# Patient Record
Sex: Female | Born: 1945 | Race: Black or African American | Hispanic: No | Marital: Single | Smoking: Former smoker
Health system: Southern US, Community
[De-identification: ages and names within clinical notes are randomized; demographics above are authoritative.]

## PROBLEM LIST (undated history)

## (undated) DIAGNOSIS — C3491 Malignant neoplasm of unspecified part of right bronchus or lung: Secondary | ICD-10-CM

## (undated) DIAGNOSIS — E7800 Pure hypercholesterolemia, unspecified: Secondary | ICD-10-CM

## (undated) DIAGNOSIS — Z5189 Encounter for other specified aftercare: Secondary | ICD-10-CM

## (undated) DIAGNOSIS — K219 Gastro-esophageal reflux disease without esophagitis: Secondary | ICD-10-CM

## (undated) DIAGNOSIS — I10 Essential (primary) hypertension: Secondary | ICD-10-CM

## (undated) DIAGNOSIS — Z803 Family history of malignant neoplasm of breast: Secondary | ICD-10-CM

## (undated) DIAGNOSIS — K635 Polyp of colon: Secondary | ICD-10-CM

## (undated) DIAGNOSIS — I5041 Acute combined systolic (congestive) and diastolic (congestive) heart failure: Secondary | ICD-10-CM

## (undated) DIAGNOSIS — C801 Malignant (primary) neoplasm, unspecified: Secondary | ICD-10-CM

## (undated) DIAGNOSIS — I671 Cerebral aneurysm, nonruptured: Secondary | ICD-10-CM

## (undated) DIAGNOSIS — N61 Mastitis without abscess: Secondary | ICD-10-CM

## (undated) DIAGNOSIS — Z950 Presence of cardiac pacemaker: Secondary | ICD-10-CM

## (undated) DIAGNOSIS — D126 Benign neoplasm of colon, unspecified: Secondary | ICD-10-CM

## (undated) DIAGNOSIS — F09 Unspecified mental disorder due to known physiological condition: Secondary | ICD-10-CM

## (undated) DIAGNOSIS — M1990 Unspecified osteoarthritis, unspecified site: Secondary | ICD-10-CM

## (undated) DIAGNOSIS — J449 Chronic obstructive pulmonary disease, unspecified: Secondary | ICD-10-CM

## (undated) DIAGNOSIS — F0789 Other personality and behavioral disorders due to known physiological condition: Secondary | ICD-10-CM

## (undated) DIAGNOSIS — I214 Non-ST elevation (NSTEMI) myocardial infarction: Secondary | ICD-10-CM

## (undated) DIAGNOSIS — F17200 Nicotine dependence, unspecified, uncomplicated: Secondary | ICD-10-CM

## (undated) DIAGNOSIS — N189 Chronic kidney disease, unspecified: Secondary | ICD-10-CM

## (undated) DIAGNOSIS — F0390 Unspecified dementia without behavioral disturbance: Secondary | ICD-10-CM

## (undated) DIAGNOSIS — M5431 Sciatica, right side: Secondary | ICD-10-CM

## (undated) DIAGNOSIS — N184 Chronic kidney disease, stage 4 (severe): Secondary | ICD-10-CM

## (undated) DIAGNOSIS — J302 Other seasonal allergic rhinitis: Secondary | ICD-10-CM

## (undated) DIAGNOSIS — E559 Vitamin D deficiency, unspecified: Secondary | ICD-10-CM

## (undated) HISTORY — DX: Unspecified mental disorder due to known physiological condition: F09

## (undated) HISTORY — DX: Essential (primary) hypertension: I10

## (undated) HISTORY — DX: Nicotine dependence, unspecified, uncomplicated: F17.200

## (undated) HISTORY — DX: Pure hypercholesterolemia, unspecified: E78.00

## (undated) HISTORY — DX: Non-ST elevation (NSTEMI) myocardial infarction: I21.4

## (undated) HISTORY — DX: Family history of malignant neoplasm of breast: Z80.3

## (undated) HISTORY — DX: Chronic kidney disease, stage 4 (severe): N18.4

## (undated) HISTORY — DX: Sciatica, right side: M54.31

## (undated) HISTORY — DX: Malignant neoplasm of unspecified part of right bronchus or lung: C34.91

## (undated) HISTORY — DX: Cerebral aneurysm, nonruptured: I67.1

## (undated) HISTORY — DX: Other personality and behavioral disorders due to known physiological condition: F07.89

## (undated) HISTORY — DX: Other seasonal allergic rhinitis: J30.2

## (undated) HISTORY — DX: Mastitis without abscess: N61.0

## (undated) HISTORY — DX: Benign neoplasm of colon, unspecified: D12.6

## (undated) HISTORY — DX: Acute combined systolic (congestive) and diastolic (congestive) heart failure: I50.41

## (undated) HISTORY — DX: Unspecified dementia without behavioral disturbance: F03.90

## (undated) HISTORY — PX: BREAST SURGERY: SHX581

## (undated) HISTORY — DX: Polyp of colon: K63.5

## (undated) HISTORY — DX: Vitamin D deficiency, unspecified: E55.9

## (undated) HISTORY — DX: Gastro-esophageal reflux disease without esophagitis: K21.9

---

## 1999-09-15 ENCOUNTER — Other Ambulatory Visit: Admission: RE | Admit: 1999-09-15 | Discharge: 1999-09-15 | Payer: Self-pay | Admitting: Internal Medicine

## 1999-12-11 ENCOUNTER — Encounter: Payer: Self-pay | Admitting: Internal Medicine

## 1999-12-11 ENCOUNTER — Encounter: Admission: RE | Admit: 1999-12-11 | Discharge: 1999-12-11 | Payer: Self-pay | Admitting: Internal Medicine

## 2000-09-16 ENCOUNTER — Other Ambulatory Visit: Admission: RE | Admit: 2000-09-16 | Discharge: 2000-09-16 | Payer: Self-pay | Admitting: Internal Medicine

## 2000-12-11 ENCOUNTER — Encounter: Payer: Self-pay | Admitting: Internal Medicine

## 2000-12-11 ENCOUNTER — Encounter: Admission: RE | Admit: 2000-12-11 | Discharge: 2000-12-11 | Payer: Self-pay | Admitting: Internal Medicine

## 2001-12-12 ENCOUNTER — Encounter: Payer: Self-pay | Admitting: Internal Medicine

## 2001-12-12 ENCOUNTER — Encounter: Admission: RE | Admit: 2001-12-12 | Discharge: 2001-12-12 | Payer: Self-pay | Admitting: Internal Medicine

## 2002-12-14 ENCOUNTER — Encounter: Payer: Self-pay | Admitting: Internal Medicine

## 2002-12-14 ENCOUNTER — Encounter: Admission: RE | Admit: 2002-12-14 | Discharge: 2002-12-14 | Payer: Self-pay | Admitting: Internal Medicine

## 2003-10-06 DIAGNOSIS — N61 Mastitis without abscess: Secondary | ICD-10-CM

## 2003-10-06 HISTORY — DX: Mastitis without abscess: N61.0

## 2003-10-18 ENCOUNTER — Encounter: Admission: RE | Admit: 2003-10-18 | Discharge: 2003-10-18 | Payer: Self-pay | Admitting: Internal Medicine

## 2003-11-01 ENCOUNTER — Encounter: Admission: RE | Admit: 2003-11-01 | Discharge: 2003-11-01 | Payer: Self-pay | Admitting: Internal Medicine

## 2003-11-11 ENCOUNTER — Encounter: Admission: RE | Admit: 2003-11-11 | Discharge: 2003-11-11 | Payer: Self-pay | Admitting: Internal Medicine

## 2004-12-15 ENCOUNTER — Encounter: Admission: RE | Admit: 2004-12-15 | Discharge: 2004-12-15 | Payer: Self-pay | Admitting: Internal Medicine

## 2004-12-28 ENCOUNTER — Encounter: Admission: RE | Admit: 2004-12-28 | Discharge: 2004-12-28 | Payer: Self-pay | Admitting: Internal Medicine

## 2005-12-14 ENCOUNTER — Other Ambulatory Visit: Admission: RE | Admit: 2005-12-14 | Discharge: 2005-12-14 | Payer: Self-pay | Admitting: Internal Medicine

## 2006-01-14 ENCOUNTER — Encounter: Admission: RE | Admit: 2006-01-14 | Discharge: 2006-01-14 | Payer: Self-pay | Admitting: Internal Medicine

## 2007-02-24 ENCOUNTER — Other Ambulatory Visit: Admission: RE | Admit: 2007-02-24 | Discharge: 2007-02-24 | Payer: Self-pay | Admitting: Internal Medicine

## 2007-09-03 ENCOUNTER — Encounter: Admission: RE | Admit: 2007-09-03 | Payer: Self-pay | Admitting: Internal Medicine

## 2008-09-06 ENCOUNTER — Encounter: Admission: RE | Admit: 2008-09-06 | Discharge: 2008-09-06 | Payer: Self-pay | Admitting: Internal Medicine

## 2009-03-02 ENCOUNTER — Other Ambulatory Visit: Admission: RE | Admit: 2009-03-02 | Discharge: 2009-03-02 | Payer: Self-pay | Admitting: Internal Medicine

## 2009-09-07 ENCOUNTER — Encounter: Admission: RE | Admit: 2009-09-07 | Discharge: 2009-09-07 | Payer: Self-pay | Admitting: Internal Medicine

## 2009-09-13 ENCOUNTER — Encounter: Admission: RE | Admit: 2009-09-13 | Discharge: 2009-09-13 | Payer: Self-pay | Admitting: Internal Medicine

## 2010-06-12 ENCOUNTER — Encounter: Admission: RE | Admit: 2010-06-12 | Discharge: 2010-06-12 | Payer: Self-pay | Admitting: Gastroenterology

## 2012-02-26 ENCOUNTER — Other Ambulatory Visit: Payer: Self-pay | Admitting: Internal Medicine

## 2012-02-26 ENCOUNTER — Other Ambulatory Visit (HOSPITAL_COMMUNITY)
Admission: RE | Admit: 2012-02-26 | Discharge: 2012-02-26 | Disposition: A | Discharge status: Home / Self Care | Payer: Federal, State, Local not specified - PPO | Source: Ambulatory Visit | Attending: Internal Medicine | Admitting: Internal Medicine

## 2012-02-26 DIAGNOSIS — F17200 Nicotine dependence, unspecified, uncomplicated: Secondary | ICD-10-CM | POA: Diagnosis not present

## 2012-02-26 DIAGNOSIS — Z0000 Encounter for general adult medical examination without abnormal findings: Secondary | ICD-10-CM | POA: Diagnosis not present

## 2012-02-26 DIAGNOSIS — Z86010 Personal history of colonic polyps: Secondary | ICD-10-CM | POA: Diagnosis not present

## 2012-02-26 DIAGNOSIS — E7800 Pure hypercholesterolemia, unspecified: Secondary | ICD-10-CM | POA: Diagnosis not present

## 2012-02-26 DIAGNOSIS — J309 Allergic rhinitis, unspecified: Secondary | ICD-10-CM | POA: Diagnosis not present

## 2012-02-26 DIAGNOSIS — E559 Vitamin D deficiency, unspecified: Secondary | ICD-10-CM | POA: Diagnosis not present

## 2012-02-26 DIAGNOSIS — Z01419 Encounter for gynecological examination (general) (routine) without abnormal findings: Secondary | ICD-10-CM | POA: Insufficient documentation

## 2012-02-26 DIAGNOSIS — Z79899 Other long term (current) drug therapy: Secondary | ICD-10-CM | POA: Diagnosis not present

## 2012-02-26 DIAGNOSIS — Z1159 Encounter for screening for other viral diseases: Secondary | ICD-10-CM | POA: Insufficient documentation

## 2012-02-26 DIAGNOSIS — I10 Essential (primary) hypertension: Secondary | ICD-10-CM | POA: Diagnosis not present

## 2013-03-03 DIAGNOSIS — J309 Allergic rhinitis, unspecified: Secondary | ICD-10-CM | POA: Diagnosis not present

## 2013-03-03 DIAGNOSIS — E559 Vitamin D deficiency, unspecified: Secondary | ICD-10-CM | POA: Diagnosis not present

## 2013-03-03 DIAGNOSIS — E7800 Pure hypercholesterolemia, unspecified: Secondary | ICD-10-CM | POA: Diagnosis not present

## 2013-03-03 DIAGNOSIS — F17200 Nicotine dependence, unspecified, uncomplicated: Secondary | ICD-10-CM | POA: Diagnosis not present

## 2013-03-03 DIAGNOSIS — Z0000 Encounter for general adult medical examination without abnormal findings: Secondary | ICD-10-CM | POA: Diagnosis not present

## 2013-03-03 DIAGNOSIS — Z1331 Encounter for screening for depression: Secondary | ICD-10-CM | POA: Diagnosis not present

## 2013-03-03 DIAGNOSIS — Z86010 Personal history of colonic polyps: Secondary | ICD-10-CM | POA: Diagnosis not present

## 2013-03-03 DIAGNOSIS — Z79899 Other long term (current) drug therapy: Secondary | ICD-10-CM | POA: Diagnosis not present

## 2013-03-03 DIAGNOSIS — I10 Essential (primary) hypertension: Secondary | ICD-10-CM | POA: Diagnosis not present

## 2013-05-06 DIAGNOSIS — M25559 Pain in unspecified hip: Secondary | ICD-10-CM | POA: Diagnosis not present

## 2014-03-09 ENCOUNTER — Other Ambulatory Visit: Payer: Self-pay | Admitting: Internal Medicine

## 2014-03-09 ENCOUNTER — Ambulatory Visit
Admission: RE | Admit: 2014-03-09 | Discharge: 2014-03-09 | Disposition: A | Discharge status: Home / Self Care | Payer: Medicare Other | Source: Ambulatory Visit | Attending: Internal Medicine | Admitting: Internal Medicine

## 2014-03-09 DIAGNOSIS — J988 Other specified respiratory disorders: Secondary | ICD-10-CM | POA: Diagnosis not present

## 2014-03-09 DIAGNOSIS — Z79899 Other long term (current) drug therapy: Secondary | ICD-10-CM | POA: Diagnosis not present

## 2014-03-09 DIAGNOSIS — F17200 Nicotine dependence, unspecified, uncomplicated: Secondary | ICD-10-CM

## 2014-03-09 DIAGNOSIS — J309 Allergic rhinitis, unspecified: Secondary | ICD-10-CM | POA: Diagnosis not present

## 2014-03-09 DIAGNOSIS — Z86010 Personal history of colonic polyps: Secondary | ICD-10-CM | POA: Diagnosis not present

## 2014-03-09 DIAGNOSIS — Z1331 Encounter for screening for depression: Secondary | ICD-10-CM | POA: Diagnosis not present

## 2014-03-09 DIAGNOSIS — I10 Essential (primary) hypertension: Secondary | ICD-10-CM | POA: Diagnosis not present

## 2014-03-09 DIAGNOSIS — E559 Vitamin D deficiency, unspecified: Secondary | ICD-10-CM | POA: Diagnosis not present

## 2014-03-09 DIAGNOSIS — J398 Other specified diseases of upper respiratory tract: Secondary | ICD-10-CM | POA: Diagnosis not present

## 2014-03-09 DIAGNOSIS — Z0000 Encounter for general adult medical examination without abnormal findings: Secondary | ICD-10-CM | POA: Diagnosis not present

## 2014-03-09 DIAGNOSIS — E7800 Pure hypercholesterolemia, unspecified: Secondary | ICD-10-CM | POA: Diagnosis not present

## 2014-06-16 DIAGNOSIS — F17200 Nicotine dependence, unspecified, uncomplicated: Secondary | ICD-10-CM | POA: Diagnosis not present

## 2014-06-16 DIAGNOSIS — I10 Essential (primary) hypertension: Secondary | ICD-10-CM | POA: Diagnosis not present

## 2014-06-16 DIAGNOSIS — N183 Chronic kidney disease, stage 3 unspecified: Secondary | ICD-10-CM | POA: Diagnosis not present

## 2014-06-16 DIAGNOSIS — E7800 Pure hypercholesterolemia, unspecified: Secondary | ICD-10-CM | POA: Diagnosis not present

## 2014-06-18 ENCOUNTER — Other Ambulatory Visit: Payer: Self-pay | Admitting: Internal Medicine

## 2014-06-18 DIAGNOSIS — N183 Chronic kidney disease, stage 3 unspecified: Secondary | ICD-10-CM

## 2014-06-23 ENCOUNTER — Ambulatory Visit
Admission: RE | Admit: 2014-06-23 | Discharge: 2014-06-23 | Disposition: A | Discharge status: Home / Self Care | Payer: Medicare Other | Source: Ambulatory Visit | Attending: Internal Medicine | Admitting: Internal Medicine

## 2014-06-23 ENCOUNTER — Encounter (INDEPENDENT_AMBULATORY_CARE_PROVIDER_SITE_OTHER): Payer: Self-pay

## 2014-06-23 DIAGNOSIS — N183 Chronic kidney disease, stage 3 unspecified: Secondary | ICD-10-CM

## 2014-06-23 DIAGNOSIS — N281 Cyst of kidney, acquired: Secondary | ICD-10-CM | POA: Diagnosis not present

## 2014-07-20 DIAGNOSIS — E119 Type 2 diabetes mellitus without complications: Secondary | ICD-10-CM | POA: Diagnosis not present

## 2014-07-20 DIAGNOSIS — R809 Proteinuria, unspecified: Secondary | ICD-10-CM | POA: Diagnosis not present

## 2014-07-20 DIAGNOSIS — N184 Chronic kidney disease, stage 4 (severe): Secondary | ICD-10-CM | POA: Diagnosis not present

## 2014-07-20 DIAGNOSIS — M109 Gout, unspecified: Secondary | ICD-10-CM | POA: Diagnosis not present

## 2014-07-20 DIAGNOSIS — D649 Anemia, unspecified: Secondary | ICD-10-CM | POA: Diagnosis not present

## 2014-07-20 DIAGNOSIS — I10 Essential (primary) hypertension: Secondary | ICD-10-CM | POA: Diagnosis not present

## 2014-07-20 DIAGNOSIS — R300 Dysuria: Secondary | ICD-10-CM | POA: Diagnosis not present

## 2014-07-20 DIAGNOSIS — N189 Chronic kidney disease, unspecified: Secondary | ICD-10-CM | POA: Diagnosis not present

## 2014-07-29 DIAGNOSIS — I10 Essential (primary) hypertension: Secondary | ICD-10-CM | POA: Diagnosis not present

## 2014-09-01 DIAGNOSIS — N184 Chronic kidney disease, stage 4 (severe): Secondary | ICD-10-CM | POA: Diagnosis not present

## 2014-09-01 DIAGNOSIS — Z23 Encounter for immunization: Secondary | ICD-10-CM | POA: Diagnosis not present

## 2014-09-01 DIAGNOSIS — N189 Chronic kidney disease, unspecified: Secondary | ICD-10-CM | POA: Diagnosis not present

## 2014-09-01 DIAGNOSIS — M109 Gout, unspecified: Secondary | ICD-10-CM | POA: Diagnosis not present

## 2014-09-01 DIAGNOSIS — D649 Anemia, unspecified: Secondary | ICD-10-CM | POA: Diagnosis not present

## 2014-09-01 DIAGNOSIS — I10 Essential (primary) hypertension: Secondary | ICD-10-CM | POA: Diagnosis not present

## 2014-09-01 DIAGNOSIS — E780 Pure hypercholesterolemia: Secondary | ICD-10-CM | POA: Diagnosis not present

## 2014-09-16 DIAGNOSIS — F17211 Nicotine dependence, cigarettes, in remission: Secondary | ICD-10-CM | POA: Diagnosis not present

## 2014-09-16 DIAGNOSIS — N183 Chronic kidney disease, stage 3 (moderate): Secondary | ICD-10-CM | POA: Diagnosis not present

## 2014-09-16 DIAGNOSIS — I10 Essential (primary) hypertension: Secondary | ICD-10-CM | POA: Diagnosis not present

## 2014-09-16 DIAGNOSIS — E780 Pure hypercholesterolemia: Secondary | ICD-10-CM | POA: Diagnosis not present

## 2014-09-17 DIAGNOSIS — E780 Pure hypercholesterolemia: Secondary | ICD-10-CM | POA: Diagnosis not present

## 2014-11-10 DIAGNOSIS — Z5181 Encounter for therapeutic drug level monitoring: Secondary | ICD-10-CM | POA: Diagnosis not present

## 2015-03-23 ENCOUNTER — Telehealth: Payer: Self-pay | Admitting: Acute Care

## 2015-03-23 ENCOUNTER — Encounter: Payer: Self-pay | Admitting: Acute Care

## 2015-03-23 DIAGNOSIS — Z23 Encounter for immunization: Secondary | ICD-10-CM | POA: Diagnosis not present

## 2015-03-23 DIAGNOSIS — F17211 Nicotine dependence, cigarettes, in remission: Secondary | ICD-10-CM | POA: Diagnosis not present

## 2015-03-23 DIAGNOSIS — I10 Essential (primary) hypertension: Secondary | ICD-10-CM | POA: Diagnosis not present

## 2015-03-23 DIAGNOSIS — E782 Mixed hyperlipidemia: Secondary | ICD-10-CM | POA: Diagnosis not present

## 2015-03-23 DIAGNOSIS — N183 Chronic kidney disease, stage 3 (moderate): Secondary | ICD-10-CM | POA: Diagnosis not present

## 2015-03-23 DIAGNOSIS — E559 Vitamin D deficiency, unspecified: Secondary | ICD-10-CM | POA: Diagnosis not present

## 2015-03-23 DIAGNOSIS — Z79899 Other long term (current) drug therapy: Secondary | ICD-10-CM | POA: Diagnosis not present

## 2015-03-23 DIAGNOSIS — Z0001 Encounter for general adult medical examination with abnormal findings: Secondary | ICD-10-CM | POA: Diagnosis not present

## 2015-03-23 DIAGNOSIS — K635 Polyp of colon: Secondary | ICD-10-CM | POA: Diagnosis not present

## 2015-03-23 DIAGNOSIS — Z1389 Encounter for screening for other disorder: Secondary | ICD-10-CM | POA: Diagnosis not present

## 2015-03-23 NOTE — Telephone Encounter (Signed)
Called and left a message for Betty [name] to call me so that we can schedule her LDCT scan. I left my contact information on her answering machine.Referral per [name].

## 2015-03-24 ENCOUNTER — Telehealth: Payer: Self-pay | Admitting: Acute Care

## 2015-03-24 NOTE — Telephone Encounter (Signed)
I have called and left messages on the answering machine for Betty [name] to call me back so that we can get her scheduled for her lung cancer screening both yesterday 03/23/15, and today, 03/24/15. This patient was referred by Dwaine Deter.I will await her return call.

## 2015-03-25 DIAGNOSIS — Z803 Family history of malignant neoplasm of breast: Secondary | ICD-10-CM | POA: Diagnosis not present

## 2015-03-25 DIAGNOSIS — Z1231 Encounter for screening mammogram for malignant neoplasm of breast: Secondary | ICD-10-CM | POA: Diagnosis not present

## 2015-03-28 ENCOUNTER — Other Ambulatory Visit: Payer: Self-pay | Admitting: Acute Care

## 2015-03-28 ENCOUNTER — Encounter: Payer: Self-pay | Admitting: Acute Care

## 2015-03-28 ENCOUNTER — Telehealth: Payer: Self-pay | Admitting: Acute Care

## 2015-03-28 DIAGNOSIS — Z87891 Personal history of nicotine dependence: Secondary | ICD-10-CM

## 2015-03-28 NOTE — Telephone Encounter (Signed)
I have spoken with Betty [name], and we have scheduled her for a shared decision making visit on June 7th, 2016 at 1000 am. She will then go for a CT scan at 11:00 at the [facility].She verbalized understanding of the time and location of the appointments. I have given her my contact information in the event she needs to get in touch with me.

## 2015-03-29 ENCOUNTER — Other Ambulatory Visit: Payer: Self-pay | Admitting: Internal Medicine

## 2015-03-29 DIAGNOSIS — Z86010 Personal history of colonic polyps: Secondary | ICD-10-CM

## 2015-04-01 DIAGNOSIS — R921 Mammographic calcification found on diagnostic imaging of breast: Secondary | ICD-10-CM | POA: Diagnosis not present

## 2015-04-05 ENCOUNTER — Other Ambulatory Visit: Payer: Self-pay | Admitting: Radiology

## 2015-04-05 DIAGNOSIS — D242 Benign neoplasm of left breast: Secondary | ICD-10-CM | POA: Diagnosis not present

## 2015-04-05 DIAGNOSIS — Z0000 Encounter for general adult medical examination without abnormal findings: Secondary | ICD-10-CM | POA: Diagnosis not present

## 2015-04-05 DIAGNOSIS — R920 Mammographic microcalcification found on diagnostic imaging of breast: Secondary | ICD-10-CM | POA: Diagnosis not present

## 2015-04-12 ENCOUNTER — Other Ambulatory Visit: Payer: Self-pay | Admitting: Acute Care

## 2015-04-12 ENCOUNTER — Telehealth: Payer: Self-pay | Admitting: Acute Care

## 2015-04-12 ENCOUNTER — Ambulatory Visit (INDEPENDENT_AMBULATORY_CARE_PROVIDER_SITE_OTHER): Payer: Medicare Other | Admitting: Acute Care

## 2015-04-12 ENCOUNTER — Ambulatory Visit (INDEPENDENT_AMBULATORY_CARE_PROVIDER_SITE_OTHER)
Admission: RE | Admit: 2015-04-12 | Discharge: 2015-04-12 | Disposition: A | Discharge status: Home / Self Care | Payer: Medicare Other | Source: Ambulatory Visit | Attending: Acute Care | Admitting: Acute Care

## 2015-04-12 ENCOUNTER — Encounter: Payer: Self-pay | Admitting: Acute Care

## 2015-04-12 DIAGNOSIS — Z87891 Personal history of nicotine dependence: Secondary | ICD-10-CM | POA: Diagnosis not present

## 2015-04-12 DIAGNOSIS — R918 Other nonspecific abnormal finding of lung field: Secondary | ICD-10-CM

## 2015-04-12 NOTE — Progress Notes (Signed)
Shared Decision Making Visit Lung Cancer Screening Program [phone])   Eligibility:  Age 69 y.o.  Pack Years Smoking History Calculation  (# packs/per year x # years smoked)  Recent History of coughing up blood  no  Unexplained weight loss? no ( >Than 15 pounds within the last 6 months )  Prior History Lung / other cancer no (Diagnosis within the last 5 years already requiring surveillance chest CT Scans).  Smoking Status Current Smoker  Former Smokers: Years since quit: N/A  Quit Date: N/A  Visit Components:  Discussion included one or more decision making aids. yes  Discussion included risk/benefits of screening. yes  Discussion included potential follow up diagnostic testing for abnormal scans. yes  Discussion included meaning and risk of over diagnosis. yes  Discussion included meaning and risk of False Positives. yes  Discussion included meaning of total radiation exposure. yes  Counseling Included:  Importance of adherence to annual lung cancer LDCT screening. yes  Impact of comorbidities on ability to participate in the program. yes  Ability and willingness to under diagnostic treatment. yes  Smoking Cessation Counseling:  Current Smokers:   Discussed importance of smoking cessation. yes  Information about tobacco cessation classes and interventions provided to patient. yes  Patient provided with "ticket" for LDCT Scan. no  Symptomatic Patient. no  Counseling N/A  Diagnosis Code: Tobacco Use Z72.0  Asymptomatic Patient yes  Counseling (Intermediate counseling: > three minutes counseling) [id number]  Former Smokers:   Discussed the importance of maintaining cigarette abstinence.N/A  Diagnosis Code: Personal History of Nicotine Dependence. Q22.979  Information about tobacco cessation classes and interventions provided to patient. N/A  Patient provided with "ticket" for LDCT Scan. N/A  Written Order for Lung Cancer Screening with LDCT placed in  Epic. Yes (CT Chest Lung Cancer Screening Low Dose W/O CM) [id number] Z12.2-Screening of respiratory organs Z87.891-Personal history of nicotine dependence  I spent 20 minutes of face to face time with this patient discussing the risks and benefits of lung cancer screening. The patient is a current smoker and we discussed that the single most powerful action she can take to decrease her risk of lung cancer is to quit smoking.I gave her the " be stronger than your excuses  "card, and told her I would do anything I could to help her achieve this goal when she is ready. She is not ready now, but says that when cigarettes are $50.00 a case she has told herself she will quit then.She has my contact information for when she is ready.We viewed a power point about lung cancer screening and the risks and benefits. We stopped at intervals to discuss points and answer questions as needed. Ms. Gilcrest verbalized that she had no further questions.She does have my card and contact info if she has a question in the future. She is on her way to [place] at [facility] for her scan.She verbalized understanding of both the time and location of the appointment.I told her I will call her with the results of her scan.She verbalized understanding.  Magdalen Spatz, NP

## 2015-04-12 NOTE — Telephone Encounter (Signed)
I have called and spoken with Betty [name]. I told her that there is a suspicious area on her Low Dose CT Scan. She verbalized understanding. We had discussed during the shared decision making visit that often time the next step in diagnosis is a PET scan. I told her I have ordered this scan, and she verbalized understanding. She is scheduled for this Thursday morning 04/14/15 at 07:00. She knows to be there at 6:30 and to have nothing to eat or drink after midnight on 04/13/15. I have told her to go to the radiology department at [facility] and gave her directions. She verbalized understanding.I have discussed this case with Dr. Lamonte Sakai. We will also need PFT's in the near future to evaluate her for surgery if the PET comes back as positive.I called Dr. [name]' office to see if he wanted to be the one to tell the patient there is a suspicious area on her scan, but was told he is out of town until the 14th of the month. Due to the fact this is a 4B nodule, we will proceed in order to provide the patient with the best possible care. Betty [name] has my contact information if she has any further questions or needs.

## 2015-04-14 ENCOUNTER — Telehealth: Payer: Self-pay | Admitting: Acute Care

## 2015-04-14 ENCOUNTER — Other Ambulatory Visit: Payer: Self-pay | Admitting: Acute Care

## 2015-04-14 ENCOUNTER — Ambulatory Visit (HOSPITAL_COMMUNITY)
Admission: RE | Admit: 2015-04-14 | Discharge: 2015-04-14 | Disposition: A | Discharge status: Home / Self Care | Payer: Medicare Other | Source: Ambulatory Visit | Attending: Acute Care | Admitting: Acute Care

## 2015-04-14 DIAGNOSIS — I700 Atherosclerosis of aorta: Secondary | ICD-10-CM | POA: Diagnosis not present

## 2015-04-14 DIAGNOSIS — J439 Emphysema, unspecified: Secondary | ICD-10-CM | POA: Diagnosis not present

## 2015-04-14 DIAGNOSIS — R918 Other nonspecific abnormal finding of lung field: Secondary | ICD-10-CM | POA: Insufficient documentation

## 2015-04-14 DIAGNOSIS — N281 Cyst of kidney, acquired: Secondary | ICD-10-CM | POA: Insufficient documentation

## 2015-04-14 DIAGNOSIS — K8020 Calculus of gallbladder without cholecystitis without obstruction: Secondary | ICD-10-CM | POA: Insufficient documentation

## 2015-04-14 DIAGNOSIS — K449 Diaphragmatic hernia without obstruction or gangrene: Secondary | ICD-10-CM | POA: Insufficient documentation

## 2015-04-14 DIAGNOSIS — R911 Solitary pulmonary nodule: Secondary | ICD-10-CM

## 2015-04-14 DIAGNOSIS — Z87891 Personal history of nicotine dependence: Secondary | ICD-10-CM

## 2015-04-14 LAB — GLUCOSE, CAPILLARY: Glucose-Capillary: 113 mg/dL — ABNORMAL HIGH (ref 65–99)

## 2015-04-14 MED ORDER — FLUDEOXYGLUCOSE F - 18 (FDG) INJECTION
5.8000 | Freq: Once | INTRAVENOUS | Status: AC | PRN
  Administered 2015-04-14: 5.8 via INTRAVENOUS

## 2015-04-14 NOTE — Addendum Note (Signed)
Addended by: Maurice March on: 04/14/2015 04:44 PM   Modules accepted: Orders

## 2015-04-14 NOTE — Telephone Encounter (Signed)
PET scan results are back. I attempted to call Ms. Betty [name] to discuss them with her. I have left a message with my number, asking that she return my call as soon as she gets my message.I did not leave any other information in the message. I will await her return call.

## 2015-04-14 NOTE — Telephone Encounter (Signed)
I spoke with Ms. Betty [name]. I told her that the nodule she has in the Right Lower Lobe of her lung is suspicious for cancer based on the PET scan results.. She verbalized understanding. 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 I explained that the next step in the process is to have pulmonary function tests done to clear her for surgery if needed. I have scheduled her for Tuesday June 14th at 10 am at [facility]. She verbalized understanding of time and location of the appointment. We reviewed that she must not smoke or have caffeine or use any breathing medicines for 4 hours prior to the procedure.( None after 6  Am).She verbalized understanding. I am going to refer her to [facility] for an appointment with then as early as possible to evaluate her for surgical removal of the suspicious node. She understands that I am doing this, and understands that we want to move forward at a quick pace which we believe is in her best interest. I conferred with Dr. Lamonte Sakai on both the CT and PET scan results. I have faxed a copy of the CT report to Betty Finders, MD ( PCP), and will do the same with the PET scan results. [name] has my contact information if she has any questions, she can call me at any time.

## 2015-04-19 ENCOUNTER — Ambulatory Visit (HOSPITAL_COMMUNITY)
Admission: RE | Admit: 2015-04-19 | Discharge: 2015-04-19 | Disposition: A | Discharge status: Home / Self Care | Payer: Medicare Other | Source: Ambulatory Visit | Attending: Acute Care | Admitting: Acute Care

## 2015-04-19 DIAGNOSIS — R911 Solitary pulmonary nodule: Secondary | ICD-10-CM | POA: Diagnosis not present

## 2015-04-19 DIAGNOSIS — Z87891 Personal history of nicotine dependence: Secondary | ICD-10-CM | POA: Insufficient documentation

## 2015-04-19 LAB — PULMONARY FUNCTION TEST
DL/VA % pred: 42 %
DL/VA: 2.02 ml/min/mmHg/L
DLCO unc % pred: 39 %
DLCO unc: 9.21 ml/min/mmHg
FEF 25-75 POST: 1.29 L/s
FEF 25-75 Pre: 1.34 L/sec
FEF2575-%CHANGE-POST: -4 %
FEF2575-%Pred-Post: 78 %
FEF2575-%Pred-Pre: 82 %
FEV1-%Change-Post: 0 %
FEV1-%Pred-Post: 116 %
FEV1-%Pred-Pre: 115 %
FEV1-Post: 2.08 L
FEV1-Pre: 2.06 L
FEV1FVC-%Change-Post: -2 %
FEV1FVC-%Pred-Pre: 91 %
FEV6-%Change-Post: 2 %
FEV6-%Pred-Post: 135 %
FEV6-%Pred-Pre: 131 %
FEV6-Post: 2.98 L
FEV6-Pre: 2.89 L
FEV6FVC-%Change-Post: -1 %
FEV6FVC-%Pred-Post: 101 %
FEV6FVC-%Pred-Pre: 103 %
FVC-%Change-Post: 3 %
FVC-%Pred-Post: 132 %
FVC-%Pred-Pre: 127 %
FVC-PRE: 2.93 L
FVC-Post: 3.04 L
POST FEV1/FVC RATIO: 68 %
PRE FEV1/FVC RATIO: 70 %
Post FEV6/FVC ratio: 98 %
Pre FEV6/FVC Ratio: 99 %
RV % pred: 87 %
RV: 1.9 L
TLC % pred: 99 %
TLC: 4.94 L

## 2015-04-19 MED ORDER — ALBUTEROL SULFATE (2.5 MG/3ML) 0.083% IN NEBU
2.5000 mg | INHALATION_SOLUTION | Freq: Once | RESPIRATORY_TRACT | Status: AC
  Administered 2015-04-19: 2.5 mg via RESPIRATORY_TRACT

## 2015-04-22 ENCOUNTER — Other Ambulatory Visit: Payer: Self-pay

## 2015-04-22 ENCOUNTER — Institutional Professional Consult (permissible substitution) (INDEPENDENT_AMBULATORY_CARE_PROVIDER_SITE_OTHER): Payer: Medicare Other | Admitting: Thoracic Surgery (Cardiothoracic Vascular Surgery)

## 2015-04-22 VITALS — BP 134/75 | HR 92 | Resp 16 | Ht 63.5 in | Wt 124.0 lb

## 2015-04-22 DIAGNOSIS — R911 Solitary pulmonary nodule: Secondary | ICD-10-CM | POA: Diagnosis not present

## 2015-04-22 DIAGNOSIS — D381 Neoplasm of uncertain behavior of trachea, bronchus and lung: Secondary | ICD-10-CM | POA: Diagnosis not present

## 2015-04-22 NOTE — Progress Notes (Signed)
PCP is Henrine Screws, MD Referring Provider is Magdalen Spatz, NP  Chief Complaint  Patient presents with  . Lung Lesion    CT CHEST 04/12/15, ET 04/14/15    HPI: 69 year old woman with a long history of tobacco abuse who presents for evaluation of a lung nodule found on low-dose screening CT scan.  Mrs. Kittler is a 68 year old woman with a history of hypertension, hyperlipidemia, reflux, and tobacco abuse. She started smoking at age 61 and has smoked about 3/4 of a pack of cigarettes per day since then. She recently saw Dr. Inda Merlin who recommended a low-dose screening CT. That was done on 04/14/2015 and showed a 2.7 x 1.6 cm spiculated nodule in the right lower lobe adjacent to the spine. A PET CT was done and the lesion was hypermetabolic with an SUV of 8.7. There was an irregular area in the posterior aspect of the right upper lobe that was groundglass in nature and not a distinct nodule. There was some mild hypermetabolism in that area with an SUV of 1.5. There was no evidence of regional or distant metastatic disease.  Mrs. Whitehair says that she feels well. She has an occasional dry cough, but nothing out of the ordinary. She gets short of breath with heavy exertion, but can walk up a flight of stairs without difficulty. She denies any change in appetite or weight loss. She lives alone and is fully independent. She has been retired for 14 years. She's not had any unusual headaches or visual changes. She denies any chest pain, pressure, or tightness with exertion.  Zubrod Score: At the time of surgery this patient's most appropriate activity status/level should be described as: '[x]'$     0    Normal activity, no symptoms '[]'$     1    Restricted in physical strenuous activity but ambulatory, able to do out light work '[]'$     2    Ambulatory and capable of self care, unable to do work activities, up and about >50 % of waking hours                              '[]'$     3    Only limited self care, in bed  greater than 50% of waking hours '[]'$     4    Completely disabled, no self care, confined to bed or chair '[]'$     5    Moribund      Past Medical History  Diagnosis Date  . Hypertension   . Hypercholesterolemia   . Tobacco dependence   . GERD (gastroesophageal reflux disease)   . Sciatica of right side   . Seasonal allergic rhinitis   . Acute mastitis of right breast     10/2003  . Family history of breast cancer   . Colon polyps   . Tubular adenoma of colon   . Vitamin D deficiency     No past surgical history on file.  Family History  Problem Relation Age of Onset  . Cancer Father   . Cancer Sister     Social History History  Substance Use Topics  . Smoking status: Current Every Day Smoker -- 0.75 packs/day for 47 years    Types: Cigarettes  . Smokeless tobacco: Not on file     Comment: States she will quit when cigarettes cost $50.00 per carton  . Alcohol Use: Not on file    Current Outpatient Prescriptions  Medication Sig Dispense Refill  . loratadine (CLARITIN) 10 MG tablet Take 10 mg by mouth daily as needed.     . Multiple Vitamins-Minerals (MULTIVITAMIN PO) Take 1 tablet by mouth daily.    Marland Kitchen aspirin 81 MG tablet Take 81 mg by mouth daily.    . cholecalciferol (VITAMIN D) 1000 UNITS tablet Take 1,000 Units by mouth daily.    Marland Kitchen lisinopril-hydrochlorothiazide (PRINZIDE,ZESTORETIC) 20-12.5 MG per tablet Take 1 tablet by mouth daily.     . metoprolol succinate (TOPROL-XL) 50 MG 24 hr tablet Take 50 mg by mouth daily.     . simvastatin (ZOCOR) 20 MG tablet Take 20 mg by mouth daily.     No current facility-administered medications for this visit.    Allergies  Allergen Reactions  . Sulfa Antibiotics Itching    Review of Systems  Constitutional: Negative for fever, chills, activity change, appetite change and unexpected weight change.  Eyes: Negative for visual disturbance.  Respiratory: Positive for cough (nonproductive) and shortness of breath (with heavy  exertion). Negative for chest tightness and wheezing.   Cardiovascular: Negative for chest pain, palpitations and leg swelling.  Gastrointestinal: Negative.  Negative for abdominal pain and blood in stool.  Endocrine: Negative for polydipsia and polyphagia.  Genitourinary: Negative for hematuria.       "kidney disease" due to BP meds  Neurological: Negative for dizziness, seizures and syncope.  Hematological: Negative for adenopathy. Does not bruise/bleed easily.  All other systems reviewed and are negative.   BP 134/75 mmHg  Pulse 92  Resp 16  Ht 5' 3.5" (1.613 m)  Wt 124 lb (56.246 kg)  BMI 21.62 kg/m2  SpO2 98% Physical Exam  Constitutional: She is oriented to person, place, and time. She appears well-developed and well-nourished. No distress.  HENT:  Head: Normocephalic and atraumatic.  Eyes: EOM are normal. Pupils are equal, round, and reactive to light.  Neck: Neck supple. No thyromegaly present.  Left carotid bruit  Cardiovascular: Normal rate, regular rhythm, normal heart sounds and intact distal pulses.  Exam reveals no gallop and no friction rub.   No murmur heard. Pulmonary/Chest: Effort normal and breath sounds normal. She has no wheezes. She has no rales.  Abdominal: Soft. There is no tenderness.  Musculoskeletal: Normal range of motion. She exhibits no edema.  Lymphadenopathy:    She has no cervical adenopathy.  Neurological: She is alert and oriented to person, place, and time. No cranial nerve deficit.  Motor intact  Skin: Skin is warm and dry.  Psychiatric: She has a normal mood and affect.  Vitals reviewed.    Diagnostic Tests: CT CHEST WITHOUT CONTRAST  TECHNIQUE: Multidetector CT imaging of the chest was performed following the standard protocol without IV contrast.  COMPARISON: No priors.  FINDINGS: Mediastinum/Lymph Nodes: Heart size is normal. There is no significant pericardial fluid, thickening or pericardial calcification. There is  atherosclerosis of the thoracic aorta, the great vessels of the mediastinum and the coronary arteries, including calcified atherosclerotic plaque in the left anterior descending, left circumflex and the right coronary arteries. No pathologically enlarged mediastinal or hilar lymph nodes. Please note that accurate exclusion of hilar adenopathy is limited on noncontrast CT scans. Small hiatal hernia. No axillary lymphadenopathy.  Lungs/Pleura: In the medial aspect of the right lower lobe (image 266 of series 5) there is a macrolobulated and spiculated nodule that has a volume derived mean diameter of 20.8 mm, concerning for potential neoplasm. Several other smaller pulmonary nodules are seen scattered throughout the  lungs bilaterally, some of which are calcified and compatible with granulomas, while others of which are nonspecific. No acute consolidative airspace disease. No pleural effusions. Mild diffuse bronchial wall thickening with mild centrilobular and paraseptal emphysema mild bilateral apical pleural parenchymal thickening, most compatible with chronic post infectious or inflammatory scarring.  Upper Abdomen: Multiple low-attenuation lesions in the visualized kidneys, incompletely characterized on today's noncontrast CT examination, largest of which is exophytic measuring 2.8 cm in the upper pole of the left kidney; favored to represent cysts. In addition, there is a 6 mm hyperdense lesion upper pole of the right kidney, which is also incompletely characterized, but favored to represent a proteinaceous or hemorrhagic cyst. Small amount of high attenuation material layering dependently in the gallbladder may reflect tiny partially calcified gallstones or biliary sludge balls. Atherosclerosis.  Musculoskeletal/Soft Tissues: There are no aggressive appearing lytic or blastic lesions noted in the visualized portions of the skeleton.  IMPRESSION: 1. Lung-RADS Category 4BS,  suspicious. Additional imaging evaluation or consultation with pulmonary medicine or thoracic surgery recommended. 2. The "S" modifier above refers to potentially clinically significant non lung cancer related findings. Specifically, there is extensive atherosclerosis, including 3 vessel coronary artery disease. Please note that although the presence of coronary artery calcium documents the presence of coronary artery disease, the severity of this disease and any potential stenosis cannot be assessed on this non-gated CT examination. Assessment for potential risk factor modification, dietary therapy or pharmacologic therapy may be warranted, if clinically indicated. 3. Mild diffuse bronchial thickening with mild centrilobular and paraseptal emphysema; imaging findings suggestive of underlying COPD. 4. Additional incidental findings, as above These results were called by telephone at the time of interpretation on 04/12/2015 at 11:24 am to Dr. Eric Form , who verbally acknowledged these results.   Electronically Signed  By: Vinnie Langton M.D.  On: 04/12/2015 11:27  NUCLEAR MEDICINE PET SKULL BASE TO THIGH  TECHNIQUE: 5.8 mCi F-18 FDG was injected intravenously. Full-ring PET imaging was performed from the skull base to thigh after the radiotracer. CT data was obtained and used for attenuation correction and anatomic localization.  FASTING BLOOD GLUCOSE: Value: 133 mg/dl  COMPARISON: Chest CT 04/12/2015  FINDINGS: NECK  No hypermetabolic cervical lymph nodes are identified.There are no lesions of the pharyngeal mucosal space. There is prominent physiologic activity associate with the muscles of phonation. There is also some physiologic activity within the muscles in the suboccipital region.  CHEST  There are no hypermetabolic mediastinal, hilar or axillary lymph nodes. The spiculated right lower lobe nodule is significantly hypermetabolic with an SUV  max of 8.7. This nodule measures 2.7 x 1.6 cm on image number 50 and abuts the spine. No definite pleural involvement identified. There is a small focus of mildly increased metabolic activity posteriorly in the right upper lobe corresponding with an ill-defined parenchymal density on image number 19 which measures approximately 8 mm and is primarily ground-glass. This has an SUV max of 1.5. There is no other suspicious pulmonary metabolic activity. Emphysema and scattered pulmonary nodularity are again noted, similar to recent CT. There is atherosclerosis of the aorta, great vessels and coronary arteries.  ABDOMEN/PELVIS  There is no hypermetabolic activity within the liver, adrenal glands, spleen or pancreas. There is no hypermetabolic nodal activity. Incidental findings include the presence of gallstones and small bilateral renal cysts. There is a small hiatal hernia and atherosclerosis of the aorta and iliac arteries. Within the pelvis, there are multiple partially and densely calcified uterine fibroids without abnormal  metabolic activity.  SKELETON  There is no hypermetabolic activity to suggest osseous metastatic disease.  IMPRESSION: 1. The right lower lobe pulmonary nodule is significantly hypermetabolic consistent with bronchogenic carcinoma. Tissue sampling recommended. Assuming non-small cell histology, this is consistent with stage IA disease (T1bN0M0). 2. Indeterminate lesion involving the posterior right upper lobe demonstrates mildly increased metabolic activity for size and could reflect a focus of inflammation, atypical adenomatous hyperplasia or other small tumor. 3. Incidental findings as above, including atherosclerosis, renal cysts and cholelithiasis.   Electronically Signed  By: Richardean Sale M.D.  On: 04/14/2015 09:06  PULMONARY FUNCTION TESTING  FVC = 2.93 (127%) FEV1 = 2.06 (115%), postbronchodilator 2.08 FEV1/FVC 90% DLCO 39%   I  personally reviewed the low-dose CT and PET CT and concur with the radiologist's findings as noted above.  Impression: Mrs. Dileo is a 69 year old woman with a history of tobacco abuse who has a 2.7 x 1.6 cm spiculated nodule in the right lower lobe noted on low-dose screening CT. This is markedly hypermetabolic on PET CT. It is highly suspicious for a primary bronchogenic carcinoma. It has to be considered lung cancer unless it can be proven otherwise. PFTs do not show any significant flow disruption although there is impaired diffusion capacity at 39%.  I had a long discussion with Mrs. Kleiman and her son. We reviewed the CT and PET. We discussed diagnostic and treatment algorithms. Reviewed treatment options including surgery and radiation.  My recommendation given the highly suspicious nature of the lesion and the fact that she is a good operative candidate would be to proceed with right VATS, wedge resection, possible right lower lobectomy. I described the general nature of the procedure, the need for general anesthesia, the incisions to be used, intraoperative decision making, expected hospital stay, and overall recovery to her. I reviewed the indications, risks, benefits, and alternatives. She understands the risks include, but are not limited to death, MI, DVT, PE, bleeding, possible need for transfusion, infection, prolonged air leak, cardiac arrhythmias, as well as the possibility of unforeseeable complications. We also discussed the use of cryo-analgesia to assist with postoperative pain management.  She wishes to proceed with surgery for definitive diagnosis and treatment.  Plan: Right VATS, wedge resection, possible right lower lobectomy, cryo-analgesia of intercostal nerves on Friday, 04/29/2015  [name], MD [facility] [phone]

## 2015-04-25 ENCOUNTER — Other Ambulatory Visit: Payer: Self-pay

## 2015-04-25 ENCOUNTER — Telehealth: Payer: Self-pay | Admitting: Acute Care

## 2015-04-25 DIAGNOSIS — R911 Solitary pulmonary nodule: Secondary | ICD-10-CM

## 2015-04-25 NOTE — Telephone Encounter (Signed)
I called Betty [name] to let her know I had read the notes re: her appointment with Dr. Roxan Hockey on 04/22/15. I wanted her to know I saw that she was having surgery on 04/29/15, and that if she has any needs or questions before then to please call me. I reinforced to her that we are here to help her through this process. She has all of my contact information, and will call me if she has any needs or questions. I will follow her progress.

## 2015-04-26 NOTE — Pre-Procedure Instructions (Signed)
Betty [name]  04/26/2015      [facility] - [place]), [place] [address] [place] [address] Phone: [phone] Fax: [phone]    Your procedure is scheduled on   Friday  04/29/15  Report to [facility] Admitting at  1000 A.M.  Call this number if you have problems the morning of surgery:  [phone]   Remember:  Do not eat food or drink liquids after midnight.  Take these medicines the morning of surgery with A SIP OF WATER  METOPROLOL (TOPROL)   Do not wear jewelry, make-up or nail polish.  Do not wear lotions, powders, or perfumes.  You may wear deodorant.  Do not shave 48 hours prior to surgery.  Men may shave face and neck.  Do not bring valuables to the hospital.  [facility] is not responsible for any belongings or valuables.  Contacts, dentures or bridgework may not be worn into surgery.  Leave your suitcase in the car.  After surgery it may be brought to your room.  For patients admitted to the hospital, discharge time will be determined by your treatment team.  Patients discharged the day of surgery will not be allowed to drive home.   Name and phone number of your driver:    Special instructions: [place] - Preparing for Surgery  Before surgery, you can play an important role.  Because skin is not sterile, your skin needs to be as free of germs as possible.  You can reduce the number of germs on you skin by washing with CHG (chlorahexidine gluconate) soap before surgery.  CHG is an antiseptic cleaner which kills germs and bonds with the skin to continue killing germs even after washing.  Please DO NOT use if you have an allergy to CHG or antibacterial soaps.  If your skin becomes reddened/irritated stop using the CHG and inform your nurse when you arrive at Short Stay.  Do not shave (including legs and underarms) for at least 48 hours prior to the first CHG shower.  You may shave your  face.  Please follow these instructions carefully:   1.  Shower with CHG Soap the night before surgery and the                                morning of Surgery.  2.  If you choose to wash your hair, wash your hair first as usual with your       normal shampoo.  3.  After you shampoo, rinse your hair and body thoroughly to remove the                      Shampoo.  4.  Use CHG as you would any other liquid soap.  You can apply chg directly       to the skin and wash gently with scrungie or a clean washcloth.  5.  Apply the CHG Soap to your body ONLY FROM THE NECK DOWN.        Do not use on open wounds or open sores.  Avoid contact with your eyes,       ears, mouth and genitals (private parts).  Wash genitals (private parts)       with your normal soap.  6.  Wash thoroughly, paying special attention to the area where your surgery  will be performed.  7.  Thoroughly rinse your body with warm water from the neck down.  8.  DO NOT shower/wash with your normal soap after using and rinsing off       the CHG Soap.  9.  Pat yourself dry with a clean towel.            10.  Wear clean pajamas.            11.  Place clean sheets on your bed the night of your first shower and do not        sleep with pets.  Day of Surgery  Do not apply any lotions/deoderants the morning of surgery.  Please wear clean clothes to the hospital/surgery center.    Please read over the following fact sheets that you were given. Pain Booklet, Coughing and Deep Breathing, Blood Transfusion Information, MRSA Information and Surgical Site Infection Prevention

## 2015-04-27 ENCOUNTER — Encounter (HOSPITAL_COMMUNITY)
Admission: RE | Admit: 2015-04-27 | Discharge: 2015-04-27 | Disposition: A | Discharge status: Home / Self Care | Payer: Medicare Other | Source: Ambulatory Visit | Attending: Thoracic Surgery (Cardiothoracic Vascular Surgery) | Admitting: Thoracic Surgery (Cardiothoracic Vascular Surgery)

## 2015-04-27 ENCOUNTER — Encounter (HOSPITAL_COMMUNITY): Payer: Self-pay

## 2015-04-27 ENCOUNTER — Other Ambulatory Visit: Payer: Self-pay

## 2015-04-27 VITALS — BP 121/54 | HR 78 | Temp 98.6°F | Resp 18 | Ht 63.5 in | Wt 124.4 lb

## 2015-04-27 DIAGNOSIS — Z79899 Other long term (current) drug therapy: Secondary | ICD-10-CM

## 2015-04-27 DIAGNOSIS — Z01818 Encounter for other preprocedural examination: Secondary | ICD-10-CM

## 2015-04-27 DIAGNOSIS — I129 Hypertensive chronic kidney disease with stage 1 through stage 4 chronic kidney disease, or unspecified chronic kidney disease: Secondary | ICD-10-CM | POA: Insufficient documentation

## 2015-04-27 DIAGNOSIS — Z0183 Encounter for blood typing: Secondary | ICD-10-CM

## 2015-04-27 DIAGNOSIS — C3431 Malignant neoplasm of lower lobe, right bronchus or lung: Secondary | ICD-10-CM | POA: Diagnosis not present

## 2015-04-27 DIAGNOSIS — Z7982 Long term (current) use of aspirin: Secondary | ICD-10-CM | POA: Insufficient documentation

## 2015-04-27 DIAGNOSIS — N183 Chronic kidney disease, stage 3 (moderate): Secondary | ICD-10-CM

## 2015-04-27 DIAGNOSIS — R911 Solitary pulmonary nodule: Secondary | ICD-10-CM

## 2015-04-27 DIAGNOSIS — R918 Other nonspecific abnormal finding of lung field: Secondary | ICD-10-CM | POA: Insufficient documentation

## 2015-04-27 DIAGNOSIS — Z01812 Encounter for preprocedural laboratory examination: Secondary | ICD-10-CM

## 2015-04-27 DIAGNOSIS — J449 Chronic obstructive pulmonary disease, unspecified: Secondary | ICD-10-CM

## 2015-04-27 DIAGNOSIS — K219 Gastro-esophageal reflux disease without esophagitis: Secondary | ICD-10-CM | POA: Insufficient documentation

## 2015-04-27 DIAGNOSIS — E780 Pure hypercholesterolemia: Secondary | ICD-10-CM | POA: Insufficient documentation

## 2015-04-27 DIAGNOSIS — F17210 Nicotine dependence, cigarettes, uncomplicated: Secondary | ICD-10-CM | POA: Diagnosis not present

## 2015-04-27 HISTORY — DX: Unspecified osteoarthritis, unspecified site: M19.90

## 2015-04-27 HISTORY — DX: Chronic kidney disease, unspecified: N18.9

## 2015-04-27 HISTORY — DX: Chronic obstructive pulmonary disease, unspecified: J44.9

## 2015-04-27 LAB — URINE MICROSCOPIC-ADD ON

## 2015-04-27 LAB — COMPREHENSIVE METABOLIC PANEL
ALBUMIN: 3.8 g/dL (ref 3.5–5.0)
ALT: 14 U/L (ref 14–54)
AST: 22 U/L (ref 15–41)
Alkaline Phosphatase: 82 U/L (ref 38–126)
Anion gap: 12 (ref 5–15)
BUN: 23 mg/dL — ABNORMAL HIGH (ref 6–20)
CO2: 20 mmol/L — ABNORMAL LOW (ref 22–32)
CREATININE: 2.16 mg/dL — AB (ref 0.44–1.00)
Calcium: 9.5 mg/dL (ref 8.9–10.3)
Chloride: 109 mmol/L (ref 101–111)
GFR calc Af Amer: 25 mL/min — ABNORMAL LOW (ref >60)
GFR, EST NON AFRICAN AMERICAN: 22 mL/min — AB (ref >60)
Glucose, Bld: 108 mg/dL — ABNORMAL HIGH (ref 65–99)
Potassium: 4 mmol/L (ref 3.5–5.1)
Sodium: 141 mmol/L (ref 135–145)
Total Bilirubin: 0.4 mg/dL (ref 0.3–1.2)
Total Protein: 6.8 g/dL (ref 6.5–8.1)

## 2015-04-27 LAB — BLOOD GAS, ARTERIAL
ACID-BASE DEFICIT: 2 mmol/L (ref 0.0–2.0)
BICARBONATE: 22.1 meq/L (ref 20.0–24.0)
Drawn by: 421801
FIO2: 0.21 %
O2 SAT: 98.9 %
PATIENT TEMPERATURE: 98.6
PCO2 ART: 36.3 mmHg (ref 35.0–45.0)
PH ART: 7.401 (ref 7.350–7.450)
TCO2: 23.2 mmol/L (ref 0–100)
pO2, Arterial: 124 mmHg — ABNORMAL HIGH (ref 80.0–100.0)

## 2015-04-27 LAB — URINALYSIS, ROUTINE W REFLEX MICROSCOPIC
BILIRUBIN URINE: NEGATIVE
Glucose, UA: NEGATIVE mg/dL
Hgb urine dipstick: NEGATIVE
Ketones, ur: NEGATIVE mg/dL
Nitrite: NEGATIVE
Protein, ur: NEGATIVE mg/dL
Specific Gravity, Urine: 1.011 (ref 1.005–1.030)
UROBILINOGEN UA: 0.2 mg/dL (ref 0.0–1.0)
pH: 5.5 (ref 5.0–8.0)

## 2015-04-27 LAB — CBC
HEMATOCRIT: 32.9 % — AB (ref 36.0–46.0)
HEMOGLOBIN: 11.3 g/dL — AB (ref 12.0–15.0)
MCH: 31.2 pg (ref 26.0–34.0)
MCHC: 34.3 g/dL (ref 30.0–36.0)
MCV: 90.9 fL (ref 78.0–100.0)
PLATELETS: 348 10*3/uL (ref 150–400)
RBC: 3.62 MIL/uL — ABNORMAL LOW (ref 3.87–5.11)
RDW: 12.7 % (ref 11.5–15.5)
WBC: 10.1 10*3/uL (ref 4.0–10.5)

## 2015-04-27 LAB — TYPE AND SCREEN
ABO/RH(D): A POS
Antibody Screen: NEGATIVE

## 2015-04-27 LAB — PROTIME-INR
INR: 0.95 (ref 0.00–1.49)
Prothrombin Time: 12.9 seconds (ref 11.6–15.2)

## 2015-04-27 LAB — ABO/RH: ABO/RH(D): A POS

## 2015-04-27 LAB — SURGICAL PCR SCREEN
MRSA, PCR: NEGATIVE
Staphylococcus aureus: NEGATIVE

## 2015-04-27 LAB — APTT: aPTT: 27 seconds (ref 24–37)

## 2015-04-27 NOTE — Progress Notes (Addendum)
Saw Dr. Linard Millers more than 5 yrs ago d/t passing out.  She did have a tread mill which she states came out 'normal'. She did tell me that at the time, she wasn't eating or drinking as she should and very infrequently.  No complaints nor repeats at this time.  Called Dr. Leonarda Salon office and left message concerning Betty [name] renal function results.  Patient did not mention any kidney issues while she was in PAT today.

## 2015-04-28 ENCOUNTER — Encounter (HOSPITAL_COMMUNITY): Payer: Self-pay

## 2015-04-28 NOTE — Progress Notes (Signed)
[id number] Chart Review:  Pt is 69 year old female scheduled for R VATS, wedge resection, possible R lower lobectomy, cryo-analgesia of intercostal nerves on 04/29/2015 with Dr. Roxan Hockey.   PCP Dr. Inda Merlin at [place].   PMH includes: HTN, hypercholesterolemia, COPD, GERD. Current smoker, CKD. BMI 22.   Medications include: ASA, lisinopril-hctz, metoprolol, zocor.   Preoperative labs reviewed.  BUN 23, Cr 2.16. Records from PCP's office note pt has stage 3 CKD and sees a nephrologist in [facility]. Most recent renal lab results from 03/23/15 show cr 2.09, BUN 31.  Chest x-ray 04/27/2015 reviewed. No acute abnormality is noted. The known right lower lobe mass lesion is partially visualized on the lateral projection only.  EKG 04/27/2015: NSR. Early transition.  If no changes, I anticipate pt can proceed with surgery as scheduled.   Willeen Cass, FNP-BC [facility] Short Stay Surgical Center/Anesthesiology Phone: [phone] 04/28/2015 11:56 AM

## 2015-04-29 ENCOUNTER — Inpatient Hospital Stay (HOSPITAL_COMMUNITY): Payer: Medicare Other

## 2015-04-29 ENCOUNTER — Inpatient Hospital Stay (HOSPITAL_COMMUNITY): Payer: Medicare Other | Admitting: Anesthesiology

## 2015-04-29 ENCOUNTER — Encounter (HOSPITAL_COMMUNITY)
Admission: RE | Disposition: A | Discharge status: Home / Self Care | Payer: Self-pay | Source: Ambulatory Visit | Attending: Thoracic Surgery (Cardiothoracic Vascular Surgery)

## 2015-04-29 ENCOUNTER — Inpatient Hospital Stay (HOSPITAL_COMMUNITY)
Admission: RE | Admit: 2015-04-29 | Discharge: 2015-05-03 | DRG: 165 | Disposition: A | Discharge status: Home / Self Care | Payer: Medicare Other | Source: Ambulatory Visit | Attending: Thoracic Surgery (Cardiothoracic Vascular Surgery) | Admitting: Thoracic Surgery (Cardiothoracic Vascular Surgery)

## 2015-04-29 ENCOUNTER — Inpatient Hospital Stay (HOSPITAL_COMMUNITY): Payer: Medicare Other | Admitting: Emergency Medicine

## 2015-04-29 ENCOUNTER — Encounter (HOSPITAL_COMMUNITY): Payer: Self-pay | Admitting: *Deleted

## 2015-04-29 DIAGNOSIS — Z79899 Other long term (current) drug therapy: Secondary | ICD-10-CM

## 2015-04-29 DIAGNOSIS — Z882 Allergy status to sulfonamides status: Secondary | ICD-10-CM | POA: Diagnosis not present

## 2015-04-29 DIAGNOSIS — Z7982 Long term (current) use of aspirin: Secondary | ICD-10-CM

## 2015-04-29 DIAGNOSIS — J982 Interstitial emphysema: Secondary | ICD-10-CM | POA: Diagnosis not present

## 2015-04-29 DIAGNOSIS — R911 Solitary pulmonary nodule: Secondary | ICD-10-CM | POA: Diagnosis not present

## 2015-04-29 DIAGNOSIS — R0602 Shortness of breath: Secondary | ICD-10-CM

## 2015-04-29 DIAGNOSIS — N183 Chronic kidney disease, stage 3 (moderate): Secondary | ICD-10-CM | POA: Diagnosis not present

## 2015-04-29 DIAGNOSIS — J984 Other disorders of lung: Secondary | ICD-10-CM | POA: Diagnosis not present

## 2015-04-29 DIAGNOSIS — J449 Chronic obstructive pulmonary disease, unspecified: Secondary | ICD-10-CM | POA: Diagnosis not present

## 2015-04-29 DIAGNOSIS — C3431 Malignant neoplasm of lower lobe, right bronchus or lung: Secondary | ICD-10-CM | POA: Diagnosis not present

## 2015-04-29 DIAGNOSIS — Z452 Encounter for adjustment and management of vascular access device: Secondary | ICD-10-CM | POA: Diagnosis not present

## 2015-04-29 DIAGNOSIS — J9811 Atelectasis: Secondary | ICD-10-CM | POA: Diagnosis not present

## 2015-04-29 DIAGNOSIS — J939 Pneumothorax, unspecified: Secondary | ICD-10-CM

## 2015-04-29 DIAGNOSIS — I129 Hypertensive chronic kidney disease with stage 1 through stage 4 chronic kidney disease, or unspecified chronic kidney disease: Secondary | ICD-10-CM | POA: Diagnosis present

## 2015-04-29 DIAGNOSIS — E785 Hyperlipidemia, unspecified: Secondary | ICD-10-CM | POA: Diagnosis present

## 2015-04-29 DIAGNOSIS — F17210 Nicotine dependence, cigarettes, uncomplicated: Secondary | ICD-10-CM | POA: Diagnosis present

## 2015-04-29 DIAGNOSIS — R0489 Hemorrhage from other sites in respiratory passages: Secondary | ICD-10-CM | POA: Diagnosis not present

## 2015-04-29 DIAGNOSIS — K219 Gastro-esophageal reflux disease without esophagitis: Secondary | ICD-10-CM | POA: Diagnosis not present

## 2015-04-29 DIAGNOSIS — Z902 Acquired absence of lung [part of]: Secondary | ICD-10-CM

## 2015-04-29 DIAGNOSIS — J439 Emphysema, unspecified: Secondary | ICD-10-CM | POA: Diagnosis not present

## 2015-04-29 DIAGNOSIS — Z4682 Encounter for fitting and adjustment of non-vascular catheter: Secondary | ICD-10-CM | POA: Diagnosis not present

## 2015-04-29 HISTORY — PX: LOBECTOMY: SHX5089

## 2015-04-29 HISTORY — PX: CRYO INTERCOSTAL NERVE BLOCK: SHX6522

## 2015-04-29 HISTORY — PX: VIDEO ASSISTED THORACOSCOPY (VATS)/ LOBECTOMY: SHX6169

## 2015-04-29 HISTORY — PX: LYMPH NODE DISSECTION: SHX5087

## 2015-04-29 SURGERY — VIDEO ASSISTED THORACOSCOPY (VATS)/ LOBECTOMY
Anesthesia: General | Site: Chest | Laterality: Right

## 2015-04-29 MED ORDER — HEMOSTATIC AGENTS (NO CHARGE) OPTIME
TOPICAL | Status: DC | PRN
  Administered 2015-04-29: 1 via TOPICAL

## 2015-04-29 MED ORDER — MIDAZOLAM HCL 2 MG/2ML IJ SOLN
INTRAMUSCULAR | Status: AC
  Filled 2015-04-29: qty 2

## 2015-04-29 MED ORDER — HYDROMORPHONE HCL 1 MG/ML IJ SOLN
INTRAMUSCULAR | Status: AC
  Filled 2015-04-29: qty 1

## 2015-04-29 MED ORDER — FENTANYL CITRATE (PF) 100 MCG/2ML IJ SOLN
INTRAMUSCULAR | Status: DC | PRN
  Administered 2015-04-29 (×2): 50 ug via INTRAVENOUS
  Administered 2015-04-29: 25 ug via INTRAVENOUS
  Administered 2015-04-29 (×2): 100 ug via INTRAVENOUS

## 2015-04-29 MED ORDER — PROPOFOL 10 MG/ML IV BOLUS
INTRAVENOUS | Status: AC
  Filled 2015-04-29: qty 20

## 2015-04-29 MED ORDER — DEXTROSE-NACL 5-0.9 % IV SOLN
INTRAVENOUS | Status: DC
  Administered 2015-04-29: 20:00:00 via INTRAVENOUS
  Administered 2015-04-30: 1000 mL via INTRAVENOUS
  Administered 2015-04-30: 06:00:00 via INTRAVENOUS

## 2015-04-29 MED ORDER — NALOXONE HCL 0.4 MG/ML IJ SOLN: 0.4000 mg | INTRAMUSCULAR | Status: DC | PRN

## 2015-04-29 MED ORDER — NEOSTIGMINE METHYLSULFATE 10 MG/10ML IV SOLN
INTRAVENOUS | Status: DC | PRN
  Administered 2015-04-29: 3 mg via INTRAVENOUS

## 2015-04-29 MED ORDER — DIPHENHYDRAMINE HCL 50 MG/ML IJ SOLN: 12.5000 mg | Freq: Four times a day (QID) | INTRAMUSCULAR | Status: DC | PRN

## 2015-04-29 MED ORDER — MIDAZOLAM HCL 2 MG/2ML IJ SOLN
INTRAMUSCULAR | Status: AC
  Administered 2015-04-29: 1 mg via INTRAVENOUS
  Filled 2015-04-29: qty 2

## 2015-04-29 MED ORDER — NEOSTIGMINE METHYLSULFATE 10 MG/10ML IV SOLN
INTRAVENOUS | Status: AC
  Filled 2015-04-29: qty 1

## 2015-04-29 MED ORDER — ASPIRIN 81 MG PO TABS
81.0000 mg | ORAL_TABLET | Freq: Every day | ORAL | Status: DC
  Filled 2015-04-29: qty 1

## 2015-04-29 MED ORDER — LIDOCAINE HCL (CARDIAC) 20 MG/ML IV SOLN
INTRAVENOUS | Status: AC
  Filled 2015-04-29: qty 5

## 2015-04-29 MED ORDER — 0.9 % SODIUM CHLORIDE (POUR BTL) OPTIME
TOPICAL | Status: DC | PRN
  Administered 2015-04-29: 2000 mL

## 2015-04-29 MED ORDER — SODIUM CHLORIDE 0.9 % IV SOLN
INTRAVENOUS | Status: DC | PRN
  Administered 2015-04-29 (×2): via INTRAVENOUS

## 2015-04-29 MED ORDER — PANTOPRAZOLE SODIUM 40 MG PO TBEC
40.0000 mg | DELAYED_RELEASE_TABLET | Freq: Every day | ORAL | Status: DC
  Administered 2015-04-30 – 2015-05-03 (×4): 40 mg via ORAL
  Filled 2015-04-29 (×4): qty 1

## 2015-04-29 MED ORDER — ROCURONIUM BROMIDE 50 MG/5ML IV SOLN
INTRAVENOUS | Status: AC
  Filled 2015-04-29: qty 1

## 2015-04-29 MED ORDER — FENTANYL 10 MCG/ML IV SOLN
INTRAVENOUS | Status: AC
  Administered 2015-04-29: 110 ug via INTRAVENOUS
  Administered 2015-04-30: 30 ug via INTRAVENOUS
  Administered 2015-04-30 (×2): 60 ug via INTRAVENOUS
  Administered 2015-04-30: 20 ug via INTRAVENOUS
  Administered 2015-04-30: 60 ug via INTRAVENOUS
  Administered 2015-04-30: 40 ug via INTRAVENOUS
  Administered 2015-05-01: 20 ug/h via INTRAVENOUS
  Administered 2015-05-01: 20 ug via INTRAVENOUS
  Administered 2015-05-01: 30 ug via INTRAVENOUS
  Administered 2015-05-01: 0 ug via INTRAVENOUS
  Administered 2015-05-01: 45 ug via INTRAVENOUS
  Administered 2015-05-01: 50 ug via INTRAVENOUS
  Administered 2015-05-02: 20 ug via INTRAVENOUS
  Administered 2015-05-02 (×2): 10 ug via INTRAVENOUS
  Filled 2015-04-29 (×2): qty 50

## 2015-04-29 MED ORDER — ACETAMINOPHEN 160 MG/5ML PO SOLN
1000.0000 mg | Freq: Four times a day (QID) | ORAL | Status: DC
  Filled 2015-04-29: qty 40

## 2015-04-29 MED ORDER — ALBUTEROL SULFATE (2.5 MG/3ML) 0.083% IN NEBU
2.5000 mg | INHALATION_SOLUTION | RESPIRATORY_TRACT | Status: DC
  Administered 2015-04-29 – 2015-04-30 (×2): 2.5 mg via RESPIRATORY_TRACT
  Filled 2015-04-29 (×2): qty 3

## 2015-04-29 MED ORDER — DEXTROSE 5 % IV SOLN
INTRAVENOUS | Status: AC
  Filled 2015-04-29: qty 1.5

## 2015-04-29 MED ORDER — HYDROMORPHONE HCL 1 MG/ML IJ SOLN
0.2500 mg | INTRAMUSCULAR | Status: DC | PRN
  Administered 2015-04-29: 0.25 mg via INTRAVENOUS

## 2015-04-29 MED ORDER — FENTANYL CITRATE (PF) 250 MCG/5ML IJ SOLN
INTRAMUSCULAR | Status: AC
  Filled 2015-04-29: qty 5

## 2015-04-29 MED ORDER — GLYCOPYRROLATE 0.2 MG/ML IJ SOLN
INTRAMUSCULAR | Status: DC | PRN
  Administered 2015-04-29: 0.4 mg via INTRAVENOUS

## 2015-04-29 MED ORDER — EPHEDRINE SULFATE 50 MG/ML IJ SOLN
INTRAMUSCULAR | Status: AC
  Filled 2015-04-29: qty 1

## 2015-04-29 MED ORDER — BISACODYL 5 MG PO TBEC
10.0000 mg | DELAYED_RELEASE_TABLET | Freq: Every day | ORAL | Status: DC
  Administered 2015-04-30 – 2015-05-03 (×4): 10 mg via ORAL
  Filled 2015-04-29 (×4): qty 2

## 2015-04-29 MED ORDER — PHENYLEPHRINE HCL 10 MG/ML IJ SOLN
10.0000 mg | INTRAVENOUS | Status: DC | PRN
  Administered 2015-04-29: 10 ug/min via INTRAVENOUS

## 2015-04-29 MED ORDER — METOPROLOL SUCCINATE ER 50 MG PO TB24
50.0000 mg | ORAL_TABLET | Freq: Every day | ORAL | Status: DC
  Administered 2015-05-01 – 2015-05-03 (×3): 50 mg via ORAL
  Filled 2015-04-29 (×4): qty 1

## 2015-04-29 MED ORDER — LIDOCAINE HCL (CARDIAC) 20 MG/ML IV SOLN
INTRAVENOUS | Status: DC | PRN
  Administered 2015-04-29: 40 mg via INTRAVENOUS

## 2015-04-29 MED ORDER — FENTANYL CITRATE (PF) 100 MCG/2ML IJ SOLN
INTRAMUSCULAR | Status: AC
  Administered 2015-04-29: 50 ug
  Filled 2015-04-29: qty 2

## 2015-04-29 MED ORDER — SODIUM CHLORIDE 0.9 % IJ SOLN: 9.0000 mL | INTRAMUSCULAR | Status: DC | PRN

## 2015-04-29 MED ORDER — FENTANYL 10 MCG/ML IV SOLN
INTRAVENOUS | Status: DC
  Administered 2015-04-29: 10 ug via INTRAVENOUS
  Filled 2015-04-29 (×2): qty 50

## 2015-04-29 MED ORDER — DEXTROSE 5 % IV SOLN
1.5000 g | INTRAVENOUS | Status: AC
  Administered 2015-04-29: 1.5 g via INTRAVENOUS

## 2015-04-29 MED ORDER — SENNOSIDES-DOCUSATE SODIUM 8.6-50 MG PO TABS
1.0000 | ORAL_TABLET | Freq: Every day | ORAL | Status: DC
  Administered 2015-04-29 – 2015-05-02 (×4): 1 via ORAL
  Filled 2015-04-29 (×5): qty 1

## 2015-04-29 MED ORDER — PROMETHAZINE HCL 25 MG/ML IJ SOLN: 6.2500 mg | INTRAMUSCULAR | Status: DC | PRN

## 2015-04-29 MED ORDER — DEXTROSE 5 % IV SOLN
1.5000 g | Freq: Two times a day (BID) | INTRAVENOUS | Status: AC
  Administered 2015-04-29 – 2015-04-30 (×2): 1.5 g via INTRAVENOUS
  Filled 2015-04-29 (×2): qty 1.5

## 2015-04-29 MED ORDER — PHENYLEPHRINE 40 MCG/ML (10ML) SYRINGE FOR IV PUSH (FOR BLOOD PRESSURE SUPPORT)
PREFILLED_SYRINGE | INTRAVENOUS | Status: AC
  Filled 2015-04-29: qty 10

## 2015-04-29 MED ORDER — OXYCODONE HCL 5 MG PO TABS: 5.0000 mg | ORAL_TABLET | ORAL | Status: DC | PRN

## 2015-04-29 MED ORDER — DIPHENHYDRAMINE HCL 12.5 MG/5ML PO ELIX
12.5000 mg | ORAL_SOLUTION | Freq: Four times a day (QID) | ORAL | Status: DC | PRN
  Filled 2015-04-29: qty 5

## 2015-04-29 MED ORDER — ONDANSETRON HCL 4 MG/2ML IJ SOLN
INTRAMUSCULAR | Status: AC
  Filled 2015-04-29: qty 2

## 2015-04-29 MED ORDER — PROPOFOL 10 MG/ML IV BOLUS
INTRAVENOUS | Status: DC | PRN
  Administered 2015-04-29: 130 mg via INTRAVENOUS

## 2015-04-29 MED ORDER — DIPHENHYDRAMINE HCL 12.5 MG/5ML PO ELIX: 12.5000 mg | ORAL_SOLUTION | Freq: Four times a day (QID) | ORAL | Status: DC | PRN

## 2015-04-29 MED ORDER — ACETAMINOPHEN 500 MG PO TABS
1000.0000 mg | ORAL_TABLET | Freq: Four times a day (QID) | ORAL | Status: DC
  Administered 2015-04-29 – 2015-05-03 (×15): 1000 mg via ORAL
  Filled 2015-04-29 (×21): qty 2

## 2015-04-29 MED ORDER — TRAMADOL HCL 50 MG PO TABS: 50.0000 mg | ORAL_TABLET | Freq: Four times a day (QID) | ORAL | Status: DC | PRN

## 2015-04-29 MED ORDER — SIMVASTATIN 20 MG PO TABS
20.0000 mg | ORAL_TABLET | ORAL | Status: DC
  Administered 2015-05-02: 20 mg via ORAL
  Filled 2015-04-29 (×3): qty 1

## 2015-04-29 MED ORDER — GLYCOPYRROLATE 0.2 MG/ML IJ SOLN
INTRAMUSCULAR | Status: AC
  Filled 2015-04-29: qty 3

## 2015-04-29 MED ORDER — SODIUM CHLORIDE 0.9 % IV SOLN
INTRAVENOUS | Status: DC
Start: 2015-04-29 — End: 2015-04-29
  Administered 2015-04-29 (×2): via INTRAVENOUS

## 2015-04-29 MED ORDER — ROCURONIUM BROMIDE 100 MG/10ML IV SOLN
INTRAVENOUS | Status: DC | PRN
  Administered 2015-04-29: 10 mg via INTRAVENOUS
  Administered 2015-04-29: 5 mg via INTRAVENOUS
  Administered 2015-04-29: 50 mg via INTRAVENOUS

## 2015-04-29 MED ORDER — MEPERIDINE HCL 25 MG/ML IJ SOLN: 6.2500 mg | INTRAMUSCULAR | Status: DC | PRN

## 2015-04-29 MED ORDER — SODIUM CHLORIDE 0.9 % IJ SOLN
INTRAMUSCULAR | Status: AC
  Filled 2015-04-29: qty 10

## 2015-04-29 MED ORDER — POTASSIUM CHLORIDE 10 MEQ/50ML IV SOLN: 10.0000 meq | Freq: Every day | INTRAVENOUS | Status: DC | PRN

## 2015-04-29 MED ORDER — ONDANSETRON HCL 4 MG/2ML IJ SOLN: 4.0000 mg | Freq: Four times a day (QID) | INTRAMUSCULAR | Status: DC | PRN

## 2015-04-29 MED ORDER — MIDAZOLAM HCL 2 MG/2ML IJ SOLN
INTRAMUSCULAR | Status: AC
  Administered 2015-04-29: 2 mg
  Filled 2015-04-29: qty 2

## 2015-04-29 MED ORDER — GLYCOPYRROLATE 0.2 MG/ML IJ SOLN
INTRAMUSCULAR | Status: AC
  Filled 2015-04-29: qty 2

## 2015-04-29 MED ORDER — ONDANSETRON HCL 4 MG/2ML IJ SOLN
INTRAMUSCULAR | Status: DC | PRN
  Administered 2015-04-29: 4 mg via INTRAVENOUS

## 2015-04-29 SURGICAL SUPPLY — 83 items
APPLIER CLIP ROT 10 11.4 M/L (STAPLE)
CANISTER SUCTION 2500CC (MISCELLANEOUS) ×3 IMPLANT
CATH KIT ON Q 5IN SLV (PAIN MANAGEMENT) IMPLANT
CATH THORACIC 28FR (CATHETERS) ×3 IMPLANT
CATH THORACIC 28FR RT ANG (CATHETERS) IMPLANT
CATH THORACIC 36FR (CATHETERS) IMPLANT
CATH THORACIC 36FR RT ANG (CATHETERS) IMPLANT
CLIP APPLIE ROT 10 11.4 M/L (STAPLE) IMPLANT
CLIP TI MEDIUM 24 (CLIP) ×3 IMPLANT
CLIP TI MEDIUM 6 (CLIP) ×6 IMPLANT
CONN ST 1/4X3/8  BEN (MISCELLANEOUS) ×2
CONN ST 1/4X3/8 BEN (MISCELLANEOUS) ×1 IMPLANT
CONN Y 3/8X3/8X3/8  BEN (MISCELLANEOUS) ×2
CONN Y 3/8X3/8X3/8 BEN (MISCELLANEOUS) ×1 IMPLANT
CONT SPEC 4OZ CLIKSEAL STRL BL (MISCELLANEOUS) ×30 IMPLANT
COVER SURGICAL LIGHT HANDLE (MISCELLANEOUS) IMPLANT
DERMABOND ADHESIVE PROPEN (GAUZE/BANDAGES/DRESSINGS) ×2
DERMABOND ADVANCED .7 DNX6 (GAUZE/BANDAGES/DRESSINGS) ×1 IMPLANT
DRAIN CHANNEL 28F RND 3/8 FF (WOUND CARE) ×3 IMPLANT
DRAPE LAPAROSCOPIC ABDOMINAL (DRAPES) ×3 IMPLANT
DRAPE WARM FLUID 44X44 (DRAPE) ×3 IMPLANT
ELECT BLADE 6.5 EXT (BLADE) ×3 IMPLANT
ELECT REM PT RETURN 9FT ADLT (ELECTROSURGICAL) ×3
ELECTRODE REM PT RTRN 9FT ADLT (ELECTROSURGICAL) ×1 IMPLANT
GAUZE SPONGE 4X4 12PLY STRL (GAUZE/BANDAGES/DRESSINGS) ×3 IMPLANT
GLOVE SURG SIGNA 7.5 PF LTX (GLOVE) ×6 IMPLANT
GOWN STRL REUS W/ TWL LRG LVL3 (GOWN DISPOSABLE) ×2 IMPLANT
GOWN STRL REUS W/ TWL XL LVL3 (GOWN DISPOSABLE) ×1 IMPLANT
GOWN STRL REUS W/TWL LRG LVL3 (GOWN DISPOSABLE) ×4
GOWN STRL REUS W/TWL XL LVL3 (GOWN DISPOSABLE) ×2
HEMOSTAT SURGICEL 2X14 (HEMOSTASIS) ×3 IMPLANT
KIT BASIN OR (CUSTOM PROCEDURE TRAY) ×3 IMPLANT
KIT ROOM TURNOVER OR (KITS) ×3 IMPLANT
KIT SUCTION CATH 14FR (SUCTIONS) IMPLANT
NS IRRIG 1000ML POUR BTL (IV SOLUTION) ×6 IMPLANT
PACK CHEST (CUSTOM PROCEDURE TRAY) ×3 IMPLANT
PAD ARMBOARD 7.5X6 YLW CONV (MISCELLANEOUS) ×6 IMPLANT
PENCIL BUTTON HOLSTER BLD 10FT (ELECTRODE) ×3 IMPLANT
POUCH ENDO CATCH II 15MM (MISCELLANEOUS) ×3 IMPLANT
POUCH SPECIMEN RETRIEVAL 10MM (ENDOMECHANICALS) ×3 IMPLANT
RELOAD GOLD (STAPLE) ×6 IMPLANT
RELOAD GOLD ECHELON 45 (STAPLE) ×3 IMPLANT
RELOAD GREEN (STAPLE) ×9 IMPLANT
SCISSORS ENDO CVD 5DCS (MISCELLANEOUS) IMPLANT
SEALANT PROGEL (MISCELLANEOUS) IMPLANT
SEALANT SURG COSEAL 4ML (VASCULAR PRODUCTS) IMPLANT
SEALANT SURG COSEAL 8ML (VASCULAR PRODUCTS) IMPLANT
SOLUTION ANTI FOG 6CC (MISCELLANEOUS) ×3 IMPLANT
SPECIMEN JAR MEDIUM (MISCELLANEOUS) IMPLANT
SPONGE GAUZE 4X4 12PLY STER LF (GAUZE/BANDAGES/DRESSINGS) ×3 IMPLANT
SPONGE INTESTINAL PEANUT (DISPOSABLE) ×3 IMPLANT
STAPLE ECHEON FLEX 60 POW ENDO (STAPLE) ×3 IMPLANT
STAPLE RELOAD 2.5MM WHITE (STAPLE) ×6 IMPLANT
STAPLER VASCULAR ECHELON 35 (CUTTER) ×3 IMPLANT
SUT PROLENE 4 0 RB 1 (SUTURE)
SUT PROLENE 4-0 RB1 .5 CRCL 36 (SUTURE) IMPLANT
SUT SILK  1 MH (SUTURE) ×4
SUT SILK 1 MH (SUTURE) ×2 IMPLANT
SUT SILK 1 TIES 10X30 (SUTURE) ×3 IMPLANT
SUT SILK 2 0 SH (SUTURE) IMPLANT
SUT SILK 2 0SH CR/8 30 (SUTURE) IMPLANT
SUT SILK 3 0 SH 30 (SUTURE) IMPLANT
SUT SILK 3 0SH CR/8 30 (SUTURE) IMPLANT
SUT VIC AB 1 CTX 36 (SUTURE) ×2
SUT VIC AB 1 CTX36XBRD ANBCTR (SUTURE) ×1 IMPLANT
SUT VIC AB 2-0 CTX 36 (SUTURE) ×3 IMPLANT
SUT VIC AB 2-0 UR6 27 (SUTURE) IMPLANT
SUT VIC AB 3-0 MH 27 (SUTURE) IMPLANT
SUT VIC AB 3-0 X1 27 (SUTURE) ×3 IMPLANT
SUT VICRYL 2 TP 1 (SUTURE) IMPLANT
SWAB COLLECTION DEVICE MRSA (MISCELLANEOUS) IMPLANT
SYSTEM SAHARA CHEST DRAIN ATS (WOUND CARE) ×3 IMPLANT
TAPE CLOTH 4X10 WHT NS (GAUZE/BANDAGES/DRESSINGS) ×3 IMPLANT
TAPE CLOTH SURG 4X10 WHT LF (GAUZE/BANDAGES/DRESSINGS) ×3 IMPLANT
TIP APPLICATOR SPRAY EXTEND 16 (VASCULAR PRODUCTS) IMPLANT
TOWEL OR 17X24 6PK STRL BLUE (TOWEL DISPOSABLE) ×3 IMPLANT
TOWEL OR 17X26 10 PK STRL BLUE (TOWEL DISPOSABLE) ×3 IMPLANT
TRAP SPECIMEN MUCOUS 40CC (MISCELLANEOUS) IMPLANT
TRAY FOLEY CATH 16FRSI W/METER (SET/KITS/TRAYS/PACK) ×3 IMPLANT
TROCAR XCEL BLADELESS 5X75MML (TROCAR) ×3 IMPLANT
TUBE ANAEROBIC SPECIMEN COL (MISCELLANEOUS) IMPLANT
TUNNELER SHEATH ON-Q 11GX8 DSP (PAIN MANAGEMENT) IMPLANT
WATER STERILE IRR 1000ML POUR (IV SOLUTION) ×6 IMPLANT

## 2015-04-29 NOTE — Anesthesia Procedure Notes (Signed)
Procedure Name: Intubation Date/Time: 04/29/2015 1:14 PM Performed by: Izora Gala Pre-anesthesia Checklist: Patient identified, Emergency Drugs available, Suction available and Patient being monitored Patient Re-evaluated:Patient Re-evaluated prior to inductionOxygen Delivery Method: Circle system utilized Preoxygenation: Pre-oxygenation with 100% oxygen Intubation Type: IV induction Ventilation: Mask ventilation without difficulty Laryngoscope Size: Miller and 3 Grade View: Grade I Endobronchial tube: Double lumen EBT and 35 Fr Number of attempts: 1 Airway Equipment and Method: Stylet Placement Confirmation: ETT inserted through vocal cords under direct vision,  positive ETCO2 and breath sounds checked- equal and bilateral Secured at: 29 cm Tube secured with: Tape Dental Injury: Teeth and Oropharynx as per pre-operative assessment

## 2015-04-29 NOTE — H&P (View-Only) (Signed)
PCP is Henrine Screws, MD Referring Provider is Magdalen Spatz, NP  Chief Complaint  Patient presents with  . Lung Lesion    CT CHEST 04/12/15, ET 04/14/15    HPI: 69 year old woman with a long history of tobacco abuse who presents for evaluation of a lung nodule found on low-dose screening CT scan.  Mrs. Overbay is a 69 year old woman with a history of hypertension, hyperlipidemia, reflux, and tobacco abuse. She started smoking at age 64 and has smoked about 3/4 of a pack of cigarettes per day since then. She recently saw Dr. Inda Merlin who recommended a low-dose screening CT. That was done on 04/14/2015 and showed a 2.7 x 1.6 cm spiculated nodule in the right lower lobe adjacent to the spine. A PET CT was done and the lesion was hypermetabolic with an SUV of 8.7. There was an irregular area in the posterior aspect of the right upper lobe that was groundglass in nature and not a distinct nodule. There was some mild hypermetabolism in that area with an SUV of 1.5. There was no evidence of regional or distant metastatic disease.  Mrs. Niese says that she feels well. She has an occasional dry cough, but nothing out of the ordinary. She gets short of breath with heavy exertion, but can walk up a flight of stairs without difficulty. She denies any change in appetite or weight loss. She lives alone and is fully independent. She has been retired for 14 years. She's not had any unusual headaches or visual changes. She denies any chest pain, pressure, or tightness with exertion.  Zubrod Score: At the time of surgery this patient's most appropriate activity status/level should be described as: '[x]'$     0    Normal activity, no symptoms '[]'$     1    Restricted in physical strenuous activity but ambulatory, able to do out light work '[]'$     2    Ambulatory and capable of self care, unable to do work activities, up and about >50 % of waking hours                              '[]'$     3    Only limited self care, in bed  greater than 50% of waking hours '[]'$     4    Completely disabled, no self care, confined to bed or chair '[]'$     5    Moribund      Past Medical History  Diagnosis Date  . Hypertension   . Hypercholesterolemia   . Tobacco dependence   . GERD (gastroesophageal reflux disease)   . Sciatica of right side   . Seasonal allergic rhinitis   . Acute mastitis of right breast     10/2003  . Family history of breast cancer   . Colon polyps   . Tubular adenoma of colon   . Vitamin D deficiency     No past surgical history on file.  Family History  Problem Relation Age of Onset  . Cancer Father   . Cancer Sister     Social History History  Substance Use Topics  . Smoking status: Current Every Day Smoker -- 0.75 packs/day for 47 years    Types: Cigarettes  . Smokeless tobacco: Not on file     Comment: States she will quit when cigarettes cost $50.00 per carton  . Alcohol Use: Not on file    Current Outpatient Prescriptions  Medication Sig Dispense Refill  . loratadine (CLARITIN) 10 MG tablet Take 10 mg by mouth daily as needed.     . Multiple Vitamins-Minerals (MULTIVITAMIN PO) Take 1 tablet by mouth daily.    Marland Kitchen aspirin 81 MG tablet Take 81 mg by mouth daily.    . cholecalciferol (VITAMIN D) 1000 UNITS tablet Take 1,000 Units by mouth daily.    Marland Kitchen lisinopril-hydrochlorothiazide (PRINZIDE,ZESTORETIC) 20-12.5 MG per tablet Take 1 tablet by mouth daily.     . metoprolol succinate (TOPROL-XL) 50 MG 24 hr tablet Take 50 mg by mouth daily.     . simvastatin (ZOCOR) 20 MG tablet Take 20 mg by mouth daily.     No current facility-administered medications for this visit.    Allergies  Allergen Reactions  . Sulfa Antibiotics Itching    Review of Systems  Constitutional: Negative for fever, chills, activity change, appetite change and unexpected weight change.  Eyes: Negative for visual disturbance.  Respiratory: Positive for cough (nonproductive) and shortness of breath (with heavy  exertion). Negative for chest tightness and wheezing.   Cardiovascular: Negative for chest pain, palpitations and leg swelling.  Gastrointestinal: Negative.  Negative for abdominal pain and blood in stool.  Endocrine: Negative for polydipsia and polyphagia.  Genitourinary: Negative for hematuria.       "kidney disease" due to BP meds  Neurological: Negative for dizziness, seizures and syncope.  Hematological: Negative for adenopathy. Does not bruise/bleed easily.  All other systems reviewed and are negative.   BP 134/75 mmHg  Pulse 92  Resp 16  Ht 5' 3.5" (1.613 m)  Wt 124 lb (56.246 kg)  BMI 21.62 kg/m2  SpO2 98% Physical Exam  Constitutional: She is oriented to person, place, and time. She appears well-developed and well-nourished. No distress.  HENT:  Head: Normocephalic and atraumatic.  Eyes: EOM are normal. Pupils are equal, round, and reactive to light.  Neck: Neck supple. No thyromegaly present.  Left carotid bruit  Cardiovascular: Normal rate, regular rhythm, normal heart sounds and intact distal pulses.  Exam reveals no gallop and no friction rub.   No murmur heard. Pulmonary/Chest: Effort normal and breath sounds normal. She has no wheezes. She has no rales.  Abdominal: Soft. There is no tenderness.  Musculoskeletal: Normal range of motion. She exhibits no edema.  Lymphadenopathy:    She has no cervical adenopathy.  Neurological: She is alert and oriented to person, place, and time. No cranial nerve deficit.  Motor intact  Skin: Skin is warm and dry.  Psychiatric: She has a normal mood and affect.  Vitals reviewed.    Diagnostic Tests: CT CHEST WITHOUT CONTRAST  TECHNIQUE: Multidetector CT imaging of the chest was performed following the standard protocol without IV contrast.  COMPARISON: No priors.  FINDINGS: Mediastinum/Lymph Nodes: Heart size is normal. There is no significant pericardial fluid, thickening or pericardial calcification. There is  atherosclerosis of the thoracic aorta, the great vessels of the mediastinum and the coronary arteries, including calcified atherosclerotic plaque in the left anterior descending, left circumflex and the right coronary arteries. No pathologically enlarged mediastinal or hilar lymph nodes. Please note that accurate exclusion of hilar adenopathy is limited on noncontrast CT scans. Small hiatal hernia. No axillary lymphadenopathy.  Lungs/Pleura: In the medial aspect of the right lower lobe (image 266 of series 5) there is a macrolobulated and spiculated nodule that has a volume derived mean diameter of 20.8 mm, concerning for potential neoplasm. Several other smaller pulmonary nodules are seen scattered throughout the  lungs bilaterally, some of which are calcified and compatible with granulomas, while others of which are nonspecific. No acute consolidative airspace disease. No pleural effusions. Mild diffuse bronchial wall thickening with mild centrilobular and paraseptal emphysema mild bilateral apical pleural parenchymal thickening, most compatible with chronic post infectious or inflammatory scarring.  Upper Abdomen: Multiple low-attenuation lesions in the visualized kidneys, incompletely characterized on today's noncontrast CT examination, largest of which is exophytic measuring 2.8 cm in the upper pole of the left kidney; favored to represent cysts. In addition, there is a 6 mm hyperdense lesion upper pole of the right kidney, which is also incompletely characterized, but favored to represent a proteinaceous or hemorrhagic cyst. Small amount of high attenuation material layering dependently in the gallbladder may reflect tiny partially calcified gallstones or biliary sludge balls. Atherosclerosis.  Musculoskeletal/Soft Tissues: There are no aggressive appearing lytic or blastic lesions noted in the visualized portions of the skeleton.  IMPRESSION: 1. Lung-RADS Category 4BS,  suspicious. Additional imaging evaluation or consultation with pulmonary medicine or thoracic surgery recommended. 2. The "S" modifier above refers to potentially clinically significant non lung cancer related findings. Specifically, there is extensive atherosclerosis, including 3 vessel coronary artery disease. Please note that although the presence of coronary artery calcium documents the presence of coronary artery disease, the severity of this disease and any potential stenosis cannot be assessed on this non-gated CT examination. Assessment for potential risk factor modification, dietary therapy or pharmacologic therapy may be warranted, if clinically indicated. 3. Mild diffuse bronchial thickening with mild centrilobular and paraseptal emphysema; imaging findings suggestive of underlying COPD. 4. Additional incidental findings, as above These results were called by telephone at the time of interpretation on 04/12/2015 at 11:24 am to Dr. Eric Form , who verbally acknowledged these results.   Electronically Signed  By: Vinnie Langton M.D.  On: 04/12/2015 11:27  NUCLEAR MEDICINE PET SKULL BASE TO THIGH  TECHNIQUE: 5.8 mCi F-18 FDG was injected intravenously. Full-ring PET imaging was performed from the skull base to thigh after the radiotracer. CT data was obtained and used for attenuation correction and anatomic localization.  FASTING BLOOD GLUCOSE: Value: 133 mg/dl  COMPARISON: Chest CT 04/12/2015  FINDINGS: NECK  No hypermetabolic cervical lymph nodes are identified.There are no lesions of the pharyngeal mucosal space. There is prominent physiologic activity associate with the muscles of phonation. There is also some physiologic activity within the muscles in the suboccipital region.  CHEST  There are no hypermetabolic mediastinal, hilar or axillary lymph nodes. The spiculated right lower lobe nodule is significantly hypermetabolic with an SUV  max of 8.7. This nodule measures 2.7 x 1.6 cm on image number 50 and abuts the spine. No definite pleural involvement identified. There is a small focus of mildly increased metabolic activity posteriorly in the right upper lobe corresponding with an ill-defined parenchymal density on image number 19 which measures approximately 8 mm and is primarily ground-glass. This has an SUV max of 1.5. There is no other suspicious pulmonary metabolic activity. Emphysema and scattered pulmonary nodularity are again noted, similar to recent CT. There is atherosclerosis of the aorta, great vessels and coronary arteries.  ABDOMEN/PELVIS  There is no hypermetabolic activity within the liver, adrenal glands, spleen or pancreas. There is no hypermetabolic nodal activity. Incidental findings include the presence of gallstones and small bilateral renal cysts. There is a small hiatal hernia and atherosclerosis of the aorta and iliac arteries. Within the pelvis, there are multiple partially and densely calcified uterine fibroids without abnormal  metabolic activity.  SKELETON  There is no hypermetabolic activity to suggest osseous metastatic disease.  IMPRESSION: 1. The right lower lobe pulmonary nodule is significantly hypermetabolic consistent with bronchogenic carcinoma. Tissue sampling recommended. Assuming non-small cell histology, this is consistent with stage IA disease (T1bN0M0). 2. Indeterminate lesion involving the posterior right upper lobe demonstrates mildly increased metabolic activity for size and could reflect a focus of inflammation, atypical adenomatous hyperplasia or other small tumor. 3. Incidental findings as above, including atherosclerosis, renal cysts and cholelithiasis.   Electronically Signed  By: Richardean Sale M.D.  On: 04/14/2015 09:06  PULMONARY FUNCTION TESTING  FVC = 2.93 (127%) FEV1 = 2.06 (115%), postbronchodilator 2.08 FEV1/FVC 90% DLCO 39%   I  personally reviewed the low-dose CT and PET CT and concur with the radiologist's findings as noted above.  Impression: Mrs. Chandonnet is a 69 year old woman with a history of tobacco abuse who has a 2.7 x 1.6 cm spiculated nodule in the right lower lobe noted on low-dose screening CT. This is markedly hypermetabolic on PET CT. It is highly suspicious for a primary bronchogenic carcinoma. It has to be considered lung cancer unless it can be proven otherwise. PFTs do not show any significant flow disruption although there is impaired diffusion capacity at 39%.  I had a long discussion with Mrs. Wailes and her son. We reviewed the CT and PET. We discussed diagnostic and treatment algorithms. Reviewed treatment options including surgery and radiation.  My recommendation given the highly suspicious nature of the lesion and the fact that she is a good operative candidate would be to proceed with right VATS, wedge resection, possible right lower lobectomy. I described the general nature of the procedure, the need for general anesthesia, the incisions to be used, intraoperative decision making, expected hospital stay, and overall recovery to her. I reviewed the indications, risks, benefits, and alternatives. She understands the risks include, but are not limited to death, MI, DVT, PE, bleeding, possible need for transfusion, infection, prolonged air leak, cardiac arrhythmias, as well as the possibility of unforeseeable complications. We also discussed the use of cryo-analgesia to assist with postoperative pain management.  She wishes to proceed with surgery for definitive diagnosis and treatment.  Plan: Right VATS, wedge resection, possible right lower lobectomy, cryo-analgesia of intercostal nerves on Friday, 04/29/2015  [name], MD [facility] [phone]

## 2015-04-29 NOTE — Brief Op Note (Addendum)
04/29/2015      [address]       [place] [address]             [phone]   04/29/2015  3:58 PM  PATIENT:  Betty [name]  68 y.o. 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  PRE-OPERATIVE WEIGHT: 56kg  FROZEN: NSCCA- FAVORS ADENO  EBL: 129 ML  COMPLICATIONS: NO KNOWN  FINDINGS: 2 cm mass RLL with invagination of visceral pleura. Frozen- Non-small cell carcinoma.  Bronchial margin negative.

## 2015-04-29 NOTE — Anesthesia Preprocedure Evaluation (Signed)
Anesthesia Evaluation  Patient identified by MRN, date of birth, ID band Patient awake    Reviewed: Allergy & Precautions, NPO status , Patient's Chart, lab work & pertinent test results  Airway Mallampati: II  TM Distance: >3 FB Neck ROM: Full    Dental no notable dental hx.    Pulmonary pneumonia -, COPDCurrent Smoker,  breath sounds clear to auscultation  Pulmonary exam normal       Cardiovascular hypertension, negative cardio ROS Normal cardiovascular examRhythm:Regular Rate:Normal     Neuro/Psych negative neurological ROS  negative psych ROS   GI/Hepatic Neg liver ROS, GERD-  ,  Endo/Other  negative endocrine ROS  Renal/GU Renal disease     Musculoskeletal negative musculoskeletal ROS (+) Arthritis -,   Abdominal   Peds  Hematology negative hematology ROS (+)   Anesthesia Other Findings   Reproductive/Obstetrics negative OB ROS                             Anesthesia Physical Anesthesia Plan  ASA: III  Anesthesia Plan: General   Post-op Pain Management:    Induction: Intravenous  Airway Management Planned: Oral ETT  Additional Equipment: Arterial line and CVP  Intra-op Plan:   Post-operative Plan: Extubation in OR  Informed Consent: I have reviewed the patients History and Physical, chart, labs and discussed the procedure including the risks, benefits and alternatives for the proposed anesthesia with the patient or authorized representative who has indicated his/her understanding and acceptance.   Dental advisory given  Plan Discussed with: CRNA  Anesthesia Plan Comments:         Anesthesia Quick Evaluation

## 2015-04-29 NOTE — Progress Notes (Signed)
      [address]       [place] [address]             [phone]      Patient in PACU  Alert mild pain  BP 94/45 mmHg  Pulse 71  Temp(Src) 98.5 F (36.9 C) (Oral)  Resp 16  Ht 5' 3.5" (1.613 m)  Wt 124 lb 7 oz (56.444 kg)  BMI 21.69 kg/m2  SpO2 100%   Intake/Output Summary (Last 24 hours) at 04/29/15 1728 Last data filed at 04/29/15 1545  Gross per 24 hour  Intake   1200 ml  Output    400 ml  Net    800 ml   Minimal drainage from CT, she does have an air leak  CXR OK  Doing well early postop  Remo Lipps C. Roxan Hockey, MD [facility] [phone]

## 2015-04-29 NOTE — Op Note (Signed)
[name], [name]                  ACCOUNT NO.:  [id number]  MEDICAL RECORD NO.:  [id number]  LOCATION:  2S05C                        FACILITY:  [facility]  PHYSICIAN:  Revonda Standard. Roxan Hockey, M.D.DATE OF BIRTH:  12/04/1945  DATE OF PROCEDURE:  04/29/2015 DATE OF DISCHARGE:                              OPERATIVE REPORT   PREOPERATIVE DIAGNOSIS:  Right lower lobe nodule.  POSTOPERATIVE DIAGNOSIS:  Non-small cell carcinoma, right lower lobe, clinical stage Ib.  PROCEDURE:   Right video-assisted thoracoscopy Wedge resection of right lower lobe nodule Thorascopic right lower lobectomy Mediastinal lymph node dissection Cryoanalgesia of intercostal nerves 3 through 7.  SURGEON:  Revonda Standard. Roxan Hockey, M.D.  ASSISTANT:  John Giovanni, PA-C.  ANESTHESIA:  General.  FINDINGS:  Approximately 2 cm mass in the posterior aspect of the basilar portion of the right lower lobe. Invagination of the visceral pleura.  Frozen section revealed non-small cell carcinoma, likely adenocarcinoma.  Bronchial margins free of tumor.  CLINICAL NOTE:  Betty [name] is a 69 year old woman with a history of tobacco abuse who recently saw Dr. Micheal Likens.  Based on her smoking history, he recommended a low-dose screening CT scan.  It showed a 2.7 x 1.6 cm spiculated nodule in the right lower lobe.  A PET-CT showed the lesion was hypermetabolic.  She was advised to undergo a wedge resection for definitive diagnosis with a lobectomy if the lesion turned out to be cancer.  The indications, risks, benefits, alternatives, and intraoperative decision making were discussed with the patient.  She understood the plan, accepted it and agreed to proceed.  OPERATIVE NOTE:  [name] was brought to the operating room on April 29, 2015.  She had induction of general anesthesia and was intubated with a double-lumen endotracheal tube.  A Foley catheter was placed. Intravenous antibiotics were administered.  Sequential  compressive devices were placed on the calves for DVT prophylaxis.  She was placed in a left lateral decubitus position, and the right chest was prepped and draped in usual sterile fashion.  A time-out was performed to confirm correct patient, correct site, and correct procedure.  An incision was made in the seventh intercostal space in the midaxillary line.  The chest was entered with a 5 mm port.  The thoracoscope was advanced into the chest.  There was good isolation of the right lung, although it was relatively slow to deflate.  An incision was made in the fifth intercostal space in the anterior axillary line.  No rib spreading was performed during the procedure.  The incision was approximately 5 cm in length.  There was no pleural effusion and no abnormality of the parietal pleura.  Inspection of the basilar portion of the right lower lobe showed invagination of the visceral pleura overlying the tumor. The inferior pulmonary ligament was divided with electrocautery.  Level 8 and 9 lymph nodes were removed.  All nodes that were encountered during the dissection were removed and sent as separate specimens for permanent pathology.  The mass was easily palpable.  A wedge resection was performed with sequential firings of an endoscopic GIA stapler. Three firings were required.  The specimen was placed into an endoscopic retrieval  bag and removed from the chest.  It was sent for frozen section.  While awaiting the results of the frozen section, cryoanalgesia of intercostal nerves 3 through 7 was performed.  A 2 cm segment of the probe was placed over the intercostal nerve at each level, and was taken to -70 degrees Celsius for 2 minutes.  The frozen section returned showing non-small cell carcinoma, probably adenocarcinoma and the decision made to proceed with lobectomy as discussed with the patient preoperatively.  The fissure was relatively complete. The pulmonary artery was visible.   Small portions of the major fissure were incomplete anteriorly and posteriorly.  The pleura overlying the pulmonary artery was incised, there were nodes in this area.  These nodes were removed and the pulmonary arterial branches were dissected out.  The portions of the fissure there were incomplete anteriorly and posteriorly were completed, each with a single firing of an endoscopic stapler with a yellow cartridge. The pleural reflection was divided at the hilum anteriorly and posteriorly.  The inferior pulmonary vein was encircled and divided with an endoscopic vascular stapler.  Next, the dissection of the pulmonary artery branches was completed and the basilar and superior segmental branches were divided with a single firing of the endoscopic stapler.  Additional nodes were taken from around the bronchus.  By this point, the bronchus was free from all surrounding tissue.  The stapler was placed across the bronchus and closed.  A test inflation showed good aeration of the upper lobe, but the middle lobe was slow to reinflate.  The stapler was moved a fraction distally on the right lower lobe bronchus and closed again and this time there was good aeration of both the upper and middle lobes.  The stapler was fired transecting the bronchus.  The lower lobe was placed into an endoscopic retrieval bag and removed through the incision. It was sent for frozen section of the bronchial margin which subsequently returned showing no evidence of tumor.  The subcarinal space was opened and normal-appearing lymph nodes were removed from that area.  Next,  the azygos vein was retracted upwards exposing the right paratracheal nodes and the 4R lymph nodes were removed, these also appeared normal grossly.  The chest was copiously irrigated with warm saline.  A test inflation to 30 cm of water revealed no air leakage. A 28-French Blake drain was placed through the original port incision and directed  posteriorly.  A 28-French chest tube was placed through a separate stab incision just anterior to the original port incision and directed anteriorly.  The upper and middle lobes were reinflated.  The incision was closed in 3 layers.  The chest tubes were placed to suction.  The patient was extubated in the operating room and taken to the postanesthetic care unit in good condition.  All sponge, needle, and instrument counts were correct at the end of the procedure.     Revonda Standard Roxan Hockey, M.D.     SCH/MEDQ  D:  04/29/2015  T:  04/29/2015  Job:  [id number]

## 2015-04-29 NOTE — Interval H&P Note (Signed)
History and Physical Interval Note:  04/29/2015 12:17 PM  Betty [name]  has presented today for surgery, with the diagnosis of RIGHT LOWER LOBE NODULE  The various methods of treatment have been discussed with the patient and family. After consideration of risks, benefits and other options for treatment, the patient has consented to  Procedure(s) with comments: RIGHT VIDEO ASSISTED THORACOSCOPY (VATS) WEDGE RESECTION/ POSSIBLE RIGHT LOWER LOBECTOMY, CRYO-ANALGESIA OF INTERCOSTAL NERVES (Right) CRYO INTERCOSTAL NERVE BLOCK (Right) POSSIBLE LOBECTOMY  (Right) - POSSIBLE as a surgical intervention .  The patient's history has been reviewed, patient examined, no change in status, stable for surgery.  I have reviewed the patient's chart and labs.  Questions were answered to the patient's satisfaction.     [name]

## 2015-04-29 NOTE — Transfer of Care (Signed)
Immediate Anesthesia Transfer of Care Note  Patient: Betty [name]  Procedure(s) Performed: Procedure(s): RIGHT VIDEO ASSISTED THORACOSCOPY (VATS) WEDGE RESECTION/ RIGHT LOWER LOBECTOMY, CRYO-ANALGESIA OF INTERCOSTAL NERVES (Right) CRYO INTERCOSTAL NERVE BLOCK (Right) RIGHT LOWER LUNG LOBECTOMY  (Right) RIGHT LUNG LYMPH NODE DISSECTION (Right)  Patient Location: PACU  Anesthesia Type:General  Level of Consciousness: awake, sedated and confused  Airway & Oxygen Therapy: Patient Spontanous Breathing, Patient connected to nasal cannula oxygen and Patient connected to face mask oxygen  Post-op Assessment: Report given to RN, Post -op Vital signs reviewed and stable, Patient moving all extremities and Patient moving all extremities X 4  Post vital signs: Reviewed and stable  Last Vitals:  Filed Vitals:   04/29/15 1110  BP: 130/62  Pulse: 80  Temp: 36.9 C  Resp: 18    Complications: No apparent anesthesia complications

## 2015-04-30 ENCOUNTER — Inpatient Hospital Stay (HOSPITAL_COMMUNITY): Payer: Medicare Other

## 2015-04-30 LAB — BASIC METABOLIC PANEL
Anion gap: 8 (ref 5–15)
BUN: 18 mg/dL (ref 6–20)
CO2: 22 mmol/L (ref 22–32)
CREATININE: 1.86 mg/dL — AB (ref 0.44–1.00)
Calcium: 7.7 mg/dL — ABNORMAL LOW (ref 8.9–10.3)
Chloride: 110 mmol/L (ref 101–111)
GFR calc non Af Amer: 26 mL/min — ABNORMAL LOW (ref >60)
GFR, EST AFRICAN AMERICAN: 31 mL/min — AB (ref >60)
Glucose, Bld: 112 mg/dL — ABNORMAL HIGH (ref 65–99)
Potassium: 4.1 mmol/L (ref 3.5–5.1)
SODIUM: 140 mmol/L (ref 135–145)

## 2015-04-30 LAB — BLOOD GAS, ARTERIAL
ACID-BASE DEFICIT: 5.9 mmol/L — AB (ref 0.0–2.0)
Bicarbonate: 18.5 mEq/L — ABNORMAL LOW (ref 20.0–24.0)
Drawn by: 36496
FIO2: 0.28 %
O2 SAT: 98.1 %
PATIENT TEMPERATURE: 98.6
TCO2: 19.5 mmol/L (ref 0–100)
pCO2 arterial: 32.8 mmHg — ABNORMAL LOW (ref 35.0–45.0)
pH, Arterial: 7.369 (ref 7.350–7.450)
pO2, Arterial: 113 mmHg — ABNORMAL HIGH (ref 80.0–100.0)

## 2015-04-30 LAB — CBC
HEMATOCRIT: 25.6 % — AB (ref 36.0–46.0)
HEMOGLOBIN: 8.8 g/dL — AB (ref 12.0–15.0)
MCH: 31.5 pg (ref 26.0–34.0)
MCHC: 34.4 g/dL (ref 30.0–36.0)
MCV: 91.8 fL (ref 78.0–100.0)
Platelets: 236 10*3/uL (ref 150–400)
RBC: 2.79 MIL/uL — ABNORMAL LOW (ref 3.87–5.11)
RDW: 12.8 % (ref 11.5–15.5)
WBC: 13.5 10*3/uL — AB (ref 4.0–10.5)

## 2015-04-30 MED ORDER — ALBUTEROL SULFATE (2.5 MG/3ML) 0.083% IN NEBU: 2.5000 mg | INHALATION_SOLUTION | Freq: Four times a day (QID) | RESPIRATORY_TRACT | Status: DC | PRN

## 2015-04-30 MED ORDER — LORATADINE 10 MG PO TABS
10.0000 mg | ORAL_TABLET | Freq: Every day | ORAL | Status: DC | PRN
  Filled 2015-04-30: qty 1

## 2015-04-30 MED ORDER — ENOXAPARIN SODIUM 40 MG/0.4ML ~~LOC~~ SOLN
40.0000 mg | SUBCUTANEOUS | Status: DC
  Administered 2015-04-30: 40 mg via SUBCUTANEOUS
  Filled 2015-04-30: qty 0.4

## 2015-04-30 MED ORDER — ENOXAPARIN SODIUM 30 MG/0.3ML ~~LOC~~ SOLN
30.0000 mg | SUBCUTANEOUS | Status: DC
  Administered 2015-05-01 – 2015-05-02 (×2): 30 mg via SUBCUTANEOUS
  Filled 2015-04-30 (×3): qty 0.3

## 2015-04-30 MED ORDER — ASPIRIN 81 MG PO CHEW
81.0000 mg | CHEWABLE_TABLET | Freq: Every day | ORAL | Status: DC
  Administered 2015-04-30 – 2015-05-03 (×4): 81 mg via ORAL
  Filled 2015-04-30 (×3): qty 1

## 2015-04-30 NOTE — Progress Notes (Signed)
1 Day Post-Op Procedure(s) (LRB): RIGHT VIDEO ASSISTED THORACOSCOPY (VATS) WEDGE RESECTION/ RIGHT LOWER LOBECTOMY, CRYO-ANALGESIA OF INTERCOSTAL NERVES (Right) CRYO INTERCOSTAL NERVE BLOCK (Right) RIGHT LOWER LUNG LOBECTOMY  (Right) RIGHT LUNG LYMPH NODE DISSECTION (Right) Subjective: No complaints this AM "I feel better than I thought I would" Denies nausea  Objective: Vital signs in last 24 hours: Temp:  [96.5 F (35.8 C)-98.5 F (36.9 C)] 98.1 F (36.7 C) (06/25 0745) Pulse Rate:  [39-91] 63 (06/25 0800) Cardiac Rhythm:  [-] Normal sinus rhythm (06/25 0800) Resp:  [10-35] 17 (06/25 0800) BP: (84-130)/(40-62) 98/46 mmHg (06/25 0800) SpO2:  [87 %-100 %] 98 % (06/25 0819) Arterial Line BP: (86-117)/(35-53) 93/35 mmHg (06/25 0800) Weight:  [124 lb 7 oz (56.444 kg)-127 lb 13.9 oz (58 kg)] 127 lb 13.9 oz (58 kg) (06/25 0500)  Hemodynamic parameters for last 24 hours:    Intake/Output from previous day: 06/24 0701 - 06/25 0700 In: 2887.5 [I.V.:2787.5; IV Piggyback:100] Out: 6812 [Urine:1260; Chest Tube:350] Intake/Output this shift: Total I/O In: 125 [I.V.:125] Out: 200 [Urine:150; Chest Tube:50]  General appearance: alert and no distress Neurologic: intact Heart: regular rate and rhythm Lungs: clear to auscultation bilaterally Abdomen: normal findings: soft, non-tender + air leak  Lab Results:  Recent Labs  04/27/15 0850 04/30/15 0350  WBC 10.1 13.5*  HGB 11.3* 8.8*  HCT 32.9* 25.6*  PLT 348 236   BMET:  Recent Labs  04/27/15 0850 04/30/15 0350  NA 141 140  K 4.0 4.1  CL 109 110  CO2 20* 22  GLUCOSE 108* 112*  BUN 23* 18  CREATININE 2.16* 1.86*  CALCIUM 9.5 7.7*    PT/INR:  Recent Labs  04/27/15 0850  LABPROT 12.9  INR 0.95   ABG    Component Value Date/Time   PHART 7.369 04/30/2015 0345   HCO3 18.5* 04/30/2015 0345   TCO2 19.5 04/30/2015 0345   ACIDBASEDEF 5.9* 04/30/2015 0345   O2SAT 98.1 04/30/2015 0345   CBG (last 3)  No results  for input(s): GLUCAP in the last 72 hours.  Assessment/Plan: S/P Procedure(s) (LRB): RIGHT VIDEO ASSISTED THORACOSCOPY (VATS) WEDGE RESECTION/ RIGHT LOWER LOBECTOMY, CRYO-ANALGESIA OF INTERCOSTAL NERVES (Right) CRYO INTERCOSTAL NERVE BLOCK (Right) RIGHT LOWER LUNG LOBECTOMY  (Right) RIGHT LUNG LYMPH NODE DISSECTION (Right) -  POD # 1 Doing well  CV- stable  RESP_ good air movement, no wheezing  CT to water seal  RENAL- stage III CKD, creatinine below baseline  Decrease IVF, keep Foley today  GI- start clears, advance diet as tolerated  SCD + enoxaparin for DVT prophylaxis  Transfer to 3S when bed available   LOS: 1 day    [name] 04/30/2015

## 2015-04-30 NOTE — Anesthesia Postprocedure Evaluation (Signed)
Anesthesia Post Note  Patient: Betty [name]  Procedure(s) Performed: Procedure(s) (LRB): RIGHT VIDEO ASSISTED THORACOSCOPY (VATS) WEDGE RESECTION/ RIGHT LOWER LOBECTOMY, CRYO-ANALGESIA OF INTERCOSTAL NERVES (Right) CRYO INTERCOSTAL NERVE BLOCK (Right) RIGHT LOWER LUNG LOBECTOMY  (Right) RIGHT LUNG LYMPH NODE DISSECTION (Right)  Anesthesia type: General  Patient location: PACU  Post pain: Pain level controlled  Post assessment: Post-op Vital signs reviewed  Last Vitals: BP 101/85 mmHg  Pulse 99  Temp(Src) 37.2 C (Oral)  Resp 18  Ht 5' 3.5" (1.613 m)  Wt 127 lb 13.9 oz (58 kg)  BMI 22.29 kg/m2  SpO2 92%  Post vital signs: Reviewed  Level of consciousness: sedated  Complications: No apparent anesthesia complications

## 2015-04-30 NOTE — Progress Notes (Signed)
Utilization review completed.  

## 2015-04-30 NOTE — Progress Notes (Signed)
Report called to [name] RN. Pt to transfer to 3S-11 via ambulation. Meds in chart, belongings at bedside, family notified of room change. VS stable at time of transfer. Bedside handoff to [name], Therapist, sports. No current questions or complaints.  Betty [name]

## 2015-04-30 NOTE — Progress Notes (Signed)
Patient ambulated with Rn and wheel chair from [place] at [phone]. Patient alert and oriented x4 with x2 Right pleural chest tubes to waterseal. Patient draining serous drainage from site that 2s rn endorsed is not a new issue. Patient stated that pain is managed with PCA and tylenol and rated discomfort 2/10. See e charting for additional data

## 2015-05-01 ENCOUNTER — Inpatient Hospital Stay (HOSPITAL_COMMUNITY): Payer: Medicare Other

## 2015-05-01 LAB — COMPREHENSIVE METABOLIC PANEL
ALT: 14 U/L (ref 14–54)
AST: 31 U/L (ref 15–41)
Albumin: 2.6 g/dL — ABNORMAL LOW (ref 3.5–5.0)
Alkaline Phosphatase: 56 U/L (ref 38–126)
Anion gap: 7 (ref 5–15)
BUN: 16 mg/dL (ref 6–20)
CALCIUM: 7.9 mg/dL — AB (ref 8.9–10.3)
CO2: 21 mmol/L — AB (ref 22–32)
Chloride: 109 mmol/L (ref 101–111)
Creatinine, Ser: 1.94 mg/dL — ABNORMAL HIGH (ref 0.44–1.00)
GFR calc Af Amer: 29 mL/min — ABNORMAL LOW (ref >60)
GFR calc non Af Amer: 25 mL/min — ABNORMAL LOW (ref >60)
Glucose, Bld: 90 mg/dL (ref 65–99)
Potassium: 4.1 mmol/L (ref 3.5–5.1)
Sodium: 137 mmol/L (ref 135–145)
Total Bilirubin: 0.4 mg/dL (ref 0.3–1.2)
Total Protein: 5.6 g/dL — ABNORMAL LOW (ref 6.5–8.1)

## 2015-05-01 LAB — CBC
HCT: 28.2 % — ABNORMAL LOW (ref 36.0–46.0)
Hemoglobin: 9.6 g/dL — ABNORMAL LOW (ref 12.0–15.0)
MCH: 31.3 pg (ref 26.0–34.0)
MCHC: 34 g/dL (ref 30.0–36.0)
MCV: 91.9 fL (ref 78.0–100.0)
PLATELETS: 282 10*3/uL (ref 150–400)
RBC: 3.07 MIL/uL — ABNORMAL LOW (ref 3.87–5.11)
RDW: 12.8 % (ref 11.5–15.5)
WBC: 11.3 10*3/uL — ABNORMAL HIGH (ref 4.0–10.5)

## 2015-05-01 NOTE — Progress Notes (Signed)
Fentanyl PCA syringe changed and wasted 5.6 cc in sink. Witnessed by Glenford Peers, RN

## 2015-05-01 NOTE — Progress Notes (Addendum)
      [address]       [place] [address]             [phone]      2 Days Post-Op Procedure(s) (LRB): RIGHT VIDEO ASSISTED THORACOSCOPY (VATS) WEDGE RESECTION/ RIGHT LOWER LOBECTOMY, CRYO-ANALGESIA OF INTERCOSTAL NERVES (Right) CRYO INTERCOSTAL NERVE BLOCK (Right) RIGHT LOWER LUNG LOBECTOMY  (Right) RIGHT LUNG LYMPH NODE DISSECTION (Right)   Subjective:  Betty [name] states she is doing okay.  She does have some incisional pain around her chest tubes.  She is hoping one can be removed today.  Objective: Vital signs in last 24 hours: Temp:  [97.5 F (36.4 C)-98.9 F (37.2 C)] 97.8 F (36.6 C) (06/26 0737) Pulse Rate:  [68-99] 86 (06/26 0421) Cardiac Rhythm:  [-] Normal sinus rhythm (06/25 1630) Resp:  [13-26] 13 (06/26 0421) BP: (94-120)/(46-85) 98/61 mmHg (06/26 0421) SpO2:  [92 %-100 %] 98 % (06/26 0421) Arterial Line BP: (93-99)/(40-41) 93/40 mmHg (06/25 1000)  Intake/Output from previous day: 06/25 0701 - 06/26 0700 In: 2956 [P.O.:360; I.V.:1307] Out: 2570 [Urine:2350; Chest Tube:220] Intake/Output this shift: Total I/O In: -  Out: 550 [Urine:550]  General appearance: alert, cooperative and no distress Heart: regular rate and rhythm Lungs: diminished breath sounds bibasilar Abdomen: soft, non-tender; bowel sounds normal; no masses,  no organomegaly Wound: clean and dry, minimal sub q emphysema present around chest tubes  Lab Results:  Recent Labs  04/30/15 0350 05/01/15 0558  WBC 13.5* 11.3*  HGB 8.8* 9.6*  HCT 25.6* 28.2*  PLT 236 282   BMET:  Recent Labs  04/30/15 0350 05/01/15 0558  NA 140 137  K 4.1 4.1  CL 110 109  CO2 22 21*  GLUCOSE 112* 90  BUN 18 16  CREATININE 1.86* 1.94*  CALCIUM 7.7* 7.9*    PT/INR: No results for input(s): LABPROT, INR in the last 72 hours. ABG    Component Value Date/Time   PHART 7.369 04/30/2015 0345   HCO3 18.5* 04/30/2015 0345   TCO2 19.5 04/30/2015 0345   ACIDBASEDEF 5.9* 04/30/2015  0345   O2SAT 98.1 04/30/2015 0345   CBG (last 3)  No results for input(s): GLUCAP in the last 72 hours.  Assessment/Plan: S/P Procedure(s) (LRB): RIGHT VIDEO ASSISTED THORACOSCOPY (VATS) WEDGE RESECTION/ RIGHT LOWER LOBECTOMY, CRYO-ANALGESIA OF INTERCOSTAL NERVES (Right) CRYO INTERCOSTAL NERVE BLOCK (Right) RIGHT LOWER LUNG LOBECTOMY  (Right) RIGHT LUNG LYMPH NODE DISSECTION (Right)  1. Chest tubes- 220 cc output yesterday, no definitive air leak seen, CXR with questionable apical pneumothorax and worsening of sub-Q air- can likely remove one chest tube today 2. Pulm- wean oxygen as tolerated, good use of IS 3. CV- hemodynamically stable 4. Renal- creatinine elevated at 1.96 will repeat BMET in AM 5. Decrease IV Fluids to [facility], patient tolerating a diet without difficulty 6. D/C Foley catheter 7. Dispo- patient stable, CXR free from air leak, possibly remove one chest tube today  LOS: 2 days    Betty [name], Betty [name] 05/01/2015  Patient seen and examined. She has an air leak now, tube may have been kinked earlier Drainage down- will dc posterior CT Her baseline creatinine preop was 2.16.  Revonda Standard Roxan Hockey, MD [facility] [phone]

## 2015-05-01 NOTE — Progress Notes (Signed)
Discontinued Right posterior chest tube without complication according to policy/ protocol at ~6282. Patient was educated on precautions after removal, used incentive spirometer and remained in semi- fowlers position. Patient denied additional discomfort after procedure and denied respiratory issues. Right anterior chest tube remains patent and intact

## 2015-05-02 ENCOUNTER — Inpatient Hospital Stay (HOSPITAL_COMMUNITY): Payer: Medicare Other

## 2015-05-02 ENCOUNTER — Encounter (HOSPITAL_COMMUNITY): Payer: Self-pay | Admitting: Thoracic Surgery (Cardiothoracic Vascular Surgery)

## 2015-05-02 LAB — BASIC METABOLIC PANEL
Anion gap: 4 — ABNORMAL LOW (ref 5–15)
BUN: 16 mg/dL (ref 6–20)
CHLORIDE: 112 mmol/L — AB (ref 101–111)
CO2: 22 mmol/L (ref 22–32)
CREATININE: 1.76 mg/dL — AB (ref 0.44–1.00)
Calcium: 8.7 mg/dL — ABNORMAL LOW (ref 8.9–10.3)
GFR calc non Af Amer: 28 mL/min — ABNORMAL LOW (ref >60)
GFR, EST AFRICAN AMERICAN: 33 mL/min — AB (ref >60)
Glucose, Bld: 91 mg/dL (ref 65–99)
Potassium: 4 mmol/L (ref 3.5–5.1)
Sodium: 138 mmol/L (ref 135–145)

## 2015-05-02 NOTE — Progress Notes (Signed)
3 Days Post-Op Procedure(s) (LRB): RIGHT VIDEO ASSISTED THORACOSCOPY (VATS) WEDGE RESECTION/ RIGHT LOWER LOBECTOMY, CRYO-ANALGESIA OF INTERCOSTAL NERVES (Right) CRYO INTERCOSTAL NERVE BLOCK (Right) RIGHT LOWER LUNG LOBECTOMY  (Right) RIGHT LUNG LYMPH NODE DISSECTION (Right) Subjective: Feels well Pain well controlled  Objective: Vital signs in last 24 hours: Temp:  [97.9 F (36.6 C)-98.6 F (37 C)] 98.6 F (37 C) (06/27 0800) Pulse Rate:  [71-85] 74 (06/27 0435) Cardiac Rhythm:  [-] Normal sinus rhythm (06/27 0800) Resp:  [13-24] 16 (06/27 0800) BP: (108-126)/(52-67) 118/55 mmHg (06/27 0435) SpO2:  [92 %-100 %] 96 % (06/27 0800)  Hemodynamic parameters for last 24 hours:    Intake/Output from previous day: 06/26 0701 - 06/27 0700 In: 633.7 [P.O.:240; I.V.:393.7] Out: 2310 [Urine:1950; Chest Tube:360] Intake/Output this shift:    General appearance: alert and no distress Neurologic: intact Heart: regular rate and rhythm Lungs: clear to auscultation bilaterally Wound: clean and dry  Lab Results:  Recent Labs  04/30/15 0350 05/01/15 0558  WBC 13.5* 11.3*  HGB 8.8* 9.6*  HCT 25.6* 28.2*  PLT 236 282   BMET:  Recent Labs  05/01/15 0558 05/02/15 0600  NA 137 138  K 4.1 4.0  CL 109 112*  CO2 21* 22  GLUCOSE 90 91  BUN 16 16  CREATININE 1.94* 1.76*  CALCIUM 7.9* 8.7*    PT/INR: No results for input(s): LABPROT, INR in the last 72 hours. ABG    Component Value Date/Time   PHART 7.369 04/30/2015 0345   HCO3 18.5* 04/30/2015 0345   TCO2 19.5 04/30/2015 0345   ACIDBASEDEF 5.9* 04/30/2015 0345   O2SAT 98.1 04/30/2015 0345   CBG (last 3)  No results for input(s): GLUCAP in the last 72 hours.  Assessment/Plan: S/P Procedure(s) (LRB): RIGHT VIDEO ASSISTED THORACOSCOPY (VATS) WEDGE RESECTION/ RIGHT LOWER LOBECTOMY, CRYO-ANALGESIA OF INTERCOSTAL NERVES (Right) CRYO INTERCOSTAL NERVE BLOCK (Right) RIGHT LOWER LUNG LOBECTOMY  (Right) RIGHT LUNG LYMPH NODE  DISSECTION (Right) -  Doing well No air leak- dc chest tube Dc PCA Path still pending Likely home tomorrow if she continues to progress   LOS: 3 days    Betty [name] 05/02/2015

## 2015-05-02 NOTE — Progress Notes (Addendum)
Wasted 55ms of Fentanyl, per MD order. Witnessed by LMeribeth Mattes RN  Co-signed by LMeribeth MattesRN

## 2015-05-02 NOTE — Care Management (Signed)
Important Message  Patient Details  Name: Betty [name] MRN: [id number] Date of Birth: 1946/10/07   Medicare Important Message Given:  Yes-second notification given    Nathen May 05/02/2015, 4:20 PM

## 2015-05-02 NOTE — Discharge Summary (Signed)
Physician Discharge Summary  Patient ID: Betty [name] MRN: [id number] DOB/AGE: 02/04/1946 69 y.o.  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 S/P lobectomy of lung 04/29/2015  . Nodule of right lung 04/22/2015     HPI: At time of consultation   69 year old woman with a long history of tobacco abuse who presents for evaluation of a lung nodule found on low-dose screening CT scan.  Betty [name] is a 69 year old woman with a history of hypertension, hyperlipidemia, reflux, and tobacco abuse. She started smoking at age 59 and has smoked about 3/4 of a pack of cigarettes per day since then. She recently saw Dr. Inda Merlin who recommended a low-dose screening CT. That was done on 04/14/2015 and showed a 2.7 x 1.6 cm spiculated nodule in the right lower lobe adjacent to the spine. A PET CT was done and the lesion was hypermetabolic with an SUV of 8.7. There was an irregular area in the posterior aspect of the right upper lobe that was groundglass in nature and not a distinct nodule. There was some mild hypermetabolism in that area with an SUV of 1.5. There was no evidence of regional or distant metastatic disease.  Betty [name] says that she feels well. She has an occasional dry cough, but nothing out of the ordinary. She gets short of breath with heavy exertion, but can walk up a flight of stairs without difficulty. She denies any change in appetite or weight loss. She lives alone and is fully independent. She has been retired for 14 years. She's not had any unusual headaches or visual changes. She denies any chest pain, pressure, or tightness with exertion.  She was admitted this hospitalization for elective resection.  Final pathology: Currently pending  Discharged Condition:  good  Hospital Course: The patient was admitted electively and taken to the operating room on 04/29/2015 where she underwent right video-assisted thoracoscopy, wedge resection of the right lower lobe nodule, thoracoscopic right lower lobectomy, mediastinal lymph node dissection, cryo-analgesia of intercostal nerves III through VII. Frozen section was consistent with non-small cell carcinoma and findings were clinically Ib. Final pathology remains pending. The patient has remained hemodynamically stable. Chest tubes were discontinued in the usual manner following serial chest x-ray and drainage devices observation. Pain was controlled quite well initially with PCA and transition to oral narcotics. Creatinine has actually trended below preoperative level of 2.16 and is currently 1.76. She has a mild acute blood loss anemia which is stable. Incisions are noted to be healing well without evidence of infection. She is tolerating gradually increasing activities using standard protocols. She is maintaining good room air oxygen saturations. She is maintaining normal sinus rhythm. Currently her status is felt to be tentatively stable for discharge in the next 24-48 hours pending ongoing reevaluation of her recovery.  Consults: None  Significant Diagnostic Studies: Routine postoperative serial chest x-ray  Treatments: surgery:  DATE OF PROCEDURE: 04/29/2015 DATE OF DISCHARGE:   OPERATIVE REPORT   PREOPERATIVE DIAGNOSIS: Right lower lobe nodule.  POSTOPERATIVE DIAGNOSIS: Non-small cell carcinoma, right lower lobe, clinical stage Ib.  PROCEDURE: Right video-assisted thoracoscopy, wedge resection of right lower lobe nodule, thorascopic right lower lobectomy, mediastinal lymph node dissection, cryoanalgesia of intercostal nerves 3 through 7.  SURGEON: Revonda Standard. Roxan Hockey, M.D.  ASSISTANT: John Giovanni,  PA-C.  ANESTHESIA: General.  FINDINGS:  Approximately 2 cm mass in the posterior aspect of the basilar portion of the right lower lobe, invagination of the visceral pleura. Frozen section revealed non-small cell carcinoma, likely adenocarcinoma. Bronchial margins free of tumor.   Discharge Exam: Blood pressure 118/55, pulse 74, temperature 98.6 F (37 C), temperature source Oral, resp. rate 16, height 5' 3.5" (1.613 m), weight 127 lb 13.9 oz (58 kg), SpO2 96 %.   General appearance: alert, cooperative and no distress Heart: regular rate and rhythm Lungs: min dim in right right base Abdomen: benign Extremities: extremities normal, atraumatic, no cyanosis or edema and no edema or calf tenderness Wound: incis healing well Disposition: discharged home   medications at time of discharge:   Medication List    TAKE these medications        aspirin 81 MG tablet  Take 81 mg by mouth daily.     cholecalciferol 1000 UNITS tablet  Commonly known as:  VITAMIN D  Take 1,000 Units by mouth daily.     lisinopril-hydrochlorothiazide 20-12.5 MG per tablet  Commonly known as:  PRINZIDE,ZESTORETIC  Take 1 tablet by mouth daily.     loratadine 10 MG tablet  Commonly known as:  CLARITIN  Take 10 mg by mouth daily as needed.     metoprolol succinate 50 MG 24 hr tablet  Commonly known as:  TOPROL-XL  Take 50 mg by mouth daily.     MULTIVITAMIN PO  Take 1 tablet by mouth daily.     oxyCODONE 5 MG immediate release tablet  Commonly known as:  Oxy IR/ROXICODONE  Take 1-2 tablets (5-10 mg total) by mouth every 4 (four) hours as needed for severe pain.     simvastatin 20 MG tablet  Commonly known as:  ZOCOR  Take 20 mg by mouth 3 (three) times a week. Mon Wed Fri              Follow-up Information    Follow up with [name], MD.   Specialty:  Cardiothoracic Surgery   Why:  05/17/2015 at 1245 to see the surgeon. Please obtain a chest x-ray at [facility] at 12:15. [facility] is located in the  same office complex as the Psychologist, sport and exercise.   Contact information:   [place] [place]  [place] [address] [phone]       Follow up with [facility].   Specialty:  Cardiothoracic Surgery   Why:  05/10/2015 at 9:30 AM to see the nurse for suture removal.   Contact information:   [address] [place], [place] [phone]      Signed: John Giovanni 05/02/2015, 10:42 AM

## 2015-05-02 NOTE — Discharge Instructions (Signed)
Lung Cancer °Lung cancer is an abnormal growth of cells in one or both of your lungs. These extra cells may form a mass of tissue called a growth or tumor. Tumors can be either cancerous (malignant) or not cancerous (benign).  °Lung cancer is the most common cause of cancer death in men and women. There are several different types of lung cancers. Usually, lung cancer is described as either small cell lung cancer or nonsmall cell lung cancer. Other types of cancer occur in the lungs, including carcinoid and cancers spread from other organs. The types of cancer have different behavior and treatment. °RISK FACTORS °Smoking is the most common risk factor for developing lung cancer. Other risk factors include: °· Radon gas exposure. °· Asbestos and other industrial substance exposure. °· Second hand tobacco smoke. °· Air pollution. °· Family or personal history of lung cancer. °· Age older than 65 years. °CAUSES  °Lung cancer usually starts when the lungs are exposed to harmful chemicals. Smoking is the most common risk factor for lung cancer. When you quit smoking, your risk of lung cancer falls each year (but is never the same as a person who has never smoked).  °SYMPTOMS  °Lung cancer may not have any symptoms in its early stages. The symptoms can depend on the type of cancer, its location, and other factors. Symptoms can include: °· Cough (either new, different, or more severe). °· Shortness of breath. °· Coughing up blood (hemoptysis). °· Chest pain. °· Hoarseness. °· Swelling of the face. °· Drooping eyelid. °· Changes in blood tests, such as low sodium (hyponatremia), high calcium (hypercalcemia), or low blood count (anemia). °· Weight loss. °DIAGNOSIS  °Your health care provider may suspect lung cancer based on your symptoms or based on tests obtained for other reasons. Tests or procedures used to find or confirm the presence of lung cancer may include: °· Chest X-ray. °· CT scan of the lungs and chest. °· Blood  tests. °· Taking a tissue sample (biopsy) from your lung to look for cancer cells. °Your cancer will be staged to determine its severity and extent. Staging is a careful attempt to find out the size of the tumor, whether the cancer has spread, and if so, to what parts of the body. You may need to have more tests to determine the stage of your cancer. The test results will help determine what treatment plan is best for you.  °· Stage 0--This is the earliest stage of lung cancer. In this stage the tumor is present in only a few layers of cells and has not grown beyond the inner lining of the lungs. Stage 0 (carcinoma in situ) is considered noninvasive, meaning at this stage it is not yet capable of spreading to other regions. °· Stage I-- The cancer is located only in the lungs and not spread to any lymph nodes. °· Stage II--The cancer is in the lungs and the nearby lymph nodes. °· Stage III--The cancer is in the lungs and the lymph nodes in the middle of the chest. This is also called locally advanced disease. This stage has two subtypes: °¨ Stage IIIa - The cancer has spread only to lymph nodes on the same side of the chest where the cancer started. °¨ Stage IIIb - The cancer has spread to lymph nodes on the opposite side of the chest or above the collar bone. °· Stage IV-- This is the most advanced stage of lung cancer and is also called advanced disease. This   stage describes when the cancer has spread to both lungs, the fluid in the area around the lungs, or to another body part. °Your health care provider may tell you the detailed stage of your cancer, which includes both a number and a letter.  °TREATMENT  °Depending on the type and stage, lung cancer may be treated with surgery, radiation therapy, chemotherapy, or targeted therapy. Some people have a combination of these therapies. Your treatment plan will be developed by your health care team.  °HOME CARE INSTRUCTIONS  °· Do not smoke. °· Only take  over-the-counter or prescription medicines for pain, discomfort, or fever as directed by your health care provider. °· Maintain a healthy diet. °· Consider joining a support group. This may help you learn to cope with the stress of having lung cancer. °· Seek advice to help you manage treatment side effects. °· Keep all follow-up appointments as directed by your health care provider. °· Inform your cancer specialist if you are admitted to the hospital. °SEEK MEDICAL CARE IF:  °· You are losing weight without trying. °· You have a persistent cough. °· You feel short of breath. °· You tire easily. °SEEK IMMEDIATE MEDICAL CARE IF:  °· You cough up clotted blood or bright red blood. °· Your pain is not manageable or controlled by medicine. °· You develop new difficulty breathing or chest pain. °· You develop swelling in one or both ankles or legs, or swelling in your face or neck. °· You develop headache or confusion. °Document Released: 01/28/2001 Document Revised: 08/12/2013 Document Reviewed: 02/25/2014 °ExitCare® Patient Information ©2015 [employer]. This information is not intended to replace advice given to you by your health care provider. Make sure you discuss any questions you have with your health care provider. °Thoracoscopy °Care After °Refer to this sheet in the next few weeks. These discharge instructions provide you with general information on caring for yourself after you leave the hospital. Your caregiver may also give you specific instructions. Your treatment has been planned according to the most current medical practices available, but unavoidable complications sometimes occur. If you have any problems or questions after discharge, call your caregiver. °HOME CARE INSTRUCTIONS  °· Remove the bandage (dressing) over your chest tube site as directed by your caregiver. °· It is normal to be sore for a couple weeks following surgery. See your caregiver if this seems to be getting worse rather than  better. °· Only take over-the-counter or prescription medicines for pain, discomfort, or fever as directed by your caregiver. It is very important to take pain medicine when you need it so that you will cough and breathe deeply enough to clear mucus (phlegm) and expand your lungs. °· If it hurts to cough, hold a pillow against your chest when you cough. This may help with the discomfort. In spite of the discomfort, cough frequently, as this helps protect against getting an infection in your lung (pneumonia). °· Taking deep breaths keeps lungs inflated and protects against pneumonia. Most patients will go home with an incentive spirometer that encourages deep breathing. °· You may resume a normal diet and activities as directed. °· Use showers for bathing until you see your caregiver, or as instructed. °· Change dressings if necessary or as directed. °· Avoid lifting or driving until you are instructed otherwise. °· Make an appointment to see your caregiver for stitch (suture) or staple removal when instructed. °· Do not travel by airplane for 2 weeks after the chest tube is removed. °  SEEK MEDICAL CARE IF:  °· You are bleeding from your wounds. °· You have redness, swelling, or increasing pain in the wounds. °· Your heartbeat feels irregular or very fast. °· There is pus coming from your wounds. °· There is a bad smell coming from the wound or dressing. °SEEK IMMEDIATE MEDICAL CARE IF:  °· You have a fever. °· You develop a rash. °· You have difficulty breathing. °· You develop any reaction or side effects to medicines given. °· You develop lightheadedness or feel faint. °· You develop shortness of breath or chest pain. °MAKE SURE YOU:  °· Understand these instructions. °· Will watch your condition. °· Will get help right away if you are not doing well or get worse. °Document Released: 05/11/2005 Document Revised: 01/14/2012 Document Reviewed: 04/11/2011 °ExitCare® Patient Information ©2015 [employer]. This  information is not intended to replace advice given to you by your health care provider. Make sure you discuss any questions you have with your health care provider. ° °

## 2015-05-03 ENCOUNTER — Inpatient Hospital Stay (HOSPITAL_COMMUNITY): Payer: Medicare Other

## 2015-05-03 MED ORDER — OXYCODONE HCL 5 MG PO TABS: 5.0000 mg | ORAL_TABLET | ORAL | Status: DC | PRN

## 2015-05-03 NOTE — Progress Notes (Signed)
Pt given discharge packet. Medication regimen and care reviewed with patient. Patient states, "no further questions at this time". Awaiting ride home for discharge. Will continue to monitor.

## 2015-05-03 NOTE — Progress Notes (Signed)
[facility] DAILY ICU PROGRESS NOTE                   [address]            [place] [address]          [phone]   4 Days Post-Op Procedure(s) (LRB): RIGHT VIDEO ASSISTED THORACOSCOPY (VATS) WEDGE RESECTION/ RIGHT LOWER LOBECTOMY, CRYO-ANALGESIA OF INTERCOSTAL NERVES (Right) CRYO INTERCOSTAL NERVE BLOCK (Right) RIGHT LOWER LUNG LOBECTOMY  (Right) RIGHT LUNG LYMPH NODE DISSECTION (Right)  Total Length of Stay:  LOS: 4 days   Subjective: Feels pretty well, anxious to go home, path is still pending  Objective: Vital signs in last 24 hours: Temp:  [97.7 F (36.5 C)-98.8 F (37.1 C)] 98 F (36.7 C) (06/28 0326) Pulse Rate:  [68-90] 90 (06/28 0326) Cardiac Rhythm:  [-] Normal sinus rhythm (06/27 2000) Resp:  [14-20] 20 (06/28 0326) BP: (102-123)/(58-94) 102/58 mmHg (06/28 0326) SpO2:  [96 %-100 %] 96 % (06/28 0326)  Filed Weights   04/29/15 1110 04/30/15 0500  Weight: 124 lb 7 oz (56.444 kg) 127 lb 13.9 oz (58 kg)    Weight change:    Hemodynamic parameters for last 24 hours:    Intake/Output from previous day:    Intake/Output this shift:    Current Meds: Scheduled Meds: . acetaminophen  1,000 mg Oral 4 times per day   Or  . acetaminophen (TYLENOL) oral liquid 160 mg/5 mL  1,000 mg Oral 4 times per day  . aspirin  81 mg Oral Daily  . bisacodyl  10 mg Oral Daily  . enoxaparin (LOVENOX) injection  30 mg Subcutaneous Q24H  . metoprolol succinate  50 mg Oral Daily  . pantoprazole  40 mg Oral Daily  . senna-docusate  1 tablet Oral QHS  . simvastatin  20 mg Oral Once per day on Mon Wed Fri   Continuous Infusions: . dextrose 5 % and 0.9% NaCl 10 mL/hr at 05/01/15 1900   PRN Meds:.albuterol, diphenhydrAMINE **OR** diphenhydrAMINE, loratadine, naloxone **AND** sodium chloride, ondansetron (ZOFRAN) IV, oxyCODONE, potassium chloride, traMADol  General appearance: alert, cooperative and no distress Heart: regular rate and rhythm Lungs: min dim in right  right base Abdomen: benign Extremities: extremities normal, atraumatic, no cyanosis or edema and no edema or calf tenderness Wound: incis healing well  Lab Results: CBC: Recent Labs  05/01/15 0558  WBC 11.3*  HGB 9.6*  HCT 28.2*  PLT 282   BMET:  Recent Labs  05/01/15 0558 05/02/15 0600  NA 137 138  K 4.1 4.0  CL 109 112*  CO2 21* 22  GLUCOSE 90 91  BUN 16 16  CREATININE 1.94* 1.76*  CALCIUM 7.9* 8.7*    PT/INR: No results for input(s): LABPROT, INR in the last 72 hours. Radiology: Dg Chest 2 View  05/03/2015   CLINICAL DATA:  Lobectomy.  EXAM: CHEST  2 VIEW  COMPARISON:  05/02/2015  FINDINGS: The right jugular central line extends into the right atrium. There is no pneumothorax. There is mild streaky opacity in the right base. The left lung is clear. Pulmonary vasculature is normal. Subcutaneous emphysema persists about the right hemi thorax.  IMPRESSION: Mild linear basilar opacities.  No pneumothorax.   Electronically Signed   By: Andreas Newport M.D.   On: 05/03/2015 07:13   Dg Chest Port 1 View  05/02/2015   CLINICAL DATA:  Status post lobectomy, with removal of chest tube, check central line position  EXAM: PORTABLE CHEST -  1 VIEW  COMPARISON:  05/02/2015  FINDINGS: Volume loss in the right hemithorax with interval removal of chest tube. Possible tiny right apical pneumothorax.  Left lung is clear.  The heart is normal in size.  Right IJ venous catheter terminates in the lower right atrium, 5.5 cm below the cavoatrial junction.  Moderate subcutaneous emphysema along the right lateral chest wall.  IMPRESSION: Interval removal of right chest tube. Possible tiny right apical pneumothorax. Moderate subcutaneous emphysema.  Right IJ venous catheter terminates in the low right atrium, 5.5 cm below the cavoatrial junction.   Electronically Signed   By: Julian Hy M.D.   On: 05/02/2015 13:02     Assessment/Plan: S/P Procedure(s) (LRB): RIGHT VIDEO ASSISTED THORACOSCOPY  (VATS) WEDGE RESECTION/ RIGHT LOWER LOBECTOMY, CRYO-ANALGESIA OF INTERCOSTAL NERVES (Right) CRYO INTERCOSTAL NERVE BLOCK (Right) RIGHT LOWER LUNG LOBECTOMY  (Right) RIGHT LUNG LYMPH NODE DISSECTION (Right) Plan for discharge: see discharge orders     Zabdi Mis E 05/03/2015 7:40 AM

## 2015-05-03 NOTE — Care Management Note (Addendum)
Case Management Note  Patient Details  Name: Betty [name] MRN: [id number] Date of Birth: 1946-09-08  Subjective/Objective:                 Pt from home s/p VAT's / RL Lobectomy   Action/Plan: Return to home when medically stable. CM to f/u with d/c needs.  Expected Discharge Date:                  Expected Discharge Plan:  Home/Self Care  In-House Referral:     Discharge planning Services  CM Consult  Post Acute Care Choice:    Choice offered to:     DME Arranged:    DME Agency:     HH Arranged:    [place] Agency:     Status of Service:  Completed, signed off  Medicare Important Message Given:  Yes-second notification given Date Medicare IM Given:    Medicare IM give by:    Date Additional Medicare IM Given:    Additional Medicare Important Message give by:     If discussed at [place] of Stay Meetings, dates discussed:    Additional Comments:  Sharin Mons, RN 05/03/2015, 11:36 AM

## 2015-05-05 DIAGNOSIS — F17201 Nicotine dependence, unspecified, in remission: Secondary | ICD-10-CM | POA: Diagnosis not present

## 2015-05-05 DIAGNOSIS — C3430 Malignant neoplasm of lower lobe, unspecified bronchus or lung: Secondary | ICD-10-CM | POA: Diagnosis not present

## 2015-05-10 ENCOUNTER — Encounter: Payer: Self-pay | Admitting: *Deleted

## 2015-05-10 NOTE — Progress Notes (Signed)
Oncology Nurse Navigator Documentation  Oncology Nurse Navigator Flowsheets 05/10/2015  Navigator Encounter Type Other/Per pathology request, I completed [facility] form and faxed to Dr. Roxan Hockey to fill out.    Treatment Phase Other/post surgery  Time Spent with Patient 30

## 2015-05-16 ENCOUNTER — Other Ambulatory Visit: Payer: Self-pay | Admitting: Thoracic Surgery (Cardiothoracic Vascular Surgery)

## 2015-05-16 DIAGNOSIS — Z902 Acquired absence of lung [part of]: Secondary | ICD-10-CM

## 2015-05-17 ENCOUNTER — Encounter: Payer: Self-pay | Admitting: Thoracic Surgery (Cardiothoracic Vascular Surgery)

## 2015-05-17 ENCOUNTER — Ambulatory Visit
Admission: RE | Admit: 2015-05-17 | Discharge: 2015-05-17 | Disposition: A | Discharge status: Home / Self Care | Payer: Medicare Other | Source: Ambulatory Visit | Attending: Thoracic Surgery (Cardiothoracic Vascular Surgery) | Admitting: Thoracic Surgery (Cardiothoracic Vascular Surgery)

## 2015-05-17 ENCOUNTER — Ambulatory Visit (INDEPENDENT_AMBULATORY_CARE_PROVIDER_SITE_OTHER): Payer: Self-pay | Admitting: Thoracic Surgery (Cardiothoracic Vascular Surgery)

## 2015-05-17 ENCOUNTER — Telehealth: Payer: Self-pay | Admitting: *Deleted

## 2015-05-17 VITALS — BP 125/80 | HR 73 | Resp 20 | Ht 63.5 in | Wt 127.0 lb

## 2015-05-17 DIAGNOSIS — C3491 Malignant neoplasm of unspecified part of right bronchus or lung: Secondary | ICD-10-CM | POA: Insufficient documentation

## 2015-05-17 DIAGNOSIS — Z902 Acquired absence of lung [part of]: Secondary | ICD-10-CM

## 2015-05-17 DIAGNOSIS — Z9889 Other specified postprocedural states: Secondary | ICD-10-CM

## 2015-05-17 DIAGNOSIS — I700 Atherosclerosis of aorta: Secondary | ICD-10-CM | POA: Diagnosis not present

## 2015-05-17 HISTORY — DX: Malignant neoplasm of unspecified part of right bronchus or lung: C34.91

## 2015-05-17 MED ORDER — TRAMADOL HCL 50 MG PO TABS: 50.0000 mg | ORAL_TABLET | Freq: Three times a day (TID) | ORAL | Status: DC | PRN

## 2015-05-17 NOTE — Progress Notes (Signed)
    [address]       [place] [address]             [phone]       HPI:  Mrs. Tufano returns today for a scheduled postoperative follow-up visit  She is a 69-year-old woman who had a thoracoscopic right upper lobectomy on 04/29/2015. She had a nodule in her right upper lobe that was discovered on a low-dose screening CT scan. It turned out to be a stage IB (T2, N0) non-small cell carcinoma. She did well postoperatively and was discharged home on day 4.  She has some numbness and some pain in her right chest. She just taking Tylenol for the pain. She never filled her pain medication prescription. She says she is very sore when she wakes up in the morning and wants something stronger to take at night. She is not having any problems with shortness of breath. Overall she feels well.  Past Medical History  Diagnosis Date  . Hypertension   . Hypercholesterolemia   . Tobacco dependence   . GERD (gastroesophageal reflux disease)   . Sciatica of right side   . Seasonal allergic rhinitis   . Acute mastitis of right breast     10/2003  . Family history of breast cancer   . Colon polyps   . Tubular adenoma of colon   . Vitamin D deficiency   . COPD (chronic obstructive pulmonary disease)     emphysema  . Arthritis     bursitis in right shoulder  . Chronic kidney disease     CKD stage 3 04/28/15      Current Outpatient Prescriptions  Medication Sig Dispense Refill  . acetaminophen (TYLENOL) 325 MG tablet Take 650 mg by mouth every 6 (six) hours as needed.    . aspirin 81 MG tablet Take 81 mg by mouth daily.    . cholecalciferol (VITAMIN D) 1000 UNITS tablet Take 1,000 Units by mouth daily.    . lisinopril-hydrochlorothiazide (PRINZIDE,ZESTORETIC) 20-12.5 MG per tablet Take 1 tablet by mouth daily.     . loratadine (CLARITIN) 10 MG tablet Take 10 mg by mouth daily as needed.     . metoprolol succinate (TOPROL-XL) 50 MG 24 hr tablet Take 50 mg by mouth daily.       . Multiple Vitamins-Minerals (MULTIVITAMIN PO) Take 1 tablet by mouth daily.    . simvastatin (ZOCOR) 20 MG tablet Take 20 mg by mouth 3 (three) times a week. Mon Wed Fri    . traMADol (ULTRAM) 50 MG tablet Take 1-2 tablets (50-100 mg total) by mouth 3 (three) times daily as needed. 30 tablet 0   No current facility-administered medications for this visit.    Physical Exam BP 125/80 mmHg  Pulse 73  Resp 20  Ht 5' 3.5" (1.613 m)  Wt 127 lb (57.607 kg)  BMI 22.14 kg/m2  SpO2 98% 69-year-old woman in no acute distress Alert and oriented 3 with no neurologic deficits Cardiac regular rate and rhythm normal S1 and S2 Lungs slightly diminished right base otherwise clear Incisions healing well  Diagnostic Tests:  Her chest x-ray was reviewed. It shows postoperative changes.  Impression: 69-year-old woman with stage IB non-small cell carcinoma status post thoracoscopic right upper lobectomy on 04/29/2015. She is doing extremely well. She is having some discomfort and wants something stronger to take for pain at night. I gave her prescription for tramadol 50 mg tablets one to 2 by mouth 3   times daily as needed for pain, 30 tablets, no refills. I suspect she'll be able to get by with just 1 or 2 of those at night.  At this point she may begin driving, appropriate precautions were discussed. Her activities are unrestricted, but she was cautioned to build into new activities gradually.  I am going to refer her to our multidisciplinary thoracic oncology clinic to see Dr. Mohamed our oncologist for evaluation. I did inform her that her tumor was sent to [facility] for testing to assess the recurrence.  Plan: I will plan to see her back in 2 months with a chest x-ray to check on her progress.   C , MD [facility] [phone]     

## 2015-05-17 NOTE — Telephone Encounter (Signed)
Oncology Nurse Navigator Documentation  Oncology Nurse Navigator Flowsheets 05/17/2015  Referral date to RadOnc/MedOnc 05/17/2015  Navigator Encounter Type Introductory phone call/I received a referral from Dr. Leonarda Salon office today.  I called and left a vm message regarding appt to see Dr. Julien Nordmann on July 28th arrive at the cancer center at 12:30.  I also left my name and phone number to call if needed.   Treatment Phase Treatment  Coordination of Care MD Appointments  Time Spent with Patient (Retired) 15

## 2015-05-24 ENCOUNTER — Encounter (HOSPITAL_COMMUNITY): Payer: Self-pay

## 2015-05-27 ENCOUNTER — Other Ambulatory Visit: Payer: Medicare Other

## 2015-05-27 ENCOUNTER — Telehealth: Payer: Self-pay | Admitting: *Deleted

## 2015-05-27 NOTE — Telephone Encounter (Signed)
Oncology Nurse Navigator Documentation  Oncology Nurse Navigator Flowsheets 05/27/2015  Navigator Encounter Type Telephone/Called patient to clarify appt on 06/02/15.  I left vm message for patient to arrive at the cancer center at [facility] at 3:30.  I also left my name and phone number to call if needed.   Interventions Coordination of Care  Coordination of Care MD Appointments  Time Spent with Patient 15

## 2015-06-02 ENCOUNTER — Telehealth: Payer: Self-pay | Admitting: Medical Oncology

## 2015-06-02 ENCOUNTER — Encounter: Payer: Self-pay | Admitting: Internal Medicine

## 2015-06-02 ENCOUNTER — Ambulatory Visit: Payer: Medicare Other | Admitting: Internal Medicine

## 2015-06-02 NOTE — Telephone Encounter (Signed)
PT FTKA - Pt states she did not know about appt.She is r/s for next Thursday aug 4th at 1415 . I asked her to be here at 2pm to check in. She confirmed  appt.and directions.  Dr Roxan Hockey spoke to her on phone regarding her molecular result and Dr Julien Nordmann will talk to her more about this next Thursday.

## 2015-06-06 ENCOUNTER — Telehealth: Payer: Self-pay | Admitting: *Deleted

## 2015-06-06 NOTE — Telephone Encounter (Signed)
Oncology Nurse Navigator Documentation  Oncology Nurse Navigator Flowsheets 06/06/2015  Navigator Encounter Type Telephone/Called patient to clarify appt on 06/09/15.  I left a vm message on time and place of appt.  06/09/15 arrive at 1:30.  I also left my name and phone number to call if needed for any questions   Treatment Phase -  Interventions Other  Coordination of Care MD Appointments  Time Spent with Patient 15

## 2015-06-08 ENCOUNTER — Ambulatory Visit
Admission: RE | Admit: 2015-06-08 | Discharge: 2015-06-08 | Disposition: A | Discharge status: Home / Self Care | Payer: Medicare Other | Source: Ambulatory Visit | Attending: Internal Medicine | Admitting: Internal Medicine

## 2015-06-08 ENCOUNTER — Telehealth: Payer: Self-pay | Admitting: *Deleted

## 2015-06-08 DIAGNOSIS — Z86010 Personal history of colonic polyps: Secondary | ICD-10-CM

## 2015-06-08 DIAGNOSIS — K562 Volvulus: Secondary | ICD-10-CM | POA: Diagnosis not present

## 2015-06-08 DIAGNOSIS — K5730 Diverticulosis of large intestine without perforation or abscess without bleeding: Secondary | ICD-10-CM | POA: Diagnosis not present

## 2015-06-08 NOTE — Telephone Encounter (Signed)
Called pt w/ friendly reminder for her clinic appt tomorrow 8/4 w/ arrival time of 1:30 and she states that she has a hair appt and will come as soon as she get finished.

## 2015-06-09 ENCOUNTER — Ambulatory Visit: Payer: Medicare Other

## 2015-06-09 ENCOUNTER — Encounter: Payer: Self-pay | Admitting: Internal Medicine

## 2015-06-09 ENCOUNTER — Telehealth: Payer: Self-pay | Admitting: Physician Assistant

## 2015-06-09 ENCOUNTER — Ambulatory Visit: Payer: Medicare Other | Attending: Internal Medicine | Admitting: Physical Therapy

## 2015-06-09 ENCOUNTER — Ambulatory Visit (HOSPITAL_BASED_OUTPATIENT_CLINIC_OR_DEPARTMENT_OTHER): Payer: Medicare Other | Admitting: Internal Medicine

## 2015-06-09 ENCOUNTER — Telehealth: Payer: Self-pay | Admitting: *Deleted

## 2015-06-09 ENCOUNTER — Encounter: Payer: Self-pay | Admitting: *Deleted

## 2015-06-09 VITALS — BP 139/88 | HR 87 | Temp 98.4°F | Resp 18 | Ht 64.0 in | Wt 122.0 lb

## 2015-06-09 DIAGNOSIS — Z87891 Personal history of nicotine dependence: Secondary | ICD-10-CM | POA: Diagnosis not present

## 2015-06-09 DIAGNOSIS — C3431 Malignant neoplasm of lower lobe, right bronchus or lung: Secondary | ICD-10-CM | POA: Diagnosis not present

## 2015-06-09 DIAGNOSIS — Z803 Family history of malignant neoplasm of breast: Secondary | ICD-10-CM

## 2015-06-09 DIAGNOSIS — R0789 Other chest pain: Secondary | ICD-10-CM

## 2015-06-09 DIAGNOSIS — E119 Type 2 diabetes mellitus without complications: Secondary | ICD-10-CM | POA: Diagnosis not present

## 2015-06-09 DIAGNOSIS — R0602 Shortness of breath: Secondary | ICD-10-CM | POA: Insufficient documentation

## 2015-06-09 DIAGNOSIS — C3491 Malignant neoplasm of unspecified part of right bronchus or lung: Secondary | ICD-10-CM

## 2015-06-09 DIAGNOSIS — E559 Vitamin D deficiency, unspecified: Secondary | ICD-10-CM

## 2015-06-09 DIAGNOSIS — C782 Secondary malignant neoplasm of pleura: Secondary | ICD-10-CM

## 2015-06-09 DIAGNOSIS — Z801 Family history of malignant neoplasm of trachea, bronchus and lung: Secondary | ICD-10-CM | POA: Diagnosis not present

## 2015-06-09 MED ORDER — FOLIC ACID 1 MG PO TABS: 1.0000 mg | ORAL_TABLET | Freq: Every day | ORAL | Status: DC

## 2015-06-09 MED ORDER — PROCHLORPERAZINE MALEATE 10 MG PO TABS: 10.0000 mg | ORAL_TABLET | Freq: Four times a day (QID) | ORAL | Status: DC | PRN

## 2015-06-09 MED ORDER — DEXAMETHASONE 4 MG PO TABS: ORAL_TABLET | ORAL | Status: DC

## 2015-06-09 MED ORDER — CYANOCOBALAMIN 1000 MCG/ML IJ SOLN
1000.0000 ug | Freq: Once | INTRAMUSCULAR | Status: AC
  Administered 2015-06-09: 1000 ug via INTRAMUSCULAR

## 2015-06-09 NOTE — Therapy (Signed)
[place], [place], [address] Phone: [phone]   Fax:  [phone]  Physical Therapy Evaluation  Patient Details  Name: Betty [name] MRN: [id number] Date of Birth: 1946/02/01 Referring Provider:  Curt Bears, MD  Encounter Date: 06/09/2015      PT End of Session - 06/09/15 1515    Visit Number 1   Number of Visits 1   PT Start Time 4967   PT Stop Time 1445   PT Time Calculation (min) 20 min   Activity Tolerance Patient tolerated treatment well   Behavior During Therapy [facility] for tasks assessed/performed      Past Medical History  Diagnosis Date  . Hypertension   . Hypercholesterolemia   . Tobacco dependence   . GERD (gastroesophageal reflux disease)   . Sciatica of right side   . Seasonal allergic rhinitis   . Acute mastitis of right breast     10/2003  . Family history of breast cancer   . Colon polyps   . Tubular adenoma of colon   . Vitamin D deficiency   . COPD (chronic obstructive pulmonary disease)     emphysema  . Arthritis     bursitis in right shoulder  . Chronic kidney disease     CKD stage 3 04/28/15    Past Surgical History  Procedure Laterality Date  . Vaginal delivery      51 yrs ago  . Breast surgery      small mass removed from left breast--benign  . Video assisted thoracoscopy (vats)/ lobectomy Right 04/29/2015    Procedure: RIGHT VIDEO ASSISTED THORACOSCOPY (VATS) WEDGE RESECTION/ RIGHT LOWER LOBECTOMY, CRYO-ANALGESIA OF INTERCOSTAL NERVES;  Surgeon: [name], MD;  Location: [place];  Service: Thoracic;  Laterality: Right;  . Cryo intercostal nerve block Right 04/29/2015    Procedure: CRYO INTERCOSTAL NERVE BLOCK;  Surgeon: [name], MD;  Location: [place];  Service: Thoracic;  Laterality: Right;  . Lobectomy Right 04/29/2015    Procedure: RIGHT LOWER LUNG LOBECTOMY ;  Surgeon: [name], MD;  Location: [place];  Service: Thoracic;   Laterality: Right;  . Lymph node dissection Right 04/29/2015    Procedure: RIGHT LUNG LYMPH NODE DISSECTION;  Surgeon: [name], MD;  Location: [place];  Service: Thoracic;  Laterality: Right;    There were no vitals filed for this visit.  Visit Diagnosis:  Chest pain, musculoskeletal - Plan: PT plan of care cert/re-cert  Shortness of breath on exertion - Plan: PT plan of care cert/re-cert      Subjective Assessment - 06/09/15 1503    Subjective c/o numbness and discomfort inferior to right breast; also some shortness of breath recently   Patient is accompained by: Family member  son   Pertinent History Patient had low dose CT screening which indicated and abnormality; PET 04/14/15 showed right lower lobe nodule with SUV of 8.7.  Diagnosed with right lower lobe adenocarcinoma, stage IA, with visceral pleura involved but no positive nodes.  Had VATS lobectomy 04/29/15 and will have chemotherapy recommended due to high risk for recurrence.  Ex-smoker quit 04/2015.  HTN, high cholesterol, GERD, right sciatics, COPD/empysema, right shoulder bursitis, chronic kidney disease.  Patient Stated Goals none   Currently in Pain? Yes   Pain Score --  up to 8/10   Pain Location Chest   Pain Orientation Right  inferior to breast   Pain Descriptors / Indicators Numbness   Pain Type Surgical pain   Pain Onset More than a month ago   Pain Frequency Intermittent   Aggravating Factors  wearing a bra   Pain Relieving Factors not wearing a bra            [facility] PT Assessment - 06/09/15 0001    Assessment   Medical Diagnosis right lower lobe adenocarcinoma, stage IA   Onset Date/Surgical Date 04/29/15   Precautions   Precautions Other (comment)  cancer precautions   Restrictions   Weight Bearing Restrictions No   Balance Screen   Has the patient fallen in the past 6 months No   Has the patient had a decrease in activity level because  of a fear of falling?  No   Is the patient reluctant to leave their home because of a fear of falling?  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   Leisure no regular exercise   Cognition   Overall Cognitive Status Within Functional Limits for tasks assessed   Observation/Other Assessments   Observations thin, energetic woman who looks well and looks younger than her age   Functional Tests   Functional tests Sit to Stand   Sit to Stand   Comments 12 times in 30 seconds   Posture/Postural Control   Posture/Postural Control No significant limitations   ROM / Strength   AROM / PROM / Strength AROM   AROM   Overall AROM Comments Trunk AROM WFL for all motions   Balance   Balance Assessed Yes   Dynamic Standing Balance   Dynamic Standing - Comments reaches forward 13 inches in standing                           PT Education - 06/09/15 1514    Education provided Yes   Education Details posture, breathing, walking, C[employer] article on staying active, physical therapy info   Person(s) Educated Patient;Child(ren)   Methods Explanation;Handout   Comprehension Verbalized understanding               Lung Clinic Goals - 06/09/15 1520    Patient will be able to verbalize understanding of the benefit of exercise to decrease fatigue.   Status Achieved   Patient will be able to verbalize the importance of posture.   Status Achieved   Patient will be able to demonstrate diaphragmatic breathing for improved lung function.   Status Achieved   Patient will be able to verbalize understanding of the role of physical therapy to prevent functional decline and who to contact if physical therapy is needed.   Status Achieved             Plan - 06/09/15 1515    Clinical Impression Statement 69 year-old  woman looking younger than her age who has undergone right lower lobectomy for adenocarcinoma (04/29/15), now with some right chest pain from incision; expected to undergo chemotherapy due to high risk of recurrence of her cancer.  She does not currently do exercise and would benefit from this  Pt will benefit from skilled therapeutic intervention in order to improve on the following deficits Cardiopulmonary status limiting activity   Rehab Potential Good   PT Frequency One time visit   PT Treatment/Interventions Patient/family education   PT Next Visit Plan None at this time; patient may benefit from therapy going forward if she is unable to initiate an exercise program on her own or if she has difficulty with fatigue or balance following chemotherapy.   PT Home Exercise Plan see education section   Consulted and Agree with Plan of Care Patient          G-Codes - 06-18-15 1520    Functional Assessment Tool Used clinical judgement   Functional Limitation --   Mobility: Walking and Moving Around Current Status [phone]) At least 1 percent but less than 20 percent impaired, limited or restricted   Mobility: Walking and Moving Around Goal Status [phone]) At least 1 percent but less than 20 percent impaired, limited or restricted   Mobility: Walking and Moving Around Discharge Status [phone]) At least 1 percent but less than 20 percent impaired, limited or restricted       Problem List Patient Active Problem List   Diagnosis Date Noted  . Non-small cell carcinoma of right lung, stage 1 05/17/2015  . S/P lobectomy of lung 04/29/2015    Tensley Wery 06/18/15, 3:28 PM  [place], [place], [address] Phone: [phone]   Fax:  [place], PT 06-18-2015 3:28 PM

## 2015-06-09 NOTE — Progress Notes (Signed)
I emailed FA to check with patient to evaluate needs.

## 2015-06-09 NOTE — Telephone Encounter (Signed)
Per staff message and POF I have scheduled appts. Advised scheduler of appts. JMW  

## 2015-06-09 NOTE — Telephone Encounter (Signed)
per pof to sch pt appt-gave pt avs-sent MW email to sch pt trmt-pt to get updated sch 8/9 chemo edu class

## 2015-06-09 NOTE — Progress Notes (Signed)
Oncology Nurse Navigator Documentation  Oncology Nurse Navigator Flowsheets 06/09/2015  Navigator Encounter Type Clinic/MDC  Patient Visit Type Initial  Treatment Phase Treatment  Interventions Education Method  Education Method Verbal;Written  Time Spent with Patient 15         Thoracic Treatment Summary Name:Betty [name] Date:06/09/2015 DOB:[birth year] Your Medical Team Medical Oncologist:Dr. Julien Nordmann Surgeon:Dr. Hendrickson Type and Stage of Lung Cancer Non-Small Cell Carcinoma: Adenocarcinoma   Clinical Stage:  Non-small cell carcinoma of right lung, stage 1   Staging form: Lung, AJCC 7th Edition     Clinical: Stage IB (T2a, N0, M0) - Unsigned    Clinical stage is based on radiology exams.  Pathological stage will be determined after surgery.  Staging is based on the size of the tumor, involvement of lymph nodes or not, and whether or not the cancer center has spread. Recommendations Recommendations: chemotherapy  These recommendations are based on information available as of today's consult.  This is subject to change depending further testing or exams. Next Steps Next Step: Medical Oncology will set up follow up appointments Barriers to Care What do you perceive as a potential barrier that may prevent you from receiving your treatment plan? Education information on lung cancer and resources here at [facility] given and explained  Financial FA will follow up with patient to check on needs.    Resources Given: NCI Booklet on [employer] at [employer].Radonna Ricker [phone]     Questions Norton Blizzard, RN BSN Thoracic Oncology Nurse Navigator at [place] is a nurse navigator that is available to assist you through your cancer journey.  She can answer your questions and/or provide resources regarding your treatment plan, emotional support, or financial concerns.

## 2015-06-09 NOTE — Progress Notes (Signed)
[place] Clinical Social Work  Clinical Social Work met with patient/family at [employer] appointment to offer support and assess for psychosocial needs.  Ms. Custer was accompanied by her son.  She indicated her support system includes her son, two brothers, and two sister in laws.  She has no concerns after meeting with medical oncologist and shared it was comforting to know "I don't have to do chemo, but am doing it to prevent cancer from coming back".  Clinical Social Work briefly discussed Clinical Social Work role and [employer] support programs/services.  Clinical Social Work encouraged patient to call with any additional questions or concerns.   Polo Riley, MSW, LCSW, OSW-C Clinical Social Worker [facility] [phone]

## 2015-06-09 NOTE — Progress Notes (Signed)
[facility] [phone]   [phone] Multidisciplinary thoracic oncology clinic  CONSULT NOTE  REFERRING PHYSICIAN: Dr. Charlett Lango  REASON FOR CONSULTATION:  69 years old African-American female recently diagnosed with lung cancer.  HPI Betty [name] is a 69 y.o. female with past medical history significant for hypertension, dyslipidemia, GERD, colon polyps as well as vitamin D deficiency and long history of smoking. The patient was seen by her primary care physician Dr. Kevan Ny in early June 2016 and because of her smoking history. He recommended for her to have low-dose CT scan screening for lung cancer. This CT scan was performed on 04/12/2015 and it showed a macrolobulated and a spiculated nodule measuring 20.8 mm concerning for potential neoplasm in the medial aspect of the right lower lobe. This was followed by a PET scan performed in 04/14/2015 and it showed the right lower lobe my nodule is significantly hypermetabolic consistent with bronchogenic carcinoma. There was also indeterminate lesion involving the posterior right upper lobe with mild increased metabolic activity 4 size and could reflect a focus of inflammation, atypical adenomatous hyperplasia or other small tumor. The patient was referred to Dr. Dorris Fetch and on 04/29/2015 she underwent right VATS, right lower lobectomy with lymph node dissection. The final pathology (Accession: [id number]) showed invasive moderately differentiated adenocarcinoma spanning 3.2 cm with involvement of the visceral pleura as well as focal lymphovascular invasion. The tumor specimen was sent to [facility] for prognostic evaluation and the final results showed high risk with 35% risk of death 5 years. Dr. Dorris Fetch kindly referred the patient to me today for evaluation and discussion of adjuvant treatment options. When seen today the patient is feeling fine except for the numbness on the right side of  the chest. She lost around 4 pounds during her hospitalization. She denied having any significant shortness of breath, cough or hemoptysis. The patient denied having any significant visual changes or headache. She has no nausea or vomiting, no fever or chills. Family history significant for a mother who died during childbirth and father had lung cancer. She also had 2 sisters with breast cancer. The patient is single and has one son; Betty [name]. She is currently retired and used to work for the [employer]. The patient had a history of smoking less than one pack per day for around 46 years and quit on 04/28/2015. She drinks alcohol occasionally and no history of drug abuse.  HPI  Past Medical History  Diagnosis Date  . Hypertension   . Hypercholesterolemia   . Tobacco dependence   . GERD (gastroesophageal reflux disease)   . Sciatica of right side   . Seasonal allergic rhinitis   . Acute mastitis of right breast     10/2003  . Family history of breast cancer   . Colon polyps   . Tubular adenoma of colon   . Vitamin D deficiency   . COPD (chronic obstructive pulmonary disease)     emphysema  . Arthritis     bursitis in right shoulder  . Chronic kidney disease     CKD stage 3 04/28/15    Past Surgical History  Procedure Laterality Date  . Vaginal delivery      51 yrs ago  . Breast surgery      small mass removed from left breast--benign  . Video assisted thoracoscopy (vats)/ lobectomy Right 04/29/2015    Procedure: RIGHT VIDEO ASSISTED THORACOSCOPY (VATS) WEDGE RESECTION/ RIGHT LOWER LOBECTOMY, CRYO-ANALGESIA OF INTERCOSTAL NERVES;  Surgeon:  [name], MD;  Location: [facility];  Service: Thoracic;  Laterality: Right;  . Cryo intercostal nerve block Right 04/29/2015    Procedure: CRYO INTERCOSTAL NERVE BLOCK;  Surgeon: [name], MD;  Location: [facility];  Service: Thoracic;  Laterality: Right;  . Lobectomy Right 04/29/2015    Procedure: RIGHT LOWER LUNG LOBECTOMY ;   Surgeon: [name], MD;  Location: [facility];  Service: Thoracic;  Laterality: Right;  . Lymph node dissection Right 04/29/2015    Procedure: RIGHT LUNG LYMPH NODE DISSECTION;  Surgeon: [name], MD;  Location: [facility];  Service: Thoracic;  Laterality: Right;    Family History  Problem Relation Age of Onset  . Cancer Father   . Cancer Sister     Social History History  Substance Use Topics  . Smoking status: Former Smoker -- 0.75 packs/day for 47 years    Types: Cigarettes    Quit date: 04/29/2015  . Smokeless tobacco: Not on file     Comment: no cigarettes since surgery  . Alcohol Use: 0.0 oz/week    0 Standard drinks or equivalent per week     Comment: rare for special occasions    Allergies  Allergen Reactions  . Sulfa Antibiotics Itching    Current Outpatient Prescriptions  Medication Sig Dispense Refill  . acetaminophen (TYLENOL) 325 MG tablet Take 650 mg by mouth every 6 (six) hours as needed.    Marland Kitchen aspirin 81 MG tablet Take 81 mg by mouth daily.    . cholecalciferol (VITAMIN D) 1000 UNITS tablet Take 1,000 Units by mouth daily.    Marland Kitchen dexamethasone (DECADRON) 4 MG tablet 4 mg po bid the day before, day of and day after chemo 40 tablet 1  . folic acid (FOLVITE) 1 MG tablet Take 1 tablet (1 mg total) by mouth daily. 30 tablet 4  . lisinopril-hydrochlorothiazide (PRINZIDE,ZESTORETIC) 20-12.5 MG per tablet Take 1 tablet by mouth daily.     Marland Kitchen loratadine (CLARITIN) 10 MG tablet Take 10 mg by mouth daily as needed.     . metoprolol succinate (TOPROL-XL) 50 MG 24 hr tablet Take 50 mg by mouth daily.     . Multiple Vitamins-Minerals (MULTIVITAMIN PO) Take 1 tablet by mouth daily.    . prochlorperazine (COMPAZINE) 10 MG tablet Take 1 tablet (10 mg total) by mouth every 6 (six) hours as needed for nausea or vomiting. 30 tablet 0  . simvastatin (ZOCOR) 20 MG tablet Take 20 mg by mouth 3 (three) times a week. Mon Wed Fri    . traMADol (ULTRAM) 50 MG tablet Take 1-2  tablets (50-100 mg total) by mouth 3 (three) times daily as needed. 30 tablet 0   No current facility-administered medications for this visit.    Review of Systems  Constitutional: negative Eyes: negative Ears, nose, mouth, throat, and face: negative Respiratory: positive for pleurisy/chest pain Cardiovascular: negative Gastrointestinal: negative Genitourinary:negative Integument/breast: negative Hematologic/lymphatic: negative Musculoskeletal:negative Neurological: negative Behavioral/Psych: negative Endocrine: negative Allergic/Immunologic: negative  Physical Exam  LZJ:QBHAL, healthy, no distress, well nourished, well developed and anxious SKIN: skin color, texture, turgor are normal, no rashes or significant lesions HEAD: Normocephalic, No masses, lesions, tenderness or abnormalities EYES: normal, PERRLA, Conjunctiva are pink and non-injected EARS: External ears normal, Canals clear OROPHARYNX:no exudate, no erythema and lips, buccal mucosa, and tongue normal  NECK: supple, no adenopathy, no JVD LYMPH:  no palpable lymphadenopathy, no hepatosplenomegaly BREAST:not examined LUNGS: clear to auscultation , and palpation HEART: regular rate & rhythm,  no murmurs and no gallops ABDOMEN:abdomen soft, non-tender, normal bowel sounds and no masses or organomegaly BACK: Back symmetric, no curvature., No CVA tenderness EXTREMITIES:no joint deformities, effusion, or inflammation, no edema, no skin discoloration, no clubbing  NEURO: alert & oriented x 3 with fluent speech, no focal motor/sensory deficits  PERFORMANCE STATUS: ECOG 0  LABORATORY DATA: Lab Results  Component Value Date   WBC 11.3* 05/01/2015   HGB 9.6* 05/01/2015   HCT 28.2* 05/01/2015   MCV 91.9 05/01/2015   PLT 282 05/01/2015      Chemistry      Component Value Date/Time   NA 138 05/02/2015 0600   K 4.0 05/02/2015 0600   CL 112* 05/02/2015 0600   CO2 22 05/02/2015 0600   BUN 16 05/02/2015 0600    CREATININE 1.76* 05/02/2015 0600      Component Value Date/Time   CALCIUM 8.7* 05/02/2015 0600   ALKPHOS 56 05/01/2015 0558   AST 31 05/01/2015 0558   ALT 14 05/01/2015 0558   BILITOT 0.4 05/01/2015 0558       RADIOGRAPHIC STUDIES: Dg Chest 2 View  05/17/2015   CLINICAL DATA:  Status post partial lobectomy of right lung.  EXAM: CHEST  2 VIEW  COMPARISON:  05/03/2015  FINDINGS: Heart size is normal. Aortic atherosclerosis noted. Volume loss and asymmetric elevation of right hemidiaphragm compatible right lower lobectomy. No pleural effusion or pneumothorax. Lungs are clear. There has been resolution of right chest wall emphysema.  IMPRESSION: 1. No complications status post right lower lobectomy. 2. Aortic atherosclerosis.   Electronically Signed   By: Kerby Moors M.D.   On: 05/17/2015 12:51   Dg Colon Alonza Bogus Hi Den Cm  06/08/2015   CLINICAL DATA:  History of colonic polyps, tortuous colon, incomplete colonoscopy previously  EXAM: AIR CONTRAST BARIUM ENEMA  TECHNIQUE: Initial scout AP supine abdominal image obtained to insure adequate colon cleansing. Barium was introduced into the colon in a retrograde fashion and refluxed from the rectum to the distal transverse colon. As much of the barium as possible was then removed through the indwelling tube via gravity drain. Air was then insufflated into the colon. Spot images of the colon followed by overhead radiographs were obtained.  FLUOROSCOPY TIME:  Radiation Exposure Index (as provided by the fluoroscopic device): 87 Gy per sq cm  If the device does not provide the exposure index:  Fluoroscopy Time:  4 minutes 6 seconds  Number of Acquired Images:  COMPARISON:  PET-CT of 04/14/2015 and CT chest of 04/12/2015  FINDINGS: A preliminary film of the abdomen shows no bowel obstruction. Mottled calcification in the bony pelvis is consistent with a calcified uterine fibroid. No acute bony abnormality seen with degenerative change in the lower lumbar spine  present.  A double contrast barium enema was performed. The colon is noted to be extremely elongated and tortuous. The transverse colon distends into the pelvis. There are diverticula primarily within the rectosigmoid colon. A large diverticulum also is noted in the mid descending colon. The only questionable abnormality is on the right lateral decubitus image where there is minimal irregularity to the distal descending colon. However this area is also visualized on one additional view with no abnormality persisting. Other views of this region were either filled with barium or not distended adequately. No definite persistent polypoid lesion or constricting lesion is seen, and only a small amount of retained feces is present primarily within the cecum. The terminal ileum and appendix are unremarkable.  In view  of the elongated tortuous colon in this patient, virtual colonoscopy would be helpful for further assessment in the future.  IMPRESSION: 1. Very elongated and tortuous colon. 2. Diverticula in the rectosigmoid and mid descending colon. 3. No definite polypoid lesion or constricting lesion is seen. In view of the elongation and tortuosity of the colon in this patient, virtual colonoscopy is recommended in the future.   Electronically Signed   By: Ivar Drape M.D.   On: 06/08/2015 09:39    ASSESSMENT: This is a very pleasant 69 years old African-American female who was recently diagnosed with a stage IB (T2a, N0, M0) non-small cell lung cancer, adenocarcinoma with tumor size of 3.2 cm and visceral pleural invasion as well as lymphovascular invasion diagnosed in June 2016. The myriad prognostic test showed high risk for death over 35% in 5 years.   PLAN: I had a lengthy discussion with the patient and her son today about her current disease stage, prognosis and treatment options. I explained to the patient that the standard of care for treatment of her condition based on the size criteria would be  observation and close monitoring because her tumor size is less than 4.0 cm but the patient has a lot of risk factor including visceral pleural invasion in addition to focal lymphovascular invasion and high prognostic risk value of 35% death in 5 years. I gave the patient the option of continuous observation and close monitoring versus consideration of 4 cycles of adjuvant systemic chemotherapy with cisplatin 75 MG/M2 and Alimta 500 MG/M2 every 3 weeks. She is interested in proceeding with systemic chemotherapy. I discussed with the patient adverse effects of this treatment including but not limited to alopecia, myelosuppression, nausea and vomiting, peripheral neuropathy, liver or renal dysfunction. We will arrange for the patient to receive vitamin B 12 injection today. The patient would also receive prescription for Compazine 10 mg by mouth every 6 hours as needed for nausea, Decadron 4 mg by mouth twice a day, the day before, day of and day after the chemotherapy in addition to folic acid 1 mg by mouth daily. I will arrange for the patient to have a chemotherapy education class before starting the first dose of the chemotherapy. The patient is expected to start the first dose of her systemic chemotherapy on 06/22/2015. She will come back for follow-up visit in 3 weeks for reevaluation and management of any adverse effect of her treatment. The patient was advised to call immediately if she has any concerning symptoms in the interval. The patient voices understanding of current disease status and treatment options and is in agreement with the current care plan.  All questions were answered. The patient knows to call the clinic with any problems, questions or concerns. We can certainly see the patient much sooner if necessary.  Thank you so much for allowing me to participate in the care of Betty [name]. I will continue to follow up the patient with you and assist in her care.  I spent 55 minutes  counseling the patient face to face. The total time spent in the appointment was 80 minutes.  Disclaimer: This note was dictated with voice recognition software. Similar sounding words can inadvertently be transcribed and may not be corrected upon review.   Pharrell Ledford K. June 09, 2015, 3:26 PM

## 2015-06-09 NOTE — Progress Notes (Signed)
Checked in new patient with no issues prior to seeing the dr. She has not traveled. 

## 2015-06-14 ENCOUNTER — Other Ambulatory Visit: Payer: Medicare Other

## 2015-06-14 ENCOUNTER — Encounter: Payer: Self-pay | Admitting: *Deleted

## 2015-06-16 ENCOUNTER — Other Ambulatory Visit: Payer: Self-pay | Admitting: *Deleted

## 2015-06-17 ENCOUNTER — Encounter: Payer: Self-pay | Admitting: Internal Medicine

## 2015-06-17 NOTE — Progress Notes (Signed)
Rcvd referral from [facility] regarding possible financial concerns.  Spoke w/ pt and she doesn't need assistance with anything at the moment.  She has 2 insurances so she should be covered at 100%.  I informed her of the [facility] and what it can assist with.  She said between her son and brother she has what she needs so she's not interested in applying for that.  I assured her if she changes her mind to contact me.  She verbalized understanding.

## 2015-06-21 ENCOUNTER — Other Ambulatory Visit: Payer: Self-pay | Admitting: Medical Oncology

## 2015-06-21 DIAGNOSIS — C3491 Malignant neoplasm of unspecified part of right bronchus or lung: Secondary | ICD-10-CM

## 2015-06-22 ENCOUNTER — Ambulatory Visit: Payer: Medicare Other

## 2015-06-22 ENCOUNTER — Other Ambulatory Visit (HOSPITAL_BASED_OUTPATIENT_CLINIC_OR_DEPARTMENT_OTHER): Payer: Medicare Other

## 2015-06-22 DIAGNOSIS — C3431 Malignant neoplasm of lower lobe, right bronchus or lung: Secondary | ICD-10-CM

## 2015-06-22 DIAGNOSIS — C3491 Malignant neoplasm of unspecified part of right bronchus or lung: Secondary | ICD-10-CM

## 2015-06-22 DIAGNOSIS — C782 Secondary malignant neoplasm of pleura: Secondary | ICD-10-CM

## 2015-06-22 LAB — COMPREHENSIVE METABOLIC PANEL (CC13)
ALT: 18 U/L (ref 0–55)
ANION GAP: 11 meq/L (ref 3–11)
AST: 18 U/L (ref 5–34)
Albumin: 4 g/dL (ref 3.5–5.0)
Alkaline Phosphatase: 87 U/L (ref 40–150)
BUN: 37.1 mg/dL — ABNORMAL HIGH (ref 7.0–26.0)
CO2: 21 meq/L — AB (ref 22–29)
Calcium: 10.4 mg/dL (ref 8.4–10.4)
Chloride: 108 mEq/L (ref 98–109)
Creatinine: 2.1 mg/dL — ABNORMAL HIGH (ref 0.6–1.1)
EGFR: 27 mL/min/{1.73_m2} — AB (ref >90)
Glucose: 117 mg/dl (ref 70–140)
POTASSIUM: 4.2 meq/L (ref 3.5–5.1)
Sodium: 141 mEq/L (ref 136–145)
Total Bilirubin: 0.23 mg/dL (ref 0.20–1.20)
Total Protein: 7.4 g/dL (ref 6.4–8.3)

## 2015-06-22 LAB — CBC WITH DIFFERENTIAL/PLATELET
BASO%: 0.7 % (ref 0.0–2.0)
Basophils Absolute: 0.1 10*3/uL (ref 0.0–0.1)
EOS ABS: 0 10*3/uL (ref 0.0–0.5)
EOS%: 0 % (ref 0.0–7.0)
HEMATOCRIT: 33.3 % — AB (ref 34.8–46.6)
HEMOGLOBIN: 11.2 g/dL — AB (ref 11.6–15.9)
LYMPH#: 2.2 10*3/uL (ref 0.9–3.3)
LYMPH%: 17.7 % (ref 14.0–49.7)
MCH: 31.6 pg (ref 25.1–34.0)
MCHC: 33.7 g/dL (ref 31.5–36.0)
MCV: 93.8 fL (ref 79.5–101.0)
MONO#: 0.8 10*3/uL (ref 0.1–0.9)
MONO%: 6.2 % (ref 0.0–14.0)
NEUT#: 9.4 10*3/uL — ABNORMAL HIGH (ref 1.5–6.5)
NEUT%: 75.4 % (ref 38.4–76.8)
Platelets: 386 10*3/uL (ref 145–400)
RBC: 3.55 10*6/uL — ABNORMAL LOW (ref 3.70–5.45)
RDW: 12.3 % (ref 11.2–14.5)
WBC: 12.5 10*3/uL — ABNORMAL HIGH (ref 3.9–10.3)

## 2015-06-22 NOTE — Patient Instructions (Addendum)
No chemo today d/t labs.

## 2015-06-22 NOTE — Progress Notes (Signed)
Reviewed labs. BUN 37.1 and Creatinine 2.1. Notified Dr. Julien Nordmann

## 2015-06-22 NOTE — Progress Notes (Signed)
Reviewed labs. BUN 37.1 and Creatinine 2.1 Dr. Julien Nordmann notified. Received orders to hold chemo today and have pt come back for schedule labs on 06/29/15. Explained situation to pt and she and her son voiced understanding. Advised patient to increase fluid/water intake. Pt voiced understanding.

## 2015-06-29 ENCOUNTER — Ambulatory Visit (HOSPITAL_BASED_OUTPATIENT_CLINIC_OR_DEPARTMENT_OTHER): Payer: Medicare Other | Admitting: Physician Assistant

## 2015-06-29 ENCOUNTER — Other Ambulatory Visit (HOSPITAL_BASED_OUTPATIENT_CLINIC_OR_DEPARTMENT_OTHER): Payer: Medicare Other

## 2015-06-29 ENCOUNTER — Other Ambulatory Visit: Payer: Medicare Other

## 2015-06-29 VITALS — BP 142/71 | HR 90 | Temp 98.7°F | Resp 16 | Ht 64.0 in | Wt 124.5 lb

## 2015-06-29 DIAGNOSIS — Z902 Acquired absence of lung [part of]: Secondary | ICD-10-CM

## 2015-06-29 DIAGNOSIS — C3431 Malignant neoplasm of lower lobe, right bronchus or lung: Secondary | ICD-10-CM | POA: Diagnosis not present

## 2015-06-29 DIAGNOSIS — C782 Secondary malignant neoplasm of pleura: Secondary | ICD-10-CM | POA: Diagnosis not present

## 2015-06-29 DIAGNOSIS — C3491 Malignant neoplasm of unspecified part of right bronchus or lung: Secondary | ICD-10-CM | POA: Diagnosis present

## 2015-06-29 LAB — CBC WITH DIFFERENTIAL/PLATELET
BASO%: 0.3 % (ref 0.0–2.0)
Basophils Absolute: 0 10*3/uL (ref 0.0–0.1)
EOS%: 3.1 % (ref 0.0–7.0)
Eosinophils Absolute: 0.3 10*3/uL (ref 0.0–0.5)
HCT: 32.8 % — ABNORMAL LOW (ref 34.8–46.6)
HGB: 11.2 g/dL — ABNORMAL LOW (ref 11.6–15.9)
LYMPH%: 40.2 % (ref 14.0–49.7)
MCH: 31.3 pg (ref 25.1–34.0)
MCHC: 34.1 g/dL (ref 31.5–36.0)
MCV: 91.6 fL (ref 79.5–101.0)
MONO#: 0.7 10*3/uL (ref 0.1–0.9)
MONO%: 7.7 % (ref 0.0–14.0)
NEUT%: 48.7 % (ref 38.4–76.8)
NEUTROS ABS: 4.4 10*3/uL (ref 1.5–6.5)
PLATELETS: 343 10*3/uL (ref 145–400)
RBC: 3.58 10*6/uL — ABNORMAL LOW (ref 3.70–5.45)
RDW: 12 % (ref 11.2–14.5)
WBC: 9.1 10*3/uL (ref 3.9–10.3)
lymph#: 3.7 10*3/uL — ABNORMAL HIGH (ref 0.9–3.3)

## 2015-06-29 LAB — COMPREHENSIVE METABOLIC PANEL (CC13)
ALBUMIN: 3.8 g/dL (ref 3.5–5.0)
ALK PHOS: 93 U/L (ref 40–150)
ALT: 20 U/L (ref 0–55)
AST: 18 U/L (ref 5–34)
Anion Gap: 10 mEq/L (ref 3–11)
BILIRUBIN TOTAL: 0.27 mg/dL (ref 0.20–1.20)
BUN: 41.5 mg/dL — AB (ref 7.0–26.0)
CALCIUM: 9.7 mg/dL (ref 8.4–10.4)
CO2: 23 mEq/L (ref 22–29)
Chloride: 107 mEq/L (ref 98–109)
Creatinine: 2.2 mg/dL — ABNORMAL HIGH (ref 0.6–1.1)
EGFR: 25 mL/min/{1.73_m2} — AB (ref >90)
Glucose: 94 mg/dl (ref 70–140)
POTASSIUM: 4.7 meq/L (ref 3.5–5.1)
Sodium: 140 mEq/L (ref 136–145)
TOTAL PROTEIN: 6.9 g/dL (ref 6.4–8.3)

## 2015-06-29 LAB — TECHNOLOGIST REVIEW

## 2015-06-30 ENCOUNTER — Telehealth: Payer: Self-pay | Admitting: Internal Medicine

## 2015-06-30 ENCOUNTER — Telehealth: Payer: Self-pay | Admitting: *Deleted

## 2015-06-30 NOTE — Telephone Encounter (Signed)
Left message to confirm appointment for 08/31 & 09/07

## 2015-06-30 NOTE — Telephone Encounter (Signed)
Oncology Nurse Navigator Documentation  Oncology Nurse Navigator Flowsheets 06/30/2015  Navigator Encounter Type Telephone/I called patient today to follow up with her.  I called if needed and left a vm message to call with my name and phone number   Patient Visit Type Follow-up  Treatment Phase Active Treatment   Time Spent with Patient 15

## 2015-07-05 ENCOUNTER — Telehealth: Payer: Self-pay | Admitting: Medical Oncology

## 2015-07-05 NOTE — Telephone Encounter (Signed)
appts confirmed.  

## 2015-07-06 ENCOUNTER — Ambulatory Visit (HOSPITAL_BASED_OUTPATIENT_CLINIC_OR_DEPARTMENT_OTHER): Payer: Medicare Other

## 2015-07-06 ENCOUNTER — Other Ambulatory Visit (HOSPITAL_BASED_OUTPATIENT_CLINIC_OR_DEPARTMENT_OTHER): Payer: Medicare Other

## 2015-07-06 VITALS — BP 131/79 | HR 100 | Temp 98.2°F | Resp 18

## 2015-07-06 DIAGNOSIS — C3431 Malignant neoplasm of lower lobe, right bronchus or lung: Secondary | ICD-10-CM

## 2015-07-06 DIAGNOSIS — C3491 Malignant neoplasm of unspecified part of right bronchus or lung: Secondary | ICD-10-CM

## 2015-07-06 DIAGNOSIS — C782 Secondary malignant neoplasm of pleura: Secondary | ICD-10-CM

## 2015-07-06 DIAGNOSIS — Z5111 Encounter for antineoplastic chemotherapy: Secondary | ICD-10-CM

## 2015-07-06 LAB — COMPREHENSIVE METABOLIC PANEL (CC13)
ALT: 17 U/L (ref 0–55)
AST: 18 U/L (ref 5–34)
Albumin: 4 g/dL (ref 3.5–5.0)
Alkaline Phosphatase: 94 U/L (ref 40–150)
Anion Gap: 10 mEq/L (ref 3–11)
BUN: 43.3 mg/dL — AB (ref 7.0–26.0)
CALCIUM: 9.8 mg/dL (ref 8.4–10.4)
CHLORIDE: 107 meq/L (ref 98–109)
CO2: 23 mEq/L (ref 22–29)
Creatinine: 2.2 mg/dL — ABNORMAL HIGH (ref 0.6–1.1)
EGFR: 26 mL/min/{1.73_m2} — ABNORMAL LOW (ref >90)
Glucose: 116 mg/dl (ref 70–140)
Potassium: 4.7 mEq/L (ref 3.5–5.1)
Sodium: 140 mEq/L (ref 136–145)
Total Bilirubin: 0.28 mg/dL (ref 0.20–1.20)
Total Protein: 7.3 g/dL (ref 6.4–8.3)

## 2015-07-06 LAB — CBC WITH DIFFERENTIAL/PLATELET
BASO%: 0.8 % (ref 0.0–2.0)
Basophils Absolute: 0.1 10*3/uL (ref 0.0–0.1)
EOS ABS: 0 10*3/uL (ref 0.0–0.5)
EOS%: 0.1 % (ref 0.0–7.0)
HCT: 33.8 % — ABNORMAL LOW (ref 34.8–46.6)
HEMOGLOBIN: 11.3 g/dL — AB (ref 11.6–15.9)
LYMPH%: 16 % (ref 14.0–49.7)
MCH: 31.3 pg (ref 25.1–34.0)
MCHC: 33.5 g/dL (ref 31.5–36.0)
MCV: 93.5 fL (ref 79.5–101.0)
MONO#: 0.1 10*3/uL (ref 0.1–0.9)
MONO%: 1.1 % (ref 0.0–14.0)
NEUT%: 82 % — ABNORMAL HIGH (ref 38.4–76.8)
NEUTROS ABS: 6.9 10*3/uL — AB (ref 1.5–6.5)
PLATELETS: 383 10*3/uL (ref 145–400)
RBC: 3.61 10*6/uL — ABNORMAL LOW (ref 3.70–5.45)
RDW: 12.1 % (ref 11.2–14.5)
WBC: 8.3 10*3/uL (ref 3.9–10.3)
lymph#: 1.3 10*3/uL (ref 0.9–3.3)

## 2015-07-06 MED ORDER — SODIUM CHLORIDE 0.9 % IV SOLN
231.0000 mg | Freq: Once | INTRAVENOUS | Status: AC
  Administered 2015-07-06: 230 mg via INTRAVENOUS
  Filled 2015-07-06: qty 23

## 2015-07-06 MED ORDER — SODIUM CHLORIDE 0.9 % IV SOLN
Freq: Once | INTRAVENOUS | Status: AC
  Administered 2015-07-06: 15:00:00 via INTRAVENOUS

## 2015-07-06 MED ORDER — SODIUM CHLORIDE 0.9 % IV SOLN
1600.0000 mg | Freq: Once | INTRAVENOUS | Status: AC
  Administered 2015-07-06: 1600 mg via INTRAVENOUS
  Filled 2015-07-06: qty 42.08

## 2015-07-06 MED ORDER — SODIUM CHLORIDE 0.9 % IV SOLN
Freq: Once | INTRAVENOUS | Status: AC
  Administered 2015-07-06: 15:00:00 via INTRAVENOUS
  Filled 2015-07-06: qty 8

## 2015-07-06 NOTE — Progress Notes (Signed)
OK to treat with BUN-43.3 and Creat-2.2 per Dr. Julien Nordmann

## 2015-07-06 NOTE — Patient Instructions (Signed)
[facility] Discharge Instructions for Patients Receiving Chemotherapy  Today you received the following chemotherapy agents: Gemzar and Carboplatin   To help prevent nausea and vomiting after your treatment, we encourage you to take your nausea medication as directed.  --Compazine: Take 1 tablet (10 mg total) by mouth every 6  hours as needed for nausea or vomiting.   If you develop nausea and vomiting that is not controlled by your nausea medication, call the clinic.   BELOW ARE SYMPTOMS THAT SHOULD BE REPORTED IMMEDIATELY:  *FEVER GREATER THAN 100.5 F  *CHILLS WITH OR WITHOUT FEVER  NAUSEA AND VOMITING THAT IS NOT CONTROLLED WITH YOUR NAUSEA MEDICATION  *UNUSUAL SHORTNESS OF BREATH  *UNUSUAL BRUISING OR BLEEDING  TENDERNESS IN MOUTH AND THROAT WITH OR WITHOUT PRESENCE OF ULCERS  *URINARY PROBLEMS  *BOWEL PROBLEMS  UNUSUAL RASH Items with * indicate a potential emergency and should be followed up as soon as possible.  Feel free to call the clinic you have any questions or concerns. The clinic phone number is [phone].  Please show the [facility] at check-in to the Emergency Department and triage nurse.  Gemcitabine injection What is this medicine? GEMCITABINE (jem SIT a been) is a chemotherapy drug. This medicine is used to treat many types of cancer like breast cancer, lung cancer, pancreatic cancer, and ovarian cancer. This medicine may be used for other purposes; ask your health care provider or pharmacist if you have questions. COMMON BRAND NAME(S): Gemzar What should I tell my health care provider before I take this medicine? They need to know if you have any of these conditions: -blood disorders -infection -kidney disease -liver disease -recent or ongoing radiation therapy -an unusual or allergic reaction to gemcitabine, other chemotherapy, other medicines, foods, dyes, or preservatives -pregnant or trying to get  pregnant -breast-feeding How should I use this medicine? This drug is given as an infusion into a vein. It is administered in a hospital or clinic by a specially trained health care professional. Talk to your pediatrician regarding the use of this medicine in children. Special care may be needed. Overdosage: If you think you have taken too much of this medicine contact a poison control center or emergency room at once. NOTE: This medicine is only for you. Do not share this medicine with others. What if I miss a dose? It is important not to miss your dose. Call your doctor or health care professional if you are unable to keep an appointment. What may interact with this medicine? -medicines to increase blood counts like filgrastim, pegfilgrastim, sargramostim -some other chemotherapy drugs like cisplatin -vaccines Talk to your doctor or health care professional before taking any of these medicines: -acetaminophen -aspirin -ibuprofen -ketoprofen -naproxen This list may not describe all possible interactions. Give your health care provider a list of all the medicines, herbs, non-prescription drugs, or dietary supplements you use. Also tell them if you smoke, drink alcohol, or use illegal drugs. Some items may interact with your medicine. What should I watch for while using this medicine? Visit your doctor for checks on your progress. This drug may make you feel generally unwell. This is not uncommon, as chemotherapy can affect healthy cells as well as cancer cells. Report any side effects. Continue your course of treatment even though you feel ill unless your doctor tells you to stop. In some cases, you may be given additional medicines to help with side effects. Follow all directions for their use. Call your doctor  or health care professional for advice if you get a fever, chills or sore throat, or other symptoms of a cold or flu. Do not treat yourself. This drug decreases your body's ability to  fight infections. Try to avoid being around people who are sick. This medicine may increase your risk to bruise or bleed. Call your doctor or health care professional if you notice any unusual bleeding. Be careful brushing and flossing your teeth or using a toothpick because you may get an infection or bleed more easily. If you have any dental work done, tell your dentist you are receiving this medicine. Avoid taking products that contain aspirin, acetaminophen, ibuprofen, naproxen, or ketoprofen unless instructed by your doctor. These medicines may hide a fever. Women should inform their doctor if they wish to become pregnant or think they might be pregnant. There is a potential for serious side effects to an unborn child. Talk to your health care professional or pharmacist for more information. Do not breast-feed an infant while taking this medicine. What side effects may I notice from receiving this medicine? Side effects that you should report to your doctor or health care professional as soon as possible: -allergic reactions like skin rash, itching or hives, swelling of the face, lips, or tongue -low blood counts - this medicine may decrease the number of white blood cells, red blood cells and platelets. You may be at increased risk for infections and bleeding. -signs of infection - fever or chills, cough, sore throat, pain or difficulty passing urine -signs of decreased platelets or bleeding - bruising, pinpoint red spots on the skin, black, tarry stools, blood in the urine -signs of decreased red blood cells - unusually weak or tired, fainting spells, lightheadedness -breathing problems -chest pain -mouth sores -nausea and vomiting -pain, swelling, redness at site where injected -pain, tingling, numbness in the hands or feet -stomach pain -swelling of ankles, feet, hands -unusual bleeding Side effects that usually do not require medical attention (report to your doctor or health care  professional if they continue or are bothersome): -constipation -diarrhea -hair loss -loss of appetite -stomach upset This list may not describe all possible side effects. Call your doctor for medical advice about side effects. You may report side effects to FDA at 1-800-FDA-1088. Where should I keep my medicine? This drug is given in a hospital or clinic and will not be stored at home. NOTE: This sheet is a summary. It may not cover all possible information. If you have questions about this medicine, talk to your doctor, pharmacist, or health care provider.  2015, Elsevier/Gold Standard. (2008-03-02 18:45:54)    Carboplatin injection What is this medicine? CARBOPLATIN (KAR boe pla tin) is a chemotherapy drug. It targets fast dividing cells, like cancer cells, and causes these cells to die. This medicine is used to treat ovarian cancer and many other cancers. This medicine may be used for other purposes; ask your health care provider or pharmacist if you have questions. COMMON BRAND NAME(S): Paraplatin What should I tell my health care provider before I take this medicine? They need to know if you have any of these conditions: -blood disorders -hearing problems -kidney disease -recent or ongoing radiation therapy -an unusual or allergic reaction to carboplatin, cisplatin, other chemotherapy, other medicines, foods, dyes, or preservatives -pregnant or trying to get pregnant -breast-feeding How should I use this medicine? This drug is usually given as an infusion into a vein. It is administered in a hospital or clinic by a specially  trained health care professional. Talk to your pediatrician regarding the use of this medicine in children. Special care may be needed. Overdosage: If you think you have taken too much of this medicine contact a poison control center or emergency room at once. NOTE: This medicine is only for you. Do not share this medicine with others. What if I miss a  dose? It is important not to miss a dose. Call your doctor or health care professional if you are unable to keep an appointment. What may interact with this medicine? -medicines for seizures -medicines to increase blood counts like filgrastim, pegfilgrastim, sargramostim -some antibiotics like amikacin, gentamicin, neomycin, streptomycin, tobramycin -vaccines Talk to your doctor or health care professional before taking any of these medicines: -acetaminophen -aspirin -ibuprofen -ketoprofen -naproxen This list may not describe all possible interactions. Give your health care provider a list of all the medicines, herbs, non-prescription drugs, or dietary supplements you use. Also tell them if you smoke, drink alcohol, or use illegal drugs. Some items may interact with your medicine. What should I watch for while using this medicine? Your condition will be monitored carefully while you are receiving this medicine. You will need important blood work done while you are taking this medicine. This drug may make you feel generally unwell. This is not uncommon, as chemotherapy can affect healthy cells as well as cancer cells. Report any side effects. Continue your course of treatment even though you feel ill unless your doctor tells you to stop. In some cases, you may be given additional medicines to help with side effects. Follow all directions for their use. Call your doctor or health care professional for advice if you get a fever, chills or sore throat, or other symptoms of a cold or flu. Do not treat yourself. This drug decreases your body's ability to fight infections. Try to avoid being around people who are sick. This medicine may increase your risk to bruise or bleed. Call your doctor or health care professional if you notice any unusual bleeding. Be careful brushing and flossing your teeth or using a toothpick because you may get an infection or bleed more easily. If you have any dental work  done, tell your dentist you are receiving this medicine. Avoid taking products that contain aspirin, acetaminophen, ibuprofen, naproxen, or ketoprofen unless instructed by your doctor. These medicines may hide a fever. Do not become pregnant while taking this medicine. Women should inform their doctor if they wish to become pregnant or think they might be pregnant. There is a potential for serious side effects to an unborn child. Talk to your health care professional or pharmacist for more information. Do not breast-feed an infant while taking this medicine. What side effects may I notice from receiving this medicine? Side effects that you should report to your doctor or health care professional as soon as possible: -allergic reactions like skin rash, itching or hives, swelling of the face, lips, or tongue -signs of infection - fever or chills, cough, sore throat, pain or difficulty passing urine -signs of decreased platelets or bleeding - bruising, pinpoint red spots on the skin, black, tarry stools, nosebleeds -signs of decreased red blood cells - unusually weak or tired, fainting spells, lightheadedness -breathing problems -changes in hearing -changes in vision -chest pain -high blood pressure -low blood counts - This drug may decrease the number of white blood cells, red blood cells and platelets. You may be at increased risk for infections and bleeding. -nausea and vomiting -pain, swelling,  redness or irritation at the injection site -pain, tingling, numbness in the hands or feet -problems with balance, talking, walking -trouble passing urine or change in the amount of urine Side effects that usually do not require medical attention (report to your doctor or health care professional if they continue or are bothersome): -hair loss -loss of appetite -metallic taste in the mouth or changes in taste This list may not describe all possible side effects. Call your doctor for medical advice  about side effects. You may report side effects to FDA at 1-800-FDA-1088. Where should I keep my medicine? This drug is given in a hospital or clinic and will not be stored at home. NOTE: This sheet is a summary. It may not cover all possible information. If you have questions about this medicine, talk to your doctor, pharmacist, or health care provider.  2015, Elsevier/Gold Standard. (2008-01-27 14:38:05)

## 2015-07-06 NOTE — Patient Instructions (Signed)
Return in one week to start your adjuvant chemotherapy Follow up in 2 weeks for a symptom management visit

## 2015-07-06 NOTE — Progress Notes (Signed)
No images are attached to the encounter. No scans are attached to the encounter. No scans are attached to the encounter. [facility] NOTE  GATES,ROBERT NEVILL, MD [address] [place]  [address]  DIAGNOSIS: Non-small cell carcinoma of right lung, stage 1   Staging form: Lung, AJCC 7th Edition     Clinical: Stage IB (T2a, N0, M0) - Unsigned  PRIOR THERAPY: Status post right upper thorascoscopic lobectomy on 04/29/2015 under the care of Dr. Roxan Hockey  CURRENT THERAPY: Adjuvant chemotherapy with carboplatin AUC 5 given on day 1 and gemcitabine 1000 mg/m2 given on days 1 and 8 every 3 weeks  INTERVAL HISTORY: Betty [name] 69 y.o. female returns for a scheduled regular follow up visit for followup of her recently diagnosed stage IB non small cell lung cancer. She is status post right upper lobectomy and presents to discuss her adjuvant treatment options.  She voices no specific complaints today. She is adamant that her chemotherapy not make her lose her hair.   MEDICAL HISTORY: Past Medical History  Diagnosis Date  . Hypertension   . Hypercholesterolemia   . Tobacco dependence   . GERD (gastroesophageal reflux disease)   . Sciatica of right side   . Seasonal allergic rhinitis   . Acute mastitis of right breast     10/2003  . Family history of breast cancer   . Colon polyps   . Tubular adenoma of colon   . Vitamin D deficiency   . COPD (chronic obstructive pulmonary disease)     emphysema  . Arthritis     bursitis in right shoulder  . Chronic kidney disease     CKD stage 3 04/28/15    ALLERGIES:  is allergic to sulfa antibiotics.  MEDICATIONS:  Current Outpatient Prescriptions  Medication Sig Dispense Refill  . acetaminophen (TYLENOL) 325 MG tablet Take 650 mg by mouth every 6 (six) hours as needed.    Marland Kitchen aspirin 81 MG tablet Take 81 mg by mouth daily.    . cholecalciferol (VITAMIN D) 1000 UNITS tablet Take 1,000 Units by  mouth daily.    Marland Kitchen lisinopril-hydrochlorothiazide (PRINZIDE,ZESTORETIC) 20-12.5 MG per tablet Take 1 tablet by mouth daily.     Marland Kitchen loratadine (CLARITIN) 10 MG tablet Take 10 mg by mouth daily as needed.     . metoprolol succinate (TOPROL-XL) 50 MG 24 hr tablet Take 50 mg by mouth daily.     . Multiple Vitamins-Minerals (MULTIVITAMIN PO) Take 1 tablet by mouth daily.    . prochlorperazine (COMPAZINE) 10 MG tablet Take 1 tablet (10 mg total) by mouth every 6 (six) hours as needed for nausea or vomiting. 30 tablet 0  . simvastatin (ZOCOR) 20 MG tablet Take 20 mg by mouth 3 (three) times a week. Mon Wed Fri    . traMADol (ULTRAM) 50 MG tablet Take 1-2 tablets (50-100 mg total) by mouth 3 (three) times daily as needed. 30 tablet 0   No current facility-administered medications for this visit.    SURGICAL HISTORY:  Past Surgical History  Procedure Laterality Date  . Vaginal delivery      51 yrs ago  . Breast surgery      small mass removed from left breast--benign  . Video assisted thoracoscopy (vats)/ lobectomy Right 04/29/2015    Procedure: RIGHT VIDEO ASSISTED THORACOSCOPY (VATS) WEDGE RESECTION/ RIGHT LOWER LOBECTOMY, CRYO-ANALGESIA OF INTERCOSTAL NERVES;  Surgeon: [name], MD;  Location: [facility];  Service: Thoracic;  Laterality: Right;  . Cryo intercostal nerve block Right 04/29/2015    Procedure: CRYO INTERCOSTAL NERVE BLOCK;  Surgeon: [name], MD;  Location: [facility];  Service: Thoracic;  Laterality: Right;  . Lobectomy Right 04/29/2015    Procedure: RIGHT LOWER LUNG LOBECTOMY ;  Surgeon: [name], MD;  Location: [facility];  Service: Thoracic;  Laterality: Right;  . Lymph node dissection Right 04/29/2015    Procedure: RIGHT LUNG LYMPH NODE DISSECTION;  Surgeon: [name], MD;  Location: [facility];  Service: Thoracic;  Laterality: Right;    REVIEW OF SYSTEMS:  Review of Systems  Constitutional: Negative for fever, chills, weight loss, malaise/fatigue and  diaphoresis.  HENT: Negative for congestion, ear discharge, ear pain, hearing loss, nosebleeds, sore throat and tinnitus.   Eyes: Negative for blurred vision, double vision, photophobia, pain, discharge and redness.  Respiratory: Negative for cough, hemoptysis, sputum production, shortness of breath, wheezing and stridor.   Cardiovascular: Negative for chest pain, palpitations, orthopnea, claudication, leg swelling and PND.  Gastrointestinal: Negative for heartburn, nausea, vomiting, abdominal pain, diarrhea, constipation, blood in stool and melena.  Genitourinary: Negative.   Musculoskeletal: Negative.   Skin: Negative.   Neurological: Negative for dizziness, tingling, focal weakness, seizures, weakness and headaches.  Endo/Heme/Allergies: Does not bruise/bleed easily.  Psychiatric/Behavioral: Negative for depression. The patient is not nervous/anxious and does not have insomnia.      PHYSICAL EXAMINATION: Physical Exam  Constitutional: She is oriented to person, place, and time and well-developed, well-nourished, and in no distress.  HENT:  Head: Normocephalic and atraumatic.  Mouth/Throat: Oropharynx is clear and moist.  Eyes: Pupils are equal, round, and reactive to light.  Neck: Normal range of motion. Neck supple. No JVD present. No tracheal deviation present. No thyromegaly present.  Cardiovascular: Normal rate, regular rhythm, normal heart sounds and intact distal pulses.  Exam reveals no gallop and no friction rub.   No murmur heard. Pulmonary/Chest: Effort normal and breath sounds normal. No respiratory distress. She has no wheezes. She has no rales.  Abdominal: Soft. Bowel sounds are normal. She exhibits no distension and no mass. There is no tenderness.  Musculoskeletal: Normal range of motion. She exhibits no edema or tenderness.  Lymphadenopathy:    She has no cervical adenopathy.  Neurological: She is alert and oriented to person, place, and time. She has normal reflexes.  Gait normal.  Skin: Skin is warm and dry. No rash noted.    ECOG PERFORMANCE STATUS: 0 - Asymptomatic  Blood pressure 142/71, pulse 90, temperature 98.7 F (37.1 C), temperature source Oral, resp. rate 16, height '5\' 4"'$  (1.626 m), weight 124 lb 8 oz (56.473 kg), SpO2 96 %.  LABORATORY DATA: Lab Results  Component Value Date   WBC 8.3 07/06/2015   HGB 11.3* 07/06/2015   HCT 33.8* 07/06/2015   MCV 93.5 07/06/2015   PLT 383 07/06/2015      Chemistry      Component Value Date/Time   NA 140 07/06/2015 1344   NA 138 05/02/2015 0600   K 4.7 07/06/2015 1344   K 4.0 05/02/2015 0600   CL 112* 05/02/2015 0600   CO2 23 07/06/2015 1344   CO2 22 05/02/2015 0600   BUN 43.3* 07/06/2015 1344   BUN 16 05/02/2015 0600   CREATININE 2.2* 07/06/2015 1344   CREATININE 1.76* 05/02/2015 0600      Component Value Date/Time   CALCIUM 9.8 07/06/2015 1344   CALCIUM 8.7* 05/02/2015 0600   ALKPHOS 94 07/06/2015 1344  ALKPHOS 56 05/01/2015 0558   AST 18 07/06/2015 1344   AST 31 05/01/2015 0558   ALT 17 07/06/2015 1344   ALT 14 05/01/2015 0558   BILITOT 0.28 07/06/2015 1344   BILITOT 0.4 05/01/2015 0558       RADIOGRAPHIC STUDIES:  Dg Colon Alonza Bogus Hi Den Cm  06/08/2015   CLINICAL DATA:  History of colonic polyps, tortuous colon, incomplete colonoscopy previously  EXAM: AIR CONTRAST BARIUM ENEMA  TECHNIQUE: Initial scout AP supine abdominal image obtained to insure adequate colon cleansing. Barium was introduced into the colon in a retrograde fashion and refluxed from the rectum to the distal transverse colon. As much of the barium as possible was then removed through the indwelling tube via gravity drain. Air was then insufflated into the colon. Spot images of the colon followed by overhead radiographs were obtained.  FLUOROSCOPY TIME:  Radiation Exposure Index (as provided by the fluoroscopic device): 87 Gy per sq cm  If the device does not provide the exposure index:  Fluoroscopy Time:  4 minutes  6 seconds  Number of Acquired Images:  COMPARISON:  PET-CT of 04/14/2015 and CT chest of 04/12/2015  FINDINGS: A preliminary film of the abdomen shows no bowel obstruction. Mottled calcification in the bony pelvis is consistent with a calcified uterine fibroid. No acute bony abnormality seen with degenerative change in the lower lumbar spine present.  A double contrast barium enema was performed. The colon is noted to be extremely elongated and tortuous. The transverse colon distends into the pelvis. There are diverticula primarily within the rectosigmoid colon. A large diverticulum also is noted in the mid descending colon. The only questionable abnormality is on the right lateral decubitus image where there is minimal irregularity to the distal descending colon. However this area is also visualized on one additional view with no abnormality persisting. Other views of this region were either filled with barium or not distended adequately. No definite persistent polypoid lesion or constricting lesion is seen, and only a small amount of retained feces is present primarily within the cecum. The terminal ileum and appendix are unremarkable.  In view of the elongated tortuous colon in this patient, virtual colonoscopy would be helpful for further assessment in the future.  IMPRESSION: 1. Very elongated and tortuous colon. 2. Diverticula in the rectosigmoid and mid descending colon. 3. No definite polypoid lesion or constricting lesion is seen. In view of the elongation and tortuosity of the colon in this patient, virtual colonoscopy is recommended in the future.   Electronically Signed   By: Ivar Drape M.D.   On: 06/08/2015 09:39     ASSESSMENT/PLAN:  No problem-specific assessment & plan notes found for this encounter. The patient is a pleasant 69 year old African American female recently diagnosed with stage IB non small cell lung cancer, status post right upper lobectomy. She presented to discuss her adjuvant  chemotherapy options. Dr. Julien Nordmann initially had planned to use cisplatin and ALimta but the patient has an elevated baseline creatinine level. She will be treated with carboplatin for AUC 5 on day 1 and gemcitabine at 1000 mg/m2 given on days 1 and 8 every 3 weeks. First cycle expected next week. She will be seen in 2 weeks for a symptom management visit to assess any toxicities of her chemotherapy.  Awilda Metro E, PA-C 07/06/2015     All questions were answered. The patient knows to call the clinic with any problems, questions or concerns. We can certainly see the patient  much sooner if necessary.  ADDENDUM: Hematology/Oncology Attending: I had a face to face encounter with the patient. I recommended her care plan. This is a very pleasant 69 years old African-American female recently diagnosed with a stage IB non-small cell lung cancer, adenocarcinoma status post right upper lobectomy. The patient was supposed to start the first cycle of adjuvant chemotherapy with cisplatin and Alimta last week but she was found to have significant elevation of her serum creatinine and she was not eligible to receive this treatment. I had a lengthy discussion with the patient today about her current treatment options and I gave her the option of treatment with carboplatin and paclitaxel versus carboplatin and gemcitabine. The patient is not interested in losing her hair and she preferred the regimen consisting of carboplatin and gemcitabine. She is expected to start the first cycle of this treatment next week. I discussed with the patient adverse effect of this treatment including mild alopecia, myelosuppression, nausea and vomiting, peripheral neuropathy, liver or renal dysfunction. She will come back for follow-up visit in 2 weeks for reevaluation and management of any adverse effect of her treatment. The patient was advised to call immediately if she has any concerning symptoms in the interval.  Disclaimer:  This note was dictated with voice recognition software. Similar sounding words can inadvertently be transcribed and may be missed upon review.  Eilleen Kempf., MD 07/06/2015

## 2015-07-07 ENCOUNTER — Telehealth: Payer: Self-pay | Admitting: Medical Oncology

## 2015-07-07 NOTE — Telephone Encounter (Signed)
I left a message for pt to return call and let triage or me know who she did after chemo.

## 2015-07-12 ENCOUNTER — Other Ambulatory Visit: Payer: Medicare Other

## 2015-07-12 ENCOUNTER — Ambulatory Visit: Payer: Medicare Other | Admitting: Physician Assistant

## 2015-07-13 ENCOUNTER — Ambulatory Visit (HOSPITAL_BASED_OUTPATIENT_CLINIC_OR_DEPARTMENT_OTHER): Payer: Medicare Other

## 2015-07-13 ENCOUNTER — Other Ambulatory Visit (HOSPITAL_BASED_OUTPATIENT_CLINIC_OR_DEPARTMENT_OTHER): Payer: Medicare Other

## 2015-07-13 ENCOUNTER — Ambulatory Visit (HOSPITAL_BASED_OUTPATIENT_CLINIC_OR_DEPARTMENT_OTHER): Payer: Medicare Other | Admitting: Physician Assistant

## 2015-07-13 ENCOUNTER — Ambulatory Visit: Payer: Medicare Other

## 2015-07-13 ENCOUNTER — Encounter: Payer: Self-pay | Admitting: Physician Assistant

## 2015-07-13 VITALS — BP 119/53 | HR 96 | Temp 98.1°F | Resp 18 | Ht 64.0 in | Wt 122.3 lb

## 2015-07-13 DIAGNOSIS — R110 Nausea: Secondary | ICD-10-CM | POA: Diagnosis not present

## 2015-07-13 DIAGNOSIS — C782 Secondary malignant neoplasm of pleura: Secondary | ICD-10-CM

## 2015-07-13 DIAGNOSIS — C3431 Malignant neoplasm of lower lobe, right bronchus or lung: Secondary | ICD-10-CM

## 2015-07-13 DIAGNOSIS — Z5111 Encounter for antineoplastic chemotherapy: Secondary | ICD-10-CM | POA: Diagnosis present

## 2015-07-13 DIAGNOSIS — N183 Chronic kidney disease, stage 3 (moderate): Secondary | ICD-10-CM | POA: Diagnosis not present

## 2015-07-13 DIAGNOSIS — C3491 Malignant neoplasm of unspecified part of right bronchus or lung: Secondary | ICD-10-CM

## 2015-07-13 DIAGNOSIS — Z902 Acquired absence of lung [part of]: Secondary | ICD-10-CM

## 2015-07-13 LAB — COMPREHENSIVE METABOLIC PANEL (CC13)
ALBUMIN: 3.8 g/dL (ref 3.5–5.0)
ALK PHOS: 89 U/L (ref 40–150)
ALT: 16 U/L (ref 0–55)
AST: 18 U/L (ref 5–34)
Anion Gap: 11 mEq/L (ref 3–11)
BILIRUBIN TOTAL: 0.37 mg/dL (ref 0.20–1.20)
BUN: 53.3 mg/dL — AB (ref 7.0–26.0)
CALCIUM: 9.2 mg/dL (ref 8.4–10.4)
CO2: 22 mEq/L (ref 22–29)
Chloride: 109 mEq/L (ref 98–109)
Creatinine: 2.6 mg/dL — ABNORMAL HIGH (ref 0.6–1.1)
EGFR: 20 mL/min/{1.73_m2} — ABNORMAL LOW (ref >90)
GLUCOSE: 88 mg/dL (ref 70–140)
Potassium: 4.3 mEq/L (ref 3.5–5.1)
SODIUM: 142 meq/L (ref 136–145)
TOTAL PROTEIN: 7.2 g/dL (ref 6.4–8.3)

## 2015-07-13 LAB — CBC WITH DIFFERENTIAL/PLATELET
BASO%: 0.4 % (ref 0.0–2.0)
Basophils Absolute: 0 10*3/uL (ref 0.0–0.1)
EOS%: 2.3 % (ref 0.0–7.0)
Eosinophils Absolute: 0.1 10*3/uL (ref 0.0–0.5)
HCT: 30.5 % — ABNORMAL LOW (ref 34.8–46.6)
HGB: 10.4 g/dL — ABNORMAL LOW (ref 11.6–15.9)
LYMPH%: 57.2 % — AB (ref 14.0–49.7)
MCH: 31 pg (ref 25.1–34.0)
MCHC: 34.1 g/dL (ref 31.5–36.0)
MCV: 91 fL (ref 79.5–101.0)
MONO#: 0.1 10*3/uL (ref 0.1–0.9)
MONO%: 2.5 % (ref 0.0–14.0)
NEUT%: 37.6 % — AB (ref 38.4–76.8)
NEUTROS ABS: 2.1 10*3/uL (ref 1.5–6.5)
Platelets: 238 10*3/uL (ref 145–400)
RBC: 3.35 10*6/uL — ABNORMAL LOW (ref 3.70–5.45)
RDW: 11.5 % (ref 11.2–14.5)
WBC: 5.6 10*3/uL (ref 3.9–10.3)
lymph#: 3.2 10*3/uL (ref 0.9–3.3)

## 2015-07-13 MED ORDER — SODIUM CHLORIDE 0.9 % IV SOLN
1600.0000 mg | Freq: Once | INTRAVENOUS | Status: AC
  Administered 2015-07-13: 1600 mg via INTRAVENOUS
  Filled 2015-07-13: qty 42.08

## 2015-07-13 MED ORDER — PROCHLORPERAZINE MALEATE 10 MG PO TABS
ORAL_TABLET | ORAL | Status: AC
  Filled 2015-07-13: qty 1

## 2015-07-13 MED ORDER — SODIUM CHLORIDE 0.9 % IV SOLN
Freq: Once | INTRAVENOUS | Status: AC
  Administered 2015-07-13: 16:00:00 via INTRAVENOUS

## 2015-07-13 MED ORDER — PROCHLORPERAZINE MALEATE 10 MG PO TABS
10.0000 mg | ORAL_TABLET | Freq: Once | ORAL | Status: AC
  Administered 2015-07-13: 10 mg via ORAL

## 2015-07-13 NOTE — Progress Notes (Addendum)
No images are attached to the encounter. No scans are attached to the encounter. No scans are attached to the encounter. [facility] NOTE  GATES,ROBERT NEVILL, MD [address] [place] [address]  DIAGNOSIS: Non-small cell carcinoma of right lung, stage 1   Staging form: Lung, AJCC 7th Edition     Clinical: Stage IB (T2a, N0, M0) - Unsigned  PRIOR THERAPY: Status post right upper thorascoscopic lobectomy on 04/29/2015 under the care of Dr. Roxan Hockey  CURRENT THERAPY: Adjuvant chemotherapy with carboplatin AUC 5 given on day 1 and gemcitabine 1000 mg/m2 given on days 1 and 8 every 3 weeks  INTERVAL HISTORY: Betty [name] 69 y.o. female returns for a scheduled regular follow up visit for followup of her recently diagnosed stage IB non small cell lung cancer. She is status post right upper lobectomy and presents to discuss her adjuvant treatment options.  She is currently beceiving adjuvant chemotherapy with carboplatin and gemcitabine. She is status post day 1 of cycle #1. She tolerate her chemotherapy relatively well except for some mild nausea that was well controlled with her current anti-emetic. She admits to not drinking adequate amounts of water daily. She voices no other specific complaints today.  MEDICAL HISTORY: Past Medical History  Diagnosis Date  . Hypertension   . Hypercholesterolemia   . Tobacco dependence   . GERD (gastroesophageal reflux disease)   . Sciatica of right side   . Seasonal allergic rhinitis   . Acute mastitis of right breast     10/2003  . Family history of breast cancer   . Colon polyps   . Tubular adenoma of colon   . Vitamin D deficiency   . COPD (chronic obstructive pulmonary disease)     emphysema  . Arthritis     bursitis in right shoulder  . Chronic kidney disease     CKD stage 3 04/28/15    ALLERGIES:  is allergic to sulfa antibiotics.  MEDICATIONS:  Current Outpatient Prescriptions   Medication Sig Dispense Refill  . acetaminophen (TYLENOL) 325 MG tablet Take 650 mg by mouth every 6 (six) hours as needed.    Marland Kitchen aspirin 81 MG tablet Take 81 mg by mouth daily.    . cholecalciferol (VITAMIN D) 1000 UNITS tablet Take 1,000 Units by mouth daily.    Marland Kitchen lisinopril-hydrochlorothiazide (PRINZIDE,ZESTORETIC) 20-12.5 MG per tablet Take 1 tablet by mouth daily.     Marland Kitchen loratadine (CLARITIN) 10 MG tablet Take 10 mg by mouth daily as needed.     . metoprolol succinate (TOPROL-XL) 50 MG 24 hr tablet Take 50 mg by mouth daily.     . Multiple Vitamins-Minerals (MULTIVITAMIN PO) Take 1 tablet by mouth daily.    . prochlorperazine (COMPAZINE) 10 MG tablet Take 1 tablet (10 mg total) by mouth every 6 (six) hours as needed for nausea or vomiting. 30 tablet 0  . simvastatin (ZOCOR) 20 MG tablet Take 20 mg by mouth 3 (three) times a week. Mon Wed Fri    . traMADol (ULTRAM) 50 MG tablet Take 1-2 tablets (50-100 mg total) by mouth 3 (three) times daily as needed. 30 tablet 0   No current facility-administered medications for this visit.    SURGICAL HISTORY:  Past Surgical History  Procedure Laterality Date  . Vaginal delivery      51 yrs ago  . Breast surgery      small mass removed from left breast--benign  . Video assisted thoracoscopy (  vats)/ lobectomy Right 04/29/2015    Procedure: RIGHT VIDEO ASSISTED THORACOSCOPY (VATS) WEDGE RESECTION/ RIGHT LOWER LOBECTOMY, CRYO-ANALGESIA OF INTERCOSTAL NERVES;  Surgeon: [name], MD;  Location: [facility];  Service: Thoracic;  Laterality: Right;  . Cryo intercostal nerve block Right 04/29/2015    Procedure: CRYO INTERCOSTAL NERVE BLOCK;  Surgeon: [name], MD;  Location: [facility];  Service: Thoracic;  Laterality: Right;  . Lobectomy Right 04/29/2015    Procedure: RIGHT LOWER LUNG LOBECTOMY ;  Surgeon: [name], MD;  Location: [facility];  Service: Thoracic;  Laterality: Right;  . Lymph node dissection Right 04/29/2015    Procedure:  RIGHT LUNG LYMPH NODE DISSECTION;  Surgeon: [name], MD;  Location: [facility];  Service: Thoracic;  Laterality: Right;    REVIEW OF SYSTEMS:  Review of Systems  Constitutional: Negative for fever, chills, weight loss, malaise/fatigue and diaphoresis.  HENT: Negative for congestion, ear discharge, ear pain, hearing loss, nosebleeds, sore throat and tinnitus.   Eyes: Negative for blurred vision, double vision, photophobia, pain, discharge and redness.  Respiratory: Negative for cough, hemoptysis, sputum production, shortness of breath, wheezing and stridor.   Cardiovascular: Negative for chest pain, palpitations, orthopnea, claudication, leg swelling and PND.  Gastrointestinal: Positive for nausea. Negative for heartburn, vomiting, abdominal pain, diarrhea, constipation, blood in stool and melena.  Genitourinary: Negative.   Musculoskeletal: Negative.   Skin: Negative.   Neurological: Negative for dizziness, tingling, focal weakness, seizures, weakness and headaches.  Endo/Heme/Allergies: Does not bruise/bleed easily.  Psychiatric/Behavioral: Negative for depression. The patient is not nervous/anxious and does not have insomnia.      PHYSICAL EXAMINATION: Physical Exam  Constitutional: She is oriented to person, place, and time and well-developed, well-nourished, and in no distress.  HENT:  Head: Normocephalic and atraumatic.  Mouth/Throat: Oropharynx is clear and moist.  Eyes: Pupils are equal, round, and reactive to light.  Neck: Normal range of motion. Neck supple. No JVD present. No tracheal deviation present. No thyromegaly present.  Cardiovascular: Normal rate, regular rhythm, normal heart sounds and intact distal pulses.  Exam reveals no gallop and no friction rub.   No murmur heard. Pulmonary/Chest: Effort normal and breath sounds normal. No respiratory distress. She has no wheezes. She has no rales.  Abdominal: Soft. Bowel sounds are normal. She exhibits no distension and  no mass. There is no tenderness.  Musculoskeletal: Normal range of motion. She exhibits no edema or tenderness.  Lymphadenopathy:    She has no cervical adenopathy.  Neurological: She is alert and oriented to person, place, and time. She has normal reflexes. Gait normal.  Skin: Skin is warm and dry. No rash noted.    ECOG PERFORMANCE STATUS: 0 - Asymptomatic  Blood pressure 119/53, pulse 96, temperature 98.1 F (36.7 C), temperature source Oral, resp. rate 18, height '5\' 4"'$  (1.626 m), weight 122 lb 4.8 oz (55.475 kg), SpO2 100 %.  LABORATORY DATA: Lab Results  Component Value Date   WBC 5.6 07/13/2015   HGB 10.4* 07/13/2015   HCT 30.5* 07/13/2015   MCV 91.0 07/13/2015   PLT 238 07/13/2015      Chemistry      Component Value Date/Time   NA 142 07/13/2015 1341   NA 138 05/02/2015 0600   K 4.3 07/13/2015 1341   K 4.0 05/02/2015 0600   CL 112* 05/02/2015 0600   CO2 22 07/13/2015 1341   CO2 22 05/02/2015 0600   BUN 53.3* 07/13/2015 1341   BUN 16 05/02/2015 0600  CREATININE 2.6* 07/13/2015 1341   CREATININE 1.76* 05/02/2015 0600      Component Value Date/Time   CALCIUM 9.2 07/13/2015 1341   CALCIUM 8.7* 05/02/2015 0600   ALKPHOS 89 07/13/2015 1341   ALKPHOS 56 05/01/2015 0558   AST 18 07/13/2015 1341   AST 31 05/01/2015 0558   ALT 16 07/13/2015 1341   ALT 14 05/01/2015 0558   BILITOT 0.37 07/13/2015 1341   BILITOT 0.4 05/01/2015 0558       RADIOGRAPHIC STUDIES:  No results found.   ASSESSMENT/PLAN:  No problem-specific assessment & plan notes found for this encounter. The patient is a pleasant 69 year old African American female recently diagnosed with stage IB non small cell lung cancer, status post right upper lobectomy. She presented to discuss her adjuvant chemotherapy options. Dr. Julien Nordmann initially had planned to use cisplatin and ALimta but the patient has an elevated baseline creatinine level. She is being treated with carboplatin for AUC 5 on day 1 and  gemcitabine at 1000 mg/m2 given on days 1 and 8 every 3 weeks.She tolerated day 1 of cycle #1 except for  Some mild nausea. The patient was discussed with and also seen by Dr. Julien Nordmann. Her creatinine is a bit more elevated at 2.6. She is advised to increase her daily fluid intake. She will proceed with day 8 of cycle #1. She will follow up in 2 weeks prior to the start of cycle #2.  Awilda Metro E, PA-C 07/13/2015  All questions were answered. The patient knows to call the clinic with any problems, questions or concerns. We can certainly see the patient much sooner if necessary.  ADDENDUM: Hematology/Oncology Attending: I had a face to face encounter with the patient. I recommended her care plan. This is a very pleasant 69 years old African-American female with stage Ib non-small cell lung cancer who is currently undergoing adjuvant systemic chemotherapy with carboplatin and gemcitabine status post day 1 of the first cycle. The patient tolerated the first week of her treatment fairly well with no significant adverse effects. She denied having any significant nausea or vomiting, no fever or chills. I recommended for her to proceed with day 8 of the first cycle as a scheduled today. The patient would come back for follow-up visit in 2 weeks for reevaluation before starting cycle #2. She was advised to call immediately if she has any concerning symptoms in the interval.  Disclaimer: This note was dictated with voice recognition software. Similar sounding words can inadvertently be transcribed and may not be corrected upon review. Eilleen Kempf., MD 07/17/2015

## 2015-07-13 NOTE — Patient Instructions (Signed)
[facility] Discharge Instructions for Patients Receiving Chemotherapy  Today you received the following chemotherapy agents: Gemzar.  To help prevent nausea and vomiting after your treatment, we encourage you to take your nausea medication: Compazine 10 mg every 6 hours as needed.   If you develop nausea and vomiting that is not controlled by your nausea medication, call the clinic.   BELOW ARE SYMPTOMS THAT SHOULD BE REPORTED IMMEDIATELY:  *FEVER GREATER THAN 100.5 F  *CHILLS WITH OR WITHOUT FEVER  NAUSEA AND VOMITING THAT IS NOT CONTROLLED WITH YOUR NAUSEA MEDICATION  *UNUSUAL SHORTNESS OF BREATH  *UNUSUAL BRUISING OR BLEEDING  TENDERNESS IN MOUTH AND THROAT WITH OR WITHOUT PRESENCE OF ULCERS  *URINARY PROBLEMS  *BOWEL PROBLEMS  UNUSUAL RASH Items with * indicate a potential emergency and should be followed up as soon as possible.  Feel free to call the clinic you have any questions or concerns. The clinic phone number is [phone].  Please show the [facility] at check-in to the Emergency Department and triage nurse.

## 2015-07-13 NOTE — Progress Notes (Signed)
Reviewed labs. Creratinine 2.6. Ok to treat per Dr. Julien Nordmann.

## 2015-07-15 NOTE — Patient Instructions (Signed)
Increase your daily fluid intake Follow up in 2 weeks, prior to your next cycle of chemotherapy

## 2015-07-18 ENCOUNTER — Other Ambulatory Visit: Payer: Self-pay | Admitting: Thoracic Surgery (Cardiothoracic Vascular Surgery)

## 2015-07-18 DIAGNOSIS — C3490 Malignant neoplasm of unspecified part of unspecified bronchus or lung: Secondary | ICD-10-CM

## 2015-07-19 ENCOUNTER — Encounter: Payer: Self-pay | Admitting: Thoracic Surgery (Cardiothoracic Vascular Surgery)

## 2015-07-19 ENCOUNTER — Ambulatory Visit (INDEPENDENT_AMBULATORY_CARE_PROVIDER_SITE_OTHER): Payer: Self-pay | Admitting: Thoracic Surgery (Cardiothoracic Vascular Surgery)

## 2015-07-19 ENCOUNTER — Ambulatory Visit
Admission: RE | Admit: 2015-07-19 | Discharge: 2015-07-19 | Disposition: A | Discharge status: Home / Self Care | Payer: Medicare Other | Source: Ambulatory Visit | Attending: Thoracic Surgery (Cardiothoracic Vascular Surgery) | Admitting: Thoracic Surgery (Cardiothoracic Vascular Surgery)

## 2015-07-19 VITALS — BP 104/83 | HR 83 | Resp 16 | Ht 64.0 in | Wt 122.0 lb

## 2015-07-19 DIAGNOSIS — Z9889 Other specified postprocedural states: Secondary | ICD-10-CM

## 2015-07-19 DIAGNOSIS — C3490 Malignant neoplasm of unspecified part of unspecified bronchus or lung: Secondary | ICD-10-CM

## 2015-07-19 DIAGNOSIS — C3411 Malignant neoplasm of upper lobe, right bronchus or lung: Secondary | ICD-10-CM | POA: Diagnosis not present

## 2015-07-19 DIAGNOSIS — C3491 Malignant neoplasm of unspecified part of right bronchus or lung: Secondary | ICD-10-CM

## 2015-07-19 DIAGNOSIS — Z902 Acquired absence of lung [part of]: Secondary | ICD-10-CM

## 2015-07-19 NOTE — Progress Notes (Signed)
[address]       [place] [address]             [phone]       HPI: [name] returns today for a scheduled 3 month postoperative follow-up visit  She is a 69 year old woman who had a nodule in her right upper lobe that was discovered on a low-dose screening CT scan. She underwent thoracoscopic right upper lobectomy on 04/29/2015. It turned out to be a stage IB (T2a, N0) non-small cell carcinoma. She did well postoperatively and was discharged home on day 4.  She was last seen in the office on July 12,2016. At that time she was recovering well. She was referred to Dr. Julien Nordmann for consultation.  She has started on chemotherapy with carboplatin and gemcitabine. She has had 2 cycles of that now. She says there are 8 cycles planned. She has tolerated that well with only some mild nausea. She is concerned about the possibility of hair loss.  She says she's not really having significant incisional pain, but that it is "aggravating." She is having some sinus congestion that she attributes to allergies. She has not had any shortness of breath, cough, or hemoptysis.  Past Medical History  Diagnosis Date  . Hypertension   . Hypercholesterolemia   . Tobacco dependence   . GERD (gastroesophageal reflux disease)   . Sciatica of right side   . Seasonal allergic rhinitis   . Acute mastitis of right breast     10/2003  . Family history of breast cancer   . Colon polyps   . Tubular adenoma of colon   . Vitamin D deficiency   . COPD (chronic obstructive pulmonary disease)     emphysema  . Arthritis     bursitis in right shoulder  . Chronic kidney disease     CKD stage 3 04/28/15      Current Outpatient Prescriptions  Medication Sig Dispense Refill  . acetaminophen (TYLENOL) 325 MG tablet Take 650 mg by mouth every 6 (six) hours as needed.    Marland Kitchen aspirin 81 MG tablet Take 81 mg by mouth daily.    . cholecalciferol (VITAMIN D) 1000 UNITS tablet Take 1,000 Units by  mouth daily.    Marland Kitchen lisinopril-hydrochlorothiazide (PRINZIDE,ZESTORETIC) 20-12.5 MG per tablet Take 1 tablet by mouth daily.     . metoprolol succinate (TOPROL-XL) 50 MG 24 hr tablet Take 50 mg by mouth daily.     . Multiple Vitamins-Minerals (MULTIVITAMIN PO) Take 1 tablet by mouth daily.    . prochlorperazine (COMPAZINE) 10 MG tablet Take 1 tablet (10 mg total) by mouth every 6 (six) hours as needed for nausea or vomiting. 30 tablet 0  . simvastatin (ZOCOR) 20 MG tablet Take 20 mg by mouth 3 (three) times a week. Mon Wed Fri    . traMADol (ULTRAM) 50 MG tablet Take 1-2 tablets (50-100 mg total) by mouth 3 (three) times daily as needed. 30 tablet 0  . loratadine (CLARITIN) 10 MG tablet Take 10 mg by mouth daily as needed.      No current facility-administered medications for this visit.    Physical Exam BP 104/83 mmHg  Pulse 83  Resp 16  Ht '5\' 4"'$  (1.626 m)  Wt 122 lb (55.339 kg)  BMI 20.40 kg/m50 69 year old woman in no acute distress Appears well No cervical or subclavicular adenopathy Incisions well healed Lungs diminished in right base, otherwise clear  Diagnostic Tests: I personally reviewed  her chest x-ray. Shows postoperative changes. No evidence of recurrence.  Impression: 69 year old woman who had a thoracoscopic right upper lobectomy for a stage IB non-small cell carcinoma in June of this year. She continues to do well from a surgical standpoint. She has started adjuvant chemotherapy and is tolerating that well after 2 cycles.  Dr. Julien Nordmann will be following her on a regular basis and ordering follow-up scans. I will defer to him on scheduling of CTs and other tests as needed. I would like to see her back in 9 months for one year follow-up visit.  Plan:  Return in 9 months with PA and lateral chest x-ray.  Of course I will be happy to see her anytime in the interim as needed.  [name], MD [facility] [phone]

## 2015-07-20 ENCOUNTER — Other Ambulatory Visit (HOSPITAL_BASED_OUTPATIENT_CLINIC_OR_DEPARTMENT_OTHER): Payer: Medicare Other

## 2015-07-20 DIAGNOSIS — C3491 Malignant neoplasm of unspecified part of right bronchus or lung: Secondary | ICD-10-CM | POA: Diagnosis present

## 2015-07-20 LAB — CBC WITH DIFFERENTIAL/PLATELET
BASO%: 0.3 % (ref 0.0–2.0)
BASOS ABS: 0 10*3/uL (ref 0.0–0.1)
EOS ABS: 0 10*3/uL (ref 0.0–0.5)
EOS%: 0.3 % (ref 0.0–7.0)
HEMATOCRIT: 27.6 % — AB (ref 34.8–46.6)
HEMOGLOBIN: 9.4 g/dL — AB (ref 11.6–15.9)
LYMPH#: 2.4 10*3/uL (ref 0.9–3.3)
LYMPH%: 59.9 % — ABNORMAL HIGH (ref 14.0–49.7)
MCH: 30.6 pg (ref 25.1–34.0)
MCHC: 34.1 g/dL (ref 31.5–36.0)
MCV: 89.9 fL (ref 79.5–101.0)
MONO#: 0.1 10*3/uL (ref 0.1–0.9)
MONO%: 3.3 % (ref 0.0–14.0)
NEUT#: 1.4 10*3/uL — ABNORMAL LOW (ref 1.5–6.5)
NEUT%: 36.2 % — AB (ref 38.4–76.8)
PLATELETS: 93 10*3/uL — AB (ref 145–400)
RBC: 3.07 10*6/uL — ABNORMAL LOW (ref 3.70–5.45)
RDW: 11.6 % (ref 11.2–14.5)
WBC: 3.9 10*3/uL (ref 3.9–10.3)
nRBC: 0 % (ref 0–0)

## 2015-07-20 LAB — COMPREHENSIVE METABOLIC PANEL (CC13)
ALT: 11 U/L (ref 0–55)
AST: 18 U/L (ref 5–34)
Albumin: 3.8 g/dL (ref 3.5–5.0)
Alkaline Phosphatase: 97 U/L (ref 40–150)
Anion Gap: 9 mEq/L (ref 3–11)
BUN: 58.4 mg/dL — ABNORMAL HIGH (ref 7.0–26.0)
CO2: 21 meq/L — AB (ref 22–29)
Calcium: 9.5 mg/dL (ref 8.4–10.4)
Chloride: 110 mEq/L — ABNORMAL HIGH (ref 98–109)
Creatinine: 2.6 mg/dL — ABNORMAL HIGH (ref 0.6–1.1)
EGFR: 20 mL/min/{1.73_m2} — AB (ref >90)
GLUCOSE: 100 mg/dL (ref 70–140)
POTASSIUM: 4.6 meq/L (ref 3.5–5.1)
SODIUM: 140 meq/L (ref 136–145)
TOTAL PROTEIN: 7 g/dL (ref 6.4–8.3)
Total Bilirubin: 0.21 mg/dL (ref 0.20–1.20)

## 2015-07-27 ENCOUNTER — Ambulatory Visit (HOSPITAL_BASED_OUTPATIENT_CLINIC_OR_DEPARTMENT_OTHER): Payer: Medicare Other

## 2015-07-27 ENCOUNTER — Other Ambulatory Visit (HOSPITAL_BASED_OUTPATIENT_CLINIC_OR_DEPARTMENT_OTHER): Payer: Medicare Other

## 2015-07-27 ENCOUNTER — Ambulatory Visit (HOSPITAL_BASED_OUTPATIENT_CLINIC_OR_DEPARTMENT_OTHER): Payer: Medicare Other | Admitting: Internal Medicine

## 2015-07-27 ENCOUNTER — Encounter: Payer: Self-pay | Admitting: Internal Medicine

## 2015-07-27 VITALS — BP 118/63 | HR 86 | Temp 97.7°F | Resp 18 | Ht 64.0 in | Wt 124.6 lb

## 2015-07-27 DIAGNOSIS — C3491 Malignant neoplasm of unspecified part of right bronchus or lung: Secondary | ICD-10-CM

## 2015-07-27 DIAGNOSIS — C782 Secondary malignant neoplasm of pleura: Secondary | ICD-10-CM | POA: Diagnosis not present

## 2015-07-27 DIAGNOSIS — Z5111 Encounter for antineoplastic chemotherapy: Secondary | ICD-10-CM

## 2015-07-27 DIAGNOSIS — C3431 Malignant neoplasm of lower lobe, right bronchus or lung: Secondary | ICD-10-CM

## 2015-07-27 LAB — CBC WITH DIFFERENTIAL/PLATELET
BASO%: 0.9 % (ref 0.0–2.0)
Basophils Absolute: 0.1 10*3/uL (ref 0.0–0.1)
EOS%: 1.2 % (ref 0.0–7.0)
Eosinophils Absolute: 0.1 10*3/uL (ref 0.0–0.5)
HEMATOCRIT: 29.3 % — AB (ref 34.8–46.6)
HEMOGLOBIN: 9.7 g/dL — AB (ref 11.6–15.9)
LYMPH#: 3 10*3/uL (ref 0.9–3.3)
LYMPH%: 43.7 % (ref 14.0–49.7)
MCH: 31.2 pg (ref 25.1–34.0)
MCHC: 33.1 g/dL (ref 31.5–36.0)
MCV: 94.2 fL (ref 79.5–101.0)
MONO#: 0.7 10*3/uL (ref 0.1–0.9)
MONO%: 10.3 % (ref 0.0–14.0)
NEUT#: 3.1 10*3/uL (ref 1.5–6.5)
NEUT%: 43.9 % (ref 38.4–76.8)
Platelets: 413 10*3/uL — ABNORMAL HIGH (ref 145–400)
RBC: 3.11 10*6/uL — ABNORMAL LOW (ref 3.70–5.45)
RDW: 12 % (ref 11.2–14.5)
WBC: 7 10*3/uL (ref 3.9–10.3)

## 2015-07-27 LAB — COMPREHENSIVE METABOLIC PANEL (CC13)
ALBUMIN: 3.8 g/dL (ref 3.5–5.0)
ALK PHOS: 97 U/L (ref 40–150)
ALT: 14 U/L (ref 0–55)
ANION GAP: 7 meq/L (ref 3–11)
AST: 20 U/L (ref 5–34)
BUN: 33.6 mg/dL — ABNORMAL HIGH (ref 7.0–26.0)
CALCIUM: 9.4 mg/dL (ref 8.4–10.4)
CO2: 24 mEq/L (ref 22–29)
Chloride: 108 mEq/L (ref 98–109)
Creatinine: 2.3 mg/dL — ABNORMAL HIGH (ref 0.6–1.1)
EGFR: 25 mL/min/{1.73_m2} — AB (ref >90)
Glucose: 93 mg/dl (ref 70–140)
Potassium: 4.8 mEq/L (ref 3.5–5.1)
Sodium: 139 mEq/L (ref 136–145)
TOTAL PROTEIN: 7 g/dL (ref 6.4–8.3)

## 2015-07-27 MED ORDER — SODIUM CHLORIDE 0.9 % IV SOLN
Freq: Once | INTRAVENOUS | Status: AC
  Administered 2015-07-27: 16:00:00 via INTRAVENOUS
  Filled 2015-07-27: qty 8

## 2015-07-27 MED ORDER — SODIUM CHLORIDE 0.9 % IV SOLN
1600.0000 mg | Freq: Once | INTRAVENOUS | Status: AC
  Administered 2015-07-27: 1600 mg via INTRAVENOUS
  Filled 2015-07-27: qty 42.08

## 2015-07-27 MED ORDER — SODIUM CHLORIDE 0.9 % IV SOLN
Freq: Once | INTRAVENOUS | Status: AC
  Administered 2015-07-27: 15:00:00 via INTRAVENOUS

## 2015-07-27 MED ORDER — SODIUM CHLORIDE 0.9 % IV SOLN
215.0000 mg | Freq: Once | INTRAVENOUS | Status: AC
  Administered 2015-07-27: 220 mg via INTRAVENOUS
  Filled 2015-07-27: qty 22

## 2015-07-27 NOTE — Progress Notes (Signed)
Creatinine 2.3. Dr. Julien Nordmann called, per MD okay to treat.

## 2015-07-27 NOTE — Progress Notes (Signed)
[facility] [phone]   [phone]  OFFICE PROGRESS NOTE  GATES,ROBERT NEVILL, MD [address] [place] [address]  DIAGNOSIS: Non-small cell carcinoma of right lung, stage 1  Staging form: Lung, AJCC 7th Edition  Clinical: Stage IB (T2a, N0, M0) - Unsigned  PRIOR THERAPY: Status post right upper thorascoscopic lobectomy on 04/29/2015 under the care of Dr. Roxan Hockey  CURRENT THERAPY: Adjuvant chemotherapy with carboplatin AUC 5 given on day 1 and gemcitabine 1000 mg/m2 given on days 1 and 8 every 3 weeks  INTERVAL HISTORY: Betty [name] 69 y.o. female returns to the clinic today for follow-up visit accompanied by 2 family members. The patient is feeling fine today with no specific complaints. She tolerated the last cycle of her treatment fairly well except for fatigue and few days of nausea. She denied having any significant weight loss or night sweats. She denied having any alopecia. No significant fever or chills. The patient denied having any significant chest pain, shortness of breath, cough or hemoptysis. She is here today to start cycle #2 of her treatment.  MEDICAL HISTORY: Past Medical History  Diagnosis Date  . Hypertension   . Hypercholesterolemia   . Tobacco dependence   . GERD (gastroesophageal reflux disease)   . Sciatica of right side   . Seasonal allergic rhinitis   . Acute mastitis of right breast     10/2003  . Family history of breast cancer   . Colon polyps   . Tubular adenoma of colon   . Vitamin D deficiency   . COPD (chronic obstructive pulmonary disease)     emphysema  . Arthritis     bursitis in right shoulder  . Chronic kidney disease     CKD stage 3 04/28/15    ALLERGIES:  is allergic to sulfa antibiotics.  MEDICATIONS:  Current Outpatient Prescriptions  Medication Sig Dispense Refill  . acetaminophen (TYLENOL) 325 MG tablet Take 650 mg by mouth every 6 (six) hours as needed.      Marland Kitchen aspirin 81 MG tablet Take 81 mg by mouth daily.    . cholecalciferol (VITAMIN D) 1000 UNITS tablet Take 1,000 Units by mouth daily.    Marland Kitchen lisinopril-hydrochlorothiazide (PRINZIDE,ZESTORETIC) 20-12.5 MG per tablet Take 1 tablet by mouth daily.     Marland Kitchen loratadine (CLARITIN) 10 MG tablet Take 10 mg by mouth daily as needed.     . metoprolol succinate (TOPROL-XL) 50 MG 24 hr tablet Take 50 mg by mouth daily.     . Multiple Vitamins-Minerals (MULTIVITAMIN PO) Take 1 tablet by mouth daily.    . prochlorperazine (COMPAZINE) 10 MG tablet Take 1 tablet (10 mg total) by mouth every 6 (six) hours as needed for nausea or vomiting. 30 tablet 0  . simvastatin (ZOCOR) 20 MG tablet Take 20 mg by mouth 3 (three) times a week. Mon Wed Fri    . traMADol (ULTRAM) 50 MG tablet Take 1-2 tablets (50-100 mg total) by mouth 3 (three) times daily as needed. 30 tablet 0   No current facility-administered medications for this visit.    SURGICAL HISTORY:  Past Surgical History  Procedure Laterality Date  . Vaginal delivery      51 yrs ago  . Breast surgery      small mass removed from left breast--benign  . Video assisted thoracoscopy (vats)/ lobectomy Right 04/29/2015    Procedure: RIGHT VIDEO ASSISTED THORACOSCOPY (VATS) WEDGE RESECTION/ RIGHT LOWER LOBECTOMY, CRYO-ANALGESIA OF  INTERCOSTAL NERVES;  Surgeon: [name], MD;  Location: [facility];  Service: Thoracic;  Laterality: Right;  . Cryo intercostal nerve block Right 04/29/2015    Procedure: CRYO INTERCOSTAL NERVE BLOCK;  Surgeon: [name], MD;  Location: [facility];  Service: Thoracic;  Laterality: Right;  . Lobectomy Right 04/29/2015    Procedure: RIGHT LOWER LUNG LOBECTOMY ;  Surgeon: [name], MD;  Location: [facility];  Service: Thoracic;  Laterality: Right;  . Lymph node dissection Right 04/29/2015    Procedure: RIGHT LUNG LYMPH NODE DISSECTION;  Surgeon: [name], MD;  Location: [facility];  Service: Thoracic;  Laterality: Right;     REVIEW OF SYSTEMS:  A comprehensive review of systems was negative except for: Constitutional: positive for fatigue Gastrointestinal: positive for nausea   PHYSICAL EXAMINATION: General appearance: alert, cooperative and no distress Head: Normocephalic, without obvious abnormality, atraumatic Neck: no adenopathy, no JVD, supple, symmetrical, trachea midline and thyroid not enlarged, symmetric, no tenderness/mass/nodules Lymph nodes: Cervical, supraclavicular, and axillary nodes normal. Resp: clear to auscultation bilaterally Back: symmetric, no curvature. ROM normal. No CVA tenderness. Cardio: regular rate and rhythm, S1, S2 normal, no murmur, click, rub or gallop GI: soft, non-tender; bowel sounds normal; no masses,  no organomegaly Extremities: extremities normal, atraumatic, no cyanosis or edema  ECOG PERFORMANCE STATUS: 1 - Symptomatic but completely ambulatory  There were no vitals taken for this visit.  LABORATORY DATA: Lab Results  Component Value Date   WBC 7.0 07/27/2015   HGB 9.7* 07/27/2015   HCT 29.3* 07/27/2015   MCV 94.2 07/27/2015   PLT 413* 07/27/2015      Chemistry      Component Value Date/Time   NA 140 07/20/2015 1327   NA 138 05/02/2015 0600   K 4.6 07/20/2015 1327   K 4.0 05/02/2015 0600   CL 112* 05/02/2015 0600   CO2 21* 07/20/2015 1327   CO2 22 05/02/2015 0600   BUN 58.4* 07/20/2015 1327   BUN 16 05/02/2015 0600   CREATININE 2.6* 07/20/2015 1327   CREATININE 1.76* 05/02/2015 0600      Component Value Date/Time   CALCIUM 9.5 07/20/2015 1327   CALCIUM 8.7* 05/02/2015 0600   ALKPHOS 97 07/20/2015 1327   ALKPHOS 56 05/01/2015 0558   AST 18 07/20/2015 1327   AST 31 05/01/2015 0558   ALT 11 07/20/2015 1327   ALT 14 05/01/2015 0558   BILITOT 0.21 07/20/2015 1327   BILITOT 0.4 05/01/2015 0558       RADIOGRAPHIC STUDIES: Dg Chest 2 View  07/19/2015   CLINICAL DATA:  Malignant neoplasm of right lung, right lower lobectomy, VA ET es.   EXAM: CHEST  2 VIEW  COMPARISON:  05/17/2015  FINDINGS: Improving aeration at the right lung base. Postoperative changes on the right. No confluent opacities or effusions. Heart is normal size. No acute bony abnormality.  IMPRESSION: Postoperative changes on the right.  No acute findings.   Electronically Signed   By: Rolm Baptise M.D.   On: 07/19/2015 11:16    ASSESSMENT AND PLAN: This is a very pleasant 69 years old African-American female with a stage IB non-small cell lung cancer currently undergoing adjuvant systemic chemotherapy with carboplatin and gemcitabine status post 1 cycle. She tolerated the first cycle of her treatment well with no significant adverse effect except for mild fatigue and occasional nausea. Her nausea improved with Compazine. I recommended for the patient to proceed with cycle #2 today as a scheduled. She will  come back for follow-up visit in 3 weeks for reevaluation before starting cycle #3. The patient was advised to call immediately if she has any concerning symptoms in the interval. The patient voices understanding of current disease status and treatment options and is in agreement with the current care plan.  All questions were answered. The patient knows to call the clinic with any problems, questions or concerns. We can certainly see the patient much sooner if necessary.  Disclaimer: This note was dictated with voice recognition software. Similar sounding words can inadvertently be transcribed and may not be corrected upon review.

## 2015-07-27 NOTE — Patient Instructions (Signed)
[facility] Discharge Instructions for Patients Receiving Chemotherapy  Today you received the following chemotherapy agents gemzar, carboplatin  To help prevent nausea and vomiting after your treatment, we encourage you to take your nausea medication   If you develop nausea and vomiting that is not controlled by your nausea medication, call the clinic.   BELOW ARE SYMPTOMS THAT SHOULD BE REPORTED IMMEDIATELY:  *FEVER GREATER THAN 100.5 F  *CHILLS WITH OR WITHOUT FEVER  NAUSEA AND VOMITING THAT IS NOT CONTROLLED WITH YOUR NAUSEA MEDICATION  *UNUSUAL SHORTNESS OF BREATH  *UNUSUAL BRUISING OR BLEEDING  TENDERNESS IN MOUTH AND THROAT WITH OR WITHOUT PRESENCE OF ULCERS  *URINARY PROBLEMS  *BOWEL PROBLEMS  UNUSUAL RASH Items with * indicate a potential emergency and should be followed up as soon as possible.  Feel free to call the clinic you have any questions or concerns. The clinic phone number is [phone].  Please show the [facility] at check-in to the Emergency Department and triage nurse.

## 2015-07-27 NOTE — Progress Notes (Signed)
Per Betty [name] it is okay to treat pt today with chemotherapy and today's lab results

## 2015-08-16 ENCOUNTER — Telehealth: Payer: Self-pay | Admitting: *Deleted

## 2015-08-17 ENCOUNTER — Telehealth: Payer: Self-pay | Admitting: Internal Medicine

## 2015-08-17 ENCOUNTER — Other Ambulatory Visit (HOSPITAL_BASED_OUTPATIENT_CLINIC_OR_DEPARTMENT_OTHER): Payer: Medicare Other

## 2015-08-17 ENCOUNTER — Ambulatory Visit (HOSPITAL_BASED_OUTPATIENT_CLINIC_OR_DEPARTMENT_OTHER): Payer: Medicare Other | Admitting: Nurse Practitioner

## 2015-08-17 ENCOUNTER — Ambulatory Visit (HOSPITAL_BASED_OUTPATIENT_CLINIC_OR_DEPARTMENT_OTHER): Payer: Medicare Other

## 2015-08-17 VITALS — BP 149/60 | HR 93 | Temp 98.0°F | Resp 19 | Ht 64.0 in | Wt 124.1 lb

## 2015-08-17 DIAGNOSIS — C3491 Malignant neoplasm of unspecified part of right bronchus or lung: Secondary | ICD-10-CM

## 2015-08-17 DIAGNOSIS — C3431 Malignant neoplasm of lower lobe, right bronchus or lung: Secondary | ICD-10-CM

## 2015-08-17 DIAGNOSIS — Z5111 Encounter for antineoplastic chemotherapy: Secondary | ICD-10-CM

## 2015-08-17 DIAGNOSIS — C782 Secondary malignant neoplasm of pleura: Secondary | ICD-10-CM | POA: Diagnosis not present

## 2015-08-17 DIAGNOSIS — R110 Nausea: Secondary | ICD-10-CM

## 2015-08-17 LAB — CBC WITH DIFFERENTIAL/PLATELET
BASO%: 0.3 % (ref 0.0–2.0)
Basophils Absolute: 0 10*3/uL (ref 0.0–0.1)
EOS%: 1.8 % (ref 0.0–7.0)
Eosinophils Absolute: 0.1 10*3/uL (ref 0.0–0.5)
HCT: 28 % — ABNORMAL LOW (ref 34.8–46.6)
HEMOGLOBIN: 9.5 g/dL — AB (ref 11.6–15.9)
LYMPH%: 40.6 % (ref 14.0–49.7)
MCH: 31.9 pg (ref 25.1–34.0)
MCHC: 33.9 g/dL (ref 31.5–36.0)
MCV: 94 fL (ref 79.5–101.0)
MONO#: 0.6 10*3/uL (ref 0.1–0.9)
MONO%: 8.7 % (ref 0.0–14.0)
NEUT%: 48.6 % (ref 38.4–76.8)
NEUTROS ABS: 3.5 10*3/uL (ref 1.5–6.5)
Platelets: 265 10*3/uL (ref 145–400)
RBC: 2.98 10*6/uL — AB (ref 3.70–5.45)
RDW: 14.9 % — AB (ref 11.2–14.5)
WBC: 7.3 10*3/uL (ref 3.9–10.3)
lymph#: 3 10*3/uL (ref 0.9–3.3)

## 2015-08-17 LAB — COMPREHENSIVE METABOLIC PANEL (CC13)
ALBUMIN: 3.8 g/dL (ref 3.5–5.0)
ALK PHOS: 93 U/L (ref 40–150)
ALT: 10 U/L (ref 0–55)
ANION GAP: 12 meq/L — AB (ref 3–11)
AST: 18 U/L (ref 5–34)
BUN: 34.2 mg/dL — ABNORMAL HIGH (ref 7.0–26.0)
CALCIUM: 10 mg/dL (ref 8.4–10.4)
CHLORIDE: 110 meq/L — AB (ref 98–109)
CO2: 23 mEq/L (ref 22–29)
CREATININE: 2.3 mg/dL — AB (ref 0.6–1.1)
EGFR: 24 mL/min/{1.73_m2} — ABNORMAL LOW (ref >90)
Glucose: 83 mg/dl (ref 70–140)
Potassium: 4.2 mEq/L (ref 3.5–5.1)
Sodium: 145 mEq/L (ref 136–145)
TOTAL PROTEIN: 7.1 g/dL (ref 6.4–8.3)

## 2015-08-17 MED ORDER — SODIUM CHLORIDE 0.9 % IV SOLN
Freq: Once | INTRAVENOUS | Status: AC
  Administered 2015-08-17: 16:00:00 via INTRAVENOUS

## 2015-08-17 MED ORDER — SODIUM CHLORIDE 0.9 % IV SOLN
Freq: Once | INTRAVENOUS | Status: AC
  Administered 2015-08-17: 16:00:00 via INTRAVENOUS
  Filled 2015-08-17: qty 8

## 2015-08-17 MED ORDER — SODIUM CHLORIDE 0.9 % IV SOLN
1600.0000 mg | Freq: Once | INTRAVENOUS | Status: AC
  Administered 2015-08-17: 1600 mg via INTRAVENOUS
  Filled 2015-08-17: qty 42.08

## 2015-08-17 MED ORDER — SODIUM CHLORIDE 0.9 % IV SOLN
226.5000 mg | Freq: Once | INTRAVENOUS | Status: AC
  Administered 2015-08-17: 230 mg via INTRAVENOUS
  Filled 2015-08-17: qty 23

## 2015-08-17 NOTE — Progress Notes (Signed)
Per Ned Card NP.  Patient will be treated today C3 D1 - ok to treat with creatinine of 2.3 (VO/Lisa Marcello Moores NP)

## 2015-08-17 NOTE — Telephone Encounter (Signed)
Added chemo

## 2015-08-17 NOTE — Telephone Encounter (Signed)
Per 11/12 pof cxd 11/23 tx but keep lab/fu that day. Gave patient avs report and appointments for October and November. [facility] will call patient re ct - patient aware.

## 2015-08-17 NOTE — Telephone Encounter (Signed)
Return call to pt unable to reach.

## 2015-08-17 NOTE — Patient Instructions (Signed)
[facility] Discharge Instructions for Patients Receiving Chemotherapy  Today you received the following chemotherapy agents: Gemzar and Carboplatin   To help prevent nausea and vomiting after your treatment, we encourage you to take your nausea medication as directed.  --Compazine: Take 1 tablet (10 mg total) by mouth every 6  hours as needed for nausea or vomiting.   If you develop nausea and vomiting that is not controlled by your nausea medication, call the clinic.   BELOW ARE SYMPTOMS THAT SHOULD BE REPORTED IMMEDIATELY:  *FEVER GREATER THAN 100.5 F  *CHILLS WITH OR WITHOUT FEVER  NAUSEA AND VOMITING THAT IS NOT CONTROLLED WITH YOUR NAUSEA MEDICATION  *UNUSUAL SHORTNESS OF BREATH  *UNUSUAL BRUISING OR BLEEDING  TENDERNESS IN MOUTH AND THROAT WITH OR WITHOUT PRESENCE OF ULCERS  *URINARY PROBLEMS  *BOWEL PROBLEMS  UNUSUAL RASH Items with * indicate a potential emergency and should be followed up as soon as possible.  Feel free to call the clinic you have any questions or concerns. The clinic phone number is [phone].  Please show the [place] at check-in to the Emergency Department and triage nurse.  Gemcitabine injection What is this medicine? GEMCITABINE (jem SIT a been) is a chemotherapy drug. This medicine is used to treat many types of cancer like breast cancer, lung cancer, pancreatic cancer, and ovarian cancer. This medicine may be used for other purposes; ask your health care provider or pharmacist if you have questions. COMMON BRAND NAME(S): Gemzar What should I tell my health care provider before I take this medicine? They need to know if you have any of these conditions: -blood disorders -infection -kidney disease -liver disease -recent or ongoing radiation therapy -an unusual or allergic reaction to gemcitabine, other chemotherapy, other medicines, foods, dyes, or preservatives -pregnant or trying to get  pregnant -breast-feeding How should I use this medicine? This drug is given as an infusion into a vein. It is administered in a hospital or clinic by a specially trained health care professional. Talk to your pediatrician regarding the use of this medicine in children. Special care may be needed. Overdosage: If you think you have taken too much of this medicine contact a poison control center or emergency room at once. NOTE: This medicine is only for you. Do not share this medicine with others. What if I miss a dose? It is important not to miss your dose. Call your doctor or health care professional if you are unable to keep an appointment. What may interact with this medicine? -medicines to increase blood counts like filgrastim, pegfilgrastim, sargramostim -some other chemotherapy drugs like cisplatin -vaccines Talk to your doctor or health care professional before taking any of these medicines: -acetaminophen -aspirin -ibuprofen -ketoprofen -naproxen This list may not describe all possible interactions. Give your health care provider a list of all the medicines, herbs, non-prescription drugs, or dietary supplements you use. Also tell them if you smoke, drink alcohol, or use illegal drugs. Some items may interact with your medicine. What should I watch for while using this medicine? Visit your doctor for checks on your progress. This drug may make you feel generally unwell. This is not uncommon, as chemotherapy can affect healthy cells as well as cancer cells. Report any side effects. Continue your course of treatment even though you feel ill unless your doctor tells you to stop. In some cases, you may be given additional medicines to help with side effects. Follow all directions for their use. Call your doctor  or health care professional for advice if you get a fever, chills or sore throat, or other symptoms of a cold or flu. Do not treat yourself. This drug decreases your body's ability to  fight infections. Try to avoid being around people who are sick. This medicine may increase your risk to bruise or bleed. Call your doctor or health care professional if you notice any unusual bleeding. Be careful brushing and flossing your teeth or using a toothpick because you may get an infection or bleed more easily. If you have any dental work done, tell your dentist you are receiving this medicine. Avoid taking products that contain aspirin, acetaminophen, ibuprofen, naproxen, or ketoprofen unless instructed by your doctor. These medicines may hide a fever. Women should inform their doctor if they wish to become pregnant or think they might be pregnant. There is a potential for serious side effects to an unborn child. Talk to your health care professional or pharmacist for more information. Do not breast-feed an infant while taking this medicine. What side effects may I notice from receiving this medicine? Side effects that you should report to your doctor or health care professional as soon as possible: -allergic reactions like skin rash, itching or hives, swelling of the face, lips, or tongue -low blood counts - this medicine may decrease the number of white blood cells, red blood cells and platelets. You may be at increased risk for infections and bleeding. -signs of infection - fever or chills, cough, sore throat, pain or difficulty passing urine -signs of decreased platelets or bleeding - bruising, pinpoint red spots on the skin, black, tarry stools, blood in the urine -signs of decreased red blood cells - unusually weak or tired, fainting spells, lightheadedness -breathing problems -chest pain -mouth sores -nausea and vomiting -pain, swelling, redness at site where injected -pain, tingling, numbness in the hands or feet -stomach pain -swelling of ankles, feet, hands -unusual bleeding Side effects that usually do not require medical attention (report to your doctor or health care  professional if they continue or are bothersome): -constipation -diarrhea -hair loss -loss of appetite -stomach upset This list may not describe all possible side effects. Call your doctor for medical advice about side effects. You may report side effects to FDA at 1-800-FDA-1088. Where should I keep my medicine? This drug is given in a hospital or clinic and will not be stored at home. NOTE: This sheet is a summary. It may not cover all possible information. If you have questions about this medicine, talk to your doctor, pharmacist, or health care provider.  2015, Elsevier/Gold Standard. (2008-03-02 18:45:54)    Carboplatin injection What is this medicine? CARBOPLATIN (KAR boe pla tin) is a chemotherapy drug. It targets fast dividing cells, like cancer cells, and causes these cells to die. This medicine is used to treat ovarian cancer and many other cancers. This medicine may be used for other purposes; ask your health care provider or pharmacist if you have questions. COMMON BRAND NAME(S): Paraplatin What should I tell my health care provider before I take this medicine? They need to know if you have any of these conditions: -blood disorders -hearing problems -kidney disease -recent or ongoing radiation therapy -an unusual or allergic reaction to carboplatin, cisplatin, other chemotherapy, other medicines, foods, dyes, or preservatives -pregnant or trying to get pregnant -breast-feeding How should I use this medicine? This drug is usually given as an infusion into a vein. It is administered in a hospital or clinic by a specially  trained health care professional. Talk to your pediatrician regarding the use of this medicine in children. Special care may be needed. Overdosage: If you think you have taken too much of this medicine contact a poison control center or emergency room at once. NOTE: This medicine is only for you. Do not share this medicine with others. What if I miss a  dose? It is important not to miss a dose. Call your doctor or health care professional if you are unable to keep an appointment. What may interact with this medicine? -medicines for seizures -medicines to increase blood counts like filgrastim, pegfilgrastim, sargramostim -some antibiotics like amikacin, gentamicin, neomycin, streptomycin, tobramycin -vaccines Talk to your doctor or health care professional before taking any of these medicines: -acetaminophen -aspirin -ibuprofen -ketoprofen -naproxen This list may not describe all possible interactions. Give your health care provider a list of all the medicines, herbs, non-prescription drugs, or dietary supplements you use. Also tell them if you smoke, drink alcohol, or use illegal drugs. Some items may interact with your medicine. What should I watch for while using this medicine? Your condition will be monitored carefully while you are receiving this medicine. You will need important blood work done while you are taking this medicine. This drug may make you feel generally unwell. This is not uncommon, as chemotherapy can affect healthy cells as well as cancer cells. Report any side effects. Continue your course of treatment even though you feel ill unless your doctor tells you to stop. In some cases, you may be given additional medicines to help with side effects. Follow all directions for their use. Call your doctor or health care professional for advice if you get a fever, chills or sore throat, or other symptoms of a cold or flu. Do not treat yourself. This drug decreases your body's ability to fight infections. Try to avoid being around people who are sick. This medicine may increase your risk to bruise or bleed. Call your doctor or health care professional if you notice any unusual bleeding. Be careful brushing and flossing your teeth or using a toothpick because you may get an infection or bleed more easily. If you have any dental work  done, tell your dentist you are receiving this medicine. Avoid taking products that contain aspirin, acetaminophen, ibuprofen, naproxen, or ketoprofen unless instructed by your doctor. These medicines may hide a fever. Do not become pregnant while taking this medicine. Women should inform their doctor if they wish to become pregnant or think they might be pregnant. There is a potential for serious side effects to an unborn child. Talk to your health care professional or pharmacist for more information. Do not breast-feed an infant while taking this medicine. What side effects may I notice from receiving this medicine? Side effects that you should report to your doctor or health care professional as soon as possible: -allergic reactions like skin rash, itching or hives, swelling of the face, lips, or tongue -signs of infection - fever or chills, cough, sore throat, pain or difficulty passing urine -signs of decreased platelets or bleeding - bruising, pinpoint red spots on the skin, black, tarry stools, nosebleeds -signs of decreased red blood cells - unusually weak or tired, fainting spells, lightheadedness -breathing problems -changes in hearing -changes in vision -chest pain -high blood pressure -low blood counts - This drug may decrease the number of white blood cells, red blood cells and platelets. You may be at increased risk for infections and bleeding. -nausea and vomiting -pain, swelling,  redness or irritation at the injection site -pain, tingling, numbness in the hands or feet -problems with balance, talking, walking -trouble passing urine or change in the amount of urine Side effects that usually do not require medical attention (report to your doctor or health care professional if they continue or are bothersome): -hair loss -loss of appetite -metallic taste in the mouth or changes in taste This list may not describe all possible side effects. Call your doctor for medical advice  about side effects. You may report side effects to FDA at 1-800-FDA-1088. Where should I keep my medicine? This drug is given in a hospital or clinic and will not be stored at home. NOTE: This sheet is a summary. It may not cover all possible information. If you have questions about this medicine, talk to your doctor, pharmacist, or health care provider.  2015, Elsevier/Gold Standard. (2008-01-27 14:38:05)

## 2015-08-17 NOTE — Progress Notes (Signed)
  [facility] OFFICE PROGRESS NOTE   DIAGNOSIS: Non-small cell carcinoma of right lung, stage 1  Staging form: Lung, AJCC 7th Edition  Clinical: Stage IB (T2a, N0, M0) - Unsigned  PRIOR THERAPY: Status post right upper thorascoscopic lobectomy on 04/29/2015 under the care of Dr. Roxan Hockey  CURRENT THERAPY: Adjuvant chemotherapy with carboplatin AUC 5 given on day 1 and gemcitabine 1000 mg/m2 given on days 1 and 8 every 3 weeks. Cycle 1 initiated 07/06/2015.  INTERVAL HISTORY:   Betty [name] returns as scheduled. She began cycle 2 carboplatin/gemcitabine on 07/27/2015. She did not receive the day 8 treatment. She tends to have mild nausea for a few days after each treatment. The nausea is relieved with Compazine. No vomiting. No mouth sores. No diarrhea. No rash. She denies shortness of breath. She has an occasional cough which she relates to allergies. She has intermittent discomfort at the surgical scar.  Objective:  Vital signs in last 24 hours:  Blood pressure 149/60, pulse 93, temperature 98 F (36.7 C), temperature source Oral, resp. rate 19, height '5\' 4"'$  (1.626 m), weight 124 lb 1.6 oz (56.291 kg), SpO2 100 %.    HEENT:  No thrush or ulcers. Resp:  Lungs clear bilaterally. Cardio:  Regular rate and rhythm. GI:  Abdomen soft and nontender. No hepatomegaly. Vascular:  No leg edema. Calves soft and nontender. Neuro:  Alert and oriented. Motor strength 5 over 5. Knee DTRs 2+, symmetric.  Skin:  No rash.    Lab Results:  Lab Results  Component Value Date   WBC 7.3 08/17/2015   HGB 9.5* 08/17/2015   HCT 28.0* 08/17/2015   MCV 94.0 08/17/2015   PLT 265 08/17/2015   NEUTROABS 3.5 08/17/2015    Imaging:  No results found.  Medications: I have reviewed the patient's current medications.  Assessment/Plan: 1.  Stage IB non-small cell lung cancer diagnosed June 2016. Adjuvant chemotherapy initiated with carboplatin/gemcitabine on a day one/day 8 schedule  beginning 07/06/2015.   Disposition: Betty [name] appears well. She has completed 2 cycles of carboplatin/gemcitabine. For unclear reasons, she did not receive cycle 2 day 8 gemcitabine. Plan to proceed with cycle 3 today as scheduled. She will return for the day 8 gemcitabine on 08/24/2015. She will return for a follow-up visit and the fourth and final cycle of adjuvant carboplatin/gemcitabine on 09/07/2015. She will contact the office in the interim with any problems.  Plan reviewed with Dr. Julien Nordmann.    Ned Card ANP/GNP-BC   08/17/2015  2:48 PM

## 2015-08-24 ENCOUNTER — Other Ambulatory Visit (HOSPITAL_BASED_OUTPATIENT_CLINIC_OR_DEPARTMENT_OTHER): Payer: Medicare Other

## 2015-08-24 ENCOUNTER — Ambulatory Visit (HOSPITAL_BASED_OUTPATIENT_CLINIC_OR_DEPARTMENT_OTHER): Payer: Medicare Other

## 2015-08-24 VITALS — BP 108/55 | HR 78 | Temp 97.0°F | Resp 18

## 2015-08-24 DIAGNOSIS — C3491 Malignant neoplasm of unspecified part of right bronchus or lung: Secondary | ICD-10-CM

## 2015-08-24 DIAGNOSIS — C3431 Malignant neoplasm of lower lobe, right bronchus or lung: Secondary | ICD-10-CM

## 2015-08-24 DIAGNOSIS — Z5111 Encounter for antineoplastic chemotherapy: Secondary | ICD-10-CM

## 2015-08-24 DIAGNOSIS — C782 Secondary malignant neoplasm of pleura: Secondary | ICD-10-CM | POA: Diagnosis not present

## 2015-08-24 LAB — CBC WITH DIFFERENTIAL/PLATELET
BASO%: 0.4 % (ref 0.0–2.0)
BASOS ABS: 0 10*3/uL (ref 0.0–0.1)
EOS ABS: 0 10*3/uL (ref 0.0–0.5)
EOS%: 0.8 % (ref 0.0–7.0)
HCT: 26.6 % — ABNORMAL LOW (ref 34.8–46.6)
HEMOGLOBIN: 9 g/dL — AB (ref 11.6–15.9)
LYMPH%: 60.5 % — AB (ref 14.0–49.7)
MCH: 31.6 pg (ref 25.1–34.0)
MCHC: 33.8 g/dL (ref 31.5–36.0)
MCV: 93.3 fL (ref 79.5–101.0)
MONO#: 0.1 10*3/uL (ref 0.1–0.9)
MONO%: 2.3 % (ref 0.0–14.0)
NEUT#: 1.7 10*3/uL (ref 1.5–6.5)
NEUT%: 36 % — AB (ref 38.4–76.8)
Platelets: 169 10*3/uL (ref 145–400)
RBC: 2.85 10*6/uL — ABNORMAL LOW (ref 3.70–5.45)
RDW: 14.2 % (ref 11.2–14.5)
WBC: 4.8 10*3/uL (ref 3.9–10.3)
lymph#: 2.9 10*3/uL (ref 0.9–3.3)

## 2015-08-24 LAB — COMPREHENSIVE METABOLIC PANEL (CC13)
ALT: 12 U/L (ref 0–55)
AST: 23 U/L (ref 5–34)
Albumin: 3.9 g/dL (ref 3.5–5.0)
Alkaline Phosphatase: 87 U/L (ref 40–150)
Anion Gap: 10 mEq/L (ref 3–11)
BUN: 46.4 mg/dL — AB (ref 7.0–26.0)
CHLORIDE: 107 meq/L (ref 98–109)
CO2: 22 meq/L (ref 22–29)
CREATININE: 2.4 mg/dL — AB (ref 0.6–1.1)
Calcium: 9.5 mg/dL (ref 8.4–10.4)
EGFR: 23 mL/min/{1.73_m2} — ABNORMAL LOW (ref >90)
GLUCOSE: 86 mg/dL (ref 70–140)
Potassium: 4.1 mEq/L (ref 3.5–5.1)
Sodium: 139 mEq/L (ref 136–145)
Total Bilirubin: 0.41 mg/dL (ref 0.20–1.20)
Total Protein: 7.1 g/dL (ref 6.4–8.3)

## 2015-08-24 MED ORDER — PROCHLORPERAZINE MALEATE 10 MG PO TABS
10.0000 mg | ORAL_TABLET | Freq: Once | ORAL | Status: AC
  Administered 2015-08-24: 10 mg via ORAL

## 2015-08-24 MED ORDER — SODIUM CHLORIDE 0.9 % IV SOLN
INTRAVENOUS | Status: DC
  Administered 2015-08-24: 15:00:00 via INTRAVENOUS

## 2015-08-24 MED ORDER — SODIUM CHLORIDE 0.9 % IV SOLN
1600.0000 mg | Freq: Once | INTRAVENOUS | Status: AC
  Administered 2015-08-24: 1600 mg via INTRAVENOUS
  Filled 2015-08-24: qty 42.08

## 2015-08-24 MED ORDER — SODIUM CHLORIDE 0.9 % IV SOLN
Freq: Once | INTRAVENOUS | Status: AC
  Administered 2015-08-24: 15:00:00 via INTRAVENOUS

## 2015-08-24 MED ORDER — PROCHLORPERAZINE MALEATE 10 MG PO TABS
ORAL_TABLET | ORAL | Status: AC
  Filled 2015-08-24: qty 1

## 2015-08-24 NOTE — Progress Notes (Signed)
OK to treat with Creat-2.4 per Dr. Julien Nordmann.  Pt to receive additional 1 liter of NS along with Gemzar today.

## 2015-08-24 NOTE — Patient Instructions (Signed)
[facility] Discharge Instructions for Patients Receiving Chemotherapy  Today you received the following chemotherapy agents:  Gemzar  To help prevent nausea and vomiting after your treatment, we encourage you to take your nausea medication as ordered per MD.   If you develop nausea and vomiting that is not controlled by your nausea medication, call the clinic.   BELOW ARE SYMPTOMS THAT SHOULD BE REPORTED IMMEDIATELY:  *FEVER GREATER THAN 100.5 F  *CHILLS WITH OR WITHOUT FEVER  NAUSEA AND VOMITING THAT IS NOT CONTROLLED WITH YOUR NAUSEA MEDICATION  *UNUSUAL SHORTNESS OF BREATH  *UNUSUAL BRUISING OR BLEEDING  TENDERNESS IN MOUTH AND THROAT WITH OR WITHOUT PRESENCE OF ULCERS  *URINARY PROBLEMS  *BOWEL PROBLEMS  UNUSUAL RASH Items with * indicate a potential emergency and should be followed up as soon as possible.  Feel free to call the clinic you have any questions or concerns. The clinic phone number is [phone].  Please show the CHEMO ALERT CARD at check-in to the Emergency Department and triage nurse.   

## 2015-09-06 ENCOUNTER — Other Ambulatory Visit: Payer: Self-pay | Admitting: Internal Medicine

## 2015-09-07 ENCOUNTER — Ambulatory Visit (HOSPITAL_BASED_OUTPATIENT_CLINIC_OR_DEPARTMENT_OTHER): Payer: Medicare Other | Admitting: Nurse Practitioner

## 2015-09-07 ENCOUNTER — Other Ambulatory Visit (HOSPITAL_BASED_OUTPATIENT_CLINIC_OR_DEPARTMENT_OTHER): Payer: Medicare Other

## 2015-09-07 ENCOUNTER — Ambulatory Visit (HOSPITAL_BASED_OUTPATIENT_CLINIC_OR_DEPARTMENT_OTHER): Payer: Medicare Other

## 2015-09-07 VITALS — BP 111/41 | HR 88 | Temp 98.5°F | Resp 18 | Ht 64.0 in | Wt 127.1 lb

## 2015-09-07 DIAGNOSIS — C3491 Malignant neoplasm of unspecified part of right bronchus or lung: Secondary | ICD-10-CM

## 2015-09-07 DIAGNOSIS — Z5111 Encounter for antineoplastic chemotherapy: Secondary | ICD-10-CM | POA: Diagnosis present

## 2015-09-07 DIAGNOSIS — D649 Anemia, unspecified: Secondary | ICD-10-CM | POA: Diagnosis not present

## 2015-09-07 LAB — COMPREHENSIVE METABOLIC PANEL (CC13)
ALT: 11 U/L (ref 0–55)
AST: 20 U/L (ref 5–34)
Albumin: 3.6 g/dL (ref 3.5–5.0)
Alkaline Phosphatase: 91 U/L (ref 40–150)
Anion Gap: 9 mEq/L (ref 3–11)
BUN: 27.5 mg/dL — AB (ref 7.0–26.0)
CALCIUM: 9.6 mg/dL (ref 8.4–10.4)
CHLORIDE: 108 meq/L (ref 98–109)
CO2: 24 meq/L (ref 22–29)
CREATININE: 2.4 mg/dL — AB (ref 0.6–1.1)
EGFR: 23 mL/min/{1.73_m2} — ABNORMAL LOW (ref >90)
GLUCOSE: 88 mg/dL (ref 70–140)
POTASSIUM: 4.3 meq/L (ref 3.5–5.1)
SODIUM: 142 meq/L (ref 136–145)
Total Bilirubin: <0.3 mg/dL (ref 0.20–1.20)
Total Protein: 6.9 g/dL (ref 6.4–8.3)

## 2015-09-07 LAB — CBC WITH DIFFERENTIAL/PLATELET
BASO%: 0.2 % (ref 0.0–2.0)
BASOS ABS: 0 10*3/uL (ref 0.0–0.1)
EOS%: 1.4 % (ref 0.0–7.0)
Eosinophils Absolute: 0.1 10*3/uL (ref 0.0–0.5)
HEMATOCRIT: 22.5 % — AB (ref 34.8–46.6)
HEMOGLOBIN: 7.5 g/dL — AB (ref 11.6–15.9)
LYMPH#: 2.2 10*3/uL (ref 0.9–3.3)
LYMPH%: 38 % (ref 14.0–49.7)
MCH: 32.1 pg (ref 25.1–34.0)
MCHC: 33.3 g/dL (ref 31.5–36.0)
MCV: 96.2 fL (ref 79.5–101.0)
MONO#: 0.7 10*3/uL (ref 0.1–0.9)
MONO%: 12.3 % (ref 0.0–14.0)
NEUT#: 2.8 10*3/uL (ref 1.5–6.5)
NEUT%: 48.1 % (ref 38.4–76.8)
Platelets: 624 10*3/uL — ABNORMAL HIGH (ref 145–400)
RBC: 2.34 10*6/uL — ABNORMAL LOW (ref 3.70–5.45)
RDW: 16.1 % — AB (ref 11.2–14.5)
WBC: 5.8 10*3/uL (ref 3.9–10.3)

## 2015-09-07 MED ORDER — SODIUM CHLORIDE 0.9 % IV SOLN
1600.0000 mg | Freq: Once | INTRAVENOUS | Status: AC
  Administered 2015-09-07: 1600 mg via INTRAVENOUS
  Filled 2015-09-07: qty 42.08

## 2015-09-07 MED ORDER — SODIUM CHLORIDE 0.9 % IV SOLN
222.5000 mg | Freq: Once | INTRAVENOUS | Status: AC
  Administered 2015-09-07: 220 mg via INTRAVENOUS
  Filled 2015-09-07: qty 22

## 2015-09-07 MED ORDER — SODIUM CHLORIDE 0.9 % IV SOLN
Freq: Once | INTRAVENOUS | Status: AC
  Administered 2015-09-07: 15:00:00 via INTRAVENOUS

## 2015-09-07 MED ORDER — SODIUM CHLORIDE 0.9 % IV SOLN
Freq: Once | INTRAVENOUS | Status: AC
  Administered 2015-09-07: 15:00:00 via INTRAVENOUS
  Filled 2015-09-07: qty 8

## 2015-09-07 NOTE — Progress Notes (Signed)
Okay to treat with today's labs per Ned Card, NP.

## 2015-09-07 NOTE — Progress Notes (Addendum)
  [facility] OFFICE PROGRESS NOTE  DIAGNOSIS: Non-small cell carcinoma of right lung, stage 1  Staging form: Lung, AJCC 7th Edition  Clinical: Stage IB (T2a, N0, M0) - Unsigned  PRIOR THERAPY: Status post right upper thorascoscopic lobectomy on 04/29/2015 under the care of Dr. Roxan Hockey  CURRENT THERAPY: Adjuvant chemotherapy with carboplatin AUC 5 given on day 1 and gemcitabine 1000 mg/m2 given on days 1 and 8 every 3 weeks. Cycle 1 initiated 07/06/2015.   INTERVAL HISTORY:   Ms. Enterline returns as scheduled. She completed cycle 3 carboplatin/gemcitabine beginning 08/17/2015. No significant nausea/vomiting. No mouth sores. No diarrhea. No fever following treatment. No rash. No shortness of breath. She has an occasional cough.  Objective:  Vital signs in last 24 hours:  Blood pressure 111/41, pulse 88, temperature 98.5 F (36.9 C), temperature source Oral, resp. rate 18, height '5\' 4"'$  (1.626 m), weight 127 lb 1.6 oz (57.652 kg), SpO2 100 %.    HEENT: No thrush or ulcers. Resp: Lungs clear bilaterally. Cardio: Regular rate and rhythm. GI: Abdomen soft and nontender. No hepatomegaly. Vascular: No leg edema. Calves soft and nontender. Neuro: Alert and oriented. Gait normal.  Skin: No rash.    Lab Results:  Lab Results  Component Value Date   WBC 5.8 09/07/2015   HGB 7.5* 09/07/2015   HCT 22.5* 09/07/2015   MCV 96.2 09/07/2015   PLT 624* 09/07/2015   NEUTROABS 2.8 09/07/2015    Imaging:  No results found.  Medications: I have reviewed the patient's current medications.  Assessment/Plan: 1. Stage IB non-small cell lung cancer diagnosed June 2016. Adjuvant chemotherapy initiated with carboplatin/gemcitabine on a day one/day 8 schedule beginning 07/06/2015. 2. Anemia, progressive. Most likely due chemotherapy, question if renal dysfunction contributing.   Disposition: Ms. Hartzog appears stable. She has completed 3 cycles of adjuvant  carboplatin/gemcitabine. Plan to proceed with the fourth and final cycle today as scheduled.   We discussed the progressive anemia. She is asymptomatic. We will repeat a CBC when she returns in one week. She understands to contact the office with signs/symptoms suggestive of progressive anemia.  She will return for a follow-up visit with Dr. Julien Nordmann 09/28/2015, chest CT a few days prior.    Ned Card ANP/GNP-BC   09/07/2015  2:50 PM

## 2015-09-07 NOTE — Patient Instructions (Signed)
[facility] Discharge Instructions for Patients Receiving Chemotherapy  Today you received the following chemotherapy agents Gemzar/Carboplatin.  To help prevent nausea and vomiting after your treatment, we encourage you to take your nausea medication as directed.   If you develop nausea and vomiting that is not controlled by your nausea medication, call the clinic.   BELOW ARE SYMPTOMS THAT SHOULD BE REPORTED IMMEDIATELY:  *FEVER GREATER THAN 100.5 F  *CHILLS WITH OR WITHOUT FEVER  NAUSEA AND VOMITING THAT IS NOT CONTROLLED WITH YOUR NAUSEA MEDICATION  *UNUSUAL SHORTNESS OF BREATH  *UNUSUAL BRUISING OR BLEEDING  TENDERNESS IN MOUTH AND THROAT WITH OR WITHOUT PRESENCE OF ULCERS  *URINARY PROBLEMS  *BOWEL PROBLEMS  UNUSUAL RASH Items with * indicate a potential emergency and should be followed up as soon as possible.  Feel free to call the clinic you have any questions or concerns. The clinic phone number is [phone].  Please show the [place] at check-in to the Emergency Department and triage nurse.

## 2015-09-08 NOTE — Progress Notes (Signed)
Quick Note:  Call patient with the result and arrange for 2 units of PRBCs transfusion. ______ 

## 2015-09-09 ENCOUNTER — Other Ambulatory Visit: Payer: Self-pay | Admitting: *Deleted

## 2015-09-09 ENCOUNTER — Telehealth: Payer: Self-pay | Admitting: *Deleted

## 2015-09-09 ENCOUNTER — Ambulatory Visit (HOSPITAL_COMMUNITY): Payer: Medicare Other

## 2015-09-09 ENCOUNTER — Other Ambulatory Visit: Payer: Medicare Other

## 2015-09-09 ENCOUNTER — Ambulatory Visit (HOSPITAL_COMMUNITY)
Admission: RE | Admit: 2015-09-09 | Discharge: 2015-09-09 | Disposition: A | Discharge status: Home / Self Care | Payer: Medicare Other | Source: Ambulatory Visit | Attending: Internal Medicine | Admitting: Internal Medicine

## 2015-09-09 DIAGNOSIS — D6489 Other specified anemias: Secondary | ICD-10-CM

## 2015-09-09 LAB — PREPARE RBC (CROSSMATCH)

## 2015-09-09 NOTE — Telephone Encounter (Signed)
Called pt regarding transfusion- pt statesshe is unable to come in today until later this afternoon. Call to Sickle cell, pt appt scheduled for 9am Monday 2Units RBC's, labs to be drawn this afternoon.  Per pt ; she can arrive at 2pm for labs and 9am Monday for 2 units transfusion at sickle cell unit. POF to scheduling HAR called in Blood orders completed

## 2015-09-10 LAB — ABO/RH: ABO/RH(D): A POS

## 2015-09-12 ENCOUNTER — Ambulatory Visit (HOSPITAL_COMMUNITY)
Admission: RE | Admit: 2015-09-12 | Discharge: 2015-09-12 | Disposition: A | Discharge status: Home / Self Care | Payer: Medicare Other | Source: Ambulatory Visit | Attending: Internal Medicine | Admitting: Internal Medicine

## 2015-09-12 VITALS — BP 102/44 | HR 79 | Temp 98.4°F | Resp 18

## 2015-09-12 DIAGNOSIS — D6489 Other specified anemias: Secondary | ICD-10-CM | POA: Diagnosis not present

## 2015-09-12 MED ORDER — SODIUM CHLORIDE 0.9 % IV SOLN
250.0000 mL | Freq: Once | INTRAVENOUS | Status: AC
  Administered 2015-09-12: 250 mL via INTRAVENOUS

## 2015-09-12 MED ORDER — SODIUM CHLORIDE 0.9 % IJ SOLN: 3.0000 mL | INTRAMUSCULAR | Status: DC | PRN

## 2015-09-12 MED ORDER — ACETAMINOPHEN 325 MG PO TABS
650.0000 mg | ORAL_TABLET | Freq: Once | ORAL | Status: AC
  Administered 2015-09-12: 650 mg via ORAL
  Filled 2015-09-12: qty 2

## 2015-09-12 MED ORDER — HEPARIN SOD (PORK) LOCK FLUSH 100 UNIT/ML IV SOLN: 500.0000 [IU] | Freq: Every day | INTRAVENOUS | Status: DC | PRN

## 2015-09-12 MED ORDER — SODIUM CHLORIDE 0.9 % IJ SOLN: 10.0000 mL | INTRAMUSCULAR | Status: DC | PRN

## 2015-09-12 MED ORDER — DIPHENHYDRAMINE HCL 25 MG PO CAPS
25.0000 mg | ORAL_CAPSULE | Freq: Once | ORAL | Status: AC
  Administered 2015-09-12: 25 mg via ORAL
  Filled 2015-09-12: qty 1

## 2015-09-12 MED ORDER — HEPARIN SOD (PORK) LOCK FLUSH 100 UNIT/ML IV SOLN: 250.0000 [IU] | INTRAVENOUS | Status: DC | PRN

## 2015-09-12 NOTE — Progress Notes (Signed)
Diagnosis Association: Anemia due to other cause (285.8)           MD: Dr. Julien Nordmann  Procedure: Pt received 2 units of PRBCs  Condition during procedure: Pt tolerated well  Condition post procedure: Pt alert, ambulatory and oriented.

## 2015-09-13 LAB — TYPE AND SCREEN
ABO/RH(D): A POS
Antibody Screen: NEGATIVE
UNIT DIVISION: 0
Unit division: 0

## 2015-09-14 ENCOUNTER — Ambulatory Visit (HOSPITAL_BASED_OUTPATIENT_CLINIC_OR_DEPARTMENT_OTHER): Payer: Medicare Other

## 2015-09-14 ENCOUNTER — Other Ambulatory Visit (HOSPITAL_BASED_OUTPATIENT_CLINIC_OR_DEPARTMENT_OTHER): Payer: Medicare Other

## 2015-09-14 VITALS — BP 129/68 | HR 88 | Temp 97.9°F | Resp 16

## 2015-09-14 DIAGNOSIS — C3491 Malignant neoplasm of unspecified part of right bronchus or lung: Secondary | ICD-10-CM | POA: Diagnosis present

## 2015-09-14 DIAGNOSIS — Z5111 Encounter for antineoplastic chemotherapy: Secondary | ICD-10-CM | POA: Diagnosis present

## 2015-09-14 LAB — CBC WITH DIFFERENTIAL/PLATELET
BASO%: 0.6 % (ref 0.0–2.0)
Basophils Absolute: 0 10*3/uL (ref 0.0–0.1)
EOS ABS: 0 10*3/uL (ref 0.0–0.5)
EOS%: 0.7 % (ref 0.0–7.0)
HCT: 34.6 % — ABNORMAL LOW (ref 34.8–46.6)
HEMOGLOBIN: 11.6 g/dL (ref 11.6–15.9)
LYMPH%: 45.7 % (ref 14.0–49.7)
MCH: 30.8 pg (ref 25.1–34.0)
MCHC: 33.6 g/dL (ref 31.5–36.0)
MCV: 91.8 fL (ref 79.5–101.0)
MONO#: 0.3 10*3/uL (ref 0.1–0.9)
MONO%: 5.1 % (ref 0.0–14.0)
NEUT%: 47.9 % (ref 38.4–76.8)
NEUTROS ABS: 2.5 10*3/uL (ref 1.5–6.5)
Platelets: 589 10*3/uL — ABNORMAL HIGH (ref 145–400)
RBC: 3.77 10*6/uL (ref 3.70–5.45)
RDW: 17.1 % — AB (ref 11.2–14.5)
WBC: 5.3 10*3/uL (ref 3.9–10.3)
lymph#: 2.4 10*3/uL (ref 0.9–3.3)

## 2015-09-14 LAB — COMPREHENSIVE METABOLIC PANEL (CC13)
ALBUMIN: 3.7 g/dL (ref 3.5–5.0)
ALK PHOS: 88 U/L (ref 40–150)
ALT: 16 U/L (ref 0–55)
AST: 30 U/L (ref 5–34)
Anion Gap: 12 mEq/L — ABNORMAL HIGH (ref 3–11)
BUN: 31.1 mg/dL — AB (ref 7.0–26.0)
CO2: 22 meq/L (ref 22–29)
Calcium: 9.8 mg/dL (ref 8.4–10.4)
Chloride: 105 mEq/L (ref 98–109)
Creatinine: 2.2 mg/dL — ABNORMAL HIGH (ref 0.6–1.1)
EGFR: 25 mL/min/{1.73_m2} — AB (ref >90)
GLUCOSE: 70 mg/dL (ref 70–140)
Potassium: 4.4 mEq/L (ref 3.5–5.1)
SODIUM: 140 meq/L (ref 136–145)
Total Protein: 7.2 g/dL (ref 6.4–8.3)

## 2015-09-14 LAB — HOLD TUBE, BLOOD BANK

## 2015-09-14 MED ORDER — PROCHLORPERAZINE MALEATE 10 MG PO TABS
ORAL_TABLET | ORAL | Status: AC
  Filled 2015-09-14: qty 1

## 2015-09-14 MED ORDER — PROCHLORPERAZINE MALEATE 10 MG PO TABS
10.0000 mg | ORAL_TABLET | Freq: Once | ORAL | Status: AC
  Administered 2015-09-14: 10 mg via ORAL

## 2015-09-14 MED ORDER — SODIUM CHLORIDE 0.9 % IV SOLN
1600.0000 mg | Freq: Once | INTRAVENOUS | Status: AC
  Administered 2015-09-14: 1600 mg via INTRAVENOUS
  Filled 2015-09-14: qty 42.08

## 2015-09-14 MED ORDER — SODIUM CHLORIDE 0.9 % IV SOLN
Freq: Once | INTRAVENOUS | Status: AC
  Administered 2015-09-14: 15:00:00 via INTRAVENOUS

## 2015-09-14 NOTE — Patient Instructions (Signed)
[facility] Discharge Instructions for Patients Receiving Chemotherapy  Today you received the following chemotherapy agents Gemzar  To help prevent nausea and vomiting after your treatment, we encourage you to take your nausea medication as directed.    If you develop nausea and vomiting that is not controlled by your nausea medication, call the clinic.   BELOW ARE SYMPTOMS THAT SHOULD BE REPORTED IMMEDIATELY:  *FEVER GREATER THAN 100.5 F  *CHILLS WITH OR WITHOUT FEVER  NAUSEA AND VOMITING THAT IS NOT CONTROLLED WITH YOUR NAUSEA MEDICATION  *UNUSUAL SHORTNESS OF BREATH  *UNUSUAL BRUISING OR BLEEDING  TENDERNESS IN MOUTH AND THROAT WITH OR WITHOUT PRESENCE OF ULCERS  *URINARY PROBLEMS  *BOWEL PROBLEMS  UNUSUAL RASH Items with * indicate a potential emergency and should be followed up as soon as possible.  Feel free to call the clinic you have any questions or concerns. The clinic phone number is [phone].  Please show the CHEMO ALERT CARD at check-in to the Emergency Department and triage nurse.   

## 2015-09-14 NOTE — Progress Notes (Signed)
Verbal consent given per Dr. Julien Nordmann to treat with Creat level of 2.2

## 2015-09-26 ENCOUNTER — Encounter (HOSPITAL_COMMUNITY): Payer: Self-pay

## 2015-09-26 ENCOUNTER — Ambulatory Visit (HOSPITAL_COMMUNITY)
Admission: RE | Admit: 2015-09-26 | Discharge: 2015-09-26 | Disposition: A | Discharge status: Home / Self Care | Payer: Medicare Other | Source: Ambulatory Visit | Attending: Nurse Practitioner | Admitting: Nurse Practitioner

## 2015-09-26 DIAGNOSIS — Z9221 Personal history of antineoplastic chemotherapy: Secondary | ICD-10-CM | POA: Diagnosis not present

## 2015-09-26 DIAGNOSIS — Z902 Acquired absence of lung [part of]: Secondary | ICD-10-CM | POA: Diagnosis not present

## 2015-09-26 DIAGNOSIS — C3491 Malignant neoplasm of unspecified part of right bronchus or lung: Secondary | ICD-10-CM | POA: Diagnosis not present

## 2015-09-26 DIAGNOSIS — C3490 Malignant neoplasm of unspecified part of unspecified bronchus or lung: Secondary | ICD-10-CM | POA: Diagnosis not present

## 2015-09-26 HISTORY — DX: Malignant (primary) neoplasm, unspecified: C80.1

## 2015-09-27 DIAGNOSIS — E559 Vitamin D deficiency, unspecified: Secondary | ICD-10-CM | POA: Diagnosis not present

## 2015-09-27 DIAGNOSIS — Z23 Encounter for immunization: Secondary | ICD-10-CM | POA: Diagnosis not present

## 2015-09-27 DIAGNOSIS — F17201 Nicotine dependence, unspecified, in remission: Secondary | ICD-10-CM | POA: Diagnosis not present

## 2015-09-27 DIAGNOSIS — E782 Mixed hyperlipidemia: Secondary | ICD-10-CM | POA: Diagnosis not present

## 2015-09-27 DIAGNOSIS — N183 Chronic kidney disease, stage 3 (moderate): Secondary | ICD-10-CM | POA: Diagnosis not present

## 2015-09-27 DIAGNOSIS — C3430 Malignant neoplasm of lower lobe, unspecified bronchus or lung: Secondary | ICD-10-CM | POA: Diagnosis not present

## 2015-09-28 ENCOUNTER — Telehealth: Payer: Self-pay | Admitting: Internal Medicine

## 2015-09-28 ENCOUNTER — Ambulatory Visit: Payer: Medicare Other

## 2015-09-28 ENCOUNTER — Encounter: Payer: Self-pay | Admitting: Internal Medicine

## 2015-09-28 ENCOUNTER — Other Ambulatory Visit (HOSPITAL_BASED_OUTPATIENT_CLINIC_OR_DEPARTMENT_OTHER): Payer: Medicare Other

## 2015-09-28 ENCOUNTER — Ambulatory Visit (HOSPITAL_BASED_OUTPATIENT_CLINIC_OR_DEPARTMENT_OTHER): Payer: Medicare Other | Admitting: Internal Medicine

## 2015-09-28 VITALS — BP 112/67 | HR 89 | Temp 98.0°F | Resp 18 | Ht 64.0 in | Wt 127.2 lb

## 2015-09-28 DIAGNOSIS — C3491 Malignant neoplasm of unspecified part of right bronchus or lung: Secondary | ICD-10-CM

## 2015-09-28 DIAGNOSIS — C3431 Malignant neoplasm of lower lobe, right bronchus or lung: Secondary | ICD-10-CM | POA: Diagnosis not present

## 2015-09-28 LAB — CBC WITH DIFFERENTIAL/PLATELET
BASO%: 0.4 % (ref 0.0–2.0)
BASOS ABS: 0 10*3/uL (ref 0.0–0.1)
EOS ABS: 0.1 10*3/uL (ref 0.0–0.5)
EOS%: 1.7 % (ref 0.0–7.0)
HEMATOCRIT: 30.5 % — AB (ref 34.8–46.6)
HEMOGLOBIN: 9.9 g/dL — AB (ref 11.6–15.9)
LYMPH#: 1.7 10*3/uL (ref 0.9–3.3)
LYMPH%: 22.6 % (ref 14.0–49.7)
MCH: 30.7 pg (ref 25.1–34.0)
MCHC: 32.5 g/dL (ref 31.5–36.0)
MCV: 94.5 fL (ref 79.5–101.0)
MONO#: 1.1 10*3/uL — ABNORMAL HIGH (ref 0.1–0.9)
MONO%: 14.2 % — ABNORMAL HIGH (ref 0.0–14.0)
NEUT%: 61.1 % (ref 38.4–76.8)
NEUTROS ABS: 4.7 10*3/uL (ref 1.5–6.5)
Platelets: 209 10*3/uL (ref 145–400)
RBC: 3.23 10*6/uL — ABNORMAL LOW (ref 3.70–5.45)
RDW: 17.9 % — AB (ref 11.2–14.5)
WBC: 7.6 10*3/uL (ref 3.9–10.3)

## 2015-09-28 LAB — COMPREHENSIVE METABOLIC PANEL (CC13)
ALBUMIN: 3.7 g/dL (ref 3.5–5.0)
ALK PHOS: 88 U/L (ref 40–150)
ALT: 15 U/L (ref 0–55)
AST: 27 U/L (ref 5–34)
Anion Gap: 11 mEq/L (ref 3–11)
BUN: 49.1 mg/dL — AB (ref 7.0–26.0)
CALCIUM: 9.5 mg/dL (ref 8.4–10.4)
CO2: 21 mEq/L — ABNORMAL LOW (ref 22–29)
Chloride: 109 mEq/L (ref 98–109)
Creatinine: 3.1 mg/dL (ref 0.6–1.1)
EGFR: 17 mL/min/{1.73_m2} — ABNORMAL LOW (ref >90)
Glucose: 78 mg/dl (ref 70–140)
POTASSIUM: 4.5 meq/L (ref 3.5–5.1)
Sodium: 141 mEq/L (ref 136–145)
TOTAL PROTEIN: 6.9 g/dL (ref 6.4–8.3)

## 2015-09-28 NOTE — Progress Notes (Signed)
[facility] [phone]   Fax:([phone]  OFFICE PROGRESS NOTE  GATES,ROBERT NEVILL, MD [address] [place] [address]  DIAGNOSIS: Non-small cell carcinoma of right lung, stage 1  Staging form: Lung, AJCC 7th Edition  Clinical: Stage IB (T2a, N0, M0) - Unsigned  PRIOR THERAPY:  1) Status post right upper thorascoscopic lobectomy on 04/29/2015 under the care of Dr. Roxan Hockey. 2) Adjuvant chemotherapy with carboplatin AUC 5 given on day 1 and gemcitabine 1000 mg/m2 given on days 1 and 8 every 3 weeks. Status post 4 cycles, last dose was given 09/07/2015.  CURRENT THERAPY: Observation.  INTERVAL HISTORY: Betty [name] 69 y.o. female returns to the clinic today for follow-up visit accompanied by son. The patient is feeling fine today with no specific complaints. She tolerated the course of adjuvant chemotherapy fairly well except for fatigue. She denied having any significant weight loss or night sweats. She denied having any alopecia. No significant fever or chills. The patient denied having any significant chest pain, shortness of breath, cough or hemoptysis. She had repeat CT scan of the chest performed recently and she is here for evaluation and discussion of her scan results.  MEDICAL HISTORY: Past Medical History  Diagnosis Date  . Hypertension   . Hypercholesterolemia   . Tobacco dependence   . GERD (gastroesophageal reflux disease)   . Sciatica of right side   . Seasonal allergic rhinitis   . Acute mastitis of right breast     10/2003  . Family history of breast cancer   . Colon polyps   . Tubular adenoma of colon   . Vitamin D deficiency   . COPD (chronic obstructive pulmonary disease) (HCC)     emphysema  . Arthritis     bursitis in right shoulder  . Chronic kidney disease     CKD stage 3 04/28/15  . NSCL ca dx'd 03/2015    chemo; rllobectomy    ALLERGIES:  is allergic to sulfa antibiotics.  MEDICATIONS:   Current Outpatient Prescriptions  Medication Sig Dispense Refill  . acetaminophen (TYLENOL) 325 MG tablet Take 650 mg by mouth every 6 (six) hours as needed.    Marland Kitchen aspirin 81 MG tablet Take 81 mg by mouth daily.    . cholecalciferol (VITAMIN D) 1000 UNITS tablet Take 1,000 Units by mouth daily.    Marland Kitchen lisinopril-hydrochlorothiazide (PRINZIDE,ZESTORETIC) 20-12.5 MG per tablet Take 1 tablet by mouth daily.     Marland Kitchen loratadine (CLARITIN) 10 MG tablet Take 10 mg by mouth daily as needed.     . metoprolol succinate (TOPROL-XL) 50 MG 24 hr tablet Take 50 mg by mouth daily.     . Multiple Vitamins-Minerals (MULTIVITAMIN PO) Take 1 tablet by mouth daily.    . prochlorperazine (COMPAZINE) 10 MG tablet Take 1 tablet (10 mg total) by mouth every 6 (six) hours as needed for nausea or vomiting. 30 tablet 0  . simvastatin (ZOCOR) 20 MG tablet Take 20 mg by mouth 3 (three) times a week. Mon Wed Fri    . traMADol (ULTRAM) 50 MG tablet Take 1-2 tablets (50-100 mg total) by mouth 3 (three) times daily as needed. 30 tablet 0   No current facility-administered medications for this visit.    SURGICAL HISTORY:  Past Surgical History  Procedure Laterality Date  . Vaginal delivery      51 yrs ago  . Breast surgery      small mass removed from  left breast--benign  . Video assisted thoracoscopy (vats)/ lobectomy Right 04/29/2015    Procedure: RIGHT VIDEO ASSISTED THORACOSCOPY (VATS) WEDGE RESECTION/ RIGHT LOWER LOBECTOMY, CRYO-ANALGESIA OF INTERCOSTAL NERVES;  Surgeon: [name], MD;  Location: [facility];  Service: Thoracic;  Laterality: Right;  . Cryo intercostal nerve block Right 04/29/2015    Procedure: CRYO INTERCOSTAL NERVE BLOCK;  Surgeon: [name], MD;  Location: [facility];  Service: Thoracic;  Laterality: Right;  . Lobectomy Right 04/29/2015    Procedure: RIGHT LOWER LUNG LOBECTOMY ;  Surgeon: [name], MD;  Location: [facility];  Service: Thoracic;  Laterality: Right;  . Lymph node  dissection Right 04/29/2015    Procedure: RIGHT LUNG LYMPH NODE DISSECTION;  Surgeon: [name], MD;  Location: [facility];  Service: Thoracic;  Laterality: Right;    REVIEW OF SYSTEMS:  Constitutional: negative Eyes: negative Ears, nose, mouth, throat, and face: negative Respiratory: negative Cardiovascular: negative Gastrointestinal: negative Genitourinary:negative Integument/breast: negative Hematologic/lymphatic: negative Musculoskeletal:negative Neurological: negative Behavioral/Psych: negative Endocrine: negative Allergic/Immunologic: negative   PHYSICAL EXAMINATION: General appearance: alert, cooperative and no distress Head: Normocephalic, without obvious abnormality, atraumatic Neck: no adenopathy, no JVD, supple, symmetrical, trachea midline and thyroid not enlarged, symmetric, no tenderness/mass/nodules Lymph nodes: Cervical, supraclavicular, and axillary nodes normal. Resp: clear to auscultation bilaterally Back: symmetric, no curvature. ROM normal. No CVA tenderness. Cardio: regular rate and rhythm, S1, S2 normal, no murmur, click, rub or gallop GI: soft, non-tender; bowel sounds normal; no masses,  no organomegaly Extremities: extremities normal, atraumatic, no cyanosis or edema Neurologic: Alert and oriented X 3, normal strength and tone. Normal symmetric reflexes. Normal coordination and gait  ECOG PERFORMANCE STATUS: 1 - Symptomatic but completely ambulatory  Blood pressure 112/67, pulse 89, temperature 98 F (36.7 C), temperature source Oral, resp. rate 18, height '5\' 4"'$  (1.626 m), weight 127 lb 3.2 oz (57.698 kg), SpO2 100 %.  LABORATORY DATA: Lab Results  Component Value Date   WBC 7.6 09/28/2015   HGB 9.9* 09/28/2015   HCT 30.5* 09/28/2015   MCV 94.5 09/28/2015   PLT 209 09/28/2015      Chemistry      Component Value Date/Time   NA 140 09/14/2015 1333   NA 138 05/02/2015 0600   K 4.4 09/14/2015 1333   K 4.0 05/02/2015 0600   CL 112*  05/02/2015 0600   CO2 22 09/14/2015 1333   CO2 22 05/02/2015 0600   BUN 31.1* 09/14/2015 1333   BUN 16 05/02/2015 0600   CREATININE 2.2* 09/14/2015 1333   CREATININE 1.76* 05/02/2015 0600      Component Value Date/Time   CALCIUM 9.8 09/14/2015 1333   CALCIUM 8.7* 05/02/2015 0600   ALKPHOS 88 09/14/2015 1333   ALKPHOS 56 05/01/2015 0558   AST 30 09/14/2015 1333   AST 31 05/01/2015 0558   ALT 16 09/14/2015 1333   ALT 14 05/01/2015 0558   BILITOT <0.30 09/14/2015 1333   BILITOT 0.4 05/01/2015 0558       RADIOGRAPHIC STUDIES: Ct Chest Wo Contrast  09/26/2015  CLINICAL DATA:  Non-small-cell lung cancer, status post chemotherapy 09/14/2015, status post right lower lobectomy, cough EXAM: CT CHEST WITHOUT CONTRAST TECHNIQUE: Multidetector CT imaging of the chest was performed following the standard protocol without IV contrast. COMPARISON:  PET-CT dated 04/14/2015 FINDINGS: Mediastinum/Nodes: The heart is normal in size. No pericardial effusion. Coronary atherosclerosis. Atherosclerotic calcifications of the aortic arch. No suspicious mediastinal or axillary lymphadenopathy. Visualized left thyroid is mildly nodular/ heterogeneous. Lungs/Pleura: Status post  right lower lobectomy. Faint patchy/ground-glass nodularity in the posterior right upper lobe (series 5/image 16) and the posterior left upper lobe along the fissure (series 5/ image 16). Although persistent, this is less conspicuous than on prior PET-CT, and may be infectious/inflammatory. Stable 3 mm nodule along the left fissure (series 5/ image 30). Underlying moderate centrilobular and paraseptal emphysematous changes, upper lobe predominant. Biapical pleural-parenchymal scarring. No pleural effusion or pneumothorax. Upper abdomen: Visualized upper abdomen is notable for layering gallstones, bilateral renal cysts measuring up to 3.0 cm on the left (series 2/ image 53), and vascular calcifications. Musculoskeletal: Degenerative changes of  the visualized thoracolumbar spine. IMPRESSION: Status post right lower lobectomy. No evidence of recurrent or metastatic disease in the chest. Faint patchy/ground-glass nodularity in the posterior upper lobes, less conspicuous than on prior PET-CT, possibly infectious/inflammatory. Electronically Signed   By: Julian Hy M.D.   On: 09/26/2015 08:56    ASSESSMENT AND PLAN: This is a very pleasant 69 years old African-American female with a stage IB non-small cell lung cancer status post right lower lobectomy followed by 4 cycles of adjuvant chemotherapy with carboplatin and gemcitabine and tolerated her treatment fairly well. The recent CT scan of the chest showed no evidence for disease recurrence. I discussed the scan results with the patient and her son. I recommended for her to continue on observation with repeat CT scan of the chest in 6 months. For the chemotherapy-induced anemia, we will continue to monitor the patient closely. She does not require any transfusion at this point. The patient was advised to call immediately if she has any concerning symptoms in the interval. The patient voices understanding of current disease status and treatment options and is in agreement with the current care plan.  All questions were answered. The patient knows to call the clinic with any problems, questions or concerns. We can certainly see the patient much sooner if necessary.  Disclaimer: This note was dictated with voice recognition software. Similar sounding words can inadvertently be transcribed and may not be corrected upon review.

## 2015-09-28 NOTE — Telephone Encounter (Signed)
Gave and printed appt sched and avs fo rpt for May 2017 °

## 2016-03-20 ENCOUNTER — Encounter (HOSPITAL_COMMUNITY): Payer: Self-pay

## 2016-03-20 ENCOUNTER — Other Ambulatory Visit (HOSPITAL_BASED_OUTPATIENT_CLINIC_OR_DEPARTMENT_OTHER): Payer: Medicare Other

## 2016-03-20 ENCOUNTER — Encounter: Payer: Self-pay | Admitting: *Deleted

## 2016-03-20 ENCOUNTER — Ambulatory Visit (HOSPITAL_COMMUNITY)
Admission: RE | Admit: 2016-03-20 | Discharge: 2016-03-20 | Disposition: A | Discharge status: Home / Self Care | Payer: Medicare Other | Source: Ambulatory Visit | Attending: Internal Medicine | Admitting: Internal Medicine

## 2016-03-20 DIAGNOSIS — C3491 Malignant neoplasm of unspecified part of right bronchus or lung: Secondary | ICD-10-CM | POA: Diagnosis not present

## 2016-03-20 DIAGNOSIS — J439 Emphysema, unspecified: Secondary | ICD-10-CM | POA: Insufficient documentation

## 2016-03-20 DIAGNOSIS — R918 Other nonspecific abnormal finding of lung field: Secondary | ICD-10-CM | POA: Diagnosis not present

## 2016-03-20 DIAGNOSIS — K8020 Calculus of gallbladder without cholecystitis without obstruction: Secondary | ICD-10-CM | POA: Diagnosis not present

## 2016-03-20 DIAGNOSIS — K449 Diaphragmatic hernia without obstruction or gangrene: Secondary | ICD-10-CM | POA: Diagnosis not present

## 2016-03-20 LAB — COMPREHENSIVE METABOLIC PANEL
ALBUMIN: 3.9 g/dL (ref 3.5–5.0)
ALK PHOS: 101 U/L (ref 40–150)
ALT: 16 U/L (ref 0–55)
ANION GAP: 8 meq/L (ref 3–11)
AST: 21 U/L (ref 5–34)
BUN: 42.1 mg/dL — AB (ref 7.0–26.0)
CALCIUM: 9.9 mg/dL (ref 8.4–10.4)
CO2: 25 mEq/L (ref 22–29)
CREATININE: 2.5 mg/dL — AB (ref 0.6–1.1)
Chloride: 108 mEq/L (ref 98–109)
EGFR: 21 mL/min/{1.73_m2} — ABNORMAL LOW (ref >90)
Glucose: 90 mg/dl (ref 70–140)
Potassium: 5.1 mEq/L (ref 3.5–5.1)
Sodium: 142 mEq/L (ref 136–145)
TOTAL PROTEIN: 7.5 g/dL (ref 6.4–8.3)

## 2016-03-20 LAB — CBC WITH DIFFERENTIAL/PLATELET
BASO%: 0.3 % (ref 0.0–2.0)
BASOS ABS: 0 10*3/uL (ref 0.0–0.1)
EOS ABS: 0.1 10*3/uL (ref 0.0–0.5)
EOS%: 1.5 % (ref 0.0–7.0)
HEMATOCRIT: 32.2 % — AB (ref 34.8–46.6)
HEMOGLOBIN: 10.7 g/dL — AB (ref 11.6–15.9)
LYMPH%: 39.3 % (ref 14.0–49.7)
MCH: 30.2 pg (ref 25.1–34.0)
MCHC: 33.2 g/dL (ref 31.5–36.0)
MCV: 91 fL (ref 79.5–101.0)
MONO#: 0.4 10*3/uL (ref 0.1–0.9)
MONO%: 4.5 % (ref 0.0–14.0)
NEUT#: 4.7 10*3/uL (ref 1.5–6.5)
NEUT%: 54.4 % (ref 38.4–76.8)
Platelets: 287 10*3/uL (ref 145–400)
RBC: 3.54 10*6/uL — ABNORMAL LOW (ref 3.70–5.45)
RDW: 12.4 % (ref 11.2–14.5)
WBC: 8.7 10*3/uL (ref 3.9–10.3)
lymph#: 3.4 10*3/uL — ABNORMAL HIGH (ref 0.9–3.3)

## 2016-03-20 NOTE — Progress Notes (Signed)
Oncology Nurse Navigator Documentation  Oncology Nurse Navigator Flowsheets 03/20/2016  Navigator Encounter Type Other  Patient Visit Type Other  Treatment Phase Other  Barriers/Navigation Needs Coordination of Care  Interventions Coordination of Care  Acuity Level 1  Time Spent with Patient 15   Per Triage, patient's creatine is elevated and needs order for CT Chest without contrast.  I notified Dr. Julien Nordmann orders received.

## 2016-03-27 ENCOUNTER — Encounter: Payer: Self-pay | Admitting: *Deleted

## 2016-03-27 ENCOUNTER — Ambulatory Visit (HOSPITAL_BASED_OUTPATIENT_CLINIC_OR_DEPARTMENT_OTHER): Payer: Medicare Other | Admitting: Internal Medicine

## 2016-03-27 ENCOUNTER — Encounter: Payer: Self-pay | Admitting: Internal Medicine

## 2016-03-27 VITALS — BP 142/57 | HR 91 | Temp 97.9°F | Resp 18 | Ht 64.0 in | Wt 134.3 lb

## 2016-03-27 DIAGNOSIS — D638 Anemia in other chronic diseases classified elsewhere: Secondary | ICD-10-CM | POA: Diagnosis not present

## 2016-03-27 DIAGNOSIS — C3491 Malignant neoplasm of unspecified part of right bronchus or lung: Secondary | ICD-10-CM

## 2016-03-27 DIAGNOSIS — C3431 Malignant neoplasm of lower lobe, right bronchus or lung: Secondary | ICD-10-CM | POA: Diagnosis not present

## 2016-03-27 NOTE — Progress Notes (Signed)
Oncology Nurse Navigator Documentation  Oncology Nurse Navigator Flowsheets 03/27/2016  Navigator Encounter Type Clinic/MDC  Patient Visit Type MedOnc  Treatment Phase Other  Barriers/Navigation Needs No barriers at this time  Interventions None required  Acuity Level 1  Acuity Level 1 Minimal follow up required  Time Spent with Patient 51   Spoke with patient today at [facility].  She is currently on observation and will continue.  No barriers identified at this time.

## 2016-03-27 NOTE — Progress Notes (Signed)
[place] Telephone:(336) [phone]   Fax:(336) [phone]  OFFICE PROGRESS NOTE  GATES,ROBERT NEVILL, MD [address] [place] [address]  DIAGNOSIS: Non-small cell carcinoma of right lung, stage 1  Staging form: Lung, AJCC 7th Edition  Clinical: Stage IB (T2a, N0, M0) - Unsigned  PRIOR THERAPY:  1) Status post right upper thorascoscopic lobectomy on 04/29/2015 under the care of Dr. Roxan Hockey. 2) Adjuvant chemotherapy with carboplatin AUC 5 given on day 1 and gemcitabine 1000 mg/m2 given on days 1 and 8 every 3 weeks. Status post 4 cycles, last dose was given 09/07/2015.  CURRENT THERAPY: Observation.  INTERVAL HISTORY: Betty [name] 70 y.o. female returns to the clinic today for follow-up visit accompanied by son. The patient is feeling fine today with no specific complaints. She has been on observation for the last 6 months and the patient is feeling fine with no complaints today. She gained few pounds since her last visit. She denied having any fever or chills. The patient denied having any significant chest pain, shortness of breath, cough or hemoptysis. She had repeat CT scan of the chest performed recently and she is here for evaluation and discussion of her scan results.  MEDICAL HISTORY: Past Medical History  Diagnosis Date  . Hypertension   . Hypercholesterolemia   . Tobacco dependence   . GERD (gastroesophageal reflux disease)   . Sciatica of right side   . Seasonal allergic rhinitis   . Acute mastitis of right breast     10/2003  . Family history of breast cancer   . Colon polyps   . Tubular adenoma of colon   . Vitamin D deficiency   . COPD (chronic obstructive pulmonary disease) (HCC)     emphysema  . Arthritis     bursitis in right shoulder  . Chronic kidney disease     CKD stage 3 04/28/15  . NSCL ca dx'd 03/2015    chemo; rllobectomy    ALLERGIES:  is allergic to sulfa antibiotics.  MEDICATIONS:  Current  Outpatient Prescriptions  Medication Sig Dispense Refill  . aspirin 81 MG tablet Take 81 mg by mouth daily.    . cholecalciferol (VITAMIN D) 1000 UNITS tablet Take 1,000 Units by mouth daily.    Marland Kitchen lisinopril-hydrochlorothiazide (PRINZIDE,ZESTORETIC) 20-12.5 MG per tablet Take 1 tablet by mouth daily.     Marland Kitchen loratadine (CLARITIN) 10 MG tablet Take 10 mg by mouth daily as needed.     . metoprolol succinate (TOPROL-XL) 50 MG 24 hr tablet Take 50 mg by mouth daily.     . Multiple Vitamins-Minerals (MULTIVITAMIN PO) Take 1 tablet by mouth daily.    . simvastatin (ZOCOR) 20 MG tablet Take 20 mg by mouth 3 (three) times a week. Mon Wed Fri    . acetaminophen (TYLENOL) 325 MG tablet Take 650 mg by mouth every 6 (six) hours as needed. Reported on 03/27/2016    . traMADol (ULTRAM) 50 MG tablet Take 1-2 tablets (50-100 mg total) by mouth 3 (three) times daily as needed. (Patient not taking: Reported on 03/27/2016) 30 tablet 0   No current facility-administered medications for this visit.    SURGICAL HISTORY:  Past Surgical History  Procedure Laterality Date  . Vaginal delivery      51 yrs ago  . Breast surgery      small mass removed from left breast--benign  . Video assisted thoracoscopy (vats)/ lobectomy Right 04/29/2015    Procedure: RIGHT  VIDEO ASSISTED THORACOSCOPY (VATS) WEDGE RESECTION/ RIGHT LOWER LOBECTOMY, CRYO-ANALGESIA OF INTERCOSTAL NERVES;  Surgeon: [name], MD;  Location: [facility];  Service: Thoracic;  Laterality: Right;  . Cryo intercostal nerve block Right 04/29/2015    Procedure: CRYO INTERCOSTAL NERVE BLOCK;  Surgeon: [name], MD;  Location: [place];  Service: Thoracic;  Laterality: Right;  . Lobectomy Right 04/29/2015    Procedure: RIGHT LOWER LUNG LOBECTOMY ;  Surgeon: [name], MD;  Location: [place];  Service: Thoracic;  Laterality: Right;  . Lymph node dissection Right 04/29/2015    Procedure: RIGHT LUNG LYMPH NODE DISSECTION;  Surgeon: [name], MD;  Location: [facility];  Service: Thoracic;  Laterality: Right;    REVIEW OF SYSTEMS:  A comprehensive review of systems was negative.   PHYSICAL EXAMINATION: General appearance: alert, cooperative and no distress Head: Normocephalic, without obvious abnormality, atraumatic Neck: no adenopathy, no JVD, supple, symmetrical, trachea midline and thyroid not enlarged, symmetric, no tenderness/mass/nodules Lymph nodes: Cervical, supraclavicular, and axillary nodes normal. Resp: clear to auscultation bilaterally Back: symmetric, no curvature. ROM normal. No CVA tenderness. Cardio: regular rate and rhythm, S1, S2 normal, no murmur, click, rub or gallop GI: soft, non-tender; bowel sounds normal; no masses,  no organomegaly Extremities: extremities normal, atraumatic, no cyanosis or edema Neurologic: Alert and oriented X 3, normal strength and tone. Normal symmetric reflexes. Normal coordination and gait  ECOG PERFORMANCE STATUS: 1 - Symptomatic but completely ambulatory  Blood pressure 142/57, pulse 91, temperature 97.9 F (36.6 C), temperature source Oral, resp. rate 18, height '5\' 4"'$  (1.626 m), weight 134 lb 4.8 oz (60.918 kg), SpO2 100 %.  LABORATORY DATA: Lab Results  Component Value Date   WBC 8.7 03/20/2016   HGB 10.7* 03/20/2016   HCT 32.2* 03/20/2016   MCV 91.0 03/20/2016   PLT 287 03/20/2016      Chemistry      Component Value Date/Time   NA 142 03/20/2016 1058   NA 138 05/02/2015 0600   K 5.1 03/20/2016 1058   K 4.0 05/02/2015 0600   CL 112* 05/02/2015 0600   CO2 25 03/20/2016 1058   CO2 22 05/02/2015 0600   BUN 42.1* 03/20/2016 1058   BUN 16 05/02/2015 0600   CREATININE 2.5* 03/20/2016 1058   CREATININE 1.76* 05/02/2015 0600      Component Value Date/Time   CALCIUM 9.9 03/20/2016 1058   CALCIUM 8.7* 05/02/2015 0600   ALKPHOS 101 03/20/2016 1058   ALKPHOS 56 05/01/2015 0558   AST 21 03/20/2016 1058   AST 31 05/01/2015 0558   ALT 16 03/20/2016 1058    ALT 14 05/01/2015 0558   BILITOT <0.30 03/20/2016 1058   BILITOT 0.4 05/01/2015 0558       RADIOGRAPHIC STUDIES: Ct Chest Wo Contrast  03/20/2016  CLINICAL DATA:  Followup stage I non-small cell lung carcinoma of right lung. Previous right lower lobectomy and chemotherapy. EXAM: CT CHEST WITHOUT CONTRAST TECHNIQUE: Multidetector CT imaging of the chest was performed following the standard protocol without IV contrast. COMPARISON:  09/26/2015 FINDINGS: Mediastinum/Lymph Nodes: No masses or pathologically enlarged lymph nodes identified on this un-enhanced exam. Moderate size hiatal hernia again seen. Three-vessel coronary artery calcification again noted as well as calcified plaque and evolving the thoracic aorta. Lungs/Pleura: Postop changes again seen from previous right lower lobectomy. Mild to moderate centrilobular emphysema again demonstrated. Scattered perifissural sub-cm nodules in both lungs remain stable and are most consistent with benign intrapulmonary lymph nodes. Focal ill-defined  ground-glass opacities in both posterior upper lobes on image 33 of series 5 are also stable and likely benign. No evidence of pleural effusion. Upper abdomen: Both adrenal glands remain normal in appearance. Tiny calcified gallstones again noted as well as a tiny nonobstructive calculus in the upper pole of the right kidney as well as renal cysts. Musculoskeletal: No chest wall mass or suspicious bone lesions identified. IMPRESSION: Stable exam. No definite evidence of recurrent or metastatic carcinoma within the thorax. Stable bilateral upper lobe ground-glass opacities and probable benign intrapulmonary lymph nodes. Recommend continued attention on follow-up imaging. Stable emphysema, moderate size hiatal hernia and small calcified gallstones. Electronically Signed   By: Earle Gell M.D.   On: 03/20/2016 17:25    ASSESSMENT AND PLAN: This is a very pleasant 70 years old African-American female with a stage IB  non-small cell lung cancer status post right lower lobectomy followed by 4 cycles of adjuvant chemotherapy with carboplatin and gemcitabine and tolerated her treatment fairly well. The recent CT scan of the chest showed no evidence for disease recurrence. I discussed the scan results with the patient and her son. I recommended for her to continue on observation with repeat CT scan of the chest in 6 months. For the anemia of chronic disease, we will continue to monitor the patient closely.  For the insufficiency, she is followed by nephrologist and [place]. The patient was advised to call immediately if she has any concerning symptoms in the interval. The patient voices understanding of current disease status and treatment options and is in agreement with the current care plan.  All questions were answered. The patient knows to call the clinic with any problems, questions or concerns. We can certainly see the patient much sooner if necessary.  Disclaimer: This note was dictated with voice recognition software. Similar sounding words can inadvertently be transcribed and may not be corrected upon review.

## 2016-04-06 DIAGNOSIS — Z79899 Other long term (current) drug therapy: Secondary | ICD-10-CM | POA: Diagnosis not present

## 2016-04-06 DIAGNOSIS — Z7189 Other specified counseling: Secondary | ICD-10-CM | POA: Diagnosis not present

## 2016-04-06 DIAGNOSIS — E782 Mixed hyperlipidemia: Secondary | ICD-10-CM | POA: Diagnosis not present

## 2016-04-06 DIAGNOSIS — N183 Chronic kidney disease, stage 3 (moderate): Secondary | ICD-10-CM | POA: Diagnosis not present

## 2016-04-06 DIAGNOSIS — E559 Vitamin D deficiency, unspecified: Secondary | ICD-10-CM | POA: Diagnosis not present

## 2016-04-06 DIAGNOSIS — Z0000 Encounter for general adult medical examination without abnormal findings: Secondary | ICD-10-CM | POA: Diagnosis not present

## 2016-04-06 DIAGNOSIS — C3430 Malignant neoplasm of lower lobe, unspecified bronchus or lung: Secondary | ICD-10-CM | POA: Diagnosis not present

## 2016-04-06 DIAGNOSIS — J309 Allergic rhinitis, unspecified: Secondary | ICD-10-CM | POA: Diagnosis not present

## 2016-04-06 DIAGNOSIS — M1611 Unilateral primary osteoarthritis, right hip: Secondary | ICD-10-CM | POA: Diagnosis not present

## 2016-04-06 DIAGNOSIS — F17201 Nicotine dependence, unspecified, in remission: Secondary | ICD-10-CM | POA: Diagnosis not present

## 2016-04-06 DIAGNOSIS — Z1389 Encounter for screening for other disorder: Secondary | ICD-10-CM | POA: Diagnosis not present

## 2016-04-06 DIAGNOSIS — I10 Essential (primary) hypertension: Secondary | ICD-10-CM | POA: Diagnosis not present

## 2016-04-11 ENCOUNTER — Other Ambulatory Visit: Payer: Self-pay | Admitting: Internal Medicine

## 2016-04-11 DIAGNOSIS — N183 Chronic kidney disease, stage 3 unspecified: Secondary | ICD-10-CM

## 2016-04-17 ENCOUNTER — Encounter: Payer: Medicare Other | Admitting: Thoracic Surgery (Cardiothoracic Vascular Surgery)

## 2016-04-19 ENCOUNTER — Other Ambulatory Visit: Payer: Self-pay | Admitting: Thoracic Surgery (Cardiothoracic Vascular Surgery)

## 2016-04-19 DIAGNOSIS — C3490 Malignant neoplasm of unspecified part of unspecified bronchus or lung: Secondary | ICD-10-CM

## 2016-04-24 ENCOUNTER — Encounter: Payer: Self-pay | Admitting: Thoracic Surgery (Cardiothoracic Vascular Surgery)

## 2016-04-24 ENCOUNTER — Ambulatory Visit (INDEPENDENT_AMBULATORY_CARE_PROVIDER_SITE_OTHER): Payer: Medicare Other | Admitting: Thoracic Surgery (Cardiothoracic Vascular Surgery)

## 2016-04-24 ENCOUNTER — Ambulatory Visit
Admission: RE | Admit: 2016-04-24 | Discharge: 2016-04-24 | Disposition: A | Discharge status: Home / Self Care | Payer: Medicare Other | Source: Ambulatory Visit | Attending: Thoracic Surgery (Cardiothoracic Vascular Surgery) | Admitting: Thoracic Surgery (Cardiothoracic Vascular Surgery)

## 2016-04-24 VITALS — BP 143/79 | HR 84 | Resp 16 | Ht 64.0 in | Wt 134.0 lb

## 2016-04-24 DIAGNOSIS — C3491 Malignant neoplasm of unspecified part of right bronchus or lung: Secondary | ICD-10-CM

## 2016-04-24 DIAGNOSIS — Z902 Acquired absence of lung [part of]: Secondary | ICD-10-CM | POA: Diagnosis not present

## 2016-04-24 DIAGNOSIS — C3490 Malignant neoplasm of unspecified part of unspecified bronchus or lung: Secondary | ICD-10-CM

## 2016-04-24 DIAGNOSIS — C50911 Malignant neoplasm of unspecified site of right female breast: Secondary | ICD-10-CM | POA: Diagnosis not present

## 2016-04-24 NOTE — Progress Notes (Signed)
[address]       [place] [address]             [phone]       HPI: Mrs. Mincy returns for a 1 year follow up visit  She is a 70 year old woman who had a nodule in her right upper lobe that was discovered on a low-dose screening CT scan. She underwent a thoracoscopic right upper lobectomy on 04/29/2015 for a stage IB (T2a, N0) non-small cell carcinoma. She did well postoperatively and was discharged home on day 4.  She received 4 cycles of adjuvant chemotherapy with carboplatin and gemcitabine.  She saw Dr. Julien Nordmann in May.  She feels well overall. She does still have some "soreness" in the area of her incision. She has not had any problems with her breathing. Her appetite is good. Her weight is stable. She's not had any unusual headaches or visual changes. She has not smoked since her surgery and says she's only really craved it one time, but that she will start smoking again if she learns that cancer has come back.  Past Medical History  Diagnosis Date  . Hypertension   . Hypercholesterolemia   . Tobacco dependence   . GERD (gastroesophageal reflux disease)   . Sciatica of right side   . Seasonal allergic rhinitis   . Acute mastitis of right breast     10/2003  . Family history of breast cancer   . Colon polyps   . Tubular adenoma of colon   . Vitamin D deficiency   . COPD (chronic obstructive pulmonary disease) (HCC)     emphysema  . Arthritis     bursitis in right shoulder  . Chronic kidney disease     CKD stage 3 04/28/15  . NSCL ca dx'd 03/2015    chemo; rllobectomy    Current Outpatient Prescriptions  Medication Sig Dispense Refill  . acetaminophen (TYLENOL) 325 MG tablet Take 650 mg by mouth every 6 (six) hours as needed. Reported on 03/27/2016    . aspirin 81 MG tablet Take 81 mg by mouth daily.    . cholecalciferol (VITAMIN D) 1000 UNITS tablet Take 1,000 Units by mouth daily.    Marland Kitchen lisinopril-hydrochlorothiazide (PRINZIDE,ZESTORETIC)  20-12.5 MG per tablet Take 1 tablet by mouth daily.     Marland Kitchen loratadine (CLARITIN) 10 MG tablet Take 10 mg by mouth daily as needed.     . metoprolol succinate (TOPROL-XL) 50 MG 24 hr tablet Take 50 mg by mouth daily.     . Multiple Vitamins-Minerals (MULTIVITAMIN PO) Take 1 tablet by mouth daily.    . simvastatin (ZOCOR) 20 MG tablet Take 20 mg by mouth 3 (three) times a week. Mon Wed Fri     No current facility-administered medications for this visit.    Physical Exam BP 143/79 mmHg  Pulse 84  Resp 16  Ht '5\' 4"'$  (1.626 m)  Wt 134 lb (60.782 kg)  BMI 22.99 kg/m2  SpO61 50% 70 year old woman in no acute distress Alert and oriented 3 with no focal deficits No cervical or supra clavicular adenopathy Cardiac regular rate and rhythm normal S1 and S2 Lungs clear with equal breath sounds bilaterally Incisions well healed  Diagnostic Tests: CHEST 2 VIEW  COMPARISON: CT 03/20/2016  FINDINGS: Heart and mediastinal contours are within normal limits. No focal opacities or effusions. No acute bony abnormality.  IMPRESSION: No active cardiopulmonary disease.   Electronically Signed  By: Rolm Baptise M.D.  On: 04/24/2016 09:42  CT CHEST WITHOUT CONTRAST  TECHNIQUE: Multidetector CT imaging of the chest was performed following the standard protocol without IV contrast.  COMPARISON: 09/26/2015  FINDINGS: Mediastinum/Lymph Nodes: No masses or pathologically enlarged lymph nodes identified on this un-enhanced exam. Moderate size hiatal hernia again seen. Three-vessel coronary artery calcification again noted as well as calcified plaque and evolving the thoracic aorta.  Lungs/Pleura: Postop changes again seen from previous right lower lobectomy. Mild to moderate centrilobular emphysema again demonstrated. Scattered perifissural sub-cm nodules in both lungs remain stable and are most consistent with benign intrapulmonary lymph nodes. Focal ill-defined ground-glass  opacities in both posterior upper lobes on image 33 of series 5 are also stable and likely benign. No evidence of pleural effusion.  Upper abdomen: Both adrenal glands remain normal in appearance. Tiny calcified gallstones again noted as well as a tiny nonobstructive calculus in the upper pole of the right kidney as well as renal cysts.  Musculoskeletal: No chest wall mass or suspicious bone lesions identified.  IMPRESSION: Stable exam. No definite evidence of recurrent or metastatic carcinoma within the thorax.  Stable bilateral upper lobe ground-glass opacities and probable benign intrapulmonary lymph nodes. Recommend continued attention on follow-up imaging.  Stable emphysema, moderate size hiatal hernia and small calcified gallstones.   Electronically Signed  By: Earle Gell M.D.  On: 03/20/2016 17:25  I personally reviewed thr CXR and the CT from May and concur with the findings noted above Impression:  70 yo woman with stage IB non-small cell carcinoma who had a thoracoscopic right upper lobectomy followed by adjuvant chemotherapy. She is now a year out from surgery with no evidence of recurrent disease.  Smoking cessation instruction/counseling given:  commended patient for quitting and reviewed strategies for preventing relapses. I did encourage her not to restart smoking even if she doesn't up with recurrent cancer.  Hypertension- blood pressure is mildly elevated today. She is on medication. She will follow-up with her primary.  She is an appointment to see Dr. Julien Nordmann back with another CT scan in 6 months.  Plan: Follow up a scheduled with Dr. Julien Nordmann  Return in one year  [name], MD [facility] [phone]

## 2016-05-03 ENCOUNTER — Ambulatory Visit
Admission: RE | Admit: 2016-05-03 | Discharge: 2016-05-03 | Disposition: A | Discharge status: Home / Self Care | Payer: Medicare Other | Source: Ambulatory Visit | Attending: Internal Medicine | Admitting: Internal Medicine

## 2016-05-03 DIAGNOSIS — N183 Chronic kidney disease, stage 3 unspecified: Secondary | ICD-10-CM

## 2016-05-03 DIAGNOSIS — Z09 Encounter for follow-up examination after completed treatment for conditions other than malignant neoplasm: Secondary | ICD-10-CM | POA: Diagnosis not present

## 2016-05-03 DIAGNOSIS — N6002 Solitary cyst of left breast: Secondary | ICD-10-CM | POA: Diagnosis not present

## 2016-05-03 DIAGNOSIS — N63 Unspecified lump in breast: Secondary | ICD-10-CM | POA: Diagnosis not present

## 2016-05-03 DIAGNOSIS — Z803 Family history of malignant neoplasm of breast: Secondary | ICD-10-CM | POA: Diagnosis not present

## 2016-09-18 ENCOUNTER — Other Ambulatory Visit (HOSPITAL_BASED_OUTPATIENT_CLINIC_OR_DEPARTMENT_OTHER): Payer: Medicare Other

## 2016-09-18 ENCOUNTER — Ambulatory Visit (HOSPITAL_COMMUNITY)
Admission: RE | Admit: 2016-09-18 | Discharge: 2016-09-18 | Disposition: A | Discharge status: Home / Self Care | Payer: Medicare Other | Source: Ambulatory Visit | Attending: Internal Medicine | Admitting: Internal Medicine

## 2016-09-18 ENCOUNTER — Encounter (HOSPITAL_COMMUNITY): Payer: Self-pay

## 2016-09-18 DIAGNOSIS — R918 Other nonspecific abnormal finding of lung field: Secondary | ICD-10-CM | POA: Insufficient documentation

## 2016-09-18 DIAGNOSIS — K8020 Calculus of gallbladder without cholecystitis without obstruction: Secondary | ICD-10-CM | POA: Diagnosis not present

## 2016-09-18 DIAGNOSIS — C3431 Malignant neoplasm of lower lobe, right bronchus or lung: Secondary | ICD-10-CM

## 2016-09-18 DIAGNOSIS — I700 Atherosclerosis of aorta: Secondary | ICD-10-CM | POA: Diagnosis not present

## 2016-09-18 DIAGNOSIS — I2510 Atherosclerotic heart disease of native coronary artery without angina pectoris: Secondary | ICD-10-CM | POA: Insufficient documentation

## 2016-09-18 DIAGNOSIS — Z85118 Personal history of other malignant neoplasm of bronchus and lung: Secondary | ICD-10-CM | POA: Diagnosis not present

## 2016-09-18 DIAGNOSIS — C3491 Malignant neoplasm of unspecified part of right bronchus or lung: Secondary | ICD-10-CM | POA: Insufficient documentation

## 2016-09-18 DIAGNOSIS — N200 Calculus of kidney: Secondary | ICD-10-CM | POA: Diagnosis not present

## 2016-09-18 DIAGNOSIS — K449 Diaphragmatic hernia without obstruction or gangrene: Secondary | ICD-10-CM | POA: Diagnosis not present

## 2016-09-18 DIAGNOSIS — Z902 Acquired absence of lung [part of]: Secondary | ICD-10-CM | POA: Diagnosis not present

## 2016-09-18 LAB — CBC WITH DIFFERENTIAL/PLATELET
BASO%: 0.3 % (ref 0.0–2.0)
Basophils Absolute: 0 10*3/uL (ref 0.0–0.1)
EOS ABS: 0.2 10*3/uL (ref 0.0–0.5)
EOS%: 1.9 % (ref 0.0–7.0)
HEMATOCRIT: 32.2 % — AB (ref 34.8–46.6)
HGB: 10.6 g/dL — ABNORMAL LOW (ref 11.6–15.9)
LYMPH%: 37.4 % (ref 14.0–49.7)
MCH: 30.1 pg (ref 25.1–34.0)
MCHC: 32.9 g/dL (ref 31.5–36.0)
MCV: 91.5 fL (ref 79.5–101.0)
MONO#: 0.6 10*3/uL (ref 0.1–0.9)
MONO%: 6.7 % (ref 0.0–14.0)
NEUT%: 53.7 % (ref 38.4–76.8)
NEUTROS ABS: 5.1 10*3/uL (ref 1.5–6.5)
Platelets: 303 10*3/uL (ref 145–400)
RBC: 3.52 10*6/uL — AB (ref 3.70–5.45)
RDW: 12.8 % (ref 11.2–14.5)
WBC: 9.5 10*3/uL (ref 3.9–10.3)
lymph#: 3.5 10*3/uL — ABNORMAL HIGH (ref 0.9–3.3)

## 2016-09-18 LAB — COMPREHENSIVE METABOLIC PANEL
ALT: 12 U/L (ref 0–55)
AST: 19 U/L (ref 5–34)
Albumin: 3.6 g/dL (ref 3.5–5.0)
Alkaline Phosphatase: 123 U/L (ref 40–150)
Anion Gap: 12 mEq/L — ABNORMAL HIGH (ref 3–11)
BUN: 39.6 mg/dL — AB (ref 7.0–26.0)
CALCIUM: 9.8 mg/dL (ref 8.4–10.4)
CHLORIDE: 111 meq/L — AB (ref 98–109)
CO2: 22 meq/L (ref 22–29)
CREATININE: 2.8 mg/dL — AB (ref 0.6–1.1)
EGFR: 19 mL/min/{1.73_m2} — ABNORMAL LOW (ref >90)
Glucose: 93 mg/dl (ref 70–140)
POTASSIUM: 4.9 meq/L (ref 3.5–5.1)
SODIUM: 145 meq/L (ref 136–145)
Total Bilirubin: 0.35 mg/dL (ref 0.20–1.20)
Total Protein: 7.6 g/dL (ref 6.4–8.3)

## 2016-09-25 ENCOUNTER — Ambulatory Visit: Payer: Medicare Other | Admitting: Internal Medicine

## 2016-10-18 DIAGNOSIS — N6320 Unspecified lump in the left breast, unspecified quadrant: Secondary | ICD-10-CM | POA: Diagnosis not present

## 2016-10-18 DIAGNOSIS — N6321 Unspecified lump in the left breast, upper outer quadrant: Secondary | ICD-10-CM | POA: Diagnosis not present

## 2016-10-18 DIAGNOSIS — Z803 Family history of malignant neoplasm of breast: Secondary | ICD-10-CM | POA: Diagnosis not present

## 2017-04-11 DIAGNOSIS — I10 Essential (primary) hypertension: Secondary | ICD-10-CM | POA: Diagnosis not present

## 2017-04-11 DIAGNOSIS — C3430 Malignant neoplasm of lower lobe, unspecified bronchus or lung: Secondary | ICD-10-CM | POA: Diagnosis not present

## 2017-04-11 DIAGNOSIS — Z79899 Other long term (current) drug therapy: Secondary | ICD-10-CM | POA: Diagnosis not present

## 2017-04-11 DIAGNOSIS — N183 Chronic kidney disease, stage 3 (moderate): Secondary | ICD-10-CM | POA: Diagnosis not present

## 2017-04-11 DIAGNOSIS — Z1389 Encounter for screening for other disorder: Secondary | ICD-10-CM | POA: Diagnosis not present

## 2017-04-11 DIAGNOSIS — M1611 Unilateral primary osteoarthritis, right hip: Secondary | ICD-10-CM | POA: Diagnosis not present

## 2017-04-11 DIAGNOSIS — J309 Allergic rhinitis, unspecified: Secondary | ICD-10-CM | POA: Diagnosis not present

## 2017-04-11 DIAGNOSIS — F17201 Nicotine dependence, unspecified, in remission: Secondary | ICD-10-CM | POA: Diagnosis not present

## 2017-04-11 DIAGNOSIS — Z0000 Encounter for general adult medical examination without abnormal findings: Secondary | ICD-10-CM | POA: Diagnosis not present

## 2017-04-11 DIAGNOSIS — E559 Vitamin D deficiency, unspecified: Secondary | ICD-10-CM | POA: Diagnosis not present

## 2017-04-11 DIAGNOSIS — E782 Mixed hyperlipidemia: Secondary | ICD-10-CM | POA: Diagnosis not present

## 2017-04-23 DIAGNOSIS — N6311 Unspecified lump in the right breast, upper outer quadrant: Secondary | ICD-10-CM | POA: Diagnosis not present

## 2017-04-23 DIAGNOSIS — I10 Essential (primary) hypertension: Secondary | ICD-10-CM | POA: Diagnosis not present

## 2017-04-23 DIAGNOSIS — N184 Chronic kidney disease, stage 4 (severe): Secondary | ICD-10-CM | POA: Diagnosis not present

## 2017-04-23 DIAGNOSIS — R928 Other abnormal and inconclusive findings on diagnostic imaging of breast: Secondary | ICD-10-CM | POA: Diagnosis not present

## 2017-04-23 DIAGNOSIS — N6321 Unspecified lump in the left breast, upper outer quadrant: Secondary | ICD-10-CM | POA: Diagnosis not present

## 2017-05-07 ENCOUNTER — Ambulatory Visit: Payer: Medicare Other | Admitting: Thoracic Surgery (Cardiothoracic Vascular Surgery)

## 2017-05-17 DIAGNOSIS — N184 Chronic kidney disease, stage 4 (severe): Secondary | ICD-10-CM | POA: Diagnosis not present

## 2017-05-17 DIAGNOSIS — I129 Hypertensive chronic kidney disease with stage 1 through stage 4 chronic kidney disease, or unspecified chronic kidney disease: Secondary | ICD-10-CM | POA: Diagnosis not present

## 2017-05-17 DIAGNOSIS — N2581 Secondary hyperparathyroidism of renal origin: Secondary | ICD-10-CM | POA: Diagnosis not present

## 2017-06-04 ENCOUNTER — Ambulatory Visit: Payer: Medicare Other | Admitting: Thoracic Surgery (Cardiothoracic Vascular Surgery)

## 2017-06-10 ENCOUNTER — Other Ambulatory Visit: Payer: Self-pay | Admitting: Thoracic Surgery (Cardiothoracic Vascular Surgery)

## 2017-06-10 DIAGNOSIS — C3490 Malignant neoplasm of unspecified part of unspecified bronchus or lung: Secondary | ICD-10-CM

## 2017-06-11 ENCOUNTER — Other Ambulatory Visit: Payer: Self-pay | Admitting: *Deleted

## 2017-06-11 ENCOUNTER — Ambulatory Visit
Admission: RE | Admit: 2017-06-11 | Discharge: 2017-06-11 | Disposition: A | Discharge status: Home / Self Care | Payer: Medicare Other | Source: Ambulatory Visit | Attending: Thoracic Surgery (Cardiothoracic Vascular Surgery) | Admitting: Thoracic Surgery (Cardiothoracic Vascular Surgery)

## 2017-06-11 ENCOUNTER — Ambulatory Visit (INDEPENDENT_AMBULATORY_CARE_PROVIDER_SITE_OTHER): Payer: Medicare Other | Admitting: Thoracic Surgery (Cardiothoracic Vascular Surgery)

## 2017-06-11 ENCOUNTER — Encounter: Payer: Self-pay | Admitting: Thoracic Surgery (Cardiothoracic Vascular Surgery)

## 2017-06-11 ENCOUNTER — Telehealth: Payer: Self-pay | Admitting: *Deleted

## 2017-06-11 VITALS — BP 165/80 | HR 87 | Resp 16 | Ht 64.0 in | Wt 144.0 lb

## 2017-06-11 DIAGNOSIS — I700 Atherosclerosis of aorta: Secondary | ICD-10-CM | POA: Diagnosis not present

## 2017-06-11 DIAGNOSIS — C3491 Malignant neoplasm of unspecified part of right bronchus or lung: Secondary | ICD-10-CM | POA: Diagnosis not present

## 2017-06-11 DIAGNOSIS — Z85118 Personal history of other malignant neoplasm of bronchus and lung: Secondary | ICD-10-CM

## 2017-06-11 DIAGNOSIS — Z902 Acquired absence of lung [part of]: Secondary | ICD-10-CM | POA: Diagnosis not present

## 2017-06-11 DIAGNOSIS — C3490 Malignant neoplasm of unspecified part of unspecified bronchus or lung: Secondary | ICD-10-CM

## 2017-06-11 NOTE — Telephone Encounter (Signed)
Oncology Nurse Navigator Documentation  Oncology Nurse Navigator Flowsheets 06/11/2017  Navigator Location [facility]  Navigator Encounter Type Telephone/I received a message from Dr. Roxan Hockey.  Patient needs to be seen again with Dr. Julien Nordmann.  I updated Dr. Julien Nordmann. He would like CT scan of chest then a follow up with him.  I called Ms. Lauver to understanding why she was a no show Nov 2017.  I was unable to reach. I called [facility] office regarding Ms. Spark.  I was told CT chest was ordered and they would schedule it.  I asked them when they call her with that appt, to please call me.   They will give Ms. Machia the message.    Telephone Outgoing Call  Treatment Phase Other  Barriers/Navigation Needs Coordination of Care  Interventions Coordination of Care  Coordination of Care Other  Acuity Level 1  Time Spent with Patient 15

## 2017-06-11 NOTE — Progress Notes (Signed)
[address]       [place] [address]             [phone]     HPI: Betty [name] returns for a scheduled follow-up.  She is a 71 year old former smoker who was found to have a right upper lobe nodule on a low-dose screening CT in 2016. She had a thoracoscopic right upper lobectomy in June 2016. The nodule turned out to be a T2a, N0, stage IB non-small cell carcinoma. She received 4 cycles of adjuvant therapy with carboplatin and gemcitabine.  She had a CT scan in November 2017 but it does not appear that she saw Dr. Julien Nordmann at that time. The CT showed no evidence of recurrent disease.  She says that she still has some pain, which she describes as a "little ache." At the surgical site. She otherwise feels well. Her appetite is good and she has gained weight since she quit smoking. She has not smoked since her surgery. She's not had any unusual headaches or visual changes. She denies unusual bone or joint pain. Past Medical History:  Diagnosis Date  . Acute mastitis of right breast    10/2003  . Arthritis    bursitis in right shoulder  . Chronic kidney disease    CKD stage 3 04/28/15  . Colon polyps   . 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 Family history of breast cancer   . GERD (gastroesophageal reflux disease)   . Hypercholesterolemia   . Hypertension   . NSCL ca dx'd 03/2015   chemo; rllobectomy  . Sciatica of right side   . Seasonal allergic rhinitis   . Tobacco dependence   . Tubular adenoma of colon   . Vitamin D deficiency      Current Outpatient Prescriptions  Medication Sig Dispense Refill  . acetaminophen (TYLENOL) 325 MG tablet Take 650 mg by mouth every 6 (six) hours as needed. Reported on 03/27/2016    . aspirin 81 MG tablet Take 81 mg by mouth daily.    Marland Kitchen lisinopril-hydrochlorothiazide (PRINZIDE,ZESTORETIC) 20-12.5 MG per tablet Take 2 tablets by mouth daily.     Marland Kitchen loratadine (CLARITIN) 10 MG tablet Take 10 mg  by mouth daily as needed.     . metoprolol succinate (TOPROL-XL) 50 MG 24 hr tablet Take 50 mg by mouth daily.     . Multiple Vitamins-Minerals (MULTIVITAMIN PO) Take 1 tablet by mouth daily.    . simvastatin (ZOCOR) 20 MG tablet Take 20 mg by mouth 3 (three) times a week. Mon Wed Fri     No current facility-administered medications for this visit.     Physical Exam BP (!) 165/80 (BP Location: Right Arm, Patient Position: Sitting, Cuff Size: Large)   Pulse 87   Resp 16   Ht 5\' 4"  (1.626 m)   Wt 144 lb (65.3 kg)   SpO2 98% Comment: ON RA  BMI 24.50 kg/m  71 year old woman in no acute distress Alert and oriented 3 with no focal deficits No cervical or supraclavicular adenopathy Cardiac regular rate and rhythm normal S1 and S2 Lungs clear with equal breath sounds bilaterally Incisions well healed  Diagnostic Tests: CHEST  2 VIEW  COMPARISON:  Chest x-ray 04/24/2016.  FINDINGS: Lung volumes are normal. No consolidative airspace disease. No pleural effusions. No pneumothorax. No pulmonary nodule or mass noted. Pulmonary vasculature and the cardiomediastinal silhouette are within normal limits. Mild bilateral apical nodular pleuroparenchymal  thickening (left greater than right), similar to the prior study, presumably from chronic post infectious or inflammatory scarring. Surgical clips in the right hilar region. Atherosclerosis in the thoracic aorta.  IMPRESSION: 1.  No radiographic evidence of acute cardiopulmonary disease. 2. Aortic atherosclerosis.   Electronically Signed   By: Vinnie Langton M.D.   On: 06/11/2017 13:35 I personally reviewed the chest x-ray and concur with the findings noted above  Impression: Betty [name] is a 71 year old woman who had a thoracoscopic right upper lobectomy followed by adjuvant chemotherapy with carboplatin and gemcitabine for a stage IB non-small cell carcinoma back in 2016. She is now 2 years out with no evidence recurrent  disease.  She has been lost to follow-up for about a year now. She did have a CT back in November 2017 which showed no evidence recurrent disease. She now is due for another CT, so I will go ahead and schedule that.  I'll contact Dr. Worthy Flank office and see if we get her back in with him for follow-up.  Smoking cessation instruction/counseling given:  commended patient for quitting and reviewed strategies for preventing relapses   Plan: She should continue to follow-up with Dr. Julien Nordmann.  I will be happy to see her back any time if I can be of any further assistance with her care.  Betty Nakayama, MD [facility] [phone]

## 2017-06-13 ENCOUNTER — Ambulatory Visit
Admission: RE | Admit: 2017-06-13 | Discharge: 2017-06-13 | Disposition: A | Discharge status: Home / Self Care | Payer: Medicare Other | Source: Ambulatory Visit | Attending: Thoracic Surgery (Cardiothoracic Vascular Surgery) | Admitting: Thoracic Surgery (Cardiothoracic Vascular Surgery)

## 2017-06-13 ENCOUNTER — Telehealth: Payer: Self-pay | Admitting: *Deleted

## 2017-06-13 DIAGNOSIS — Z85118 Personal history of other malignant neoplasm of bronchus and lung: Secondary | ICD-10-CM

## 2017-06-13 DIAGNOSIS — R918 Other nonspecific abnormal finding of lung field: Secondary | ICD-10-CM | POA: Diagnosis not present

## 2017-06-13 NOTE — Telephone Encounter (Signed)
Oncology Nurse Navigator Documentation  Oncology Nurse Navigator Flowsheets 06/13/2017  Navigator Location [facility]  Navigator Encounter Type Telephone/I received a message from [facility] that patient needs follow up with Dr. Julien Nordmann. I was given the sons phone number to call.  I called him and he asked that I call Betty [name]. I called twice and there was no answer.    Telephone Outgoing Call  Treatment Phase Other  Barriers/Navigation Needs Coordination of Care  Interventions Coordination of Care  Coordination of Care Other  Acuity Level 2  Time Spent with Patient 30

## 2017-06-13 NOTE — Telephone Encounter (Deleted)
  Oncology Nurse Navigator Documentation  Navigator Location: [facility] (06/13/17 1000)   )Navigator Encounter Type: Telephone (06/13/17 1000) Telephone: Lahoma Crocker Call (06/13/17 1000)                     Treatment Phase: Other (06/13/17 1000) Barriers/Navigation Needs: Coordination of Care (06/13/17 1000)   Interventions: Coordination of Care (06/13/17 1000)   Coordination of Care: Other (06/13/17 1000)        Acuity: Level 2 (06/13/17 1000)         Time Spent with Patient: 30 (06/13/17 1000)

## 2017-06-14 ENCOUNTER — Telehealth: Payer: Self-pay | Admitting: *Deleted

## 2017-06-14 NOTE — Telephone Encounter (Signed)
Oncology Nurse Navigator Documentation  Oncology Nurse Navigator Flowsheets 06/14/2017  Navigator Location [facility]  Navigator Encounter Type Telephone/I called to set up appt for Betty [name] to be seen with Dr. Julien Nordmann. I was unable to reach her.  I was unable to leave vm message.   Telephone Outgoing Call  Treatment Phase Other  Barriers/Navigation Needs Coordination of Care  Interventions Coordination of Care  Coordination of Care Other  Acuity Level 1  Time Spent with Patient 15

## 2017-07-15 ENCOUNTER — Telehealth: Payer: Self-pay | Admitting: *Deleted

## 2017-07-15 NOTE — Telephone Encounter (Signed)
Oncology Nurse Navigator Documentation  Oncology Nurse Navigator Flowsheets 07/15/2017  Navigator Location [facility]  Navigator Encounter Type Telephone/I called to schedule Betty [name] for a follow up. I was unable to reach her or leave vm message.   Telephone Outgoing Call  Treatment Phase Follow-up  Barriers/Navigation Needs Coordination of Care  Interventions Coordination of Care  Acuity Level 1  Time Spent with Patient 15

## 2017-07-26 ENCOUNTER — Telehealth: Payer: Self-pay | Admitting: *Deleted

## 2017-07-26 NOTE — Telephone Encounter (Signed)
Oncology Nurse Navigator Documentation  Oncology Nurse Navigator Flowsheets 07/26/2017  Navigator Location [facility]  Navigator Encounter Type Telephone/I called to set up a follow up appt with Dr. Julien Nordmann. I was unable to reach patient.  Telephone Outgoing Call  Treatment Phase Follow-up  Barriers/Navigation Needs Coordination of Care  Interventions Coordination of Care  Acuity Level 1  Time Spent with Patient 15

## 2017-08-21 DIAGNOSIS — I129 Hypertensive chronic kidney disease with stage 1 through stage 4 chronic kidney disease, or unspecified chronic kidney disease: Secondary | ICD-10-CM | POA: Diagnosis not present

## 2017-08-21 DIAGNOSIS — N2581 Secondary hyperparathyroidism of renal origin: Secondary | ICD-10-CM | POA: Diagnosis not present

## 2017-08-21 DIAGNOSIS — C3490 Malignant neoplasm of unspecified part of unspecified bronchus or lung: Secondary | ICD-10-CM | POA: Diagnosis not present

## 2017-08-21 DIAGNOSIS — N184 Chronic kidney disease, stage 4 (severe): Secondary | ICD-10-CM | POA: Diagnosis not present

## 2017-09-25 ENCOUNTER — Telehealth: Payer: Self-pay | Admitting: *Deleted

## 2017-09-25 NOTE — Telephone Encounter (Signed)
Oncology Nurse Navigator Documentation  Oncology Nurse Navigator Flowsheets 09/25/2017  Navigator Location [facility]  Navigator Encounter Type Telephone/I called Ms. Vandervelden to schedule a follow up with Dr. Julien Nordmann. I was unable to reach her. I called her son but was unable to reach him. I did leave him a vm message to call with my name and phone number.   Telephone Outgoing Call  Treatment Phase Follow-up  Barriers/Navigation Needs Coordination of Care  Interventions Coordination of Care  Acuity Level 2  Acuity Level 2 Other  Time Spent with Patient 30

## 2017-10-11 ENCOUNTER — Telehealth: Payer: Self-pay | Admitting: *Deleted

## 2017-10-11 NOTE — Telephone Encounter (Signed)
Oncology Nurse Navigator Documentation  Oncology Nurse Navigator Flowsheets 10/11/2017  Navigator Location [facility]  Navigator Encounter Type Telephone/Ms. Lucena called me yesterday to re-schedule. I was unable to re-schedule at that time. I called her today to re-schedule but was unable to reach or leave a vm message.   Telephone Outgoing Call;Incoming Call  Treatment Phase Follow-up  Barriers/Navigation Needs Coordination of Care  Interventions Coordination of Care  Acuity Level 1  Time Spent with Patient 30

## 2017-10-21 ENCOUNTER — Telehealth: Payer: Self-pay | Admitting: *Deleted

## 2017-10-21 NOTE — Telephone Encounter (Signed)
Oncology Nurse Navigator Documentation  Oncology Nurse Navigator Flowsheets 10/21/2017  Navigator Location [facility]  Navigator Encounter Type Telephone/I called to schedule Betty [name]. I was unable to reach her. I was unable to leave a vm message   Telephone Outgoing Call  Treatment Phase Follow-up  Barriers/Navigation Needs Coordination of Care  Interventions Coordination of Care  Acuity Level 1  Time Spent with Patient 15

## 2017-12-11 ENCOUNTER — Telehealth: Payer: Self-pay | Admitting: *Deleted

## 2017-12-11 NOTE — Telephone Encounter (Signed)
Oncology Nurse Navigator Documentation  Oncology Nurse Navigator Flowsheets 12/11/2017  Navigator Location [facility]  Navigator Encounter Type Telephone/I called Betty [name] today to schedule her for a follow up. I was unable to reach or leave a vm message.   Telephone Outgoing Call  Treatment Phase Follow-up  Barriers/Navigation Needs Coordination of Care  Interventions Coordination of Care  Coordination of Care Other  Acuity Level 1  Time Spent with Patient 15

## 2018-01-08 ENCOUNTER — Telehealth: Payer: Self-pay | Admitting: Internal Medicine

## 2018-01-08 ENCOUNTER — Encounter: Payer: Self-pay | Admitting: *Deleted

## 2018-01-08 NOTE — Progress Notes (Signed)
Oncology Nurse Navigator Documentation  Oncology Nurse Navigator Flowsheets 01/08/2018  Navigator Location [facility]  Navigator Encounter Type Other/I have tried to reach Betty [name] to schedule her for a follow up appt to see Dr. Julien Nordmann. I have not been able to reach her.  I updated scheduling team to call and re-schedule Betty [name] for a follow up appt.   Treatment Phase Follow-up  Barriers/Navigation Needs Coordination of Care  Interventions Coordination of Care  Coordination of Care Other  Acuity Level 1  Time Spent with Patient 15

## 2018-01-08 NOTE — Telephone Encounter (Signed)
No answer and unable to leave vmail - sent reminder letter in the mail per 3/6 sch message -

## 2018-02-04 ENCOUNTER — Other Ambulatory Visit: Payer: Self-pay | Admitting: Medical Oncology

## 2018-02-04 ENCOUNTER — Telehealth: Payer: Self-pay | Admitting: Internal Medicine

## 2018-02-04 ENCOUNTER — Inpatient Hospital Stay: Payer: Medicare Other | Attending: Internal Medicine | Admitting: Internal Medicine

## 2018-02-04 ENCOUNTER — Inpatient Hospital Stay: Payer: Medicare Other

## 2018-02-04 ENCOUNTER — Encounter: Payer: Self-pay | Admitting: Internal Medicine

## 2018-02-04 DIAGNOSIS — C3490 Malignant neoplasm of unspecified part of unspecified bronchus or lung: Secondary | ICD-10-CM

## 2018-02-04 DIAGNOSIS — Z85118 Personal history of other malignant neoplasm of bronchus and lung: Secondary | ICD-10-CM | POA: Diagnosis not present

## 2018-02-04 DIAGNOSIS — E559 Vitamin D deficiency, unspecified: Secondary | ICD-10-CM | POA: Diagnosis not present

## 2018-02-04 DIAGNOSIS — Z902 Acquired absence of lung [part of]: Secondary | ICD-10-CM | POA: Insufficient documentation

## 2018-02-04 DIAGNOSIS — I129 Hypertensive chronic kidney disease with stage 1 through stage 4 chronic kidney disease, or unspecified chronic kidney disease: Secondary | ICD-10-CM | POA: Insufficient documentation

## 2018-02-04 DIAGNOSIS — Z79899 Other long term (current) drug therapy: Secondary | ICD-10-CM | POA: Diagnosis not present

## 2018-02-04 DIAGNOSIS — Z86010 Personal history of colonic polyps: Secondary | ICD-10-CM | POA: Diagnosis not present

## 2018-02-04 DIAGNOSIS — K219 Gastro-esophageal reflux disease without esophagitis: Secondary | ICD-10-CM | POA: Insufficient documentation

## 2018-02-04 DIAGNOSIS — J449 Chronic obstructive pulmonary disease, unspecified: Secondary | ICD-10-CM | POA: Diagnosis not present

## 2018-02-04 DIAGNOSIS — N183 Chronic kidney disease, stage 3 (moderate): Secondary | ICD-10-CM | POA: Diagnosis not present

## 2018-02-04 DIAGNOSIS — Z803 Family history of malignant neoplasm of breast: Secondary | ICD-10-CM | POA: Insufficient documentation

## 2018-02-04 DIAGNOSIS — C3491 Malignant neoplasm of unspecified part of right bronchus or lung: Secondary | ICD-10-CM

## 2018-02-04 DIAGNOSIS — Z7982 Long term (current) use of aspirin: Secondary | ICD-10-CM | POA: Diagnosis not present

## 2018-02-04 DIAGNOSIS — F17210 Nicotine dependence, cigarettes, uncomplicated: Secondary | ICD-10-CM | POA: Insufficient documentation

## 2018-02-04 DIAGNOSIS — M129 Arthropathy, unspecified: Secondary | ICD-10-CM | POA: Insufficient documentation

## 2018-02-04 DIAGNOSIS — Z9221 Personal history of antineoplastic chemotherapy: Secondary | ICD-10-CM | POA: Diagnosis not present

## 2018-02-04 DIAGNOSIS — E7800 Pure hypercholesterolemia, unspecified: Secondary | ICD-10-CM | POA: Insufficient documentation

## 2018-02-04 LAB — CMP (CANCER CENTER ONLY)
ALBUMIN: 3.8 g/dL (ref 3.5–5.0)
ALK PHOS: 108 U/L (ref 40–150)
ALT: 15 U/L (ref 0–55)
AST: 24 U/L (ref 5–34)
Anion gap: 11 (ref 3–11)
BUN: 29 mg/dL — ABNORMAL HIGH (ref 7–26)
CALCIUM: 9.8 mg/dL (ref 8.4–10.4)
CHLORIDE: 111 mmol/L — AB (ref 98–109)
CO2: 21 mmol/L — AB (ref 22–29)
CREATININE: 2.53 mg/dL — AB (ref 0.60–1.10)
GFR, Est AFR Am: 21 mL/min — ABNORMAL LOW (ref >60)
GFR, Estimated: 18 mL/min — ABNORMAL LOW (ref >60)
GLUCOSE: 94 mg/dL (ref 70–140)
Potassium: 4.3 mmol/L (ref 3.5–5.1)
SODIUM: 143 mmol/L (ref 136–145)
Total Bilirubin: 0.5 mg/dL (ref 0.2–1.2)
Total Protein: 7.3 g/dL (ref 6.4–8.3)

## 2018-02-04 LAB — CBC WITH DIFFERENTIAL (CANCER CENTER ONLY)
BASOS PCT: 1 %
Basophils Absolute: 0.1 10*3/uL (ref 0.0–0.1)
Eosinophils Absolute: 0.2 10*3/uL (ref 0.0–0.5)
Eosinophils Relative: 2 %
HEMATOCRIT: 39 % (ref 34.8–46.6)
Hemoglobin: 12.7 g/dL (ref 11.6–15.9)
LYMPHS ABS: 3.4 10*3/uL — AB (ref 0.9–3.3)
Lymphocytes Relative: 38 %
MCH: 30.2 pg (ref 25.1–34.0)
MCHC: 32.6 g/dL (ref 31.5–36.0)
MCV: 92.8 fL (ref 79.5–101.0)
MONO ABS: 0.6 10*3/uL (ref 0.1–0.9)
MONOS PCT: 7 %
Neutro Abs: 4.6 10*3/uL (ref 1.5–6.5)
Neutrophils Relative %: 52 %
Platelet Count: 350 10*3/uL (ref 145–400)
RBC: 4.2 MIL/uL (ref 3.70–5.45)
RDW: 13.4 % (ref 11.2–14.5)
WBC Count: 8.8 10*3/uL (ref 3.9–10.3)

## 2018-02-04 NOTE — Telephone Encounter (Signed)
Scheduled appt per 4/2 los - Gave patient Avs and calender per los. Central radiology to contact patient with ct scan .

## 2018-02-04 NOTE — Progress Notes (Signed)
[facility] [phone]   Fax:([phone]  OFFICE PROGRESS NOTE  Josetta Huddle, MD [address] [place] [address]  DIAGNOSIS: Non-small cell carcinoma of right lung, stage 1  Staging form: Lung, AJCC 7th Edition  Clinical: Stage IB (T2a, N0, M0) - Unsigned  PRIOR THERAPY:  1) Status post right upper thorascoscopic lobectomy on 04/29/2015 under the care of Dr. Roxan Hockey. 2) Adjuvant chemotherapy with carboplatin AUC 5 given on day 1 and gemcitabine 1000 mg/m2 given on days 1 and 8 every 3 weeks. Status post 4 cycles, last dose was given 09/07/2015.  CURRENT THERAPY: Observation.  INTERVAL HISTORY: Betty [name] 72 y.o. female returns to the clinic today for follow-up visit.  The patient was last seen in May 2017.  She missed several of her appointment.  Her last scan was done by Dr. Roxan Hockey in August 2018 and that showed no evidence for disease progression.  She is feeling fine today with no specific complaints.  She denied having any chest pain, shortness of breath, cough or hemoptysis.  She denied having any fever or chills.  Patient denied having any nausea, vomiting, diarrhea or constipation.  She is here today for reevaluation of her condition.   MEDICAL HISTORY: Past Medical History:  Diagnosis Date  . Acute mastitis of right breast    10/2003  . Arthritis    bursitis in right shoulder  . Chronic kidney disease    CKD stage 3 04/28/15  . Colon polyps   . 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 Family history of breast cancer   . GERD (gastroesophageal reflux disease)   . Hypercholesterolemia   . Hypertension   . NSCL ca dx'd 03/2015   chemo; rllobectomy  . Sciatica of right side   . Seasonal allergic rhinitis   . Tobacco dependence   . Tubular adenoma of colon   . Vitamin D deficiency     ALLERGIES:  is allergic to sulfa antibiotics.  MEDICATIONS:  Current Outpatient  Medications  Medication Sig Dispense Refill  . acetaminophen (TYLENOL) 325 MG tablet Take 650 mg by mouth every 6 (six) hours as needed. Reported on 03/27/2016    . aspirin 81 MG tablet Take 81 mg by mouth daily.    Marland Kitchen lisinopril-hydrochlorothiazide (PRINZIDE,ZESTORETIC) 20-12.5 MG per tablet Take 2 tablets by mouth daily.     . metoprolol succinate (TOPROL-XL) 50 MG 24 hr tablet Take 50 mg by mouth daily.     . Multiple Vitamins-Minerals (MULTIVITAMIN PO) Take 1 tablet by mouth daily.    . simvastatin (ZOCOR) 20 MG tablet Take 20 mg by mouth 3 (three) times a week. Mon Wed Fri    . loratadine (CLARITIN) 10 MG tablet Take 10 mg by mouth daily as needed.      No current facility-administered medications for this visit.     SURGICAL HISTORY:  Past Surgical History:  Procedure Laterality Date  . BREAST SURGERY     small mass removed from left breast--benign  . CRYO INTERCOSTAL NERVE BLOCK Right 04/29/2015   Procedure: CRYO INTERCOSTAL NERVE BLOCK;  Surgeon: [name], MD;  Location: [facility];  Service: Thoracic;  Laterality: Right;  . LOBECTOMY Right 04/29/2015   Procedure: RIGHT LOWER LUNG LOBECTOMY ;  Surgeon: [name], MD;  Location: [facility];  Service: Thoracic;  Laterality: Right;  . LYMPH NODE DISSECTION Right 04/29/2015   Procedure: RIGHT LUNG LYMPH  NODE DISSECTION;  Surgeon: [name], MD;  Location: [facility];  Service: Thoracic;  Laterality: Right;  Marland Kitchen VAGINAL DELIVERY     51 yrs ago  . VIDEO ASSISTED THORACOSCOPY (VATS)/ LOBECTOMY Right 04/29/2015   Procedure: RIGHT VIDEO ASSISTED THORACOSCOPY (VATS) WEDGE RESECTION/ RIGHT LOWER LOBECTOMY, CRYO-ANALGESIA OF INTERCOSTAL NERVES;  Surgeon: [name], MD;  Location: [facility];  Service: Thoracic;  Laterality: Right;    REVIEW OF SYSTEMS:  A comprehensive review of systems was negative.   PHYSICAL EXAMINATION: General appearance: alert, cooperative and no distress Head: Normocephalic, without obvious  abnormality, atraumatic Neck: no adenopathy, no JVD, supple, symmetrical, trachea midline and thyroid not enlarged, symmetric, no tenderness/mass/nodules Lymph nodes: Cervical, supraclavicular, and axillary nodes normal. Resp: clear to auscultation bilaterally Back: symmetric, no curvature. ROM normal. No CVA tenderness. Cardio: regular rate and rhythm, S1, S2 normal, no murmur, click, rub or gallop GI: soft, non-tender; bowel sounds normal; no masses,  no organomegaly Extremities: extremities normal, atraumatic, no cyanosis or edema  ECOG PERFORMANCE STATUS: 1 - Symptomatic but completely ambulatory  Blood pressure 126/75, pulse 94, temperature 97.7 F (36.5 C), temperature source Oral, resp. rate 18, height 5\' 4"  (1.626 m), weight 144 lb 1.6 oz (65.4 kg), SpO2 100 %.  LABORATORY DATA: Lab Results  Component Value Date   WBC 8.8 02/04/2018   HGB 10.6 (L) 09/18/2016   HCT 39.0 02/04/2018   MCV 92.8 02/04/2018   PLT 350 02/04/2018      Chemistry      Component Value Date/Time   NA 145 09/18/2016 1057   K 4.9 09/18/2016 1057   CL 112 (H) 05/02/2015 0600   CO2 22 09/18/2016 1057   BUN 39.6 (H) 09/18/2016 1057   CREATININE 2.8 (H) 09/18/2016 1057      Component Value Date/Time   CALCIUM 9.8 09/18/2016 1057   ALKPHOS 123 09/18/2016 1057   AST 19 09/18/2016 1057   ALT 12 09/18/2016 1057   BILITOT 0.35 09/18/2016 1057       RADIOGRAPHIC STUDIES: No results found.  ASSESSMENT AND PLAN: This is a very pleasant 72 years old African-American female with a stage IB non-small cell lung cancer status post right lower lobectomy followed by 4 cycles of adjuvant chemotherapy with carboplatin and gemcitabine and tolerated her treatment fairly well. Her last scan in August 2018 showed no concerning findings for disease progression. I recommended for the patient to continue in observation with repeat CT scan of the chest in 6 months.  She will come back for follow-up visit at that  time. She was advised to call immediately if she has any concerning symptoms in the interval. The patient voices understanding of current disease status and treatment options and is in agreement with the current care plan.  All questions were answered. The patient knows to call the clinic with any problems, questions or concerns. We can certainly see the patient much sooner if necessary.  Disclaimer: This note was dictated with voice recognition software. Similar sounding words can inadvertently be transcribed and may not be corrected upon review.

## 2018-02-11 ENCOUNTER — Other Ambulatory Visit: Payer: Self-pay | Admitting: Internal Medicine

## 2018-02-11 ENCOUNTER — Ambulatory Visit
Admission: RE | Admit: 2018-02-11 | Discharge: 2018-02-11 | Disposition: A | Discharge status: Home / Self Care | Payer: Medicare Other | Source: Ambulatory Visit | Attending: Internal Medicine | Admitting: Internal Medicine

## 2018-02-11 DIAGNOSIS — J90 Pleural effusion, not elsewhere classified: Secondary | ICD-10-CM | POA: Diagnosis not present

## 2018-02-11 DIAGNOSIS — R0789 Other chest pain: Secondary | ICD-10-CM | POA: Diagnosis not present

## 2018-02-11 DIAGNOSIS — N184 Chronic kidney disease, stage 4 (severe): Secondary | ICD-10-CM | POA: Diagnosis not present

## 2018-02-11 DIAGNOSIS — C3430 Malignant neoplasm of lower lobe, unspecified bronchus or lung: Secondary | ICD-10-CM | POA: Diagnosis not present

## 2018-02-19 ENCOUNTER — Other Ambulatory Visit: Payer: Self-pay | Admitting: Internal Medicine

## 2018-02-19 ENCOUNTER — Ambulatory Visit
Admission: RE | Admit: 2018-02-19 | Discharge: 2018-02-19 | Disposition: A | Discharge status: Home / Self Care | Payer: Medicare Other | Source: Ambulatory Visit | Attending: Internal Medicine | Admitting: Internal Medicine

## 2018-02-19 DIAGNOSIS — R0600 Dyspnea, unspecified: Secondary | ICD-10-CM | POA: Diagnosis not present

## 2018-02-19 DIAGNOSIS — C3430 Malignant neoplasm of lower lobe, unspecified bronchus or lung: Secondary | ICD-10-CM | POA: Diagnosis not present

## 2018-02-19 DIAGNOSIS — J90 Pleural effusion, not elsewhere classified: Secondary | ICD-10-CM

## 2018-02-19 DIAGNOSIS — Z85118 Personal history of other malignant neoplasm of bronchus and lung: Secondary | ICD-10-CM | POA: Diagnosis not present

## 2018-02-19 DIAGNOSIS — R0789 Other chest pain: Secondary | ICD-10-CM | POA: Diagnosis not present

## 2018-02-19 DIAGNOSIS — R05 Cough: Secondary | ICD-10-CM | POA: Diagnosis not present

## 2018-02-19 DIAGNOSIS — R110 Nausea: Secondary | ICD-10-CM | POA: Diagnosis not present

## 2018-02-19 DIAGNOSIS — N184 Chronic kidney disease, stage 4 (severe): Secondary | ICD-10-CM | POA: Diagnosis not present

## 2018-02-26 ENCOUNTER — Telehealth (HOSPITAL_COMMUNITY): Payer: Self-pay | Admitting: Internal Medicine

## 2018-02-27 ENCOUNTER — Other Ambulatory Visit (HOSPITAL_COMMUNITY): Payer: Self-pay | Admitting: Internal Medicine

## 2018-02-27 DIAGNOSIS — J90 Pleural effusion, not elsewhere classified: Secondary | ICD-10-CM

## 2018-02-27 NOTE — Telephone Encounter (Signed)
User: Cherie Dark A Date/time: 02/26/18 3:48 PM  Comment: Called pt and was unable to lmsg .Marland Kitchenmsg said to "leave remote access code".   Context:  Outcome: No Answer/Busy  Phone number: [phone] Phone Type: Home Phone  Comm. type: Telephone Call type: Outgoing  Contact: [name] Relation to patient: Self

## 2018-03-03 ENCOUNTER — Ambulatory Visit (HOSPITAL_COMMUNITY): Payer: Medicare Other | Attending: Cardiovascular Disease

## 2018-03-03 DIAGNOSIS — J449 Chronic obstructive pulmonary disease, unspecified: Secondary | ICD-10-CM | POA: Insufficient documentation

## 2018-03-03 DIAGNOSIS — J90 Pleural effusion, not elsewhere classified: Secondary | ICD-10-CM | POA: Diagnosis not present

## 2018-03-03 DIAGNOSIS — I509 Heart failure, unspecified: Secondary | ICD-10-CM | POA: Diagnosis present

## 2018-03-03 DIAGNOSIS — M79671 Pain in right foot: Secondary | ICD-10-CM | POA: Diagnosis present

## 2018-03-03 DIAGNOSIS — I129 Hypertensive chronic kidney disease with stage 1 through stage 4 chronic kidney disease, or unspecified chronic kidney disease: Secondary | ICD-10-CM | POA: Diagnosis not present

## 2018-03-03 DIAGNOSIS — I340 Nonrheumatic mitral (valve) insufficiency: Secondary | ICD-10-CM | POA: Insufficient documentation

## 2018-03-03 DIAGNOSIS — N189 Chronic kidney disease, unspecified: Secondary | ICD-10-CM | POA: Insufficient documentation

## 2018-03-05 ENCOUNTER — Other Ambulatory Visit: Payer: Self-pay | Admitting: Internal Medicine

## 2018-03-05 ENCOUNTER — Ambulatory Visit
Admission: RE | Admit: 2018-03-05 | Discharge: 2018-03-05 | Disposition: A | Discharge status: Home / Self Care | Payer: Medicare Other | Source: Ambulatory Visit | Attending: Internal Medicine | Admitting: Internal Medicine

## 2018-03-05 DIAGNOSIS — I509 Heart failure, unspecified: Secondary | ICD-10-CM

## 2018-03-05 DIAGNOSIS — M79671 Pain in right foot: Secondary | ICD-10-CM | POA: Diagnosis not present

## 2018-03-05 DIAGNOSIS — N184 Chronic kidney disease, stage 4 (severe): Secondary | ICD-10-CM | POA: Diagnosis not present

## 2018-03-05 DIAGNOSIS — S99921A Unspecified injury of right foot, initial encounter: Secondary | ICD-10-CM | POA: Diagnosis not present

## 2018-03-05 DIAGNOSIS — J90 Pleural effusion, not elsewhere classified: Secondary | ICD-10-CM | POA: Diagnosis not present

## 2018-03-05 DIAGNOSIS — M7989 Other specified soft tissue disorders: Secondary | ICD-10-CM | POA: Diagnosis not present

## 2018-03-24 ENCOUNTER — Inpatient Hospital Stay (HOSPITAL_COMMUNITY)
Admission: EM | Admit: 2018-03-24 | Discharge: 2018-04-02 | DRG: 280 | Disposition: A | Discharge status: Home Health | Payer: Medicare Other | Attending: Cardiovascular Disease | Admitting: Cardiovascular Disease

## 2018-03-24 ENCOUNTER — Other Ambulatory Visit: Payer: Self-pay

## 2018-03-24 ENCOUNTER — Emergency Department (HOSPITAL_COMMUNITY): Payer: Medicare Other

## 2018-03-24 ENCOUNTER — Encounter (HOSPITAL_COMMUNITY): Payer: Self-pay | Admitting: Cardiovascular Disease

## 2018-03-24 DIAGNOSIS — Z902 Acquired absence of lung [part of]: Secondary | ICD-10-CM

## 2018-03-24 DIAGNOSIS — I130 Hypertensive heart and chronic kidney disease with heart failure and stage 1 through stage 4 chronic kidney disease, or unspecified chronic kidney disease: Secondary | ICD-10-CM | POA: Diagnosis not present

## 2018-03-24 DIAGNOSIS — R7989 Other specified abnormal findings of blood chemistry: Secondary | ICD-10-CM | POA: Diagnosis not present

## 2018-03-24 DIAGNOSIS — R079 Chest pain, unspecified: Secondary | ICD-10-CM | POA: Diagnosis not present

## 2018-03-24 DIAGNOSIS — Z7982 Long term (current) use of aspirin: Secondary | ICD-10-CM

## 2018-03-24 DIAGNOSIS — F0789 Other personality and behavioral disorders due to known physiological condition: Secondary | ICD-10-CM

## 2018-03-24 DIAGNOSIS — F05 Delirium due to known physiological condition: Secondary | ICD-10-CM | POA: Diagnosis not present

## 2018-03-24 DIAGNOSIS — I214 Non-ST elevation (NSTEMI) myocardial infarction: Principal | ICD-10-CM | POA: Diagnosis present

## 2018-03-24 DIAGNOSIS — E7800 Pure hypercholesterolemia, unspecified: Secondary | ICD-10-CM | POA: Diagnosis not present

## 2018-03-24 DIAGNOSIS — I420 Dilated cardiomyopathy: Secondary | ICD-10-CM | POA: Diagnosis present

## 2018-03-24 DIAGNOSIS — I2510 Atherosclerotic heart disease of native coronary artery without angina pectoris: Secondary | ICD-10-CM | POA: Diagnosis present

## 2018-03-24 DIAGNOSIS — I110 Hypertensive heart disease with heart failure: Secondary | ICD-10-CM | POA: Diagnosis not present

## 2018-03-24 DIAGNOSIS — Z87891 Personal history of nicotine dependence: Secondary | ICD-10-CM

## 2018-03-24 DIAGNOSIS — N179 Acute kidney failure, unspecified: Secondary | ICD-10-CM | POA: Diagnosis not present

## 2018-03-24 DIAGNOSIS — Z85118 Personal history of other malignant neoplasm of bronchus and lung: Secondary | ICD-10-CM

## 2018-03-24 DIAGNOSIS — N184 Chronic kidney disease, stage 4 (severe): Secondary | ICD-10-CM | POA: Diagnosis not present

## 2018-03-24 DIAGNOSIS — R778 Other specified abnormalities of plasma proteins: Secondary | ICD-10-CM

## 2018-03-24 DIAGNOSIS — I5041 Acute combined systolic (congestive) and diastolic (congestive) heart failure: Secondary | ICD-10-CM | POA: Diagnosis present

## 2018-03-24 DIAGNOSIS — K219 Gastro-esophageal reflux disease without esophagitis: Secondary | ICD-10-CM | POA: Diagnosis present

## 2018-03-24 DIAGNOSIS — I5043 Acute on chronic combined systolic (congestive) and diastolic (congestive) heart failure: Secondary | ICD-10-CM | POA: Diagnosis not present

## 2018-03-24 DIAGNOSIS — Z8249 Family history of ischemic heart disease and other diseases of the circulatory system: Secondary | ICD-10-CM

## 2018-03-24 DIAGNOSIS — R451 Restlessness and agitation: Secondary | ICD-10-CM | POA: Diagnosis not present

## 2018-03-24 DIAGNOSIS — I509 Heart failure, unspecified: Secondary | ICD-10-CM

## 2018-03-24 DIAGNOSIS — F09 Unspecified mental disorder due to known physiological condition: Secondary | ICD-10-CM

## 2018-03-24 DIAGNOSIS — R748 Abnormal levels of other serum enzymes: Secondary | ICD-10-CM | POA: Diagnosis not present

## 2018-03-24 DIAGNOSIS — Z79899 Other long term (current) drug therapy: Secondary | ICD-10-CM

## 2018-03-24 DIAGNOSIS — F0390 Unspecified dementia without behavioral disturbance: Secondary | ICD-10-CM | POA: Diagnosis present

## 2018-03-24 DIAGNOSIS — I447 Left bundle-branch block, unspecified: Secondary | ICD-10-CM | POA: Diagnosis present

## 2018-03-24 DIAGNOSIS — R0602 Shortness of breath: Secondary | ICD-10-CM | POA: Diagnosis not present

## 2018-03-24 DIAGNOSIS — J449 Chronic obstructive pulmonary disease, unspecified: Secondary | ICD-10-CM | POA: Diagnosis present

## 2018-03-24 DIAGNOSIS — Z5329 Procedure and treatment not carried out because of patient's decision for other reasons: Secondary | ICD-10-CM | POA: Diagnosis not present

## 2018-03-24 DIAGNOSIS — Z9221 Personal history of antineoplastic chemotherapy: Secondary | ICD-10-CM

## 2018-03-24 HISTORY — DX: Non-ST elevation (NSTEMI) myocardial infarction: I21.4

## 2018-03-24 LAB — CBC
HEMATOCRIT: 34 % — AB (ref 36.0–46.0)
HEMOGLOBIN: 11.3 g/dL — AB (ref 12.0–15.0)
MCH: 29.4 pg (ref 26.0–34.0)
MCHC: 33.2 g/dL (ref 30.0–36.0)
MCV: 88.5 fL (ref 78.0–100.0)
Platelets: 358 10*3/uL (ref 150–400)
RBC: 3.84 MIL/uL — AB (ref 3.87–5.11)
RDW: 12.9 % (ref 11.5–15.5)
WBC: 8.8 10*3/uL (ref 4.0–10.5)

## 2018-03-24 LAB — BASIC METABOLIC PANEL
ANION GAP: 12 (ref 5–15)
BUN: 26 mg/dL — ABNORMAL HIGH (ref 6–20)
CHLORIDE: 112 mmol/L — AB (ref 101–111)
CO2: 18 mmol/L — AB (ref 22–32)
Calcium: 9.4 mg/dL (ref 8.9–10.3)
Creatinine, Ser: 2.33 mg/dL — ABNORMAL HIGH (ref 0.44–1.00)
GFR calc non Af Amer: 20 mL/min — ABNORMAL LOW (ref >60)
GFR, EST AFRICAN AMERICAN: 23 mL/min — AB (ref >60)
Glucose, Bld: 159 mg/dL — ABNORMAL HIGH (ref 65–99)
Potassium: 3.8 mmol/L (ref 3.5–5.1)
Sodium: 142 mmol/L (ref 135–145)

## 2018-03-24 LAB — BRAIN NATRIURETIC PEPTIDE

## 2018-03-24 LAB — I-STAT TROPONIN, ED: Troponin i, poc: 0.16 ng/mL (ref 0.00–0.08)

## 2018-03-24 LAB — TROPONIN I: TROPONIN I: 0.19 ng/mL — AB (ref <0.03)

## 2018-03-24 MED ORDER — ASPIRIN EC 81 MG PO TBEC
81.0000 mg | DELAYED_RELEASE_TABLET | Freq: Every day | ORAL | Status: DC
  Administered 2018-03-25 – 2018-04-01 (×9): 81 mg via ORAL
  Filled 2018-03-24 (×9): qty 1

## 2018-03-24 MED ORDER — HEPARIN BOLUS VIA INFUSION
4000.0000 [IU] | Freq: Once | INTRAVENOUS | Status: AC
  Administered 2018-03-24: 4000 [IU] via INTRAVENOUS
  Filled 2018-03-24: qty 4000

## 2018-03-24 MED ORDER — ASPIRIN 81 MG PO CHEW
324.0000 mg | CHEWABLE_TABLET | Freq: Once | ORAL | Status: AC
  Administered 2018-03-24: 324 mg via ORAL
  Filled 2018-03-24: qty 4

## 2018-03-24 MED ORDER — METOPROLOL SUCCINATE ER 100 MG PO TB24
100.0000 mg | ORAL_TABLET | Freq: Every day | ORAL | Status: DC
  Administered 2018-03-24 – 2018-04-01 (×9): 100 mg via ORAL
  Filled 2018-03-24 (×10): qty 1

## 2018-03-24 MED ORDER — ONDANSETRON HCL 4 MG/2ML IJ SOLN
4.0000 mg | Freq: Four times a day (QID) | INTRAMUSCULAR | Status: DC | PRN
  Administered 2018-03-25: 4 mg via INTRAVENOUS
  Filled 2018-03-24: qty 2

## 2018-03-24 MED ORDER — SODIUM CHLORIDE 0.9% FLUSH: 3.0000 mL | INTRAVENOUS | Status: DC | PRN

## 2018-03-24 MED ORDER — HEPARIN (PORCINE) IN NACL 100-0.45 UNIT/ML-% IJ SOLN
950.0000 [IU]/h | INTRAMUSCULAR | Status: DC
  Administered 2018-03-24: 750 [IU]/h via INTRAVENOUS
  Administered 2018-03-26 – 2018-03-27 (×2): 850 [IU]/h via INTRAVENOUS
  Filled 2018-03-24 (×4): qty 250

## 2018-03-24 MED ORDER — FUROSEMIDE 40 MG PO TABS: 40.0000 mg | ORAL_TABLET | Freq: Every day | ORAL | Status: DC

## 2018-03-24 MED ORDER — ADULT MULTIVITAMIN W/MINERALS CH
0.5000 | ORAL_TABLET | Freq: Every day | ORAL | Status: DC
  Administered 2018-03-24 – 2018-04-01 (×9): 0.5 via ORAL
  Filled 2018-03-24 (×9): qty 1

## 2018-03-24 MED ORDER — SODIUM CHLORIDE 0.9% FLUSH
3.0000 mL | Freq: Two times a day (BID) | INTRAVENOUS | Status: DC
  Administered 2018-03-24 – 2018-04-02 (×10): 3 mL via INTRAVENOUS

## 2018-03-24 MED ORDER — SODIUM CHLORIDE 0.9 % IV SOLN: 250.0000 mL | INTRAVENOUS | Status: DC | PRN

## 2018-03-24 MED ORDER — ACETAMINOPHEN 325 MG PO TABS: 650.0000 mg | ORAL_TABLET | ORAL | Status: DC | PRN

## 2018-03-24 MED ORDER — ATORVASTATIN CALCIUM 40 MG PO TABS
40.0000 mg | ORAL_TABLET | Freq: Every day | ORAL | Status: DC
  Administered 2018-03-25 – 2018-04-01 (×8): 40 mg via ORAL
  Filled 2018-03-24 (×9): qty 1

## 2018-03-24 NOTE — H&P (Signed)
Cardiology Admission History and Physical:   Patient ID: Betty [name]; MRN: [id number]; DOB: August 07, 1946   Admission date: 03/24/2018  Primary Care Provider: Josetta Huddle, MD Primary Cardiologist: Skeet Latch, MD   Chief Complaint:  Chest pain, heart failure  Patient Profile:   Betty [name] is a 72 y.o. female with a history of lung cancer s/p chemo (carboplatin and gemcitabine) and lobectomy, prior tobacco abuse, CKD IV, COPD, hypertension and hyperlipidemia here with acute systolic and diastolic heart failure and NSTEMI.   History of Present Illness:   Betty [name] reports increased shortness of breath for over a month.  She saw her PCP who referred her for an echo 03/03/18 that revealed LVEF 15% with diffuse hypokinesis and inferior akinesis.  Her son is a patient of Dr. Tamala Julian and they requested she be seen by him.  However, they never heard about an initial appointment.  Her son notes that she has been progressively dyspneic over the last month.  She gets short of breath walking short distances around the house. For the last two days she also notes mild chest pressure.  The pressure is constant and doesn't change with exertion.  She hasn't noted any lower extremity edema and denies orthopnea or PND.  She has no prior cardiac history.  She quit smoking 2 years ago.  She reports one fall 2 weeks ago and is unclear what happened.  However there was no preceding chest pain, palpitations, or dizziness.    Past Medical History:  Diagnosis Date  . Acute mastitis of right breast    10/2003  . Arthritis    bursitis in right shoulder  . Chronic kidney disease    CKD stage 3 04/28/15  . Colon polyps   . 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 Family history of breast cancer   . GERD (gastroesophageal reflux disease)   . Hypercholesterolemia   . Hypertension   . NSCL ca dx'd 03/2015   chemo; rllobectomy  . Sciatica of right side   . Seasonal allergic rhinitis   .  Tobacco dependence   . Tubular adenoma of colon   . Vitamin D deficiency     Past Surgical History:  Procedure Laterality Date  . BREAST SURGERY     small mass removed from left breast--benign  . CRYO INTERCOSTAL NERVE BLOCK Right 04/29/2015   Procedure: CRYO INTERCOSTAL NERVE BLOCK;  Surgeon: [name], MD;  Location: [place];  Service: Thoracic;  Laterality: Right;  . LOBECTOMY Right 04/29/2015   Procedure: RIGHT LOWER LUNG LOBECTOMY ;  Surgeon: [name], MD;  Location: [place];  Service: Thoracic;  Laterality: Right;  . LYMPH NODE DISSECTION Right 04/29/2015   Procedure: RIGHT LUNG LYMPH NODE DISSECTION;  Surgeon: [name], MD;  Location: [place];  Service: Thoracic;  Laterality: Right;  Marland Kitchen VAGINAL DELIVERY     51 yrs ago  . VIDEO ASSISTED THORACOSCOPY (VATS)/ LOBECTOMY Right 04/29/2015   Procedure: RIGHT VIDEO ASSISTED THORACOSCOPY (VATS) WEDGE RESECTION/ RIGHT LOWER LOBECTOMY, CRYO-ANALGESIA OF INTERCOSTAL NERVES;  Surgeon: [name], MD;  Location: [place];  Service: Thoracic;  Laterality: Right;     Medications Prior to Admission: Prior to Admission medications   Medication Sig Start Date End Date Taking? Authorizing Provider  acetaminophen (TYLENOL) 325 MG tablet Take 650 mg by mouth every 6 (six) hours as needed for headache (pain). Reported on 03/27/2016   Yes [provider]  aspirin EC 81  MG tablet Take 81 mg by mouth at bedtime.   Yes [provider]  Cholecalciferol (VITAMIN D3 PO) Take 1 capsule by mouth at bedtime.   Yes [provider]  metoprolol succinate (TOPROL-XL) 100 MG 24 hr tablet Take 100 mg by mouth at bedtime. 02/14/18  Yes [provider]  Multiple Vitamin (MULTIVITAMIN WITH MINERALS) TABS tablet Take 0.5 tablets by mouth at bedtime.   Yes [provider]  furosemide (LASIX) 40 MG tablet Take 40 mg by mouth at bedtime. 02/20/18   [provider]     Allergies:    Allergies   Allergen Reactions  . Sulfa Antibiotics Itching    Social History:   Social History   Socioeconomic History  . Marital status: Single    Spouse name: Not on file  . Number of children: Not on file  . Years of education: Not on file  . Highest education level: Not on file  Occupational History  . Not on file  Social Needs  . Financial resource strain: Not on file  . Food insecurity:    Worry: Not on file    Inability: Not on file  . Transportation needs:    Medical: Not on file    Non-medical: Not on file  Tobacco Use  . Smoking status: Former Smoker    Packs/day: 0.75    Years: 47.00    Pack years: 35.25    Types: Cigarettes    Last attempt to quit: 04/29/2015    Years since quitting: 2.9  . Smokeless tobacco: Never Used  . Tobacco comment: no cigarettes since surgery  Substance and Sexual Activity  . Alcohol use: Yes    Alcohol/week: 0.0 oz    Comment: rare for special occasions  . Drug use: No  . Sexual activity: Not on file  Lifestyle  . Physical activity:    Days per week: Not on file    Minutes per session: Not on file  . Stress: Not on file  Relationships  . Social connections:    Talks on phone: Not on file    Gets together: Not on file    Attends religious service: Not on file    Active member of club or organization: Not on file    Attends meetings of clubs or organizations: Not on file    Relationship status: Not on file  . Intimate partner violence:    Fear of current or ex partner: Not on file    Emotionally abused: Not on file    Physically abused: Not on file    Forced sexual activity: Not on file  Other Topics Concern  . Not on file  Social History Narrative  . Not on file    Family History:   The patient's family history includes Cancer in her father and sister; Heart attack in her brother; Other in her son.    ROS:  Please see the history of present illness.  All other ROS reviewed and negative.     Physical Exam/Data:   Vitals:    03/24/18 1630 03/24/18 1700 03/24/18 1730 03/24/18 1815  BP: (!) 141/84 127/87 (!) 114/98 132/80  Pulse: (!) 109 (!) 104 (!) 104 (!) 102  Resp: (!) 31 (!) 22 (!) 33 18  Temp:      TempSrc:      SpO2: 95% 99% 93% 92%   VS:  BP 132/80   Pulse (!) 102   Temp 98.1 F (36.7 C) (Oral)   Resp  18   SpO2 92%  , BMI There is no height or weight on file to calculate BMI. GENERAL:  Well appearing.  No acute distress HEENT: Pupils equal round and reactive, fundi not visualized, oral mucosa unremarkable NECK:  JVD 2cm above clavicle at 45 degrees. Waveform within normal limits, carotid upstroke brisk and symmetric, no bruits LUNGS:  Faint crackles at R base. HEART:  RRR.  PMI not displaced or sustained,S1 and S2 within normal limits, no S3, no S4, no clicks, no rubs, no murmurs ABD:  Flat, positive bowel sounds normal in frequency in pitch, no bruits, no rebound, no guarding, no midline pulsatile mass, no hepatomegaly, no splenomegaly EXT:  2 plus pulses throughout, no edema, no cyanosis no clubbing SKIN:  No rashes no nodules NEURO:  Cranial nerves II through XII grossly intact, motor grossly intact throughout PSYCH:  Cognitively intact, oriented to person place and time   EKG:  The ECG that was done 03/24/18 was personally reviewed and demonstrates sinus  tachycardia.  Rate 109 bpm.  LBBB.  LAD  Relevant CV Studies: Echo 4/ Study Conclusions  - Left ventricle: LVEF is severely depressed at approximately 15%   with diffuse hypokinesis; inferior akiensis. The cavity size was   normal. Wall thickness was increased in a pattern of mild LVH. - Mitral valve: There was mild regurgitation. - Pericardium, extracardiac: A trivial pericardial effusion was   identified.  Laboratory Data:  Chemistry Recent Labs  Lab 03/24/18 1552  NA 142  K 3.8  CL 112*  CO2 18*  GLUCOSE 159*  BUN 26*  CREATININE 2.33*  CALCIUM 9.4  GFRNONAA 20*  GFRAA 23*  ANIONGAP 12    No results for input(s):  PROT, ALBUMIN, AST, ALT, ALKPHOS, BILITOT in the last 168 hours. Hematology Recent Labs  Lab 03/24/18 1552  WBC 8.8  RBC 3.84*  HGB 11.3*  HCT 34.0*  MCV 88.5  MCH 29.4  MCHC 33.2  RDW 12.9  PLT 358   Cardiac EnzymesNo results for input(s): TROPONINI in the last 168 hours.  Recent Labs  Lab 03/24/18 1617  TROPIPOC 0.16*    BNP Recent Labs  Lab 03/24/18 1552  BNP >4,500.0*    DDimer No results for input(s): DDIMER in the last 168 hours.  Radiology/Studies:  Dg Chest 2 View  Result Date: 03/24/2018 CLINICAL DATA:  Chest pain. EXAM: CHEST - 2 VIEW COMPARISON:  03/05/2018 FINDINGS: There is a small right pleural effusion. There is bilateral mild interstitial thickening. There is no left pleural effusion. There is no pneumothorax. There is no focal consolidation. The heart and mediastinal contours are unremarkable. The osseous structures are unremarkable. IMPRESSION: Small right pleural effusion which is increased in size compared with the prior exam. Electronically Signed   By: Kathreen Devoid   On: 03/24/2018 16:40    Assessment and Plan:   # Acute systolic and diastolic heart failure:  # Hypertensive heart disease: # NSTEMI: # LBBB: Newly diagnosed systolic dysfunction with inferior akinesis.  Ms. Hege has a new LBBB and global hypokinesis with focal wall motion abnormality.  Troponin is mildly eelvated but she has chest pain.  We will continue to trend.  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  Given her CKD IV, she runs a higher risk of worsening renal function with cath.  Risks and benefits were discussed and she would [name] to proceed with cath.  She is very mildly volume overloaded but we will hold off on diuresis given that  she will go for cath tomorrow.  Continue aspirin and metoprolol.  We will check fasting lipids and start atorvastatin 40mg .  BNP trending.  Start heparin.   Risks and benefits of cardiac catheterization have been discussed with the patient.  The  patient understands that risks included but are not limited to stroke (1 in 1000), death (1 in 92), kidney failure [usually temporary] (1 in 500), bleeding (1 in 200), allergic reaction [possibly serious] (1 in 200). The patient understands and agrees to proceed.   Chloe Miyoshi C. Oval Linsey, MD, [facility]  03/24/2018 6:34 PM     Severity of Illness: The appropriate patient status for this patient is INPATIENT. Inpatient status is judged to be reasonable and necessary in order to provide the required intensity of service to ensure the patient's safety. The patient's presenting symptoms, physical exam findings, and initial radiographic and laboratory data in the context of their chronic comorbidities is felt to place them at high risk for further clinical deterioration. Furthermore, it is not anticipated that the patient will be medically stable for discharge from the hospital within 2 midnights of admission. The following factors support the patient status of inpatient.   " The patient's presenting symptoms include heart failure, chest pain. " The worrisome physical exam findings include crackles. " The initial radiographic and laboratory data are worrisome because of heart attack. " The chronic co-morbidities include chronic kidney disease.   * I certify that at the point of admission it is my clinical judgment that the patient will require inpatient hospital care spanning beyond 2 midnights from the point of admission due to high intensity of service, high risk for further deterioration and high frequency of surveillance required.*    For questions or updates, please contact [facility] [place] Please consult [email or web address] for contact info under Cardiology/STEMI.    Signed, Skeet Latch, MD  03/24/2018 6:29 PM

## 2018-03-24 NOTE — ED Notes (Signed)
Attempted to call report

## 2018-03-24 NOTE — ED Notes (Signed)
Main lab to add on BNP 

## 2018-03-24 NOTE — ED Triage Notes (Signed)
Pt presents to ED for assessment of 1/10 pressure in her chest with SOB, light-headedness, and dizziness since this morning.  Patient noted to be tachypnic and in a-fib in triage.  EDP states to prioritize

## 2018-03-24 NOTE — ED Provider Notes (Signed)
Betty [name]   CSN: [id number] Arrival date & time: 03/24/18  1527     History   Chief Complaint Chief Complaint  Patient presents with  . Chest Pain    HPI [name] is a 72 y.o. female.  The history is provided by the patient and a relative.  Shortness of Breath  This is a new problem. The average episode lasts 3 weeks. The problem occurs continuously.The current episode started more than 1 week ago. The problem has been gradually worsening. Pertinent negatives include no fever, no sore throat, no ear pain, no cough, no sputum production, no chest pain, no vomiting, no abdominal pain, no rash, no leg pain and no leg swelling. It is unknown what precipitated the problem. She has tried nothing for the symptoms. The treatment provided no relief. Associated medical issues include COPD and heart failure (new diagnosis).    Past Medical History:  Diagnosis Date  . Acute mastitis of right breast    10/2003  . Arthritis    bursitis in right shoulder  . Chronic kidney disease    CKD stage 3 04/28/15  . Colon polyps   . 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 Family history of breast cancer   . GERD (gastroesophageal reflux disease)   . Hypercholesterolemia   . Hypertension   . NSCL ca dx'd 03/2015   chemo; rllobectomy  . Sciatica of right side   . Seasonal allergic rhinitis   . Tobacco dependence   . Tubular adenoma of colon   . Vitamin D deficiency     Patient Active Problem List   Diagnosis Date Noted  . Non-small cell carcinoma of right lung, stage 1 (Betty [name]) 05/17/2015  . S/P lobectomy of lung 04/29/2015    Past Surgical History:  Procedure Laterality Date  . BREAST SURGERY     small mass removed from left breast--benign  . CRYO INTERCOSTAL NERVE BLOCK Right 04/29/2015   Procedure: CRYO INTERCOSTAL NERVE BLOCK;  Surgeon: [name], MD;  Location: [place];  Service: Thoracic;   Laterality: Right;  . LOBECTOMY Right 04/29/2015   Procedure: RIGHT LOWER LUNG LOBECTOMY ;  Surgeon: [name], MD;  Location: [place];  Service: Thoracic;  Laterality: Right;  . LYMPH NODE DISSECTION Right 04/29/2015   Procedure: RIGHT LUNG LYMPH NODE DISSECTION;  Surgeon: [name], MD;  Location: [place];  Service: Thoracic;  Laterality: Right;  Marland Kitchen VAGINAL DELIVERY     51 yrs ago  . VIDEO ASSISTED THORACOSCOPY (VATS)/ LOBECTOMY Right 04/29/2015   Procedure: RIGHT VIDEO ASSISTED THORACOSCOPY (VATS) WEDGE RESECTION/ RIGHT LOWER LOBECTOMY, CRYO-ANALGESIA OF INTERCOSTAL NERVES;  Surgeon: [name], MD;  Location: [place];  Service: Thoracic;  Laterality: Right;     OB History   None      Home Medications    Prior to Admission medications   Medication Sig Start Date End Date Taking? Authorizing Provider  acetaminophen (TYLENOL) 325 MG tablet Take 650 mg by mouth every 6 (six) hours as needed. Reported on 03/27/2016    [provider]  aspirin 81 MG tablet Take 81 mg by mouth daily.    [provider]  lisinopril-hydrochlorothiazide (PRINZIDE,ZESTORETIC) 20-12.5 MG per tablet Take 2 tablets by mouth daily.  03/31/15   [provider]  loratadine (CLARITIN) 10 MG tablet Take 10 mg by mouth daily as needed.     [provider]  metoprolol  succinate (TOPROL-XL) 50 MG 24 hr tablet Take 50 mg by mouth daily.  01/24/15   [provider]  Multiple Vitamins-Minerals (MULTIVITAMIN PO) Take 1 tablet by mouth daily.    [provider]  simvastatin (ZOCOR) 20 MG tablet Take 20 mg by mouth 3 (three) times a week. Mon Wed Fri    [provider]    Family History Family History  Problem Relation Age of Onset  . Cancer Father   . Cancer Sister     Social History Social History   Tobacco Use  . Smoking status: Former Smoker    Packs/day: 0.75    Years: 47.00    Pack years: 35.25    Types: Cigarettes    Last  attempt to quit: 04/29/2015    Years since quitting: 2.9  . Smokeless tobacco: Never Used  . Tobacco comment: no cigarettes since surgery  Substance Use Topics  . Alcohol use: Yes    Alcohol/week: 0.0 oz    Comment: rare for special occasions  . Drug use: No     Allergies   Sulfa antibiotics   Review of Systems Review of Systems  Constitutional: Negative for chills and fever.  HENT: Negative for ear pain and sore throat.   Eyes: Negative for pain and visual disturbance.  Respiratory: Positive for shortness of breath. Negative for cough and sputum production.   Cardiovascular: Positive for palpitations. Negative for chest pain and leg swelling.  Gastrointestinal: Negative for abdominal pain and vomiting.  Genitourinary: Negative for dysuria and hematuria.  Musculoskeletal: Negative for arthralgias and back pain.  Skin: Negative for color change and rash.  Neurological: Negative for seizures and syncope.  All other systems reviewed and are negative.    Physical Exam Updated Vital Signs BP 127/87   Pulse (!) 104   Temp 98.1 F (36.7 C) (Oral)   Resp (!) 22   SpO2 99%   Physical Exam  Constitutional: She appears well-developed and well-nourished. No distress.  HENT:  Head: Normocephalic and atraumatic.  Eyes: Conjunctivae and EOM are normal.  Neck: Neck supple.  Cardiovascular: Regular rhythm, intact distal pulses and normal pulses. Tachycardia present. Exam reveals no S3.  No murmur heard. Pulmonary/Chest: Effort normal and breath sounds normal. No respiratory distress. She has no decreased breath sounds.  Abdominal: Soft. There is no tenderness.  Musculoskeletal: She exhibits no edema.       Right lower leg: She exhibits no tenderness and no edema.       Left lower leg: She exhibits no tenderness and no edema.  Neurological: She is alert.  Skin: Skin is warm and dry.  Psychiatric: She has a normal mood and affect.  Nursing [name] and vitals reviewed.    ED  Treatments / Results  Labs (all labs ordered are listed, but only abnormal results are displayed) Labs Reviewed  BASIC METABOLIC PANEL - Abnormal; Notable for the following components:      Result Value   Chloride 112 (*)    CO2 18 (*)    Glucose, Bld 159 (*)    BUN 26 (*)    Creatinine, Ser 2.33 (*)    GFR calc non Af Amer 20 (*)    GFR calc Af Amer 23 (*)    All other components within normal limits  CBC - Abnormal; Notable for the following components:   RBC 3.84 (*)    Hemoglobin 11.3 (*)    HCT 34.0 (*)    All other components within normal  limits  I-STAT TROPONIN, ED - Abnormal; Notable for the following components:   Troponin i, poc 0.16 (*)    All other components within normal limits  BRAIN NATRIURETIC PEPTIDE    EKG EKG Interpretation  Date/Time:  Monday Mar 24 2018 15:45:37 EDT Ventricular Rate:  109 PR Interval:  132 QRS Duration: 144 QT Interval:  412 QTC Calculation: 554 R Axis:   -48 Text Interpretation:  Sinus tachycardia Possible Left atrial enlargement Left axis deviation Left bundle branch block Abnormal ECG less artifact suspect similar to eazrlier in day, but new LBBB w/ repol changes since june 2016 Confirmed by Merrily Pew [phone]) on 03/24/2018 4:37:44 PM   Radiology Dg Chest 2 View  Result Date: 03/24/2018 CLINICAL DATA:  Chest pain. EXAM: CHEST - 2 VIEW COMPARISON:  03/05/2018 FINDINGS: There is a small right pleural effusion. There is bilateral mild interstitial thickening. There is no left pleural effusion. There is no pneumothorax. There is no focal consolidation. The heart and mediastinal contours are unremarkable. The osseous structures are unremarkable. IMPRESSION: Small right pleural effusion which is increased in size compared with the prior exam. Electronically Signed   By: Kathreen Devoid   On: 03/24/2018 16:40    Procedures Procedures (including critical care time)  Medications Ordered in ED Medications  aspirin chewable tablet 324 mg  (has no administration in time range)     Initial Impression / Assessment and Plan / ED Course  I have reviewed the triage vital signs and the nursing notes.  Pertinent labs & imaging results that were available during my care of the patient were reviewed by me and considered in my medical decision making (see chart for details).    Patient is a 72 year old female with history as above, notable for hypertension and hyperlipidemia who presents with ongoing dyspnea on exertion for the last 3 weeks.  Reports the problem has been getting worse.  Today, she had palpitations in her chest.  She denies any frank chest pain.  Her primary symptoms are fatigue and dyspnea on exertion.  Her PCP ordered an outpatient echo 3 weeks ago.  This showed new onset heart failure.  Her EF is about 15%.  Here, patient is mildly tachycardic.  She is overall well-appearing.  No signs of overt volume overload on exam.  Labs are notable for elevated troponin.  EKG shows new left bundle branch block.  She has been given aspirin.  Cardiology consulted for admission for new onset heart failure and elevated troponin.  She is hemodynamically stable.  Final Clinical Impressions(s) / ED Diagnoses   Final diagnoses:  Elevated troponin  Heart failure, unspecified HF chronicity, unspecified heart failure type [facility])    ED Discharge Orders    None       Clifton James, MD 03/24/18 Radonna Ricker, MD 03/25/18 0007    Merrily Pew, MD 03/25/18 [id number]

## 2018-03-24 NOTE — Progress Notes (Signed)
ANTICOAGULATION CONSULT NOTE - Initial Consult  Pharmacy Consult for heparin Indication: chest pain/ACS  Allergies  Allergen Reactions  . Sulfa Antibiotics Itching    Patient Measurements:   Heparin Dosing Weight: 65 kg  Vital Signs: Temp: 98.1 F (36.7 C) (05/20 1542) Temp Source: Oral (05/20 1542) BP: 140/85 (05/20 1830) Pulse Rate: 110 (05/20 1830)  Labs: Recent Labs    03/24/18 1552  HGB 11.3*  HCT 34.0*  PLT 358  CREATININE 2.33*    CrCl cannot be calculated (Unknown ideal weight.).   Medical History: Past Medical History:  Diagnosis Date  . Acute mastitis of right breast    10/2003  . Arthritis    bursitis in right shoulder  . Chronic kidney disease    CKD stage 3 04/28/15  . Colon polyps   . 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 Family history of breast cancer   . GERD (gastroesophageal reflux disease)   . Hypercholesterolemia   . Hypertension   . NSCL ca dx'd 03/2015   chemo; rllobectomy  . Sciatica of right side   . Seasonal allergic rhinitis   . Tobacco dependence   . Tubular adenoma of colon   . Vitamin D deficiency     Medications:   (Not in a hospital admission)  Assessment: 67 YOF with h/o lung cancer on carboplatin and gemcitabine here with acute CHF and NSTEMI. Pharmacy consulted to start IV heparin. She is not on any anticoagulation prior to admission. H/H low Plt wnl.   Goal of Therapy:  Heparin level 0.3-0.7 units/ml Monitor platelets by anticoagulation protocol: Yes   Plan:  -Heparin 4000 units IV once, then start IV heparin at 750 units/hr  -F/u 8 hr HL -Monitor daily HL, CBC and s/s of bleeding  Albertina Parr, PharmD., BCPS Clinical Pharmacist Clinical phone for 03/24/18 until 11pm: [id number] If after 11pm, please call main pharmacy at: [phone]

## 2018-03-24 NOTE — ED Notes (Signed)
Heparin verified with Lovena Le, RN

## 2018-03-25 ENCOUNTER — Observation Stay (HOSPITAL_COMMUNITY): Payer: Medicare Other

## 2018-03-25 ENCOUNTER — Encounter (HOSPITAL_COMMUNITY)
Admission: EM | Disposition: A | Discharge status: Home Health | Payer: Self-pay | Source: Home / Self Care | Attending: Cardiovascular Disease

## 2018-03-25 DIAGNOSIS — R413 Other amnesia: Secondary | ICD-10-CM | POA: Diagnosis not present

## 2018-03-25 DIAGNOSIS — I5041 Acute combined systolic (congestive) and diastolic (congestive) heart failure: Secondary | ICD-10-CM | POA: Diagnosis not present

## 2018-03-25 DIAGNOSIS — I248 Other forms of acute ischemic heart disease: Secondary | ICD-10-CM | POA: Diagnosis not present

## 2018-03-25 DIAGNOSIS — Z8249 Family history of ischemic heart disease and other diseases of the circulatory system: Secondary | ICD-10-CM | POA: Diagnosis not present

## 2018-03-25 DIAGNOSIS — N184 Chronic kidney disease, stage 4 (severe): Secondary | ICD-10-CM | POA: Diagnosis not present

## 2018-03-25 DIAGNOSIS — Z7982 Long term (current) use of aspirin: Secondary | ICD-10-CM | POA: Diagnosis not present

## 2018-03-25 DIAGNOSIS — F0390 Unspecified dementia without behavioral disturbance: Secondary | ICD-10-CM | POA: Diagnosis present

## 2018-03-25 DIAGNOSIS — Z79899 Other long term (current) drug therapy: Secondary | ICD-10-CM | POA: Diagnosis not present

## 2018-03-25 DIAGNOSIS — I428 Other cardiomyopathies: Secondary | ICD-10-CM | POA: Diagnosis not present

## 2018-03-25 DIAGNOSIS — I5043 Acute on chronic combined systolic (congestive) and diastolic (congestive) heart failure: Secondary | ICD-10-CM | POA: Diagnosis present

## 2018-03-25 DIAGNOSIS — K219 Gastro-esophageal reflux disease without esophagitis: Secondary | ICD-10-CM | POA: Diagnosis present

## 2018-03-25 DIAGNOSIS — Z85118 Personal history of other malignant neoplasm of bronchus and lung: Secondary | ICD-10-CM | POA: Diagnosis not present

## 2018-03-25 DIAGNOSIS — I129 Hypertensive chronic kidney disease with stage 1 through stage 4 chronic kidney disease, or unspecified chronic kidney disease: Secondary | ICD-10-CM | POA: Diagnosis not present

## 2018-03-25 DIAGNOSIS — R748 Abnormal levels of other serum enzymes: Secondary | ICD-10-CM | POA: Diagnosis not present

## 2018-03-25 DIAGNOSIS — Z9221 Personal history of antineoplastic chemotherapy: Secondary | ICD-10-CM | POA: Diagnosis not present

## 2018-03-25 DIAGNOSIS — E7800 Pure hypercholesterolemia, unspecified: Secondary | ICD-10-CM | POA: Diagnosis not present

## 2018-03-25 DIAGNOSIS — I130 Hypertensive heart and chronic kidney disease with heart failure and stage 1 through stage 4 chronic kidney disease, or unspecified chronic kidney disease: Secondary | ICD-10-CM | POA: Diagnosis present

## 2018-03-25 DIAGNOSIS — Z902 Acquired absence of lung [part of]: Secondary | ICD-10-CM | POA: Diagnosis not present

## 2018-03-25 DIAGNOSIS — J449 Chronic obstructive pulmonary disease, unspecified: Secondary | ICD-10-CM | POA: Diagnosis not present

## 2018-03-25 DIAGNOSIS — Z5329 Procedure and treatment not carried out because of patient's decision for other reasons: Secondary | ICD-10-CM | POA: Diagnosis not present

## 2018-03-25 DIAGNOSIS — I214 Non-ST elevation (NSTEMI) myocardial infarction: Secondary | ICD-10-CM | POA: Diagnosis not present

## 2018-03-25 DIAGNOSIS — F05 Delirium due to known physiological condition: Secondary | ICD-10-CM | POA: Diagnosis not present

## 2018-03-25 DIAGNOSIS — R451 Restlessness and agitation: Secondary | ICD-10-CM | POA: Diagnosis not present

## 2018-03-25 DIAGNOSIS — I447 Left bundle-branch block, unspecified: Secondary | ICD-10-CM | POA: Diagnosis present

## 2018-03-25 DIAGNOSIS — F09 Unspecified mental disorder due to known physiological condition: Secondary | ICD-10-CM | POA: Diagnosis not present

## 2018-03-25 DIAGNOSIS — N179 Acute kidney failure, unspecified: Secondary | ICD-10-CM | POA: Diagnosis present

## 2018-03-25 DIAGNOSIS — E785 Hyperlipidemia, unspecified: Secondary | ICD-10-CM | POA: Diagnosis not present

## 2018-03-25 DIAGNOSIS — F0789 Other personality and behavioral disorders due to known physiological condition: Secondary | ICD-10-CM | POA: Diagnosis not present

## 2018-03-25 DIAGNOSIS — I2510 Atherosclerotic heart disease of native coronary artery without angina pectoris: Secondary | ICD-10-CM | POA: Diagnosis present

## 2018-03-25 DIAGNOSIS — I420 Dilated cardiomyopathy: Secondary | ICD-10-CM | POA: Diagnosis present

## 2018-03-25 DIAGNOSIS — Z87891 Personal history of nicotine dependence: Secondary | ICD-10-CM | POA: Diagnosis not present

## 2018-03-25 LAB — PROTIME-INR
INR: 1.17
PROTHROMBIN TIME: 14.8 s (ref 11.4–15.2)

## 2018-03-25 LAB — BASIC METABOLIC PANEL
Anion gap: 13 (ref 5–15)
BUN: 31 mg/dL — AB (ref 6–20)
CHLORIDE: 111 mmol/L (ref 101–111)
CO2: 20 mmol/L — ABNORMAL LOW (ref 22–32)
CREATININE: 2.42 mg/dL — AB (ref 0.44–1.00)
Calcium: 9.4 mg/dL (ref 8.9–10.3)
GFR, EST AFRICAN AMERICAN: 22 mL/min — AB (ref >60)
GFR, EST NON AFRICAN AMERICAN: 19 mL/min — AB (ref >60)
Glucose, Bld: 177 mg/dL — ABNORMAL HIGH (ref 65–99)
POTASSIUM: 4.1 mmol/L (ref 3.5–5.1)
SODIUM: 144 mmol/L (ref 135–145)

## 2018-03-25 LAB — NM MYOCAR MULTI W/SPECT W/WALL MOTION / EF
CHL CUP RESTING HR STRESS: 82 {beats}/min
CSEPED: 0 min
Estimated workload: 1 METS
Exercise duration (sec): 0 s
Peak HR: 90 {beats}/min

## 2018-03-25 LAB — LIPID PANEL
Cholesterol: 175 mg/dL (ref 0–200)
HDL: 39 mg/dL — AB (ref >40)
LDL CALC: 115 mg/dL — AB (ref 0–99)
Total CHOL/HDL Ratio: 4.5 RATIO
Triglycerides: 103 mg/dL (ref <150)
VLDL: 21 mg/dL (ref 0–40)

## 2018-03-25 LAB — CBC
HCT: 32.8 % — ABNORMAL LOW (ref 36.0–46.0)
Hemoglobin: 11 g/dL — ABNORMAL LOW (ref 12.0–15.0)
MCH: 29.8 pg (ref 26.0–34.0)
MCHC: 33.5 g/dL (ref 30.0–36.0)
MCV: 88.9 fL (ref 78.0–100.0)
Platelets: 318 10*3/uL (ref 150–400)
RBC: 3.69 MIL/uL — AB (ref 3.87–5.11)
RDW: 12.9 % (ref 11.5–15.5)
WBC: 8.8 10*3/uL (ref 4.0–10.5)

## 2018-03-25 LAB — TROPONIN I
TROPONIN I: 0.17 ng/mL — AB (ref <0.03)
Troponin I: 0.19 ng/mL (ref <0.03)

## 2018-03-25 LAB — HEPARIN LEVEL (UNFRACTIONATED)
Heparin Unfractionated: <0.1 IU/mL — ABNORMAL LOW (ref 0.30–0.70)
Heparin Unfractionated: 0.8 IU/mL — ABNORMAL HIGH (ref 0.30–0.70)

## 2018-03-25 SURGERY — RIGHT/LEFT HEART CATH AND CORONARY ANGIOGRAPHY
Anesthesia: LOCAL

## 2018-03-25 MED ORDER — SODIUM CHLORIDE 0.9% FLUSH: 3.0000 mL | INTRAVENOUS | Status: DC | PRN

## 2018-03-25 MED ORDER — SODIUM CHLORIDE 0.9 % IV SOLN: 250.0000 mL | INTRAVENOUS | Status: DC | PRN

## 2018-03-25 MED ORDER — ASPIRIN 81 MG PO CHEW
81.0000 mg | CHEWABLE_TABLET | ORAL | Status: AC
  Administered 2018-03-25: 81 mg via ORAL
  Filled 2018-03-25: qty 1

## 2018-03-25 MED ORDER — TECHNETIUM TC 99M TETROFOSMIN IV KIT
10.0000 | PACK | Freq: Once | INTRAVENOUS | Status: AC | PRN
  Administered 2018-03-25: 10 via INTRAVENOUS

## 2018-03-25 MED ORDER — REGADENOSON 0.4 MG/5ML IV SOLN
INTRAVENOUS | Status: AC
  Filled 2018-03-25: qty 5

## 2018-03-25 MED ORDER — TECHNETIUM TC 99M TETROFOSMIN IV KIT
30.0000 | PACK | Freq: Once | INTRAVENOUS | Status: AC | PRN
  Administered 2018-03-25: 30 via INTRAVENOUS

## 2018-03-25 MED ORDER — ASPIRIN 81 MG PO CHEW: 81.0000 mg | CHEWABLE_TABLET | ORAL | Status: AC

## 2018-03-25 MED ORDER — LORAZEPAM 2 MG/ML IJ SOLN
0.5000 mg | INTRAMUSCULAR | Status: DC | PRN
  Administered 2018-03-29: 0.5 mg via INTRAVENOUS
  Filled 2018-03-25: qty 1

## 2018-03-25 MED ORDER — SODIUM CHLORIDE 0.9 % IV SOLN
INTRAVENOUS | Status: DC
  Administered 2018-03-25: 06:00:00 via INTRAVENOUS

## 2018-03-25 MED ORDER — SODIUM CHLORIDE 0.9% FLUSH
3.0000 mL | Freq: Two times a day (BID) | INTRAVENOUS | Status: DC
  Administered 2018-03-25 – 2018-03-27 (×4): 3 mL via INTRAVENOUS

## 2018-03-25 MED ORDER — FUROSEMIDE 10 MG/ML IJ SOLN
80.0000 mg | Freq: Two times a day (BID) | INTRAMUSCULAR | Status: DC
  Administered 2018-03-25 (×2): 80 mg via INTRAVENOUS
  Filled 2018-03-25 (×2): qty 8

## 2018-03-25 MED ORDER — HEPARIN BOLUS VIA INFUSION
2000.0000 [IU] | Freq: Once | INTRAVENOUS | Status: AC
Start: 2018-03-25 — End: 2018-03-25
  Administered 2018-03-25: 2000 [IU] via INTRAVENOUS
  Filled 2018-03-25: qty 2000

## 2018-03-25 MED ORDER — REGADENOSON 0.4 MG/5ML IV SOLN
0.4000 mg | Freq: Once | INTRAVENOUS | Status: AC
  Administered 2018-03-25: 0.4 mg via INTRAVENOUS

## 2018-03-25 NOTE — Progress Notes (Signed)
[place] for heparin Indication: chest pain/ACS  Allergies  Allergen Reactions  . Sulfa Antibiotics Itching    Patient Measurements: Height: 5\' 4"  (162.6 cm) Weight: 136 lb 8 oz (61.9 kg) IBW/kg (Calculated) : 54.7 Heparin Dosing Weight: 65 kg  Vital Signs: Temp: 97.8 F (36.6 C) (05/21 1356) Temp Source: Oral (05/21 1356) BP: 127/110 (05/21 1356) Pulse Rate: 86 (05/21 1356)  Labs: Recent Labs    03/24/18 1552 03/24/18 1927 03/25/18 0216 03/25/18 0726 03/25/18 1406  HGB 11.3*  --  11.0*  --   --   HCT 34.0*  --  32.8*  --   --   PLT 358  --  318  --   --   LABPROT  --   --  14.8  --   --   INR  --   --  1.17  --   --   HEPARINUNFRC  --   --  <0.10*  --  0.80*  CREATININE 2.33*  --  2.42*  --   --   TROPONINI  --  0.19* 0.17* 0.19*  --     Estimated Creatinine Clearance: 17.9 mL/min (A) (by C-G formula based on SCr of 2.42 mg/dL (H)).   Medical History: Past Medical History:  Diagnosis Date  . Acute mastitis of right breast    10/2003  . Arthritis    bursitis in right shoulder  . Chronic kidney disease    CKD stage 3 04/28/15  . Colon polyps   . 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 Family history of breast cancer   . GERD (gastroesophageal reflux disease)   . Hypercholesterolemia   . Hypertension   . NSCL ca dx'd 03/2015   chemo; RL Lobectomy  . Sciatica of right side   . Seasonal allergic rhinitis   . Tobacco dependence   . Tubular adenoma of colon   . Vitamin D deficiency     Medications:  Medications Prior to Admission  Medication Sig Dispense Refill Last Dose  . acetaminophen (TYLENOL) 325 MG tablet Take 650 mg by mouth every 6 (six) hours as needed for headache (pain). Reported on 03/27/2016   several months ago  . aspirin EC 81 MG tablet Take 81 mg by mouth at bedtime.   03/23/2018 at pm  . Cholecalciferol (VITAMIN D3 PO) Take 1 capsule by mouth at bedtime.   03/23/2018 at pm   . metoprolol succinate (TOPROL-XL) 100 MG 24 hr tablet Take 100 mg by mouth at bedtime.  6 03/23/2018 at 2200  . Multiple Vitamin (MULTIVITAMIN WITH MINERALS) TABS tablet Take 0.5 tablets by mouth at bedtime.   03/23/2018 at pm  . furosemide (LASIX) 40 MG tablet Take 40 mg by mouth at bedtime.  1 couple nights ago at pm    Assessment: 75 YOF with h/o lung cancer on carboplatin and gemcitabine here with acute CHF and NSTEMI. She is now s/p nuclear stress test and awaiting a decision for possible cath/intervention.   Heparin level supratherapeutic: 0.80, CBC stable overnight, no overt bleeding reported  Goal of Therapy:  Heparin level 0.3-0.7 units/ml Monitor platelets by anticoagulation protocol: Yes   Plan:  -Decrease heparin level to 850 units/hr  -F/u 8 hour heparin level -Monitor daily heparin level, CBC and s/s of bleeding  Thanks for allowing pharmacy to be a part of this patient's care.  Georga Bora, PharmD Clinical Pharmacist 03/25/2018 3:38 PM

## 2018-03-25 NOTE — Progress Notes (Signed)
Progress Note  Patient Name: Betty [name] Date of Encounter: 03/25/2018  Primary Cardiologist: Skeet Latch, MD  Subjective   Denies CP or SOB, thinks she is on [employer] [facility]. Thinks people brought things into the room. Per RN, pt has been like this overnight.  Inpatient Medications    Scheduled Meds: . aspirin EC  81 mg Oral QHS  . atorvastatin  40 mg Oral q1800  . furosemide  40 mg Oral QHS  . metoprolol succinate  100 mg Oral QHS  . multivitamin with minerals  0.5 tablet Oral QHS  . sodium chloride flush  3 mL Intravenous Q12H  . sodium chloride flush  3 mL Intravenous Q12H   Continuous Infusions: . sodium chloride    . sodium chloride    . sodium chloride 10 mL/hr at 03/25/18 0544  . heparin 1,000 Units/hr (03/25/18 0510)   PRN Meds: sodium chloride, sodium chloride, acetaminophen, ondansetron (ZOFRAN) IV, sodium chloride flush, sodium chloride flush   Vital Signs    Vitals:   03/24/18 2005 03/24/18 2157 03/24/18 2243 03/25/18 0500  BP: 139/90 (!) 149/86 (!) 149/86 136/84  Pulse: (!) 104 (!) 115 (!) 110 96  Resp: (!) 25     Temp:  (!) 97.5 F (36.4 C)    TempSrc:  Axillary    SpO2: 100% 93%  97%  Weight:  137 lb 6.4 oz (62.3 kg)  136 lb 8 oz (61.9 kg)  Height:  5\' 4"  (1.626 m)      Intake/Output Summary (Last 24 hours) at 03/25/2018 2694 Last data filed at 03/25/2018 0300 Gross per 24 hour  Intake 480 ml  Output 200 ml  Net 280 ml   Filed Weights   03/24/18 2157 03/25/18 0500  Weight: 137 lb 6.4 oz (62.3 kg) 136 lb 8 oz (61.9 kg)    Telemetry    SR, ST, no sig ectopy - Personally Reviewed  ECG    05/20, ST, HR 109, LBBB is reported old (last ECG in system is 2016, no LBBB then) - Personally Reviewed  Physical Exam   General: Well developed, well nourished, female appearing in no acute distress. Head: Normocephalic, atraumatic.  Neck: Supple without bruits, JVD 8-9 cm. Lungs:  Resp regular and unlabored, decreased BS bases. Heart: RRR, S1,  S2, no S3, S4, or murmur; no rub. Abdomen: Soft, non-tender, non-distended with normoactive bowel sounds. No hepatomegaly. No rebound/guarding. No obvious abdominal masses. Extremities: No clubbing, cyanosis, no edema. Distal pedal pulses are 2+ bilaterally. Neuro: Alert and oriented X 1. Moves all extremities spontaneously. Psych: Normal affect.  Labs    Hematology Recent Labs  Lab 03/24/18 1552 03/25/18 0216  WBC 8.8 8.8  RBC 3.84* 3.69*  HGB 11.3* 11.0*  HCT 34.0* 32.8*  MCV 88.5 88.9  MCH 29.4 29.8  MCHC 33.2 33.5  RDW 12.9 12.9  PLT 358 318    Chemistry Recent Labs  Lab 03/24/18 1552 03/25/18 0216  NA 142 144  K 3.8 4.1  CL 112* 111  CO2 18* 20*  GLUCOSE 159* 177*  BUN 26* 31*  CREATININE 2.33* 2.42*  CALCIUM 9.4 9.4  GFRNONAA 20* 19*  GFRAA 23* 22*  ANIONGAP 12 13     Cardiac Enzymes Recent Labs  Lab 03/24/18 1927 03/25/18 0216  TROPONINI 0.19* 0.17*    Recent Labs  Lab 03/24/18 1617  TROPIPOC 0.16*     BNP Recent Labs  Lab 03/24/18 1552  BNP >4,500.0*    No results found for:  CHOL, HDL, LDLCALC, LDLDIRECT, TRIG, CHOLHDL   Radiology    Dg Chest 2 View  Result Date: 03/24/2018 CLINICAL DATA:  Chest pain. EXAM: CHEST - 2 VIEW COMPARISON:  03/05/2018 FINDINGS: There is a small right pleural effusion. There is bilateral mild interstitial thickening. There is no left pleural effusion. There is no pneumothorax. There is no focal consolidation. The heart and mediastinal contours are unremarkable. The osseous structures are unremarkable. IMPRESSION: Small right pleural effusion which is increased in size compared with the prior exam. Electronically Signed   By: Kathreen Devoid   On: 03/24/2018 16:40     Cardiac Studies   ECHO:  03/03/2018 - Left ventricle: LVEF is severely depressed at approximately 15%   with diffuse hypokinesis; inferior akiensis. The cavity size was   normal. Wall thickness was increased in a pattern of mild LVH. - Mitral  valve: There was mild regurgitation. - Pericardium, extracardiac: A trivial pericardial effusion was   identified.   Patient Profile     72 y.o. female w/ hx lung cancer s/p chemo (carboplatin and gemcitabine) and lobectomy, prior tobacco abuse, CKD IV, COPD, HTN, HLD, LBBB, here with acute systolic and diastolic heart failure (EF 15%) and NSTEMI.   Assessment & Plan     Principal Problem: 1.  NSTEMI (non-ST elevated myocardial infarction) ([place]) - asymptomatic on ASA, high-dose statin and BB - cath planned today, cors only   Active Problems: 2.  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  Pure hypercholesterolemia - started on statin, profile ordered  4.  CKD (chronic kidney disease), stage IV (HCC) - GFR 19 today, in line with previous values - avoid nephrotoxic agents  Jonetta Speak , PA-C 8:08 AM 03/25/2018 Pager: [phone]

## 2018-03-25 NOTE — Progress Notes (Signed)
Spoke w/ Pt's son by phone.  Updated him on MV results and that cath might be needed.  Advised pt should not eat/drink after mn and Dr Oval Linsey will contact him in am regarding plan.  Discussed that she is getting Lasix overnight to get rid of fluid and pt should be breathing better tomorrow.  Rosaria Ferries, PA-C 03/25/2018 4:47 PM Beeper [phone]

## 2018-03-25 NOTE — Progress Notes (Signed)
[facility] for heparin Indication: chest pain/ACS  Allergies  Allergen Reactions  . Sulfa Antibiotics Itching    Patient Measurements: Height: 5\' 4"  (162.6 cm) Weight: 137 lb 6.4 oz (62.3 kg) IBW/kg (Calculated) : 54.7 Heparin Dosing Weight: 65 kg  Vital Signs: Temp: 97.5 F (36.4 C) (05/20 2157) Temp Source: Axillary (05/20 2157) BP: 149/86 (05/20 2243) Pulse Rate: 110 (05/20 2243)  Labs: Recent Labs    03/24/18 1552 03/24/18 1927 03/25/18 0216  HGB 11.3*  --  11.0*  HCT 34.0*  --  32.8*  PLT 358  --  318  LABPROT  --   --  14.8  INR  --   --  1.17  HEPARINUNFRC  --   --  <0.10*  CREATININE 2.33*  --  2.42*  TROPONINI  --  0.19* 0.17*    Estimated Creatinine Clearance: 17.9 mL/min (A) (by C-G formula based on SCr of 2.42 mg/dL (H)).   Medical History: Past Medical History:  Diagnosis Date  . Acute mastitis of right breast    10/2003  . Arthritis    bursitis in right shoulder  . Chronic kidney disease    CKD stage 3 04/28/15  . Colon polyps   . 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 Family history of breast cancer   . GERD (gastroesophageal reflux disease)   . Hypercholesterolemia   . Hypertension   . NSCL ca dx'd 03/2015   chemo; RL Lobectomy  . Sciatica of right side   . Seasonal allergic rhinitis   . Tobacco dependence   . Tubular adenoma of colon   . Vitamin D deficiency     Medications:  Medications Prior to Admission  Medication Sig Dispense Refill Last Dose  . acetaminophen (TYLENOL) 325 MG tablet Take 650 mg by mouth every 6 (six) hours as needed for headache (pain). Reported on 03/27/2016   several months ago  . aspirin EC 81 MG tablet Take 81 mg by mouth at bedtime.   03/23/2018 at pm  . Cholecalciferol (VITAMIN D3 PO) Take 1 capsule by mouth at bedtime.   03/23/2018 at pm  . metoprolol succinate (TOPROL-XL) 100 MG 24 hr tablet Take 100 mg by mouth at bedtime.  6 03/23/2018 at  2200  . Multiple Vitamin (MULTIVITAMIN WITH MINERALS) TABS tablet Take 0.5 tablets by mouth at bedtime.   03/23/2018 at pm  . furosemide (LASIX) 40 MG tablet Take 40 mg by mouth at bedtime.  1 couple nights ago at pm    Assessment: 36 YOF with h/o lung cancer on carboplatin and gemcitabine here with acute CHF and NSTEMI. Pharmacy consulted to start IV heparin. She is not on any anticoagulation prior to admission. H/H low Plt wnl.  Initial heparin level is <0.1 units/ml  No issues with infusion per RN  Goal of Therapy:  Heparin level 0.3-0.7 units/ml Monitor platelets by anticoagulation protocol: Yes   Plan:  -Heparin 2000 units IV, then increase IV heparin to 1000 units/hr  -F/u 6 hr HL -Monitor daily HL, CBC and s/s of bleeding  Thanks for allowing pharmacy to be a part of this patient's care.  Excell Seltzer, PharmD Clinical Pharmacist

## 2018-03-25 NOTE — Progress Notes (Addendum)
   Betty [name] presented for a nuclear stress test today.  No immediate complications.  Stress imaging is pending at this time.  Preliminary EKG findings may be listed in the chart, but the stress test result will not be finalized until perfusion imaging is complete.  One day study, [facility] to read.  Abigail Butts, PA-C 03/25/2018, 12:05 PM

## 2018-03-25 NOTE — Progress Notes (Signed)
   03/25/18 1000  Clinical Encounter Type  Visited With Patient  Visit Type Initial  Referral From Nurse  Consult/Referral To Chaplain  Spiritual Encounters  Spiritual Needs Brochure  Stress Factors  Patient Stress Factors Exhausted  Pt was laying on her bed awake with medical staff wheeling her out for a procedure. No family member on-site and learned that Pt lives alone. Chaplain will revisit to discuss with pt about POA paperwork which had been given earlier before chaplain;s arrival. Pt was very talkertive on her  Way out.  Leith Hedlund a Medical sales representative, [employer]

## 2018-03-26 ENCOUNTER — Encounter (HOSPITAL_COMMUNITY): Payer: Self-pay | Admitting: *Deleted

## 2018-03-26 ENCOUNTER — Other Ambulatory Visit: Payer: Self-pay

## 2018-03-26 LAB — BASIC METABOLIC PANEL
ANION GAP: 13 (ref 5–15)
BUN: 47 mg/dL — ABNORMAL HIGH (ref 6–20)
CO2: 20 mmol/L — ABNORMAL LOW (ref 22–32)
Calcium: 8.8 mg/dL — ABNORMAL LOW (ref 8.9–10.3)
Chloride: 110 mmol/L (ref 101–111)
Creatinine, Ser: 3.08 mg/dL — ABNORMAL HIGH (ref 0.44–1.00)
GFR calc Af Amer: 16 mL/min — ABNORMAL LOW (ref >60)
GFR, EST NON AFRICAN AMERICAN: 14 mL/min — AB (ref >60)
GLUCOSE: 86 mg/dL (ref 65–99)
POTASSIUM: 3.8 mmol/L (ref 3.5–5.1)
SODIUM: 143 mmol/L (ref 135–145)

## 2018-03-26 LAB — CBC
HCT: 29.9 % — ABNORMAL LOW (ref 36.0–46.0)
HEMOGLOBIN: 9.8 g/dL — AB (ref 12.0–15.0)
MCH: 29.7 pg (ref 26.0–34.0)
MCHC: 32.8 g/dL (ref 30.0–36.0)
MCV: 90.6 fL (ref 78.0–100.0)
Platelets: 250 10*3/uL (ref 150–400)
RBC: 3.3 MIL/uL — AB (ref 3.87–5.11)
RDW: 13.1 % (ref 11.5–15.5)
WBC: 10.3 10*3/uL (ref 4.0–10.5)

## 2018-03-26 LAB — HEPARIN LEVEL (UNFRACTIONATED)
HEPARIN UNFRACTIONATED: 0.67 [IU]/mL (ref 0.30–0.70)
HEPARIN UNFRACTIONATED: 0.68 [IU]/mL (ref 0.30–0.70)

## 2018-03-26 NOTE — Plan of Care (Signed)
  Problem: Clinical Measurements: Goal: Will remain free from infection Outcome: Progressing Note:  No s/s of infection noted. Goal: Respiratory complications will improve Outcome: Progressing Note:  No s/s of respiratory complications noted. Goal: Cardiovascular complication will be avoided Outcome: Progressing Note:  No s/s of cardiovascular complications noted.   Problem: Coping: Goal: Level of anxiety will decrease Outcome: Progressing Note:  No s/s of anxiety noted.   Problem: Pain Managment: Goal: General experience of comfort will improve Outcome: Progressing Note:  No s/s of pain or discomfort.

## 2018-03-26 NOTE — Progress Notes (Signed)
Progress Note  Patient Name: Betty [name] Date of Encounter: 03/26/2018  Primary Cardiologist: Skeet Latch, MD  Subjective   Sleepy, rouses to touch, no complaints  Inpatient Medications    Scheduled Meds: . aspirin EC  81 mg Oral QHS  . atorvastatin  40 mg Oral q1800  . furosemide  80 mg Intravenous BID  . metoprolol succinate  100 mg Oral QHS  . multivitamin with minerals  0.5 tablet Oral QHS  . sodium chloride flush  3 mL Intravenous Q12H  . sodium chloride flush  3 mL Intravenous Q12H   Continuous Infusions: . sodium chloride    . sodium chloride    . sodium chloride 10 mL/hr at 03/25/18 0544  . heparin 850 Units/hr (03/26/18 0116)   PRN Meds: sodium chloride, sodium chloride, acetaminophen, LORazepam, ondansetron (ZOFRAN) IV, sodium chloride flush, sodium chloride flush   Vital Signs    Vitals:   03/25/18 1200 03/25/18 1356 03/25/18 2057 03/26/18 0548  BP: 122/80 (!) 127/110 109/75 (!) 93/50  Pulse: 80 86 85 75  Resp:   20 16  Temp:  97.8 F (36.6 C) 97.8 F (36.6 C) 98.3 F (36.8 C)  TempSrc:  Oral  Oral  SpO2:  90% 92% 92%  Weight:    137 lb 9.6 oz (62.4 kg)  Height:        Intake/Output Summary (Last 24 hours) at 03/26/2018 1324 Last data filed at 03/26/2018 0700 Gross per 24 hour  Intake 797.76 ml  Output 900 ml  Net -102.24 ml   Filed Weights   03/24/18 2157 03/25/18 0500 03/26/18 0548  Weight: 137 lb 6.4 oz (62.3 kg) 136 lb 8 oz (61.9 kg) 137 lb 9.6 oz (62.4 kg)    Telemetry    SR, LBBB - Personally Reviewed  ECG    05/20, ST, HR 109, LBBB is reported old (last ECG in system is 2016, no LBBB then) - Personally Reviewed  Physical Exam   General: Well developed, frail, female appearing in no acute distress. Head: Normocephalic, atraumatic.  Neck: Supple without bruits, JVD not elevated Lungs:  Resp regular and unlabored, clear bilaterally Heart: RRR, S1, S2, no S3, S4, or murmur; no rub. Abdomen: Soft, non-tender, non-distended  with normoactive bowel sounds. No hepatomegaly. No obvious abdominal masses. Extremities: No clubbing, cyanosis, no edema. Distal pedal pulses are 2+ bilaterally. Neuro: Sleepy (early am) and oriented X 1. Moves all extremities spontaneously.  Labs    Hematology Recent Labs  Lab 03/24/18 1552 03/25/18 0216 03/26/18 0501  WBC 8.8 8.8 10.3  RBC 3.84* 3.69* 3.30*  HGB 11.3* 11.0* 9.8*  HCT 34.0* 32.8* 29.9*  MCV 88.5 88.9 90.6  MCH 29.4 29.8 29.7  MCHC 33.2 33.5 32.8  RDW 12.9 12.9 13.1  PLT 358 318 250    Chemistry Recent Labs  Lab 03/24/18 1552 03/25/18 0216 03/26/18 0501  NA 142 144 143  K 3.8 4.1 3.8  CL 112* 111 110  CO2 18* 20* 20*  GLUCOSE 159* 177* 86  BUN 26* 31* 47*  CREATININE 2.33* 2.42* 3.08*  CALCIUM 9.4 9.4 8.8*  GFRNONAA 20* 19* 14*  GFRAA 23* 22* 16*  ANIONGAP 12 13 13      Cardiac Enzymes Recent Labs  Lab 03/24/18 1927 03/25/18 0216 03/25/18 0726  TROPONINI 0.19* 0.17* 0.19*    Recent Labs  Lab 03/24/18 1617  TROPIPOC 0.16*     BNP Recent Labs  Lab 03/24/18 1552  BNP >4,500.0*    Lab Results  Component Value Date   CHOL 175 03/25/2018   HDL 39 (L) 03/25/2018   LDLCALC 115 (H) 03/25/2018   TRIG 103 03/25/2018   CHOLHDL 4.5 03/25/2018     Radiology    Dg Chest 2 View  Result Date: 03/24/2018 CLINICAL DATA:  Chest pain. EXAM: CHEST - 2 VIEW COMPARISON:  03/05/2018 FINDINGS: There is a small right pleural effusion. There is bilateral mild interstitial thickening. There is no left pleural effusion. There is no pneumothorax. There is no focal consolidation. The heart and mediastinal contours are unremarkable. The osseous structures are unremarkable. IMPRESSION: Small right pleural effusion which is increased in size compared with the prior exam. Electronically Signed   By: Kathreen Devoid   On: 03/24/2018 16:40   Nm Myocar Multi W/spect W/wall Motion / Ef  Result Date: 03/25/2018  There was no ST segment deviation noted during  stress.  Findings consistent with ischemia.  This is a high risk study.  The left ventricular ejection fraction is severely decreased (<30%).  Severe LVE with diffuse hypokinesis inferior basal dyskinesis EF 17% Poor quality study with stress images having diffusely less counts than rest Suggestion of septal apical distal anterior wall and inferior wall ischemia     Cardiac Studies   ECHO:  03/03/2018 - Left ventricle: LVEF is severely depressed at approximately 15%   with diffuse hypokinesis; inferior akiensis. The cavity size was   normal. Wall thickness was increased in a pattern of mild LVH. - Mitral valve: There was mild regurgitation. - Pericardium, extracardiac: A trivial pericardial effusion was   identified.   Patient Profile     72 y.o. female w/ hx lung cancer s/p chemo (carboplatin and gemcitabine) and lobectomy, prior tobacco abuse, CKD IV, COPD, HTN, HLD, LBBB, here with acute systolic and diastolic heart failure (EF 15%) and NSTEMI.   Assessment & Plan     Principal Problem: 1.  NSTEMI (non-ST elevated myocardial infarction) ([place]) - asymptomatic on ASA, high-dose statin and BB -  MV was high-risk but Cr is higher today - resume diet and discuss plan w/ MD  Active Problems: 2.  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  Pure hypercholesterolemia - started on statin - profile above, LDL 115, goal <70  4.  CKD (chronic kidney disease), stage IV (HCC) - GFR 14 today - d/c Lasix for now - avoid nephrotoxic agents  Signed, Rosaria Ferries , PA-C 8:22 AM 03/26/2018 Pager: [phone]

## 2018-03-26 NOTE — Progress Notes (Addendum)
ANTICOAGULATION CONSULT NOTE - Follow Up Consult  Pharmacy Consult for heparin Indication: NSTEMI  Labs: Recent Labs    03/24/18 1552 03/24/18 1927 03/25/18 0216 03/25/18 0726 03/25/18 1406 03/25/18 2304  HGB 11.3*  --  11.0*  --   --   --   HCT 34.0*  --  32.8*  --   --   --   PLT 358  --  318  --   --   --   LABPROT  --   --  14.8  --   --   --   INR  --   --  1.17  --   --   --   HEPARINUNFRC  --   --  <0.10*  --  0.80* 0.67  CREATININE 2.33*  --  2.42*  --   --   --   TROPONINI  --  0.19* 0.17* 0.19*  --   --     Assessment: 72yo female now undetectable on heparin after two levels at goal; yesterday rate was changed slightly but would not expect such a dramatic drop in level, RN reports line is running well.  Goal of Therapy:  Heparin level 0.3-0.7 units/ml   Plan:  Will increase heparin gtt back to 850 units/hr and check level in 8 hours.    Wynona Neat, PharmD, BCPS  03/26/2018,12:32 AM

## 2018-03-26 NOTE — Care Management Note (Addendum)
Case Management Note  Patient Details  Name: Betty [name] MRN: [id number] Date of Birth: 1946-02-10  Subjective/Objective:              Spoke w patient at the bedside. She states she lives at home alone. She has family calling her everyday and she states that they are available within a few minutes if she needs them. She states she drives herself to pharmacy, grocery store, MD etc. She states that most of the time her son makes her meals and brings them to her otherwise she cooks for herself, she also cleans for herself. She has DME cane at home, states she fell about 3 weeks ago. She states that she does not need cane any longer and ambulates well w/o assistance. She able to navigate her bathroom, denies offer for additional DME. She has never had home health before. Will place PT consult for eval prior to discharge. Do not anticipate any HH needs at this time. Patient states she has no barriers to obtaining medications.       Action/Plan:   Expected Discharge Date:                  Expected Discharge Plan:  [place]  In-House Referral:     Discharge planning Services  CM Consult  Post Acute Care Choice:    Choice offered to:     DME Arranged:    DME Agency:     HH Arranged:    [place] Agency:     Status of Service:  In process, will continue to follow  If discussed at Long Length of Stay Meetings, dates discussed:    Additional Comments:  Carles Collet, RN 03/26/2018, 2:21 PM

## 2018-03-26 NOTE — Progress Notes (Signed)
[place] for heparin Indication: chest pain/ACS  Allergies  Allergen Reactions  . Sulfa Antibiotics Itching   Patient Measurements: Height: 5\' 4"  (162.6 cm) Weight: 137 lb 9.6 oz (62.4 kg) IBW/kg (Calculated) : 54.7 Heparin Dosing Weight: 65 kg  Vital Signs: Temp: 98.3 F (36.8 C) (05/22 0548) Temp Source: Oral (05/22 0548) BP: 93/50 (05/22 0548) Pulse Rate: 75 (05/22 0548)  Labs: Recent Labs    03/24/18 1552 03/24/18 1927  03/25/18 0216 03/25/18 0726 03/25/18 1406 03/25/18 2304 03/26/18 0501  HGB 11.3*  --   --  11.0*  --   --   --  9.8*  HCT 34.0*  --   --  32.8*  --   --   --  29.9*  PLT 358  --   --  318  --   --   --  250  LABPROT  --   --   --  14.8  --   --   --   --   INR  --   --   --  1.17  --   --   --   --   HEPARINUNFRC  --   --    < > <0.10*  --  0.80* 0.67 0.68  CREATININE 2.33*  --   --  2.42*  --   --   --  3.08*  TROPONINI  --  0.19*  --  0.17* 0.19*  --   --   --    < > = values in this interval not displayed.   Estimated Creatinine Clearance: 14 mL/min (A) (by C-G formula based on SCr of 3.08 mg/dL (H)).  Medical History: Past Medical History:  Diagnosis Date  . Acute mastitis of right breast    10/2003  . Arthritis    bursitis in right shoulder  . Chronic kidney disease    CKD stage 3 04/28/15  . Colon polyps   . 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 Family history of breast cancer   . GERD (gastroesophageal reflux disease)   . Hypercholesterolemia   . Hypertension   . NSCL ca dx'd 03/2015   chemo; RL Lobectomy  . Sciatica of right side   . Seasonal allergic rhinitis   . Tobacco dependence   . Tubular adenoma of colon   . Vitamin D deficiency    Medications:  Medications Prior to Admission  Medication Sig Dispense Refill Last Dose  . acetaminophen (TYLENOL) 325 MG tablet Take 650 mg by mouth every 6 (six) hours as needed for headache (pain). Reported on  03/27/2016   several months ago  . aspirin EC 81 MG tablet Take 81 mg by mouth at bedtime.   03/23/2018 at pm  . Cholecalciferol (VITAMIN D3 PO) Take 1 capsule by mouth at bedtime.   03/23/2018 at pm  . metoprolol succinate (TOPROL-XL) 100 MG 24 hr tablet Take 100 mg by mouth at bedtime.  6 03/23/2018 at 2200  . Multiple Vitamin (MULTIVITAMIN WITH MINERALS) TABS tablet Take 0.5 tablets by mouth at bedtime.   03/23/2018 at pm  . furosemide (LASIX) 40 MG tablet Take 40 mg by mouth at bedtime.  1 couple nights ago at pm   Assessment: 21 YOF with h/o lung cancer on carboplatin and gemcitabine here with acute CHF and NSTEMI. She is now s/p nuclear stress test and awaiting a decision for possible cath/intervention. The procedure will be put on hold today  2/2 to worsening renal function.  Heparin level therapeutic twice: 0.68, HgB down slightly from admit, no overt bleeding reported. Will reduce rate slightly given the level is at the upper end of the therapeutic range.   Goal of Therapy:  Heparin level 0.3-0.7 units/ml Monitor platelets by anticoagulation protocol: Yes   Plan:  -Decrease heparin gtt to 800 units/hr  -Monitor daily heparin level, CBC and s/s of bleeding  Thanks for allowing pharmacy to be a part of this patient's care.  Georga Bora, PharmD Clinical Pharmacist 03/26/2018 10:35 AM

## 2018-03-26 NOTE — Progress Notes (Signed)
Patient is more alert tonight. I was able to communicate with patient and ask her about her situation and why she was here.  I was able to let her know the plans for tomorrow, which hopefully she is abl to do her catherization.  She seemed calm and cooperative and was sleeping before entering room.  I will keep monitoring patient.

## 2018-03-27 ENCOUNTER — Encounter (HOSPITAL_COMMUNITY): Payer: Self-pay

## 2018-03-27 LAB — BASIC METABOLIC PANEL
ANION GAP: 11 (ref 5–15)
BUN: 55 mg/dL — AB (ref 6–20)
CHLORIDE: 109 mmol/L (ref 101–111)
CO2: 22 mmol/L (ref 22–32)
Calcium: 9.2 mg/dL (ref 8.9–10.3)
Creatinine, Ser: 3.01 mg/dL — ABNORMAL HIGH (ref 0.44–1.00)
GFR calc Af Amer: 17 mL/min — ABNORMAL LOW (ref >60)
GFR calc non Af Amer: 14 mL/min — ABNORMAL LOW (ref >60)
GLUCOSE: 99 mg/dL (ref 65–99)
POTASSIUM: 3.8 mmol/L (ref 3.5–5.1)
Sodium: 142 mmol/L (ref 135–145)

## 2018-03-27 LAB — CBC
HEMATOCRIT: 30.1 % — AB (ref 36.0–46.0)
Hemoglobin: 10.1 g/dL — ABNORMAL LOW (ref 12.0–15.0)
MCH: 29.9 pg (ref 26.0–34.0)
MCHC: 33.6 g/dL (ref 30.0–36.0)
MCV: 89.1 fL (ref 78.0–100.0)
PLATELETS: 252 10*3/uL (ref 150–400)
RBC: 3.38 MIL/uL — AB (ref 3.87–5.11)
RDW: 12.9 % (ref 11.5–15.5)
WBC: 8.9 10*3/uL (ref 4.0–10.5)

## 2018-03-27 LAB — HEPARIN LEVEL (UNFRACTIONATED)
Heparin Unfractionated: <0.1 IU/mL — ABNORMAL LOW (ref 0.30–0.70)
Heparin Unfractionated: 0.27 IU/mL — ABNORMAL LOW (ref 0.30–0.70)

## 2018-03-27 MED ORDER — SODIUM CHLORIDE 0.9 % IV SOLN
INTRAVENOUS | Status: DC
  Administered 2018-03-27: 10:00:00 via INTRAVENOUS
  Administered 2018-03-27: 75 mL/h via INTRAVENOUS
  Administered 2018-03-28: 13:00:00 via INTRAVENOUS
  Administered 2018-03-29: 75 mL/h via INTRAVENOUS

## 2018-03-27 NOTE — Evaluation (Signed)
Physical Therapy Evaluation Patient Details Name: Betty [name] MRN: [id number] DOB: 1946/06/21 Today's Date: 03/27/2018   History of Present Illness  Pt adm with NSTEMI and acute systolic and diastolic heart failure. PMH - lung ca, copd, dementia, ckd, htn  Clinical Impression  Pt doing well with mobility and no further PT needed.  Ready for dc from PT standpoint.      Follow Up Recommendations No PT follow up    Equipment Recommendations  None recommended by PT    Recommendations for Other Services       Precautions / Restrictions Precautions Precautions: None Restrictions Weight Bearing Restrictions: No      Mobility  Bed Mobility Overal bed mobility: Independent                Transfers Overall transfer level: Independent Equipment used: None                Ambulation/Gait Ambulation/Gait assistance: Independent Ambulation Distance (Feet): 475 Feet Assistive device: None Gait Pattern/deviations: WFL(Within Functional Limits)   Gait velocity interpretation: >2.62 ft/sec, indicative of community ambulatory General Gait Details: Steady gait. Able to perform turns and changes of speed and direction without difficulty.  Stairs            Wheelchair Mobility    Modified Rankin (Stroke Patients Only)       Balance Overall balance assessment: Independent                                           Pertinent Vitals/Pain Pain Assessment: No/denies pain    Home Living Family/patient expects to be discharged to:: Private residence Living Arrangements: Alone Available Help at Discharge: Family;Available PRN/intermittently Type of Home: House       Home Layout: One level Home Equipment: None      Prior Function Level of Independence: Independent         Comments: Reports 1 fall 2 weeks ago of unknown etiology     Hand Dominance        Extremity/Trunk Assessment   Upper Extremity Assessment Upper Extremity  Assessment: Defer to OT evaluation    Lower Extremity Assessment Lower Extremity Assessment: Overall WFL for tasks assessed       Communication   Communication: No difficulties  Cognition Arousal/Alertness: Awake/alert Behavior During Therapy: WFL for tasks assessed/performed Overall Cognitive Status: Within Functional Limits for tasks assessed                                        General Comments      Exercises     Assessment/Plan    PT Assessment Patent does not need any further PT services  PT Problem List         PT Treatment Interventions      PT Goals (Current goals can be found in the Care Plan section)  Acute Rehab PT Goals PT Goal Formulation: All assessment and education complete, DC therapy    Frequency     Barriers to discharge        Co-evaluation               AM-PAC PT "6 Clicks" Daily Activity  Outcome Measure Difficulty turning over in bed (including adjusting bedclothes, sheets and blankets)?: None Difficulty moving from lying on  back to sitting on the side of the bed? : None Difficulty sitting down on and standing up from a chair with arms (e.g., wheelchair, bedside commode, etc,.)?: None Help needed moving to and from a bed to chair (including a wheelchair)?: None Help needed walking in hospital room?: None Help needed climbing 3-5 steps with a railing? : None 6 Click Score: 24    End of Session   Activity Tolerance: Patient tolerated treatment well Patient left: in chair;with call bell/phone within reach Nurse Communication: Mobility status PT Visit Diagnosis: Other abnormalities of gait and mobility (R26.89)    Time: 1216-2446 PT Time Calculation (min) (ACUTE ONLY): 17 min   Charges:   PT Evaluation $PT Eval Low Complexity: 1 Low     PT G CodesMarland Kitchen        [facility] PT [place] 03/27/2018, 11:13 AM

## 2018-03-27 NOTE — Care Management Note (Addendum)
Case Management Note  Patient Details  Name: Betty [name] MRN: [id number] Date of Birth: April 10, 1946  Subjective/Objective: Pt presented for Abnormal Stress Test- increased Cr @ this time. Per MD notes plan for Left Heart Cath once renal function improves. PTA from home alone, however, has support of son. Pt states that son recently purchased her a cane 2/2 recent fall. PT did evaluate no HH needed at this time.                     Action/Plan: CM did discuss [place] RN with the patient and she is refusing services. Pt states her son checks in on her and she will not need. CM did make pt aware to contact PCP if additional needs arise post transition home. No further needs from CM at this time.   Expected Discharge Date:                  Expected Discharge Plan:  [place]  In-House Referral:  NA  Discharge planning Services  CM Consult  Post Acute Care Choice:  Home Health Choice offered to:  Patient, Adult Children  DME Arranged:  N/A DME Agency:  NA  HH Arranged: RN, Disease Management, PT, Social Work [employer] Agency:   [facility] Status of Service:  Completed, signed off  If discussed at [employer], dates discussed:    Additional Comments: 1218 04-02-18 Jacqlyn Krauss, RN,BSN [phone] CM did speak with patient and son Chrissie Noa in regards to home care services. Pt is declining SNF and family will provide 24 hour supervision. CM did make pt aware of personal care agencies that can be looked at on Medicare.Gov. Agency List provided to Chrissie Noa and pt chose [facility] for [place]. Referral sent to [facility] with [facility] and SOC to begin within 24-48 hours post transition home.   [name], RN 03/27/2018, 3:27 PM

## 2018-03-27 NOTE — Progress Notes (Signed)
[place] for heparin Indication: chest pain/ACS  Allergies  Allergen Reactions  . Sulfa Antibiotics Itching   Patient Measurements: Height: 5\' 4"  (162.6 cm) Weight: 136 lb 11.2 oz (62 kg) IBW/kg (Calculated) : 54.7 Heparin Dosing Weight: 65 kg  Vital Signs: Temp: 98 F (36.7 C) (05/23 1320) Temp Source: Oral (05/23 1320) BP: 118/78 (05/23 1320) Pulse Rate: 82 (05/23 1320)  Labs: Recent Labs    03/24/18 1927 03/25/18 0216 03/25/18 0726  03/26/18 0501 03/27/18 0338 03/27/18 1232  HGB  --  11.0*  --   --  9.8* 10.1*  --   HCT  --  32.8*  --   --  29.9* 30.1*  --   PLT  --  318  --   --  250 252  --   LABPROT  --  14.8  --   --   --   --   --   INR  --  1.17  --   --   --   --   --   HEPARINUNFRC  --  <0.10*  --    < > 0.68 <0.10* 0.27*  CREATININE  --  2.42*  --   --  3.08* 3.01*  --   TROPONINI 0.19* 0.17* 0.19*  --   --   --   --    < > = values in this interval not displayed.   Estimated Creatinine Clearance: 14.4 mL/min (A) (by C-G formula based on SCr of 3.01 mg/dL (H)).  Medical History: Past Medical History:  Diagnosis Date  . Acute mastitis of right breast    10/2003  . Arthritis    bursitis in right shoulder  . Chronic kidney disease    CKD stage 3 04/28/15  . Colon polyps   . 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 Family history of breast cancer   . GERD (gastroesophageal reflux disease)   . Hypercholesterolemia   . Hypertension   . NSCL ca dx'd 03/2015   chemo; RL Lobectomy  . Sciatica of right side   . Seasonal allergic rhinitis   . Tobacco dependence   . Tubular adenoma of colon   . Vitamin D deficiency    Medications:  Medications Prior to Admission  Medication Sig Dispense Refill Last Dose  . acetaminophen (TYLENOL) 325 MG tablet Take 650 mg by mouth every 6 (six) hours as needed for headache (pain). Reported on 03/27/2016   several months ago  . aspirin EC 81 MG tablet  Take 81 mg by mouth at bedtime.   03/23/2018 at pm  . Cholecalciferol (VITAMIN D3 PO) Take 1 capsule by mouth at bedtime.   03/23/2018 at pm  . metoprolol succinate (TOPROL-XL) 100 MG 24 hr tablet Take 100 mg by mouth at bedtime.  6 03/23/2018 at 2200  . Multiple Vitamin (MULTIVITAMIN WITH MINERALS) TABS tablet Take 0.5 tablets by mouth at bedtime.   03/23/2018 at pm  . furosemide (LASIX) 40 MG tablet Take 40 mg by mouth at bedtime.  1 couple nights ago at pm   Assessment: 78 YOF with h/o lung cancer on carboplatin and gemcitabine here with acute CHF and NSTEMI. She is now s/p nuclear stress test and awaiting a cath/intervention. Pharmacy is managing her heparin therapy while her cardiac work up is ongoing.  Heparin level is slightly subtherapeutic at 0.27 on an IV rate of 850 units/hr after having been therapeutic x 2.   CBC  is stable and no noted bleeding complications.  Goal of Therapy:  Heparin level 0.3-0.7 units/ml Monitor platelets by anticoagulation protocol: Yes   Plan:  -Will increase heparin gtt to 950 units/hr  -Monitor daily heparin level, CBC and s/s of bleeding  Thanks for allowing pharmacy to be a part of this patient's care.  Rober Minion, PharmD., MS Clinical Pharmacist Pager:  [phone] Thank you for allowing pharmacy to be part of this patients care team. 03/27/2018 2:09 PM

## 2018-03-27 NOTE — Progress Notes (Signed)
Pharmacist Heart Failure Core Measure Documentation  Assessment: Betty [name] has an EF documented as 15 on 4/19 by ECHO  Rationale: Heart failure patients with left ventricular systolic dysfunction (LVSD) and an EF < 40% should be prescribed an angiotensin converting enzyme inhibitor (ACEI) or angiotensin receptor blocker (ARB) at discharge unless a contraindication is documented in the medical record.  This patient is not currently on an ACEI or ARB for HF.  This note is being placed in the record in order to provide documentation that a contraindication to the use of these agents is present for this encounter.  ACE Inhibitor or Angiotensin Receptor Blocker is contraindicated (specify all that apply)  []   ACEI allergy AND ARB allergy []   Angioedema []   Moderate or severe aortic stenosis []   Hyperkalemia []   Hypotension []   Renal artery stenosis [x]   Worsening renal function, preexisting renal disease or dysfunction   Bonnita Nasuti Pharm.D. CPP, BCPS Clinical Pharmacist [phone] 03/27/2018 3:28 PM

## 2018-03-27 NOTE — Progress Notes (Signed)
Progress Note  Patient Name: Betty [name] Date of Encounter: 03/27/2018  Primary Cardiologist: Skeet Latch, MD   Subjective   Feeling well.  No chest pain or shortness of breath.  Inpatient Medications    Scheduled Meds: . aspirin EC  81 mg Oral QHS  . atorvastatin  40 mg Oral q1800  . metoprolol succinate  100 mg Oral QHS  . multivitamin with minerals  0.5 tablet Oral QHS  . sodium chloride flush  3 mL Intravenous Q12H  . sodium chloride flush  3 mL Intravenous Q12H   Continuous Infusions: . sodium chloride    . sodium chloride    . sodium chloride 10 mL/hr at 03/25/18 0544  . sodium chloride    . heparin 850 Units/hr (03/27/18 0617)   PRN Meds: sodium chloride, sodium chloride, acetaminophen, LORazepam, ondansetron (ZOFRAN) IV, sodium chloride flush, sodium chloride flush   Vital Signs    Vitals:   03/26/18 2132 03/26/18 2244 03/26/18 2252 03/27/18 0538  BP: 116/72   121/76  Pulse: 92 89  86  Resp: 20  18   Temp: 97.8 F (36.6 C)   97.7 F (36.5 C)  TempSrc: Oral   Oral  SpO2: 93%   100%  Weight:    136 lb 11.2 oz (62 kg)  Height:        Intake/Output Summary (Last 24 hours) at 03/27/2018 0917 Last data filed at 03/27/2018 0700 Gross per 24 hour  Intake 483 ml  Output 1500 ml  Net -1017 ml   Filed Weights   03/25/18 0500 03/26/18 0548 03/27/18 0538  Weight: 136 lb 8 oz (61.9 kg) 137 lb 9.6 oz (62.4 kg) 136 lb 11.2 oz (62 kg)    Telemetry    Sinus rhythm.  NSVT 5 and 7 beats - Personally Reviewed  ECG    n/a - Personally Reviewed  Physical Exam   VS:  BP 121/76 (BP Location: Left Arm)   Pulse 86   Temp 97.7 F (36.5 C) (Oral)   Resp 18   Ht 5\' 4"  (1.626 m)   Wt 136 lb 11.2 oz (62 kg)   SpO2 100%   BMI 23.46 kg/m  , BMI Body mass index is 23.46 kg/m. GENERAL:  Well appearing HEENT: Pupils equal round and reactive, fundi not visualized, oral mucosa unremarkable NECK:  No jugular venous distention, waveform within normal limits,  carotid upstroke brisk and symmetric, no bruits LUNGS:  Clear to auscultation bilaterally HEART:  RRR.  PMI not displaced or sustained,S1 and S2 within normal limits, no S3, no S4, no clicks, no rubs, no murmurs ABD:  Flat, positive bowel sounds normal in frequency in pitch, no bruits, no rebound, no guarding, no midline pulsatile mass, no hepatomegaly, no splenomegaly EXT:  2 plus pulses throughout, no edema, no cyanosis no clubbing SKIN:  No rashes no nodules NEURO:  Cranial nerves II through XII grossly intact, motor grossly intact throughout [facility]:  Cognitively intact, oriented to person place and time    Labs    Chemistry Recent Labs  Lab 03/25/18 0216 03/26/18 0501 03/27/18 0338  NA 144 143 142  K 4.1 3.8 3.8  CL 111 110 109  CO2 20* 20* 22  GLUCOSE 177* 86 99  BUN 31* 47* 55*  CREATININE 2.42* 3.08* 3.01*  CALCIUM 9.4 8.8* 9.2  GFRNONAA 19* 14* 14*  GFRAA 22* 16* 17*  ANIONGAP 13 13 11      Hematology Recent Labs  Lab 03/25/18 0216 03/26/18  0501 03/27/18 0338  WBC 8.8 10.3 8.9  RBC 3.69* 3.30* 3.38*  HGB 11.0* 9.8* 10.1*  HCT 32.8* 29.9* 30.1*  MCV 88.9 90.6 89.1  MCH 29.8 29.7 29.9  MCHC 33.5 32.8 33.6  RDW 12.9 13.1 12.9  PLT 318 250 252    Cardiac Enzymes Recent Labs  Lab 03/24/18 1927 03/25/18 0216 03/25/18 0726  TROPONINI 0.19* 0.17* 0.19*    Recent Labs  Lab 03/24/18 1617  TROPIPOC 0.16*     BNP Recent Labs  Lab 03/24/18 1552  BNP >4,500.0*     DDimer No results for input(s): DDIMER in the last 168 hours.   Radiology    Nm Myocar Multi W/spect W/wall Motion / Ef  Result Date: 03/25/2018  There was no ST segment deviation noted during stress.  Findings consistent with ischemia.  This is a high risk study.  The left ventricular ejection fraction is severely decreased (<30%).  Severe LVE with diffuse hypokinesis inferior basal dyskinesis EF 17% Poor quality study with stress images having diffusely less counts than rest  Suggestion of septal apical distal anterior wall and inferior wall ischemia    Cardiac Studies   Echo 03/03/18: Study Conclusions  - Left ventricle: LVEF is severely depressed at approximately 15%   with diffuse hypokinesis; inferior akiensis. The cavity size was   normal. Wall thickness was increased in a pattern of mild LVH. - Mitral valve: There was mild regurgitation. - Pericardium, extracardiac: A trivial pericardial effusion was   identified.  Patient Profile     Betty [name] is a 69F with a history of lung cancer s/p chemo (carboplatin and gemcitabine) and lobectomy, dementia, prior tobacco abuse, CKD IV, COPD, hypertension and hyperlipidemia here with acute systolic and diastolic heart failure and NSTEMI.    Assessment & Plan    # Acute systolic and diastolic heart failure:  Euvolemic.  Renal function worsened with diuresis despite her BNP of 4500 on admission.  Renal function very mildly improved today.  We will give a slow 500 mL fluid bolus.  LVEF newly diagnosed at 15%.  She has wall motion abnormalities and an abnormal stress test.  She will need left heart catheterization this admission once renal function improves.  Reduced renal function.  # Abnormal stress: LHC/RHC once renal function improves as above.  Continue aspirin, atorvastatin, metoprolol, and heparin.  Continue metoprolol succinate.  She is not a candidate for an ACE inhibitor/ARB due to  # Acute on chronic renal failure:  Gentle fluids as above.  Creatinine now 3. Was 2.3 on admission.   # Dementia:  Patient was living alone at baseline.  Care management has been contacted and she will need planning for discharge.  Needs outpatient work up.     For questions or updates, please contact [place] Please consult [email or web address] for contact info under Cardiology/STEMI.      Signed, Skeet Latch, MD  03/27/2018, 9:17 AM

## 2018-03-27 NOTE — Plan of Care (Signed)
Remains on IV heparin, walks to restroom with standby assistance, PT eval complete awaiting OT consult, no confusion or disorientation noted, family at bedside, call light in reach, compliant with calling for assistance.

## 2018-03-27 NOTE — Evaluation (Signed)
Occupational Therapy Evaluation Patient Details Name: Betty [name] MRN: [id number] DOB: 03-Aug-1946 Today's Date: 03/27/2018    History of Present Illness Pt adm with NSTEMI and acute systolic and diastolic heart failure. PMH - lung ca, copd, dementia, ckd, htn   Clinical Impression   This 72 y/o female presents with the above. At baseline pt is independent with ADLs and functional mobility, lives alone. Pt completing functional mobility without AD this session, demonstrating standing grooming and LB ADLs with supervision throughout, no overt LOB noted. Pt reports feeling at her baseline regarding ADLs and mobility completion, reports she has family available and who check in regularly. Feel pt is safe to return home once medically ready from OT standpoint. No further OT needs identified at this time. Will sign off.     Follow Up Recommendations  No OT follow up;Supervision - Intermittent    Equipment Recommendations  None recommended by OT           Precautions / Restrictions Precautions Precautions: None Restrictions Weight Bearing Restrictions: No      Mobility Bed Mobility               General bed mobility comments: OOB in recliner upon arrival   Transfers Overall transfer level: Independent Equipment used: None                  Balance Overall balance assessment: Independent                                         ADL either performed or assessed with clinical judgement   ADL Overall ADL's : Needs assistance/impaired Eating/Feeding: Modified independent;Sitting   Grooming: Wash/dry hands;Supervision/safety;Standing   Upper Body Bathing: Supervision/ safety;Sitting   Lower Body Bathing: Min guard;Sit to/from stand   Upper Body Dressing : Set up;Sitting   Lower Body Dressing: Min guard;Sit to/from stand   Toilet Transfer: Supervision/safety;Ambulation;Regular Glass blower/designer Details (indicate cue type and reason):  simulated in transfer to/from recliner  Toileting- Water quality scientist and Hygiene: Supervision/safety;Sit to/from stand       Functional mobility during ADLs: Supervision/safety       Pertinent Vitals/Pain Pain Assessment: No/denies pain          Extremity/Trunk Assessment Upper Extremity Assessment Upper Extremity Assessment: Overall WFL for tasks assessed   Lower Extremity Assessment Lower Extremity Assessment: Overall WFL for tasks assessed       Communication Communication Communication: No difficulties    Arousal/Alertness: Awake/alert Behavior During Therapy: WFL for tasks assessed/performed Overall Cognitive Status: Within Functional Limits for tasks assessed                                                      Home Living Family/patient expects to be discharged to:: Private residence Living Arrangements: Alone Available Help at Discharge: Family;Available PRN/intermittently Type of Home: House       Home Layout: One level     Bathroom Shower/Tub: Teacher, early years/pre: Standard     Home Equipment: None          Prior Functioning/Environment Level of Independence: Independent        Comments: Reports 1 fall 2 weeks ago of unknown etiology  OT Problem List: Decreased strength;Decreased activity tolerance      OT Treatment/Interventions:      OT Goals(Current goals can be found in the care plan section) Acute Rehab OT Goals Patient Stated Goal: to go home OT Goal Formulation: All assessment and education complete, DC therapy   AM-PAC PT "6 Clicks" Daily Activity     Outcome Measure Help from another person eating meals?: None Help from another person taking care of personal grooming?: None Help from another person toileting, which includes using toliet, bedpan, or urinal?: None Help from another person bathing (including washing, rinsing, drying)?: A Little Help from another person to put on  and taking off regular upper body clothing?: None Help from another person to put on and taking off regular lower body clothing?: None 6 Click Score: 23   End of Session Equipment Utilized During Treatment: Gait belt Nurse Communication: Mobility status  Activity Tolerance: Patient tolerated treatment well Patient left: in chair;with call bell/phone within reach  OT Visit Diagnosis: Muscle weakness (generalized) (M62.81)                Time: 1440-1455 OT Time Calculation (min): 15 min Charges:  OT General Charges $OT Visit: 1 Visit OT Evaluation $OT Eval Moderate Complexity: 1 Mod   G-Codes:    Betty [name], OT Pager [phone] 03/27/2018  Betty [name] 03/27/2018, 4:44 PM

## 2018-03-28 ENCOUNTER — Encounter (HOSPITAL_COMMUNITY)
Admission: EM | Disposition: A | Discharge status: Home Health | Payer: Self-pay | Source: Home / Self Care | Attending: Cardiovascular Disease

## 2018-03-28 LAB — HEPARIN LEVEL (UNFRACTIONATED): HEPARIN UNFRACTIONATED: 0.56 [IU]/mL (ref 0.30–0.70)

## 2018-03-28 LAB — BASIC METABOLIC PANEL
Anion gap: 10 (ref 5–15)
BUN: 43 mg/dL — AB (ref 6–20)
CHLORIDE: 112 mmol/L — AB (ref 101–111)
CO2: 21 mmol/L — AB (ref 22–32)
CREATININE: 2.5 mg/dL — AB (ref 0.44–1.00)
Calcium: 8.9 mg/dL (ref 8.9–10.3)
GFR calc Af Amer: 21 mL/min — ABNORMAL LOW (ref >60)
GFR calc non Af Amer: 18 mL/min — ABNORMAL LOW (ref >60)
Glucose, Bld: 102 mg/dL — ABNORMAL HIGH (ref 65–99)
POTASSIUM: 4.1 mmol/L (ref 3.5–5.1)
SODIUM: 143 mmol/L (ref 135–145)

## 2018-03-28 LAB — CBC
HEMATOCRIT: 29.8 % — AB (ref 36.0–46.0)
HEMOGLOBIN: 10 g/dL — AB (ref 12.0–15.0)
MCH: 30 pg (ref 26.0–34.0)
MCHC: 33.6 g/dL (ref 30.0–36.0)
MCV: 89.5 fL (ref 78.0–100.0)
Platelets: 276 10*3/uL (ref 150–400)
RBC: 3.33 MIL/uL — ABNORMAL LOW (ref 3.87–5.11)
RDW: 13 % (ref 11.5–15.5)
WBC: 8.3 10*3/uL (ref 4.0–10.5)

## 2018-03-28 SURGERY — INVASIVE LAB ABORTED CASE

## 2018-03-28 MED ORDER — SODIUM CHLORIDE 0.9% FLUSH
3.0000 mL | Freq: Two times a day (BID) | INTRAVENOUS | Status: DC
  Administered 2018-03-28: 3 mL via INTRAVENOUS

## 2018-03-28 MED ORDER — SODIUM CHLORIDE 0.9 % IV SOLN: INTRAVENOUS | Status: DC

## 2018-03-28 MED ORDER — SODIUM CHLORIDE 0.9% FLUSH: 3.0000 mL | INTRAVENOUS | Status: DC | PRN

## 2018-03-28 MED ORDER — HEPARIN (PORCINE) IN NACL 2-0.9 UNITS/ML
INTRAMUSCULAR | Status: AC | PRN
  Administered 2018-03-28 (×2): 500 mL

## 2018-03-28 MED ORDER — SODIUM CHLORIDE 0.9 % IV SOLN: 250.0000 mL | INTRAVENOUS | Status: DC | PRN

## 2018-03-28 SURGICAL SUPPLY — 8 items
CATH INFINITI 5FR MULTPACK ANG (CATHETERS) IMPLANT
CATH SWAN GANZ 7F STRAIGHT (CATHETERS) IMPLANT
KIT HEART LEFT (KITS) IMPLANT
PACK CARDIAC CATHETERIZATION (CUSTOM PROCEDURE TRAY) IMPLANT
SHEATH PINNACLE 5F 10CM (SHEATH) IMPLANT
SHEATH PINNACLE 7F 10CM (SHEATH) IMPLANT
TRANSDUCER W/STOPCOCK (MISCELLANEOUS) IMPLANT
WIRE EMERALD 3MM-J .035X150CM (WIRE) IMPLANT

## 2018-03-28 NOTE — Progress Notes (Signed)
    Patient memory and ability to care for herself is coming to question during this hospitalization.  Dr Oval Linsey reviewed the situation with the pt's son, Shonteria Abeln, who agrees.  Psych assessment felt needed, Dr. Mariea Clonts was contacted. Dr. Mariea Clonts stated that if patient had no PT/OT issues, psych would be unable to provide helpful information.  Recontacted PT/OT and asked them to see the patient.  After they saw the patient, psych paged to come and see the patient.  Consult order still active, have not heard back from psych on call. May need to recontact tomorrow.   Rosaria Ferries, PA-C 03/28/2018 5:52 PM Beeper [phone]

## 2018-03-28 NOTE — Care Management Important Message (Signed)
Important Message  Patient Details  Name: Betty [name] MRN: [id number] Date of Birth: July 08, 1946   Medicare Important Message Given:  Yes    Barb Merino Melana Hingle 03/28/2018, 3:43 PM

## 2018-03-28 NOTE — Interval H&P Note (Signed)
Cath Lab Visit (complete for each Cath Lab visit)  Clinical Evaluation Leading to the Procedure:   ACS: No.  Non-ACS:    Anginal Classification: CCS II  Anti-ischemic medical therapy: Minimal Therapy (1 class of medications)  Non-Invasive Test Results: High-risk stress test findings: cardiac mortality >3%/year  Prior CABG: No previous CABG      History and Physical Interval Note:  03/28/2018 2:07 PM  Betty [name]  has presented today for surgery, with the diagnosis of cp, EF 15%  The various methods of treatment have been discussed with the patient and family. After consideration of risks, benefits and other options for treatment, the patient has consented to  Procedure(s): LEFT HEART CATH AND CORONARY ANGIOGRAPHY (N/A) as a surgical intervention .  The patient's history has been reviewed, patient examined, no change in status, stable for surgery.  I have reviewed the patient's chart and labs.  Questions were answered to the patient's satisfaction.     Quay Burow

## 2018-03-28 NOTE — Progress Notes (Signed)
PT Cancellation Note  Patient Details Name: Betty [name] MRN: [id number] DOB: 07-30-1946   Cancelled Treatment:    Reason Eval/Treat Not Completed: Other (comment).  Pt is pleasant but flatly refused to work with PT and is not willing to get OOB.  PT evaluation was done yesterday but reordered today as pt is very confused and unable to assist with movement as she did yesterday.  Re-attempt in the AM.   Ramond Dial 03/28/2018, 4:52 PM   Mee Hives, PT MS Acute Rehab Dept. Number: [place] and [place]

## 2018-03-28 NOTE — Evaluation (Signed)
Occupational Therapy Evaluation Patient Details Name: Betty [name] MRN: [id number] DOB: 1946/07/30 Today's Date: 03/28/2018    History of Present Illness Pt adm with NSTEMI and acute systolic and diastolic heart failure. PMH - lung ca, copd, dementia, ckd, htn   Clinical Impression   Pt seen for OT session, presents sitting EOB with family present initially. Pt completing room level functional mobility without AD and overall supervision; completing standing grooming ADLs with minguard assist. Pt with decreased activity tolerance this session compared to previous OT session and requiring x2 seated rest breaks during completion of standing grooming ADL tasks, transitioning into completing grooming tasks while seated. Pt with baseline dementia, demonstrates ability to complete basic functional tasks without difficulty, though cognitive deficits becoming more apparent as session progressed and when presented with higher level cognitive tasks including recall of multi-step directions. Due to cognitive deficits and pt's current activity tolerance, recommend pt have 24hr supervision/assist after discharge to maximize her overall safety and independence during ADLs, iADLs and mobility tasks at home. Pt may benefit from [facility] with [facility] if eligible. Will continue to follow while pt remains in acute setting to progress pt towards established OT goals.     Follow Up Recommendations  Supervision/Assistance - 24 hour;Home health OT    Equipment Recommendations  Tub/shower seat           Precautions / Restrictions Precautions Precautions: None Restrictions Weight Bearing Restrictions: No      Mobility Bed Mobility               General bed mobility comments: sitting EOB upon arrival   Transfers Overall transfer level: Needs assistance Equipment used: None Transfers: Sit to/from Stand Sit to Stand: Supervision         General transfer comment: supervision for safety; no physical  assist needed     Balance Overall balance assessment: No apparent balance deficits (not formally assessed)                                         ADL either performed or assessed with clinical judgement   ADL Overall ADL's : Needs assistance/impaired     Grooming: Wash/dry hands;Standing;Wash/dry face;Oral care;Sitting;Min guard Grooming Details (indicate cue type and reason): completing in standing then progressed to completing in sitting due to decreased activity tolerance.      Lower Body Bathing: Min guard;Sit to/from stand                       Functional mobility during ADLs: Supervision/safety General ADL Comments: pt completing room level functional mobility, provided pt with x3 standing grooming tasks to complete to assess pt's ability to recall multistep commands and pt requiring min cues to recall list of tasks; pt completing standing trials x2 at sink as pt needing seated rest break during tasks, initates taking rest break. noted to have decreased activity tolerance compared to yesterday as yesterday pt was able to ambulate in hallway with therapist without difficulty of s/s of SOB                          Pertinent Vitals/Pain Pain Assessment: No/denies pain          Extremity/Trunk Assessment Upper Extremity Assessment Upper Extremity Assessment: Overall WFL for tasks assessed   Lower Extremity Assessment Lower Extremity Assessment: Defer to PT evaluation  Communication Communication Communication: No difficulties   Cognition Arousal/Alertness: Awake/alert Behavior During Therapy: WFL for tasks assessed/performed Overall Cognitive Status: History of cognitive impairments - at baseline                                 General Comments: pt with baseline dementia; WFL to complete basic functional tasks though cognitive deficits noted more when higher level cognitive challenges were presented; pt oriented to  person, place, day of week and year, disoriented to month and situation. Pt noted to confabulate at times to compensate for confusion. Pt also does not recall this therapist from previous session. Pt somewhat defensive    General Comments                  Home Living Family/patient expects to be discharged to:: Private residence Living Arrangements: Alone Available Help at Discharge: Family;Available PRN/intermittently Type of Home: House       Home Layout: One level     Bathroom Shower/Tub: Teacher, early years/pre: Standard     Home Equipment: None          Prior Functioning/Environment Level of Independence: Independent        Comments: Reports 1 fall 2 weeks ago of unknown etiology        OT Problem List: Decreased strength;Decreased activity tolerance;Decreased cognition      OT Treatment/Interventions: Self-care/ADL training;DME and/or AE instruction;Therapeutic activities;Balance training;Energy conservation;Patient/family education    OT Goals(Current goals can be found in the care plan section) Acute Rehab OT Goals Patient Stated Goal: to go home OT Goal Formulation: With patient Time For Goal Achievement: 04/11/18 Potential to Achieve Goals: Good  OT Frequency: Min 2X/week                             AM-PAC PT "6 Clicks" Daily Activity     Outcome Measure Help from another person eating meals?: None Help from another person taking care of personal grooming?: None Help from another person toileting, which includes using toliet, bedpan, or urinal?: None Help from another person bathing (including washing, rinsing, drying)?: A Little Help from another person to put on and taking off regular upper body clothing?: None Help from another person to put on and taking off regular lower body clothing?: A Little 6 Click Score: 22   End of Session Equipment Utilized During Treatment: Gait belt Nurse Communication: Mobility  status  Activity Tolerance: Patient tolerated treatment well;Patient limited by fatigue Patient left: with call bell/phone within reach;with family/visitor present;Other (comment)(sitting EOB )  OT Visit Diagnosis: Muscle weakness (generalized) (M62.81)                Time: 6948-5462 OT Time Calculation (min): 19 min Charges:  OT General Charges $OT Visit: 1 Visit OT Evaluation $OT Eval Moderate Complexity: 1 Mod G-Codes:     [name], OT Pager [phone] 03/28/2018  Raymondo Band 03/28/2018, 4:37 PM

## 2018-03-28 NOTE — Progress Notes (Addendum)
Progress Note  Patient Name: Betty [name] Date of Encounter: 03/28/2018  Primary Cardiologist: Skeet Latch, MD   Subjective   Does not remember any discussions regarding cath. Willing to do whatever her son recommends.  Denies chest pain or SOB.  Inpatient Medications    Scheduled Meds: . aspirin EC  81 mg Oral QHS  . atorvastatin  40 mg Oral q1800  . metoprolol succinate  100 mg Oral QHS  . multivitamin with minerals  0.5 tablet Oral QHS  . sodium chloride flush  3 mL Intravenous Q12H  . sodium chloride flush  3 mL Intravenous Q12H   Continuous Infusions: . sodium chloride    . sodium chloride    . sodium chloride 10 mL/hr at 03/25/18 0544  . sodium chloride 75 mL/hr (03/27/18 2116)  . heparin 950 Units/hr (03/28/18 0007)   PRN Meds: sodium chloride, sodium chloride, acetaminophen, LORazepam, ondansetron (ZOFRAN) IV, sodium chloride flush, sodium chloride flush   Vital Signs    Vitals:   03/27/18 0538 03/27/18 1320 03/27/18 2044 03/28/18 0548  BP: 121/76 118/78 117/68 121/75  Pulse: 86 82 95 80  Resp:  18 18 18   Temp: 97.7 F (36.5 C) 98 F (36.7 C) (!) 97.5 F (36.4 C) 97.7 F (36.5 C)  TempSrc: Oral Oral Oral Oral  SpO2: 100%  98% 97%  Weight: 136 lb 11.2 oz (62 kg)   138 lb 1.6 oz (62.6 kg)  Height:        Intake/Output Summary (Last 24 hours) at 03/28/2018 0758 Last data filed at 03/28/2018 0648 Gross per 24 hour  Intake 2644.5 ml  Output 500 ml  Net 2144.5 ml   Filed Weights   03/26/18 0548 03/27/18 0538 03/28/18 0548  Weight: 137 lb 9.6 oz (62.4 kg) 136 lb 11.2 oz (62 kg) 138 lb 1.6 oz (62.6 kg)    Telemetry     SR, no VT sine 05/22 - Personally Reviewed  ECG    n/a - Personally Reviewed  Physical Exam   VS:  BP 121/75 (BP Location: Left Arm)   Pulse 80   Temp 97.7 F (36.5 C) (Oral)   Resp 18   Ht 5\' 4"  (1.626 m)   Wt 138 lb 1.6 oz (62.6 kg)   SpO2 97%   BMI 23.70 kg/m  , BMI Body mass index is 23.7 kg/m. GENERAL:  Well  appearing HEENT: Pupils equal round and reactive, fundi not visualized, oral mucosa unremarkable NECK:  Minimal jugular venous distention, waveform within normal limits, carotid upstroke brisk and symmetric  LUNGS:  Clear to auscultation bilaterally HEART:  RRR.  PMI not displaced or sustained,S1 and S2 within normal limits, no S3, no S4, no clicks, no rubs, no murmurs ABD:  Flat, positive bowel sounds normal in frequency in pitch, no bruits, no rebound, no guarding, no midline pulsatile mass, no hepatomegaly, no splenomegaly EXT:  2 plus pulses throughout, no edema, no cyanosis no clubbing SKIN:  No rashes no nodules NEURO:  Cranial nerves II through XII grossly intact, motor grossly intact throughout [facility]:  Cognitively intact, oriented to person place and time    Labs    Chemistry Recent Labs  Lab 03/26/18 0501 03/27/18 0338 03/28/18 0523  NA 143 142 143  K 3.8 3.8 4.1  CL 110 109 112*  CO2 20* 22 21*  GLUCOSE 86 99 102*  BUN 47* 55* 43*  CREATININE 3.08* 3.01* 2.50*  CALCIUM 8.8* 9.2 8.9  GFRNONAA 14* 14* 18*  GFRAA 16* 17* 21*  ANIONGAP 13 11 10      Hematology Recent Labs  Lab 03/26/18 0501 03/27/18 0338 03/28/18 0523  WBC 10.3 8.9 8.3  RBC 3.30* 3.38* 3.33*  HGB 9.8* 10.1* 10.0*  HCT 29.9* 30.1* 29.8*  MCV 90.6 89.1 89.5  MCH 29.7 29.9 30.0  MCHC 32.8 33.6 33.6  RDW 13.1 12.9 13.0  PLT 250 252 276    Cardiac Enzymes Recent Labs  Lab 03/24/18 1927 03/25/18 0216 03/25/18 0726  TROPONINI 0.19* 0.17* 0.19*    Recent Labs  Lab 03/24/18 1617  TROPIPOC 0.16*     BNP Recent Labs  Lab 03/24/18 1552  BNP >4,500.0*     Radiology    No results found.  Cardiac Studies   Echo 03/03/18: Study Conclusions  - Left ventricle: LVEF is severely depressed at approximately 15%   with diffuse hypokinesis; inferior akiensis. The cavity size was   normal. Wall thickness was increased in a pattern of mild LVH. - Mitral valve: There was mild  regurgitation. - Pericardium, extracardiac: A trivial pericardial effusion was   identified.  Patient Profile     Ms. Nevin is a 7F with a history of lung cancer s/p chemo (carboplatin and gemcitabine) and lobectomy, dementia, prior tobacco abuse, CKD IV, COPD, hypertension and hyperlipidemia here with acute systolic and diastolic heart failure and NSTEMI.    Assessment & Plan    # Acute systolic and diastolic heart failure:  Euvolemic. -  Renal function worsened with diuresis despite her BNP of 4500 on admission.   - Renal function improved today, NPO for cath.   -  LVEF newly diagnosed at 15%.  She has wall motion abnormalities and an abnormal stress test.  - She will need left heart catheterization this admission, do today as renal function has improved - cors only w/ staged PCI if needed - Dr Oval Linsey has reviewed the risks and benefits of the procedure with pt son, he agrees to proceed. Pt does not remember discussion of cath risks and benefits but agrees to proceed because her son said that was the best plan.   # Abnormal stress: LHC/RHC planned for today now that renal function has improved -  Continue med rx w/ aspirin, atorvastatin, metoprolol, and heparin.    - She is not a candidate for an ACE inhibitor/ARB due to poor renal function  # Acute on chronic renal failure:  - Still on IVF at 75 cc/hr.  Creatinine peak 3.08, Was 2.3 on admission. - Cr now 2.5, not sure we can get it any lower without worsening volume status   # Dementia:  Patient was living alone at baseline.  Care management has been contacted and she will need planning for discharge.  Needs outpatient work up.  - son is local and involved in her care - she is refusing [facility] - PT/OT have seen and no acute needs.  For questions or updates, please contact [place] Please consult [email or web address] for contact info under Cardiology/STEMI.      Signed, Rosaria Ferries, PA-C  03/28/2018, 7:58 AM

## 2018-03-28 NOTE — Progress Notes (Signed)
[place] for heparin Indication: chest pain/ACS  Allergies  Allergen Reactions  . Sulfa Antibiotics Itching   Patient Measurements: Height: 5\' 4"  (162.6 cm) Weight: 138 lb 1.6 oz (62.6 kg) IBW/kg (Calculated) : 54.7 Heparin Dosing Weight: 65 kg  Vital Signs: Temp: 97.7 F (36.5 C) (05/24 0548) Temp Source: Oral (05/24 0548) BP: 121/75 (05/24 0548) Pulse Rate: 80 (05/24 0548)  Labs: Recent Labs    03/26/18 0501 03/27/18 0338 03/27/18 1232 03/28/18 0523  HGB 9.8* 10.1*  --  10.0*  HCT 29.9* 30.1*  --  29.8*  PLT 250 252  --  276  HEPARINUNFRC 0.68 <0.10* 0.27* 0.56  CREATININE 3.08* 3.01*  --  2.50*   Estimated Creatinine Clearance: 17.3 mL/min (A) (by C-G formula based on SCr of 2.5 mg/dL (H)).  Medical History: Past Medical History:  Diagnosis Date  . Acute mastitis of right breast    10/2003  . Arthritis    bursitis in right shoulder  . Chronic kidney disease    CKD stage 3 04/28/15  . Colon polyps   . 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 Family history of breast cancer   . GERD (gastroesophageal reflux disease)   . Hypercholesterolemia   . Hypertension   . NSCL ca dx'd 03/2015   chemo; RL Lobectomy  . Sciatica of right side   . Seasonal allergic rhinitis   . Tobacco dependence   . Tubular adenoma of colon   . Vitamin D deficiency    Medications:  Medications Prior to Admission  Medication Sig Dispense Refill Last Dose  . acetaminophen (TYLENOL) 325 MG tablet Take 650 mg by mouth every 6 (six) hours as needed for headache (pain). Reported on 03/27/2016   several months ago  . aspirin EC 81 MG tablet Take 81 mg by mouth at bedtime.   03/23/2018 at pm  . Cholecalciferol (VITAMIN D3 PO) Take 1 capsule by mouth at bedtime.   03/23/2018 at pm  . metoprolol succinate (TOPROL-XL) 100 MG 24 hr tablet Take 100 mg by mouth at bedtime.  6 03/23/2018 at 2200  . Multiple Vitamin (MULTIVITAMIN WITH  MINERALS) TABS tablet Take 0.5 tablets by mouth at bedtime.   03/23/2018 at pm  . furosemide (LASIX) 40 MG tablet Take 40 mg by mouth at bedtime.  1 couple nights ago at pm   Assessment: 14 YOF with h/o lung cancer on carboplatin and gemcitabine here with acute CHF and NSTEMI. She is now s/p nuclear stress test and awaiting a cath/intervention. Pharmacy is managing her heparin therapy while her cardiac work up is ongoing.  Heparin level therapeutic: 0.56; CBC stable, no overt bleeding or infusion issues reported.  Goal of Therapy:  Heparin level 0.3-0.7 units/ml Monitor platelets by anticoagulation protocol: Yes   Plan:  -Continue heparin gtt to 950 units/hr  -Monitor daily heparin level, CBC and s/s of bleeding  Thanks for allowing pharmacy to be a part of this patient's care.  Georga Bora, PharmD Clinical Pharmacist 03/28/2018 9:47 AM

## 2018-03-28 NOTE — H&P (View-Only) (Signed)
Progress Note  Patient Name: Betty [name] Date of Encounter: 03/28/2018  Primary Cardiologist: Skeet Latch, MD   Subjective   Does not remember any discussions regarding cath. Willing to do whatever her son recommends.  Denies chest pain or SOB.  Inpatient Medications    Scheduled Meds: . aspirin EC  81 mg Oral QHS  . atorvastatin  40 mg Oral q1800  . metoprolol succinate  100 mg Oral QHS  . multivitamin with minerals  0.5 tablet Oral QHS  . sodium chloride flush  3 mL Intravenous Q12H  . sodium chloride flush  3 mL Intravenous Q12H   Continuous Infusions: . sodium chloride    . sodium chloride    . sodium chloride 10 mL/hr at 03/25/18 0544  . sodium chloride 75 mL/hr (03/27/18 2116)  . heparin 950 Units/hr (03/28/18 0007)   PRN Meds: sodium chloride, sodium chloride, acetaminophen, LORazepam, ondansetron (ZOFRAN) IV, sodium chloride flush, sodium chloride flush   Vital Signs    Vitals:   03/27/18 0538 03/27/18 1320 03/27/18 2044 03/28/18 0548  BP: 121/76 118/78 117/68 121/75  Pulse: 86 82 95 80  Resp:  18 18 18   Temp: 97.7 F (36.5 C) 98 F (36.7 C) (!) 97.5 F (36.4 C) 97.7 F (36.5 C)  TempSrc: Oral Oral Oral Oral  SpO2: 100%  98% 97%  Weight: 136 lb 11.2 oz (62 kg)   138 lb 1.6 oz (62.6 kg)  Height:        Intake/Output Summary (Last 24 hours) at 03/28/2018 0758 Last data filed at 03/28/2018 0648 Gross per 24 hour  Intake 2644.5 ml  Output 500 ml  Net 2144.5 ml   Filed Weights   03/26/18 0548 03/27/18 0538 03/28/18 0548  Weight: 137 lb 9.6 oz (62.4 kg) 136 lb 11.2 oz (62 kg) 138 lb 1.6 oz (62.6 kg)    Telemetry     SR, no VT sine 05/22 - Personally Reviewed  ECG    n/a - Personally Reviewed  Physical Exam   VS:  BP 121/75 (BP Location: Left Arm)   Pulse 80   Temp 97.7 F (36.5 C) (Oral)   Resp 18   Ht 5\' 4"  (1.626 m)   Wt 138 lb 1.6 oz (62.6 kg)   SpO2 97%   BMI 23.70 kg/m  , BMI Body mass index is 23.7 kg/m. GENERAL:  Well  appearing HEENT: Pupils equal round and reactive, fundi not visualized, oral mucosa unremarkable NECK:  Minimal jugular venous distention, waveform within normal limits, carotid upstroke brisk and symmetric  LUNGS:  Clear to auscultation bilaterally HEART:  RRR.  PMI not displaced or sustained,S1 and S2 within normal limits, no S3, no S4, no clicks, no rubs, no murmurs ABD:  Flat, positive bowel sounds normal in frequency in pitch, no bruits, no rebound, no guarding, no midline pulsatile mass, no hepatomegaly, no splenomegaly EXT:  2 plus pulses throughout, no edema, no cyanosis no clubbing SKIN:  No rashes no nodules NEURO:  Cranial nerves II through XII grossly intact, motor grossly intact throughout [facility]:  Cognitively intact, oriented to person place and time    Labs    Chemistry Recent Labs  Lab 03/26/18 0501 03/27/18 0338 03/28/18 0523  NA 143 142 143  K 3.8 3.8 4.1  CL 110 109 112*  CO2 20* 22 21*  GLUCOSE 86 99 102*  BUN 47* 55* 43*  CREATININE 3.08* 3.01* 2.50*  CALCIUM 8.8* 9.2 8.9  GFRNONAA 14* 14* 18*  GFRAA 16* 17* 21*  ANIONGAP 13 11 10      Hematology Recent Labs  Lab 03/26/18 0501 03/27/18 0338 03/28/18 0523  WBC 10.3 8.9 8.3  RBC 3.30* 3.38* 3.33*  HGB 9.8* 10.1* 10.0*  HCT 29.9* 30.1* 29.8*  MCV 90.6 89.1 89.5  MCH 29.7 29.9 30.0  MCHC 32.8 33.6 33.6  RDW 13.1 12.9 13.0  PLT 250 252 276    Cardiac Enzymes Recent Labs  Lab 03/24/18 1927 03/25/18 0216 03/25/18 0726  TROPONINI 0.19* 0.17* 0.19*    Recent Labs  Lab 03/24/18 1617  TROPIPOC 0.16*     BNP Recent Labs  Lab 03/24/18 1552  BNP >4,500.0*     Radiology    No results found.  Cardiac Studies   Echo 03/03/18: Study Conclusions  - Left ventricle: LVEF is severely depressed at approximately 15%   with diffuse hypokinesis; inferior akiensis. The cavity size was   normal. Wall thickness was increased in a pattern of mild LVH. - Mitral valve: There was mild  regurgitation. - Pericardium, extracardiac: A trivial pericardial effusion was   identified.  Patient Profile     Betty [name] is a 59F with a history of lung cancer s/p chemo (carboplatin and gemcitabine) and lobectomy, dementia, prior tobacco abuse, CKD IV, COPD, hypertension and hyperlipidemia here with acute systolic and diastolic heart failure and NSTEMI.    Assessment & Plan    # Acute systolic and diastolic heart failure:  Euvolemic. -  Renal function worsened with diuresis despite her BNP of 4500 on admission.   - Renal function improved today, NPO for cath.   -  LVEF newly diagnosed at 15%.  She has wall motion abnormalities and an abnormal stress test.  - She will need left heart catheterization this admission, do today as renal function has improved - cors only w/ staged PCI if needed - Dr Oval Linsey has reviewed the risks and benefits of the procedure with pt son, he agrees to proceed. Pt does not remember discussion of cath risks and benefits but agrees to proceed because her son said that was the best plan.   # Abnormal stress: LHC/RHC planned for today now that renal function has improved -  Continue med rx w/ aspirin, atorvastatin, metoprolol, and heparin.    - She is not a candidate for an ACE inhibitor/ARB due to poor renal function  # Acute on chronic renal failure:  - Still on IVF at 75 cc/hr.  Creatinine peak 3.08, Was 2.3 on admission. - Cr now 2.5, not sure we can get it any lower without worsening volume status   # Dementia:  Patient was living alone at baseline.  Care management has been contacted and she will need planning for discharge.  Needs outpatient work up.  - son is local and involved in her care - she is refusing [facility] - PT/OT have seen and no acute needs.  For questions or updates, please contact [place] Please consult [email or web address] for contact info under Cardiology/STEMI.      Signed, Rosaria Ferries, PA-C  03/28/2018, 7:58 AM

## 2018-03-28 NOTE — Progress Notes (Signed)
We are canceling the cardiac catheterization on Betty [name] today.  She was prepped and draped and I was about to obtain access when she ripped the drape off of her and set up and said I do not want any of this done.  She clearly has an element of dementia/OBS .  I discussed this with her son.  She has an EF of 15% with a high risk Myoview.  At this point, before any invasive tests are performed her mental status will need to be assessed and goals of care addressed.  I have communicated this with her attending physician, Dr. Oval Linsey.   [name], M.D., [place], [facility], [place] [address] [place], [place]  [address]  [phone] 03/28/2018 2:28 PM

## 2018-03-28 NOTE — Consult Note (Signed)
            [facility] CM Primary Care Navigator  03/28/2018  Betty [name] 1946/06/15 [id number]   Went to patient's room and attempted to see patient and family at the bedside to identify possible discharge needs but RN requested not to see patient for now since she is agitated and confused (on and off) and they are trying to calm her down.  Per MD note, cardiac catheterization scheduled today was cancelled since patient had ripped the drape off of her and set up, and stated that she does not want any of this done.   Will attempt to see patient at another time when she is ready and available.    Addendum:   Able to talk withpatient's son Chrissie Noa) outside the room to identify possible discharge needs, while waiting for cardiologist to meet with him. Sonreportsthat patient was having "fast heart beats and shortness of breath" that had led to this admission.(acute systolic and diastolic heart failure and NSTEMI- non ST elevated myocardial infarction).  Patient's sonendorsesDr. Josetta Huddle with [facility].   [facility] [address] [facility].   Son reports that patient has been managing her own medications at home using "pill box" system filled once a week.Her son had recognized patient's possible need in managing her medications at home. Made aware to seek referral to [facility] from primary care provider for medication management if needed in the future.  Son statesthat patient has been driving prior to admission but he or patient's brother Kerry Dory) will be providingtransportation toher doctors'appointments after discharge.  Patientis living at home alone. Son acknowledged the need to have someone assisting patient at home and he verbalized that family members will have to take turns in taking care of her needsonce she is discharged.  Anticipated discharge  plan ishomewith home health services (refused services per Inpatient CM). Per son, plan for discharge is unsure with this confusion and behavior (new) that patient is manifesting.  Son voiced understanding to call primary care provider's office when patientreturns home for a post discharge follow-up visit within1- 2 weeksor sooner if needs arise.Patient letter (with PCP's contact number) was provided asareminder.  Explained topatient's sonregardingTHN CM services available for health management (mainly HF) and resourcesat home. Since discharge disposition is still unsure,  patient'sson was [facility] and had expressedunderstanding to seek referral to [facility] care managementfrom primary care provider ifdeemed necessary andappropriatefor any services in the nearfuture.   [facility] care management information provided for future needs that patient may have.  Primary care provider's office is listed as providing transition of care (TOC) follow-up.   For additional questions please contact:  Edwena Felty A. Yoland Scherr, BSN, RN-BC [facility] PRIMARY CARE Navigator Cell: [phone]

## 2018-03-29 DIAGNOSIS — F0789 Other personality and behavioral disorders due to known physiological condition: Secondary | ICD-10-CM

## 2018-03-29 DIAGNOSIS — E785 Hyperlipidemia, unspecified: Secondary | ICD-10-CM

## 2018-03-29 DIAGNOSIS — F09 Unspecified mental disorder due to known physiological condition: Secondary | ICD-10-CM

## 2018-03-29 DIAGNOSIS — J449 Chronic obstructive pulmonary disease, unspecified: Secondary | ICD-10-CM

## 2018-03-29 DIAGNOSIS — I129 Hypertensive chronic kidney disease with stage 1 through stage 4 chronic kidney disease, or unspecified chronic kidney disease: Secondary | ICD-10-CM

## 2018-03-29 DIAGNOSIS — R413 Other amnesia: Secondary | ICD-10-CM

## 2018-03-29 DIAGNOSIS — Z87891 Personal history of nicotine dependence: Secondary | ICD-10-CM

## 2018-03-29 LAB — BRAIN NATRIURETIC PEPTIDE: B Natriuretic Peptide: >4500 pg/mL — ABNORMAL HIGH (ref 0.0–100.0)

## 2018-03-29 LAB — CBC WITH DIFFERENTIAL/PLATELET
Abs Immature Granulocytes: 0 10*3/uL (ref 0.0–0.1)
Basophils Absolute: 0 10*3/uL (ref 0.0–0.1)
Basophils Relative: 0 %
EOS ABS: 0 10*3/uL (ref 0.0–0.7)
EOS PCT: 0 %
HCT: 32.7 % — ABNORMAL LOW (ref 36.0–46.0)
Hemoglobin: 10.9 g/dL — ABNORMAL LOW (ref 12.0–15.0)
Immature Granulocytes: 0 %
Lymphocytes Relative: 18 %
Lymphs Abs: 1.5 10*3/uL (ref 0.7–4.0)
MCH: 29.9 pg (ref 26.0–34.0)
MCHC: 33.3 g/dL (ref 30.0–36.0)
MCV: 89.6 fL (ref 78.0–100.0)
MONO ABS: 0.4 10*3/uL (ref 0.1–1.0)
MONOS PCT: 5 %
NEUTROS PCT: 77 %
Neutro Abs: 6.5 10*3/uL (ref 1.7–7.7)
Platelets: 300 10*3/uL (ref 150–400)
RBC: 3.65 MIL/uL — ABNORMAL LOW (ref 3.87–5.11)
RDW: 13.6 % (ref 11.5–15.5)
WBC: 8.5 10*3/uL (ref 4.0–10.5)

## 2018-03-29 LAB — CBC
HEMATOCRIT: 32.3 % — AB (ref 36.0–46.0)
HEMOGLOBIN: 10.7 g/dL — AB (ref 12.0–15.0)
MCH: 29.9 pg (ref 26.0–34.0)
MCHC: 33.1 g/dL (ref 30.0–36.0)
MCV: 90.2 fL (ref 78.0–100.0)
Platelets: 311 10*3/uL (ref 150–400)
RBC: 3.58 MIL/uL — ABNORMAL LOW (ref 3.87–5.11)
RDW: 13.5 % (ref 11.5–15.5)
WBC: 9.2 10*3/uL (ref 4.0–10.5)

## 2018-03-29 LAB — BASIC METABOLIC PANEL
Anion gap: 9 (ref 5–15)
BUN: 36 mg/dL — AB (ref 6–20)
CALCIUM: 9.2 mg/dL (ref 8.9–10.3)
CO2: 18 mmol/L — AB (ref 22–32)
CREATININE: 2.13 mg/dL — AB (ref 0.44–1.00)
Chloride: 116 mmol/L — ABNORMAL HIGH (ref 101–111)
GFR calc non Af Amer: 22 mL/min — ABNORMAL LOW (ref >60)
GFR, EST AFRICAN AMERICAN: 25 mL/min — AB (ref >60)
GLUCOSE: 147 mg/dL — AB (ref 65–99)
Potassium: 4.2 mmol/L (ref 3.5–5.1)
Sodium: 143 mmol/L (ref 135–145)

## 2018-03-29 LAB — HEPARIN LEVEL (UNFRACTIONATED): Heparin Unfractionated: <0.1 IU/mL — ABNORMAL LOW (ref 0.30–0.70)

## 2018-03-29 MED ORDER — QUETIAPINE FUMARATE 25 MG PO TABS
25.0000 mg | ORAL_TABLET | Freq: Every day | ORAL | Status: DC
  Administered 2018-03-29 – 2018-04-01 (×4): 25 mg via ORAL
  Filled 2018-03-29 (×4): qty 1

## 2018-03-29 MED ORDER — ISOSORB DINITRATE-HYDRALAZINE 20-37.5 MG PO TABS
1.0000 | ORAL_TABLET | Freq: Two times a day (BID) | ORAL | Status: DC
  Administered 2018-03-29 – 2018-04-02 (×9): 1 via ORAL
  Filled 2018-03-29 (×9): qty 1

## 2018-03-29 NOTE — Evaluation (Signed)
Physical Therapy Evaluation Patient Details Name: Betty [name] MRN: [id number] DOB: 01-09-1946 Today's Date: 03/29/2018   History of Present Illness  Pt adm with NSTEMI and acute systolic and diastolic heart failure. PMH - lung ca, copd, dementia, ckd, htn. Was eval'd by PT on 5/23 and ambulated >400' but then had cognitive change and PT reordered  Clinical Impression  Pt admitted with above diagnosis. Pt currently with functional limitations due to the deficits listed below (see PT Problem List). Pt re-evaluated today after change in mental status over past 2 days. Pt following commands today but not oriented to situation or time and with decreased insight into deficits. Pt stood with min A but asked to sit after walking only a few steps from bed. Pt with 2/4 DOE on RA though O2 sats 95%. Unsteady in standing with need for min A. At this point, changing recommendation to SNF upon d/c.  Pt will benefit from skilled PT to increase their independence and safety with mobility to allow discharge to the venue listed below.       Follow Up Recommendations SNF;Supervision/Assistance - 24 hour    Equipment Recommendations  None recommended by PT    Recommendations for Other Services       Precautions / Restrictions Precautions Precautions: Fall Restrictions Weight Bearing Restrictions: No      Mobility  Bed Mobility Overal bed mobility: Needs Assistance Bed Mobility: Supine to Sit     Supine to sit: Supervision     General bed mobility comments: O2 removed for mobility because pt reports she does not usually wear it at home. SOB with bed mobility, O2 sats 94% on RA but 2/4 DOE  Transfers Overall transfer level: Needs assistance Equipment used: 1 person hand held assist Transfers: Sit to/from Stand Sit to Stand: Min assist         General transfer comment: pt mildly unsteady, min A  Ambulation/Gait             General Gait Details: pt refused ambulation more than 2'.  requested to sit as soon as she got away from bed.   Stairs            Wheelchair Mobility    Modified Rankin (Stroke Patients Only)       Balance Overall balance assessment: Needs assistance Sitting-balance support: No upper extremity supported Sitting balance-Leahy Scale: Good     Standing balance support: Single extremity supported Standing balance-Leahy Scale: Fair                               Pertinent Vitals/Pain Pain Assessment: No/denies pain    Home Living Family/patient expects to be discharged to:: Private residence Living Arrangements: Alone Available Help at Discharge: Family;Available PRN/intermittently Type of Home: House       Home Layout: One level Home Equipment: Cane - single point;Walker - 2 wheels Additional Comments: pt reports she could live with her son but she doesn't want to. Family not present to verify    Prior Function Level of Independence: Independent         Comments: Reports 1 fall 2 weeks ago of unknown etiology     Hand Dominance        Extremity/Trunk Assessment   Upper Extremity Assessment Upper Extremity Assessment: Defer to OT evaluation    Lower Extremity Assessment Lower Extremity Assessment: Generalized weakness    Cervical / Trunk Assessment Cervical / Trunk Assessment: Normal  Communication   Communication: No difficulties  Cognition Arousal/Alertness: Awake/alert Behavior During Therapy: WFL for tasks assessed/performed Overall Cognitive Status: Impaired/Different from baseline Area of Impairment: Memory;Safety/judgement;Problem solving                     Memory: Decreased short-term memory   Safety/Judgement: Decreased awareness of deficits;Decreased awareness of safety   Problem Solving: Slow processing;Requires verbal cues General Comments: pt oriented to self and place but not time or situation. Confabulates at times. Decreased insight into defictis and limitations       General Comments General comments (skin integrity, edema, etc.): pt had been nauseous all morning and reported still not feeling well    Exercises     Assessment/Plan    PT Assessment Patient needs continued PT services  PT Problem List Decreased strength;Decreased activity tolerance;Decreased balance;Decreased mobility;Decreased cognition;Decreased knowledge of use of DME;Decreased knowledge of precautions;Cardiopulmonary status limiting activity       PT Treatment Interventions DME instruction;Gait training;Functional mobility training;Therapeutic activities;Therapeutic exercise;Balance training;Cognitive remediation;Patient/family education    PT Goals (Current goals can be found in the Care Plan section)  Acute Rehab PT Goals Patient Stated Goal: to go home PT Goal Formulation: With patient Time For Goal Achievement: 04/12/18 Potential to Achieve Goals: Good    Frequency Min 2X/week   Barriers to discharge Decreased caregiver support      Co-evaluation               AM-PAC PT "6 Clicks" Daily Activity  Outcome Measure Difficulty turning over in bed (including adjusting bedclothes, sheets and blankets)?: None Difficulty moving from lying on back to sitting on the side of the bed? : None Difficulty sitting down on and standing up from a chair with arms (e.g., wheelchair, bedside commode, etc,.)?: Unable Help needed moving to and from a bed to chair (including a wheelchair)?: A Little Help needed walking in hospital room?: A Little Help needed climbing 3-5 steps with a railing? : A Little 6 Click Score: 18    End of Session Equipment Utilized During Treatment: Oxygen Activity Tolerance: Patient tolerated treatment well Patient left: in chair;with call bell/phone within reach;with chair alarm set Nurse Communication: Mobility status PT Visit Diagnosis: Unsteadiness on feet (R26.81);Other abnormalities of gait and mobility (R26.89)    Time: 2122-4825 PT  Time Calculation (min) (ACUTE ONLY): 12 min   Charges:   PT Evaluation $PT Re-evaluation: 1 Re-eval     PT G Codes:        Leighton Roach, PT  Acute Rehab Services  [place] 03/29/2018, 3:46 PM

## 2018-03-29 NOTE — Progress Notes (Signed)
Patient noted with increased confusion this shift. She pulled out her IV, and was climbing out of bed. PRN Ativan 0.5 mg given was effective. Patient is currently in bed with eyes closed. Will continue to monitor.

## 2018-03-29 NOTE — Progress Notes (Signed)
Not on IV heparin did receive from the 21st to 24th.  This could cover the NSTEMI.  Pt recently pulled IV out,  I will not restart heparin --MD should be rounding and will make final decision

## 2018-03-29 NOTE — Consult Note (Signed)
[facility] Consult   Reason for Consult:  ''Dementia, mood imbalance, decision making capacity'' Referring Physician:  Dr. Jimmye Norman Patient Identification: Betty [name] MRN:  [id number] Principal Diagnosis: NSTEMI (non-ST elevated myocardial infarction) [facility]) Diagnosis:   Patient Active Problem List   Diagnosis Date Noted  . 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 NSTEMI (non-ST elevated myocardial infarction) ([place]) [I21.4] 03/24/2018  . 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 Pure hypercholesterolemia [E78.00]   . CKD (chronic kidney disease), stage IV ([place]) [N18.4]   . Non-small cell carcinoma of right lung, stage 1 ([place]) [C34.91] 05/17/2015  . S/P lobectomy of lung [Z90.2] 04/29/2015    Total Time spent with patient: 1 hour  Subjective:   MARQUISHA NIKOLOV is a 72 y.o. female patient admitted with shortness of breath, chest pain and heart failure.  HPI: Patient with history of lung cancer s/p chemo (carboplatin and gemcitabine) and lobectomy, prior tobacco abuse, CKD IV, COPD, hypertension and hyperlipidemia who was admitted due to heart failure and shortness of breath. Patient gives minimal history but gave permission to be interviewed in the presence of her adult son. Patient's son reports that patient cares for herself at baseline prior to this admission but she has been getting increasingly confused, combative and agitated. Patient refused heart cath yesterday in the middle of the procedure and had no recollection of the event when asked today. Patient reports that she has been struggling with her memory lately. She is alert, oriented to person but disoriented to place and time. Today, she thinks she is somewhere in [place] for her cousins wedding and insisted she is not in the hospital. She score 16/30 on Mini mental status examination. Her son reports that she has not been formally evaluated by a  Neurologist for Dementia.  Past Psychiatric History: None reported  Risk to Self: Is patient at risk for suicide?: No Risk to Others:   Prior Inpatient Therapy:   Prior Outpatient Therapy:    Past Medical History:  Past Medical History:  Diagnosis Date  . Acute mastitis of right breast    10/2003  . Arthritis    bursitis in right shoulder  . Chronic kidney disease    CKD stage 3 04/28/15  . Colon polyps   . 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 Family history of breast cancer   . GERD (gastroesophageal reflux disease)   . Hypercholesterolemia   . Hypertension   . NSCL ca dx'd 03/2015   chemo; RL Lobectomy  . Sciatica of right side   . Seasonal allergic rhinitis   . Tobacco dependence   . Tubular adenoma of colon   . Vitamin D deficiency     Past Surgical History:  Procedure Laterality Date  . BREAST SURGERY     small mass removed from left breast--benign  . CRYO INTERCOSTAL NERVE BLOCK Right 04/29/2015   Procedure: CRYO INTERCOSTAL NERVE BLOCK;  Surgeon: [name], MD;  Location: [facility];  Service: Thoracic;  Laterality: Right;  . LOBECTOMY Right 04/29/2015   Procedure: RIGHT LOWER LUNG LOBECTOMY ;  Surgeon: [name], MD;  Location: [facility];  Service: Thoracic;  Laterality: Right;  . LYMPH NODE DISSECTION Right 04/29/2015   Procedure: RIGHT LUNG LYMPH NODE DISSECTION;  Surgeon: [name], MD;  Location: [facility];  Service: Thoracic;  Laterality: Right;  Marland Kitchen VAGINAL DELIVERY     51 yrs ago  .  VIDEO ASSISTED THORACOSCOPY (VATS)/ LOBECTOMY Right 04/29/2015   Procedure: RIGHT VIDEO ASSISTED THORACOSCOPY (VATS) WEDGE RESECTION/ RIGHT LOWER LOBECTOMY, CRYO-ANALGESIA OF INTERCOSTAL NERVES;  Surgeon: [name], MD;  Location[facility];  Service: Thoracic;  Laterality: Right;   Family History:  Family History  Problem Relation Age of Onset  . Cancer Father   . Cancer Sister   . Heart attack Brother   . Other Son         bicuspid aortic valve   Family Psychiatric  History:  Social History:  Social History   Substance and Sexual Activity  Alcohol Use Yes  . Alcohol/week: 0.0 oz   Comment: rare for special occasions     Social History   Substance and Sexual Activity  Drug Use No    Social History   Socioeconomic History  . Marital status: Single    Spouse name: Not on file  . Number of children: Not on file  . Years of education: Not on file  . Highest education level: Not on file  Occupational History  . Not on file  Social Needs  . Financial resource strain: Not hard at all  . Food insecurity:    Worry: Patient refused    Inability: Patient refused  . Transportation needs:    Medical: Patient refused    Non-medical: Patient refused  Tobacco Use  . Smoking status: Former Smoker    Packs/day: 0.75    Years: 47.00    Pack years: 35.25    Types: Cigarettes    Last attempt to quit: 04/29/2015    Years since quitting: 2.9  . Smokeless tobacco: Never Used  . Tobacco comment: no cigarettes since surgery  Substance and Sexual Activity  . Alcohol use: Yes    Alcohol/week: 0.0 oz    Comment: rare for special occasions  . Drug use: No  . Sexual activity: Not Currently    Birth control/protection: None  Lifestyle  . Physical activity:    Days per week: Patient refused    Minutes per session: Patient refused  . Stress: Only a little  Relationships  . Social connections:    Talks on phone: Patient refused    Gets together: Patient refused    Attends religious service: Patient refused    Active member of club or organization: Patient refused    Attends meetings of clubs or organizations: Patient refused    Relationship status: Patient refused  Other Topics Concern  . Not on file  Social History Narrative  . Not on file   Additional Social History:    Allergies:   Allergies  Allergen Reactions  . Sulfa Antibiotics Itching    Labs:  Results for orders placed or performed  during the hospital encounter of 03/24/18 (from the past 48 hour(s))  Basic metabolic panel     Status: Abnormal   Collection Time: 03/28/18  5:23 AM  Result Value Ref Range   Sodium 143 135 - 145 mmol/L   Potassium 4.1 3.5 - 5.1 mmol/L   Chloride 112 (H) 101 - 111 mmol/L   CO2 21 (L) 22 - 32 mmol/L   Glucose, Bld 102 (H) 65 - 99 mg/dL   BUN 43 (H) 6 - 20 mg/dL   Creatinine, Ser 2.50 (H) 0.44 - 1.00 mg/dL   Calcium 8.9 8.9 - 10.3 mg/dL   GFR calc non Af Amer 18 (L) >60 mL/min   GFR calc Af Amer 21 (L) >60 mL/min    Comment: (NOTE) The  eGFR has been calculated using the CKD EPI equation. This calculation has not been validated in all clinical situations. eGFR's persistently <60 mL/min signify possible Chronic Kidney Disease.    Anion gap 10 5 - 15    Comment: Performed at [facility] [address] [place], [place] [address]  Heparin level (unfractionated)     Status: None   Collection Time: 03/28/18  5:23 AM  Result Value Ref Range   Heparin Unfractionated 0.56 0.30 - 0.70 IU/mL    Comment: (NOTE) If heparin results are below expected values, and patient dosage has  been confirmed, suggest follow up testing of antithrombin III levels. Performed at [facility], [place] [address], [place], [place] [address]   CBC     Status: Abnormal   Collection Time: 03/28/18  5:23 AM  Result Value Ref Range   WBC 8.3 4.0 - 10.5 K/uL   RBC 3.33 (L) 3.87 - 5.11 MIL/uL   Hemoglobin 10.0 (L) 12.0 - 15.0 g/dL   HCT 29.8 (L) 36.0 - 46.0 %   MCV 89.5 78.0 - 100.0 fL   MCH 30.0 26.0 - 34.0 pg   MCHC 33.6 30.0 - 36.0 g/dL   RDW 13.0 11.5 - 15.5 %   Platelets 276 150 - 400 K/uL    Comment: Performed at [facility], [place] [address], [place], [place] [address]  Heparin level (unfractionated)     Status: Abnormal   Collection Time: 03/29/18  4:08 AM  Result Value Ref Range   Heparin Unfractionated <0.10 (L) 0.30 - 0.70 IU/mL    Comment: (NOTE) If heparin results are below  expected values, and patient dosage has  been confirmed, suggest follow up testing of antithrombin III levels. Performed at [facility], [place] [address] [place] [address]   CBC     Status: Abnormal   Collection Time: 03/29/18  4:08 AM  Result Value Ref Range   WBC 9.2 4.0 - 10.5 K/uL   RBC 3.58 (L) 3.87 - 5.11 MIL/uL   Hemoglobin 10.7 (L) 12.0 - 15.0 g/dL   HCT 32.3 (L) 36.0 - 46.0 %   MCV 90.2 78.0 - 100.0 fL   MCH 29.9 26.0 - 34.0 pg   MCHC 33.1 30.0 - 36.0 g/dL   RDW 13.5 11.5 - 15.5 %   Platelets 311 150 - 400 K/uL    Comment: Performed at [facility], [place] [address] [place], [place] [address]  Basic metabolic panel     Status: Abnormal   Collection Time: 03/29/18  4:08 AM  Result Value Ref Range   Sodium 143 135 - 145 mmol/L   Potassium 4.2 3.5 - 5.1 mmol/L   Chloride 116 (H) 101 - 111 mmol/L   CO2 18 (L) 22 - 32 mmol/L   Glucose, Bld 147 (H) 65 - 99 mg/dL   BUN 36 (H) 6 - 20 mg/dL   Creatinine, Ser 2.13 (H) 0.44 - 1.00 mg/dL   Calcium 9.2 8.9 - 10.3 mg/dL   GFR calc non Af Amer 22 (L) >60 mL/min   GFR calc Af Amer 25 (L) >60 mL/min    Comment: (NOTE) The eGFR has been calculated using the CKD EPI equation. This calculation has not been validated in all clinical situations. eGFR's persistently <60 mL/min signify possible Chronic Kidney Disease.    Anion gap 9 5 - 15    Comment: Performed at [facility] [address],  [place], [place] [address]  CBC with Differential/Platelet     Status: Abnormal   Collection Time: 03/29/18 10:45 AM  Result Value Ref Range   WBC 8.5 4.0 - 10.5 K/uL   RBC 3.65 (L) 3.87 - 5.11 MIL/uL   Hemoglobin 10.9 (L) 12.0 - 15.0 g/dL   HCT 32.7 (L) 36.0 - 46.0 %   MCV 89.6 78.0 - 100.0 fL   MCH 29.9 26.0 - 34.0 pg   MCHC 33.3 30.0 - 36.0 g/dL   RDW 13.6 11.5 - 15.5 %   Platelets 300 150 - 400 K/uL   Neutrophils Relative % 77 %   Neutro Abs 6.5 1.7 - 7.7 K/uL   Lymphocytes Relative 18 %   Lymphs Abs 1.5 0.7  - 4.0 K/uL   Monocytes Relative 5 %   Monocytes Absolute 0.4 0.1 - 1.0 K/uL   Eosinophils Relative 0 %   Eosinophils Absolute 0.0 0.0 - 0.7 K/uL   Basophils Relative 0 %   Basophils Absolute 0.0 0.0 - 0.1 K/uL   Immature Granulocytes 0 %   Abs Immature Granulocytes 0.0 0.0 - 0.1 K/uL    Comment: Performed at [facility] [address], [place], [place] [address]  Brain natriuretic peptide     Status: Abnormal   Collection Time: 03/29/18 10:45 AM  Result Value Ref Range   B Natriuretic Peptide >4,500.0 (H) 0.0 - 100.0 pg/mL    Comment: Performed at [facility] [address] [place], [place] [address]    Current Facility-Administered Medications  Medication Dose Route Frequency Provider Last Rate Last Dose  . 0.9 %  sodium chloride infusion  250 mL Intravenous PRN Almyra Deforest, PA      . acetaminophen (TYLENOL) tablet 650 mg  650 mg Oral Q4H PRN Almyra Deforest, PA      . aspirin EC tablet 81 mg  81 mg Oral QHS Almyra Deforest, [place]   81 mg at 03/28/18 2152  . atorvastatin (LIPITOR) tablet 40 mg  40 mg Oral q1800 Skeet Latch, MD   40 mg at 03/28/18 2152  . isosorbide-hydrALAZINE (BIDIL) 20-37.5 MG per tablet 1 tablet  1 tablet Oral BID Jacolyn Reedy, MD   1 tablet at 03/29/18 1358  . LORazepam (ATIVAN) injection 0.5 mg  0.5 mg Intravenous Q4H PRN Barrett, Rhonda G, PA-C   0.5 mg at 03/29/18 0014  . metoprolol succinate (TOPROL-XL) 24 hr tablet 100 mg  100 mg Oral QHS Meng, [place], PA   100 mg at 03/28/18 2152  . multivitamin with minerals tablet 0.5 tablet  0.5 tablet Oral QHS Almyra Deforest, PA   0.5 tablet at 03/28/18 2151  . ondansetron (ZOFRAN) injection 4 mg  4 mg Intravenous Q6H PRN Almyra Deforest, PA   4 mg at 03/25/18 0506  . QUEtiapine (SEROQUEL) tablet 25 mg  25 mg Oral QHS Amauria Younts, MD      . sodium chloride flush (NS) 0.9 % injection 3 mL  3 mL Intravenous Q12H Meng, [place], PA   3 mL at 03/29/18 1040  . sodium chloride flush (NS) 0.9 % injection 3 mL  3 mL Intravenous PRN  Almyra Deforest, PA        Musculoskeletal: Strength & Muscle Tone: within normal limits Gait & Station: unable to stand Patient leans: N/A  Psychiatric Specialty Exam: Physical Exam  Psychiatric: Her speech is normal and behavior is normal. Judgment and thought content normal. Her affect is blunt. Cognition and memory are impaired.  Review of Systems  Constitutional: Positive for malaise/fatigue.  HENT: Negative.   Eyes: Negative.   Respiratory: Positive for shortness of breath.   Cardiovascular: Positive for chest pain.  Gastrointestinal: Negative.   Genitourinary: Negative.   Musculoskeletal: Negative.   Skin: Negative.   Neurological: Negative.   Endo/Heme/Allergies: Negative.   Psychiatric/Behavioral: Positive for memory loss.    Blood pressure 109/60, pulse 92, temperature 98 F (36.7 C), temperature source Oral, resp. rate 18, height _0  (1.626 m), weight 66.9 kg (147 lb 7.8 oz), SpO2 96 %.Body mass index is 25.32 kg/m.  General Appearance: Casual  Eye Contact:  Minimal  Speech:  Clear and Coherent  Volume:  Normal  Mood:  Irritable  Affect:  Constricted  Thought Process:  Disorganized  Orientation:  Other:  only to person  Thought Content:  Illogical  Suicidal Thoughts:  No  Homicidal Thoughts:  No  Memory:  Immediate;   Fair Recent;   Poor Remote;   Poor  Judgement:  Poor  Insight:  Shallow  Psychomotor Activity:  Psychomotor Retardation  Concentration:  Concentration: Fair and Attention Span: Fair  Recall:  Poor  Fund of Knowledge:  Fair  Language:  Good  Akathisia:  No  Handed:  Right  AIMS (if indicated):     Assets:  Financial Resources/Insurance Housing Social Support  ADL's:  Impaired  Cognition:  Impaired,  Moderate  Sleep:        Treatment Plan Summary: 72 year old woman who was admitted due to heart failure and shortness of breath. On admission, she has become more combative, agitated, confused with progressively worsening memory. Patient  has been refusing care and does not thinks she needs medical help. She score 16/30 on mini mental score.  Patient does not have capacity to make medical decision based on my evaluation today.  Plan/Recommendations: -Consider Neurology consult to evaluate patient for Dementia -Consider Brain Imaging to rule out brain metastasis from Lung cancer -Consider referral to social worker, patient may not be able to live alone due to significant deterioration in mental status -Social worker to consider discussing out of home placement with patient's family. -Consider Seroquel 25 mg at bedtime for agitation -Consider Haldol 1 mg po q6 hrs for agitation/aggressive behavior -Avoid Benzodiazepine in a patient with cognitive impairment and confusion. -Patient is cleared by psychiatric service, there is no acute intervention that can be provided at this time.   Disposition: Patient does not meet criteria for psychiatric inpatient admission. Re-consult psych as needed  Corena Pilgrim, MD 03/29/2018 3:47 PM

## 2018-03-29 NOTE — Progress Notes (Signed)
Subjective:  Lying in bed, confused and agitated.  Psych help requested yesterday but has not seen the patient yet.  She refused heart cath yesterday and memory and ability to care for herself is increasingly active question.  Case management has said that the patient can go home with home health and I questioning that also.  Currently receiving IV fluids.  Objective:  Vital Signs in the last 24 hours: BP (!) 146/83 (BP Location: Left Arm)   Pulse 94   Temp 98 F (36.7 C) (Oral)   Resp 18   Ht 5\' 4"  (1.626 m)   Wt 66.9 kg (147 lb 7.8 oz)   SpO2 100%   BMI 25.32 kg/m   Physical Exam: Somewhat agitated confused black female currently in no acute distress not complaining of shortness of breath Lungs:  Clear  Cardiac:  Regular rhythm, normal S1 and S2, no S3 Extremities:  No edema present  Intake/Output from previous day: 05/24 0701 - 05/25 0700 In: 1920 [P.O.:240; I.V.:1680] Out: 500 [Urine:500] Weight Filed Weights   03/27/18 0538 03/28/18 0548 03/29/18 0634  Weight: 62 kg (136 lb 11.2 oz) 62.6 kg (138 lb 1.6 oz) 66.9 kg (147 lb 7.8 oz)    Lab Results: Basic Metabolic Panel: Recent Labs    03/28/18 0523 03/29/18 0408  NA 143 143  K 4.1 4.2  CL 112* 116*  CO2 21* 18*  GLUCOSE 102* 147*  BUN 43* 36*  CREATININE 2.50* 2.13*    CBC: Recent Labs    03/28/18 0523 03/29/18 0408  WBC 8.3 9.2  HGB 10.0* 10.7*  HCT 29.8* 32.3*  MCV 89.5 90.2  PLT 276 311    BNP    Component Value Date/Time   BNP >4,500.0 (H) 03/24/2018 1552   Telemetry: Sinus rhythm with PACs  Assessment/Plan:  1.  Cardiomyopathy 2.  Stage IV chronic kidney disease 3.  Dementia  Recommendations:  Marked deterioration in functional capacity and mental status.  Unclear whether this is due to sedation, sundowning or worsening of underlying dementia while she is in here.  Asked for psych to see the patient again.  She will need a social work consult as well as a case management involvement  to determine best course of care at this point.Not really a good candidate for cardiac intervention at this time.  We'll need aggressive treatment of her cardiomyopathy and after psych assessment figure out what long-term plans are for home and discharge.     Kerry Hough  MD [facility]  03/29/2018, 10:17 AM

## 2018-03-30 LAB — BASIC METABOLIC PANEL
ANION GAP: 10 (ref 5–15)
BUN: 31 mg/dL — ABNORMAL HIGH (ref 6–20)
CO2: 17 mmol/L — AB (ref 22–32)
Calcium: 9.1 mg/dL (ref 8.9–10.3)
Chloride: 116 mmol/L — ABNORMAL HIGH (ref 101–111)
Creatinine, Ser: 2.23 mg/dL — ABNORMAL HIGH (ref 0.44–1.00)
GFR, EST AFRICAN AMERICAN: 24 mL/min — AB (ref >60)
GFR, EST NON AFRICAN AMERICAN: 21 mL/min — AB (ref >60)
Glucose, Bld: 110 mg/dL — ABNORMAL HIGH (ref 65–99)
POTASSIUM: 3.7 mmol/L (ref 3.5–5.1)
Sodium: 143 mmol/L (ref 135–145)

## 2018-03-30 LAB — HEPARIN LEVEL (UNFRACTIONATED)

## 2018-03-30 NOTE — Progress Notes (Addendum)
Subjective:  She is calmer and much more quiet today.  Less agitated.  Sitting up at side of bed. Psychiatry consult is appreciated today.  She thinks that she is in [place] but reoriented her to hospital here.  No complaints of shortness of breath or chest pain.  Objective:  Vital Signs in the last 24 hours: BP 110/66 (BP Location: Left Arm)   Pulse 85   Temp 97.9 F (36.6 C) (Oral)   Resp 17   Ht 5\' 4"  (1.626 m)   Wt 62.5 kg (137 lb 11.2 oz)   SpO2 98%   BMI 23.64 kg/m   Physical Exam: Calm but confused black female currently in no acute distress not complaining of shortness of breath Lungs:  Clear  Cardiac:  Regular rhythm, normal S1 and S2, no S3 Extremities:  No edema present  Intake/Output from previous day: 05/25 0701 - 05/26 0700 In: 240 [P.O.:240] Out: 1400 [Urine:1400] Weight Filed Weights   03/28/18 0548 03/29/18 0634 03/30/18 0401  Weight: 62.6 kg (138 lb 1.6 oz) 66.9 kg (147 lb 7.8 oz) 62.5 kg (137 lb 11.2 oz)    Lab Results: Basic Metabolic Panel: Recent Labs    03/29/18 0408 03/30/18 0354  NA 143 143  K 4.2 3.7  CL 116* 116*  CO2 18* 17*  GLUCOSE 147* 110*  BUN 36* 31*  CREATININE 2.13* 2.23*    CBC: Recent Labs    03/29/18 0408 03/29/18 1045  WBC 9.2 8.5  NEUTROABS  --  6.5  HGB 10.7* 10.9*  HCT 32.3* 32.7*  MCV 90.2 89.6  PLT 311 300    BNP    Component Value Date/Time   BNP >4,500.0 (H) 03/29/2018 1045   Telemetry: Sinus rhythm with PACs  Assessment/Plan:  1.  Cardiomyopathy 2.  Stage IV chronic kidney disease 3.  Dementia  Recommendations:  Titrate medicines for treatment of cardiomyopathy.  IV fluids were cut back yesterday.  Per psychiatry consult will obtain social work consult as well as neurology consult.  She will need to stay in the hospital pending disposition.    Kerry Hough  MD [facility] Card[facility]  03/30/2018, 9:46 AM   Spoke to neurology about consult who stated that this was an outpatient  workup and that they would not come to see the patient.  Mentioned imaging and I discussed in light of renal insufficiency whether to get MRI or CT and they suggested MRI so we will get an MRI.  It looks like disposition in light of her memory issues as this will be the major issue.  Kerry Hough MD [facility]

## 2018-03-31 ENCOUNTER — Encounter (HOSPITAL_COMMUNITY): Payer: Self-pay

## 2018-03-31 DIAGNOSIS — F09 Unspecified mental disorder due to known physiological condition: Secondary | ICD-10-CM

## 2018-03-31 DIAGNOSIS — F0789 Other personality and behavioral disorders due to known physiological condition: Secondary | ICD-10-CM

## 2018-03-31 LAB — BASIC METABOLIC PANEL
Anion gap: 8 (ref 5–15)
BUN: 29 mg/dL — AB (ref 6–20)
CALCIUM: 9.3 mg/dL (ref 8.9–10.3)
CO2: 21 mmol/L — AB (ref 22–32)
CREATININE: 2.37 mg/dL — AB (ref 0.44–1.00)
Chloride: 118 mmol/L — ABNORMAL HIGH (ref 101–111)
GFR calc Af Amer: 22 mL/min — ABNORMAL LOW (ref >60)
GFR, EST NON AFRICAN AMERICAN: 19 mL/min — AB (ref >60)
GLUCOSE: 105 mg/dL — AB (ref 65–99)
Potassium: 3.9 mmol/L (ref 3.5–5.1)
Sodium: 147 mmol/L — ABNORMAL HIGH (ref 135–145)

## 2018-03-31 NOTE — Care Management Important Message (Signed)
Important Message  Patient Details  Name: Betty [name] MRN: [id number] Date of Birth: [birth year]   Medicare Important Message Given:  Yes    [name] 03/31/2018, 12:53 PM

## 2018-03-31 NOTE — Plan of Care (Signed)
VSS. EF 15 %. Unable to tolerate cardiac cath. Medical Mgmt. Denies CP or other c/o. POC discussed with son/family. Verbalize understanding.

## 2018-03-31 NOTE — Progress Notes (Signed)
CSW left voicemail message for son, Chrissie Noa. Awaiting call back to complete assessment. Patient disoriented and determined by psychiatry to not have capacity for decision making.  Estanislado Emms, [place]

## 2018-03-31 NOTE — Progress Notes (Signed)
Occupational Therapy Treatment Patient Details Name: Betty [name] MRN: [id number] DOB: [birth year] Today's Date: 03/31/2018    History of present illness Pt adm with NSTEMI and acute systolic and diastolic heart failure. PMH - lung ca, copd, dementia, ckd, htn. Was eval'd by PT on 5/23 and ambulated >400' but then had cognitive change and PT reordered   OT comments  Progressing steadily, but demonstrates decreased activity tolerance and impaired awareness of deficits. Pt lives alone. Will need 24 hour supervision, updated disposition to SNF.  Follow Up Recommendations  SNF;Supervision/Assistance - 24 hour    Equipment Recommendations       Recommendations for Other Services      Precautions / Restrictions Precautions Precautions: Fall Restrictions Weight Bearing Restrictions: No       Mobility Bed Mobility Overal bed mobility: Independent             General bed mobility comments: HOB flat  Transfers Overall transfer level: Needs assistance Equipment used: 1 person hand held assist;None             General transfer comment: held hand initially, not needed after up a while    Balance Overall balance assessment: Needs assistance   Sitting balance-Leahy Scale: Good     Standing balance support: No upper extremity supported Standing balance-Leahy Scale: Fair                             ADL either performed or assessed with clinical judgement   ADL Overall ADL's : Needs assistance/impaired     Grooming: Oral care;Standing;Supervision/safety           Upper Body Dressing : Set up;Sitting   Lower Body Dressing: Supervision/safety;Sit to/from stand   Toilet Transfer: Supervision/safety;Ambulation;Regular Toilet   Toileting- Water quality scientist and Hygiene: Supervision/safety;Sit to/from stand       Functional mobility during ADLs: Supervision/safety       Vision       Perception     Praxis      Cognition  Arousal/Alertness: Awake/alert Behavior During Therapy: WFL for tasks assessed/performed Overall Cognitive Status: Impaired/Different from baseline Area of Impairment: Memory;Safety/judgement                     Memory: Decreased short-term memory   Safety/Judgement: Decreased awareness of deficits;Decreased awareness of safety     General Comments: pt with decreased insight into deficits and amount of assist she needs for IADL        Exercises     Shoulder Instructions       General Comments      Pertinent Vitals/ Pain       Pain Assessment: No/denies pain  Home Living                                          Prior Functioning/Environment              Frequency  Min 2X/week        Progress Toward Goals  OT Goals(current goals can now be found in the care plan section)  Progress towards OT goals: Progressing toward goals  Acute Rehab OT Goals Patient Stated Goal: to go home OT Goal Formulation: With patient Time For Goal Achievement: 04/11/18 Potential to Achieve Goals: Good  Plan Discharge plan needs to be updated    Co-evaluation  AM-PAC PT "6 Clicks" Daily Activity     Outcome Measure   Help from another person eating meals?: None Help from another person taking care of personal grooming?: A Little Help from another person toileting, which includes using toliet, bedpan, or urinal?: A Little Help from another person bathing (including washing, rinsing, drying)?: A Little Help from another person to put on and taking off regular upper body clothing?: None Help from another person to put on and taking off regular lower body clothing?: A Little 6 Click Score: 20    End of Session Equipment Utilized During Treatment: Gait belt  OT Visit Diagnosis: Other symptoms and signs involving cognitive function;History of falling (Z91.81)   Activity Tolerance Patient tolerated treatment well   Patient Left  in chair;with call bell/phone within reach;with chair alarm set   Nurse Communication          Time: [phone] OT Time Calculation (min): 23 min  Charges: OT General Charges $OT Visit: 1 Visit OT Treatments $Self Care/Home Management : 23-37 mins  03/31/2018 Nestor Lewandowsky, OTR/L Pager: [phone]   Werner Lean Haze Boyden 03/31/2018, 9:37 AM

## 2018-03-31 NOTE — Progress Notes (Signed)
Progress Note  Patient Name: Betty [name] Date of Encounter: 03/31/2018  Primary Cardiologist: Skeet Latch, MD   Subjective   Feeling well.  No chest pain or shortness of breath.  Wonders why she is here.  Wants to know when her sisters are coming.   Inpatient Medications    Scheduled Meds: . aspirin EC  81 mg Oral QHS  . atorvastatin  40 mg Oral q1800  . isosorbide-hydrALAZINE  1 tablet Oral BID  . metoprolol succinate  100 mg Oral QHS  . multivitamin with minerals  0.5 tablet Oral QHS  . QUEtiapine  25 mg Oral QHS  . sodium chloride flush  3 mL Intravenous Q12H   Continuous Infusions: . sodium chloride     PRN Meds: sodium chloride, acetaminophen, LORazepam, ondansetron (ZOFRAN) IV, sodium chloride flush   Vital Signs    Vitals:   03/30/18 1440 03/30/18 1959 03/30/18 2208 03/31/18 0448  BP: (!) 90/40 127/61  112/68  Pulse: 92 93 (!) 101 88  Resp: 20 18  18   Temp: 97.9 F (36.6 C) 98.6 F (37 C)  97.6 F (36.4 C)  TempSrc: Oral Oral  Oral  SpO2: 99% 96%  92%  Weight:    135 lb 5.8 oz (61.4 kg)  Height:        Intake/Output Summary (Last 24 hours) at 03/31/2018 0853 Last data filed at 03/31/2018 0453 Gross per 24 hour  Intake 720 ml  Output 1700 ml  Net -980 ml   Filed Weights   03/29/18 0634 03/30/18 0401 03/31/18 0448  Weight: 147 lb 7.8 oz (66.9 kg) 137 lb 11.2 oz (62.5 kg) 135 lb 5.8 oz (61.4 kg)    Telemetry    Sinus rhythm.  PVCs- Personally Reviewed  ECG    n/a - Personally Reviewed  Physical Exam   VS:  BP 112/68 (BP Location: Left Arm)   Pulse 88   Temp 97.6 F (36.4 C) (Oral)   Resp 18   Ht 5\' 4"  (1.626 m)   Wt 135 lb 5.8 oz (61.4 kg)   SpO2 92%   BMI 23.23 kg/m  , BMI Body mass index is 23.23 kg/m. GENERAL:  Well appearing.  No acute distress.  HEENT: Pupils equal round and reactive, fundi not visualized, oral mucosa unremarkable NECK:  No jugular venous distention, waveform within normal limits, carotid upstroke brisk  and symmetric, no bruits LUNGS:  Clear to auscultation bilaterally HEART:  RRR.  PMI not displaced or sustained,S1 and S2 within normal limits, no S3, no S4, no clicks, no rubs, no murmurs ABD:  Flat, positive bowel sounds normal in frequency in pitch, no bruits, no rebound, no guarding, no midline pulsatile mass, no hepatomegaly, no splenomegaly EXT:  2 plus pulses throughout, no edema, no cyanosis no clubbing SKIN:  No rashes no nodules NEURO:  Cranial nerves II through XII grossly intact, motor grossly intact throughout PSYCH:  Confused.  Oriented to self only.    Labs    Chemistry Recent Labs  Lab 03/29/18 0408 03/30/18 0354 03/31/18 0329  NA 143 143 147*  K 4.2 3.7 3.9  CL 116* 116* 118*  CO2 18* 17* 21*  GLUCOSE 147* 110* 105*  BUN 36* 31* 29*  CREATININE 2.13* 2.23* 2.37*  CALCIUM 9.2 9.1 9.3  GFRNONAA 22* 21* 19*  GFRAA 25* 24* 22*  ANIONGAP 9 10 8      Hematology Recent Labs  Lab 03/28/18 0523 03/29/18 0408 03/29/18 1045  WBC 8.3 9.2 8.5  RBC 3.33* 3.58* 3.65*  HGB 10.0* 10.7* 10.9*  HCT 29.8* 32.3* 32.7*  MCV 89.5 90.2 89.6  MCH 30.0 29.9 29.9  MCHC 33.6 33.1 33.3  RDW 13.0 13.5 13.6  PLT 276 311 300    Cardiac Enzymes Recent Labs  Lab 03/24/18 1927 03/25/18 0216 03/25/18 0726  TROPONINI 0.19* 0.17* 0.19*    Recent Labs  Lab 03/24/18 1617  TROPIPOC 0.16*     BNP Recent Labs  Lab 03/24/18 1552 03/29/18 1045  BNP >4,500.0* >4,500.0*     DDimer No results for input(s): DDIMER in the last 168 hours.   Radiology    No results found.  Cardiac Studies   Echo 03/03/18: Study Conclusions  - Left ventricle: LVEF is severely depressed at approximately 15%   with diffuse hypokinesis; inferior akiensis. The cavity size was   normal. Wall thickness was increased in a pattern of mild LVH. - Mitral valve: There was mild regurgitation. - Pericardium, extracardiac: A trivial pericardial effusion was   identified.  Patient Profile       Betty [name] is a 18F with a history of lung cancer s/p chemo (carboplatin and gemcitabine) and lobectomy, dementia, prior tobacco abuse, CKD IV, COPD, hypertension and hyperlipidemia here with acute systolic and diastolic heart failure and NSTEMI.    Assessment & Plan    # Acute systolic and diastolic heart failure:  LVEF newly diagnosed at 15%.  She has wall motion abnormalities and an abnormal stress test.  Euvolemic.  Renal function stable.  It initially worsened with diuresis despite her BNP of 4500 on admission.  LHC was attempted and she became acutely confused and tried to get off the table.  Cath was aborted and plan is now for medical management.  Continue Bidil and metoprolol.    # Abnormal stress: # Medically managed presumed CAD:  No LHC as above.  Medical management.  Continue aspirin, metoprolol and atorvastatin.   # Acute on chronic renal failure:  Stable.   # Dementia:  Patient was living alone at baseline.  Plan for SNF at discharge.  She was evaluated by Psychiatry and will need outpatient neurology evaluation.  Recommendation for MRI brain to rule out metastatic disease.  We will try, though I'm not sure she can cooperate long enough.  No gadolinium given her renal function.   # Dispo: SNF.  She is medically ready.  SW consult placed.    For questions or updates, please contact [place] Please consult [email or web address] for contact info under Cardiology/STEMI.      Signed, Skeet Latch, MD  03/31/2018, 8:53 AM

## 2018-03-31 NOTE — NC FL2 (Signed)
[place] LEVEL OF CARE SCREENING TOOL     IDENTIFICATION  Patient Name: Betty [name] Birthdate: Oct 31, 1946 Sex: female Admission Date (Current Location): 03/24/2018  [facility] and [place] Number:  Herbalist and Address:  [facility] [place] [address], [place], [place] [address]      Provider Number: [id number]  Attending Physician Name and Address:  Skeet Latch, MD  Relative Name and Phone Number:  Bhumi Godbey, son, [phone]    Current Level of Care: Hospital Recommended Level of Care: [place] Prior Approval Number:    Date Approved/Denied:   PASRR Number: [id number]  Discharge Plan: SNF    Current Diagnoses: Patient Active Problem List   Diagnosis Date Noted  . 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 NSTEMI (non-ST elevated myocardial infarction) ([place]) 03/24/2018  . Acute combined systolic and diastolic (congestive) hrt fail ([place])   . Pure hypercholesterolemia   . CKD (chronic kidney disease), stage IV ([place])   . Non-small cell carcinoma of right lung, stage 1 ([place]) 05/17/2015  . S/P lobectomy of lung 04/29/2015    Orientation RESPIRATION BLADDER Height & Weight     Self, Place  Normal Continent Weight: 135 lb 5.8 oz (61.4 kg) Height:  5\' 4"  (162.6 cm)  BEHAVIORAL SYMPTOMS/MOOD NEUROLOGICAL BOWEL NUTRITION STATUS      Continent Diet  AMBULATORY STATUS COMMUNICATION OF NEEDS Skin   Limited Assist Verbally Normal                       Personal Care Assistance Level of Assistance  Bathing, Feeding, Dressing Bathing Assistance: Limited assistance Feeding assistance: Independent Dressing Assistance: Limited assistance     Functional Limitations Info  Sight, Hearing, Speech Sight Info: Adequate Hearing Info: Adequate Speech Info: Adequate    SPECIAL CARE FACTORS FREQUENCY  PT (By licensed PT), OT (By licensed OT)     PT Frequency: 5x/week OT Frequency:  5x/week            Contractures Contractures Info: Not present    Additional Factors Info  Code Status, Allergies, Psychotropic Code Status Info: Full Allergies Info: Sulfa Antibiotics Psychotropic Info: seroquel         Current Medications (03/31/2018):  This is the current hospital active medication list Current Facility-Administered Medications  Medication Dose Route Frequency Provider Last Rate Last Dose  . 0.9 %  sodium chloride infusion  250 mL Intravenous PRN Almyra Deforest, PA      . acetaminophen (TYLENOL) tablet 650 mg  650 mg Oral Q4H PRN Almyra Deforest, PA      . aspirin EC tablet 81 mg  81 mg Oral QHS Almyra Deforest, [place]   81 mg at 03/30/18 2208  . atorvastatin (LIPITOR) tablet 40 mg  40 mg Oral q1800 Skeet Latch, MD   40 mg at 03/30/18 1718  . isosorbide-hydrALAZINE (BIDIL) 20-37.5 MG per tablet 1 tablet  1 tablet Oral BID Jacolyn Reedy, MD   1 tablet at 03/30/18 2208  . LORazepam (ATIVAN) injection 0.5 mg  0.5 mg Intravenous Q4H PRN Barrett, Rhonda G, PA-C   0.5 mg at 03/29/18 0014  . metoprolol succinate (TOPROL-XL) 24 hr tablet 100 mg  100 mg Oral QHS Almyra Deforest, PA   100 mg at 03/30/18 2208  . multivitamin with minerals tablet 0.5 tablet  0.5 tablet Oral QHS Almyra Deforest, PA   0.5 tablet at 03/30/18 2207  . ondansetron (ZOFRAN) injection 4 mg  4 mg Intravenous Q6H PRN Almyra Deforest, PA   4 mg at 03/25/18 0506  . QUEtiapine (SEROQUEL) tablet 25 mg  25 mg Oral QHS Akintayo, Mojeed, MD   25 mg at 03/30/18 2208  . sodium chloride flush (NS) 0.9 % injection 3 mL  3 mL Intravenous Q12H [place], [place], PA   3 mL at 03/30/18 2209  . sodium chloride flush (NS) 0.9 % injection 3 mL  3 mL Intravenous PRN Almyra Deforest, PA         Discharge Medications: Please see discharge summary for a list of discharge medications.  Relevant Imaging Results:  Relevant Lab Results:   Additional Information SSN: [id number]  Estanislado Emms, LCSW

## 2018-04-01 LAB — BASIC METABOLIC PANEL
ANION GAP: 9 (ref 5–15)
BUN: 26 mg/dL — AB (ref 6–20)
CHLORIDE: 115 mmol/L — AB (ref 101–111)
CO2: 21 mmol/L — ABNORMAL LOW (ref 22–32)
Calcium: 9 mg/dL (ref 8.9–10.3)
Creatinine, Ser: 2.32 mg/dL — ABNORMAL HIGH (ref 0.44–1.00)
GFR calc Af Amer: 23 mL/min — ABNORMAL LOW (ref >60)
GFR calc non Af Amer: 20 mL/min — ABNORMAL LOW (ref >60)
Glucose, Bld: 103 mg/dL — ABNORMAL HIGH (ref 65–99)
POTASSIUM: 3.9 mmol/L (ref 3.5–5.1)
SODIUM: 145 mmol/L (ref 135–145)

## 2018-04-01 NOTE — Progress Notes (Addendum)
Progress Note  Patient Name: Betty [name] Date of Encounter: 04/01/2018  Primary Cardiologist: Skeet Latch, MD   Subjective   No new complaints this morning. Anxious to go home. Does not recall why shes in the hospital. Denies chest pain, SOB, or palpitations  Inpatient Medications    Scheduled Meds: . aspirin EC  81 mg Oral QHS  . atorvastatin  40 mg Oral q1800  . isosorbide-hydrALAZINE  1 tablet Oral BID  . metoprolol succinate  100 mg Oral QHS  . multivitamin with minerals  0.5 tablet Oral QHS  . QUEtiapine  25 mg Oral QHS  . sodium chloride flush  3 mL Intravenous Q12H   Continuous Infusions: . sodium chloride     PRN Meds: sodium chloride, acetaminophen, LORazepam, ondansetron (ZOFRAN) IV, sodium chloride flush   Vital Signs    Vitals:   03/31/18 0448 03/31/18 1638 03/31/18 2044 04/01/18 0635  BP: 112/68 119/71 128/77 (!) 106/58  Pulse: 88 90 90 83  Resp: 18   20  Temp: 97.6 F (36.4 C) 97.7 F (36.5 C) 97.7 F (36.5 C) 97.9 F (36.6 C)  TempSrc: Oral Oral Oral Oral  SpO2: 92% 98% 96% 98%  Weight: 135 lb 5.8 oz (61.4 kg)   135 lb 9.6 oz (61.5 kg)  Height:        Intake/Output Summary (Last 24 hours) at 04/01/2018 0856 Last data filed at 03/31/2018 2000 Gross per 24 hour  Intake 360 ml  Output 400 ml  Net -40 ml   Filed Weights   03/30/18 0401 03/31/18 0448 04/01/18 0635  Weight: 137 lb 11.2 oz (62.5 kg) 135 lb 5.8 oz (61.4 kg) 135 lb 9.6 oz (61.5 kg)    Telemetry    Sinus rhythm - Personally Reviewed  Physical Exam   GEN: Sitting on the edge of her hospital bed in no acute distress.   Neck: No JVD, no carotid bruits Cardiac: RRR, no murmurs, rubs, or gallops.  Respiratory: Clear to auscultation bilaterally, no wheezes/ rales/ rhonchi GI: NABS, Soft, nontender, non-distended  MS: No edema; No deformity. Neuro:  Nonfocal, moving all extremities spontaneously Psych: Normal affect   Labs    Chemistry Recent Labs  Lab 03/30/18 0354  03/31/18 0329 04/01/18 0727  NA 143 147* 145  K 3.7 3.9 3.9  CL 116* 118* 115*  CO2 17* 21* 21*  GLUCOSE 110* 105* 103*  BUN 31* 29* 26*  CREATININE 2.23* 2.37* 2.32*  CALCIUM 9.1 9.3 9.0  GFRNONAA 21* 19* 20*  GFRAA 24* 22* 23*  ANIONGAP 10 8 9      Hematology Recent Labs  Lab 03/28/18 0523 03/29/18 0408 03/29/18 1045  WBC 8.3 9.2 8.5  RBC 3.33* 3.58* 3.65*  HGB 10.0* 10.7* 10.9*  HCT 29.8* 32.3* 32.7*  MCV 89.5 90.2 89.6  MCH 30.0 29.9 29.9  MCHC 33.6 33.1 33.3  RDW 13.0 13.5 13.6  PLT 276 311 300    Cardiac EnzymesNo results for input(s): TROPONINI in the last 168 hours. No results for input(s): TROPIPOC in the last 168 hours.   BNP Recent Labs  Lab 03/29/18 1045  BNP >4,500.0*     DDimer No results for input(s): DDIMER in the last 168 hours.   Radiology    No results found.  Cardiac Studies   Echo 03/03/18: Study Conclusions  - Left ventricle: LVEF is severely depressed at approximately 15%   with diffuse hypokinesis; inferior akiensis. The cavity size was   normal. Wall thickness was increased  in a pattern of mild LVH. - Mitral valve: There was mild regurgitation. - Pericardium, extracardiac: A trivial pericardial effusion was   identified.  NST 03/25/18:  There was no ST segment deviation noted during stress.  Findings consistent with ischemia.  This is a high risk study.  The left ventricular ejection fraction is severely decreased (<30%).   Severe LVE with diffuse hypokinesis inferior basal dyskinesis EF 17% Poor quality study with stress images having diffusely less counts than rest Suggestion of septal apical distal anterior wall and inferior wall ischemia   Patient Profile     72 y.o. female with PMH of HTN, HLD, lung cancer s/p chemo (carboplatin and gemcitabine) and lobectomy, dementia, prior tobacco abuse, CKD stage IV, COPD, found to have new onset acute combined CHF and NSTEMI.   Assessment & Plan    1. Acute combined  CHF: BNP 4500. Cr worsened with gentle diuresis. EF 15% on echo this admission, also with wall motion abnormalities and an abnormal NST. LHC attempted, however aborted after patient became acutely agitated and confused. Plan for medical management. She is euvolemic on exam.  - Continue bidil and metoprolol  2. Presumed CAD: patient with abnormal stress test with severe LVE with diffuse hypokinesis and inferior basal dyskinesis. LHC attempted, however aborted after patient became acutely agitated and confused. Plan for medical management - Continue ASA, metoprolol, and atorvastatin  3. Acute on chronic renal failure: Cr bumped with diuresis attempt. Stable today - Continue to monitor Cr  4. Dementia: patient was living alone prior to admission. Evaluated by Psychiatry and deemed to not have capacity to participate in discharge planning. She was also recommended to have a neurology evaluation and brain MRI.  - Planning for d/c to SNF when bed available. - Could consider inpatient vs outpatient neurology consult. - Not likely to cooperate with MRI brain; would need pre-medication     For questions or updates, please contact [place] Please consult [email or web address] for contact info under Cardiology/STEMI.      Signed, Abigail Butts, PA-C  04/01/2018, 8:56 AM   [phone]  Personally seen and examined. Agree with above.  She has no complaints currently.  Sitting comfortably in chair, no signs of shortness of breath, no chest pain.  Exam: Pleasant, states that she does not know it is going on but she is happy, lungs are clear, no edema, heart regular rate and rhythm  Lab work reviewed.  Stable creatinine.  Assessment and plan:  New onset dilated cardiomyopathy Dementia  - Could not tolerate cardiac catheterization due to agitation confusion anxiety.  Had long discussion with son, first do no harm, I would not put her through anesthesia, ventilator management to obtain this study  with the potential risks involved.  Since she is not having any chest discomfort, no shortness of breath currently, we will continue with aggressive medical management.  Explained to him the different medications and what they do in helping her overall condition.  Of course, her ejection fraction may not improve but clinically she is stable for discharge.  Currently he is quite overwhelmed with the choices and decisions that he has to make.  She used to live alone.  He does state that she was not nearly as confused at home previously.  He understands sundowning.  He talked to the psychiatrist yesterday.  It sounds like they recommended an MRI of the brain.  I am unsure if she is going to be able to comply with this secondary to  agitation but I guess it is worth a try.  I do not want overmedicate her however as this may lead to worsened response.  I spoke with clinical social work.  Candee Furbish, MD

## 2018-04-01 NOTE — Plan of Care (Signed)
  Problem: Education: Goal: Knowledge of General Education information will improve Outcome: Progressing   Problem: Safety: Goal: Ability to remain free from injury will improve Outcome: Progressing   Problem: Activity: Goal: Risk for activity intolerance will decrease Outcome: Completed/Met

## 2018-04-01 NOTE — Plan of Care (Signed)
  Problem: Clinical Measurements: Goal: Will remain free from infection Outcome: Progressing Note:  No s/s of infection noted.   Problem: Clinical Measurements: Goal: Respiratory complications will improve Note:  No s/s of respiratory complications.  Saturating well on room air. Goal: Cardiovascular complication will be avoided Note:  No s/s of cardiovascular complications.

## 2018-04-01 NOTE — Clinical Social Work Note (Signed)
Clinical Social Work Assessment  Patient Details  Name: Betty [name] MRN: [id number] Date of Birth: 03-Jun-1946  Date of referral:  04/01/18               Reason for consult:  Facility Placement, Discharge Planning                Permission sought to share information with:  Facility Sport and exercise psychologist, Family Supports Permission granted to share information::  No(patient disoriented and deemed not to have capacity)  Name::     [name]  Agency::  SNFs  Relationship::  son  Contact Information:  [phone]  Housing/Transportation Living arrangements for the past 2 months:  Single Family Home Source of Information:  Adult Children Patient Interpreter Needed:  None Criminal Activity/Legal Involvement Pertinent to Current Situation/Hospitalization:  No - Comment as needed Significant Relationships:  Adult Children, Siblings Lives with:  Self Do you feel safe going back to the place where you live?  Yes Need for family participation in patient care:  Yes (Comment)  Care giving concerns: Patient from home. PT recommending SNF. Psychiatry deemed patient not to have capacity for decision making.   Social Worker assessment / plan: CSW spoke to patient's son, Chrissie Noa, on the phone and introduced self and role. CSW discussed discharge planning and recommendation for SNF. Chrissie Noa indicated he is agreeable to SNF for his mother, but has concerns about her medical condition stating, "She can't leave the hospital with her heart functioning at 15%." Chrissie Noa is on his way to the hospital to meet with medical staff.   CSW sent out initial referrals. Will provide bed offers to son when he arrives at hospital. CSW to follow and support with discharge planning.  Employment status:  Retired Forensic scientist:  [employer] PT Recommendations:  [place] / Referral to community resources:  [place]  Patient/Family's Response to care: Son with concern  about patient's care and condition.  Patient/Family's Understanding of and Emotional Response to Diagnosis, Current Treatment, and Prognosis: Son with concern about patient's condition, though agreeable to SNF.   Emotional Assessment Appearance:  Appears stated age Attitude/Demeanor/Rapport:  Unable to Assess Affect (typically observed):  Unable to Assess Orientation:  Oriented to Self, Oriented to Place Alcohol / Substance use:  Not Applicable Psych involvement (Current and /or in the community):  No (Comment)  Discharge Needs  Concerns to be addressed:  Discharge Planning Concerns, Care Coordination Readmission within the last 30 days:  No Current discharge risk:  Physical Impairment, Cognitively Impaired Barriers to Discharge:  Continued Medical Work up   Estanislado Emms, LCSW 04/01/2018, 8:58 AM

## 2018-04-01 NOTE — Progress Notes (Signed)
CSW met with patient and son and patient's siblings at bedside again. Son had gotten a chance to discuss medical status with MD and he also reviewed SNF offers. Family to continue to review SNF offers and follow up with CSW for choice.   CSW explained that patient doing well with PT and while Medicare may cover 20 days in rehab, patient would likely be discharged from the facility after a few days. Son voiced understanding, though indicated he is agreeable for her to do a few days of rehab at a facility.   CSW also provided list of ALFs in the area for family's consideration for longer term care planning.   Family had questions about home health care options as well. Questions asked and answered.  CSW to follow for disposition planning.  Estanislado Emms, [place]

## 2018-04-01 NOTE — Progress Notes (Addendum)
Physical Therapy Treatment Patient Details Name: Betty [name] MRN: [id number] DOB: [birth year] Today's Date: 04/01/2018    History of Present Illness Pt adm with NSTEMI and acute systolic and diastolic heart failure. PMH - lung ca, copd, dementia, ckd, htn. Was eval'd by PT on 5/23 and ambulated >400' but then had cognitive change and PT reordered    PT Comments    Pt admitted with above diagnosis. Pt currently with functional limitations due to balance and endurance deficits. Pt was able to ambulate in hallway without device with overall good safety.  Only with slight LOB  with challenges to balance.  HR 85-98bpm, 91-94% on RA with DOE 2/4 with activity. Spoke with pt and son regarding mobility and rehab.  They agree that pt will need initial 24 care and SNF is plan with rehab currently. Son wants to speak with doctor and nurse has called MD.  Pt will benefit from skilled PT to increase their independence and safety with mobility to allow discharge to the venue listed below.     Follow Up Recommendations  SNF;Supervision/Assistance - 24 hour(if pt/son decline SNF, HHPT, HHOT and HHnurse)     Equipment Recommendations  None recommended by PT    Recommendations for Other Services       Precautions / Restrictions Precautions Precautions: Fall Restrictions Weight Bearing Restrictions: No    Mobility  Bed Mobility Overal bed mobility: Independent Bed Mobility: Supine to Sit     Supine to sit: Independent     General bed mobility comments: HOB flat  Transfers Overall transfer level: Needs assistance   Transfers: Sit to/from Stand Sit to Stand: Supervision            Ambulation/Gait Ambulation/Gait assistance: Min guard Ambulation Distance (Feet): 575 Feet Assistive device: None Gait Pattern/deviations: WFL(Within Functional Limits)   Gait velocity interpretation: >2.62 ft/sec, indicative of community ambulatory General Gait Details: Pt only lost balance with  challenges to balance.  Pt with improving safety awareness from last visit.    Stairs Stairs: Yes Stairs assistance: Min guard Stair Management: One rail Right;Step to pattern;Forwards Number of Stairs: 3 General stair comments: No difficulty up and down stairs   Wheelchair Mobility    Modified Rankin (Stroke Patients Only)       Balance Overall balance assessment: Needs assistance Sitting-balance support: No upper extremity supported Sitting balance-Leahy Scale: Good     Standing balance support: No upper extremity supported Standing balance-Leahy Scale: Fair Standing balance comment: Pt static balance without difficulty without UE support.  needs support with dynamic activity.                  Standardized Balance Assessment Standardized Balance Assessment : Dynamic Gait Index   Dynamic Gait Index Level Surface: Normal Change in Gait Speed: Normal Gait with Horizontal Head Turns: Mild Impairment Gait with Vertical Head Turns: Mild Impairment Gait and Pivot Turn: Mild Impairment Step Over Obstacle: Mild Impairment Step Around Obstacles: Normal Steps: Moderate Impairment Total Score: 18      Cognition Arousal/Alertness: Awake/alert Behavior During Therapy: WFL for tasks assessed/performed Overall Cognitive Status: Impaired/Different from baseline Area of Impairment: Memory;Safety/judgement                     Memory: Decreased short-term memory   Safety/Judgement: Decreased awareness of deficits;Decreased awareness of safety     General Comments: Pt son concerned about her cognition as he states she has not had sundowning and cognitive issues prior to admit.  Discussed concerns with safety and why PT recommends SNF or 24 hour care initially and son in agreement .      Exercises General Exercises - Lower Extremity Ankle Circles/Pumps: AROM;Both;10 reps    General Comments General comments (skin integrity, edema, etc.): Scored 18/24 on DGI  suggesting at risk for falls without device.  Discussed with son that if pt goes home she may want to use RW initially.       Pertinent Vitals/Pain Pain Assessment: No/denies pain    Home Living                      Prior Function            PT Goals (current goals can now be found in the care plan section) Acute Rehab PT Goals Patient Stated Goal: to go home Progress towards PT goals: Progressing toward goals    Frequency    Min 2X/week      PT Plan Current plan remains appropriate    Co-evaluation              AM-PAC PT "6 Clicks" Daily Activity  Outcome Measure  Difficulty turning over in bed (including adjusting bedclothes, sheets and blankets)?: None Difficulty moving from lying on back to sitting on the side of the bed? : None Difficulty sitting down on and standing up from a chair with arms (e.g., wheelchair, bedside commode, etc,.)?: None Help needed moving to and from a bed to chair (including a wheelchair)?: A Little Help needed walking in hospital room?: A Little Help needed climbing 3-5 steps with a railing? : A Little 6 Click Score: 21    End of Session Equipment Utilized During Treatment: Gait belt Activity Tolerance: Patient tolerated treatment well Patient left: with call bell/phone within reach;in bed;with family/visitor present(sitting on EOB) Nurse Communication: Mobility status PT Visit Diagnosis: Unsteadiness on feet (R26.81);Other abnormalities of gait and mobility (R26.89)     Time: 1610-9604 PT Time Calculation (min) (ACUTE ONLY): 28 min  Charges:  $Gait Training: 23-37 mins                    G Codes:       Betty [name],PT Acute Rehabilitation [phone] (pager)    Denice Paradise 04/01/2018, 12:11 PM

## 2018-04-02 DIAGNOSIS — I248 Other forms of acute ischemic heart disease: Secondary | ICD-10-CM

## 2018-04-02 LAB — BASIC METABOLIC PANEL
Anion gap: 12 (ref 5–15)
BUN: 34 mg/dL — AB (ref 6–20)
CALCIUM: 8.8 mg/dL — AB (ref 8.9–10.3)
CO2: 20 mmol/L — ABNORMAL LOW (ref 22–32)
CREATININE: 2.84 mg/dL — AB (ref 0.44–1.00)
Chloride: 113 mmol/L — ABNORMAL HIGH (ref 101–111)
GFR calc Af Amer: 18 mL/min — ABNORMAL LOW (ref >60)
GFR, EST NON AFRICAN AMERICAN: 15 mL/min — AB (ref >60)
Glucose, Bld: 103 mg/dL — ABNORMAL HIGH (ref 65–99)
POTASSIUM: 4.1 mmol/L (ref 3.5–5.1)
SODIUM: 145 mmol/L (ref 135–145)

## 2018-04-02 MED ORDER — QUETIAPINE FUMARATE 25 MG PO TABS: 25.0000 mg | ORAL_TABLET | Freq: Every day | ORAL | 3 refills | Status: DC

## 2018-04-02 MED ORDER — ISOSORB DINITRATE-HYDRALAZINE 20-37.5 MG PO TABS: 1.0000 | ORAL_TABLET | Freq: Two times a day (BID) | ORAL | 3 refills | Status: DC

## 2018-04-02 MED ORDER — ATORVASTATIN CALCIUM 40 MG PO TABS: 40.0000 mg | ORAL_TABLET | Freq: Every day | ORAL | 3 refills | Status: DC

## 2018-04-02 MED ORDER — METOPROLOL SUCCINATE ER 100 MG PO TB24: 100.0000 mg | ORAL_TABLET | Freq: Every day | ORAL | 3 refills | Status: DC

## 2018-04-02 NOTE — Progress Notes (Signed)
CSW spoke with son, Chrissie Noa, on the phone. Son has decided to take patient home when discharged and has declined SNF. Son agreeable to home health services. Did speak to cardiology PA as well about discharge plan. RNCM aware and following for home health needs. CSW signing off as no additional needs identified at this time.  Betty [name], [place]

## 2018-04-02 NOTE — Progress Notes (Signed)
Patient and son received discharge information and acknowledged understanding of it. Patient IV was removed.

## 2018-04-02 NOTE — Discharge Summary (Addendum)
Discharge Summary    Patient ID: Betty [name],  MRN: [id number], DOB/AGE: Apr 04, 1946 72 y.o.  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 Sulfa Antibiotics Itching    Diagnostic Studies/Procedures    Echocardiogram 03/03/18: Study Conclusions  - Left ventricle: LVEF is severely depressed at approximately 15%   with diffuse hypokinesis; inferior akiensis. The cavity size was   normal. Wall thickness was increased in a pattern of mild LVH. - Mitral valve: There was mild regurgitation. - Pericardium, extracardiac: A trivial pericardial effusion was   identified.  NST 03/25/18:  There was no ST segment deviation noted during stress.  Findings consistent with ischemia.  This is a high risk study.  The left ventricular ejection fraction is severely decreased (<30%).  Severe LVE with diffuse hypokinesis inferior basal dyskinesis EF 17% Poor quality study with stress images having diffusely less counts than rest Suggestion of septal apical distal anterior wall and inferior wall ischemia   _____________   History of Present Illness     Betty [name] reported increased shortness of breath for over a month.  She saw her PCP who referred her for an echo 03/03/18 that revealed LVEF 15% with diffuse hypokinesis and inferior akinesis.  Her son is a patient of Dr. Tamala Julian and they requested she be seen by him.  However, they never heard about an initial appointment.  Her son notes that she has been progressively dyspneic over the last month.  She gets  short of breath walking short distances around the house. For the last two days she also notes mild chest pressure.  The pressure is constant and doesn't change with exertion.  She hasn't noted any lower extremity edema and denies orthopnea or PND.  She has no prior cardiac history.  She quit smoking 2 years ago.  She reports one fall 2 weeks ago and is unclear what happened.  However there was no preceding chest pain, palpitations, or dizziness.     Hospital Course     Consultants: SW, case management  1. Acute combined CHF: patient presented with SOB. BNP 4500. Cr worsened with gentle diuresis. EF 15% on echo 03/03/18, also with wall motion abnormalities and an abnormal NST. LHC attempted, however aborted after patient became acutely agitated and confused. Plan for medical management. She is euvolemic on exam.  - Continued bidil and metoprolol  2. NSTEMI in patient with presumed CAD: patient presented with SOB and chest pain. Troponin peaked at 0.19. EKG without ischemic changes. She underwent a NST 521/19 which was abnormal with severe LVE with diffuse hypokinesis and inferior basal dyskinesis. LHC attempted, however aborted after patient became acutely agitated and confused. Plan for medical management - Continue ASA, metoprolol, and atorvastatin  3. Acute on chronic renal failure: Cr bumped with diuresis attempt. Stable at 1.8 on the day of discharge; suspect this is near baseline - Continue routine monitoring outpatient  4. Dementia: patient was living alone prior to admission. Evaluated by Psychiatry and deemed to not have capacity to participate in discharge planning. She was also recommended to have a neurology evaluation and brain  MRI. MRI brain deferred as patient was not cooperative. - Discharged on seroquel 25mg  at bedtime   - Patient/family given Neurology clinic information should they choose to evaluate memory problems further   SW/Case management assisted with facilitating  discharge planning. Initially recommended for SNF, however family decided to take patient home with home RN, HHA, and PT services.    _____________  Discharge Vitals Blood pressure (!) 115/51, pulse 80, temperature 98.1 F (36.7 C), temperature source Oral, resp. rate 18, height 5\' 4"  (1.626 m), weight 135 lb 9.6 oz (61.5 kg), SpO2 97 %.  Filed Weights   03/30/18 0401 03/31/18 0448 04/01/18 0635  Weight: 137 lb 11.2 oz (62.5 kg) 135 lb 5.8 oz (61.4 kg) 135 lb 9.6 oz (61.5 kg)    Labs & Radiologic Studies    CBC No results for input(s): WBC, NEUTROABS, HGB, HCT, MCV, PLT in the last 72 hours. Basic Metabolic Panel Recent Labs    04/01/18 0727 04/02/18 0552  NA 145 145  K 3.9 4.1  CL 115* 113*  CO2 21* 20*  GLUCOSE 103* 103*  BUN 26* 34*  CREATININE 2.32* 2.84*  CALCIUM 9.0 8.8*   Liver Function Tests No results for input(s): AST, ALT, ALKPHOS, BILITOT, PROT, ALBUMIN in the last 72 hours. No results for input(s): LIPASE, AMYLASE in the last 72 hours. Cardiac Enzymes No results for input(s): CKTOTAL, CKMB, CKMBINDEX, TROPONINI in the last 72 hours. BNP Invalid input(s): POCBNP D-Dimer No results for input(s): DDIMER in the last 72 hours. Hemoglobin A1C No results for input(s): HGBA1C in the last 72 hours. Fasting Lipid Panel No results for input(s): CHOL, HDL, LDLCALC, TRIG, CHOLHDL, LDLDIRECT in the last 72 hours. Thyroid Function Tests No results for input(s): TSH, T4TOTAL, T3FREE, THYROIDAB in the last 72 hours.  Invalid input(s): FREET3 _____________  Dg Chest 2 View  Result Date: 03/24/2018 CLINICAL DATA:  Chest pain. EXAM: CHEST - 2 VIEW COMPARISON:  03/05/2018 FINDINGS: There is a small right pleural effusion. There is bilateral mild interstitial thickening. There is no left pleural effusion. There is no pneumothorax. There is no focal consolidation. The heart and mediastinal contours are unremarkable. The osseous structures are unremarkable. IMPRESSION: Small  right pleural effusion which is increased in size compared with the prior exam. Electronically Signed   By: Kathreen Devoid   On: 03/24/2018 16:40   Dg Chest 2 View  Result Date: 03/05/2018 CLINICAL DATA:  72 year old female with shortness of breath, right pleural effusion. EXAM: CHEST - 2 VIEW COMPARISON:  02/19/2018 and earlier. FINDINGS: The right pleural effusion has regressed with no definite residual. Improved right lung base ventilation. No pneumothorax, pulmonary edema or confluent opacity. Stable mild to moderate cardiomegaly. Other mediastinal contours are within normal limits. Visualized tracheal air column is within normal limits. No acute osseous abnormality identified. Negative visible bowel gas pattern. Abdominal aortic atherosclerosis. IMPRESSION: 1. The small right pleural effusion appears resolved. 2. No new No acute cardiopulmonary abnormality. 3.  Aortic Atherosclerosis (ICD10-I70.0). Electronically Signed   By: Genevie Ann M.D.   On: 03/05/2018 16:58   Nm Myocar Multi W/spect W/wall Motion / Ef  Result Date: 03/25/2018  There was no ST segment deviation noted during stress.  Findings consistent with ischemia.  This is a high risk study.  The left ventricular ejection fraction is severely decreased (<30%).  Severe LVE with diffuse hypokinesis inferior basal dyskinesis EF 17% Poor quality study with stress images having diffusely less counts than rest Suggestion of septal apical  distal anterior wall and inferior wall ischemia   Dg Foot Complete Right  Result Date: 03/05/2018 CLINICAL DATA:  72 year old female with pain and swelling across the right foot after falling 1 week previously EXAM: RIGHT FOOT COMPLETE - 3+ VIEW COMPARISON:  None. FINDINGS: There is no evidence of fracture or dislocation. There is no evidence of arthropathy or other focal bone abnormality. Soft tissues are unremarkable. IMPRESSION: Negative. Electronically Signed   By: Jacqulynn Cadet M.D.   On: 03/05/2018 16:56     Disposition   Patient was seen and examined by Dr. Marlou Porch who deemed patient as stable for discharge. Follow-up has been arranged. Discharge medications as listed below.   Follow-up Plans & Appointments    Follow-up Information    Erlene Quan, PA-C Follow up on 05/01/2018.   Specialties:  Cardiology, Radiology Why:  Please arrive 15 minutes early for your 9:00am cardiology appointment Contact information: [address] [place] [address] [phone]            Discharge Medications   Allergies as of 04/02/2018      Reactions   Sulfa Antibiotics Itching      Medication List    STOP taking these medications   furosemide 40 MG tablet Commonly known as:  LASIX     TAKE these medications   acetaminophen 325 MG tablet Commonly known as:  TYLENOL Take 650 mg by mouth every 6 (six) hours as needed for headache (pain). Reported on 03/27/2016   aspirin EC 81 MG tablet Take 81 mg by mouth at bedtime.   atorvastatin 40 MG tablet Commonly known as:  LIPITOR Take 1 tablet (40 mg total) by mouth daily at 6 PM.   isosorbide-hydrALAZINE 20-37.5 MG tablet Commonly known as:  BIDIL Take 1 tablet by mouth 2 (two) times daily.   metoprolol succinate 100 MG 24 hr tablet Commonly known as:  TOPROL-XL Take 1 tablet (100 mg total) by mouth at bedtime. Take with or immediately following a meal. What changed:  additional instructions   multivitamin with minerals Tabs tablet Take 0.5 tablets by mouth at bedtime.   QUEtiapine 25 MG tablet Commonly known as:  SEROQUEL Take 1 tablet (25 mg total) by mouth at bedtime.   VITAMIN D3 PO Take 1 capsule by mouth at bedtime.         Outstanding Labs/Studies   None  Duration of Discharge Encounter   Greater than 30 minutes including physician time.  Signed, Abigail Butts PA-C 04/02/2018, 10:52 AM   Personally seen and examined. Agree with above.  Primary Cardiologist: Skeet Latch, MD    Subjective   No shortness of breath, no chest pain.  Had long discussion with son yesterday.  Appreciate Estanislado Emms with social work further discussing options with him.  Inpatient Medications    Scheduled Meds: . aspirin EC  81 mg Oral QHS  . atorvastatin  40 mg Oral q1800  . isosorbide-hydrALAZINE  1 tablet Oral BID  . metoprolol succinate  100 mg Oral QHS  . multivitamin with minerals  0.5 tablet Oral QHS  . QUEtiapine  25 mg Oral QHS  . sodium chloride flush  3 mL Intravenous Q12H   Continuous Infusions: . sodium chloride     PRN Meds: sodium chloride, acetaminophen, LORazepam, ondansetron (ZOFRAN) IV, sodium chloride flush   Vital Signs          Vitals:   04/01/18 0635 04/01/18 1325 04/01/18 1952 04/02/18 0634  BP: (!) 106/58 Marland Kitchen)  89/51 (!) 102/56 (!) 115/51  Pulse: 83 89 90 80  Resp: 20  18 18   Temp: 97.9 F (36.6 C) (!) 97.5 F (36.4 C) 97.7 F (36.5 C) 98.1 F (36.7 C)  TempSrc: Oral Oral Oral Oral  SpO2: 98% 99% 97% 97%  Weight: 135 lb 9.6 oz (61.5 kg)     Height:        Intake/Output Summary (Last 24 hours) at 04/02/2018 0936 Last data filed at 04/02/2018 1610    Gross per 24 hour  Intake 840 ml  Output -  Net 840 ml        Filed Weights   03/30/18 0401 03/31/18 0448 04/01/18 0635  Weight: 137 lb 11.2 oz (62.5 kg) 135 lb 5.8 oz (61.4 kg) 135 lb 9.6 oz (61.5 kg)    Telemetry    No adverse arrhythmias, sinus rhythm- Personally Reviewed  ECG    Sinus rhythm- Personally Reviewed  Physical Exam   GEN:No acute distress.   Neck:No JVD Cardiac:RRR, no murmurs, rubs, or gallops.  Respiratory:Clear to auscultation bilaterally. RU:EAVW, nontender, non-distended  MS:No edema; No deformity. Neuro:Nonfocal  Psych: Normal affect, quiet.  Admits confusion  Labs    Chemistry LastLabs       Recent Labs  Lab 03/31/18 0329 04/01/18 0727 04/02/18 0552  NA 147* 145 145  K 3.9 3.9 4.1  CL 118* 115* 113*   CO2 21* 21* 20*  GLUCOSE 105* 103* 103*  BUN 29* 26* 34*  CREATININE 2.37* 2.32* 2.84*  CALCIUM 9.3 9.0 8.8*  GFRNONAA 19* 20* 15*  GFRAA 22* 23* 18*  ANIONGAP 8 9 12        Hematology LastLabs       Recent Labs  Lab 03/28/18 0523 03/29/18 0408 03/29/18 1045  WBC 8.3 9.2 8.5  RBC 3.33* 3.58* 3.65*  HGB 10.0* 10.7* 10.9*  HCT 29.8* 32.3* 32.7*  MCV 89.5 90.2 89.6  MCH 30.0 29.9 29.9  MCHC 33.6 33.1 33.3  RDW 13.0 13.5 13.6  PLT 276 311 300      Cardiac Enzymes LastLabs  No results for input(s): TROPONINI in the last 168 hours.    LastLabs  No results for input(s): TROPIPOC in the last 168 hours.     BNP LastLabs  Recent Labs  Lab 03/29/18 1045  BNP >4,500.0*       DDimer  LastLabs  No results for input(s): DDIMER in the last 168 hours.     Radiology    ImagingResults(Last48hours)  No results found.    Cardiac Studies   EF 15%  Nuclear stress test EF 17%, suggestion of septal apical distal anterior wall and inferior wall ischemia.  Unable to comply with heart catheterization due to confusion, agitation  Patient Profile     72 y.o. 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  Continue with metoprolol.  BiDil.  Not on ACE because of creatinine, chronic kidney disease.  Creatinine today 2.84.  Not on diuretic.  Recommend repeating basic metabolic profile in 1 week post discharge.  I am comfortable with discharge from a cardiology perspective.  Placement, skilled nursing facility, discussion with son.  Appreciate social work.  Dementia - Psychiatry  consultation noted.  Likely unable to comply with MRI as she was unable to comply with cardiac catheterization.  I would not want to sedate her, risks likely outweigh benefits.  Coronary artery calcification - Noted on prior CT scan.  Presumed underlying coronary artery disease.  Continue with aggressive medical management.  Aspirin, metoprolol, atorvastatin, BiDil.  Disease stage IV - Creatinine has been ranging from 2.3-3.0, currently 2.8.  No ACE inhibitor, avoid NSAIDs.  Demand ischemia -Low level troponin flat likely because of underlying cardiomyopathy.  Okay with discharge from cardiology perspective.  Awaiting skilled nursing facility placement or home placement with 24-hour care.  For questions or updates, please contact [place] Please consult [email or web address] for contact info under Cardiology/STEMI.      Signed, Candee Furbish, MD

## 2018-04-02 NOTE — Progress Notes (Addendum)
Progress Note  Patient Name: Betty [name] Date of Encounter: 04/02/2018  Primary Cardiologist: Skeet Latch, MD   Subjective   No shortness of breath, no chest pain.  Had long discussion with son yesterday.  Appreciate Estanislado Emms with social work further discussing options with him.  Inpatient Medications    Scheduled Meds: . aspirin EC  81 mg Oral QHS  . atorvastatin  40 mg Oral q1800  . isosorbide-hydrALAZINE  1 tablet Oral BID  . metoprolol succinate  100 mg Oral QHS  . multivitamin with minerals  0.5 tablet Oral QHS  . QUEtiapine  25 mg Oral QHS  . sodium chloride flush  3 mL Intravenous Q12H   Continuous Infusions: . sodium chloride     PRN Meds: sodium chloride, acetaminophen, LORazepam, ondansetron (ZOFRAN) IV, sodium chloride flush   Vital Signs    Vitals:   04/01/18 0635 04/01/18 1325 04/01/18 1952 04/02/18 0634  BP: (!) 106/58 (!) 89/51 (!) 102/56 (!) 115/51  Pulse: 83 89 90 80  Resp: 20  18 18   Temp: 97.9 F (36.6 C) (!) 97.5 F (36.4 C) 97.7 F (36.5 C) 98.1 F (36.7 C)  TempSrc: Oral Oral Oral Oral  SpO2: 98% 99% 97% 97%  Weight: 135 lb 9.6 oz (61.5 kg)     Height:        Intake/Output Summary (Last 24 hours) at 04/02/2018 0936 Last data filed at 04/02/2018 8250 Gross per 24 hour  Intake 840 ml  Output -  Net 840 ml   Filed Weights   03/30/18 0401 03/31/18 0448 04/01/18 0635  Weight: 137 lb 11.2 oz (62.5 kg) 135 lb 5.8 oz (61.4 kg) 135 lb 9.6 oz (61.5 kg)    Telemetry    No adverse arrhythmias, sinus rhythm- Personally Reviewed  ECG    Sinus rhythm- Personally Reviewed  Physical Exam   GEN: No acute distress.   Neck: No JVD Cardiac: RRR, no murmurs, rubs, or gallops.  Respiratory: Clear to auscultation bilaterally. GI: Soft, nontender, non-distended  MS: No edema; No deformity. Neuro:  Nonfocal  Psych: Normal affect, quiet.  Admits confusion  Labs    Chemistry Recent Labs  Lab 03/31/18 0329 04/01/18 0727  04/02/18 0552  NA 147* 145 145  K 3.9 3.9 4.1  CL 118* 115* 113*  CO2 21* 21* 20*  GLUCOSE 105* 103* 103*  BUN 29* 26* 34*  CREATININE 2.37* 2.32* 2.84*  CALCIUM 9.3 9.0 8.8*  GFRNONAA 19* 20* 15*  GFRAA 22* 23* 18*  ANIONGAP 8 9 12      Hematology Recent Labs  Lab 03/28/18 0523 03/29/18 0408 03/29/18 1045  WBC 8.3 9.2 8.5  RBC 3.33* 3.58* 3.65*  HGB 10.0* 10.7* 10.9*  HCT 29.8* 32.3* 32.7*  MCV 89.5 90.2 89.6  MCH 30.0 29.9 29.9  MCHC 33.6 33.1 33.3  RDW 13.0 13.5 13.6  PLT 276 311 300    Cardiac EnzymesNo results for input(s): TROPONINI in the last 168 hours. No results for input(s): TROPIPOC in the last 168 hours.   BNP Recent Labs  Lab 03/29/18 1045  BNP >4,500.0*     DDimer No results for input(s): DDIMER in the last 168 hours.   Radiology    No results found.  Cardiac Studies   EF 15%  Nuclear stress test EF 17%, suggestion of septal apical distal anterior wall and inferior wall ischemia.  Unable to comply with heart catheterization due to confusion, agitation  Patient Profile     72  y.o. 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  Continue with metoprolol.  BiDil.  Not on ACE because of creatinine, chronic kidney disease.  Creatinine today 2.84.  Not on diuretic.  Recommend repeating basic metabolic profile in 1 week post discharge.  I am comfortable with discharge from a cardiology perspective.  Placement, skilled nursing facility, discussion with son.  Appreciate social work.  Dementia - Psychiatry consultation noted.  Likely unable to comply with MRI as she was unable to comply with  cardiac catheterization.  I would not want to sedate her, risks likely outweigh benefits.  Coronary artery calcification - Noted on prior CT scan.  Presumed underlying coronary artery disease.  Continue with aggressive medical management.  Aspirin, metoprolol, atorvastatin, BiDil.  Disease stage IV - Creatinine has been ranging from 2.3-3.0, currently 2.8.  No ACE inhibitor, avoid NSAIDs.  Demand ischemia -Low level troponin flat likely because of underlying cardiomyopathy.  Okay with discharge from cardiology perspective.  Awaiting skilled nursing facility placement.  For questions or updates, please contact [place] Please consult [email or web address] for contact info under Cardiology/STEMI.      Signed, Candee Furbish, MD  04/02/2018, 9:36 AM

## 2018-04-04 DIAGNOSIS — I509 Heart failure, unspecified: Secondary | ICD-10-CM | POA: Diagnosis not present

## 2018-04-07 DIAGNOSIS — E7800 Pure hypercholesterolemia, unspecified: Secondary | ICD-10-CM | POA: Diagnosis not present

## 2018-04-07 DIAGNOSIS — J439 Emphysema, unspecified: Secondary | ICD-10-CM | POA: Diagnosis not present

## 2018-04-07 DIAGNOSIS — I447 Left bundle-branch block, unspecified: Secondary | ICD-10-CM | POA: Diagnosis not present

## 2018-04-07 DIAGNOSIS — Z85118 Personal history of other malignant neoplasm of bronchus and lung: Secondary | ICD-10-CM | POA: Diagnosis not present

## 2018-04-07 DIAGNOSIS — I130 Hypertensive heart and chronic kidney disease with heart failure and stage 1 through stage 4 chronic kidney disease, or unspecified chronic kidney disease: Secondary | ICD-10-CM | POA: Diagnosis not present

## 2018-04-07 DIAGNOSIS — Z7982 Long term (current) use of aspirin: Secondary | ICD-10-CM | POA: Diagnosis not present

## 2018-04-07 DIAGNOSIS — I5043 Acute on chronic combined systolic (congestive) and diastolic (congestive) heart failure: Secondary | ICD-10-CM | POA: Diagnosis not present

## 2018-04-07 DIAGNOSIS — K219 Gastro-esophageal reflux disease without esophagitis: Secondary | ICD-10-CM | POA: Diagnosis not present

## 2018-04-07 DIAGNOSIS — Z87891 Personal history of nicotine dependence: Secondary | ICD-10-CM | POA: Diagnosis not present

## 2018-04-07 DIAGNOSIS — F0390 Unspecified dementia without behavioral disturbance: Secondary | ICD-10-CM | POA: Diagnosis not present

## 2018-04-07 DIAGNOSIS — I2510 Atherosclerotic heart disease of native coronary artery without angina pectoris: Secondary | ICD-10-CM | POA: Diagnosis not present

## 2018-04-07 DIAGNOSIS — N183 Chronic kidney disease, stage 3 (moderate): Secondary | ICD-10-CM | POA: Diagnosis not present

## 2018-04-07 DIAGNOSIS — I214 Non-ST elevation (NSTEMI) myocardial infarction: Secondary | ICD-10-CM | POA: Diagnosis not present

## 2018-04-08 NOTE — Progress Notes (Signed)
Cardiology Office Note    Date:  04/09/2018   ID:  Betty [name], [place] [birth year], MRN [id number]  PCP:  Josetta Huddle, MD  Cardiologist: Sinclair Grooms, MD   Chief Complaint  Patient presents with  . Coronary Artery Disease  . Congestive Heart Failure    History of Present Illness:  Betty [name] is a 72 y.o. 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  Three-vessel coronary calcification noted on previous CT. hospital echocardiogram demonstrated EF 15%.  She is accompanied by her son, Betty [name], band directive at [employer].  The patient was recently hospitalized after echo demonstrated reduced systolic function.  The echo that identified this was done as an outpatient and while waiting to be seen by cardiology she became acutely ill was hospitalized.  She has acute on chronic systolic heart failure.  Appropriate medical therapy was started, she was diuresed, and developed confused behavior associated with anger and inability to appropriately follow-up commands.  She has developed cognitive impairment slowly over time.  No prior history of CVA.  CT scan done 2 years ago demonstrated heavy three-vessel coronary disease.  After compensating heart failure, she was to have heart catheterization but it could not be performed because of a combination of things including CKD stage III and behavior.  She is here today for further evaluation and to be set up for left and right heart cath if possible.  Past Medical History:  Diagnosis Date  . Acute combined systolic and diastolic (congestive) hrt fail ([facility])   . Acute mastitis of right breast    10/2003  . Arthritis    bursitis in right shoulder  . Chronic kidney  disease    CKD stage 3 04/28/15  . CKD (chronic kidney disease), stage IV ([place])   . 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 Colon polyps   . 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 Family history of breast cancer   . GERD (gastroesophageal reflux disease)   . Hypercholesterolemia   . Hypertension   . Non-small cell carcinoma of right lung, stage 1 ([facility]) 05/17/2015   Stage IB, right upper lobectomy 04/29/15   . NSCL ca dx'd 03/2015   chemo; RL Lobectomy  . NSTEMI (non-ST elevated myocardial infarction) ([facility]) 03/24/2018  . Pure hypercholesterolemia   . S/P lobectomy of lung 04/29/2015  . Sciatica of right side   . Seasonal allergic rhinitis   . Tobacco dependence   . Tubular adenoma of colon   . Vitamin D deficiency     Past Surgical History:  Procedure Laterality Date  . BREAST SURGERY     small mass removed from left breast--benign  . CRYO INTERCOSTAL NERVE BLOCK Right 04/29/2015   Procedure: CRYO INTERCOSTAL NERVE BLOCK;  Surgeon: [name], MD;  Location: [facility];  Service: Thoracic;  Laterality: Right;  . LOBECTOMY Right 04/29/2015   Procedure: RIGHT LOWER LUNG LOBECTOMY ;  Surgeon: [name], MD;  Location: [facility];  Service: Thoracic;  Laterality: Right;  . LYMPH NODE DISSECTION Right 04/29/2015   Procedure: RIGHT LUNG LYMPH NODE DISSECTION;  Surgeon: [name], MD;  Location: [facility];  Service: Thoracic;  Laterality: Right;  Marland Kitchen VAGINAL DELIVERY  52 yrs ago  . VIDEO ASSISTED THORACOSCOPY (VATS)/ LOBECTOMY Right 04/29/2015   Procedure: RIGHT VIDEO ASSISTED THORACOSCOPY (VATS) WEDGE RESECTION/ RIGHT LOWER LOBECTOMY, CRYO-ANALGESIA OF INTERCOSTAL NERVES;  Surgeon: [name], MD;  Location: [facility];  Service: Thoracic;  Laterality: Right;    Current Medications: Outpatient Medications Prior to Visit  Medication Sig Dispense Refill  . acetaminophen (TYLENOL) 325 MG tablet Take 650 mg by mouth every 6  (six) hours as needed for headache (pain). Reported on 03/27/2016    . aspirin EC 81 MG tablet Take 81 mg by mouth at bedtime.    Marland Kitchen atorvastatin (LIPITOR) 40 MG tablet Take 1 tablet (40 mg total) by mouth daily at 6 PM. 90 tablet 3  . Cholecalciferol (VITAMIN D3 PO) Take 1 capsule by mouth at bedtime.    . isosorbide-hydrALAZINE (BIDIL) 20-37.5 MG tablet Take 1 tablet by mouth 2 (two) times daily. 180 tablet 3  . metoprolol succinate (TOPROL-XL) 100 MG 24 hr tablet Take 1 tablet (100 mg total) by mouth at bedtime. Take with or immediately following a meal. 90 tablet 3  . Multiple Vitamin (MULTIVITAMIN WITH MINERALS) TABS tablet Take 0.5 tablets by mouth at bedtime.    Marland Kitchen QUEtiapine (SEROQUEL) 25 MG tablet Take 1 tablet (25 mg total) by mouth at bedtime. 90 tablet 3   No facility-administered medications prior to visit.      Allergies:   Sulfa antibiotics   Social History   Socioeconomic History  . Marital status: Single    Spouse name: Not on file  . Number of children: Not on file  . Years of education: Not on file  . Highest education level: Not on file  Occupational History  . Not on file  Social Needs  . Financial resource strain: Not hard at all  . Food insecurity:    Worry: Patient refused    Inability: Patient refused  . Transportation needs:    Medical: Patient refused    Non-medical: Patient refused  Tobacco Use  . Smoking status: Former Smoker    Packs/day: 0.75    Years: 47.00    Pack years: 35.25    Types: Cigarettes    Last attempt to quit: 04/29/2015    Years since quitting: 2.9  . Smokeless tobacco: Never Used  . Tobacco comment: no cigarettes since surgery  Substance and Sexual Activity  . Alcohol use: Yes    Alcohol/week: 0.0 oz    Comment: rare for special occasions  . Drug use: No  . Sexual activity: Not Currently    Birth control/protection: None  Lifestyle  . Physical activity:    Days per week: Patient refused    Minutes per session: Patient  refused  . Stress: Only a little  Relationships  . Social connections:    Talks on phone: Patient refused    Gets together: Patient refused    Attends religious service: Patient refused    Active member of club or organization: Patient refused    Attends meetings of clubs or organizations: Patient refused    Relationship status: Patient refused  Other Topics Concern  . Not on file  Social History Narrative  . Not on file     Family History:  The patient's family history includes Cancer in her father and sister; Heart attack in her brother; Other in her son.   ROS:   Please see the history of present illness.    Some orthopnea, dyspnea on exertion, excessive fatigue, anxiety, intermittent confusion, still  living but with her son. All other systems reviewed and are negative.   PHYSICAL EXAM:   VS:  BP 120/68   Pulse 96   Ht 5\' 4"  (1.626 m)   Wt 135 lb 9.6 oz (61.5 kg)   BMI 23.28 kg/m    GEN: Well nourished, well developed, in no acute distress  HEENT: normal  Neck: no JVD, carotid bruits, or masses Cardiac: RRR; no murmurs, rubs, no edema. Respiratory:  clear to auscultation bilaterally, normal work of breathing GI: soft, nontender, nondistended, + BS MS: no deformity or atrophy  Skin: warm and dry, no rash Neuro:  Alert and Oriented x 3, Strength and sensation are intact Psych: euthymic mood, full affect  Wt Readings from Last 3 Encounters:  04/09/18 135 lb 9.6 oz (61.5 kg)  04/01/18 135 lb 9.6 oz (61.5 kg)  02/04/18 144 lb 1.6 oz (65.4 kg)      Studies/Labs Reviewed:   EKG:  EKG reviewed the EKG from 521/19 which revealed left bundle branch block with QRS duration 145 ms.  Recent Labs: 02/04/2018: ALT 15 03/29/2018: B Natriuretic Peptide >4,500.0; Hemoglobin 10.9; Platelets 300 04/02/2018: BUN 34; Creatinine, Ser 2.84; Potassium 4.1; Sodium 145   Lipid Panel    Component Value Date/Time   CHOL 175 03/25/2018 0216   TRIG 103 03/25/2018 0216   HDL 39 (L)  03/25/2018 0216   CHOLHDL 4.5 03/25/2018 0216   VLDL 21 03/25/2018 0216   LDLCALC 115 (H) 03/25/2018 0216    Additional studies/ records that were reviewed today include:  2D Doppler echocardiogram 03/03/2018: Study Conclusions   - Left ventricle: LVEF is severely depressed at approximately 15%   with diffuse hypokinesis; inferior akiensis. The cavity size was   normal. Wall thickness was increased in a pattern of mild LVH. - Mitral valve: There was mild regurgitation. - Pericardium, extracardiac: A trivial pericardial effusion was   identified.  Chest CT with contrast 06/13/2017:    IMPRESSION: 1. No findings suspicious for recurrent or metastatic disease in the chest . 2. Scattered small solid and subsolid pulmonary nodules are all stable. 3. Additional findings include three-vessel coronary atherosclerosis, cholelithiasis and moderate hiatal hernia.   Aortic Atherosclerosis (ICD10-I70.0) and Emphysema (ICD10-J43.9).     ASSESSMENT:    1. NSTEMI (non-ST elevated myocardial infarction) ([facility])   2. Acute combined systolic and diastolic (congestive) hrt fail ([place])   3. CKD (chronic kidney disease), stage IV ([place])   4. Pure hypercholesterolemia   5. Cognitive and neurobehavioral dysfunction   6. Left bundle branch block      PLAN:  In order of problems listed above:  1. STEMI during hospitalization for heart failure, systolic was identified.  Heavy coronary calcification noted in all 3 vessels on CT scan and disorientation and significant chronic kidney disease.  Better coronary angiography during the hospitalization. 2. Recommended left and right heart catheterization with coronary and to define hemodynamics and coronary patency.  Procedure and risks were discussed in detail as noted below.  Accepted by the patient in the presence of her son.  Depending upon findings, in addition to medical therapy CRT may help improve her overall situation. 3. Left and right heart cath  will be performed with his little contrast as possible hopefully from or.  No V gram but hemodynamics will be recorded. 4. LDL target should be less than 70.  Agree with high intensity statin therapy. 5. Needs further evaluation. 6. May need resynchronization therapy.  The patient was counseled to undergo  left and heart catheterization, coronary angiography, and possible percutaneous coronary intervention with stent implantation. The procedural risks and benefits were discussed in detail. The risks discussed included death, stroke, myocardial infarction, life-threatening bleeding, limb ischemia, kidney injury, allergy, and possible emergency cardiac surgery. The risk of these significant complications were estimated to occur less than 1% of the time. After discussion, the patient has agreed to proceed.  Extended office visit with greater than 50% of this time spent in coordination of care, education, and arranging upcoming left and right heart catheterization  Medication Adjustments/Labs and Tests Ordered: Current medicines are reviewed at length with the patient today.  Concerns regarding medicines are outlined above.  Medication changes, Labs and Tests ordered today are listed in the Patient Instructions below. Patient Instructions  Medication Instructions:  Your physician recommends that you continue on your current medications as directed. Please refer to the Current Medication list given to you today.  Labwork: Your physician recommends that you return for lab work no more than 7 days prior to heart catheterization (BMET, CBC).  Cath is scheduled for 6/25.    Testing/Procedures: Your physician has requested that you have a cardiac catheterization. Cardiac catheterization is used to diagnose and/or treat various heart conditions. Doctors may recommend this procedure for a number of different reasons. The most common reason is to evaluate chest pain. Chest pain can be a symptom of coronary  artery disease (CAD), and cardiac catheterization can show whether plaque is narrowing or blocking your heart's arteries. This procedure is also used to evaluate the valves, as well as measure the blood flow and oxygen levels in different parts of your heart. For further information please visit HugeFiesta.[email or web address]. Please follow instruction sheet, as given.   Follow-Up: Your physician recommends that you schedule a follow-up appointment 2-3 weeks after heart cath with PA or NP.    Any Other Special Instructions Will Be Listed Below (If Applicable).    [facility] OFFICE [address] [place] [address] Dept: [phone] Loc: [place]  04/09/2018  You are scheduled for a Cardiac Catheterization on Tuesday, June 25 with Dr. Daneen Schick.  1. Please arrive at the [facility] (Main Entrance A) at [facility]: [address] [place], [place] [address] at 6:30 AM (two hours before your procedure to ensure your preparation). Free valet parking service is available.   Special note: Every effort is made to have your procedure done on time. Please understand that emergencies sometimes delay scheduled procedures.  2. Diet: Do not eat or drink anything after midnight prior to your procedure except sips of water to take medications.  3. Labs: You will need to have blood drawn no more than 7 days prior to your catheterization.  You will be given an appointment for this.   4. Medication instructions in preparation for your procedure:    On the morning of your procedure, take your Aspirin and any morning medicines NOT listed above.  You may use sips of water.  5. Plan for one night stay--bring personal belongings. 6. Bring a current list of your medications and current insurance cards. 7. You MUST have a responsible person to drive you home. 8. Someone MUST be with you the first  24 hours after you arrive home or your discharge will be delayed. 9. Please wear clothes that are easy to get on and off and wear slip-on shoes.  Thank you  for allowing [place] to care for you!   -- [facility]\   If you need a refill on your cardiac medications before your next appointment, please call your pharmacy.      Signed, Sinclair Grooms, MD  04/09/2018 1:20 PM    [facility] [place], [place], [place]  [address] Phone: [phone]; Fax: [phone]

## 2018-04-09 ENCOUNTER — Encounter: Payer: Self-pay | Admitting: Interventional Cardiology

## 2018-04-09 ENCOUNTER — Other Ambulatory Visit: Payer: Self-pay | Admitting: Interventional Cardiology

## 2018-04-09 ENCOUNTER — Ambulatory Visit (INDEPENDENT_AMBULATORY_CARE_PROVIDER_SITE_OTHER): Payer: Medicare Other | Admitting: Interventional Cardiology

## 2018-04-09 VITALS — BP 120/68 | HR 96 | Ht 64.0 in | Wt 135.6 lb

## 2018-04-09 DIAGNOSIS — I214 Non-ST elevation (NSTEMI) myocardial infarction: Secondary | ICD-10-CM

## 2018-04-09 DIAGNOSIS — I2510 Atherosclerotic heart disease of native coronary artery without angina pectoris: Secondary | ICD-10-CM | POA: Diagnosis not present

## 2018-04-09 DIAGNOSIS — F09 Unspecified mental disorder due to known physiological condition: Secondary | ICD-10-CM

## 2018-04-09 DIAGNOSIS — F0789 Other personality and behavioral disorders due to known physiological condition: Secondary | ICD-10-CM | POA: Diagnosis not present

## 2018-04-09 DIAGNOSIS — E7800 Pure hypercholesterolemia, unspecified: Secondary | ICD-10-CM

## 2018-04-09 DIAGNOSIS — N184 Chronic kidney disease, stage 4 (severe): Secondary | ICD-10-CM

## 2018-04-09 DIAGNOSIS — I447 Left bundle-branch block, unspecified: Secondary | ICD-10-CM | POA: Diagnosis not present

## 2018-04-09 DIAGNOSIS — I5041 Acute combined systolic (congestive) and diastolic (congestive) heart failure: Secondary | ICD-10-CM

## 2018-04-09 NOTE — Patient Instructions (Signed)
Medication Instructions:  Your physician recommends that you continue on your current medications as directed. Please refer to the Current Medication list given to you today.  Labwork: Your physician recommends that you return for lab work no more than 7 days prior to heart catheterization (BMET, CBC).  Cath is scheduled for 6/25.    Testing/Procedures: Your physician has requested that you have a cardiac catheterization. Cardiac catheterization is used to diagnose and/or treat various heart conditions. Doctors may recommend this procedure for a number of different reasons. The most common reason is to evaluate chest pain. Chest pain can be a symptom of coronary artery disease (CAD), and cardiac catheterization can show whether plaque is narrowing or blocking your heart's arteries. This procedure is also used to evaluate the valves, as well as measure the blood flow and oxygen levels in different parts of your heart. For further information please visit HugeFiesta.[email or web address]. Please follow instruction sheet, as given.   Follow-Up: Your physician recommends that you schedule a follow-up appointment 2-3 weeks after heart cath with PA or NP.    Any Other Special Instructions Will Be Listed Below (If Applicable).    [facility] OFFICE [address] [place] [address] [place] [address] Dept: [phone] Loc: [place]  04/09/2018  You are scheduled for a Cardiac Catheterization on Tuesday, June 25 with Dr. Daneen Schick.  1. Please arrive at the [facility] (Main Entrance A) at [facility]: [address] [place], [place] [address] at 6:30 AM (two hours before your procedure to ensure your preparation). Free valet parking service is available.   Special note: Every effort is made to have your procedure done on time. Please understand that emergencies sometimes delay scheduled procedures.  2.  Diet: Do not eat or drink anything after midnight prior to your procedure except sips of water to take medications.  3. Labs: You will need to have blood drawn no more than 7 days prior to your catheterization.  You will be given an appointment for this.   4. Medication instructions in preparation for your procedure:    On the morning of your procedure, take your Aspirin and any morning medicines NOT listed above.  You may use sips of water.  5. Plan for one night stay--bring personal belongings. 6. Bring a current list of your medications and current insurance cards. 7. You MUST have a responsible person to drive you home. 8. Someone MUST be with you the first 24 hours after you arrive home or your discharge will be delayed. 9. Please wear clothes that are easy to get on and off and wear slip-on shoes.  Thank you for allowing [place] to care for you!   -- [facility]\   If you need a refill on your cardiac medications before your next appointment, please call your pharmacy.

## 2018-04-11 DIAGNOSIS — I214 Non-ST elevation (NSTEMI) myocardial infarction: Secondary | ICD-10-CM | POA: Diagnosis not present

## 2018-04-11 DIAGNOSIS — I2510 Atherosclerotic heart disease of native coronary artery without angina pectoris: Secondary | ICD-10-CM | POA: Diagnosis not present

## 2018-04-11 DIAGNOSIS — N183 Chronic kidney disease, stage 3 (moderate): Secondary | ICD-10-CM | POA: Diagnosis not present

## 2018-04-11 DIAGNOSIS — J439 Emphysema, unspecified: Secondary | ICD-10-CM | POA: Diagnosis not present

## 2018-04-11 DIAGNOSIS — I5043 Acute on chronic combined systolic (congestive) and diastolic (congestive) heart failure: Secondary | ICD-10-CM | POA: Diagnosis not present

## 2018-04-11 DIAGNOSIS — I130 Hypertensive heart and chronic kidney disease with heart failure and stage 1 through stage 4 chronic kidney disease, or unspecified chronic kidney disease: Secondary | ICD-10-CM | POA: Diagnosis not present

## 2018-04-15 DIAGNOSIS — I130 Hypertensive heart and chronic kidney disease with heart failure and stage 1 through stage 4 chronic kidney disease, or unspecified chronic kidney disease: Secondary | ICD-10-CM | POA: Diagnosis not present

## 2018-04-15 DIAGNOSIS — N183 Chronic kidney disease, stage 3 (moderate): Secondary | ICD-10-CM | POA: Diagnosis not present

## 2018-04-15 DIAGNOSIS — I2510 Atherosclerotic heart disease of native coronary artery without angina pectoris: Secondary | ICD-10-CM | POA: Diagnosis not present

## 2018-04-15 DIAGNOSIS — I5043 Acute on chronic combined systolic (congestive) and diastolic (congestive) heart failure: Secondary | ICD-10-CM | POA: Diagnosis not present

## 2018-04-15 DIAGNOSIS — I214 Non-ST elevation (NSTEMI) myocardial infarction: Secondary | ICD-10-CM | POA: Diagnosis not present

## 2018-04-15 DIAGNOSIS — J439 Emphysema, unspecified: Secondary | ICD-10-CM | POA: Diagnosis not present

## 2018-04-17 DIAGNOSIS — N183 Chronic kidney disease, stage 3 (moderate): Secondary | ICD-10-CM | POA: Diagnosis not present

## 2018-04-17 DIAGNOSIS — I130 Hypertensive heart and chronic kidney disease with heart failure and stage 1 through stage 4 chronic kidney disease, or unspecified chronic kidney disease: Secondary | ICD-10-CM | POA: Diagnosis not present

## 2018-04-17 DIAGNOSIS — J439 Emphysema, unspecified: Secondary | ICD-10-CM | POA: Diagnosis not present

## 2018-04-17 DIAGNOSIS — I2510 Atherosclerotic heart disease of native coronary artery without angina pectoris: Secondary | ICD-10-CM | POA: Diagnosis not present

## 2018-04-17 DIAGNOSIS — I214 Non-ST elevation (NSTEMI) myocardial infarction: Secondary | ICD-10-CM | POA: Diagnosis not present

## 2018-04-17 DIAGNOSIS — I5043 Acute on chronic combined systolic (congestive) and diastolic (congestive) heart failure: Secondary | ICD-10-CM | POA: Diagnosis not present

## 2018-04-22 ENCOUNTER — Inpatient Hospital Stay (HOSPITAL_COMMUNITY)
Admission: EM | Admit: 2018-04-22 | Discharge: 2018-04-26 | DRG: 286 | Disposition: A | Discharge status: Home Health | Payer: Medicare Other | Attending: Cardiovascular Disease | Admitting: Cardiovascular Disease

## 2018-04-22 ENCOUNTER — Emergency Department (HOSPITAL_COMMUNITY): Payer: Medicare Other

## 2018-04-22 ENCOUNTER — Encounter (HOSPITAL_COMMUNITY): Payer: Self-pay | Admitting: Emergency Medicine

## 2018-04-22 ENCOUNTER — Other Ambulatory Visit: Payer: Self-pay

## 2018-04-22 DIAGNOSIS — J439 Emphysema, unspecified: Secondary | ICD-10-CM | POA: Diagnosis present

## 2018-04-22 DIAGNOSIS — I420 Dilated cardiomyopathy: Secondary | ICD-10-CM | POA: Diagnosis present

## 2018-04-22 DIAGNOSIS — F0390 Unspecified dementia without behavioral disturbance: Secondary | ICD-10-CM | POA: Diagnosis present

## 2018-04-22 DIAGNOSIS — I255 Ischemic cardiomyopathy: Secondary | ICD-10-CM

## 2018-04-22 DIAGNOSIS — Z902 Acquired absence of lung [part of]: Secondary | ICD-10-CM | POA: Diagnosis not present

## 2018-04-22 DIAGNOSIS — I25118 Atherosclerotic heart disease of native coronary artery with other forms of angina pectoris: Secondary | ICD-10-CM | POA: Diagnosis not present

## 2018-04-22 DIAGNOSIS — Z79899 Other long term (current) drug therapy: Secondary | ICD-10-CM

## 2018-04-22 DIAGNOSIS — I5043 Acute on chronic combined systolic (congestive) and diastolic (congestive) heart failure: Secondary | ICD-10-CM | POA: Diagnosis present

## 2018-04-22 DIAGNOSIS — R0902 Hypoxemia: Secondary | ICD-10-CM | POA: Diagnosis present

## 2018-04-22 DIAGNOSIS — Z8249 Family history of ischemic heart disease and other diseases of the circulatory system: Secondary | ICD-10-CM

## 2018-04-22 DIAGNOSIS — I472 Ventricular tachycardia: Secondary | ICD-10-CM | POA: Diagnosis not present

## 2018-04-22 DIAGNOSIS — N183 Chronic kidney disease, stage 3 (moderate): Secondary | ICD-10-CM | POA: Diagnosis present

## 2018-04-22 DIAGNOSIS — I5022 Chronic systolic (congestive) heart failure: Secondary | ICD-10-CM | POA: Diagnosis not present

## 2018-04-22 DIAGNOSIS — I428 Other cardiomyopathies: Secondary | ICD-10-CM | POA: Diagnosis not present

## 2018-04-22 DIAGNOSIS — I25110 Atherosclerotic heart disease of native coronary artery with unstable angina pectoris: Secondary | ICD-10-CM

## 2018-04-22 DIAGNOSIS — N179 Acute kidney failure, unspecified: Secondary | ICD-10-CM | POA: Diagnosis present

## 2018-04-22 DIAGNOSIS — Z9221 Personal history of antineoplastic chemotherapy: Secondary | ICD-10-CM | POA: Diagnosis not present

## 2018-04-22 DIAGNOSIS — I4581 Long QT syndrome: Secondary | ICD-10-CM | POA: Diagnosis not present

## 2018-04-22 DIAGNOSIS — I130 Hypertensive heart and chronic kidney disease with heart failure and stage 1 through stage 4 chronic kidney disease, or unspecified chronic kidney disease: Secondary | ICD-10-CM | POA: Diagnosis not present

## 2018-04-22 DIAGNOSIS — Z7982 Long term (current) use of aspirin: Secondary | ICD-10-CM

## 2018-04-22 DIAGNOSIS — Z86010 Personal history of colonic polyps: Secondary | ICD-10-CM | POA: Diagnosis not present

## 2018-04-22 DIAGNOSIS — Z87891 Personal history of nicotine dependence: Secondary | ICD-10-CM | POA: Diagnosis not present

## 2018-04-22 DIAGNOSIS — I252 Old myocardial infarction: Secondary | ICD-10-CM

## 2018-04-22 DIAGNOSIS — Z803 Family history of malignant neoplasm of breast: Secondary | ICD-10-CM

## 2018-04-22 DIAGNOSIS — Z882 Allergy status to sulfonamides status: Secondary | ICD-10-CM

## 2018-04-22 DIAGNOSIS — R079 Chest pain, unspecified: Secondary | ICD-10-CM

## 2018-04-22 DIAGNOSIS — I447 Left bundle-branch block, unspecified: Secondary | ICD-10-CM | POA: Diagnosis present

## 2018-04-22 DIAGNOSIS — Z85118 Personal history of other malignant neoplasm of bronchus and lung: Secondary | ICD-10-CM

## 2018-04-22 DIAGNOSIS — R072 Precordial pain: Secondary | ICD-10-CM | POA: Diagnosis not present

## 2018-04-22 DIAGNOSIS — E785 Hyperlipidemia, unspecified: Secondary | ICD-10-CM | POA: Diagnosis present

## 2018-04-22 DIAGNOSIS — R0602 Shortness of breath: Secondary | ICD-10-CM | POA: Diagnosis not present

## 2018-04-22 LAB — CBC
HCT: 37.3 % (ref 36.0–46.0)
Hemoglobin: 12.2 g/dL (ref 12.0–15.0)
MCH: 30.1 pg (ref 26.0–34.0)
MCHC: 32.7 g/dL (ref 30.0–36.0)
MCV: 92.1 fL (ref 78.0–100.0)
PLATELETS: 288 10*3/uL (ref 150–400)
RBC: 4.05 MIL/uL (ref 3.87–5.11)
RDW: 13.9 % (ref 11.5–15.5)
WBC: 7.9 10*3/uL (ref 4.0–10.5)

## 2018-04-22 LAB — DIFFERENTIAL
ABS IMMATURE GRANULOCYTES: 0 10*3/uL (ref 0.0–0.1)
Basophils Absolute: 0.1 10*3/uL (ref 0.0–0.1)
Basophils Relative: 1 %
Eosinophils Absolute: 0.2 10*3/uL (ref 0.0–0.7)
Eosinophils Relative: 2 %
Immature Granulocytes: 0 %
LYMPHS ABS: 2.1 10*3/uL (ref 0.7–4.0)
LYMPHS PCT: 27 %
MONO ABS: 0.6 10*3/uL (ref 0.1–1.0)
MONOS PCT: 7 %
NEUTROS ABS: 5 10*3/uL (ref 1.7–7.7)
NEUTROS PCT: 63 %

## 2018-04-22 LAB — BASIC METABOLIC PANEL
Anion gap: 11 (ref 5–15)
BUN: 30 mg/dL — AB (ref 6–20)
CHLORIDE: 112 mmol/L — AB (ref 101–111)
CO2: 20 mmol/L — AB (ref 22–32)
CREATININE: 2.69 mg/dL — AB (ref 0.44–1.00)
Calcium: 9.7 mg/dL (ref 8.9–10.3)
GFR calc Af Amer: 19 mL/min — ABNORMAL LOW (ref >60)
GFR calc non Af Amer: 16 mL/min — ABNORMAL LOW (ref >60)
Glucose, Bld: 110 mg/dL — ABNORMAL HIGH (ref 65–99)
Potassium: 4.4 mmol/L (ref 3.5–5.1)
Sodium: 143 mmol/L (ref 135–145)

## 2018-04-22 LAB — I-STAT TROPONIN, ED: Troponin i, poc: 0.08 ng/mL (ref 0.00–0.08)

## 2018-04-22 LAB — BRAIN NATRIURETIC PEPTIDE: B Natriuretic Peptide: 3923.9 pg/mL — ABNORMAL HIGH (ref 0.0–100.0)

## 2018-04-22 LAB — TROPONIN I
Troponin I: 0.05 ng/mL (ref <0.03)
Troponin I: 0.05 ng/mL (ref <0.03)

## 2018-04-22 MED ORDER — ONDANSETRON HCL 4 MG/2ML IJ SOLN
4.0000 mg | Freq: Once | INTRAMUSCULAR | Status: AC
  Administered 2018-04-22: 4 mg via INTRAVENOUS
  Filled 2018-04-22: qty 2

## 2018-04-22 MED ORDER — METOPROLOL SUCCINATE ER 50 MG PO TB24: 50.0000 mg | ORAL_TABLET | Freq: Every day | ORAL | Status: DC

## 2018-04-22 MED ORDER — NITROGLYCERIN 0.4 MG SL SUBL: 0.4000 mg | SUBLINGUAL_TABLET | SUBLINGUAL | Status: DC | PRN

## 2018-04-22 MED ORDER — ASPIRIN EC 81 MG PO TBEC
81.0000 mg | DELAYED_RELEASE_TABLET | Freq: Every day | ORAL | Status: DC
  Administered 2018-04-22 – 2018-04-25 (×4): 81 mg via ORAL
  Filled 2018-04-22 (×4): qty 1

## 2018-04-22 MED ORDER — ONDANSETRON HCL 4 MG/2ML IJ SOLN: 4.0000 mg | Freq: Four times a day (QID) | INTRAMUSCULAR | Status: DC | PRN

## 2018-04-22 MED ORDER — ASPIRIN 81 MG PO CHEW
324.0000 mg | CHEWABLE_TABLET | Freq: Once | ORAL | Status: AC
  Administered 2018-04-22: 324 mg via ORAL
  Filled 2018-04-22: qty 4

## 2018-04-22 MED ORDER — MORPHINE SULFATE (PF) 4 MG/ML IV SOLN
4.0000 mg | Freq: Once | INTRAVENOUS | Status: AC
  Administered 2018-04-22: 4 mg via INTRAVENOUS
  Filled 2018-04-22: qty 1

## 2018-04-22 MED ORDER — HEPARIN SODIUM (PORCINE) 5000 UNIT/ML IJ SOLN
5000.0000 [IU] | Freq: Three times a day (TID) | INTRAMUSCULAR | Status: DC
  Administered 2018-04-22 – 2018-04-25 (×6): 5000 [IU] via SUBCUTANEOUS
  Filled 2018-04-22 (×6): qty 1

## 2018-04-22 MED ORDER — SODIUM CHLORIDE 0.9 % IV SOLN
INTRAVENOUS | Status: AC
  Administered 2018-04-22: 14:00:00 via INTRAVENOUS

## 2018-04-22 MED ORDER — ADULT MULTIVITAMIN W/MINERALS CH
0.5000 | ORAL_TABLET | Freq: Every day | ORAL | Status: DC
  Administered 2018-04-22 – 2018-04-25 (×3): 0.5 via ORAL
  Filled 2018-04-22 (×3): qty 1

## 2018-04-22 MED ORDER — ACETAMINOPHEN 325 MG PO TABS: 650.0000 mg | ORAL_TABLET | ORAL | Status: DC | PRN

## 2018-04-22 MED ORDER — ATORVASTATIN CALCIUM 40 MG PO TABS
40.0000 mg | ORAL_TABLET | Freq: Every day | ORAL | Status: DC
  Administered 2018-04-23: 40 mg via ORAL
  Filled 2018-04-22: qty 1

## 2018-04-22 MED ORDER — QUETIAPINE FUMARATE 25 MG PO TABS
25.0000 mg | ORAL_TABLET | Freq: Every day | ORAL | Status: DC
  Administered 2018-04-22 – 2018-04-25 (×4): 25 mg via ORAL
  Filled 2018-04-22 (×5): qty 1

## 2018-04-22 NOTE — ED Notes (Signed)
Pt O2 sats dropped to mid 80's while sleeping. Pt placed on 3L [place].

## 2018-04-22 NOTE — ED Triage Notes (Signed)
Pt reports central CP with radiation to L chest onset early this morning. Pt also reports weakness and SOB, states the pain takes her breath away. Pt reports "not feeling well" since yesterday. Pt seen a month ago for similar S/S.

## 2018-04-22 NOTE — H&P (Addendum)
History & Physical    Patient ID: Betty [name] MRN: [id number], DOB/AGE: 06/28/1946   Admit date: 04/22/2018 Primary Physician: Josetta Huddle, MD Primary Cardiologist: Dr. Daneen Schick III, MD  Patient Profile    Betty [name] is a 72yo F with a hx of CKD stage III, essential HTN, stage Ib lung cancer (s/p chemotherapy and surgical intervention), HLD, COPD, dementia and recent dx of severe chronic systolic heart failure with EF of 15% per echocardiogram by PCP. She had a recent hospitalization for acute CHF and NSTEMI with aborted cath (04/01/18) secondary to acute confusion and agitation however with presumed CAD.     Past Medical History   Past Medical History:  Diagnosis Date  . Acute combined systolic and diastolic (congestive) hrt fail ([place])   . Acute mastitis of right breast    10/2003  . Arthritis    bursitis in right shoulder  . Chronic kidney disease    CKD stage 3 04/28/15  . CKD (chronic kidney disease), stage IV ([place])   . 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 Colon polyps   . 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 Family history of breast cancer   . GERD (gastroesophageal reflux disease)   . Hypercholesterolemia   . Hypertension   . Non-small cell carcinoma of right lung, stage 1 ([place]) 05/17/2015   Stage IB, right upper lobectomy 04/29/15   . NSCL ca dx'd 03/2015   chemo; RL Lobectomy  . NSTEMI (non-ST elevated myocardial infarction) ([place]) 03/24/2018  . Pure hypercholesterolemia   . S/P lobectomy of lung 04/29/2015  . Sciatica of right side   . Seasonal allergic rhinitis   . Tobacco dependence   . Tubular adenoma of colon   . Vitamin D deficiency     Past Surgical History:  Procedure Laterality Date  . BREAST SURGERY     small mass removed from left breast--benign  . CRYO INTERCOSTAL NERVE BLOCK Right 04/29/2015   Procedure: CRYO INTERCOSTAL NERVE BLOCK;  Surgeon: [name], MD;  Location: [place];  Service:  Thoracic;  Laterality: Right;  . LOBECTOMY Right 04/29/2015   Procedure: RIGHT LOWER LUNG LOBECTOMY ;  Surgeon: [name], MD;  Location: [place];  Service: Thoracic;  Laterality: Right;  . LYMPH NODE DISSECTION Right 04/29/2015   Procedure: RIGHT LUNG LYMPH NODE DISSECTION;  Surgeon: [name], MD;  Location: [place];  Service: Thoracic;  Laterality: Right;  Marland Kitchen VAGINAL DELIVERY     52 yrs ago  . VIDEO ASSISTED THORACOSCOPY (VATS)/ LOBECTOMY Right 04/29/2015   Procedure: RIGHT VIDEO ASSISTED THORACOSCOPY (VATS) WEDGE RESECTION/ RIGHT LOWER LOBECTOMY, CRYO-ANALGESIA OF INTERCOSTAL NERVES;  Surgeon: [name], MD;  Location: [place];  Service: Thoracic;  Laterality: Right;    Allergies  Allergies  Allergen Reactions  . Sulfa Antibiotics Itching   History of Present Illness    Betty [name] presented to [facility] on 04/22/18 after son found her sitting on the side of the bed early this AM with complaints of central and left sided chest pain. Due to her dementia, she is a poor historian and cannot reliably tell her story. Son is at bedside to help with history of events. He states that he was drawn to her bedroom after hearing her cry out in pain around 3AM this morning. She is unable to rate the pain, as she does not remember that she had it. He reports that she had associated weakness and  nausea, but no vomiting. He denies that she was diaphoretic, dizzy or experienced syncope. She has known hx of recent dx of systolic CHF (VE93%) and sustained a NSTEMI earlier this month with plans for further ischemic cardiac evaluation with R/LHC on 04/29/18. Given her hx and recent symptoms patient's son brought her to the ED for further evaluation.   In the ED, an iStat trop was dawn which is elevated at 0.08. CXR revealed no evidence of acute pulmonary disease. She has been mildly hypoxic while in the ED, requiring 3L [place] to keep O2 saturations greater than 90%. She does not currently wear  supplemental home O2. She was given ASA 324mg , morphine and Zofran with pain resolution.   Of note, the patient was last seen in the office by Dr. Tamala Julian on 04/09/18 for hospital follow up. Prior to this, she presented to her PCP office with increased SOB over one month in which an echocardiogram was performed (03/03/18) while awaiting an appointment with Cardiology. The echo revealed an EF of 15% with diffuse hypokinesis, inferior hyopkinesis. Unfortunately she became acutely ill and required hospitalization for acute on chronic systolic heart failure and NSTEMI (discharged 04/02/18). An NST was therefore performed on 03/25/18 in which there were poor quality stress images with evidence suggestive of septal, apical, distal, anterior and inferior wall ischemia. A LHC was attempted on 03/28/18 given the results of the NST, however was aborted given her confusion, aggression and CKD III. Therefore, appropriate medical therapy was started. After discharge, she was then seen by Dr. Tamala Julian with recommendations to proceed with [facility] scheduled for 04/29/18. Depending upon findings, she may require CRT to help improve her overall situation per chart review.   Cardiology was asked to admit given her hx and the need to perform a cardiac cath while here in the hospital.   Home Medications    Prior to Admission medications   Medication Sig Start Date End Date Taking? Authorizing Provider  acetaminophen (TYLENOL) 325 MG tablet Take 650 mg by mouth every 6 (six) hours as needed for headache (pain). Reported on 03/27/2016   Yes [provider]  aspirin EC 81 MG tablet Take 81 mg by mouth at bedtime.   Yes [provider]  atorvastatin (LIPITOR) 40 MG tablet Take 1 tablet (40 mg total) by mouth daily at 6 PM. 04/02/18  Yes Kroeger, Lorelee Cover., PA-C  Cholecalciferol (VITAMIN D3 PO) Take 1 capsule by mouth at bedtime.   Yes [provider]  isosorbide-hydrALAZINE (BIDIL) 20-37.5 MG tablet Take 1  tablet by mouth 2 (two) times daily. 04/02/18  Yes Kroeger, Daleen Snook M., PA-C  metoprolol succinate (TOPROL-XL) 100 MG 24 hr tablet Take 1 tablet (100 mg total) by mouth at bedtime. Take with or immediately following a meal. 04/02/18  Yes Kroeger, Lorelee Cover., PA-C  Multiple Vitamin (MULTIVITAMIN WITH MINERALS) TABS tablet Take 0.5 tablets by mouth at bedtime.   Yes [provider]  QUEtiapine (SEROQUEL) 25 MG tablet Take 1 tablet (25 mg total) by mouth at bedtime. 04/02/18  Yes Kroeger, Lorelee Cover., PA-C   Family History    Family History  Problem Relation Age of Onset  . Cancer Father   . Cancer Sister   . Heart attack Brother   . Other Son        bicuspid aortic valve   Social History    Social History   Socioeconomic History  . Marital status: Single    Spouse name: Not on file  . Number  of children: Not on file  . Years of education: Not on file  . Highest education level: Not on file  Occupational History  . Not on file  Social Needs  . Financial resource strain: Not hard at all  . Food insecurity:    Worry: Patient refused    Inability: Patient refused  . Transportation needs:    Medical: Patient refused    Non-medical: Patient refused  Tobacco Use  . Smoking status: Former Smoker    Packs/day: 0.75    Years: 47.00    Pack years: 35.25    Types: Cigarettes    Last attempt to quit: 04/29/2015    Years since quitting: 2.9  . Smokeless tobacco: Never Used  . Tobacco comment: no cigarettes since surgery  Substance and Sexual Activity  . Alcohol use: Yes    Alcohol/week: 0.0 oz    Comment: rare for special occasions  . Drug use: No  . Sexual activity: Not Currently    Birth control/protection: None  Lifestyle  . Physical activity:    Days per week: Patient refused    Minutes per session: Patient refused  . Stress: Only a little  Relationships  . Social connections:    Talks on phone: Patient refused    Gets together: Patient refused    Attends religious  service: Patient refused    Active member of club or organization: Patient refused    Attends meetings of clubs or organizations: Patient refused    Relationship status: Patient refused  . Intimate partner violence:    Fear of current or ex partner: Patient refused    Emotionally abused: Patient refused    Physically abused: Patient refused    Forced sexual activity: Patient refused  Other Topics Concern  . Not on file  Social History Narrative  . Not on file     Review of Systems   Please see HPI All other systems reviewed and are otherwise negative except as noted above.  Physical Exam    Blood pressure 109/68, pulse 77, temperature 97.7 F (36.5 C), temperature source Oral, resp. rate 20, height 5\' 4"  (1.626 m), weight 135 lb (61.2 kg), SpO2 100 %.   General: Elderly, frail, NAD Skin: Warm, dry, intact  Head: Normocephalic, atraumatic, sclera non-icteric, no xanthomas, clear, moist mucus membranes. Neck: Negative for carotid bruits. No JVD Lungs: Diminished L/R bases. No wheezes, rales, or rhonchi. Breathing is unlabored. 3L [place]  Cardiovascular: RRR with S1 S2. No murmurs, rubs or gallops Abdomen: Soft, non-tender, non-distended with normoactive bowel sounds. No obvious abdominal masses. MSK: Strength and tone appear normal for age. 5/5 in all extremities Extremities: No edema. No clubbing or cyanosis. DP/PT pulses 2+ bilaterally Neuro: Alert and oriented to self only. No focal deficits. No facial asymmetry. MAE spontaneously. Psych: Responds to some questions appropriately with normal affect.    Labs    Troponin [facility] Test) Recent Labs    04/22/18 0653  TROPIPOC 0.08   No results for input(s): CKTOTAL, CKMB, TROPONINI in the last 72 hours. Lab Results  Component Value Date   WBC 7.9 04/22/2018   HGB 12.2 04/22/2018   HCT 37.3 04/22/2018   MCV 92.1 04/22/2018   PLT 288 04/22/2018    Recent Labs  Lab 04/22/18 0647  NA 143  K 4.4  CL 112*  CO2 20*    BUN 30*  CREATININE 2.69*  CALCIUM 9.7  GLUCOSE 110*   Lab Results  Component Value Date  CHOL 175 03/25/2018   HDL 39 (L) 03/25/2018   LDLCALC 115 (H) 03/25/2018   TRIG 103 03/25/2018   No results found for: [facility]   Radiology Studies    Dg Chest 2 View  Result Date: 03/24/2018 CLINICAL DATA:  Chest pain. EXAM: CHEST - 2 VIEW COMPARISON:  03/05/2018 FINDINGS: There is a small right pleural effusion. There is bilateral mild interstitial thickening. There is no left pleural effusion. There is no pneumothorax. There is no focal consolidation. The heart and mediastinal contours are unremarkable. The osseous structures are unremarkable. IMPRESSION: Small right pleural effusion which is increased in size compared with the prior exam. Electronically Signed   By: Kathreen Devoid   On: 03/24/2018 16:40   Nm Myocar Multi W/spect W/wall Motion / Ef  Result Date: 03/25/2018  There was no ST segment deviation noted during stress.  Findings consistent with ischemia.  This is a high risk study.  The left ventricular ejection fraction is severely decreased (<30%).  Severe LVE with diffuse hypokinesis inferior basal dyskinesis EF 17% Poor quality study with stress images having diffusely less counts than rest Suggestion of septal apical distal anterior wall and inferior wall ischemia   Dg Chest Portable 1 View  Result Date: 04/22/2018 CLINICAL DATA:  Chest pain, shortness of breath, CHF. EXAM: PORTABLE CHEST 1 VIEW COMPARISON:  03/24/2018 FINDINGS: Cardiac enlargement. No vascular congestion. Blunting of the right costophrenic angle is unchanged since previous study. This may represent chronic pleural effusion or pleural thickening. Atelectasis in the right lung base. Left lung is clear and expanded. No pneumothorax. Mediastinal contours appear intact. Surgical clips in the right hilum. IMPRESSION: Cardiac enlargement. No evidence of active pulmonary disease. Blunting of the right costophrenic angle  unchanged since prior study, suggesting chronic pleural effusion or pleural thickening. Electronically Signed   By: Lucienne Capers M.D.   On: 04/22/2018 06:45   ECG & Cardiac Imaging    Echocardiogram 03/03/18: Study Conclusions  - Left ventricle: LVEF is severely depressed at approximately 15% with diffuse hypokinesis; inferior akiensis. The cavity size was normal. Wall thickness was increased in a pattern of mild LVH. - Mitral valve: There was mild regurgitation. - Pericardium, extracardiac: A trivial pericardial effusion was identified.  Lexiscan Myoview stress test 03/25/18:  There was no ST segment deviation noted during stress.  Findings consistent with ischemia.  This is a high risk study.  The left ventricular ejection fraction is severely decreased (<30%).   Severe LVE with diffuse hypokinesis inferior basal dyskinesis EF 17% Poor quality study with stress images having diffusely less counts than rest Suggestion of septal apical distal anterior wall and inferior wall ischemia  Assessment & Plan    1. Chest pain with recent NSTEMI with presumed CAD: -Pt recently presented to the hospital on 03/24/18 with c/o SOB, a BNP of 4500 and a recently dx severe decreased LV function of 15% per echocardiogram in 03/03/18 with diffuse wall motion abnormalities. Peak Trop was 0.19. EKG without ischemic changes. She underwent an NST on 03/25/18 which was abnormal with severe LVE and diffuse hypokinesis in all walls. LHC was attempted, however was aborted due to acute confusion and agitation with plans for medical management. She was seen by Dr. Tamala Julian in follow up on 04/09/18 and due to resolution of acute confusion and the need to further evaluate her coronary anatomy, she was scheduled for a R/LHC on 04/29/18.   -Pt presented to [facility] on 04/22/18 with an episode of chest pain earlier this  AM. She was found by her son on the side of the bed, crying out in pain. Due to advanced  dementia, she is unable to fully recall preceding events.  -Trop, 0.08>>continue to trend -EKG with no acute ischemic changes, LBBB (prior) -Continue ASA, metoprolol, atorvastatin -Will plan for [facility] this admission for further ischemic evaluation. Will reassess timing of LHC tomorrow AM.  -Creatinine is currently elevated above what appears to be her baseline>>2.69 today>>BMET in AM    2. Acute on chronic combined CHF: -Recent echocardiogram on 03/03/18 with LVEF of 15% and diffuse hypokinesis with abnormal NST with plans for [facility] on 04/29/18 for further evaluation  -Pt was started on Bidil and Toprol-XL -Does not appear to be fluid volume overloaded at this time -Son reports she experiences orthopnea   3. CKD Stage III: -Creatinine today 2.96. Baseline appears to be in the 2.1-2.3 range. -Pt was discharged on 04/02/18 with a creatinine of 2.84 -If preceding with cath, will need to be cautious of contrast dye. Per chart review, will avoid LV gram   4. Dementia: -Pt alert and oriented to self, however cannot recall events leading up to this admission. She currently lives at home with her son.  -Will need PCP follow up as OP  -Home Seroquel   5. HTN: -Stable, 118/74>109/68>103/70 -Given soft BP will restart Toprol-XL 50mg    6. HLD: -Stable, CHO-175, HDL-39, LDL-115, Trig-103 -Continue atorvastatin 40mg   7. Mild hypoxia: -Maintain O2 saturations greater than 90%>>currently on 3L [place]  -Asymptomatic    Signed, Kathyrn Drown NP-C [facility] Pager: [phone] 04/22/2018,  I have personally seen and examined this patient. I agree with the assessment and plan as outlined above.  Betty [name] is known to have a severe cardiomyopathy with LVEF=15%. She is suspected to have high grade CAD. CT chest 2 years ago with heavy coronary calcification. During most recent hospitalization in may 2019, she was unable to complete the cardiac cath due to agitation and behavior in the cath lab. She has  seen Dr. Tamala Julian in the office 04/09/18 and cath is planned for 04/29/18 as an outpatient. Now admitted with chest pain. First troponin is 0.08. Her EKG shows sinus with chronic LBBB. I have personally reviewed her EKG.  She has dementia and is here with her son. She denies current chest pain or dyspnea. She is on 3 liters supplemental O2 for hypoxia. Chest x-ray without overt pulmonary edema. There is a small right pleural effusion.  My exam: General: Well developed, well nourished, NAD. She is pleasant but confused.  HEENT: OP clear, mucus membranes moist  SKIN: warm, dry. No rashes. Neuro: No focal deficits  Musculoskeletal: Muscle strength 5/5 all ext  Psychiatric: Mood and affect normal  Neck: No JVD, no carotid bruits, no thyromegaly, no lymphadenopathy.  Lungs:Clear in the left lung with slight crackles in the right base.  Cardiovascular: Regular rate and rhythm. No murmurs, gallops or rubs. Abdomen:Soft. Bowel sounds present. Non-tender.  Extremities: No lower extremity edema. Pulses are 2 + in the bilateral DP/PT.  Plan:  1. CAD with unstable angina: She is having chest pain. Given CKD with acute worsening, will have to hydrate prior to cardiac cath. Will cycle troponin. Gentle hydration given LV systolic dysfunction. Will follow creatinine and plan right and left heart cath later this week if renal function allows.  2. Chronic systolic CHF/Presumed ischemic cardiomyopathy: Her volume status is ok today. Continue medical therapy with Bidil and Toprol. No Ace-inh/ARB with CKD.  3. CKD: follow  renal function closely. Gentle hydration.   Lauree Chandler 04/22/2018 1:16 PM

## 2018-04-22 NOTE — ED Provider Notes (Signed)
[facility] EMERGENCY DEPARTMENT Provider Note   CSN: [id number] Arrival date & time: 04/22/18  0606     History   Chief Complaint Chief Complaint  Patient presents with  . Chest Pain    HPI Betty [name] is a 72 y.o. female with history of CHF, COPD, CKD, recent N STEMI on 03/24/2018 who presents with chest pain that began in the night.  It has been waxing and waning since onset.  She describes it as sharp and radiating to her left arm.  She reports associated shortness of breath and nausea, but no vomiting.  She reports her pain is similar to when she was in the hospital last month.  Per chart review, left heart cath was attempted, but was aborted due to patient's agitation.  Cardiology opted for medical management.  Patient has been taking her medications as prescribed.  She denies any history of blood clots.  Her pain is not pleuritic. She denies any new leg pain or swelling, recent long trips, surgeries, current cancer.  Patient does have history of lung cancer and had right upper lobectomy and chemo according to patient's son.  HPI  Past Medical History:  Diagnosis Date  . Acute combined systolic and diastolic (congestive) hrt fail ([place])   . Acute mastitis of right breast    10/2003  . Arthritis    bursitis in right shoulder  . Chronic kidney disease    CKD stage 3 04/28/15  . CKD (chronic kidney disease), stage IV ([place])   . 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 Colon polyps   . 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 Family history of breast cancer   . GERD (gastroesophageal reflux disease)   . Hypercholesterolemia   . Hypertension   . Non-small cell carcinoma of right lung, stage 1 ([place]) 05/17/2015   Stage IB, right upper lobectomy 04/29/15   . NSCL ca dx'd 03/2015   chemo; RL Lobectomy  . NSTEMI (non-ST elevated myocardial infarction) ([place]) 03/24/2018  . Pure hypercholesterolemia   . S/P lobectomy of lung  04/29/2015  . Sciatica of right side   . Seasonal allergic rhinitis   . Tobacco dependence   . Tubular adenoma of colon   . Vitamin D deficiency     Patient Active Problem List   Diagnosis Date Noted  . Chest pain 04/22/2018  . 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 NSTEMI (non-ST elevated myocardial infarction) ([place]) 03/24/2018  . Acute combined systolic and diastolic (congestive) hrt fail ([place])   . Pure hypercholesterolemia   . CKD (chronic kidney disease), stage IV ([place])   . Non-small cell carcinoma of right lung, stage 1 ([place]) 05/17/2015  . S/P lobectomy of lung 04/29/2015    Past Surgical History:  Procedure Laterality Date  . BREAST SURGERY     small mass removed from left breast--benign  . CRYO INTERCOSTAL NERVE BLOCK Right 04/29/2015   Procedure: CRYO INTERCOSTAL NERVE BLOCK;  Surgeon: [name], MD;  Location: [facility];  Service: Thoracic;  Laterality: Right;  . LOBECTOMY Right 04/29/2015   Procedure: RIGHT LOWER LUNG LOBECTOMY ;  Surgeon: [name], MD;  Location: [facility];  Service: Thoracic;  Laterality: Right;  . LYMPH NODE DISSECTION Right 04/29/2015   Procedure: RIGHT LUNG LYMPH NODE DISSECTION;  Surgeon: [name], MD;  Location: [facility];  Service: Thoracic;  Laterality: Right;  Marland Kitchen VAGINAL DELIVERY     51 yrs ago  .  VIDEO ASSISTED THORACOSCOPY (VATS)/ LOBECTOMY Right 04/29/2015   Procedure: RIGHT VIDEO ASSISTED THORACOSCOPY (VATS) WEDGE RESECTION/ RIGHT LOWER LOBECTOMY, CRYO-ANALGESIA OF INTERCOSTAL NERVES;  Surgeon: [name], MD;  Location: [facility];  Service: Thoracic;  Laterality: Right;     OB History   None      Home Medications    Prior to Admission medications   Medication Sig Start Date End Date Taking? Authorizing Provider  acetaminophen (TYLENOL) 325 MG tablet Take 650 mg by mouth every 6 (six) hours as needed for headache (pain). Reported on 03/27/2016   Yes [provider]  aspirin EC 81 MG  tablet Take 81 mg by mouth at bedtime.   Yes [provider]  atorvastatin (LIPITOR) 40 MG tablet Take 1 tablet (40 mg total) by mouth daily at 6 PM. 04/02/18  Yes Kroeger, Lorelee Cover., PA-C  Cholecalciferol (VITAMIN D3 PO) Take 1 capsule by mouth at bedtime.   Yes [provider]  isosorbide-hydrALAZINE (BIDIL) 20-37.5 MG tablet Take 1 tablet by mouth 2 (two) times daily. 04/02/18  Yes Kroeger, Daleen Snook M., PA-C  metoprolol succinate (TOPROL-XL) 100 MG 24 hr tablet Take 1 tablet (100 mg total) by mouth at bedtime. Take with or immediately following a meal. 04/02/18  Yes Kroeger, Lorelee Cover., PA-C  Multiple Vitamin (MULTIVITAMIN WITH MINERALS) TABS tablet Take 0.5 tablets by mouth at bedtime.   Yes [provider]  QUEtiapine (SEROQUEL) 25 MG tablet Take 1 tablet (25 mg total) by mouth at bedtime. 04/02/18  Yes Kroeger, Lorelee Cover., PA-C    Family History Family History  Problem Relation Age of Onset  . Cancer Father   . Cancer Sister   . Heart attack Brother   . Other Son        bicuspid aortic valve    Social History Social History   Tobacco Use  . Smoking status: Former Smoker    Packs/day: 0.75    Years: 47.00    Pack years: 35.25    Types: Cigarettes    Last attempt to quit: 04/29/2015    Years since quitting: 2.9  . Smokeless tobacco: Never Used  . Tobacco comment: no cigarettes since surgery  Substance Use Topics  . Alcohol use: Yes    Alcohol/week: 0.0 oz    Comment: rare for special occasions  . Drug use: No     Allergies   Sulfa antibiotics   Review of Systems Review of Systems  Constitutional: Negative for chills and fever.  HENT: Negative for facial swelling and sore throat.   Respiratory: Positive for shortness of breath.   Cardiovascular: Positive for chest pain.  Gastrointestinal: Positive for nausea. Negative for abdominal pain and vomiting.  Genitourinary: Negative for dysuria.  Musculoskeletal: Negative for back pain.  Skin:  Negative for rash and wound.  Neurological: Negative for headaches.  Psychiatric/Behavioral: The patient is not nervous/anxious.      Physical Exam Updated Vital Signs BP 108/72   Pulse 82   Temp 97.7 F (36.5 C) (Oral)   Resp 19   Ht 5\' 4"  (1.626 m)   Wt 61.2 kg (135 lb)   SpO2 99%   BMI 23.17 kg/m   Physical Exam  Constitutional: She appears well-developed and well-nourished. No distress.  Patient appears uncomfortable  HENT:  Head: Normocephalic and atraumatic.  Mouth/Throat: Oropharynx is clear and moist. No oropharyngeal exudate.  Eyes: Pupils are equal, round, and reactive to light. Conjunctivae are normal. Right eye exhibits no discharge. Left eye exhibits no  discharge. No scleral icterus.  Neck: Normal range of motion. Neck supple. No thyromegaly present.  Cardiovascular: Normal rate, regular rhythm, normal heart sounds and intact distal pulses. Exam reveals no gallop and no friction rub.  No murmur heard. Pulmonary/Chest: Effort normal and breath sounds normal. No stridor. No respiratory distress. She has no wheezes. She has no rales. She exhibits no tenderness.  Abdominal: Soft. Bowel sounds are normal. She exhibits no distension. There is no tenderness. There is no rebound and no guarding.  Musculoskeletal: She exhibits no edema.  Lymphadenopathy:    She has no cervical adenopathy.  Neurological: She is alert. Coordination normal.  Skin: Skin is warm and dry. No rash noted. She is not diaphoretic. No pallor.  Psychiatric: She has a normal mood and affect.  Nursing note and vitals reviewed.    ED Treatments / Results  Labs (all labs ordered are listed, but only abnormal results are displayed) Labs Reviewed  BASIC METABOLIC PANEL - Abnormal; Notable for the following components:      Result Value   Chloride 112 (*)    CO2 20 (*)    Glucose, Bld 110 (*)    BUN 30 (*)    Creatinine, Ser 2.69 (*)    GFR calc non Af Amer 16 (*)    GFR calc Af Amer 19 (*)     All other components within normal limits  CBC  BRAIN NATRIURETIC PEPTIDE  TROPONIN I  TROPONIN I  TROPONIN I  CBC  CBC WITH DIFFERENTIAL/PLATELET  DIFFERENTIAL  I-STAT TROPONIN, ED    EKG EKG Interpretation  Date/Time:  Tuesday April 22 2018 06:17:00 EDT Ventricular Rate:  87 PR Interval:    QRS Duration: 157 QT Interval:  465 QTC Calculation: 560 R Axis:   -43 Text Interpretation:  Sinus rhythm Probable left atrial enlargement Left bundle branch block When compared with ECG of 03/24/2018, No significant change was found Confirmed by Delora Fuel ([id number]) on 04/22/2018 6:51:20 AM   Radiology Dg Chest Portable 1 View  Result Date: 04/22/2018 CLINICAL DATA:  Chest pain, shortness of breath, CHF. EXAM: PORTABLE CHEST 1 VIEW COMPARISON:  03/24/2018 FINDINGS: Cardiac enlargement. No vascular congestion. Blunting of the right costophrenic angle is unchanged since previous study. This may represent chronic pleural effusion or pleural thickening. Atelectasis in the right lung base. Left lung is clear and expanded. No pneumothorax. Mediastinal contours appear intact. Surgical clips in the right hilum. IMPRESSION: Cardiac enlargement. No evidence of active pulmonary disease. Blunting of the right costophrenic angle unchanged since prior study, suggesting chronic pleural effusion or pleural thickening. Electronically Signed   By: Lucienne Capers M.D.   On: 04/22/2018 06:45    Procedures Procedures (including critical care time)  Medications Ordered in ED Medications  aspirin EC tablet 81 mg (has no administration in time range)  atorvastatin (LIPITOR) tablet 40 mg (has no administration in time range)  multivitamin with minerals tablet 0.5 tablet (has no administration in time range)  QUEtiapine (SEROQUEL) tablet 25 mg (has no administration in time range)  nitroGLYCERIN (NITROSTAT) SL tablet 0.4 mg (has no administration in time range)  acetaminophen (TYLENOL) tablet 650 mg (has no  administration in time range)  ondansetron (ZOFRAN) injection 4 mg (has no administration in time range)  heparin injection 5,000 Units (5,000 Units Subcutaneous Given 04/22/18 1403)  metoprolol succinate (TOPROL-XL) 24 hr tablet 50 mg (has no administration in time range)  0.9 %  sodium chloride infusion ( Intravenous New Bag/Given 04/22/18 1402)  aspirin chewable tablet 324 mg (324 mg Oral Given 04/22/18 0650)  morphine 4 MG/ML injection 4 mg (4 mg Intravenous Given 04/22/18 0650)  ondansetron (ZOFRAN) injection 4 mg (4 mg Intravenous Given 04/22/18 0651)     Initial Impression / Assessment and Plan / ED Course  I have reviewed the triage vital signs and the nursing notes.  Pertinent labs & imaging results that were available during my care of the patient were reviewed by me and considered in my medical decision making (see chart for details).  Clinical Course as of Apr 22 1550  Tue Apr 22, 2018  0822 Patient's chest pain resolved with morphine.  I spoke with Angie from cardiology who advises the team will consult and advise.   [AL]    Clinical Course User Index [AL] Frederica Kuster, PA-C    Patient with recurrence of chest pain after admission 1 month ago.  Initial troponin is negative, however cardiology evaluated the patient and would like to admit her for possible cath.  Cath was originally scheduled for 04/29/2018, however with recurrence, they may proceed sooner.  BMP shows BUN 30, creatinine 2.69, which is stable for the patient.  CBC unremarkable.  Chest x-ray shows cardiac enlargement, no evidence of active pulmonary disease.  Blunting of right costal angle which is unchanged from last study.  EKG shows NSR, left bundle branch block, but no change since last tracing.  Patient to be admitted by cardiology service and I appreciate their assistance with the patient.  Final Clinical Impressions(s) / ED Diagnoses   Final diagnoses:  Chest pain, unspecified type    ED Discharge  Orders    None       Frederica Kuster, PA-C 94/07/68 [id number]    Delora Fuel, MD 09/04/58 2249

## 2018-04-22 NOTE — ED Notes (Signed)
Cardiology @ bedside.

## 2018-04-22 NOTE — ED Notes (Signed)
Please call Shantrell Placzek (son) at [phone] when patient gets a bed.

## 2018-04-22 NOTE — ED Notes (Signed)
Heart healthy meal tray ordered at this time

## 2018-04-22 NOTE — ED Notes (Signed)
Cardiologist bedside for evaluation and planning

## 2018-04-23 DIAGNOSIS — R072 Precordial pain: Secondary | ICD-10-CM

## 2018-04-23 LAB — CBC
HEMATOCRIT: 33.1 % — AB (ref 36.0–46.0)
Hemoglobin: 10.6 g/dL — ABNORMAL LOW (ref 12.0–15.0)
MCH: 29.9 pg (ref 26.0–34.0)
MCHC: 32 g/dL (ref 30.0–36.0)
MCV: 93.2 fL (ref 78.0–100.0)
Platelets: 264 10*3/uL (ref 150–400)
RBC: 3.55 MIL/uL — ABNORMAL LOW (ref 3.87–5.11)
RDW: 13.7 % (ref 11.5–15.5)
WBC: 8 10*3/uL (ref 4.0–10.5)

## 2018-04-23 LAB — BASIC METABOLIC PANEL
Anion gap: 10 (ref 5–15)
BUN: 39 mg/dL — AB (ref 6–20)
CO2: 21 mmol/L — ABNORMAL LOW (ref 22–32)
Calcium: 8.9 mg/dL (ref 8.9–10.3)
Chloride: 112 mmol/L — ABNORMAL HIGH (ref 101–111)
Creatinine, Ser: 2.99 mg/dL — ABNORMAL HIGH (ref 0.44–1.00)
GFR calc Af Amer: 17 mL/min — ABNORMAL LOW (ref >60)
GFR, EST NON AFRICAN AMERICAN: 15 mL/min — AB (ref >60)
GLUCOSE: 93 mg/dL (ref 65–99)
POTASSIUM: 4.8 mmol/L (ref 3.5–5.1)
Sodium: 143 mmol/L (ref 135–145)

## 2018-04-23 LAB — TROPONIN I: Troponin I: 0.05 ng/mL (ref <0.03)

## 2018-04-23 MED ORDER — METOPROLOL SUCCINATE ER 100 MG PO TB24
100.0000 mg | ORAL_TABLET | Freq: Every day | ORAL | Status: DC
  Administered 2018-04-24 – 2018-04-26 (×3): 100 mg via ORAL
  Filled 2018-04-23 (×4): qty 1

## 2018-04-23 MED ORDER — FUROSEMIDE 10 MG/ML IJ SOLN: 40.0000 mg | Freq: Once | INTRAMUSCULAR | Status: DC

## 2018-04-23 MED ORDER — SODIUM CHLORIDE 0.9 % IV SOLN
INTRAVENOUS | Status: AC
  Administered 2018-04-23: 11:00:00 via INTRAVENOUS

## 2018-04-23 NOTE — Plan of Care (Signed)
  Problem: Education: Goal: Knowledge of General Education information will improve Outcome: Progressing   Problem: Health Behavior/Discharge Planning: Goal: Ability to manage health-related needs will improve Outcome: Progressing   Problem: Clinical Measurements: Goal: Ability to maintain clinical measurements within normal limits will improve Outcome: Progressing Goal: Will remain free from infection Outcome: Progressing Goal: Diagnostic test results will improve Outcome: Progressing Goal: Respiratory complications will improve Outcome: Progressing Goal: Cardiovascular complication will be avoided Outcome: Progressing   Problem: Activity: Goal: Risk for activity intolerance will decrease Outcome: Progressing   Problem: Nutrition: Goal: Adequate nutrition will be maintained Outcome: Progressing   Problem: Coping: Goal: Level of anxiety will decrease Outcome: Progressing   Problem: Elimination: Goal: Will not experience complications related to bowel motility Outcome: Progressing Goal: Will not experience complications related to urinary retention Outcome: Progressing   Problem: Pain Managment: Goal: General experience of comfort will improve Outcome: Progressing   Problem: Safety: Goal: Ability to remain free from injury will improve Outcome: Progressing   Problem: Skin Integrity: Goal: Risk for impaired skin integrity will decrease Outcome: Progressing   Pt. Able to rest in intervals thru out the night. Even and unlabored breathing. Telemetry monitoring in place. VS stable. Pt. NPO since midnight. Needs reinforcement. Hourly rounding completed. Bed locked and low. Call bell within reach. Bedside shift report will be given to oncoming nurse.

## 2018-04-23 NOTE — Progress Notes (Signed)
Pt. pulled out right forearm IV. Will contact IV team for new IV access for heart Cath later on today.

## 2018-04-23 NOTE — Progress Notes (Addendum)
Progress Note  Patient Name: KEYNA BLIZARD Date of Encounter: 04/23/2018  Primary Cardiologist: Dr. Daneen Schick III, MD  Subjective   Patient was feeling better when seen this morning.  She denies any more chest pain or shortness of breath.  According to patient she was walking around without any difficulty, she was saturating well on room air.  Inpatient Medications    Scheduled Meds: . aspirin EC  81 mg Oral QHS  . atorvastatin  40 mg Oral q1800  . heparin  5,000 Units Subcutaneous Q8H  . metoprolol succinate  50 mg Oral Daily  . multivitamin with minerals  0.5 tablet Oral QHS  . QUEtiapine  25 mg Oral QHS   Continuous Infusions:  PRN Meds: acetaminophen, nitroGLYCERIN, ondansetron (ZOFRAN) IV   Vital Signs    Vitals:   04/22/18 1812 04/22/18 1820 04/22/18 2009 04/23/18 0355  BP: 108/72  107/69 (!) 116/58  Pulse: 91  85 89  Resp:   16 16  Temp: 97.6 F (36.4 C)  97.6 F (36.4 C) 97.8 F (36.6 C)  TempSrc: Oral   Oral  SpO2: 98%  100% 96%  Weight:  133 lb 12.8 oz (60.7 kg)  133 lb 12.8 oz (60.7 kg)  Height:  5\' 4"  (1.626 m)      Intake/Output Summary (Last 24 hours) at 04/23/2018 0828 Last data filed at 04/22/2018 2100 Gross per 24 hour  Intake 240 ml  Output -  Net 240 ml    I/O since admission:   Filed Weights   04/22/18 0615 04/22/18 1820 04/23/18 0355  Weight: 135 lb (61.2 kg) 133 lb 12.8 oz (60.7 kg) 133 lb 12.8 oz (60.7 kg)    Telemetry     - Personally Reviewed.  Mostly remains in sinus rhythm.  ECG    ECG (independently read by me): No EKG today  Physical Exam   BP (!) 116/58 (BP Location: Right Arm)   Pulse 89   Temp 97.8 F (36.6 C) (Oral)   Resp 16   Ht 5\' 4"  (1.626 m)   Wt 133 lb 12.8 oz (60.7 kg)   SpO2 96%   BMI 22.97 kg/m  General: Alert, oriented to self, pleasant lady, no distress.  Skin: normal turgor, no rashes, warm and dry HEENT: Normocephalic, atraumatic.  Lungs: Decreased breath sounds at right base, no  crackles. Chest wall: without tenderness to palpitation Heart: PMI not displaced, RRR, S1-S2 and S3. Abdomen: soft, nontender; no hepatosplenomehaly, BS+;  Pulses 2+ Extremities: no clubbing cyanosis or edema, Homan's sign negative  Neurologic: grossly nonfocal; Cranial nerves grossly wnl Psychologic: Normal mood and affect.   Labs    Chemistry Recent Labs  Lab 04/22/18 0647 04/23/18 0048  NA 143 143  K 4.4 4.8  CL 112* 112*  CO2 20* 21*  GLUCOSE 110* 93  BUN 30* 39*  CREATININE 2.69* 2.99*  CALCIUM 9.7 8.9  GFRNONAA 16* 15*  GFRAA 19* 17*  ANIONGAP 11 10     Hematology Recent Labs  Lab 04/22/18 0647 04/23/18 0048  WBC 7.9 8.0  RBC 4.05 3.55*  HGB 12.2 10.6*  HCT 37.3 33.1*  MCV 92.1 93.2  MCH 30.1 29.9  MCHC 32.7 32.0  RDW 13.9 13.7  PLT 288 264    Cardiac Enzymes Recent Labs  Lab 04/22/18 1524 04/22/18 1945 04/23/18 0048  TROPONINI 0.05* 0.05* 0.05*    Recent Labs  Lab 04/22/18 0653  TROPIPOC 0.08     BNP Recent Labs  Lab  04/22/18 1524  BNP 3,923.9*     DDimer No results for input(s): DDIMER in the last 168 hours.   Lipid Panel     Component Value Date/Time   CHOL 175 03/25/2018 0216   TRIG 103 03/25/2018 0216   HDL 39 (L) 03/25/2018 0216   CHOLHDL 4.5 03/25/2018 0216   VLDL 21 03/25/2018 0216   LDLCALC 115 (H) 03/25/2018 0216    Radiology    Dg Chest Portable 1 View  Result Date: 04/22/2018 CLINICAL DATA:  Chest pain, shortness of breath, CHF. EXAM: PORTABLE CHEST 1 VIEW COMPARISON:  03/24/2018 FINDINGS: Cardiac enlargement. No vascular congestion. Blunting of the right costophrenic angle is unchanged since previous study. This may represent chronic pleural effusion or pleural thickening. Atelectasis in the right lung base. Left lung is clear and expanded. No pneumothorax. Mediastinal contours appear intact. Surgical clips in the right hilum. IMPRESSION: Cardiac enlargement. No evidence of active pulmonary disease. Blunting of the  right costophrenic angle unchanged since prior study, suggesting chronic pleural effusion or pleural thickening. Electronically Signed   By: Lucienne Capers M.D.   On: 04/22/2018 06:45    Cardiac Studies   Echocardiogram 03/03/18: Study Conclusions  - Left ventricle: LVEF is severely depressed at approximately 15% with diffuse hypokinesis; inferior akiensis. The cavity size was normal. Wall thickness was increased in a pattern of mild LVH. - Mitral valve: There was mild regurgitation. - Pericardium, extracardiac: A trivial pericardial effusion was identified.  Lexiscan Myoview stress test 03/25/18:  There was no ST segment deviation noted during stress.  Findings consistent with ischemia.  This is a high risk study.  The left ventricular ejection fraction is severely decreased (<30%).  Severe LVE with diffuse hypokinesis inferior basal dyskinesis EF 17% Poor quality study with stress images having diffusely less counts than rest Suggestion of septal apical distal anterior wall and inferior wall ischemia.  Patient Profile     72 y.o. female with a hx of CKD stage III, essential HTN, stage Ib lung cancer (s/p chemotherapy and surgical intervention), HLD, COPD, dementia and recent dx of severe chronic systolic heart failure with EF of 15% per echocardiogram by PCP. She had a recent hospitalization for acute CHF and NSTEMI with aborted cath (04/01/18) secondary to acute confusion and agitation however with presumed CAD.   Came to ED with one episode of chest pain shortness of breath, little hypoxic on presentation requiring 2 L of oxygen.  Assessment & Plan    Chest pain with recent NSTEMI with presumed CAD:  Troponin remained stable at 0.05.  Denies any more chest pain or shortness of breath.  Will need right and left heart catheterization once kidney function stabilizes with.  Patient was little tachycardic. -Increase the dose of metoprolol to 100 mg daily. -Monitor  blood pressure carefully. -Continue aspirin, and atorvastatin.  Acute on chronic combined CHF: Elevated BNP, but patient does not appear volume overload, saturating well on room air.  Worsening creatinine. -Gentle hydration with normal saline at 50 mL/h for 10-hour total of 500 mL. -Monitor renal function.  AKI with CKD III.  Creatinine trending up. We will gently rehydrate and monitor the function. She needs for right and left heart catheterization-catheterization will be scheduled with once renal function stabilizes.   Rollene Rotunda MD PGY2 04/23/2018, 8:28 AM    I have personally seen and examined this patient. I agree with the assessment and plan as outlined above.  She is admitted after an episode of chest pain but  no recurrence of pain. Troponin flat at 0.05.  She has LVEF 15%. This admission is similar to her admission several weeks ago. She has CKD and her creatinine is up to 2.9 (baseline 2.6-2.8).  Dr. Tamala Julian had planned an outpatient cath next week. We will consider cath this week if renal function improves. I don't want to plan a cath today with the renal function worsening over last 24 hours.  Will hydrate gently. She has no evidence of volume overload (baseline BNP is over 4500).  Re-evaluate in am with BMET.  If renal function remains the same and she has no chest pain, may discharge home tomorrow.   Lauree Chandler 04/23/2018 10:42 AM

## 2018-04-24 ENCOUNTER — Encounter (HOSPITAL_COMMUNITY)
Admission: EM | Disposition: A | Discharge status: Home Health | Payer: Self-pay | Source: Home / Self Care | Attending: Cardiovascular Disease

## 2018-04-24 ENCOUNTER — Other Ambulatory Visit: Payer: Self-pay

## 2018-04-24 DIAGNOSIS — I25118 Atherosclerotic heart disease of native coronary artery with other forms of angina pectoris: Secondary | ICD-10-CM

## 2018-04-24 DIAGNOSIS — I5022 Chronic systolic (congestive) heart failure: Secondary | ICD-10-CM

## 2018-04-24 HISTORY — PX: RIGHT/LEFT HEART CATH AND CORONARY ANGIOGRAPHY: CATH118266

## 2018-04-24 LAB — POCT I-STAT 3, VENOUS BLOOD GAS (G3P V)
Acid-base deficit: 7 mmol/L — ABNORMAL HIGH (ref 0.0–2.0)
BICARBONATE: 16.9 mmol/L — AB (ref 20.0–28.0)
O2 Saturation: 48 %
TCO2: 18 mmol/L — AB (ref 22–32)
pCO2, Ven: 28.3 mmHg — ABNORMAL LOW (ref 44.0–60.0)
pH, Ven: 7.384 (ref 7.250–7.430)
pO2, Ven: 26 mmHg — CL (ref 32.0–45.0)

## 2018-04-24 LAB — POCT I-STAT 3, ART BLOOD GAS (G3+)
Acid-base deficit: 8 mmol/L — ABNORMAL HIGH (ref 0.0–2.0)
BICARBONATE: 15.6 mmol/L — AB (ref 20.0–28.0)
O2 SAT: 92 %
PO2 ART: 62 mmHg — AB (ref 83.0–108.0)
TCO2: 16 mmol/L — ABNORMAL LOW (ref 22–32)
pCO2 arterial: 25.6 mmHg — ABNORMAL LOW (ref 32.0–48.0)
pH, Arterial: 7.392 (ref 7.350–7.450)

## 2018-04-24 LAB — BASIC METABOLIC PANEL
ANION GAP: 13 (ref 5–15)
BUN: 31 mg/dL — ABNORMAL HIGH (ref 6–20)
CALCIUM: 9.1 mg/dL (ref 8.9–10.3)
CO2: 19 mmol/L — ABNORMAL LOW (ref 22–32)
Chloride: 114 mmol/L — ABNORMAL HIGH (ref 101–111)
Creatinine, Ser: 2.37 mg/dL — ABNORMAL HIGH (ref 0.44–1.00)
GFR calc Af Amer: 22 mL/min — ABNORMAL LOW (ref >60)
GFR, EST NON AFRICAN AMERICAN: 19 mL/min — AB (ref >60)
GLUCOSE: 80 mg/dL (ref 65–99)
Potassium: 4.2 mmol/L (ref 3.5–5.1)
SODIUM: 146 mmol/L — AB (ref 135–145)

## 2018-04-24 SURGERY — RIGHT/LEFT HEART CATH AND CORONARY ANGIOGRAPHY
Anesthesia: LOCAL

## 2018-04-24 MED ORDER — SODIUM CHLORIDE 0.9 % IV SOLN: 250.0000 mL | INTRAVENOUS | Status: DC | PRN

## 2018-04-24 MED ORDER — ASPIRIN 81 MG PO CHEW: 81.0000 mg | CHEWABLE_TABLET | ORAL | Status: DC

## 2018-04-24 MED ORDER — SODIUM CHLORIDE 0.9% FLUSH: 3.0000 mL | INTRAVENOUS | Status: DC | PRN

## 2018-04-24 MED ORDER — LIDOCAINE HCL (PF) 1 % IJ SOLN
INTRAMUSCULAR | Status: AC
  Filled 2018-04-24: qty 30

## 2018-04-24 MED ORDER — SODIUM CHLORIDE 0.9% FLUSH
3.0000 mL | Freq: Two times a day (BID) | INTRAVENOUS | Status: DC
  Administered 2018-04-25 – 2018-04-26 (×2): 3 mL via INTRAVENOUS

## 2018-04-24 MED ORDER — LIDOCAINE HCL (PF) 1 % IJ SOLN
INTRAMUSCULAR | Status: DC | PRN
  Administered 2018-04-24: 20 mL

## 2018-04-24 MED ORDER — ACETAMINOPHEN 325 MG PO TABS: 650.0000 mg | ORAL_TABLET | ORAL | Status: DC | PRN

## 2018-04-24 MED ORDER — ASPIRIN 81 MG PO CHEW: 81.0000 mg | CHEWABLE_TABLET | Freq: Every day | ORAL | Status: DC

## 2018-04-24 MED ORDER — ONDANSETRON HCL 4 MG/2ML IJ SOLN: 4.0000 mg | Freq: Four times a day (QID) | INTRAMUSCULAR | Status: DC | PRN

## 2018-04-24 MED ORDER — SODIUM CHLORIDE 0.9% FLUSH: 3.0000 mL | Freq: Two times a day (BID) | INTRAVENOUS | Status: DC

## 2018-04-24 MED ORDER — HEPARIN (PORCINE) IN NACL 2-0.9 UNITS/ML
INTRAMUSCULAR | Status: AC | PRN
  Administered 2018-04-24: 1000 mL

## 2018-04-24 MED ORDER — SODIUM CHLORIDE 0.9 % IV SOLN: INTRAVENOUS | Status: AC

## 2018-04-24 MED ORDER — MORPHINE SULFATE (PF) 2 MG/ML IV SOLN: 2.0000 mg | INTRAVENOUS | Status: DC | PRN

## 2018-04-24 MED ORDER — IOHEXOL 350 MG/ML SOLN
INTRAVENOUS | Status: DC | PRN
  Administered 2018-04-24: 25 mL via INTRAVENOUS

## 2018-04-24 MED ORDER — SODIUM CHLORIDE 0.9 % IV SOLN
INTRAVENOUS | Status: DC
  Administered 2018-04-24: 12:00:00 via INTRAVENOUS

## 2018-04-24 MED ORDER — HEPARIN (PORCINE) IN NACL 1000-0.9 UT/500ML-% IV SOLN
INTRAVENOUS | Status: AC
  Filled 2018-04-24: qty 1000

## 2018-04-24 SURGICAL SUPPLY — 10 items
CATH INFINITI 5FR MULTPACK ANG (CATHETERS) ×2 IMPLANT
CATH SWAN GANZ 7F STRAIGHT (CATHETERS) ×2 IMPLANT
KIT HEART LEFT (KITS) ×2 IMPLANT
PACK CARDIAC CATHETERIZATION (CUSTOM PROCEDURE TRAY) ×2 IMPLANT
SHEATH PINNACLE 5F 10CM (SHEATH) ×2 IMPLANT
SHEATH PINNACLE 7F 10CM (SHEATH) ×2 IMPLANT
TRANSDUCER W/STOPCOCK (MISCELLANEOUS) ×2 IMPLANT
TUBING CIL FLEX 10 FLL-RA (TUBING) ×2 IMPLANT
WIRE EMERALD 3MM-J .035X150CM (WIRE) ×2 IMPLANT
WIRE HI TORQ VERSACORE-J 145CM (WIRE) ×2 IMPLANT

## 2018-04-24 NOTE — Progress Notes (Addendum)
Progress Note  Patient Name: Betty [name] Date of Encounter: 04/24/2018  Primary Cardiologist: Dr. Daneen Schick III, MD    Subjective   Patient has no complaints this morning.  She denies any chest pain or shortness of breath.  Denies any palpitations.  Inpatient Medications    Scheduled Meds: . aspirin EC  81 mg Oral QHS  . atorvastatin  40 mg Oral q1800  . heparin  5,000 Units Subcutaneous Q8H  . metoprolol succinate  100 mg Oral Daily  . multivitamin with minerals  0.5 tablet Oral QHS  . QUEtiapine  25 mg Oral QHS   Continuous Infusions:  PRN Meds: acetaminophen, nitroGLYCERIN, ondansetron (ZOFRAN) IV   Vital Signs    Vitals:   04/23/18 1419 04/23/18 1929 04/24/18 0445 04/24/18 0449  BP: 119/67 107/71  112/71  Pulse: (!) 105 (!) 107  (!) 117  Resp:  14  19  Temp: 97.6 F (36.4 C) 98.3 F (36.8 C)  (!) 97.5 F (36.4 C)  TempSrc: Oral Oral  Oral  SpO2: (!) 89% 93%  95%  Weight:   132 lb 6.4 oz (60.1 kg)   Height:        Intake/Output Summary (Last 24 hours) at 04/24/2018 0838 Last data filed at 04/23/2018 1809 Gross per 24 hour  Intake 634.65 ml  Output -  Net 634.65 ml    I/O since admission:   Filed Weights   04/22/18 1820 04/23/18 0355 04/24/18 0445  Weight: 133 lb 12.8 oz (60.7 kg) 133 lb 12.8 oz (60.7 kg) 132 lb 6.4 oz (60.1 kg)    Telemetry     - Personally Reviewed.  Sinus tachycardia with few PVCs  ECG    ECG (independently read by me): No EKG today  Physical Exam    BP 112/71 (BP Location: Left Arm)   Pulse (!) 117   Temp (!) 97.5 F (36.4 C) (Oral)   Resp 19   Ht 5\' 4"  (1.626 m)   Wt 132 lb 6.4 oz (60.1 kg)   SpO2 95%   BMI 22.73 kg/m  General: Alert, oriented, no distress.  Skin: normal turgor, no rashes, warm and dry HEENT: Normocephalic, atraumatic. Neck: No JVD, no carotid bruits; normal carotid upstroke Lungs: Mildly decreased breath sound at right base, no crackles. Chest wall: without tenderness to  palpitation Heart: PMI not displaced, sinus tachycardia, no murmur, no rubs, gallops, thrills, or heaves Abdomen: soft, nontender; no hepatosplenomehaly, BS+;  Pulses 2+ Extremities: no clubbing cyanosis or edema, Homan's sign negative  Neurologic: grossly nonfocal; Cranial nerves grossly wnl Psychologic: Normal mood and affect   Labs    Chemistry Recent Labs  Lab 04/22/18 0647 04/23/18 0048 04/24/18 0609  NA 143 143 146*  K 4.4 4.8 4.2  CL 112* 112* 114*  CO2 20* 21* 19*  GLUCOSE 110* 93 80  BUN 30* 39* 31*  CREATININE 2.69* 2.99* 2.37*  CALCIUM 9.7 8.9 9.1  GFRNONAA 16* 15* 19*  GFRAA 19* 17* 22*  ANIONGAP 11 10 13      Hematology Recent Labs  Lab 04/22/18 0647 04/23/18 0048  WBC 7.9 8.0  RBC 4.05 3.55*  HGB 12.2 10.6*  HCT 37.3 33.1*  MCV 92.1 93.2  MCH 30.1 29.9  MCHC 32.7 32.0  RDW 13.9 13.7  PLT 288 264    Cardiac Enzymes Recent Labs  Lab 04/22/18 1524 04/22/18 1945 04/23/18 0048  TROPONINI 0.05* 0.05* 0.05*    Recent Labs  Lab 04/22/18 0653  TROPIPOC 0.08  BNP Recent Labs  Lab 04/22/18 1524  BNP 3,923.9*     DDimer No results for input(s): DDIMER in the last 168 hours.   Lipid Panel     Component Value Date/Time   CHOL 175 03/25/2018 0216   TRIG 103 03/25/2018 0216   HDL 39 (L) 03/25/2018 0216   CHOLHDL 4.5 03/25/2018 0216   VLDL 21 03/25/2018 0216   LDLCALC 115 (H) 03/25/2018 0216    Radiology    No results found.  Cardiac Studies   Echocardiogram 03/03/18: Study Conclusions  - Left ventricle: LVEF is severely depressed at approximately 15% with diffuse hypokinesis; inferior akiensis. The cavity size was normal. Wall thickness was increased in a pattern of mild LVH. - Mitral valve: There was mild regurgitation. - Pericardium, extracardiac: A trivial pericardial effusion was identified.  Lexiscan Myoview stress test 03/25/18:  There was no ST segment deviation noted during stress.  Findings consistent  with ischemia.  This is a high risk study.  The left ventricular ejection fraction is severely decreased (<30%).  Severe LVE with diffuse hypokinesis inferior basal dyskinesis EF 17% Poor quality study with stress images having diffusely less counts than rest Suggestion of septal apical distal anterior wall and inferior wall ischemia.  Patient Profile     72 y.o. female with a hx of CKD stage III, essential HTN, stage Ib lung cancer (s/pchemotherapy and surgical intervention), HLD, COPD, dementiaand recent dx of severe chronic systolic heart failure with EF of 15% per echocardiogram by PCP. She had a recent hospitalization for acute CHF and NSTEMI with aborted cath (04/01/18) secondary to acute confusion and agitation however with presumed CAD. Came to ED with one episode of chest pain shortness of breath, little hypoxic on presentation requiring 2 L of oxygen.  Assessment & Plan    Chest pain with recent NSTEMI with presumed CAD:  Troponin remained stable at 0.05.  Denies any more chest pain or shortness of breath.  Renal function improved today-she can have her right and left cardiac catheterization in the afternoon.  Orders were placed, Dr. Angelena Form talked with the son who agreed with the plan. Remained tachycardic, yesterday she did not get 100 mg of metoprolol, first dose of 100 mg was given this morning.  She remained asymptomatic. -Continue metoprolol  100 mg daily. -Monitor blood pressure carefully. -Continue aspirin, and atorvastatin. -Right and left cardiac catheterization today.  Acute on chronic combined CHF: Elevated BNP, but patient does not appear volume overload, saturating well on room air.   -Monitor renal function.  AKI with CKD III.  Creatinine improved with gentle hydration, it was 2.37 today. -Keep monitoring renal function after cardiac catheterization today.  Betty Rotunda MD PGY2 04/24/2018, 8:38 AM    I have personally seen and examined this  patient. I agree with the assessment and plan as outlined above. No chest pain this am. Severe LV dysfunction with chest pain on admission. Dr. Tamala Julian had planned outpatient cath next week but she was admitted this week with recurrent pain. She has dementia and stage 3 CKD but given plans for cath as an outpatient, we will move forward with cath today since her renal function is much better. No PCI today. Just diagnostic right and left heart cath.   Betty [name] 04/24/2018 10:39 AM

## 2018-04-24 NOTE — Interval H&P Note (Signed)
Cath Lab Visit (complete for each Cath Lab visit)  Clinical Evaluation Leading to the Procedure:   ACS: Yes.    Non-ACS:    Anginal Classification: CCS III  Anti-ischemic medical therapy: Minimal Therapy (1 class of medications)  Non-Invasive Test Results: High-risk stress test findings: cardiac mortality >3%/year  Prior CABG: No previous CABG      History and Physical Interval Note:  04/24/2018 5:31 PM  Betty [name]  has presented today for surgery, with the diagnosis of hf - cad  The various methods of treatment have been discussed with the patient and family. After consideration of risks, benefits and other options for treatment, the patient has consented to  Procedure(s): RIGHT/LEFT HEART CATH AND CORONARY ANGIOGRAPHY (N/A) as a surgical intervention .  The patient's history has been reviewed, patient examined, no change in status, stable for surgery.  I have reviewed the patient's chart and labs.  Questions were answered to the patient's satisfaction.     Quay Burow

## 2018-04-24 NOTE — Care Management Note (Signed)
Case Management Note  Patient Details  Name: Betty MICHIELS MRN: [id number] Date of Birth: 06/02/1946  Subjective/Objective: Pt presented for Chest Pain. Pt was previously set up with [facility] for [facility] RN, PT and SW Services. Pt will need resumption orders / F2F once stable to transition home.                     Action/Plan: [facility] is aware that pt is hospitalized. CM will continue to monitor for additional needs.   Expected Discharge Date:                  Expected Discharge Plan:  [place]  In-House Referral:  NA  Discharge planning Services  CM Consult  Post Acute Care Choice:  Home Health, Resumption of Svcs/PTA Provider Choice offered to:  Patient, Adult Children  DME Arranged:  N/A DME Agency:  NA  HH Arranged:  RN, Disease Management, PT, Social Work [employer] Agency:  [place]  Status of Service:  Completed, signed off  If discussed at [employer], dates discussed:    Additional Comments:  [name], RN 04/24/2018, 1:36 PM

## 2018-04-24 NOTE — H&P (View-Only) (Signed)
Progress Note  Patient Name: Betty [name] Date of Encounter: 04/24/2018  Primary Cardiologist: Dr. Daneen Schick III, MD    Subjective   Patient has no complaints this morning.  She denies any chest pain or shortness of breath.  Denies any palpitations.  Inpatient Medications    Scheduled Meds: . aspirin EC  81 mg Oral QHS  . atorvastatin  40 mg Oral q1800  . heparin  5,000 Units Subcutaneous Q8H  . metoprolol succinate  100 mg Oral Daily  . multivitamin with minerals  0.5 tablet Oral QHS  . QUEtiapine  25 mg Oral QHS   Continuous Infusions:  PRN Meds: acetaminophen, nitroGLYCERIN, ondansetron (ZOFRAN) IV   Vital Signs    Vitals:   04/23/18 1419 04/23/18 1929 04/24/18 0445 04/24/18 0449  BP: 119/67 107/71  112/71  Pulse: (!) 105 (!) 107  (!) 117  Resp:  14  19  Temp: 97.6 F (36.4 C) 98.3 F (36.8 C)  (!) 97.5 F (36.4 C)  TempSrc: Oral Oral  Oral  SpO2: (!) 89% 93%  95%  Weight:   132 lb 6.4 oz (60.1 kg)   Height:        Intake/Output Summary (Last 24 hours) at 04/24/2018 0838 Last data filed at 04/23/2018 1809 Gross per 24 hour  Intake 634.65 ml  Output -  Net 634.65 ml    I/O since admission:   Filed Weights   04/22/18 1820 04/23/18 0355 04/24/18 0445  Weight: 133 lb 12.8 oz (60.7 kg) 133 lb 12.8 oz (60.7 kg) 132 lb 6.4 oz (60.1 kg)    Telemetry     - Personally Reviewed.  Sinus tachycardia with few PVCs  ECG    ECG (independently read by me): No EKG today  Physical Exam    BP 112/71 (BP Location: Left Arm)   Pulse (!) 117   Temp (!) 97.5 F (36.4 C) (Oral)   Resp 19   Ht 5\' 4"  (1.626 m)   Wt 132 lb 6.4 oz (60.1 kg)   SpO2 95%   BMI 22.73 kg/m  General: Alert, oriented, no distress.  Skin: normal turgor, no rashes, warm and dry HEENT: Normocephalic, atraumatic. Neck: No JVD, no carotid bruits; normal carotid upstroke Lungs: Mildly decreased breath sound at right base, no crackles. Chest wall: without tenderness to  palpitation Heart: PMI not displaced, sinus tachycardia, no murmur, no rubs, gallops, thrills, or heaves Abdomen: soft, nontender; no hepatosplenomehaly, BS+;  Pulses 2+ Extremities: no clubbing cyanosis or edema, Homan's sign negative  Neurologic: grossly nonfocal; Cranial nerves grossly wnl Psychologic: Normal mood and affect   Labs    Chemistry Recent Labs  Lab 04/22/18 0647 04/23/18 0048 04/24/18 0609  NA 143 143 146*  K 4.4 4.8 4.2  CL 112* 112* 114*  CO2 20* 21* 19*  GLUCOSE 110* 93 80  BUN 30* 39* 31*  CREATININE 2.69* 2.99* 2.37*  CALCIUM 9.7 8.9 9.1  GFRNONAA 16* 15* 19*  GFRAA 19* 17* 22*  ANIONGAP 11 10 13      Hematology Recent Labs  Lab 04/22/18 0647 04/23/18 0048  WBC 7.9 8.0  RBC 4.05 3.55*  HGB 12.2 10.6*  HCT 37.3 33.1*  MCV 92.1 93.2  MCH 30.1 29.9  MCHC 32.7 32.0  RDW 13.9 13.7  PLT 288 264    Cardiac Enzymes Recent Labs  Lab 04/22/18 1524 04/22/18 1945 04/23/18 0048  TROPONINI 0.05* 0.05* 0.05*    Recent Labs  Lab 04/22/18 0653  TROPIPOC 0.08  BNP Recent Labs  Lab 04/22/18 1524  BNP 3,923.9*     DDimer No results for input(s): DDIMER in the last 168 hours.   Lipid Panel     Component Value Date/Time   CHOL 175 03/25/2018 0216   TRIG 103 03/25/2018 0216   HDL 39 (L) 03/25/2018 0216   CHOLHDL 4.5 03/25/2018 0216   VLDL 21 03/25/2018 0216   LDLCALC 115 (H) 03/25/2018 0216    Radiology    No results found.  Cardiac Studies   Echocardiogram 03/03/18: Study Conclusions  - Left ventricle: LVEF is severely depressed at approximately 15% with diffuse hypokinesis; inferior akiensis. The cavity size was normal. Wall thickness was increased in a pattern of mild LVH. - Mitral valve: There was mild regurgitation. - Pericardium, extracardiac: A trivial pericardial effusion was identified.  Lexiscan Myoview stress test 03/25/18:  There was no ST segment deviation noted during stress.  Findings consistent  with ischemia.  This is a high risk study.  The left ventricular ejection fraction is severely decreased (<30%).  Severe LVE with diffuse hypokinesis inferior basal dyskinesis EF 17% Poor quality study with stress images having diffusely less counts than rest Suggestion of septal apical distal anterior wall and inferior wall ischemia.  Patient Profile     72 y.o. female with a hx of CKD stage III, essential HTN, stage Ib lung cancer (s/pchemotherapy and surgical intervention), HLD, COPD, dementiaand recent dx of severe chronic systolic heart failure with EF of 15% per echocardiogram by PCP. She had a recent hospitalization for acute CHF and NSTEMI with aborted cath (04/01/18) secondary to acute confusion and agitation however with presumed CAD. Came to ED with one episode of chest pain shortness of breath, little hypoxic on presentation requiring 2 L of oxygen.  Assessment & Plan    Chest pain with recent NSTEMI with presumed CAD:  Troponin remained stable at 0.05.  Denies any more chest pain or shortness of breath.  Renal function improved today-she can have her right and left cardiac catheterization in the afternoon.  Orders were placed, Dr. Angelena Form talked with the son who agreed with the plan. Remained tachycardic, yesterday she did not get 100 mg of metoprolol, first dose of 100 mg was given this morning.  She remained asymptomatic. -Continue metoprolol  100 mg daily. -Monitor blood pressure carefully. -Continue aspirin, and atorvastatin. -Right and left cardiac catheterization today.  Acute on chronic combined CHF: Elevated BNP, but patient does not appear volume overload, saturating well on room air.   -Monitor renal function.  AKI with CKD III.  Creatinine improved with gentle hydration, it was 2.37 today. -Keep monitoring renal function after cardiac catheterization today.  Rollene Rotunda MD PGY2 04/24/2018, 8:38 AM    I have personally seen and examined this  patient. I agree with the assessment and plan as outlined above. No chest pain this am. Severe LV dysfunction with chest pain on admission. Dr. Tamala Julian had planned outpatient cath next week but she was admitted this week with recurrent pain. She has dementia and stage 3 CKD but given plans for cath as an outpatient, we will move forward with cath today since her renal function is much better. No PCI today. Just diagnostic right and left heart cath.   Lauree Chandler 04/24/2018 10:39 AM

## 2018-04-25 ENCOUNTER — Inpatient Hospital Stay (HOSPITAL_COMMUNITY): Payer: Medicare Other

## 2018-04-25 ENCOUNTER — Encounter (HOSPITAL_COMMUNITY): Payer: Self-pay | Admitting: Cardiovascular Disease

## 2018-04-25 ENCOUNTER — Other Ambulatory Visit: Payer: Self-pay

## 2018-04-25 DIAGNOSIS — I428 Other cardiomyopathies: Secondary | ICD-10-CM

## 2018-04-25 LAB — BASIC METABOLIC PANEL
Anion gap: 9 (ref 5–15)
BUN: 26 mg/dL — ABNORMAL HIGH (ref 6–20)
CALCIUM: 9.1 mg/dL (ref 8.9–10.3)
CO2: 19 mmol/L — AB (ref 22–32)
CREATININE: 2.12 mg/dL — AB (ref 0.44–1.00)
Chloride: 117 mmol/L — ABNORMAL HIGH (ref 101–111)
GFR calc Af Amer: 25 mL/min — ABNORMAL LOW (ref >60)
GFR calc non Af Amer: 22 mL/min — ABNORMAL LOW (ref >60)
Glucose, Bld: 75 mg/dL (ref 65–99)
Potassium: 4.3 mmol/L (ref 3.5–5.1)
SODIUM: 145 mmol/L (ref 135–145)

## 2018-04-25 LAB — CBC
HCT: 35 % — ABNORMAL LOW (ref 36.0–46.0)
Hemoglobin: 11.4 g/dL — ABNORMAL LOW (ref 12.0–15.0)
MCH: 29.3 pg (ref 26.0–34.0)
MCHC: 32.6 g/dL (ref 30.0–36.0)
MCV: 90 fL (ref 78.0–100.0)
PLATELETS: 293 10*3/uL (ref 150–400)
RBC: 3.89 MIL/uL (ref 3.87–5.11)
RDW: 13.7 % (ref 11.5–15.5)
WBC: 7.4 10*3/uL (ref 4.0–10.5)

## 2018-04-25 MED ORDER — FUROSEMIDE 40 MG PO TABS
40.0000 mg | ORAL_TABLET | Freq: Every day | ORAL | Status: DC
  Administered 2018-04-25 – 2018-04-26 (×2): 40 mg via ORAL
  Filled 2018-04-25 (×2): qty 1

## 2018-04-25 MED ORDER — ISOSORB DINITRATE-HYDRALAZINE 20-37.5 MG PO TABS
1.0000 | ORAL_TABLET | Freq: Every day | ORAL | Status: DC
  Administered 2018-04-25 – 2018-04-26 (×2): 1 via ORAL
  Filled 2018-04-25 (×2): qty 1

## 2018-04-25 NOTE — Plan of Care (Signed)

## 2018-04-25 NOTE — Care Management Important Message (Signed)
Important Message  Patient Details  Name: Betty [name] MRN: [id number] Date of Birth: [birth year]   Medicare Important Message Given:  Yes    Barb Merino Quinterrius Errington 04/25/2018, 12:49 PM

## 2018-04-25 NOTE — Progress Notes (Signed)
Pharmacist Heart Failure Core Measure Documentation  Assessment: Betty [name] has an EF documented as 15% on 03/03/18 by Echo.  Rationale: Heart failure patients with left ventricular systolic dysfunction (LVSD) and an EF < 40% should be prescribed an angiotensin converting enzyme inhibitor (ACEI) or angiotensin receptor blocker (ARB) at discharge unless a contraindication is documented in the medical record.  This patient is not currently on an ACEI or ARB for HF.  This note is being placed in the record in order to provide documentation that a contraindication to the use of these agents is present for this encounter.  ACE Inhibitor or Angiotensin Receptor Blocker is contraindicated (specify all that apply)  []   ACEI allergy AND ARB allergy []   Angioedema []   Moderate or severe aortic stenosis []   Hyperkalemia []   Hypotension []   Renal artery stenosis [x]   Worsening renal function, preexisting renal disease or dysfunction   Hildred Laser, PharmD Clinical Pharmacist Clinical phone from 8:30-4:00 is [phone] After 4pm, please call Main [facility] [phone]) for assistance. 04/25/2018 8:21 AM

## 2018-04-25 NOTE — Progress Notes (Addendum)
Progress Note  Patient Name: Betty [name] Date of Encounter: 04/25/2018  Primary Cardiologist: Dr. Daneen Schick III, MD  Subjective   Patient was having mild shortness of breath, denies any chest pain.  Denies any palpitations.  She does not remember having cardiac catheterization yesterday.  Inpatient Medications    Scheduled Meds: . aspirin EC  81 mg Oral QHS  . atorvastatin  40 mg Oral q1800  . heparin  5,000 Units Subcutaneous Q8H  . metoprolol succinate  100 mg Oral Daily  . multivitamin with minerals  0.5 tablet Oral QHS  . QUEtiapine  25 mg Oral QHS  . sodium chloride flush  3 mL Intravenous Q12H   Continuous Infusions: . sodium chloride     PRN Meds: sodium chloride, acetaminophen, acetaminophen, morphine injection, nitroGLYCERIN, ondansetron (ZOFRAN) IV, ondansetron (ZOFRAN) IV, sodium chloride flush   Vital Signs    Vitals:   04/24/18 1858 04/24/18 2049 04/25/18 0513 04/25/18 0827  BP: 109/79 127/75 116/71   Pulse:  (!) 101 99 (!) 101  Resp:  18 18   Temp:  98 F (36.7 C) (!) 97.5 F (36.4 C)   TempSrc:  Oral Oral   SpO2:  98% 95%   Weight:   130 lb (59 kg)   Height:        Intake/Output Summary (Last 24 hours) at 04/25/2018 0855 Last data filed at 04/24/2018 1300 Gross per 24 hour  Intake 0 ml  Output -  Net 0 ml    I/O since admission:   Filed Weights   04/23/18 0355 04/24/18 0445 04/25/18 0513  Weight: 133 lb 12.8 oz (60.7 kg) 132 lb 6.4 oz (60.1 kg) 130 lb (59 kg)    Telemetry     - Personally Reviewed.  Mostly sinus tachycardia with prolonged QTC, one episode of nonsustained V. Tach.  ECG    ECG (independently read by me):NO EKG today.  Physical Exam   BP 116/71 (BP Location: Left Arm)   Pulse (!) 101   Temp (!) 97.5 F (36.4 C) (Oral)   Resp 18   Ht 5\' 4"  (1.626 m)   Wt 130 lb (59 kg)   SpO2 95%   BMI 22.31 kg/m  General: Alert, oriented, no distress.  Skin: normal turgor, no rashes, warm and dry HEENT: Normocephalic,  atraumatic.  Neck: No JVD, no carotid bruits; normal carotid upstroke Lungs: clear to ausculatation and percussion; no wheezing or rales Chest wall: without tenderness to palpitation Heart: Sinus tachycardia, no murmur,  rubs, gallops, Abdomen: soft, nontender; no hepatosplenomehaly, BS+; Extremities: no clubbing cyanosis or edema, Homan's sign negative  Neurologic: grossly nonfocal; Cranial nerves grossly wnl Psychologic: Normal mood and affect   Labs    Chemistry Recent Labs  Lab 04/22/18 0647 04/23/18 0048 04/24/18 0609  NA 143 143 146*  K 4.4 4.8 4.2  CL 112* 112* 114*  CO2 20* 21* 19*  GLUCOSE 110* 93 80  BUN 30* 39* 31*  CREATININE 2.69* 2.99* 2.37*  CALCIUM 9.7 8.9 9.1  GFRNONAA 16* 15* 19*  GFRAA 19* 17* 22*  ANIONGAP 11 10 13      Hematology Recent Labs  Lab 04/22/18 0647 04/23/18 0048 04/25/18 0732  WBC 7.9 8.0 7.4  RBC 4.05 3.55* 3.89  HGB 12.2 10.6* 11.4*  HCT 37.3 33.1* 35.0*  MCV 92.1 93.2 90.0  MCH 30.1 29.9 29.3  MCHC 32.7 32.0 32.6  RDW 13.9 13.7 13.7  PLT 288 264 293    Cardiac Enzymes Recent  Labs  Lab 04/22/18 1524 04/22/18 1945 04/23/18 0048  TROPONINI 0.05* 0.05* 0.05*    Recent Labs  Lab 04/22/18 0653  TROPIPOC 0.08     BNP Recent Labs  Lab 04/22/18 1524  BNP 3,923.9*     DDimer No results for input(s): DDIMER in the last 168 hours.   Lipid Panel     Component Value Date/Time   CHOL 175 03/25/2018 0216   TRIG 103 03/25/2018 0216   HDL 39 (L) 03/25/2018 0216   CHOLHDL 4.5 03/25/2018 0216   VLDL 21 03/25/2018 0216   LDLCALC 115 (H) 03/25/2018 0216    Radiology    No results found.  Cardiac Studies   Echocardiogram 03/03/18: Study Conclusions  - Left ventricle: LVEF is severely depressed at approximately 15% with diffuse hypokinesis; inferior akiensis. The cavity size was normal. Wall thickness was increased in a pattern of mild LVH. - Mitral valve: There was mild regurgitation. - Pericardium,  extracardiac: A trivial pericardial effusion was identified.  Lexiscan Myoview stress test 03/25/18:  There was no ST segment deviation noted during stress.  Findings consistent with ischemia.  This is a high risk study.  The left ventricular ejection fraction is severely decreased (<30%).  Severe LVE with diffuse hypokinesis inferior basal dyskinesis EF 17% Poor quality study with stress images having diffusely less counts than rest Suggestion of septal apical distal anterior wall and inferior wall ischemia.  Right and Left Heart Cath. Conclusion     Prox RCA lesion is 90% stenosed.  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  Continue medical therapy for nonischemic cardia myopathy will be recommended.  The sheaths were removed and pressure held on the groin to achieve hemostasis.  The patient left the lab in stable condition.      Patient Profile     72 y.o. female a hx of CKD stage III, essential HTN, stage Ib lung cancer (s/pchemotherapy and surgical intervention), HLD, COPD, dementiaand recent dx of severe chronic systolic heart failure with EF of 15% per echocardiogram by PCP. She had a recent hospitalization for acute CHF and NSTEMI with aborted cath (04/01/18) secondary to acute confusion and agitation however with presumed CAD. Came to ED with one episode of chest pain shortness of breath,little hypoxic on presentation requiring 2 L of oxygen.  Assessment & Plan    Chest pain with recent NSTEMI with presumed ZOX:WRUEAVW denies any more chest pain, having mild shortness of breath since yesterday. She had her diagnostic right and left heart catheterization yesterday which shows 90% stenosis of proximal RCA, she is having dilated nonischemic cardiomyopathy and medical management was recommended. She continue to have mild tachycardia. -Continue Toprol 100 mg  daily. -Add Bidil and lasix 40 mg daily. -Monitor blood pressure carefully. -Continue aspirin, and atorvastatin.  Acute on chronic combined [id number] BNP,but patient does not appear volume overload,saturating well on room air. -Monitor renal function.  AKI with CKD III.Creatinine improved , it was 2.12 today after right and left cardiac catheterization. -Continue to monitor renal function.  Rollene Rotunda MD PGY2 04/25/2018, 8:55 AM    I have personally seen and examined this patient. I agree with the assessment and plan as outlined above.  Cardiac cath yesterday with disease in the small non-dominant RCA but otherwise no severe disease. Her LAD and Circumflex are widely patent. Her cardiomyopathy is non-ischemic. She has dementia. She has some dyspnea. No chest  pain. Wedge pressure elevated on cath but no evidence of volume overload on exam. Renal function stable post cath Will treat NICM with beta blocker. Will not add Ace-inh or ARB due to CKD. Will add Bidil Will start low dose Lasix 40 mg daily today Follow today given med changes in this elderly female BMET in am Brain MRI given confusion (son asks for this and states she has been very confused over last month but this is a big change for her).   Lauree Chandler 04/25/2018 10:33 AM

## 2018-04-26 LAB — CBC
HEMATOCRIT: 33.2 % — AB (ref 36.0–46.0)
Hemoglobin: 10.9 g/dL — ABNORMAL LOW (ref 12.0–15.0)
MCH: 29.6 pg (ref 26.0–34.0)
MCHC: 32.8 g/dL (ref 30.0–36.0)
MCV: 90.2 fL (ref 78.0–100.0)
PLATELETS: 284 10*3/uL (ref 150–400)
RBC: 3.68 MIL/uL — AB (ref 3.87–5.11)
RDW: 13.8 % (ref 11.5–15.5)
WBC: 7.7 10*3/uL (ref 4.0–10.5)

## 2018-04-26 LAB — BASIC METABOLIC PANEL
Anion gap: 12 (ref 5–15)
BUN: 32 mg/dL — AB (ref 6–20)
CO2: 18 mmol/L — ABNORMAL LOW (ref 22–32)
CREATININE: 2.34 mg/dL — AB (ref 0.44–1.00)
Calcium: 9.2 mg/dL (ref 8.9–10.3)
Chloride: 114 mmol/L — ABNORMAL HIGH (ref 101–111)
GFR, EST AFRICAN AMERICAN: 23 mL/min — AB (ref >60)
GFR, EST NON AFRICAN AMERICAN: 20 mL/min — AB (ref >60)
Glucose, Bld: 88 mg/dL (ref 65–99)
POTASSIUM: 4 mmol/L (ref 3.5–5.1)
SODIUM: 144 mmol/L (ref 135–145)

## 2018-04-26 MED ORDER — NITROGLYCERIN 0.4 MG SL SUBL: 0.4000 mg | SUBLINGUAL_TABLET | SUBLINGUAL | 12 refills | Status: DC | PRN

## 2018-04-26 MED ORDER — FUROSEMIDE 40 MG PO TABS: 40.0000 mg | ORAL_TABLET | Freq: Every day | ORAL | 3 refills | Status: DC

## 2018-04-26 NOTE — Progress Notes (Signed)
Subjective:  She does not know where she is at this morning. No complaints of shortness of breath or chest pain.Cath showed a proximal RCA stenosis in a nondominant vessel and medical therapy was recommended.  Renal function a little worse today but similar to where it is at baseline.  Has significant dementia.  Objective:  Vital Signs in the last 24 hours: BP 110/71   Pulse 95   Temp 98.2 F (36.8 C) (Oral)   Resp 20   Ht 5\' 4"  (1.626 m)   Wt 59 kg (130 lb)   SpO2 98%   BMI 22.31 kg/m   Physical Exam: Elderly black female in no acute distress Lungs:  Clear Cardiac:  Regular rhythm, normal S1 and S2, no S3 Abdomen:  Soft, nontender, no masses Extremities:  No edema present  Intake/Output from previous day: 06/21 0701 - 06/22 0700 In: 480 [P.O.:480] Out: -   Weight Filed Weights   04/23/18 0355 04/24/18 0445 04/25/18 0513  Weight: 60.7 kg (133 lb 12.8 oz) 60.1 kg (132 lb 6.4 oz) 59 kg (130 lb)    Lab Results: Basic Metabolic Panel: Recent Labs    04/25/18 0732 04/26/18 0443  NA 145 144  K 4.3 4.0  CL 117* 114*  CO2 19* 18*  GLUCOSE 75 88  BUN 26* 32*  CREATININE 2.12* 2.34*   CBC: Recent Labs    04/25/18 0732 04/26/18 0443  WBC 7.4 7.7  HGB 11.4* 10.9*  HCT 35.0* 33.2*  MCV 90.0 90.2  PLT 293 284   Telemetry: Sinus rhythm with wide QRS complex  Assessment/Plan:  1.  Nonischemic cardiomyopathy 2.  Acute on chronic combined heart failure 3.  Chronic kidney disease stage 3-4 creatinine somewhat worse today but similar to previous 4.  DementiaMRI Showed volume loss and occluded vertebral  Recommendations:  I discussed with Dr. Rayann Heman of EP about possibly seeing for resynchronization per Dr. Thompson Caul note.  He would recommend that this be considered as an outpatient which I think is agreeable in light of her dementia.  I think she can go home today and follow-up her renal function as an outpatient.  She says that she was living alone but with her  dementia and previous psychiatric consult not sure that this is a good situation.  Kerry Hough  MD [facility] Card[facility]  04/26/2018, 10:34 AM

## 2018-04-26 NOTE — Progress Notes (Signed)
Discharge instruction was given to pt and family.  Her brother and sister in law at bedside to take her home. Informed them that she would need supervision 24/7 in addition to the [place] care.  Also, this nurse called her son, Chrissie Noa to inform of her discharge and needs of 24/7 care by family at home.  Family verbalized understanding.  Idolina Primer, RN

## 2018-04-26 NOTE — Discharge Summary (Addendum)
Discharge Summary    Patient ID: Betty [name],  MRN: [id number], DOB/AGE: Dec 26, 1945 72 y.o.  Admit date: 04/22/2018 Discharge date: 04/26/2018  Primary Care Provider: Josetta Huddle Primary Cardiologist: Skeet Latch, MD  Discharge Diagnoses    Active Problems:   Chest pain   Chronic systolic heart failure (HCC)   Allergies Allergies  Allergen Reactions  . Sulfa Antibiotics Itching    Diagnostic Studies/Procedures    RIGHT/LEFT HEART CATH AND CORONARY ANGIOGRAPHY  04/24/18  Conclusion    Prox RCA lesion is 90% stenosed.   Betty [name] is a 72 y.o. female   IMPRESSION: Betty [name] has a dilated nonischemic cardia myopathy with elevated filling pressures, high wedge pressures as well as low cardiac output.  Continue medical therapy for nonischemic cardia myopathy will be recommended.  The sheaths were removed and pressure held on the groin to achieve hemostasis.  The patient left the lab in stable condition. Quay Burow. MD, [place]       _____________   History of Present Illness    Betty [name] is a 72 y.o.femalewith a hx of CKD stage III, essential HTN, stage Ib lung cancer (s/pchemotherapy and surgical intervention), HLD, COPD, dementiaand recent dx of severe chronic systolic heart failure with EF of 15% per echocardiogram by PCP. She had a recent hospitalization for acute CHF and NSTEMI with aborted cath (04/01/18) secondary to acute confusion and agitation however with presumed CAD.  Betty [name] presented to [facility] on 04/22/18 after son found her sitting on the side of the bed early on that morning with complaints of central and left sided chest pain. Due to her dementia, she is a poor historian and cannot reliably tell her story. Son at bedside to help with history of events. He stated that he was drawn to her bedroom after hearing her cry out in pain around 3AM this morning. She was unable to rate the pain, as she did not remember  that she had it. He reported that she had associated weakness and nausea, but no vomiting. He denied that she was diaphoretic, dizzy or experienced syncope. She has known hx of recent dx of systolic CHF (KW40%) and sustained a NSTEMI earlier this month with plans for further ischemic cardiac evaluation with R/LHC on 04/29/18. Given her hx and recent symptoms patient's son brought her to the ED for further evaluation.   Of note, the patient was last seen in the office by Dr. Tamala Julian on 04/09/18 for hospital follow up. Prior to this, she presented to her PCP office with increased SOB over one month in which an echocardiogram was performed (03/03/18) while awaiting an appointment with Cardiology. The echo revealed an EF of 15% with diffuse hypokinesis, inferior hyopkinesis. Unfortunately she became acutely ill and required hospitalization for acute on chronic systolic heart failure and NSTEMI (discharged 04/02/18). An NST was therefore performed on 03/25/18 in which there were poor quality stress images with evidence suggestive of septal, apical, distal, anterior and inferior wall ischemia. A LHC was attempted on 03/28/18 given the results of the NST, however was aborted given her confusion, aggression and CKD III. Therefore, appropriate medical therapy was started. After discharge, she was then seen by Dr. Tamala Julian with recommendations to proceed with [facility] scheduled for 04/29/18.   In the ED, an iStat trop was drawn which is elevated at 0.08. CXR revealed no evidence of acute pulmonary disease. She has been mildly hypoxic while in the ED, requiring  3L [place] to keep O2 saturations greater than 90%. She does not currently wear supplemental home O2. She was given ASA 324mg , morphine and Zofran with pain resolution.    Hospital Course     Consultants: None  Troponins remained mildly elevated and flat pattern, 0.05.  She was able to undergo right and left heart cath on 03/24/2018 that revealed 90% stenosis of the  small, nondominant proximal RCA.  Her LAD and circumflex are widely patent.  Her  cardiomyopathy is nonischemic.  Her wedge pressure was elevated but no evidence of volume overload on exam.  Renal function has remained stable.  Medical management was recommended.  She has denied any further chest pain.  She was mildly tachycardic.  She continues on medical therapy with Toprol 100 mg daily, aspirin and atorvastatin.  BiDil and Lasix 40 mg daily have been added.  She is not on ACE inhibitor or ARB due to CKD.    There was consideration of resynchronization therapy per Dr. Thompson Caul note.  Dr. Wynonia Lawman discussed this with Dr. Milta Deiters of EP who recommended that this be considered as an outpatient.  She continues to be confused.  MRI of the brain showed volume loss and occluded cerebral artery.  She has been seen by case management and she is to be discharged home with family supervision.  Orders have been placed to resume home PT and nursing.  She will need follow-up of her renal function as an outpatient.  Patient has been seen by Dr. Wynonia Lawman today and deemed ready for discharge home. All follow up appointments have been scheduled. Discharge medications are listed below.  _____________  Discharge Vitals Blood pressure 110/71, pulse 95, temperature 98.2 F (36.8 C), temperature source Oral, resp. rate 20, height 5\' 4"  (1.626 m), weight 130 lb (59 kg), SpO2 98 %.  Filed Weights   04/23/18 0355 04/24/18 0445 04/25/18 0513  Weight: 133 lb 12.8 oz (60.7 kg) 132 lb 6.4 oz (60.1 kg) 130 lb (59 kg)    Labs & Radiologic Studies    CBC Recent Labs    04/25/18 0732 04/26/18 0443  WBC 7.4 7.7  HGB 11.4* 10.9*  HCT 35.0* 33.2*  MCV 90.0 90.2  PLT 293 833   Basic Metabolic Panel Recent Labs    04/25/18 0732 04/26/18 0443  NA 145 144  K 4.3 4.0  CL 117* 114*  CO2 19* 18*  GLUCOSE 75 88  BUN 26* 32*  CREATININE 2.12* 2.34*  CALCIUM 9.1 9.2   Liver Function Tests No results for input(s):  AST, ALT, ALKPHOS, BILITOT, PROT, ALBUMIN in the last 72 hours. No results for input(s): LIPASE, AMYLASE in the last 72 hours. Cardiac Enzymes No results for input(s): CKTOTAL, CKMB, CKMBINDEX, TROPONINI in the last 72 hours. BNP Invalid input(s): POCBNP D-Dimer No results for input(s): DDIMER in the last 72 hours. Hemoglobin A1C No results for input(s): HGBA1C in the last 72 hours. Fasting Lipid Panel No results for input(s): CHOL, HDL, LDLCALC, TRIG, CHOLHDL, LDLDIRECT in the last 72 hours. Thyroid Function Tests No results for input(s): TSH, T4TOTAL, T3FREE, THYROIDAB in the last 72 hours.  Invalid input(s): FREET3 _____________  Mr Herby Abraham Contrast  Result Date: 04/25/2018 CLINICAL DATA:  Dementia, suspected Alzheimer's disease. History of lung cancer, hypertension, hypercholesterolemia. EXAM: MRI HEAD WITHOUT CONTRAST TECHNIQUE: Multiplanar, multiecho pulse sequences of the brain and surrounding structures were obtained without intravenous contrast. COMPARISON:  None. FINDINGS: INTRACRANIAL CONTENTS: No reduced diffusion to suggest acute ischemia. No susceptibility artifact  to suggest hemorrhage. Moderate global parenchymal brain volume loss. No hydrocephalus. Confluent T2 hyperintense signal bilateral basal ganglia and lesser extent thalami and prominent perivascular spaces, seen with chronic small vessel ischemic changes and chronic hypertension. No abnormal extra-axial fluid collections. No extra-axial masses. VASCULAR: Absent LEFT vertebral artery flow void. 8 mm RIGHT MCA bifurcation aneurysm. SKULL AND UPPER CERVICAL SPINE: No abnormal sellar expansion. No suspicious calvarial bone marrow signal. Craniocervical junction maintained. Degenerative cervical spine resulting in at least moderate canal stenosis C4-5. SINUSES/ORBITS: Small LEFT frontal mucosal retention cysts. The mastoid air-cells and included paranasal sinuses are otherwise well-aerated.The included ocular globes and orbital  contents are non-suspicious. OTHER: None. IMPRESSION: 1. No acute intracranial process. 2. 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 For constellation findings, recommend CTA HEAD and neck. 3. Moderate global parenchymal brain volume loss. 4. Moderate chronic small vessel ischemic changes. 5. At least moderate C4-5 canal stenosis . Electronically Signed   By: Elon Alas M.D.   On: 04/25/2018 18:15   Dg Chest Portable 1 View  Result Date: 04/22/2018 CLINICAL DATA:  Chest pain, shortness of breath, CHF. EXAM: PORTABLE CHEST 1 VIEW COMPARISON:  03/24/2018 FINDINGS: Cardiac enlargement. No vascular congestion. Blunting of the right costophrenic angle is unchanged since previous study. This may represent chronic pleural effusion or pleural thickening. Atelectasis in the right lung base. Left lung is clear and expanded. No pneumothorax. Mediastinal contours appear intact. Surgical clips in the right hilum. IMPRESSION: Cardiac enlargement. No evidence of active pulmonary disease. Blunting of the right costophrenic angle unchanged since prior study, suggesting chronic pleural effusion or pleural thickening. Electronically Signed   By: Lucienne Capers M.D.   On: 04/22/2018 06:45   Disposition   Pt is being discharged home today in good condition.  Follow-up Plans & Appointments    Follow-up Information    [facility].   Specialty:  [place] Why:  Registered Nurse, Physical Therapy, Social Worker Contact information: [address] [place] [address] [phone]        Isaiah Serge, NP Follow up.   Specialties:  Cardiology, Radiology Why:  Cardiology hospital follow up on 05/15/18 at 9:30. Please arrive 15 minutes early for check in.  Contact information: [address] [place] [address] [phone]          Discharge Instructions    (HEART FAILURE PATIENTS) Call MD:  Anytime you have any of the  following symptoms: 1) 3 pound weight gain in 24 hours or 5 pounds in 1 week 2) shortness of breath, with or without a dry hacking cough 3) swelling in the hands, feet or stomach 4) if you have to sleep on extra pillows at night in order to breathe.   Complete by:  As directed    Diet - low sodium heart healthy   Complete by:  As directed    Discharge instructions   Complete by:  As directed    PLEASE REMEMBER TO BRING ALL OF YOUR MEDICATIONS TO EACH OF YOUR FOLLOW-UP OFFICE VISITS.  PLEASE ATTEND ALL SCHEDULED FOLLOW-UP APPOINTMENTS.   Activity: Increase activity slowly as tolerated. You may shower, but no soaking baths (or swimming) for 1 week. No driving for 24 hours. No lifting over 5 lbs for 1 week. No sexual activity for 1 week.   You May Return to Work: in 1 week (if applicable)  Wound Care: You may wash cath site gently with soap and  water. Keep cath site clean and dry. If you notice pain, swelling, bleeding or pus at your cath site, please call [phone].   Increase activity slowly   Complete by:  As directed       Discharge Medications   Allergies as of 04/26/2018      Reactions   Sulfa Antibiotics Itching      Medication List    TAKE these medications   acetaminophen 325 MG tablet Commonly known as:  TYLENOL Take 650 mg by mouth every 6 (six) hours as needed for headache (pain). Reported on 03/27/2016   aspirin EC 81 MG tablet Take 81 mg by mouth at bedtime.   atorvastatin 40 MG tablet Commonly known as:  LIPITOR Take 1 tablet (40 mg total) by mouth daily at 6 PM.   furosemide 40 MG tablet Commonly known as:  LASIX Take 1 tablet (40 mg total) by mouth daily. Start taking on:  04/27/2018   isosorbide-hydrALAZINE 20-37.5 MG tablet Commonly known as:  BIDIL Take 1 tablet by mouth 2 (two) times daily.   metoprolol succinate 100 MG 24 hr tablet Commonly known as:  TOPROL-XL Take 1 tablet (100 mg total) by mouth at bedtime. Take with or immediately following a  meal.   multivitamin with minerals Tabs tablet Take 0.5 tablets by mouth at bedtime.   nitroGLYCERIN 0.4 MG SL tablet Commonly known as:  NITROSTAT Place 1 tablet (0.4 mg total) under the tongue every 5 (five) minutes x 3 doses as needed for chest pain.   QUEtiapine 25 MG tablet Commonly known as:  SEROQUEL Take 1 tablet (25 mg total) by mouth at bedtime.   VITAMIN D3 PO Take 1 capsule by mouth at bedtime.        Acute coronary syndrome (MI, NSTEMI, STEMI, etc) this admission?:  No.  The elevated Troponin was due to the acute medical illness or demand ischemia.    Outstanding Labs/Studies   BMet  Duration of Discharge Encounter   Greater than 30 minutes including physician time.  Signed, Luna Fuse, NP 04/26/2018, 11:48 AM  Agree with above  W. Doristine Church MD [facility]

## 2018-04-28 ENCOUNTER — Telehealth: Payer: Self-pay | Admitting: *Deleted

## 2018-04-28 DIAGNOSIS — N184 Chronic kidney disease, stage 4 (severe): Secondary | ICD-10-CM

## 2018-04-28 DIAGNOSIS — I5041 Acute combined systolic (congestive) and diastolic (congestive) heart failure: Secondary | ICD-10-CM

## 2018-04-28 NOTE — Telephone Encounter (Signed)
-----   Message from Belva Crome, MD sent at 04/27/2018  3:40 PM EDT ----- Regarding: FW: Needs renal followup this week Anderson [place], We need to get Mrs Houchin to have a BMET on Monday or Tuesday.  HS ----- Message ----- From: Jacolyn Reedy, MD Sent: 04/27/2018   2:46 PM To: Belva Crome, MD, Daune Perch, NP Subject: Needs renal followup this week                 Please see that she gets a followup BMP early this week to check her creatinine following cath

## 2018-04-28 NOTE — Telephone Encounter (Signed)
Spoke with pt and son.  They are agreeable for pt to come by tomorrow for lab work.

## 2018-04-29 ENCOUNTER — Other Ambulatory Visit: Payer: Medicare Other

## 2018-04-29 ENCOUNTER — Ambulatory Visit (HOSPITAL_COMMUNITY)
Admission: RE | Admit: 2018-04-29 | Payer: Medicare Other | Source: Ambulatory Visit | Admitting: Interventional Cardiology

## 2018-04-29 DIAGNOSIS — I5041 Acute combined systolic (congestive) and diastolic (congestive) heart failure: Secondary | ICD-10-CM

## 2018-04-29 DIAGNOSIS — N184 Chronic kidney disease, stage 4 (severe): Secondary | ICD-10-CM | POA: Diagnosis not present

## 2018-04-29 DIAGNOSIS — I5020 Unspecified systolic (congestive) heart failure: Secondary | ICD-10-CM | POA: Diagnosis not present

## 2018-04-29 DIAGNOSIS — I2510 Atherosclerotic heart disease of native coronary artery without angina pectoris: Secondary | ICD-10-CM | POA: Diagnosis not present

## 2018-04-29 DIAGNOSIS — F0150 Vascular dementia without behavioral disturbance: Secondary | ICD-10-CM | POA: Diagnosis not present

## 2018-04-30 DIAGNOSIS — I214 Non-ST elevation (NSTEMI) myocardial infarction: Secondary | ICD-10-CM | POA: Diagnosis not present

## 2018-04-30 DIAGNOSIS — I130 Hypertensive heart and chronic kidney disease with heart failure and stage 1 through stage 4 chronic kidney disease, or unspecified chronic kidney disease: Secondary | ICD-10-CM | POA: Diagnosis not present

## 2018-04-30 DIAGNOSIS — N183 Chronic kidney disease, stage 3 (moderate): Secondary | ICD-10-CM | POA: Diagnosis not present

## 2018-04-30 DIAGNOSIS — J439 Emphysema, unspecified: Secondary | ICD-10-CM | POA: Diagnosis not present

## 2018-04-30 DIAGNOSIS — I2510 Atherosclerotic heart disease of native coronary artery without angina pectoris: Secondary | ICD-10-CM | POA: Diagnosis not present

## 2018-04-30 DIAGNOSIS — I5043 Acute on chronic combined systolic (congestive) and diastolic (congestive) heart failure: Secondary | ICD-10-CM | POA: Diagnosis not present

## 2018-04-30 LAB — BASIC METABOLIC PANEL
BUN / CREAT RATIO: 12 (ref 12–28)
BUN: 35 mg/dL — ABNORMAL HIGH (ref 8–27)
CHLORIDE: 107 mmol/L — AB (ref 96–106)
CO2: 15 mmol/L — ABNORMAL LOW (ref 20–29)
Calcium: 10.2 mg/dL (ref 8.7–10.3)
Creatinine, Ser: 2.81 mg/dL — ABNORMAL HIGH (ref 0.57–1.00)
GFR, EST AFRICAN AMERICAN: 19 mL/min/{1.73_m2} — AB (ref >59)
GFR, EST NON AFRICAN AMERICAN: 16 mL/min/{1.73_m2} — AB (ref >59)
Glucose: 92 mg/dL (ref 65–99)
POTASSIUM: 4.5 mmol/L (ref 3.5–5.2)
SODIUM: 144 mmol/L (ref 134–144)

## 2018-05-01 ENCOUNTER — Ambulatory Visit: Payer: Medicare Other | Admitting: Cardiology

## 2018-05-05 DIAGNOSIS — N183 Chronic kidney disease, stage 3 (moderate): Secondary | ICD-10-CM | POA: Diagnosis not present

## 2018-05-05 DIAGNOSIS — I2510 Atherosclerotic heart disease of native coronary artery without angina pectoris: Secondary | ICD-10-CM | POA: Diagnosis not present

## 2018-05-05 DIAGNOSIS — I5043 Acute on chronic combined systolic (congestive) and diastolic (congestive) heart failure: Secondary | ICD-10-CM | POA: Diagnosis not present

## 2018-05-05 DIAGNOSIS — I130 Hypertensive heart and chronic kidney disease with heart failure and stage 1 through stage 4 chronic kidney disease, or unspecified chronic kidney disease: Secondary | ICD-10-CM | POA: Diagnosis not present

## 2018-05-05 DIAGNOSIS — I214 Non-ST elevation (NSTEMI) myocardial infarction: Secondary | ICD-10-CM | POA: Diagnosis not present

## 2018-05-05 DIAGNOSIS — J439 Emphysema, unspecified: Secondary | ICD-10-CM | POA: Diagnosis not present

## 2018-05-06 DIAGNOSIS — I5020 Unspecified systolic (congestive) heart failure: Secondary | ICD-10-CM | POA: Diagnosis not present

## 2018-05-06 DIAGNOSIS — Z0001 Encounter for general adult medical examination with abnormal findings: Secondary | ICD-10-CM | POA: Diagnosis not present

## 2018-05-06 DIAGNOSIS — E559 Vitamin D deficiency, unspecified: Secondary | ICD-10-CM | POA: Diagnosis not present

## 2018-05-06 DIAGNOSIS — F0390 Unspecified dementia without behavioral disturbance: Secondary | ICD-10-CM | POA: Diagnosis not present

## 2018-05-06 DIAGNOSIS — N184 Chronic kidney disease, stage 4 (severe): Secondary | ICD-10-CM | POA: Diagnosis not present

## 2018-05-06 DIAGNOSIS — I2510 Atherosclerotic heart disease of native coronary artery without angina pectoris: Secondary | ICD-10-CM | POA: Diagnosis not present

## 2018-05-06 DIAGNOSIS — K219 Gastro-esophageal reflux disease without esophagitis: Secondary | ICD-10-CM | POA: Diagnosis not present

## 2018-05-06 DIAGNOSIS — J309 Allergic rhinitis, unspecified: Secondary | ICD-10-CM | POA: Diagnosis not present

## 2018-05-09 DIAGNOSIS — N183 Chronic kidney disease, stage 3 (moderate): Secondary | ICD-10-CM | POA: Diagnosis not present

## 2018-05-09 DIAGNOSIS — I214 Non-ST elevation (NSTEMI) myocardial infarction: Secondary | ICD-10-CM | POA: Diagnosis not present

## 2018-05-09 DIAGNOSIS — I5043 Acute on chronic combined systolic (congestive) and diastolic (congestive) heart failure: Secondary | ICD-10-CM | POA: Diagnosis not present

## 2018-05-09 DIAGNOSIS — J439 Emphysema, unspecified: Secondary | ICD-10-CM | POA: Diagnosis not present

## 2018-05-09 DIAGNOSIS — I2510 Atherosclerotic heart disease of native coronary artery without angina pectoris: Secondary | ICD-10-CM | POA: Diagnosis not present

## 2018-05-09 DIAGNOSIS — I130 Hypertensive heart and chronic kidney disease with heart failure and stage 1 through stage 4 chronic kidney disease, or unspecified chronic kidney disease: Secondary | ICD-10-CM | POA: Diagnosis not present

## 2018-05-13 ENCOUNTER — Encounter: Payer: Self-pay | Admitting: Cardiology

## 2018-05-13 DIAGNOSIS — N2581 Secondary hyperparathyroidism of renal origin: Secondary | ICD-10-CM | POA: Diagnosis not present

## 2018-05-13 DIAGNOSIS — I129 Hypertensive chronic kidney disease with stage 1 through stage 4 chronic kidney disease, or unspecified chronic kidney disease: Secondary | ICD-10-CM | POA: Diagnosis not present

## 2018-05-13 DIAGNOSIS — N184 Chronic kidney disease, stage 4 (severe): Secondary | ICD-10-CM | POA: Diagnosis not present

## 2018-05-13 DIAGNOSIS — I428 Other cardiomyopathies: Secondary | ICD-10-CM | POA: Diagnosis not present

## 2018-05-14 DIAGNOSIS — N183 Chronic kidney disease, stage 3 (moderate): Secondary | ICD-10-CM | POA: Diagnosis not present

## 2018-05-14 DIAGNOSIS — I130 Hypertensive heart and chronic kidney disease with heart failure and stage 1 through stage 4 chronic kidney disease, or unspecified chronic kidney disease: Secondary | ICD-10-CM | POA: Diagnosis not present

## 2018-05-14 DIAGNOSIS — I5043 Acute on chronic combined systolic (congestive) and diastolic (congestive) heart failure: Secondary | ICD-10-CM | POA: Diagnosis not present

## 2018-05-14 DIAGNOSIS — I2510 Atherosclerotic heart disease of native coronary artery without angina pectoris: Secondary | ICD-10-CM | POA: Diagnosis not present

## 2018-05-14 DIAGNOSIS — I214 Non-ST elevation (NSTEMI) myocardial infarction: Secondary | ICD-10-CM | POA: Diagnosis not present

## 2018-05-14 DIAGNOSIS — J439 Emphysema, unspecified: Secondary | ICD-10-CM | POA: Diagnosis not present

## 2018-05-14 NOTE — Progress Notes (Addendum)
Cardiology Office Note   Date:  05/15/2018   ID:  Betty [name], DOB 15-Sep-1946, MRN [id number]  PCP:  Josetta Huddle, MD  Cardiologist:  Dr. Tamala Julian    Chief Complaint  Patient presents with  . Hospitalization Follow-up    heart failure      History of Present Illness: Betty [name] is a 72 y.o. female who presents for post hospital  She has a hx of CKD stage III, essential HTN, stage Ib lung cancer (s/pchemotherapy and surgical intervention), HLD, COPD, dementiaand recent dx of severe chronic systolic heart failure with EF of 15% per echocardiogram by PCP. She had a recent hospitalization for acute CHF and NSTEMI with aborted cath (04/01/18) secondary to acute confusion and agitation however with presumed CAD. Came to ED with one episode of chest pain shortness of breath,little hypoxic on presentation requiring 2 L of oxygen.  She was seen by Dr. Angelena Form who referred her for right and left heart cath to define her anatomy and physiology.  Should be it should be known that her kidney function was CK D3.  She has a known EF of 15%.  By prior 2D echocardiogram.  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  90% RCA Continue medical therapy for nonischemic cardia myopathy will be recommended.  The sheaths were removed and pressure held on the groin to achieve hemostasis.  The patient left the lab in stable condition  + dementia.  She continued to be confused prior to discharge.  MRI of the brain showed volume loss and occluded cerebral artery.    Today she denies SOB except at times but her son who is staying with her has noted she gets up and goes to room with a heater and she is SOB and has to sit up to sleep.  No chest pain and no edema.  She saw Dr. Florene Glen earlier this week and labs were drawn.  Will call for results.  She does have appt with Dr. Caryl Comes on the 16th for possible resync therapy.     Her wt is stable.  She is weighing daily.  Her son is in charge of meals so very little salt and they walk every afternoon.  He prepares her meds as well.   She talks about how he has her do things, this is due to her dementia.   Past Medical History:  Diagnosis Date  . Acute combined systolic and diastolic (congestive) hrt fail ([place])   . Acute mastitis of right breast    10/2003  . Arthritis    bursitis in right shoulder  . Chronic kidney disease    CKD stage 3 04/28/15  . CKD (chronic kidney disease), stage IV ([place])   . 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 Colon polyps   . 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 Family history of breast cancer   . GERD (gastroesophageal reflux disease)   . Hypercholesterolemia   . Hypertension   . Non-small cell carcinoma of right lung, stage 1 ([place]) 05/17/2015   Stage IB, right upper lobectomy 04/29/15   . NSCL ca dx'd 03/2015   chemo; RL Lobectomy  . NSTEMI (non-ST elevated myocardial infarction) ([place]) 03/24/2018  . Pure hypercholesterolemia   . S/P lobectomy of lung 04/29/2015  . Sciatica of right side   . Seasonal allergic rhinitis   . Tobacco dependence   .  Tubular adenoma of colon   . Vitamin D deficiency     Past Surgical History:  Procedure Laterality Date  . BREAST SURGERY     small mass removed from left breast--benign  . CRYO INTERCOSTAL NERVE BLOCK Right 04/29/2015   Procedure: CRYO INTERCOSTAL NERVE BLOCK;  Surgeon: [name], MD;  Location: [facility];  Service: Thoracic;  Laterality: Right;  . LOBECTOMY Right 04/29/2015   Procedure: RIGHT LOWER LUNG LOBECTOMY ;  Surgeon: [name], MD;  Location: [facility];  Service: Thoracic;  Laterality: Right;  . LYMPH NODE DISSECTION Right 04/29/2015   Procedure: RIGHT LUNG LYMPH NODE DISSECTION;  Surgeon: [name], MD;  Location: [facility];  Service: Thoracic;  Laterality: Right;  . RIGHT/LEFT HEART CATH AND CORONARY  ANGIOGRAPHY N/A 04/24/2018   Procedure: RIGHT/LEFT HEART CATH AND CORONARY ANGIOGRAPHY;  Surgeon: [name], MD;  Location: [facility];  Service: Cardiovascular;  Laterality: N/A;  . VAGINAL DELIVERY     51 yrs ago  . VIDEO ASSISTED THORACOSCOPY (VATS)/ LOBECTOMY Right 04/29/2015   Procedure: RIGHT VIDEO ASSISTED THORACOSCOPY (VATS) WEDGE RESECTION/ RIGHT LOWER LOBECTOMY, CRYO-ANALGESIA OF INTERCOSTAL NERVES;  Surgeon: [name], MD;  Location: [facility];  Service: Thoracic;  Laterality: Right;     Current Outpatient Medications  Medication Sig Dispense Refill  . acetaminophen (TYLENOL) 325 MG tablet Take 650 mg by mouth every 6 (six) hours as needed for headache (pain). Reported on 03/27/2016    . aspirin EC 81 MG tablet Take 81 mg by mouth at bedtime.    Marland Kitchen atorvastatin (LIPITOR) 40 MG tablet Take 1 tablet (40 mg total) by mouth daily at 6 PM. 90 tablet 3  . Cholecalciferol (VITAMIN D3 PO) Take 1 capsule by mouth at bedtime.    . furosemide (LASIX) 40 MG tablet Take 1 tablet (40 mg total) by mouth daily. 90 tablet 3  . isosorbide-hydrALAZINE (BIDIL) 20-37.5 MG tablet Take 1 tablet by mouth 2 (two) times daily. 180 tablet 3  . metoprolol succinate (TOPROL-XL) 100 MG 24 hr tablet Take 1 tablet (100 mg total) by mouth at bedtime. Take with or immediately following a meal. 90 tablet 3  . Multiple Vitamin (MULTIVITAMIN WITH MINERALS) TABS tablet Take 0.5 tablets by mouth at bedtime.    . nitroGLYCERIN (NITROSTAT) 0.4 MG SL tablet Place 1 tablet (0.4 mg total) under the tongue every 5 (five) minutes x 3 doses as needed for chest pain. 25 tablet 12  . QUEtiapine (SEROQUEL) 25 MG tablet Take 1 tablet (25 mg total) by mouth at bedtime. 90 tablet 3   No current facility-administered medications for this visit.     Allergies:   Sulfa antibiotics    Social History:  The patient  reports that she quit smoking about 3 years ago. Her smoking use included cigarettes. She has a 35.25  pack-year smoking history. She has never used smokeless tobacco. She reports that she drinks alcohol. She reports that she does not use drugs.   Family History:  The patient's family history includes Cancer in her father and sister; Heart attack in her brother; Other in her son.    ROS:  General:no colds or fevers, no weight changes Skin:no rashes or ulcers HEENT:no blurred vision, no congestion CV:see HPI PUL:see HPI GI:no diarrhea constipation or melena, no indigestion GU:no hematuria, no dysuria MS:no joint pain, no claudication Neuro:no syncope, no lightheadedness, + dementia  Endo:no diabetes, no thyroid disease  Wt Readings from Last 3 Encounters:  05/15/18 131 lb (59.4 kg)  04/25/18 130 lb (59 kg)  04/09/18 135 lb 9.6 oz (61.5 kg)     PHYSICAL EXAM: VS:  BP 124/72   Pulse 89   Ht 5\' 4"  (1.626 m)   Wt 131 lb (59.4 kg)   SpO2 97%   BMI 22.49 kg/m  , BMI Body mass index is 22.49 kg/m. General:Pleasant affect, NAD Skin:Warm and dry, brisk capillary refill HEENT:normocephalic, sclera clear, mucus membranes moist Neck:supple, no JVD, no bruits  Heart:S1S2 RRR without murmur, gallup, rub or click Lungs:clear without rales, rhonchi, or wheezes XWR:UEAV, non tender, + BS, do not palpate liver spleen or masses Ext:no lower ext edema, 2+ pedal pulses, 2+ radial pulses Neuro:alert and oriented X 3 but does repeat information at times, MAE, follows commands, + facial symmetry    EKG:  EKG is NOT ordered today.   Recent Labs: 02/04/2018: ALT 15 04/22/2018: B Natriuretic Peptide 3,923.9 04/26/2018: Hemoglobin 10.9; Platelets 284 04/29/2018: BUN 35; Creatinine, Ser 2.81; Potassium 4.5; Sodium 144    Lipid Panel    Component Value Date/Time   CHOL 175 03/25/2018 0216   TRIG 103 03/25/2018 0216   HDL 39 (L) 03/25/2018 0216   CHOLHDL 4.5 03/25/2018 0216   VLDL 21 03/25/2018 0216   LDLCALC 115 (H) 03/25/2018 0216       Other studies Reviewed: Additional studies/  records that were reviewed today include: . Echo 03/03/18 Study Conclusions  - Left ventricle: LVEF is severely depressed at approximately 15%   with diffuse hypokinesis; inferior akiensis. The cavity size was   normal. Wall thickness was increased in a pattern of mild LVH. - Mitral valve: There was mild regurgitation. - Pericardium, extracardiac: A trivial pericardial effusion was   identified.  Cardiac cath 04/24/18  IMPRESSION: Ms. Randa has a dilated nonischemic cardia myopathy with elevated filling pressures, high wedge pressures as well as low cardiac output.  Continue medical therapy for nonischemic cardia myopathy will be recommended.  The sheaths were removed and pressure held on the groin to achieve hemostasis.  The patient left the lab in stable condition.  Prox RCA lesion is 90% stenosed.   Right Heart Pressures Right atrial pressure- 15/16 Right ventricular pressure-49/9 Pulmonary artery pressure-63/13, mean 33 Pulmonary wedge pressure- A-wave equals 47, V wave equals 44, mean equals 31 LVEDP was 29 Cardiac index by Fick was 2.1 L/min/m, by thermodilution 1.2 L/min/m.     ASSESSMENT AND PLAN:  1.  NICM, do not believe the 90% RCA is cause of EF of 15% -pt on BiDIl and BB toprol.  BP is controlled.  Her son and I discussed Entresto or ACE/ARB but her Cr is  2.34  For same reason no aldactone.   She is to see Dr. Caryl Comes next week for possible re scync therapy.  She has LBBB -- follow up with Dr. Tamala Julian in 4-6 weeks, Dr. Tamala Julian is also cardiologist for her son.   2.  CAD on statin.   3.  CKD IV has seen Dr. Florene Glen and he is following her renal function.  4.  SOB this is mostly at night, will check pro BNP today most likely will increase lasix to 40 mg alternating with 60 mg every other day.  Will check Cr first.   5.  Dementia pt is in her home and son is staying there- difficult situation.      LABS done at Renal office 05/13/18 these came after visit.   Cr 2.96, BUN 33,  K+ 4.2, C02 17  HGB 11.5, HCT 33.7    Current medicines are reviewed with the patient today.  The patient Has no concerns regarding medicines.  The following changes have been made:  See above Labs/ tests ordered today include:see above  Disposition:   FU:  see above  Signed, [name], NP  05/15/2018 11:32 AM    [address], [address] [place], [place] Phone: [phone]; Fax: [phone]

## 2018-05-15 ENCOUNTER — Encounter: Payer: Self-pay | Admitting: Cardiology

## 2018-05-15 ENCOUNTER — Other Ambulatory Visit: Payer: Self-pay

## 2018-05-15 ENCOUNTER — Encounter (INDEPENDENT_AMBULATORY_CARE_PROVIDER_SITE_OTHER): Payer: Self-pay

## 2018-05-15 ENCOUNTER — Ambulatory Visit (INDEPENDENT_AMBULATORY_CARE_PROVIDER_SITE_OTHER): Payer: Medicare Other | Admitting: Cardiology

## 2018-05-15 VITALS — BP 124/72 | HR 89 | Ht 64.0 in | Wt 131.0 lb

## 2018-05-15 DIAGNOSIS — I5041 Acute combined systolic (congestive) and diastolic (congestive) heart failure: Secondary | ICD-10-CM | POA: Diagnosis not present

## 2018-05-15 DIAGNOSIS — F0789 Other personality and behavioral disorders due to known physiological condition: Secondary | ICD-10-CM | POA: Diagnosis not present

## 2018-05-15 DIAGNOSIS — N184 Chronic kidney disease, stage 4 (severe): Secondary | ICD-10-CM

## 2018-05-15 DIAGNOSIS — R0602 Shortness of breath: Secondary | ICD-10-CM

## 2018-05-15 DIAGNOSIS — I447 Left bundle-branch block, unspecified: Secondary | ICD-10-CM

## 2018-05-15 DIAGNOSIS — I2510 Atherosclerotic heart disease of native coronary artery without angina pectoris: Secondary | ICD-10-CM

## 2018-05-15 DIAGNOSIS — F09 Unspecified mental disorder due to known physiological condition: Secondary | ICD-10-CM

## 2018-05-15 LAB — PRO B NATRIURETIC PEPTIDE: NT-Pro BNP: 26811 pg/mL — ABNORMAL HIGH (ref 0–301)

## 2018-05-15 MED ORDER — FUROSEMIDE 40 MG PO TABS: ORAL_TABLET | ORAL | 1 refills | Status: DC

## 2018-05-15 NOTE — Progress Notes (Signed)
Discussed medication change with the Chrissie Noa her son, per dpr. The son expressed understanding and did not have further questions. Scheduled a BMP 1wk check for the medication change to confirm effectiveness. The appointment is July, 16 after her other [facility] appointment. Informed about the high Pro bnp as well. ------  Notes recorded by Isaiah Serge, NP on 05/15/2018 at 4:02 PM EDT Increase lasix to 60 mg alternating with 40 mg every other day. BMP in 1 week.

## 2018-05-15 NOTE — Patient Instructions (Addendum)
Your physician recommends that you continue on your current medications as directed. Please refer to the Current Medication list given to you today.  Your physician recommends that you return for lab work in:  [facility] recommends that you schedule a follow-up appointment in: [place]

## 2018-05-16 ENCOUNTER — Ambulatory Visit (INDEPENDENT_AMBULATORY_CARE_PROVIDER_SITE_OTHER): Payer: Medicare Other | Admitting: Internal Medicine

## 2018-05-16 ENCOUNTER — Encounter: Payer: Self-pay | Admitting: Internal Medicine

## 2018-05-16 VITALS — BP 100/60 | HR 91 | Ht 63.5 in | Wt 131.8 lb

## 2018-05-16 DIAGNOSIS — I2510 Atherosclerotic heart disease of native coronary artery without angina pectoris: Secondary | ICD-10-CM | POA: Diagnosis not present

## 2018-05-16 DIAGNOSIS — J439 Emphysema, unspecified: Secondary | ICD-10-CM | POA: Diagnosis not present

## 2018-05-16 DIAGNOSIS — I5043 Acute on chronic combined systolic (congestive) and diastolic (congestive) heart failure: Secondary | ICD-10-CM | POA: Diagnosis not present

## 2018-05-16 DIAGNOSIS — N183 Chronic kidney disease, stage 3 (moderate): Secondary | ICD-10-CM | POA: Diagnosis not present

## 2018-05-16 DIAGNOSIS — I428 Other cardiomyopathies: Secondary | ICD-10-CM

## 2018-05-16 DIAGNOSIS — I214 Non-ST elevation (NSTEMI) myocardial infarction: Secondary | ICD-10-CM | POA: Diagnosis not present

## 2018-05-16 DIAGNOSIS — N289 Disorder of kidney and ureter, unspecified: Secondary | ICD-10-CM

## 2018-05-16 DIAGNOSIS — I130 Hypertensive heart and chronic kidney disease with heart failure and stage 1 through stage 4 chronic kidney disease, or unspecified chronic kidney disease: Secondary | ICD-10-CM | POA: Diagnosis not present

## 2018-05-16 NOTE — Progress Notes (Signed)
ELECTROPHYSIOLOGY CONSULT NOTE  Patient ID: Betty [name], MRN: [id number], DOB/AGE: 1946/05/01 72 y.o. Admit date: (Not on file) Date of Consult: 05/16/2018  Primary Physician: Josetta Huddle, MD Primary Cardiologist: [facility]     Betty [name] is a 72 y.o. female who is being seen today for the evaluation of CRT  at the request of [facility].    HPI Betty [name] is a 72 y.o. female referred for consideration of CRT.  She presented with congestive heart failure earlier 2019 with symptoms of dyspnea and edema.  Echocardiogram had demonstrated an ejection fraction of 15%.  She was hospitalized on 3 occasions May and June 2019.  Guideline directed medical therapy was initiated limited by renal insufficiency (beta-blockers and BiDil).  Evaluation is as outlined below With 1 vessel coronary disease and LV dysfunction way out of proportion.  She was found also to have left bundle branch block new from 2016.  She has a history of mild dementia.  Her hospitalization was complicated by confusion and combativeness which prompted evaluation resulting in MRI scanning of her brain.  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  DATE TEST EF   4/19 Echo   20 % Inf AK diffuse HK  5/19 Myoview 17% Without fixed defect  5/19 LHC  RCA 90% >>med Rx   Since discharge she continues to have some shortness of breath.  She is able to recount some dyspnea and some peripheral edema.  She was unable to articulate the previously identified cardiac abnormalities, although subsequently on my relating that she recognized them.  She denies palpitations.  She has had no syncope.  She has had chest pressure; her troponins were borderline elevated at 1 of her hospitalizations.  Her related medical history is complicated.  In addition to the dementia which is mild, she has renal insufficiency grade 4.  She has a status post right lower lobectomy for non-small cell lung cancer  2016.  This apparently is cured.      Past Medical History:  Diagnosis Date  . Acute combined systolic and diastolic (congestive) hrt fail ([place])   . Acute mastitis of right breast    10/2003  . Arthritis    bursitis in right shoulder  . Chronic kidney disease    CKD stage 3 04/28/15  . CKD (chronic kidney disease), stage IV ([place])   . 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 Colon polyps   . 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 Family history of breast cancer   . GERD (gastroesophageal reflux disease)   . Hypercholesterolemia   . Hypertension   . Non-small cell carcinoma of right lung, stage 1 ([place]) 05/17/2015   Stage IB, right upper lobectomy 04/29/15   . NSCL ca dx'd 03/2015   chemo; RL Lobectomy  . NSTEMI (non-ST elevated myocardial infarction) ([place]) 03/24/2018  . Pure hypercholesterolemia   . S/P lobectomy of lung 04/29/2015  . Sciatica of right side   . Seasonal allergic rhinitis   . Tobacco dependence   . Tubular adenoma of colon   . Vitamin D deficiency       Surgical History:  Past Surgical History:  Procedure Laterality Date  . BREAST SURGERY     small mass removed from left breast--benign  . CRYO INTERCOSTAL NERVE BLOCK Right 04/29/2015   Procedure: CRYO INTERCOSTAL NERVE BLOCK;  Surgeon: [name], MD;  Location: [place];  Service: Thoracic;  Laterality: Right;  . LOBECTOMY Right 04/29/2015   Procedure: RIGHT LOWER LUNG LOBECTOMY ;  Surgeon: [name], MD;  Location: [place];  Service: Thoracic;  Laterality: Right;  . LYMPH NODE DISSECTION Right 04/29/2015   Procedure: RIGHT LUNG LYMPH NODE DISSECTION;  Surgeon: [name], MD;  Location: [place];  Service: Thoracic;  Laterality: Right;  . RIGHT/LEFT HEART CATH AND CORONARY ANGIOGRAPHY N/A 04/24/2018   Procedure: RIGHT/LEFT HEART CATH AND CORONARY ANGIOGRAPHY;  Surgeon: [name], MD;  Location: [place] [facility];  Service: Cardiovascular;   Laterality: N/A;  . VAGINAL DELIVERY     51 yrs ago  . VIDEO ASSISTED THORACOSCOPY (VATS)/ LOBECTOMY Right 04/29/2015   Procedure: RIGHT VIDEO ASSISTED THORACOSCOPY (VATS) WEDGE RESECTION/ RIGHT LOWER LOBECTOMY, CRYO-ANALGESIA OF INTERCOSTAL NERVES;  Surgeon: [name], MD;  Location: [place];  Service: Thoracic;  Laterality: Right;     Home Meds: Prior to Admission medications   Medication Sig Start Date End Date Taking? Authorizing Provider  acetaminophen (TYLENOL) 325 MG tablet Take 650 mg by mouth every 6 (six) hours as needed for headache (pain). Reported on 03/27/2016   Yes [provider]  aspirin EC 81 MG tablet Take 81 mg by mouth at bedtime.   Yes [provider]  atorvastatin (LIPITOR) 40 MG tablet Take 1 tablet (40 mg total) by mouth daily at 6 PM. 04/02/18  Yes Kroeger, Lorelee Cover., PA-C  Cholecalciferol (VITAMIN D3 PO) Take 1 capsule by mouth at bedtime.   Yes [provider]  furosemide (LASIX) 40 MG tablet Take 40 mg by mouth daily.   Yes [provider]  isosorbide-hydrALAZINE (BIDIL) 20-37.5 MG tablet Take 1 tablet by mouth 2 (two) times daily. 04/02/18  Yes Kroeger, Daleen Snook M., PA-C  metoprolol succinate (TOPROL-XL) 100 MG 24 hr tablet Take 1 tablet (100 mg total) by mouth at bedtime. Take with or immediately following a meal. 04/02/18  Yes Kroeger, Lorelee Cover., PA-C  Multiple Vitamin (MULTIVITAMIN WITH MINERALS) TABS tablet Take 0.5 tablets by mouth at bedtime.   Yes [provider]  nitroGLYCERIN (NITROSTAT) 0.4 MG SL tablet Place 1 tablet (0.4 mg total) under the tongue every 5 (five) minutes x 3 doses as needed for chest pain. 04/26/18  Yes Daune Perch, NP  QUEtiapine (SEROQUEL) 25 MG tablet Take 1 tablet (25 mg total) by mouth at bedtime. 04/02/18  Yes Kroeger, Lorelee Cover., PA-C    Allergies:  Allergies  Allergen Reactions  . Sulfa Antibiotics Itching    Social History   Socioeconomic History  . Marital status: Single      Spouse name: Not on file  . Number of children: Not on file  . Years of education: Not on file  . Highest education level: Not on file  Occupational History  . Not on file  Social Needs  . Financial resource strain: Not hard at all  . Food insecurity:    Worry: Patient refused    Inability: Patient refused  . Transportation needs:    Medical: Patient refused    Non-medical: Patient refused  Tobacco Use  . Smoking status: Former Smoker    Packs/day: 0.75    Years: 47.00    Pack years: 35.25    Types: Cigarettes    Last attempt to quit: 04/29/2015    Years since quitting: 3.0  . Smokeless tobacco: Never Used  . Tobacco comment: no cigarettes since surgery  Substance and Sexual Activity  . Alcohol  use: Yes    Alcohol/week: 0.0 oz    Comment: rare for special occasions  . Drug use: No  . Sexual activity: Not Currently    Birth control/protection: None  Lifestyle  . Physical activity:    Days per week: Patient refused    Minutes per session: Patient refused  . Stress: Only a little  Relationships  . Social connections:    Talks on phone: Patient refused    Gets together: Patient refused    Attends religious service: Patient refused    Active member of club or organization: Patient refused    Attends meetings of clubs or organizations: Patient refused    Relationship status: Patient refused  . Intimate partner violence:    Fear of current or ex partner: Patient refused    Emotionally abused: Patient refused    Physically abused: Patient refused    Forced sexual activity: Patient refused  Other Topics Concern  . Not on file  Social History Narrative  . Not on file     Family History  Problem Relation Age of Onset  . Cancer Father   . Cancer Sister   . Heart attack Brother   . Other Son        bicuspid aortic valve     ROS:  Please see the history of present illness.     All other systems reviewed and negative.    Physical Exam: Blood pressure 100/60,  pulse 91, height 5' 3.5" (1.613 m), weight 131 lb 12 oz (59.8 kg). General: Well developed, well nourished female in no acute distress. Head: Normocephalic, atraumatic, sclera non-icteric, no xanthomas, nares are without discharge. EENT: normal  Lymph Nodes:  none Neck: Negative for carotid bruits. JVD 8-10 Back:without scoliosis kyphosis Lungs: Clear bilaterally to auscultation without wheezes, rales, or rhonchi. Breathing is unlabored. Heart: RRR with S1 S2.  2/6 systolic murmur . No rubs, or gallops appreciated. Abdomen: Soft, non-tender, non-distended with normoactive bowel sounds. No hepatomegaly. No rebound/guarding. No obvious abdominal masses. Msk:  Strength and tone appear normal for age. Extremities: No clubbing or cyanosis. tr edema.  Distal pedal pulses are 2+ and equal bilaterally. Skin: Warm and Dry Neuro: Alert and oriented X 3. CN III-XII intact Grossly normal sensory and motor function . Psych:  Responds to questions appropriately with a normal affect.      Labs: Cardiac Enzymes No results for input(s): CKTOTAL, CKMB, TROPONINI in the last 72 hours. CBC Lab Results  Component Value Date   WBC 7.7 04/26/2018   HGB 10.9 (L) 04/26/2018   HCT 33.2 (L) 04/26/2018   MCV 90.2 04/26/2018   PLT 284 04/26/2018   PROTIME: No results for input(s): LABPROT, INR in the last 72 hours. Chemistry No results for input(s): NA, K, CL, CO2, BUN, CREATININE, CALCIUM, PROT, BILITOT, ALKPHOS, ALT, AST, GLUCOSE in the last 168 hours.  Invalid input(s): LABALBU Lipids Lab Results  Component Value Date   CHOL 175 03/25/2018   HDL 39 (L) 03/25/2018   LDLCALC 115 (H) 03/25/2018   TRIG 103 03/25/2018   BNP NT-Pro BNP  Date/Time Value Ref Range Status  05/15/2018 10:04 AM 26,811 (H) 0 - 301 pg/mL Final    Comment:    The following cut-points have been suggested for the use of proBNP for the diagnostic evaluation of heart failure (HF) in patients with acute dyspnea: Modality                      Age  Optimal Cut                            (years)            Point ------------------------------------------------------ Diagnosis (rule in HF)        <50            450 pg/mL                           50 - 75            900 pg/mL                               >75           1800 pg/mL Exclusion (rule out HF)  Age independent     300 pg/mL    Thyroid Function Tests: No results for input(s): TSH, T4TOTAL, T3FREE, THYROIDAB in the last 72 hours.  Invalid input(s): FREET3 Miscellaneous No results found for: DDIMER  Radiology/Studies:  Mr Brain Wo Contrast  Result Date: 04/25/2018 CLINICAL DATA:  Dementia, suspected Alzheimer's disease. History of lung cancer, hypertension, hypercholesterolemia. EXAM: MRI HEAD WITHOUT CONTRAST TECHNIQUE: Multiplanar, multiecho pulse sequences of the brain and surrounding structures were obtained without intravenous contrast. COMPARISON:  None. FINDINGS: INTRACRANIAL CONTENTS: No reduced diffusion to suggest acute ischemia. No susceptibility artifact to suggest hemorrhage. Moderate global parenchymal brain volume loss. No hydrocephalus. Confluent T2 hyperintense signal bilateral basal ganglia and lesser extent thalami and prominent perivascular spaces, seen with chronic small vessel ischemic changes and chronic hypertension. No abnormal extra-axial fluid collections. No extra-axial masses. VASCULAR: Absent LEFT vertebral artery flow void. 8 mm RIGHT MCA bifurcation aneurysm. SKULL AND UPPER CERVICAL SPINE: No abnormal sellar expansion. No suspicious calvarial bone marrow signal. Craniocervical junction maintained. Degenerative cervical spine resulting in at least moderate canal stenosis C4-5. SINUSES/ORBITS: Small LEFT frontal mucosal retention cysts. The mastoid air-cells and included paranasal sinuses are otherwise well-aerated.The included ocular globes and orbital contents are non-suspicious. OTHER: None. IMPRESSION: 1. No acute  intracranial process. 2. 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 For constellation findings, recommend CTA HEAD and neck. 3. Moderate global parenchymal brain volume loss. 4. Moderate chronic small vessel ischemic changes. 5. At least moderate C4-5 canal stenosis . Electronically Signed   By: Elon Alas M.D.   On: 04/25/2018 18:15   Dg Chest Portable 1 View  Result Date: 04/22/2018 CLINICAL DATA:  Chest pain, shortness of breath, CHF. EXAM: PORTABLE CHEST 1 VIEW COMPARISON:  03/24/2018 FINDINGS: Cardiac enlargement. No vascular congestion. Blunting of the right costophrenic angle is unchanged since previous study. This may represent chronic pleural effusion or pleural thickening. Atelectasis in the right lung base. Left lung is clear and expanded. No pneumothorax. Mediastinal contours appear intact. Surgical clips in the right hilum. IMPRESSION: Cardiac enlargement. No evidence of active pulmonary disease. Blunting of the right costophrenic angle unchanged since prior study, suggesting chronic pleural effusion or pleural thickening. Electronically Signed   By: Lucienne Capers M.D.   On: 04/22/2018 06:45    EKG: Sinus rhythm at 91 Intervals 13/16/47   Assessment and Plan:  Nonischemic cardiomyopathy  Coronary artery disease-single-vessel (RCA)  Congestive heart failure-chronic-systolic-class IIb-III 80  Left bundle branch block greater than 150 ms  Renal insufficiency grade 4  Dementia-mild  Non-small cell lung cancer status post  lobectomy 2016 without known recurrence   The patient has symptomatic congestive heart failure despite guideline directed medical therapy.  It is reasonable to consider CRT for quality of life.  Its benefits may be attenuated by comorbidities and the implantation risks enhanced because of renal insufficiency.  Still however it would be reasonable to pursue.    I do not know that there is a role for ICD implantation.  Given her  comorbidities are significant and the [place] trial has raised questions as to the utility of the therapy in patients who are older.  Furthermore, much of the benefit is thought to be mitigated by resynchronization.  Hence, I recommended that we not pursue CRT-D but rather CRT-P.  She is here today with her niece.  I have mentioned that there are increased risks to implantation related to her mental issues.  We will check her creatinine today following her catheterization 3 weeks ago.  The benefits and risks were reviewed including but not limited to death,  perforation, infection, lead dislodgement and device malfunction.  The patient understands agrees and is willing to proceed.      Virl Axe

## 2018-05-16 NOTE — H&P (View-Only) (Signed)
ELECTROPHYSIOLOGY CONSULT NOTE  Patient ID: Betty [name], MRN: [id number], DOB/AGE: 1946/10/01 72 y.o. Admit date: (Not on file) Date of Consult: 05/16/2018  Primary Physician: Josetta Huddle, MD Primary Cardiologist: [facility]     Betty [name] is a 72 y.o. female who is being seen today for the evaluation of CRT  at the request of [facility].    HPI Betty [name] is a 72 y.o. female referred for consideration of CRT.  She presented with congestive heart failure earlier 2019 with symptoms of dyspnea and edema.  Echocardiogram had demonstrated an ejection fraction of 15%.  She was hospitalized on 3 occasions May and June 2019.  Guideline directed medical therapy was initiated limited by renal insufficiency (beta-blockers and BiDil).  Evaluation is as outlined below With 1 vessel coronary disease and LV dysfunction way out of proportion.  She was found also to have left bundle branch block new from 2016.  She has a history of mild dementia.  Her hospitalization was complicated by confusion and combativeness which prompted evaluation resulting in MRI scanning of her brain.  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  DATE TEST EF   4/19 Echo   20 % Inf AK diffuse HK  5/19 Myoview 17% Without fixed defect  5/19 LHC  RCA 90% >>med Rx   Since discharge she continues to have some shortness of breath.  She is able to recount some dyspnea and some peripheral edema.  She was unable to articulate the previously identified cardiac abnormalities, although subsequently on my relating that she recognized them.  She denies palpitations.  She has had no syncope.  She has had chest pressure; her troponins were borderline elevated at 1 of her hospitalizations.  Her related medical history is complicated.  In addition to the dementia which is mild, she has renal insufficiency grade 4.  She has a status post right lower lobectomy for non-small cell lung cancer  2016.  This apparently is cured.      Past Medical History:  Diagnosis Date  . Acute combined systolic and diastolic (congestive) hrt fail ([place])   . Acute mastitis of right breast    10/2003  . Arthritis    bursitis in right shoulder  . Chronic kidney disease    CKD stage 3 04/28/15  . CKD (chronic kidney disease), stage IV ([place])   . 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 Colon polyps   . 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 Family history of breast cancer   . GERD (gastroesophageal reflux disease)   . Hypercholesterolemia   . Hypertension   . Non-small cell carcinoma of right lung, stage 1 ([place]) 05/17/2015   Stage IB, right upper lobectomy 04/29/15   . NSCL ca dx'd 03/2015   chemo; RL Lobectomy  . NSTEMI (non-ST elevated myocardial infarction) ([place]) 03/24/2018  . Pure hypercholesterolemia   . S/P lobectomy of lung 04/29/2015  . Sciatica of right side   . Seasonal allergic rhinitis   . Tobacco dependence   . Tubular adenoma of colon   . Vitamin D deficiency       Surgical History:  Past Surgical History:  Procedure Laterality Date  . BREAST SURGERY     small mass removed from left breast--benign  . CRYO INTERCOSTAL NERVE BLOCK Right 04/29/2015   Procedure: CRYO INTERCOSTAL NERVE BLOCK;  Surgeon: [name], MD;  Location: [place];  Service: Thoracic;  Laterality: Right;  . LOBECTOMY Right 04/29/2015   Procedure: RIGHT LOWER LUNG LOBECTOMY ;  Surgeon: [name], MD;  Location: [place];  Service: Thoracic;  Laterality: Right;  . LYMPH NODE DISSECTION Right 04/29/2015   Procedure: RIGHT LUNG LYMPH NODE DISSECTION;  Surgeon: [name], MD;  Location: [place];  Service: Thoracic;  Laterality: Right;  . RIGHT/LEFT HEART CATH AND CORONARY ANGIOGRAPHY N/A 04/24/2018   Procedure: RIGHT/LEFT HEART CATH AND CORONARY ANGIOGRAPHY;  Surgeon: [name], MD;  Location: [place] [facility];  Service: Cardiovascular;   Laterality: N/A;  . VAGINAL DELIVERY     51 yrs ago  . VIDEO ASSISTED THORACOSCOPY (VATS)/ LOBECTOMY Right 04/29/2015   Procedure: RIGHT VIDEO ASSISTED THORACOSCOPY (VATS) WEDGE RESECTION/ RIGHT LOWER LOBECTOMY, CRYO-ANALGESIA OF INTERCOSTAL NERVES;  Surgeon: [name], MD;  Location: [place];  Service: Thoracic;  Laterality: Right;     Home Meds: Prior to Admission medications   Medication Sig Start Date End Date Taking? Authorizing Provider  acetaminophen (TYLENOL) 325 MG tablet Take 650 mg by mouth every 6 (six) hours as needed for headache (pain). Reported on 03/27/2016   Yes [provider]  aspirin EC 81 MG tablet Take 81 mg by mouth at bedtime.   Yes [provider]  atorvastatin (LIPITOR) 40 MG tablet Take 1 tablet (40 mg total) by mouth daily at 6 PM. 04/02/18  Yes Kroeger, Lorelee Cover., PA-C  Cholecalciferol (VITAMIN D3 PO) Take 1 capsule by mouth at bedtime.   Yes [provider]  furosemide (LASIX) 40 MG tablet Take 40 mg by mouth daily.   Yes [provider]  isosorbide-hydrALAZINE (BIDIL) 20-37.5 MG tablet Take 1 tablet by mouth 2 (two) times daily. 04/02/18  Yes Kroeger, Daleen Snook M., PA-C  metoprolol succinate (TOPROL-XL) 100 MG 24 hr tablet Take 1 tablet (100 mg total) by mouth at bedtime. Take with or immediately following a meal. 04/02/18  Yes Kroeger, Lorelee Cover., PA-C  Multiple Vitamin (MULTIVITAMIN WITH MINERALS) TABS tablet Take 0.5 tablets by mouth at bedtime.   Yes [provider]  nitroGLYCERIN (NITROSTAT) 0.4 MG SL tablet Place 1 tablet (0.4 mg total) under the tongue every 5 (five) minutes x 3 doses as needed for chest pain. 04/26/18  Yes Daune Perch, NP  QUEtiapine (SEROQUEL) 25 MG tablet Take 1 tablet (25 mg total) by mouth at bedtime. 04/02/18  Yes Kroeger, Lorelee Cover., PA-C    Allergies:  Allergies  Allergen Reactions  . Sulfa Antibiotics Itching    Social History   Socioeconomic History  . Marital status: Single      Spouse name: Not on file  . Number of children: Not on file  . Years of education: Not on file  . Highest education level: Not on file  Occupational History  . Not on file  Social Needs  . Financial resource strain: Not hard at all  . Food insecurity:    Worry: Patient refused    Inability: Patient refused  . Transportation needs:    Medical: Patient refused    Non-medical: Patient refused  Tobacco Use  . Smoking status: Former Smoker    Packs/day: 0.75    Years: 47.00    Pack years: 35.25    Types: Cigarettes    Last attempt to quit: 04/29/2015    Years since quitting: 3.0  . Smokeless tobacco: Never Used  . Tobacco comment: no cigarettes since surgery  Substance and Sexual Activity  . Alcohol  use: Yes    Alcohol/week: 0.0 oz    Comment: rare for special occasions  . Drug use: No  . Sexual activity: Not Currently    Birth control/protection: None  Lifestyle  . Physical activity:    Days per week: Patient refused    Minutes per session: Patient refused  . Stress: Only a little  Relationships  . Social connections:    Talks on phone: Patient refused    Gets together: Patient refused    Attends religious service: Patient refused    Active member of club or organization: Patient refused    Attends meetings of clubs or organizations: Patient refused    Relationship status: Patient refused  . Intimate partner violence:    Fear of current or ex partner: Patient refused    Emotionally abused: Patient refused    Physically abused: Patient refused    Forced sexual activity: Patient refused  Other Topics Concern  . Not on file  Social History Narrative  . Not on file     Family History  Problem Relation Age of Onset  . Cancer Father   . Cancer Sister   . Heart attack Brother   . Other Son        bicuspid aortic valve     ROS:  Please see the history of present illness.     All other systems reviewed and negative.    Physical Exam: Blood pressure 100/60,  pulse 91, height 5' 3.5" (1.613 m), weight 131 lb 12 oz (59.8 kg). General: Well developed, well nourished female in no acute distress. Head: Normocephalic, atraumatic, sclera non-icteric, no xanthomas, nares are without discharge. EENT: normal  Lymph Nodes:  none Neck: Negative for carotid bruits. JVD 8-10 Back:without scoliosis kyphosis Lungs: Clear bilaterally to auscultation without wheezes, rales, or rhonchi. Breathing is unlabored. Heart: RRR with S1 S2.  2/6 systolic murmur . No rubs, or gallops appreciated. Abdomen: Soft, non-tender, non-distended with normoactive bowel sounds. No hepatomegaly. No rebound/guarding. No obvious abdominal masses. Msk:  Strength and tone appear normal for age. Extremities: No clubbing or cyanosis. tr edema.  Distal pedal pulses are 2+ and equal bilaterally. Skin: Warm and Dry Neuro: Alert and oriented X 3. CN III-XII intact Grossly normal sensory and motor function . Psych:  Responds to questions appropriately with a normal affect.      Labs: Cardiac Enzymes No results for input(s): CKTOTAL, CKMB, TROPONINI in the last 72 hours. CBC Lab Results  Component Value Date   WBC 7.7 04/26/2018   HGB 10.9 (L) 04/26/2018   HCT 33.2 (L) 04/26/2018   MCV 90.2 04/26/2018   PLT 284 04/26/2018   PROTIME: No results for input(s): LABPROT, INR in the last 72 hours. Chemistry No results for input(s): NA, K, CL, CO2, BUN, CREATININE, CALCIUM, PROT, BILITOT, ALKPHOS, ALT, AST, GLUCOSE in the last 168 hours.  Invalid input(s): LABALBU Lipids Lab Results  Component Value Date   CHOL 175 03/25/2018   HDL 39 (L) 03/25/2018   LDLCALC 115 (H) 03/25/2018   TRIG 103 03/25/2018   BNP NT-Pro BNP  Date/Time Value Ref Range Status  05/15/2018 10:04 AM 26,811 (H) 0 - 301 pg/mL Final    Comment:    The following cut-points have been suggested for the use of proBNP for the diagnostic evaluation of heart failure (HF) in patients with acute dyspnea: Modality                      Age  Optimal Cut                            (years)            Point ------------------------------------------------------ Diagnosis (rule in HF)        <50            450 pg/mL                           50 - 75            900 pg/mL                               >75           1800 pg/mL Exclusion (rule out HF)  Age independent     300 pg/mL    Thyroid Function Tests: No results for input(s): TSH, T4TOTAL, T3FREE, THYROIDAB in the last 72 hours.  Invalid input(s): FREET3 Miscellaneous No results found for: DDIMER  Radiology/Studies:  Mr Brain Wo Contrast  Result Date: 04/25/2018 CLINICAL DATA:  Dementia, suspected Alzheimer's disease. History of lung cancer, hypertension, hypercholesterolemia. EXAM: MRI HEAD WITHOUT CONTRAST TECHNIQUE: Multiplanar, multiecho pulse sequences of the brain and surrounding structures were obtained without intravenous contrast. COMPARISON:  None. FINDINGS: INTRACRANIAL CONTENTS: No reduced diffusion to suggest acute ischemia. No susceptibility artifact to suggest hemorrhage. Moderate global parenchymal brain volume loss. No hydrocephalus. Confluent T2 hyperintense signal bilateral basal ganglia and lesser extent thalami and prominent perivascular spaces, seen with chronic small vessel ischemic changes and chronic hypertension. No abnormal extra-axial fluid collections. No extra-axial masses. VASCULAR: Absent LEFT vertebral artery flow void. 8 mm RIGHT MCA bifurcation aneurysm. SKULL AND UPPER CERVICAL SPINE: No abnormal sellar expansion. No suspicious calvarial bone marrow signal. Craniocervical junction maintained. Degenerative cervical spine resulting in at least moderate canal stenosis C4-5. SINUSES/ORBITS: Small LEFT frontal mucosal retention cysts. The mastoid air-cells and included paranasal sinuses are otherwise well-aerated.The included ocular globes and orbital contents are non-suspicious. OTHER: None. IMPRESSION: 1. No acute  intracranial process. 2. 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 For constellation findings, recommend CTA HEAD and neck. 3. Moderate global parenchymal brain volume loss. 4. Moderate chronic small vessel ischemic changes. 5. At least moderate C4-5 canal stenosis . Electronically Signed   By: Elon Alas M.D.   On: 04/25/2018 18:15   Dg Chest Portable 1 View  Result Date: 04/22/2018 CLINICAL DATA:  Chest pain, shortness of breath, CHF. EXAM: PORTABLE CHEST 1 VIEW COMPARISON:  03/24/2018 FINDINGS: Cardiac enlargement. No vascular congestion. Blunting of the right costophrenic angle is unchanged since previous study. This may represent chronic pleural effusion or pleural thickening. Atelectasis in the right lung base. Left lung is clear and expanded. No pneumothorax. Mediastinal contours appear intact. Surgical clips in the right hilum. IMPRESSION: Cardiac enlargement. No evidence of active pulmonary disease. Blunting of the right costophrenic angle unchanged since prior study, suggesting chronic pleural effusion or pleural thickening. Electronically Signed   By: Lucienne Capers M.D.   On: 04/22/2018 06:45    EKG: Sinus rhythm at 91 Intervals 13/16/47   Assessment and Plan:  Nonischemic cardiomyopathy  Coronary artery disease-single-vessel (RCA)  Congestive heart failure-chronic-systolic-class IIb-III 80  Left bundle branch block greater than 150 ms  Renal insufficiency grade 4  Dementia-mild  Non-small cell lung cancer status post  lobectomy 2016 without known recurrence   The patient has symptomatic congestive heart failure despite guideline directed medical therapy.  It is reasonable to consider CRT for quality of life.  Its benefits may be attenuated by comorbidities and the implantation risks enhanced because of renal insufficiency.  Still however it would be reasonable to pursue.    I do not know that there is a role for ICD implantation.  Given her  comorbidities are significant and the [place] trial has raised questions as to the utility of the therapy in patients who are older.  Furthermore, much of the benefit is thought to be mitigated by resynchronization.  Hence, I recommended that we not pursue CRT-D but rather CRT-P.  She is here today with her niece.  I have mentioned that there are increased risks to implantation related to her mental issues.  We will check her creatinine today following her catheterization 3 weeks ago.  The benefits and risks were reviewed including but not limited to death,  perforation, infection, lead dislodgement and device malfunction.  The patient understands agrees and is willing to proceed.      Virl Axe

## 2018-05-16 NOTE — Patient Instructions (Signed)
Medication Instructions: - Your physician recommends that you continue on your current medications as directed. Please refer to the Current Medication list given to you today.  Labwork: - Your physician recommends that you have lab work today: [employer]  Procedures/Testing: - Your physician has recommended that you have a pacemaker inserted. A pacemaker is a small device that is placed under the skin of your chest or abdomen to help control abnormal heart rhythms. This device uses electrical pulses to prompt the heart to beat at a normal rate. Pacemakers are used to treat heart rhythms that are too slow. Wire (leads) are attached to the pacemaker that goes into the chambers of you heart. This is done in the hospital and usually requires and overnight stay.   Please call the office when you decide which date you would like to have this done Nira Conn, [facility] office [phone] Rolanda Lundborg, [place] office [phone]  Available dates as of today: Wed- 7/17 Fri- 7/19 Mon- 7/22 Wed- 7/24 Mon- 8/12 Fri- 8/16 Wed- 8/21 Fri- 8/30  Follow-Up: - pending your implant date  Any Additional Special Instructions Will Be Listed Below (If Applicable).     If you need a refill on your cardiac medications before your next appointment, please call your pharmacy.

## 2018-05-17 LAB — BASIC METABOLIC PANEL
BUN / CREAT RATIO: 10 — AB (ref 12–28)
BUN: 33 mg/dL — AB (ref 8–27)
CALCIUM: 9.5 mg/dL (ref 8.7–10.3)
CHLORIDE: 100 mmol/L (ref 96–106)
CO2: 20 mmol/L (ref 20–29)
CREATININE: 3.18 mg/dL — AB (ref 0.57–1.00)
GFR calc Af Amer: 16 mL/min/{1.73_m2} — ABNORMAL LOW (ref >59)
GFR calc non Af Amer: 14 mL/min/{1.73_m2} — ABNORMAL LOW (ref >59)
GLUCOSE: 95 mg/dL (ref 65–99)
Potassium: 3.8 mmol/L (ref 3.5–5.2)
Sodium: 139 mmol/L (ref 134–144)

## 2018-05-19 ENCOUNTER — Telehealth: Payer: Self-pay | Admitting: Internal Medicine

## 2018-05-19 NOTE — Telephone Encounter (Signed)
Spoke with pt's son and made him aware that message has been sent to Dr. Olin Pia nurse and she will contact him to set pt up for Pacemaker placement.  Son states he was hoping to get this done for this Wednesday the 17th.  Advised we will have Dr. Olin Pia nurse reach out to him to schedule.

## 2018-05-19 NOTE — Telephone Encounter (Signed)
New message    Patient's son  Chrissie Noa is calling to schedule pacemaker insert for his mother as discussed in last office visit.

## 2018-05-19 NOTE — Telephone Encounter (Signed)
Pt's son is calling concerning previous message. Please call

## 2018-05-20 ENCOUNTER — Telehealth: Payer: Self-pay

## 2018-05-20 ENCOUNTER — Institutional Professional Consult (permissible substitution): Payer: Self-pay | Admitting: Internal Medicine

## 2018-05-20 ENCOUNTER — Other Ambulatory Visit: Payer: Medicare Other

## 2018-05-20 DIAGNOSIS — I5041 Acute combined systolic (congestive) and diastolic (congestive) heart failure: Secondary | ICD-10-CM

## 2018-05-20 DIAGNOSIS — R0602 Shortness of breath: Secondary | ICD-10-CM

## 2018-05-20 DIAGNOSIS — Z803 Family history of malignant neoplasm of breast: Secondary | ICD-10-CM | POA: Diagnosis not present

## 2018-05-20 DIAGNOSIS — Z1231 Encounter for screening mammogram for malignant neoplasm of breast: Secondary | ICD-10-CM | POA: Diagnosis not present

## 2018-05-20 DIAGNOSIS — I428 Other cardiomyopathies: Secondary | ICD-10-CM

## 2018-05-20 DIAGNOSIS — N184 Chronic kidney disease, stage 4 (severe): Secondary | ICD-10-CM

## 2018-05-20 LAB — BASIC METABOLIC PANEL
BUN/Creatinine Ratio: 10 — ABNORMAL LOW (ref 12–28)
BUN: 31 mg/dL — ABNORMAL HIGH (ref 8–27)
CO2: 20 mmol/L (ref 20–29)
Calcium: 9.9 mg/dL (ref 8.7–10.3)
Chloride: 101 mmol/L (ref 96–106)
Creatinine, Ser: 3.2 mg/dL — ABNORMAL HIGH (ref 0.57–1.00)
GFR, EST AFRICAN AMERICAN: 16 mL/min/{1.73_m2} — AB (ref >59)
GFR, EST NON AFRICAN AMERICAN: 14 mL/min/{1.73_m2} — AB (ref >59)
Glucose: 114 mg/dL — ABNORMAL HIGH (ref 65–99)
POTASSIUM: 3.6 mmol/L (ref 3.5–5.2)
SODIUM: 142 mmol/L (ref 134–144)

## 2018-05-20 NOTE — Telephone Encounter (Signed)
Called and made patient aware of lab results, recommendations to continue current meds, and the need for repeat labs in 2 weeks. Lab appointment made for 7/29 on the same day as her wound check. Patient verbalized understanding and thanked me for the call. Forwarded to nephro.

## 2018-05-20 NOTE — Progress Notes (Signed)
Pts son called and given pre procedure instructions. Pt's son understands instruction letter and [employer] soap will be left at the front desk for them to pick up today. Pt will also have labs drawn today. Pt's son verbalized instructions and had no additional questions.

## 2018-05-20 NOTE — Telephone Encounter (Signed)
Pt scheduled for her pacemaker on 7/17. See letter encounter for details.

## 2018-05-20 NOTE — Telephone Encounter (Signed)
-----   Message from Isaiah Serge, NP sent at 05/20/2018  4:48 PM EDT ----- Continue current meds and send copy to Dr. Prince Rome. With renal.  Recheck BMP in 2 weeks.

## 2018-05-21 ENCOUNTER — Encounter (HOSPITAL_COMMUNITY): Payer: Self-pay | Admitting: General Practice

## 2018-05-21 ENCOUNTER — Other Ambulatory Visit: Payer: Self-pay

## 2018-05-21 ENCOUNTER — Encounter (HOSPITAL_COMMUNITY)
Admission: RE | Disposition: A | Discharge status: Home / Self Care | Payer: Self-pay | Source: Ambulatory Visit | Attending: Internal Medicine

## 2018-05-21 ENCOUNTER — Ambulatory Visit (HOSPITAL_COMMUNITY)
Admission: RE | Admit: 2018-05-21 | Discharge: 2018-05-22 | Disposition: A | Discharge status: Home / Self Care | Payer: Medicare Other | Source: Ambulatory Visit | Attending: Internal Medicine | Admitting: Internal Medicine

## 2018-05-21 DIAGNOSIS — K219 Gastro-esophageal reflux disease without esophagitis: Secondary | ICD-10-CM | POA: Diagnosis not present

## 2018-05-21 DIAGNOSIS — E7800 Pure hypercholesterolemia, unspecified: Secondary | ICD-10-CM | POA: Diagnosis not present

## 2018-05-21 DIAGNOSIS — I2510 Atherosclerotic heart disease of native coronary artery without angina pectoris: Secondary | ICD-10-CM | POA: Insufficient documentation

## 2018-05-21 DIAGNOSIS — M1990 Unspecified osteoarthritis, unspecified site: Secondary | ICD-10-CM | POA: Insufficient documentation

## 2018-05-21 DIAGNOSIS — Z87891 Personal history of nicotine dependence: Secondary | ICD-10-CM | POA: Diagnosis not present

## 2018-05-21 DIAGNOSIS — N184 Chronic kidney disease, stage 4 (severe): Secondary | ICD-10-CM | POA: Insufficient documentation

## 2018-05-21 DIAGNOSIS — Z882 Allergy status to sulfonamides status: Secondary | ICD-10-CM | POA: Insufficient documentation

## 2018-05-21 DIAGNOSIS — E559 Vitamin D deficiency, unspecified: Secondary | ICD-10-CM | POA: Diagnosis not present

## 2018-05-21 DIAGNOSIS — Z902 Acquired absence of lung [part of]: Secondary | ICD-10-CM | POA: Diagnosis not present

## 2018-05-21 DIAGNOSIS — I447 Left bundle-branch block, unspecified: Secondary | ICD-10-CM | POA: Diagnosis not present

## 2018-05-21 DIAGNOSIS — I5043 Acute on chronic combined systolic (congestive) and diastolic (congestive) heart failure: Secondary | ICD-10-CM | POA: Insufficient documentation

## 2018-05-21 DIAGNOSIS — Z7982 Long term (current) use of aspirin: Secondary | ICD-10-CM | POA: Insufficient documentation

## 2018-05-21 DIAGNOSIS — J449 Chronic obstructive pulmonary disease, unspecified: Secondary | ICD-10-CM | POA: Diagnosis not present

## 2018-05-21 DIAGNOSIS — I130 Hypertensive heart and chronic kidney disease with heart failure and stage 1 through stage 4 chronic kidney disease, or unspecified chronic kidney disease: Secondary | ICD-10-CM | POA: Insufficient documentation

## 2018-05-21 DIAGNOSIS — F0390 Unspecified dementia without behavioral disturbance: Secondary | ICD-10-CM | POA: Insufficient documentation

## 2018-05-21 DIAGNOSIS — I252 Old myocardial infarction: Secondary | ICD-10-CM | POA: Insufficient documentation

## 2018-05-21 DIAGNOSIS — I428 Other cardiomyopathies: Secondary | ICD-10-CM | POA: Diagnosis not present

## 2018-05-21 DIAGNOSIS — Z959 Presence of cardiac and vascular implant and graft, unspecified: Secondary | ICD-10-CM

## 2018-05-21 DIAGNOSIS — Z950 Presence of cardiac pacemaker: Secondary | ICD-10-CM

## 2018-05-21 DIAGNOSIS — I509 Heart failure, unspecified: Secondary | ICD-10-CM | POA: Diagnosis not present

## 2018-05-21 DIAGNOSIS — I5022 Chronic systolic (congestive) heart failure: Secondary | ICD-10-CM | POA: Diagnosis present

## 2018-05-21 HISTORY — DX: Presence of cardiac pacemaker: Z95.0

## 2018-05-21 HISTORY — PX: BIV PACEMAKER INSERTION CRT-P: EP1199

## 2018-05-21 HISTORY — PX: INSERT / REPLACE / REMOVE PACEMAKER: SUR710

## 2018-05-21 LAB — SURGICAL PCR SCREEN
MRSA, PCR: NEGATIVE
STAPHYLOCOCCUS AUREUS: NEGATIVE

## 2018-05-21 SURGERY — BIV PACEMAKER INSERTION CRT-P

## 2018-05-21 MED ORDER — CEFAZOLIN SODIUM-DEXTROSE 2-4 GM/100ML-% IV SOLN
INTRAVENOUS | Status: AC
  Filled 2018-05-21: qty 100

## 2018-05-21 MED ORDER — ATORVASTATIN CALCIUM 40 MG PO TABS
40.0000 mg | ORAL_TABLET | Freq: Every day | ORAL | Status: DC
  Administered 2018-05-21: 40 mg via ORAL
  Filled 2018-05-21: qty 1

## 2018-05-21 MED ORDER — YOU HAVE A PACEMAKER BOOK
Freq: Once | Status: AC
  Administered 2018-05-21: 21:00:00 1
  Filled 2018-05-21: qty 1

## 2018-05-21 MED ORDER — SODIUM CHLORIDE 0.9 % IV SOLN
INTRAVENOUS | Status: DC
  Administered 2018-05-21: 12:00:00 via INTRAVENOUS

## 2018-05-21 MED ORDER — FENTANYL CITRATE (PF) 100 MCG/2ML IJ SOLN
INTRAMUSCULAR | Status: DC | PRN
  Administered 2018-05-21 (×2): 12.5 ug via INTRAVENOUS

## 2018-05-21 MED ORDER — CEFAZOLIN SODIUM-DEXTROSE 2-4 GM/100ML-% IV SOLN
2.0000 g | INTRAVENOUS | Status: AC
  Administered 2018-05-21: 2 g via INTRAVENOUS

## 2018-05-21 MED ORDER — LIDOCAINE HCL (PF) 1 % IJ SOLN
INTRAMUSCULAR | Status: DC | PRN
  Administered 2018-05-21: 45 mL

## 2018-05-21 MED ORDER — SODIUM CHLORIDE 0.9 % IV SOLN
80.0000 mg | INTRAVENOUS | Status: AC
  Administered 2018-05-21: 80 mg

## 2018-05-21 MED ORDER — SODIUM CHLORIDE 0.9 % IV SOLN
INTRAVENOUS | Status: AC
  Filled 2018-05-21: qty 2

## 2018-05-21 MED ORDER — ACETAMINOPHEN 325 MG PO TABS: 650.0000 mg | ORAL_TABLET | Freq: Four times a day (QID) | ORAL | Status: DC | PRN

## 2018-05-21 MED ORDER — ACETAMINOPHEN 325 MG PO TABS: 325.0000 mg | ORAL_TABLET | ORAL | Status: DC | PRN

## 2018-05-21 MED ORDER — MIDAZOLAM HCL 5 MG/5ML IJ SOLN
INTRAMUSCULAR | Status: DC | PRN
  Administered 2018-05-21 (×2): 1 mg via INTRAVENOUS

## 2018-05-21 MED ORDER — IOPAMIDOL (ISOVUE-370) INJECTION 76%
INTRAVENOUS | Status: AC
  Filled 2018-05-21: qty 50

## 2018-05-21 MED ORDER — SODIUM CHLORIDE 0.9 % IV SOLN
INTRAVENOUS | Status: AC
  Administered 2018-05-21: 15:00:00 via INTRAVENOUS

## 2018-05-21 MED ORDER — NITROGLYCERIN 0.4 MG SL SUBL: 0.4000 mg | SUBLINGUAL_TABLET | SUBLINGUAL | Status: DC | PRN

## 2018-05-21 MED ORDER — LIDOCAINE HCL (PF) 1 % IJ SOLN
INTRAMUSCULAR | Status: AC
  Filled 2018-05-21: qty 60

## 2018-05-21 MED ORDER — QUETIAPINE FUMARATE 25 MG PO TABS
25.0000 mg | ORAL_TABLET | Freq: Every day | ORAL | Status: DC
  Administered 2018-05-21: 25 mg via ORAL
  Filled 2018-05-21: qty 1

## 2018-05-21 MED ORDER — SODIUM CHLORIDE 0.9 % IV SOLN
INTRAVENOUS | Status: DC
  Administered 2018-05-21 (×2): via INTRAVENOUS

## 2018-05-21 MED ORDER — HEPARIN (PORCINE) IN NACL 1000-0.9 UT/500ML-% IV SOLN
INTRAVENOUS | Status: AC
  Filled 2018-05-21: qty 500

## 2018-05-21 MED ORDER — CEFAZOLIN SODIUM-DEXTROSE 1-4 GM/50ML-% IV SOLN
1.0000 g | Freq: Once | INTRAVENOUS | Status: AC
  Administered 2018-05-22: 10:00:00 1 g via INTRAVENOUS
  Filled 2018-05-21: qty 50

## 2018-05-21 MED ORDER — ONDANSETRON HCL 4 MG/2ML IJ SOLN: 4.0000 mg | Freq: Four times a day (QID) | INTRAMUSCULAR | Status: DC | PRN

## 2018-05-21 MED ORDER — CEFAZOLIN SODIUM-DEXTROSE 1-4 GM/50ML-% IV SOLN: 1.0000 g | Freq: Once | INTRAVENOUS | Status: DC

## 2018-05-21 MED ORDER — MIDAZOLAM HCL 5 MG/5ML IJ SOLN
INTRAMUSCULAR | Status: AC
  Filled 2018-05-21: qty 5

## 2018-05-21 MED ORDER — HEPARIN (PORCINE) IN NACL 1000-0.9 UT/500ML-% IV SOLN
INTRAVENOUS | Status: DC | PRN
  Administered 2018-05-21: 500 mL

## 2018-05-21 MED ORDER — MUPIROCIN 2 % EX OINT
TOPICAL_OINTMENT | CUTANEOUS | Status: AC
  Administered 2018-05-21: 1
  Filled 2018-05-21: qty 22

## 2018-05-21 MED ORDER — FENTANYL CITRATE (PF) 100 MCG/2ML IJ SOLN
INTRAMUSCULAR | Status: AC
  Filled 2018-05-21: qty 2

## 2018-05-21 MED ORDER — ADULT MULTIVITAMIN W/MINERALS CH
0.5000 | ORAL_TABLET | Freq: Every day | ORAL | Status: DC
  Administered 2018-05-21: 0.5 via ORAL
  Filled 2018-05-21: qty 1

## 2018-05-21 MED ORDER — ISOSORB DINITRATE-HYDRALAZINE 20-37.5 MG PO TABS
1.0000 | ORAL_TABLET | Freq: Two times a day (BID) | ORAL | Status: DC
  Administered 2018-05-21 – 2018-05-22 (×2): 1 via ORAL
  Filled 2018-05-21 (×2): qty 1

## 2018-05-21 MED ORDER — METOPROLOL SUCCINATE ER 100 MG PO TB24
100.0000 mg | ORAL_TABLET | Freq: Every day | ORAL | Status: DC
  Administered 2018-05-21: 100 mg via ORAL
  Filled 2018-05-21: qty 1

## 2018-05-21 MED ORDER — FUROSEMIDE 40 MG PO TABS
40.0000 mg | ORAL_TABLET | Freq: Every day | ORAL | Status: DC
  Administered 2018-05-21 – 2018-05-22 (×2): 40 mg via ORAL
  Filled 2018-05-21 (×2): qty 1

## 2018-05-21 MED ORDER — CHLORHEXIDINE GLUCONATE 4 % EX LIQD: 60.0000 mL | Freq: Once | CUTANEOUS | Status: DC

## 2018-05-21 MED ORDER — ASPIRIN EC 81 MG PO TBEC
81.0000 mg | DELAYED_RELEASE_TABLET | Freq: Every day | ORAL | Status: DC
  Administered 2018-05-21: 21:00:00 81 mg via ORAL
  Filled 2018-05-21: qty 1

## 2018-05-21 SURGICAL SUPPLY — 14 items
ADAPTER SEALING SSA-EW-09 (MISCELLANEOUS) ×2 IMPLANT
CABLE SURGICAL S-101-97-12 (CABLE) ×2 IMPLANT
CATH ATTAIN COM SURV 6250V-MB2 (CATHETERS) ×2 IMPLANT
DEVICE CRTP PERCEPTA QUAD MRI (Pacemaker) ×2 IMPLANT
LEAD CAPSURE NOVUS 5076-52CM (Lead) ×2 IMPLANT
LEAD CAPSURE NOVUS 5076-58CM (Lead) ×2 IMPLANT
LEAD QUARTET 1458Q-86CM (Lead) ×2 IMPLANT
PAD DEFIB LIFELINK (PAD) ×2 IMPLANT
SHEATH CLASSIC 7F (SHEATH) ×4 IMPLANT
SHEATH CLASSIC 9.5F (SHEATH) ×2 IMPLANT
SLITTER 6232ADJ (MISCELLANEOUS) ×2 IMPLANT
TRAY PACEMAKER INSERTION (PACKS) ×2 IMPLANT
WIRE ACUITY WHISPER EDS 4648 (WIRE) ×2 IMPLANT
WIRE HI TORQ VERSACORE-J 145CM (WIRE) ×2 IMPLANT

## 2018-05-21 NOTE — Progress Notes (Signed)
Patient received from cath lab via bed transported by cath lab staff. Alert oriented, denies any pain. Left upper chest assessed at Bi-V ICD site, clean and dermabond dry and intact. No swelling, redness noted at site. Patient stated she wanted to sleep, resting  Quietly with eyes closed.

## 2018-05-21 NOTE — Interval H&P Note (Signed)
History and Physical Interval Note:  05/21/2018 12:25 PM  Betty [name]  has presented today for surgery, with the diagnosis of cm  The various methods of treatment have been discussed with the patient and family. After consideration of risks, benefits and other options for treatment, the patient has consented to  Procedure(s): BIV PACEMAKER INSERTION CRT-P (N/A) as a surgical intervention .  The patient's history has been reviewed, patient examined, no change in status, stable for surgery.  I have reviewed the patient's chart and labs.  Questions were answered to the patient's satisfaction.     Virl Axe

## 2018-05-21 NOTE — Interval H&P Note (Signed)
History and Physical Interval Note:  05/21/2018 12:50 PM  Betty [name]  has presented today for surgery, with the diagnosis of cm  The various methods of treatment have been discussed with the patient and family. After consideration of risks, benefits and other options for treatment, the patient has consented to  Procedure(s): BIV PACEMAKER INSERTION CRT-P (N/A) as a surgical intervention .  The patient's history has been reviewed, patient examined, no change in status, stable for surgery.  I have reviewed the patient's chart and labs.  Questions were answered to the patient's satisfaction.     Virl Axe

## 2018-05-21 NOTE — Interval H&P Note (Signed)
History and Physical Interval Note:  05/21/2018 12:26 PM  Betty [name]  has presented today for surgery, with the diagnosis of cm  The various methods of treatment have been discussed with the patient and family. After consideration of risks, benefits and other options for treatment, the patient has consented to  Procedure(s): BIV PACEMAKER INSERTION CRT-P (N/A) as a surgical intervention .  The patient's history has been reviewed, patient examined, no change in status, stable for surgery.  I have reviewed the patient's chart and labs.  Questions were answered to the patient's satisfaction.     Virl Axe

## 2018-05-21 NOTE — Interval H&P Note (Signed)
History and Physical Interval Note:  05/21/2018 12:50 PM  [name]  has presented today for surgery, with the diagnosis of cm  The various methods of treatment have been discussed with the patient and family. After consideration of risks, benefits and other options for treatment, the patient has consented to  Procedure(s): BIV PACEMAKER INSERTION CRT-P (N/A) as a surgical intervention .  The patient's history has been reviewed, patient examined, no change in status, stable for surgery.  I have reviewed the patient's chart and labs.  Questions were answered to the patient's satisfaction.     Cr >>3.2   I spoke with Drs Florene Glen and Tamala Julian regarding renal function and anticipated contrast load  They are in agreement about proceeding and the latter and I have reviewed with family who are in agreement to proceeding    Betty [name]

## 2018-05-22 ENCOUNTER — Ambulatory Visit (HOSPITAL_COMMUNITY): Payer: Medicare Other

## 2018-05-22 ENCOUNTER — Encounter (HOSPITAL_COMMUNITY): Payer: Self-pay | Admitting: Internal Medicine

## 2018-05-22 ENCOUNTER — Other Ambulatory Visit: Payer: Self-pay | Admitting: Physician Assistant

## 2018-05-22 DIAGNOSIS — I447 Left bundle-branch block, unspecified: Secondary | ICD-10-CM | POA: Diagnosis not present

## 2018-05-22 DIAGNOSIS — E7800 Pure hypercholesterolemia, unspecified: Secondary | ICD-10-CM | POA: Diagnosis not present

## 2018-05-22 DIAGNOSIS — M1990 Unspecified osteoarthritis, unspecified site: Secondary | ICD-10-CM | POA: Diagnosis not present

## 2018-05-22 DIAGNOSIS — Z902 Acquired absence of lung [part of]: Secondary | ICD-10-CM | POA: Diagnosis not present

## 2018-05-22 DIAGNOSIS — N184 Chronic kidney disease, stage 4 (severe): Secondary | ICD-10-CM

## 2018-05-22 DIAGNOSIS — I428 Other cardiomyopathies: Secondary | ICD-10-CM | POA: Diagnosis not present

## 2018-05-22 DIAGNOSIS — I252 Old myocardial infarction: Secondary | ICD-10-CM | POA: Diagnosis not present

## 2018-05-22 DIAGNOSIS — J90 Pleural effusion, not elsewhere classified: Secondary | ICD-10-CM | POA: Diagnosis not present

## 2018-05-22 DIAGNOSIS — J449 Chronic obstructive pulmonary disease, unspecified: Secondary | ICD-10-CM | POA: Diagnosis not present

## 2018-05-22 DIAGNOSIS — I130 Hypertensive heart and chronic kidney disease with heart failure and stage 1 through stage 4 chronic kidney disease, or unspecified chronic kidney disease: Secondary | ICD-10-CM | POA: Diagnosis not present

## 2018-05-22 DIAGNOSIS — I5043 Acute on chronic combined systolic (congestive) and diastolic (congestive) heart failure: Secondary | ICD-10-CM | POA: Diagnosis not present

## 2018-05-22 DIAGNOSIS — I2510 Atherosclerotic heart disease of native coronary artery without angina pectoris: Secondary | ICD-10-CM | POA: Diagnosis not present

## 2018-05-22 DIAGNOSIS — F0390 Unspecified dementia without behavioral disturbance: Secondary | ICD-10-CM | POA: Diagnosis not present

## 2018-05-22 NOTE — Discharge Instructions (Signed)
° ° °  Supplemental Discharge Instructions for  Pacemaker/Defibrillator Patients  Activity No heavy lifting or vigorous activity with your left/right arm for 6 to 8 weeks.  Do not raise your left/right arm above your head for one week.  Gradually raise your affected arm as drawn below.              05/25/18                     05/26/18                     05/27/18                  05/28/18 __  NO DRIVING for  1 week   ; you may begin driving on  3/61/44 .  WOUND CARE - Keep the wound area clean and dry.  Do not get this area wet for one week. No showers for 24 hours; you may shower on  05/23/18   . - The tape/steri-strips on your wound will fall off; do not pull them off.  No bandage is needed on the site.  DO  NOT apply any creams, oils, or ointments to the wound area. - If you notice any drainage or discharge from the wound, any swelling or bruising at the site, or you develop a fever > 101? F after you are discharged home, call the office at once.  Special Instructions - You are still able to use cellular telephones; use the ear opposite the side where you have your pacemaker/defibrillator.  Avoid carrying your cellular phone near your device. - When traveling through airports, show security personnel your identification card to avoid being screened in the metal detectors.  Ask the security personnel to use the hand wand. - Avoid arc welding equipment, MRI testing (magnetic resonance imaging), TENS units (transcutaneous nerve stimulators).  Call the office for questions about other devices. - Avoid electrical appliances that are in poor condition or are not properly grounded. - Microwave ovens are safe to be near or to operate.  Additional information for defibrillator patients should your device go off: - If your device goes off ONCE and you feel fine afterward, notify the device clinic nurses. - If your device goes off ONCE and you do not feel well afterward, call 911. - If your device goes  off TWICE, call 911. - If your device goes off THREE times in one day, call 911.  DO NOT DRIVE YOURSELF OR A FAMILY MEMBER WITH A DEFIBRILLATOR TO THE HOSPITAL--CALL 911.

## 2018-05-22 NOTE — Discharge Summary (Signed)
ELECTROPHYSIOLOGY PROCEDURE DISCHARGE SUMMARY    Patient ID: Betty [name],  MRN: [id number], DOB/AGE: 07/15/1946 72 y.o.  Admit date: 05/21/2018 Discharge date: 05/22/2018  Primary Care Physician: Josetta Huddle, MD  Primary Cardiologist: Dr. Tamala Julian Electrophysiologist: Dr. Caryl Comes  Primary Discharge Diagnosis:  1. ICM 2. Chronic CHF 3. LBBB  Secondary Discharge Diagnosis:  1. CAD 2. HTN 3. CKD (III) 4. COPD 5. Dementia   Allergies  Allergen Reactions  . Sulfa Antibiotics Itching     Procedures This Admission:  1.  Implantation of a MDT CRT- PPM on 7/171/9 by Dr Caryl Comes.  The patient received a Medtronic MRI compatible pulse generator serial number [id number]. Medtronic MRI compatible 5076 (R) ventricular lead serial , St Jude 1458-lead serial number [id number] (LV), Medtronic MRI compatible 5076 atrial lead serial number [id number]  There were no immediate post procedure complications. 2.  CXR on 05/22/18 demonstrated no pneumothorax status post device implantation.   Brief HPI: LYSANDRA [name] is a 72 y.o. female was referred to electrophysiology in the outpatient setting for consideration of CRT-PPM implantation.  Past medical history includes above.  The patient has ICM, recurrent CHF, LBBB.  Risks, benefits, and alternatives to CRT-PPM implantation were reviewed with the patient who wished to proceed.   Hospital Course:  The patient was admitted and underwent implantation of a PPM with details as outlined above. She was monitored on telemetry overnight which demonstrated SR, V pacing.  Left chest was without hematoma or ecchymosis.  The device was interrogated and found to be functioning normally.  CXR was obtained and demonstrated no pneumothorax, + R pleural effusion, status post device implantation.  Pulm exam is clear b/l, she has no SOB, and exam does not suggest fluid OL.  D/w Dr. Caryl Comes, continue home meds. Wound care, arm mobility, and restrictions were reviewed with the  patient/and written in AVS for family.  The patient feels well this AM, no CP or SOB, she was examined by Dr. Caryl Comes and considered stable for discharge to home.    Physical Exam: Vitals:   05/21/18 2000 05/21/18 2003 05/22/18 0613 05/22/18 0758  BP:  119/64 124/80 (!) 100/53  Pulse: 88 88 82 86  Resp: 20 20 20    Temp:  (!) 97.4 F (36.3 C) (!) 97.4 F (36.3 C) (!) 97.5 F (36.4 C)  TempSrc:  Oral Oral Oral  SpO2: 97% 100% 100% 100%  Weight:   130 lb 1.1 oz (59 kg)   Height:        GEN- The patient is well appearing, thin, alert and oriented x 3 today.   HEENT: normocephalic, atraumatic; sclera clear, conjunctiva pink; hearing intact; oropharynx clear; neck supple, no JVP Lungs- CTA b/l, normal work of breathing.  No wheezes, rales, rhonchi Heart- RRR, no murmurs, rubs or gallops, PMI not laterally displaced GI- soft, non-tender, non-distended Extremities- no clubbing, cyanosis, or edema; MS- no significant deformity, age appropriate atrophy Skin- warm and dry, no rash or lesion, left chest without hematoma/ecchymosis Psych- euthymic mood, full affect Neuro- no gross deficits, she is forgetful, easily re-oriented   Labs:   Lab Results  Component Value Date   WBC 7.7 04/26/2018   HGB 10.9 (L) 04/26/2018   HCT 33.2 (L) 04/26/2018   MCV 90.2 04/26/2018   PLT 284 04/26/2018    Recent Labs  Lab 05/20/18 1144  NA 142  K 3.6  CL 101  CO2 20  BUN 31*  CREATININE 3.20*  CALCIUM 9.9  GLUCOSE 114*    Discharge Medications:  Allergies as of 05/22/2018      Reactions   Sulfa Antibiotics Itching      Medication List    TAKE these medications   acetaminophen 325 MG tablet Commonly known as:  TYLENOL Take 650 mg by mouth every 6 (six) hours as needed for headache (pain). Reported on 03/27/2016   aspirin EC 81 MG tablet Take 81 mg by mouth at bedtime.   atorvastatin 40 MG tablet Commonly known as:  LIPITOR Take 1 tablet (40 mg total) by mouth daily at 6 PM.     furosemide 40 MG tablet Commonly known as:  LASIX Take 40 mg by mouth daily.   isosorbide-hydrALAZINE 20-37.5 MG tablet Commonly known as:  BIDIL Take 1 tablet by mouth 2 (two) times daily.   metoprolol succinate 100 MG 24 hr tablet Commonly known as:  TOPROL-XL Take 1 tablet (100 mg total) by mouth at bedtime. Take with or immediately following a meal.   multivitamin with minerals Tabs tablet Take 0.5 tablets by mouth at bedtime.   nitroGLYCERIN 0.4 MG SL tablet Commonly known as:  NITROSTAT Place 1 tablet (0.4 mg total) under the tongue every 5 (five) minutes x 3 doses as needed for chest pain.   QUEtiapine 25 MG tablet Commonly known as:  SEROQUEL Take 1 tablet (25 mg total) by mouth at bedtime.   VITAMIN D3 PO Take 1 capsule by mouth at bedtime.       Disposition:  Home  Discharge Instructions    Diet - low sodium heart healthy   Complete by:  As directed    Increase activity slowly   Complete by:  As directed      Follow-up Information    [facility] Follow up on 06/02/2018.   Specialty:  Cardiology Why:  3:15PM Lab appointment 3:30PM, wound check visit Contact information: [address] [place]       Deboraha Sprang, MD Follow up on 08/26/2018.   Specialty:  Cardiology Why:  2:15PM Contact information: [address] [address] [phone]           Duration of Discharge Encounter: Greater than 30 minutes including physician time.  Venetia Night, PA-C 05/22/2018 10:06 AM

## 2018-05-23 DIAGNOSIS — I214 Non-ST elevation (NSTEMI) myocardial infarction: Secondary | ICD-10-CM | POA: Diagnosis not present

## 2018-05-23 DIAGNOSIS — I130 Hypertensive heart and chronic kidney disease with heart failure and stage 1 through stage 4 chronic kidney disease, or unspecified chronic kidney disease: Secondary | ICD-10-CM | POA: Diagnosis not present

## 2018-05-23 DIAGNOSIS — I5043 Acute on chronic combined systolic (congestive) and diastolic (congestive) heart failure: Secondary | ICD-10-CM | POA: Diagnosis not present

## 2018-05-23 DIAGNOSIS — I2510 Atherosclerotic heart disease of native coronary artery without angina pectoris: Secondary | ICD-10-CM | POA: Diagnosis not present

## 2018-05-23 DIAGNOSIS — N183 Chronic kidney disease, stage 3 (moderate): Secondary | ICD-10-CM | POA: Diagnosis not present

## 2018-05-23 DIAGNOSIS — J439 Emphysema, unspecified: Secondary | ICD-10-CM | POA: Diagnosis not present

## 2018-05-27 DIAGNOSIS — I5043 Acute on chronic combined systolic (congestive) and diastolic (congestive) heart failure: Secondary | ICD-10-CM | POA: Diagnosis not present

## 2018-05-27 DIAGNOSIS — J439 Emphysema, unspecified: Secondary | ICD-10-CM | POA: Diagnosis not present

## 2018-05-27 DIAGNOSIS — I130 Hypertensive heart and chronic kidney disease with heart failure and stage 1 through stage 4 chronic kidney disease, or unspecified chronic kidney disease: Secondary | ICD-10-CM | POA: Diagnosis not present

## 2018-05-27 DIAGNOSIS — N183 Chronic kidney disease, stage 3 (moderate): Secondary | ICD-10-CM | POA: Diagnosis not present

## 2018-05-27 DIAGNOSIS — I2510 Atherosclerotic heart disease of native coronary artery without angina pectoris: Secondary | ICD-10-CM | POA: Diagnosis not present

## 2018-05-27 DIAGNOSIS — I214 Non-ST elevation (NSTEMI) myocardial infarction: Secondary | ICD-10-CM | POA: Diagnosis not present

## 2018-06-02 ENCOUNTER — Other Ambulatory Visit: Payer: Medicare Other | Admitting: *Deleted

## 2018-06-02 ENCOUNTER — Other Ambulatory Visit: Payer: Self-pay

## 2018-06-02 ENCOUNTER — Ambulatory Visit (INDEPENDENT_AMBULATORY_CARE_PROVIDER_SITE_OTHER): Payer: Medicare Other | Admitting: *Deleted

## 2018-06-02 DIAGNOSIS — N184 Chronic kidney disease, stage 4 (severe): Secondary | ICD-10-CM | POA: Diagnosis not present

## 2018-06-02 DIAGNOSIS — I428 Other cardiomyopathies: Secondary | ICD-10-CM

## 2018-06-02 DIAGNOSIS — I5022 Chronic systolic (congestive) heart failure: Secondary | ICD-10-CM

## 2018-06-02 LAB — CUP PACEART INCLINIC DEVICE CHECK
Battery Voltage: 3.14 V
Brady Statistic AP VP Percent: 0.04 %
Brady Statistic AS VP Percent: 98.59 %
Brady Statistic RA Percent Paced: 0.06 %
Date Time Interrogation Session: 20190729161911
Implantable Lead Implant Date: 20190717
Implantable Lead Implant Date: 20190717
Implantable Lead Location: 753858
Implantable Lead Location: 753859
Implantable Lead Location: 753860
Implantable Lead Model: 5076
Implantable Pulse Generator Implant Date: 20190717
Lead Channel Impedance Value: 1026 Ohm
Lead Channel Impedance Value: 1292 Ohm
Lead Channel Impedance Value: 380 Ohm
Lead Channel Impedance Value: 418 Ohm
Lead Channel Impedance Value: 456 Ohm
Lead Channel Impedance Value: 532 Ohm
Lead Channel Impedance Value: 760 Ohm
Lead Channel Impedance Value: 836 Ohm
Lead Channel Pacing Threshold Amplitude: 2.25 V
Lead Channel Pacing Threshold Pulse Width: 0.4 ms
Lead Channel Sensing Intrinsic Amplitude: 11.25 mV
Lead Channel Setting Pacing Amplitude: 3.5 V
Lead Channel Setting Pacing Amplitude: 3.5 V
Lead Channel Setting Pacing Pulse Width: 0.4 ms
Lead Channel Setting Pacing Pulse Width: 0.6 ms
MDC IDC LEAD IMPLANT DT: 20190717
MDC IDC MSMT BATTERY REMAINING LONGEVITY: 79 mo
MDC IDC MSMT LEADCHNL LV IMPEDANCE VALUE: 1197 Ohm
MDC IDC MSMT LEADCHNL LV IMPEDANCE VALUE: 513 Ohm
MDC IDC MSMT LEADCHNL LV IMPEDANCE VALUE: 969 Ohm
MDC IDC MSMT LEADCHNL LV PACING THRESHOLD PULSEWIDTH: 0.6 ms
MDC IDC MSMT LEADCHNL RA PACING THRESHOLD AMPLITUDE: 0.5 V
MDC IDC MSMT LEADCHNL RA SENSING INTR AMPL: 2.25 mV
MDC IDC MSMT LEADCHNL RV PACING THRESHOLD AMPLITUDE: 0.75 V
MDC IDC MSMT LEADCHNL RV PACING THRESHOLD PULSEWIDTH: 0.4 ms
MDC IDC SET LEADCHNL LV PACING AMPLITUDE: 4 V
MDC IDC SET LEADCHNL RV SENSING SENSITIVITY: 1.2 mV
MDC IDC STAT BRADY AP VS PERCENT: 0.02 %
MDC IDC STAT BRADY AS VS PERCENT: 1.35 %
MDC IDC STAT BRADY RV PERCENT PACED: 0.17 %

## 2018-06-02 NOTE — Progress Notes (Signed)
Wound check appointment. Dermabond removed. Wound without redness or edema. Incision edges approximated, wound well healed. Normal device function. Thresholds, sensing, and impedances consistent with implant measurements. Device programmed RA and RV at 3.5V, LV at 4.0V on for extra safety margin until 3 month visit. Histogram distribution appropriate for patient and level of activity. No mode switches. 1 high ventricular rates noted-- EGMs NSVT, 10 beats, Avg V rate 87/181 bpm. Patient educated about wound care, arm mobility, lifting restrictions. ROV in 3 with SK 08/26/18

## 2018-06-03 DIAGNOSIS — J439 Emphysema, unspecified: Secondary | ICD-10-CM | POA: Diagnosis not present

## 2018-06-03 DIAGNOSIS — I2510 Atherosclerotic heart disease of native coronary artery without angina pectoris: Secondary | ICD-10-CM | POA: Diagnosis not present

## 2018-06-03 DIAGNOSIS — N183 Chronic kidney disease, stage 3 (moderate): Secondary | ICD-10-CM | POA: Diagnosis not present

## 2018-06-03 DIAGNOSIS — I5043 Acute on chronic combined systolic (congestive) and diastolic (congestive) heart failure: Secondary | ICD-10-CM | POA: Diagnosis not present

## 2018-06-03 DIAGNOSIS — I214 Non-ST elevation (NSTEMI) myocardial infarction: Secondary | ICD-10-CM | POA: Diagnosis not present

## 2018-06-03 DIAGNOSIS — I130 Hypertensive heart and chronic kidney disease with heart failure and stage 1 through stage 4 chronic kidney disease, or unspecified chronic kidney disease: Secondary | ICD-10-CM | POA: Diagnosis not present

## 2018-06-03 LAB — BASIC METABOLIC PANEL
BUN/Creatinine Ratio: 12 (ref 12–28)
BUN: 28 mg/dL — ABNORMAL HIGH (ref 8–27)
CHLORIDE: 102 mmol/L (ref 96–106)
CO2: 21 mmol/L (ref 20–29)
Calcium: 10.4 mg/dL — ABNORMAL HIGH (ref 8.7–10.3)
Creatinine, Ser: 2.34 mg/dL — ABNORMAL HIGH (ref 0.57–1.00)
GFR calc Af Amer: 23 mL/min/{1.73_m2} — ABNORMAL LOW (ref >59)
GFR calc non Af Amer: 20 mL/min/{1.73_m2} — ABNORMAL LOW (ref >59)
GLUCOSE: 92 mg/dL (ref 65–99)
POTASSIUM: 4 mmol/L (ref 3.5–5.2)
SODIUM: 143 mmol/L (ref 134–144)

## 2018-06-04 ENCOUNTER — Ambulatory Visit: Payer: Medicare Other | Admitting: Interventional Cardiology

## 2018-06-12 ENCOUNTER — Other Ambulatory Visit: Payer: Medicare Other

## 2018-06-12 DIAGNOSIS — N184 Chronic kidney disease, stage 4 (severe): Secondary | ICD-10-CM | POA: Diagnosis not present

## 2018-06-23 DIAGNOSIS — I428 Other cardiomyopathies: Secondary | ICD-10-CM | POA: Diagnosis not present

## 2018-06-23 DIAGNOSIS — N184 Chronic kidney disease, stage 4 (severe): Secondary | ICD-10-CM | POA: Diagnosis not present

## 2018-06-23 DIAGNOSIS — Z950 Presence of cardiac pacemaker: Secondary | ICD-10-CM | POA: Diagnosis not present

## 2018-06-23 DIAGNOSIS — N2581 Secondary hyperparathyroidism of renal origin: Secondary | ICD-10-CM | POA: Diagnosis not present

## 2018-06-23 DIAGNOSIS — I129 Hypertensive chronic kidney disease with stage 1 through stage 4 chronic kidney disease, or unspecified chronic kidney disease: Secondary | ICD-10-CM | POA: Diagnosis not present

## 2018-06-26 NOTE — Progress Notes (Signed)
Cardiology Office Note   Date:  06/27/2018   ID:  Betty [name], Betty [name] May 16, 1946, MRN [id number]  PCP:  Josetta Huddle, MD  Cardiologist:  Dr. Tamala Julian    Chief Complaint  Patient presents with  . Cardiomyopathy      History of Present Illness: Betty [name] is a 72 y.o. female who presents for NICM.     She has a hx of CKD stage III, essential HTN, stage Ib lung cancer (s/pchemotherapy and surgical intervention), HLD, COPD, dementiaand recent dx of severe chronic systolic heart failure with EF of 15% per echocardiogram by PCP. She had a recent hospitalization for acute CHF and NSTEMI with aborted cath (04/01/18) secondary to acute confusion and agitation however with presumed CAD. Came to ED with one episode of chest pain shortness of breath,little hypoxic on presentation requiring 2 L of oxygen. She was seen by Dr. Angelena Form who referred her for right and left heart cath to define her anatomy and physiology. Should be it should be known that her kidney function was CK D3. She has a known EF of 15%. By prior 2D echocardiogram.  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 90% RCA Continue medical therapy for nonischemic cardia myopathy will be recommended. The sheaths were removed and pressure held on the groin to achieve hemostasis. The patient left the lab in stable condition  + dementia.  She continued to be confused prior to discharge.MRI of the brain showed volume loss and occluded cerebral artery.   Implantation of a MDT CRT- PPM on 7/171/9 by Dr Caryl Comes.  The patient received a Medtronic MRI compatible pulse generator serial number [id number]. Medtronic MRI compatible 5076 (R) ventricular lead serial , St Jude 1458-lead serial number [id number] (LV), Medtronic MRI compatible 5076 atrial lead serial number [id number]  There were no immediate post procedure complications.  Today  she feels well and does look stronger. She has seen Dr. Florene Glen and he decreased lasix.  Her son is with her and he believes her memory has improved.  She walks daily and is eating healthy.  She is living alone and doing well.  Her son comes by daily.  No SOB and no edema.  BP is stable but borderline.     Past Medical History:  Diagnosis Date  . Acute combined systolic and diastolic (congestive) hrt fail ([facility])   . Acute mastitis of right breast 10/2003  . Arthritis    bursitis in right shoulder (05/21/2018)  . Chronic kidney disease    CKD stage 3 04/28/15  . CKD (chronic kidney disease), stage IV ([facility])   . 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 Colon polyps   . 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 Family history of breast cancer   . GERD (gastroesophageal reflux disease)   . Hypercholesterolemia   . Hypertension   . Non-small cell carcinoma of right lung, stage 1 ([facility]) 05/17/2015   Stage IB, right upper lobectomy 04/29/15   . NSCL ca dx'd 03/2015   chemo; RL Lobectomy  . NSTEMI (non-ST elevated myocardial infarction) ([facility]) 03/24/2018  . Presence of permanent cardiac pacemaker 05/21/2018  . Pure hypercholesterolemia   . Sciatica of right side   . Seasonal allergic rhinitis   . Tobacco dependence   . Tubular adenoma of colon   . Vitamin D deficiency     Past Surgical History:  Procedure Laterality  Date  . BIV PACEMAKER INSERTION CRT-P N/A 05/21/2018   Procedure: BIV PACEMAKER INSERTION CRT-P;  Surgeon: Deboraha Sprang, MD;  Location: [facility];  Service: Cardiovascular;  Laterality: N/A;  . BREAST SURGERY     small mass removed from left breast--benign  . CRYO INTERCOSTAL NERVE BLOCK Right 04/29/2015   Procedure: CRYO INTERCOSTAL NERVE BLOCK;  Surgeon: [name], MD;  Location: [facility];  Service: Thoracic;  Laterality: Right;  . INSERT / REPLACE / REMOVE PACEMAKER  05/21/2018  . LOBECTOMY Right 04/29/2015   Procedure:  RIGHT LOWER LUNG LOBECTOMY ;  Surgeon: [name], MD;  Location: [facility];  Service: Thoracic;  Laterality: Right;  . LYMPH NODE DISSECTION Right 04/29/2015   Procedure: RIGHT LUNG LYMPH NODE DISSECTION;  Surgeon: [name], MD;  Location: [facility];  Service: Thoracic;  Laterality: Right;  . RIGHT/LEFT HEART CATH AND CORONARY ANGIOGRAPHY N/A 04/24/2018   Procedure: RIGHT/LEFT HEART CATH AND CORONARY ANGIOGRAPHY;  Surgeon: [name], MD;  Location: [facility];  Service: Cardiovascular;  Laterality: N/A;  . VAGINAL DELIVERY     51 yrs ago  . VIDEO ASSISTED THORACOSCOPY (VATS)/ LOBECTOMY Right 04/29/2015   Procedure: RIGHT VIDEO ASSISTED THORACOSCOPY (VATS) WEDGE RESECTION/ RIGHT LOWER LOBECTOMY, CRYO-ANALGESIA OF INTERCOSTAL NERVES;  Surgeon: [name], MD;  Location: [facility];  Service: Thoracic;  Laterality: Right;     Current Outpatient Medications  Medication Sig Dispense Refill  . acetaminophen (TYLENOL) 325 MG tablet Take 650 mg by mouth every 6 (six) hours as needed for headache (pain). Reported on 03/27/2016    . aspirin EC 81 MG tablet Take 81 mg by mouth at bedtime.    Marland Kitchen atorvastatin (LIPITOR) 40 MG tablet Take 1 tablet (40 mg total) by mouth daily at 6 PM. 90 tablet 3  . Cholecalciferol (VITAMIN D3 PO) Take 1 capsule by mouth at bedtime.    . furosemide (LASIX) 40 MG tablet Take 20 mg by mouth daily.     . isosorbide-hydrALAZINE (BIDIL) 20-37.5 MG tablet Take 1 tablet by mouth 2 (two) times daily. 180 tablet 3  . metoprolol succinate (TOPROL-XL) 100 MG 24 hr tablet Take 1 tablet (100 mg total) by mouth at bedtime. Take with or immediately following a meal. 90 tablet 3  . Multiple Vitamin (MULTIVITAMIN WITH MINERALS) TABS tablet Take 0.5 tablets by mouth at bedtime.    . nitroGLYCERIN (NITROSTAT) 0.4 MG SL tablet Place 1 tablet (0.4 mg total) under the tongue every 5 (five) minutes x 3 doses as needed for chest pain. 25 tablet 12  . QUEtiapine  (SEROQUEL) 25 MG tablet Take 1 tablet (25 mg total) by mouth at bedtime. 90 tablet 3   No current facility-administered medications for this visit.     Allergies:   Sulfa antibiotics    Social History:  The patient  reports that she quit smoking about 3 years ago. Her smoking use included cigarettes. She has a 35.25 pack-year smoking history. She has never used smokeless tobacco. She reports that she drinks alcohol. She reports that she does not use drugs.   Family History:  The patient's family history includes Cancer in her father and sister; Heart attack in her brother; Other in her son.    ROS:  General:no colds or fevers, no weight changes Skin:no rashes or ulcers HEENT:no blurred vision, no congestion CV:see HPI PUL:see HPI GI:no diarrhea constipation or melena, no indigestion GU:no hematuria, no dysuria MS:no joint pain, no  claudication Neuro:no syncope, no lightheadedness Endo:no diabetes, no thyroid disease  Wt Readings from Last 3 Encounters:  06/27/18 131 lb 6.4 oz (59.6 kg)  05/22/18 130 lb 1.1 oz (59 kg)  05/16/18 131 lb 12 oz (59.8 kg)     PHYSICAL EXAM: VS:  BP 110/66   Pulse 82   Ht 5' 3.5" (1.613 m)   Wt 131 lb 6.4 oz (59.6 kg)   SpO2 98%   BMI 22.91 kg/m  , BMI Body mass index is 22.91 kg/m. General:Pleasant affect, NAD Skin:Warm and dry, brisk capillary refill HEENT:normocephalic, sclera clear, mucus membranes moist Neck:supple, no JVD, no bruits  Heart:S1S2 RRR without murmur, gallup, rub or click Lungs:clear without rales, rhonchi, or wheezes PIR:JJOA, non tender, + BS, do not palpate liver spleen or masses Ext:no lower ext edema, 2+ pedal pulses, 2+ radial pulses Neuro:alert and oriented X 3, MAE, follows commands, + facial symmetry    EKG:  EKG is NOT ordered today.   Recent Labs: 02/04/2018: ALT 15 04/22/2018: B Natriuretic Peptide 3,923.9 04/26/2018: Hemoglobin 10.9; Platelets 284 05/15/2018: NT-Pro BNP 26,811 06/02/2018: BUN 28;  Creatinine, Ser 2.34; Potassium 4.0; Sodium 143    Lipid Panel    Component Value Date/Time   CHOL 175 03/25/2018 0216   TRIG 103 03/25/2018 0216   HDL 39 (L) 03/25/2018 0216   CHOLHDL 4.5 03/25/2018 0216   VLDL 21 03/25/2018 0216   LDLCALC 115 (H) 03/25/2018 0216       Other studies Reviewed: Additional studies/ records that were reviewed today include: .  Echo 03/03/18 Study Conclusions  - Left ventricle: LVEF is severely depressed at approximately 15% with diffuse hypokinesis; inferior akiensis. The cavity size was normal. Wall thickness was increased in a pattern of mild LVH. - Mitral valve: There was mild regurgitation. - Pericardium, extracardiac: A trivial pericardial effusion was identified.  Cardiac cath 04/24/18  IMPRESSION:Ms. Vahle has a dilated nonischemic cardia myopathy with elevated filling pressures, high wedge pressures as well as low cardiac output. Continue medical therapy for nonischemic cardia myopathy will be recommended. The sheaths were removed and pressure held on the groin to achieve hemostasis. The patient left the lab in stable condition.  Prox RCA lesion is 90% stenosed.  Right Heart Pressures Right atrial pressure- 15/16 Right ventricular pressure-49/9 Pulmonary artery pressure-63/13, mean 33 Pulmonary wedge pressure- A-wave equals 47, V wave equals 44, mean equals 31 LVEDP was 29 Cardiac index by Fick was 2.1 L/min/m, by thermodilution 1.2 L/min/m.     ASSESSMENT AND PLAN:  1.  NICM improved symptoms since BIV pacer.  Site is stable and pt doing well.  Unable to increase meds currently due to borderline BP    Cr is 2.34 and just checked by Dr. Florene Glen.    Recheck echo in 2-3 months to be ordered to eval EF, was 15%.  Backing off on lasix.  2.  CAD with 90% RCA but it is not believed that this is cause of CM  Pt is on statin  3.   BIV pacer per Dr. Caryl Comes  4.   CKD IV followed by Dr. Florene Glen  5.   Memory loss improved is  to see neuro.     Current medicines are reviewed with the patient today.  The patient Has no concerns regarding medicines.  The following changes have been made:  See above Labs/ tests ordered today include:see above  Disposition:   FU:  see above  Signed, [name], NP  06/27/2018 11:14 AM  [place], [place], [place] Phone: [phone]; Fax: [phone]

## 2018-06-27 ENCOUNTER — Ambulatory Visit (INDEPENDENT_AMBULATORY_CARE_PROVIDER_SITE_OTHER): Payer: Medicare Other | Admitting: Cardiology

## 2018-06-27 ENCOUNTER — Encounter: Payer: Self-pay | Admitting: Cardiology

## 2018-06-27 VITALS — BP 110/66 | HR 82 | Ht 63.5 in | Wt 131.4 lb

## 2018-06-27 DIAGNOSIS — N184 Chronic kidney disease, stage 4 (severe): Secondary | ICD-10-CM | POA: Diagnosis not present

## 2018-06-27 DIAGNOSIS — I428 Other cardiomyopathies: Secondary | ICD-10-CM

## 2018-06-27 DIAGNOSIS — I2510 Atherosclerotic heart disease of native coronary artery without angina pectoris: Secondary | ICD-10-CM | POA: Diagnosis not present

## 2018-06-27 DIAGNOSIS — Z950 Presence of cardiac pacemaker: Secondary | ICD-10-CM | POA: Diagnosis not present

## 2018-06-27 DIAGNOSIS — I447 Left bundle-branch block, unspecified: Secondary | ICD-10-CM | POA: Diagnosis not present

## 2018-06-27 NOTE — Patient Instructions (Addendum)
Medication Instructions:  1. Your physician recommends that you continue on your current medications as directed. Please refer to the Current Medication list given to you today.   Labwork: NONE ORDERED TODAY  Testing/Procedures: NONE ORDERED TODAY  Follow-Up: DR. Caryl Comes 08/26/2018 @ 2:15   DR. SMITH 09/29/18 @ 11:20 AM   Any Other Special Instructions Will Be Listed Below (If Applicable).     If you need a refill on your cardiac medications before your next appointment, please call your pharmacy.

## 2018-07-04 DIAGNOSIS — I2510 Atherosclerotic heart disease of native coronary artery without angina pectoris: Secondary | ICD-10-CM | POA: Diagnosis not present

## 2018-07-04 DIAGNOSIS — I5020 Unspecified systolic (congestive) heart failure: Secondary | ICD-10-CM | POA: Diagnosis not present

## 2018-07-04 DIAGNOSIS — Z0001 Encounter for general adult medical examination with abnormal findings: Secondary | ICD-10-CM | POA: Diagnosis not present

## 2018-07-04 DIAGNOSIS — N184 Chronic kidney disease, stage 4 (severe): Secondary | ICD-10-CM | POA: Diagnosis not present

## 2018-07-04 DIAGNOSIS — E559 Vitamin D deficiency, unspecified: Secondary | ICD-10-CM | POA: Diagnosis not present

## 2018-07-04 DIAGNOSIS — Z950 Presence of cardiac pacemaker: Secondary | ICD-10-CM | POA: Diagnosis not present

## 2018-07-04 DIAGNOSIS — K219 Gastro-esophageal reflux disease without esophagitis: Secondary | ICD-10-CM | POA: Diagnosis not present

## 2018-07-04 DIAGNOSIS — J309 Allergic rhinitis, unspecified: Secondary | ICD-10-CM | POA: Diagnosis not present

## 2018-07-04 DIAGNOSIS — F0150 Vascular dementia without behavioral disturbance: Secondary | ICD-10-CM | POA: Diagnosis not present

## 2018-07-11 DIAGNOSIS — N184 Chronic kidney disease, stage 4 (severe): Secondary | ICD-10-CM | POA: Diagnosis not present

## 2018-07-16 ENCOUNTER — Other Ambulatory Visit: Payer: Self-pay

## 2018-07-16 ENCOUNTER — Ambulatory Visit (INDEPENDENT_AMBULATORY_CARE_PROVIDER_SITE_OTHER): Payer: Medicare Other | Admitting: Neurology

## 2018-07-16 ENCOUNTER — Encounter: Payer: Self-pay | Admitting: Neurology

## 2018-07-16 VITALS — BP 99/60 | HR 71 | Ht 63.5 in | Wt 130.5 lb

## 2018-07-16 DIAGNOSIS — F0390 Unspecified dementia without behavioral disturbance: Secondary | ICD-10-CM | POA: Insufficient documentation

## 2018-07-16 DIAGNOSIS — F0280 Dementia in other diseases classified elsewhere without behavioral disturbance: Secondary | ICD-10-CM | POA: Diagnosis not present

## 2018-07-16 DIAGNOSIS — I2510 Atherosclerotic heart disease of native coronary artery without angina pectoris: Secondary | ICD-10-CM | POA: Diagnosis not present

## 2018-07-16 DIAGNOSIS — G301 Alzheimer's disease with late onset: Secondary | ICD-10-CM | POA: Diagnosis not present

## 2018-07-16 DIAGNOSIS — I671 Cerebral aneurysm, nonruptured: Secondary | ICD-10-CM | POA: Diagnosis not present

## 2018-07-16 HISTORY — DX: Cerebral aneurysm, nonruptured: I67.1

## 2018-07-16 HISTORY — DX: Unspecified dementia, unspecified severity, without behavioral disturbance, psychotic disturbance, mood disturbance, and anxiety: F03.90

## 2018-07-16 MED ORDER — DONEPEZIL HCL 5 MG PO TABS: 5.0000 mg | ORAL_TABLET | Freq: Every day | ORAL | 1 refills | Status: DC

## 2018-07-16 NOTE — Patient Instructions (Signed)
We will stop the Seroquel 25 mg tablet.  After 2 weeks, start Aricept 5 mg at night.  Begin Aricept (donepezil) at 5 mg at night for one month. If this medication is well-tolerated, please call our office and we will call in a prescription for the 10 mg tablets. Look out for side effects that may include nausea, diarrhea, weight loss, or stomach cramps. This medication will also cause a runny nose, therefore there is no need for allergy medications for this purpose.

## 2018-07-16 NOTE — Progress Notes (Signed)
Reason for visit: Memory disturbance  Referring physician: Dr. Frederich Cha is a 72 y.o. female  History of present illness:  Betty [name] is a 71 year old right-handed black female with a history of stage IV chronic renal insufficiency and a history of a progressive memory problem.  The patient comes in with her son.  The patient was in the hospital in April 2019, she was noted to have an ejection fraction of 15%.  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  The patient became quite confused in the hospital, she thought that her father and her sister were still alive, the patient was placed on low-dose Seroquel which has been continued.  As an outpatient, the patient was placed on Namzeric but had trouble with confusion and hallucinations, the medication was stopped.  The patient has become more oriented in her home environment, she lives alone but her son comes in to help out.  The patient is not operating motor vehicle at this time.  She needs assistance with keeping up with medications and appointments.  The patient does have some problems with remembering names for people, she mainly has a short-term memory issue.  She denies any problems with word finding.  The patient reports no headaches or dizziness or vision changes.  She has not had any problems with numbness or weakness of the extremities or difficulty with balance or difficulty controlling the bowels or the bladder.  Blood work done recently shows a normal B12 level and thyroid profile.  She is sent to this office for an evaluation.  The patient's brother also has dementia.  Past Medical History:  Diagnosis Date  . 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 Acute mastitis of right breast 10/2003  . Arthritis     bursitis in right shoulder (05/21/2018)  . Chronic kidney disease    CKD stage 3 04/28/15  . CKD (chronic kidney disease), stage IV ([place])   . 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 Colon polyps   . 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 Family history of breast cancer   . GERD (gastroesophageal reflux disease)   . Hypercholesterolemia   . Hypertension   . Non-small cell carcinoma of right lung, stage 1 ([place]) 05/17/2015   Stage IB, right upper lobectomy 04/29/15   . NSCL ca dx'd 03/2015   chemo; RL Lobectomy  . NSTEMI (non-ST elevated myocardial infarction) ([place]) 03/24/2018  . Presence of permanent cardiac pacemaker 05/21/2018  . Pure hypercholesterolemia   . Sciatica of right side   . Seasonal allergic rhinitis   . Tobacco dependence   . Tubular adenoma of colon   . Vitamin D deficiency     Past Surgical History:  Procedure Laterality Date  . BIV PACEMAKER INSERTION CRT-P N/A 05/21/2018   Procedure: BIV PACEMAKER INSERTION CRT-P;  Surgeon: Deboraha Sprang, MD;  Location: [facility];  Service: Cardiovascular;  Laterality: N/A;  . BREAST SURGERY     small mass removed from left breast--benign  . CRYO INTERCOSTAL NERVE BLOCK Right 04/29/2015   Procedure: CRYO INTERCOSTAL NERVE BLOCK;  Surgeon: [name], MD;  Location: [facility];  Service: Thoracic;  Laterality: Right;  . INSERT / REPLACE / REMOVE  PACEMAKER  05/21/2018  . LOBECTOMY Right 04/29/2015   Procedure: RIGHT LOWER LUNG LOBECTOMY ;  Surgeon: [name], MD;  Location: [facility];  Service: Thoracic;  Laterality: Right;  . LYMPH NODE DISSECTION Right 04/29/2015   Procedure: RIGHT LUNG LYMPH NODE DISSECTION;  Surgeon: [name], MD;  Location: [facility];  Service: Thoracic;  Laterality: Right;  . RIGHT/LEFT HEART CATH AND CORONARY ANGIOGRAPHY N/A 04/24/2018   Procedure: RIGHT/LEFT HEART CATH AND CORONARY ANGIOGRAPHY;  Surgeon: [name], MD;  Location:  [facility];  Service: Cardiovascular;  Laterality: N/A;  . VAGINAL DELIVERY     51 yrs ago  . VIDEO ASSISTED THORACOSCOPY (VATS)/ LOBECTOMY Right 04/29/2015   Procedure: RIGHT VIDEO ASSISTED THORACOSCOPY (VATS) WEDGE RESECTION/ RIGHT LOWER LOBECTOMY, CRYO-ANALGESIA OF INTERCOSTAL NERVES;  Surgeon: [name], MD;  Location: [facility];  Service: Thoracic;  Laterality: Right;    Family History  Problem Relation Age of Onset  . Cancer Father   . Cancer Sister   . Heart attack Brother   . Other Son        bicuspid aortic valve    Social history:  reports that she quit smoking about 3 years ago. Her smoking use included cigarettes. She has a 35.25 pack-year smoking history. She has never used smokeless tobacco. She reports that she drinks alcohol. She reports that she does not use drugs.  Medications:  Prior to Admission medications   Medication Sig Start Date End Date Taking? Authorizing Provider  acetaminophen (TYLENOL) 325 MG tablet Take 650 mg by mouth every 6 (six) hours as needed for headache (pain). Reported on 03/27/2016   Yes [provider]  aspirin EC 81 MG tablet Take 81 mg by mouth at bedtime.   Yes [provider]  atorvastatin (LIPITOR) 40 MG tablet Take 1 tablet (40 mg total) by mouth daily at 6 PM. 04/02/18  Yes Kroeger, Lorelee Cover., PA-C  Cholecalciferol (VITAMIN D3 PO) Take 1 capsule by mouth at bedtime.   Yes [provider]  furosemide (LASIX) 40 MG tablet Take 20 mg by mouth daily.    Yes [provider]  isosorbide-hydrALAZINE (BIDIL) 20-37.5 MG tablet Take 1 tablet by mouth 2 (two) times daily. 04/02/18  Yes Kroeger, Daleen Snook M., PA-C  metoprolol succinate (TOPROL-XL) 100 MG 24 hr tablet Take 1 tablet (100 mg total) by mouth at bedtime. Take with or immediately following a meal. 04/02/18  Yes Kroeger, Lorelee Cover., PA-C  Multiple Vitamin (MULTIVITAMIN WITH MINERALS) TABS tablet Take 0.5 tablets by mouth at bedtime.   Yes [provider]  nitroGLYCERIN (NITROSTAT) 0.4 MG SL tablet Place 1 tablet (0.4 mg total) under the tongue every 5 (five) minutes x 3 doses as needed for chest pain. 04/26/18  Yes Daune Perch, NP  QUEtiapine (SEROQUEL) 25 MG tablet Take 1 tablet (25 mg total) by mouth at bedtime. 04/02/18  Yes Kroeger, Lorelee Cover., PA-C      Allergies  Allergen Reactions  . Sulfa Antibiotics Itching    ROS:  Out of a complete 14 system review of symptoms, the patient complains only of the following symptoms, and all other reviewed systems are negative.  Memory loss, confusion, slurred speech  Blood pressure 99/60, pulse 71, height 5' 3.5" (1.613 m), weight 130 lb 8 oz (59.2 kg).  Physical Exam  General: The patient is alert and cooperative at the time of the examination.  Eyes: Pupils are equal, round, and reactive to light. Discs are  flat bilaterally.  Neck: The neck is supple, no carotid bruits are noted.  Respiratory: The respiratory examination is clear.  Cardiovascular: The cardiovascular examination reveals a regular rate and rhythm, no obvious murmurs or rubs are noted.  Skin: Extremities are without significant edema.  Neurologic Exam  Mental status: The patient is alert and oriented x 3 at the time of the examination. The Mini-Mental status examination done today shows a total score 24/30.  Cranial nerves: Facial symmetry is present. There is good sensation of the face to pinprick and soft touch bilaterally. The strength of the facial muscles and the muscles to head turning and shoulder shrug are normal bilaterally. Speech is well enunciated, no aphasia or dysarthria is noted. Extraocular movements are full. Visual fields are full. The tongue is midline, and the patient has symmetric elevation of the soft palate. No obvious hearing deficits are noted.  Motor: The motor testing reveals 5 over 5 strength of all 4 extremities. Good symmetric motor tone is noted throughout.  Sensory:  Sensory testing is intact to pinprick, soft touch, vibration sensation, and position sense on all 4 extremities. No evidence of extinction is noted.  Coordination: Cerebellar testing reveals good finger-nose-finger and heel-to-shin bilaterally.  Gait and station: Gait is normal. Tandem gait is slightly unsteady. Romberg is negative. No drift is seen.  Reflexes: Deep tendon reflexes are symmetric and normal bilaterally. Toes are downgoing bilaterally.   MRI brain 04/25/18:  IMPRESSION: 1. No acute intracranial process. 2. 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 For constellation findings, recommend CTA HEAD and neck. 3. Moderate global parenchymal brain volume loss. 4. Moderate chronic small vessel ischemic changes. 5. At least moderate C4-5 canal stenosis .   * MRI scan images were reviewed online. I agree with the written report.    Assessment/Plan:  1.  Progressive memory disturbance, Alzheimer's disease  2.  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  Chronic renal insufficiency  The patient has recently had a pacemaker placed, she has stage IV chronic renal insufficiency.  Evaluation and treatment of the cerebral aneurysm therefore is complicated.  I will get an opinion from Dr. Estanislado Pandy regarding this issue.  The patient will be placed on low-dose Aricept 5 mg at night.  The Seroquel will be discontinued.  The patient will come off of the Seroquel first for 2 weeks and if she is doing well, Aricept will be started.  The patient will follow-up in 6 months.  Betty Alexanders MD 07/16/2018 9:35 AM  [facility] [address] [place], [place] [address]  Phone [phone] Fax [phone]

## 2018-07-17 ENCOUNTER — Telehealth (HOSPITAL_COMMUNITY): Payer: Self-pay

## 2018-07-17 NOTE — Telephone Encounter (Signed)
Called to schedule consult, no answer, no vm. Will try back later. AW

## 2018-07-21 ENCOUNTER — Other Ambulatory Visit (HOSPITAL_COMMUNITY): Payer: Self-pay | Admitting: Interventional Radiology

## 2018-07-21 DIAGNOSIS — I729 Aneurysm of unspecified site: Secondary | ICD-10-CM

## 2018-07-25 ENCOUNTER — Ambulatory Visit (HOSPITAL_COMMUNITY)
Admission: RE | Admit: 2018-07-25 | Discharge: 2018-07-25 | Disposition: A | Discharge status: Home / Self Care | Payer: Medicare Other | Source: Ambulatory Visit | Attending: Interventional Radiology | Admitting: Interventional Radiology

## 2018-07-25 DIAGNOSIS — I729 Aneurysm of unspecified site: Secondary | ICD-10-CM

## 2018-07-25 DIAGNOSIS — I671 Cerebral aneurysm, nonruptured: Secondary | ICD-10-CM | POA: Diagnosis not present

## 2018-07-25 NOTE — Consult Note (Addendum)
Chief Complaint: Patient was seen in consultation today for right ACOM aneurysm.  Referring Physician(s): Kathrynn Ducking  Supervising Physician: Luanne Bras  Patient Status: [facility] - Out-pt  History of Present Illness: Betty [name] is a 72 y.o. female with a past medical history of hypertension, MI 2563, diastolic and systolic HF s/p pacemaker 2019, hypercholesterolemia, Alzheimer's dementia, COPD, non-small cell lung cancer, GERD, tubular adenoma of colon, CKD stage IV, vitamin D deficiency, and prior tobacco use. She was recently admitted to the hospital in 04/2018 for HF exacerbation. During this hospital stay, she became very confused and imaging studies of her brain were obtained. These findings prompted a neurology consult, and she saw Dr. Jannifer Franklin 07/16/2018 for management of memory issues.  MRI brain 04/25/2018: 1. No acute intracranial process. 2. 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 For constellation findings, recommend CTA HEAD and neck. 3. Moderate global parenchymal brain volume loss. 4. Moderate chronic small vessel ischemic changes. 5. At least moderate C4-5 canal stenosis.  IR requested by Dr. Jannifer Franklin for management of right ACOM aneurysm. Patient awake and alert sitting in chair. Accompanied by son. Complains of intermittent dizziness. States it has not happened in a few months. Denies syncope or vision changes associated with dizziness. Denies headache, weakness, numbness/tingling, vision changes, hearing changes, tinnitus, or speech difficulty.  Patient is currently taking Aspirin 81 mg once daily.   Past Medical History:  Diagnosis Date  . 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 Acute mastitis of right breast 10/2003  . Arthritis    bursitis in right shoulder (05/21/2018)  . 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 Chronic kidney disease    CKD stage 3 04/28/15    . CKD (chronic kidney disease), stage IV ([place])   . 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 Colon polyps   . 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 Dementia 07/16/2018  . Family history of breast cancer   . GERD (gastroesophageal reflux disease)   . Hypercholesterolemia   . Hypertension   . Non-small cell carcinoma of right lung, stage 1 ([place]) 05/17/2015   Stage IB, right upper lobectomy 04/29/15   . NSCL ca dx'd 03/2015   chemo; RL Lobectomy  . NSTEMI (non-ST elevated myocardial infarction) ([place]) 03/24/2018  . Presence of permanent cardiac pacemaker 05/21/2018  . Pure hypercholesterolemia   . Sciatica of right side   . Seasonal allergic rhinitis   . Tobacco dependence   . Tubular adenoma of colon   . Vitamin D deficiency     Past Surgical History:  Procedure Laterality Date  . BIV PACEMAKER INSERTION CRT-P N/A 05/21/2018   Procedure: BIV PACEMAKER INSERTION CRT-P;  Surgeon: Deboraha Sprang, MD;  Location: [facility];  Service: Cardiovascular;  Laterality: N/A;  . BREAST SURGERY     small mass removed from left breast--benign  . CRYO INTERCOSTAL NERVE BLOCK Right 04/29/2015   Procedure: CRYO INTERCOSTAL NERVE BLOCK;  Surgeon: [name], MD;  Location: [facility];  Service: Thoracic;  Laterality: Right;  . INSERT / REPLACE / REMOVE PACEMAKER  05/21/2018  . LOBECTOMY Right 04/29/2015   Procedure: RIGHT LOWER LUNG LOBECTOMY ;  Surgeon: [name], MD;  Location: [facility];  Service: Thoracic;  Laterality: Right;  . LYMPH NODE DISSECTION Right 04/29/2015   Procedure: RIGHT LUNG LYMPH NODE DISSECTION;  Surgeon: [name], MD;  [facility];  Service: Thoracic;  Laterality: Right;  . RIGHT/LEFT HEART CATH AND CORONARY ANGIOGRAPHY N/A 04/24/2018   Procedure: RIGHT/LEFT HEART CATH AND CORONARY ANGIOGRAPHY;  Surgeon: [name], MD;  Location: [facility];  Service: Cardiovascular;  Laterality: N/A;  .  VAGINAL DELIVERY     51 yrs ago  . VIDEO ASSISTED THORACOSCOPY (VATS)/ LOBECTOMY Right 04/29/2015   Procedure: RIGHT VIDEO ASSISTED THORACOSCOPY (VATS) WEDGE RESECTION/ RIGHT LOWER LOBECTOMY, CRYO-ANALGESIA OF INTERCOSTAL NERVES;  Surgeon: [name], MD;  Location: [facility];  Service: Thoracic;  Laterality: Right;    Allergies: Sulfa antibiotics  Medications: Prior to Admission medications   Medication Sig Start Date End Date Taking? Authorizing Provider  acetaminophen (TYLENOL) 325 MG tablet Take 650 mg by mouth every 6 (six) hours as needed for headache (pain). Reported on 03/27/2016    [provider]  aspirin EC 81 MG tablet Take 81 mg by mouth at bedtime.    [provider]  atorvastatin (LIPITOR) 40 MG tablet Take 1 tablet (40 mg total) by mouth daily at 6 PM. 04/02/18   Kroeger, Lorelee Cover., PA-C  Cholecalciferol (VITAMIN D3 PO) Take 1 capsule by mouth at bedtime.    [provider]  donepezil (ARICEPT) 5 MG tablet Take 1 tablet (5 mg total) by mouth at bedtime. 07/16/18   Kathrynn Ducking, MD  furosemide (LASIX) 40 MG tablet Take 20 mg by mouth daily.     [provider]  isosorbide-hydrALAZINE (BIDIL) 20-37.5 MG tablet Take 1 tablet by mouth 2 (two) times daily. 04/02/18   Kroeger, Lorelee Cover., PA-C  metoprolol succinate (TOPROL-XL) 100 MG 24 hr tablet Take 1 tablet (100 mg total) by mouth at bedtime. Take with or immediately following a meal. 04/02/18   Kroeger, Lorelee Cover., PA-C  Multiple Vitamin (MULTIVITAMIN WITH MINERALS) TABS tablet Take 0.5 tablets by mouth at bedtime.    [provider]  nitroGLYCERIN (NITROSTAT) 0.4 MG SL tablet Place 1 tablet (0.4 mg total) under the tongue every 5 (five) minutes x 3 doses as needed for chest pain. 04/26/18   Daune Perch, NP     Family History  Problem Relation Age of Onset  . Cancer Father   . Cancer Sister   . Heart attack Brother   . Dementia Brother   . Other Son        bicuspid aortic  valve  . Heart attack Brother   . Cancer Sister     Social History   Socioeconomic History  . Marital status: Single    Spouse name: Not on file  . Number of children: 1  . Years of education: 87  . Highest education level: Not on file  Occupational History  . Occupation: Retired  Scientific laboratory technician  . Financial resource strain: Not hard at all  . Food insecurity:    Worry: Patient refused    Inability: Patient refused  . Transportation needs:    Medical: Patient refused    Non-medical: Patient refused  Tobacco Use  . Smoking status: Former Smoker    Packs/day: 0.75    Years: 47.00    Pack years: 35.25    Types: Cigarettes    Last attempt to quit: 04/29/2015    Years since quitting: 3.2  . Smokeless tobacco: Never Used  Substance and Sexual Activity  . Alcohol use: Yes    Comment: 05/21/2018 "maybe 6 drinks/year"  . Drug use: Never  . Sexual activity: Not on file  Lifestyle  . Physical activity:    Days per week: Patient refused    Minutes per session: Patient refused  . Stress: Only a little  Relationships  . Social connections:    Talks on phone: Patient refused    Gets together: Patient refused    Attends religious service: Patient refused    Active member of club or organization: Patient refused    Attends meetings of clubs or organizations: Patient refused    Relationship status: Patient refused  Other Topics Concern  . Not on file  Social History Narrative   Lives alone   Caffeine use: 1 cup coffee per day   Right handed      Review of Systems: A 12 point ROS discussed and pertinent positives are indicated in the HPI above.  All other systems are negative.  Review of Systems  Constitutional: Negative for chills and fever.  HENT: Negative for hearing loss and tinnitus.   Eyes: Negative for visual disturbance.  Respiratory: Negative for shortness of breath and wheezing.   Cardiovascular: Negative for chest pain and palpitations.  Neurological: Positive  for dizziness. Negative for syncope, speech difficulty, weakness, numbness and headaches.  Psychiatric/Behavioral: Negative for behavioral problems and confusion.    Vital Signs: There were no vitals taken for this visit.  Physical Exam  Constitutional: She is oriented to person, place, and time. She appears well-developed and well-nourished. No distress.  Pulmonary/Chest: Effort normal. No respiratory distress.  Neurological: She is alert and oriented to person, place, and time.  Skin: Skin is warm and dry.  Psychiatric: She has a normal mood and affect. Her behavior is normal. Judgment and thought content normal.  Nursing note and vitals reviewed.    Imaging: No results found.  Labs:  CBC: Recent Labs    04/22/18 0647 04/23/18 0048 04/25/18 0732 04/26/18 0443  WBC 7.9 8.0 7.4 7.7  HGB 12.2 10.6* 11.4* 10.9*  HCT 37.3 33.1* 35.0* 33.2*  PLT 288 264 293 284    COAGS: Recent Labs    03/25/18 0216  INR 1.17    BMP: Recent Labs    04/29/18 1419 05/16/18 1301 05/20/18 1144 06/02/18 1505  NA 144 139 142 143  K 4.5 3.8 3.6 4.0  CL 107* 100 101 102  CO2 15* 20 20 21   GLUCOSE 92 95 114* 92  BUN 35* 33* 31* 28*  CALCIUM 10.2 9.5 9.9 10.4*  CREATININE 2.81* 3.18* 3.20* 2.34*  GFRNONAA 16* 14* 14* 20*  GFRAA 19* 16* 16* 23*    LIVER FUNCTION TESTS: Recent Labs    02/04/18 0904  BILITOT 0.5  AST 24  ALT 15  ALKPHOS 108  PROT 7.3  ALBUMIN 3.8    TUMOR MARKERS: No results for input(s): AFPTM, CEA, CA199, CHROMGRNA in the last 8760 hours.  Assessment and Plan:  Right ACOM aneurysm. Reviewed imaging with patient and son. Brought to their attention was patient's right ACOM aneurysm. Explained the nature of aneurysms, including risk of rupture.  Explained that there are two management options moving forward- either monitoring with routine image scans or with a cerebral angiogram with intervention. Both options were discussed in detail, including risks and  benefits. Informed patient that if we are to move forward with intervention, she will need to take Plavix 75 mg once daily and Aspirin 81 mg once daily for 7 days prior to procedure. In addition, patient with known CKD stage IV. Informed patient that before procedure can occur, she will require clearance from  her nephrologist, Dr. Florene Glen. Patient asks that she has time to go home and think about her decision. Encouraged patient to take her time with her decision, and to call us if she decides to move forward with intervention. If not, we will plan for monitoring with imaging scans.  Plan for follow-up in 6 months with imaging scans (either MRA brain (without contrast) or CTA head (with contrast)- will need to reassess risks/benefits, as patient has pacemaker and CKD IV). Instructed patient to call us if she decides to move forward with intervention. Informed patient that our schedulers will call her to set up this procedure. Instructed patient to continue taking Aspirin 81 mg once daily.  All questions answered and concerns addressed. Patient and son convey understanding and agree with plan.  Thank you for this interesting consult.  I greatly enjoyed meeting Betty [name] and look forward to participating in their care.  A copy of this report was sent to the requesting provider on this date.  Electronically Signed: Earley Abide, PA-C 07/25/2018, 8:59 AM   I spent a total of 30 Minutes in face to face in clinical consultation, greater than 50% of which was counseling/coordinating care for right ACOM aneurysm.

## 2018-08-04 ENCOUNTER — Ambulatory Visit (HOSPITAL_COMMUNITY): Payer: Medicare Other

## 2018-08-04 ENCOUNTER — Inpatient Hospital Stay: Payer: Medicare Other | Attending: Internal Medicine

## 2018-08-04 DIAGNOSIS — C3490 Malignant neoplasm of unspecified part of unspecified bronchus or lung: Secondary | ICD-10-CM

## 2018-08-04 DIAGNOSIS — Z85118 Personal history of other malignant neoplasm of bronchus and lung: Secondary | ICD-10-CM | POA: Diagnosis not present

## 2018-08-04 LAB — CBC WITH DIFFERENTIAL (CANCER CENTER ONLY)
Basophils Absolute: 0.1 10*3/uL (ref 0.0–0.1)
Basophils Relative: 1 %
Eosinophils Absolute: 0.1 10*3/uL (ref 0.0–0.5)
Eosinophils Relative: 2 %
HCT: 36.1 % (ref 34.8–46.6)
Hemoglobin: 12.1 g/dL (ref 11.6–15.9)
LYMPHS ABS: 1.8 10*3/uL (ref 0.9–3.3)
LYMPHS PCT: 25 %
MCH: 30.1 pg (ref 25.1–34.0)
MCHC: 33.5 g/dL (ref 31.5–36.0)
MCV: 89.9 fL (ref 79.5–101.0)
MONO ABS: 0.5 10*3/uL (ref 0.1–0.9)
MONOS PCT: 7 %
Neutro Abs: 4.5 10*3/uL (ref 1.5–6.5)
Neutrophils Relative %: 65 %
Platelet Count: 282 10*3/uL (ref 145–400)
RBC: 4.02 MIL/uL (ref 3.70–5.45)
RDW: 14.6 % — AB (ref 11.2–14.5)
WBC Count: 7 10*3/uL (ref 3.9–10.3)

## 2018-08-04 LAB — CMP (CANCER CENTER ONLY)
ALBUMIN: 3.9 g/dL (ref 3.5–5.0)
ALK PHOS: 139 U/L — AB (ref 38–126)
ALT: 46 U/L — ABNORMAL HIGH (ref 0–44)
AST: 33 U/L (ref 15–41)
Anion gap: 12 (ref 5–15)
BUN: 29 mg/dL — ABNORMAL HIGH (ref 8–23)
CO2: 21 mmol/L — ABNORMAL LOW (ref 22–32)
Calcium: 9.8 mg/dL (ref 8.9–10.3)
Chloride: 110 mmol/L (ref 98–111)
Creatinine: 2.61 mg/dL — ABNORMAL HIGH (ref 0.44–1.00)
GFR, EST NON AFRICAN AMERICAN: 17 mL/min — AB (ref >60)
GFR, Est AFR Am: 20 mL/min — ABNORMAL LOW (ref >60)
GLUCOSE: 94 mg/dL (ref 70–99)
Potassium: 3.7 mmol/L (ref 3.5–5.1)
Sodium: 143 mmol/L (ref 135–145)
Total Bilirubin: 0.6 mg/dL (ref 0.3–1.2)
Total Protein: 7.5 g/dL (ref 6.5–8.1)

## 2018-08-06 ENCOUNTER — Telehealth: Payer: Self-pay

## 2018-08-06 ENCOUNTER — Inpatient Hospital Stay: Payer: Medicare Other | Attending: Internal Medicine | Admitting: Internal Medicine

## 2018-08-06 ENCOUNTER — Encounter: Payer: Self-pay | Admitting: Internal Medicine

## 2018-08-06 DIAGNOSIS — I252 Old myocardial infarction: Secondary | ICD-10-CM | POA: Diagnosis not present

## 2018-08-06 DIAGNOSIS — E559 Vitamin D deficiency, unspecified: Secondary | ICD-10-CM | POA: Diagnosis not present

## 2018-08-06 DIAGNOSIS — Z85118 Personal history of other malignant neoplasm of bronchus and lung: Secondary | ICD-10-CM | POA: Diagnosis not present

## 2018-08-06 DIAGNOSIS — Z803 Family history of malignant neoplasm of breast: Secondary | ICD-10-CM | POA: Insufficient documentation

## 2018-08-06 DIAGNOSIS — C3490 Malignant neoplasm of unspecified part of unspecified bronchus or lung: Secondary | ICD-10-CM

## 2018-08-06 DIAGNOSIS — Z7982 Long term (current) use of aspirin: Secondary | ICD-10-CM | POA: Diagnosis not present

## 2018-08-06 DIAGNOSIS — F0390 Unspecified dementia without behavioral disturbance: Secondary | ICD-10-CM | POA: Diagnosis not present

## 2018-08-06 DIAGNOSIS — Z86010 Personal history of colonic polyps: Secondary | ICD-10-CM | POA: Diagnosis not present

## 2018-08-06 DIAGNOSIS — J449 Chronic obstructive pulmonary disease, unspecified: Secondary | ICD-10-CM | POA: Diagnosis not present

## 2018-08-06 DIAGNOSIS — Z79899 Other long term (current) drug therapy: Secondary | ICD-10-CM | POA: Diagnosis not present

## 2018-08-06 DIAGNOSIS — I129 Hypertensive chronic kidney disease with stage 1 through stage 4 chronic kidney disease, or unspecified chronic kidney disease: Secondary | ICD-10-CM | POA: Insufficient documentation

## 2018-08-06 DIAGNOSIS — Z9221 Personal history of antineoplastic chemotherapy: Secondary | ICD-10-CM | POA: Diagnosis not present

## 2018-08-06 DIAGNOSIS — N183 Chronic kidney disease, stage 3 (moderate): Secondary | ICD-10-CM | POA: Diagnosis not present

## 2018-08-06 DIAGNOSIS — F17210 Nicotine dependence, cigarettes, uncomplicated: Secondary | ICD-10-CM | POA: Diagnosis not present

## 2018-08-06 DIAGNOSIS — E7800 Pure hypercholesterolemia, unspecified: Secondary | ICD-10-CM | POA: Insufficient documentation

## 2018-08-06 DIAGNOSIS — M129 Arthropathy, unspecified: Secondary | ICD-10-CM | POA: Insufficient documentation

## 2018-08-06 NOTE — Telephone Encounter (Signed)
Printed avs and calender of upcoming appointment. Per 10/2

## 2018-08-06 NOTE — Progress Notes (Signed)
[place] [phone]   [phone]  OFFICE PROGRESS NOTE  Josetta Huddle, MD [address]  [place] [address]  DIAGNOSIS: Non-small cell carcinoma of right lung, stage 1  Staging form: Lung, AJCC 7th Edition  Clinical: Stage IB (T2a, N0, M0) - Unsigned  PRIOR THERAPY:  1) Status post right upper thorascoscopic lobectomy on 04/29/2015 under the care of Dr. Roxan Hockey. 2) Adjuvant chemotherapy with carboplatin AUC 5 given on day 1 and gemcitabine 1000 mg/m2 given on days 1 and 8 every 3 weeks. Status post 4 cycles, last dose was given 09/07/2015.  CURRENT THERAPY: Observation.  INTERVAL HISTORY: Betty [name] 72 y.o. female returns to the clinic today for follow-up visit accompanied by her son.  The patient has no complaints today except for occasional shortness of breath with exertion but no significant chest pain, cough or hemoptysis.  She denied having any fever or chills.  She has no nausea, vomiting, diarrhea or constipation.  She has no recent weight loss or night sweats.  She was supposed to have repeat CT scan of the chest before this visit but unfortunately she missed her appointment.   MEDICAL HISTORY: Past Medical History:  Diagnosis Date  . Acute combined systolic and diastolic (congestive) hrt fail ([place])   . Acute mastitis of right breast 10/2003  . Arthritis    bursitis in right shoulder (05/21/2018)  . 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 Chronic kidney disease    CKD stage 3 04/28/15  . CKD (chronic kidney disease), stage IV ([place])   . 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 Colon polyps   . 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 Dementia 07/16/2018  . Family history of breast cancer   . GERD (gastroesophageal reflux disease)   . Hypercholesterolemia   . Hypertension   . Non-small cell carcinoma of right lung,  stage 1 ([place]) 05/17/2015   Stage IB, right upper lobectomy 04/29/15   . NSCL ca dx'd 03/2015   chemo; RL Lobectomy  . NSTEMI (non-ST elevated myocardial infarction) ([place]) 03/24/2018  . Presence of permanent cardiac pacemaker 05/21/2018  . Pure hypercholesterolemia   . Sciatica of right side   . Seasonal allergic rhinitis   . Tobacco dependence   . Tubular adenoma of colon   . Vitamin D deficiency     ALLERGIES:  is allergic to sulfa antibiotics.  MEDICATIONS:  Current Outpatient Medications  Medication Sig Dispense Refill  . acetaminophen (TYLENOL) 325 MG tablet Take 650 mg by mouth every 6 (six) hours as needed for headache (pain). Reported on 03/27/2016    . aspirin EC 81 MG tablet Take 81 mg by mouth at bedtime.    Marland Kitchen atorvastatin (LIPITOR) 40 MG tablet Take 1 tablet (40 mg total) by mouth daily at 6 PM. 90 tablet 3  . Cholecalciferol (VITAMIN D3 PO) Take 1 capsule by mouth at bedtime.    . donepezil (ARICEPT) 5 MG tablet Take 1 tablet (5 mg total) by mouth at bedtime. 30 tablet 1  . furosemide (LASIX) 40 MG tablet Take 20 mg by mouth daily.     . isosorbide-hydrALAZINE (BIDIL) 20-37.5 MG tablet Take 1 tablet by mouth 2 (two) times daily. 180 tablet 3  . metoprolol succinate (TOPROL-XL) 100 MG 24 hr tablet Take 1 tablet (100 mg total) by mouth at bedtime. Take with or immediately following a meal. 90  tablet 3  . Multiple Vitamin (MULTIVITAMIN WITH MINERALS) TABS tablet Take 0.5 tablets by mouth at bedtime.    . nitroGLYCERIN (NITROSTAT) 0.4 MG SL tablet Place 1 tablet (0.4 mg total) under the tongue every 5 (five) minutes x 3 doses as needed for chest pain. 25 tablet 12   No current facility-administered medications for this visit.     SURGICAL HISTORY:  Past Surgical History:  Procedure Laterality Date  . BIV PACEMAKER INSERTION CRT-P N/A 05/21/2018   Procedure: BIV PACEMAKER INSERTION CRT-P;  Surgeon: Deboraha Sprang, MD;  Location: [facility];  Service: Cardiovascular;   Laterality: N/A;  . BREAST SURGERY     small mass removed from left breast--benign  . CRYO INTERCOSTAL NERVE BLOCK Right 04/29/2015   Procedure: CRYO INTERCOSTAL NERVE BLOCK;  Surgeon: [name], MD;  Location: [facility];  Service: Thoracic;  Laterality: Right;  . INSERT / REPLACE / REMOVE PACEMAKER  05/21/2018  . LOBECTOMY Right 04/29/2015   Procedure: RIGHT LOWER LUNG LOBECTOMY ;  Surgeon: [name], MD;  Location: [facility];  Service: Thoracic;  Laterality: Right;  . LYMPH NODE DISSECTION Right 04/29/2015   Procedure: RIGHT LUNG LYMPH NODE DISSECTION;  Surgeon: [name], MD;  Location: [facility];  Service: Thoracic;  Laterality: Right;  . RIGHT/LEFT HEART CATH AND CORONARY ANGIOGRAPHY N/A 04/24/2018   Procedure: RIGHT/LEFT HEART CATH AND CORONARY ANGIOGRAPHY;  Surgeon: [name], MD;  Location: [facility];  Service: Cardiovascular;  Laterality: N/A;  . VAGINAL DELIVERY     51 yrs ago  . VIDEO ASSISTED THORACOSCOPY (VATS)/ LOBECTOMY Right 04/29/2015   Procedure: RIGHT VIDEO ASSISTED THORACOSCOPY (VATS) WEDGE RESECTION/ RIGHT LOWER LOBECTOMY, CRYO-ANALGESIA OF INTERCOSTAL NERVES;  Surgeon: [name], MD;  Location: [facility];  Service: Thoracic;  Laterality: Right;    REVIEW OF SYSTEMS:  A comprehensive review of systems was negative except for: Respiratory: positive for dyspnea on exertion   PHYSICAL EXAMINATION: General appearance: alert, cooperative and no distress Head: Normocephalic, without obvious abnormality, atraumatic Neck: no adenopathy, no JVD, supple, symmetrical, trachea midline and thyroid not enlarged, symmetric, no tenderness/mass/nodules Lymph nodes: Cervical, supraclavicular, and axillary nodes normal. Resp: clear to auscultation bilaterally Back: symmetric, no curvature. ROM normal. No CVA tenderness. Cardio: regular rate and rhythm, S1, S2 normal, no murmur, click, rub or gallop GI: soft, non-tender; bowel sounds normal; no  masses,  no organomegaly Extremities: extremities normal, atraumatic, no cyanosis or edema  ECOG PERFORMANCE STATUS: 1 - Symptomatic but completely ambulatory  Blood pressure (!) 116/59, pulse 74, temperature 97.7 F (36.5 C), temperature source Oral, resp. rate 18, height 5' 3.5" (1.613 m), weight 128 lb 6.4 oz (58.2 kg), SpO2 97 %.  LABORATORY DATA: Lab Results  Component Value Date   WBC 7.0 08/04/2018   HGB 12.1 08/04/2018   HCT 36.1 08/04/2018   MCV 89.9 08/04/2018   PLT 282 08/04/2018      Chemistry      Component Value Date/Time   NA 143 08/04/2018 0916   NA 143 06/02/2018 1505   NA 145 09/18/2016 1057   K 3.7 08/04/2018 0916   K 4.9 09/18/2016 1057   CL 110 08/04/2018 0916   CO2 21 (L) 08/04/2018 0916   CO2 22 09/18/2016 1057   BUN 29 (H) 08/04/2018 0916   BUN 28 (H) 06/02/2018 1505   BUN 39.6 (H) 09/18/2016 1057   CREATININE 2.61 (H) 08/04/2018 0916   CREATININE 2.8 (H) 09/18/2016 1057  Component Value Date/Time   CALCIUM 9.8 08/04/2018 0916   CALCIUM 9.8 09/18/2016 1057   ALKPHOS 139 (H) 08/04/2018 0916   ALKPHOS 123 09/18/2016 1057   AST 33 08/04/2018 0916   AST 19 09/18/2016 1057   ALT 46 (H) 08/04/2018 0916   ALT 12 09/18/2016 1057   BILITOT 0.6 08/04/2018 0916   BILITOT 0.35 09/18/2016 1057       RADIOGRAPHIC STUDIES: No results found.  ASSESSMENT AND PLAN: This is a very pleasant 72 years old African-American female with a stage IB non-small cell lung cancer status post right lower lobectomy followed by 4 cycles of adjuvant chemotherapy with carboplatin and gemcitabine and tolerated her treatment fairly well. She has been in observation since November 2016. The patient is feeling fine today with no concerning complaints.  She missed the appointment for her scan last week.  I will re-arrange for the patient to have repeat CT scan of the chest without contrast in the next few days and if this scan showed no concerning findings for disease  recurrence, I will see her back for follow-up visit in 1 year with repeat CT scan of the chest. She was advised to call immediately if she has any concerning symptoms in the interval. The patient voices understanding of current disease status and treatment options and is in agreement with the current care plan.  All questions were answered. The patient knows to call the clinic with any problems, questions or concerns. We can certainly see the patient much sooner if necessary.  Disclaimer: This note was dictated with voice recognition software. Similar sounding words can inadvertently be transcribed and may not be corrected upon review.

## 2018-08-11 ENCOUNTER — Ambulatory Visit (HOSPITAL_COMMUNITY)
Admission: RE | Admit: 2018-08-11 | Discharge: 2018-08-11 | Disposition: A | Discharge status: Home / Self Care | Payer: Medicare Other | Source: Ambulatory Visit | Attending: Internal Medicine | Admitting: Internal Medicine

## 2018-08-11 DIAGNOSIS — C3491 Malignant neoplasm of unspecified part of right bronchus or lung: Secondary | ICD-10-CM | POA: Diagnosis not present

## 2018-08-11 DIAGNOSIS — C3490 Malignant neoplasm of unspecified part of unspecified bronchus or lung: Secondary | ICD-10-CM | POA: Diagnosis not present

## 2018-08-11 DIAGNOSIS — J432 Centrilobular emphysema: Secondary | ICD-10-CM | POA: Diagnosis not present

## 2018-08-11 DIAGNOSIS — J90 Pleural effusion, not elsewhere classified: Secondary | ICD-10-CM | POA: Insufficient documentation

## 2018-08-14 ENCOUNTER — Telehealth: Payer: Self-pay

## 2018-08-14 NOTE — Telephone Encounter (Signed)
   [facility] Pre-operative Risk Assessment    Request for surgical clearance:  1. What type of surgery is being performed Embolization of brain aneurysm    2. When is this surgery scheduled? 09/10/18   3. What type of clearance is required (medical clearance vs. Pharmacy clearance to hold med vs. Both)?  both  4. Are there any medications that need to be held prior to surgery and how long?    5. Practice name and name of physician performing surgery?  [facility] radiology/ Dr Estanislado Pandy   6. What is your office phone number  [phone]   7.   What is your office fax number [phone]  8.   Anesthesia type (None, local, MAC, general) ?    Legrand Como  Madhuri Vacca 08/14/2018, 3:01 PM  _________________________________________________________________   (provider comments below)

## 2018-08-15 NOTE — Telephone Encounter (Signed)
   Primary Cardiologist:Henry Nicholes Stairs III, MD  Chart reviewed as part of pre-operative protocol coverage. Because of Betty [name]'s past medical history and time since last visit, he/she will require a follow-up visit in order to better assess preoperative cardiovascular risk.  Pre-op covering staff: - Please let Dr. Estanislado Pandy know pt will see Dr. Caryl Comes on the 22nd of OCT and we can clear after that visit, which is prior to procedure.   - Please contact requesting surgeon's office via preferred method (i.e, phone, fax) to inform them of need for appointment prior to surgery.  [name], NP  08/15/2018, 8:55 AM

## 2018-08-15 NOTE — Telephone Encounter (Signed)
Called pt re: appt for surgical clearance. Pt stated that she needed to have her son call back.

## 2018-08-18 NOTE — Telephone Encounter (Signed)
Left message for Dr. Arlean Hopping office pt has appt to see Dr. Caryl Comes 08/26/18 which at that time the pt can be seen for surgery clearance. If any questions please call [phone]. I will remove from the Pre Op Call Back Pool.

## 2018-08-26 ENCOUNTER — Ambulatory Visit (INDEPENDENT_AMBULATORY_CARE_PROVIDER_SITE_OTHER): Payer: Medicare Other | Admitting: Internal Medicine

## 2018-08-26 ENCOUNTER — Encounter: Payer: Self-pay | Admitting: Internal Medicine

## 2018-08-26 VITALS — BP 120/72 | HR 75 | Ht 63.5 in | Wt 130.8 lb

## 2018-08-26 DIAGNOSIS — N289 Disorder of kidney and ureter, unspecified: Secondary | ICD-10-CM

## 2018-08-26 DIAGNOSIS — I2510 Atherosclerotic heart disease of native coronary artery without angina pectoris: Secondary | ICD-10-CM

## 2018-08-26 DIAGNOSIS — I447 Left bundle-branch block, unspecified: Secondary | ICD-10-CM

## 2018-08-26 DIAGNOSIS — F09 Unspecified mental disorder due to known physiological condition: Secondary | ICD-10-CM | POA: Diagnosis not present

## 2018-08-26 DIAGNOSIS — F0789 Other personality and behavioral disorders due to known physiological condition: Secondary | ICD-10-CM

## 2018-08-26 DIAGNOSIS — I5022 Chronic systolic (congestive) heart failure: Secondary | ICD-10-CM

## 2018-08-26 DIAGNOSIS — I428 Other cardiomyopathies: Secondary | ICD-10-CM | POA: Diagnosis not present

## 2018-08-26 MED ORDER — ISOSORB DINITRATE-HYDRALAZINE 20-37.5 MG PO TABS: 1.0000 | ORAL_TABLET | Freq: Two times a day (BID) | ORAL | 3 refills | Status: DC

## 2018-08-26 NOTE — Progress Notes (Signed)
Patient Care Team: Josetta Huddle, MD as PCP - General (Internal Medicine) Belva Crome, MD as PCP - Cardiology (Cardiology) Magdalen Spatz, NP as Nurse Practitioner (Nurse Practitioner) Cletis Athens, MD (Inactive) (Gastroenterology) [name], MD as Consulting Physician (Cardiothoracic Surgery)   HPI  Betty [name] is a 72 y.o. female Seen in follow-up for CRT-D implanted for nonischemic/ischemic cardiomyopathy congestive heart failure and left bundle branch block 7/19.   I presented with congestive heart failure earlier 2019   Echocardiogram had demonstrated an ejection fraction of 15%.  She was hospitalized on 3 occasions May and June 2019.  Guideline directed medical therapy was initiated limited by renal insufficiency (beta-blockers and BiDil).  Evaluation is as outlined below With 1 vessel coronary disease and LV dysfunction way out of proportion.  She was found also to have left bundle branch block new from 2016.     DATE TEST EF   4/19 Echo   20 % Inf AK diffuse HK  5/19 Myoview 17% Without fixed defect  5/19 LHC  RCA 90% >>med Rx    She is better according to her son.  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  Denies chest pain or shortness of breath.  No edema.  She is scheduled for aneurysm surgery next month.  Records and Results Reviewed  Past Medical History:  Diagnosis Date  . Acute combined systolic and diastolic (congestive) hrt fail ([place])   . Acute mastitis of right breast 10/2003  . Arthritis    bursitis in right shoulder (05/21/2018)  . 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 Chronic kidney disease    CKD stage 3 04/28/15  . CKD (chronic kidney disease), stage IV ([place])   . 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 Colon polyps   . 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 Dementia ([place]) 07/16/2018  . Family history of  breast cancer   . GERD (gastroesophageal reflux disease)   . Hypercholesterolemia   . Hypertension   . Non-small cell carcinoma of right lung, stage 1 ([place]) 05/17/2015   Stage IB, right upper lobectomy 04/29/15   . NSCL ca dx'd 03/2015   chemo; RL Lobectomy  . NSTEMI (non-ST elevated myocardial infarction) ([place]) 03/24/2018  . Presence of permanent cardiac pacemaker 05/21/2018  . Pure hypercholesterolemia   . Sciatica of right side   . Seasonal allergic rhinitis   . Tobacco dependence   . Tubular adenoma of colon   . Vitamin D deficiency     Past Surgical History:  Procedure Laterality Date  . BIV PACEMAKER INSERTION CRT-P N/A 05/21/2018   Procedure: BIV PACEMAKER INSERTION CRT-P;  Surgeon: Deboraha Sprang, MD;  Location: [place] [facility];  Service: Cardiovascular;  Laterality: N/A;  . BREAST SURGERY     small mass removed from left breast--benign  . CRYO INTERCOSTAL NERVE BLOCK Right 04/29/2015   Procedure: CRYO INTERCOSTAL NERVE BLOCK;  Surgeon: [name], MD;  Location: [facility];  Service: Thoracic;  Laterality: Right;  . INSERT / REPLACE / REMOVE PACEMAKER  05/21/2018  . LOBECTOMY Right 04/29/2015   Procedure: RIGHT LOWER LUNG LOBECTOMY ;  Surgeon: [name], MD;  Location: [facility] [place];  Service: Thoracic;  Laterality: Right;  . LYMPH NODE DISSECTION Right 04/29/2015   Procedure: RIGHT LUNG LYMPH NODE DISSECTION;  Surgeon: [name], MD;  Location[facility];  Service: Thoracic;  Laterality: Right;  . RIGHT/LEFT HEART CATH AND CORONARY ANGIOGRAPHY N/A 04/24/2018   Procedure: RIGHT/LEFT HEART CATH AND CORONARY ANGIOGRAPHY;  Surgeon: [name], MD;  Location: [place] [facility];  Service: Cardiovascular;  Laterality: N/A;  . VAGINAL DELIVERY     51 yrs ago  . VIDEO ASSISTED THORACOSCOPY (VATS)/ LOBECTOMY Right 04/29/2015   Procedure: RIGHT VIDEO ASSISTED THORACOSCOPY (VATS) WEDGE RESECTION/ RIGHT LOWER LOBECTOMY, CRYO-ANALGESIA OF INTERCOSTAL NERVES;  Surgeon:  [name], MD;  Location: [facility];  Service: Thoracic;  Laterality: Right;    Current Meds  Medication Sig  . acetaminophen (TYLENOL) 325 MG tablet Take 650 mg by mouth every 6 (six) hours as needed for headache (pain). Reported on 03/27/2016  . aspirin EC 81 MG tablet Take 81 mg by mouth at bedtime.  Marland Kitchen atorvastatin (LIPITOR) 40 MG tablet Take 1 tablet (40 mg total) by mouth daily at 6 PM.  . Cholecalciferol (VITAMIN D3 PO) Take 1 capsule by mouth at bedtime.  . donepezil (ARICEPT) 5 MG tablet Take 1 tablet (5 mg total) by mouth at bedtime.  . furosemide (LASIX) 40 MG tablet Take 20 mg by mouth daily.   . isosorbide-hydrALAZINE (BIDIL) 20-37.5 MG tablet Take 1 tablet by mouth 2 (two) times daily.  . metoprolol succinate (TOPROL-XL) 100 MG 24 hr tablet Take 1 tablet (100 mg total) by mouth at bedtime. Take with or immediately following a meal.  . Multiple Vitamin (MULTIVITAMIN WITH MINERALS) TABS tablet Take 0.5 tablets by mouth at bedtime.  . nitroGLYCERIN (NITROSTAT) 0.4 MG SL tablet Place 1 tablet (0.4 mg total) under the tongue every 5 (five) minutes x 3 doses as needed for chest pain.  . [DISCONTINUED] isosorbide-hydrALAZINE (BIDIL) 20-37.5 MG tablet Take 1 tablet by mouth 2 (two) times daily.    Allergies  Allergen Reactions  . Sulfa Antibiotics Itching      Review of Systems negative except from HPI and PMH  Physical Exam BP 120/72   Pulse 75   Ht 5' 3.5" (1.613 m)   Wt 130 lb 12.8 oz (59.3 kg)   BMI 22.81 kg/m  Well developed and well nourished in no acute distress HENT normal E scleral and icterus clear Neck Supple JVP flat; carotids brisk and full Clear to ausculation Device pocket well healed; without hematoma or erythema.  There is no tethering  Regular rate and rhythm, no murmurs gallops or rub Soft with active bowel sounds No clubbing cyanosis  Edema Alert and oriented, grossly normal motor and sensory function Skin Warm and Dry  ECG P-synchronous/  AV  pacing uprigt Lead 1 and rS lead V1    Assessment and Plan:  Nonischemic cardiomyopathy  Coronary artery disease-single-vessel (RCA)  Congestive heart failure-chronic-systolic-class IIb-III 80  Left bundle branch block greater than 150 ms  Renal insufficiency grade 4  Dementia-mild  Non-small cell lung cancer status post lobectomy 2016 without known recurrence   She is functionally improved.  Continue current medications.  Device reprogrammed to maximize longevity and the pulse change from 2-can>>> 3-can    X-ray was personally reviewed demonstrating posterior location.  She is euvolemic. Virl Axe

## 2018-08-26 NOTE — Patient Instructions (Signed)
Medication Instructions:  Your physician recommends that you continue on your current medications as directed. Please refer to the Current Medication list given to you today.  Labwork: None ordered.  Testing/Procedures: None ordered.  Follow-Up: Your physician recommends that you schedule a follow-up appointment in:   9 months with Dr Caryl Comes  Remote monitoring is used to monitor your Pacemaker of ICD from home. This monitoring reduces the number of office visits required to check your device to one time per year. It allows [place] to keep an eye on the functioning of your device to ensure it is working properly. You are scheduled for a device check from home on 11/26/2017. You may send your transmission at any time that day. If you have a wireless device, the transmission will be sent automatically. After your physician reviews your transmission, you will receive a postcard with your next transmission date.   Any Other Special Instructions Will Be Listed Below (If Applicable).     If you need a refill on your cardiac medications before your next appointment, please call your pharmacy.

## 2018-08-27 LAB — CUP PACEART INCLINIC DEVICE CHECK
Battery Remaining Longevity: 117 mo
Battery Voltage: 3.08 V
Brady Statistic AP VP Percent: 0.6 %
Brady Statistic AS VS Percent: 1.4 %
Brady Statistic RA Percent Paced: 0.64 %
Date Time Interrogation Session: 20191022183053
Implantable Lead Implant Date: 20190717
Implantable Lead Implant Date: 20190717
Implantable Lead Location: 753858
Implantable Lead Location: 753860
Implantable Lead Model: 5076
Implantable Lead Model: 5076
Lead Channel Impedance Value: 1178 Ohm
Lead Channel Impedance Value: 1254 Ohm
Lead Channel Impedance Value: 1273 Ohm
Lead Channel Impedance Value: 1292 Ohm
Lead Channel Impedance Value: 399 Ohm
Lead Channel Impedance Value: 684 Ohm
Lead Channel Pacing Threshold Amplitude: 0.75 V
Lead Channel Pacing Threshold Amplitude: 1.75 V
Lead Channel Pacing Threshold Pulse Width: 0.4 ms
Lead Channel Pacing Threshold Pulse Width: 0.6 ms
Lead Channel Sensing Intrinsic Amplitude: 12.25 mV
Lead Channel Sensing Intrinsic Amplitude: 2.5 mV
Lead Channel Setting Pacing Amplitude: 2 V
Lead Channel Setting Pacing Amplitude: 2.5 V
Lead Channel Setting Pacing Pulse Width: 0.4 ms
MDC IDC LEAD IMPLANT DT: 20190717
MDC IDC LEAD LOCATION: 753859
MDC IDC MSMT LEADCHNL LV IMPEDANCE VALUE: 1216 Ohm
MDC IDC MSMT LEADCHNL LV IMPEDANCE VALUE: 1292 Ohm
MDC IDC MSMT LEADCHNL LV IMPEDANCE VALUE: 741 Ohm
MDC IDC MSMT LEADCHNL RA PACING THRESHOLD AMPLITUDE: 0.75 V
MDC IDC MSMT LEADCHNL RV IMPEDANCE VALUE: 399 Ohm
MDC IDC MSMT LEADCHNL RV IMPEDANCE VALUE: 475 Ohm
MDC IDC MSMT LEADCHNL RV PACING THRESHOLD PULSEWIDTH: 0.4 ms
MDC IDC PG IMPLANT DT: 20190717
MDC IDC SET LEADCHNL LV PACING AMPLITUDE: 3 V
MDC IDC SET LEADCHNL LV PACING PULSEWIDTH: 0.6 ms
MDC IDC SET LEADCHNL RV SENSING SENSITIVITY: 1.2 mV
MDC IDC STAT BRADY AP VS PERCENT: 0.03 %
MDC IDC STAT BRADY AS VP PERCENT: 97.96 %
MDC IDC STAT BRADY RV PERCENT PACED: 0.56 %

## 2018-08-28 ENCOUNTER — Encounter: Payer: Self-pay | Admitting: Interventional Cardiology

## 2018-09-04 ENCOUNTER — Encounter: Payer: Self-pay | Admitting: Interventional Cardiology

## 2018-09-05 ENCOUNTER — Other Ambulatory Visit (HOSPITAL_COMMUNITY): Payer: Self-pay | Admitting: Interventional Radiology

## 2018-09-05 DIAGNOSIS — I671 Cerebral aneurysm, nonruptured: Secondary | ICD-10-CM

## 2018-09-08 ENCOUNTER — Ambulatory Visit (HOSPITAL_COMMUNITY)
Admission: RE | Admit: 2018-09-08 | Discharge: 2018-09-08 | Disposition: A | Discharge status: Home / Self Care | Payer: Medicare Other | Source: Ambulatory Visit | Attending: Interventional Radiology | Admitting: Interventional Radiology

## 2018-09-08 DIAGNOSIS — I671 Cerebral aneurysm, nonruptured: Secondary | ICD-10-CM | POA: Diagnosis not present

## 2018-09-08 NOTE — Telephone Encounter (Signed)
Need to verify if she is cleared for her procedure or not. The procedure is supposed to be the coming Wednesday. Caryl Pina can be reached at [phone].

## 2018-09-08 NOTE — Progress Notes (Signed)
Chief Complaint: The patient is seen in follow up today to discuss clearance for procedure  History of present illness: Betty [name] is a 72 y.o. female with a past medical history of hypertension, MI 1941, diastolic and systolic HF s/p pacemaker 2019, hypercholesterolemia, Alzheimer's dementia, COPD, non-small cell lung cancer, GERD, tubular adenoma of colon, CKD stage IV, vitamin D deficiency, and prior tobacco use. She was recently found to have an occluded left vertebral artery, 49m right anterior communicating artery aneurysm.  She met with Dr. DEstanislado Pandy9/20/19 to discuss management of these findings.  It was also noted at the time of her appointment that she has advanced renal disease as well as a history of cardiac disease with acute cardiac events within the past year.  She presents in follow-up today to discuss her renal and cardiac clearance.   Past Medical History:  Diagnosis Date  . Acute combined systolic and diastolic (congestive) hrt fail ([place]   . Acute mastitis of right breast 10/2003  . Arthritis    bursitis in right shoulder (05/21/2018)  . 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 Chronic kidney disease    CKD stage 3 04/28/15  . CKD (chronic kidney disease), stage IV ([place]   . 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 Colon polyps   . 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 Dementia ([place] 07/16/2018  . Family history of breast cancer   . GERD (gastroesophageal reflux disease)   . Hypercholesterolemia   . Hypertension   . Non-small cell carcinoma of right lung, stage 1 ([place] 05/17/2015   Stage IB, right upper lobectomy 04/29/15   . NSCL ca dx'd 03/2015   chemo; RL Lobectomy  . NSTEMI (non-ST elevated myocardial infarction) ([place] 03/24/2018  . Presence of permanent cardiac pacemaker 05/21/2018  . Pure hypercholesterolemia   . Sciatica of right side   . Seasonal allergic rhinitis   .  Tobacco dependence   . Tubular adenoma of colon   . Vitamin D deficiency     Past Surgical History:  Procedure Laterality Date  . BIV PACEMAKER INSERTION CRT-P N/A 05/21/2018   Procedure: BIV PACEMAKER INSERTION CRT-P;  Surgeon: KDeboraha Sprang MD;  [facility];  Service: Cardiovascular;  Laterality: N/A;  . BREAST SURGERY     small mass removed from left breast--benign  . CRYO INTERCOSTAL NERVE BLOCK Right 04/29/2015   Procedure: CRYO INTERCOSTAL NERVE BLOCK;  Surgeon: SMelrose Nakayama MD;  [place] [facility]  Service: Thoracic;  Laterality: Right;  . INSERT / REPLACE / REMOVE PACEMAKER  05/21/2018  . LOBECTOMY Right 04/29/2015   Procedure: RIGHT LOWER LUNG LOBECTOMY ;  Surgeon: SMelrose Nakayama MD;  [facility]  Service: Thoracic;  Laterality: Right;  . LYMPH NODE DISSECTION Right 04/29/2015   Procedure: RIGHT LUNG LYMPH NODE DISSECTION;  Surgeon: SMelrose Nakayama MD;  [place] [facility]  Service: Thoracic;  Laterality: Right;  . RIGHT/LEFT HEART CATH AND CORONARY ANGIOGRAPHY N/A 04/24/2018   Procedure: RIGHT/LEFT HEART CATH AND CORONARY ANGIOGRAPHY;  Surgeon: BLorretta Harp MD;  [facility];  Service: Cardiovascular;  Laterality: N/A;  . VAGINAL DELIVERY     51 yrs ago  . VIDEO ASSISTED THORACOSCOPY (VATS)/ LOBECTOMY Right 04/29/2015   Procedure: RIGHT VIDEO ASSISTED THORACOSCOPY (VATS) WEDGE RESECTION/ RIGHT LOWER LOBECTOMY, CRYO-ANALGESIA OF INTERCOSTAL NERVES;  Surgeon: SMelrose Nakayama MD;  [place] [facility]  Service:  Thoracic;  Laterality: Right;    Allergies: Sulfa antibiotics  Medications: Prior to Admission medications   Medication Sig Start Date End Date Taking? Authorizing Provider  acetaminophen (TYLENOL) 325 MG tablet Take 650 mg by mouth every 6 (six) hours as needed for headache (pain).     [provider]  aspirin EC 81 MG tablet Take 81 mg by mouth at bedtime.    [provider]  atorvastatin  (LIPITOR) 40 MG tablet Take 1 tablet (40 mg total) by mouth daily at 6 PM. 04/02/18   Kroeger, Lorelee Cover., PA-C  Cholecalciferol (VITAMIN D) 2000 units tablet Take 2,000 Units by mouth at bedtime.    [provider]  donepezil (ARICEPT) 5 MG tablet Take 1 tablet (5 mg total) by mouth at bedtime. Patient not taking: Reported on 09/08/2018 07/16/18   Kathrynn Ducking, MD  furosemide (LASIX) 40 MG tablet Take 20 mg by mouth daily.     [provider]  isosorbide-hydrALAZINE (BIDIL) 20-37.5 MG tablet Take 1 tablet by mouth 2 (two) times daily. 08/26/18   Deboraha Sprang, MD  metoprolol succinate (TOPROL-XL) 100 MG 24 hr tablet Take 1 tablet (100 mg total) by mouth at bedtime. Take with or immediately following a meal. 04/02/18   Kroeger, Lorelee Cover., PA-C  Multiple Vitamin (MULTIVITAMIN WITH MINERALS) TABS tablet Take 0.5 tablets by mouth at bedtime.    [provider]  nitroGLYCERIN (NITROSTAT) 0.4 MG SL tablet Place 1 tablet (0.4 mg total) under the tongue every 5 (five) minutes x 3 doses as needed for chest pain. 04/26/18   Daune Perch, NP     Family History  Problem Relation Age of Onset  . Cancer Father   . Cancer Sister   . Heart attack Brother   . Dementia Brother   . Other Son        bicuspid aortic valve  . Heart attack Brother   . Cancer Sister     Social History   Socioeconomic History  . Marital status: Single    Spouse name: Not on file  . Number of children: 1  . Years of education: 26  . Highest education level: Not on file  Occupational History  . Occupation: Retired  Scientific laboratory technician  . Financial resource strain: Not hard at all  . Food insecurity:    Worry: Patient refused    Inability: Patient refused  . Transportation needs:    Medical: Patient refused    Non-medical: Patient refused  Tobacco Use  . Smoking status: Former Smoker    Packs/day: 0.75    Years: 47.00    Pack years: 35.25    Types: Cigarettes    Last attempt to quit:  04/29/2015    Years since quitting: 3.3  . Smokeless tobacco: Never Used  Substance and Sexual Activity  . Alcohol use: Yes    Comment: 05/21/2018 "maybe 6 drinks/year"  . Drug use: Never  . Sexual activity: Not on file  Lifestyle  . Physical activity:    Days per week: Patient refused    Minutes per session: Patient refused  . Stress: Only a little  Relationships  . Social connections:    Talks on phone: Patient refused    Gets together: Patient refused    Attends religious service: Patient refused    Active member of club or organization: Patient refused    Attends meetings of clubs or organizations: Patient refused    Relationship status: Patient refused  Other Topics Concern  .  Not on file  Social History Narrative   Lives alone   Caffeine use: 1 cup coffee per day   Right handed     Vital Signs: There were no vitals taken for this visit.  Physical Exam  Nursing note and vitals reviewed. NAD, alert Neuro: no focal deficits noted.  Clear speech. No gait difficulties   Imaging: No results found.  Labs:  CBC: Recent Labs    04/23/18 0048 04/25/18 0732 04/26/18 0443 08/04/18 0916  WBC 8.0 7.4 7.7 7.0  HGB 10.6* 11.4* 10.9* 12.1  HCT 33.1* 35.0* 33.2* 36.1  PLT 264 293 284 282    COAGS: Recent Labs    03/25/18 0216  INR 1.17    BMP: Recent Labs    05/16/18 1301 05/20/18 1144 06/02/18 1505 08/04/18 0916  NA 139 142 143 143  K 3.8 3.6 4.0 3.7  CL 100 101 102 110  CO2 _0 21*  GLUCOSE 95 114* 92 94  BUN 33* 31* 28* 29*  CALCIUM 9.5 9.9 10.4* 9.8  CREATININE 3.18* 3.20* 2.34* 2.61*  GFRNONAA 14* 14* 20* 17*  GFRAA 16* 16* 23* 20*    LIVER FUNCTION TESTS: Recent Labs    02/04/18 0904 08/04/18 0916  BILITOT 0.5 0.6  AST 24 33  ALT 15 46*  ALKPHOS 108 139*  PROT 7.3 7.5  ALBUMIN 3.8 3.9    Assessment: ACOM aneurysm Patient presented to [facility] clinic today to discuss cardiac and renal clearance for procedure.  Nephrology has  cleared the patient for the procedure with notation of moderate risk for renal injury and worsening of her kidney function.  Dr. Jimmy Footman recommends the patient receive hydration 8hrs prior to the procedure with limited use of dye as able.  Patient was seen by cardiology, however clearance/risk not yet received. Expect patient will be high risk for procedure based on NSTEMI in May of this year, 90% RCA stenosis, EF of 15% with LBBB s/p BiV pacemaker in July of this year.  This was discussed with the patient who indicates she would like to move forward with treatment of her aneurysm despite risk.  Dr. Estanislado Pandy recommends waiting for official recommendations from cardiology prior to considering moving forward.  Patient's son was present for discussion today, however patient serves as her own decision maker at this time.  Patient and son are agreeable to postponing procedure at this time.  Procedure will be rescheduled once all risk has been considered and adequately discussed with the patient.   Signed: Docia Barrier, PA 09/08/2018, 1:29 PM   I spent a total of 30 Minutes in face to face in clinical consultation, greater than 50% of which was counseling/coordinating care for right ACOM aneurysm.

## 2018-09-08 NOTE — Telephone Encounter (Signed)
Can we get input from either Dr. Tamala Julian or Dr. Caryl Comes (out of office until 11/8) on this patient's surgical risk. For embolization of brain aneurysm.   Primary Cardiologist: Sinclair Grooms, MD. Seen by Dr. Caryl Comes on 08/26/18 but surgical clearance not addressed.   Betty [name] has a recent hx (03/2018) of NSTEMI with 90% RCA stenosis with no intervention (04/2018). She has non-ischemic cardiomyopathy with EF 15% with LBBB and now has a BiV pacemaker (05/2018). She was last seen on 08/26/18 by Dr. Caryl Comes at which time she was doing well with improvement in her heart failure symptoms.   According to the Revised Cardiac Risk Index she is a Class IV risk with an 11% risk of major cardiac event perioperatively. CAD, CHF and renal failure with SCr >2 all increase her risk. Functionally she is able to walk for a mile.   Please route response back to CV DIV PREOP

## 2018-09-08 NOTE — Telephone Encounter (Signed)
   Primary Cardiologist: Sinclair Grooms, MD  Chart reviewed as part of pre-operative protocol coverage. Patient was contacted 09/08/2018 in reference to pre-operative risk assessment for pending surgery as outlined below. Betty [name] has a recent hx (03/2018) of NSTEMI with 90% RCA stenosis with no intervention (04/2018). She has non-ischemic cardiomyopathy with EF 15% with LBBB and now has a BiV pacemaker (05/2018). She was last seen on 08/26/18 by Dr. Caryl Comes at which time she was doing well with improvement in her heart failure symptoms.  According to the Revised Cardiac Risk Index she is a Class IV risk with an 11% risk of major cardiac event perioperatively. CAD, CHF and renal failure with SCr >2 all increase her risk. Functionally she is able to walk for a mile.    I discussed this pt with Dr. Tamala Julian who feels that she is at acceptable risk for this catheter based procedure and can proceed.   Therefore, based on ACC/AHA guidelines, the patient would be at acceptable risk for the planned procedure without further cardiovascular testing.   I will route this recommendation to the requesting party via Epic fax function and remove from pre-op pool.  Please call with questions.  Daune Perch, NP 09/08/2018, 5:26 PM

## 2018-09-10 ENCOUNTER — Encounter (HOSPITAL_COMMUNITY): Payer: Self-pay

## 2018-09-10 ENCOUNTER — Ambulatory Visit (HOSPITAL_COMMUNITY)
Admission: RE | Admit: 2018-09-10 | Discharge: 2018-09-10 | Disposition: A | Discharge status: Home / Self Care | Payer: Medicare Other | Source: Ambulatory Visit | Attending: Interventional Radiology | Admitting: Interventional Radiology

## 2018-09-10 ENCOUNTER — Encounter (HOSPITAL_COMMUNITY): Admission: RE | Payer: Self-pay | Source: Ambulatory Visit

## 2018-09-10 ENCOUNTER — Ambulatory Visit (HOSPITAL_COMMUNITY)
Admission: RE | Admit: 2018-09-10 | Payer: Medicare Other | Source: Ambulatory Visit | Admitting: Interventional Radiology

## 2018-09-10 SURGERY — RADIOLOGY WITH ANESTHESIA
Anesthesia: General

## 2018-09-28 NOTE — Progress Notes (Signed)
Cardiology Office Note:    Date:  09/29/2018   ID:  Betty [name], DOB 05/21/1946, MRN [id number]  PCP:  Josetta Huddle, MD  Cardiologist:  Sinclair Grooms, MD   Referring MD: Josetta Huddle, MD   Chief Complaint  Patient presents with  . Congestive Heart Failure    History of Present Illness:    Betty [name] is a 72 y.o. female with a hx of non-ischemic CM, chronic systolic heart failure, CKD 4, dementia, and CRT for LV preservation.  Betty [name] is accompanied by Chrissie Noa, her son.  Since last being seen, she has undergone CRT by Dr. Caryl Comes.  She denies orthopnea, PND, but does have dyspnea on exertion which could be related to COPD.  She denies angina and has been demonstrated to have nonobstructive CAD.  In the course of evaluation for dementia/decreased memory, she was found to have a cerebral aneurysm and was referred to Dr. Lemar Lofty for consideration of endovascular therapy.  She was seen in consultation by Dr. Elon Alas. Willis after coincidental finding of a cerebral aneurysm.  She seems to be sleeping better.  Her memory is still an issue.  Interaction in the office today is improved.  Her son has not started the low-dose donepezil (Aricept) because he was concerned about potential cardiac complications.  She would not follow a prudent diet related to her congestive heart failure diagnosis.  Past Medical History:  Diagnosis Date  . Acute combined systolic and diastolic (congestive) hrt fail ([place])   . Acute mastitis of right breast 10/2003  . Arthritis    bursitis in right shoulder (05/21/2018)  . 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 Chronic kidney disease    CKD stage 3 04/28/15  . CKD (chronic kidney disease), stage IV ([place])   . 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 Colon polyps   . 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 Dementia ([place]) 07/16/2018  . Family history of breast cancer    . GERD (gastroesophageal reflux disease)   . Hypercholesterolemia   . Hypertension   . Non-small cell carcinoma of right lung, stage 1 ([place]) 05/17/2015   Stage IB, right upper lobectomy 04/29/15   . NSCL ca dx'd 03/2015   chemo; RL Lobectomy  . NSTEMI (non-ST elevated myocardial infarction) ([place]) 03/24/2018  . Presence of permanent cardiac pacemaker 05/21/2018  . Pure hypercholesterolemia   . Sciatica of right side   . Seasonal allergic rhinitis   . Tobacco dependence   . Tubular adenoma of colon   . Vitamin D deficiency     Past Surgical History:  Procedure Laterality Date  . BIV PACEMAKER INSERTION CRT-P N/A 05/21/2018   Procedure: BIV PACEMAKER INSERTION CRT-P;  Surgeon: Deboraha Sprang, MD;  Location: [facility];  Service: Cardiovascular;  Laterality: N/A;  . BREAST SURGERY     small mass removed from left breast--benign  . CRYO INTERCOSTAL NERVE BLOCK Right 04/29/2015   Procedure: CRYO INTERCOSTAL NERVE BLOCK;  Surgeon: [name], MD;  Location: [place];  Service: Thoracic;  Laterality: Right;  . INSERT / REPLACE / REMOVE PACEMAKER  05/21/2018  . LOBECTOMY Right 04/29/2015   Procedure: RIGHT LOWER LUNG LOBECTOMY ;  Surgeon: [name], MD;  Location: [place];  Service: Thoracic;  Laterality: Right;  . LYMPH NODE DISSECTION Right 04/29/2015   Procedure: RIGHT LUNG LYMPH NODE DISSECTION;  Surgeon: [name], MD;  [facility];  Service: Thoracic;  Laterality: Right;  . RIGHT/LEFT HEART CATH AND CORONARY ANGIOGRAPHY N/A 04/24/2018   Procedure: RIGHT/LEFT HEART CATH AND CORONARY ANGIOGRAPHY;  Surgeon: [name], MD;  Location: [facility];  Service: Cardiovascular;  Laterality: N/A;  . VAGINAL DELIVERY     51 yrs ago  . VIDEO ASSISTED THORACOSCOPY (VATS)/ LOBECTOMY Right 04/29/2015   Procedure: RIGHT VIDEO ASSISTED THORACOSCOPY (VATS) WEDGE RESECTION/ RIGHT LOWER LOBECTOMY, CRYO-ANALGESIA OF INTERCOSTAL NERVES;  Surgeon: [name], MD;  [facility];  Service: Thoracic;  Laterality: Right;    Current Medications: Current Meds  Medication Sig  . acetaminophen (TYLENOL) 325 MG tablet Take 650 mg by mouth every 6 (six) hours as needed for headache (pain).   Marland Kitchen aspirin EC 81 MG tablet Take 81 mg by mouth at bedtime.  Marland Kitchen atorvastatin (LIPITOR) 40 MG tablet Take 1 tablet (40 mg total) by mouth daily at 6 PM.  . Cholecalciferol (VITAMIN D) 2000 units tablet Take 2,000 Units by mouth at bedtime.  . donepezil (ARICEPT) 5 MG tablet Take 1 tablet (5 mg total) by mouth at bedtime.  . furosemide (LASIX) 40 MG tablet Take 20 mg by mouth daily.   . isosorbide-hydrALAZINE (BIDIL) 20-37.5 MG tablet Take 1 tablet by mouth 2 (two) times daily.  . metoprolol succinate (TOPROL-XL) 100 MG 24 hr tablet Take 1 tablet (100 mg total) by mouth at bedtime. Take with or immediately following a meal.  . Multiple Vitamin (MULTIVITAMIN WITH MINERALS) TABS tablet Take 0.5 tablets by mouth at bedtime.  . nitroGLYCERIN (NITROSTAT) 0.4 MG SL tablet Place 1 tablet (0.4 mg total) under the tongue every 5 (five) minutes x 3 doses as needed for chest pain.     Allergies:   Sulfa antibiotics   Social History   Socioeconomic History  . Marital status: Single    Spouse name: Not on file  . Number of children: 1  . Years of education: 88  . Highest education level: Not on file  Occupational History  . Occupation: Retired  Scientific laboratory technician  . Financial resource strain: Not hard at all  . Food insecurity:    Worry: Patient refused    Inability: Patient refused  . Transportation needs:    Medical: Patient refused    Non-medical: Patient refused  Tobacco Use  . Smoking status: Former Smoker    Packs/day: 0.75    Years: 47.00    Pack years: 35.25    Types: Cigarettes    Last attempt to quit: 04/29/2015    Years since quitting: 3.4  . Smokeless tobacco: Never Used  Substance and Sexual Activity  . Alcohol use: Yes    Comment: 05/21/2018  "maybe 6 drinks/year"  . Drug use: Never  . Sexual activity: Not on file  Lifestyle  . Physical activity:    Days per week: Patient refused    Minutes per session: Patient refused  . Stress: Only a little  Relationships  . Social connections:    Talks on phone: Patient refused    Gets together: Patient refused    Attends religious service: Patient refused    Active member of club or organization: Patient refused    Attends meetings of clubs or organizations: Patient refused    Relationship status: Patient refused  Other Topics Concern  . Not on file  Social History Narrative   Lives alone   Caffeine use: 1 cup coffee per day   Right handed  Family History: The patient's family history includes Cancer in her father, sister, and sister; Dementia in her brother; Heart attack in her brother and brother; Other in her son.  ROS:   Please see the history of present illness.    Frustration with confusion and decreased memory.  All other systems reviewed and are negative.  EKGs/Labs/Other Studies Reviewed:    The following studies were reviewed today: No new cardiovascular imaging has been performe  EKG:  EKG is not ordered today.  The ekg performed on 08/26/2018 revealed atrial tracking with ventricular pacing.  Recent Labs: 04/22/2018: B Natriuretic Peptide 3,923.9 05/15/2018: NT-Pro BNP 26,811 08/04/2018: ALT 46; BUN 29; Creatinine 2.61; Hemoglobin 12.1; Platelet Count 282; Potassium 3.7; Sodium 143  Recent Lipid Panel    Component Value Date/Time   CHOL 175 03/25/2018 0216   TRIG 103 03/25/2018 0216   HDL 39 (L) 03/25/2018 0216   CHOLHDL 4.5 03/25/2018 0216   VLDL 21 03/25/2018 0216   LDLCALC 115 (H) 03/25/2018 0216    Physical Exam:    VS:  BP 122/72   Pulse 70   Ht 5' 3.5" (1.613 m)   Wt 129 lb (58.5 kg)   SpO2 98%   BMI 22.49 kg/m     Wt Readings from Last 3 Encounters:  09/29/18 129 lb (58.5 kg)  08/26/18 130 lb 12.8 oz (59.3 kg)  08/06/18 128 lb 6.4  oz (58.2 kg)     GEN:  Well nourished, well developed in no acute distress HEENT: Normal NECK: No JVD. LYMPHATICS: No lymphadenopathy CARDIAC: RRR, 1/6 systolic murmur, no gallop, no edema. VASCULAR: 2+ bilateral radial and carotid pulses.  N bruits. RESPIRATORY:  Clear to auscultation without rales, wheezing or rhonchi  ABDOMEN: Soft, non-tender, non-distended, No pulsatile mass, MUSCULOSKELETAL: No deformity  SKIN: Warm and dry NEUROLOGIC:  Alert and oriented x 3 PSYCHIATRIC:  Normal affect   ASSESSMENT:    1. Chronic systolic heart failure ([place])   2. CKD (chronic kidney disease), stage IV ([place])   3. Cognitive and neurobehavioral dysfunction   4. Cerebral aneurysm, nonruptured   5. S/P lobectomy of lung    PLAN:    In order of problems listed above:  1. Current therapy for systolic dysfunction includes long-acting nitrates and hydralazine in the form of BiDil because of contraindications for ARB/ACE therapy due to stage IV CKD.  She is also on beta-blocker therapy with metoprolol succinate.  Furosemide 20 mg/day is keeping her euvolemic.  She is also undergone CRT to help improve LV systolic function.  Plan will be to repeat an echo in 2 to 4 months prior to the office visit with me in late spring. 2. Needs to be followed closely by primary physician, Dr. [name].  Last creatinine was done in September and was 2.6 with a potassium of 3.7.  Again we are not using menorrhalgia corticoid receptor antagonist or ARB therapy. 3. Donepezil will be started. 4. Will inquire from both Dr. Margette Fast and Dr. Patrecia Pour about management of the anterior communicating 8 mm aneurysm and management strategy.  Guideline directed therapy for left ventricular systolic dysfunction: Angiotensin receptor-neprilysin inhibitor (ARNI)-Entresto; beta-blocker therapy - carvedilol or metoprolol succinate; mineralocorticoid receptor antagonist (MRA) therapy -spironolactone or eplerenone.  These  therapies have been shown to improve clinical outcomes including reduction of rehospitalization survival, and acute heart failure.  Greater than 50% of the time during this office visit was spent in education, counseling, and coordination of care related to underlying disease process  and testing as outlined.   Medication Adjustments/Labs and Tests Ordered: Current medicines are reviewed at length with the patient today.  Concerns regarding medicines are outlined above.  Orders Placed This Encounter  Procedures  . ECHOCARDIOGRAM COMPLETE   No orders of the defined types were placed in this encounter.   Patient Instructions  Medication Instructions:  Your physician recommends that you continue on your current medications as directed. Please refer to the Current Medication list given to you today.  If you need a refill on your cardiac medications before your next appointment, please call your pharmacy.   Lab work: None ordered If you have labs (blood work) drawn today and your tests are completely normal, you will receive your results only by: Marland Kitchen MyChart Message (if you have MyChart) OR . A paper copy in the mail If you have any lab test that is abnormal or we need to change your treatment, we will call you to review the results.  Testing/Procedures: Your physician has requested that you have an echocardiogram in 4 months. Echocardiography is a painless test that uses sound waves to create images of your heart. It provides your doctor with information about the size and shape of your heart and how well your heart's chambers and valves are working. This procedure takes approximately one hour. There are no restrictions for this procedure.    Follow-Up: At [facility], you and your health needs are our priority.  As part of our continuing mission to provide you with exceptional heart care, we have created designated Provider Care Teams.  These Care Teams include your primary Cardiologist  (physician) and Advanced Practice Providers (APPs -  Physician Assistants and Nurse Practitioners) who all work together to provide you with the care you need, when you need it. . You will need a follow up appointment in 5 months.  Please call our office 2 months in advance to schedule this appointment.  You may see Sinclair Grooms, MD or one of the following Advanced Practice Providers on your designated Care Team:   . Truitt Merle, NP . [name], NP . Kathyrn Drown, NP   Any Other Special Instructions Will Be Listed Below (If Applicable).     Signed, Sinclair Grooms, MD  09/29/2018 1:03 PM    [facility]

## 2018-09-29 ENCOUNTER — Encounter: Payer: Self-pay | Admitting: Interventional Cardiology

## 2018-09-29 ENCOUNTER — Ambulatory Visit (INDEPENDENT_AMBULATORY_CARE_PROVIDER_SITE_OTHER): Payer: Medicare Other | Admitting: Interventional Cardiology

## 2018-09-29 ENCOUNTER — Ambulatory Visit: Payer: Self-pay | Admitting: Interventional Cardiology

## 2018-09-29 ENCOUNTER — Telehealth: Payer: Self-pay | Admitting: Neurology

## 2018-09-29 VITALS — BP 122/72 | HR 70 | Ht 63.5 in | Wt 129.0 lb

## 2018-09-29 DIAGNOSIS — Z902 Acquired absence of lung [part of]: Secondary | ICD-10-CM

## 2018-09-29 DIAGNOSIS — N184 Chronic kidney disease, stage 4 (severe): Secondary | ICD-10-CM | POA: Diagnosis not present

## 2018-09-29 DIAGNOSIS — I2510 Atherosclerotic heart disease of native coronary artery without angina pectoris: Secondary | ICD-10-CM

## 2018-09-29 DIAGNOSIS — I5022 Chronic systolic (congestive) heart failure: Secondary | ICD-10-CM | POA: Diagnosis not present

## 2018-09-29 DIAGNOSIS — F0789 Other personality and behavioral disorders due to known physiological condition: Secondary | ICD-10-CM | POA: Diagnosis not present

## 2018-09-29 DIAGNOSIS — I671 Cerebral aneurysm, nonruptured: Secondary | ICD-10-CM

## 2018-09-29 DIAGNOSIS — F09 Unspecified mental disorder due to known physiological condition: Secondary | ICD-10-CM

## 2018-09-29 NOTE — Patient Instructions (Signed)
Medication Instructions:  Your physician recommends that you continue on your current medications as directed. Please refer to the Current Medication list given to you today.  If you need a refill on your cardiac medications before your next appointment, please call your pharmacy.   Lab work: None ordered If you have labs (blood work) drawn today and your tests are completely normal, you will receive your results only by: Marland Kitchen MyChart Message (if you have MyChart) OR . A paper copy in the mail If you have any lab test that is abnormal or we need to change your treatment, we will call you to review the results.  Testing/Procedures: Your physician has requested that you have an echocardiogram in 4 months. Echocardiography is a painless test that uses sound waves to create images of your heart. It provides your doctor with information about the size and shape of your heart and how well your heart's chambers and valves are working. This procedure takes approximately one hour. There are no restrictions for this procedure.    Follow-Up: At [facility], you and your health needs are our priority.  As part of our continuing mission to provide you with exceptional heart care, we have created designated Provider Care Teams.  These Care Teams include your primary Cardiologist (physician) and Advanced Practice Providers (APPs -  Physician Assistants and Nurse Practitioners) who all work together to provide you with the care you need, when you need it. . You will need a follow up appointment in 5 months.  Please call our office 2 months in advance to schedule this appointment.  You may see Sinclair Grooms, MD or one of the following Advanced Practice Providers on your designated Care Team:   . Truitt Merle, NP . [name], NP . Kathyrn Drown, NP   Any Other Special Instructions Will Be Listed Below (If Applicable).

## 2018-09-29 NOTE — Telephone Encounter (Signed)
I called and talk with the patient's husband.  The patient has met with Dr. Estanislado Pandy on 2 occasions, they have discussed the issue with the nephrologist, they have indicated that the angiogram could be done after significant hydration with fluids, but there was some concern over the cardiac function.  The patient was seen by Dr. Tamala Julian today.  The family is willing to go along with the procedure as long as it can be done safely.

## 2018-09-29 NOTE — Telephone Encounter (Signed)
-----   Message from Belva Crome, MD sent at 09/29/2018 12:55 PM EST ----- Regarding: 8 mm anterior circulation aneurysm Gent[place],  Where are we in the process of treating the anterior cerebral communicating aneurysm and Betty [name]?  Her son requested that I get a follow-up concerning the proposed management.  Thanks, Pernell Dupre

## 2018-10-08 ENCOUNTER — Other Ambulatory Visit (HOSPITAL_COMMUNITY): Payer: Self-pay | Admitting: Interventional Radiology

## 2018-10-08 DIAGNOSIS — I729 Aneurysm of unspecified site: Secondary | ICD-10-CM

## 2018-10-15 ENCOUNTER — Ambulatory Visit (HOSPITAL_COMMUNITY): Admission: RE | Admit: 2018-10-15 | Payer: Medicare Other | Source: Ambulatory Visit

## 2018-10-20 DIAGNOSIS — I129 Hypertensive chronic kidney disease with stage 1 through stage 4 chronic kidney disease, or unspecified chronic kidney disease: Secondary | ICD-10-CM | POA: Diagnosis not present

## 2018-10-20 DIAGNOSIS — N184 Chronic kidney disease, stage 4 (severe): Secondary | ICD-10-CM | POA: Diagnosis not present

## 2018-10-20 DIAGNOSIS — N2581 Secondary hyperparathyroidism of renal origin: Secondary | ICD-10-CM | POA: Diagnosis not present

## 2018-10-23 ENCOUNTER — Ambulatory Visit (HOSPITAL_COMMUNITY)
Admission: RE | Admit: 2018-10-23 | Discharge: 2018-10-23 | Disposition: A | Discharge status: Home / Self Care | Payer: Medicare Other | Source: Ambulatory Visit | Attending: Interventional Radiology | Admitting: Interventional Radiology

## 2018-10-23 DIAGNOSIS — I729 Aneurysm of unspecified site: Secondary | ICD-10-CM

## 2018-10-23 DIAGNOSIS — I671 Cerebral aneurysm, nonruptured: Secondary | ICD-10-CM | POA: Diagnosis not present

## 2018-10-23 DIAGNOSIS — N189 Chronic kidney disease, unspecified: Secondary | ICD-10-CM | POA: Diagnosis not present

## 2018-10-23 DIAGNOSIS — Z950 Presence of cardiac pacemaker: Secondary | ICD-10-CM | POA: Diagnosis not present

## 2018-10-23 DIAGNOSIS — I6502 Occlusion and stenosis of left vertebral artery: Secondary | ICD-10-CM | POA: Diagnosis not present

## 2018-10-23 NOTE — Progress Notes (Signed)
Supervising Physician: Luanne Bras  Patient Status:  [name] - Outpatient f/u  Chief Complaint:  Brain aneurysm  HPI:  Betty [name] a 72 y.o.femalewith a past medical history of hypertension, hypercholesterolemia, Alzheimer's dementia, COPD, non-small cell lung cancer, GERD, tubular adenoma of colon, CKD stage IV, vitamin D deficiency, andpriortobacco use.   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    She met with Dr. DEstanislado Pandy9/20/19 to discuss management of these findings.   It was also noted at the time of her appointment that she has advanced renal disease with a baseline creatinine of 2.5.  Also she has recent cardiac history = MI 29147and diastolic and systolic HF s/p pacemaker 2019  She is here again today to discuss risks and benefits of going forward with repair of the aneurysm.  Per Nephrology, she is at increased risk however she could be admitted for gentle hydration overnight to plan for contrast enhanced angiography, however, this could be dangerous secondary to her known heart failure.  She is completely asymptomatic regarding the aneurysm.  She remains mostly independent. She is here with her son today and he makes sure she takes all her medications, but that's pretty much it.  She tells uKoreashe is "not worried at all about the aneurysm and when God is ready for her, he will take her".   Allergies: Sulfa antibiotics  Medications: Prior to Admission medications   Medication Sig Start Date End Date Taking? Authorizing Provider  acetaminophen (TYLENOL) 325 MG tablet Take 650 mg by mouth every 6 (six) hours as needed for headache (pain).     [provider]  aspirin EC 81 MG tablet Take 81 mg by mouth at bedtime.    [provider]  atorvastatin (LIPITOR) 40 MG tablet Take 1 tablet (40 mg total) by mouth daily at 6 PM. 04/02/18   Kroeger, KLorelee Cover,  PA-C  Cholecalciferol (VITAMIN D) 2000 units tablet Take 2,000 Units by mouth at bedtime.    [provider]  donepezil (ARICEPT) 5 MG tablet Take 1 tablet (5 mg total) by mouth at bedtime. 07/16/18   WKathrynn Ducking MD  furosemide (LASIX) 40 MG tablet Take 20 mg by mouth daily.     [provider]  isosorbide-hydrALAZINE (BIDIL) 20-37.5 MG tablet Take 1 tablet by mouth 2 (two) times daily. 08/26/18   KDeboraha Sprang MD  metoprolol succinate (TOPROL-XL) 100 MG 24 hr tablet Take 1 tablet (100 mg total) by mouth at bedtime. Take with or immediately following a meal. 04/02/18   Kroeger, KLorelee Cover, PA-C  Multiple Vitamin (MULTIVITAMIN WITH MINERALS) TABS tablet Take 0.5 tablets by mouth at bedtime.    [provider]  nitroGLYCERIN (NITROSTAT) 0.4 MG SL tablet Place 1 tablet (0.4 mg total) under the tongue every 5 (five) minutes x 3 doses as needed for chest pain. 04/26/18   HDaune Perch NP     Vital Signs: There were no vitals taken for this visit.  Physical Exam Constitutional:      Appearance: Normal appearance.  HENT:     Head: Normocephalic and atraumatic.  Eyes:     Extraocular Movements: Extraocular movements intact.  Neck:     Musculoskeletal: Normal range of motion.  Pulmonary:     Effort: Pulmonary effort is normal.  Abdominal:     General: There is no distension.  Musculoskeletal: Normal range of motion.  Skin:  General: Skin is warm and dry.  Neurological:     General: No focal deficit present.     Mental Status: She is alert and oriented to person, place, and time.  Psychiatric:        Mood and Affect: Mood normal.        Behavior: Behavior normal.        Thought Content: Thought content normal.        Judgment: Judgment normal.     Imaging: No results found.  Labs:  CBC: Recent Labs    04/23/18 0048 04/25/18 0732 04/26/18 0443 08/04/18 0916  WBC 8.0 7.4 7.7 7.0  HGB 10.6* 11.4* 10.9* 12.1  HCT 33.1* 35.0* 33.2* 36.1    PLT 264 293 284 282    COAGS: Recent Labs    03/25/18 0216  INR 1.17    BMP: Recent Labs    05/16/18 1301 05/20/18 1144 06/02/18 1505 08/04/18 0916  NA 139 142 143 143  K 3.8 3.6 4.0 3.7  CL 100 101 102 110  CO2 '20 20 21 '$ 21*  GLUCOSE 95 114* 92 94  BUN 33* 31* 28* 29*  CALCIUM 9.5 9.9 10.4* 9.8  CREATININE 3.18* 3.20* 2.34* 2.61*  GFRNONAA 14* 14* 20* 17*  GFRAA 16* 16* 23* 20*    LIVER FUNCTION TESTS: Recent Labs    02/04/18 0904 08/04/18 0916  BILITOT 0.5 0.6  AST 24 33  ALT 15 46*  ALKPHOS 108 139*  PROT 7.3 7.5  ALBUMIN 3.8 3.9    Assessment and Plan:  Occluded left vertebral artery and an 1m right anterior communicating artery aneurysm = asymptomatic.  CKD with baseline creatinine 2.5 = very high risk for contrast nephropathy.  Heart issues = CHF, MI and pacemaker this year. = She is at risk for fluid overload if we over hydrate her to protect her kidneys. She is also at increased cardiac risk for general anesthesia.  Risks and benefits of cerebral angiogram with intervention were discussed with the patient and her son including, but not limited to bleeding, infection, vascular injury, contrast induced renal failure, stroke or even death.  All of the their questions were answered.  After lengthy discussion, we are all in agreement that the benefit of aneurysm repair does NOT outweigh risks involved.  Will hold off on repair for now unless aneurysm grows.  Will follow aneurysm in 6 months with an MRA (non-contrast).  Electronically Signed: WMurrell Redden PA-C 10/23/2018, 1:36 PM    I spent a total of 35 Minutes at the the patient's bedside AND on the patient's hospital floor or unit, greater than 50% of which was counseling/coordinating care for consideration for aneurysm repair.

## 2018-11-17 ENCOUNTER — Other Ambulatory Visit: Payer: Self-pay | Admitting: Neurology

## 2018-11-26 ENCOUNTER — Ambulatory Visit (INDEPENDENT_AMBULATORY_CARE_PROVIDER_SITE_OTHER): Payer: Medicare Other

## 2018-11-26 DIAGNOSIS — I5022 Chronic systolic (congestive) heart failure: Secondary | ICD-10-CM

## 2018-11-27 NOTE — Progress Notes (Signed)
Remote pacemaker transmission.   

## 2018-11-28 ENCOUNTER — Encounter: Payer: Self-pay | Admitting: Cardiology

## 2018-11-30 LAB — CUP PACEART REMOTE DEVICE CHECK
Battery Remaining Longevity: 109 mo
Brady Statistic AP VS Percent: 0.18 %
Brady Statistic RA Percent Paced: 7.2 %
Date Time Interrogation Session: 20200122091643
Implantable Lead Implant Date: 20190717
Implantable Lead Implant Date: 20190717
Implantable Lead Model: 5076
Lead Channel Impedance Value: 1140 Ohm
Lead Channel Impedance Value: 342 Ohm
Lead Channel Impedance Value: 589 Ohm
Lead Channel Impedance Value: 589 Ohm
Lead Channel Pacing Threshold Amplitude: 0.5 V
Lead Channel Pacing Threshold Pulse Width: 0.4 ms
Lead Channel Setting Pacing Amplitude: 1.5 V
Lead Channel Setting Pacing Pulse Width: 0.4 ms
Lead Channel Setting Pacing Pulse Width: 0.6 ms
MDC IDC LEAD IMPLANT DT: 20190717
MDC IDC LEAD LOCATION: 753858
MDC IDC LEAD LOCATION: 753859
MDC IDC LEAD LOCATION: 753860
MDC IDC MSMT BATTERY VOLTAGE: 3.03 V
MDC IDC MSMT LEADCHNL LV IMPEDANCE VALUE: 1045 Ohm
MDC IDC MSMT LEADCHNL LV IMPEDANCE VALUE: 1045 Ohm
MDC IDC MSMT LEADCHNL LV IMPEDANCE VALUE: 1102 Ohm
MDC IDC MSMT LEADCHNL LV IMPEDANCE VALUE: 1121 Ohm
MDC IDC MSMT LEADCHNL LV IMPEDANCE VALUE: 1159 Ohm
MDC IDC MSMT LEADCHNL LV IMPEDANCE VALUE: 627 Ohm
MDC IDC MSMT LEADCHNL LV IMPEDANCE VALUE: 646 Ohm
MDC IDC MSMT LEADCHNL LV PACING THRESHOLD AMPLITUDE: 1.875 V
MDC IDC MSMT LEADCHNL LV PACING THRESHOLD PULSEWIDTH: 0.6 ms
MDC IDC MSMT LEADCHNL RA IMPEDANCE VALUE: 399 Ohm
MDC IDC MSMT LEADCHNL RA IMPEDANCE VALUE: 627 Ohm
MDC IDC MSMT LEADCHNL RA SENSING INTR AMPL: 2.25 mV
MDC IDC MSMT LEADCHNL RA SENSING INTR AMPL: 2.25 mV
MDC IDC MSMT LEADCHNL RV IMPEDANCE VALUE: 456 Ohm
MDC IDC MSMT LEADCHNL RV PACING THRESHOLD AMPLITUDE: 0.75 V
MDC IDC MSMT LEADCHNL RV PACING THRESHOLD PULSEWIDTH: 0.4 ms
MDC IDC MSMT LEADCHNL RV SENSING INTR AMPL: 15.5 mV
MDC IDC MSMT LEADCHNL RV SENSING INTR AMPL: 15.5 mV
MDC IDC PG IMPLANT DT: 20190717
MDC IDC SET LEADCHNL LV PACING AMPLITUDE: 3 V
MDC IDC SET LEADCHNL RV PACING AMPLITUDE: 2 V
MDC IDC SET LEADCHNL RV SENSING SENSITIVITY: 1.2 mV
MDC IDC STAT BRADY AP VP PERCENT: 7.03 %
MDC IDC STAT BRADY AS VP PERCENT: 91.36 %
MDC IDC STAT BRADY AS VS PERCENT: 1.44 %
MDC IDC STAT BRADY RV PERCENT PACED: 0.06 %

## 2018-12-30 DIAGNOSIS — M25462 Effusion, left knee: Secondary | ICD-10-CM | POA: Diagnosis not present

## 2018-12-30 DIAGNOSIS — M25562 Pain in left knee: Secondary | ICD-10-CM | POA: Diagnosis not present

## 2019-01-19 ENCOUNTER — Other Ambulatory Visit: Payer: Self-pay | Admitting: Neurology

## 2019-01-30 ENCOUNTER — Telehealth: Payer: Self-pay | Admitting: Cardiovascular Disease

## 2019-01-30 NOTE — Telephone Encounter (Signed)
   Primary Cardiologist:  Sinclair Grooms, MD   Patient contacted.  History reviewed.    I have called and spoken to her son Betty [name].  [name] has a history of chronic systolic congestive heart there.  She has a CRT pacer.  She is on good medical therapy.  She also has chronic kidney disease.  She seems to be very stable and her condition has not deteriorated.  Her son Betty [name] confirms the fact that she is doing very well.  Given the current coronavirus outbreak, I think it is best that we delay this echocardiogram for 3 months.  Betty [name] is in agreement and he anticipated this phone  call.   No symptoms to suggest any unstable cardiac conditions.  Based on discussion, with current pandemic situation, we will be postponing this appointment for Betty [name] with a plan for f/u in 12 wks or sooner if feasible/necessary.  If symptoms change, she has been instructed to contact our office.   Routing to C19 CANCEL pool for tracking (P CV DIV CV19 CANCEL - reason for visit "other.") and   assigning priority   3 = >12 wks  Mertie Moores, MD  01/30/2019 4:17 PM         .

## 2019-02-02 ENCOUNTER — Ambulatory Visit (HOSPITAL_COMMUNITY): Payer: Medicare Other

## 2019-02-12 ENCOUNTER — Other Ambulatory Visit: Payer: Self-pay

## 2019-02-12 NOTE — Patient Outreach (Signed)
[facility] Care Management  02/12/2019  [facility] AL[facility] 1946-09-30 [id number]  TELEPHONE SCREENING Referral date: 4//8/20 Referral source: primary MD  Referral reason: onset dementia Insurance: medicare\  Attempt #1  Telephone call to patient's son/ designated party release, [name] regarding primary MD referral for patient. Unable to reach patient. HIPAA compliant voice message left with call back phone number.   PLAN: RNCM will attempt 2nd telephone call to patient within 4 business days.  RNCm will send outreach letter to attempt contact.   [name] RN,BSN,CCM [facility] Telephonic  [phone]

## 2019-02-17 ENCOUNTER — Other Ambulatory Visit: Payer: Self-pay

## 2019-02-17 NOTE — Patient Outreach (Signed)
[facility]) Care Management  02/17/2019  [name] [birth year] [id number]  TELEPHONE SCREENING Referral date: 4//8/20 Referral source: primary MD  Referral reason: onset dementia Insurance: medicare  Telephone call to patient regarding primary MD referral. HIPAA confirmed with patients son and designated party release, [name].  Explained reason for call.  Discussed [facility] care management services.  Son states patient is doing very well right now. Son states if  [name] had called him 3-4 months ago it might have been a different story. Son states patient is doing very well and has no need for services at this time.  RNCM offered to send patient [name] care management brochure for future reference. Son verbally agreed. RNCM confirmed patients mailing address with son.    PLAN: RNCM will close patient due to patient being assessed and having no further needs.  RNCm will send closure letter to patients primary MD.  [facility] will send patient [facility] care management brochure  [name] RN,BSN,CCM [facility] Telephonic  [phone]

## 2019-02-25 ENCOUNTER — Encounter: Payer: Medicare Other | Admitting: *Deleted

## 2019-02-25 ENCOUNTER — Other Ambulatory Visit: Payer: Self-pay

## 2019-02-26 ENCOUNTER — Telehealth: Payer: Self-pay

## 2019-02-26 NOTE — Telephone Encounter (Signed)
Spoke with patient to remind of missed remote transmission 

## 2019-02-27 ENCOUNTER — Ambulatory Visit: Payer: Medicare Other | Admitting: Interventional Cardiology

## 2019-03-02 ENCOUNTER — Other Ambulatory Visit: Payer: Self-pay

## 2019-03-02 ENCOUNTER — Ambulatory Visit (INDEPENDENT_AMBULATORY_CARE_PROVIDER_SITE_OTHER): Payer: Medicare Other | Admitting: *Deleted

## 2019-03-02 DIAGNOSIS — I5022 Chronic systolic (congestive) heart failure: Secondary | ICD-10-CM | POA: Diagnosis not present

## 2019-03-02 DIAGNOSIS — I428 Other cardiomyopathies: Secondary | ICD-10-CM

## 2019-03-02 LAB — CUP PACEART REMOTE DEVICE CHECK
Battery Remaining Longevity: 104 mo
Battery Voltage: 3.01 V
Brady Statistic AP VP Percent: 6.79 %
Brady Statistic AP VS Percent: 0.18 %
Brady Statistic AS VP Percent: 91.57 %
Brady Statistic AS VS Percent: 1.46 %
Brady Statistic RA Percent Paced: 6.98 %
Brady Statistic RV Percent Paced: 0.14 %
Date Time Interrogation Session: 20200425195709
Implantable Lead Implant Date: 20190717
Implantable Lead Implant Date: 20190717
Implantable Lead Implant Date: 20190717
Implantable Lead Location: 753858
Implantable Lead Location: 753859
Implantable Lead Location: 753860
Implantable Lead Model: 5076
Implantable Lead Model: 5076
Implantable Pulse Generator Implant Date: 20190717
Lead Channel Impedance Value: 1064 Ohm
Lead Channel Impedance Value: 1083 Ohm
Lead Channel Impedance Value: 1102 Ohm
Lead Channel Impedance Value: 1140 Ohm
Lead Channel Impedance Value: 1159 Ohm
Lead Channel Impedance Value: 1216 Ohm
Lead Channel Impedance Value: 361 Ohm
Lead Channel Impedance Value: 399 Ohm
Lead Channel Impedance Value: 418 Ohm
Lead Channel Impedance Value: 532 Ohm
Lead Channel Impedance Value: 627 Ohm
Lead Channel Impedance Value: 665 Ohm
Lead Channel Impedance Value: 684 Ohm
Lead Channel Impedance Value: 684 Ohm
Lead Channel Pacing Threshold Amplitude: 0.625 V
Lead Channel Pacing Threshold Amplitude: 0.75 V
Lead Channel Pacing Threshold Amplitude: 2.25 V
Lead Channel Pacing Threshold Pulse Width: 0.4 ms
Lead Channel Pacing Threshold Pulse Width: 0.4 ms
Lead Channel Pacing Threshold Pulse Width: 0.6 ms
Lead Channel Sensing Intrinsic Amplitude: 15 mV
Lead Channel Sensing Intrinsic Amplitude: 15 mV
Lead Channel Sensing Intrinsic Amplitude: 2.25 mV
Lead Channel Sensing Intrinsic Amplitude: 2.25 mV
Lead Channel Setting Pacing Amplitude: 1.5 V
Lead Channel Setting Pacing Amplitude: 2 V
Lead Channel Setting Pacing Amplitude: 3.25 V
Lead Channel Setting Pacing Pulse Width: 0.4 ms
Lead Channel Setting Pacing Pulse Width: 0.6 ms
Lead Channel Setting Sensing Sensitivity: 1.2 mV

## 2019-03-06 DIAGNOSIS — N184 Chronic kidney disease, stage 4 (severe): Secondary | ICD-10-CM | POA: Diagnosis not present

## 2019-03-10 ENCOUNTER — Other Ambulatory Visit: Payer: Self-pay

## 2019-03-10 ENCOUNTER — Telehealth: Payer: Self-pay | Admitting: Neurology

## 2019-03-10 ENCOUNTER — Encounter: Payer: Medicare Other | Admitting: Neurology

## 2019-03-10 NOTE — Progress Notes (Signed)
This encounter was created in error - please disregard.

## 2019-03-10 NOTE — Telephone Encounter (Signed)
The patient is seen at this office for her memory disorder.  Apparently appointment was scheduled for May 5 at 115 for a telephone call.  I called the patient she did not answer.  I called her son he reports he needs to be present for visit due to her memory problems.  Please call the patient's son, Arabell Neria at [phone] and get rescheduled for the next week.  Lets try to do a video visit.

## 2019-03-10 NOTE — Telephone Encounter (Signed)
Spoke with Chrissie Noa and I was able to convert his mothers visit into a doxy.me visit with Sarah for 03/23/2019 at 2:15 pm and to file her insurance. E-mail, mobile number and carrier have been confirmed and sent.  E-mail: [email or web address] Text: [phone] ([place])

## 2019-03-11 NOTE — Progress Notes (Signed)
Remote pacemaker transmission.   

## 2019-03-16 DIAGNOSIS — I129 Hypertensive chronic kidney disease with stage 1 through stage 4 chronic kidney disease, or unspecified chronic kidney disease: Secondary | ICD-10-CM | POA: Diagnosis not present

## 2019-03-16 DIAGNOSIS — N2581 Secondary hyperparathyroidism of renal origin: Secondary | ICD-10-CM | POA: Diagnosis not present

## 2019-03-16 DIAGNOSIS — N184 Chronic kidney disease, stage 4 (severe): Secondary | ICD-10-CM | POA: Diagnosis not present

## 2019-03-16 DIAGNOSIS — D631 Anemia in chronic kidney disease: Secondary | ICD-10-CM | POA: Diagnosis not present

## 2019-03-18 ENCOUNTER — Other Ambulatory Visit: Payer: Self-pay | Admitting: Neurology

## 2019-03-23 ENCOUNTER — Other Ambulatory Visit: Payer: Self-pay | Admitting: Medical

## 2019-03-23 ENCOUNTER — Other Ambulatory Visit: Payer: Self-pay

## 2019-03-23 ENCOUNTER — Ambulatory Visit (INDEPENDENT_AMBULATORY_CARE_PROVIDER_SITE_OTHER): Payer: Medicare Other | Admitting: Neurology

## 2019-03-23 ENCOUNTER — Encounter: Payer: Self-pay | Admitting: Neurology

## 2019-03-23 DIAGNOSIS — M109 Gout, unspecified: Secondary | ICD-10-CM | POA: Diagnosis not present

## 2019-03-23 DIAGNOSIS — F0280 Dementia in other diseases classified elsewhere without behavioral disturbance: Secondary | ICD-10-CM | POA: Diagnosis not present

## 2019-03-23 DIAGNOSIS — G301 Alzheimer's disease with late onset: Secondary | ICD-10-CM | POA: Diagnosis not present

## 2019-03-23 MED ORDER — DONEPEZIL HCL 10 MG PO TABS: 10.0000 mg | ORAL_TABLET | Freq: Every day | ORAL | 5 refills | Status: DC

## 2019-03-23 NOTE — Progress Notes (Signed)
The blood work results are unremarkable. Please call the patient.

## 2019-03-23 NOTE — Progress Notes (Addendum)
Virtual Visit via Video Note  I connected with Betty [name] on 03/23/19 at  2:15 PM EDT by a video enabled telemedicine application and verified that I am speaking with the correct person using two identifiers.  Location: Patient: At her home Provider: In the office    I discussed the limitations of evaluation and management by telemedicine and the availability of in person appointments. The patient expressed understanding and agreed to proceed.  History of Present Illness: 03/23/2019 SS: Betty [name] is a 73 year old female with history of renal insufficiency and a progressive memory problem.  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  She currently has a pacemaker. After meeting in December 2019 with Dr. Patrecia Pour, she has decided that the benefit of aneurysm repair does not outweigh the risks involved unless aneurysm grows. She will have MRA (non-contrast) around June 2020.   She reports her memory is much better.  She now has a pacemaker.  She does live alone, her son is nearby.  She reports that she does drive a car short distances only.  She does not drive at night.  She reports that she does not do much cooking.  Her son will bring her food.  She says she has a good appetite, she enjoys eating, denies any weight loss.  Her son manages her medications.  She reports that she sleeps well at night.  She denies any falls, she has been using a cane as of recently due to a gout flare.  She says before the gout she was very active, would frequently walk with her son.  Her son manages her finances, but she reports that she has the money.  She reports continued trouble with her short-term memory, but her long-term memory is excellent.  She says that since her hospitalization last year her memory has greatly improved and she is feeling much better.  07/16/2018 Dr. Jannifer Franklin: Betty [name] is a 73 year old right-handed black female with a history of  stage IV chronic renal insufficiency and a history of a progressive memory problem.  The patient comes in with her son.  The patient was in the hospital in April 2019, she was noted to have an ejection fraction of 15%.  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  The patient became quite confused in the hospital, she thought that her father and her sister were still alive, the patient was placed on low-dose Seroquel which has been continued.  As an outpatient, the patient was placed on Namzeric but had trouble with confusion and hallucinations, the medication was stopped.  The patient has become more oriented in her home environment, she lives alone but her son comes in to help out.  The patient is not operating motor vehicle at this time.  She needs assistance with keeping up with medications and appointments.  The patient does have some problems with remembering names for people, she mainly has a short-term memory issue.  She denies any problems with word finding.  The patient reports no headaches or dizziness or vision changes.  She has not had any problems with numbness or weakness of the extremities or difficulty with balance or difficulty controlling the bowels or the bladder.  Blood work done recently  shows a normal B12 level and thyroid profile.  She is sent to this office for an evaluation.  The patient's brother also has dementia   Observations/Objective: Video visit with Betty [name] and her son, she is alert, answers questions appropriately, oriented to month, place, and city.  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  Memory disorder  Her MMSE in September 2019 was 24/30.  Her Moca-blind was maybe slightly  decreased at 13/22.  She is currently taking Aricept 5 mg at bedtime.  She is tolerating the medication.  She can increase her dose to 10 mg at bedtime, to maximize medication benefit.  She does still continue to drive.  I suggested her son monitor her driving very closely, evaluate for safety.  He says he monitors closely.  She does not drive at night, only drives short distances.  She does seem to be aware of her driving limitations. Her son emphasizes that she is much improved from last year after her hospitalization.   Per review of the record she had an evaluation in December 2019 with Dr. Estanislado Pandy.  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  She will have a repeat MRA (noncontrast) around June 2020. She has chronic kidney disease. I advised she continue to follow-up with Dr. Estanislado Pandy.   Addendum 03/24/2019 SS: I called the patient's son to ensure they maintain close follow-up with Deveshwar's office. According to the record a scan will be repeated 6 months from December 2019. I didn't see a follow-up appointment in the system. I advised to let us know if they haven't heard anything by the end of June 2020, we can assist in getting the MRA (non-contrast ordered). He verbalized understanding and agreement.   Follow Up Instructions: 09/29/2019 2:15 six-month follow-up   I discussed the assessment and treatment plan with the patient. The patient was provided an opportunity to ask questions and all were answered. The patient agreed with the plan and demonstrated an understanding of the instructions.   The patient was advised to call back or seek an in-person evaluation if the symptoms worsen or if the condition fails to improve as anticipated.  I provided 30 minutes of non-face-to-face time during this encounter.   Evangeline Dakin, DNP  [facility] [address] [place]  [place], [place] [address] [phone]

## 2019-03-23 NOTE — Telephone Encounter (Signed)
This is Dr. Mallie Mussel Smith's patient.

## 2019-04-01 ENCOUNTER — Ambulatory Visit: Payer: Medicare Other | Admitting: Adult Health

## 2019-04-01 ENCOUNTER — Ambulatory Visit: Payer: Medicare Other | Admitting: Neurology

## 2019-04-07 ENCOUNTER — Other Ambulatory Visit: Payer: Self-pay | Admitting: Medical

## 2019-04-21 ENCOUNTER — Telehealth (HOSPITAL_COMMUNITY): Payer: Self-pay | Admitting: Cardiology

## 2019-04-21 NOTE — Telephone Encounter (Signed)

## 2019-04-22 ENCOUNTER — Other Ambulatory Visit: Payer: Self-pay

## 2019-04-22 ENCOUNTER — Ambulatory Visit (HOSPITAL_COMMUNITY): Payer: Medicare Other | Attending: Cardiology

## 2019-04-22 DIAGNOSIS — I5022 Chronic systolic (congestive) heart failure: Secondary | ICD-10-CM | POA: Diagnosis not present

## 2019-04-28 ENCOUNTER — Telehealth: Payer: Self-pay | Admitting: Interventional Cardiology

## 2019-04-28 NOTE — Telephone Encounter (Signed)

## 2019-04-29 ENCOUNTER — Ambulatory Visit: Payer: Medicare Other | Admitting: Interventional Cardiology

## 2019-04-29 NOTE — Progress Notes (Deleted)
Cardiology Office Note:    Date:  04/29/2019   ID:  DARRYL BLUMENSTEIN, DOB [birth year], MRN [id number]  PCP:  Josetta Huddle, MD  Cardiologist:  Sinclair Grooms, MD   Referring MD: Josetta Huddle, MD   No chief complaint on file.   History of Present Illness:    Betty [name] is a 73 y.o. female with a hx of non-ischemic CM, chronic systolic heart failure, CKD 4, dementia, and CRT for LV preservation.  ***  Past Medical History:  Diagnosis Date  . Acute combined systolic and diastolic (congestive) hrt fail ([place])   . Acute mastitis of right breast 10/2003  . Arthritis    bursitis in right shoulder (05/21/2018)  . 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 Chronic kidney disease    CKD stage 3 04/28/15  . CKD (chronic kidney disease), stage IV ([place])   . 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 Colon polyps   . 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 Dementia ([place]) 07/16/2018  . Family history of breast cancer   . GERD (gastroesophageal reflux disease)   . Hypercholesterolemia   . Hypertension   . Non-small cell carcinoma of right lung, stage 1 ([place]) 05/17/2015   Stage IB, right upper lobectomy 04/29/15   . NSCL ca dx'd 03/2015   chemo; RL Lobectomy  . NSTEMI (non-ST elevated myocardial infarction) ([place]) 03/24/2018  . Presence of permanent cardiac pacemaker 05/21/2018  . Pure hypercholesterolemia   . Sciatica of right side   . Seasonal allergic rhinitis   . Tobacco dependence   . Tubular adenoma of colon   . Vitamin D deficiency     Past Surgical History:  Procedure Laterality Date  . BIV PACEMAKER INSERTION CRT-P N/A 05/21/2018   Procedure: BIV PACEMAKER INSERTION CRT-P;  Surgeon: Deboraha Sprang, MD;  Location: [facility];  Service: Cardiovascular;  Laterality: N/A;  . BREAST SURGERY     small mass removed from left breast--benign  . CRYO INTERCOSTAL NERVE BLOCK Right 04/29/2015   Procedure: CRYO INTERCOSTAL NERVE BLOCK;  Surgeon: [name], MD;  Location: [facility];  Service: Thoracic;  Laterality: Right;  . INSERT / REPLACE / REMOVE PACEMAKER  05/21/2018  . LOBECTOMY Right 04/29/2015   Procedure: RIGHT LOWER LUNG LOBECTOMY ;  Surgeon: [name], MD;  Location: [facility];  Service: Thoracic;  Laterality: Right;  . LYMPH NODE DISSECTION Right 04/29/2015   Procedure: RIGHT LUNG LYMPH NODE DISSECTION;  Surgeon: [name], MD;  Location: [facility];  Service: Thoracic;  Laterality: Right;  . RIGHT/LEFT HEART CATH AND CORONARY ANGIOGRAPHY N/A 04/24/2018   Procedure: RIGHT/LEFT HEART CATH AND CORONARY ANGIOGRAPHY;  Surgeon: [name], MD;  Location: [facility];  Service: Cardiovascular;  Laterality: N/A;  . VAGINAL DELIVERY     51 yrs ago  . VIDEO ASSISTED THORACOSCOPY (VATS)/ LOBECTOMY Right 04/29/2015   Procedure: RIGHT VIDEO ASSISTED THORACOSCOPY (VATS) WEDGE RESECTION/ RIGHT LOWER LOBECTOMY, CRYO-ANALGESIA OF INTERCOSTAL NERVES;  Surgeon: [name], MD;  Location: [facility];  Service: Thoracic;  Laterality: Right;    Current Medications: No outpatient medications have been marked as taking for the 04/29/19 encounter (Appointment) with Belva Crome, MD.     Allergies:   Sulfa antibiotics   Social History   Socioeconomic History  . Marital status: Single    Spouse name: Not on file  . Number of children:  1  . Years of education: 38  . Highest education level: Not on file  Occupational History  . Occupation: Retired  Scientific laboratory technician  . Financial resource strain: Not hard at all  . Food insecurity    Worry: Patient refused    Inability: Patient refused  . Transportation needs    Medical: Patient refused    Non-medical: Patient refused  Tobacco Use  . Smoking status: Former Smoker    Packs/day: 0.75    Years: 47.00    Pack years: 35.25    Types: Cigarettes    Quit date: 04/29/2015    Years since quitting: 4.0  .  Smokeless tobacco: Never Used  Substance and Sexual Activity  . Alcohol use: Yes    Comment: 05/21/2018 "maybe 6 drinks/year"  . Drug use: Never  . Sexual activity: Not on file  Lifestyle  . Physical activity    Days per week: Patient refused    Minutes per session: Patient refused  . Stress: Only a little  Relationships  . Social Herbalist on phone: Patient refused    Gets together: Patient refused    Attends religious service: Patient refused    Active member of club or organization: Patient refused    Attends meetings of clubs or organizations: Patient refused    Relationship status: Patient refused  Other Topics Concern  . Not on file  Social History Narrative   Lives alone   Caffeine use: 1 cup coffee per day   Right handed      Family History: The patient's family history includes Cancer in her father, sister, and sister; Dementia in her brother; Heart attack in her brother and brother; Other in her son.  ROS:   Please see the history of present illness.    *** All other systems reviewed and are negative.  EKGs/Labs/Other Studies Reviewed:    The following studies were reviewed today: ***  EKG:  EKG ***  Recent Labs: 05/15/2018: NT-Pro BNP 26,811 08/04/2018: ALT 46; BUN 29; Creatinine 2.61; Hemoglobin 12.1; Platelet Count 282; Potassium 3.7; Sodium 143  Recent Lipid Panel    Component Value Date/Time   CHOL 175 03/25/2018 0216   TRIG 103 03/25/2018 0216   HDL 39 (L) 03/25/2018 0216   CHOLHDL 4.5 03/25/2018 0216   VLDL 21 03/25/2018 0216   LDLCALC 115 (H) 03/25/2018 0216    Physical Exam:    VS:  There were no vitals taken for this visit.    Wt Readings from Last 3 Encounters:  09/29/18 129 lb (58.5 kg)  08/26/18 130 lb 12.8 oz (59.3 kg)  08/06/18 128 lb 6.4 oz (58.2 kg)     GEN: ***. No acute distress HEENT: Normal NECK: No JVD. LYMPHATICS: No lymphadenopathy CARDIAC: ***RRR.  *** murmur, ***gallop, ***edema VASCULAR: *** Pulses, ***  bruits RESPIRATORY:  Clear to auscultation without rales, wheezing or rhonchi  ABDOMEN: Soft, non-tender, non-distended, No pulsatile mass, MUSCULOSKELETAL: No deformity  SKIN: Warm and dry NEUROLOGIC:  Alert and oriented x 3 PSYCHIATRIC:  Normal affect   ASSESSMENT:    1. Chronic systolic heart failure ([place])   2. CKD (chronic kidney disease) stage 4, GFR 15-29 ml/min (HCC)   3. S/P biventricular cardiac pacemaker procedure   4. Left bundle branch block   5. Cognitive and neurobehavioral dysfunction   6. Coronary artery disease involving native coronary artery of native heart without angina pectoris   7. Educated About Covid-19 Virus Infection    PLAN:  In order of problems listed above:  1. ***   Medication Adjustments/Labs and Tests Ordered: Current medicines are reviewed at length with the patient today.  Concerns regarding medicines are outlined above.  No orders of the defined types were placed in this encounter.  No orders of the defined types were placed in this encounter.   There are no Patient Instructions on file for this visit.   Signed, Sinclair Grooms, MD  04/29/2019 1:22 PM    [facility]

## 2019-05-05 ENCOUNTER — Telehealth (HOSPITAL_COMMUNITY): Payer: Self-pay

## 2019-05-05 NOTE — Telephone Encounter (Signed)
Called to schedule f/u mri, no answer, left vm. AW 

## 2019-05-13 ENCOUNTER — Other Ambulatory Visit: Payer: Self-pay

## 2019-05-13 ENCOUNTER — Inpatient Hospital Stay (HOSPITAL_COMMUNITY)
Admission: EM | Admit: 2019-05-13 | Discharge: 2019-06-13 | DRG: 870 | Disposition: A | Discharge status: Skilled Nursing Facility | Payer: Medicare Other | Attending: Family Medicine | Admitting: Family Medicine

## 2019-05-13 ENCOUNTER — Encounter (HOSPITAL_COMMUNITY): Payer: Self-pay

## 2019-05-13 DIAGNOSIS — Z9221 Personal history of antineoplastic chemotherapy: Secondary | ICD-10-CM

## 2019-05-13 DIAGNOSIS — A4189 Other specified sepsis: Secondary | ICD-10-CM | POA: Diagnosis not present

## 2019-05-13 DIAGNOSIS — L89152 Pressure ulcer of sacral region, stage 2: Secondary | ICD-10-CM | POA: Diagnosis not present

## 2019-05-13 DIAGNOSIS — K219 Gastro-esophageal reflux disease without esophagitis: Secondary | ICD-10-CM | POA: Diagnosis present

## 2019-05-13 DIAGNOSIS — U071 COVID-19: Secondary | ICD-10-CM | POA: Diagnosis present

## 2019-05-13 DIAGNOSIS — J9621 Acute and chronic respiratory failure with hypoxia: Secondary | ICD-10-CM | POA: Diagnosis not present

## 2019-05-13 DIAGNOSIS — J1289 Other viral pneumonia: Secondary | ICD-10-CM | POA: Diagnosis not present

## 2019-05-13 DIAGNOSIS — Z9889 Other specified postprocedural states: Secondary | ICD-10-CM

## 2019-05-13 DIAGNOSIS — J439 Emphysema, unspecified: Secondary | ICD-10-CM | POA: Diagnosis present

## 2019-05-13 DIAGNOSIS — D631 Anemia in chronic kidney disease: Secondary | ICD-10-CM | POA: Diagnosis present

## 2019-05-13 DIAGNOSIS — I252 Old myocardial infarction: Secondary | ICD-10-CM

## 2019-05-13 DIAGNOSIS — Z950 Presence of cardiac pacemaker: Secondary | ICD-10-CM

## 2019-05-13 DIAGNOSIS — Z9981 Dependence on supplemental oxygen: Secondary | ICD-10-CM | POA: Diagnosis not present

## 2019-05-13 DIAGNOSIS — N179 Acute kidney failure, unspecified: Secondary | ICD-10-CM | POA: Diagnosis present

## 2019-05-13 DIAGNOSIS — Z452 Encounter for adjustment and management of vascular access device: Secondary | ICD-10-CM

## 2019-05-13 DIAGNOSIS — E871 Hypo-osmolality and hyponatremia: Secondary | ICD-10-CM | POA: Diagnosis not present

## 2019-05-13 DIAGNOSIS — I130 Hypertensive heart and chronic kidney disease with heart failure and stage 1 through stage 4 chronic kidney disease, or unspecified chronic kidney disease: Secondary | ICD-10-CM | POA: Diagnosis present

## 2019-05-13 DIAGNOSIS — E7800 Pure hypercholesterolemia, unspecified: Secondary | ICD-10-CM | POA: Diagnosis present

## 2019-05-13 DIAGNOSIS — D6489 Other specified anemias: Secondary | ICD-10-CM | POA: Diagnosis not present

## 2019-05-13 DIAGNOSIS — Z978 Presence of other specified devices: Secondary | ICD-10-CM

## 2019-05-13 DIAGNOSIS — Z4659 Encounter for fitting and adjustment of other gastrointestinal appliance and device: Secondary | ICD-10-CM

## 2019-05-13 DIAGNOSIS — Z01818 Encounter for other preprocedural examination: Secondary | ICD-10-CM

## 2019-05-13 DIAGNOSIS — I480 Paroxysmal atrial fibrillation: Secondary | ICD-10-CM | POA: Diagnosis not present

## 2019-05-13 DIAGNOSIS — N390 Urinary tract infection, site not specified: Secondary | ICD-10-CM | POA: Diagnosis not present

## 2019-05-13 DIAGNOSIS — Z87891 Personal history of nicotine dependence: Secondary | ICD-10-CM

## 2019-05-13 DIAGNOSIS — Z803 Family history of malignant neoplasm of breast: Secondary | ICD-10-CM

## 2019-05-13 DIAGNOSIS — D62 Acute posthemorrhagic anemia: Secondary | ICD-10-CM | POA: Diagnosis not present

## 2019-05-13 DIAGNOSIS — F419 Anxiety disorder, unspecified: Secondary | ICD-10-CM | POA: Diagnosis not present

## 2019-05-13 DIAGNOSIS — Z85118 Personal history of other malignant neoplasm of bronchus and lung: Secondary | ICD-10-CM

## 2019-05-13 DIAGNOSIS — T380X5A Adverse effect of glucocorticoids and synthetic analogues, initial encounter: Secondary | ICD-10-CM | POA: Diagnosis not present

## 2019-05-13 DIAGNOSIS — Z66 Do not resuscitate: Secondary | ICD-10-CM | POA: Diagnosis not present

## 2019-05-13 DIAGNOSIS — E785 Hyperlipidemia, unspecified: Secondary | ICD-10-CM | POA: Diagnosis present

## 2019-05-13 DIAGNOSIS — E870 Hyperosmolality and hypernatremia: Secondary | ICD-10-CM | POA: Diagnosis not present

## 2019-05-13 DIAGNOSIS — E1122 Type 2 diabetes mellitus with diabetic chronic kidney disease: Secondary | ICD-10-CM | POA: Diagnosis present

## 2019-05-13 DIAGNOSIS — Z7982 Long term (current) use of aspirin: Secondary | ICD-10-CM

## 2019-05-13 DIAGNOSIS — I2510 Atherosclerotic heart disease of native coronary artery without angina pectoris: Secondary | ICD-10-CM | POA: Diagnosis present

## 2019-05-13 DIAGNOSIS — M1A9XX0 Chronic gout, unspecified, without tophus (tophi): Secondary | ICD-10-CM | POA: Diagnosis present

## 2019-05-13 DIAGNOSIS — E559 Vitamin D deficiency, unspecified: Secondary | ICD-10-CM | POA: Diagnosis present

## 2019-05-13 DIAGNOSIS — I5042 Chronic combined systolic (congestive) and diastolic (congestive) heart failure: Secondary | ICD-10-CM | POA: Diagnosis present

## 2019-05-13 DIAGNOSIS — E876 Hypokalemia: Secondary | ICD-10-CM | POA: Diagnosis not present

## 2019-05-13 DIAGNOSIS — C3491 Malignant neoplasm of unspecified part of right bronchus or lung: Secondary | ICD-10-CM | POA: Diagnosis present

## 2019-05-13 DIAGNOSIS — L8990 Pressure ulcer of unspecified site, unspecified stage: Secondary | ICD-10-CM | POA: Insufficient documentation

## 2019-05-13 DIAGNOSIS — R4182 Altered mental status, unspecified: Secondary | ICD-10-CM

## 2019-05-13 DIAGNOSIS — N184 Chronic kidney disease, stage 4 (severe): Secondary | ICD-10-CM | POA: Diagnosis present

## 2019-05-13 DIAGNOSIS — K921 Melena: Secondary | ICD-10-CM | POA: Diagnosis not present

## 2019-05-13 DIAGNOSIS — Z801 Family history of malignant neoplasm of trachea, bronchus and lung: Secondary | ICD-10-CM

## 2019-05-13 DIAGNOSIS — F0390 Unspecified dementia without behavioral disturbance: Secondary | ICD-10-CM | POA: Diagnosis present

## 2019-05-13 DIAGNOSIS — I517 Cardiomegaly: Secondary | ICD-10-CM | POA: Diagnosis not present

## 2019-05-13 DIAGNOSIS — Z902 Acquired absence of lung [part of]: Secondary | ICD-10-CM

## 2019-05-13 DIAGNOSIS — G9341 Metabolic encephalopathy: Secondary | ICD-10-CM | POA: Diagnosis present

## 2019-05-13 DIAGNOSIS — I10 Essential (primary) hypertension: Secondary | ICD-10-CM | POA: Diagnosis present

## 2019-05-13 DIAGNOSIS — Z8249 Family history of ischemic heart disease and other diseases of the circulatory system: Secondary | ICD-10-CM

## 2019-05-13 DIAGNOSIS — J9601 Acute respiratory failure with hypoxia: Secondary | ICD-10-CM

## 2019-05-13 DIAGNOSIS — E873 Alkalosis: Secondary | ICD-10-CM | POA: Diagnosis not present

## 2019-05-13 DIAGNOSIS — Z0189 Encounter for other specified special examinations: Secondary | ICD-10-CM

## 2019-05-13 DIAGNOSIS — I5022 Chronic systolic (congestive) heart failure: Secondary | ICD-10-CM | POA: Diagnosis present

## 2019-05-13 DIAGNOSIS — I428 Other cardiomyopathies: Secondary | ICD-10-CM | POA: Diagnosis present

## 2019-05-13 DIAGNOSIS — E1165 Type 2 diabetes mellitus with hyperglycemia: Secondary | ICD-10-CM | POA: Diagnosis not present

## 2019-05-13 DIAGNOSIS — R1310 Dysphagia, unspecified: Secondary | ICD-10-CM | POA: Diagnosis not present

## 2019-05-13 DIAGNOSIS — R0600 Dyspnea, unspecified: Secondary | ICD-10-CM

## 2019-05-13 LAB — CBG MONITORING, ED: Glucose-Capillary: 160 mg/dL — ABNORMAL HIGH (ref 70–99)

## 2019-05-13 MED ORDER — SODIUM CHLORIDE 0.9% FLUSH: 3.0000 mL | Freq: Once | INTRAVENOUS | Status: DC

## 2019-05-13 NOTE — ED Triage Notes (Signed)
Pt arrives with son for AMS. Pt lives alone and son states he calls her everyday to help her take her meds and brings her dinner.  It took him several calls to get her to answer today and she stated she had fell in the floor. Son states she has not eaten any of the past 3 dinner plates he has brought her and she has been more lethargic the past few days. Pt denies any complaints at this time. Pt alert and oriented to self and place, son states she has some confusion at baseline. Nad noted

## 2019-05-14 ENCOUNTER — Encounter (HOSPITAL_COMMUNITY): Payer: Self-pay | Admitting: Internal Medicine

## 2019-05-14 ENCOUNTER — Emergency Department (HOSPITAL_COMMUNITY): Payer: Medicare Other

## 2019-05-14 DIAGNOSIS — D631 Anemia in chronic kidney disease: Secondary | ICD-10-CM | POA: Diagnosis not present

## 2019-05-14 DIAGNOSIS — R4182 Altered mental status, unspecified: Secondary | ICD-10-CM | POA: Diagnosis not present

## 2019-05-14 DIAGNOSIS — J069 Acute upper respiratory infection, unspecified: Secondary | ICD-10-CM | POA: Diagnosis not present

## 2019-05-14 DIAGNOSIS — R2689 Other abnormalities of gait and mobility: Secondary | ICD-10-CM | POA: Diagnosis not present

## 2019-05-14 DIAGNOSIS — E119 Type 2 diabetes mellitus without complications: Secondary | ICD-10-CM | POA: Diagnosis not present

## 2019-05-14 DIAGNOSIS — I10 Essential (primary) hypertension: Secondary | ICD-10-CM | POA: Diagnosis not present

## 2019-05-14 DIAGNOSIS — Z7401 Bed confinement status: Secondary | ICD-10-CM | POA: Diagnosis not present

## 2019-05-14 DIAGNOSIS — R2681 Unsteadiness on feet: Secondary | ICD-10-CM | POA: Diagnosis not present

## 2019-05-14 DIAGNOSIS — Z452 Encounter for adjustment and management of vascular access device: Secondary | ICD-10-CM | POA: Diagnosis not present

## 2019-05-14 DIAGNOSIS — R1312 Dysphagia, oropharyngeal phase: Secondary | ICD-10-CM | POA: Diagnosis not present

## 2019-05-14 DIAGNOSIS — J9601 Acute respiratory failure with hypoxia: Secondary | ICD-10-CM | POA: Diagnosis not present

## 2019-05-14 DIAGNOSIS — K219 Gastro-esophageal reflux disease without esophagitis: Secondary | ICD-10-CM | POA: Diagnosis present

## 2019-05-14 DIAGNOSIS — Z978 Presence of other specified devices: Secondary | ICD-10-CM | POA: Diagnosis not present

## 2019-05-14 DIAGNOSIS — C3491 Malignant neoplasm of unspecified part of right bronchus or lung: Secondary | ICD-10-CM | POA: Diagnosis not present

## 2019-05-14 DIAGNOSIS — J9811 Atelectasis: Secondary | ICD-10-CM | POA: Diagnosis not present

## 2019-05-14 DIAGNOSIS — R0602 Shortness of breath: Secondary | ICD-10-CM | POA: Diagnosis not present

## 2019-05-14 DIAGNOSIS — R410 Disorientation, unspecified: Secondary | ICD-10-CM | POA: Diagnosis not present

## 2019-05-14 DIAGNOSIS — F0150 Vascular dementia without behavioral disturbance: Secondary | ICD-10-CM | POA: Diagnosis not present

## 2019-05-14 DIAGNOSIS — I129 Hypertensive chronic kidney disease with stage 1 through stage 4 chronic kidney disease, or unspecified chronic kidney disease: Secondary | ICD-10-CM | POA: Diagnosis not present

## 2019-05-14 DIAGNOSIS — I252 Old myocardial infarction: Secondary | ICD-10-CM | POA: Diagnosis not present

## 2019-05-14 DIAGNOSIS — R140 Abdominal distension (gaseous): Secondary | ICD-10-CM | POA: Diagnosis not present

## 2019-05-14 DIAGNOSIS — A4189 Other specified sepsis: Secondary | ICD-10-CM | POA: Diagnosis present

## 2019-05-14 DIAGNOSIS — U071 COVID-19: Secondary | ICD-10-CM | POA: Diagnosis present

## 2019-05-14 DIAGNOSIS — R41841 Cognitive communication deficit: Secondary | ICD-10-CM | POA: Diagnosis not present

## 2019-05-14 DIAGNOSIS — Z66 Do not resuscitate: Secondary | ICD-10-CM | POA: Diagnosis not present

## 2019-05-14 DIAGNOSIS — Z4689 Encounter for fitting and adjustment of other specified devices: Secondary | ICD-10-CM | POA: Diagnosis not present

## 2019-05-14 DIAGNOSIS — I517 Cardiomegaly: Secondary | ICD-10-CM | POA: Diagnosis not present

## 2019-05-14 DIAGNOSIS — R0902 Hypoxemia: Secondary | ICD-10-CM | POA: Diagnosis not present

## 2019-05-14 DIAGNOSIS — E1122 Type 2 diabetes mellitus with diabetic chronic kidney disease: Secondary | ICD-10-CM | POA: Diagnosis present

## 2019-05-14 DIAGNOSIS — N179 Acute kidney failure, unspecified: Secondary | ICD-10-CM | POA: Diagnosis present

## 2019-05-14 DIAGNOSIS — J9621 Acute and chronic respiratory failure with hypoxia: Secondary | ICD-10-CM | POA: Diagnosis present

## 2019-05-14 DIAGNOSIS — R278 Other lack of coordination: Secondary | ICD-10-CM | POA: Diagnosis not present

## 2019-05-14 DIAGNOSIS — E785 Hyperlipidemia, unspecified: Secondary | ICD-10-CM | POA: Diagnosis present

## 2019-05-14 DIAGNOSIS — J988 Other specified respiratory disorders: Secondary | ICD-10-CM | POA: Diagnosis not present

## 2019-05-14 DIAGNOSIS — G9341 Metabolic encephalopathy: Secondary | ICD-10-CM | POA: Diagnosis present

## 2019-05-14 DIAGNOSIS — Z950 Presence of cardiac pacemaker: Secondary | ICD-10-CM | POA: Diagnosis not present

## 2019-05-14 DIAGNOSIS — J1289 Other viral pneumonia: Secondary | ICD-10-CM | POA: Diagnosis present

## 2019-05-14 DIAGNOSIS — E871 Hypo-osmolality and hyponatremia: Secondary | ICD-10-CM | POA: Diagnosis not present

## 2019-05-14 DIAGNOSIS — R400 Somnolence: Secondary | ICD-10-CM | POA: Diagnosis not present

## 2019-05-14 DIAGNOSIS — I5022 Chronic systolic (congestive) heart failure: Secondary | ICD-10-CM | POA: Diagnosis not present

## 2019-05-14 DIAGNOSIS — I4891 Unspecified atrial fibrillation: Secondary | ICD-10-CM | POA: Diagnosis not present

## 2019-05-14 DIAGNOSIS — R579 Shock, unspecified: Secondary | ICD-10-CM | POA: Diagnosis not present

## 2019-05-14 DIAGNOSIS — M6281 Muscle weakness (generalized): Secondary | ICD-10-CM | POA: Diagnosis not present

## 2019-05-14 DIAGNOSIS — E870 Hyperosmolality and hypernatremia: Secondary | ICD-10-CM | POA: Diagnosis not present

## 2019-05-14 DIAGNOSIS — N390 Urinary tract infection, site not specified: Secondary | ICD-10-CM | POA: Diagnosis not present

## 2019-05-14 DIAGNOSIS — I959 Hypotension, unspecified: Secondary | ICD-10-CM | POA: Diagnosis not present

## 2019-05-14 DIAGNOSIS — M2550 Pain in unspecified joint: Secondary | ICD-10-CM | POA: Diagnosis not present

## 2019-05-14 DIAGNOSIS — F0151 Vascular dementia with behavioral disturbance: Secondary | ICD-10-CM | POA: Diagnosis not present

## 2019-05-14 DIAGNOSIS — G301 Alzheimer's disease with late onset: Secondary | ICD-10-CM | POA: Diagnosis not present

## 2019-05-14 DIAGNOSIS — N189 Chronic kidney disease, unspecified: Secondary | ICD-10-CM | POA: Diagnosis not present

## 2019-05-14 DIAGNOSIS — A419 Sepsis, unspecified organism: Secondary | ICD-10-CM | POA: Diagnosis not present

## 2019-05-14 DIAGNOSIS — I5042 Chronic combined systolic (congestive) and diastolic (congestive) heart failure: Secondary | ICD-10-CM | POA: Diagnosis present

## 2019-05-14 DIAGNOSIS — F0390 Unspecified dementia without behavioral disturbance: Secondary | ICD-10-CM | POA: Diagnosis present

## 2019-05-14 DIAGNOSIS — N184 Chronic kidney disease, stage 4 (severe): Secondary | ICD-10-CM | POA: Diagnosis present

## 2019-05-14 DIAGNOSIS — Z4682 Encounter for fitting and adjustment of non-vascular catheter: Secondary | ICD-10-CM | POA: Diagnosis not present

## 2019-05-14 DIAGNOSIS — F0280 Dementia in other diseases classified elsewhere without behavioral disturbance: Secondary | ICD-10-CM | POA: Diagnosis not present

## 2019-05-14 DIAGNOSIS — K922 Gastrointestinal hemorrhage, unspecified: Secondary | ICD-10-CM | POA: Diagnosis not present

## 2019-05-14 DIAGNOSIS — I428 Other cardiomyopathies: Secondary | ICD-10-CM | POA: Diagnosis present

## 2019-05-14 DIAGNOSIS — J439 Emphysema, unspecified: Secondary | ICD-10-CM | POA: Diagnosis present

## 2019-05-14 DIAGNOSIS — D62 Acute posthemorrhagic anemia: Secondary | ICD-10-CM | POA: Diagnosis not present

## 2019-05-14 DIAGNOSIS — E559 Vitamin D deficiency, unspecified: Secondary | ICD-10-CM | POA: Diagnosis present

## 2019-05-14 DIAGNOSIS — I130 Hypertensive heart and chronic kidney disease with heart failure and stage 1 through stage 4 chronic kidney disease, or unspecified chronic kidney disease: Secondary | ICD-10-CM | POA: Diagnosis present

## 2019-05-14 DIAGNOSIS — K921 Melena: Secondary | ICD-10-CM | POA: Diagnosis not present

## 2019-05-14 DIAGNOSIS — E873 Alkalosis: Secondary | ICD-10-CM | POA: Diagnosis not present

## 2019-05-14 DIAGNOSIS — R918 Other nonspecific abnormal finding of lung field: Secondary | ICD-10-CM | POA: Diagnosis not present

## 2019-05-14 DIAGNOSIS — J189 Pneumonia, unspecified organism: Secondary | ICD-10-CM | POA: Diagnosis not present

## 2019-05-14 LAB — COMPREHENSIVE METABOLIC PANEL
ALT: 22 U/L (ref 0–44)
AST: 32 U/L (ref 15–41)
Albumin: 3.4 g/dL — ABNORMAL LOW (ref 3.5–5.0)
Alkaline Phosphatase: 76 U/L (ref 38–126)
Anion gap: 17 — ABNORMAL HIGH (ref 5–15)
BUN: 60 mg/dL — ABNORMAL HIGH (ref 8–23)
CO2: 17 mmol/L — ABNORMAL LOW (ref 22–32)
Calcium: 9 mg/dL (ref 8.9–10.3)
Chloride: 103 mmol/L (ref 98–111)
Creatinine, Ser: 3.27 mg/dL — ABNORMAL HIGH (ref 0.44–1.00)
GFR calc Af Amer: 15 mL/min — ABNORMAL LOW (ref >60)
GFR calc non Af Amer: 13 mL/min — ABNORMAL LOW (ref >60)
Glucose, Bld: 157 mg/dL — ABNORMAL HIGH (ref 70–99)
Potassium: 4.4 mmol/L (ref 3.5–5.1)
Sodium: 137 mmol/L (ref 135–145)
Total Bilirubin: 0.7 mg/dL (ref 0.3–1.2)
Total Protein: 7.4 g/dL (ref 6.5–8.1)

## 2019-05-14 LAB — CBC
HCT: 39 % (ref 36.0–46.0)
Hemoglobin: 12.8 g/dL (ref 12.0–15.0)
MCH: 29.7 pg (ref 26.0–34.0)
MCHC: 32.8 g/dL (ref 30.0–36.0)
MCV: 90.5 fL (ref 80.0–100.0)
Platelets: 322 10*3/uL (ref 150–400)
RBC: 4.31 MIL/uL (ref 3.87–5.11)
RDW: 12.4 % (ref 11.5–15.5)
WBC: 10.3 10*3/uL (ref 4.0–10.5)
nRBC: 0 % (ref 0.0–0.2)

## 2019-05-14 LAB — D-DIMER, QUANTITATIVE: D-Dimer, Quant: 3.17 ug/mL-FEU — ABNORMAL HIGH (ref 0.00–0.50)

## 2019-05-14 LAB — FERRITIN: Ferritin: 386 ng/mL — ABNORMAL HIGH (ref 11–307)

## 2019-05-14 LAB — PROCALCITONIN: Procalcitonin: 0.59 ng/mL

## 2019-05-14 LAB — FIBRINOGEN: Fibrinogen: 672 mg/dL — ABNORMAL HIGH (ref 210–475)

## 2019-05-14 LAB — LACTATE DEHYDROGENASE: LDH: 393 U/L — ABNORMAL HIGH (ref 98–192)

## 2019-05-14 LAB — SARS CORONAVIRUS 2 BY RT PCR (HOSPITAL ORDER, PERFORMED IN ~~LOC~~ HOSPITAL LAB): SARS Coronavirus 2: POSITIVE — AB

## 2019-05-14 LAB — BRAIN NATRIURETIC PEPTIDE: B Natriuretic Peptide: 177.4 pg/mL — ABNORMAL HIGH (ref 0.0–100.0)

## 2019-05-14 LAB — CK: Total CK: 189 U/L (ref 38–234)

## 2019-05-14 LAB — C-REACTIVE PROTEIN: CRP: 10.9 mg/dL — ABNORMAL HIGH (ref <1.0)

## 2019-05-14 MED ORDER — DOCUSATE SODIUM 100 MG PO CAPS
100.0000 mg | ORAL_CAPSULE | Freq: Two times a day (BID) | ORAL | Status: DC
  Administered 2019-05-14 – 2019-05-15 (×2): 100 mg via ORAL
  Filled 2019-05-14 (×4): qty 1

## 2019-05-14 MED ORDER — ISOSORB DINITRATE-HYDRALAZINE 20-37.5 MG PO TABS
1.0000 | ORAL_TABLET | Freq: Two times a day (BID) | ORAL | Status: DC
  Administered 2019-05-14 – 2019-05-15 (×4): 1 via ORAL
  Filled 2019-05-14 (×7): qty 1

## 2019-05-14 MED ORDER — BISACODYL 5 MG PO TBEC: 5.0000 mg | DELAYED_RELEASE_TABLET | Freq: Every day | ORAL | Status: DC | PRN

## 2019-05-14 MED ORDER — ENOXAPARIN SODIUM 30 MG/0.3ML ~~LOC~~ SOLN
30.0000 mg | SUBCUTANEOUS | Status: DC
  Administered 2019-05-14 – 2019-05-15 (×2): 30 mg via SUBCUTANEOUS
  Filled 2019-05-14: qty 0.3

## 2019-05-14 MED ORDER — DONEPEZIL HCL 10 MG PO TABS
10.0000 mg | ORAL_TABLET | Freq: Every day | ORAL | Status: DC
  Administered 2019-05-14 – 2019-05-21 (×7): 10 mg via ORAL
  Filled 2019-05-14 (×9): qty 1

## 2019-05-14 MED ORDER — METHYLPREDNISOLONE SODIUM SUCC 125 MG IJ SOLR
60.0000 mg | Freq: Two times a day (BID) | INTRAMUSCULAR | Status: DC
  Administered 2019-05-14 – 2019-05-16 (×6): 60 mg via INTRAVENOUS
  Filled 2019-05-14 (×8): qty 2

## 2019-05-14 MED ORDER — OXYCODONE HCL 5 MG PO TABS
5.0000 mg | ORAL_TABLET | ORAL | Status: DC | PRN
  Administered 2019-05-15: 5 mg via ORAL
  Filled 2019-05-14: qty 1

## 2019-05-14 MED ORDER — ONDANSETRON HCL 4 MG/2ML IJ SOLN: 4.0000 mg | Freq: Four times a day (QID) | INTRAMUSCULAR | Status: DC | PRN

## 2019-05-14 MED ORDER — SODIUM CHLORIDE 0.9% FLUSH
3.0000 mL | Freq: Two times a day (BID) | INTRAVENOUS | Status: DC
  Administered 2019-05-14 – 2019-05-28 (×23): 3 mL via INTRAVENOUS
  Administered 2019-05-29: 10 mL via INTRAVENOUS
  Administered 2019-05-30 – 2019-06-01 (×5): 3 mL via INTRAVENOUS
  Administered 2019-06-01: 10 mL via INTRAVENOUS
  Administered 2019-06-02 – 2019-06-13 (×13): 3 mL via INTRAVENOUS

## 2019-05-14 MED ORDER — SODIUM CHLORIDE 0.9 % IV BOLUS
500.0000 mL | Freq: Once | INTRAVENOUS | Status: AC
  Administered 2019-05-14: 500 mL via INTRAVENOUS

## 2019-05-14 MED ORDER — ASPIRIN EC 81 MG PO TBEC
81.0000 mg | DELAYED_RELEASE_TABLET | Freq: Every day | ORAL | Status: DC
  Administered 2019-05-14 – 2019-05-18 (×5): 81 mg via ORAL
  Filled 2019-05-14 (×5): qty 1

## 2019-05-14 MED ORDER — ONDANSETRON HCL 4 MG PO TABS: 4.0000 mg | ORAL_TABLET | Freq: Four times a day (QID) | ORAL | Status: DC | PRN

## 2019-05-14 MED ORDER — POLYETHYLENE GLYCOL 3350 17 G PO PACK: 17.0000 g | PACK | Freq: Every day | ORAL | Status: DC | PRN

## 2019-05-14 MED ORDER — METOPROLOL SUCCINATE ER 100 MG PO TB24
100.0000 mg | ORAL_TABLET | Freq: Every day | ORAL | Status: DC
  Administered 2019-05-14 – 2019-05-15 (×2): 100 mg via ORAL
  Filled 2019-05-14 (×4): qty 1

## 2019-05-14 MED ORDER — FEBUXOSTAT 40 MG PO TABS
40.0000 mg | ORAL_TABLET | Freq: Every day | ORAL | Status: DC
  Administered 2019-05-15 – 2019-05-18 (×4): 40 mg via ORAL
  Filled 2019-05-14 (×8): qty 1

## 2019-05-14 MED ORDER — SODIUM CHLORIDE 0.9 % IV SOLN
200.0000 mg | Freq: Once | INTRAVENOUS | Status: AC
  Administered 2019-05-14: 200 mg via INTRAVENOUS
  Filled 2019-05-14: qty 40

## 2019-05-14 MED ORDER — SODIUM CHLORIDE 0.9 % IV SOLN
100.0000 mg | INTRAVENOUS | Status: AC
  Administered 2019-05-15 – 2019-05-18 (×4): 100 mg via INTRAVENOUS
  Filled 2019-05-14 (×4): qty 20

## 2019-05-14 MED ORDER — ATORVASTATIN CALCIUM 40 MG PO TABS
40.0000 mg | ORAL_TABLET | Freq: Every day | ORAL | Status: DC
  Administered 2019-05-14: 19:00:00 40 mg via ORAL
  Filled 2019-05-14 (×2): qty 1

## 2019-05-14 MED ORDER — ACETAMINOPHEN 325 MG PO TABS
650.0000 mg | ORAL_TABLET | Freq: Four times a day (QID) | ORAL | Status: DC | PRN
  Administered 2019-05-15 – 2019-05-20 (×2): 650 mg via ORAL
  Filled 2019-05-14 (×2): qty 2

## 2019-05-14 NOTE — Plan of Care (Signed)
Pt is progressing 

## 2019-05-14 NOTE — ED Notes (Signed)
This RN informed pt's son that pt will be transferred to [facility] and gave him the phone number.

## 2019-05-14 NOTE — ED Notes (Signed)
Pt has not eaten for several days she lives alone no pain anywhere

## 2019-05-14 NOTE — ED Notes (Signed)
[facility] at bedside to transport pt

## 2019-05-14 NOTE — H&P (Signed)
History and Physical    Betty [name] JSH:[id number] DOB: 1946/06/08 DOA: 05/13/2019  PCP: Josetta Huddle, MD Consultants:  Tamala Julian - cardiology; [facility] - nephrology; Mohamed - oncology Patient coming from:  Home - lives alone; [place]: Son, [phone]  Chief Complaint: AMS  HPI: Betty [name] is a 73 y.o. female with medical history significant of tobacco dependence; CAD; non-small cell lung CA (2016); HTN; HLD: dementia; COPD; stage IV CKD; and chronic combined CHF presenting with AMS.  She is a suboptimal historian and reports not feeling well but otherwise doesn't elucidate her symptoms well.  She denies SOB but is hypoxic to 81% when the [place] is not directly in her nostrils.  Denies cough.  Denies fever.  She reports that the "little girl across the street" (a 74yo woman) has COVID, but denies contact and is uncertain how she got it.  HPI per PA Sanders:  Son is at bedside and is providing majority of history. He states patient lives alone, generally functions fine on her own. He does call her every evening around 9:30PM to make sure she takes her medicine and has eaten the dinner he brought to her that evening. States today he called as usual and states he had to call back several times before he got an answer. When she did answer she stated that she was laying in the floor and could not get up. He drove over and found her on the floor in the bedroom, house was completely dark with no lights on. She does not remember falling, how she fell, or laying in the floor. He states since he has been with her she has seemed very lethargic and not quite herself. He states he has walked with her to the bathroom and it seems to "take all the energy she has" just to stand up and walk. States generally she is able to walk around and carry on conversation normally. Son is not aware of sick contacts or known COVID exposures, but does report neighbor was recently tested for [place] and unsure if they have been in contact  with one another. He also recently took her to hair salon as well.  ED Course: [place]ry[place], per Dr. Alcario Drought:   73 year old female with history of hypertension early dementia was found to be confused by patient's son and was lying on the floor. Was brought to the ER. In ER patient still appears confused CT head shows stable aneurysm. Chest x-ray unremarkable patient afebrile but COVID-19 test came positive and patient is hypoxic and will probably need admission.  Review of Systems: As per HPI; otherwise review of systems reviewed and negative.  Limited by cognitive status.   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 ([place])    Acute mastitis of right breast 10/2003   Arthritis    bursitis in right shoulder (05/21/2018)   Cerebral aneurysm, nonruptured 07/16/2018   Right anterior communicating artery   CKD (chronic kidney disease), stage IV (HCC)    Colon polyps    COPD (chronic obstructive pulmonary disease) ([place])    emphysema   Dementia ([place]) 07/16/2018   Family history of breast cancer    GERD (gastroesophageal reflux disease)    Hypercholesterolemia    Hypertension    Non-small cell carcinoma of right lung, stage 1 ([place]) 05/17/2015   Stage IB, right upper lobectomy 04/29/15, chemo    NSTEMI (non-ST elevated myocardial infarction) ([place]) 03/24/2018   Presence of permanent cardiac pacemaker  05/21/2018   Pure hypercholesterolemia    Sciatica of right side    Seasonal allergic rhinitis    Tobacco dependence    Tubular adenoma of colon    Vitamin D deficiency     Past Surgical History:  Procedure Laterality Date   BIV PACEMAKER INSERTION CRT-P N/A 05/21/2018   Procedure: BIV PACEMAKER INSERTION CRT-P;  Surgeon: Deboraha Sprang, MD;  Location: [place] [facility];  Service: Cardiovascular;  Laterality: N/A;   BREAST SURGERY     small mass removed from left breast--benign   CRYO INTERCOSTAL NERVE BLOCK  Right 04/29/2015   Procedure: CRYO INTERCOSTAL NERVE BLOCK;  Surgeon: [name], MD;  Location: [place];  Service: Thoracic;  Laterality: Right;   INSERT / REPLACE / REMOVE PACEMAKER  05/21/2018   LOBECTOMY Right 04/29/2015   Procedure: RIGHT LOWER LUNG LOBECTOMY ;  Surgeon: [name], MD;  Location: [place];  Service: Thoracic;  Laterality: Right;   LYMPH NODE DISSECTION Right 04/29/2015   Procedure: RIGHT LUNG LYMPH NODE DISSECTION;  Surgeon: [name], MD;  Location: [place];  Service: Thoracic;  Laterality: Right;   RIGHT/LEFT HEART CATH AND CORONARY ANGIOGRAPHY N/A 04/24/2018   Procedure: RIGHT/LEFT HEART CATH AND CORONARY ANGIOGRAPHY;  Surgeon: [name], MD;  Location: [place] [facility];  Service: Cardiovascular;  Laterality: N/A;   VAGINAL DELIVERY     51 yrs ago   VIDEO ASSISTED THORACOSCOPY (VATS)/ LOBECTOMY Right 04/29/2015   Procedure: RIGHT VIDEO ASSISTED THORACOSCOPY (VATS) WEDGE RESECTION/ RIGHT LOWER LOBECTOMY, CRYO-ANALGESIA OF INTERCOSTAL NERVES;  Surgeon: [name], MD;  Location: [facility];  Service: Thoracic;  Laterality: Right;    Social History   Socioeconomic History   Marital status: Single    Spouse name: Not on file   Number of children: 1   Years of education: 13   Highest education level: Not on file  Occupational History   Occupation: Retired  Scientist, product/process development strain: Not hard at [employer] insecurity    Worry: Patient refused    Inability: Patient refused   Transportation needs    Medical: Patient refused    Non-medical: Patient refused  Tobacco Use   Smoking status: Former Smoker    Packs/day: 0.75    Years: 47.00    Pack years: 35.25    Types: Cigarettes    Quit date: 04/29/2015    Years since quitting: 4.0   Smokeless tobacco: Never Used  Substance and Sexual Activity   Alcohol use: Yes    Comment: 05/21/2018 "maybe 6 drinks/year"   Drug use: Never   Sexual activity:  Not on file  Lifestyle   Physical activity    Days per week: Patient refused    Minutes per session: Patient refused   Stress: Only a little  Relationships   Press photographer on phone: Patient refused    Gets together: Patient refused    Attends religious service: Patient refused    Active member of club or organization: Patient refused    Attends meetings of clubs or organizations: Patient refused    Relationship status: Patient refused   Intimate partner violence    Fear of current or ex partner: Patient refused    Emotionally abused: Patient refused    Physically abused: Patient refused    Forced sexual activity: Patient refused  Other Topics Concern   Not on file  Social History Narrative   Lives alone  Caffeine use: 1 cup coffee per day   Right handed     Allergies  Allergen Reactions   Sulfa Antibiotics Itching    Family History  Problem Relation Age of Onset   Cancer Father    Cancer Sister    Heart attack Brother    Dementia Brother    Other Son        bicuspid aortic valve   Heart attack Brother    Cancer Sister     Prior to Admission medications   Medication Sig Start Date End Date Taking? Authorizing Provider  acetaminophen (TYLENOL) 325 MG tablet Take 650 mg by mouth every 6 (six) hours as needed for headache (pain).    Yes [provider]  aspirin EC 81 MG tablet Take 81 mg by mouth at bedtime.   Yes [provider]  atorvastatin (LIPITOR) 40 MG tablet TAKE 1 TABLET BY MOUTH ONCE DAILY AT 6 PM Patient taking differently: Take 40 mg by mouth daily at 6 PM.  03/27/19  Yes Belva Crome, MD  Cholecalciferol (VITAMIN D) 2000 units tablet Take 2,000 Units by mouth at bedtime.   Yes [provider]  donepezil (ARICEPT) 10 MG tablet Take 1 tablet (10 mg total) by mouth at bedtime. 03/23/19  Yes Suzzanne Cloud, NP  febuxostat (ULORIC) 40 MG tablet Take 40 mg by mouth daily.   Yes [provider]    furosemide (LASIX) 40 MG tablet Take 20 mg by mouth daily.    Yes [provider]  isosorbide-hydrALAZINE (BIDIL) 20-37.5 MG tablet Take 1 tablet by mouth 2 (two) times daily. 08/26/18  Yes Deboraha Sprang, MD  metoprolol succinate (TOPROL-XL) 100 MG 24 hr tablet Take 1 tablet (100 mg total) by mouth daily. Pt must keep upcoming appt in August for further refills. Thanks 04/07/19  Yes Belva Crome, MD  Multiple Vitamin (MULTIVITAMIN WITH MINERALS) TABS tablet Take 0.5 tablets by mouth at bedtime.   Yes [provider]  nitroGLYCERIN (NITROSTAT) 0.4 MG SL tablet Place 1 tablet (0.4 mg total) under the tongue every 5 (five) minutes x 3 doses as needed for chest pain. 04/26/18  Yes Daune Perch, NP  predniSONE (DELTASONE) 10 MG tablet Take 20 mg by mouth daily. 05/07/19  Yes [provider]    Physical Exam: Vitals:   05/14/19 0900 05/14/19 0915 05/14/19 0930 05/14/19 1000  BP: (!) 145/78  (!) 146/77 140/77  Pulse: 76 75  76  Resp:      Temp:      TempSrc:      SpO2: 95% 96%  90%  Weight:      Height:          General:  Appears calm and comfortable and is NAD; when [place] O2 was not in her nares, O2 sats with good pleth were consistently 81%  Eyes:  PERRL, EOMI, normal lids, iris  ENT:  grossly normal hearing, lips & tongue, mmm  Neck:  no LAD, masses or thyromegaly  Cardiovascular:  RRR, no m/r/g. No LE edema.   Respiratory:   CTA bilaterally with no wheezes/rales/rhonchi.  Normal respiratory effort.  Abdomen:  soft, NT, ND, NABS  Skin:  no rash or induration seen on limited exam  Musculoskeletal:  grossly normal tone BUE/BLE, good ROM, no bony abnormality  Lower extremity:  No LE edema.  Limited foot exam with no ulcerations.  2+ distal pulses.  Psychiatric:  grossly normal mood and affect, speech fluent and appropriate, AOx  1-2  Neurologic:  CN 2-12 grossly intact, moves all extremities in coordinated fashion, sensation intact    Radiological  Exams on Admission: Dg Chest 2 View  Result Date: 05/14/2019 CLINICAL DATA:  Altered mental status. EXAM: CHEST - 2 VIEW COMPARISON:  One-view chest x-ray 05/22/2018 FINDINGS: Heart is enlarged. Atherosclerotic calcifications are present at the aortic arch. There is no edema or effusion. Pacing wires are stable. Lungs are hyperinflated. Mild bibasilar densities are present. IMPRESSION: 1. Stable cardiomegaly without failure. 2. Acute cardiopulmonary disease. 3. Stable changes of emphysema. 4. Bibasilar opacities likely reflect atelectasis or scarring. Electronically Signed   By: San Morelle M.D.   On: 05/14/2019 04:38   Ct Head Wo Contrast  Result Date: 05/14/2019 CLINICAL DATA:  Altered mental status.  Known cerebral aneurysm. EXAM: CT HEAD WITHOUT CONTRAST TECHNIQUE: Contiguous axial images were obtained from the base of the skull through the vertex without intravenous contrast. COMPARISON:  Next MRI brain 05/25/2018 FINDINGS: Brain: No acute infarct, hemorrhage, or mass lesion is present. Periventricular and subcortical white matter changes bilaterally are stable. The ventricles are proportionate to the degree of atrophy. The brainstem and cerebellum are within normal limits. No significant extraaxial fluid collection is present. 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 Aneurysm measures 8 mm. There is no evidence for rupture. Vascular calcifications are again seen at the cavernous internal carotid arteries and at the dural margin of the left vertebral artery. There is no hyperdense vessel. Skull: Calvarium is intact. No focal lytic or blastic lesions are present. No significant extracranial soft tissue lesions are present. Sinuses/Orbits: The paranasal sinuses and mastoid air cells are clear. The globes and orbits are within normal limits. IMPRESSION: 1. Stable atrophy and white matter disease. 2. No acute intracranial abnormality. 3. Stable right MCA bifurcation aneurysm without  evidence for rupture. Electronically Signed   By: San Morelle M.D.   On: 05/14/2019 05:11    EKG: Independently reviewed.  Atrial-sensed ventricular rhythm, rate 67; NSCSLT  Labs on Admission: I have personally reviewed the available labs and imaging studies at the time of the admission.  Pertinent labs:   CO2 17 Glucose 157 BUN 60/Creatinine 3.27/GFR 15; 28/2.34/23 on 7/29 Anion gap 17 Albumin 3.4 BNP 177.4 CK 189 Normal CBC COVID POSITIVE   Assessment/Plan Principal Problem:   COVID-19 virus infection Active Problems:   Non-small cell carcinoma of right lung, stage 1 (HCC)   CKD (chronic kidney disease), stage IV (HCC)   Chronic systolic heart failure (HCC)   Dementia (HCC)   Essential hypertension   Acute respiratory failure with hypoxia with COVID-19 Infection -Patient with presenting with  AMS and weakness at home -Hypoxic in the ER - she was at 81% with [place]iz not effectively in place when I entered the room, and she improved to 90-94% on 3-4L [place] O2 -COVID POSITIVE - uncertain how she was exposed (her son is being tested and is self-quarantining; her neighbor has COVID but she denies interaction) -The patient has comorbidities which may increase the risk for ARDS/MODS including: age, HTN, COPD, immunosuppression -Exam is concerning for development of ARDS/MODS due to respiratory distress -Pertinent labs concerning for COVID include normal WBC count; worsening BUN/Creatinine; other labs are pending -CXR with bibasilar opacities which may be c/w COVID vs. Atelectasis or scarring -Will admit to [facility] for further evaluation, close monitoring, and treatment -Monitor on telemetry x at least 24 hours -At this time, will attempt to avoid use of aerosolized medications and use HFAs instead -  Will check daily labs including BMP with Mag, Phos; LFTs; CBC with differential; CRP; ferritin; fibrinogen; D-dimer -Will order steroids (1 mg/kg BID) and Remdesivir (pharmacy consult)  given +COVID test, +CXR, and hypoxia <94% on room air -If the patient shows clinical deterioration, consider transfer to ICU with PCCM consultation -Since the patient is hypoxic or on >3L oxygen from baseline or CRP >7, considerTocilizumab 8 mg/kg x1 IF + COVID test; O2 sats <88% on room air; and patient is at high risk for intubation.  Will defer to [facility] doctors regarding this medication since it is being used off label. -Will attempt to maintain euvolemia to a net negative fluid status -Will ask the patient to maintain an awake prone position for 16+ hours a day, if possible, with a minimum of 2-3 hours at a time -If D-dimer <5, will use standard-dosed Lovenox for DVT prevention -Patient was seen wearing full PPE including: gown, gloves, head cover, N95, and face shield; donning and doffing was in compliance with current standards.  Dementia -Her son reports significant h/o delirium with hospitalizations and so this is clearly a concern -She may need facetime sessions with her son to help reorient her at [facility] -Continue Aricept  Stage IV CKD -Worsened compared to prior baseline -Suspect there is an AKI component associated with COVID -Will trend -Does not appear hypovolemic at this time and so will not give IVF  Chronic systolic CHF -0/17 echo showed improvement in EF from 15% to 30-35%  -CRT pacer is in place -Continue ASA, Lipitor, Bidil, Toprol XL -Hold Lasix for now  HTN -Continue Bidil, Toprol  NSCLC/COPD -s/p RLL lobectomy -Completed 4 cycles of adjuvant chemotherapy  -Lung cancer appears to remain in remission -She takes Prednisone 20 mg daily, presumably due to COPD -Generally, we have been using Solumedrol 1 mg/kg divided BID; however, given her chronic immunosuppression will increase to 1 mg/kg given BID    DVT prophylaxis:  Lovenox  Code Status:  Full - confirmed with patient/family Family Communication: None present; I spoke with the patient's son by  telephone. Disposition Plan:  Home once clinically improved Consults called: None  Admission status: Admit - It is my clinical opinion that admission to INPATIENT is reasonable and necessary because of the expectation that this patient will require hospital care that crosses at least 2 midnights to treat this condition based on the medical complexity of the problems presented.  Given the aforementioned information, the predictability of an adverse outcome is felt to be significant.       Karmen Bongo MD [facility]   How to contact the [facility] Attending or Consulting provider [place] or covering provider during after hours [place], for this patient?  1. Check the care team in [facility] and look for a) attending/consulting [facility] provider listed and b) the [facility] team listed 2. Log into [email or web address] and use [place]'s universal password to access. If you do not have the password, please contact the hospital operator. 3. Locate the [facility] provider you are looking for under [facility] and page to a number that you can be directly reached. 4. If you still have difficulty reaching the provider, please page the [facility] (Director on Call) for the Hospitalists listed on amion for assistance.   05/14/2019, 11:08 AM

## 2019-05-14 NOTE — ED Provider Notes (Signed)
[facility] EMERGENCY DEPARTMENT Provider Note   CSN: [id number] Arrival date & time: 05/13/19  2245     History   Chief Complaint Chief Complaint  Patient presents with   Altered Mental Status    HPI Betty [name] is a 73 y.o. female.     The history is provided by medical records.  Altered Mental Status   LEVEL V CAVEAT:  DEMENTIA 73 year old female with history of CHF with EF of 30 to 35%, chronic kidney disease stage IV, COPD, late onset dementia, GERD, hypertension, hyperlipidemia, vitamin D deficiency, presenting to the ED for altered mental status.  Son is at bedside and is providing majority of history.  He states patient lives alone, generally functions fine on her own.  He does call her every evening around 9:30PM to make sure she takes her medicine and has eaten the dinner he brought to her that evening.  States today he called as usual and states he had to call back several times before he got an answer.  When she did answer she stated that she was laying in the floor and could not get up.  He drove over and found her on the floor in the bedroom, house was completely dark with no lights on.  She does not remember falling, how she fell, or laying in the floor.  He states since he has been with her she has seemed very lethargic and not quite herself.  He states he has walked with her to the bathroom and it seems to "take all the energy she has" just to stand up and walk.  States generally she is able to walk around and carry on conversation normally.  Son is not aware of sick contacts or known COVID exposures, but does report neighbor was recently tested for [place] and unsure if they have been in contact with one another.  He also recently took her to hair salon as well.  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   Colon polyps    COPD (chronic obstructive pulmonary disease) (HCC)    emphysema   Dementia ([place]) 07/16/2018   Family history of breast cancer    GERD (gastroesophageal reflux disease)    Hypercholesterolemia    Hypertension    Non-small cell carcinoma of right lung, stage 1 ([place]) 05/17/2015   Stage IB, right upper lobectomy 04/29/15    NSCL ca dx'd 03/2015   chemo; RL Lobectomy   NSTEMI (non-ST elevated myocardial infarction) ([place]) 03/24/2018   Presence of permanent cardiac pacemaker 05/21/2018   Pure hypercholesterolemia    Sciatica of right side    Seasonal allergic rhinitis    Tobacco dependence    Tubular adenoma of colon    Vitamin D deficiency     Patient Active Problem List   Diagnosis Date Noted   Dementia ([place]) 07/16/2018   Cerebral aneurysm, nonruptured 07/16/2018   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/2018   Congestive heart failure, NYHA class 3, chronic, systolic ([place]) 29/56/2130   Chronic systolic heart failure ([place])    Chest pain 04/22/2018  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   NSTEMI (non-ST elevated myocardial infarction) ([place]) 03/24/2018   Pure hypercholesterolemia    CKD (chronic kidney disease), stage IV (HCC)    Non-small cell carcinoma of right lung, stage 1 ([place]) 05/17/2015   S/P lobectomy of lung 04/29/2015    Past Surgical History:  Procedure Laterality Date   BIV PACEMAKER INSERTION CRT-P N/A 05/21/2018   Procedure: BIV PACEMAKER INSERTION CRT-P;  Surgeon: Deboraha Sprang, MD;  Location: [place] [facility];  Service: Cardiovascular;  Laterality: N/A;   BREAST SURGERY     small mass removed from left breast--benign   CRYO INTERCOSTAL NERVE  BLOCK Right 04/29/2015   Procedure: CRYO INTERCOSTAL NERVE BLOCK;  Surgeon: [name], MD;  Location: [place];  Service: Thoracic;  Laterality: Right;   INSERT / REPLACE / REMOVE PACEMAKER  05/21/2018   LOBECTOMY Right 04/29/2015   Procedure: RIGHT LOWER LUNG LOBECTOMY ;  Surgeon: [name], MD;  Location: [place];  Service: Thoracic;  Laterality: Right;   LYMPH NODE DISSECTION Right 04/29/2015   Procedure: RIGHT LUNG LYMPH NODE DISSECTION;  Surgeon: [name], MD;  Location: [place];  Service: Thoracic;  Laterality: Right;   RIGHT/LEFT HEART CATH AND CORONARY ANGIOGRAPHY N/A 04/24/2018   Procedure: RIGHT/LEFT HEART CATH AND CORONARY ANGIOGRAPHY;  Surgeon: [name], MD;  Location: [place] [facility];  Service: Cardiovascular;  Laterality: N/A;   VAGINAL DELIVERY     51 yrs ago   VIDEO ASSISTED THORACOSCOPY (VATS)/ LOBECTOMY Right 04/29/2015   Procedure: RIGHT VIDEO ASSISTED THORACOSCOPY (VATS) WEDGE RESECTION/ RIGHT LOWER LOBECTOMY, CRYO-ANALGESIA OF INTERCOSTAL NERVES;  Surgeon: [name], MD;  Location: [place];  Service: Thoracic;  Laterality: Right;     OB History   No obstetric history on file.      Home Medications    Prior to Admission medications   Medication Sig Start Date End Date Taking? Authorizing Provider  acetaminophen (TYLENOL) 325 MG tablet Take 650 mg by mouth every 6 (six) hours as needed for headache (pain).     [provider]  aspirin EC 81 MG tablet Take 81 mg by mouth at bedtime.    [provider]  atorvastatin (LIPITOR) 40 MG tablet TAKE 1 TABLET BY MOUTH ONCE DAILY AT 6 PM 03/27/19   Belva Crome, MD  Cholecalciferol (VITAMIN D) 2000 units tablet Take 2,000 Units by mouth at bedtime.    [provider]  donepezil (ARICEPT) 10 MG tablet Take 1 tablet (10 mg total) by mouth at bedtime. 03/23/19   Suzzanne Cloud, NP  furosemide (LASIX) 40 MG tablet Take 20 mg by mouth daily.     [provider]  isosorbide-hydrALAZINE (BIDIL) 20-37.5 MG tablet Take 1 tablet by mouth 2 (two) times daily. 08/26/18   Deboraha Sprang, MD  metoprolol succinate (TOPROL-XL) 100 MG 24 hr tablet Take 1 tablet (100 mg total) by mouth daily. Pt must keep upcoming appt in August for further refills. Thanks 04/07/19   Belva Crome, MD  Multiple Vitamin (MULTIVITAMIN WITH MINERALS) TABS tablet Take 0.5 tablets by mouth at bedtime.    [provider]  nitroGLYCERIN (NITROSTAT) 0.4 MG SL tablet Place 1 tablet (0.4 mg total) under the tongue every 5 (five) minutes x 3 doses as needed for chest pain. Patient not taking: Reported on 03/23/2019 04/26/18   Daune Perch, NP    Family History Family History  Problem Relation Age of Onset  Cancer Father    Cancer Sister    Heart attack Brother    Dementia Brother    Other Son        bicuspid aortic valve   Heart attack Brother    Cancer Sister     Social History Social History   Tobacco Use   Smoking status: Former Smoker    Packs/day: 0.75    Years: 47.00    Pack years: 35.25    Types: Cigarettes    Quit date: 04/29/2015    Years since quitting: 4.0   Smokeless tobacco: Never Used  Substance Use Topics   Alcohol use: Yes    Comment: 05/21/2018 "maybe 6 drinks/year"   Drug use: Never     Allergies   Sulfa antibiotics   Review of Systems Review of Systems  Unable to perform ROS: Dementia     Physical Exam Updated Vital Signs BP (!) 146/75 (BP Location: Right Arm)    Pulse 68    Temp 97.6 F (36.4 C) (Oral)    Resp 18    Ht 5\' 4"  (1.626 m)    Wt 57.2 kg    SpO2 (!) 84%    BMI 21.63 kg/m   Physical Exam Vitals signs and nursing note reviewed.  Constitutional:      Appearance: She is well-developed.     Comments: thin  HENT:     Head: Normocephalic and atraumatic.     Comments: No visible signs of head trauma Eyes:     Conjunctiva/sclera: Conjunctivae normal.     Pupils: Pupils are equal, round, and  reactive to light.  Neck:     Musculoskeletal: Normal range of motion.  Cardiovascular:     Rate and Rhythm: Normal rate and regular rhythm.     Heart sounds: Normal heart sounds.  Pulmonary:     Effort: Pulmonary effort is normal.     Breath sounds: Normal breath sounds. No wheezing or rhonchi.     Comments: Breathing heavy on 2L supplemental O2, intermittent dry cough, lungs overall clear Abdominal:     General: Bowel sounds are normal.     Palpations: Abdomen is soft.  Musculoskeletal: Normal range of motion.  Skin:    General: Skin is warm and dry.  Neurological:     Comments: Sleepy and drowsy appearing, does arouse when spoken to and able to follow commands, confused regarding events of last evening, date, and situation; aware of self and son at bedside      ED Treatments / Results  Labs (all labs ordered are listed, but only abnormal results are displayed) Labs Reviewed  SARS CORONAVIRUS 2 ([facility]) - Abnormal; Notable for the following components:      Result Value   SARS Coronavirus 2 POSITIVE (*)    All other components within normal limits  COMPREHENSIVE METABOLIC PANEL - Abnormal; Notable for the following components:   CO2 17 (*)    Glucose, Bld 157 (*)    BUN 60 (*)    Creatinine, Ser 3.27 (*)    Albumin 3.4 (*)    GFR calc non Af Amer 13 (*)    GFR calc Af Amer 15 (*)    Anion gap 17 (*)    All other components within normal limits  BRAIN NATRIURETIC PEPTIDE - Abnormal; Notable for the following components:   B Natriuretic Peptide 177.4 (*)    All other components within normal limits  CBG MONITORING, ED - Abnormal;  Notable for the following components:   Glucose-Capillary 160 (*)    All other components within normal limits  URINE CULTURE  CBC  CK  URINALYSIS, ROUTINE W REFLEX MICROSCOPIC    EKG EKG Interpretation  Date/Time:  Wednesday May 13 2019 23:11:25 EDT Ventricular Rate:  67 PR  Interval:  126 QRS Duration: 162 QT Interval:  548 QTC Calculation: 579 R Axis:   133 Text Interpretation:  Atrial-sensed ventricular-paced rhythm Abnormal ECG No acute changes No significant change since last tracing Confirmed by Varney Biles [phone]) on 05/14/2019 5:41:45 AM   Radiology Dg Chest 2 View  Result Date: 05/14/2019 CLINICAL DATA:  Altered mental status. EXAM: CHEST - 2 VIEW COMPARISON:  One-view chest x-ray 05/22/2018 FINDINGS: Heart is enlarged. Atherosclerotic calcifications are present at the aortic arch. There is no edema or effusion. Pacing wires are stable. Lungs are hyperinflated. Mild bibasilar densities are present. IMPRESSION: 1. Stable cardiomegaly without failure. 2. Acute cardiopulmonary disease. 3. Stable changes of emphysema. 4. Bibasilar opacities likely reflect atelectasis or scarring. Electronically Signed   By: San Morelle M.D.   On: 05/14/2019 04:38   Ct Head Wo Contrast  Result Date: 05/14/2019 CLINICAL DATA:  Altered mental status.  Known cerebral aneurysm. EXAM: CT HEAD WITHOUT CONTRAST TECHNIQUE: Contiguous axial images were obtained from the base of the skull through the vertex without intravenous contrast. COMPARISON:  Next MRI brain 05/25/2018 FINDINGS: Brain: No acute infarct, hemorrhage, or mass lesion is present. Periventricular and subcortical white matter changes bilaterally are stable. The ventricles are proportionate to the degree of atrophy. The brainstem and cerebellum are within normal limits. No significant extraaxial fluid collection is present. 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 Aneurysm measures 8 mm. There is no evidence for rupture. Vascular calcifications are again seen at the cavernous internal carotid arteries and at the dural margin of the left vertebral artery. There is no hyperdense vessel. Skull: Calvarium is intact. No focal lytic or blastic lesions are present. No significant extracranial soft tissue lesions  are present. Sinuses/Orbits: The paranasal sinuses and mastoid air cells are clear. The globes and orbits are within normal limits. IMPRESSION: 1. Stable atrophy and white matter disease. 2. No acute intracranial abnormality. 3. 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 Electronically Signed   By: San Morelle M.D.   On: 05/14/2019 05:11    Procedures Procedures (including critical care time)  CRITICAL CARE Performed by: Larene Pickett   Total critical care time: 35 minutes  Critical care time was exclusive of separately billable procedures and treating other patients.  Critical care was necessary to treat or prevent imminent or life-threatening deterioration.  Critical care was time spent personally by me on the following activities: development of treatment plan with patient and/or surrogate as well as nursing, discussions with consultants, evaluation of patient's response to treatment, examination of patient, obtaining history from patient or surrogate, ordering and performing treatments and interventions, ordering and review of laboratory studies, ordering and review of radiographic studies, pulse oximetry and re-evaluation of patient's condition.   Medications Ordered in ED Medications  sodium chloride flush (NS) 0.9 % injection 3 mL (has no administration in time range)  sodium chloride 0.9 % bolus 500 mL (has no administration in time range)     Initial Impression / Assessment and Plan / ED Course  I have reviewed the triage vital signs and the nursing notes.  Pertinent labs & imaging results that were available during my care of  the patient were reviewed by me and considered in my medical decision making (see chart for details).  73 year old female here with altered mental status.  She has history of dementia majority of history is provided by her son.  Patient lives alone, son brings her dinner every evening and call to check on her around  9:30 PM to remind her to take her meds.  He got concerned today when he called and she did not answer.  He went to her house and found her lying on the floor in her bedroom in the dark.  She does not remember falling, how she fell, or being in the floor.  He states she did not touch any of the meals he has brought her for the past 3 days.  Here she does appear somewhat drowsy but will arouse, answer questions, and follow commands.  She is noted to be hypoxic down to 84% on room air so started on 3 L supplemental oxygen.  Screening labs are overall reassuring.  Creatinine is 3.27, however has known history of stage IV chronic kidney disease.  She was given gentle IV hydration given obvious poor intake recently.  Given her fall and known history of aneurysm, will also obtain CK, CT of the head, chest x-ray.  Son reports neighbor is currently being tested for COVID, unknown results.  He is not sure if they have interacted with the father.  He also took her to the beauty shop recently to get her hair done.  Will get COVID screen.  Patient's CK is normal.  CT of the head without acute findings, stable aneurysm without evidence of rupture or hemorrhage.  Chest x-ray is clear.  Her COVID screen is positive.  I suspect this is likely etiology.  Given she continues to be hypoxic and requiring supplemental oxygen, she will need admission to [facility].  Discussed with Dr. Marvetta Gibbons team to evaluate and admit.  Final Clinical Impressions(s) / ED Diagnoses   Final diagnoses:  COVID-19  Altered mental status, unspecified altered mental status type    ED Discharge Orders    None       Larene Pickett, PA-C 05/14/19 [id number]    Varney Biles, MD 05/14/19 [phone]

## 2019-05-15 ENCOUNTER — Inpatient Hospital Stay (HOSPITAL_COMMUNITY): Payer: Medicare Other

## 2019-05-15 DIAGNOSIS — I5022 Chronic systolic (congestive) heart failure: Secondary | ICD-10-CM

## 2019-05-15 DIAGNOSIS — I10 Essential (primary) hypertension: Secondary | ICD-10-CM

## 2019-05-15 DIAGNOSIS — N184 Chronic kidney disease, stage 4 (severe): Secondary | ICD-10-CM

## 2019-05-15 DIAGNOSIS — U071 COVID-19: Secondary | ICD-10-CM

## 2019-05-15 LAB — MAGNESIUM
Magnesium: 2.3 mg/dL (ref 1.7–2.4)
Magnesium: 2.3 mg/dL (ref 1.7–2.4)

## 2019-05-15 LAB — POCT I-STAT 7, (LYTES, BLD GAS, ICA,H+H)
Acid-base deficit: 3 mmol/L — ABNORMAL HIGH (ref 0.0–2.0)
Bicarbonate: 18.5 mmol/L — ABNORMAL LOW (ref 20.0–28.0)
Bicarbonate: 23.7 mmol/L (ref 20.0–28.0)
Calcium, Ion: 1.22 mmol/L (ref 1.15–1.40)
Calcium, Ion: 1.23 mmol/L (ref 1.15–1.40)
HCT: 33 % — ABNORMAL LOW (ref 36.0–46.0)
HCT: 34 % — ABNORMAL LOW (ref 36.0–46.0)
Hemoglobin: 11.2 g/dL — ABNORMAL LOW (ref 12.0–15.0)
Hemoglobin: 11.6 g/dL — ABNORMAL LOW (ref 12.0–15.0)
O2 Saturation: 89 %
O2 Saturation: 100 %
Patient temperature: 36.3
Patient temperature: 99
Potassium: 4.3 mmol/L (ref 3.5–5.1)
Potassium: 4.4 mmol/L (ref 3.5–5.1)
Sodium: 139 mmol/L (ref 135–145)
Sodium: 140 mmol/L (ref 135–145)
TCO2: 19 mmol/L — ABNORMAL LOW (ref 22–32)
TCO2: 25 mmol/L (ref 22–32)
pCO2 arterial: 21.6 mmHg — ABNORMAL LOW (ref 32.0–48.0)
pCO2 arterial: 35 mmHg (ref 32.0–48.0)
pH, Arterial: 7.538 — ABNORMAL HIGH (ref 7.350–7.450)
pH, Arterial: 7.44 (ref 7.350–7.450)
pO2, Arterial: 45 mmHg — ABNORMAL LOW (ref 83.0–108.0)
pO2, Arterial: 447 mmHg — ABNORMAL HIGH (ref 83.0–108.0)

## 2019-05-15 LAB — COMPREHENSIVE METABOLIC PANEL
ALT: 20 U/L (ref 0–44)
AST: 35 U/L (ref 15–41)
Albumin: 3.3 g/dL — ABNORMAL LOW (ref 3.5–5.0)
Alkaline Phosphatase: 65 U/L (ref 38–126)
Anion gap: 20 — ABNORMAL HIGH (ref 5–15)
BUN: 67 mg/dL — ABNORMAL HIGH (ref 8–23)
CO2: 17 mmol/L — ABNORMAL LOW (ref 22–32)
Calcium: 9.1 mg/dL (ref 8.9–10.3)
Chloride: 103 mmol/L (ref 98–111)
Creatinine, Ser: 2.93 mg/dL — ABNORMAL HIGH (ref 0.44–1.00)
GFR calc Af Amer: 18 mL/min — ABNORMAL LOW (ref >60)
GFR calc non Af Amer: 15 mL/min — ABNORMAL LOW (ref >60)
Glucose, Bld: 133 mg/dL — ABNORMAL HIGH (ref 70–99)
Potassium: 4.7 mmol/L (ref 3.5–5.1)
Sodium: 140 mmol/L (ref 135–145)
Total Bilirubin: 0.7 mg/dL (ref 0.3–1.2)
Total Protein: 7.9 g/dL (ref 6.5–8.1)

## 2019-05-15 LAB — GLUCOSE, CAPILLARY
Glucose-Capillary: 213 mg/dL — ABNORMAL HIGH (ref 70–99)
Glucose-Capillary: 231 mg/dL — ABNORMAL HIGH (ref 70–99)

## 2019-05-15 LAB — CBC
HCT: 38.2 % (ref 36.0–46.0)
Hemoglobin: 12.8 g/dL (ref 12.0–15.0)
MCH: 29.8 pg (ref 26.0–34.0)
MCHC: 33.5 g/dL (ref 30.0–36.0)
MCV: 89 fL (ref 80.0–100.0)
Platelets: 382 10*3/uL (ref 150–400)
RBC: 4.29 MIL/uL (ref 3.87–5.11)
RDW: 12.7 % (ref 11.5–15.5)
WBC: 6.8 10*3/uL (ref 4.0–10.5)
nRBC: 0 % (ref 0.0–0.2)

## 2019-05-15 LAB — LACTATE DEHYDROGENASE: LDH: 428 U/L — ABNORMAL HIGH (ref 98–192)

## 2019-05-15 LAB — FERRITIN: Ferritin: 416 ng/mL — ABNORMAL HIGH (ref 11–307)

## 2019-05-15 LAB — D-DIMER, QUANTITATIVE: D-Dimer, Quant: 3 ug/mL-FEU — ABNORMAL HIGH (ref 0.00–0.50)

## 2019-05-15 LAB — PHOSPHORUS
Phosphorus: 6.1 mg/dL — ABNORMAL HIGH (ref 2.5–4.6)
Phosphorus: 5.4 mg/dL — ABNORMAL HIGH (ref 2.5–4.6)

## 2019-05-15 LAB — C-REACTIVE PROTEIN: CRP: 13.7 mg/dL — ABNORMAL HIGH (ref <1.0)

## 2019-05-15 MED ORDER — FENTANYL BOLUS VIA INFUSION
25.0000 ug | INTRAVENOUS | Status: DC | PRN
  Administered 2019-05-15 – 2019-05-16 (×6): 25 ug via INTRAVENOUS
  Filled 2019-05-15: qty 25

## 2019-05-15 MED ORDER — PRO-STAT SUGAR FREE PO LIQD
30.0000 mL | Freq: Two times a day (BID) | ORAL | Status: DC
  Administered 2019-05-15 – 2019-05-16 (×2): 30 mL
  Filled 2019-05-15 (×2): qty 30

## 2019-05-15 MED ORDER — MIDAZOLAM HCL 2 MG/2ML IJ SOLN
1.0000 mg | INTRAMUSCULAR | Status: DC | PRN
  Administered 2019-05-15: 1 mg via INTRAVENOUS
  Filled 2019-05-15: qty 2

## 2019-05-15 MED ORDER — INSULIN ASPART 100 UNIT/ML ~~LOC~~ SOLN: 0.0000 [IU] | Freq: Three times a day (TID) | SUBCUTANEOUS | Status: DC

## 2019-05-15 MED ORDER — DOCUSATE SODIUM 50 MG/5ML PO LIQD
100.0000 mg | Freq: Two times a day (BID) | ORAL | Status: DC
  Administered 2019-05-15 – 2019-06-06 (×22): 100 mg via ORAL
  Filled 2019-05-15 (×30): qty 10

## 2019-05-15 MED ORDER — INSULIN ASPART 100 UNIT/ML ~~LOC~~ SOLN
0.0000 [IU] | Freq: Every day | SUBCUTANEOUS | Status: DC
  Administered 2019-05-15: 2 [IU] via SUBCUTANEOUS

## 2019-05-15 MED ORDER — FENTANYL CITRATE (PF) 100 MCG/2ML IJ SOLN: 25.0000 ug | Freq: Once | INTRAMUSCULAR | Status: AC

## 2019-05-15 MED ORDER — FUROSEMIDE 10 MG/ML IJ SOLN
40.0000 mg | Freq: Once | INTRAMUSCULAR | Status: AC
  Administered 2019-05-15: 40 mg via INTRAVENOUS
  Filled 2019-05-15: qty 4

## 2019-05-15 MED ORDER — ORAL CARE MOUTH RINSE
15.0000 mL | OROMUCOSAL | Status: DC
  Administered 2019-05-15 – 2019-05-18 (×25): 15 mL via OROMUCOSAL

## 2019-05-15 MED ORDER — VITAMIN C 500 MG PO TABS
500.0000 mg | ORAL_TABLET | Freq: Every day | ORAL | Status: DC
  Administered 2019-05-15 – 2019-05-16 (×2): 500 mg via ORAL
  Filled 2019-05-15: qty 1

## 2019-05-15 MED ORDER — MIDAZOLAM HCL 2 MG/2ML IJ SOLN
1.0000 mg | INTRAMUSCULAR | Status: AC | PRN
  Administered 2019-05-15 – 2019-05-16 (×2): 1 mg via INTRAVENOUS
  Administered 2019-05-17: 0.5 mg via INTRAVENOUS
  Filled 2019-05-15 (×2): qty 2

## 2019-05-15 MED ORDER — PANTOPRAZOLE SODIUM 40 MG PO PACK
40.0000 mg | PACK | Freq: Every day | ORAL | Status: DC
  Administered 2019-05-16 – 2019-05-17 (×2): 40 mg
  Filled 2019-05-15 (×2): qty 20

## 2019-05-15 MED ORDER — MIDAZOLAM HCL 2 MG/2ML IJ SOLN
INTRAMUSCULAR | Status: AC
  Administered 2019-05-15: 19:00:00 2 mg
  Filled 2019-05-15: qty 2

## 2019-05-15 MED ORDER — TOCILIZUMAB 400 MG/20ML IV SOLN
400.0000 mg | Freq: Once | INTRAVENOUS | Status: AC
  Administered 2019-05-15: 12:00:00 400 mg via INTRAVENOUS
  Filled 2019-05-15: qty 20

## 2019-05-15 MED ORDER — ROCURONIUM BROMIDE 10 MG/ML (PF) SYRINGE
PREFILLED_SYRINGE | INTRAVENOUS | Status: AC
  Administered 2019-05-15: 60 mg
  Filled 2019-05-15: qty 10

## 2019-05-15 MED ORDER — CHLORHEXIDINE GLUCONATE 0.12% ORAL RINSE (MEDLINE KIT)
15.0000 mL | Freq: Two times a day (BID) | OROMUCOSAL | Status: DC
  Administered 2019-05-15 – 2019-05-17 (×4): 15 mL via OROMUCOSAL

## 2019-05-15 MED ORDER — ETOMIDATE 2 MG/ML IV SOLN
INTRAVENOUS | Status: AC
  Administered 2019-05-15: 19:00:00 20 mg
  Filled 2019-05-15: qty 10

## 2019-05-15 MED ORDER — FENTANYL 2500MCG IN NS 250ML (10MCG/ML) PREMIX INFUSION
25.0000 ug/h | INTRAVENOUS | Status: DC
  Administered 2019-05-15: 20:00:00 50 ug/h via INTRAVENOUS
  Administered 2019-05-16: 07:00:00 200 ug/h via INTRAVENOUS
  Filled 2019-05-15 (×2): qty 250

## 2019-05-15 MED ORDER — FENTANYL CITRATE (PF) 100 MCG/2ML IJ SOLN
INTRAMUSCULAR | Status: AC
  Administered 2019-05-15: 100 ug
  Filled 2019-05-15: qty 2

## 2019-05-15 MED ORDER — ZINC SULFATE 220 (50 ZN) MG PO CAPS
220.0000 mg | ORAL_CAPSULE | Freq: Every day | ORAL | Status: DC
  Administered 2019-05-15 – 2019-05-16 (×2): 220 mg via ORAL
  Filled 2019-05-15: qty 1

## 2019-05-15 MED ORDER — MIDAZOLAM HCL 2 MG/2ML IJ SOLN
1.0000 mg | INTRAMUSCULAR | Status: DC | PRN
  Administered 2019-05-15 – 2019-05-16 (×4): 1 mg via INTRAVENOUS
  Administered 2019-05-16 – 2019-05-18 (×3): 2 mg via INTRAVENOUS
  Filled 2019-05-15 (×7): qty 2

## 2019-05-15 MED ORDER — VITAL HIGH PROTEIN PO LIQD
1000.0000 mL | ORAL | Status: DC
  Administered 2019-05-15: 1000 mL
  Filled 2019-05-15: qty 1000

## 2019-05-15 NOTE — Consult Note (Signed)
NAME:  Betty [name], MRN:  [id number], DOB:  [birth year], LOS: 1 ADMISSION DATE:  05/13/2019, CONSULTATION DATE:  7/10 REFERRING MD:  Cruzita Lederer, CHIEF COMPLAINT:  Dyspnea   Brief History   73 y/o female with multiple medical problems admitted on 7/9 with worsening dyspnea in setting of severe acute respiratory failure with hypoxemia due to COVID 19.   History of present illness   74 y/o female with multiple medical problems was admitted on 7/9 with dyspnea due to covid 19 pneumonia.  She received treatment overnight but her dyspnea and hypoxemia worsned on 7/10 and required transfer to the ICU.  She has extreme respiratory distress and is unable to cooperate with history taking due to the severity of dyspnea.  History obtained by chart review.  Past Medical History  COPD NSCLC 3532 CKD Systolic heart failure Hypertension  Significant Hospital Events   7/9 admission 7/10 transfer to ICU  Consults:  PCCM  Procedures:  7/10 ETT >   Significant Diagnostic Tests:  04/2019 LVEF 30-35%, LVH, LV hypokinesis, RV normal function, minimal valve insufficiencies  Micro Data:  7/9 SARS COV2 >  7/9 Urine culture >   Antimicrobials:  7/9 remdesivir >  7/9 solumedrol >    Interim history/subjective:  As above  Objective   Blood pressure (!) 157/74, pulse 76, temperature (!) 97.4 F (36.3 C), temperature source Axillary, resp. rate (!) 30, height 5\' 4"  (1.626 m), weight 57.2 kg, SpO2 97 %.        Intake/Output Summary (Last 24 hours) at 05/15/2019 1859 Last data filed at 05/14/2019 2230 Gross per 24 hour  Intake 3 ml  Output -  Net 3 ml   Filed Weights   05/13/19 2253  Weight: 57.2 kg    Examination:  General: severe respiratory distress HENT: NCAT OP clear PULM: Crackles bilaterally B, normal effort CV: RRR, no mgr GI: BS+, soft, nontender MSK: normal bulk and tone Neuro: awake, alert, no distress, MAEW   Resolved Hospital Problem list     Assessment & Plan:  Severe  acute respiratory failure with hypoxemia due to COVID 19 pneumonia Intubate now ARDS protocol, target Tvol 6-8cc/kg IBW, Driving pressure < 99MEQ68, Plateau < 30cm VAP prevention Check CVP Diurese if able Sedation: RASS target -2, Fentanyl continuous, versed PRN remdesivir Solumedrol Consider actemra  CHF Check CVP Diurese as able Continue metoprolol    Best practice:  Diet: start tube feeding Pain/Anxiety/Delirium protocol (if indicated): yes, fentanyl infusion RASS goal -2, pern versed VAP protocol (if indicated): yes DVT prophylaxis: lovenox, change to BID after reviewing renal function on 7/11 GI prophylaxis: pantoprazole Glucose control: per TRH Mobility: bed rest Code Status: full Family Communication: none bedside Disposition: remain in ICU  Labs   CBC: Recent Labs  Lab 05/13/19 2354 05/15/19 0400 05/15/19 1839  WBC 10.3 6.8  --   HGB 12.8 12.8 11.2*  HCT 39.0 38.2 33.0*  MCV 90.5 89.0  --   PLT 322 382  --     Basic Metabolic Panel: Recent Labs  Lab 05/13/19 2354 05/15/19 0400 05/15/19 1839  NA 137 140 139  K 4.4 4.7 4.3  CL 103 103  --   CO2 17* 17*  --   GLUCOSE 157* 133*  --   BUN 60* 67*  --   CREATININE 3.27* 2.93*  --   CALCIUM 9.0 9.1  --   MG  --  2.3  --   PHOS  --  6.1*  --  GFR: Estimated Creatinine Clearance: 14.5 mL/min (A) (by C-G formula based on SCr of 2.93 mg/dL (H)). Recent Labs  Lab 05/13/19 2354 05/14/19 1310 05/15/19 0400  PROCALCITON  --  0.59  --   WBC 10.3  --  6.8    Liver Function Tests: Recent Labs  Lab 05/13/19 2354 05/15/19 0400  AST 32 35  ALT 22 20  ALKPHOS 76 65  BILITOT 0.7 0.7  PROT 7.4 7.9  ALBUMIN 3.4* 3.3*   No results for input(s): LIPASE, AMYLASE in the last 168 hours. No results for input(s): AMMONIA in the last 168 hours.  ABG    Component Value Date/Time   PHART 7.538 (H) 05/15/2019 1839   PCO2ART 21.6 (L) 05/15/2019 1839   PO2ART 45.0 (L) 05/15/2019 1839   HCO3 18.5 (L)  05/15/2019 1839   TCO2 19 (L) 05/15/2019 1839   ACIDBASEDEF 3.0 (H) 05/15/2019 1839   O2SAT 89.0 05/15/2019 1839     Coagulation Profile: No results for input(s): INR, PROTIME in the last 168 hours.  Cardiac Enzymes: Recent Labs  Lab 05/13/19 2354  CKTOTAL 189    HbA1C: No results found for: HGBA1C  CBG: Recent Labs  Lab 05/13/19 2307  GLUCAP 160*    Review of Systems:   Cannot obtain due to dyspnea  Past Medical History  She,  has a past medical history of Acute combined systolic and diastolic (congestive) hrt fail ([place]), Acute mastitis of right breast (10/2003), Arthritis, Cerebral aneurysm, nonruptured (07/16/2018), CKD (chronic kidney disease), stage IV ([place]), Colon polyps, COPD (chronic obstructive pulmonary disease) ([place]), Dementia ([place]) (07/16/2018), Family history of breast cancer, GERD (gastroesophageal reflux disease), Hypercholesterolemia, Hypertension, Non-small cell carcinoma of right lung, stage 1 ([place]) (05/17/2015), NSTEMI (non-ST elevated myocardial infarction) ([place]) (03/24/2018), Presence of permanent cardiac pacemaker (05/21/2018), Pure hypercholesterolemia, Sciatica of right side, Seasonal allergic rhinitis, Tobacco dependence, Tubular adenoma of colon, and Vitamin D deficiency.   Surgical History    Past Surgical History:  Procedure Laterality Date  . BIV PACEMAKER INSERTION CRT-P N/A 05/21/2018   Procedure: BIV PACEMAKER INSERTION CRT-P;  Surgeon: Deboraha Sprang, MD;  Location: [place] [facility];  Service: Cardiovascular;  Laterality: N/A;  . BREAST SURGERY     small mass removed from left breast--benign  . CRYO INTERCOSTAL NERVE BLOCK Right 04/29/2015   Procedure: CRYO INTERCOSTAL NERVE BLOCK;  Surgeon: [name], MD;  Location: [place];  Service: Thoracic;  Laterality: Right;  . INSERT / REPLACE / REMOVE PACEMAKER  05/21/2018  . LOBECTOMY Right 04/29/2015   Procedure: RIGHT LOWER LUNG LOBECTOMY ;  Surgeon: [name], MD;  Location: [place];  Service: Thoracic;  Laterality: Right;  . LYMPH NODE DISSECTION Right 04/29/2015   Procedure: RIGHT LUNG LYMPH NODE DISSECTION;  Surgeon: [name], MD;  Location: [facility];  Service: Thoracic;  Laterality: Right;  . RIGHT/LEFT HEART CATH AND CORONARY ANGIOGRAPHY N/A 04/24/2018   Procedure: RIGHT/LEFT HEART CATH AND CORONARY ANGIOGRAPHY;  Surgeon: [name], MD;  Location: [facility];  Service: Cardiovascular;  Laterality: N/A;  . VAGINAL DELIVERY     51 yrs ago  . VIDEO ASSISTED THORACOSCOPY (VATS)/ LOBECTOMY Right 04/29/2015   Procedure: RIGHT VIDEO ASSISTED THORACOSCOPY (VATS) WEDGE RESECTION/ RIGHT LOWER LOBECTOMY, CRYO-ANALGESIA OF INTERCOSTAL NERVES;  Surgeon: [name], MD;  Location: [place];  Service: Thoracic;  Laterality: Right;     Social History   reports that she quit smoking about 4 years ago. Her smoking use  included cigarettes. She has a 35.25 pack-year smoking history. She has never used smokeless tobacco. She reports current alcohol use. She reports that she does not use drugs.   Family History   Her family history includes Cancer in her father, sister, and sister; Dementia in her brother; Heart attack in her brother and brother; Other in her son.   Allergies Allergies  Allergen Reactions  . Sulfa Antibiotics Itching     Home Medications  Prior to Admission medications   Medication Sig Start Date End Date Taking? Authorizing Provider  acetaminophen (TYLENOL) 325 MG tablet Take 650 mg by mouth every 6 (six) hours as needed for headache (pain).    Yes [provider]  aspirin EC 81 MG tablet Take 81 mg by mouth at bedtime.   Yes [provider]  atorvastatin (LIPITOR) 40 MG tablet TAKE 1 TABLET BY MOUTH ONCE DAILY AT 6 PM Patient taking differently: Take 40 mg by mouth daily at 6 PM.  03/27/19  Yes Belva Crome, MD  Cholecalciferol (VITAMIN D) 2000 units tablet Take 2,000 Units by mouth at bedtime.   Yes [provider]  donepezil (ARICEPT) 10 MG tablet Take 1 tablet (10 mg total) by mouth at bedtime. 03/23/19  Yes Suzzanne Cloud, NP  febuxostat (ULORIC) 40 MG tablet Take 40 mg by mouth daily.   Yes [provider]  furosemide (LASIX) 40 MG tablet Take 20 mg by mouth daily.    Yes [provider]  isosorbide-hydrALAZINE (BIDIL) 20-37.5 MG tablet Take 1 tablet by mouth 2 (two) times daily. 08/26/18  Yes Deboraha Sprang, MD  metoprolol succinate (TOPROL-XL) 100 MG 24 hr tablet Take 1 tablet (100 mg total) by mouth daily. Pt must keep upcoming appt in August for further refills. Thanks 04/07/19  Yes Belva Crome, MD  Multiple Vitamin (MULTIVITAMIN WITH MINERALS) TABS tablet Take 0.5 tablets by mouth at bedtime.   Yes [provider]  nitroGLYCERIN (NITROSTAT) 0.4 MG SL tablet Place 1 tablet (0.4 mg total) under the tongue every 5 (five) minutes x 3 doses as needed for chest pain. 04/26/18  Yes Daune Perch, NP  predniSONE (DELTASONE) 10 MG tablet Take 20 mg by mouth daily. 05/07/19  Yes [provider]     Critical care time: 45 minutes    Roselie Awkward, MD [place] P[facility]M Pager: [phone] Cell: [phone] If no response, call [phone]

## 2019-05-15 NOTE — Progress Notes (Signed)
Found STAT ABG order on worklist went to do it and patient was on 10 L sat 83%.  PaO2 on ABG 46 charge ICU nurse with me and contacted MD to transfer patient.  She was grunting, RR 40's, unable to speak sentences.  Placed on 100% NRB and transferred where intubated upon arrival.

## 2019-05-15 NOTE — Procedures (Signed)
Central Venous Catheter Insertion Procedure Note Betty [name] [id number] 10/13/1946  Procedure: Insertion of Central Venous Catheter Indications: Assessment of intravascular volume  Procedure Details Consent: Risks of procedure as well as the alternatives and risks of each were explained to the (patient/caregiver).  Consent for procedure obtained. Time Out: Verified patient identification, verified procedure, site/side was marked, verified correct patient position, special equipment/implants available, medications/allergies/relevent history reviewed, required imaging and test results available.  Performed  Maximum sterile technique was used including antiseptics, cap, gloves, gown, hand hygiene, mask and sheet. Skin prep: Chlorhexidine; local anesthetic administered A antimicrobial bonded/coated triple lumen catheter was placed in the right internal jugular vein using the Seldinger technique.  Ultrasound was used to verify the patency of the vein and for real time needle guidance.  Evaluation Blood flow good Complications: No apparent complications Patient did tolerate procedure well. Chest X-ray ordered to verify placement.  CXR: pending.  Simonne Maffucci 05/15/2019, 7:42 PM

## 2019-05-15 NOTE — Progress Notes (Signed)
PROGRESS NOTE  Betty [name] LOV:[id number] DOB: 06/20/1946 DOA: 05/13/2019 PCP: Josetta Huddle, MD   LOS: 1 day   Brief Narrative / Interim history: 73 year old female with history of coronary artery disease, non-small cell lung cancer in 2016, hypertension, hyperlipidemia, dementia, COPD, chronic kidney disease stage IV, chronic combined CHF who was brought to the hospital by the patient's son due to increased confusion and weakness.  She was diagnosed with coronavirus in the ED on 7/9.  She was also found to be hypoxic requiring 2 L nasal cannula.  She was admitted to the hospital and placed on steroids along with Remdesivir.  Subjective: Patient seen this morning, eating breakfast, does complain of slightly worsening shortness of breath compared to yesterday.  She answers orientation questions and know why she is in the hospital however she is easily forgetful and repeats questions.  Denies any chest pain.  No abdominal pain, no nausea or vomiting.  Assessment & Plan: Principal Problem:   COVID-19 virus infection Active Problems:   Non-small cell carcinoma of right lung, stage 1 (HCC)   CKD (chronic kidney disease), stage IV (HCC)   Chronic systolic heart failure (HCC)   Dementia (HCC)   Essential hypertension   Principal Problem Acute Hypoxic Respiratory Failure due to Covid-19 Viral Illness -Patient admitted to the hospital on 7/9 with acute hypoxic respiratory failure, she was diagnosed with coronavirus.  Chest x-ray on admission with cardiomegaly without evidence of CHF, with faint bibasilar opacities, images personally reviewed. -She was started on Remdesivir along with steroids on admission, however overnight respiratory status seems to have gotten worse currently requiring 4 L nasal cannula, she is slightly more tachypneic.  I have discussed risks and benefits regarding with Actemra with the patient's POA his son Chrissie Noa, and will go ahead and administer Actemra today given worsening  of her inflammatory markers along with her clinical picture  The treatment plan and use of medications and known side effects were discussed with patient/family, they were clearly explained that there is no proven definitive treatment for COVID-19 infection, any medications used here are based on published clinical articles/anecdotal data which are not peer-reviewed or randomized control trials.  Complete risks and long-term side effects are unknown, however in the best clinical judgment they seem to be of some clinical benefit rather than medical risks.  Patient agree with the treatment plan and want to receive the given medications.  COVID-19 Labs  Recent Labs    05/14/19 1310 05/15/19 0400  DDIMER 3.17* 3.00*  FERRITIN 386* 416*  LDH 393* 428*  CRP 10.9* 13.7*    Lab Results  Component Value Date   SARSCOV2NAA POSITIVE (A) 05/14/2019    Fever:  Afebrile Oxygen requirements:  4 L nasal cannula Antibiotics: No antibacterials Remdesivir:  7/9 >>, day 2 Steroids:  Solu-Medrol 60 every 12 Diuretics:  None Actemra:  7/10 Convalescent Plasma:  None Vitamin C and Zinc: yes  Active Problems Chronic systolic CHF -Most recent 2D echo was done on 04/22/2019 showed an EF of 30-35%, normal RV -Currently appears euvolemic, avoid IV fluids and maintain even balance -Strict ins and outs  Chronic kidney disease stage IV -Creatinine varied between 2 and 3.2 in 2019, 3.2 on admission last night and 2.9 this morning, overall stable and at baseline  Hypertension -Continue BiDil and metoprolol, blood pressure stable  Dementia -Patient able to live independently with son visiting every day and helping her cook, she is appropriate however does exhibit features of dementia with poor short-term  memory  History of non-small cell lung cancer/COPD -Status post right lower lobe lobectomy, completed 4 cycles of adjuvant chemotherapy.  Her lung cancer appears to remain in remission -She is on  chronic prednisone at home -Continue IV steroids   Scheduled Meds:  aspirin EC  81 mg Oral QHS   atorvastatin  40 mg Oral q1800   docusate sodium  100 mg Oral BID   donepezil  10 mg Oral QHS   enoxaparin (LOVENOX) injection  30 mg Subcutaneous Q24H   febuxostat  40 mg Oral Daily   isosorbide-hydrALAZINE  1 tablet Oral BID   methylPREDNISolone (SOLU-MEDROL) injection  60 mg Intravenous Q12H   metoprolol succinate  100 mg Oral Daily   sodium chloride flush  3 mL Intravenous Q12H   Continuous Infusions:  remdesivir 100 mg in NS 250 mL     tocilizumab (ACTEMRA) IV     PRN Meds:.acetaminophen, bisacodyl, ondansetron **OR** ondansetron (ZOFRAN) IV, oxyCODONE, polyethylene glycol   DVT prophylaxis: Lovenox Code Status: Full code, this was confirmed again with patient's son Chrissie Noa over the phone on 7/10 Family Communication: Discussed with son Chrissie Noa over the phone, [phone] Disposition Plan: To be determined, Home when ready  Consultants:   None  Procedures:   None   Antimicrobials:  None   Objective: Vitals:   05/14/19 2325 05/15/19 0000 05/15/19 0400 05/15/19 0800  BP:  111/61 124/67 139/81  Pulse: 61 61 69 69  Resp: 18 20 19 20   Temp: 97.6 F (36.4 C)  98.1 F (36.7 C) (!) 97.4 F (36.3 C)  TempSrc:      SpO2: 92% 92% 92% 93%  Weight:      Height:        Intake/Output Summary (Last 24 hours) at 05/15/2019 1005 Last data filed at 05/14/2019 2230 Gross per 24 hour  Intake 3 ml  Output --  Net 3 ml   Filed Weights   05/13/19 2253  Weight: 57.2 kg    Examination:  Constitutional: Eating breakfast, sitting at the edge of the bed, overall comfortable but tachypneic at times Eyes: PERRL, lids and conjunctivae normal, no scleral icterus ENMT: Mucous membranes are moist.  Neck: normal, supple Respiratory: Bibasilar rhonchi, no wheezing, no crackles.  Increased respiratory effort, tachypneic Cardiovascular: Regular rate and rhythm, no  murmurs / rubs / gallops. No LE edema.  Abdomen: no tenderness. Bowel sounds positive.  Musculoskeletal: no clubbing / cyanosis.  Skin: no rashes Neurologic: CN 2-12 grossly intact. Strength 5/5 in all 4.   Data Reviewed: I have independently reviewed following labs and imaging studies   CBC: Recent Labs  Lab 05/13/19 2354 05/15/19 0400  WBC 10.3 6.8  HGB 12.8 12.8  HCT 39.0 38.2  MCV 90.5 89.0  PLT 322 784   Basic Metabolic Panel: Recent Labs  Lab 05/13/19 2354 05/15/19 0400  NA 137 140  K 4.4 4.7  CL 103 103  CO2 17* 17*  GLUCOSE 157* 133*  BUN 60* 67*  CREATININE 3.27* 2.93*  CALCIUM 9.0 9.1  MG  --  2.3  PHOS  --  6.1*   GFR: Estimated Creatinine Clearance: 14.5 mL/min (A) (by C-G formula based on SCr of 2.93 mg/dL (H)). Liver Function Tests: Recent Labs  Lab 05/13/19 2354 05/15/19 0400  AST 32 35  ALT 22 20  ALKPHOS 76 65  BILITOT 0.7 0.7  PROT 7.4 7.9  ALBUMIN 3.4* 3.3*   No results for input(s): LIPASE, AMYLASE in the last 168 hours. No results  for input(s): AMMONIA in the last 168 hours. Coagulation Profile: No results for input(s): INR, PROTIME in the last 168 hours. Cardiac Enzymes: Recent Labs  Lab 05/13/19 2354  CKTOTAL 189   BNP (last 3 results) No results for input(s): PROBNP in the last 8760 hours. HbA1C: No results for input(s): HGBA1C in the last 72 hours. CBG: Recent Labs  Lab 05/13/19 2307  GLUCAP 160*   Lipid Profile: No results for input(s): CHOL, HDL, LDLCALC, TRIG, CHOLHDL, LDLDIRECT in the last 72 hours. Thyroid Function Tests: No results for input(s): TSH, T4TOTAL, FREET4, T3FREE, THYROIDAB in the last 72 hours. Anemia Panel: Recent Labs    05/14/19 1310 05/15/19 0400  FERRITIN 386* 416*   Urine analysis:    Component Value Date/Time   COLORURINE YELLOW 04/27/2015 [place] 04/27/2015 0834   LABSPEC 1.011 04/27/2015 0834   PHURINE 5.5 04/27/2015 [place] 04/27/2015 0834    HGBUR NEGATIVE 04/27/2015 0834   BILIRUBINUR NEGATIVE 04/27/2015 0834   KETONESUR NEGATIVE 04/27/2015 0834   PROTEINUR NEGATIVE 04/27/2015 0834   UROBILINOGEN 0.2 04/27/2015 0834   NITRITE NEGATIVE 04/27/2015 0834   LEUKOCYTESUR MODERATE (A) 04/27/2015 0834   Sepsis Labs: Invalid input(s): PROCALCITONIN, LACTICIDVEN  Recent Results (from the past 240 hour(s))  SARS Coronavirus 2 (CEPHEID - Performed in [place] hospital lab), Hosp Order     Status: Abnormal   Collection Time: 05/14/19  5:20 AM   Specimen: Nasopharyngeal Swab  Result Value Ref Range Status   SARS Coronavirus 2 POSITIVE (A) NEGATIVE Final    Comment: RESULT CALLED TO, READ BACK BY AND VERIFIED WITH: RN C CHRISCO @0630  05/14/19 BY S GEZAHEGN (NOTE) If result is NEGATIVE SARS-CoV-2 target nucleic acids are NOT DETECTED. The SARS-CoV-2 RNA is generally detectable in upper and lower  respiratory specimens during the acute phase of infection. The lowest  concentration of SARS-CoV-2 viral copies this assay can detect is 250  copies / mL. A negative result does not preclude SARS-CoV-2 infection  and should not be used as the sole basis for treatment or other  patient management decisions.  A negative result may occur with  improper specimen collection / handling, submission of specimen other  than nasopharyngeal swab, presence of viral mutation(s) within the  areas targeted by this assay, and inadequate number of viral copies  (<250 copies / mL). A negative result must be combined with clinical  observations, patient history, and epidemiological information. If result is POSITIVE SARS-CoV-2 target nucleic acids are DETECTED . The SARS-CoV-2 RNA is generally detectable in upper and lower  respiratory specimens during the acute phase of infection.  Positive  results are indicative of active infection with SARS-CoV-2.  Clinical  correlation with patient history and other diagnostic information is  necessary to determine  patient infection status.  Positive results do  not rule out bacterial infection or co-infection with other viruses. If result is PRESUMPTIVE POSTIVE SARS-CoV-2 nucleic acids MAY BE PRESENT.   A presumptive positive result was obtained on the submitted specimen  and confirmed on repeat testing.  While 2019 novel coronavirus  (SARS-CoV-2) nucleic acids may be present in the submitted sample  additional confirmatory testing may be necessary for epidemiological  and / or clinical management purposes  to differentiate between  SARS-CoV-2 and other Sarbecovirus currently known to infect humans.  If clinically indicated additional testing with an alternate test  methodology [phone] ) is advised. The SARS-CoV-2 RNA is generally  detectable in upper and  lower respiratory specimens during the acute  phase of infection. The expected result is Negative. Fact Sheet for Patients:  StrictlyIdeas.no Fact Sheet for Healthcare Providers: BankingDealers.co.za This test is not yet approved or cleared by the [place] FDA and has been authorized for detection and/or diagnosis of SARS-CoV-2 by FDA under an Emergency Use Authorization (EUA).  This EUA will remain in effect (meaning this test can be used) for the duration of the COVID-19 declaration under Section 564(b)(1) of the Act, 21 U.S.C. section 360bbb-3(b)(1), unless the authorization is terminated or revoked sooner. Performed at [facility], [place] [address] [place], [place] [address]       Radiology Studies: Dg Chest 2 View  Result Date: 05/14/2019 CLINICAL DATA:  Altered mental status. EXAM: CHEST - 2 VIEW COMPARISON:  One-view chest x-ray 05/22/2018 FINDINGS: Heart is enlarged. Atherosclerotic calcifications are present at the aortic arch. There is no edema or effusion. Pacing wires are stable. Lungs are hyperinflated. Mild bibasilar densities are present. IMPRESSION: 1. Stable  cardiomegaly without failure. 2. Acute cardiopulmonary disease. 3. Stable changes of emphysema. 4. Bibasilar opacities likely reflect atelectasis or scarring. Electronically Signed   By: San Morelle M.D.   On: 05/14/2019 04:38   Ct Head Wo Contrast  Result Date: 05/14/2019 CLINICAL DATA:  Altered mental status.  Known cerebral aneurysm. EXAM: CT HEAD WITHOUT CONTRAST TECHNIQUE: Contiguous axial images were obtained from the base of the skull through the vertex without intravenous contrast. COMPARISON:  Next MRI brain 05/25/2018 FINDINGS: Brain: No acute infarct, hemorrhage, or mass lesion is present. Periventricular and subcortical white matter changes bilaterally are stable. The ventricles are proportionate to the degree of atrophy. The brainstem and cerebellum are within normal limits. No significant extraaxial fluid collection is present. 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 Aneurysm measures 8 mm. There is no evidence for rupture. Vascular calcifications are again seen at the cavernous internal carotid arteries and at the dural margin of the left vertebral artery. There is no hyperdense vessel. Skull: Calvarium is intact. No focal lytic or blastic lesions are present. No significant extracranial soft tissue lesions are present. Sinuses/Orbits: The paranasal sinuses and mastoid air cells are clear. The globes and orbits are within normal limits. IMPRESSION: 1. Stable atrophy and white matter disease. 2. No acute intracranial abnormality. 3. 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 Electronically Signed   By: San Morelle M.D.   On: 05/14/2019 05:11    Marzetta Board, MD, PhD [facility]  Contact via  [email or web address]  [place] P: [phone] F: [phone]

## 2019-05-15 NOTE — Procedures (Signed)
Intubation Procedure Note Betty [name] [id number] Sep 07, 1946  Procedure: Intubation Indications: Airway protection and maintenance severe respiratory failure  Procedure Details Consent: Unable to obtain consent because of emergent medical necessity. Time Out: Verified patient identification, verified procedure, site/side was marked, verified correct patient position, special equipment/implants available, medications/allergies/relevent history reviewed, required imaging and test results available.  Performed  Drugs Etomdate 6m IV, Versed 270mIV, Fentanyl 5048mIV, Rocuronium 58m50m DL x 1 with MAC 3 blade Grade 2 view 7.5 ET tube passed through cords under direct visualization Placement confirmed with bilateral breath sounds, positive EtCO2 change and smoke in tube   Evaluation Hemodynamic Status: BP stable throughout; O2 sats: stable throughout Patient's Current Condition: stable Complications: No apparent complications Patient did tolerate procedure well. Chest X-ray ordered to verify placement.  CXR: pending.   DougSimonne Maffucci0/2020

## 2019-05-16 ENCOUNTER — Inpatient Hospital Stay (HOSPITAL_COMMUNITY): Payer: Medicare Other

## 2019-05-16 DIAGNOSIS — G9341 Metabolic encephalopathy: Secondary | ICD-10-CM

## 2019-05-16 DIAGNOSIS — J069 Acute upper respiratory infection, unspecified: Secondary | ICD-10-CM

## 2019-05-16 DIAGNOSIS — N179 Acute kidney failure, unspecified: Secondary | ICD-10-CM

## 2019-05-16 DIAGNOSIS — J988 Other specified respiratory disorders: Secondary | ICD-10-CM

## 2019-05-16 DIAGNOSIS — N189 Chronic kidney disease, unspecified: Secondary | ICD-10-CM

## 2019-05-16 LAB — COMPREHENSIVE METABOLIC PANEL
ALT: 20 U/L (ref 0–44)
AST: 32 U/L (ref 15–41)
Albumin: 3 g/dL — ABNORMAL LOW (ref 3.5–5.0)
Alkaline Phosphatase: 59 U/L (ref 38–126)
Anion gap: 13 (ref 5–15)
BUN: 99 mg/dL — ABNORMAL HIGH (ref 8–23)
CO2: 23 mmol/L (ref 22–32)
Calcium: 8.8 mg/dL — ABNORMAL LOW (ref 8.9–10.3)
Chloride: 107 mmol/L (ref 98–111)
Creatinine, Ser: 3.19 mg/dL — ABNORMAL HIGH (ref 0.44–1.00)
GFR calc Af Amer: 16 mL/min — ABNORMAL LOW (ref >60)
GFR calc non Af Amer: 14 mL/min — ABNORMAL LOW (ref >60)
Glucose, Bld: 190 mg/dL — ABNORMAL HIGH (ref 70–99)
Potassium: 5.2 mmol/L — ABNORMAL HIGH (ref 3.5–5.1)
Sodium: 143 mmol/L (ref 135–145)
Total Bilirubin: 0.3 mg/dL (ref 0.3–1.2)
Total Protein: 6.9 g/dL (ref 6.5–8.1)

## 2019-05-16 LAB — HEMOGLOBIN A1C
Hgb A1c MFr Bld: 6.9 % — ABNORMAL HIGH (ref 4.8–5.6)
Mean Plasma Glucose: 151.33 mg/dL

## 2019-05-16 LAB — URINE CULTURE: Culture: 10000 — AB

## 2019-05-16 LAB — CBC
HCT: 33.7 % — ABNORMAL LOW (ref 36.0–46.0)
Hemoglobin: 11.2 g/dL — ABNORMAL LOW (ref 12.0–15.0)
MCH: 29.7 pg (ref 26.0–34.0)
MCHC: 33.2 g/dL (ref 30.0–36.0)
MCV: 89.4 fL (ref 80.0–100.0)
Platelets: 370 10*3/uL (ref 150–400)
RBC: 3.77 MIL/uL — ABNORMAL LOW (ref 3.87–5.11)
RDW: 12.7 % (ref 11.5–15.5)
WBC: 10.9 10*3/uL — ABNORMAL HIGH (ref 4.0–10.5)
nRBC: 0 % (ref 0.0–0.2)

## 2019-05-16 LAB — GLUCOSE, CAPILLARY
Glucose-Capillary: 185 mg/dL — ABNORMAL HIGH (ref 70–99)
Glucose-Capillary: 195 mg/dL — ABNORMAL HIGH (ref 70–99)
Glucose-Capillary: 244 mg/dL — ABNORMAL HIGH (ref 70–99)
Glucose-Capillary: 263 mg/dL — ABNORMAL HIGH (ref 70–99)
Glucose-Capillary: 173 mg/dL — ABNORMAL HIGH (ref 70–99)

## 2019-05-16 LAB — MAGNESIUM: Magnesium: 2.4 mg/dL (ref 1.7–2.4)

## 2019-05-16 LAB — FERRITIN: Ferritin: 352 ng/mL — ABNORMAL HIGH (ref 11–307)

## 2019-05-16 LAB — D-DIMER, QUANTITATIVE: D-Dimer, Quant: 7.58 ug/mL-FEU — ABNORMAL HIGH (ref 0.00–0.50)

## 2019-05-16 LAB — PHOSPHORUS: Phosphorus: 5.6 mg/dL — ABNORMAL HIGH (ref 2.5–4.6)

## 2019-05-16 LAB — C-REACTIVE PROTEIN: CRP: 8.2 mg/dL — ABNORMAL HIGH (ref <1.0)

## 2019-05-16 LAB — LACTATE DEHYDROGENASE: LDH: 460 U/L — ABNORMAL HIGH (ref 98–192)

## 2019-05-16 MED ORDER — HEPARIN SODIUM (PORCINE) 10000 UNIT/ML IJ SOLN
7500.0000 [IU] | Freq: Three times a day (TID) | INTRAMUSCULAR | Status: DC
  Administered 2019-05-16 – 2019-05-19 (×9): 7500 [IU] via SUBCUTANEOUS
  Filled 2019-05-16 (×9): qty 1

## 2019-05-16 MED ORDER — PRO-STAT SUGAR FREE PO LIQD
30.0000 mL | Freq: Every day | ORAL | Status: DC
  Administered 2019-05-17 – 2019-05-18 (×2): 30 mL
  Filled 2019-05-16 (×2): qty 30

## 2019-05-16 MED ORDER — SODIUM ZIRCONIUM CYCLOSILICATE 10 G PO PACK
10.0000 g | PACK | Freq: Once | ORAL | Status: AC
  Administered 2019-05-16: 10 g via ORAL
  Filled 2019-05-16: qty 1

## 2019-05-16 MED ORDER — DEXMEDETOMIDINE HCL IN NACL 400 MCG/100ML IV SOLN
0.4000 ug/kg/h | INTRAVENOUS | Status: DC
  Administered 2019-05-16: 10:00:00 0.7 ug/kg/h via INTRAVENOUS
  Administered 2019-05-16: 1 ug/kg/h via INTRAVENOUS
  Administered 2019-05-16: 1.2 ug/kg/h via INTRAVENOUS
  Administered 2019-05-17 (×2): 1 ug/kg/h via INTRAVENOUS
  Administered 2019-05-17: 1.1 ug/kg/h via INTRAVENOUS
  Administered 2019-05-18: 0.9 ug/kg/h via INTRAVENOUS
  Filled 2019-05-16 (×7): qty 100

## 2019-05-16 MED ORDER — HEPARIN SODIUM (PORCINE) 5000 UNIT/ML IJ SOLN
5000.0000 [IU] | Freq: Three times a day (TID) | INTRAMUSCULAR | Status: DC
  Administered 2019-05-16: 5000 [IU] via SUBCUTANEOUS
  Filled 2019-05-16: qty 1

## 2019-05-16 MED ORDER — VITAL AF 1.2 CAL PO LIQD
1000.0000 mL | ORAL | Status: DC
  Administered 2019-05-16 – 2019-05-18 (×3): 1000 mL

## 2019-05-16 MED ORDER — INSULIN ASPART 100 UNIT/ML ~~LOC~~ SOLN
0.0000 [IU] | SUBCUTANEOUS | Status: DC
  Administered 2019-05-16: 11 [IU] via SUBCUTANEOUS
  Administered 2019-05-16 – 2019-05-17 (×3): 4 [IU] via SUBCUTANEOUS
  Administered 2019-05-17: 15 [IU] via SUBCUTANEOUS
  Administered 2019-05-17: 13:00:00 11 [IU] via SUBCUTANEOUS
  Administered 2019-05-17: 20 [IU] via SUBCUTANEOUS
  Administered 2019-05-17: 7 [IU] via SUBCUTANEOUS
  Administered 2019-05-18 (×2): 11 [IU] via SUBCUTANEOUS
  Administered 2019-05-18: 4 [IU] via SUBCUTANEOUS
  Administered 2019-05-18: 20:00:00 3 [IU] via SUBCUTANEOUS
  Administered 2019-05-19: 20:00:00 7 [IU] via SUBCUTANEOUS
  Administered 2019-05-19 (×3): 3 [IU] via SUBCUTANEOUS
  Administered 2019-05-20 (×2): 7 [IU] via SUBCUTANEOUS
  Administered 2019-05-20: 4 [IU] via SUBCUTANEOUS
  Administered 2019-05-20 (×2): 7 [IU] via SUBCUTANEOUS
  Administered 2019-05-20 – 2019-05-21 (×2): 4 [IU] via SUBCUTANEOUS
  Administered 2019-05-22: 11:00:00 3 [IU] via SUBCUTANEOUS
  Administered 2019-05-22 (×2): 4 [IU] via SUBCUTANEOUS
  Administered 2019-05-22: 3 [IU] via SUBCUTANEOUS
  Administered 2019-05-22: 23:00:00 4 [IU] via SUBCUTANEOUS
  Administered 2019-05-22 – 2019-05-23 (×4): 3 [IU] via SUBCUTANEOUS
  Administered 2019-05-24: 4 [IU] via SUBCUTANEOUS
  Administered 2019-05-24 – 2019-05-25 (×5): 3 [IU] via SUBCUTANEOUS
  Administered 2019-05-26 (×2): 4 [IU] via SUBCUTANEOUS
  Administered 2019-05-26 – 2019-05-27 (×3): 3 [IU] via SUBCUTANEOUS
  Administered 2019-05-27 (×2): 4 [IU] via SUBCUTANEOUS
  Administered 2019-05-27 – 2019-05-28 (×2): 3 [IU] via SUBCUTANEOUS
  Administered 2019-05-28: 12:00:00 4 [IU] via SUBCUTANEOUS
  Administered 2019-05-28 (×2): 7 [IU] via SUBCUTANEOUS
  Administered 2019-05-28: 4 [IU] via SUBCUTANEOUS
  Administered 2019-05-29 (×4): 3 [IU] via SUBCUTANEOUS
  Administered 2019-05-29 (×2): 4 [IU] via SUBCUTANEOUS
  Administered 2019-05-30 (×2): 3 [IU] via SUBCUTANEOUS
  Administered 2019-05-30: 17:00:00 4 [IU] via SUBCUTANEOUS
  Administered 2019-05-31 – 2019-06-01 (×4): 3 [IU] via SUBCUTANEOUS
  Administered 2019-06-01: 12:00:00 4 [IU] via SUBCUTANEOUS
  Administered 2019-06-01 (×2): 3 [IU] via SUBCUTANEOUS
  Administered 2019-06-02 (×2): 4 [IU] via SUBCUTANEOUS
  Administered 2019-06-03: 06:00:00 3 [IU] via SUBCUTANEOUS
  Administered 2019-06-03 (×2): 4 [IU] via SUBCUTANEOUS
  Administered 2019-06-04 (×2): 3 [IU] via SUBCUTANEOUS

## 2019-05-16 MED ORDER — INSULIN ASPART 100 UNIT/ML ~~LOC~~ SOLN
2.0000 [IU] | SUBCUTANEOUS | Status: DC
  Administered 2019-05-16 – 2019-05-17 (×5): 2 [IU] via SUBCUTANEOUS

## 2019-05-16 MED ORDER — INSULIN DETEMIR 100 UNIT/ML ~~LOC~~ SOLN
0.0750 [IU]/kg | Freq: Two times a day (BID) | SUBCUTANEOUS | Status: DC
  Administered 2019-05-16 – 2019-05-17 (×2): 4 [IU] via SUBCUTANEOUS
  Filled 2019-05-16 (×2): qty 0.04

## 2019-05-16 MED ORDER — INSULIN ASPART 100 UNIT/ML ~~LOC~~ SOLN
1.0000 [IU] | SUBCUTANEOUS | Status: DC
  Administered 2019-05-16: 2 [IU] via SUBCUTANEOUS
  Administered 2019-05-16 (×2): 3 [IU] via SUBCUTANEOUS
  Administered 2019-05-16: 2 [IU] via SUBCUTANEOUS

## 2019-05-16 MED ORDER — IPRATROPIUM-ALBUTEROL 0.5-2.5 (3) MG/3ML IN SOLN
3.0000 mL | Freq: Four times a day (QID) | RESPIRATORY_TRACT | Status: DC
  Administered 2019-05-16 – 2019-05-18 (×8): 3 mL via RESPIRATORY_TRACT
  Filled 2019-05-16 (×8): qty 3

## 2019-05-16 MED ORDER — DEXTROSE 10 % IV SOLN
INTRAVENOUS | Status: DC
  Administered 2019-05-19 – 2019-05-20 (×2): via INTRAVENOUS
  Administered 2019-05-26: 40 mL/h via INTRAVENOUS
  Administered 2019-05-27: 14:00:00 via INTRAVENOUS

## 2019-05-16 MED ORDER — CHLORHEXIDINE GLUCONATE CLOTH 2 % EX PADS
6.0000 | MEDICATED_PAD | Freq: Every day | CUTANEOUS | Status: DC
  Administered 2019-05-17 – 2019-06-12 (×28): 6 via TOPICAL

## 2019-05-16 MED ORDER — VITAL AF 1.2 CAL PO LIQD: 1000.0000 mL | ORAL | Status: DC

## 2019-05-16 NOTE — Progress Notes (Signed)
Event note  On routine vitals check by myself around 5:15 PM I have noted that patient was on a nonrebreather.  In the morning on my bedside rounds evaluation she was on 4 L nasal cannula.  I have reached out to the nurse and evaluated patient at bedside, RN place patient on a nonrebreather around 3 PM as documented on the vital signs sheet, however this MD was not notified.  On my evaluation, patient is satting 100% on a nonrebreather, tachypneic but relatively calm.  Lungs sound diminished but overall clear without significant rhonchi or wheezing.  Blood pressure and heart rate were stable.  Obtained stat chest x-ray and stat ABG.  Chest x-ray personally reviewed, relatively unchanged from prior exam.  ABG shows profound hypoxia, which prompted patient to be transferred to the ICU. Given CHF history will give Lasix x 1. It appears that she needed intubation shortly after arrival to the ICU. CC time 40 minutes, 5:20 - 6 pm.  Betty [name] M. Cruzita Lederer, MD, PhD [facility]  Contact via  [email or web address]  [place] P: [phone] F: [phone]

## 2019-05-16 NOTE — Progress Notes (Signed)
2100:  Paged [facility] BG 213: orders given for single nighttime dose of insulin.  2 units given   2326: Pt BG 231 on TF at 20, not at goal. Sensitive scale ordered q4.per [facility]ad.

## 2019-05-16 NOTE — Progress Notes (Signed)
PROGRESS NOTE  Betty [name] IWL:[id number] DOB: May 11, 1946 DOA: 05/13/2019  PCP: Josetta Huddle, MD  Brief History/Interval Summary: 73 year old female with history of coronary artery disease, non-small cell lung cancer in 2016, hypertension, hyperlipidemia, dementia, COPD, chronic kidney disease stage IV, chronic combined CHF who was brought to the hospital by the patient's son due to increased confusion and weakness.  She was diagnosed with coronavirus in the ED on 7/9.  She was also found to be hypoxic requiring 2 L nasal cannula.  She was admitted to the hospital and placed on steroids along with Remdesivir.  Reason for Visit: Acute respiratory disease due to COVID-19  Consultants: Pulmonology  Procedures:  Intubation 7/10 Central line placement right IJ 7/10  Antibiotics: Anti-infectives (From admission, onward)   Start     Dose/Rate Route Frequency Ordered Stop   05/15/19 1230  remdesivir 100 mg in sodium chloride 0.9 % 250 mL IVPB     100 mg 500 mL/hr over 30 Minutes Intravenous Every 24 hours 05/14/19 1148 05/19/19 1229   05/14/19 1230  remdesivir 200 mg in sodium chloride 0.9 % 250 mL IVPB     200 mg 500 mL/hr over 30 Minutes Intravenous Once 05/14/19 1148 05/14/19 1551       Subjective/Interval History: Patient is intubated and sedated.  Noted to be somewhat agitated.    Assessment/Plan:  Acute Hypoxic Resp. Failure due to Acute Covid 19 Viral Illness  Vent Mode: PRVC FiO2 (%):  [40 %-50 %] 40 % Set Rate:  [24 bmp] 24 bmp Vt Set:  [430 mL] 430 mL PEEP:  [10 cmH20] 10 cmH20 Plateau Pressure:  [18 cmH20-23 cmH20] 23 cmH20     Component Value Date/Time   PHART 7.440 05/15/2019 2049   PCO2ART 35.0 05/15/2019 2049   PO2ART 447.0 (H) 05/15/2019 2049   HCO3 23.7 05/15/2019 2049   TCO2 25 05/15/2019 2049   ACIDBASEDEF 3.0 (H) 05/15/2019 1839   O2SAT 100.0 05/15/2019 2049    COVID-19 Labs  Recent Labs    05/14/19 1310 05/15/19 0400 05/16/19 0500  DDIMER  3.17* 3.00* 7.58*  FERRITIN 386* 416* 352*  LDH 393* 428* 460*  CRP 10.9* 13.7* 8.2*    Lab Results  Component Value Date   SARSCOV2NAA POSITIVE (A) 05/14/2019     Fever: No fever in the last 24 hours Oxygen requirements: Mechanical ventilation.  40% FiO2.  Saturating in the 90s. Antibiotics: No antibacterials Remdesivir: Day 3 today Steroids: Solu-Medrol 60 mg every 12 hours Diuretics: Given Lasix x1 yesterday Actemra: Actemra x1 on 7/10 Convalescent Plasma: Not given yet Vitamin C and Zinc: Continue DVT Prophylaxis: Heparin 5000 units every 8 hours  Patient remains mechanically ventilated.  Pulmonology is following.  CRP improved to 8.2.  Ferritin 352.  D-dimer 7.58.  Patient is on Remdesivir and steroids which will be continued.  Chest x-ray underwhelming.  Hold off on further doses of Actemra considering improvement in inflammatory markers.  Patient was given Lasix yesterday due to history of congestive heart failure.  Hold off on further doses for now.  To be started on Precedex for her agitation.  Chronic systolic CHF Most recent echocardiogram done in June 2020 showed a EF of 30 to 35%.  Does not appear to be in florid CHF currently.  Strict ins and outs and daily weights.  Acute on chronic kidney disease stage IV Baseline creatinine around 2-3.0.  Slightly worsening creatinine and BUN noted today.  Possibly due to diuretics given yesterday.  We will  hold further doses of diuretics.  Stop nephrotoxic agents.  Monitor urine output.  Hyperglycemia due to steroids CBG noted to be elevated.  Most likely due to steroids.  HbA1c 6.9.  Continue SSI.  Essential hypertension Monitor blood pressures closely.  She is noted to be somewhat hypotensive this morning.  Hold her antihypertensives.  History of dementia Appears to be mild dementia.  Patient is able to live independently.  Apparently has poor short-term memory.  Continue to monitor.  History of non-small cell lung cancer  and COPD She is status post right lower lobe lobectomy and has completed 4 cycles of adjuvant chemotherapy.  Lung cancer appears to be in remission.  She is on chronic prednisone at home.  Currently on IV steroids.  Nutrition Continue tube feedings.   DVT Prophylaxis: Subcutaneous heparin PUD Prophylaxis: Protonix Code Status: Full code Family Communication: Will discuss with family today Disposition Plan: Will remain in ICU   Medications:  Scheduled:  aspirin EC  81 mg Oral QHS   atorvastatin  40 mg Oral q1800   chlorhexidine gluconate (MEDLINE KIT)  15 mL Mouth Rinse BID   docusate  100 mg Oral BID   donepezil  10 mg Oral QHS   febuxostat  40 mg Oral Daily   feeding supplement (PRO-STAT SUGAR FREE 64)  30 mL Per Tube BID   feeding supplement (VITAL HIGH PROTEIN)  1,000 mL Per Tube Q24H   heparin injection (subcutaneous)  5,000 Units Subcutaneous Q8H   insulin aspart  0-5 Units Subcutaneous QHS   insulin aspart  0-9 Units Subcutaneous TID WC   insulin aspart  1-3 Units Subcutaneous Q4H   isosorbide-hydrALAZINE  1 tablet Oral BID   mouth rinse  15 mL Mouth Rinse 10 times per day   methylPREDNISolone (SOLU-MEDROL) injection  60 mg Intravenous Q12H   metoprolol succinate  100 mg Oral Daily   pantoprazole sodium  40 mg Per Tube Q1200   sodium chloride flush  3 mL Intravenous Q12H   vitamin C  500 mg Oral Daily   zinc sulfate  220 mg Oral Daily   Continuous:  dexmedetomidine (PRECEDEX) IV infusion 0.4 mcg/kg/hr (05/16/19 1200)   fentaNYL infusion INTRAVENOUS Stopped (05/16/19 1110)   remdesivir 100 mg in NS 250 mL 100 mg (05/15/19 1133)   UTM:[id number], bisacodyl, fentaNYL, midazolam, midazolam, ondansetron **OR** ondansetron (ZOFRAN) IV, oxyCODONE, polyethylene glycol   Objective:  Vital Signs  Vitals:   05/16/19 0700 05/16/19 0741 05/16/19 0800 05/16/19 1000  BP: 111/70 124/73 121/66 (!) 84/54  Pulse: (!) 59 60 61 (!) 59  Resp: (!) 24 (!)  32 (!) 31 (!) 24  Temp:   97.6 F (36.4 C)   TempSrc:   Axillary   SpO2: 98% 99% 96% 97%  Weight:      Height:        Intake/Output Summary (Last 24 hours) at 05/16/2019 1218 Last data filed at 05/16/2019 1045 Gross per 24 hour  Intake 599.37 ml  Output 620 ml  Net -20.63 ml   Filed Weights   05/13/19 2253 05/16/19 0500  Weight: 57.2 kg 57.4 kg    General appearance: Patient is intubated and sedated.  However still noted to be agitated. Resp: Coarse breath sounds bilaterally.  Few crackles at the bases.  No wheezing or rhonchi. Cardio: S1-S2 is normal regular.  No S3-S4.  No rubs murmurs or bruit.  Telemetry shows paced rhythm GI: Abdomen is soft.  Nontender nondistended.  Bowel sounds are present normal.  No masses organomegaly Extremities: No edema.   Neurologic: Noted to be agitated.   Lab Results:  Data Reviewed: I have personally reviewed following labs and imaging studies  CBC: Recent Labs  Lab 05/13/19 2354 05/15/19 0400 05/15/19 1839 05/15/19 2049 05/16/19 0500  WBC 10.3 6.8  --   --  10.9*  HGB 12.8 12.8 11.2* 11.6* 11.2*  HCT 39.0 38.2 33.0* 34.0* 33.7*  MCV 90.5 89.0  --   --  89.4  PLT 322 382  --   --  088    Basic Metabolic Panel: Recent Labs  Lab 05/13/19 2354 05/15/19 0400 05/15/19 1839 05/15/19 2049 05/15/19 2103 05/16/19 0500  NA 137 140 139 140  --  143  K 4.4 4.7 4.3 4.4  --  5.2*  CL 103 103  --   --   --  107  CO2 17* 17*  --   --   --  23  GLUCOSE 157* 133*  --   --   --  190*  BUN 60* 67*  --   --   --  99*  CREATININE 3.27* 2.93*  --   --   --  3.19*  CALCIUM 9.0 9.1  --   --   --  8.8*  MG  --  2.3  --   --  2.3 2.4  PHOS  --  6.1*  --   --  5.4* 5.6*    GFR: Estimated Creatinine Clearance: 13.4 mL/min (A) (by C-G formula based on SCr of 3.19 mg/dL (H)).  Liver Function Tests: Recent Labs  Lab 05/13/19 2354 05/15/19 0400 05/16/19 0500  AST 32 35 32  ALT _0 ALKPHOS 76 65 59  BILITOT 0.7 0.7 0.3  PROT 7.4  7.9 6.9  ALBUMIN 3.4* 3.3* 3.0*    Cardiac Enzymes: Recent Labs  Lab 05/13/19 2354  CKTOTAL 189    HbA1C: Recent Labs    05/16/19 0500  HGBA1C 6.9*    CBG: Recent Labs  Lab 05/15/19 2049 05/15/19 2326 05/16/19 0456 05/16/19 0731 05/16/19 1149  GLUCAP 213* 231* 185* 195* 244*    Anemia Panel: Recent Labs    05/15/19 0400 05/16/19 0500  FERRITIN 416* 352*    Recent Results (from the past 240 hour(s))  SARS Coronavirus 2 (CEPHEID - Performed in [place] hospital lab), Hosp Order     Status: Abnormal   Collection Time: 05/14/19  5:20 AM   Specimen: Nasopharyngeal Swab  Result Value Ref Range Status   SARS Coronavirus 2 POSITIVE (A) NEGATIVE Final    Comment: RESULT CALLED TO, READ BACK BY AND VERIFIED WITH: RN C CHRISCO _1  05/14/19 BY S GEZAHEGN (NOTE) If result is NEGATIVE SARS-CoV-2 target nucleic acids are NOT DETECTED. The SARS-CoV-2 RNA is generally detectable in upper and lower  respiratory specimens during the acute phase of infection. The lowest  concentration of SARS-CoV-2 viral copies this assay can detect is 250  copies / mL. A negative result does not preclude SARS-CoV-2 infection  and should not be used as the sole basis for treatment or other  patient management decisions.  A negative result may occur with  improper specimen collection / handling, submission of specimen other  than nasopharyngeal swab, presence of viral mutation(s) within the  areas targeted by this assay, and inadequate number of viral copies  (<250 copies / mL). A negative result must be combined with clinical  observations, patient history, and epidemiological information. If result is POSITIVE SARS-CoV-2  target nucleic acids are DETECTED . The SARS-CoV-2 RNA is generally detectable in upper and lower  respiratory specimens during the acute phase of infection.  Positive  results are indicative of active infection with SARS-CoV-2.  Clinical  correlation with patient  history and other diagnostic information is  necessary to determine patient infection status.  Positive results do  not rule out bacterial infection or co-infection with other viruses. If result is PRESUMPTIVE POSTIVE SARS-CoV-2 nucleic acids MAY BE PRESENT.   A presumptive positive result was obtained on the submitted specimen  and confirmed on repeat testing.  While 2019 novel coronavirus  (SARS-CoV-2) nucleic acids may be present in the submitted sample  additional confirmatory testing may be necessary for epidemiological  and / or clinical management purposes  to differentiate between  SARS-CoV-2 and other Sarbecovirus currently known to infect humans.  If clinically indicated additional testing with an alternate test  methodology [id number] ) is advised. The SARS-CoV-2 RNA is generally  detectable in upper and lower respiratory specimens during the acute  phase of infection. The expected result is Negative. Fact Sheet for Patients:  StrictlyIdeas.no Fact Sheet for Healthcare Providers: BankingDealers.co.za This test is not yet approved or cleared by the [place] FDA and has been authorized for detection and/or diagnosis of SARS-CoV-2 by FDA under an Emergency Use Authorization (EUA).  This EUA will remain in effect (meaning this test can be used) for the duration of the COVID-19 declaration under Section 564(b)(1) of the Act, 21 U.S.C. section 360bbb-3(b)(1), unless the authorization is terminated or revoked sooner. Performed at [facility], [place] [address], [place], [place] [address]   Urine culture     Status: Abnormal   Collection Time: 05/14/19 11:30 AM   Specimen: Urine, Clean Catch  Result Value Ref Range Status   Specimen Description   Final    URINE, CLEAN CATCH Performed at [facility], [place] [address] [place], [place] [address]    Special Requests   Final    NONE Performed at [facility], [place] [address] [place], [place] [address]    Culture (A)  Final    10,000 COLONIES/mL MULTIPLE SPECIES PRESENT, SUGGEST RECOLLECTION   Report Status 05/16/2019 FINAL  Final      Radiology Studies: Dg Abd 1 View  Result Date: 05/15/2019 CLINICAL DATA:  Evaluate OG tube EXAM: ABDOMEN - 1 VIEW COMPARISON:  None. FINDINGS: The OG tube terminates in the central lower abdomen, likely in either the distal gastric body or proximal antrum. IMPRESSION: The OG tube appears to terminate within the stomach. Electronically Signed   By: Dorise Bullion III M.D   On: 05/15/2019 20:02   Dg Chest Port 1 View  Result Date: 05/15/2019 CLINICAL DATA:  Status post intubation EXAM: PORTABLE CHEST 1 VIEW COMPARISON:  Film from earlier in the same day. FINDINGS: Cardiac shadow is stable. Pacing device is again seen. Endotracheal tube is noted in satisfactory position. Right jugular central line is seen in satisfactory position. No pneumothorax is noted. Persistent scarring in the bases is noted. IMPRESSION: Tubes and lines as described above. Electronically Signed   By: Inez Catalina M.D.   On: 05/15/2019 19:49   Dg Chest Port 1 View  Result Date: 05/15/2019 CLINICAL DATA:  Pneumonia EXAM: PORTABLE CHEST 1 VIEW COMPARISON:  May 14, 2019 FINDINGS: The multi lead left-sided pacemaker is unchanged in positioning. The heart size remains stable but enlarged. There is scarring versus atelectasis at the lung  bases bilaterally. Emphysematous changes are again noted. There is some mild hyperexpansion of the lung fields bilaterally. There is no acute osseous abnormality. IMPRESSION: Stable appearance of the chest. No significant oval change from study dated 05/14/2019 Electronically Signed   By: Constance Holster M.D.   On: 05/15/2019 18:15       LOS: 2 days   [facility] Pager on [email or web address]  05/16/2019, 12:18 PM

## 2019-05-16 NOTE — Progress Notes (Signed)
NAME:  Betty [name], MRN:  [id number], DOB:  1946/08/17, LOS: 2 ADMISSION DATE:  05/13/2019, CONSULTATION DATE: July 10 REFERRING MD: Cruzita Lederer, CHIEF COMPLAINT: Dyspnea  Brief History   73 year old female with multiple medical problems admitted on July 9 with worsening dyspnea in setting of severe acute respiratory failure with hypoxemia due to COVID-19 pneumonia.  Past Medical History  COPD NSCLC 5465 CKD Systolic heart failure Hypertension  Significant Hospital Events   7/9 admission 7/10 transfer to ICU  Consults:  PCCM  Procedures:  7/10 ETT > 7/10 R IJ CVL >   Significant Diagnostic Tests:  04/2019 LVEF 30-35%, LVH, LV hypokinesis, RV normal function, minimal valve insufficiencies  Micro Data:  7/9 SARS COV2 >  7/9 Urine culture >   Antimicrobials:  7/9 remdesivir >  7/9 solumedrol >    Interim history/subjective:  Intubated overnight Quiet night, received some sedation, minimal oxygen needs Nursing reports some anxiety  Objective   Blood pressure 111/70, pulse (!) 59, temperature (!) 97.5 F (36.4 C), temperature source Axillary, resp. rate (!) 24, height 5\' 4"  (1.626 m), weight 57.4 kg, SpO2 98 %. CVP:  [7 mmHg] 7 mmHg  Vent Mode: PRVC FiO2 (%):  [50 %] 50 % Set Rate:  [24 bmp] 24 bmp Vt Set:  [430 mL] 430 mL PEEP:  [10 cmH20] 10 cmH20 Plateau Pressure:  [18 cmH20-23 cmH20] 18 cmH20   Intake/Output Summary (Last 24 hours) at 05/16/2019 0759 Last data filed at 05/16/2019 0600 Gross per 24 hour  Intake 190.93 ml  Output 410 ml  Net -219.07 ml   Filed Weights   05/13/19 2253 05/16/19 0500  Weight: 57.2 kg 57.4 kg    Examination:  General:  In bed on vent HENT: NCAT ETT in place PULM: CTA B, vent supported breathing CV: RRR, no mgr GI: BS+, soft, nontender MSK: normal bulk and tone Neuro: sedated on vent  7/10 chest x-ray images independently reviewed showing central line and endotracheal tube in place, essentially clear lungs, there is an  interstitial infiltrate in the bases of both lungs particularly when compared to images from June 2019, emphysema  Resolved Hospital Problem list     Assessment & Plan:  Acute respiratory failure with hypoxemia: No doubt COVID is at play but she does not appear to have severe pneumonia as other patients have had.  Wean PEEP and FiO2 now for SaO2 > 88% Ventilator associated pneumonia prevention protocol Consider spontaneous breathing trial/pressure support soon Add Precedex for ventilator synchrony as she is quite anxious Continue remdesivir for now Continue solumedrol No need for actemra as she doesn't have   Anxiety:  Add Precedex Wean down fentanyl RA SS target 0 to -1  Mild hypotension Hold bidil Wean fentanyl off  COPD Add duoneb  Congestive heart failure: Home bidil is on hold because of mild hypotension  Acute on chronic renal failure: doubt she is a very good candidate for dialysis given multiple comorbid illnesses Monitor BMET and UOP Replace electrolytes as needed   Best practice:  Diet: tube feeding Pain/Anxiety/Delirium protocol (if indicated): RASS target 0 to -1 VAP protocol (if indicated): yes DVT prophylaxis: sub q heparin GI prophylaxis: Pantoprazole for stress ulcer prophylaxis Glucose control: SSI per TRH Mobility: bed rest, range of motion exercises OK Code Status: full, agree this needs to be  Family Communication: per [place] Disposition:   Labs   CBC: Recent Labs  Lab 05/13/19 2354 05/15/19 0400 05/15/19 1839 05/15/19 2049 05/16/19 0500  WBC 10.3  6.8  --   --  10.9*  HGB 12.8 12.8 11.2* 11.6* 11.2*  HCT 39.0 38.2 33.0* 34.0* 33.7*  MCV 90.5 89.0  --   --  89.4  PLT 322 382  --   --  277    Basic Metabolic Panel: Recent Labs  Lab 05/13/19 2354 05/15/19 0400 05/15/19 1839 05/15/19 2049 05/15/19 2103 05/16/19 0500  NA 137 140 139 140  --  143  K 4.4 4.7 4.3 4.4  --  5.2*  CL 103 103  --   --   --  107  CO2 17* 17*  --   --   --   23  GLUCOSE 157* 133*  --   --   --  190*  BUN 60* 67*  --   --   --  99*  CREATININE 3.27* 2.93*  --   --   --  3.19*  CALCIUM 9.0 9.1  --   --   --  8.8*  MG  --  2.3  --   --  2.3 2.4  PHOS  --  6.1*  --   --  5.4* 5.6*   GFR: Estimated Creatinine Clearance: 13.4 mL/min (A) (by C-G formula based on SCr of 3.19 mg/dL (H)). Recent Labs  Lab 05/13/19 2354 05/14/19 1310 05/15/19 0400 05/16/19 0500  PROCALCITON  --  0.59  --   --   WBC 10.3  --  6.8 10.9*    Liver Function Tests: Recent Labs  Lab 05/13/19 2354 05/15/19 0400 05/16/19 0500  AST 32 35 32  ALT 22 20 20   ALKPHOS 76 65 59  BILITOT 0.7 0.7 0.3  PROT 7.4 7.9 6.9  ALBUMIN 3.4* 3.3* 3.0*   No results for input(s): LIPASE, AMYLASE in the last 168 hours. No results for input(s): AMMONIA in the last 168 hours.  ABG    Component Value Date/Time   PHART 7.440 05/15/2019 2049   PCO2ART 35.0 05/15/2019 2049   PO2ART 447.0 (H) 05/15/2019 2049   HCO3 23.7 05/15/2019 2049   TCO2 25 05/15/2019 2049   ACIDBASEDEF 3.0 (H) 05/15/2019 1839   O2SAT 100.0 05/15/2019 2049     Coagulation Profile: No results for input(s): INR, PROTIME in the last 168 hours.  Cardiac Enzymes: Recent Labs  Lab 05/13/19 2354  CKTOTAL 189    HbA1C: No results found for: HGBA1C  CBG: Recent Labs  Lab 05/13/19 2307 05/15/19 2049 05/15/19 2326 05/16/19 0456 05/16/19 0731  GLUCAP 160* 213* 231* 185* 195*     Critical care time: 40 minutes     Roselie Awkward, MD [facility] Pager: [phone] Cell: [phone] If no response, call [phone]

## 2019-05-16 NOTE — Progress Notes (Signed)
Was assessing a different pt around 2300 & turned around to see pt attempting to self-extubate. Restraints still intact. Pt had slumped herself down in the bed in order to reach her tube. Pt aggressivle pinching and swatting at staff while pulling at tube. RT & Elink called immediately. 1mg  of versed given. ETT now at 19cm, was at 23cm at start of shift. Pt still pulling volumes on vent & sats stable in the 90's. Elink MD said to utilize Prn's to keep pt sedated instead of restarting anymore sedation gtt. RT at bedside to advance airway. MD will order STAT CXR to confirm placement.

## 2019-05-16 NOTE — Progress Notes (Signed)
 Initial Nutrition Assessment   RD working remotely.  DOCUMENTATION CODES:   Not applicable  INTERVENTION:   Tube Feeding:  Vital AF 1.2 at 45 ml/hr Pro-Stat 30 mL daily Provides 96 g of protein, 1396 kcals, 875 mL of free water Meets 100% estimated protein, 103% calorie needs  Monitor electrolytes, renal function; adjust TF formula/rate as appropriate   NUTRITION DIAGNOSIS:   Inadequate oral intake related to acute illness as evidenced by NPO status.   GOAL:   Patient will meet greater than or equal to 90% of their needs   MONITOR:   Vent status, TF tolerance, Labs, Weight trends, Skin  REASON FOR ASSESSMENT:   Consult, Ventilator Enteral/tube feeding initiation and management  ASSESSMENT:   73 yo female admitted with severe acute respiratory failure from COVID-19 pneumonia requiring intubation, AKI/CKD IV.  PMH includes COPD, CKD IV, CHF, dementia, HTN, HLD, non-small cell lung ca in 2016, CAD.  7/09 Admit 7/10 Transfer to ICU and Intubated  Patient is currently intubated on ventilator support, fentanyl and precedex drips for sedation, not requiring pressor support MV: 10.6 L/min Temp (24hrs), Avg:97.6 F (36.4 C), Min:97 F (36.1 C), Max:99 F (37.2 C)  Unable to obtain diet and weight history at this time  Current wt 57.4 kg; weight down slightly since last year. Noted weight around 58-59 kg.    Mild hyperkalemia, lokelma ordered; mild hyperphosphatemia  Labs: potassium 5.2 (H), CBGs 185-244, phosphorus 5.6 (H), Creatinine 3.19, BUN 99 Meds: solumedrol, ss novolog, lokelma   NUTRITION - FOCUSED PHYSICAL EXAM:  Unable to assess, working remotely  Diet Order:   Diet Order            Diet NPO time specified  Diet effective now              EDUCATION NEEDS:   Not appropriate for education at this time  Skin:  Skin Assessment: Reviewed RN Assessment(no pressure injuries)  Last BM:  no documented BM  Height:   Ht Readings from Last 1  Encounters:  05/15/19 5\' 4"  (1.626 m)    Weight:   Wt Readings from Last 1 Encounters:  05/16/19 57.4 kg    Ideal Body Weight:  54.5 kg  BMI:  Body mass index is 21.72 kg/m.  Estimated Nutritional Needs:   Kcal:  1351 kcals  Protein:  85-100 g  Fluid:  >/= 1.5 L    Diyana Starrett MS, RDN, LDN, CNSC [phone] Pager  [phone] Weekend/On-Call Pager

## 2019-05-16 NOTE — Progress Notes (Signed)
Called to room for patient displacing ETT.  Tube out to 19 cm, advanced to previous setting of 23cm at lip.  BS equal and bilateral, O2 sats and  tidal volumes on vent appropriate.

## 2019-05-16 NOTE — Progress Notes (Signed)
150 cc Fentanyl wasted in sink from infusion bag with 2nd RN, [employer], as witness.

## 2019-05-16 NOTE — Progress Notes (Addendum)
[facility] Progress Note Patient Name: Betty [name] DOB: 12-09-1945 MRN: [id number]   Date of Service  05/16/2019  HPI/Events of Note  Notified to urgently see the patient for concern for self-extubation.  On video assessment, O2 sats are 99%, tidal volume on the vent regular and consistent, and no notable cuff leak.    eICU Interventions  Advance ETT and repeat CXR to confirm placement.  Continue predecex.  Give prn pushes of versed and fentanyl as needed.  Notified CRNA of possible reintubation pending CXR.     Intervention Category Major Interventions: Other:  [name] 05/16/2019, 11:14 PM

## 2019-05-16 NOTE — Progress Notes (Signed)
Both patient's son, Chrissie Noa, and daughter-in-law called and given update on Ms. Newcom's condition and plan of care. Their questions were answered, and the family expressed their appreciation of our care.

## 2019-05-16 NOTE — Progress Notes (Signed)
Son Nyra Anspaugh called for update on pt. Informed him that pt was transferred to the ICU and of patient current condition. All questions answered at this time.

## 2019-05-17 LAB — CBC
HCT: 34.3 % — ABNORMAL LOW (ref 36.0–46.0)
Hemoglobin: 11.2 g/dL — ABNORMAL LOW (ref 12.0–15.0)
MCH: 29.3 pg (ref 26.0–34.0)
MCHC: 32.7 g/dL (ref 30.0–36.0)
MCV: 89.8 fL (ref 80.0–100.0)
Platelets: 315 10*3/uL (ref 150–400)
RBC: 3.82 MIL/uL — ABNORMAL LOW (ref 3.87–5.11)
RDW: 12.8 % (ref 11.5–15.5)
WBC: 11.2 10*3/uL — ABNORMAL HIGH (ref 4.0–10.5)
nRBC: 0 % (ref 0.0–0.2)

## 2019-05-17 LAB — COMPREHENSIVE METABOLIC PANEL
ALT: 18 U/L (ref 0–44)
AST: 23 U/L (ref 15–41)
Albumin: 3.1 g/dL — ABNORMAL LOW (ref 3.5–5.0)
Alkaline Phosphatase: 58 U/L (ref 38–126)
Anion gap: 13 (ref 5–15)
BUN: 70 mg/dL — ABNORMAL HIGH (ref 8–23)
CO2: 23 mmol/L (ref 22–32)
Calcium: 8.8 mg/dL — ABNORMAL LOW (ref 8.9–10.3)
Chloride: 110 mmol/L (ref 98–111)
Creatinine, Ser: 2.81 mg/dL — ABNORMAL HIGH (ref 0.44–1.00)
GFR calc Af Amer: 18 mL/min — ABNORMAL LOW (ref >60)
GFR calc non Af Amer: 16 mL/min — ABNORMAL LOW (ref >60)
Glucose, Bld: 322 mg/dL — ABNORMAL HIGH (ref 70–99)
Potassium: 4.2 mmol/L (ref 3.5–5.1)
Sodium: 146 mmol/L — ABNORMAL HIGH (ref 135–145)
Total Bilirubin: 0.1 mg/dL — ABNORMAL LOW (ref 0.3–1.2)
Total Protein: 6.5 g/dL (ref 6.5–8.1)

## 2019-05-17 LAB — GLUCOSE, CAPILLARY
Glucose-Capillary: 106 mg/dL — ABNORMAL HIGH (ref 70–99)
Glucose-Capillary: 188 mg/dL — ABNORMAL HIGH (ref 70–99)
Glucose-Capillary: 230 mg/dL — ABNORMAL HIGH (ref 70–99)
Glucose-Capillary: 250 mg/dL — ABNORMAL HIGH (ref 70–99)
Glucose-Capillary: 274 mg/dL — ABNORMAL HIGH (ref 70–99)
Glucose-Capillary: 302 mg/dL — ABNORMAL HIGH (ref 70–99)
Glucose-Capillary: 367 mg/dL — ABNORMAL HIGH (ref 70–99)

## 2019-05-17 LAB — PHOSPHORUS: Phosphorus: 4.8 mg/dL — ABNORMAL HIGH (ref 2.5–4.6)

## 2019-05-17 MED ORDER — QUETIAPINE FUMARATE 25 MG PO TABS
25.0000 mg | ORAL_TABLET | Freq: Two times a day (BID) | ORAL | Status: DC
  Administered 2019-05-17: 25 mg via ORAL
  Filled 2019-05-17: qty 1

## 2019-05-17 MED ORDER — HYDRALAZINE HCL 20 MG/ML IJ SOLN: 10.0000 mg | Freq: Four times a day (QID) | INTRAMUSCULAR | Status: DC | PRN

## 2019-05-17 MED ORDER — INSULIN DETEMIR 100 UNIT/ML ~~LOC~~ SOLN
15.0000 [IU] | Freq: Two times a day (BID) | SUBCUTANEOUS | Status: DC
  Administered 2019-05-17 – 2019-05-25 (×16): 15 [IU] via SUBCUTANEOUS
  Filled 2019-05-17 (×18): qty 0.15

## 2019-05-17 MED ORDER — METHYLPREDNISOLONE SODIUM SUCC 40 MG IJ SOLR
40.0000 mg | INTRAMUSCULAR | Status: DC
  Administered 2019-05-17: 12:00:00 40 mg via INTRAVENOUS
  Filled 2019-05-17: qty 1

## 2019-05-17 MED ORDER — CLONAZEPAM 0.5 MG PO TBDP
1.0000 mg | ORAL_TABLET | Freq: Two times a day (BID) | ORAL | Status: DC
  Administered 2019-05-17 – 2019-05-22 (×8): 1 mg via ORAL
  Filled 2019-05-17 (×9): qty 2

## 2019-05-17 MED ORDER — CHLORHEXIDINE GLUCONATE 0.12 % MT SOLN
15.0000 mL | Freq: Two times a day (BID) | OROMUCOSAL | Status: DC
  Administered 2019-05-17 – 2019-05-26 (×18): 15 mL via OROMUCOSAL
  Filled 2019-05-17 (×7): qty 15

## 2019-05-17 MED ORDER — QUETIAPINE FUMARATE 50 MG PO TABS
50.0000 mg | ORAL_TABLET | Freq: Two times a day (BID) | ORAL | Status: DC
  Administered 2019-05-17 – 2019-05-25 (×15): 50 mg via ORAL
  Filled 2019-05-17 (×16): qty 1

## 2019-05-17 MED ORDER — CLONAZEPAM 0.1 MG/ML ORAL SUSPENSION: 0.5000 mg | Freq: Two times a day (BID) | ORAL | Status: DC

## 2019-05-17 MED ORDER — INSULIN ASPART 100 UNIT/ML ~~LOC~~ SOLN
6.0000 [IU] | SUBCUTANEOUS | Status: DC
  Administered 2019-05-17 – 2019-05-26 (×32): 6 [IU] via SUBCUTANEOUS

## 2019-05-17 MED ORDER — CLONAZEPAM 0.5 MG PO TBDP
0.5000 mg | ORAL_TABLET | Freq: Two times a day (BID) | ORAL | Status: DC
  Administered 2019-05-17: 0.5 mg via ORAL
  Filled 2019-05-17: qty 1

## 2019-05-17 NOTE — Progress Notes (Signed)
NAME:  Betty [name], MRN:  [id number], DOB:  06/12/1946, LOS: 3 ADMISSION DATE:  05/13/2019, CONSULTATION DATE: July 10 REFERRING MD: Cruzita Lederer, CHIEF COMPLAINT: Dyspnea  Brief History   73 year old female with multiple medical problems admitted on July 9 with worsening dyspnea in setting of severe acute respiratory failure with hypoxemia due to COVID-19 pneumonia.  Past Medical History  COPD NSCLC 0347 CKD Systolic heart failure Hypertension  Significant Hospital Events   7/9 admission 7/10 transfer to ICU July 11 attempted to self extubate  Consults:  PCCM  Procedures:  7/10 ETT > 7/10 R IJ CVL >   Significant Diagnostic Tests:  04/2019 LVEF 30-35%, LVH, LV hypokinesis, RV normal function, minimal valve insufficiencies  Micro Data:  7/9 SARS COV2 >  7/9 Urine culture >   Antimicrobials:  7/9 remdesivir >  7/9 solumedrol >    Interim history/subjective:   Remains anxious Several attempts at self extubation yesterday Will wean on pressure support while on Precedex infusion  Objective   Blood pressure (!) 146/74, pulse 95, temperature 98.4 F (36.9 C), temperature source Axillary, resp. rate (!) 23, height 5\' 4"  (1.626 m), weight 59.7 kg, SpO2 96 %. CVP:  [4 mmHg-23 mmHg] 5 mmHg  Vent Mode: PRVC FiO2 (%):  [30 %-40 %] 30 % Set Rate:  [24 bmp] 24 bmp Vt Set:  [430 mL] 430 mL PEEP:  [5 cmH20] 5 cmH20 Plateau Pressure:  [13 cmH20-14 cmH20] 13 cmH20   Intake/Output Summary (Last 24 hours) at 05/17/2019 0802 Last data filed at 05/17/2019 0640 Gross per 24 hour  Intake 2066.42 ml  Output 1915 ml  Net 151.42 ml   Filed Weights   05/13/19 2253 05/16/19 0500 05/17/19 0433  Weight: 57.2 kg 57.4 kg 59.7 kg    Examination:  General:  In bed on vent HENT: NCAT ETT in place PULM: CTA B, vent supported breathing CV: RRR, no mgr GI: BS+, soft, nontender MSK: normal bulk and tone Neuro: sedated on vent   7/10 chest x-ray images independently reviewed showing  central line and endotracheal tube in place, essentially clear lungs, there is an interstitial infiltrate in the bases of both lungs particularly when compared to images from June 2019, emphysema  Resolved Hospital Problem list     Assessment & Plan:  Acute respiratory failure with hypoxemia: No doubt COVID is at play but she does not appear to have severe pneumonia as other patients have had.  Suspect COPD contributing Ventilator associated pneumonia prevention protocol Continue spontaneous breathing trial as long as tolerated on pressure support Treat anxiety aggressively Continue Combivent scheduled Continue Solu-Medrol Continue remdesivir No role for Actemra  Anxiety:  Continue precedex Add clonazepam Add quetiapine  Mild hypotension, improving, sedation related Hold bidil, consider restart if BP elevated Wean off fentanyl  COPD Continue duoneb  Congestive heart failure: Consider adding back home bidil in next few days  CKD, unclear stage Monitor BMET and UOP Replace electrolytes as needed    Best practice:  Diet: tube feeding Pain/Anxiety/Delirium protocol (if indicated): RASS target 0 to -1 VAP protocol (if indicated): yes DVT prophylaxis: sub q heparin GI prophylaxis: Pantoprazole for stress ulcer prophylaxis Glucose control: SSI per TRH Mobility: bed rest, range of motion exercises OK Code Status: full, agree this needs to be  Family Communication: per TRH Disposition:   Labs   CBC: Recent Labs  Lab 05/13/19 2354 05/15/19 0400 05/15/19 1839 05/15/19 2049 05/16/19 0500 05/17/19 0413  WBC 10.3 6.8  --   --  10.9* 11.2*  HGB 12.8 12.8 11.2* 11.6* 11.2* 11.2*  HCT 39.0 38.2 33.0* 34.0* 33.7* 34.3*  MCV 90.5 89.0  --   --  89.4 89.8  PLT 322 382  --   --  370 710    Basic Metabolic Panel: Recent Labs  Lab 05/13/19 2354 05/15/19 0400 05/15/19 1839 05/15/19 2049 05/15/19 2103 05/16/19 0500 05/17/19 0413  NA 137 140 139 140  --  143 146*  K  4.4 4.7 4.3 4.4  --  5.2* 4.2  CL 103 103  --   --   --  107 110  CO2 17* 17*  --   --   --  23 23  GLUCOSE 157* 133*  --   --   --  190* 322*  BUN 60* 67*  --   --   --  99* PENDING  CREATININE 3.27* 2.93*  --   --   --  3.19* 2.81*  CALCIUM 9.0 9.1  --   --   --  8.8* 8.8*  MG  --  2.3  --   --  2.3 2.4  --   PHOS  --  6.1*  --   --  5.4* 5.6* 4.8*   GFR: Estimated Creatinine Clearance: 15.2 mL/min (A) (by C-G formula based on SCr of 2.81 mg/dL (H)). Recent Labs  Lab 05/13/19 2354 05/14/19 1310 05/15/19 0400 05/16/19 0500 05/17/19 0413  PROCALCITON  --  0.59  --   --   --   WBC 10.3  --  6.8 10.9* 11.2*    Liver Function Tests: Recent Labs  Lab 05/13/19 2354 05/15/19 0400 05/16/19 0500 05/17/19 0413  AST 32 35 32 23  ALT 22 20 20 18   ALKPHOS 76 65 59 58  BILITOT 0.7 0.7 0.3 0.1*  PROT 7.4 7.9 6.9 6.5  ALBUMIN 3.4* 3.3* 3.0* 3.1*   No results for input(s): LIPASE, AMYLASE in the last 168 hours. No results for input(s): AMMONIA in the last 168 hours.  ABG    Component Value Date/Time   PHART 7.440 05/15/2019 2049   PCO2ART 35.0 05/15/2019 2049   PO2ART 447.0 (H) 05/15/2019 2049   HCO3 23.7 05/15/2019 2049   TCO2 25 05/15/2019 2049   ACIDBASEDEF 3.0 (H) 05/15/2019 1839   O2SAT 100.0 05/15/2019 2049     Coagulation Profile: No results for input(s): INR, PROTIME in the last 168 hours.  Cardiac Enzymes: Recent Labs  Lab 05/13/19 2354  CKTOTAL 189    HbA1C: Hgb A1c MFr Bld  Date/Time Value Ref Range Status  05/16/2019 05:00 AM 6.9 (H) 4.8 - 5.6 % Final    Comment:    (NOTE) Pre diabetes:          5.7%-6.4% Diabetes:              >6.4% Glycemic control for   <7.0% adults with diabetes     CBG: Recent Labs  Lab 05/16/19 1601 05/16/19 2007 05/17/19 0040 05/17/19 0349 05/17/19 0746  GLUCAP 263* 173* 274* 302* 367*     Critical care time: 33 minutes     Roselie Awkward, MD [place] [facility] Pager: [phone] Cell: [phone] If no  response, call [phone]

## 2019-05-17 NOTE — Progress Notes (Signed)
[facility] PCCM Evening rounds  Still has anxiety panic but when calm she is able to breathe comfortably on minimal ventilator settings Increase quetiapine and clonazepam ? Extubation in AM?  Roselie Awkward, MD [place] [facility] Pager: [phone] Cell: [phone] If no response, call [phone]

## 2019-05-17 NOTE — Progress Notes (Addendum)
PROGRESS NOTE  MASSA PE LZJ:[id number] DOB: Sep 16, 1946 DOA: 05/13/2019  PCP: Josetta Huddle, MD  Brief History/Interval Summary: 73 year old female with history of coronary artery disease, non-small cell lung cancer in 2016, hypertension, hyperlipidemia, dementia, COPD, chronic kidney disease stage IV, chronic combined CHF who was brought to the hospital by the patient's son due to increased confusion and weakness.  She was diagnosed with coronavirus in the ED on 7/9.  She was also found to be hypoxic requiring 2 L nasal cannula.  She was admitted to the hospital and placed on steroids along with Remdesivir.  Reason for Visit: Acute respiratory disease due to COVID-19  Consultants: Pulmonology  Procedures:  Intubation 7/10 Central line placement right IJ 7/10  Antibiotics: Anti-infectives (From admission, onward)   Start     Dose/Rate Route Frequency Ordered Stop   05/15/19 1230  remdesivir 100 mg in sodium chloride 0.9 % 250 mL IVPB     100 mg 500 mL/hr over 30 Minutes Intravenous Every 24 hours 05/14/19 1148 05/19/19 1229   05/14/19 1230  remdesivir 200 mg in sodium chloride 0.9 % 250 mL IVPB     200 mg 500 mL/hr over 30 Minutes Intravenous Once 05/14/19 1148 05/14/19 1551       Subjective/Interval History: Patient remains intubated sedated.  Noted to be agitated.  Apparently overnight she almost self extubated.      Assessment/Plan:  Acute Hypoxic Resp. Failure due to Acute Covid 19 Viral Illness  Vent Mode: CPAP;PSV FiO2 (%):  [30 %-40 %] 30 % Set Rate:  [24 bmp] 24 bmp Vt Set:  [430 mL] 430 mL PEEP:  [5 cmH20] 5 cmH20 Pressure Support:  [5 cmH20] 5 cmH20 Plateau Pressure:  [13 cmH20-14 cmH20] 13 cmH20     Component Value Date/Time   PHART 7.440 05/15/2019 2049   PCO2ART 35.0 05/15/2019 2049   PO2ART 447.0 (H) 05/15/2019 2049   HCO3 23.7 05/15/2019 2049   TCO2 25 05/15/2019 2049   ACIDBASEDEF 3.0 (H) 05/15/2019 1839   O2SAT 100.0 05/15/2019 2049     COVID-19 Labs  Recent Labs    05/15/19 0400 05/16/19 0500  DDIMER 3.00* 7.58*  FERRITIN 416* 352*  LDH 428* 460*  CRP 13.7* 8.2*    Lab Results  Component Value Date   SARSCOV2NAA POSITIVE (A) 05/14/2019     Fever: Patient has been afebrile Oxygen requirements: Mechanical ventilation.  30% FiO2.  Saturating in the 90s.   Antibiotics: No antibacterials Remdesivir: Day 4 today Steroids: Solu-Medrol will be tapered down as it could be contributing to her encephalopathy Diuretics: Given Lasix x1 on 7/10. Actemra: Actemra x1 on 7/10 Convalescent Plasma: Not given yet Vitamin C and Zinc: Continue DVT Prophylaxis: Heparin 7500 units every 8 hours  Patient remains mechanically ventilated.  Some episodes of agitation noted overnight.  She almost self extubated overnight.  Seems to be encephalopathic.  Her respiratory status however seems to be stable.  Pulmonology to consider extubation later today patient's agitation does not get worse.  Inflammatory markers had improved.  Unfortunately CRP was not checked today.  We will check it tomorrow.  Continue steroids and Remdesivir.  She was given 1 dose of Actemra.  No clear indication to repeat.  Due to possibility of encephalopathy we will cut back on the dose of steroids.  On Precedex for sedation along with fentanyl.  Chronic systolic CHF Most recent echocardiogram done in June 2020 showed a EF of 30 to 35%.  Seems to be stable from  a volume standpoint.  Strict ins and outs and daily weights.  Acute on chronic kidney disease stage IV Baseline creatinine around 2.0-3.0.  Patient's BUN and creatinine was slightly worse yesterday.  Possibly due to diuretics given the day before.  Diuretics were held.  Has had good urine output.  Creatinine is better today.  Continue to monitor.   Hyperglycemia due to steroids CBGs are high due to steroids.  Hopefully as steroid is tapered down this will improve.  We will increase the dose of her long-acting  insulin.  Continue SSI.  HbA1c is 6.9.    Essential hypertension Blood pressures have improved.  Mildly hypertensive at times.  Continue to hold her antihypertensives.  Hydralazine as needed.    History of dementia/acute metabolic encephalopathy Appears to be mild dementia.  Patient is able to live independently.  Apparently has poor short-term memory.  Continue to monitor.  Patient has encephalopathy most likely due to acute illness and medications.  Continue to monitor.  No neurological deficits.  History of non-small cell lung cancer and COPD She is status post right lower lobe lobectomy and has completed 4 cycles of adjuvant chemotherapy.  Lung cancer appears to be in remission.    History of gout Initially it was thought that patient is on chronic prednisone at home but review of previous notes did not mention prednisone.  Discussed on steroids with patient's son.  Apparently over the last 1 to 2 months she has had issues with acute gout for which she was prescribed courses of steroids.  She does not take steroid on a regular basis.  However she was on a course of prednisone at the time of admission.  Nutrition Continue tube feedings.   DVT Prophylaxis: Subcutaneous heparin PUD Prophylaxis: Protonix Code Status: Full code Family Communication: Discussed with son yesterday.  Will do so again today. Disposition Plan: Will remain in ICU   Medications:  Scheduled: . aspirin EC  81 mg Oral QHS  . chlorhexidine  15 mL Mouth/Throat BID  . Chlorhexidine Gluconate Cloth  6 each Topical Daily  . clonazepam  0.5 mg Oral BID  . docusate  100 mg Oral BID  . donepezil  10 mg Oral QHS  . febuxostat  40 mg Oral Daily  . feeding supplement (PRO-STAT SUGAR FREE 64)  30 mL Per Tube Daily  . heparin injection (subcutaneous)  7,500 Units Subcutaneous Q8H  . insulin aspart  0-20 Units Subcutaneous Q4H  . insulin aspart  6 Units Subcutaneous Q4H  . insulin detemir  15 Units Subcutaneous BID  .  ipratropium-albuterol  3 mL Nebulization Q6H  . mouth rinse  15 mL Mouth Rinse 10 times per day  . methylPREDNISolone (SOLU-MEDROL) injection  40 mg Intravenous Q24H  . pantoprazole sodium  40 mg Per Tube Q1200  . QUEtiapine  25 mg Oral BID  . sodium chloride flush  3 mL Intravenous Q12H   Continuous: . dexmedetomidine (PRECEDEX) IV infusion 0.3 mcg/kg/hr (05/17/19 1300)  . dextrose    . feeding supplement (VITAL AF 1.2 CAL) 1,000 mL (05/16/19 1400)  . fentaNYL infusion INTRAVENOUS Stopped (05/16/19 1110)  . remdesivir 100 mg in NS 250 mL 100 mg (05/17/19 1230)   HGD:[id number], bisacodyl, fentaNYL, midazolam, ondansetron **OR** ondansetron (ZOFRAN) IV, oxyCODONE, polyethylene glycol   Objective:  Vital Signs  Vitals:   05/17/19 0800 05/17/19 0900 05/17/19 1000 05/17/19 1148  BP: 122/71 134/80 137/78 (!) 145/74  Pulse: (!) 117 (!) 116 (!) 106 (!) 102  Resp: Marland Kitchen)  37 (!) 22 (!) 23 (!) 23  Temp: 98.4 F (36.9 C)     TempSrc: Axillary     SpO2: 97% 97% 97% 97%  Weight:      Height:        Intake/Output Summary (Last 24 hours) at 05/17/2019 1415 Last data filed at 05/17/2019 1300 Gross per 24 hour  Intake 1531.52 ml  Output 2175 ml  Net -643.48 ml   Filed Weights   05/13/19 2253 05/16/19 0500 05/17/19 0433  Weight: 57.2 kg 57.4 kg 59.7 kg    General appearance: Intubated and sedated.  Occasionally noted to be agitated  Resp: Good air entry bilaterally.  Coarse breath sounds.  No wheezing or rhonchi.   Cardio: S1-S2 is normal regular.  No S3-S4.  No rubs murmurs or bruit GI: Abdomen is soft.  Nontender nondistended.  Bowel sounds are present normal.  No masses organomegaly Extremities: No edema.  Full range of motion of lower extremities. Neurologic: Noted to be moving all her extremities   Lab Results:  Data Reviewed: I have personally reviewed following labs and imaging studies  CBC: Recent Labs  Lab 05/13/19 2354 05/15/19 0400 05/15/19 1839 05/15/19 2049  05/16/19 0500 05/17/19 0413  WBC 10.3 6.8  --   --  10.9* 11.2*  HGB 12.8 12.8 11.2* 11.6* 11.2* 11.2*  HCT 39.0 38.2 33.0* 34.0* 33.7* 34.3*  MCV 90.5 89.0  --   --  89.4 89.8  PLT 322 382  --   --  370 423    Basic Metabolic Panel: Recent Labs  Lab 05/13/19 2354 05/15/19 0400 05/15/19 1839 05/15/19 2049 05/15/19 2103 05/16/19 0500 05/17/19 0413  NA 137 140 139 140  --  143 146*  K 4.4 4.7 4.3 4.4  --  5.2* 4.2  CL 103 103  --   --   --  107 110  CO2 17* 17*  --   --   --  23 23  GLUCOSE 157* 133*  --   --   --  190* 322*  BUN 60* 67*  --   --   --  99* 70*  CREATININE 3.27* 2.93*  --   --   --  3.19* 2.81*  CALCIUM 9.0 9.1  --   --   --  8.8* 8.8*  MG  --  2.3  --   --  2.3 2.4  --   PHOS  --  6.1*  --   --  5.4* 5.6* 4.8*    GFR: Estimated Creatinine Clearance: 15.2 mL/min (A) (by C-G formula based on SCr of 2.81 mg/dL (H)).  Liver Function Tests: Recent Labs  Lab 05/13/19 2354 05/15/19 0400 05/16/19 0500 05/17/19 0413  AST 32 35 32 23  ALT 22 20 20 18   ALKPHOS 76 65 59 58  BILITOT 0.7 0.7 0.3 0.1*  PROT 7.4 7.9 6.9 6.5  ALBUMIN 3.4* 3.3* 3.0* 3.1*    Cardiac Enzymes: Recent Labs  Lab 05/13/19 2354  CKTOTAL 189    HbA1C: Recent Labs    05/16/19 0500  HGBA1C 6.9*    CBG: Recent Labs  Lab 05/16/19 2007 05/17/19 0040 05/17/19 0349 05/17/19 0746 05/17/19 1146  GLUCAP 173* 274* 302* 367* 250*    Anemia Panel: Recent Labs    05/15/19 0400 05/16/19 0500  FERRITIN 416* 352*    Recent Results (from the past 240 hour(s))  SARS Coronavirus 2 (CEPHEID - Performed in [place] hospital lab), [facility]  Status: Abnormal   Collection Time: 05/14/19  5:20 AM   Specimen: Nasopharyngeal Swab  Result Value Ref Range Status   SARS Coronavirus 2 POSITIVE (A) NEGATIVE Final    Comment: RESULT CALLED TO, READ BACK BY AND VERIFIED WITH: RN C CHRISCO @0630  05/14/19 BY S GEZAHEGN (NOTE) If result is NEGATIVE SARS-CoV-2 target nucleic acids are  NOT DETECTED. The SARS-CoV-2 RNA is generally detectable in upper and lower  respiratory specimens during the acute phase of infection. The lowest  concentration of SARS-CoV-2 viral copies this assay can detect is 250  copies / mL. A negative result does not preclude SARS-CoV-2 infection  and should not be used as the sole basis for treatment or other  patient management decisions.  A negative result may occur with  improper specimen collection / handling, submission of specimen other  than nasopharyngeal swab, presence of viral mutation(s) within the  areas targeted by this assay, and inadequate number of viral copies  (<250 copies / mL). A negative result must be combined with clinical  observations, patient history, and epidemiological information. If result is POSITIVE SARS-CoV-2 target nucleic acids are DETECTED . The SARS-CoV-2 RNA is generally detectable in upper and lower  respiratory specimens during the acute phase of infection.  Positive  results are indicative of active infection with SARS-CoV-2.  Clinical  correlation with patient history and other diagnostic information is  necessary to determine patient infection status.  Positive results do  not rule out bacterial infection or co-infection with other viruses. If result is PRESUMPTIVE POSTIVE SARS-CoV-2 nucleic acids MAY BE PRESENT.   A presumptive positive result was obtained on the submitted specimen  and confirmed on repeat testing.  While 2019 novel coronavirus  (SARS-CoV-2) nucleic acids may be present in the submitted sample  additional confirmatory testing may be necessary for epidemiological  and / or clinical management purposes  to differentiate between  SARS-CoV-2 and other Sarbecovirus currently known to infect humans.  If clinically indicated additional testing with an alternate test  methodology [id number] ) is advised. The SARS-CoV-2 RNA is generally  detectable in upper and lower respiratory specimens  during the acute  phase of infection. The expected result is Negative. Fact Sheet for Patients:  StrictlyIdeas.no Fact Sheet for Healthcare Providers: BankingDealers.co.za This test is not yet approved or cleared by the [place] FDA and has been authorized for detection and/or diagnosis of SARS-CoV-2 by FDA under an Emergency Use Authorization (EUA).  This EUA will remain in effect (meaning this test can be used) for the duration of the COVID-19 declaration under Section 564(b)(1) of the Act, 21 U.S.C. section 360bbb-3(b)(1), unless the authorization is terminated or revoked sooner. Performed at [facility], [place] [address], [place], [place] [address]   Urine culture     Status: Abnormal   Collection Time: 05/14/19 11:30 AM   Specimen: Urine, Clean Catch  Result Value Ref Range Status   Specimen Description   Final    URINE, CLEAN CATCH Performed at [facility], [place] [address], [place], [place] [address]    Special Requests   Final    NONE Performed at [facility], [place] [address] [place], [place] [address]    Culture (A)  Final    10,000 COLONIES/mL MULTIPLE SPECIES PRESENT, SUGGEST RECOLLECTION   Report Status 05/16/2019 FINAL  Final      Radiology Studies: Dg Abd 1 View  Result Date: 05/15/2019 CLINICAL DATA:  Evaluate OG tube EXAM: ABDOMEN -  1 VIEW COMPARISON:  None. FINDINGS: The OG tube terminates in the central lower abdomen, likely in either the distal gastric body or proximal antrum. IMPRESSION: The OG tube appears to terminate within the stomach. Electronically Signed   By: Dorise Bullion III M.D   On: 05/15/2019 20:02   Dg Chest Port 1 View  Result Date: 05/16/2019 CLINICAL DATA:  Check endotracheal tube position EXAM: PORTABLE CHEST 1 VIEW COMPARISON:  05/15/2019 FINDINGS: Cardiac shadow is stable. Pacing device is again seen. Right jugular central line is stable.  Endotracheal tube is noted approximately 7 cm above the carina slightly higher than that seen on the prior exam. Stable scarring in the bases is seen. No bony abnormality is noted. Gastric catheter is noted within the stomach. IMPRESSION: Tubes and lines as described. Electronically Signed   By: Inez Catalina M.D.   On: 05/16/2019 23:53   Dg Chest Port 1 View  Result Date: 05/15/2019 CLINICAL DATA:  Status post intubation EXAM: PORTABLE CHEST 1 VIEW COMPARISON:  Film from earlier in the same day. FINDINGS: Cardiac shadow is stable. Pacing device is again seen. Endotracheal tube is noted in satisfactory position. Right jugular central line is seen in satisfactory position. No pneumothorax is noted. Persistent scarring in the bases is noted. IMPRESSION: Tubes and lines as described above. Electronically Signed   By: Inez Catalina M.D.   On: 05/15/2019 19:49   Dg Chest Port 1 View  Result Date: 05/15/2019 CLINICAL DATA:  Pneumonia EXAM: PORTABLE CHEST 1 VIEW COMPARISON:  May 14, 2019 FINDINGS: The multi lead left-sided pacemaker is unchanged in positioning. The heart size remains stable but enlarged. There is scarring versus atelectasis at the lung bases bilaterally. Emphysematous changes are again noted. There is some mild hyperexpansion of the lung fields bilaterally. There is no acute osseous abnormality. IMPRESSION: Stable appearance of the chest. No significant oval change from study dated 05/14/2019 Electronically Signed   By: Constance Holster M.D.   On: 05/15/2019 18:15       LOS: 3 days   [facility] Pager on [email or web address]  05/17/2019, 2:15 PM

## 2019-05-17 NOTE — Progress Notes (Signed)
Ms. Sprong's son, Chrissie Noa, was called and given an update. He had no further questions and expressed his thanks for the call.

## 2019-05-18 ENCOUNTER — Inpatient Hospital Stay (HOSPITAL_COMMUNITY): Payer: Medicare Other

## 2019-05-18 DIAGNOSIS — E870 Hyperosmolality and hypernatremia: Secondary | ICD-10-CM

## 2019-05-18 DIAGNOSIS — G301 Alzheimer's disease with late onset: Secondary | ICD-10-CM

## 2019-05-18 DIAGNOSIS — F0280 Dementia in other diseases classified elsewhere without behavioral disturbance: Secondary | ICD-10-CM

## 2019-05-18 LAB — COMPREHENSIVE METABOLIC PANEL
ALT: 18 U/L (ref 0–44)
AST: 26 U/L (ref 15–41)
Albumin: 2.9 g/dL — ABNORMAL LOW (ref 3.5–5.0)
Alkaline Phosphatase: 52 U/L (ref 38–126)
Anion gap: 13 (ref 5–15)
BUN: 103 mg/dL — ABNORMAL HIGH (ref 8–23)
CO2: 22 mmol/L (ref 22–32)
Calcium: 8.9 mg/dL (ref 8.9–10.3)
Chloride: 118 mmol/L — ABNORMAL HIGH (ref 98–111)
Creatinine, Ser: 2.68 mg/dL — ABNORMAL HIGH (ref 0.44–1.00)
GFR calc Af Amer: 20 mL/min — ABNORMAL LOW (ref >60)
GFR calc non Af Amer: 17 mL/min — ABNORMAL LOW (ref >60)
Glucose, Bld: 301 mg/dL — ABNORMAL HIGH (ref 70–99)
Potassium: 4.2 mmol/L (ref 3.5–5.1)
Sodium: 153 mmol/L — ABNORMAL HIGH (ref 135–145)
Total Bilirubin: 0.4 mg/dL (ref 0.3–1.2)
Total Protein: 6.3 g/dL — ABNORMAL LOW (ref 6.5–8.1)

## 2019-05-18 LAB — BASIC METABOLIC PANEL
Anion gap: 15 (ref 5–15)
BUN: 96 mg/dL — ABNORMAL HIGH (ref 8–23)
CO2: 21 mmol/L — ABNORMAL LOW (ref 22–32)
Calcium: 9 mg/dL (ref 8.9–10.3)
Chloride: 122 mmol/L — ABNORMAL HIGH (ref 98–111)
Creatinine, Ser: 2.75 mg/dL — ABNORMAL HIGH (ref 0.44–1.00)
GFR calc Af Amer: 19 mL/min — ABNORMAL LOW (ref >60)
GFR calc non Af Amer: 16 mL/min — ABNORMAL LOW (ref >60)
Glucose, Bld: 84 mg/dL (ref 70–99)
Potassium: 3.6 mmol/L (ref 3.5–5.1)
Sodium: 158 mmol/L — ABNORMAL HIGH (ref 135–145)

## 2019-05-18 LAB — FERRITIN: Ferritin: 347 ng/mL — ABNORMAL HIGH (ref 11–307)

## 2019-05-18 LAB — POCT I-STAT 7, (LYTES, BLD GAS, ICA,H+H)
Acid-base deficit: 5 mmol/L — ABNORMAL HIGH (ref 0.0–2.0)
Bicarbonate: 21.3 mmol/L (ref 20.0–28.0)
Calcium, Ion: 1.35 mmol/L (ref 1.15–1.40)
HCT: 38 % (ref 36.0–46.0)
Hemoglobin: 12.9 g/dL (ref 12.0–15.0)
O2 Saturation: 89 %
Patient temperature: 99.3
Potassium: 3.7 mmol/L (ref 3.5–5.1)
Sodium: 157 mmol/L — ABNORMAL HIGH (ref 135–145)
TCO2: 23 mmol/L (ref 22–32)
pCO2 arterial: 42.5 mmHg (ref 32.0–48.0)
pH, Arterial: 7.309 — ABNORMAL LOW (ref 7.350–7.450)
pO2, Arterial: 62 mmHg — ABNORMAL LOW (ref 83.0–108.0)

## 2019-05-18 LAB — C-REACTIVE PROTEIN: CRP: 3.5 mg/dL — ABNORMAL HIGH (ref <1.0)

## 2019-05-18 LAB — PHOSPHORUS
Phosphorus: 3.9 mg/dL (ref 2.5–4.6)
Phosphorus: 2.1 mg/dL — ABNORMAL LOW (ref 2.5–4.6)
Phosphorus: 3.2 mg/dL (ref 2.5–4.6)

## 2019-05-18 LAB — GLUCOSE, CAPILLARY
Glucose-Capillary: 295 mg/dL — ABNORMAL HIGH (ref 70–99)
Glucose-Capillary: 280 mg/dL — ABNORMAL HIGH (ref 70–99)
Glucose-Capillary: 176 mg/dL — ABNORMAL HIGH (ref 70–99)
Glucose-Capillary: 66 mg/dL — ABNORMAL LOW (ref 70–99)
Glucose-Capillary: 138 mg/dL — ABNORMAL HIGH (ref 70–99)

## 2019-05-18 LAB — CBC
HCT: 34.9 % — ABNORMAL LOW (ref 36.0–46.0)
Hemoglobin: 11.1 g/dL — ABNORMAL LOW (ref 12.0–15.0)
MCH: 28.8 pg (ref 26.0–34.0)
MCHC: 31.8 g/dL (ref 30.0–36.0)
MCV: 90.4 fL (ref 80.0–100.0)
Platelets: 315 10*3/uL (ref 150–400)
RBC: 3.86 MIL/uL — ABNORMAL LOW (ref 3.87–5.11)
RDW: 13.2 % (ref 11.5–15.5)
WBC: 15 10*3/uL — ABNORMAL HIGH (ref 4.0–10.5)
nRBC: 0 % (ref 0.0–0.2)

## 2019-05-18 LAB — MAGNESIUM
Magnesium: 2.5 mg/dL — ABNORMAL HIGH (ref 1.7–2.4)
Magnesium: 2.4 mg/dL (ref 1.7–2.4)

## 2019-05-18 LAB — MRSA PCR SCREENING: MRSA by PCR: NEGATIVE

## 2019-05-18 LAB — D-DIMER, QUANTITATIVE: D-Dimer, Quant: 11.19 ug/mL-FEU — ABNORMAL HIGH (ref 0.00–0.50)

## 2019-05-18 MED ORDER — METHYLPREDNISOLONE SODIUM SUCC 40 MG IJ SOLR
20.0000 mg | INTRAMUSCULAR | Status: DC
  Administered 2019-05-19 – 2019-05-25 (×7): 20 mg via INTRAVENOUS
  Filled 2019-05-18 (×6): qty 1

## 2019-05-18 MED ORDER — FENTANYL CITRATE (PF) 100 MCG/2ML IJ SOLN
25.0000 ug | INTRAMUSCULAR | Status: DC | PRN
  Administered 2019-05-18 – 2019-05-19 (×2): 50 ug via INTRAVENOUS
  Filled 2019-05-18 (×2): qty 2

## 2019-05-18 MED ORDER — DEXTROSE 5 % IV SOLN
INTRAVENOUS | Status: DC
  Administered 2019-05-18: 13:00:00 via INTRAVENOUS

## 2019-05-18 MED ORDER — IPRATROPIUM-ALBUTEROL 0.5-2.5 (3) MG/3ML IN SOLN
3.0000 mL | Freq: Four times a day (QID) | RESPIRATORY_TRACT | Status: DC
  Administered 2019-05-18 – 2019-05-24 (×22): 3 mL via RESPIRATORY_TRACT
  Filled 2019-05-18 (×22): qty 3

## 2019-05-18 MED ORDER — ROCURONIUM BROMIDE 10 MG/ML (PF) SYRINGE
PREFILLED_SYRINGE | INTRAVENOUS | Status: AC
  Administered 2019-05-18: 70 mg
  Filled 2019-05-18: qty 10

## 2019-05-18 MED ORDER — NOREPINEPHRINE 4 MG/250ML-% IV SOLN
0.0000 ug/min | INTRAVENOUS | Status: DC
  Administered 2019-05-18: 10 ug/min via INTRAVENOUS
  Administered 2019-05-19: 28 ug/min via INTRAVENOUS
  Filled 2019-05-18 (×2): qty 250

## 2019-05-18 MED ORDER — AMIODARONE HCL IN DEXTROSE 360-4.14 MG/200ML-% IV SOLN
30.0000 mg/h | INTRAVENOUS | Status: DC
  Administered 2019-05-19 – 2019-05-21 (×5): 30 mg/h via INTRAVENOUS
  Filled 2019-05-18 (×5): qty 200

## 2019-05-18 MED ORDER — FENTANYL CITRATE (PF) 100 MCG/2ML IJ SOLN: 25.0000 ug | INTRAMUSCULAR | Status: DC | PRN

## 2019-05-18 MED ORDER — VITAL HIGH PROTEIN PO LIQD: 1000.0000 mL | ORAL | Status: DC

## 2019-05-18 MED ORDER — VITAL HIGH PROTEIN PO LIQD
1000.0000 mL | ORAL | Status: DC
  Administered 2019-05-18: 18:00:00 1000 mL

## 2019-05-18 MED ORDER — FREE WATER
200.0000 mL | Status: DC
  Administered 2019-05-18 – 2019-05-20 (×4): 200 mL

## 2019-05-18 MED ORDER — AMIODARONE LOAD VIA INFUSION
150.0000 mg | Freq: Once | INTRAVENOUS | Status: AC
  Administered 2019-05-18: 150 mg via INTRAVENOUS
  Filled 2019-05-18: qty 83.34

## 2019-05-18 MED ORDER — AMIODARONE HCL IN DEXTROSE 360-4.14 MG/200ML-% IV SOLN
60.0000 mg/h | INTRAVENOUS | Status: AC
  Administered 2019-05-18 (×2): 60 mg/h via INTRAVENOUS
  Filled 2019-05-18: qty 400

## 2019-05-18 MED ORDER — PRO-STAT SUGAR FREE PO LIQD
30.0000 mL | Freq: Two times a day (BID) | ORAL | Status: DC
  Administered 2019-05-18 – 2019-05-25 (×13): 30 mL
  Filled 2019-05-18 (×15): qty 30

## 2019-05-18 MED ORDER — FREE WATER: 200.0000 mL | Status: DC

## 2019-05-18 MED ORDER — ETOMIDATE 2 MG/ML IV SOLN
INTRAVENOUS | Status: AC
  Administered 2019-05-18: 20 mg
  Filled 2019-05-18: qty 20

## 2019-05-18 MED ORDER — MIDAZOLAM HCL 2 MG/2ML IJ SOLN
1.0000 mg | INTRAMUSCULAR | Status: DC | PRN
  Administered 2019-05-18: 1 mg via INTRAVENOUS
  Administered 2019-05-19: 2 mg via INTRAVENOUS
  Filled 2019-05-18: qty 2

## 2019-05-18 MED ORDER — DEXMEDETOMIDINE HCL IN NACL 400 MCG/100ML IV SOLN
0.0000 ug/kg/h | INTRAVENOUS | Status: DC
  Administered 2019-05-19: 0.8 ug/kg/h via INTRAVENOUS
  Administered 2019-05-19: 0.9 ug/kg/h via INTRAVENOUS
  Administered 2019-05-20 (×2): 0.8 ug/kg/h via INTRAVENOUS
  Administered 2019-05-20: 0.9 ug/kg/h via INTRAVENOUS
  Administered 2019-05-21: 04:00:00 0.7 ug/kg/h via INTRAVENOUS
  Administered 2019-05-21: 13:00:00 0.8 ug/kg/h via INTRAVENOUS
  Filled 2019-05-18 (×8): qty 100

## 2019-05-18 MED ORDER — PRO-STAT SUGAR FREE PO LIQD: 30.0000 mL | Freq: Two times a day (BID) | ORAL | Status: DC

## 2019-05-18 MED ORDER — IPRATROPIUM-ALBUTEROL 20-100 MCG/ACT IN AERS
1.0000 | INHALATION_SPRAY | Freq: Four times a day (QID) | RESPIRATORY_TRACT | Status: DC
  Filled 2019-05-18: qty 4

## 2019-05-18 MED ORDER — FENTANYL CITRATE (PF) 100 MCG/2ML IJ SOLN
INTRAMUSCULAR | Status: AC
  Administered 2019-05-18: 100 ug
  Filled 2019-05-18: qty 2

## 2019-05-18 MED ORDER — SODIUM CHLORIDE 0.9 % IV BOLUS
500.0000 mL | Freq: Once | INTRAVENOUS | Status: AC
  Administered 2019-05-18: 16:00:00 500 mL via INTRAVENOUS

## 2019-05-18 MED ORDER — MIDAZOLAM HCL 2 MG/2ML IJ SOLN
1.0000 mg | INTRAMUSCULAR | Status: DC | PRN
  Filled 2019-05-18: qty 2

## 2019-05-18 MED ORDER — MIDAZOLAM HCL 2 MG/2ML IJ SOLN
INTRAMUSCULAR | Status: AC
  Administered 2019-05-18: 2 mg
  Filled 2019-05-18: qty 4

## 2019-05-18 NOTE — Procedures (Signed)
Extubation Procedure Note  Patient Details:   Name: DRU LAUREL DOB: 01-13-1946 MRN: [id number]   Airway Documentation:    Vent end date: 05/18/19 Vent end time: 0825   Evaluation  O2 sats: stable throughout Complications: No apparent complications Patient did tolerate procedure well. Bilateral Breath Sounds: Clear, Diminished   No   Positive cuff leak noted. Patient placed on [place] 3 L with humidity. No stridor noted.  Patient not currently following commands to use the incentive spirometer.  RN and RT will attempt as she becomes more alert.  Bayard Beaver 05/18/2019, 8:33 AM

## 2019-05-18 NOTE — Progress Notes (Signed)
OT Cancellation Note  Patient Details Name: Betty [name] MRN: [id number] DOB: [birth year]   Cancelled Treatment:    Reason Eval/Treat Not Completed: Patient not medically ready;Fatigue/lethargy limiting ability to participate;Patient's level of consciousness(Pt on precedex and with decreased arousal. Will return as schedule allows. Thank you.)  [place], OTR/L Acute Rehab Pager: [phone] Office: [phone] 05/18/2019, 1:47 PM

## 2019-05-18 NOTE — Progress Notes (Signed)
[facility]  Encephalopathic Not protecting airway Hypotensive afib with rvr  Intubate now leveophed Amiodarone Check CVP  Roselie Awkward, MD [place] [facility] Pager: [phone] Cell: [phone] If no response, call [phone]

## 2019-05-18 NOTE — Progress Notes (Signed)
PT Cancellation Note  Patient Details Name: Betty [name] MRN: [id number] DOB: 03/24/1946   Cancelled Treatment:    Reason Eval/Treat Not Completed: Patient's level of consciousness;Patient not medically ready  Patient currently remains on precedex and is very lethargic. Upon attempting to arouse patient, her RR increased to 30s with "belly breathing." Evaluation deferred at this time. Will reattempt next day as appropriate.  Jeanie Cooks Elizbeth Posa, PT 05/18/2019, 1:54 PM

## 2019-05-18 NOTE — Procedures (Signed)
Intubation Procedure Note SHAWNY BORKOWSKI [id number] [birth year]  Procedure: Intubation Indications: Airway protection and maintenance  Procedure Details Consent: Unable to obtain consent because of emergent medical necessity. Time Out: Verified patient identification, verified procedure, site/side was marked, verified correct patient position, special equipment/implants available, medications/allergies/relevent history reviewed, required imaging and test results available.  Performed  Drugs Etomidate 20mg  IV, Rocuronium 70mg  IV, Versed 2mg  IV, Fentanyl 24mcg IV DL x 1 with GS 3 blade Grade 1 view 7.5 ET tube passed through cords under direct visualization Placement confirmed with bilateral breath sounds, positive EtCO2 change and smoke in tube   Evaluation Hemodynamic Status: BP stable throughout; O2 sats: stable throughout Patient's Current Condition: stable Complications: No apparent complications Patient did tolerate procedure well. Chest X-ray ordered to verify placement.  CXR: pending.   Simonne Maffucci 05/18/2019

## 2019-05-18 NOTE — Progress Notes (Signed)
Md informed. 500 bolus ordered

## 2019-05-18 NOTE — Progress Notes (Signed)
[facility] Brief Note  Seen on rounds Passing SBT Less anxious Follows basic commands Extubate.  Roselie Awkward, MD [place] [facility] Pager: [phone] Cell: [phone] If no response, call [phone]

## 2019-05-18 NOTE — Progress Notes (Signed)
Spoke with son and updated on his mom's progress and plan of care. He was happy with the update and how she is progressing.

## 2019-05-18 NOTE — Progress Notes (Signed)
Pt BP low, sedation limited d/t BP, pt having irregular breathing patterns and increased RR and HR.  RT changed mode to [facility] to allow pt to pull larger volumes if needed.  RT also decreased trigger so pt would have to pull harder to make a breath.  Pt good at -3, but -5 caused pt to fight harder and peep to decrease.  At this time pt looks somewhat more comfortable.  RN aware of changes at this time and RT will obtain ABg within one hour.  RT will continue to monitor.

## 2019-05-18 NOTE — Progress Notes (Signed)
PROGRESS NOTE  Betty [name] TKZ:[id number] DOB: Feb 20, 1946 DOA: 05/13/2019  PCP: Josetta Huddle, MD  Brief History/Interval Summary: 73 year old female with history of coronary artery disease, non-small cell lung cancer in 2016, hypertension, hyperlipidemia, dementia, COPD, chronic kidney disease stage IV, chronic combined CHF who was brought to the hospital by the patient's son due to increased confusion and weakness.  She was diagnosed with coronavirus in the ED on 7/9.  She was also found to be hypoxic requiring 2 L nasal cannula.  She was admitted to the hospital and placed on steroids along with Remdesivir.  Reason for Visit: Acute respiratory disease due to COVID-19  Consultants: Pulmonology  Procedures:  Intubation 7/10 Central line placement right IJ 7/10  Antibiotics: Anti-infectives (From admission, onward)   Start     Dose/Rate Route Frequency Ordered Stop   05/15/19 1230  remdesivir 100 mg in sodium chloride 0.9 % 250 mL IVPB     100 mg 500 mL/hr over 30 Minutes Intravenous Every 24 hours 05/14/19 1148 05/19/19 1229   05/14/19 1230  remdesivir 200 mg in sodium chloride 0.9 % 250 mL IVPB     200 mg 500 mL/hr over 30 Minutes Intravenous Once 05/14/19 1148 05/14/19 1551       Subjective/Interval History: Patient was noted to be weaning well this morning.  She was subsequently extubated.       Assessment/Plan:  Acute Hypoxic Resp. Failure due to Acute Covid 19 Viral Illness  Vent Mode: PSV;CPAP FiO2 (%):  [30 %] 30 % Set Rate:  [24 bmp] 24 bmp Vt Set:  [430 mL] 430 mL PEEP:  [5 cmH20] 5 cmH20 Pressure Support:  [5 cmH20] 5 cmH20 Plateau Pressure:  [10 cmH20-14 cmH20] 10 cmH20     Component Value Date/Time   PHART 7.440 05/15/2019 2049   PCO2ART 35.0 05/15/2019 2049   PO2ART 447.0 (H) 05/15/2019 2049   HCO3 23.7 05/15/2019 2049   TCO2 25 05/15/2019 2049   ACIDBASEDEF 3.0 (H) 05/15/2019 1839   O2SAT 100.0 05/15/2019 2049    COVID-19 Labs  Recent Labs   05/16/19 0500 05/18/19 0410  DDIMER 7.58* 11.19*  FERRITIN 352* 347*  LDH 460*  --   CRP 8.2* 3.5*    Lab Results  Component Value Date   SARSCOV2NAA POSITIVE (A) 05/14/2019     Fever: Patient has been afebrile Oxygen requirements: Extubated this morning.  Currently on nasal cannula saturating in the early 90s.    Antibiotics: No antibacterials Remdesivir: Day 5 today Steroids: Continue to taper down steroids. Diuretics: Given Lasix x1 on 7/10.  Not continued due to increase in creatinine Actemra: Actemra x1 on 7/10 Convalescent Plasma: Not yet Vitamin C and Zinc: Continue DVT Prophylaxis: Heparin 7500 units every 8 hours  Patient's respiratory status appears to have improved.  She has been extubated this morning.  Continue to monitor closely.  Pulmonology continues to follow.  CRP is improved to 3.5.  D-dimer gone up to 11.19.  Ferritin 347.  Continue Remdesivir.  Steroids to be tapered down due to encephalopathy.  She was given 1 dose of Actemra but not repeated.  Leukocytosis is due to steroids.  Hypernatremia Most likely due to poor oral intake.  Initially free water was prescribed however her OG tube has been removed with extubation.  She will be given D5 infusion for the next 24 hours.  Recheck labs later today.  Chronic systolic CHF Most recent echocardiogram done in June 2020 showed a EF of 30 to 35%.  Seems to be stable from a volume standpoint.  Strict ins and outs and daily weights.  Seems to be auto diuresing well.  Weight is stable.  Acute on chronic kidney disease stage IV Baseline creatinine around 2.0-3.0.  Renal function seems to be close to baseline.  Increase in BUN noted.  Will gently hydrate her with D5 water as discussed above.  Sodium also noted to be elevated as discussed above.  She does have good urine output.  Hyperglycemia due to steroids Elevated CBGs are due to steroids.  She has been on multiple courses of prednisone in the last 1 to 2 months for  acute gout which is the likely reason for her hyperglycemia.  And also likely reason for her elevated HbA1c.  Continue long-acting insulin and SSI for now.  CBG should improve as steroid is tapered down.     Essential hypertension Blood pressures have improved.  Mildly hypertensive at times.  Continue to hold her antihypertensives.  Hydralazine as needed.    History of dementia/acute metabolic encephalopathy Appears to be mild dementia.  Patient is able to live independently.  Apparently has poor short-term memory.  Continue to monitor.  Patient has encephalopathy most likely due to acute illness and medications.  Continue to monitor.  No obvious deficits noted.  History of non-small cell lung cancer and COPD She is status post right lower lobe lobectomy and has completed 4 cycles of adjuvant chemotherapy.  Lung cancer appears to be in remission.    History of gout Initially it was thought that patient is on chronic prednisone at home but review of previous notes did not mention prednisone.  Discussed on steroids with patient's son.  Apparently over the last 1 to 2 months she has had issues with acute gout for which she was prescribed courses of steroids.  She does not take steroid on a regular basis.  However she was on a course of prednisone at the time of admission.  Nutrition Speech therapy to evaluate   DVT Prophylaxis: Subcutaneous heparin PUD Prophylaxis: Protonix Code Status: Full code Family Communication: Discussed with son on daily basis. Disposition Plan: Will remain in ICU   Medications:  Scheduled: . aspirin EC  81 mg Oral QHS  . chlorhexidine  15 mL Mouth/Throat BID  . Chlorhexidine Gluconate Cloth  6 each Topical Daily  . clonazepam  1 mg Oral BID  . docusate  100 mg Oral BID  . donepezil  10 mg Oral QHS  . febuxostat  40 mg Oral Daily  . free water  200 mL Per Tube Q4H  . heparin injection (subcutaneous)  7,500 Units Subcutaneous Q8H  . insulin aspart  0-20 Units  Subcutaneous Q4H  . insulin aspart  6 Units Subcutaneous Q4H  . insulin detemir  15 Units Subcutaneous BID  . ipratropium-albuterol  3 mL Nebulization Q6H  . methylPREDNISolone (SOLU-MEDROL) injection  40 mg Intravenous Q24H  . pantoprazole sodium  40 mg Per Tube Q1200  . QUEtiapine  50 mg Oral BID  . sodium chloride flush  3 mL Intravenous Q12H   Continuous: . dexmedetomidine (PRECEDEX) IV infusion 0.2 mcg/kg/hr (05/18/19 0900)  . dextrose    . feeding supplement (VITAL AF 1.2 CAL) 1,000 mL (05/18/19 0504)  . remdesivir 100 mg in NS 250 mL Stopped (05/17/19 1305)   ZOX:[id number], bisacodyl, hydrALAZINE, ondansetron **OR** ondansetron (ZOFRAN) IV, oxyCODONE, polyethylene glycol   Objective:  Vital Signs  Vitals:   05/18/19 0700 05/18/19 0739 05/18/19 0757 05/18/19 0827  BP: 120/75 120/75  121/76  Pulse: 89 91  (!) 114  Resp: (!) 27 (!) 23  (!) 26  Temp:    98 F (36.7 C)  TempSrc:    Axillary  SpO2: 98% 98% 98% 95%  Weight:      Height:        Intake/Output Summary (Last 24 hours) at 05/18/2019 0920 Last data filed at 05/18/2019 0900 Gross per 24 hour  Intake 1621.15 ml  Output 2400 ml  Net -778.85 ml   Filed Weights   05/16/19 0500 05/17/19 0433 05/18/19 0500  Weight: 57.4 kg 59.7 kg 57.7 kg    General appearance: Extubated this morning.  Lying on the bed with eyes closed.  In no distress Resp: Mildly tachypneic.  Coarse breath sounds bilaterally with crackles at the bases bilaterally.  No wheezing or rhonchi. Cardio: S1-S2 is normal regular.  No S3-S4.  No rubs murmurs or bruit GI: Abdomen is soft.  Nontender nondistended.  Bowel sounds are present normal.  No masses organomegaly Extremities: No edema.  Neurologic:  No focal neurological deficits.     Lab Results:  Data Reviewed: I have personally reviewed following labs and imaging studies  CBC: Recent Labs  Lab 05/13/19 2354 05/15/19 0400 05/15/19 1839 05/15/19 2049 05/16/19 0500 05/17/19 0413  05/18/19 0410  WBC 10.3 6.8  --   --  10.9* 11.2* 15.0*  HGB 12.8 12.8 11.2* 11.6* 11.2* 11.2* 11.1*  HCT 39.0 38.2 33.0* 34.0* 33.7* 34.3* 34.9*  MCV 90.5 89.0  --   --  89.4 89.8 90.4  PLT 322 382  --   --  370 315 253    Basic Metabolic Panel: Recent Labs  Lab 05/13/19 2354 05/15/19 0400 05/15/19 1839 05/15/19 2049 05/15/19 2103 05/16/19 0500 05/17/19 0413 05/18/19 0410  NA 137 140 139 140  --  143 146* 153*  K 4.4 4.7 4.3 4.4  --  5.2* 4.2 4.2  CL 103 103  --   --   --  107 110 118*  CO2 17* 17*  --   --   --  23 23 22   GLUCOSE 157* 133*  --   --   --  190* 322* 301*  BUN 60* 67*  --   --   --  99* 70* 103*  CREATININE 3.27* 2.93*  --   --   --  3.19* 2.81* 2.68*  CALCIUM 9.0 9.1  --   --   --  8.8* 8.8* 8.9  MG  --  2.3  --   --  2.3 2.4  --   --   PHOS  --  6.1*  --   --  5.4* 5.6* 4.8* 3.9    GFR: Estimated Creatinine Clearance: 15.9 mL/min (A) (by C-G formula based on SCr of 2.68 mg/dL (H)).  Liver Function Tests: Recent Labs  Lab 05/13/19 2354 05/15/19 0400 05/16/19 0500 05/17/19 0413 05/18/19 0410  AST 32 35 32 23 26  ALT 22 20 20 18 18   ALKPHOS 76 65 59 58 52  BILITOT 0.7 0.7 0.3 0.1* 0.4  PROT 7.4 7.9 6.9 6.5 6.3*  ALBUMIN 3.4* 3.3* 3.0* 3.1* 2.9*    Cardiac Enzymes: Recent Labs  Lab 05/13/19 2354  CKTOTAL 189    HbA1C: Recent Labs    05/16/19 0500  HGBA1C 6.9*    CBG: Recent Labs  Lab 05/17/19 1535 05/17/19 1951 05/17/19 2326 05/18/19 0324 05/18/19 0736  GLUCAP 106* 188* 230* 295* 280*  Anemia Panel: Recent Labs    05/16/19 0500 05/18/19 0410  FERRITIN 352* 347*    Recent Results (from the past 240 hour(s))  SARS Coronavirus 2 (CEPHEID - Performed in [facility] hospital lab), Hosp Order     Status: Abnormal   Collection Time: 05/14/19  5:20 AM   Specimen: Nasopharyngeal Swab  Result Value Ref Range Status   SARS Coronavirus 2 POSITIVE (A) NEGATIVE Final    Comment: RESULT CALLED TO, READ BACK BY AND VERIFIED  WITH: RN C CHRISCO @0630  05/14/19 BY S GEZAHEGN (NOTE) If result is NEGATIVE SARS-CoV-2 target nucleic acids are NOT DETECTED. The SARS-CoV-2 RNA is generally detectable in upper and lower  respiratory specimens during the acute phase of infection. The lowest  concentration of SARS-CoV-2 viral copies this assay can detect is 250  copies / mL. A negative result does not preclude SARS-CoV-2 infection  and should not be used as the sole basis for treatment or other  patient management decisions.  A negative result may occur with  improper specimen collection / handling, submission of specimen other  than nasopharyngeal swab, presence of viral mutation(s) within the  areas targeted by this assay, and inadequate number of viral copies  (<250 copies / mL). A negative result must be combined with clinical  observations, patient history, and epidemiological information. If result is POSITIVE SARS-CoV-2 target nucleic acids are DETECTED . The SARS-CoV-2 RNA is generally detectable in upper and lower  respiratory specimens during the acute phase of infection.  Positive  results are indicative of active infection with SARS-CoV-2.  Clinical  correlation with patient history and other diagnostic information is  necessary to determine patient infection status.  Positive results do  not rule out bacterial infection or co-infection with other viruses. If result is PRESUMPTIVE POSTIVE SARS-CoV-2 nucleic acids MAY BE PRESENT.   A presumptive positive result was obtained on the submitted specimen  and confirmed on repeat testing.  While 2019 novel coronavirus  (SARS-CoV-2) nucleic acids may be present in the submitted sample  additional confirmatory testing may be necessary for epidemiological  and / or clinical management purposes  to differentiate between  SARS-CoV-2 and other Sarbecovirus currently known to infect humans.  If clinically indicated additional testing with an alternate test  methodology  [phone] ) is advised. The SARS-CoV-2 RNA is generally  detectable in upper and lower respiratory specimens during the acute  phase of infection. The expected result is Negative. Fact Sheet for Patients:  StrictlyIdeas.no Fact Sheet for Healthcare Providers: BankingDealers.co.za This test is not yet approved or cleared by the [place] FDA and has been authorized for detection and/or diagnosis of SARS-CoV-2 by FDA under an Emergency Use Authorization (EUA).  This EUA will remain in effect (meaning this test can be used) for the duration of the COVID-19 declaration under Section 564(b)(1) of the Act, 21 U.S.C. section 360bbb-3(b)(1), unless the authorization is terminated or revoked sooner. Performed at [facility], [place] [address], [place], [place] [address]   Urine culture     Status: Abnormal   Collection Time: 05/14/19 11:30 AM   Specimen: Urine, Clean Catch  Result Value Ref Range Status   Specimen Description   Final    URINE, CLEAN CATCH Performed at [facility], [place] [address] [place], [place] [address]    Special Requests   Final    NONE Performed at [facility], [place] [address], [place],  [address]    Culture (A)  Final    10,000 COLONIES/mL MULTIPLE SPECIES PRESENT, SUGGEST RECOLLECTION   Report Status 05/16/2019 FINAL  Final  MRSA PCR Screening     Status: None   Collection Time: 05/18/19  5:16 AM   Specimen: Nasal Mucosa; Nasopharyngeal  Result Value Ref Range Status   MRSA by PCR NEGATIVE NEGATIVE Final    Comment:        The GeneXpert MRSA Assay (FDA approved for NASAL specimens only), is one component of a comprehensive MRSA colonization surveillance program. It is not intended to diagnose MRSA infection nor to guide or monitor treatment for MRSA infections. Performed at [facility], [place] [address], [place], [place] [address]        Radiology Studies: Dg Chest Port 1 View  Result Date: 05/16/2019 CLINICAL DATA:  Check endotracheal tube position EXAM: PORTABLE CHEST 1 VIEW COMPARISON:  05/15/2019 FINDINGS: Cardiac shadow is stable. Pacing device is again seen. Right jugular central line is stable. Endotracheal tube is noted approximately 7 cm above the carina slightly higher than that seen on the prior exam. Stable scarring in the bases is seen. No bony abnormality is noted. Gastric catheter is noted within the stomach. IMPRESSION: Tubes and lines as described. Electronically Signed   By: Inez Catalina M.D.   On: 05/16/2019 23:53       LOS: 4 days   [facility] Pager on [email or web address]  05/18/2019, 9:20 AM

## 2019-05-18 NOTE — Progress Notes (Signed)
SLP Cancellation Note  Patient Details Name: Betty [name] MRN: [id number] DOB: [birth year]   Cancelled evaluation:    Pt lethargic; remains on precedex.  Will attempt swallow assessment next date.                                                                                               [name] Laurice 05/18/2019, 3:51 PM   Estill Bamberg L. Tivis Ringer, [place] Office number [phone] Pager [phone]

## 2019-05-18 NOTE — Progress Notes (Signed)
NAME:  Betty [name], MRN:  [id number], DOB:  December 29, 1945, LOS: 4 ADMISSION DATE:  05/13/2019, CONSULTATION DATE: July 10 REFERRING MD: Cruzita Lederer, CHIEF COMPLAINT: Dyspnea  Brief History   73 year old female with multiple medical problems admitted on July 9 with worsening dyspnea in setting of severe acute respiratory failure with hypoxemia due to COVID-19 pneumonia.  Past Medical History  COPD NSCLC 2683 CKD Systolic heart failure Hypertension  Significant Hospital Events   7/9 admission 7/10 transfer to ICU July 11 attempted to self extubate  Consults:  PCCM  Procedures:  7/10 ETT > 7/10 R IJ CVL >   Significant Diagnostic Tests:  04/2019 LVEF 30-35%, LVH, LV hypokinesis, RV normal function, minimal valve insufficiencies  Micro Data:  7/9 SARS COV2 >  7/9 Urine culture >   Antimicrobials:  7/9 remdesivir >  7/9 solumedrol >    Interim history/subjective:   Weaning well Less anxious  Objective   Blood pressure 120/75, pulse 89, temperature 97.6 F (36.4 C), temperature source Oral, resp. rate (!) 27, height 5\' 4"  (1.626 m), weight 57.7 kg, SpO2 98 %. CVP:  [0 mmHg-49 mmHg] 4 mmHg  Vent Mode: PRVC FiO2 (%):  [30 %] 30 % Set Rate:  [24 bmp] 24 bmp Vt Set:  [430 mL] 430 mL PEEP:  [5 cmH20] 5 cmH20 Pressure Support:  [5 cmH20] 5 cmH20 Plateau Pressure:  [10 cmH20-14 cmH20] 10 cmH20   Intake/Output Summary (Last 24 hours) at 05/18/2019 0803 Last data filed at 05/18/2019 0700 Gross per 24 hour  Intake 1519.12 ml  Output 2250 ml  Net -730.88 ml   Filed Weights   05/16/19 0500 05/17/19 0433 05/18/19 0500  Weight: 57.4 kg 59.7 kg 57.7 kg    Examination:  General:  In bed on vent HENT: NCAT ETT in place PULM: CTA B, vent supported breathing CV: RRR, no mgr GI: BS+, soft, nontender MSK: normal bulk and tone Neuro: sedated on vent   7/10 chest x-ray images independently reviewed showing central line and endotracheal tube in place, essentially clear lungs,  there is an interstitial infiltrate in the bases of both lungs particularly when compared to images from June 2019, emphysema  Resolved Hospital Problem list     Assessment & Plan:  Acute respiratory failure with hypoxemia: No doubt COVID is at play but she does not appear to have severe pneumonia as other patients have had.  Suspect COPD contributing Wean to extubate now Aspiration precautions afterwards Monitor closely in ICU environment afterwards Maintain precedex briefly after extubation for anxiety Continue ipratropium/albuterol> combivent Continue solumedrol Continue remdesivir  Anxiety:  Wean off precedex Continue clonazepam/quetiapine  Mild hypotension, improving, sedation related Hold BIDIL  COPD combivent q6h  Congestive heart failure: Consider adding back home bidil in a few days  CKD, unclear stage Monitor BMET and UOP Replace electrolytes as needed    Best practice:  Diet: tube feeding Pain/Anxiety/Delirium protocol (if indicated): n/a VAP protocol (if indicated): n/a DVT prophylaxis: sub q heparin GI prophylaxis: Pantoprazole for stress ulcer prophylaxis Glucose control: SSI per TRH Mobility: bed rest, range of motion exercises OK Code Status: full, agree this needs to be  Family Communication: per TRH Disposition:   Labs   CBC: Recent Labs  Lab 05/13/19 2354 05/15/19 0400 05/15/19 1839 05/15/19 2049 05/16/19 0500 05/17/19 0413 05/18/19 0410  WBC 10.3 6.8  --   --  10.9* 11.2* 15.0*  HGB 12.8 12.8 11.2* 11.6* 11.2* 11.2* 11.1*  HCT 39.0 38.2 33.0* 34.0* 33.7* 34.3*  34.9*  MCV 90.5 89.0  --   --  89.4 89.8 90.4  PLT 322 382  --   --  370 315 878    Basic Metabolic Panel: Recent Labs  Lab 05/13/19 2354 05/15/19 0400 05/15/19 1839 05/15/19 2049 05/15/19 2103 05/16/19 0500 05/17/19 0413 05/18/19 0410  NA 137 140 139 140  --  143 146* 153*  K 4.4 4.7 4.3 4.4  --  5.2* 4.2 4.2  CL 103 103  --   --   --  107 110 118*  CO2 17* 17*   --   --   --  23 23 22   GLUCOSE 157* 133*  --   --   --  190* 322* 301*  BUN 60* 67*  --   --   --  99* 70* 103*  CREATININE 3.27* 2.93*  --   --   --  3.19* 2.81* 2.68*  CALCIUM 9.0 9.1  --   --   --  8.8* 8.8* 8.9  MG  --  2.3  --   --  2.3 2.4  --   --   PHOS  --  6.1*  --   --  5.4* 5.6* 4.8* 3.9   GFR: Estimated Creatinine Clearance: 15.9 mL/min (A) (by C-G formula based on SCr of 2.68 mg/dL (H)). Recent Labs  Lab 05/14/19 1310 05/15/19 0400 05/16/19 0500 05/17/19 0413 05/18/19 0410  PROCALCITON 0.59  --   --   --   --   WBC  --  6.8 10.9* 11.2* 15.0*    Liver Function Tests: Recent Labs  Lab 05/13/19 2354 05/15/19 0400 05/16/19 0500 05/17/19 0413 05/18/19 0410  AST 32 35 32 23 26  ALT 22 20 20 18 18   ALKPHOS 76 65 59 58 52  BILITOT 0.7 0.7 0.3 0.1* 0.4  PROT 7.4 7.9 6.9 6.5 6.3*  ALBUMIN 3.4* 3.3* 3.0* 3.1* 2.9*   No results for input(s): LIPASE, AMYLASE in the last 168 hours. No results for input(s): AMMONIA in the last 168 hours.  ABG    Component Value Date/Time   PHART 7.440 05/15/2019 2049   PCO2ART 35.0 05/15/2019 2049   PO2ART 447.0 (H) 05/15/2019 2049   HCO3 23.7 05/15/2019 2049   TCO2 25 05/15/2019 2049   ACIDBASEDEF 3.0 (H) 05/15/2019 1839   O2SAT 100.0 05/15/2019 2049     Coagulation Profile: No results for input(s): INR, PROTIME in the last 168 hours.  Cardiac Enzymes: Recent Labs  Lab 05/13/19 2354  CKTOTAL 189    HbA1C: Hgb A1c MFr Bld  Date/Time Value Ref Range Status  05/16/2019 05:00 AM 6.9 (H) 4.8 - 5.6 % Final    Comment:    (NOTE) Pre diabetes:          5.7%-6.4% Diabetes:              >6.4% Glycemic control for   <7.0% adults with diabetes     CBG: Recent Labs  Lab 05/17/19 1535 05/17/19 1951 05/17/19 2326 05/18/19 0324 05/18/19 0736  GLUCAP 106* 188* 230* 295* 280*     Critical care time: 40 minutes     Roselie Awkward, MD [place] [facility] Pager: [phone] Cell: [phone] If no response, call  [phone]

## 2019-05-19 ENCOUNTER — Inpatient Hospital Stay (HOSPITAL_COMMUNITY): Payer: Medicare Other

## 2019-05-19 DIAGNOSIS — J9601 Acute respiratory failure with hypoxia: Secondary | ICD-10-CM

## 2019-05-19 DIAGNOSIS — R579 Shock, unspecified: Secondary | ICD-10-CM

## 2019-05-19 LAB — POCT I-STAT EG7
Acid-base deficit: 5 mmol/L — ABNORMAL HIGH (ref 0.0–2.0)
Bicarbonate: 19 mmol/L — ABNORMAL LOW (ref 20.0–28.0)
Calcium, Ion: 1.21 mmol/L (ref 1.15–1.40)
HCT: 29 % — ABNORMAL LOW (ref 36.0–46.0)
Hemoglobin: 9.9 g/dL — ABNORMAL LOW (ref 12.0–15.0)
O2 Saturation: 75 %
Patient temperature: 96.5
Potassium: 4.2 mmol/L (ref 3.5–5.1)
Sodium: 150 mmol/L — ABNORMAL HIGH (ref 135–145)
TCO2: 20 mmol/L — ABNORMAL LOW (ref 22–32)
pCO2, Ven: 28.6 mmHg — ABNORMAL LOW (ref 44.0–60.0)
pH, Ven: 7.425 (ref 7.250–7.430)
pO2, Ven: 36 mmHg (ref 32.0–45.0)

## 2019-05-19 LAB — LACTIC ACID, PLASMA
Lactic Acid, Venous: 4 mmol/L (ref 0.5–1.9)
Lactic Acid, Venous: 3.7 mmol/L (ref 0.5–1.9)

## 2019-05-19 LAB — COMPREHENSIVE METABOLIC PANEL
ALT: 21 U/L (ref 0–44)
AST: 34 U/L (ref 15–41)
Albumin: 3.3 g/dL — ABNORMAL LOW (ref 3.5–5.0)
Alkaline Phosphatase: 55 U/L (ref 38–126)
Anion gap: 21 — ABNORMAL HIGH (ref 5–15)
BUN: 99 mg/dL — ABNORMAL HIGH (ref 8–23)
CO2: 18 mmol/L — ABNORMAL LOW (ref 22–32)
Calcium: 8.9 mg/dL (ref 8.9–10.3)
Chloride: 117 mmol/L — ABNORMAL HIGH (ref 98–111)
Creatinine, Ser: 3.25 mg/dL — ABNORMAL HIGH (ref 0.44–1.00)
GFR calc Af Amer: 15 mL/min — ABNORMAL LOW (ref >60)
GFR calc non Af Amer: 13 mL/min — ABNORMAL LOW (ref >60)
Glucose, Bld: 27 mg/dL — CL (ref 70–99)
Potassium: 3.9 mmol/L (ref 3.5–5.1)
Sodium: 156 mmol/L — ABNORMAL HIGH (ref 135–145)
Total Bilirubin: 0.2 mg/dL — ABNORMAL LOW (ref 0.3–1.2)
Total Protein: 5.8 g/dL — ABNORMAL LOW (ref 6.5–8.1)

## 2019-05-19 LAB — PHOSPHORUS
Phosphorus: 2.8 mg/dL (ref 2.5–4.6)
Phosphorus: 5.2 mg/dL — ABNORMAL HIGH (ref 2.5–4.6)

## 2019-05-19 LAB — GLUCOSE, CAPILLARY
Glucose-Capillary: 102 mg/dL — ABNORMAL HIGH (ref 70–99)
Glucose-Capillary: 132 mg/dL — ABNORMAL HIGH (ref 70–99)
Glucose-Capillary: 147 mg/dL — ABNORMAL HIGH (ref 70–99)
Glucose-Capillary: 203 mg/dL — ABNORMAL HIGH (ref 70–99)
Glucose-Capillary: 218 mg/dL — ABNORMAL HIGH (ref 70–99)
Glucose-Capillary: 93 mg/dL (ref 70–99)

## 2019-05-19 LAB — CBC
HCT: 32.8 % — ABNORMAL LOW (ref 36.0–46.0)
Hemoglobin: 10.4 g/dL — ABNORMAL LOW (ref 12.0–15.0)
MCH: 29.8 pg (ref 26.0–34.0)
MCHC: 31.7 g/dL (ref 30.0–36.0)
MCV: 94 fL (ref 80.0–100.0)
Platelets: 336 10*3/uL (ref 150–400)
RBC: 3.49 MIL/uL — ABNORMAL LOW (ref 3.87–5.11)
RDW: 13.7 % (ref 11.5–15.5)
WBC: 52.7 10*3/uL (ref 4.0–10.5)
nRBC: 0.2 % (ref 0.0–0.2)

## 2019-05-19 LAB — MAGNESIUM
Magnesium: 1.9 mg/dL (ref 1.7–2.4)
Magnesium: 2 mg/dL (ref 1.7–2.4)

## 2019-05-19 LAB — C-REACTIVE PROTEIN: CRP: 4.3 mg/dL — ABNORMAL HIGH (ref <1.0)

## 2019-05-19 LAB — FERRITIN: Ferritin: 165 ng/mL (ref 11–307)

## 2019-05-19 LAB — D-DIMER, QUANTITATIVE: D-Dimer, Quant: 4.9 ug/mL-FEU — ABNORMAL HIGH (ref 0.00–0.50)

## 2019-05-19 MED ORDER — FENTANYL BOLUS VIA INFUSION
25.0000 ug | INTRAVENOUS | Status: DC | PRN
  Administered 2019-05-22 – 2019-05-26 (×16): 25 ug via INTRAVENOUS
  Filled 2019-05-19: qty 25

## 2019-05-19 MED ORDER — PANTOPRAZOLE SODIUM 40 MG IV SOLR
40.0000 mg | Freq: Two times a day (BID) | INTRAVENOUS | Status: DC
  Administered 2019-05-19 – 2019-05-25 (×14): 40 mg via INTRAVENOUS
  Filled 2019-05-19 (×13): qty 40

## 2019-05-19 MED ORDER — PIPERACILLIN-TAZOBACTAM IN DEX 2-0.25 GM/50ML IV SOLN
2.2500 g | Freq: Three times a day (TID) | INTRAVENOUS | Status: AC
  Administered 2019-05-19 – 2019-05-25 (×20): 2.25 g via INTRAVENOUS
  Filled 2019-05-19 (×21): qty 50

## 2019-05-19 MED ORDER — MIDAZOLAM HCL 2 MG/2ML IJ SOLN
1.0000 mg | INTRAMUSCULAR | Status: AC | PRN
  Administered 2019-05-19 – 2019-05-23 (×3): 2 mg via INTRAVENOUS
  Filled 2019-05-19 (×3): qty 2

## 2019-05-19 MED ORDER — ALBUMIN HUMAN 25 % IV SOLN
25.0000 g | Freq: Once | INTRAVENOUS | Status: AC
  Administered 2019-05-19: 25 g via INTRAVENOUS
  Filled 2019-05-19: qty 100

## 2019-05-19 MED ORDER — FENTANYL CITRATE (PF) 100 MCG/2ML IJ SOLN: 25.0000 ug | Freq: Once | INTRAMUSCULAR | Status: DC

## 2019-05-19 MED ORDER — VITAL AF 1.2 CAL PO LIQD
1000.0000 mL | ORAL | Status: DC
  Administered 2019-05-20 – 2019-05-25 (×5): 1000 mL
  Filled 2019-05-19 (×2): qty 1000

## 2019-05-19 MED ORDER — HEPARIN SODIUM (PORCINE) 10000 UNIT/ML IJ SOLN: 7500.0000 [IU] | Freq: Three times a day (TID) | INTRAMUSCULAR | Status: DC

## 2019-05-19 MED ORDER — LACTATED RINGERS IV BOLUS
250.0000 mL | Freq: Once | INTRAVENOUS | Status: AC
  Administered 2019-05-19: 250 mL via INTRAVENOUS

## 2019-05-19 MED ORDER — NOREPINEPHRINE 16 MG/250ML-% IV SOLN
0.0000 ug/min | INTRAVENOUS | Status: DC
  Administered 2019-05-19: 34 ug/min via INTRAVENOUS
  Administered 2019-05-19: 22 ug/min via INTRAVENOUS
  Administered 2019-05-20: 03:00:00 19 ug/min via INTRAVENOUS
  Filled 2019-05-19 (×4): qty 250

## 2019-05-19 MED ORDER — FENTANYL 2500MCG IN NS 250ML (10MCG/ML) PREMIX INFUSION
0.0000 ug/h | INTRAVENOUS | Status: DC
  Administered 2019-05-19: 11:00:00 100 ug/h via INTRAVENOUS
  Administered 2019-05-20: 21:00:00 25 ug/h via INTRAVENOUS
  Administered 2019-05-20: 08:00:00 100 ug/h via INTRAVENOUS
  Administered 2019-05-23: 06:00:00 50 ug/h via INTRAVENOUS
  Administered 2019-05-25: 75 ug/h via INTRAVENOUS
  Filled 2019-05-19 (×6): qty 250

## 2019-05-19 NOTE — Progress Notes (Signed)
Pharmacy Antibiotic Note  Betty [name] is a 73 y.o. female admitted on 05/13/2019 with COVID-19 pneumonia.  Pharmacy has been consulted for Zosyn dosing for intra-abdominal infection.  Plan: Zosyn 2.25g IV q8h Continue to follow up renal function, cultures, clinical condition.  Height: 5\' 4"  (162.6 cm) Weight: 126 lb 8.7 oz (57.4 kg) IBW/kg (Calculated) : 54.7  Temp (24hrs), Avg:98 F (36.7 C), Min:96.6 F (35.9 C), Max:99.3 F (37.4 C)  Recent Labs  Lab 05/15/19 0400 05/16/19 0500 05/17/19 0413 05/18/19 0410 05/18/19 1730 05/19/19 0430  WBC 6.8 10.9* 11.2* 15.0*  --  52.7*  CREATININE 2.93* 3.19* 2.81* 2.68* 2.75* 3.25*    Estimated Creatinine Clearance: 13.1 mL/min (A) (by C-G formula based on SCr of 3.25 mg/dL (H)).    Allergies  Allergen Reactions  . Sulfa Antibiotics Itching    Antimicrobials this admission: 7/9 Remdesivir >>7/13 7/10 Actemra x1 7/14 Zosyn >>   Dose adjustments this admission:  Microbiology results: 7/9 SARS Coronavirus 2: positive 7/9 UCx: 10k colonies, multiple species 7/13 MRSA PCR: negative  Thank you for allowing pharmacy to be a part of this patient's care.  Gretta Arab PharmD, BCPS Clinical pharmacist phone 7am- 5pm: [phone] 05/19/2019 10:24 AM

## 2019-05-19 NOTE — Progress Notes (Signed)
eLink Physician-Brief Progress Note Patient Name: Betty [name] DOB: [birth year] MRN: [id number]   Date of Service  05/19/2019  HPI/Events of Note  Notified of hypotension, tachycardic on amiodarone. Also oliguric. Na 158, K 3.6, Cl 122  eICU Interventions  Ordered a trial of fluid bolus LR 250 cc. Will also give albumin.     Intervention Category Major Interventions: Hypotension - evaluation and management;Arrhythmia - evaluation and management Intermediate Interventions: Oliguria - evaluation and management  Judd Lien 05/19/2019, 1:54 AM

## 2019-05-19 NOTE — Progress Notes (Signed)
PT Cancellation Note  Patient Details Name: Betty [name] MRN: [id number] DOB: [birth year]   Cancelled Treatment:    Reason Eval/Treat Not Completed: Patient not medically ready. Per RN, intubated with worsening function. PT to sign off, please reorder when medically appropriate to participate with PT evaluation.  Mabeline Caras, PT, DPT Acute Rehabilitation Services  Pager [phone] Office [place] 05/19/2019, 1:56 PM

## 2019-05-19 NOTE — Progress Notes (Signed)
 Nutrition Follow-up  RD working remotely.  DOCUMENTATION CODES:   Not applicable  INTERVENTION:   Tube Feeding:  Vital AF 1.2 at 50 ml/hr Pro-Stat 30 mL daily Provides 1540 kcals, 105 g of protein and 972 mL of free water Meets 100% protein and calorie needs   NUTRITION DIAGNOSIS:   Inadequate oral intake related to acute illness as evidenced by NPO status.  Being addressed via TF   GOAL:   Patient will meet greater than or equal to 90% of their needs  Met  MONITOR:   Vent status, TF tolerance, Labs, Weight trends, Skin  REASON FOR ASSESSMENT:   Consult, Ventilator Enteral/tube feeding initiation and management  ASSESSMENT:   73 yo female admitted with severe acute respiratory failure from COVID-19 pneumonia requiring intubation, AKI/CKD IV.  PMH includes COPD, CKD IV, CHF, dementia, HTN, HLD, non-small cell lung ca in 2016, CAD.  7/13 Extubated; Encephalopathic, unable to protect airway, Re-Intubated  Patient is currently intubated on ventilator support, on levophed, sedated on precedex and fentanyl MV: 14.1 L/min Temp (24hrs), Avg:97.7 F (36.5 C), Min:97 F (36.1 C), Max:99.3 F (37.4 C)  AKI on CKD with plan for CRRT if worsens  Hypernatremic, free water 200 mL q 4 hours Currently on D10 at 40 ml/hr as TF on hold; orders active but TF has not been restarted since intubation  Admission wt 57.2 kg; current wt 57.2 kg  Labs: sodium 150 (H), Creatinine 3.25, BUN 99, potassium wdl, phosphorus wdl Meds: ss novolog, solumedrol  Diet Order:   Diet Order            Diet NPO time specified  Diet effective now              EDUCATION NEEDS:   Not appropriate for education at this time  Skin:  Skin Assessment: Reviewed RN Assessment(no pressure injuries)  Last BM:  7/14  Height:   Ht Readings from Last 1 Encounters:  05/18/19 '5\' 4"'$  (1.626 m)    Weight:   Wt Readings from Last 1 Encounters:  05/19/19 57.4 kg    Ideal Body Weight:  54.5  kg  BMI:  Body mass index is 21.72 kg/m.  Estimated Nutritional Needs:   Kcal:  1491 kcals  Protein:  85-100 g  Fluid:  >/= 1.5 L    Omega Slager MS, RDN, LDN, CNSC [phone] Pager  [phone] Weekend/On-Call Pager

## 2019-05-19 NOTE — Progress Notes (Signed)
NAME:  Betty [name], MRN:  [id number], DOB:  1945-11-30, LOS: 5 ADMISSION DATE:  05/13/2019, CONSULTATION DATE: July 10 REFERRING MD: Cruzita Lederer, CHIEF COMPLAINT: Dyspnea  Brief History   73 year old female with multiple medical problems admitted on July 9 with worsening dyspnea in setting of severe acute respiratory failure with hypoxemia due to COVID-19 pneumonia.  Past Medical History  COPD NSCLC 3546 CKD Systolic heart failure Hypertension  Significant Hospital Events   7/9 admission 7/10 transfer to ICU July 11 attempted to self extubate July 13 extubated, reintubated later in the day due to encephalopathy, developed hypotension, A. fib with RVR July 14, remains hypotensive on vasopressors, increased NG output  Consults:  PCCM  Procedures:  7/10 ETT > July 14 extubated, re-intubated >  7/10 R IJ CVL >   Significant Diagnostic Tests:  04/2019 LVEF 30-35%, LVH, LV hypokinesis, RV normal function, minimal valve insufficiencies  Micro Data:  7/9 SARS COV2 >  7/9 Urine culture >   Antimicrobials:  7/9 remdesivir >  7/9 solumedrol >  7/13  7/14 zosyn >   Interim history/subjective:   This morning had increased NG output, and shock, requiring Levophed, lactic acid elevated at 4  Objective   Blood pressure 133/62, pulse 95, temperature 97.8 F (36.6 C), temperature source Oral, resp. rate (!) 25, height 5\' 4"  (1.626 m), weight 57.4 kg, SpO2 100 %. CVP:  [3 mmHg-12 mmHg] 8 mmHg  Vent Mode: PCV FiO2 (%):  [30 %-100 %] 100 % Set Rate:  [24 bmp] 24 bmp Vt Set:  [430 mL] 430 mL PEEP:  [5 cmH20] 5 cmH20 Plateau Pressure:  [14 cmH20-18 cmH20] 16 cmH20   Intake/Output Summary (Last 24 hours) at 05/19/2019 0756 Last data filed at 05/19/2019 5681 Gross per 24 hour  Intake 2292.56 ml  Output 1425 ml  Net 867.56 ml   Filed Weights   05/17/19 0433 05/18/19 0500 05/19/19 0400  Weight: 59.7 kg 57.7 kg 57.4 kg    Examination:  General:  In bed on vent,  HENT: NCAT ETT in  place, NG in place PULM: CTA B, vent supported breathing CV: RRR, no mgr GI: minimal bowel sounds, soft, nontender MSK: normal bulk and tone Neuro: sedated on vent  July 14 chest x-ray images independently reviewed showing endotracheal tube in place, right IJ catheter in place, pacemaker in place, likely pulmonary edema in bases but no other clear infiltrate   Resolved Hospital Problem list     Assessment & Plan:  Acute respiratory failure with hypoxemia: At this point mostly due to encephalopathy and inability to protect airway  suspect COPD contributing Continue full mechanical ventilatory support Ventilator associated pneumonia prevention protocol Continue Solu-Medrol for total 10 days Continue remdesivir  Continue ipratropium/albuterol Continue sedation with Precedex and fentanyl for ventilator synchrony  Encephalopathy: Multifactorial: July 14 in part due to sepsis and shock, renal failure Need to continue sedation for ventilator synchrony using fentanyl and Precedex targeting RA SS target of 0 to -1 Treat underlying illnesses such as sepsis and renal failure  Shock: Septic, co-ox not consistent with cardiogenic shock Hold BiDil Monitor CVP Volume status exam on July 14: No role for more fluids, already administered several liters of crystalloid overnight Continue norepinephrine titrated to maintain mean arterial pressure greater than 65 Start broad-spectrum antibiotics  COPD Combivent q6h  Congestive heart failure: Venous O2 saturation and physical exam not consistent with cardiogenic shock on 7/14 Hold bidil Tele   CKD> now with non-oliguric failure Nephrology consult, may  need CVVHD in next few days if family agreeable to this Monitor BMET and UOP Replace electrolytes as needed  Goals of care: DNR if arrests, full support otherwise   Best practice:  Diet: tube feeding Pain/Anxiety/Delirium protocol (if indicated): n/a VAP protocol (if indicated): n/a DVT  prophylaxis: sub q heparin GI prophylaxis: Pantoprazole for stress ulcer prophylaxis Glucose control: SSI per TRH Mobility: bed rest, range of motion exercises OK Code Status: DNR  Family Communication: per TRH Disposition:   Labs   CBC: Recent Labs  Lab 05/15/19 0400  05/16/19 0500 05/17/19 0413 05/18/19 0410 05/18/19 2022 05/19/19 0430  WBC 6.8  --  10.9* 11.2* 15.0*  --  PENDING  HGB 12.8   < > 11.2* 11.2* 11.1* 12.9 10.4*  HCT 38.2   < > 33.7* 34.3* 34.9* 38.0 32.8*  MCV 89.0  --  89.4 89.8 90.4  --  94.0  PLT 382  --  370 315 315  --  336   < > = values in this interval not displayed.    Basic Metabolic Panel: Recent Labs  Lab 05/15/19 0400  05/15/19 2103 05/16/19 0500 05/17/19 0413 05/18/19 0410 05/18/19 1730 05/18/19 1845 05/18/19 2022  NA 140   < >  --  143 146* 153* 158*  --  157*  K 4.7   < >  --  5.2* 4.2 4.2 3.6  --  3.7  CL 103  --   --  107 110 118* 122*  --   --   CO2 17*  --   --  23 23 22  21*  --   --   GLUCOSE 133*  --   --  190* 322* 301* 84  --   --   BUN 67*  --   --  99* 70* 103* 96*  --   --   CREATININE 2.93*  --   --  3.19* 2.81* 2.68* 2.75*  --   --   CALCIUM 9.1  --   --  8.8* 8.8* 8.9 9.0  --   --   MG 2.3  --  2.3 2.4  --   --  2.5* 2.4  --   PHOS 6.1*  --  5.4* 5.6* 4.8* 3.9 2.1* 3.2  --    < > = values in this interval not displayed.   GFR: Estimated Creatinine Clearance: 15.5 mL/min (A) (by C-G formula based on SCr of 2.75 mg/dL (H)). Recent Labs  Lab 05/14/19 1310  05/16/19 0500 05/17/19 0413 05/18/19 0410 05/19/19 0430  PROCALCITON 0.59  --   --   --   --   --   WBC  --    < > 10.9* 11.2* 15.0* PENDING   < > = values in this interval not displayed.    Liver Function Tests: Recent Labs  Lab 05/13/19 2354 05/15/19 0400 05/16/19 0500 05/17/19 0413 05/18/19 0410  AST 32 35 32 23 26  ALT 22 20 20 18 18   ALKPHOS 76 65 59 58 52  BILITOT 0.7 0.7 0.3 0.1* 0.4  PROT 7.4 7.9 6.9 6.5 6.3*  ALBUMIN 3.4* 3.3* 3.0* 3.1*  2.9*   No results for input(s): LIPASE, AMYLASE in the last 168 hours. No results for input(s): AMMONIA in the last 168 hours.  ABG    Component Value Date/Time   PHART 7.309 (L) 05/18/2019 2022   PCO2ART 42.5 05/18/2019 2022   PO2ART 62.0 (L) 05/18/2019 2022   HCO3 21.3 05/18/2019 2022   TCO2  23 05/18/2019 2022   ACIDBASEDEF 5.0 (H) 05/18/2019 2022   O2SAT 89.0 05/18/2019 2022     Coagulation Profile: No results for input(s): INR, PROTIME in the last 168 hours.  Cardiac Enzymes: Recent Labs  Lab 05/13/19 2354  CKTOTAL 189    HbA1C: Hgb A1c MFr Bld  Date/Time Value Ref Range Status  05/16/2019 05:00 AM 6.9 (H) 4.8 - 5.6 % Final    Comment:    (NOTE) Pre diabetes:          5.7%-6.4% Diabetes:              >6.4% Glycemic control for   <7.0% adults with diabetes     CBG: Recent Labs  Lab 05/18/19 1206 05/18/19 1551 05/18/19 1942 05/19/19 0017 05/19/19 0415  GLUCAP 176* 66* 138* 102* 93     Critical care time: 45 minutes     Roselie Awkward, MD [place] Pager: [phone] Cell: [phone] If no response, call [phone]

## 2019-05-19 NOTE — Progress Notes (Signed)
Spoke with pt son today. Update proved regarding his moms very sick conidition. Will adjust sedation and run new tests. Emotional support ongoing

## 2019-05-19 NOTE — Progress Notes (Signed)
SLP Cancellation Note  Patient Details Name: Betty [name] MRN: [id number] DOB: 09-16-1946   Cancelled evaluation:    Pt reintubated; our service will sign off.                                                                                                Gurley Climer L. Tivis Ringer, [place] CCC/SLP Acute Rehabilitation Services Office number [phone] Pager [phone]   [name] Laurice 05/19/2019, 10:51 AM

## 2019-05-19 NOTE — Progress Notes (Signed)
OT Cancellation Note  Patient Details Name: Betty [name] MRN: [id number] DOB: December 24, 1945   Cancelled Treatment:    Reason Eval/Treat Not Completed: Other (comment). OT signing off at this time. Please reorder if pt becomes appropriate. Thanks   [place], OT/L   Acute OT Clinical Specialist Acute Rehabilitation Services Pager [phone] Office [phone]  05/19/2019, 4:11 PM

## 2019-05-19 NOTE — Consult Note (Addendum)
[facility] Renal Consultation Note  Requesting MD: [place] Pink Indication for Consultation: A on CRF  HPI:  Betty [name] is a 73 y.o. female with PMhx significant for HTN, tobacco abuse with history of small cell lung CA in 2016 and COPD, CAD, combined CHF and listed dementia but previously living at home alone.  She also has CKD followed as OP by Carolin Sicks- seems like baseline crt in the 2-3 range.  She presented to hospital with SOB, confustion on 7/9- found to be hypoxic and covid positive.  Presenting crt was 3.2 but then improved somewhat into the high 2's.  She seemed to clinically improve, extubated on 7/13 but then deteriorated - required re intubation overnight with hemodynamic instability wit drop off of UOP - BUN and crt 99 and 3.25.  Overall seems to be improving again - pressor dosing is down.  I talked with the nurse who really doesn't feel like she is doing better  Creatinine  Date/Time Value Ref Range Status  08/04/2018 09:16 AM 2.61 (H) 0.44 - 1.00 mg/dL Final  02/04/2018 09:04 AM 2.53 (H) 0.60 - 1.10 mg/dL Final  09/18/2016 10:57 AM 2.8 (H) 0.6 - 1.1 mg/dL Final  03/20/2016 10:58 AM 2.5 (H) 0.6 - 1.1 mg/dL Final  09/28/2015 02:46 PM 3.1 (HH) 0.6 - 1.1 mg/dL Final  09/14/2015 01:33 PM 2.2 (H) 0.6 - 1.1 mg/dL Final  09/07/2015 01:35 PM 2.4 (H) 0.6 - 1.1 mg/dL Final  08/24/2015 01:52 PM 2.4 (H) 0.6 - 1.1 mg/dL Final  08/17/2015 01:47 PM 2.3 (H) 0.6 - 1.1 mg/dL Final  07/27/2015 01:41 PM 2.3 (H) 0.6 - 1.1 mg/dL Final  07/20/2015 01:27 PM 2.6 (H) 0.6 - 1.1 mg/dL Final  07/13/2015 01:41 PM 2.6 (H) 0.6 - 1.1 mg/dL Final  07/06/2015 01:44 PM 2.2 (H) 0.6 - 1.1 mg/dL Final  06/29/2015 02:14 PM 2.2 (H) 0.6 - 1.1 mg/dL Final  06/22/2015 09:31 AM 2.1 (H) 0.6 - 1.1 mg/dL Final   Creatinine, Ser  Date/Time Value Ref Range Status  05/19/2019 04:30 AM 3.25 (H) 0.44 - 1.00 mg/dL Final  05/18/2019 05:30 PM 2.75 (H) 0.44 - 1.00 mg/dL Final  05/18/2019 04:10 AM 2.68 (H) 0.44 -  1.00 mg/dL Final  05/17/2019 04:13 AM 2.81 (H) 0.44 - 1.00 mg/dL Final  05/16/2019 05:00 AM 3.19 (H) 0.44 - 1.00 mg/dL Final  05/15/2019 04:00 AM 2.93 (H) 0.44 - 1.00 mg/dL Final  05/13/2019 11:54 PM 3.27 (H) 0.44 - 1.00 mg/dL Final  06/02/2018 03:05 PM 2.34 (H) 0.57 - 1.00 mg/dL Final  05/20/2018 11:44 AM 3.20 (H) 0.57 - 1.00 mg/dL Final  05/16/2018 01:01 PM 3.18 (H) 0.57 - 1.00 mg/dL Final  04/29/2018 02:19 PM 2.81 (H) 0.57 - 1.00 mg/dL Final  04/26/2018 04:43 AM 2.34 (H) 0.44 - 1.00 mg/dL Final  04/25/2018 07:32 AM 2.12 (H) 0.44 - 1.00 mg/dL Final  04/24/2018 06:09 AM 2.37 (H) 0.44 - 1.00 mg/dL Final  04/23/2018 12:48 AM 2.99 (H) 0.44 - 1.00 mg/dL Final  04/22/2018 06:47 AM 2.69 (H) 0.44 - 1.00 mg/dL Final  04/02/2018 05:52 AM 2.84 (H) 0.44 - 1.00 mg/dL Final  04/01/2018 07:27 AM 2.32 (H) 0.44 - 1.00 mg/dL Final  03/31/2018 03:29 AM 2.37 (H) 0.44 - 1.00 mg/dL Final  03/30/2018 03:54 AM 2.23 (H) 0.44 - 1.00 mg/dL Final  03/29/2018 04:08 AM 2.13 (H) 0.44 - 1.00 mg/dL Final  03/28/2018 05:23 AM 2.50 (H) 0.44 - 1.00 mg/dL Final  03/27/2018 03:38 AM 3.01 (H) 0.44 - 1.00 mg/dL  Final  03/26/2018 05:01 AM 3.08 (H) 0.44 - 1.00 mg/dL Final  03/25/2018 02:16 AM 2.42 (H) 0.44 - 1.00 mg/dL Final  03/24/2018 03:52 PM 2.33 (H) 0.44 - 1.00 mg/dL Final  05/02/2015 06:00 AM 1.76 (H) 0.44 - 1.00 mg/dL Final  05/01/2015 05:58 AM 1.94 (H) 0.44 - 1.00 mg/dL Final  04/30/2015 03:50 AM 1.86 (H) 0.44 - 1.00 mg/dL Final  04/27/2015 08:50 AM 2.16 (H) 0.44 - 1.00 mg/dL Final     PMHx:   Past Medical History:  Diagnosis Date  . Acute combined systolic and diastolic (congestive) hrt fail ([place])   . Acute mastitis of right breast 10/2003  . Arthritis    bursitis in right shoulder (05/21/2018)  . 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 CKD (chronic kidney disease), stage IV ([place])   . Colon polyps   . COPD (chronic obstructive pulmonary disease) ([facility])     emphysema  . Dementia ([place]) 07/16/2018  . Family history of breast cancer   . GERD (gastroesophageal reflux disease)   . Hypercholesterolemia   . Hypertension   . Non-small cell carcinoma of right lung, stage 1 ([place]) 05/17/2015   Stage IB, right upper lobectomy 04/29/15, chemo   . NSTEMI (non-ST elevated myocardial infarction) ([place]) 03/24/2018  . Presence of permanent cardiac pacemaker 05/21/2018  . Pure hypercholesterolemia   . Sciatica of right side   . Seasonal allergic rhinitis   . Tobacco dependence   . Tubular adenoma of colon   . Vitamin D deficiency     Past Surgical History:  Procedure Laterality Date  . BIV PACEMAKER INSERTION CRT-P N/A 05/21/2018   Procedure: BIV PACEMAKER INSERTION CRT-P;  Surgeon: Deboraha Sprang, MD;  Location: [facility];  Service: Cardiovascular;  Laterality: N/A;  . BREAST SURGERY     small mass removed from left breast--benign  . CRYO INTERCOSTAL NERVE BLOCK Right 04/29/2015   Procedure: CRYO INTERCOSTAL NERVE BLOCK;  Surgeon: [name], MD;  Location: [facility];  Service: Thoracic;  Laterality: Right;  . INSERT / REPLACE / REMOVE PACEMAKER  05/21/2018  . LOBECTOMY Right 04/29/2015   Procedure: RIGHT LOWER LUNG LOBECTOMY ;  Surgeon: [name], MD;  Location: [facility];  Service: Thoracic;  Laterality: Right;  . LYMPH NODE DISSECTION Right 04/29/2015   Procedure: RIGHT LUNG LYMPH NODE DISSECTION;  Surgeon: [name], MD;  Location: [facility];  Service: Thoracic;  Laterality: Right;  . RIGHT/LEFT HEART CATH AND CORONARY ANGIOGRAPHY N/A 04/24/2018   Procedure: RIGHT/LEFT HEART CATH AND CORONARY ANGIOGRAPHY;  Surgeon: [name], MD;  Location: [facility];  Service: Cardiovascular;  Laterality: N/A;  . VAGINAL DELIVERY     51 yrs ago  . VIDEO ASSISTED THORACOSCOPY (VATS)/ LOBECTOMY Right 04/29/2015   Procedure: RIGHT VIDEO ASSISTED THORACOSCOPY (VATS) WEDGE RESECTION/ RIGHT LOWER LOBECTOMY, CRYO-ANALGESIA OF  INTERCOSTAL NERVES;  Surgeon: [name], MD;  Location[facility];  Service: Thoracic;  Laterality: Right;    Family Hx:  Family History  Problem Relation Age of Onset  . Cancer Father   . Cancer Sister   . Heart attack Brother   . Dementia Brother   . Other Son        bicuspid aortic valve  . Heart attack Brother   . Cancer Sister     Social History:  reports that she quit smoking about 4 years ago. Her smoking use included cigarettes. She has a 35.25 pack-year smoking  history. She has never used smokeless tobacco. She reports current alcohol use. She reports that she does not use drugs.  Allergies:  Allergies  Allergen Reactions  . Sulfa Antibiotics Itching    Medications: Prior to Admission medications   Medication Sig Start Date End Date Taking? Authorizing Provider  acetaminophen (TYLENOL) 325 MG tablet Take 650 mg by mouth every 6 (six) hours as needed for headache (pain).    Yes [provider]  aspirin EC 81 MG tablet Take 81 mg by mouth at bedtime.   Yes [provider]  atorvastatin (LIPITOR) 40 MG tablet TAKE 1 TABLET BY MOUTH ONCE DAILY AT 6 PM Patient taking differently: Take 40 mg by mouth daily at 6 PM.  03/27/19  Yes Belva Crome, MD  Cholecalciferol (VITAMIN D) 2000 units tablet Take 2,000 Units by mouth at bedtime.   Yes [provider]  donepezil (ARICEPT) 10 MG tablet Take 1 tablet (10 mg total) by mouth at bedtime. 03/23/19  Yes Suzzanne Cloud, NP  febuxostat (ULORIC) 40 MG tablet Take 40 mg by mouth daily.   Yes [provider]  furosemide (LASIX) 40 MG tablet Take 20 mg by mouth daily.    Yes [provider]  isosorbide-hydrALAZINE (BIDIL) 20-37.5 MG tablet Take 1 tablet by mouth 2 (two) times daily. 08/26/18  Yes Deboraha Sprang, MD  metoprolol succinate (TOPROL-XL) 100 MG 24 hr tablet Take 1 tablet (100 mg total) by mouth daily. Pt must keep upcoming appt in August for further refills. Thanks 04/07/19  Yes  Belva Crome, MD  Multiple Vitamin (MULTIVITAMIN WITH MINERALS) TABS tablet Take 0.5 tablets by mouth at bedtime.   Yes [provider]  nitroGLYCERIN (NITROSTAT) 0.4 MG SL tablet Place 1 tablet (0.4 mg total) under the tongue every 5 (five) minutes x 3 doses as needed for chest pain. 04/26/18  Yes Daune Perch, NP  predniSONE (DELTASONE) 10 MG tablet Take 20 mg by mouth daily. 05/07/19  Yes [provider]    I have reviewed the patient's current medications.  Labs:  Results for orders placed or performed during the hospital encounter of 05/13/19 (from the past 48 hour(s))  Glucose, capillary     Status: Abnormal   Collection Time: 05/17/19  3:35 PM  Result Value Ref Range   Glucose-Capillary 106 (H) 70 - 99 mg/dL  Glucose, capillary     Status: Abnormal   Collection Time: 05/17/19  7:51 PM  Result Value Ref Range   Glucose-Capillary 188 (H) 70 - 99 mg/dL  Glucose, capillary     Status: Abnormal   Collection Time: 05/17/19 11:26 PM  Result Value Ref Range   Glucose-Capillary 230 (H) 70 - 99 mg/dL  Glucose, capillary     Status: Abnormal   Collection Time: 05/18/19  3:24 AM  Result Value Ref Range   Glucose-Capillary 295 (H) 70 - 99 mg/dL  CBC     Status: Abnormal   Collection Time: 05/18/19  4:10 AM  Result Value Ref Range   WBC 15.0 (H) 4.0 - 10.5 K/uL   RBC 3.86 (L) 3.87 - 5.11 MIL/uL   Hemoglobin 11.1 (L) 12.0 - 15.0 g/dL   HCT 34.9 (L) 36.0 - 46.0 %   MCV 90.4 80.0 - 100.0 fL   MCH 28.8 26.0 - 34.0 pg   MCHC 31.8 30.0 - 36.0 g/dL   RDW 13.2 11.5 - 15.5 %   Platelets 315 150 - 400 K/uL   nRBC 0.0 0.0 -  0.2 %    Comment: Performed at [facility], [place] [address] [place], [place] [address]  Comprehensive metabolic panel     Status: Abnormal   Collection Time: 05/18/19  4:10 AM  Result Value Ref Range   Sodium 153 (H) 135 - 145 mmol/L   Potassium 4.2 3.5 - 5.1 mmol/L   Chloride 118 (H) 98 - 111 mmol/L   CO2 22 22 - 32 mmol/L    Glucose, Bld 301 (H) 70 - 99 mg/dL   BUN 103 (H) 8 - 23 mg/dL    Comment: RESULTS CONFIRMED BY MANUAL DILUTION   Creatinine, Ser 2.68 (H) 0.44 - 1.00 mg/dL   Calcium 8.9 8.9 - 10.3 mg/dL   Total Protein 6.3 (L) 6.5 - 8.1 g/dL   Albumin 2.9 (L) 3.5 - 5.0 g/dL   AST 26 15 - 41 U/L   ALT 18 0 - 44 U/L   Alkaline Phosphatase 52 38 - 126 U/L   Total Bilirubin 0.4 0.3 - 1.2 mg/dL   GFR calc non Af Amer 17 (L) >60 mL/min   GFR calc Af Amer 20 (L) >60 mL/min   Anion gap 13 5 - 15    Comment: Performed at [facility], [place] [address] [place], [place] [address]  Phosphorus     Status: None   Collection Time: 05/18/19  4:10 AM  Result Value Ref Range   Phosphorus 3.9 2.5 - 4.6 mg/dL    Comment: Performed at [facility], [place] [address], [place], [place] [address]  C-reactive protein     Status: Abnormal   Collection Time: 05/18/19  4:10 AM  Result Value Ref Range   CRP 3.5 (H) <1.0 mg/dL    Comment: Performed at [facility], [place] [address] [place], [place] [address]  D-dimer, quantitative (not at [facility])     Status: Abnormal   Collection Time: 05/18/19  4:10 AM  Result Value Ref Range   D-Dimer, Quant 11.19 (H) 0.00 - 0.50 ug/mL-FEU    Comment: (NOTE) At the manufacturer cut-off of 0.50 ug/mL FEU, this assay has been documented to exclude PE with a sensitivity and negative predictive value of 97 to 99%.  At this time, this assay has not been approved by the FDA to exclude DVT/VTE. Results should be correlated with clinical presentation. Performed at [facility], [place] [address], [place], [place] [address]   Ferritin     Status: Abnormal   Collection Time: 05/18/19  4:10 AM  Result Value Ref Range   Ferritin 347 (H) 11 - 307 ng/mL    Comment: Performed at [facility], [place] [address] [place], [place] [address]  MRSA PCR Screening     Status: None   Collection Time: 05/18/19  5:16 AM    Specimen: Nasal Mucosa; Nasopharyngeal  Result Value Ref Range   MRSA by PCR NEGATIVE NEGATIVE    Comment:        The GeneXpert MRSA Assay (FDA approved for NASAL specimens only), is one component of a comprehensive MRSA colonization surveillance program. It is not intended to diagnose MRSA infection nor to guide or monitor treatment for MRSA infections. Performed at [facility], [place] [address] [place], [place] [address]   Glucose, capillary     Status: Abnormal   Collection Time: 05/18/19  7:36 AM  Result Value Ref Range   Glucose-Capillary 280 (H) 70 - 99 mg/dL  Glucose, capillary  Status: Abnormal   Collection Time: 05/18/19 12:06 PM  Result Value Ref Range   Glucose-Capillary 176 (H) 70 - 99 mg/dL  Glucose, capillary     Status: Abnormal   Collection Time: 05/18/19  3:51 PM  Result Value Ref Range   Glucose-Capillary 66 (L) 70 - 99 mg/dL  Basic metabolic panel     Status: Abnormal   Collection Time: 05/18/19  5:30 PM  Result Value Ref Range   Sodium 158 (H) 135 - 145 mmol/L   Potassium 3.6 3.5 - 5.1 mmol/L   Chloride 122 (H) 98 - 111 mmol/L   CO2 21 (L) 22 - 32 mmol/L   Glucose, Bld 84 70 - 99 mg/dL   BUN 96 (H) 8 - 23 mg/dL    Comment: RESULTS CONFIRMED BY MANUAL DILUTION   Creatinine, Ser 2.75 (H) 0.44 - 1.00 mg/dL   Calcium 9.0 8.9 - 10.3 mg/dL   GFR calc non Af Amer 16 (L) >60 mL/min   GFR calc Af Amer 19 (L) >60 mL/min   Anion gap 15 5 - 15    Comment: Performed at [facility], [place] [address] [place], [place] [address]  Magnesium     Status: Abnormal   Collection Time: 05/18/19  5:30 PM  Result Value Ref Range   Magnesium 2.5 (H) 1.7 - 2.4 mg/dL    Comment: Performed at [facility], [place] [address], [place], [place] [address]  Phosphorus     Status: Abnormal   Collection Time: 05/18/19  5:30 PM  Result Value Ref Range   Phosphorus 2.1 (L) 2.5 - 4.6 mg/dL    Comment: Performed at [facility], [place] [address] [place], [place] [address]  Magnesium     Status: None   Collection Time: 05/18/19  6:45 PM  Result Value Ref Range   Magnesium 2.4 1.7 - 2.4 mg/dL    Comment: Performed at [facility], [place] [address] [place], [place] [address]  Phosphorus     Status: None   Collection Time: 05/18/19  6:45 PM  Result Value Ref Range   Phosphorus 3.2 2.5 - 4.6 mg/dL    Comment: Performed at [facility], [place] [address] [place], [place] [address]  Glucose, capillary     Status: Abnormal   Collection Time: 05/18/19  7:42 PM  Result Value Ref Range   Glucose-Capillary 138 (H) 70 - 99 mg/dL  I-STAT 7, (LYTES, BLD GAS, ICA, H+H)     Status: Abnormal   Collection Time: 05/18/19  8:22 PM  Result Value Ref Range   pH, Arterial 7.309 (L) 7.350 - 7.450   pCO2 arterial 42.5 32.0 - 48.0 mmHg   pO2, Arterial 62.0 (L) 83.0 - 108.0 mmHg   Bicarbonate 21.3 20.0 - 28.0 mmol/L   TCO2 23 22 - 32 mmol/L   O2 Saturation 89.0 %   Acid-base deficit 5.0 (H) 0.0 - 2.0 mmol/L   Sodium 157 (H) 135 - 145 mmol/L   Potassium 3.7 3.5 - 5.1 mmol/L   Calcium, Ion 1.35 1.15 - 1.40 mmol/L   HCT 38.0 36.0 - 46.0 %   Hemoglobin 12.9 12.0 - 15.0 g/dL   Patient temperature 99.3 F    Collection site RADIAL, ALLEN'S TEST ACCEPTABLE    Drawn by RT    Sample type ARTERIAL   Glucose, capillary     Status: Abnormal   Collection Time: 05/19/19 12:17 AM  Result Value Ref Range  Glucose-Capillary 102 (H) 70 - 99 mg/dL  Glucose, capillary     Status: None   Collection Time: 05/19/19  4:15 AM  Result Value Ref Range   Glucose-Capillary 93 70 - 99 mg/dL  CBC     Status: Abnormal   Collection Time: 05/19/19  4:30 AM  Result Value Ref Range   WBC 52.7 (HH) 4.0 - 10.5 K/uL    Comment: This critical result has verified and been called to Lyla Son by Rozelle [place] on 07 14 2020 at [phone], and has been read back. CRITICAL RESULT VERIFIED. RESULT CHECKED.   RBC  3.49 (L) 3.87 - 5.11 MIL/uL   Hemoglobin 10.4 (L) 12.0 - 15.0 g/dL   HCT 32.8 (L) 36.0 - 46.0 %   MCV 94.0 80.0 - 100.0 fL   MCH 29.8 26.0 - 34.0 pg   MCHC 31.7 30.0 - 36.0 g/dL   RDW 13.7 11.5 - 15.5 %   Platelets 336 150 - 400 K/uL   nRBC 0.2 0.0 - 0.2 %    Comment: Performed at [facility], [place] [address], [place], [place] [address]  Comprehensive metabolic panel     Status: Abnormal   Collection Time: 05/19/19  4:30 AM  Result Value Ref Range   Sodium 156 (H) 135 - 145 mmol/L   Potassium 3.9 3.5 - 5.1 mmol/L   Chloride 117 (H) 98 - 111 mmol/L   CO2 18 (L) 22 - 32 mmol/L   Glucose, Bld 27 (LL) 70 - 99 mg/dL    Comment: CRITICAL RESULT CALLED TO, READ BACK BY AND VERIFIED WITH: HICKLING,K @ 0826 ON [id number] BY POTEAT,S    BUN 99 (H) 8 - 23 mg/dL    Comment: RESULTS CONFIRMED BY MANUAL DILUTION   Creatinine, Ser 3.25 (H) 0.44 - 1.00 mg/dL   Calcium 8.9 8.9 - 10.3 mg/dL   Total Protein 5.8 (L) 6.5 - 8.1 g/dL   Albumin 3.3 (L) 3.5 - 5.0 g/dL   AST 34 15 - 41 U/L   ALT 21 0 - 44 U/L   Alkaline Phosphatase 55 38 - 126 U/L   Total Bilirubin 0.2 (L) 0.3 - 1.2 mg/dL   GFR calc non Af Amer 13 (L) >60 mL/min   GFR calc Af Amer 15 (L) >60 mL/min   Anion gap 21 (H) 5 - 15    Comment: Performed at [facility], [place] [address], [place], [place] [address]  Phosphorus     Status: None   Collection Time: 05/19/19  4:30 AM  Result Value Ref Range   Phosphorus 2.8 2.5 - 4.6 mg/dL    Comment: Performed at [facility], [place] [address], [place], [place] [address]  C-reactive protein     Status: Abnormal   Collection Time: 05/19/19  4:30 AM  Result Value Ref Range   CRP 4.3 (H) <1.0 mg/dL    Comment: Performed at [facility], [place] [address], [place], [place] [address]  D-dimer, quantitative (not at [facility])     Status: Abnormal   Collection Time: 05/19/19  4:30 AM  Result Value Ref Range   D-Dimer, Quant 4.90 (H) 0.00  - 0.50 ug/mL-FEU    Comment: (NOTE) At the manufacturer cut-off of 0.50 ug/mL FEU, this assay has been documented to exclude PE with a sensitivity and negative predictive value of 97 to 99%.  At this time, this assay has not been approved by the FDA to exclude DVT/VTE. Results should be  correlated with clinical presentation. Performed at [facility], [place] [address] [place], [place] [address]   Ferritin     Status: None   Collection Time: 05/19/19  4:30 AM  Result Value Ref Range   Ferritin 165 11 - 307 ng/mL    Comment: Performed at [facility], [place] [address], [place], [place] [address]  Magnesium     Status: None   Collection Time: 05/19/19  4:30 AM  Result Value Ref Range   Magnesium 1.9 1.7 - 2.4 mg/dL    Comment: Performed at [facility], [place] [address], [place], [place] [address]  Glucose, capillary     Status: Abnormal   Collection Time: 05/19/19  7:53 AM  Result Value Ref Range   Glucose-Capillary 132 (H) 70 - 99 mg/dL  Lactic acid, plasma     Status: Abnormal   Collection Time: 05/19/19  9:35 AM  Result Value Ref Range   Lactic Acid, Venous 4.0 (HH) 0.5 - 1.9 mmol/L    Comment: CRITICAL RESULT CALLED TO, READ BACK BY AND VERIFIED WITHDonaciano Eva, RN AT 1055 05/19/19 MULLINS,T Performed at [facility], [place] [address], [place],  [address]   POCT I-Stat EG7     Status: Abnormal   Collection Time: 05/19/19 11:01 AM  Result Value Ref Range   pH, Ven 7.425 7.250 - 7.430   pCO2, Ven 28.6 (L) 44.0 - 60.0 mmHg   pO2, Ven 36.0 32.0 - 45.0 mmHg   Bicarbonate 19.0 (L) 20.0 - 28.0 mmol/L   TCO2 20 (L) 22 - 32 mmol/L   O2 Saturation 75.0 %   Acid-base deficit 5.0 (H) 0.0 - 2.0 mmol/L   Sodium 150 (H) 135 - 145 mmol/L   Potassium 4.2 3.5 - 5.1 mmol/L   Calcium, Ion 1.21 1.15 - 1.40 mmol/L   HCT 29.0 (L) 36.0 - 46.0 %   Hemoglobin 9.9 (L) 12.0 - 15.0 g/dL   Patient temperature 96.5 F    Sample  type VENOUS   Glucose, capillary     Status: Abnormal   Collection Time: 05/19/19 12:10 PM  Result Value Ref Range   Glucose-Capillary 147 (H) 70 - 99 mg/dL     ROS:  Review of systems not obtained due to patient factors.  Physical Exam: Vitals:   05/19/19 1059 05/19/19 1128  BP: 94/67 107/83  Pulse: (!) 109 (!) 105  Resp: (!) 30 (!) 24  Temp:    SpO2: 98% 94%     Physical exam not done as patient is on COVID isolation as to preserve the PPE and to limit exposure to other patients and providers.  Place of service is [employer]     Assessment/Plan: 73 year old BF with multiple medical issues including COPD, history of lung CA and CKD.  Now presenting with COVID and complications including vent dependence and recent hemodynamic instability 1.Renal- A on CRF.  Seemed to be kind of stable to improving before events last night.  With that being said she has improved just a little since said events.  I would like to hold off for the next 12-16 hours and see what she can do.  If numbers or she worsens will then plan for CRRT in AM. Give her time to declare herself.   I have discussed with CCM 2. Hypertension/volume  - acute hypotension that seems to be better.  The high sodium indicates free water deficit.  At last check sodium better-  continue free water  3. Elytes- K, phos and Mag all OK  -  not concerning 4. Anemia  - reasonable for now- no action needed  5.  Acid/base- serum bicard 18- pH over 7.3-  No acute need for action    Karmen Altamirano A [place] 05/19/2019, 2:00 PM

## 2019-05-19 NOTE — Progress Notes (Signed)
PROGRESS NOTE  DEARI SESSLER UKG:[id number] DOB: 11/25/1945 DOA: 05/13/2019  PCP: Josetta Huddle, MD  Brief History/Interval Summary: 73 year old female with history of coronary artery disease, non-small cell lung cancer in 2016, hypertension, hyperlipidemia, dementia, COPD, chronic kidney disease stage IV, chronic combined CHF who was brought to the hospital by the patient's son due to increased confusion and weakness.  She was diagnosed with coronavirus in the ED on 7/9.  She was also found to be hypoxic requiring 2 L nasal cannula.  She was admitted to the hospital and placed on steroids along with Remdesivir.  Reason for Visit: Acute respiratory disease due to COVID-19  Consultants: Pulmonology  Procedures:  Intubation 7/10 Central line placement right IJ 7/10  Antibiotics: Anti-infectives (From admission, onward)   Start     Dose/Rate Route Frequency Ordered Stop   05/19/19 1030  piperacillin-tazobactam (ZOSYN) IVPB 2.25 g     2.25 g 100 mL/hr over 30 Minutes Intravenous Every 8 hours 05/19/19 1023     05/15/19 1230  remdesivir 100 mg in sodium chloride 0.9 % 250 mL IVPB     100 mg 500 mL/hr over 30 Minutes Intravenous Every 24 hours 05/14/19 1148 05/18/19 1340   05/14/19 1230  remdesivir 200 mg in sodium chloride 0.9 % 250 mL IVPB     200 mg 500 mL/hr over 30 Minutes Intravenous Once 05/14/19 1148 05/14/19 1551       Subjective/Interval History: Patient had to be reintubated yesterday evening.  Noted to have bloody drainage from the NG tube this morning.  Noted to be hypotensive.  Her WBC has significantly increased.  Renal function has worsened this morning.      Assessment/Plan:  Acute Hypoxic Resp. Failure due to Acute Covid 19 Viral Illness  Vent Mode: PCV FiO2 (%):  [40 %-100 %] 50 % Set Rate:  [24 bmp] 24 bmp Vt Set:  [430 mL] 430 mL PEEP:  [5 cmH20] 5 cmH20 Plateau Pressure:  [14 cmH20-18 cmH20] 17 cmH20     Component Value Date/Time   PHART 7.309 (L)  05/18/2019 2022   PCO2ART 42.5 05/18/2019 2022   PO2ART 62.0 (L) 05/18/2019 2022   HCO3 19.0 (L) 05/19/2019 1101   TCO2 20 (L) 05/19/2019 1101   ACIDBASEDEF 5.0 (H) 05/19/2019 1101   O2SAT 75.0 05/19/2019 1101    COVID-19 Labs  Recent Labs    05/18/19 0410 05/19/19 0430  DDIMER 11.19* 4.90*  FERRITIN 347* 165  CRP 3.5* 4.3*    Lab Results  Component Value Date   SARSCOV2NAA POSITIVE (A) 05/14/2019     Fever: Patient has been afebrile Oxygen requirements: Back on mechanical ventilation.  50% FiO2.  Saturating in the 90s.      Antibiotics: Started on Zosyn today.   Remdesivir: Completed course of Remdesivir Steroids: Solu-Medrol is being tapered down Diuretics: Given Lasix x1 on 7/10.  Not continued due to increase in creatinine Actemra: Actemra x1 on 7/10 Convalescent Plasma: Not given yet Vitamin C and Zinc: Continue DVT Prophylaxis: Heparin will be stopped due to bloody drainage from the NG tube.  SCDs.  Patient had to be reintubated yesterday evening.  Respiratory status remains stable for the most part.  Pulmonology continues to follow.  Her CRP is 4.3 today.  D-dimer 4.9.  Ferritin 165.  Steroid is being tapered down due to encephalopathy.  She was also given 1 dose of Actemra.  Significant leukocytosis Significant increase in WBC noted today.  She is afebrile.  Abdomen is benign.  Chest x-ray and abdominal films were repeated which did not show any concerning findings in the abdomen.  Chest x-ray did suggest a pleural effusions.  Reason for elevated WBC not clear.  Patient started on Zosyn.  We will check procalcitonin levels tomorrow.  Trend WBC.  Blood cultures will be ordered.  Lactic acid level was noted to be elevated at 4.0.  Shock Likely a combination of septic as well as cardiogenic shock.  She does have cool extremities.  Venous blood gas shows saturation of 75.  This makes the likelihood of cardiogenic shock low.  Continue pressor support.  Atrial  fibrillation with RVR Patient went into rapid atrial fibrillation yesterday when she was in respiratory distress.  She was placed on amiodarone infusion.  Continue to monitor.  Possible upper GI bleed Bloody secretions noted from NG tube.  Stop her subcutaneous heparin.  Give PPI intravenously.  Monitor hemoglobin.  Hypernatremia Free water was prescribed yesterday.  However however after she was extubated this is could not be given as the OG tube was also removed.  Patient was started on D5.  Had to be reintubated yesterday.  Now she has bloody drainage from her NG tube.  We will continue to watch.  She is currently on a D10 infusion.  Hopefully sodium will continue to correct.    Chronic systolic CHF Most recent echocardiogram done in June 2020 showed a EF of 30 to 35%.  Chest x-ray did raise concern for pulmonary edema.  However patient is currently hypotensive.  Acute on chronic kidney disease stage IV Baseline creatinine around 2.0-3.0.  Patient's renal function has worsened.  Her urine output has dropped off.  This was discussed with patient's son.  He is amenable to dialysis on CRRT if needed.  Patient does have poor overall prognosis.  Discussed with pulmonology as well as with nephrology.  Will give trial of CRRT.    Hyperglycemia due to steroids Elevated CBGs are due to steroids.  She has been on multiple courses of prednisone in the last 1 to 2 months for acute gout which is the likely reason for her hyperglycemia.  And also likely reason for her elevated HbA1c.  Continue long-acting insulin and SSI for now.  CBG should improve as steroid is tapered down.     History of dementia/acute metabolic encephalopathy Appears to be mild dementia.  Patient is able to live independently.  Apparently has poor short-term memory.  Continue to monitor.  Patient has encephalopathy most likely due to acute illness and medications.  Continue to monitor.  No obvious deficits noted.  History of non-small  cell lung cancer and COPD She is status post right lower lobe lobectomy and has completed 4 cycles of adjuvant chemotherapy.  Lung cancer appears to be in remission.    History of gout Initially it was thought that patient is on chronic prednisone at home but review of previous notes did not mention prednisone.  Discussed on steroids with patient's son.  Apparently over the last 1 to 2 months she has had issues with acute gout for which she was prescribed courses of steroids.  She does not take steroid on a regular basis.  However she was on a course of prednisone at the time of admission.  Nutrition Speech therapy to evaluate tube feedings on hold currently  Goals of care Patient appears to have taken a turn for the worse.  This was discussed in detail with patient's son.  He is agreeable to DNR currently.  He does want to pursue CRRT and dialysis if that is an option.  He understands that patient is getting worse.  He understands that she may not survive this hospitalization.  DVT Prophylaxis: Heparin changed over to SCDs PUD Prophylaxis: Protonix Code Status: DNR Family Communication: Discussed with son on daily basis. Disposition Plan: Will remain in ICU   Medications:  Scheduled: . aspirin EC  81 mg Oral QHS  . chlorhexidine  15 mL Mouth/Throat BID  . Chlorhexidine Gluconate Cloth  6 each Topical Daily  . clonazepam  1 mg Oral BID  . docusate  100 mg Oral BID  . donepezil  10 mg Oral QHS  . febuxostat  40 mg Oral Daily  . feeding supplement (PRO-STAT SUGAR FREE 64)  30 mL Per Tube BID  . free water  200 mL Per Tube Q4H  . insulin aspart  0-20 Units Subcutaneous Q4H  . insulin aspart  6 Units Subcutaneous Q4H  . insulin detemir  15 Units Subcutaneous BID  . ipratropium-albuterol  3 mL Nebulization Q6H  . methylPREDNISolone (SOLU-MEDROL) injection  20 mg Intravenous Q24H  . pantoprazole (PROTONIX) IV  40 mg Intravenous Q12H  . QUEtiapine  50 mg Oral BID  . sodium chloride  flush  3 mL Intravenous Q12H   Continuous: . amiodarone 30 mg/hr (05/19/19 0900)  . dexmedetomidine (PRECEDEX) IV infusion 0.9 mcg/kg/hr (05/19/19 0900)  . dextrose 40 mL/hr at 05/19/19 0900  . feeding supplement (VITAL AF 1.2 CAL) Stopped (05/19/19 0601)  . fentaNYL infusion INTRAVENOUS 100 mcg/hr (05/19/19 1120)  . norepinephrine (LEVOPHED) Adult infusion 18 mcg/min (05/19/19 0900)  . piperacillin-tazobactam (ZOSYN)  IV 2.25 g (05/19/19 1257)   BMW:[id number], bisacodyl, fentaNYL, hydrALAZINE, midazolam, midazolam, ondansetron **OR** ondansetron (ZOFRAN) IV, oxyCODONE, polyethylene glycol   Objective:  Vital Signs  Vitals:   05/19/19 0909 05/19/19 0940 05/19/19 1059 05/19/19 1128  BP: (!) 87/55 (!) 81/45 94/67 107/83  Pulse: (!) 107 (!) 108 (!) 109 (!) 105  Resp: (!) 39 (!) 29 (!) 30 (!) 24  Temp:      TempSrc:      SpO2: 96% 97% 98% 94%  Weight:      Height:        Intake/Output Summary (Last 24 hours) at 05/19/2019 1319 Last data filed at 05/19/2019 0900 Gross per 24 hour  Intake 2430.58 ml  Output 1245 ml  Net 1185.58 ml   Filed Weights   05/17/19 0433 05/18/19 0500 05/19/19 0400  Weight: 59.7 kg 57.7 kg 57.4 kg    General appearance: Intubated and sedated Resp: Coarse breath sounds bilaterally with crackles at the bases Cardio: S1-S2 is normal regular.  No S3-S4.  No rubs murmurs or bruit GI: Abdomen soft.  Nontender nondistended.  Bowel sounds are present.  No masses organomegaly.  Brown stool noted.   Extremities: No edema.   Neurologic: Sedated    Lab Results:  Data Reviewed: I have personally reviewed following labs and imaging studies  CBC: Recent Labs  Lab 05/15/19 0400  05/16/19 0500 05/17/19 0413 05/18/19 0410 05/18/19 2022 05/19/19 0430 05/19/19 1101  WBC 6.8  --  10.9* 11.2* 15.0*  --  52.7*  --   HGB 12.8   < > 11.2* 11.2* 11.1* 12.9 10.4* 9.9*  HCT 38.2   < > 33.7* 34.3* 34.9* 38.0 32.8* 29.0*  MCV 89.0  --  89.4 89.8 90.4  --   94.0  --   PLT 382  --  370 315 315  --  336  --    < > = values in this interval not displayed.    Basic Metabolic Panel: Recent Labs  Lab 05/15/19 2103 05/16/19 0500 05/17/19 0413 05/18/19 0410 05/18/19 1730 05/18/19 1845 05/18/19 2022 05/19/19 0430 05/19/19 1101  NA  --  143 146* 153* 158*  --  157* 156* 150*  K  --  5.2* 4.2 4.2 3.6  --  3.7 3.9 4.2  CL  --  107 110 118* 122*  --   --  117*  --   CO2  --  23 23 22  21*  --   --  18*  --   GLUCOSE  --  190* 322* 301* 84  --   --  27*  --   BUN  --  99* 70* 103* 96*  --   --  99*  --   CREATININE  --  3.19* 2.81* 2.68* 2.75*  --   --  3.25*  --   CALCIUM  --  8.8* 8.8* 8.9 9.0  --   --  8.9  --   MG 2.3 2.4  --   --  2.5* 2.4  --  1.9  --   PHOS 5.4* 5.6* 4.8* 3.9 2.1* 3.2  --  2.8  --     GFR: Estimated Creatinine Clearance: 13.1 mL/min (A) (by C-G formula based on SCr of 3.25 mg/dL (H)).  Liver Function Tests: Recent Labs  Lab 05/15/19 0400 05/16/19 0500 05/17/19 0413 05/18/19 0410 05/19/19 0430  AST 35 32 23 26 34  ALT 20 20 18 18 21   ALKPHOS 65 59 58 52 55  BILITOT 0.7 0.3 0.1* 0.4 0.2*  PROT 7.9 6.9 6.5 6.3* 5.8*  ALBUMIN 3.3* 3.0* 3.1* 2.9* 3.3*    Cardiac Enzymes: Recent Labs  Lab 05/13/19 2354  CKTOTAL 189    CBG: Recent Labs  Lab 05/18/19 1942 05/19/19 0017 05/19/19 0415 05/19/19 0753 05/19/19 1210  GLUCAP 138* 102* 93 132* 147*    Anemia Panel: Recent Labs    05/18/19 0410 05/19/19 0430  FERRITIN 347* 165    Recent Results (from the past 240 hour(s))  SARS Coronavirus 2 (CEPHEID - Performed in [facility] hospital lab), Hosp Order     Status: Abnormal   Collection Time: 05/14/19  5:20 AM   Specimen: Nasopharyngeal Swab  Result Value Ref Range Status   SARS Coronavirus 2 POSITIVE (A) NEGATIVE Final    Comment: RESULT CALLED TO, READ BACK BY AND VERIFIED WITH: RN C CHRISCO @0630  05/14/19 BY S GEZAHEGN (NOTE) If result is NEGATIVE SARS-CoV-2 target nucleic acids are NOT  DETECTED. The SARS-CoV-2 RNA is generally detectable in upper and lower  respiratory specimens during the acute phase of infection. The lowest  concentration of SARS-CoV-2 viral copies this assay can detect is 250  copies / mL. A negative result does not preclude SARS-CoV-2 infection  and should not be used as the sole basis for treatment or other  patient management decisions.  A negative result may occur with  improper specimen collection / handling, submission of specimen other  than nasopharyngeal swab, presence of viral mutation(s) within the  areas targeted by this assay, and inadequate number of viral copies  (<250 copies / mL). A negative result must be combined with clinical  observations, patient history, and epidemiological information. If result is POSITIVE SARS-CoV-2 target nucleic acids are DETECTED . The SARS-CoV-2 RNA is generally detectable in upper and lower  respiratory specimens during the acute phase  of infection.  Positive  results are indicative of active infection with SARS-CoV-2.  Clinical  correlation with patient history and other diagnostic information is  necessary to determine patient infection status.  Positive results do  not rule out bacterial infection or co-infection with other viruses. If result is PRESUMPTIVE POSTIVE SARS-CoV-2 nucleic acids MAY BE PRESENT.   A presumptive positive result was obtained on the submitted specimen  and confirmed on repeat testing.  While 2019 novel coronavirus  (SARS-CoV-2) nucleic acids may be present in the submitted sample  additional confirmatory testing may be necessary for epidemiological  and / or clinical management purposes  to differentiate between  SARS-CoV-2 and other Sarbecovirus currently known to infect humans.  If clinically indicated additional testing with an alternate test  methodology [phone] ) is advised. The SARS-CoV-2 RNA is generally  detectable in upper and lower respiratory specimens during  the acute  phase of infection. The expected result is Negative. Fact Sheet for Patients:  StrictlyIdeas.no Fact Sheet for Healthcare Providers: BankingDealers.co.za This test is not yet approved or cleared by the [place] FDA and has been authorized for detection and/or diagnosis of SARS-CoV-2 by FDA under an Emergency Use Authorization (EUA).  This EUA will remain in effect (meaning this test can be used) for the duration of the COVID-19 declaration under Section 564(b)(1) of the Act, 21 U.S.C. section 360bbb-3(b)(1), unless the authorization is terminated or revoked sooner. Performed at [facility], [place] [address] [place], [place] [address]   Urine culture     Status: Abnormal   Collection Time: 05/14/19 11:30 AM   Specimen: Urine, Clean Catch  Result Value Ref Range Status   Specimen Description   Final    URINE, CLEAN CATCH Performed at [facility], [place] [address] [place], [place] [address]    Special Requests   Final    NONE Performed at [facility], [place] [address] [place], [place] [address]    Culture (A)  Final    10,000 COLONIES/mL MULTIPLE SPECIES PRESENT, SUGGEST RECOLLECTION   Report Status 05/16/2019 FINAL  Final  MRSA PCR Screening     Status: None   Collection Time: 05/18/19  5:16 AM   Specimen: Nasal Mucosa; Nasopharyngeal  Result Value Ref Range Status   MRSA by PCR NEGATIVE NEGATIVE Final    Comment:        The GeneXpert MRSA Assay (FDA approved for NASAL specimens only), is one component of a comprehensive MRSA colonization surveillance program. It is not intended to diagnose MRSA infection nor to guide or monitor treatment for MRSA infections. Performed at [facility], [place] [address] [place], [place] [address]       Radiology Studies: Dg Abd 1 View  Result Date: 05/18/2019 CLINICAL DATA:  NG tube placement EXAM: ABDOMEN - 1  VIEW COMPARISON:  May 15, 2019 FINDINGS: NG tube tip is in the right upper quadrant, likely in the distal stomach or proximal duodenum. Nonobstructive bowel gas pattern. IMPRESSION: NG tube tip in the Peri pyloric region. Electronically Signed   By: Rolm Baptise M.D.   On: 05/18/2019 19:25   Dg Chest Port 1 View  Result Date: 05/19/2019 CLINICAL DATA:  Hx of intubation and NG placement EXAM: PORTABLE CHEST 1 VIEW COMPARISON:  Chest x-rays dated 05/18/2019 and 05/16/2019. FINDINGS: Endotracheal tube is well positioned with tip just above the level of the carina. RIGHT IJ central line appears adequately positioned within the SVC. Enteric tube  passes below the diaphragm. Patchy predominantly interstitial bibasilar opacities are stable, likely a combination of interstitial edema and small pleural effusions. No pneumothorax seen. IMPRESSION: 1. Endotracheal tube is well positioned with tip just above the level of the carina. 2. Enteric tube passes below the diaphragm. 3. Stable bibasilar opacities, predominantly interstitial, most likely a combination of interstitial edema and small layering pleural effusions, superimposed pneumonia not excluded if febrile. Electronically Signed   By: Franki Cabot M.D.   On: 05/19/2019 10:01   Portable Chest X-ray  Result Date: 05/18/2019 CLINICAL DATA:  73 year old female COVID-19. Intubated. EXAM: PORTABLE CHEST 1 VIEW COMPARISON:  05/16/2019 and earlier. FINDINGS: Portable AP supine view at 1741 hours. Endotracheal tube tip between the level the clavicles and carina. Enteric tube courses to the left upper quadrant, tip not included. Stable right IJ central line. Stable left chest pacemaker. Stable cardiac size and mediastinal contours. Increased veiling opacity at both lung bases since 05/16/2019. Obscuration of the diaphragm and dense retrocardiac opacity. No pneumothorax or pulmonary edema. Paucity of bowel gas. No acute osseous abnormality identified. IMPRESSION: 1. ET  tube in good position. Enteric tube courses to the abdomen, tip not included. 2. Increased veiling opacity at both lung bases since 05/16/2019 may indicate increased bilateral pleural effusions with lower lobe collapse or consolidation. Electronically Signed   By: Genevie Ann M.D.   On: 05/18/2019 19:23   Dg Abd Portable 1v  Result Date: 05/19/2019 CLINICAL DATA:  Acute respiratory failure with hypoxemia. EXAM: PORTABLE ABDOMEN - 1 VIEW COMPARISON:  Plain film of the abdomen dated 05/18/2019. FINDINGS: Enteric tube appears adequately positioned in the stomach. Visualized bowel gas pattern is nonobstructive. No evidence of free intraperitoneal air. IMPRESSION: Enteric tube appears adequately positioned in the stomach. Nonobstructive bowel gas pattern. Electronically Signed   By: Franki Cabot M.D.   On: 05/19/2019 10:02       LOS: 5 days   [facility] Pager on [email or web address]  05/19/2019, 1:19 PM

## 2019-05-19 NOTE — Progress Notes (Signed)
Inpatient Diabetes Program Recommendations  AACE/ADA: New Consensus Statement on Inpatient Glycemic Control (2015)  Target Ranges:  Prepandial:   less than 140 mg/dL      Peak postprandial:   less than 180 mg/dL (1-2 hours)      Critically ill patients:  140 - 180 mg/dL   Lab Results  Component Value Date   GLUCAP 132 (H) 05/19/2019   HGBA1C 6.9 (H) 05/16/2019    Review of Glycemic Control Results for LAWANNA, CECERE (MRN [id number]) as of 05/19/2019 08:53  Ref. Range 05/18/2019 15:51 05/18/2019 19:42 05/19/2019 00:17 05/19/2019 04:15 05/19/2019 07:53  Glucose-Capillary Latest Ref Range: 70 - 99 mg/dL 66 (L) 138 (H) 102 (H) 93 132 (H)   Inpatient Diabetes Program Recommendations:    Noted steroids restarting Solumedrol 20 mg q 24 hrs. Consider  -Decrease in Novolog correction to moderate q 4 hrs.  Thank you, Nani Gasser. Deyona Soza, RN, MSN, CDE  Diabetes Coordinator Inpatient Glycemic Control Team Team Pager [phone] (8am-5pm) 05/19/2019 8:58 AM

## 2019-05-20 LAB — GLUCOSE, CAPILLARY
Glucose-Capillary: 157 mg/dL — ABNORMAL HIGH (ref 70–99)
Glucose-Capillary: 163 mg/dL — ABNORMAL HIGH (ref 70–99)
Glucose-Capillary: 176 mg/dL — ABNORMAL HIGH (ref 70–99)
Glucose-Capillary: 205 mg/dL — ABNORMAL HIGH (ref 70–99)
Glucose-Capillary: 211 mg/dL — ABNORMAL HIGH (ref 70–99)
Glucose-Capillary: 220 mg/dL — ABNORMAL HIGH (ref 70–99)
Glucose-Capillary: 242 mg/dL — ABNORMAL HIGH (ref 70–99)

## 2019-05-20 LAB — POCT I-STAT 7, (LYTES, BLD GAS, ICA,H+H)
Acid-base deficit: 5 mmol/L — ABNORMAL HIGH (ref 0.0–2.0)
Bicarbonate: 19.5 mmol/L — ABNORMAL LOW (ref 20.0–28.0)
Calcium, Ion: 1.18 mmol/L (ref 1.15–1.40)
HCT: 29 % — ABNORMAL LOW (ref 36.0–46.0)
Hemoglobin: 9.9 g/dL — ABNORMAL LOW (ref 12.0–15.0)
O2 Saturation: 93 %
Patient temperature: 97.3
Potassium: 4.1 mmol/L (ref 3.5–5.1)
Sodium: 153 mmol/L — ABNORMAL HIGH (ref 135–145)
TCO2: 20 mmol/L — ABNORMAL LOW (ref 22–32)
pCO2 arterial: 30.6 mmHg — ABNORMAL LOW (ref 32.0–48.0)
pH, Arterial: 7.409 (ref 7.350–7.450)
pO2, Arterial: 63 mmHg — ABNORMAL LOW (ref 83.0–108.0)

## 2019-05-20 LAB — COMPREHENSIVE METABOLIC PANEL
ALT: 19 U/L (ref 0–44)
AST: 33 U/L (ref 15–41)
Albumin: 2.7 g/dL — ABNORMAL LOW (ref 3.5–5.0)
Alkaline Phosphatase: 72 U/L (ref 38–126)
Anion gap: 11 (ref 5–15)
BUN: 93 mg/dL — ABNORMAL HIGH (ref 8–23)
CO2: 23 mmol/L (ref 22–32)
Calcium: 8 mg/dL — ABNORMAL LOW (ref 8.9–10.3)
Chloride: 119 mmol/L — ABNORMAL HIGH (ref 98–111)
Creatinine, Ser: 3.18 mg/dL — ABNORMAL HIGH (ref 0.44–1.00)
GFR calc Af Amer: 16 mL/min — ABNORMAL LOW (ref >60)
GFR calc non Af Amer: 14 mL/min — ABNORMAL LOW (ref >60)
Glucose, Bld: 203 mg/dL — ABNORMAL HIGH (ref 70–99)
Potassium: 4.2 mmol/L (ref 3.5–5.1)
Sodium: 153 mmol/L — ABNORMAL HIGH (ref 135–145)
Total Bilirubin: 0.4 mg/dL (ref 0.3–1.2)
Total Protein: 5 g/dL — ABNORMAL LOW (ref 6.5–8.1)

## 2019-05-20 LAB — CBC
HCT: 31.1 % — ABNORMAL LOW (ref 36.0–46.0)
Hemoglobin: 10.2 g/dL — ABNORMAL LOW (ref 12.0–15.0)
MCH: 30.1 pg (ref 26.0–34.0)
MCHC: 32.8 g/dL (ref 30.0–36.0)
MCV: 91.7 fL (ref 80.0–100.0)
Platelets: 275 10*3/uL (ref 150–400)
RBC: 3.39 MIL/uL — ABNORMAL LOW (ref 3.87–5.11)
RDW: 13.7 % (ref 11.5–15.5)
WBC: 24 10*3/uL — ABNORMAL HIGH (ref 4.0–10.5)
nRBC: 0.2 % (ref 0.0–0.2)

## 2019-05-20 LAB — D-DIMER, QUANTITATIVE: D-Dimer, Quant: 1.52 ug/mL-FEU — ABNORMAL HIGH (ref 0.00–0.50)

## 2019-05-20 LAB — FERRITIN: Ferritin: 215 ng/mL (ref 11–307)

## 2019-05-20 LAB — PHOSPHORUS: Phosphorus: 5.3 mg/dL — ABNORMAL HIGH (ref 2.5–4.6)

## 2019-05-20 LAB — MAGNESIUM: Magnesium: 2.1 mg/dL (ref 1.7–2.4)

## 2019-05-20 LAB — C-REACTIVE PROTEIN: CRP: 14.4 mg/dL — ABNORMAL HIGH (ref <1.0)

## 2019-05-20 MED ORDER — SODIUM CHLORIDE 0.9 % IV SOLN
INTRAVENOUS | Status: DC | PRN
  Administered 2019-05-20 – 2019-06-08 (×5): 250 mL via INTRAVENOUS

## 2019-05-20 MED ORDER — PHENYLEPHRINE HCL-NACL 10-0.9 MG/250ML-% IV SOLN
0.0000 ug/min | INTRAVENOUS | Status: DC
  Administered 2019-05-20: 20 ug/min via INTRAVENOUS
  Administered 2019-05-20: 50 ug/min via INTRAVENOUS
  Administered 2019-05-21 (×2): 65 ug/min via INTRAVENOUS
  Filled 2019-05-20 (×5): qty 250

## 2019-05-20 MED ORDER — ORAL CARE MOUTH RINSE
15.0000 mL | OROMUCOSAL | Status: DC
  Administered 2019-05-20 – 2019-05-26 (×58): 15 mL via OROMUCOSAL

## 2019-05-20 MED ORDER — FREE WATER
400.0000 mL | Status: DC
  Administered 2019-05-20 – 2019-05-21 (×5): 400 mL

## 2019-05-20 NOTE — Progress Notes (Signed)
PROGRESS NOTE  Betty [name] PJK:[id number] DOB: October 15, 1946 DOA: 05/13/2019  PCP: Josetta Huddle, MD  Brief History/Interval Summary:  - 73 year old female with history of coronary artery disease, non-small cell lung cancer in 2016, hypertension, hyperlipidemia, dementia, COPD, chronic kidney disease stage IV, chronic combined CHF who was brought to the hospital by the patient's son due to increased confusion and weakness.  She was diagnosed with coronavirus in the ED on 7/9.  She was also found to be hypoxic requiring 2 L nasal cannula.  She was admitted to the hospital and placed on steroids along with Remdesivir.  Subjective/Interval History:  No significant events overnight as discussed with staff .  Assessment/Plan:  Acute Hypoxic Resp. Failure due to Acute Covid 19 Viral Illness  Vent Mode: PCV FiO2 (%):  [40 %-50 %] 40 % Set Rate:  [24 bmp] 24 bmp PEEP:  [5 cmH20] 5 cmH20 Plateau Pressure:  [11 cmH20-18 cmH20] 12 cmH20     Component Value Date/Time   PHART 7.409 05/20/2019 0546   PCO2ART 30.6 (L) 05/20/2019 0546   PO2ART 63.0 (L) 05/20/2019 0546   HCO3 19.5 (L) 05/20/2019 0546   TCO2 20 (L) 05/20/2019 0546   ACIDBASEDEF 5.0 (H) 05/20/2019 0546   O2SAT 93.0 05/20/2019 0546    COVID-19 Labs  Recent Labs    05/18/19 0410 05/19/19 0430 05/20/19 0115  DDIMER 11.19* 4.90* 1.52*  FERRITIN 347* 165 215  CRP 3.5* 4.3* 14.4*    Lab Results  Component Value Date   SARSCOV2NAA POSITIVE (A) 05/14/2019     Fever: Patient has been afebrile Oxygen requirements: Back on mechanical ventilation.  50% FiO2.  Saturating in the 90s.      Antibiotics: Started on Zosyn 7/14.   Remdesivir: Completed course of Remdesivir Steroids: Solu-Medrol  Diuretics: Given Lasix x1 on 7/10.  Not continued due to increase in creatinine Actemra: Actemra x1 on 7/10 Convalescent Plasma: Not given yet Vitamin C and Zinc: Continue DVT Prophylaxis: Heparin will be stopped due to bloody drainage  from the NG tube.  SCDs.  Patient had to be reintubated 7/13 evening.  Respiratory status remains stable for the most part.  Pulmonology continues to follow. Continue to follow inflammatory markers.  Steroid is being tapered down due to encephalopathy.  She was also given 1 dose of Actemra.  Significant leukocytosis Patient with significant leukocytosis, it is trending down, she remains afebrile, abdomen is benign  Chest x-ray and abdominal films were repeated which did not show any concerning findings in the abdomen.  Chest x-ray did suggest a pleural effusions.  Reason for elevated WBC not clear.  Patient started on Zosyn.  We will check procalcitonin levels tomorrow.  Trend WBC.  Blood cultures will be ordered.  Lactic acid level was noted to be elevated at 4.0.  Shock Likely a combination of septic as well as cardiogenic shock.  She does have cool extremities.  Venous blood gas shows saturation of 75.  This makes the likelihood of cardiogenic shock low.  Continue pressor support.  Atrial fibrillation with RVR Patient went into rapid atrial fibrillation yesterday when she was in respiratory distress.  She was placed on amiodarone infusion.  Continue to monitor.  Possible upper GI bleed Bloody secretions noted from NG tube.  Stop her subcutaneous heparin.  Give PPI intravenously.  Monitor hemoglobin.  Hypernatremia Will start on free water via OGT   Chronic systolic CHF Most recent echocardiogram done in June 2020 showed a EF of 30 to 35%.  Chest x-ray did raise concern for pulmonary edema.  However patient is currently hypotensive.  Acute on chronic kidney disease stage IV Baseline creatinine around 2.0-3.0.  Patient with worsening renal function, currently has stabilized, remains with good urine output, no indication for CRRT per renal .  Hyperglycemia due to steroids Elevated CBGs are due to steroids.  She has been on multiple courses of prednisone in the last 1 to 2 months for acute  gout which is the likely reason for her hyperglycemia.  And also likely reason for her elevated HbA1c.  Continue long-acting insulin and SSI for now.  CBG should improve as steroid is tapered down.     History of dementia/acute metabolic encephalopathy Appears to be mild dementia.  Patient is able to live independently.  Apparently has poor short-term memory.  Continue to monitor.  Patient has encephalopathy most likely due to acute illness and medications.  Continue to monitor.  No obvious deficits noted.  History of non-small cell lung cancer and COPD She is status post right lower lobe lobectomy and has completed 4 cycles of adjuvant chemotherapy.  Lung cancer appears to be in remission.    History of gout Initially it was thought that patient is on chronic prednisone at home but review of previous notes did not mention prednisone.  Discussed on steroids with patient's son.  Apparently over the last 1 to 2 months she has had issues with acute gout for which she was prescribed courses of steroids.  She does not take steroid on a regular basis.  However she was on a course of prednisone at the time of admission.  Nutrition Speech therapy to evaluate tube feedings on hold currently  Goals of care Patient appears to have taken a turn for the worse. Previous MD discussed in detail with patient's son.  He is agreeable to DNR currently.  He does want to pursue CRRT and dialysis if that is an option.  He understands that patient is getting worse.  He understands that she may not survive this hospitalization.  DVT Prophylaxis: Heparin changed over to SCDs PUD Prophylaxis: Protonix Code Status: DNR Family Communication:will discuss with son on daily basis. Disposition Plan: Will remain in ICU  Reason for Visit: Acute respiratory disease due to COVID-19  Consultants: Pulmonology  Procedures:  Intubation 7/10 Central line placement right IJ 7/10  Antibiotics: Anti-infectives (From admission,  onward)   Start     Dose/Rate Route Frequency Ordered Stop   05/19/19 1030  piperacillin-tazobactam (ZOSYN) IVPB 2.25 g     2.25 g 100 mL/hr over 30 Minutes Intravenous Every 8 hours 05/19/19 1023     05/15/19 1230  remdesivir 100 mg in sodium chloride 0.9 % 250 mL IVPB     100 mg 500 mL/hr over 30 Minutes Intravenous Every 24 hours 05/14/19 1148 05/18/19 1340   05/14/19 1230  remdesivir 200 mg in sodium chloride 0.9 % 250 mL IVPB     200 mg 500 mL/hr over 30 Minutes Intravenous Once 05/14/19 1148 05/14/19 1551        Medications:  Scheduled: . chlorhexidine  15 mL Mouth/Throat BID  . Chlorhexidine Gluconate Cloth  6 each Topical Daily  . clonazepam  1 mg Oral BID  . docusate  100 mg Oral BID  . donepezil  10 mg Oral QHS  . feeding supplement (PRO-STAT SUGAR FREE 64)  30 mL Per Tube BID  . free water  400 mL Per Tube Q4H  . insulin aspart  0-20  Units Subcutaneous Q4H  . insulin aspart  6 Units Subcutaneous Q4H  . insulin detemir  15 Units Subcutaneous BID  . ipratropium-albuterol  3 mL Nebulization Q6H  . mouth rinse  15 mL Mouth Rinse 10 times per day  . methylPREDNISolone (SOLU-MEDROL) injection  20 mg Intravenous Q24H  . pantoprazole (PROTONIX) IV  40 mg Intravenous Q12H  . QUEtiapine  50 mg Oral BID  . sodium chloride flush  3 mL Intravenous Q12H   Continuous: . sodium chloride 250 mL (05/20/19 1446)  . amiodarone 30 mg/hr (05/20/19 1400)  . dexmedetomidine (PRECEDEX) IV infusion 1 mcg/kg/hr (05/20/19 1400)  . dextrose 40 mL/hr at 05/20/19 1400  . feeding supplement (VITAL AF 1.2 CAL) 1,000 mL (05/20/19 1311)  . fentaNYL infusion INTRAVENOUS Stopped (05/20/19 1007)  . norepinephrine (LEVOPHED) Adult infusion 18 mcg/min (05/20/19 1400)  . piperacillin-tazobactam (ZOSYN)  IV 2.25 g (05/20/19 1448)   IRC:VELFYB chloride, acetaminophen, bisacodyl, fentaNYL, midazolam, midazolam, ondansetron **OR** ondansetron (ZOFRAN) IV, oxyCODONE, polyethylene glycol   Objective:   Vital Signs  Vitals:   05/20/19 1430 05/20/19 1445 05/20/19 1500 05/20/19 1515  BP: 108/63 107/61 97/63 (!) 101/59  Pulse: (!) 106 (!) 107 (!) 111 (!) 115  Resp: (!) 23 (!) 22 (!) 26 (!) 24  Temp:      TempSrc:      SpO2: 98% 97% 97% 97%  Weight:      Height:        Intake/Output Summary (Last 24 hours) at 05/20/2019 1528 Last data filed at 05/20/2019 1400 Gross per 24 hour  Intake 2465.07 ml  Output 1980 ml  Net 485.07 ml   Filed Weights   05/18/19 0500 05/19/19 0400 05/20/19 0500  Weight: 57.7 kg 57.4 kg 51.6 kg   Sedated , Intubated, in no apparent distress Symmetrical Chest wall movement, Coarse breath sounds bilaterally, crackles at the bases. RRR,No Gallops,Rubs or new Murmurs, No Parasternal Heave +ve B.Sounds, Abd Soft, No tenderness, No rebound - guarding or rigidity. No Cyanosis, Clubbing or edema, No new Rash or bruise      Lab Results:  Data Reviewed: I have personally reviewed following labs and imaging studies  CBC: Recent Labs  Lab 05/17/19 0413 05/18/19 0410  05/19/19 0430 05/19/19 1101 05/19/19 1700 05/20/19 0115 05/20/19 0546  WBC 11.2* 15.0*  --  52.7*  --  QUESTIONABLE RESULTS, RECOMMEND RECOLLECT TO VERIFY 24.0*  --   HGB 11.2* 11.1*   < > 10.4* 9.9* QUESTIONABLE RESULTS, RECOMMEND RECOLLECT TO VERIFY 10.2* 9.9*  HCT 34.3* 34.9*   < > 32.8* 29.0* QUESTIONABLE RESULTS, RECOMMEND RECOLLECT TO VERIFY 31.1* 29.0*  MCV 89.8 90.4  --  94.0  --  QUESTIONABLE RESULTS, RECOMMEND RECOLLECT TO VERIFY 91.7  --   PLT 315 315  --  336  --  QUESTIONABLE RESULTS, RECOMMEND RECOLLECT TO VERIFY 275  --    < > = values in this interval not displayed.    Basic Metabolic Panel: Recent Labs  Lab 05/17/19 0413 05/18/19 0410 05/18/19 1730 05/18/19 1845 05/18/19 2022 05/19/19 0430 05/19/19 1101 05/19/19 1700 05/20/19 0115 05/20/19 0546  NA 146* 153* 158*  --  157* 156* 150*  --  153* 153*  K 4.2 4.2 3.6  --  3.7 3.9 4.2  --  4.2 4.1  CL 110 118* 122*   --   --  117*  --   --  119*  --   CO2 23 22 21*  --   --  18*  --   --  23  --   GLUCOSE 322* 301* 84  --   --  27*  --   --  203*  --   BUN 70* 103* 96*  --   --  99*  --   --  93*  --   CREATININE 2.81* 2.68* 2.75*  --   --  3.25*  --   --  3.18*  --   CALCIUM 8.8* 8.9 9.0  --   --  8.9  --   --  8.0*  --   MG  --   --  2.5* 2.4  --  1.9  --  2.0 2.1  --   PHOS 4.8* 3.9 2.1* 3.2  --  2.8  --  5.2* 5.3*  --     GFR: Estimated Creatinine Clearance: 12.6 mL/min (A) (by C-G formula based on SCr of 3.18 mg/dL (H)).  Liver Function Tests: Recent Labs  Lab 05/16/19 0500 05/17/19 0413 05/18/19 0410 05/19/19 0430 05/20/19 0115  AST 32 23 26 34 33  ALT 20 18 18 21 19   ALKPHOS 59 58 52 55 72  BILITOT 0.3 0.1* 0.4 0.2* 0.4  PROT 6.9 6.5 6.3* 5.8* 5.0*  ALBUMIN 3.0* 3.1* 2.9* 3.3* 2.7*    Cardiac Enzymes: Recent Labs  Lab 05/13/19 2354  CKTOTAL 189    CBG: Recent Labs  Lab 05/19/19 2004 05/20/19 0000 05/20/19 0353 05/20/19 0759 05/20/19 1213  GLUCAP 218* 211* 176* 157* 242*    Anemia Panel: Recent Labs    05/19/19 0430 05/20/19 0115  FERRITIN 165 215    Recent Results (from the past 240 hour(s))  SARS Coronavirus 2 (CEPHEID - Performed in [facility] hospital lab), Hosp Order     Status: Abnormal   Collection Time: 05/14/19  5:20 AM   Specimen: Nasopharyngeal Swab  Result Value Ref Range Status   SARS Coronavirus 2 POSITIVE (A) NEGATIVE Final    Comment: RESULT CALLED TO, READ BACK BY AND VERIFIED WITH: RN C CHRISCO @0630  05/14/19 BY S GEZAHEGN (NOTE) If result is NEGATIVE SARS-CoV-2 target nucleic acids are NOT DETECTED. The SARS-CoV-2 RNA is generally detectable in upper and lower  respiratory specimens during the acute phase of infection. The lowest  concentration of SARS-CoV-2 viral copies this assay can detect is 250  copies / mL. A negative result does not preclude SARS-CoV-2 infection  and should not be used as the sole basis for treatment or other   patient management decisions.  A negative result may occur with  improper specimen collection / handling, submission of specimen other  than nasopharyngeal swab, presence of viral mutation(s) within the  areas targeted by this assay, and inadequate number of viral copies  (<250 copies / mL). A negative result must be combined with clinical  observations, patient history, and epidemiological information. If result is POSITIVE SARS-CoV-2 target nucleic acids are DETECTED . The SARS-CoV-2 RNA is generally detectable in upper and lower  respiratory specimens during the acute phase of infection.  Positive  results are indicative of active infection with SARS-CoV-2.  Clinical  correlation with patient history and other diagnostic information is  necessary to determine patient infection status.  Positive results do  not rule out bacterial infection or co-infection with other viruses. If result is PRESUMPTIVE POSTIVE SARS-CoV-2 nucleic acids MAY BE PRESENT.   A presumptive positive result was obtained on the submitted specimen  and confirmed on repeat testing.  While 2019 novel coronavirus  (SARS-CoV-2) nucleic acids may be present in  the submitted sample  additional confirmatory testing may be necessary for epidemiological  and / or clinical management purposes  to differentiate between  SARS-CoV-2 and other Sarbecovirus currently known to infect humans.  If clinically indicated additional testing with an alternate test  methodology [phone] ) is advised. The SARS-CoV-2 RNA is generally  detectable in upper and lower respiratory specimens during the acute  phase of infection. The expected result is Negative. Fact Sheet for Patients:  StrictlyIdeas.no Fact Sheet for Healthcare Providers: BankingDealers.co.za This test is not yet approved or cleared by the [place] FDA and has been authorized for detection and/or diagnosis of SARS-CoV-2 by  FDA under an Emergency Use Authorization (EUA).  This EUA will remain in effect (meaning this test can be used) for the duration of the COVID-19 declaration under Section 564(b)(1) of the Act, 21 U.S.C. section 360bbb-3(b)(1), unless the authorization is terminated or revoked sooner. Performed at [facility], [place] [address], [place], [place] [address]   Urine culture     Status: Abnormal   Collection Time: 05/14/19 11:30 AM   Specimen: Urine, Clean Catch  Result Value Ref Range Status   Specimen Description   Final    URINE, CLEAN CATCH Performed at [facility], [place] [address] [place], [place] [address]    Special Requests   Final    NONE Performed at [facility], [place] [address], [place], [place] [address]    Culture (A)  Final    10,000 COLONIES/mL MULTIPLE SPECIES PRESENT, SUGGEST RECOLLECTION   Report Status 05/16/2019 FINAL  Final  MRSA PCR Screening     Status: None   Collection Time: 05/18/19  5:16 AM   Specimen: Nasal Mucosa; Nasopharyngeal  Result Value Ref Range Status   MRSA by PCR NEGATIVE NEGATIVE Final    Comment:        The GeneXpert MRSA Assay (FDA approved for NASAL specimens only), is one component of a comprehensive MRSA colonization surveillance program. It is not intended to diagnose MRSA infection nor to guide or monitor treatment for MRSA infections. Performed at [facility], [place] [address] [place], [place] [address]       Radiology Studies: Dg Abd 1 View  Result Date: 05/18/2019 CLINICAL DATA:  NG tube placement EXAM: ABDOMEN - 1 VIEW COMPARISON:  May 15, 2019 FINDINGS: NG tube tip is in the right upper quadrant, likely in the distal stomach or proximal duodenum. Nonobstructive bowel gas pattern. IMPRESSION: NG tube tip in the Peri pyloric region. Electronically Signed   By: Rolm Baptise M.D.   On: 05/18/2019 19:25   Dg Chest Port 1 View  Result Date: 05/19/2019 CLINICAL  DATA:  Hx of intubation and NG placement EXAM: PORTABLE CHEST 1 VIEW COMPARISON:  Chest x-rays dated 05/18/2019 and 05/16/2019. FINDINGS: Endotracheal tube is well positioned with tip just above the level of the carina. RIGHT IJ central line appears adequately positioned within the SVC. Enteric tube passes below the diaphragm. Patchy predominantly interstitial bibasilar opacities are stable, likely a combination of interstitial edema and small pleural effusions. No pneumothorax seen. IMPRESSION: 1. Endotracheal tube is well positioned with tip just above the level of the carina. 2. Enteric tube passes below the diaphragm. 3. Stable bibasilar opacities, predominantly interstitial, most likely a combination of interstitial edema and small layering pleural effusions, superimposed pneumonia not excluded if febrile. Electronically Signed   By: Franki Cabot M.D.   On: 05/19/2019 10:01   Portable Chest  X-ray  Result Date: 05/18/2019 CLINICAL DATA:  73 year old female COVID-19. Intubated. EXAM: PORTABLE CHEST 1 VIEW COMPARISON:  05/16/2019 and earlier. FINDINGS: Portable AP supine view at 1741 hours. Endotracheal tube tip between the level the clavicles and carina. Enteric tube courses to the left upper quadrant, tip not included. Stable right IJ central line. Stable left chest pacemaker. Stable cardiac size and mediastinal contours. Increased veiling opacity at both lung bases since 05/16/2019. Obscuration of the diaphragm and dense retrocardiac opacity. No pneumothorax or pulmonary edema. Paucity of bowel gas. No acute osseous abnormality identified. IMPRESSION: 1. ET tube in good position. Enteric tube courses to the abdomen, tip not included. 2. Increased veiling opacity at both lung bases since 05/16/2019 may indicate increased bilateral pleural effusions with lower lobe collapse or consolidation. Electronically Signed   By: Genevie Ann M.D.   On: 05/18/2019 19:23   Dg Abd Portable 1v  Result Date: 05/19/2019  CLINICAL DATA:  Acute respiratory failure with hypoxemia. EXAM: PORTABLE ABDOMEN - 1 VIEW COMPARISON:  Plain film of the abdomen dated 05/18/2019. FINDINGS: Enteric tube appears adequately positioned in the stomach. Visualized bowel gas pattern is nonobstructive. No evidence of free intraperitoneal air. IMPRESSION: Enteric tube appears adequately positioned in the stomach. Nonobstructive bowel gas pattern. Electronically Signed   By: Franki Cabot M.D.   On: 05/19/2019 10:02       LOS: 6 days   Phillips Climes MD  [facility] Pager on [email or web address]  05/20/2019, 3:28 PM

## 2019-05-20 NOTE — Progress Notes (Addendum)
NAME:  Betty [name], MRN:  [id number], DOB:  [birth year], LOS: 6 ADMISSION DATE:  05/13/2019, CONSULTATION DATE: July 10 REFERRING MD: Cruzita Lederer, CHIEF COMPLAINT: Dyspnea  Brief History   73 year old female with multiple medical problems admitted on July 9 with worsening dyspnea in setting of severe acute respiratory failure with hypoxemia due to COVID-19 pneumonia.  Past Medical History  COPD NSCLC 6578 CKD Systolic heart failure Hypertension  Significant Hospital Events   7/9 admission 7/10 transfer to ICU July 11 attempted to self extubate July 13 extubated, reintubated later in the day due to encephalopathy, developed hypotension, A. fib with RVR July 14, remains hypotensive on vasopressors, increased NG output. This morning had increased NG output, and shock, requiring Levophed, lactic acid elevated at 4  Consults:  PCCM  Procedures:  7/10 ETT > July 14 extubated, re-intubated >  7/10 R IJ CVL >   Significant Diagnostic Tests:  04/2019 LVEF 30-35%, LVH, LV hypokinesis, RV normal function, minimal valve insufficiencies  Micro Data:  7/9 SARS COV2 >  7/9 Urine culture >   Antimicrobials:  7/9 remdesivir >  7/9 solumedrol >  7/13  7/14 zosyn >   Interim history/subjective:   May 20, 2019: Remains on ventilator with FiO2 40% and PEEP of 5.  Pulse ox 91%.  Tube feed is on hold because of vomiting.  Is on amnio drip.  Is also on Precedex.  Has wheezing on exam.  Objective   Blood pressure (!) 86/55, pulse (!) 106, temperature (!) 101.6 F (38.7 C), temperature source Axillary, resp. rate (!) 27, height 5\' 4"  (1.626 m), weight 51.6 kg, SpO2 94 %. CVP:  [4 mmHg-11 mmHg] 5 mmHg  Vent Mode: PCV FiO2 (%):  [40 %-50 %] 40 % Set Rate:  [24 bmp] 24 bmp PEEP:  [5 cmH20] 5 cmH20 Plateau Pressure:  [11 cmH20-17 cmH20] 12 cmH20   Intake/Output Summary (Last 24 hours) at 05/20/2019 1717 Last data filed at 05/20/2019 1600 Gross per 24 hour  Intake 2607.45 ml  Output 2195 ml   Net 412.45 ml   Filed Weights   05/18/19 0500 05/19/19 0400 05/20/19 0500  Weight: 57.7 kg 57.4 kg 51.6 kg    General Appearance:  Looks criticall ill OBESE - no Head:  Normocephalic, without obvious abnormality, atraumatic Eyes:  PERRL - yes, conjunctiva/corneas - muddy     Ears:  Normal external ear canals, both ears Nose:  G tube - no Throat:  ETT TUBE - yes , OG tube - yes Neck:  Supple,  No enlargement/tenderness/nodules Lungs: Clear to auscultation bilaterally, Ventilator   Synchrony - yes. With wheee Heart:  S1 and S2 normal, no murmur, CVP - n.  Pressors - yes - levophed gtt but also amio gtt Abdomen:  Soft, no masses, no organomegaly Genitalia / Rectal:  Not done Extremities:  Extremities- inact Skin:  ntact in exposed areas . Sacral area - not examiend Neurologic:  Sedation - fent gtt, precdexe gtt , -> RASS - -3     LABS    LABS    PULMONARY Recent Labs  Lab 05/15/19 1839 05/15/19 2049 05/18/19 2022 05/19/19 1101 05/20/19 0546  PHART 7.538* 7.440 7.309*  --  7.409  PCO2ART 21.6* 35.0 42.5  --  30.6*  PO2ART 45.0* 447.0* 62.0*  --  63.0*  HCO3 18.5* 23.7 21.3 19.0* 19.5*  TCO2 19* 25 23 20* 20*  O2SAT 89.0 100.0 89.0 75.0 93.0    CBC Recent Labs  Lab 05/19/19 0430  05/19/19 1700 05/20/19 0115 05/20/19 0546  HGB 10.4*   < > QUESTIONABLE RESULTS, RECOMMEND RECOLLECT TO VERIFY 10.2* 9.9*  HCT 32.8*   < > QUESTIONABLE RESULTS, RECOMMEND RECOLLECT TO VERIFY 31.1* 29.0*  WBC 52.7*  --  QUESTIONABLE RESULTS, RECOMMEND RECOLLECT TO VERIFY 24.0*  --   PLT 336  --  QUESTIONABLE RESULTS, RECOMMEND RECOLLECT TO VERIFY 275  --    < > = values in this interval not displayed.    COAGULATION No results for input(s): INR in the last 168 hours.  CARDIAC  No results for input(s): TROPONINI in the last 168 hours. No results for input(s): PROBNP in the last 168 hours.   CHEMISTRY Recent Labs  Lab 05/17/19 0413 05/18/19 0410 05/18/19 1730 05/18/19 1845  05/18/19 2022 05/19/19 0430 05/19/19 1101 05/19/19 1700 05/20/19 0115 05/20/19 0546  NA 146* 153* 158*  --  157* 156* 150*  --  153* 153*  K 4.2 4.2 3.6  --  3.7 3.9 4.2  --  4.2 4.1  CL 110 118* 122*  --   --  117*  --   --  119*  --   CO2 23 22 21*  --   --  18*  --   --  23  --   GLUCOSE 322* 301* 84  --   --  27*  --   --  203*  --   BUN 70* 103* 96*  --   --  99*  --   --  93*  --   CREATININE 2.81* 2.68* 2.75*  --   --  3.25*  --   --  3.18*  --   CALCIUM 8.8* 8.9 9.0  --   --  8.9  --   --  8.0*  --   MG  --   --  2.5* 2.4  --  1.9  --  2.0 2.1  --   PHOS 4.8* 3.9 2.1* 3.2  --  2.8  --  5.2* 5.3*  --    Estimated Creatinine Clearance: 12.6 mL/min (A) (by C-G formula based on SCr of 3.18 mg/dL (H)).   LIVER Recent Labs  Lab 05/16/19 0500 05/17/19 0413 05/18/19 0410 05/19/19 0430 05/20/19 0115  AST 32 23 26 34 33  ALT 20 18 18 21 19   ALKPHOS 59 58 52 55 72  BILITOT 0.3 0.1* 0.4 0.2* 0.4  PROT 6.9 6.5 6.3* 5.8* 5.0*  ALBUMIN 3.0* 3.1* 2.9* 3.3* 2.7*     INFECTIOUS Recent Labs  Lab 05/14/19 1310 05/19/19 0935 05/19/19 1700  LATICACIDVEN  --  4.0* 3.7*  PROCALCITON 0.59  --   --      ENDOCRINE CBG (last 3)  Recent Labs    05/20/19 0759 05/20/19 1213 05/20/19 1607  GLUCAP 157* 242* 220*         IMAGING x48h  - image(s) personally visualized  -   highlighted in bold Dg Abd 1 View  Result Date: 05/18/2019 CLINICAL DATA:  NG tube placement EXAM: ABDOMEN - 1 VIEW COMPARISON:  May 15, 2019 FINDINGS: NG tube tip is in the right upper quadrant, likely in the distal stomach or proximal duodenum. Nonobstructive bowel gas pattern. IMPRESSION: NG tube tip in the Peri pyloric region. Electronically Signed   By: Rolm Baptise M.D.   On: 05/18/2019 19:25   Dg Chest Port 1 View  Result Date: 05/19/2019 CLINICAL DATA:  Hx of intubation and NG placement EXAM: PORTABLE CHEST 1 VIEW COMPARISON:  Chest  x-rays dated 05/18/2019 and 05/16/2019. FINDINGS: Endotracheal  tube is well positioned with tip just above the level of the carina. RIGHT IJ central line appears adequately positioned within the SVC. Enteric tube passes below the diaphragm. Patchy predominantly interstitial bibasilar opacities are stable, likely a combination of interstitial edema and small pleural effusions. No pneumothorax seen. IMPRESSION: 1. Endotracheal tube is well positioned with tip just above the level of the carina. 2. Enteric tube passes below the diaphragm. 3. Stable bibasilar opacities, predominantly interstitial, most likely a combination of interstitial edema and small layering pleural effusions, superimposed pneumonia not excluded if febrile. Electronically Signed   By: Franki Cabot M.D.   On: 05/19/2019 10:01   Portable Chest X-ray  Result Date: 05/18/2019 CLINICAL DATA:  73 year old female COVID-19. Intubated. EXAM: PORTABLE CHEST 1 VIEW COMPARISON:  05/16/2019 and earlier. FINDINGS: Portable AP supine view at 1741 hours. Endotracheal tube tip between the level the clavicles and carina. Enteric tube courses to the left upper quadrant, tip not included. Stable right IJ central line. Stable left chest pacemaker. Stable cardiac size and mediastinal contours. Increased veiling opacity at both lung bases since 05/16/2019. Obscuration of the diaphragm and dense retrocardiac opacity. No pneumothorax or pulmonary edema. Paucity of bowel gas. No acute osseous abnormality identified. IMPRESSION: 1. ET tube in good position. Enteric tube courses to the abdomen, tip not included. 2. Increased veiling opacity at both lung bases since 05/16/2019 may indicate increased bilateral pleural effusions with lower lobe collapse or consolidation. Electronically Signed   By: Genevie Ann M.D.   On: 05/18/2019 19:23   Dg Abd Portable 1v  Result Date: 05/19/2019 CLINICAL DATA:  Acute respiratory failure with hypoxemia. EXAM: PORTABLE ABDOMEN - 1 VIEW COMPARISON:  Plain film of the abdomen dated 05/18/2019.  FINDINGS: Enteric tube appears adequately positioned in the stomach. Visualized bowel gas pattern is nonobstructive. No evidence of free intraperitoneal air. IMPRESSION: Enteric tube appears adequately positioned in the stomach. Nonobstructive bowel gas pattern. Electronically Signed   By: Franki Cabot M.D.   On: 05/19/2019 10:02     Resolved Hospital Problem list     Assessment & Plan:  Acute respiratory failure with hypoxemia: At this point mostly due to encephalopathy and inability to protect airway  suspect COPD contributing   05/20/2019 - > does not  meet criteria for SBT/Extubation in setting of Acute Respiratory Failure due to wheezin and encephalopathy  PLAN  - full vent support Continue full mechanical ventilatory support Ventilator associated pneumonia prevention protocol Continue Solu-Medrol for total 10 days Continue remdesivir  Continue ipratropium/albuterol Continue sedation with Precedex and fentanyl for ventilator synchrony  Encephalopathy: Multifactorial: July 14 in part due to sepsis and shock, renal failure Need to continue sedation for ventilator synchrony using fentanyl and Precedex targeting RA SS target of 0 to -1 Treat underlying illnesses such as sepsis and renal failure  Shock: Septic, co-ox not consistent with cardiogenic shock Hold BiDil Monitor CVP Volume status exam on July 14: No role for more fluids, already administered several liters of crystalloid overnight Change levophed to neo on account of A Fib ; MAP > 65 Broad-spectrum antibiotics   COPD Combivent q6h  Congestive heart failure: Venous O2 saturation and physical exam not consistent with cardiogenic shock on 7/14 Hold bidil    CKD> now with non-oliguric failure  05/20/2019  - improving   Nephrology consult, may need CVVHD in next few days if family agreeable to this Monitor BMET and UOP Replace electrolytes as needed  Goals of care: DNR if arrests, full support otherwise    Best practice:  Diet: tube feeding Pain/Anxiety/Delirium protocol (if indicated): n/a VAP protocol (if indicated): n/a DVT prophylaxis: sub q heparin GI prophylaxis: Pantoprazole for stress ulcer prophylaxis Glucose control: SSI per TRH Mobility: bed rest, range of motion exercises OK Code Status: DNR  Family Communication: per TRH Disposition: icu     ATTESTATION & SIGNATURE   The patient Betty [name] is critically ill with multiple organ systems failure and requires high complexity decision making for assessment and support, frequent evaluation and titration of therapies, application of advanced monitoring technologies and extensive interpretation of multiple databases.   Critical Care Time devoted to patient care services described in this note is  30  Minutes. This time reflects time of care of this signee Dr Brand Males. This critical care time does not reflect procedure time, or teaching time or supervisory time of PA/NP/Med student/Med Resident etc but could involve care discussion time     Dr. Brand Males, M.D., [facility].C.P Pulmonary and Critical Care Medicine Staff Physician [facility] Pager: [phone], If no answer or between  15:00h - 7:00h: call [phone]  05/20/2019 5:27 PM

## 2019-05-20 NOTE — Progress Notes (Signed)
Subjective:  Difficult to tell but pressors seem to be decreasing- UOP back up- was 1900 last 24 hours - BUN, crt and bicarb as well as sodium improved from yesterday FIo2 is down as well   Objective Vital signs in last 24 hours: Vitals:   05/20/19 1200 05/20/19 1215 05/20/19 1230 05/20/19 1245  BP: 106/62 (!) 114/55 94/62 109/61  Pulse: (!) 103 (!) 105 (!) 105 (!) 106  Resp: (!) 26 (!) 25 (!) 28 (!) 29  Temp: 98.8 F (37.1 C)     TempSrc: Axillary     SpO2: 97% 97% 96% 97%  Weight:      Height:       Weight change: -5.8 kg  Intake/Output Summary (Last 24 hours) at 05/20/2019 1327 Last data filed at 05/20/2019 1200 Gross per 24 hour  Intake 2486.37 ml  Output 2040 ml  Net 446.37 ml   Assessment/Plan: 73 year old BF with multiple medical issues including COPD, history of lung CA and CKD.  Now presenting with COVID and complications including vent dependence and recent hemodynamic instability 1.Renal- A on CRF- baseline crt seems to be in the mid 2's.  Seemed to be kind of stable to improving before events 7/13.  With that being said she has improved since said events. AT this time is nonoliguric and I see no needs for RRT.  I will continue to follow closely however.   2. Hypertension/volume  - acute hypotension that seems to be better.  The high sodium indicates free water deficit.  At last check sodium better- continue free water and will increase volume some  3. Elytes- K, phos and Mag all OK  -  not concerning 4. Anemia  - reasonable for now- no action needed  5.  Acid/base- serum bicard 23- pH over 7.4-  No acute need for action     [facility]    Labs: Basic Metabolic Panel: Recent Labs  Lab 05/18/19 1730  05/19/19 0430 05/19/19 1101 05/19/19 1700 05/20/19 0115 05/20/19 0546  NA 158*   < > 156* 150*  --  153* 153*  K 3.6   < > 3.9 4.2  --  4.2 4.1  CL 122*  --  117*  --   --  119*  --   CO2 21*  --  18*  --   --  23  --   GLUCOSE 84  --  27*  --   --   203*  --   BUN 96*  --  99*  --   --  93*  --   CREATININE 2.75*  --  3.25*  --   --  3.18*  --   CALCIUM 9.0  --  8.9  --   --  8.0*  --   PHOS 2.1*   < > 2.8  --  5.2* 5.3*  --    < > = values in this interval not displayed.   Liver Function Tests: Recent Labs  Lab 05/18/19 0410 05/19/19 0430 05/20/19 0115  AST 26 34 33  ALT 18 21 19   ALKPHOS 52 55 72  BILITOT 0.4 0.2* 0.4  PROT 6.3* 5.8* 5.0*  ALBUMIN 2.9* 3.3* 2.7*   No results for input(s): LIPASE, AMYLASE in the last 168 hours. No results for input(s): AMMONIA in the last 168 hours. CBC: Recent Labs  Lab 05/17/19 0413 05/18/19 0410  05/19/19 0430  05/19/19 1700 05/20/19 0115 05/20/19 0546  WBC 11.2* 15.0*  --  52.7*  --  QUESTIONABLE RESULTS, RECOMMEND RECOLLECT TO VERIFY 24.0*  --   HGB 11.2* 11.1*   < > 10.4*   < > QUESTIONABLE RESULTS, RECOMMEND RECOLLECT TO VERIFY 10.2* 9.9*  HCT 34.3* 34.9*   < > 32.8*   < > QUESTIONABLE RESULTS, RECOMMEND RECOLLECT TO VERIFY 31.1* 29.0*  MCV 89.8 90.4  --  94.0  --  QUESTIONABLE RESULTS, RECOMMEND RECOLLECT TO VERIFY 91.7  --   PLT 315 315  --  336  --  QUESTIONABLE RESULTS, RECOMMEND RECOLLECT TO VERIFY 275  --    < > = values in this interval not displayed.   Cardiac Enzymes: Recent Labs  Lab 05/13/19 2354  CKTOTAL 189   CBG: Recent Labs  Lab 05/19/19 2004 05/20/19 0000 05/20/19 0353 05/20/19 0759 05/20/19 1213  GLUCAP 218* 211* 176* 157* 242*    Iron Studies:  Recent Labs    05/20/19 0115  FERRITIN 215   Studies/Results: Dg Abd 1 View  Result Date: 05/18/2019 CLINICAL DATA:  NG tube placement EXAM: ABDOMEN - 1 VIEW COMPARISON:  May 15, 2019 FINDINGS: NG tube tip is in the right upper quadrant, likely in the distal stomach or proximal duodenum. Nonobstructive bowel gas pattern. IMPRESSION: NG tube tip in the Peri pyloric region. Electronically Signed   By: Rolm Baptise M.D.   On: 05/18/2019 19:25   Dg Chest Port 1 View  Result Date:  05/19/2019 CLINICAL DATA:  Hx of intubation and NG placement EXAM: PORTABLE CHEST 1 VIEW COMPARISON:  Chest x-rays dated 05/18/2019 and 05/16/2019. FINDINGS: Endotracheal tube is well positioned with tip just above the level of the carina. RIGHT IJ central line appears adequately positioned within the SVC. Enteric tube passes below the diaphragm. Patchy predominantly interstitial bibasilar opacities are stable, likely a combination of interstitial edema and small pleural effusions. No pneumothorax seen. IMPRESSION: 1. Endotracheal tube is well positioned with tip just above the level of the carina. 2. Enteric tube passes below the diaphragm. 3. Stable bibasilar opacities, predominantly interstitial, most likely a combination of interstitial edema and small layering pleural effusions, superimposed pneumonia not excluded if febrile. Electronically Signed   By: Franki Cabot M.D.   On: 05/19/2019 10:01   Portable Chest X-ray  Result Date: 05/18/2019 CLINICAL DATA:  73 year old female COVID-19. Intubated. EXAM: PORTABLE CHEST 1 VIEW COMPARISON:  05/16/2019 and earlier. FINDINGS: Portable AP supine view at 1741 hours. Endotracheal tube tip between the level the clavicles and carina. Enteric tube courses to the left upper quadrant, tip not included. Stable right IJ central line. Stable left chest pacemaker. Stable cardiac size and mediastinal contours. Increased veiling opacity at both lung bases since 05/16/2019. Obscuration of the diaphragm and dense retrocardiac opacity. No pneumothorax or pulmonary edema. Paucity of bowel gas. No acute osseous abnormality identified. IMPRESSION: 1. ET tube in good position. Enteric tube courses to the abdomen, tip not included. 2. Increased veiling opacity at both lung bases since 05/16/2019 may indicate increased bilateral pleural effusions with lower lobe collapse or consolidation. Electronically Signed   By: Genevie Ann M.D.   On: 05/18/2019 19:23   Dg Abd Portable 1v  Result  Date: 05/19/2019 CLINICAL DATA:  Acute respiratory failure with hypoxemia. EXAM: PORTABLE ABDOMEN - 1 VIEW COMPARISON:  Plain film of the abdomen dated 05/18/2019. FINDINGS: Enteric tube appears adequately positioned in the stomach. Visualized bowel gas pattern is nonobstructive. No evidence of free intraperitoneal air. IMPRESSION: Enteric tube appears adequately positioned in the stomach. Nonobstructive bowel gas  pattern. Electronically Signed   By: Franki Cabot M.D.   On: 05/19/2019 10:02   Medications: Infusions: . amiodarone 30 mg/hr (05/20/19 1200)  . dexmedetomidine (PRECEDEX) IV infusion 0.9 mcg/kg/hr (05/20/19 1200)  . dextrose 40 mL/hr at 05/20/19 1200  . feeding supplement (VITAL AF 1.2 CAL) 1,000 mL (05/20/19 1311)  . fentaNYL infusion INTRAVENOUS Stopped (05/20/19 1007)  . norepinephrine (LEVOPHED) Adult infusion 20 mcg/min (05/20/19 1200)  . piperacillin-tazobactam (ZOSYN)  IV Stopped (05/20/19 0720)    Scheduled Medications: . chlorhexidine  15 mL Mouth/Throat BID  . Chlorhexidine Gluconate Cloth  6 each Topical Daily  . clonazepam  1 mg Oral BID  . docusate  100 mg Oral BID  . donepezil  10 mg Oral QHS  . feeding supplement (PRO-STAT SUGAR FREE 64)  30 mL Per Tube BID  . free water  200 mL Per Tube Q4H  . insulin aspart  0-20 Units Subcutaneous Q4H  . insulin aspart  6 Units Subcutaneous Q4H  . insulin detemir  15 Units Subcutaneous BID  . ipratropium-albuterol  3 mL Nebulization Q6H  . methylPREDNISolone (SOLU-MEDROL) injection  20 mg Intravenous Q24H  . pantoprazole (PROTONIX) IV  40 mg Intravenous Q12H  . QUEtiapine  50 mg Oral BID  . sodium chloride flush  3 mL Intravenous Q12H    have reviewed scheduled and prn medications.  Physical Exam: As patient is on full COVID precautions a full exam was not done in order to preserve PPE and to limit exposure to other patients and providers.  Place of service was the [facility]   05/20/2019,1:27 PM  LOS: 6 days

## 2019-05-20 NOTE — Care Management (Signed)
Case manager will continue to monitor for appropriate disposition as she medically improves. May God Bless her to do so.   Ricki Miller, RN BSN Case Manager [phone]

## 2019-05-20 NOTE — Progress Notes (Signed)
Patient's son Chrissie Noa called and updated on patient's status and questions answered.

## 2019-05-20 NOTE — Care Management Important Message (Signed)
Important Message  Patient Details  Name: Betty [name] MRN: [id number] Date of Birth: 04-27-1946   Medicare Important Message Given:  Yes - Important Message mailed due to current National Emergency   Verbal consent obtained due to current National Emergency  Relationship to patient: Child Contact Name: Hadlea Furuya Call Date: 05/20/19  Time: 1142 Phone: [phone] Outcome: Spoke with contact Important Message mailed to: Patient address on file     [name] 05/20/2019, 11:42 AM

## 2019-05-21 ENCOUNTER — Inpatient Hospital Stay (HOSPITAL_COMMUNITY): Payer: Medicare Other

## 2019-05-21 LAB — GLUCOSE, CAPILLARY
Glucose-Capillary: 79 mg/dL (ref 70–99)
Glucose-Capillary: 100 mg/dL — ABNORMAL HIGH (ref 70–99)
Glucose-Capillary: 100 mg/dL — ABNORMAL HIGH (ref 70–99)
Glucose-Capillary: 109 mg/dL — ABNORMAL HIGH (ref 70–99)
Glucose-Capillary: 95 mg/dL (ref 70–99)

## 2019-05-21 LAB — COMPREHENSIVE METABOLIC PANEL
ALT: 17 U/L (ref 0–44)
AST: 31 U/L (ref 15–41)
Albumin: 2.4 g/dL — ABNORMAL LOW (ref 3.5–5.0)
Alkaline Phosphatase: 70 U/L (ref 38–126)
Anion gap: 10 (ref 5–15)
BUN: 85 mg/dL — ABNORMAL HIGH (ref 8–23)
CO2: 22 mmol/L (ref 22–32)
Calcium: 8.5 mg/dL — ABNORMAL LOW (ref 8.9–10.3)
Chloride: 119 mmol/L — ABNORMAL HIGH (ref 98–111)
Creatinine, Ser: 3.26 mg/dL — ABNORMAL HIGH (ref 0.44–1.00)
GFR calc Af Amer: 15 mL/min — ABNORMAL LOW (ref >60)
GFR calc non Af Amer: 13 mL/min — ABNORMAL LOW (ref >60)
Glucose, Bld: 50 mg/dL — ABNORMAL LOW (ref 70–99)
Potassium: 4.8 mmol/L (ref 3.5–5.1)
Sodium: 151 mmol/L — ABNORMAL HIGH (ref 135–145)
Total Bilirubin: 0.5 mg/dL (ref 0.3–1.2)
Total Protein: 4.9 g/dL — ABNORMAL LOW (ref 6.5–8.1)

## 2019-05-21 LAB — CBC
HCT: 26 % — ABNORMAL LOW (ref 36.0–46.0)
Hemoglobin: 8.2 g/dL — ABNORMAL LOW (ref 12.0–15.0)
MCH: 29.3 pg (ref 26.0–34.0)
MCHC: 31.5 g/dL (ref 30.0–36.0)
MCV: 92.9 fL (ref 80.0–100.0)
Platelets: 212 10*3/uL (ref 150–400)
RBC: 2.8 MIL/uL — ABNORMAL LOW (ref 3.87–5.11)
RDW: 13.9 % (ref 11.5–15.5)
WBC: 17.5 10*3/uL — ABNORMAL HIGH (ref 4.0–10.5)
nRBC: 0.1 % (ref 0.0–0.2)

## 2019-05-21 LAB — C-REACTIVE PROTEIN: CRP: 14.1 mg/dL — ABNORMAL HIGH (ref <1.0)

## 2019-05-21 LAB — MAGNESIUM: Magnesium: 2.3 mg/dL (ref 1.7–2.4)

## 2019-05-21 LAB — PROCALCITONIN: Procalcitonin: 6.44 ng/mL

## 2019-05-21 LAB — D-DIMER, QUANTITATIVE: D-Dimer, Quant: 2.16 ug/mL-FEU — ABNORMAL HIGH (ref 0.00–0.50)

## 2019-05-21 MED ORDER — DEXMEDETOMIDINE HCL IN NACL 400 MCG/100ML IV SOLN
0.4000 ug/kg/h | INTRAVENOUS | Status: DC
  Administered 2019-05-22: 20:00:00 0.49 ug/kg/h via INTRAVENOUS
  Administered 2019-05-22: 06:00:00 0.5 ug/kg/h via INTRAVENOUS
  Administered 2019-05-23: 0.4 ug/kg/h via INTRAVENOUS
  Administered 2019-05-24: 0.9 ug/kg/h via INTRAVENOUS
  Administered 2019-05-24: 0.8 ug/kg/h via INTRAVENOUS
  Administered 2019-05-24: 0.9 ug/kg/h via INTRAVENOUS
  Administered 2019-05-24: 0.8 ug/kg/h via INTRAVENOUS
  Administered 2019-05-25 – 2019-05-26 (×4): 0.9 ug/kg/h via INTRAVENOUS
  Filled 2019-05-21 (×5): qty 100
  Filled 2019-05-21: qty 200
  Filled 2019-05-21 (×5): qty 100

## 2019-05-21 MED ORDER — PHENYLEPHRINE HCL-NACL 40-0.9 MG/250ML-% IV SOLN
0.0000 ug/min | INTRAVENOUS | Status: DC
  Administered 2019-05-21: 45 ug/min via INTRAVENOUS
  Administered 2019-05-21: 55 ug/min via INTRAVENOUS
  Filled 2019-05-21 (×2): qty 250

## 2019-05-21 MED ORDER — FREE WATER
400.0000 mL | Status: DC
  Administered 2019-05-21 – 2019-05-25 (×32): 400 mL

## 2019-05-21 MED ORDER — INSULIN DETEMIR 100 UNIT/ML ~~LOC~~ SOLN
8.0000 [IU] | Freq: Once | SUBCUTANEOUS | Status: AC
  Administered 2019-05-21: 22:00:00 8 [IU] via SUBCUTANEOUS
  Filled 2019-05-21: qty 0.08

## 2019-05-21 NOTE — Progress Notes (Signed)
Updated son via telephone.

## 2019-05-21 NOTE — Progress Notes (Signed)
Subjective:  pressors seem to be decreasing- UOP  1500 last 24 hours - BUN better, crt up slightly  bicarb stable as well as sodium improved   Objective Vital signs in last 24 hours: Vitals:   05/21/19 0900 05/21/19 1000 05/21/19 1015 05/21/19 1100  BP: (!) 108/50 (!) 87/43 (!) 99/48 (!) 112/53  Pulse: 62 66 63 (!) 59  Resp: (!) 21 (!) 29 (!) 24 18  Temp:      TempSrc:      SpO2: 97% 95% 96% 98%  Weight:      Height:       Weight change: 7 kg  Intake/Output Summary (Last 24 hours) at 05/21/2019 1155 Last data filed at 05/21/2019 1100 Gross per 24 hour  Intake 6259.49 ml  Output 2115 ml  Net 4144.49 ml   Assessment/Plan: 73 year old BF with multiple medical issues including COPD, history of lung CA and CKD.  Now presenting with COVID and complications including vent dependence and recent hemodynamic instability 1.Renal- A on CRF- baseline crt seems to be in the mid 2's.  Seemed to be kind of stable to improving before events 7/13.  With that being said she has improved since said events. AT this time is nonoliguric and I see no needs for RRT.  I will continue to follow closely however.   2. Hypertension/volume  - acute hypotension that seems to be better.  The high sodium indicates free water deficit.  At last check sodium better- continue free water- CCM increased free water volume some  3. Elytes- K, phos and Mag all OK  -  not concerning 4. Anemia  - was reasonable- did drop into the 8's today.  Supportive care 5.  Acid/base- serum bicard 22- pH over 7.4-  No acute need for action     [facility]    Labs: Basic Metabolic Panel: Recent Labs  Lab 05/19/19 0430  05/19/19 1700 05/20/19 0115 05/20/19 0546 05/21/19 0500  NA 156*   < >  --  153* 153* 151*  K 3.9   < >  --  4.2 4.1 4.8  CL 117*  --   --  119*  --  119*  CO2 18*  --   --  23  --  22  GLUCOSE 27*  --   --  203*  --  50*  BUN 99*  --   --  93*  --  85*  CREATININE 3.25*  --   --  3.18*  --  3.26*   CALCIUM 8.9  --   --  8.0*  --  8.5*  PHOS 2.8  --  5.2* 5.3*  --   --    < > = values in this interval not displayed.   Liver Function Tests: Recent Labs  Lab 05/19/19 0430 05/20/19 0115 05/21/19 0500  AST 34 33 31  ALT 21 19 17   ALKPHOS 55 72 70  BILITOT 0.2* 0.4 0.5  PROT 5.8* 5.0* 4.9*  ALBUMIN 3.3* 2.7* 2.4*   No results for input(s): LIPASE, AMYLASE in the last 168 hours. No results for input(s): AMMONIA in the last 168 hours. CBC: Recent Labs  Lab 05/18/19 0410  05/19/19 0430  05/19/19 1700 05/20/19 0115 05/20/19 0546 05/21/19 0500  WBC 15.0*  --  52.7*  --  QUESTIONABLE RESULTS, RECOMMEND RECOLLECT TO VERIFY 24.0*  --  17.5*  HGB 11.1*   < > 10.4*   < > QUESTIONABLE RESULTS, RECOMMEND RECOLLECT TO VERIFY  10.2* 9.9* 8.2*  HCT 34.9*   < > 32.8*   < > QUESTIONABLE RESULTS, RECOMMEND RECOLLECT TO VERIFY 31.1* 29.0* 26.0*  MCV 90.4  --  94.0  --  QUESTIONABLE RESULTS, RECOMMEND RECOLLECT TO VERIFY 91.7  --  92.9  PLT 315  --  336  --  QUESTIONABLE RESULTS, RECOMMEND RECOLLECT TO VERIFY 275  --  212   < > = values in this interval not displayed.   Cardiac Enzymes: No results for input(s): CKTOTAL, CKMB, CKMBINDEX, TROPONINI in the last 168 hours. CBG: Recent Labs  Lab 05/20/19 1607 05/20/19 1935 05/20/19 2337 05/21/19 0322 05/21/19 0843  GLUCAP 220* 205* 163* 79 100*    Iron Studies:  Recent Labs    05/20/19 0115  FERRITIN 215   Studies/Results: Dg Chest Port 1 View  Result Date: 05/21/2019 CLINICAL DATA:  Endotracheal tube present. EXAM: PORTABLE CHEST 1 VIEW COMPARISON:  Radiograph May 19, 2019. FINDINGS: Stable cardiomediastinal silhouette. Endotracheal and nasogastric tubes are unchanged in position. Left-sided pacemaker is unchanged in position. Right internal jugular catheter is unchanged in position. Stable bibasilar opacities are noted which are predominantly interstitial, concerning for edema or possibly inflammation. No significant pleural  effusion is noted. Bony thorax is unremarkable. IMPRESSION: Stable support apparatus. Stable bibasilar opacities as described above. Electronically Signed   By: Marijo Conception M.D.   On: 05/21/2019 08:56   Medications: Infusions: . sodium chloride 10 mL/hr at 05/21/19 1100  . dexmedetomidine (PRECEDEX) IV infusion 0.7 mcg/kg/hr (05/21/19 1100)  . dextrose Stopped (05/20/19 1431)  . feeding supplement (VITAL AF 1.2 CAL) Stopped (05/21/19 0900)  . fentaNYL infusion INTRAVENOUS 25 mcg/hr (05/21/19 1100)  . phenylephrine (NEO-SYNEPHRINE) Adult infusion 55 mcg/min (05/21/19 1100)  . piperacillin-tazobactam (ZOSYN)  IV 2.25 g (05/21/19 0507)    Scheduled Medications: . chlorhexidine  15 mL Mouth/Throat BID  . Chlorhexidine Gluconate Cloth  6 each Topical Daily  . clonazepam  1 mg Oral BID  . docusate  100 mg Oral BID  . donepezil  10 mg Oral QHS  . feeding supplement (PRO-STAT SUGAR FREE 64)  30 mL Per Tube BID  . free water  400 mL Per Tube Q3H  . insulin aspart  0-20 Units Subcutaneous Q4H  . insulin aspart  6 Units Subcutaneous Q4H  . insulin detemir  15 Units Subcutaneous BID  . ipratropium-albuterol  3 mL Nebulization Q6H  . mouth rinse  15 mL Mouth Rinse 10 times per day  . methylPREDNISolone (SOLU-MEDROL) injection  20 mg Intravenous Q24H  . pantoprazole (PROTONIX) IV  40 mg Intravenous Q12H  . QUEtiapine  50 mg Oral BID  . sodium chloride flush  3 mL Intravenous Q12H    have reviewed scheduled and prn medications.  Physical Exam: As patient is on full COVID precautions a full exam was not done in order to preserve PPE and to limit exposure to other patients and providers.  Place of service was [facility]   05/21/2019,11:55 AM  LOS: 7 days

## 2019-05-21 NOTE — Progress Notes (Signed)
NAME:  Betty [name], MRN:  [id number], DOB:  August 10, 1946, LOS: 7 ADMISSION DATE:  05/13/2019, CONSULTATION DATE: July 10 REFERRING MD: Cruzita Lederer, CHIEF COMPLAINT: Dyspnea  Brief History   73 year old female with multiple medical problems admitted on July 9 with worsening dyspnea in setting of severe acute respiratory failure with hypoxemia due to COVID-19 pneumonia.  Past Medical History  COPD NSCLC 4097 CKD Systolic heart failure Hypertension  Significant Hospital Events   7/9 admission 7/10 transfer to ICU July 11 attempted to self extubate July 13 extubated, reintubated later in the day due to encephalopathy, developed hypotension, A. fib with RVR July 14, remains hypotensive on vasopressors, increased NG output. This morning had increased NG output, and shock, requiring Levophed, lactic acid elevated at 4   May 20, 2019: Remains on ventilator with FiO2 40% and PEEP of 5.  Pulse ox 91%.  Tube feed is on hold because of vomiting.  Is on amnio drip.  Is also on Precedex.  Has wheezing on exam.  Consults:  PCCM  Procedures:  7/10 ETT > July 14 extubated, re-intubated >  7/10 R IJ CVL >   Significant Diagnostic Tests:  04/2019 LVEF 30-35%, LVH, LV hypokinesis, RV normal function, minimal valve insufficiencies  Micro Data:  7/9 SARS COV2 >  7/9 Urine culture >   Antimicrobials:  7/9 remdesivir >  7/9 solumedrol >  7/13  7/14 zosyn >   Interim history/subjective:   7/16- remains on vent with 40% fio2, peep 5. Did SBT but on on fent gtt,. Also on neo gtt and amio gtt. Being paced. Hypernatremic with rising Na . PCT up at 6. FEbrile over night 101.6 F overnight  Failed fent wean with tachypnea Objective   Blood pressure (!) 99/48, pulse 63, temperature 98.3 F (36.8 C), temperature source Oral, resp. rate (!) 24, height 5\' 4"  (1.626 m), weight 58.6 kg, SpO2 96 %. CVP:  [5 mmHg-6 mmHg] 6 mmHg  Vent Mode: PSV;CPAP FiO2 (%):  [40 %] 40 % Set Rate:  [24 bmp] 24 bmp PEEP:  [5  cmH20] 5 cmH20 Pressure Support:  [8 cmH20] 8 cmH20 Plateau Pressure:  [12 cmH20] 12 cmH20   Intake/Output Summary (Last 24 hours) at 05/21/2019 1059 Last data filed at 05/21/2019 1000 Gross per 24 hour  Intake 6216.33 ml  Output 2115 ml  Net 4101.33 ml   Filed Weights   05/19/19 0400 05/20/19 0500 05/21/19 0500  Weight: 57.4 kg 51.6 kg 58.6 kg   General Appearance:  Looks criticall il Head:  Normocephalic, without obvious abnormality, atraumatic Eyes:  PERRL - yes, conjunctiva/corneas - muddy     Ears:  Normal external ear canals, both ears Nose:  G tube - no Throat:  ETT TUBE - yes , OG tube - yes Neck:  Supple,  No enlargement/tenderness/nodules Lungs: Clear to auscultation bilaterally, Ventilator   Synchrony - yes Heart:  S1 and S2 normal, no murmur, CVP - no.  Pressors - yes Abdomen:  Soft, no masses, no organomegaly Genitalia / Rectal:  Not done Extremities:  Extremities- intact Skin:  ntact in exposed areas . Sacral area - not eamiend Neurologic:  Sedation - precedex gtt and fent gtt -> RASS - -3    LABS    PULMONARY Recent Labs  Lab 05/15/19 1839 05/15/19 2049 05/18/19 2022 05/19/19 1101 05/20/19 0546  PHART 7.538* 7.440 7.309*  --  7.409  PCO2ART 21.6* 35.0 42.5  --  30.6*  PO2ART 45.0* 447.0* 62.0*  --  63.0*  HCO3 18.5* 23.7 21.3 19.0* 19.5*  TCO2 19* 25 23 20* 20*  O2SAT 89.0 100.0 89.0 75.0 93.0    CBC Recent Labs  Lab 05/19/19 1700 05/20/19 0115 05/20/19 0546 05/21/19 0500  HGB QUESTIONABLE RESULTS, RECOMMEND RECOLLECT TO VERIFY 10.2* 9.9* 8.2*  HCT QUESTIONABLE RESULTS, RECOMMEND RECOLLECT TO VERIFY 31.1* 29.0* 26.0*  WBC QUESTIONABLE RESULTS, RECOMMEND RECOLLECT TO VERIFY 24.0*  --  17.5*  PLT QUESTIONABLE RESULTS, RECOMMEND RECOLLECT TO VERIFY 275  --  212    COAGULATION No results for input(s): INR in the last 168 hours.  CARDIAC  No results for input(s): TROPONINI in the last 168 hours. No results for input(s): PROBNP in the last  168 hours.   CHEMISTRY Recent Labs  Lab 05/18/19 0410 05/18/19 1730 05/18/19 1845  05/19/19 0430 05/19/19 1101 05/19/19 1700 05/20/19 0115 05/20/19 0546 05/21/19 0500  NA 153* 158*  --    < > 156* 150*  --  153* 153* 151*  K 4.2 3.6  --    < > 3.9 4.2  --  4.2 4.1 4.8  CL 118* 122*  --   --  117*  --   --  119*  --  119*  CO2 22 21*  --   --  18*  --   --  23  --  22  GLUCOSE 301* 84  --   --  27*  --   --  203*  --  50*  BUN 103* 96*  --   --  99*  --   --  93*  --  85*  CREATININE 2.68* 2.75*  --   --  3.25*  --   --  3.18*  --  3.26*  CALCIUM 8.9 9.0  --   --  8.9  --   --  8.0*  --  8.5*  MG  --  2.5* 2.4  --  1.9  --  2.0 2.1  --  2.3  PHOS 3.9 2.1* 3.2  --  2.8  --  5.2* 5.3*  --   --    < > = values in this interval not displayed.   Estimated Creatinine Clearance: 13.1 mL/min (A) (by C-G formula based on SCr of 3.26 mg/dL (H)).   LIVER Recent Labs  Lab 05/17/19 0413 05/18/19 0410 05/19/19 0430 05/20/19 0115 05/21/19 0500  AST 23 26 34 33 31  ALT 18 18 21 19 17   ALKPHOS 58 52 55 72 70  BILITOT 0.1* 0.4 0.2* 0.4 0.5  PROT 6.5 6.3* 5.8* 5.0* 4.9*  ALBUMIN 3.1* 2.9* 3.3* 2.7* 2.4*     INFECTIOUS Recent Labs  Lab 05/14/19 1310 05/19/19 0935 05/19/19 1700 05/21/19 0500  LATICACIDVEN  --  4.0* 3.7*  --   PROCALCITON 0.59  --   --  6.44     ENDOCRINE CBG (last 3)  Recent Labs    05/20/19 2337 05/21/19 0322 05/21/19 0843  GLUCAP 163* 79 100*         IMAGING x48h  - image(s) personally visualized  -   highlighted in bold Dg Chest Port 1 View  Result Date: 05/21/2019 CLINICAL DATA:  Endotracheal tube present. EXAM: PORTABLE CHEST 1 VIEW COMPARISON:  Radiograph May 19, 2019. FINDINGS: Stable cardiomediastinal silhouette. Endotracheal and nasogastric tubes are unchanged in position. Left-sided pacemaker is unchanged in position. Right internal jugular catheter is unchanged in position. Stable bibasilar opacities are noted which are predominantly  interstitial, concerning for edema or possibly inflammation. No significant  pleural effusion is noted. Bony thorax is unremarkable. IMPRESSION: Stable support apparatus. Stable bibasilar opacities as described above. Electronically Signed   By: Marijo Conception M.D.   On: 05/21/2019 08:56     Resolved Hospital Problem list     Assessment & Plan:  Acute respiratory failure with hypoxemia: At this point mostly due to encephalopathy and inability to protect airway  suspect COPD contributing   05/21/2019 - > does not meet criteria for SBT/Extubation in setting of Acute Respiratory Failure due to failing seation wean and circulatory shic   PLAN  - full vent support Continue full mechanical ventilatory support Ventilator associated pneumonia prevention protocol Continue Solu-Medrol for total 10 days Continue remdesivir  Continue ipratropium/albuterol Continue sedation with Precedex and fentanyl for ventilator synchrony  Encephalopathy: Multifactorial: July 14 in part due to sepsis and shock, renal failure Need to continue sedation for ventilator synchrony using fentanyl and Precedex targeting RA SS target of 0 to -1 Treat underlying illnesses such as sepsis and renal failure  Shock: Septic, co-ox not consistent with cardiogenic shock Neo for MAP > 65  Severe sepsis  - febrile overnight with high PCT  - continue zosyn - sputum and urine culture - do 05/21/2019    COPD Combivent q6h  Congestive heart failure: Venous O2 saturation and physical exam not consistent with cardiogenic shock on 7/14 Hold bidil  A Fib  - on amio for several days but beoing paced 05/21/2019 -dc admio  CKD> now with non-oliguric failure  05/21/2019  -worse with worsening hypernatermia  Plan  - increase free water   Goals of care: DNR if arrests, full support otherwise   Best practice:  Diet: tube feeding Pain/Anxiety/Delirium protocol (if indicated): n/a VAP protocol (if indicated): n/a DVT  prophylaxis: sub q heparin GI prophylaxis: Pantoprazole for stress ulcer prophylaxis Glucose control: SSI per TRH Mobility: bed rest, range of motion exercises OK Code Status: DNR  Family Communication: per TRH Disposition: icu     ATTESTATION & SIGNATURE   The patient Betty [name] is critically ill with multiple organ systems failure and requires high complexity decision making for assessment and support, frequent evaluation and titration of therapies, application of advanced monitoring technologies and extensive interpretation of multiple databases.   Critical Care Time devoted to patient care services described in this note is  30  Minutes. This time reflects time of care of this signee Dr Brand Males. This critical care time does not reflect procedure time, or teaching time or supervisory time of PA/NP/Med student/Med Resident etc but could involve care discussion time     Dr. Brand Males, M.D., [facility].C.P Pulmonary and Critical Care Medicine Staff Physician [facility] Pager: [phone], If no answer or between  15:00h - 7:00h: call [phone]  05/21/2019 10:59 AM

## 2019-05-21 NOTE — Progress Notes (Signed)
PROGRESS NOTE  Betty [name] ZCH:[id number] DOB: July 13, 1946 DOA: 05/13/2019  PCP: Josetta Huddle, MD  Brief History/Interval Summary:  - 73 year old female with history of coronary artery disease, non-small cell lung cancer in 2016, hypertension, hyperlipidemia, dementia, COPD, chronic kidney disease stage IV, chronic combined CHF who was brought to the hospital by the patient's son due to increased confusion and weakness.  She was diagnosed with coronavirus in the ED on 7/9.  She was also found to be hypoxic requiring 2 L nasal cannula.  She was admitted to the hospital and placed on steroids along with Remdesivir.  Subjective/Interval History:  Patient had blood with stools overnight, she remains afebrile, no other significant events as discussed with staff.  Assessment/Plan:  Acute Hypoxic Resp. Failure due to Acute Covid 19 Viral Illness  Vent Mode: PSV;CPAP FiO2 (%):  [40 %] 40 % Set Rate:  [24 bmp] 24 bmp PEEP:  [5 cmH20] 5 cmH20 Pressure Support:  [8 cmH20] 8 cmH20     Component Value Date/Time   PHART 7.409 05/20/2019 0546   PCO2ART 30.6 (L) 05/20/2019 0546   PO2ART 63.0 (L) 05/20/2019 0546   HCO3 19.5 (L) 05/20/2019 0546   TCO2 20 (L) 05/20/2019 0546   ACIDBASEDEF 5.0 (H) 05/20/2019 0546   O2SAT 93.0 05/20/2019 0546    COVID-19 Labs  Recent Labs    05/19/19 0430 05/20/19 0115 05/21/19 0500  DDIMER 4.90* 1.52* 2.16*  FERRITIN 165 215  --   CRP 4.3* 14.4* 14.1*    Lab Results  Component Value Date   SARSCOV2NAA POSITIVE (A) 05/14/2019     Fever: Patient has been afebrile Oxygen requirements: Back on mechanical ventilation.  50% FiO2.  Saturating in the 90s.      Antibiotics: Started on Zosyn 7/14.   Remdesivir: Completed course of Remdesivir Steroids: Solu-Medrol  Diuretics: Given Lasix x1 on 7/10.  Not continued due to increase in creatinine Actemra: Actemra x1 on 7/10 Convalescent Plasma: Not given yet Vitamin C and Zinc: Continue DVT Prophylaxis:  Heparin will be stopped due to bloody drainage from the NG tube.  SCDs.  Patient had to be reintubated 7/13 evening.  She remains on full mechanical ventilatory support,, vent management per PCCM, on sedation with Precedex and fentanyl . -Treated with Remdesivir, steroids, and received Actemra .  Sepsis -No significant leukocytosis, elevated procalcitonin, elevated lactic acid, source of sepsis could not be identified, no acute finding on chest x-ray, will obtain urinalysis, blood cultures, continue with empiric coverage with Zosyn, trend procalcitonin, white blood cells and lactic acid.  Shock Likely a combination of septic as well as cardiogenic shock.  She does have cool extremities.  Venous blood gas shows saturation of 75.  Continue with pressors support .  Atrial fibrillation with RVR She was on amiodarone drip, currently rate controlled, paced rhythm, . -No anticoagulation currently in the setting of lower GI bleed .  Lower GI bleed -Patient with blood in her stools, gastric lavage with no evidence of blood, her hemoglobin dropped by 2 g over last 24 hours, monitor CBC closely, will hold anticoagulation.  Hypernatremia Will start on free water via OGT   Chronic systolic CHF Most recent echocardiogram done in June 2020 showed a EF of 30 to 35%.  Chest x-ray did raise concern for pulmonary edema.  However patient is currently hypotensive.  Acute on chronic kidney disease stage IV Baseline creatinine around 2.0-3.0.  Patient with worsening renal function, currently has stabilized, remains with good urine output, no  indication for CRRT per renal .  Hyperglycemia due to steroids Elevated CBGs are due to steroids.  She has been on multiple courses of prednisone in the last 1 to 2 months for acute gout which is the likely reason for her hyperglycemia.  And also likely reason for her elevated HbA1c.  Continue long-acting insulin and SSI for now.  CBG should improve as steroid is tapered  down.     History of dementia/acute metabolic encephalopathy Appears to be mild dementia.  Patient is able to live independently.  Apparently has poor short-term memory.  Continue to monitor.  Patient has encephalopathy most likely due to acute illness and medications.  Continue to monitor.  No obvious deficits noted.  History of non-small cell lung cancer and COPD She is status post right lower lobe lobectomy and has completed 4 cycles of adjuvant chemotherapy.  Lung cancer appears to be in remission.    History of gout Initially it was thought that patient is on chronic prednisone at home but review of previous notes did not mention prednisone.  Discussed on steroids with patient's son.  Apparently over the last 1 to 2 months she has had issues with acute gout for which she was prescribed courses of steroids.  She does not take steroid on a regular basis.  However she was on a course of prednisone at the time of admission.  Nutrition Speech therapy to evaluate tube feedings on hold currently  Goals of care Patient appears to have taken a turn for the worse. Previous MD discussed in detail with patient's son.  He is agreeable to DNR currently.  He does want to pursue CRRT and dialysis if that is an option.  He understands that patient is getting worse.  He understands that she may not survive this hospitalization.  DVT Prophylaxis: Heparin changed over to SCDs PUD Prophylaxis: Protonix Code Status: DNR Family Communication:will discuss with son on daily basis. Disposition Plan: Will remain in ICU  Reason for Visit: Acute respiratory disease due to COVID-19  Consultants: Pulmonology  Procedures:  Intubation 7/10 Central line placement right IJ 7/10  Antibiotics: Anti-infectives (From admission, onward)   Start     Dose/Rate Route Frequency Ordered Stop   05/19/19 1030  piperacillin-tazobactam (ZOSYN) IVPB 2.25 g     2.25 g 100 mL/hr over 30 Minutes Intravenous Every 8 hours  05/19/19 1023     05/15/19 1230  remdesivir 100 mg in sodium chloride 0.9 % 250 mL IVPB     100 mg 500 mL/hr over 30 Minutes Intravenous Every 24 hours 05/14/19 1148 05/18/19 1340   05/14/19 1230  remdesivir 200 mg in sodium chloride 0.9 % 250 mL IVPB     200 mg 500 mL/hr over 30 Minutes Intravenous Once 05/14/19 1148 05/14/19 1551        Medications:  Scheduled: . chlorhexidine  15 mL Mouth/Throat BID  . Chlorhexidine Gluconate Cloth  6 each Topical Daily  . clonazepam  1 mg Oral BID  . docusate  100 mg Oral BID  . donepezil  10 mg Oral QHS  . feeding supplement (PRO-STAT SUGAR FREE 64)  30 mL Per Tube BID  . free water  400 mL Per Tube Q3H  . insulin aspart  0-20 Units Subcutaneous Q4H  . insulin aspart  6 Units Subcutaneous Q4H  . insulin detemir  15 Units Subcutaneous BID  . ipratropium-albuterol  3 mL Nebulization Q6H  . mouth rinse  15 mL Mouth Rinse 10 times per  day  . methylPREDNISolone (SOLU-MEDROL) injection  20 mg Intravenous Q24H  . pantoprazole (PROTONIX) IV  40 mg Intravenous Q12H  . QUEtiapine  50 mg Oral BID  . sodium chloride flush  3 mL Intravenous Q12H   Continuous: . sodium chloride 250 mL (05/21/19 1517)  . dexmedetomidine (PRECEDEX) IV infusion 0.6 mcg/kg/hr (05/21/19 1400)  . dextrose Stopped (05/20/19 1431)  . feeding supplement (VITAL AF 1.2 CAL) Stopped (05/21/19 0900)  . fentaNYL infusion INTRAVENOUS 25 mcg/hr (05/21/19 1400)  . phenylephrine (NEO-SYNEPHRINE) Adult infusion 52 mcg/min (05/21/19 1400)  . piperacillin-tazobactam (ZOSYN)  IV 100 mL/hr at 05/21/19 1400   EXB:MWUXLK chloride, acetaminophen, bisacodyl, fentaNYL, midazolam, midazolam, ondansetron **OR** ondansetron (ZOFRAN) IV, oxyCODONE, polyethylene glycol   Objective:  Vital Signs  Vitals:   05/21/19 1200 05/21/19 1207 05/21/19 1300 05/21/19 1400  BP: (!) 118/56 (!) 118/56 (!) 117/55 (!) 117/54  Pulse: (!) 59 60 (!) 59 (!) 59  Resp: 19 18 18 20   Temp: 98.2 F (36.8 C)      TempSrc: Oral     SpO2: 99% 99% 99% 99%  Weight:      Height:        Intake/Output Summary (Last 24 hours) at 05/21/2019 1528 Last data filed at 05/21/2019 1400 Gross per 24 hour  Intake 5838.16 ml  Output 2430 ml  Net 3408.16 ml   Filed Weights   05/19/19 0400 05/20/19 0500 05/21/19 0500  Weight: 57.4 kg 51.6 kg 58.6 kg    Sedated, Intubated, in no apparent distress Symmetrical Chest wall movement, Good air movement bilaterally, scattered rales RRR,No Gallops,Rubs or new Murmurs, No Parasternal Heave +ve B.Sounds, Abd Soft, No tenderness, No rebound - guarding or rigidity. No Cyanosis, Clubbing or edema, No new Rash or bruise     Lab Results:  Data Reviewed: I have personally reviewed following labs and imaging studies  CBC: Recent Labs  Lab 05/18/19 0410  05/19/19 0430 05/19/19 1101 05/19/19 1700 05/20/19 0115 05/20/19 0546 05/21/19 0500  WBC 15.0*  --  52.7*  --  QUESTIONABLE RESULTS, RECOMMEND RECOLLECT TO VERIFY 24.0*  --  17.5*  HGB 11.1*   < > 10.4* 9.9* QUESTIONABLE RESULTS, RECOMMEND RECOLLECT TO VERIFY 10.2* 9.9* 8.2*  HCT 34.9*   < > 32.8* 29.0* QUESTIONABLE RESULTS, RECOMMEND RECOLLECT TO VERIFY 31.1* 29.0* 26.0*  MCV 90.4  --  94.0  --  QUESTIONABLE RESULTS, RECOMMEND RECOLLECT TO VERIFY 91.7  --  92.9  PLT 315  --  336  --  QUESTIONABLE RESULTS, RECOMMEND RECOLLECT TO VERIFY 275  --  212   < > = values in this interval not displayed.    Basic Metabolic Panel: Recent Labs  Lab 05/18/19 0410 05/18/19 1730 05/18/19 1845  05/19/19 0430 05/19/19 1101 05/19/19 1700 05/20/19 0115 05/20/19 0546 05/21/19 0500  NA 153* 158*  --    < > 156* 150*  --  153* 153* 151*  K 4.2 3.6  --    < > 3.9 4.2  --  4.2 4.1 4.8  CL 118* 122*  --   --  117*  --   --  119*  --  119*  CO2 22 21*  --   --  18*  --   --  23  --  22  GLUCOSE 301* 84  --   --  27*  --   --  203*  --  50*  BUN 103* 96*  --   --  99*  --   --  93*  --  85*  CREATININE 2.68* 2.75*  --   --   3.25*  --   --  3.18*  --  3.26*  CALCIUM 8.9 9.0  --   --  8.9  --   --  8.0*  --  8.5*  MG  --  2.5* 2.4  --  1.9  --  2.0 2.1  --  2.3  PHOS 3.9 2.1* 3.2  --  2.8  --  5.2* 5.3*  --   --    < > = values in this interval not displayed.    GFR: Estimated Creatinine Clearance: 13.1 mL/min (A) (by C-G formula based on SCr of 3.26 mg/dL (H)).  Liver Function Tests: Recent Labs  Lab 05/17/19 0413 05/18/19 0410 05/19/19 0430 05/20/19 0115 05/21/19 0500  AST 23 26 34 33 31  ALT 18 18 21 19 17   ALKPHOS 58 52 55 72 70  BILITOT 0.1* 0.4 0.2* 0.4 0.5  PROT 6.5 6.3* 5.8* 5.0* 4.9*  ALBUMIN 3.1* 2.9* 3.3* 2.7* 2.4*    Cardiac Enzymes: No results for input(s): CKTOTAL, CKMB, CKMBINDEX, TROPONINI in the last 168 hours.  CBG: Recent Labs  Lab 05/20/19 1935 05/20/19 2337 05/21/19 0322 05/21/19 0843 05/21/19 1217  GLUCAP 205* 163* 79 100* 100*    Anemia Panel: Recent Labs    05/19/19 0430 05/20/19 0115  FERRITIN 165 215    Recent Results (from the past 240 hour(s))  SARS Coronavirus 2 (CEPHEID - Performed in [place] hospital lab), Hosp Order     Status: Abnormal   Collection Time: 05/14/19  5:20 AM   Specimen: Nasopharyngeal Swab  Result Value Ref Range Status   SARS Coronavirus 2 POSITIVE (A) NEGATIVE Final    Comment: RESULT CALLED TO, READ BACK BY AND VERIFIED WITH: RN C CHRISCO @0630  05/14/19 BY S GEZAHEGN (NOTE) If result is NEGATIVE SARS-CoV-2 target nucleic acids are NOT DETECTED. The SARS-CoV-2 RNA is generally detectable in upper and lower  respiratory specimens during the acute phase of infection. The lowest  concentration of SARS-CoV-2 viral copies this assay can detect is 250  copies / mL. A negative result does not preclude SARS-CoV-2 infection  and should not be used as the sole basis for treatment or other  patient management decisions.  A negative result may occur with  improper specimen collection / handling, submission of specimen other  than  nasopharyngeal swab, presence of viral mutation(s) within the  areas targeted by this assay, and inadequate number of viral copies  (<250 copies / mL). A negative result must be combined with clinical  observations, patient history, and epidemiological information. If result is POSITIVE SARS-CoV-2 target nucleic acids are DETECTED . The SARS-CoV-2 RNA is generally detectable in upper and lower  respiratory specimens during the acute phase of infection.  Positive  results are indicative of active infection with SARS-CoV-2.  Clinical  correlation with patient history and other diagnostic information is  necessary to determine patient infection status.  Positive results do  not rule out bacterial infection or co-infection with other viruses. If result is PRESUMPTIVE POSTIVE SARS-CoV-2 nucleic acids MAY BE PRESENT.   A presumptive positive result was obtained on the submitted specimen  and confirmed on repeat testing.  While 2019 novel coronavirus  (SARS-CoV-2) nucleic acids may be present in the submitted sample  additional confirmatory testing may be necessary for epidemiological  and / or clinical management purposes  to differentiate between  SARS-CoV-2 and other Sarbecovirus currently  known to infect humans.  If clinically indicated additional testing with an alternate test  methodology [phone] ) is advised. The SARS-CoV-2 RNA is generally  detectable in upper and lower respiratory specimens during the acute  phase of infection. The expected result is Negative. Fact Sheet for Patients:  StrictlyIdeas.no Fact Sheet for Healthcare Providers: BankingDealers.co.za This test is not yet approved or cleared by the [place] FDA and has been authorized for detection and/or diagnosis of SARS-CoV-2 by FDA under an Emergency Use Authorization (EUA).  This EUA will remain in effect (meaning this test can be used) for the duration of the  COVID-19 declaration under Section 564(b)(1) of the Act, 21 U.S.C. section 360bbb-3(b)(1), unless the authorization is terminated or revoked sooner. Performed at [facility], [place] [address] [place], [place] [address]   Urine culture     Status: Abnormal   Collection Time: 05/14/19 11:30 AM   Specimen: Urine, Clean Catch  Result Value Ref Range Status   Specimen Description   Final    URINE, CLEAN CATCH Performed at [facility], [place] [address], [place], [place] [address]    Special Requests   Final    NONE Performed at [facility], [place] [address], [place], [place] [address]    Culture (A)  Final    10,000 COLONIES/mL MULTIPLE SPECIES PRESENT, SUGGEST RECOLLECTION   Report Status 05/16/2019 FINAL  Final  MRSA PCR Screening     Status: None   Collection Time: 05/18/19  5:16 AM   Specimen: Nasal Mucosa; Nasopharyngeal  Result Value Ref Range Status   MRSA by PCR NEGATIVE NEGATIVE Final    Comment:        The GeneXpert MRSA Assay (FDA approved for NASAL specimens only), is one component of a comprehensive MRSA colonization surveillance program. It is not intended to diagnose MRSA infection nor to guide or monitor treatment for MRSA infections. Performed at [facility], [place] [address] [place], [place] [address]       Radiology Studies: Dg Chest Port 1 View  Result Date: 05/21/2019 CLINICAL DATA:  Endotracheal tube present. EXAM: PORTABLE CHEST 1 VIEW COMPARISON:  Radiograph May 19, 2019. FINDINGS: Stable cardiomediastinal silhouette. Endotracheal and nasogastric tubes are unchanged in position. Left-sided pacemaker is unchanged in position. Right internal jugular catheter is unchanged in position. Stable bibasilar opacities are noted which are predominantly interstitial, concerning for edema or possibly inflammation. No significant pleural effusion is noted. Bony thorax is unremarkable. IMPRESSION: Stable  support apparatus. Stable bibasilar opacities as described above. Electronically Signed   By: Marijo Conception M.D.   On: 05/21/2019 08:56       LOS: 7 days   Phillips Climes MD  [facility] Pager on [email or web address]  05/21/2019, 3:28 PM

## 2019-05-21 NOTE — Progress Notes (Signed)
Called patient's son and provided an update.

## 2019-05-21 NOTE — Consult Note (Signed)
   [facility] Inpatient Consult   05/21/2019  Betty [name] 29-Jan-1946 [id number]  Patient screened for extreme high risk score for unplanned readmission score for hospitalizations to check if potential [facility] Management services. History with [facility] Care Management in the past.  Review of patient's medical record reveals from MD brief progress notes patient remains in ICU on the ventilator.  MD progress notes are as follows:  73 year old female with multiple medical problems admitted on July 9 with worsening dyspnea in setting of severe acute respiratory failure with hypoxemia due to COVID-19 pneumonia.  Primary Care Provider is Josetta Huddle, MD   Plan:  Medicare NG ACO member with history of [facility] Care Management in the past for disposition follow up for needs.  Following for needs and disposition. Currently on vent in ICU.   Please place a [facility] Care Management consult as appropriate and for questions contact:   Natividad Brood, RN BSN [facility] Liaison  [phone] business mobile phone Toll free office [phone]  Fax number: [phone] [email or web address]

## 2019-05-21 NOTE — Progress Notes (Signed)
Paged Dr. Denton Brick regarding patient's levemir order for 15 units tonight and tube feeds were held from 0900-1600 today and last CBG was 95 at 2000.  MD gave order to give 8 units of levemir instead of 15 tonight.

## 2019-05-22 ENCOUNTER — Inpatient Hospital Stay (HOSPITAL_COMMUNITY): Payer: Medicare Other

## 2019-05-22 LAB — CBC
HCT: 23.6 % — ABNORMAL LOW (ref 36.0–46.0)
Hemoglobin: 7.5 g/dL — ABNORMAL LOW (ref 12.0–15.0)
MCH: 29.6 pg (ref 26.0–34.0)
MCHC: 31.8 g/dL (ref 30.0–36.0)
MCV: 93.3 fL (ref 80.0–100.0)
Platelets: 181 10*3/uL (ref 150–400)
RBC: 2.53 MIL/uL — ABNORMAL LOW (ref 3.87–5.11)
RDW: 14.2 % (ref 11.5–15.5)
WBC: 15 10*3/uL — ABNORMAL HIGH (ref 4.0–10.5)
nRBC: 0 % (ref 0.0–0.2)

## 2019-05-22 LAB — URINALYSIS, ROUTINE W REFLEX MICROSCOPIC
Bilirubin Urine: NEGATIVE
Glucose, UA: NEGATIVE mg/dL
Ketones, ur: NEGATIVE mg/dL
Leukocytes,Ua: NEGATIVE
Nitrite: NEGATIVE
Protein, ur: NEGATIVE mg/dL
Specific Gravity, Urine: 1.012 (ref 1.005–1.030)
pH: 5 (ref 5.0–8.0)

## 2019-05-22 LAB — COMPREHENSIVE METABOLIC PANEL
ALT: 20 U/L (ref 0–44)
AST: 42 U/L — ABNORMAL HIGH (ref 15–41)
Albumin: 2.4 g/dL — ABNORMAL LOW (ref 3.5–5.0)
Alkaline Phosphatase: 69 U/L (ref 38–126)
Anion gap: 11 (ref 5–15)
BUN: 91 mg/dL — ABNORMAL HIGH (ref 8–23)
CO2: 18 mmol/L — ABNORMAL LOW (ref 22–32)
Calcium: 8.3 mg/dL — ABNORMAL LOW (ref 8.9–10.3)
Chloride: 118 mmol/L — ABNORMAL HIGH (ref 98–111)
Creatinine, Ser: 3.14 mg/dL — ABNORMAL HIGH (ref 0.44–1.00)
GFR calc Af Amer: 16 mL/min — ABNORMAL LOW (ref >60)
GFR calc non Af Amer: 14 mL/min — ABNORMAL LOW (ref >60)
Glucose, Bld: 143 mg/dL — ABNORMAL HIGH (ref 70–99)
Potassium: 4.6 mmol/L (ref 3.5–5.1)
Sodium: 147 mmol/L — ABNORMAL HIGH (ref 135–145)
Total Bilirubin: 0.3 mg/dL (ref 0.3–1.2)
Total Protein: 5 g/dL — ABNORMAL LOW (ref 6.5–8.1)

## 2019-05-22 LAB — C-REACTIVE PROTEIN: CRP: 7.5 mg/dL — ABNORMAL HIGH (ref <1.0)

## 2019-05-22 LAB — LACTIC ACID, PLASMA: Lactic Acid, Venous: 1.4 mmol/L (ref 0.5–1.9)

## 2019-05-22 LAB — GLUCOSE, CAPILLARY
Glucose-Capillary: 123 mg/dL — ABNORMAL HIGH (ref 70–99)
Glucose-Capillary: 126 mg/dL — ABNORMAL HIGH (ref 70–99)
Glucose-Capillary: 143 mg/dL — ABNORMAL HIGH (ref 70–99)
Glucose-Capillary: 156 mg/dL — ABNORMAL HIGH (ref 70–99)
Glucose-Capillary: 157 mg/dL — ABNORMAL HIGH (ref 70–99)
Glucose-Capillary: 182 mg/dL — ABNORMAL HIGH (ref 70–99)
Glucose-Capillary: 88 mg/dL (ref 70–99)

## 2019-05-22 LAB — ABO/RH: ABO/RH(D): A POS

## 2019-05-22 LAB — PROCALCITONIN: Procalcitonin: 4.99 ng/mL

## 2019-05-22 LAB — MAGNESIUM: Magnesium: 2.2 mg/dL (ref 1.7–2.4)

## 2019-05-22 LAB — PREPARE RBC (CROSSMATCH)

## 2019-05-22 LAB — D-DIMER, QUANTITATIVE: D-Dimer, Quant: 1.68 ug/mL-FEU — ABNORMAL HIGH (ref 0.00–0.50)

## 2019-05-22 MED ORDER — VANCOMYCIN HCL 10 G IV SOLR
1250.0000 mg | INTRAVENOUS | Status: AC
  Administered 2019-05-22: 1250 mg via INTRAVENOUS
  Filled 2019-05-22: qty 1000

## 2019-05-22 MED ORDER — MIDAZOLAM HCL 2 MG/2ML IJ SOLN
1.0000 mg | INTRAMUSCULAR | Status: DC | PRN
  Administered 2019-05-24: 4 mg via INTRAVENOUS
  Administered 2019-05-25 (×2): 2 mg via INTRAVENOUS
  Filled 2019-05-22 (×2): qty 2
  Filled 2019-05-22: qty 4
  Filled 2019-05-22: qty 2

## 2019-05-22 MED ORDER — SODIUM CHLORIDE 0.9% IV SOLUTION
Freq: Once | INTRAVENOUS | Status: AC
  Administered 2019-05-22: 14:00:00 via INTRAVENOUS

## 2019-05-22 MED ORDER — VANCOMYCIN VARIABLE DOSE PER UNSTABLE RENAL FUNCTION (PHARMACIST DOSING): Status: DC

## 2019-05-22 NOTE — Progress Notes (Signed)
Called son, Chrissie Noa, to provide update.

## 2019-05-22 NOTE — Progress Notes (Signed)
PROGRESS NOTE  Betty [name] KNL:[id number] DOB: 1946-06-12 DOA: 05/13/2019  PCP: Josetta Huddle, MD  Brief History/Interval Summary:  - 73 year old female with history of coronary artery disease, non-small cell lung cancer in 2016, hypertension, hyperlipidemia, dementia, COPD, chronic kidney disease stage IV, chronic combined CHF who was brought to the hospital by the patient's son due to increased confusion and weakness.  She was diagnosed with coronavirus in the ED on 7/9.  She was also found to be hypoxic requiring 2 L nasal cannula.  She was admitted to the hospital and placed on steroids along with Remdesivir.  Subjective/Interval History:  Remains with blood in her stools, febrile 101.6 overnight, otherwise no significant events per staff .  Assessment/Plan:  Acute Hypoxic Resp. Failure due to Acute Covid 19 Viral Illness  Vent Mode: PSV;CPAP FiO2 (%):  [40 %] 40 % Set Rate:  [24 bmp] 24 bmp PEEP:  [5 cmH20] 5 cmH20 Pressure Support:  [5 cmH20-8 cmH20] 8 cmH20     Component Value Date/Time   PHART 7.409 05/20/2019 0546   PCO2ART 30.6 (L) 05/20/2019 0546   PO2ART 63.0 (L) 05/20/2019 0546   HCO3 19.5 (L) 05/20/2019 0546   TCO2 20 (L) 05/20/2019 0546   ACIDBASEDEF 5.0 (H) 05/20/2019 0546   O2SAT 93.0 05/20/2019 0546    COVID-19 Labs  Recent Labs    05/20/19 0115 05/21/19 0500 05/22/19 0500  DDIMER 1.52* 2.16* 1.68*  FERRITIN 215  --   --   CRP 14.4* 14.1* 7.5*    Lab Results  Component Value Date   SARSCOV2NAA POSITIVE (A) 05/14/2019     Fever: Febrile 101.6 overnight Oxygen requirements: Remains on PSVT mode, tolerating pressure support, FiO2 of 40%, with PEEP of 5 . Antibiotics: Started on Zosyn 7/14.  Vancomycin started 7/17 Remdesivir: Completed course of Remdesivir Steroids: Solu-Medrol  Diuretics: Given Lasix x1 on 7/10.  Not continued due to increase in creatinine Actemra: Actemra x1 on 7/10 Convalescent Plasma: Not given yet Vitamin C and Zinc:  Continue DVT Prophylaxis: Heparin will be stopped due to bloody drainage from the NG tube.  SCDs.  Patient had to be reintubated 7/13 evening.  She remains on full mechanical ventilatory support,, vent management per PCCM, on sedation with Precedex and fentanyl .  He is tolerating pressure support -Treated with Remdesivir, steroids, and received Actemra .  Sepsis -No significant leukocytosis, but with elevated procalcitonin, so far source could not be identified, she is empirically on Zosyn, will add vancomycin .   Shock Likely a combination of septic as well as cardiogenic shock.  She does have cool extremities.  Venous blood gas shows saturation of 75.  Continue with pressors support .  She remains on Neo-Synephrine  Atrial fibrillation with RVR She was on amiodarone drip, currently rate controlled, paced rhythm, . -No anticoagulation currently in the setting of lower GI bleed .  Lower GI bleed -Patient with blood in her stools, gastric lavage with no evidence of blood, her hemoglobin dropped to 7.5 today, will receive 1 unit PRBC, continue to hold anticoagulation.  Hypernatremia Improving on free water via OGT   Chronic systolic CHF Most recent echocardiogram done in June 2020 showed a EF of 30 to 35%.  Chest x-ray did raise concern for pulmonary edema.  However patient is currently hypotensive.  Acute on chronic kidney disease stage IV Baseline creatinine around 2.0-3.0.  Patient with worsening renal function, currently has stabilized, remains with good urine output, no indication for CRRT per renal .  Hyperglycemia due to steroids Elevated CBGs are due to steroids.  She has been on multiple courses of prednisone in the last 1 to 2 months for acute gout which is the likely reason for her hyperglycemia.  And also likely reason for her elevated HbA1c.  Continue long-acting insulin and SSI for now.  CBG should improve as steroid is tapered down.     History of dementia/acute  metabolic encephalopathy Appears to be mild dementia.  Patient is able to live independently.  Apparently has poor short-term memory.  Continue to monitor.  Patient has encephalopathy most likely due to acute illness and medications.  Continue to monitor.  No obvious deficits noted.  History of non-small cell lung cancer and COPD She is status post right lower lobe lobectomy and has completed 4 cycles of adjuvant chemotherapy.  Lung cancer appears to be in remission.    History of gout Initially it was thought that patient is on chronic prednisone at home but review of previous notes did not mention prednisone.  Discussed on steroids with patient's son.  Apparently over the last 1 to 2 months she has had issues with acute gout for which she was prescribed courses of steroids.  She does not take steroid on a regular basis.  However she was on a course of prednisone at the time of admission.  Nutrition Speech therapy to evaluate tube feedings on hold currently  Goals of care Patient appears to have taken a turn for the worse. Previous MD discussed in detail with patient's son.  He is agreeable to DNR currently.  He does want to pursue CRRT and dialysis if that is an option.  He understands that patient is getting worse.  He understands that she may not survive this hospitalization.  DVT Prophylaxis: Heparin changed over to SCDs PUD Prophylaxis: Protonix Code Status: DNR Family Communication:will discuss with son on daily basis. Disposition Plan: Will remain in ICU  Reason for Visit: Acute respiratory disease due to COVID-19  Consultants: Pulmonology  Procedures:  Intubation 7/10 Central line placement right IJ 7/10  Antibiotics: Anti-infectives (From admission, onward)   Start     Dose/Rate Route Frequency Ordered Stop   05/19/19 1030  piperacillin-tazobactam (ZOSYN) IVPB 2.25 g     2.25 g 100 mL/hr over 30 Minutes Intravenous Every 8 hours 05/19/19 1023     05/15/19 1230  remdesivir  100 mg in sodium chloride 0.9 % 250 mL IVPB     100 mg 500 mL/hr over 30 Minutes Intravenous Every 24 hours 05/14/19 1148 05/18/19 1340   05/14/19 1230  remdesivir 200 mg in sodium chloride 0.9 % 250 mL IVPB     200 mg 500 mL/hr over 30 Minutes Intravenous Once 05/14/19 1148 05/14/19 1551        Medications:  Scheduled: . chlorhexidine  15 mL Mouth/Throat BID  . Chlorhexidine Gluconate Cloth  6 each Topical Daily  . docusate  100 mg Oral BID  . feeding supplement (PRO-STAT SUGAR FREE 64)  30 mL Per Tube BID  . free water  400 mL Per Tube Q3H  . insulin aspart  0-20 Units Subcutaneous Q4H  . insulin aspart  6 Units Subcutaneous Q4H  . insulin detemir  15 Units Subcutaneous BID  . ipratropium-albuterol  3 mL Nebulization Q6H  . mouth rinse  15 mL Mouth Rinse 10 times per day  . methylPREDNISolone (SOLU-MEDROL) injection  20 mg Intravenous Q24H  . pantoprazole (PROTONIX) IV  40 mg Intravenous Q12H  .  QUEtiapine  50 mg Oral BID  . sodium chloride flush  3 mL Intravenous Q12H  . vancomycin variable dose per unstable renal function (pharmacist dosing)   Does not apply See admin instructions   Continuous: . sodium chloride 10 mL/hr at 05/22/19 1500  . dexmedetomidine (PRECEDEX) IV infusion 0.5 mcg/kg/hr (05/22/19 1500)  . dextrose Stopped (05/20/19 1431)  . feeding supplement (VITAL AF 1.2 CAL) 1,000 mL (05/21/19 1607)  . fentaNYL infusion INTRAVENOUS 25 mcg/hr (05/22/19 1500)  . phenylephrine (NEO-SYNEPHRINE) Adult infusion 15 mcg/min (05/22/19 1500)  . piperacillin-tazobactam (ZOSYN)  IV Stopped (05/22/19 1433)   IHK:VQQVZD chloride, bisacodyl, fentaNYL, midazolam, midazolam, ondansetron **OR** ondansetron (ZOFRAN) IV, polyethylene glycol   Objective:  Vital Signs  Vitals:   05/22/19 1300 05/22/19 1400 05/22/19 1430 05/22/19 1500  BP: (!) 119/54 (!) 118/57 (!) 109/49 123/60  Pulse: 60 60 60 60  Resp: 14 14 15 14   Temp:   98 F (36.7 C)   TempSrc:   Oral   SpO2: 100%  100% 99% 99%  Weight:      Height:        Intake/Output Summary (Last 24 hours) at 05/22/2019 1513 Last data filed at 05/22/2019 1500 Gross per 24 hour  Intake 6814.83 ml  Output 3095 ml  Net 3719.83 ml   Filed Weights   05/20/19 0500 05/21/19 0500 05/22/19 0444  Weight: 51.6 kg 58.6 kg 62 kg    Sedated, Intubated, in no apparent distress Symmetrical Chest wall movement, Good air movement bilaterally, CTAB RRR,No Gallops,Rubs or new Murmurs, No Parasternal Heave +ve B.Sounds, Abd Soft, No tenderness, No rebound - guarding or rigidity. No Cyanosis, Clubbing or edema, No new Rash or bruise     Lab Results:  Data Reviewed: I have personally reviewed following labs and imaging studies  CBC: Recent Labs  Lab 05/19/19 0430  05/19/19 1700 05/20/19 0115 05/20/19 0546 05/21/19 0500 05/22/19 0500  WBC 52.7*  --  QUESTIONABLE RESULTS, RECOMMEND RECOLLECT TO VERIFY 24.0*  --  17.5* 15.0*  HGB 10.4*   < > QUESTIONABLE RESULTS, RECOMMEND RECOLLECT TO VERIFY 10.2* 9.9* 8.2* 7.5*  HCT 32.8*   < > QUESTIONABLE RESULTS, RECOMMEND RECOLLECT TO VERIFY 31.1* 29.0* 26.0* 23.6*  MCV 94.0  --  QUESTIONABLE RESULTS, RECOMMEND RECOLLECT TO VERIFY 91.7  --  92.9 93.3  PLT 336  --  QUESTIONABLE RESULTS, RECOMMEND RECOLLECT TO VERIFY 275  --  212 181   < > = values in this interval not displayed.    Basic Metabolic Panel: Recent Labs  Lab 05/18/19 1730 05/18/19 1845  05/19/19 0430 05/19/19 1101 05/19/19 1700 05/20/19 0115 05/20/19 0546 05/21/19 0500 05/22/19 0500  NA 158*  --    < > 156* 150*  --  153* 153* 151* 147*  K 3.6  --    < > 3.9 4.2  --  4.2 4.1 4.8 4.6  CL 122*  --   --  117*  --   --  119*  --  119* 118*  CO2 21*  --   --  18*  --   --  23  --  22 18*  GLUCOSE 84  --   --  27*  --   --  203*  --  50* 143*  BUN 96*  --   --  99*  --   --  93*  --  85* 91*  CREATININE 2.75*  --   --  3.25*  --   --  3.18*  --  3.26* 3.14*  CALCIUM 9.0  --   --  8.9  --   --  8.0*  --  8.5*  8.3*  MG 2.5* 2.4  --  1.9  --  2.0 2.1  --  2.3 2.2  PHOS 2.1* 3.2  --  2.8  --  5.2* 5.3*  --   --   --    < > = values in this interval not displayed.    GFR: Estimated Creatinine Clearance: 13.6 mL/min (A) (by C-G formula based on SCr of 3.14 mg/dL (H)).  Liver Function Tests: Recent Labs  Lab 05/18/19 0410 05/19/19 0430 05/20/19 0115 05/21/19 0500 05/22/19 0500  AST 26 34 33 31 42*  ALT 18 21 19 17 20   ALKPHOS 52 55 72 70 69  BILITOT 0.4 0.2* 0.4 0.5 0.3  PROT 6.3* 5.8* 5.0* 4.9* 5.0*  ALBUMIN 2.9* 3.3* 2.7* 2.4* 2.4*    Cardiac Enzymes: No results for input(s): CKTOTAL, CKMB, CKMBINDEX, TROPONINI in the last 168 hours.  CBG: Recent Labs  Lab 05/21/19 1943 05/22/19 0000 05/22/19 0412 05/22/19 0708 05/22/19 1119  GLUCAP 95 157* 143* 88 123*    Anemia Panel: Recent Labs    05/20/19 0115  FERRITIN 215    Recent Results (from the past 240 hour(s))  SARS Coronavirus 2 (CEPHEID - Performed in [place] hospital lab), Hosp Order     Status: Abnormal   Collection Time: 05/14/19  5:20 AM   Specimen: Nasopharyngeal Swab  Result Value Ref Range Status   SARS Coronavirus 2 POSITIVE (A) NEGATIVE Final    Comment: RESULT CALLED TO, READ BACK BY AND VERIFIED WITH: RN C CHRISCO @0630  05/14/19 BY S GEZAHEGN (NOTE) If result is NEGATIVE SARS-CoV-2 target nucleic acids are NOT DETECTED. The SARS-CoV-2 RNA is generally detectable in upper and lower  respiratory specimens during the acute phase of infection. The lowest  concentration of SARS-CoV-2 viral copies this assay can detect is 250  copies / mL. A negative result does not preclude SARS-CoV-2 infection  and should not be used as the sole basis for treatment or other  patient management decisions.  A negative result may occur with  improper specimen collection / handling, submission of specimen other  than nasopharyngeal swab, presence of viral mutation(s) within the  areas targeted by this assay, and inadequate  number of viral copies  (<250 copies / mL). A negative result must be combined with clinical  observations, patient history, and epidemiological information. If result is POSITIVE SARS-CoV-2 target nucleic acids are DETECTED . The SARS-CoV-2 RNA is generally detectable in upper and lower  respiratory specimens during the acute phase of infection.  Positive  results are indicative of active infection with SARS-CoV-2.  Clinical  correlation with patient history and other diagnostic information is  necessary to determine patient infection status.  Positive results do  not rule out bacterial infection or co-infection with other viruses. If result is PRESUMPTIVE POSTIVE SARS-CoV-2 nucleic acids MAY BE PRESENT.   A presumptive positive result was obtained on the submitted specimen  and confirmed on repeat testing.  While 2019 novel coronavirus  (SARS-CoV-2) nucleic acids may be present in the submitted sample  additional confirmatory testing may be necessary for epidemiological  and / or clinical management purposes  to differentiate between  SARS-CoV-2 and other Sarbecovirus currently known to infect humans.  If clinically indicated additional testing with an alternate test  methodology [id number] ) is advised. The SARS-CoV-2 RNA is generally  detectable in  upper and lower respiratory specimens during the acute  phase of infection. The expected result is Negative. Fact Sheet for Patients:  StrictlyIdeas.no Fact Sheet for Healthcare Providers: BankingDealers.co.za This test is not yet approved or cleared by the [place] FDA and has been authorized for detection and/or diagnosis of SARS-CoV-2 by FDA under an Emergency Use Authorization (EUA).  This EUA will remain in effect (meaning this test can be used) for the duration of the COVID-19 declaration under Section 564(b)(1) of the Act, 21 U.S.C. section 360bbb-3(b)(1), unless the  authorization is terminated or revoked sooner. Performed at [facility], [place] [address], [place], [place] [address]   Urine culture     Status: Abnormal   Collection Time: 05/14/19 11:30 AM   Specimen: Urine, Clean Catch  Result Value Ref Range Status   Specimen Description   Final    URINE, CLEAN CATCH Performed at [facility], [place] [address] [place], [place] [address]    Special Requests   Final    NONE Performed at [facility], [place] [address], [place], [place] [address]    Culture (A)  Final    10,000 COLONIES/mL MULTIPLE SPECIES PRESENT, SUGGEST RECOLLECTION   Report Status 05/16/2019 FINAL  Final  MRSA PCR Screening     Status: None   Collection Time: 05/18/19  5:16 AM   Specimen: Nasal Mucosa; Nasopharyngeal  Result Value Ref Range Status   MRSA by PCR NEGATIVE NEGATIVE Final    Comment:        The GeneXpert MRSA Assay (FDA approved for NASAL specimens only), is one component of a comprehensive MRSA colonization surveillance program. It is not intended to diagnose MRSA infection nor to guide or monitor treatment for MRSA infections. Performed at [facility], [place] [address], [place], [place] [address]   Culture, respiratory (non-expectorated)     Status: None (Preliminary result)   Collection Time: 05/22/19  5:00 AM   Specimen: Tracheal Aspirate; Respiratory  Result Value Ref Range Status   Specimen Description   Final    TRACHEAL ASPIRATE Performed at [place] [address] [place], [place] [address]    Special Requests   Final    NONE Performed at [facility], [place] [address] [place], [place] [address]    Gram Stain   Final    MODERATE WBC PRESENT,BOTH PMN AND MONONUCLEAR ABUNDANT GRAM POSITIVE COCCI IN PAIRS IN CLUSTERS MODERATE GRAM VARIABLE ROD Performed at [facility], [place] [address], [place], [place] [address]    Culture PENDING   Incomplete   Report Status PENDING  Incomplete      Radiology Studies: Dg Chest Port 1 View  Result Date: 05/22/2019 CLINICAL DATA:  Endotracheal tube present. EXAM: PORTABLE CHEST 1 VIEW COMPARISON:  Radiograph May 21, 2019. FINDINGS: Endotracheal and nasogastric tubes are unchanged in position. Right internal jugular catheter is unchanged. Left-sided pacemaker is unchanged. Atherosclerosis of thoracic aorta is noted. No pneumothorax is noted. Stable bibasilar opacities are noted concerning for inflammation or atelectasis with small pleural effusions. Bony thorax is unremarkable. IMPRESSION: Stable support apparatus. Stable bibasilar opacities as described above. Electronically Signed   By: Marijo Conception M.D.   On: 05/22/2019 09:36   Dg Chest Port 1 View  Result Date: 05/21/2019 CLINICAL DATA:  Endotracheal tube present. EXAM: PORTABLE CHEST 1 VIEW COMPARISON:  Radiograph May 19, 2019. FINDINGS: Stable cardiomediastinal silhouette. Endotracheal and nasogastric tubes are unchanged in  position. Left-sided pacemaker is unchanged in position. Right internal jugular catheter is unchanged in position. Stable bibasilar opacities are noted which are predominantly interstitial, concerning for edema or possibly inflammation. No significant pleural effusion is noted. Bony thorax is unremarkable. IMPRESSION: Stable support apparatus. Stable bibasilar opacities as described above. Electronically Signed   By: Marijo Conception M.D.   On: 05/21/2019 08:56       LOS: 8 days   Phillips Climes MD  [facility] Pager on [email or web address]  05/22/2019, 3:13 PM

## 2019-05-22 NOTE — Progress Notes (Signed)
Called patient's son and provided an update.

## 2019-05-22 NOTE — Progress Notes (Signed)
Pharmacy Antibiotic Note  Betty [name] is a 73 y.o. female admitted on 05/13/2019 with COVID-19 pneumonia.  Pharmacy has been consulted for Zosyn dosing for sepsis - unable to identify source. Now to add Vancomycin for expanded coverage. New trach asp and urine cx pending. PCT 0.59>6.44> 4.99. Afeb.  Noted pt with good UOP (2.7L yesterday) even with SCr 3.14.  Plan: Vancomycin 1250mg  IV now. Will f/u random level in 48 hrs for further dosing in pt with renal dysfunction Continue Zosyn 2.25g IV q8h Continue to follow up renal function, cultures, clinical condition.  Height: 5\' 4"  (162.6 cm) Weight: 136 lb 11 oz (62 kg) IBW/kg (Calculated) : 54.7  Temp (24hrs), Avg:98 F (36.7 C), Min:97.2 F (36.2 C), Max:98.3 F (36.8 C)  Recent Labs  Lab 05/18/19 1730 05/19/19 0430 05/19/19 0935 05/19/19 1700 05/20/19 0115 05/21/19 0500 05/22/19 0500  WBC  --  52.7*  --  QUESTIONABLE RESULTS, RECOMMEND RECOLLECT TO VERIFY 24.0* 17.5* 15.0*  CREATININE 2.75* 3.25*  --   --  3.18* 3.26* 3.14*  LATICACIDVEN  --   --  4.0* 3.7*  --   --  1.4    Estimated Creatinine Clearance: 13.6 mL/min (A) (by C-G formula based on SCr of 3.14 mg/dL (H)).    Allergies  Allergen Reactions  . Sulfa Antibiotics Itching    Antimicrobials this admission: 7/9 Remdesivir >>7/13 7/10 Actemra x1 7/14 Zosyn >>  7/17 Vanc>>  Microbiology results: 7/9 SARS Coronavirus 2: positive 7/9 UCx: 10k colonies, multiple species 7/13 MRSA PCR: negative 7/17 UCx: sent 7/17 TA:   Thank you for allowing pharmacy to be a part of this patient's care.  Sherlon Handing, PharmD, BCPS Clinical pharmacist phone 7am- 5pm: [phone] 05/22/2019 11:01 AM

## 2019-05-22 NOTE — Progress Notes (Signed)
Precedex drip titrated up because patient gagging and coughing against ET tube. Following commands.

## 2019-05-22 NOTE — Progress Notes (Signed)
Subjective:  pressors seem to be decreasing- UOP  2600 last 24 hours - renal function numbers steady  bicarb stable as well as sodium improved   Objective Vital signs in last 24 hours: Vitals:   05/22/19 1136 05/22/19 1145 05/22/19 1200 05/22/19 1215  BP: (!) 102/45 (!) 110/47 (!) 105/47 (!) 109/49  Pulse: 62 63 62 61  Resp: 14 14 15 14   Temp:  98 F (36.7 C)  98 F (36.7 C)  TempSrc:  Oral  Oral  SpO2: 99% 99% 99% 99%  Weight:      Height:       Weight change: 3.4 kg  Intake/Output Summary (Last 24 hours) at 05/22/2019 1305 Last data filed at 05/22/2019 1200 Gross per 24 hour  Intake 5750.94 ml  Output 2810 ml  Net 2940.94 ml   Assessment/Plan: 73 year old BF with multiple medical issues including COPD, history of lung CA and CKD.  Now presenting with COVID and complications including vent dependence and recent hemodynamic instability 1.Renal- A on CRF- baseline crt seems to be in the mid 2's. Worsened with clinical events this hospitalization. At this time is nonoliguric and I see no needs for RRT.  I will continue to follow closely however.   2. Hypertension/volume  - acute hypotension that seems to be better.  The high sodium indicates free water deficit.  At last check sodium better- continue free water at current dose  3. Elytes- K, phos and Mag all OK  -  not concerning 4. Anemia  - was reasonable- did drop into the 8's yest, 7's today.  Supportive care- per primary and CCM  5.  Acid/base- serum bicard 18- last pH over 7.4-  No acute need for action     Betty Betty [name]    Labs: Basic Metabolic Panel: Recent Labs  Lab 05/19/19 0430  05/19/19 1700 05/20/19 0115 05/20/19 0546 05/21/19 0500 05/22/19 0500  NA 156*   < >  --  153* 153* 151* 147*  K 3.9   < >  --  4.2 4.1 4.8 4.6  CL 117*  --   --  119*  --  119* 118*  CO2 18*  --   --  23  --  22 18*  GLUCOSE 27*  --   --  203*  --  50* 143*  BUN 99*  --   --  93*  --  85* 91*  CREATININE 3.25*  --   --   3.18*  --  3.26* 3.14*  CALCIUM 8.9  --   --  8.0*  --  8.5* 8.3*  PHOS 2.8  --  5.2* 5.3*  --   --   --    < > = values in this interval not displayed.   Liver Function Tests: Recent Labs  Lab 05/20/19 0115 05/21/19 0500 05/22/19 0500  AST 33 31 42*  ALT 19 17 20   ALKPHOS 72 70 69  BILITOT 0.4 0.5 0.3  PROT 5.0* 4.9* 5.0*  ALBUMIN 2.7* 2.4* 2.4*   No results for input(s): LIPASE, AMYLASE in the last 168 hours. No results for input(s): AMMONIA in the last 168 hours. CBC: Recent Labs  Lab 05/19/19 0430  05/19/19 1700 05/20/19 0115 05/20/19 0546 05/21/19 0500 05/22/19 0500  WBC 52.7*  --  QUESTIONABLE RESULTS, RECOMMEND RECOLLECT TO VERIFY 24.0*  --  17.5* 15.0*  HGB 10.4*   < > QUESTIONABLE RESULTS, RECOMMEND RECOLLECT TO VERIFY 10.2* 9.9* 8.2* 7.5*  HCT 32.8*   < >  QUESTIONABLE RESULTS, RECOMMEND RECOLLECT TO VERIFY 31.1* 29.0* 26.0* 23.6*  MCV 94.0  --  QUESTIONABLE RESULTS, RECOMMEND RECOLLECT TO VERIFY 91.7  --  92.9 93.3  PLT 336  --  QUESTIONABLE RESULTS, RECOMMEND RECOLLECT TO VERIFY 275  --  212 181   < > = values in this interval not displayed.   Cardiac Enzymes: No results for input(s): CKTOTAL, CKMB, CKMBINDEX, TROPONINI in the last 168 hours. CBG: Recent Labs  Lab 05/21/19 1943 05/22/19 0000 05/22/19 0412 05/22/19 0708 05/22/19 1119  GLUCAP 95 157* 143* 88 123*    Iron Studies:  Recent Labs    05/20/19 0115  FERRITIN 215   Studies/Results: Dg Chest Port 1 View  Result Date: 05/22/2019 CLINICAL DATA:  Endotracheal tube present. EXAM: PORTABLE CHEST 1 VIEW COMPARISON:  Radiograph May 21, 2019. FINDINGS: Endotracheal and nasogastric tubes are unchanged in position. Right internal jugular catheter is unchanged. Left-sided pacemaker is unchanged. Atherosclerosis of thoracic aorta is noted. No pneumothorax is noted. Stable bibasilar opacities are noted concerning for inflammation or atelectasis with small pleural effusions. Bony thorax is unremarkable.  IMPRESSION: Stable support apparatus. Stable bibasilar opacities as described above. Electronically Signed   By: Marijo Conception M.D.   On: 05/22/2019 09:36   Dg Chest Port 1 View  Result Date: 05/21/2019 CLINICAL DATA:  Endotracheal tube present. EXAM: PORTABLE CHEST 1 VIEW COMPARISON:  Radiograph May 19, 2019. FINDINGS: Stable cardiomediastinal silhouette. Endotracheal and nasogastric tubes are unchanged in position. Left-sided pacemaker is unchanged in position. Right internal jugular catheter is unchanged in position. Stable bibasilar opacities are noted which are predominantly interstitial, concerning for edema or possibly inflammation. No significant pleural effusion is noted. Bony thorax is unremarkable. IMPRESSION: Stable support apparatus. Stable bibasilar opacities as described above. Electronically Signed   By: Marijo Conception M.D.   On: 05/21/2019 08:56   Medications: Infusions: . sodium chloride Stopped (05/22/19 1132)  . dexmedetomidine (PRECEDEX) IV infusion 0.5 mcg/kg/hr (05/22/19 1200)  . dextrose Stopped (05/20/19 1431)  . feeding supplement (VITAL AF 1.2 CAL) 1,000 mL (05/21/19 1607)  . fentaNYL infusion INTRAVENOUS 50 mcg/hr (05/22/19 1200)  . phenylephrine (NEO-SYNEPHRINE) Adult infusion 25 mcg/min (05/22/19 1200)  . piperacillin-tazobactam (ZOSYN)  IV Stopped (05/22/19 4268)    Scheduled Medications: . sodium chloride   Intravenous Once  . chlorhexidine  15 mL Mouth/Throat BID  . Chlorhexidine Gluconate Cloth  6 each Topical Daily  . docusate  100 mg Oral BID  . feeding supplement (PRO-STAT SUGAR FREE 64)  30 mL Per Tube BID  . free water  400 mL Per Tube Q3H  . insulin aspart  0-20 Units Subcutaneous Q4H  . insulin aspart  6 Units Subcutaneous Q4H  . insulin detemir  15 Units Subcutaneous BID  . ipratropium-albuterol  3 mL Nebulization Q6H  . mouth rinse  15 mL Mouth Rinse 10 times per day  . methylPREDNISolone (SOLU-MEDROL) injection  20 mg Intravenous Q24H  .  pantoprazole (PROTONIX) IV  40 mg Intravenous Q12H  . QUEtiapine  50 mg Oral BID  . sodium chloride flush  3 mL Intravenous Q12H  . vancomycin variable dose per unstable renal function (pharmacist dosing)   Does not apply See admin instructions    have reviewed scheduled and prn medications.  Physical Exam: As patient is on full COVID precautions a full exam was not done in order to preserve PPE and to limit exposure to other patients and providers.  [facility] of service was the [facility]  05/22/2019,1:05 PM  LOS: 8 days

## 2019-05-22 NOTE — Progress Notes (Signed)
RT NOTE:  Sputum specimen collected and sent to lab

## 2019-05-22 NOTE — Progress Notes (Addendum)
Pharmacy Antibiotic Note  Betty [name] is a 73 y.o. female admitted on 05/13/2019 with COVID-19 pneumonia.  Pharmacy has been consulted for Zosyn dosing for sepsis - unable to identify source. Now to add Vancomycin for expanded coverage. New trach asp and urine cx pending. PCT 0.59>6.44> 4.99. Afeb.  Plan: Vancomycin 1250mg  IV now. Will f/u random level in 48-72 hrs for further dosing in pt with renal dysfunction Continue Zosyn 2.25g IV q8h Continue to follow up renal function, cultures, clinical condition.  Height: 5\' 4"  (162.6 cm) Weight: 136 lb 11 oz (62 kg) IBW/kg (Calculated) : 54.7  Temp (24hrs), Avg:98 F (36.7 C), Min:97.2 F (36.2 C), Max:98.3 F (36.8 C)  Recent Labs  Lab 05/18/19 1730 05/19/19 0430 05/19/19 0935 05/19/19 1700 05/20/19 0115 05/21/19 0500 05/22/19 0500  WBC  --  52.7*  --  QUESTIONABLE RESULTS, RECOMMEND RECOLLECT TO VERIFY 24.0* 17.5* 15.0*  CREATININE 2.75* 3.25*  --   --  3.18* 3.26* 3.14*  LATICACIDVEN  --   --  4.0* 3.7*  --   --  1.4    Estimated Creatinine Clearance: 13.6 mL/min (A) (by C-G formula based on SCr of 3.14 mg/dL (H)).    Allergies  Allergen Reactions  . Sulfa Antibiotics Itching    Antimicrobials this admission: 7/9 Remdesivir >>7/13 7/10 Actemra x1 7/14 Zosyn >>  7/17 Vanc>>  Microbiology results: 7/9 SARS Coronavirus 2: positive 7/9 UCx: 10k colonies, multiple species 7/13 MRSA PCR: negative 7/17 UCx: sent 7/17 TA:   Thank you for allowing pharmacy to be a part of this patient's care.  Sherlon Handing, PharmD, BCPS Clinical pharmacist phone 7am- 5pm: [phone] 05/22/2019 9:55 AM

## 2019-05-22 NOTE — Progress Notes (Signed)
NAME:  Betty [name], MRN:  [id number], DOB:  19-Sep-1946, LOS: 8 ADMISSION DATE:  05/13/2019, CONSULTATION DATE: July 10 REFERRING MD: Cruzita Lederer, CHIEF COMPLAINT: Dyspnea  Brief History   73 year old female with multiple medical problems admitted on July 9 with worsening dyspnea in setting of severe acute respiratory failure with hypoxemia due to COVID-19 pneumonia.  Past Medical History  COPD NSCLC 5284 CKD Systolic heart failure Hypertension  Significant Hospital Events   7/9 admission 7/10 transfer to ICU July 11 attempted to self extubate July 13 extubated, reintubated later in the day due to encephalopathy, developed hypotension, A. fib with RVR July 14, remains hypotensive on vasopressors, increased NG output. This morning had increased NG output, and shock, requiring Levophed, lactic acid elevated at 4   May 20, 2019: Remains on ventilator with FiO2 40% and PEEP of 5.  Pulse ox 91%.  Tube feed is on hold because of vomiting.  Is on amnio drip.  Is also on Precedex.  Has wheezing on exam.  7/16- remains on vent with 40% fio2, peep 5. Did SBT but on on fent gtt,. Also on neo gtt and amio gtt. Being paced. Hypernatremic with rising Na . PCT up at 6. FEbrile over night 101.6 F overnight  Failed fent wean with tachypnea  Consults:  PCCM  Procedures:  7/10 ETT > July 14 extubated, re-intubated >  7/10 R IJ CVL >   Significant Diagnostic Tests:  04/2019 LVEF 30-35%, LVH, LV hypokinesis, RV normal function, minimal valve insufficiencies  Micro Data:  7/9 SARS COV2 >  7/9 Urine culture >   Antimicrobials:  7/9 remdesivir > 7/13 7/9 solumedrol >  7/13  7/14 zosyn >       Interim history/subjective:    05/22/2019 - remains on vent. Off amio x  1 day -> HR 61 and paced. Had L GIB yesterday. But coming down on pressor this AM - neo at 30. Hgb 7.5gm% and down by 1gm%. Fails SBT - she is diffusely weak and exhausted - > gets tachypneic. High Na/AKi better with increased free  water. Currently on fent gtt, precedex gtt and neo gtt  Objective   Blood pressure (!) 109/49, pulse 61, temperature 98 F (36.7 C), temperature source Oral, resp. rate 14, height 5\' 4"  (1.626 m), weight 62 kg, SpO2 99 %. CVP:  [4 mmHg-12 mmHg] 6 mmHg  Vent Mode: PSV;CPAP FiO2 (%):  [40 %] 40 % Set Rate:  [24 bmp] 24 bmp PEEP:  [5 cmH20] 5 cmH20 Pressure Support:  [5 cmH20-8 cmH20] 8 cmH20   Intake/Output Summary (Last 24 hours) at 05/22/2019 1246 Last data filed at 05/22/2019 1200 Gross per 24 hour  Intake 5793.2 ml  Output 2810 ml  Net 2983.2 ml   Filed Weights   05/20/19 0500 05/21/19 0500 05/22/19 0444  Weight: 51.6 kg 58.6 kg 62 kg  ,3    LABS    PULMONARY Recent Labs  Lab 05/15/19 1839 05/15/19 2049 05/18/19 2022 05/19/19 1101 05/20/19 0546  PHART 7.538* 7.440 7.309*  --  7.409  PCO2ART 21.6* 35.0 42.5  --  30.6*  PO2ART 45.0* 447.0* 62.0*  --  63.0*  HCO3 18.5* 23.7 21.3 19.0* 19.5*  TCO2 19* 25 23 20* 20*  O2SAT 89.0 100.0 89.0 75.0 93.0    CBC Recent Labs  Lab 05/20/19 0115 05/20/19 0546 05/21/19 0500 05/22/19 0500  HGB 10.2* 9.9* 8.2* 7.5*  HCT 31.1* 29.0* 26.0* 23.6*  WBC 24.0*  --  17.5* 15.0*  PLT 275  --  212 181    COAGULATION No results for input(s): INR in the last 168 hours.  CARDIAC  No results for input(s): TROPONINI in the last 168 hours. No results for input(s): PROBNP in the last 168 hours.   CHEMISTRY Recent Labs  Lab 05/18/19 1730 05/18/19 1845  05/19/19 0430 05/19/19 1101 05/19/19 1700 05/20/19 0115 05/20/19 0546 05/21/19 0500 05/22/19 0500  NA 158*  --    < > 156* 150*  --  153* 153* 151* 147*  K 3.6  --    < > 3.9 4.2  --  4.2 4.1 4.8 4.6  CL 122*  --   --  117*  --   --  119*  --  119* 118*  CO2 21*  --   --  18*  --   --  23  --  22 18*  GLUCOSE 84  --   --  27*  --   --  203*  --  50* 143*  BUN 96*  --   --  99*  --   --  93*  --  85* 91*  CREATININE 2.75*  --   --  3.25*  --   --  3.18*  --  3.26* 3.14*   CALCIUM 9.0  --   --  8.9  --   --  8.0*  --  8.5* 8.3*  MG 2.5* 2.4  --  1.9  --  2.0 2.1  --  2.3 2.2  PHOS 2.1* 3.2  --  2.8  --  5.2* 5.3*  --   --   --    < > = values in this interval not displayed.   Estimated Creatinine Clearance: 13.6 mL/min (A) (by C-G formula based on SCr of 3.14 mg/dL (H)).   LIVER Recent Labs  Lab 05/18/19 0410 05/19/19 0430 05/20/19 0115 05/21/19 0500 05/22/19 0500  AST 26 34 33 31 42*  ALT 18 21 19 17 20   ALKPHOS 52 55 72 70 69  BILITOT 0.4 0.2* 0.4 0.5 0.3  PROT 6.3* 5.8* 5.0* 4.9* 5.0*  ALBUMIN 2.9* 3.3* 2.7* 2.4* 2.4*     INFECTIOUS Recent Labs  Lab 05/19/19 0935 05/19/19 1700 05/21/19 0500 05/22/19 0500  LATICACIDVEN 4.0* 3.7*  --  1.4  PROCALCITON  --   --  6.44 4.99     ENDOCRINE CBG (last 3)  Recent Labs    05/22/19 0412 05/22/19 0708 05/22/19 1119  GLUCAP 143* 88 123*         IMAGING x48h  - image(s) personally visualized  -   highlighted in bold Dg Chest Port 1 View  Result Date: 05/22/2019 CLINICAL DATA:  Endotracheal tube present. EXAM: PORTABLE CHEST 1 VIEW COMPARISON:  Radiograph May 21, 2019. FINDINGS: Endotracheal and nasogastric tubes are unchanged in position. Right internal jugular catheter is unchanged. Left-sided pacemaker is unchanged. Atherosclerosis of thoracic aorta is noted. No pneumothorax is noted. Stable bibasilar opacities are noted concerning for inflammation or atelectasis with small pleural effusions. Bony thorax is unremarkable. IMPRESSION: Stable support apparatus. Stable bibasilar opacities as described above. Electronically Signed   By: Marijo Conception M.D.   On: 05/22/2019 09:36   Dg Chest Port 1 View  Result Date: 05/21/2019 CLINICAL DATA:  Endotracheal tube present. EXAM: PORTABLE CHEST 1 VIEW COMPARISON:  Radiograph May 19, 2019. FINDINGS: Stable cardiomediastinal silhouette. Endotracheal and nasogastric tubes are unchanged in position. Left-sided pacemaker is unchanged in position.  Right internal jugular catheter  is unchanged in position. Stable bibasilar opacities are noted which are predominantly interstitial, concerning for edema or possibly inflammation. No significant pleural effusion is noted. Bony thorax is unremarkable. IMPRESSION: Stable support apparatus. Stable bibasilar opacities as described above. Electronically Signed   By: Marijo Conception M.D.   On: 05/21/2019 08:56     Resolved Hospital Problem list     Assessment & Plan:  Acute respiratory failure with hypoxemia: At this point mostly due to encephalopathy and inability to protect airway  suspect COPD contributing  05/22/2019 - > does not meet criteria for SBT/Extubation in setting of Acute Respiratory Failure due to encpephloapthy and physical deconditiioning and shock  PLAN  - full vent support Ventilator associated pneumonia prevention protocol Combivent scheduled    Encephalopathy: Multifactorial: July 14 in part due to sepsis and shock, renal failure and baseline dementia (on aricept)  7/17- this continues with obtundation  PLAN Continue precedex gtt Continue fent gtt Continue seroquel Dc klonopin Dc oxycidone prn Do versed prn Dc aricpet   Shock: Septic, co-ox not consistent with cardiogenic shock  05/22/2019 - improving  PLAN Neo for MAP > 65  Severe sepsis  - febrile 7/15  with high PCT  Plan  - continue zosyn - await sputum and urine culture from 7/16   COPD Combivent q6h  Congestive heart failure: Venous O2 saturation and physical exam not consistent with cardiogenic shock on 7/14 Hold bidil  A Fib -  - on amio for several days but beoing paced 05/21/2019 and amio stopped  7/17 - being paced  plan -monitor off admio  CKD> now with non-oliguric failure  05/22/2019  -improved after increasing free water Plan  -  free water   Concern for LGI bleed  Plan   - per triad  Anemia critical illness and LGI bleed  - .- PRBC for hgb </= 6.9gm%    -  exceptions are   -  if ACS susepcted/confirmed then transfuse for hgb </= 8.0gm%,  or    -  active bleeding with hemodynamic instability, then transfuse regardless of hemoglobin value   At at all times try to transfuse 1 unit prbc as possible with exception of active hemorrhage    Goals of care: DNR if arrests, full support otherwise   Best practice:  Diet: tube feeding Pain/Anxiety/Delirium protocol (if indicated): n/a VAP protocol (if indicated): n/a DVT prophylaxis: sub q heparin GI prophylaxis: Pantoprazole for stress ulcer prophylaxis Glucose control: SSI per TRH Mobility: bed rest, range of motion exercises OK Code Status: DNR  Family Communication: per TRH Disposition: icu     ATTESTATION & SIGNATURE   The patient CRYSTEL DEMARCO is critically ill with multiple organ systems failure and requires high complexity decision making for assessment and support, frequent evaluation and titration of therapies, application of advanced monitoring technologies and extensive interpretation of multiple databases.   Critical Care Time devoted to patient care services described in this note is  30  Minutes. This time reflects time of care of this signee Dr Brand Males. This critical care time does not reflect procedure time, or teaching time or supervisory time of PA/NP/Med student/Med Resident etc but could involve care discussion time     Dr. Brand Males, M.D., [facility].C.P Pulmonary and Critical Care Medicine Staff Physician [facility] Pager: [phone], If no answer or between  15:00h - 7:00h: call [phone]  05/22/2019 12:46 PM

## 2019-05-23 LAB — MAGNESIUM: Magnesium: 2.1 mg/dL (ref 1.7–2.4)

## 2019-05-23 LAB — GLUCOSE, CAPILLARY
Glucose-Capillary: 94 mg/dL (ref 70–99)
Glucose-Capillary: 98 mg/dL (ref 70–99)
Glucose-Capillary: 144 mg/dL — ABNORMAL HIGH (ref 70–99)
Glucose-Capillary: 144 mg/dL — ABNORMAL HIGH (ref 70–99)
Glucose-Capillary: 144 mg/dL — ABNORMAL HIGH (ref 70–99)
Glucose-Capillary: 90 mg/dL (ref 70–99)

## 2019-05-23 LAB — COMPREHENSIVE METABOLIC PANEL
ALT: 21 U/L (ref 0–44)
AST: 37 U/L (ref 15–41)
Albumin: 2.4 g/dL — ABNORMAL LOW (ref 3.5–5.0)
Alkaline Phosphatase: 57 U/L (ref 38–126)
Anion gap: 9 (ref 5–15)
BUN: 94 mg/dL — ABNORMAL HIGH (ref 8–23)
CO2: 20 mmol/L — ABNORMAL LOW (ref 22–32)
Calcium: 8.6 mg/dL — ABNORMAL LOW (ref 8.9–10.3)
Chloride: 115 mmol/L — ABNORMAL HIGH (ref 98–111)
Creatinine, Ser: 2.74 mg/dL — ABNORMAL HIGH (ref 0.44–1.00)
GFR calc Af Amer: 19 mL/min — ABNORMAL LOW (ref >60)
GFR calc non Af Amer: 16 mL/min — ABNORMAL LOW (ref >60)
Glucose, Bld: 116 mg/dL — ABNORMAL HIGH (ref 70–99)
Potassium: 4.7 mmol/L (ref 3.5–5.1)
Sodium: 144 mmol/L (ref 135–145)
Total Bilirubin: 0.5 mg/dL (ref 0.3–1.2)
Total Protein: 5.1 g/dL — ABNORMAL LOW (ref 6.5–8.1)

## 2019-05-23 LAB — TYPE AND SCREEN
ABO/RH(D): A POS
Antibody Screen: NEGATIVE
Unit division: 0

## 2019-05-23 LAB — BPAM RBC
Blood Product Expiration Date: 202008132359
ISSUE DATE / TIME: 202007171135
Unit Type and Rh: 6200

## 2019-05-23 LAB — PROCALCITONIN: Procalcitonin: 3.04 ng/mL

## 2019-05-23 LAB — CBC
HCT: 28.7 % — ABNORMAL LOW (ref 36.0–46.0)
Hemoglobin: 9.3 g/dL — ABNORMAL LOW (ref 12.0–15.0)
MCH: 29.9 pg (ref 26.0–34.0)
MCHC: 32.4 g/dL (ref 30.0–36.0)
MCV: 92.3 fL (ref 80.0–100.0)
Platelets: 153 10*3/uL (ref 150–400)
RBC: 3.11 MIL/uL — ABNORMAL LOW (ref 3.87–5.11)
RDW: 14.1 % (ref 11.5–15.5)
WBC: 11.3 10*3/uL — ABNORMAL HIGH (ref 4.0–10.5)
nRBC: 0.3 % — ABNORMAL HIGH (ref 0.0–0.2)

## 2019-05-23 LAB — URINE CULTURE: Culture: NO GROWTH

## 2019-05-23 LAB — D-DIMER, QUANTITATIVE: D-Dimer, Quant: 3.16 ug/mL-FEU — ABNORMAL HIGH (ref 0.00–0.50)

## 2019-05-23 LAB — C-REACTIVE PROTEIN: CRP: 4.1 mg/dL — ABNORMAL HIGH (ref <1.0)

## 2019-05-23 MED ORDER — HEPARIN SODIUM (PORCINE) 5000 UNIT/ML IJ SOLN
5000.0000 [IU] | Freq: Three times a day (TID) | INTRAMUSCULAR | Status: DC
  Administered 2019-05-23 – 2019-05-24 (×3): 5000 [IU] via SUBCUTANEOUS
  Filled 2019-05-23 (×3): qty 1

## 2019-05-23 NOTE — Progress Notes (Signed)
PROGRESS NOTE  Betty [name] ZOX:[id number] DOB: July 10, 1946 DOA: 05/13/2019  PCP: Josetta Huddle, MD  Brief History/Interval Summary:   - 73 year old female with history of coronary artery disease, non-small cell lung cancer in 2016, hypertension, hyperlipidemia, dementia, COPD, chronic kidney disease stage IV, chronic combined CHF who was brought to the hospital by the patient's son due to increased confusion and weakness.  She was diagnosed with coronavirus in the ED on 7/9.  She was also found to be hypoxic requiring 2 L nasal cannula.  She was admitted to the hospital and placed on steroids along with Remdesivir.  Subjective/Interval History:   He is currently on spontaneous breathing trials, remains on fentanyl and Precedex drip, off Neo-Synephrine, per staff no significant events overnight.  Assessment/Plan:  Acute Hypoxic Resp. Failure due to Acute Covid 19 Viral Illness  Vent Mode: PCV FiO2 (%):  [40 %] 40 % Set Rate:  [24 bmp] 24 bmp PEEP:  [5 cmH20] 5 cmH20 Pressure Support:  [8 cmH20] 8 cmH20     Component Value Date/Time   PHART 7.409 05/20/2019 0546   PCO2ART 30.6 (L) 05/20/2019 0546   PO2ART 63.0 (L) 05/20/2019 0546   HCO3 19.5 (L) 05/20/2019 0546   TCO2 20 (L) 05/20/2019 0546   ACIDBASEDEF 5.0 (H) 05/20/2019 0546   O2SAT 93.0 05/20/2019 0546    COVID-19 Labs  Recent Labs    05/21/19 0500 05/22/19 0500 05/23/19 0500  DDIMER 2.16* 1.68* 3.16*  CRP 14.1* 7.5* 4.1*    Lab Results  Component Value Date   SARSCOV2NAA POSITIVE (A) 05/14/2019      Antibiotics: Started on Zosyn 7/14.  Vancomycin started 7/17 Remdesivir: Completed course of Remdesivir Steroids: Solu-Medrol  Diuretics: Given Lasix x1 on 7/10.  Not continued due to increase in creatinine Actemra: Actemra x1 on 7/10 Convalescent Plasma: Not given yet Vitamin C and Zinc: Continue DVT Prophylaxis: Resume on subcu heparin.  Patient still requiring mechanical ventilation, seemingly frail, with  paralysis of breathing and is on breathing trials, continue with the pressure support as tolerated, vent management per PCCM . -As discussed with PCCM, appears extremely weak, unlikely to be extubated anytime soon .  Sepsis -No significant leukocytosis, but with elevated procalcitonin, so far source could not be identified, she is empirically on Zosyn and vancomycin, sputum culture growing gram-positive cocci. -Today she is of Neo-Synephrine   Shock Likely a combination of septic as well as cardiogenic shock.  She does have cool extremities.  Venous blood gas shows saturation of 75.    Atrial fibrillation with RVR She was on amiodarone drip, currently rate controlled, paced rhythm, . -No anticoagulation currently in the setting of lower GI bleed .  Lower GI bleed -Patient with blood in her stools, gastric lavage with no evidence of blood, she required 1 unit PRBC transfusion 7/17 with good response, hemoglobin is 9.5 . -Continue to hold full anticoagulation, but I will start her on DVT prophylaxis today .  Hypernatremia Improving on free water via OGT   Chronic systolic CHF Most recent echocardiogram done in June 2020 showed a EF of 30 to 35%.  Chest x-ray did raise concern for pulmonary edema.  However patient is currently hypotensive.  Acute on chronic kidney disease stage IV Baseline creatinine around 2.0-3.0.  Patient with worsening renal function, currently has stabilized, remains with good urine output, no indication for CRRT per renal .  Hyperglycemia due to steroids Elevated CBGs are due to steroids.  She has been on multiple courses  of prednisone in the last 1 to 2 months for acute gout which is the likely reason for her hyperglycemia.  And also likely reason for her elevated HbA1c.  Continue long-acting insulin and SSI for now.  CBG should improve as steroid is tapered down.     History of dementia/acute metabolic encephalopathy Appears to be mild dementia.  Patient is able  to live independently.  Apparently has poor short-term memory.  Continue to monitor.  Patient has encephalopathy most likely due to acute illness and medications.  Continue to monitor.  No obvious deficits noted.  History of non-small cell lung cancer and COPD She is status post right lower lobe lobectomy and has completed 4 cycles of adjuvant chemotherapy.  Lung cancer appears to be in remission.    History of gout Initially it was thought that patient is on chronic prednisone at home but review of previous notes did not mention prednisone.  Discussed on steroids with patient's son.  Apparently over the last 1 to 2 months she has had issues with acute gout for which she was prescribed courses of steroids.  She does not take steroid on a regular basis.  However she was on a course of prednisone at the time of admission.  Nutrition Speech therapy to evaluate tube feedings on hold currently  Goals of care Patient appears to have taken a turn for the worse. Previous MD discussed in detail with patient's son.  He is agreeable to DNR currently.  He does want to pursue CRRT and dialysis if that is an option.  He understands that patient is getting worse.  He understands that she may not survive this hospitalization.  DVT Prophylaxis: Heparin changed over to SCDs PUD Prophylaxis: Protonix Code Status: DNR Family Communication: discuss with son on daily basis. Disposition Plan: Will remain in ICU  Reason for Visit: Acute respiratory disease due to COVID-19  Consultants: Pulmonology  Procedures:  Intubation 7/10 Central line placement right IJ 7/10  Antibiotics: Anti-infectives (From admission, onward)   Start     Dose/Rate Route Frequency Ordered Stop   05/19/19 1030  piperacillin-tazobactam (ZOSYN) IVPB 2.25 g     2.25 g 100 mL/hr over 30 Minutes Intravenous Every 8 hours 05/19/19 1023     05/15/19 1230  remdesivir 100 mg in sodium chloride 0.9 % 250 mL IVPB     100 mg 500 mL/hr over 30  Minutes Intravenous Every 24 hours 05/14/19 1148 05/18/19 1340   05/14/19 1230  remdesivir 200 mg in sodium chloride 0.9 % 250 mL IVPB     200 mg 500 mL/hr over 30 Minutes Intravenous Once 05/14/19 1148 05/14/19 1551        Medications:  Scheduled: . chlorhexidine  15 mL Mouth/Throat BID  . Chlorhexidine Gluconate Cloth  6 each Topical Daily  . docusate  100 mg Oral BID  . feeding supplement (PRO-STAT SUGAR FREE 64)  30 mL Per Tube BID  . free water  400 mL Per Tube Q3H  . insulin aspart  0-20 Units Subcutaneous Q4H  . insulin aspart  6 Units Subcutaneous Q4H  . insulin detemir  15 Units Subcutaneous BID  . ipratropium-albuterol  3 mL Nebulization Q6H  . mouth rinse  15 mL Mouth Rinse 10 times per day  . methylPREDNISolone (SOLU-MEDROL) injection  20 mg Intravenous Q24H  . pantoprazole (PROTONIX) IV  40 mg Intravenous Q12H  . QUEtiapine  50 mg Oral BID  . sodium chloride flush  3 mL Intravenous Q12H  .  vancomycin variable dose per unstable renal function (pharmacist dosing)   Does not apply See admin instructions   Continuous: . sodium chloride Stopped (05/23/19 0622)  . dexmedetomidine (PRECEDEX) IV infusion 0.5 mcg/kg/hr (05/23/19 1350)  . dextrose Stopped (05/20/19 1431)  . feeding supplement (VITAL AF 1.2 CAL) 1,000 mL (05/22/19 1530)  . fentaNYL infusion INTRAVENOUS 25 mcg/hr (05/23/19 1103)  . piperacillin-tazobactam (ZOSYN)  IV 2.25 g (05/23/19 0622)   MWU:XLKGMW chloride, bisacodyl, fentaNYL, midazolam, midazolam, ondansetron **OR** ondansetron (ZOFRAN) IV, polyethylene glycol   Objective:  Vital Signs  Vitals:   05/23/19 0800 05/23/19 0824 05/23/19 1000 05/23/19 1200  BP: 110/62  119/67 (!) 192/87  Pulse: 60  85 (!) 117  Resp: (!) 23  19 (!) 37  Temp: (!) 96.2 F (35.7 C)  97.6 F (36.4 C) 97.6 F (36.4 C)  TempSrc: Oral  Oral Oral  SpO2: 98% 96% 99% 97%  Weight:      Height:        Intake/Output Summary (Last 24 hours) at 05/23/2019 1404 Last data  filed at 05/23/2019 1208 Gross per 24 hour  Intake 3210.77 ml  Output 3610 ml  Net -399.23 ml   Filed Weights   05/21/19 0500 05/22/19 0444 05/23/19 0455  Weight: 58.6 kg 62 kg 63.2 kg    Sedated, Intubated, in no apparent distress Symmetrical Chest wall movement, Good air movement bilaterally, CTAB RRR,No Gallops,Rubs or new Murmurs, No Parasternal Heave +ve B.Sounds, Abd Soft, No tenderness, No rebound - guarding or rigidity. No Cyanosis, Clubbing or edema, No new Rash or bruise      Lab Results:  Data Reviewed: I have personally reviewed following labs and imaging studies  CBC: Recent Labs  Lab 05/19/19 1700 05/20/19 0115 05/20/19 0546 05/21/19 0500 05/22/19 0500 05/23/19 0500  WBC QUESTIONABLE RESULTS, RECOMMEND RECOLLECT TO VERIFY 24.0*  --  17.5* 15.0* 11.3*  HGB QUESTIONABLE RESULTS, RECOMMEND RECOLLECT TO VERIFY 10.2* 9.9* 8.2* 7.5* 9.3*  HCT QUESTIONABLE RESULTS, RECOMMEND RECOLLECT TO VERIFY 31.1* 29.0* 26.0* 23.6* 28.7*  MCV QUESTIONABLE RESULTS, RECOMMEND RECOLLECT TO VERIFY 91.7  --  92.9 93.3 92.3  PLT QUESTIONABLE RESULTS, RECOMMEND RECOLLECT TO VERIFY 275  --  212 181 102    Basic Metabolic Panel: Recent Labs  Lab 05/18/19 1730 05/18/19 1845  05/19/19 0430  05/19/19 1700 05/20/19 0115 05/20/19 0546 05/21/19 0500 05/22/19 0500 05/23/19 0500  NA 158*  --    < > 156*   < >  --  153* 153* 151* 147* 144  K 3.6  --    < > 3.9   < >  --  4.2 4.1 4.8 4.6 4.7  CL 122*  --   --  117*  --   --  119*  --  119* 118* 115*  CO2 21*  --   --  18*  --   --  23  --  22 18* 20*  GLUCOSE 84  --   --  27*  --   --  203*  --  50* 143* 116*  BUN 96*  --   --  99*  --   --  93*  --  85* 91* 94*  CREATININE 2.75*  --   --  3.25*  --   --  3.18*  --  3.26* 3.14* 2.74*  CALCIUM 9.0  --   --  8.9  --   --  8.0*  --  8.5* 8.3* 8.6*  MG 2.5* 2.4  --  1.9  --  2.0 2.1  --  2.3 2.2 2.1  PHOS 2.1* 3.2  --  2.8  --  5.2* 5.3*  --   --   --   --    < > = values in this  interval not displayed.    GFR: Estimated Creatinine Clearance: 15.6 mL/min (A) (by C-G formula based on SCr of 2.74 mg/dL (H)).  Liver Function Tests: Recent Labs  Lab 05/19/19 0430 05/20/19 0115 05/21/19 0500 05/22/19 0500 05/23/19 0500  AST 34 33 31 42* 37  ALT 21 19 17 20 21   ALKPHOS 55 72 70 69 57  BILITOT 0.2* 0.4 0.5 0.3 0.5  PROT 5.8* 5.0* 4.9* 5.0* 5.1*  ALBUMIN 3.3* 2.7* 2.4* 2.4* 2.4*    Cardiac Enzymes: No results for input(s): CKTOTAL, CKMB, CKMBINDEX, TROPONINI in the last 168 hours.  CBG: Recent Labs  Lab 05/22/19 1917 05/22/19 2312 05/23/19 0326 05/23/19 0753 05/23/19 1206  GLUCAP 182* 156* 94 98 144*    Anemia Panel: No results for input(s): VITAMINB12, FOLATE, FERRITIN, TIBC, IRON, RETICCTPCT in the last 72 hours.  Recent Results (from the past 240 hour(s))  SARS Coronavirus 2 (CEPHEID - Performed in [facility] hospital lab), Hosp Order     Status: Abnormal   Collection Time: 05/14/19  5:20 AM   Specimen: Nasopharyngeal Swab  Result Value Ref Range Status   SARS Coronavirus 2 POSITIVE (A) NEGATIVE Final    Comment: RESULT CALLED TO, READ BACK BY AND VERIFIED WITH: RN C CHRISCO @0630  05/14/19 BY S GEZAHEGN (NOTE) If result is NEGATIVE SARS-CoV-2 target nucleic acids are NOT DETECTED. The SARS-CoV-2 RNA is generally detectable in upper and lower  respiratory specimens during the acute phase of infection. The lowest  concentration of SARS-CoV-2 viral copies this assay can detect is 250  copies / mL. A negative result does not preclude SARS-CoV-2 infection  and should not be used as the sole basis for treatment or other  patient management decisions.  A negative result may occur with  improper specimen collection / handling, submission of specimen other  than nasopharyngeal swab, presence of viral mutation(s) within the  areas targeted by this assay, and inadequate number of viral copies  (<250 copies / mL). A negative result must be combined  with clinical  observations, patient history, and epidemiological information. If result is POSITIVE SARS-CoV-2 target nucleic acids are DETECTED . The SARS-CoV-2 RNA is generally detectable in upper and lower  respiratory specimens during the acute phase of infection.  Positive  results are indicative of active infection with SARS-CoV-2.  Clinical  correlation with patient history and other diagnostic information is  necessary to determine patient infection status.  Positive results do  not rule out bacterial infection or co-infection with other viruses. If result is PRESUMPTIVE POSTIVE SARS-CoV-2 nucleic acids MAY BE PRESENT.   A presumptive positive result was obtained on the submitted specimen  and confirmed on repeat testing.  While 2019 novel coronavirus  (SARS-CoV-2) nucleic acids may be present in the submitted sample  additional confirmatory testing may be necessary for epidemiological  and / or clinical management purposes  to differentiate between  SARS-CoV-2 and other Sarbecovirus currently known to infect humans.  If clinically indicated additional testing with an alternate test  methodology [phone] ) is advised. The SARS-CoV-2 RNA is generally  detectable in upper and lower respiratory specimens during the acute  phase of infection. The expected result is Negative. Fact Sheet for Patients:  StrictlyIdeas.no Fact Sheet for Healthcare Providers:  BankingDealers.co.za This test is not yet approved or cleared by the [place] and has been authorized for detection and/or diagnosis of SARS-CoV-2 by FDA under an Emergency Use Authorization (EUA).  This EUA will remain in effect (meaning this test can be used) for the duration of the COVID-19 declaration under Section 564(b)(1) of the Act, 21 U.S.C. section 360bbb-3(b)(1), unless the authorization is terminated or revoked sooner. Performed at [facility], [place] [address], [place], [place] [address]   Urine culture     Status: Abnormal   Collection Time: 05/14/19 11:30 AM   Specimen: Urine, Clean Catch  Result Value Ref Range Status   Specimen Description   Final    URINE, CLEAN CATCH Performed at [facility], [place] [address] [place], [place] [address]    Special Requests   Final    NONE Performed at [facility], [place] [address] [place], [place] [address]    Culture (A)  Final    10,000 COLONIES/mL MULTIPLE SPECIES PRESENT, SUGGEST RECOLLECTION   Report Status 05/16/2019 FINAL  Final  MRSA PCR Screening     Status: None   Collection Time: 05/18/19  5:16 AM   Specimen: Nasal Mucosa; Nasopharyngeal  Result Value Ref Range Status   MRSA by PCR NEGATIVE NEGATIVE Final    Comment:        The GeneXpert MRSA Assay (FDA approved for NASAL specimens only), is one component of a comprehensive MRSA colonization surveillance program. It is not intended to diagnose MRSA infection nor to guide or monitor treatment for MRSA infections. Performed at [facility], [place] [address] [place], [place] [address]   Culture, respiratory (non-expectorated)     Status: None (Preliminary result)   Collection Time: 05/22/19  5:00 AM   Specimen: Tracheal Aspirate; Respiratory  Result Value Ref Range Status   Specimen Description   Final    TRACHEAL ASPIRATE Performed at [place] [address], [place], [place] [address]    Special Requests   Final    NONE Performed at [facility], [place] [address], [place], [place] [address]    Gram Stain   Final    MODERATE WBC PRESENT,BOTH PMN AND MONONUCLEAR ABUNDANT GRAM POSITIVE COCCI IN PAIRS IN CLUSTERS MODERATE GRAM VARIABLE ROD    Culture   Final    FEW Consistent with normal respiratory flora. Performed at [facility], [place] [address] [place], [place] [address]    Report Status PENDING  Incomplete   Culture, Urine     Status: None   Collection Time: 05/22/19  5:00 AM   Specimen: Urine, Clean Catch  Result Value Ref Range Status   Specimen Description   Final    URINE, CLEAN CATCH Performed at [facility], [place] [address], [place], [place] [address]    Special Requests   Final    NONE Performed at [facility], [place] [address] [place], [place] [address]    Culture   Final    NO GROWTH Performed at [facility], [place] [address] [place], [place] [address]    Report Status 05/23/2019 FINAL  Final      Radiology Studies: Dg Chest Port 1 View  Result Date: 05/22/2019 CLINICAL DATA:  Endotracheal tube present. EXAM: PORTABLE CHEST 1 VIEW COMPARISON:  Radiograph May 21, 2019. FINDINGS: Endotracheal and nasogastric tubes are unchanged in position. Right internal jugular catheter is unchanged.  Left-sided pacemaker is unchanged. Atherosclerosis of thoracic aorta is noted. No pneumothorax is noted. Stable bibasilar opacities are noted concerning for inflammation or atelectasis with small pleural effusions. Bony thorax is unremarkable. IMPRESSION: Stable support apparatus. Stable bibasilar opacities as described above. Electronically Signed   By: Marijo Conception M.D.   On: 05/22/2019 09:36       LOS: 9 days   Phillips Climes MD  [facility] Pager on [email or web address]  05/23/2019, 2:04 PM

## 2019-05-23 NOTE — Progress Notes (Signed)
Subjective:  Off pressors UOP over 3 liters last 24 hours - renal function numbers steady to improved-  sodium improved   Objective Vital signs in last 24 hours: Vitals:   05/23/19 0700 05/23/19 0800 05/23/19 0824 05/23/19 1000  BP: 123/67 110/62  119/67  Pulse: 60 60  85  Resp: (!) 25 (!) 23  19  Temp:  (!) 96.2 F (35.7 C)  97.6 F (36.4 C)  TempSrc:  Oral  Oral  SpO2: 99% 98% 96% 99%  Weight:      Height:       Weight change: 1.2 kg  Intake/Output Summary (Last 24 hours) at 05/23/2019 1103 Last data filed at 05/23/2019 1030 Gross per 24 hour  Intake 4054.35 ml  Output 3795 ml  Net 259.35 ml   Assessment/Plan: 73 year old BF with multiple medical issues including COPD, history of lung CA and CKD.  Now presenting with COVID and complications including vent dependence and recent hemodynamic instability 1.Renal- A on CRF- baseline crt seems to be in the mid 2's. Worsened with clinical events this hospitalization. At this time is nonoliguric and I see no needs for RRT.  She has held her own for several days now  2. Hypertension/volume  - acute hypotension that seems  better.  The high sodium indicates free water deficit.  At last check sodium better- titrate free water as needed 3. Elytes- K, phos and Mag all OK  -  not concerning 4. Anemia  - was reasonable- did drop into the 8's , then 7's today.  Supportive care- per primary and CCM - transfused 7/17 5.  Acid/base- serum bicard 20- last pH over 7.4-  No acute need for action   Renal wise has held her own for several days, no role for renal replacement therapy at this time.  Renal will sign off but please call back if we can be of assist    [place]: Basic Metabolic Panel: Recent Labs  Lab 05/19/19 0430  05/19/19 1700 05/20/19 0115  05/21/19 0500 05/22/19 0500 05/23/19 0500  NA 156*   < >  --  153*   < > 151* 147* 144  K 3.9   < >  --  4.2   < > 4.8 4.6 4.7  CL 117*  --   --  119*  --  119* 118*  115*  CO2 18*  --   --  23  --  22 18* 20*  GLUCOSE 27*  --   --  203*  --  50* 143* 116*  BUN 99*  --   --  93*  --  85* 91* 94*  CREATININE 3.25*  --   --  3.18*  --  3.26* 3.14* 2.74*  CALCIUM 8.9  --   --  8.0*  --  8.5* 8.3* 8.6*  PHOS 2.8  --  5.2* 5.3*  --   --   --   --    < > = values in this interval not displayed.   Liver Function Tests: Recent Labs  Lab 05/21/19 0500 05/22/19 0500 05/23/19 0500  AST 31 42* 37  ALT 17 20 21   ALKPHOS 70 69 57  BILITOT 0.5 0.3 0.5  PROT 4.9* 5.0* 5.1*  ALBUMIN 2.4* 2.4* 2.4*   No results for input(s): LIPASE, AMYLASE in the last 168 hours. No results for input(s): AMMONIA in the last 168 hours. CBC: Recent Labs  Lab 05/19/19 1700 05/20/19 0115  05/21/19 0500 05/22/19 0500 05/23/19 0500  WBC QUESTIONABLE RESULTS, RECOMMEND RECOLLECT TO VERIFY 24.0*  --  17.5* 15.0* 11.3*  HGB QUESTIONABLE RESULTS, RECOMMEND RECOLLECT TO VERIFY 10.2*   < > 8.2* 7.5* 9.3*  HCT QUESTIONABLE RESULTS, RECOMMEND RECOLLECT TO VERIFY 31.1*   < > 26.0* 23.6* 28.7*  MCV QUESTIONABLE RESULTS, RECOMMEND RECOLLECT TO VERIFY 91.7  --  92.9 93.3 92.3  PLT QUESTIONABLE RESULTS, RECOMMEND RECOLLECT TO VERIFY 275  --  212 181 153   < > = values in this interval not displayed.   Cardiac Enzymes: No results for input(s): CKTOTAL, CKMB, CKMBINDEX, TROPONINI in the last 168 hours. CBG: Recent Labs  Lab 05/22/19 1528 05/22/19 1917 05/22/19 2312 05/23/19 0326 05/23/19 0753  GLUCAP 126* 182* 156* 94 98    Iron Studies:  No results for input(s): IRON, TIBC, TRANSFERRIN, FERRITIN in the last 72 hours. Studies/Results: Dg Chest Port 1 View  Result Date: 05/22/2019 CLINICAL DATA:  Endotracheal tube present. EXAM: PORTABLE CHEST 1 VIEW COMPARISON:  Radiograph May 21, 2019. FINDINGS: Endotracheal and nasogastric tubes are unchanged in position. Right internal jugular catheter is unchanged. Left-sided pacemaker is unchanged. Atherosclerosis of thoracic aorta is  noted. No pneumothorax is noted. Stable bibasilar opacities are noted concerning for inflammation or atelectasis with small pleural effusions. Bony thorax is unremarkable. IMPRESSION: Stable support apparatus. Stable bibasilar opacities as described above. Electronically Signed   By: Marijo Conception M.D.   On: 05/22/2019 09:36   Medications: Infusions: . sodium chloride Stopped (05/23/19 0622)  . dexmedetomidine (PRECEDEX) IV infusion 0.4 mcg/kg/hr (05/23/19 0625)  . dextrose Stopped (05/20/19 1431)  . feeding supplement (VITAL AF 1.2 CAL) 1,000 mL (05/22/19 1530)  . fentaNYL infusion INTRAVENOUS Stopped (05/23/19 1000)  . phenylephrine (NEO-SYNEPHRINE) Adult infusion Stopped (05/22/19 1539)  . piperacillin-tazobactam (ZOSYN)  IV 2.25 g (05/23/19 0622)    Scheduled Medications: . chlorhexidine  15 mL Mouth/Throat BID  . Chlorhexidine Gluconate Cloth  6 each Topical Daily  . docusate  100 mg Oral BID  . feeding supplement (PRO-STAT SUGAR FREE 64)  30 mL Per Tube BID  . free water  400 mL Per Tube Q3H  . insulin aspart  0-20 Units Subcutaneous Q4H  . insulin aspart  6 Units Subcutaneous Q4H  . insulin detemir  15 Units Subcutaneous BID  . ipratropium-albuterol  3 mL Nebulization Q6H  . mouth rinse  15 mL Mouth Rinse 10 times per day  . methylPREDNISolone (SOLU-MEDROL) injection  20 mg Intravenous Q24H  . pantoprazole (PROTONIX) IV  40 mg Intravenous Q12H  . QUEtiapine  50 mg Oral BID  . sodium chloride flush  3 mL Intravenous Q12H  . vancomycin variable dose per unstable renal function (pharmacist dosing)   Does not apply See admin instructions    have reviewed scheduled and prn medications.  Physical Exam: As patient is on full COVID precautions a full exam was not done in order to preserve PPE and to limit exposure to other patients and providers.  [facility]   05/23/2019,11:03 AM  LOS: 9 days

## 2019-05-23 NOTE — Progress Notes (Signed)
NAME:  Betty [name], MRN:  [id number], DOB:  1946-09-01, LOS: 9 ADMISSION DATE:  05/13/2019, CONSULTATION DATE: July 10 REFERRING MD: Cruzita Lederer, CHIEF COMPLAINT: Dyspnea  Brief History   73 year old female with multiple medical problems admitted on July 9 with worsening dyspnea in setting of severe acute respiratory failure with hypoxemia due to COVID-19 pneumonia. Baselie 2019 creat 2.3 to 2.6  Past Medical History  COPD NSCLC 3846 CKD Systolic heart failure Hypertension  Significant Hospital Events   7/9 admission 7/10 transfer to ICU July 11 attempted to self extubate July 13 extubated, reintubated later in the day due to encephalopathy, developed hypotension, A. fib with RVR July 14, remains hypotensive on vasopressors, increased NG output. This morning had increased NG output, and shock, requiring Levophed, lactic acid elevated at 4   May 20, 2019: Remains on ventilator with FiO2 40% and PEEP of 5.  Pulse ox 91%.  Tube feed is on hold because of vomiting.  Is on amnio drip.  Is also on Precedex.  Has wheezing on exam.  7/16- remains on vent with 40% fio2, peep 5. Did SBT but on on fent gtt,. Also on neo gtt and amio gtt. Being paced. Hypernatremic with rising Na . PCT up at 6. FEbrile over night 101.6 F overnight  Failed fent wean with tachypnea  7/17 - - remains on vent. Off amio x  1 day -> HR 61 and paced. Had L GIB yesterday. But coming down on pressor this AM - neo at 30. Hgb 7.5gm% and down by 1gm%. Fails SBT - she is diffusely weak and exhausted - > gets tachypneic. High Na/AKi better with increased free water. Currently on fent gtt, precedex gtt and neo gtt  Consults:  PCCM  Procedures:  7/10 ETT > July 14 extubated, re-intubated >  7/10 R IJ CVL >   Significant Diagnostic Tests:  04/2019 LVEF 30-35%, LVH, LV hypokinesis, RV normal function, minimal valve insufficiencies  Micro Data:  7/9 SARS COV2 >  7/9 Urine culture >   Antimicrobials:  7/9 remdesivir > 7/13  7/9 solumedrol >  7/13  7/14 zosyn >       Interim history/subjective:    05/23/2019 - does sBT on fent gtt and precedex gtt but off fent gtt -> paradoxical in a few minutes. Difused weak. Not on pressors . Making urine with free water. Creat better. Off neo. Remains paced  Objective   Blood pressure 119/67, pulse 85, temperature 97.6 F (36.4 C), temperature source Oral, resp. rate 19, height 5\' 4"  (1.626 m), weight 63.2 kg, SpO2 99 %. CVP:  [14 mmHg] 14 mmHg  Vent Mode: PCV FiO2 (%):  [40 %] 40 % Set Rate:  [24 bmp] 24 bmp PEEP:  [5 cmH20] 5 cmH20 Pressure Support:  [8 cmH20] 8 cmH20   Intake/Output Summary (Last 24 hours) at 05/23/2019 1214 Last data filed at 05/23/2019 1030 Gross per 24 hour  Intake 3500.19 ml  Output 3620 ml  Net -119.81 ml   Filed Weights   05/21/19 0500 05/22/19 0444 05/23/19 0455  Weight: 58.6 kg 62 kg 63.2 kg  ,3    LABS    PULMONARY Recent Labs  Lab 05/18/19 2022 05/19/19 1101 05/20/19 0546  PHART 7.309*  --  7.409  PCO2ART 42.5  --  30.6*  PO2ART 62.0*  --  63.0*  HCO3 21.3 19.0* 19.5*  TCO2 23 20* 20*  O2SAT 89.0 75.0 93.0    CBC Recent Labs  Lab 05/21/19 0500 05/22/19  0500 05/23/19 0500  HGB 8.2* 7.5* 9.3*  HCT 26.0* 23.6* 28.7*  WBC 17.5* 15.0* 11.3*  PLT 212 181 153    COAGULATION No results for input(s): INR in the last 168 hours.  CARDIAC  No results for input(s): TROPONINI in the last 168 hours. No results for input(s): PROBNP in the last 168 hours.   CHEMISTRY Recent Labs  Lab 05/18/19 1730 05/18/19 1845  05/19/19 0430  05/19/19 1700 05/20/19 0115 05/20/19 0546 05/21/19 0500 05/22/19 0500 05/23/19 0500  NA 158*  --    < > 156*   < >  --  153* 153* 151* 147* 144  K 3.6  --    < > 3.9   < >  --  4.2 4.1 4.8 4.6 4.7  CL 122*  --   --  117*  --   --  119*  --  119* 118* 115*  CO2 21*  --   --  18*  --   --  23  --  22 18* 20*  GLUCOSE 84  --   --  27*  --   --  203*  --  50* 143* 116*  BUN 96*  --    --  99*  --   --  93*  --  85* 91* 94*  CREATININE 2.75*  --   --  3.25*  --   --  3.18*  --  3.26* 3.14* 2.74*  CALCIUM 9.0  --   --  8.9  --   --  8.0*  --  8.5* 8.3* 8.6*  MG 2.5* 2.4  --  1.9  --  2.0 2.1  --  2.3 2.2 2.1  PHOS 2.1* 3.2  --  2.8  --  5.2* 5.3*  --   --   --   --    < > = values in this interval not displayed.   Estimated Creatinine Clearance: 15.6 mL/min (A) (by C-G formula based on SCr of 2.74 mg/dL (H)).   LIVER Recent Labs  Lab 05/19/19 0430 05/20/19 0115 05/21/19 0500 05/22/19 0500 05/23/19 0500  AST 34 33 31 42* 37  ALT 21 19 17 20 21   ALKPHOS 55 72 70 69 57  BILITOT 0.2* 0.4 0.5 0.3 0.5  PROT 5.8* 5.0* 4.9* 5.0* 5.1*  ALBUMIN 3.3* 2.7* 2.4* 2.4* 2.4*     INFECTIOUS Recent Labs  Lab 05/19/19 0935 05/19/19 1700 05/21/19 0500 05/22/19 0500 05/23/19 0500  LATICACIDVEN 4.0* 3.7*  --  1.4  --   PROCALCITON  --   --  6.44 4.99 3.04     ENDOCRINE CBG (last 3)  Recent Labs    05/22/19 2312 05/23/19 0326 05/23/19 0753  GLUCAP 156* 94 98         IMAGING x48h  - image(s) personally visualized  -   highlighted in bold Dg Chest Port 1 View  Result Date: 05/22/2019 CLINICAL DATA:  Endotracheal tube present. EXAM: PORTABLE CHEST 1 VIEW COMPARISON:  Radiograph May 21, 2019. FINDINGS: Endotracheal and nasogastric tubes are unchanged in position. Right internal jugular catheter is unchanged. Left-sided pacemaker is unchanged. Atherosclerosis of thoracic aorta is noted. No pneumothorax is noted. Stable bibasilar opacities are noted concerning for inflammation or atelectasis with small pleural effusions. Bony thorax is unremarkable. IMPRESSION: Stable support apparatus. Stable bibasilar opacities as described above. Electronically Signed   By: Marijo Conception M.D.   On: 05/22/2019 09:36     Resolved Hospital Problem list  Assessment & Plan:  Acute respiratory failure with hypoxemia: At this point mostly due to encephalopathy and inability  to protect airway  suspect COPD contributing  05/23/2019 - > does not meet criteria for SBT/Extubation in setting of Acute Respiratory Failure due to failure to wean due to severe deconditoning   PLAN  - full vent support Ventilator associated pneumonia prevention protocol Combivent scheduled    Encephalopathy: Multifactorial: July 14 in part due to sepsis and shock, renal failure and baseline dementia (on aricept)  05/23/2019 - vent dysnchrony as soon as fent gtt turned off preclues complete evaluation of mental status  PLAN Continue precedex gtt Continue fent gtt Continue seroquel Continue versed prn RASs goal 0 to -2 Mmonitor off klonopin, oxycidone prn, aricopet    Shock: Septic, co-ox not consistent with cardiogenic shock  05/23/2019 -resolved  PLAN Dc neo from [facility]  Severe sepsis  - febrile 7/15  with high PCT. Off pressors 7/18  Plan  - continue zosyn - await sputum and urine culture from 7/16   COPD Combivent q6h  Congestive heart failure: Venous O2 saturation and physical exam not consistent with cardiogenic shock on 7/14 Hold bidil  A Fib -  - on amio for several days but beoing paced 05/21/2019 and amio stopped  7/18 - being paced  plan -monitor off admio  CKD> now with non-oliguric failure. Baeline create 2.3-2.6  05/23/2019  -improved after increasing free water. Creat now at baseline Plan  -  free water to cotninue   Concern for LGI bleed  Plan   - per triad  Anemia critical illness and LGI bleed  - .- PRBC for hgb </= 6.9gm%    - exceptions are   -  if ACS susepcted/confirmed then transfuse for hgb </= 8.0gm%,  or    -  active bleeding with hemodynamic instability, then transfuse regardless of hemoglobin value   At at all times try to transfuse 1 unit prbc as possible with exception of active hemorrhage    Goals of care: DNR if arrests, full support otherwise   Best practice:  Diet: tube feeding Pain/Anxiety/Delirium protocol  (if indicated): n/a VAP protocol (if indicated): n/a DVT prophylaxis: sub q heparin GI prophylaxis: Pantoprazole for stress ulcer prophylaxis Glucose control: SSI per TRH Mobility: bed rest, range of motion exercises OK Code Status: DNR  Family Communication: per TRH Disposition: icu     ATTESTATION & SIGNATURE   The patient CAMREIGH MICHIE is critically ill with multiple organ systems failure and requires high complexity decision making for assessment and support, frequent evaluation and titration of therapies, application of advanced monitoring technologies and extensive interpretation of multiple databases.   Critical Care Time devoted to patient care services described in this note is  30  Minutes. This time reflects time of care of this signee Dr Brand Males. This critical care time does not reflect procedure time, or teaching time or supervisory time of PA/NP/Med student/Med Resident etc but could involve care discussion time     Dr. Brand Males, M.D., [facility].C.P Pulmonary and Critical Care Medicine Staff Physician [place] [facility] Pager: [phone], If no answer or between  15:00h - 7:00h: call [phone]  05/23/2019 12:14 PM

## 2019-05-24 LAB — COMPREHENSIVE METABOLIC PANEL
ALT: 23 U/L (ref 0–44)
AST: 39 U/L (ref 15–41)
Albumin: 2.4 g/dL — ABNORMAL LOW (ref 3.5–5.0)
Alkaline Phosphatase: 60 U/L (ref 38–126)
Anion gap: 9 (ref 5–15)
BUN: 90 mg/dL — ABNORMAL HIGH (ref 8–23)
CO2: 22 mmol/L (ref 22–32)
Calcium: 8.7 mg/dL — ABNORMAL LOW (ref 8.9–10.3)
Chloride: 113 mmol/L — ABNORMAL HIGH (ref 98–111)
Creatinine, Ser: 2.38 mg/dL — ABNORMAL HIGH (ref 0.44–1.00)
GFR calc Af Amer: 23 mL/min — ABNORMAL LOW (ref >60)
GFR calc non Af Amer: 19 mL/min — ABNORMAL LOW (ref >60)
Glucose, Bld: 97 mg/dL (ref 70–99)
Potassium: 4.3 mmol/L (ref 3.5–5.1)
Sodium: 144 mmol/L (ref 135–145)
Total Bilirubin: 0.4 mg/dL (ref 0.3–1.2)
Total Protein: 5.2 g/dL — ABNORMAL LOW (ref 6.5–8.1)

## 2019-05-24 LAB — VANCOMYCIN, RANDOM: Vancomycin Rm: 8

## 2019-05-24 LAB — PROCALCITONIN: Procalcitonin: 1.48 ng/mL

## 2019-05-24 LAB — CBC
HCT: 30 % — ABNORMAL LOW (ref 36.0–46.0)
Hemoglobin: 9.9 g/dL — ABNORMAL LOW (ref 12.0–15.0)
MCH: 30.7 pg (ref 26.0–34.0)
MCHC: 33 g/dL (ref 30.0–36.0)
MCV: 92.9 fL (ref 80.0–100.0)
Platelets: 158 10*3/uL (ref 150–400)
RBC: 3.23 MIL/uL — ABNORMAL LOW (ref 3.87–5.11)
RDW: 14.2 % (ref 11.5–15.5)
WBC: 10.7 10*3/uL — ABNORMAL HIGH (ref 4.0–10.5)
nRBC: 0.2 % (ref 0.0–0.2)

## 2019-05-24 LAB — CULTURE, RESPIRATORY W GRAM STAIN: Culture: NORMAL

## 2019-05-24 LAB — C-REACTIVE PROTEIN: CRP: 2 mg/dL — ABNORMAL HIGH (ref <1.0)

## 2019-05-24 LAB — GLUCOSE, CAPILLARY
Glucose-Capillary: 76 mg/dL (ref 70–99)
Glucose-Capillary: 118 mg/dL — ABNORMAL HIGH (ref 70–99)
Glucose-Capillary: 131 mg/dL — ABNORMAL HIGH (ref 70–99)
Glucose-Capillary: 157 mg/dL — ABNORMAL HIGH (ref 70–99)
Glucose-Capillary: 98 mg/dL (ref 70–99)

## 2019-05-24 LAB — D-DIMER, QUANTITATIVE: D-Dimer, Quant: 3.75 ug/mL-FEU — ABNORMAL HIGH (ref 0.00–0.50)

## 2019-05-24 LAB — MAGNESIUM: Magnesium: 2 mg/dL (ref 1.7–2.4)

## 2019-05-24 MED ORDER — IPRATROPIUM-ALBUTEROL 0.5-2.5 (3) MG/3ML IN SOLN: 3.0000 mL | Freq: Four times a day (QID) | RESPIRATORY_TRACT | Status: DC | PRN

## 2019-05-24 MED ORDER — ENOXAPARIN SODIUM 60 MG/0.6ML ~~LOC~~ SOLN
1.0000 mg/kg | SUBCUTANEOUS | Status: DC
  Administered 2019-05-24 – 2019-05-27 (×4): 60 mg via SUBCUTANEOUS
  Filled 2019-05-24 (×4): qty 0.6

## 2019-05-24 NOTE — Progress Notes (Signed)
Updated son  Betty [name] via phone. He states he will keep rest of family informed. All questions answered. [name]

## 2019-05-24 NOTE — Progress Notes (Addendum)
NAME:  Betty [name], MRN:  [id number], DOB:  [birth year], LOS: 10 ADMISSION DATE:  05/13/2019, CONSULTATION DATE: July 10 REFERRING MD: Cruzita Lederer, CHIEF COMPLAINT: Dyspnea  Brief History   73 year old female with multiple medical problems admitted on July 9 with worsening dyspnea in setting of severe acute respiratory failure with hypoxemia due to COVID-19 pneumonia. Baselie 2019 creat 2.3 to 2.6  Past Medical History  COPD NSCLC 4403 CKD Systolic heart failure Hypertension  Significant Hospital Events   7/9 admission 7/10 transfer to ICU July 11 attempted to self extubate July 13 extubated, reintubated later in the day due to encephalopathy, developed hypotension, A. fib with RVR July 14, remains hypotensive on vasopressors, increased NG output. This morning had increased NG output, and shock, requiring Levophed, lactic acid elevated at 4   May 20, 2019: Remains on ventilator with FiO2 40% and PEEP of 5.  Pulse ox 91%.  Tube feed is on hold because of vomiting.  Is on amnio drip.  Is also on Precedex.  Has wheezing on exam.  7/16- remains on vent with 40% fio2, peep 5. Did SBT but on on fent gtt,. Also on neo gtt and amio gtt. Being paced. Hypernatremic with rising Na . PCT up at 6. FEbrile over night 101.6 F overnight  Failed fent wean with tachypnea  7/17 - - remains on vent. Off amio x  1 day -> HR 61 and paced. Had L GIB yesterday. But coming down on pressor this AM - neo at 30. Hgb 7.5gm% and down by 1gm%. Fails SBT - she is diffusely weak and exhausted - > gets tachypneic. High Na/AKi better with increased free water. Currently on fent gtt, precedex gtt and neo gtt  7/18  does sBT on fent gtt and precedex gtt but off fent gtt -> paradoxical in a few minutes. Difused weak. Not on pressors . Making urine with free water. Creat better. Off neo. Remains paced  Consults:  PCCM  Procedures:  7/10 ETT > July 14 extubated, re-intubated >  7/10 R IJ CVL >   Significant Diagnostic  Tests:  04/2019 LVEF 30-35%, LVH, LV hypokinesis, RV normal function, minimal valve insufficiencies  Micro Data:  7/9 SARS COV2 >  7/9 Urine culture >   Antimicrobials:  7/9 remdesivir > 7/13 7/9 solumedrol >  7/13  7/14 zosyn >       Interim history/subjective:    05/24/2019 - fails sbt easily when fent gtt weaned. Too deconditioned. On [employer] . Creat better with free water  Objective   Blood pressure (!) 128/57, pulse 71, temperature 97.9 F (36.6 C), resp. rate 20, height 5\' 4"  (1.626 m), weight 61.1 kg, SpO2 98 %. CVP:  [11 mmHg-14 mmHg] 11 mmHg  Vent Mode: PCV FiO2 (%):  [40 %] 40 % Set Rate:  [24 bmp] 24 bmp PEEP:  [5 cmH20] 5 cmH20   Intake/Output Summary (Last 24 hours) at 05/24/2019 1023 Last data filed at 05/24/2019 0700 Gross per 24 hour  Intake 4695.94 ml  Output 4125 ml  Net 570.94 ml   Filed Weights   05/23/19 0455 05/23/19 2300 05/24/19 0500  Weight: 63.2 kg 61.1 kg 61.1 kg  ,3    Resolved Hospital Problem list    Shock: Septic, co-ox not consistent with cardiogenic shock  - resolved 05/22/2019  Assessment & Plan:  Acute respiratory failure with hypoxemia:  suspect COPD contributing   05/24/2019 - > does not meet criteria for SBT/Extubation in setting of Acute Respiratory  Failure due to severe deconditioning and ongoing encephalopathy    PLAN  - full vent support Ventilator associated pneumonia prevention protocol Combivent scheduled    Encephalopathy: Multifactorial: July 14 in part due to sepsis and shock, renal failure and baseline dementia (on aricept) - at home on aricpet but was using walker   05/24/2019 - unable to assess properly due to severe vent dysnchrony when fent gtt weaned   PLAN Continue precedex gtt Continue fent gtt Continue seroquel Continue versed prn RASs goal 0 to -2 Mmonitor off klonopin, oxycidone prn, aricopet   Severe sepsis  - febrile 7/15  with high PCT. Off pressors 7/18  Plan  - continue  zosyn - await sputum and urine culture from 7/16   COPD Combivent q6h  Congestive heart failure: Venous O2 saturation and physical exam not consistent with cardiogenic shock on 7/14  Plan Hold bidil  A Fib -  - on amio for several days but beoing paced 05/21/2019 and amio stopped 05/21/2019  7/19 - being paced  plan -monitor off admio  CKD> now with non-oliguric failure. Baeline create 2.3-2.6  05/24/2019  -improved after increasing free water. Creat now at baseline Plan  -  free water to cotninue but aim to reduce over next few aya   Concern for LGI bleed 05/22/2019 - s/p PRBC  7/19 - no evidence of gi bleed  Plan   - per triad  Anemia critical illness and LGI bleed  - .- PRBC for hgb </= 6.9gm%    - exceptions are   -  if ACS susepcted/confirmed then transfuse for hgb </= 8.0gm%,  or    -  active bleeding with hemodynamic instability, then transfuse regardless of hemoglobin value   At at all times try to transfuse 1 unit prbc as possible with exception of active hemorrhage    Goals of care: DNR if arrests, full support otherwise -> CCM recommends another few to several days of vent support and if unable to extubate then extubate to comfort care   Best practice:  Diet: tube feeding Pain/Anxiety/Delirium protocol (if indicated): n/a VAP protocol (if indicated): n/a DVT prophylaxis: sub q heparin GI prophylaxis: Pantoprazole for stress ulcer prophylaxis Glucose control: SSI per TRH Mobility: bed rest, range of motion exercises OK Code Status: DNR  Family Communication: per TRH Disposition: icu     ATTESTATION & SIGNATURE   The patient Betty [name] is critically ill with multiple organ systems failure and requires high complexity decision making for assessment and support, frequent evaluation and titration of therapies, application of advanced monitoring technologies and extensive interpretation of multiple databases.   Critical Care Time devoted to patient  care services described in this note is  30  Minutes. This time reflects time of care of this signee Dr Brand Males. This critical care time does not reflect procedure time, or teaching time or supervisory time of PA/NP/Med student/Med Resident etc but could involve care discussion time     Dr. Brand Males, M.D., [facility].C.P Pulmonary and Critical Care Medicine Staff Physician [place] [facility] Pager: [phone], If no answer or between  15:00h - 7:00h: call [phone]  05/24/2019 10:23 AM     LABS    PULMONARY Recent Labs  Lab 05/18/19 2022 05/19/19 1101 05/20/19 0546  PHART 7.309*  --  7.409  PCO2ART 42.5  --  30.6*  PO2ART 62.0*  --  63.0*  HCO3 21.3 19.0* 19.5*  TCO2 23 20* 20*  O2SAT 89.0 75.0 93.0    CBC Recent Labs  Lab 05/22/19 0500 05/23/19 0500 05/24/19 0500  HGB 7.5* 9.3* 9.9*  HCT 23.6* 28.7* 30.0*  WBC 15.0* 11.3* 10.7*  PLT 181 153 158    COAGULATION No results for input(s): INR in the last 168 hours.  CARDIAC  No results for input(s): TROPONINI in the last 168 hours. No results for input(s): PROBNP in the last 168 hours.   CHEMISTRY Recent Labs  Lab 05/18/19 1730 05/18/19 1845  05/19/19 0430  05/19/19 1700 05/20/19 0115 05/20/19 0546 05/21/19 0500 05/22/19 0500 05/23/19 0500 05/24/19 0500  NA 158*  --    < > 156*   < >  --  153* 153* 151* 147* 144 144  K 3.6  --    < > 3.9   < >  --  4.2 4.1 4.8 4.6 4.7 4.3  CL 122*  --   --  117*  --   --  119*  --  119* 118* 115* 113*  CO2 21*  --   --  18*  --   --  23  --  22 18* 20* 22  GLUCOSE 84  --   --  27*  --   --  203*  --  50* 143* 116* 97  BUN 96*  --   --  99*  --   --  93*  --  85* 91* 94* 90*  CREATININE 2.75*  --   --  3.25*  --   --  3.18*  --  3.26* 3.14* 2.74* 2.38*  CALCIUM 9.0  --   --  8.9  --   --  8.0*  --  8.5* 8.3* 8.6* 8.7*  MG 2.5* 2.4  --  1.9  --  2.0 2.1  --  2.3 2.2 2.1 2.0  PHOS 2.1* 3.2  --  2.8  --  5.2* 5.3*   --   --   --   --   --    < > = values in this interval not displayed.   Estimated Creatinine Clearance: 17.9 mL/min (A) (by C-G formula based on SCr of 2.38 mg/dL (H)).   LIVER Recent Labs  Lab 05/20/19 0115 05/21/19 0500 05/22/19 0500 05/23/19 0500 05/24/19 0500  AST 33 31 42* 37 39  ALT 19 17 20 21 23   ALKPHOS 72 70 69 57 60  BILITOT 0.4 0.5 0.3 0.5 0.4  PROT 5.0* 4.9* 5.0* 5.1* 5.2*  ALBUMIN 2.7* 2.4* 2.4* 2.4* 2.4*     INFECTIOUS Recent Labs  Lab 05/19/19 0935 05/19/19 1700  05/22/19 0500 05/23/19 0500 05/24/19 0500  LATICACIDVEN 4.0* 3.7*  --  1.4  --   --   PROCALCITON  --   --    < > 4.99 3.04 1.48   < > = values in this interval not displayed.     ENDOCRINE CBG (last 3)  Recent Labs    05/23/19 2325 05/24/19 0334 05/24/19 0755  GLUCAP 90 76 118*         IMAGING x48h  - image(s) personally visualized  -   highlighted in bold No results found.

## 2019-05-24 NOTE — Progress Notes (Addendum)
ANTICOAGULATION CONSULT NOTE - Follow Up Consult  Pharmacy Consult for Lovenox Indication: atrial fibrillation  Allergies  Allergen Reactions  . Sulfa Antibiotics Itching    Patient Measurements: Height: 5\' 4"  (162.6 cm) Weight: 134 lb 11.2 oz (61.1 kg) IBW/kg (Calculated) : 54.7  Vital Signs: Temp: 97.9 F (36.6 C) (07/19 1100) Temp Source: Esophageal (07/19 0400) BP: 181/94 (07/19 1100) Pulse Rate: 129 (07/19 1100)  Labs: Recent Labs    05/22/19 0500 05/23/19 0500 05/24/19 0500  HGB 7.5* 9.3* 9.9*  HCT 23.6* 28.7* 30.0*  PLT 181 153 158  CREATININE 3.14* 2.74* 2.38*    Estimated Creatinine Clearance: 17.9 mL/min (A) (by C-G formula based on SCr of 2.38 mg/dL (H)).   Medications:  Scheduled:  . chlorhexidine  15 mL Mouth/Throat BID  . Chlorhexidine Gluconate Cloth  6 each Topical Daily  . docusate  100 mg Oral BID  . feeding supplement (PRO-STAT SUGAR FREE 64)  30 mL Per Tube BID  . free water  400 mL Per Tube Q3H  . heparin injection (subcutaneous)  5,000 Units Subcutaneous Q8H  . insulin aspart  0-20 Units Subcutaneous Q4H  . insulin aspart  6 Units Subcutaneous Q4H  . insulin detemir  15 Units Subcutaneous BID  . mouth rinse  15 mL Mouth Rinse 10 times per day  . methylPREDNISolone (SOLU-MEDROL) injection  20 mg Intravenous Q24H  . pantoprazole (PROTONIX) IV  40 mg Intravenous Q12H  . QUEtiapine  50 mg Oral BID  . sodium chloride flush  3 mL Intravenous Q12H  . vancomycin variable dose per unstable renal function (pharmacist dosing)   Does not apply See admin instructions   Infusions:  . sodium chloride 10 mL/hr at 05/24/19 0700  . dexmedetomidine (PRECEDEX) IV infusion 0.9 mcg/kg/hr (05/24/19 1128)  . dextrose Stopped (05/20/19 1431)  . feeding supplement (VITAL AF 1.2 CAL) 50 mL/hr at 05/24/19 0000  . fentaNYL infusion INTRAVENOUS 50 mcg/hr (05/24/19 1129)  . piperacillin-tazobactam (ZOSYN)  IV Stopped (05/24/19 0542)   PRN: sodium chloride,  bisacodyl, fentaNYL, ipratropium-albuterol, midazolam, ondansetron **OR** ondansetron (ZOFRAN) IV, polyethylene glycol  Assessment: 73 y.o. female admitted on 05/13/2019 with COVID-19 pneumonia, requiring intubation.  Patient developed atrial fibrillation, started on amiodarone which was stopped, was being AV paced. Has been on VTE prophylaxis with SQ heparin, then developed GI bleed on 7/17 with Hgb decreased 7.5 for which she was transfused.  Hgb up 9.9 today and no evidence of further bleeding so pharmacy consulted to begin full dose anticoagulation with Lovenox.   Goal of Therapy:  Therapeutic anticoagulations Monitor platelets by anticoagulation protocol: Yes   Plan:  Lovenox 1mg /kg SQ q24h for CrCl < 30 ml/min Monitor renal function and CBC  Peggyann Juba, PharmD, BCPS Pharmacy: [phone] 05/24/2019,12:05 PM

## 2019-05-24 NOTE — Progress Notes (Signed)
PROGRESS NOTE  ZAHIRAH CHESLOCK IPJ:[id number] DOB: 02-13-1946 DOA: 05/13/2019  PCP: Josetta Huddle, MD  Brief History/Interval Summary:   - 73 year old female with history of coronary artery disease, non-small cell lung cancer in 2016, hypertension, hyperlipidemia, dementia, COPD, chronic kidney disease stage IV, chronic combined CHF who was brought to the hospital by the patient's son due to increased confusion and weakness.  She was diagnosed with coronavirus in the ED on 7/9.  She was also found to be hypoxic requiring 2 L nasal cannula.  She was admitted to the hospital and placed on steroids along with Remdesivir, deferred to ICU on 7/10, where she required intubation, she was extubated on 7/13, where she was reintubated that same day due to encephalopathy.  Subjective/Interval History:   Discussed with staff, he was hypothermic requiring Bear hugger, having good urine output  Assessment/Plan:  Acute Hypoxic Resp. Failure due to Acute Covid 19 Viral Illness  Vent Mode: PCV FiO2 (%):  [40 %] 40 % Set Rate:  [24 bmp] 24 bmp PEEP:  [5 cmH20] 5 cmH20     Component Value Date/Time   PHART 7.409 05/20/2019 0546   PCO2ART 30.6 (L) 05/20/2019 0546   PO2ART 63.0 (L) 05/20/2019 0546   HCO3 19.5 (L) 05/20/2019 0546   TCO2 20 (L) 05/20/2019 0546   ACIDBASEDEF 5.0 (H) 05/20/2019 0546   O2SAT 93.0 05/20/2019 0546    COVID-19 Labs  Recent Labs    05/22/19 0500 05/23/19 0500 05/24/19 0500  DDIMER 1.68* 3.16* 3.75*  CRP 7.5* 4.1* 2.0*    Lab Results  Component Value Date   SARSCOV2NAA POSITIVE (A) 05/14/2019      Antibiotics: Started on Zosyn 7/14.  Vancomycin started 7/17 Remdesivir: Completed course of Remdesivir Steroids: Solu-Medrol  Diuretics: Given Lasix x1 on 7/10.  Not continued due to increase in creatinine Actemra: Actemra x1 on 7/10 Convalescent Plasma: Not given yet Vitamin C and Zinc: Continue DVT Prophylaxis: will resume on heparin gtt.  -Patient remains on  vent support, she is tolerating some pressure support, but she does not meet criteria for extubation, due to severe deconditioning and ongoing encephalopathy . -Patient per PCCM, on Precedex, fentanyl, Seroquel and as needed Versed. -As discussed with PCCM, appears extremely weak, unlikely to be extubated anytime soon .  Sepsis -Fever on 7/15, with elevated procalcitonin, requiring pressors, currently weaned off pressors. -Has some increased secretion, questionable H CAP, on vancomycin and Zosyn, sputum culture growing gram-positive cocci and gram-negative rods.   Shock Likely a combination of septic as well as cardiogenic shock.  She does have cool extremities.  Venous blood gas shows saturation of 75.    Atrial fibrillation with RVR - She was on amiodarone drip, currently rate controlled, paced rhythm, . - Anticoagulation has been held in the setting of GI bleed, no need for the blood in her stools she will be resumed on anticoagulation.  Lower GI bleed -Patient with blood in her stools, gastric lavage with no evidence of blood, she required 1 unit PRBC transfusion 7/17 with good response, globin is 9.9 this morning. -Monitor closely as she is being resume on full anticoagulation.  Hypernatremia Improving on free water via OGT   Chronic systolic CHF Most recent echocardiogram done in June 2020 showed a EF of 30 to 35%.  Chest x-ray did raise concern for pulmonary edema.  However patient is currently hypotensive.  Acute on chronic kidney disease stage IV Baseline creatinine around 2.0-3.0.  Patient with worsening renal function, currently has  stabilized, remains with good urine output, no indication for CRRT per renal .  Hyperglycemia due to steroids Elevated CBGs are due to steroids.  She has been on multiple courses of prednisone in the last 1 to 2 months for acute gout which is the likely reason for her hyperglycemia.  And also likely reason for her elevated HbA1c.  Continue  long-acting insulin and SSI for now.  CBG should improve as steroid is tapered down.     History of dementia/acute metabolic encephalopathy Appears to be mild dementia.  Patient is able to live independently.  Apparently has poor short-term memory.  Patient has encephalopathy most likely due to acute illness and medications.   No obvious deficits noted.  History of non-small cell lung cancer and COPD She is status post right lower lobe lobectomy and has completed 4 cycles of adjuvant chemotherapy.  Lung cancer appears to be in remission.    History of gout Initially it was thought that patient is on chronic prednisone at home but review of previous notes did not mention prednisone.  Discussed on steroids with patient's son.  Apparently over the last 1 to 2 months she has had issues with acute gout for which she was prescribed courses of steroids.  She does not take steroid on a regular basis.  However she was on a course of prednisone at the time of admission.  Nutrition Speech therapy to evaluate tube feedings on hold currently  Goals of care Patient appears to have taken a turn for the worse. Previous MD discussed in detail with patient's son.  He is agreeable to DNR currently.  He does want to pursue CRRT and dialysis if that is an option.  He understands that patient is getting worse.  He understands that she may not survive this hospitalization.  DVT Prophylaxis: Heparin changed over to SCDs PUD Prophylaxis: Protonix Code Status: DNR Family Communication: discuss with son on daily basis. Disposition Plan: Will remain in ICU  Reason for Visit: Acute respiratory disease due to COVID-19  Consultants: Pulmonology  Procedures:  Intubation 7/10 Central line placement right IJ 7/10  Antibiotics: Anti-infectives (From admission, onward)   Start     Dose/Rate Route Frequency Ordered Stop   05/19/19 1030  piperacillin-tazobactam (ZOSYN) IVPB 2.25 g     2.25 g 100 mL/hr over 30 Minutes  Intravenous Every 8 hours 05/19/19 1023     05/15/19 1230  remdesivir 100 mg in sodium chloride 0.9 % 250 mL IVPB     100 mg 500 mL/hr over 30 Minutes Intravenous Every 24 hours 05/14/19 1148 05/18/19 1340   05/14/19 1230  remdesivir 200 mg in sodium chloride 0.9 % 250 mL IVPB     200 mg 500 mL/hr over 30 Minutes Intravenous Once 05/14/19 1148 05/14/19 1551        Medications:  Scheduled: . chlorhexidine  15 mL Mouth/Throat BID  . Chlorhexidine Gluconate Cloth  6 each Topical Daily  . docusate  100 mg Oral BID  . feeding supplement (PRO-STAT SUGAR FREE 64)  30 mL Per Tube BID  . free water  400 mL Per Tube Q3H  . heparin injection (subcutaneous)  5,000 Units Subcutaneous Q8H  . insulin aspart  0-20 Units Subcutaneous Q4H  . insulin aspart  6 Units Subcutaneous Q4H  . insulin detemir  15 Units Subcutaneous BID  . mouth rinse  15 mL Mouth Rinse 10 times per day  . methylPREDNISolone (SOLU-MEDROL) injection  20 mg Intravenous Q24H  . pantoprazole (  PROTONIX) IV  40 mg Intravenous Q12H  . QUEtiapine  50 mg Oral BID  . sodium chloride flush  3 mL Intravenous Q12H  . vancomycin variable dose per unstable renal function (pharmacist dosing)   Does not apply See admin instructions   Continuous: . sodium chloride 10 mL/hr at 05/24/19 0700  . dexmedetomidine (PRECEDEX) IV infusion 0.9 mcg/kg/hr (05/24/19 1128)  . dextrose Stopped (05/20/19 1431)  . feeding supplement (VITAL AF 1.2 CAL) 50 mL/hr at 05/24/19 0000  . fentaNYL infusion INTRAVENOUS 50 mcg/hr (05/24/19 1129)  . piperacillin-tazobactam (ZOSYN)  IV Stopped (05/24/19 0542)   ZDG:LOVFIE chloride, bisacodyl, fentaNYL, ipratropium-albuterol, midazolam, ondansetron **OR** ondansetron (ZOFRAN) IV, polyethylene glycol   Objective:  Vital Signs  Vitals:   05/24/19 0803 05/24/19 0900 05/24/19 1000 05/24/19 1100  BP: (!) 128/57 (!) 149/70 113/61 (!) 181/94  Pulse: 71 (!) 106 92 (!) 129  Resp: 20 (!) 29 (!) 21 (!) 36  Temp: 97.9  F (36.6 C) 98.4 F (36.9 C) 98.4 F (36.9 C) 97.9 F (36.6 C)  TempSrc:      SpO2: 98% 99% 98% 95%  Weight:      Height:        Intake/Output Summary (Last 24 hours) at 05/24/2019 1130 Last data filed at 05/24/2019 1100 Gross per 24 hour  Intake 5451.22 ml  Output 4325 ml  Net 1126.22 ml   Filed Weights   05/23/19 0455 05/23/19 2300 05/24/19 0500  Weight: 63.2 kg 61.1 kg 61.1 kg    Sedated, Intubated, in no apparent distress Symmetrical Chest wall movement, Good air movement bilaterally, CTAB RRR,No Gallops,Rubs or new Murmurs, No Parasternal Heave +ve B.Sounds, Abd Soft, No tenderness, No rebound - guarding or rigidity. No Cyanosis, Clubbing or edema, No new Rash or bruise       Lab Results:  Data Reviewed: I have personally reviewed following labs and imaging studies  CBC: Recent Labs  Lab 05/20/19 0115 05/20/19 0546 05/21/19 0500 05/22/19 0500 05/23/19 0500 05/24/19 0500  WBC 24.0*  --  17.5* 15.0* 11.3* 10.7*  HGB 10.2* 9.9* 8.2* 7.5* 9.3* 9.9*  HCT 31.1* 29.0* 26.0* 23.6* 28.7* 30.0*  MCV 91.7  --  92.9 93.3 92.3 92.9  PLT 275  --  212 181 153 332    Basic Metabolic Panel: Recent Labs  Lab 05/18/19 1730 05/18/19 1845  05/19/19 0430  05/19/19 1700 05/20/19 0115 05/20/19 0546 05/21/19 0500 05/22/19 0500 05/23/19 0500 05/24/19 0500  NA 158*  --    < > 156*   < >  --  153* 153* 151* 147* 144 144  K 3.6  --    < > 3.9   < >  --  4.2 4.1 4.8 4.6 4.7 4.3  CL 122*  --   --  117*  --   --  119*  --  119* 118* 115* 113*  CO2 21*  --   --  18*  --   --  23  --  22 18* 20* 22  GLUCOSE 84  --   --  27*  --   --  203*  --  50* 143* 116* 97  BUN 96*  --   --  99*  --   --  93*  --  85* 91* 94* 90*  CREATININE 2.75*  --   --  3.25*  --   --  3.18*  --  3.26* 3.14* 2.74* 2.38*  CALCIUM 9.0  --   --  8.9  --   --  8.0*  --  8.5* 8.3* 8.6* 8.7*  MG 2.5* 2.4  --  1.9  --  2.0 2.1  --  2.3 2.2 2.1 2.0  PHOS 2.1* 3.2  --  2.8  --  5.2* 5.3*  --   --   --   --    --    < > = values in this interval not displayed.    GFR: Estimated Creatinine Clearance: 17.9 mL/min (A) (by C-G formula based on SCr of 2.38 mg/dL (H)).  Liver Function Tests: Recent Labs  Lab 05/20/19 0115 05/21/19 0500 05/22/19 0500 05/23/19 0500 05/24/19 0500  AST 33 31 42* 37 39  ALT 19 17 20 21 23   ALKPHOS 72 70 69 57 60  BILITOT 0.4 0.5 0.3 0.5 0.4  PROT 5.0* 4.9* 5.0* 5.1* 5.2*  ALBUMIN 2.7* 2.4* 2.4* 2.4* 2.4*    Cardiac Enzymes: No results for input(s): CKTOTAL, CKMB, CKMBINDEX, TROPONINI in the last 168 hours.  CBG: Recent Labs  Lab 05/23/19 1530 05/23/19 2010 05/23/19 2325 05/24/19 0334 05/24/19 0755  GLUCAP 144* 144* 90 76 118*    Anemia Panel: No results for input(s): VITAMINB12, FOLATE, FERRITIN, TIBC, IRON, RETICCTPCT in the last 72 hours.  Recent Results (from the past 240 hour(s))  MRSA PCR Screening     Status: None   Collection Time: 05/18/19  5:16 AM   Specimen: Nasal Mucosa; Nasopharyngeal  Result Value Ref Range Status   MRSA by PCR NEGATIVE NEGATIVE Final    Comment:        The GeneXpert MRSA Assay (FDA approved for NASAL specimens only), is one component of a comprehensive MRSA colonization surveillance program. It is not intended to diagnose MRSA infection nor to guide or monitor treatment for MRSA infections. Performed at [facility], [place] [address], [place], [place] [address]   Culture, respiratory (non-expectorated)     Status: None   Collection Time: 05/22/19  5:00 AM   Specimen: Tracheal Aspirate; Respiratory  Result Value Ref Range Status   Specimen Description   Final    TRACHEAL ASPIRATE Performed at [place] [address] [place], [place] [address]    Special Requests   Final    NONE Performed at [facility], [place] [address], [place], [place] [address]    Gram Stain   Final    MODERATE WBC PRESENT,BOTH PMN AND MONONUCLEAR ABUNDANT GRAM POSITIVE  COCCI IN PAIRS IN CLUSTERS MODERATE GRAM VARIABLE ROD    Culture   Final    FEW Consistent with normal respiratory flora. Performed at [facility], [place] [address] [place], [place] [address]    Report Status 05/24/2019 FINAL  Final  Culture, Urine     Status: None   Collection Time: 05/22/19  5:00 AM   Specimen: Urine, Clean Catch  Result Value Ref Range Status   Specimen Description   Final    URINE, CLEAN CATCH Performed at [facility], [place] [address] [place], [place] [address]    Special Requests   Final    NONE Performed at [facility], [place] [address] [place], [place] [address]    Culture   Final    NO GROWTH Performed at [facility], [place] [address] [place], [place] [address]    Report Status 05/23/2019 FINAL  Final      Radiology Studies: No results found.     LOS: 10 days   [facility]  MD  [facility] Pager on [email or web address]  05/24/2019, 11:30 AM

## 2019-05-25 ENCOUNTER — Inpatient Hospital Stay (HOSPITAL_COMMUNITY): Payer: Medicare Other

## 2019-05-25 LAB — GLUCOSE, CAPILLARY
Glucose-Capillary: 124 mg/dL — ABNORMAL HIGH (ref 70–99)
Glucose-Capillary: 127 mg/dL — ABNORMAL HIGH (ref 70–99)
Glucose-Capillary: 134 mg/dL — ABNORMAL HIGH (ref 70–99)
Glucose-Capillary: 137 mg/dL — ABNORMAL HIGH (ref 70–99)
Glucose-Capillary: 139 mg/dL — ABNORMAL HIGH (ref 70–99)
Glucose-Capillary: 149 mg/dL — ABNORMAL HIGH (ref 70–99)
Glucose-Capillary: 151 mg/dL — ABNORMAL HIGH (ref 70–99)
Glucose-Capillary: 58 mg/dL — ABNORMAL LOW (ref 70–99)
Glucose-Capillary: 82 mg/dL (ref 70–99)

## 2019-05-25 LAB — COMPREHENSIVE METABOLIC PANEL
ALT: 26 U/L (ref 0–44)
AST: 38 U/L (ref 15–41)
Albumin: 2.4 g/dL — ABNORMAL LOW (ref 3.5–5.0)
Alkaline Phosphatase: 55 U/L (ref 38–126)
Anion gap: 9 (ref 5–15)
BUN: 93 mg/dL — ABNORMAL HIGH (ref 8–23)
CO2: 21 mmol/L — ABNORMAL LOW (ref 22–32)
Calcium: 8.7 mg/dL — ABNORMAL LOW (ref 8.9–10.3)
Chloride: 112 mmol/L — ABNORMAL HIGH (ref 98–111)
Creatinine, Ser: 2.31 mg/dL — ABNORMAL HIGH (ref 0.44–1.00)
GFR calc Af Amer: 23 mL/min — ABNORMAL LOW (ref >60)
GFR calc non Af Amer: 20 mL/min — ABNORMAL LOW (ref >60)
Glucose, Bld: 57 mg/dL — ABNORMAL LOW (ref 70–99)
Potassium: 4.5 mmol/L (ref 3.5–5.1)
Sodium: 142 mmol/L (ref 135–145)
Total Bilirubin: 0.3 mg/dL (ref 0.3–1.2)
Total Protein: 5.2 g/dL — ABNORMAL LOW (ref 6.5–8.1)

## 2019-05-25 LAB — CBC
HCT: 30.7 % — ABNORMAL LOW (ref 36.0–46.0)
Hemoglobin: 9.8 g/dL — ABNORMAL LOW (ref 12.0–15.0)
MCH: 29.8 pg (ref 26.0–34.0)
MCHC: 31.9 g/dL (ref 30.0–36.0)
MCV: 93.3 fL (ref 80.0–100.0)
Platelets: 162 10*3/uL (ref 150–400)
RBC: 3.29 MIL/uL — ABNORMAL LOW (ref 3.87–5.11)
RDW: 14.3 % (ref 11.5–15.5)
WBC: 11.3 10*3/uL — ABNORMAL HIGH (ref 4.0–10.5)
nRBC: 0.2 % (ref 0.0–0.2)

## 2019-05-25 LAB — PROCALCITONIN: Procalcitonin: 0.91 ng/mL

## 2019-05-25 LAB — D-DIMER, QUANTITATIVE: D-Dimer, Quant: 3.41 ug/mL-FEU — ABNORMAL HIGH (ref 0.00–0.50)

## 2019-05-25 LAB — C-REACTIVE PROTEIN: CRP: 1.1 mg/dL — ABNORMAL HIGH (ref <1.0)

## 2019-05-25 MED ORDER — DEXTROSE 50 % IV SOLN
INTRAVENOUS | Status: AC
  Administered 2019-05-25: 25 mL
  Filled 2019-05-25: qty 50

## 2019-05-25 MED ORDER — INSULIN DETEMIR 100 UNIT/ML ~~LOC~~ SOLN
10.0000 [IU] | Freq: Two times a day (BID) | SUBCUTANEOUS | Status: DC
  Administered 2019-05-25: 10 [IU] via SUBCUTANEOUS
  Filled 2019-05-25 (×2): qty 0.1

## 2019-05-25 MED ORDER — FREE WATER
400.0000 mL | Freq: Four times a day (QID) | Status: DC
  Administered 2019-05-25 – 2019-05-26 (×4): 400 mL

## 2019-05-25 NOTE — Progress Notes (Signed)
Pharmacy Antibiotic Note  Betty [name] is a 73 y.o. female admitted on 05/13/2019 with COVID-19 pneumonia.  Pharmacy has been consulted for Zosyn dosing for sepsis - unable to identify source. WBC 11.3. PCT down to 0.91. Afeb. CRP down to 1.1. LA down to nl.  Noted pt with good UOP (3L yesterday) and Scr down to 2.31 with estimated CrCl 18 ml/min.  Plan: Continue Zosyn 2.25g IV q8h - ends 7 day course today  Height: 5\' 4"  (162.6 cm) Weight: 136 lb 11 oz (62 kg) IBW/kg (Calculated) : 54.7  Temp (24hrs), Avg:97.3 F (36.3 C), Min:95 F (35 C), Max:98.6 F (37 C)  Recent Labs  Lab 05/19/19 0935 05/19/19 1700  05/21/19 0500 05/22/19 0500 05/23/19 0500 05/24/19 0500 05/24/19 1240 05/25/19 0500  WBC  --  QUESTIONABLE RESULTS, RECOMMEND RECOLLECT TO VERIFY   < > 17.5* 15.0* 11.3* 10.7*  --  11.3*  CREATININE  --   --    < > 3.26* 3.14* 2.74* 2.38*  --  2.31*  LATICACIDVEN 4.0* 3.7*  --   --  1.4  --   --   --   --   VANCORANDOM  --   --   --   --   --   --   --  8  --    < > = values in this interval not displayed.    Estimated Creatinine Clearance: 18.5 mL/min (A) (by C-G formula based on SCr of 2.31 mg/dL (H)).    Allergies  Allergen Reactions  . Sulfa Antibiotics Itching    Antimicrobials this admission: 7/9 Remdesivir >>7/13 7/10 Actemra x1 7/14 Zosyn >> 7/20 7/17 Vanc>>7/19  Microbiology results: 7/9 SARS Coronavirus 2: positive 7/9 UCx: 10k colonies, multiple species 7/13 MRSA PCR: negative 7/17 UCx: neg 7/17 TA: gram stain with abundant GPC pairs & clusters, moderate GVR - normal flora (final)  Thank you for allowing pharmacy to be a part of this patient's care.  Sherlon Handing, PharmD, BCPS Clinical pharmacist phone 7am- 5pm: [phone] 05/25/2019 9:28 AM

## 2019-05-25 NOTE — Progress Notes (Signed)
patient sedation weaned back to 25 fent and patient was responsive to voice and able to move extremities.  However at this time her HR increased to 140's and RR increased to 40's.  I then bolused her fent and this calmed her down and returned vitals to baseline.  The MD will contact son to discuss possible extubation and level of care if Betty [name] status declines.

## 2019-05-25 NOTE — Progress Notes (Signed)
PROGRESS NOTE  [place] [place] Betty [name]:[id number] DOB: 13-Nov-1945 DOA: 05/13/2019  PCP: Josetta Huddle, MD  Brief History/Interval Summary:   - 73 year old female with history of coronary artery disease, non-small cell lung cancer in 2016, hypertension, hyperlipidemia, dementia, COPD, chronic kidney disease stage IV, chronic combined CHF who was brought to the hospital by the patient's son due to increased confusion and weakness.  She was diagnosed with coronavirus in the ED on 7/9.  She was also found to be hypoxic requiring 2 L nasal cannula.  She was admitted to the hospital and placed on steroids along with Remdesivir, deferred to ICU on 7/10, where she required intubation, she was extubated on 7/13, where she was reintubated that same day due to encephalopathy.  Subjective/Interval History:   No significant events overnight as discussed with staff  Assessment/Plan:  Acute Hypoxic Resp. Failure due to Acute Covid 19 Viral Illness  Vent Mode: PCV FiO2 (%):  [40 %] 40 % Set Rate:  [24 bmp] 24 bmp PEEP:  [5 cmH20] 5 cmH20     Component Value Date/Time   PHART 7.409 05/20/2019 0546   PCO2ART 30.6 (L) 05/20/2019 0546   PO2ART 63.0 (L) 05/20/2019 0546   HCO3 19.5 (L) 05/20/2019 0546   TCO2 20 (L) 05/20/2019 0546   ACIDBASEDEF 5.0 (H) 05/20/2019 0546   O2SAT 93.0 05/20/2019 0546    COVID-19 Labs  Recent Labs    05/23/19 0500 05/24/19 0500 05/25/19 0500  DDIMER 3.16* 3.75* 3.41*  CRP 4.1* 2.0* 1.1*    Lab Results  Component Value Date   SARSCOV2NAA POSITIVE (A) 05/14/2019      Antibiotics: Started on Zosyn 7/14.  Vancomycin started 7/17 Remdesivir: Completed course of Remdesivir Steroids: Solu-Medrol  Diuretics: Given Lasix x1 on 7/10.  Not continued due to increase in creatinine Actemra: Actemra x1 on 7/10 Convalescent Plasma: Not given yet Vitamin C and Zinc: Continue DVT Prophylaxis: lovenox  -Patient remains on vent support, she is tolerating some pressure support,  discussed with son today about goals of care, one-way extubation versus trach if she fails extubation . -Patient per PCCM, on Precedex, fentanyl, Seroquel and as needed Versed.  Sepsis -Fever on 7/15, with elevated procalcitonin, requiring pressors, currently weaned off pressors. -Has some increased secretion, questionable H CAP, on vancomycin and Zosyn, will far septic work-up remains negative, will DC Zosyn and 24 hours  Shock Likely a combination of septic as well as cardiogenic shock.  She does have cool extremities.  Venous blood gas shows saturation of 75.    Atrial fibrillation with RVR - She was on amiodarone drip, currently rate controlled, paced rhythm, on  Lovenox treatment dose.  Lower GI bleed -Patient with blood in her stools, gastric lavage with no evidence of blood, she required 1 unit PRBC transfusion 7/17 with good response, globin is 9.9 this morning. -Monitor closely as she is on full anticoagulation.  Hypernatremia Improving on free water via OGT   Chronic systolic CHF Most recent echocardiogram done in June 2020 showed a EF of 30 to 35%.  Chest x-ray did raise concern for pulmonary edema.  However patient is currently hypotensive.  Acute on chronic kidney disease stage IV Baseline creatinine around 2.0-3.0.  Patient with worsening renal function, currently has stabilized, remains with good urine output, no indication for CRRT per renal .  Hyperglycemia due to steroids Elevated CBGs are due to steroids.  She has been on multiple courses of prednisone in the last 1 to 2 months for acute  gout which is the likely reason for her hyperglycemia.  And also likely reason for her elevated HbA1c.  Continue long-acting insulin and SSI for now.  CBG should improve as steroid is tapered down.     History of dementia/acute metabolic encephalopathy Appears to be mild dementia.  Patient is able to live independently.  Apparently has poor short-term memory.  Patient has  encephalopathy most likely due to acute illness and medications.   No obvious deficits noted.  History of non-small cell lung cancer and COPD She is status post right lower lobe lobectomy and has completed 4 cycles of adjuvant chemotherapy.  Lung cancer appears to be in remission.    History of gout Initially it was thought that patient is on chronic prednisone at home but review of previous notes did not mention prednisone.  Discussed on steroids with patient's son.  Apparently over the last 1 to 2 months she has had issues with acute gout for which she was prescribed courses of steroids.  She does not take steroid on a regular basis.  However she was on a course of prednisone at the time of admission.  Nutrition Speech therapy to evaluate tube feedings on hold currently  Goals of care Patient appears to have taken a turn for the worse. Previous MD discussed in detail with patient's son.  He is agreeable to DNR currently.  He does want to pursue CRRT and dialysis if that is an option.  He understands that patient is getting worse.  He understands that she may not survive this hospitalization.  DVT Prophylaxis: Heparin changed over to SCDs PUD Prophylaxis: Protonix Code Status: DNR Family Communication: discuss with son on daily basis. Disposition Plan: Will remain in ICU  Reason for Visit: Acute respiratory disease due to COVID-19  Consultants: Pulmonology  Procedures:  Intubation 7/10 Central line placement right IJ 7/10  Antibiotics: Anti-infectives (From admission, onward)   Start     Dose/Rate Route Frequency Ordered Stop   05/19/19 1030  piperacillin-tazobactam (ZOSYN) IVPB 2.25 g     2.25 g 100 mL/hr over 30 Minutes Intravenous Every 8 hours 05/19/19 1023     05/15/19 1230  remdesivir 100 mg in sodium chloride 0.9 % 250 mL IVPB     100 mg 500 mL/hr over 30 Minutes Intravenous Every 24 hours 05/14/19 1148 05/18/19 1340   05/14/19 1230  remdesivir 200 mg in sodium chloride  0.9 % 250 mL IVPB     200 mg 500 mL/hr over 30 Minutes Intravenous Once 05/14/19 1148 05/14/19 1551        Medications:  Scheduled: . chlorhexidine  15 mL Mouth/Throat BID  . Chlorhexidine Gluconate Cloth  6 each Topical Daily  . docusate  100 mg Oral BID  . enoxaparin (LOVENOX) injection  1 mg/kg Subcutaneous Q24H  . feeding supplement (PRO-STAT SUGAR FREE 64)  30 mL Per Tube BID  . free water  400 mL Per Tube Q6H  . insulin aspart  0-20 Units Subcutaneous Q4H  . insulin aspart  6 Units Subcutaneous Q4H  . insulin detemir  15 Units Subcutaneous BID  . mouth rinse  15 mL Mouth Rinse 10 times per day  . methylPREDNISolone (SOLU-MEDROL) injection  20 mg Intravenous Q24H  . pantoprazole (PROTONIX) IV  40 mg Intravenous Q12H  . QUEtiapine  50 mg Oral BID  . sodium chloride flush  3 mL Intravenous Q12H   Continuous: . sodium chloride Stopped (05/25/19 0547)  . dexmedetomidine (PRECEDEX) IV infusion 0.9 mcg/kg/hr (  05/25/19 1342)  . dextrose Stopped (05/20/19 1431)  . feeding supplement (VITAL AF 1.2 CAL) 50 mL/hr at 05/24/19 0000  . fentaNYL infusion INTRAVENOUS 75 mcg/hr (05/25/19 0600)  . piperacillin-tazobactam (ZOSYN)  IV 100 mL/hr at 05/25/19 0600   BMW:UXLKGM chloride, bisacodyl, fentaNYL, ipratropium-albuterol, midazolam, ondansetron **OR** ondansetron (ZOFRAN) IV, polyethylene glycol   Objective:  Vital Signs  Vitals:   05/25/19 0900 05/25/19 1000 05/25/19 1100 05/25/19 1200  BP: 124/62 (!) 117/54 (!) 152/65 (!) 154/74  Pulse: 65 76 (!) 104 (!) 127  Resp: (!) 26 19 (!) 30 (!) 27  Temp: 99 F (37.2 C) 98.8 F (37.1 C) 98.6 F (37 C) 99.3 F (37.4 C)  TempSrc:      SpO2: 97% 96% 97% 96%  Weight:      Height:        Intake/Output Summary (Last 24 hours) at 05/25/2019 1357 Last data filed at 05/25/2019 1200 Gross per 24 hour  Intake 3684.86 ml  Output 3150 ml  Net 534.86 ml   Filed Weights   05/23/19 2300 05/24/19 0500 05/25/19 0500  Weight: 61.1 kg 61.1  kg 62 kg   Sedated, intubated, in no apparent distress Symmetrical Chest wall movement, Good air movement bilaterally, CTAB RRR,No Gallops,Rubs or new Murmurs, No Parasternal Heave +ve B.Sounds, Abd Soft, No tenderness, No rebound - guarding or rigidity. No Cyanosis, Clubbing or edema, No new Rash or bruise        Lab Results:  Data Reviewed: I have personally reviewed following labs and imaging studies  CBC: Recent Labs  Lab 05/21/19 0500 05/22/19 0500 05/23/19 0500 05/24/19 0500 05/25/19 0500  WBC 17.5* 15.0* 11.3* 10.7* 11.3*  HGB 8.2* 7.5* 9.3* 9.9* 9.8*  HCT 26.0* 23.6* 28.7* 30.0* 30.7*  MCV 92.9 93.3 92.3 92.9 93.3  PLT 212 181 153 158 010    Basic Metabolic Panel: Recent Labs  Lab 05/18/19 1730 05/18/19 1845  05/19/19 0430  05/19/19 1700 05/20/19 0115  05/21/19 0500 05/22/19 0500 05/23/19 0500 05/24/19 0500 05/25/19 0500  NA 158*  --    < > 156*   < >  --  153*   < > 151* 147* 144 144 142  K 3.6  --    < > 3.9   < >  --  4.2   < > 4.8 4.6 4.7 4.3 4.5  CL 122*  --   --  117*  --   --  119*  --  119* 118* 115* 113* 112*  CO2 21*  --   --  18*  --   --  23  --  22 18* 20* 22 21*  GLUCOSE 84  --   --  27*  --   --  203*  --  50* 143* 116* 97 57*  BUN 96*  --   --  99*  --   --  93*  --  85* 91* 94* 90* 93*  CREATININE 2.75*  --   --  3.25*  --   --  3.18*  --  3.26* 3.14* 2.74* 2.38* 2.31*  CALCIUM 9.0  --   --  8.9  --   --  8.0*  --  8.5* 8.3* 8.6* 8.7* 8.7*  MG 2.5* 2.4  --  1.9  --  2.0 2.1  --  2.3 2.2 2.1 2.0  --   PHOS 2.1* 3.2  --  2.8  --  5.2* 5.3*  --   --   --   --   --   --    < > =  values in this interval not displayed.    GFR: Estimated Creatinine Clearance: 18.5 mL/min (A) (by C-G formula based on SCr of 2.31 mg/dL (H)).  Liver Function Tests: Recent Labs  Lab 05/21/19 0500 05/22/19 0500 05/23/19 0500 05/24/19 0500 05/25/19 0500  AST 31 42* 37 39 38  ALT 17 20 21 23 26   ALKPHOS 70 69 57 60 55  BILITOT 0.5 0.3 0.5 0.4 0.3   PROT 4.9* 5.0* 5.1* 5.2* 5.2*  ALBUMIN 2.4* 2.4* 2.4* 2.4* 2.4*    Cardiac Enzymes: No results for input(s): CKTOTAL, CKMB, CKMBINDEX, TROPONINI in the last 168 hours.  CBG: Recent Labs  Lab 05/25/19 0504 05/25/19 0549 05/25/19 0801 05/25/19 1029 05/25/19 1156  GLUCAP 58* 127* 139* 124* 137*    Anemia Panel: No results for input(s): VITAMINB12, FOLATE, FERRITIN, TIBC, IRON, RETICCTPCT in the last 72 hours.  Recent Results (from the past 240 hour(s))  MRSA PCR Screening     Status: None   Collection Time: 05/18/19  5:16 AM   Specimen: Nasal Mucosa; Nasopharyngeal  Result Value Ref Range Status   MRSA by PCR NEGATIVE NEGATIVE Final    Comment:        The GeneXpert MRSA Assay (FDA approved for NASAL specimens only), is one component of a comprehensive MRSA colonization surveillance program. It is not intended to diagnose MRSA infection nor to guide or monitor treatment for MRSA infections. Performed at [facility], [place] [address], [place], [place] [address]   Culture, respiratory (non-expectorated)     Status: None   Collection Time: 05/22/19  5:00 AM   Specimen: Tracheal Aspirate; Respiratory  Result Value Ref Range Status   Specimen Description   Final    TRACHEAL ASPIRATE Performed at [facility] [address] [place], [place] [address]    Special Requests   Final    NONE Performed at [facility], [place] [address], [place], [place] [address]    Gram Stain   Final    MODERATE WBC PRESENT,BOTH PMN AND MONONUCLEAR ABUNDANT GRAM POSITIVE COCCI IN PAIRS IN CLUSTERS MODERATE GRAM VARIABLE ROD    Culture   Final    FEW Consistent with normal respiratory flora. Performed at [facility], [place] [address] [place], [place] [address]    Report Status 05/24/2019 FINAL  Final  Culture, Urine     Status: None   Collection Time: 05/22/19  5:00 AM   Specimen: Urine, Clean Catch  Result Value Ref Range  Status   Specimen Description   Final    URINE, CLEAN CATCH Performed at [facility], [place] [address] [place], [place] [address]    Special Requests   Final    NONE Performed at [facility], [place] [address], [place], [place] [address]    Culture   Final    NO GROWTH Performed at [facility], [place] [address] [place],  [address]    Report Status 05/23/2019 FINAL  Final      Radiology Studies: Dg Chest Port 1 View  Result Date: 05/25/2019 CLINICAL DATA:  Intubation. EXAM: PORTABLE CHEST 1 VIEW COMPARISON:  05/22/2019. FINDINGS: Endotracheal tube, NG tube, right IJ line in stable position. AICD in stable position. Stable cardiomegaly. Unchanged bibasilar infiltrates/edema. No pleural effusion or pneumothorax. IMPRESSION: 1.  Lines and tubes stable position. 2. AICD in stable position. Stable cardiomegaly. Bibasilar infiltrates/edema unchanged. Electronically Signed   By: Marcello Moores  Register   On:  05/25/2019 07:45       LOS: 11 days   Phillips Climes MD  [facility] Pager on [email or web address]  05/25/2019, 1:57 PM

## 2019-05-25 NOTE — Progress Notes (Signed)
NAME:  Betty [name], MRN:  [id number], DOB:  [birth year], LOS: 11 ADMISSION DATE:  05/13/2019, CONSULTATION DATE: July 10 REFERRING MD: Cruzita Lederer, CHIEF COMPLAINT: Dyspnea  Brief History   73 yo female presented with altered mental status and weakness from COVID pneumonia.  Developed worsening hypoxia and ventilator status requiring intubation.  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% from Echo June 2020  [facility] Events   7/09 admission, start steroids and remdesivir 7/10 intubated, start actemera 7/13 Extubated >> reintubated due to altered mental status, hypotension and A fib with RVR; start amiodarone and pressors; complete remdesivir 7/14 Start ABX 7/15 Fever 101.6; stop amiodarone 7/17 transfuse PRBC with concern for lower GI bleed 7/18 Off pressors  Consults:  Nephrology 7/14 AKI with CKD >> s/o 7/18  Procedures:  ETT 7/10 >> 7/13 Rt IJ CVL 7/10 >>  ETT 7/14 >>  Significant Diagnostic Tests:  04/22/19 LVEF 30-35%, LVH, LV hypokinesis, RV normal function, minimal valve insufficiencies CT head 05/14/19 > stable Rt MCA bifurcation aneurysm, atrophy, white matter disease  Micro Data:  SARS Coronavirus 2 7/09 > positive Urine 7/09 > negative Urine 7/17 > negative Sputum 7/17 > negative  Antimicrobials:  Remdesivir 7/09 > 7/13 Zosyn 7/14 > Vancomycin 7/17 >   Interim history/subjective:  Pressure support.  Remains on sedation.  Objective   Blood pressure (!) 152/65, pulse (!) 104, temperature 98.6 F (37 C), resp. rate (!) 30, height 5\' 4"  (1.626 m), weight 62 kg, SpO2 97 %. CVP:  [6 mmHg-9 mmHg] 6 mmHg  Vent Mode: PCV FiO2 (%):  [40 %] 40 % Set Rate:  [24 bmp] 24 bmp PEEP:  [5 cmH20] 5 cmH20   Intake/Output Summary (Last 24 hours) at 05/25/2019 1138 Last data filed at 05/25/2019 1032 Gross per 24 hour  Intake 3566 ml  Output 2900 ml  Net 666 ml   Filed Weights   05/23/19 2300 05/24/19  0500 05/25/19 0500  Weight: 61.1 kg 61.1 kg 62 kg   Physical Exam:  General - sedated Eyes - pupils reactive ENT - ETT in place Cardiac - irregular Chest - decreased BS, no wheeze Abdomen - soft, non tender, + bowel sounds Extremities - no edema Skin - no rashes Neuro - RASS -3  CXR (reviewed by me) - faint basilar ASD   Resolved Hospital Problem list   Septic shock, AKI from ATN  Assessment & Plan:   Acute on chronic hypoxic respiratory failure from COVID Pneumonia. Hx of COPD. - pressure support - will need to clarify with family about goals of care >> one way extubation versus extubation trial and if she fails again, then proceed with trach - f/u CXR intermittently - likely can d/c solumedrol soon - prn BDs  Acute metabolic encephalopathy. Hx of dementia. - RASS goal -1 to -2 - continue seroquel - hold outpt aricept  Fever. - cultures negative - procalcitonin trending down - might be able to d/c ABx soon  New onset A fib with RVR. Chronic combined CHF, HLD. - hold outpt bidil, ASA, lipitor, lasix, toprol  CKD 4 >> baseline creatinine 2.3 to 2.6. - f/u BMET - monitor renal fx, urine outpt  Hyperglycemia. - SSI with levemir  Best practice:  Diet: tube feeding DVT prophylaxis: Lovenox GI prophylaxis: protonix Mobility: bed rest Code Status: DNR  Disposition: ICU  Labs:   CMP Latest Ref Rng & Units 05/25/2019 05/24/2019 05/23/2019  Glucose 70 - 99 mg/dL 57(L) 97 116(H)  BUN 8 - 23 mg/dL 93(H) 90(H) 94(H)  Creatinine 0.44 - 1.00 mg/dL 2.31(H) 2.38(H) 2.74(H)  Sodium 135 - 145 mmol/L 142 144 144  Potassium 3.5 - 5.1 mmol/L 4.5 4.3 4.7  Chloride 98 - 111 mmol/L 112(H) 113(H) 115(H)  CO2 22 - 32 mmol/L 21(L) 22 20(L)  Calcium 8.9 - 10.3 mg/dL 8.7(L) 8.7(L) 8.6(L)  Total Protein 6.5 - 8.1 g/dL 5.2(L) 5.2(L) 5.1(L)  Total Bilirubin 0.3 - 1.2 mg/dL 0.3 0.4 0.5  Alkaline Phos 38 - 126 U/L 55 60 57  AST 15 - 41 U/L 38 39 37  ALT 0 - 44 U/L 26 23 21     CBC Latest Ref Rng & Units 05/25/2019 05/24/2019 05/23/2019  WBC 4.0 - 10.5 K/uL 11.3(H) 10.7(H) 11.3(H)  Hemoglobin 12.0 - 15.0 g/dL 9.8(L) 9.9(L) 9.3(L)  Hematocrit 36.0 - 46.0 % 30.7(L) 30.0(L) 28.7(L)  Platelets 150 - 400 K/uL 162 158 153   ABG    Component Value Date/Time   PHART 7.409 05/20/2019 0546   PCO2ART 30.6 (L) 05/20/2019 0546   PO2ART 63.0 (L) 05/20/2019 0546   HCO3 19.5 (L) 05/20/2019 0546   TCO2 20 (L) 05/20/2019 0546   ACIDBASEDEF 5.0 (H) 05/20/2019 0546   O2SAT 93.0 05/20/2019 0546   CBG (last 3)  Recent Labs    05/25/19 0801 05/25/19 1029 05/25/19 1156  GLUCAP 139* 124* 137*    CC time 32 minutes  D/w Dr. Waldron Labs  Chesley Mires, MD [place] 05/25/2019, 1:20 PM

## 2019-05-26 LAB — CBC
HCT: 31.6 % — ABNORMAL LOW (ref 36.0–46.0)
Hemoglobin: 10.1 g/dL — ABNORMAL LOW (ref 12.0–15.0)
MCH: 30.2 pg (ref 26.0–34.0)
MCHC: 32 g/dL (ref 30.0–36.0)
MCV: 94.6 fL (ref 80.0–100.0)
Platelets: 174 10*3/uL (ref 150–400)
RBC: 3.34 MIL/uL — ABNORMAL LOW (ref 3.87–5.11)
RDW: 15 % (ref 11.5–15.5)
WBC: 12.3 10*3/uL — ABNORMAL HIGH (ref 4.0–10.5)
nRBC: 0 % (ref 0.0–0.2)

## 2019-05-26 LAB — POCT I-STAT 7, (LYTES, BLD GAS, ICA,H+H)
Acid-base deficit: 6 mmol/L — ABNORMAL HIGH (ref 0.0–2.0)
Acid-base deficit: 5 mmol/L — ABNORMAL HIGH (ref 0.0–2.0)
Bicarbonate: 18.2 mmol/L — ABNORMAL LOW (ref 20.0–28.0)
Bicarbonate: 18 mmol/L — ABNORMAL LOW (ref 20.0–28.0)
Calcium, Ion: 1.34 mmol/L (ref 1.15–1.40)
Calcium, Ion: 1.35 mmol/L (ref 1.15–1.40)
HCT: 31 % — ABNORMAL LOW (ref 36.0–46.0)
HCT: 31 % — ABNORMAL LOW (ref 36.0–46.0)
Hemoglobin: 10.5 g/dL — ABNORMAL LOW (ref 12.0–15.0)
Hemoglobin: 10.5 g/dL — ABNORMAL LOW (ref 12.0–15.0)
O2 Saturation: 95 %
O2 Saturation: 92 %
Patient temperature: 98.1
Potassium: 3.9 mmol/L (ref 3.5–5.1)
Potassium: 3.6 mmol/L (ref 3.5–5.1)
Sodium: 144 mmol/L (ref 135–145)
Sodium: 145 mmol/L (ref 135–145)
TCO2: 19 mmol/L — ABNORMAL LOW (ref 22–32)
TCO2: 19 mmol/L — ABNORMAL LOW (ref 22–32)
pCO2 arterial: 30.7 mmHg — ABNORMAL LOW (ref 32.0–48.0)
pCO2 arterial: 26.9 mmHg — ABNORMAL LOW (ref 32.0–48.0)
pH, Arterial: 7.38 (ref 7.350–7.450)
pH, Arterial: 7.433 (ref 7.350–7.450)
pO2, Arterial: 76 mmHg — ABNORMAL LOW (ref 83.0–108.0)
pO2, Arterial: 61 mmHg — ABNORMAL LOW (ref 83.0–108.0)

## 2019-05-26 LAB — BASIC METABOLIC PANEL
Anion gap: 11 (ref 5–15)
BUN: 97 mg/dL — ABNORMAL HIGH (ref 8–23)
CO2: 21 mmol/L — ABNORMAL LOW (ref 22–32)
Calcium: 8.8 mg/dL — ABNORMAL LOW (ref 8.9–10.3)
Chloride: 110 mmol/L (ref 98–111)
Creatinine, Ser: 2.57 mg/dL — ABNORMAL HIGH (ref 0.44–1.00)
GFR calc Af Amer: 21 mL/min — ABNORMAL LOW (ref >60)
GFR calc non Af Amer: 18 mL/min — ABNORMAL LOW (ref >60)
Glucose, Bld: 94 mg/dL (ref 70–99)
Potassium: 3.7 mmol/L (ref 3.5–5.1)
Sodium: 142 mmol/L (ref 135–145)

## 2019-05-26 LAB — D-DIMER, QUANTITATIVE: D-Dimer, Quant: 3.13 ug/mL-FEU — ABNORMAL HIGH (ref 0.00–0.50)

## 2019-05-26 LAB — GLUCOSE, CAPILLARY
Glucose-Capillary: 124 mg/dL — ABNORMAL HIGH (ref 70–99)
Glucose-Capillary: 66 mg/dL — ABNORMAL LOW (ref 70–99)
Glucose-Capillary: 183 mg/dL — ABNORMAL HIGH (ref 70–99)
Glucose-Capillary: 74 mg/dL (ref 70–99)
Glucose-Capillary: 154 mg/dL — ABNORMAL HIGH (ref 70–99)
Glucose-Capillary: 114 mg/dL — ABNORMAL HIGH (ref 70–99)

## 2019-05-26 LAB — TYPE AND SCREEN
ABO/RH(D): A POS
Antibody Screen: NEGATIVE

## 2019-05-26 MED ORDER — CHLORHEXIDINE GLUCONATE 0.12 % MT SOLN
15.0000 mL | Freq: Two times a day (BID) | OROMUCOSAL | Status: DC
  Administered 2019-05-26 – 2019-05-30 (×8): 15 mL via OROMUCOSAL
  Filled 2019-05-26 (×6): qty 15

## 2019-05-26 MED ORDER — LABETALOL HCL 5 MG/ML IV SOLN: 10.0000 mg | Freq: Once | INTRAVENOUS | Status: DC

## 2019-05-26 MED ORDER — QUETIAPINE FUMARATE 50 MG PO TABS
50.0000 mg | ORAL_TABLET | Freq: Every day | ORAL | Status: DC
  Filled 2019-05-26: qty 2

## 2019-05-26 MED ORDER — PANTOPRAZOLE SODIUM 40 MG IV SOLR
40.0000 mg | INTRAVENOUS | Status: DC
  Administered 2019-05-26 – 2019-06-05 (×11): 40 mg via INTRAVENOUS
  Filled 2019-05-26 (×11): qty 40

## 2019-05-26 MED ORDER — ORAL CARE MOUTH RINSE
15.0000 mL | Freq: Two times a day (BID) | OROMUCOSAL | Status: DC
  Administered 2019-05-26 – 2019-06-12 (×33): 15 mL via OROMUCOSAL

## 2019-05-26 MED ORDER — LORAZEPAM 2 MG/ML IJ SOLN
0.5000 mg | INTRAMUSCULAR | Status: DC | PRN
  Administered 2019-06-02 (×2): 0.5 mg via INTRAVENOUS
  Filled 2019-05-26 (×2): qty 1

## 2019-05-26 MED ORDER — FENTANYL CITRATE (PF) 100 MCG/2ML IJ SOLN: 12.5000 ug | INTRAMUSCULAR | Status: DC | PRN

## 2019-05-26 MED ORDER — DEXTROSE 50 % IV SOLN
INTRAVENOUS | Status: AC
  Administered 2019-05-26: 50 mL
  Filled 2019-05-26: qty 50

## 2019-05-26 MED ORDER — INSULIN DETEMIR 100 UNIT/ML ~~LOC~~ SOLN
10.0000 [IU] | Freq: Every day | SUBCUTANEOUS | Status: DC
  Administered 2019-05-27 – 2019-06-04 (×9): 10 [IU] via SUBCUTANEOUS
  Filled 2019-05-26 (×10): qty 0.1

## 2019-05-26 NOTE — Procedures (Signed)
Extubation Procedure Note  Patient Details:   Name: Betty [name] DOB: [birth year] MRN: [id number]   Airway Documentation:    Vent end date: 05/26/19 Vent end time: 0910   Evaluation  O2 sats: stable throughout Complications: No apparent complications Patient did tolerate procedure well. Bilateral Breath Sounds: Diminished   No   Pt extubated to 15 L HFNC per MD order. Pt stable throughout with no complications.VS within normal limits. Pt with slight increased WOB, RT will monitor closely.   Jesse Sans 05/26/2019, 9:23 AM

## 2019-05-26 NOTE — Progress Notes (Signed)
Blood sugar = 66. D50 given per hypoglycemia protocol. Dr. Waldron Labs notified.

## 2019-05-26 NOTE — Progress Notes (Signed)
 Nutrition Follow-up   RD working remotely.  DOCUMENTATION CODES:   Not applicable  INTERVENTION:   If unable to advance diet, recommend insertion of Cortrak tube/NG tube with initiation of TF Cortrak service available every Wednesday; next service available 7/22 (tomorrow)  Tube Feeding:  Osmolite 1.2 at 55 ml/hr Pro-Stat 30 mL Provides 88 g of protein, 1684 kcals and 1069 mL free water Meets 100% calorie and protein needs  NUTRITION DIAGNOSIS:   Inadequate oral intake related to acute illness as evidenced by NPO status.  Continues   GOAL:   Patient will meet greater than or equal to 90% of their needs  Not Met   MONITOR:   Vent status, TF tolerance, Labs, Weight trends, Skin  REASON FOR ASSESSMENT:   Consult, Ventilator Enteral/tube feeding initiation and management  ASSESSMENT:   73 yo female admitted with severe acute respiratory failure from COVID-19 pneumonia requiring intubation, AKI/CKD IV.  PMH includes COPD, CKD IV, CHF, dementia, HTN, HLD, non-small cell lung ca in 2016, CAD.  7/09 Admit 7/10 Transferred to ICU and Intubated 7/13 Extubated; Re-Intubated 7/21 Extubated  NPO, SLP to evaluate. Noted plan for possible Cortrak if unable to advance diet  Admit wt 57 kg; current wt 59.7 kg  Labs: Creatinine 2.57, BUN 97, CBGs 66-183 Meds: ss novolog, levemir  Diet Order:   Diet Order            Diet NPO time specified  Diet effective now              EDUCATION NEEDS:   Not appropriate for education at this time  Skin:  Skin Assessment: Reviewed RN Assessment  Last BM:  7/21 rectal tube  Height:   Ht Readings from Last 1 Encounters:  05/18/19 '5\' 4"'$  (1.626 m)    Weight:   Wt Readings from Last 1 Encounters:  05/26/19 59.7 kg    Ideal Body Weight:  54.5 kg  BMI:  Body mass index is 22.59 kg/m.  Estimated Nutritional Needs:   Kcal:  1650-1850 kcals  Protein:  83-93 g  Fluid:  >/= 1.7 L   Raine Elsass MS, RDN, LDN,  CNSC [phone] Pager  [phone] Weekend/On-Call Pager

## 2019-05-26 NOTE — Progress Notes (Signed)
50 ml of Fentanyl wasted. Discontinued order after extubation. Witnessed by Lyla Son RN.

## 2019-05-26 NOTE — Progress Notes (Signed)
PROGRESS NOTE  Betty [name] UVO:[id number] DOB: 28-May-1946 DOA: 05/13/2019  PCP: Josetta Huddle, MD  Brief History/Interval Summary:   - 73 year old female with history of coronary artery disease, non-small cell lung cancer in 2016, hypertension, hyperlipidemia, dementia, COPD, chronic kidney disease stage IV, chronic combined CHF who was brought to the hospital by the patient's son due to increased confusion and weakness.  She was diagnosed with coronavirus in the ED on 7/9.  She was also found to be hypoxic requiring 2 L nasal cannula.  She was admitted to the hospital and placed on steroids along with Remdesivir, deferred to ICU on 7/10, where she required intubation, she was extubated on 7/13, where she was reintubated that same day due to encephalopathy, was extubated 7/21  Subjective/Interval History:   As discussed with staff, no significant events overnight, some hypothermic readings, but they do appear to be an error  Assessment/Plan:  Acute Hypoxic Resp. Failure due to Acute Covid 19 Viral Illness  Vent Mode: PCV FiO2 (%):  [30 %-40 %] 30 % Set Rate:  [24 bmp] 24 bmp PEEP:  [5 cmH20] 5 cmH20 Plateau Pressure:  [11 cmH20] 11 cmH20     Component Value Date/Time   PHART 7.433 05/26/2019 1054   PCO2ART 26.9 (L) 05/26/2019 1054   PO2ART 61.0 (L) 05/26/2019 1054   HCO3 18.0 (L) 05/26/2019 1054   TCO2 19 (L) 05/26/2019 1054   ACIDBASEDEF 5.0 (H) 05/26/2019 1054   O2SAT 92.0 05/26/2019 1054    COVID-19 Labs  Recent Labs    05/24/19 0500 05/25/19 0500 05/26/19 0400  DDIMER 3.75* 3.41* 3.13*  CRP 2.0* 1.1*  --     Lab Results  Component Value Date   SARSCOV2NAA POSITIVE (A) 05/14/2019      Antibiotics: Started on Zosyn 7/14.  Vancomycin started 7/17 Remdesivir: Completed course of Remdesivir Steroids: Solu-Medrol  Diuretics: Given Lasix x1 on 7/10.  Not continued due to increase in creatinine Actemra: Actemra x1 on 7/10 Convalescent Plasma: Not given yet  Vitamin C and Zinc: Continue DVT Prophylaxis: lovenox  -Required reintubation in the past, she was tolerating pressure support earlier today, she was extubated . -Discussed plan with son, if patient fails extubation this time, plan will be for reintubation, with trach placement this time to allow for better vent liberation with tracheostomy . -Off sedation .  Sepsis -Fever on 7/15, with elevated procalcitonin, requiring pressors, currently weaned off pressors. -Has some increased secretion, questionable HCAP, treated with vancomycin and Zosyn  Shock Likely a combination of septic as well as cardiogenic shock.  She does have cool extremities.  Venous blood gas shows saturation of 75.    Atrial fibrillation with RVR - She was on amiodarone drip, currently rate controlled, paced rhythm, on  Lovenox treatment dose.  Lower GI bleed -Patient with blood in her stools, gastric lavage with no evidence of blood, she required 1 unit PRBC transfusion 7/17 with good response, globin is 9.9 this morning. -Monitor closely as she is on full anticoagulation.  Hypernatremia Improving on free water via OGT   Chronic systolic CHF Most recent echocardiogram done in June 2020 showed a EF of 30 to 35%.  Chest x-ray did raise concern for pulmonary edema.  However patient is currently hypotensive.  Acute on chronic kidney disease stage IV Baseline creatinine around 2.0-3.0.  Patient with worsening renal function, currently has stabilized, remains with good urine output, no indication for CRRT per renal .  Hyperglycemia due to steroids Elevated CBGs are  due to steroids.  She has been on multiple courses of prednisone in the last 1 to 2 months for acute gout which is the likely reason for her hyperglycemia.  And also likely reason for her elevated HbA1c.  Continue long-acting insulin and SSI for now.  CBG should improve as steroid is tapered down.     History of dementia/acute metabolic encephalopathy Per  discussion with her son, appears to be mild dementia.  Patient is able to live independently.  Apparently has poor short-term memory.  Patient has encephalopathy most likely due to acute illness and medications.   No obvious deficits noted.  History of non-small cell lung cancer and COPD She is status post right lower lobe lobectomy and has completed 4 cycles of adjuvant chemotherapy.  Lung cancer appears to be in remission.    History of gout Initially it was thought that patient is on chronic prednisone at home but review of previous notes did not mention prednisone.  Discussed on steroids with patient's son.  Apparently over the last 1 to 2 months she has had issues with acute gout for which she was prescribed courses of steroids.  She does not take steroid on a regular basis.  However she was on a course of prednisone at the time of admission.  Nutrition Speech therapy to evaluate tube feedings on hold currently  Goals of care Patient appears to have taken a turn for the worse. Previous MD discussed in detail with patient's son.  He is agreeable to DNR currently.  He does want to pursue CRRT and dialysis if that is an option.   DVT Prophylaxis: Heparin changed over to SCDs PUD Prophylaxis: Protonix Code Status: Partial, to be intubated if needed Family Communication: discuss with son on daily basis. Disposition Plan: Will remain in ICU  Reason for Visit: Acute respiratory disease due to COVID-19  Consultants: Pulmonology  Procedures:  Intubation 7/10 Central line placement right IJ 7/10  Antibiotics: Anti-infectives (From admission, onward)   Start     Dose/Rate Route Frequency Ordered Stop   05/19/19 1030  piperacillin-tazobactam (ZOSYN) IVPB 2.25 g     2.25 g 100 mL/hr over 30 Minutes Intravenous Every 8 hours 05/19/19 1023     05/15/19 1230  remdesivir 100 mg in sodium chloride 0.9 % 250 mL IVPB     100 mg 500 mL/hr over 30 Minutes Intravenous Every 24 hours 05/14/19 1148  05/18/19 1340   05/14/19 1230  remdesivir 200 mg in sodium chloride 0.9 % 250 mL IVPB     200 mg 500 mL/hr over 30 Minutes Intravenous Once 05/14/19 1148 05/14/19 1551        Medications:  Scheduled: . chlorhexidine  15 mL Mouth Rinse BID  . Chlorhexidine Gluconate Cloth  6 each Topical Daily  . docusate  100 mg Oral BID  . enoxaparin (LOVENOX) injection  1 mg/kg Subcutaneous Q24H  . insulin aspart  0-20 Units Subcutaneous Q4H  . insulin detemir  10 Units Subcutaneous Daily  . mouth rinse  15 mL Mouth Rinse q12n4p  . pantoprazole (PROTONIX) IV  40 mg Intravenous Q24H  . QUEtiapine  50 mg Oral QHS  . sodium chloride flush  3 mL Intravenous Q12H   Continuous: . sodium chloride 10 mL/hr at 05/26/19 0900  . dextrose 40 mL/hr (05/26/19 1313)   JAS:NKNLZJ chloride, bisacodyl, fentaNYL (SUBLIMAZE) injection, ipratropium-albuterol, LORazepam, ondansetron **OR** ondansetron (ZOFRAN) IV, polyethylene glycol   Objective:  Vital Signs  Vitals:   05/26/19 0800 05/26/19  0900 05/26/19 0910 05/26/19 1129  BP: 125/64 131/68  127/64  Pulse: 76 100  85  Resp: 18 (!) 25  17  Temp: (!) 97.4 F (36.3 C)   (!) 88.7 F (31.5 C)  TempSrc: Oral     SpO2: 97% 100% 99% 97%  Weight:      Height:        Intake/Output Summary (Last 24 hours) at 05/26/2019 1332 Last data filed at 05/26/2019 0900 Gross per 24 hour  Intake 2142.57 ml  Output 1500 ml  Net 642.57 ml   Filed Weights   05/24/19 0500 05/25/19 0500 05/26/19 0500  Weight: 61.1 kg 62 kg 59.7 kg    Was seen and examined before extubation today  Sedated, intubated, in no apparent distress Creased breath sounds bilaterally, no wheezing, scattered rhonchi Regular, no rubs murmurs or gallops Abdomen soft, nontender, bowel sounds present Extremities with no edema, clubbing or cyanosis      Lab Results:  Data Reviewed: I have personally reviewed following labs and imaging studies  CBC: Recent Labs  Lab 05/22/19 0500  05/23/19 0500 05/24/19 0500 05/25/19 0500 05/26/19 0400 05/26/19 0452 05/26/19 1054  WBC 15.0* 11.3* 10.7* 11.3* 12.3*  --   --   HGB 7.5* 9.3* 9.9* 9.8* 10.1* 10.5* 10.5*  HCT 23.6* 28.7* 30.0* 30.7* 31.6* 31.0* 31.0*  MCV 93.3 92.3 92.9 93.3 94.6  --   --   PLT 181 153 158 162 174  --   --     Basic Metabolic Panel: Recent Labs  Lab 05/19/19 1700  05/20/19 0115  05/21/19 0500 05/22/19 0500 05/23/19 0500 05/24/19 0500 05/25/19 0500 05/26/19 0400 05/26/19 0452 05/26/19 1054  NA  --   --  153*   < > 151* 147* 144 144 142 142 144 145  K  --   --  4.2   < > 4.8 4.6 4.7 4.3 4.5 3.7 3.9 3.6  CL  --    < > 119*  --  119* 118* 115* 113* 112* 110  --   --   CO2  --    < > 23  --  22 18* 20* 22 21* 21*  --   --   GLUCOSE  --    < > 203*  --  50* 143* 116* 97 57* 94  --   --   BUN  --    < > 93*  --  85* 91* 94* 90* 93* 97*  --   --   CREATININE  --    < > 3.18*  --  3.26* 3.14* 2.74* 2.38* 2.31* 2.57*  --   --   CALCIUM  --    < > 8.0*  --  8.5* 8.3* 8.6* 8.7* 8.7* 8.8*  --   --   MG 2.0  --  2.1  --  2.3 2.2 2.1 2.0  --   --   --   --   PHOS 5.2*  --  5.3*  --   --   --   --   --   --   --   --   --    < > = values in this interval not displayed.    GFR: Estimated Creatinine Clearance: 16.6 mL/min (A) (by C-G formula based on SCr of 2.57 mg/dL (H)).  Liver Function Tests: Recent Labs  Lab 05/21/19 0500 05/22/19 0500 05/23/19 0500 05/24/19 0500 05/25/19 0500  AST 31 42* 37 39 38  ALT 17 20  21 23 26   ALKPHOS 70 69 57 60 55  BILITOT 0.5 0.3 0.5 0.4 0.3  PROT 4.9* 5.0* 5.1* 5.2* 5.2*  ALBUMIN 2.4* 2.4* 2.4* 2.4* 2.4*    Cardiac Enzymes: No results for input(s): CKTOTAL, CKMB, CKMBINDEX, TROPONINI in the last 168 hours.  CBG: Recent Labs  Lab 05/25/19 2322 05/26/19 0419 05/26/19 0808 05/26/19 0833 05/26/19 1128  GLUCAP 151* 124* 66* 183* 74    Anemia Panel: No results for input(s): VITAMINB12, FOLATE, FERRITIN, TIBC, IRON, RETICCTPCT in the last 72 hours.   Recent Results (from the past 240 hour(s))  MRSA PCR Screening     Status: None   Collection Time: 05/18/19  5:16 AM   Specimen: Nasal Mucosa; Nasopharyngeal  Result Value Ref Range Status   MRSA by PCR NEGATIVE NEGATIVE Final    Comment:        The GeneXpert MRSA Assay (FDA approved for NASAL specimens only), is one component of a comprehensive MRSA colonization surveillance program. It is not intended to diagnose MRSA infection nor to guide or monitor treatment for MRSA infections. Performed at [facility], [place] [address], [place], [place] [address]   Culture, respiratory (non-expectorated)     Status: None   Collection Time: 05/22/19  5:00 AM   Specimen: Tracheal Aspirate; Respiratory  Result Value Ref Range Status   Specimen Description   Final    TRACHEAL ASPIRATE Performed at [place] [address] [place], [place] [address]    Special Requests   Final    NONE Performed at [facility] [place] [address], [place], [place] [address]    Gram Stain   Final    MODERATE WBC PRESENT,BOTH PMN AND MONONUCLEAR ABUNDANT GRAM POSITIVE COCCI IN PAIRS IN CLUSTERS MODERATE GRAM VARIABLE ROD    Culture   Final    FEW Consistent with normal respiratory flora. Performed at [facility], [place] [address] [place], [place] [address]    Report Status 05/24/2019 FINAL  Final  Culture, Urine     Status: None   Collection Time: 05/22/19  5:00 AM   Specimen: Urine, Clean Catch  Result Value Ref Range Status   Specimen Description   Final    URINE, CLEAN CATCH Performed at [facility], [place] [address], [place], [place] [address]    Special Requests   Final    NONE Performed at [facility], [place] [address], [place], [place] [address]    Culture   Final    NO GROWTH Performed at [facility], [place] [address], [place], [place] [address]    Report Status 05/23/2019 FINAL   Final      Radiology Studies: Dg Chest Port 1 View  Result Date: 05/25/2019 CLINICAL DATA:  Intubation. EXAM: PORTABLE CHEST 1 VIEW COMPARISON:  05/22/2019. FINDINGS: Endotracheal tube, NG tube, right IJ line in stable position. AICD in stable position. Stable cardiomegaly. Unchanged bibasilar infiltrates/edema. No pleural effusion or pneumothorax. IMPRESSION: 1.  Lines and tubes stable position. 2. AICD in stable position. Stable cardiomegaly. Bibasilar infiltrates/edema unchanged. Electronically Signed   By: Marcello Moores  Register   On: 05/25/2019 07:45       LOS: 79 days   Phillips Climes MD  [facility] Pager on [email or web address]  05/26/2019, 1:32 PM

## 2019-05-26 NOTE — Progress Notes (Signed)
Called son to give update. Attempted twice but no answer. Voicemail left to give update of pt.

## 2019-05-26 NOTE — Progress Notes (Signed)
Hypoglycemic Event  CBG: 66  Treatment: D50 25 mL (12.5 gm)  Symptoms: None  Follow-up CBG: [id number] CBG Result:183  Possible Reasons for Event: Unknown  Comments/MD notified: Dr. Waldron Labs notified. Per MD, hold all insulin for this AM. Start D10 at 30 ml/hr. Hold tube feeds in preparation for extubation today. Per Dr. Halford Chessman, hold all sedation at this time.

## 2019-05-26 NOTE — Progress Notes (Signed)
NAME:  Betty [name], MRN:  [id number], DOB:  02-May-1946, LOS: 12 ADMISSION DATE:  05/13/2019, CONSULTATION DATE: July 10 REFERRING MD: Cruzita Lederer, CHIEF COMPLAINT: Dyspnea  Brief History   73 yo female presented with altered mental status and weakness from COVID pneumonia.  Developed worsening hypoxia and ventilator status requiring intubation.  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% from [place] June 2020  [place] [facility] Events   7/09 admission, start steroids and remdesivir 7/10 intubated, start actemera 7/13 Extubated >> reintubated due to altered mental status, hypotension and A fib with RVR; start amiodarone and pressors; complete remdesivir 7/14 Start ABX 7/15 Fever 101.6; stop amiodarone 7/17 transfuse PRBC with concern for lower GI bleed 7/18 Off pressors 7/20 d/c steroids 7/21 Extubated  Consults:  Nephrology 7/14 AKI with CKD >> s/o 7/18  Procedures:  ETT 7/10 >> 7/13 Rt IJ CVL 7/10 >>  ETT 7/14 >> 7/21  Significant Diagnostic Tests:  04/22/19 LVEF 30-35%, LVH, LV hypokinesis, RV normal function, minimal valve insufficiencies CT head 05/14/19 > stable Rt MCA bifurcation aneurysm, atrophy, white matter disease  Micro Data:  SARS Coronavirus 2 7/09 > positive Urine 7/09 > negative Urine 7/17 > negative Sputum 7/17 > negative  Antimicrobials:  Remdesivir 7/09 > 7/13 Zosyn 7/14 > 7/20 Vancomycin 7/17  Interim history/subjective:  Pressure support this AM.  Objective   Blood pressure (!) 105/58, pulse 60, temperature (!) 93 F (33.9 C), resp. rate (!) 31, height 5\' 4"  (1.626 m), weight 59.7 kg, SpO2 99 %. CVP:  [6 mmHg] 6 mmHg  Vent Mode: PCV FiO2 (%):  [30 %-40 %] 30 % Set Rate:  [24 bmp] 24 bmp PEEP:  [5 cmH20] 5 cmH20 Plateau Pressure:  [11 cmH20] 11 cmH20   Intake/Output Summary (Last 24 hours) at 05/26/2019 0947 Last data filed at 05/26/2019 0800 Gross per 24 hour  Intake 2782.99 ml   Output 2025 ml  Net 757.99 ml   Filed Weights   05/24/19 0500 05/25/19 0500 05/26/19 0500  Weight: 61.1 kg 62 kg 59.7 kg   Physical Exam:  General - sleepy Eyes - pupils reactive ENT - ETT in place Cardiac - irregular, no murmur Chest - decreased BS, no wheeze, scattered rhonchi Abdomen - soft, non tender, + bowel sounds Extremities - no cyanosis, clubbing, or edema Skin - no rashes Neuro - intermittently anxious, follows commands appropriately   Resolved Hospital Problem list   Septic shock, AKI from ATN  Assessment & Plan:   Acute on chronic hypoxic respiratory failure from COVID Pneumonia. Hx of COPD. - extubate - family would be okay with reintubation and the set up trach if needed - goal SpO2 > 88% - incentive spirometry - mobilize as able - f/u CXR intermittently - prn BDs  Acute metabolic encephalopathy. Hx of dementia >> has short term memory difficulty, but otherwise was living independently prior to admission. - change seroquel to 50 mg qhs - hold outpt aricept for now - low dose prn ativan, fentanyl  Fever >> none since 7/20. - monitor off ABx  New onset A fib with RVR. Chronic combined CHF, HLD. - hold outpt bidil, ASA, lipitor, lasix, torpol for now  CKD 4 >> baseline creatinine 2.3 to 2.6. - goal even to slightly negative fluid balance - f/u BMET, monitor urine outpt  Anemia of critical illness. - f/u CBC - transfuse for Hb > 7  Hyperglycemia. - had episode  of hypoglycemia 7/20 - adjust SSI and levemir while NPO  Dysphagia. - RN to do bedside swallow >> if she fails then get speech therapy assessment +/- cortrak  Deconditioning. - will need PT/OT  Best practice:  Diet: NPO DVT prophylaxis: Lovenox GI prophylaxis: protonix Mobility: bed rest Code Status: Family ok with reintubation and trach if needed.  Otherwise no CPR, no defibrillation. Disposition: ICU  Labs:   CMP Latest Ref Rng & Units 05/26/2019 05/26/2019 05/25/2019   Glucose 70 - 99 mg/dL - 94 57(L)  BUN 8 - 23 mg/dL - 97(H) 93(H)  Creatinine 0.44 - 1.00 mg/dL - 2.57(H) 2.31(H)  Sodium 135 - 145 mmol/L 144 142 142  Potassium 3.5 - 5.1 mmol/L 3.9 3.7 4.5  Chloride 98 - 111 mmol/L - 110 112(H)  CO2 22 - 32 mmol/L - 21(L) 21(L)  Calcium 8.9 - 10.3 mg/dL - 8.8(L) 8.7(L)  Total Protein 6.5 - 8.1 g/dL - - 5.2(L)  Total Bilirubin 0.3 - 1.2 mg/dL - - 0.3  Alkaline Phos 38 - 126 U/L - - 55  AST 15 - 41 U/L - - 38  ALT 0 - 44 U/L - - 26   CBC Latest Ref Rng & Units 05/26/2019 05/26/2019 05/25/2019  WBC 4.0 - 10.5 K/uL - 12.3(H) 11.3(H)  Hemoglobin 12.0 - 15.0 g/dL 10.5(L) 10.1(L) 9.8(L)  Hematocrit 36.0 - 46.0 % 31.0(L) 31.6(L) 30.7(L)  Platelets 150 - 400 K/uL - 174 162   ABG    Component Value Date/Time   PHART 7.380 05/26/2019 0452   PCO2ART 30.7 (L) 05/26/2019 0452   PO2ART 76.0 (L) 05/26/2019 0452   HCO3 18.2 (L) 05/26/2019 0452   TCO2 19 (L) 05/26/2019 0452   ACIDBASEDEF 6.0 (H) 05/26/2019 0452   O2SAT 95.0 05/26/2019 0452   CBG (last 3)  Recent Labs    05/26/19 0419 05/26/19 0808 05/26/19 0833  GLUCAP 124* 66* 183*   CC time 31 minutes  D/w Dr. Tollie Eth, MD [place] 05/26/2019, 9:47 AM

## 2019-05-27 LAB — GLUCOSE, CAPILLARY
Glucose-Capillary: 118 mg/dL — ABNORMAL HIGH (ref 70–99)
Glucose-Capillary: 131 mg/dL — ABNORMAL HIGH (ref 70–99)
Glucose-Capillary: 135 mg/dL — ABNORMAL HIGH (ref 70–99)
Glucose-Capillary: 146 mg/dL — ABNORMAL HIGH (ref 70–99)
Glucose-Capillary: 157 mg/dL — ABNORMAL HIGH (ref 70–99)
Glucose-Capillary: 160 mg/dL — ABNORMAL HIGH (ref 70–99)

## 2019-05-27 LAB — CBC
HCT: 36.9 % (ref 36.0–46.0)
Hemoglobin: 11.9 g/dL — ABNORMAL LOW (ref 12.0–15.0)
MCH: 30.1 pg (ref 26.0–34.0)
MCHC: 32.2 g/dL (ref 30.0–36.0)
MCV: 93.2 fL (ref 80.0–100.0)
Platelets: 211 10*3/uL (ref 150–400)
RBC: 3.96 MIL/uL (ref 3.87–5.11)
RDW: 15.6 % — ABNORMAL HIGH (ref 11.5–15.5)
WBC: 15.2 10*3/uL — ABNORMAL HIGH (ref 4.0–10.5)
nRBC: 0 % (ref 0.0–0.2)

## 2019-05-27 LAB — COMPREHENSIVE METABOLIC PANEL
ALT: 31 U/L (ref 0–44)
AST: 42 U/L — ABNORMAL HIGH (ref 15–41)
Albumin: 2.9 g/dL — ABNORMAL LOW (ref 3.5–5.0)
Alkaline Phosphatase: 59 U/L (ref 38–126)
Anion gap: 11 (ref 5–15)
BUN: 69 mg/dL — ABNORMAL HIGH (ref 8–23)
CO2: 20 mmol/L — ABNORMAL LOW (ref 22–32)
Calcium: 9.2 mg/dL (ref 8.9–10.3)
Chloride: 116 mmol/L — ABNORMAL HIGH (ref 98–111)
Creatinine, Ser: 1.98 mg/dL — ABNORMAL HIGH (ref 0.44–1.00)
GFR calc Af Amer: 28 mL/min — ABNORMAL LOW (ref >60)
GFR calc non Af Amer: 24 mL/min — ABNORMAL LOW (ref >60)
Glucose, Bld: 130 mg/dL — ABNORMAL HIGH (ref 70–99)
Potassium: 3.7 mmol/L (ref 3.5–5.1)
Sodium: 147 mmol/L — ABNORMAL HIGH (ref 135–145)
Total Bilirubin: 0.7 mg/dL (ref 0.3–1.2)
Total Protein: 6 g/dL — ABNORMAL LOW (ref 6.5–8.1)

## 2019-05-27 LAB — HEPARIN ANTI-XA: Heparin LMW: 1.38 IU/mL

## 2019-05-27 LAB — MAGNESIUM: Magnesium: 1.9 mg/dL (ref 1.7–2.4)

## 2019-05-27 LAB — PHOSPHORUS: Phosphorus: 3.9 mg/dL (ref 2.5–4.6)

## 2019-05-27 MED ORDER — PRO-STAT SUGAR FREE PO LIQD
30.0000 mL | Freq: Every day | ORAL | Status: DC
  Administered 2019-05-27 – 2019-06-06 (×11): 30 mL
  Filled 2019-05-27 (×11): qty 30

## 2019-05-27 MED ORDER — LABETALOL HCL 5 MG/ML IV SOLN: 10.0000 mg | INTRAVENOUS | Status: DC | PRN

## 2019-05-27 MED ORDER — OSMOLITE 1.2 CAL PO LIQD
1000.0000 mL | ORAL | Status: DC
  Administered 2019-05-27 – 2019-06-03 (×8): 1000 mL
  Filled 2019-05-27 (×12): qty 1000

## 2019-05-27 MED ORDER — ENOXAPARIN SODIUM 60 MG/0.6ML ~~LOC~~ SOLN
50.0000 mg | SUBCUTANEOUS | Status: DC
  Administered 2019-05-29 – 2019-06-12 (×15): 50 mg via SUBCUTANEOUS
  Filled 2019-05-27 (×17): qty 0.6

## 2019-05-27 MED ORDER — METOPROLOL TARTRATE 25 MG/10 ML ORAL SUSPENSION
25.0000 mg | Freq: Two times a day (BID) | ORAL | Status: DC
  Administered 2019-05-27 – 2019-06-06 (×21): 25 mg
  Filled 2019-05-27 (×25): qty 10

## 2019-05-27 NOTE — Evaluation (Signed)
Clinical/Bedside Swallow Evaluation Patient Details  Name: Betty [name] MRN: [id number] Date of Birth: Jul 21, 1946  Today's Date: 05/27/2019 Time: SLP Start Time (ACUTE ONLY): 4270 SLP Stop Time (ACUTE ONLY): 1342 SLP Time Calculation (min) (ACUTE ONLY): 20 min  Past Medical History:  Past Medical History:  Diagnosis Date  . Acute combined systolic and diastolic (congestive) hrt fail ([place])   . Acute mastitis of right breast 10/2003  . Arthritis    bursitis in right shoulder (05/21/2018)  . 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 CKD (chronic kidney disease), stage IV ([place])   . Colon polyps   . 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 Dementia ([place]) 07/16/2018  . Family history of breast cancer   . GERD (gastroesophageal reflux disease)   . Hypercholesterolemia   . Hypertension   . Non-small cell carcinoma of right lung, stage 1 ([place]) 05/17/2015   Stage IB, right upper lobectomy 04/29/15, chemo   . NSTEMI (non-ST elevated myocardial infarction) ([place]) 03/24/2018  . Presence of permanent cardiac pacemaker 05/21/2018  . Pure hypercholesterolemia   . Sciatica of right side   . Seasonal allergic rhinitis   . Tobacco dependence   . Tubular adenoma of colon   . Vitamin D deficiency    Past Surgical History:  Past Surgical History:  Procedure Laterality Date  . BIV PACEMAKER INSERTION CRT-P N/A 05/21/2018   Procedure: BIV PACEMAKER INSERTION CRT-P;  Surgeon: Deboraha Sprang, MD;  Location: [facility];  Service: Cardiovascular;  Laterality: N/A;  . BREAST SURGERY     small mass removed from left breast--benign  . CRYO INTERCOSTAL NERVE BLOCK Right 04/29/2015   Procedure: CRYO INTERCOSTAL NERVE BLOCK;  Surgeon: [name], MD;  Location: [facility];  Service: Thoracic;  Laterality: Right;  . INSERT / REPLACE / REMOVE PACEMAKER  05/21/2018  . LOBECTOMY Right 04/29/2015   Procedure: RIGHT LOWER LUNG LOBECTOMY ;   Surgeon: [name], MD;  Location: [facility];  Service: Thoracic;  Laterality: Right;  . LYMPH NODE DISSECTION Right 04/29/2015   Procedure: RIGHT LUNG LYMPH NODE DISSECTION;  Surgeon: [name], MD;  Location: [facility];  Service: Thoracic;  Laterality: Right;  . RIGHT/LEFT HEART CATH AND CORONARY ANGIOGRAPHY N/A 04/24/2018   Procedure: RIGHT/LEFT HEART CATH AND CORONARY ANGIOGRAPHY;  Surgeon: [name], MD;  Location: [facility];  Service: Cardiovascular;  Laterality: N/A;  . VAGINAL DELIVERY     51 yrs ago  . VIDEO ASSISTED THORACOSCOPY (VATS)/ LOBECTOMY Right 04/29/2015   Procedure: RIGHT VIDEO ASSISTED THORACOSCOPY (VATS) WEDGE RESECTION/ RIGHT LOWER LOBECTOMY, CRYO-ANALGESIA OF INTERCOSTAL NERVES;  Surgeon: [name], MD;  Location: [employer] [facility];  Service: Thoracic;  Laterality: Right;   HPI:  Betty [name] is a 73 y.o. female admitted 7/9 with medical history significant of tobacco dependence; COPD, CAD; non-small cell lung CA (2016); HTN; HLD: dementia; COPD; stage IV CKD; and chronic combined CHF presenting with AMS. Intubated 7/10-7/13 and reintubated 7/13-7/21.   Assessment / Plan / Recommendation Clinical Impression  Presently, pt is not safe to begin food or liquids. Safety is impacted by significant deconditioning and airway compromise given intubation x 2. When asked to cough she elicits a soft throat clear with much effort to produce. Quality of her voice is hoarse, dysphonic with low intensity. She initiated mastication with ice chips needing breaks due to fatigue. She was able to manipulate and transit  applesauce without significant effort. Did not protrude tongue past dentition and no effort to lateralize during OME. Recommend continue NPO (Cortrak placed today), oral care and continued ST.    SLP Visit Diagnosis: Dysphagia, unspecified (R13.10)    Aspiration Risk  Moderate aspiration risk    Diet Recommendation NPO   Medication Administration:  Via alternative means    Other  Recommendations Oral Care Recommendations: Oral care QID   Follow up Recommendations Inpatient Rehab;Other (comment)(in future if can tolerate 3 hours therapy )      Frequency and Duration min 2x/week  2 weeks       Prognosis Prognosis for Safe Diet Advancement: Good      Swallow Study   General HPI: Betty [name] is a 73 y.o. female admitted 7/9 with medical history significant of tobacco dependence; COPD, CAD; non-small cell lung CA (2016); HTN; HLD: dementia; COPD; stage IV CKD; and chronic combined CHF presenting with AMS. Intubated 7/10-7/13 and reintubated 7/13-7/21. Type of Study: Bedside Swallow Evaluation Previous Swallow Assessment: (none) Diet Prior to this Study: NPO;NG Tube Temperature Spikes Noted: No Respiratory Status: Other (comment)(HFNC) History of Recent Intubation: Yes Length of Intubations (days): (x 2, total 10 days) Date extubated: 05/26/19 Behavior/Cognition: Requires cueing;Lethargic/Drowsy;Other (Comment);Cooperative(awake) Oral Cavity Assessment: Other (comment)(limited view- will continue to assess) Oral Care Completed by SLP: Yes(need to check posterior dentition) Oral Cavity - Dentition: Adequate natural dentition(need to check posterior dentition) Vision: Functional for self-feeding Self-Feeding Abilities: Total assist Patient Positioning: Upright in bed Baseline Vocal Quality: Hoarse;Low vocal intensity Volitional Cough: Weak Volitional Swallow: Able to elicit    Oral/Motor/Sensory Function Overall Oral Motor/Sensory Function: Generalized oral weakness   Ice Chips Ice chips: Impaired Presentation: Spoon Oral Phase Impairments: (weak) Oral Phase Functional Implications: (needed rest breaks) Pharyngeal Phase Impairments: Throat Clearing - Delayed   Thin Liquid Thin Liquid: Not tested    Nectar Thick Nectar Thick Liquid: Not tested   Honey Thick Honey Thick Liquid: Not tested   Puree Puree:  Impaired Presentation: Spoon Oral Phase Impairments: (slow, weak) Oral Phase Functional Implications: Other (comment)(functional) Pharyngeal Phase Impairments: (none)   Solid     Solid: Not tested      Houston Siren 05/27/2019,2:09 PM   Orbie Pyo Colvin Caroli.Ed Risk analyst [phone] Office [phone]

## 2019-05-27 NOTE — Progress Notes (Signed)
 Nutrition Follow-up  DOCUMENTATION CODES:   Not applicable  INTERVENTION:   Tube Feeding:  Osmolite 1.2 at 55 ml/hr Pro-Stat 30 mL daily Provides 88 g of protein, 1684 kcals and 1069 mL free water Meets 100% calorie and protein needs  NUTRITION DIAGNOSIS:   Inadequate oral intake related to acute illness as evidenced by NPO status.  Being addressed via nutrition support  GOAL:   Patient will meet greater than or equal to 90% of their needs  Progressing  MONITOR:   Vent status, TF tolerance, Labs, Weight trends, Skin  REASON FOR ASSESSMENT:   Consult, Ventilator Enteral/tube feeding initiation and management  ASSESSMENT:   74 yo female admitted with severe acute respiratory failure from COVID-19 pneumonia requiring intubation, AKI/CKD IV.  PMH includes COPD, CKD IV, CHF, dementia, HTN, HLD, non-small cell lung ca in 2016, CAD.   Pt is very weak, currently on 4L HFNC  SLP evaluated today, pt to remain NPO. Plan for Cortrak with initiation of TF  Labs: sodium 147 (H), Creatinine 1.98, BUN 68 Meds: ss novolog, levemir   Diet Order:   Diet Order            Diet NPO time specified  Diet effective now              EDUCATION NEEDS:   Not appropriate for education at this time  Skin:  Skin Assessment: Reviewed RN Assessment  Last BM:  7/21 rectal tube  Height:   Ht Readings from Last 1 Encounters:  05/18/19 5\' 4"  (1.626 m)    Weight:   Wt Readings from Last 1 Encounters:  05/27/19 57.5 kg    Ideal Body Weight:  54.5 kg  BMI:  Body mass index is 21.76 kg/m.  Estimated Nutritional Needs:   Kcal:  1650-1850 kcals  Protein:  83-93 g  Fluid:  >/= 1.7 L    Kewanda Poland MS, RDN, LDN, CNSC [phone] Pager  [phone] Weekend/On-Call Pager

## 2019-05-27 NOTE — Progress Notes (Signed)
Report given to [facility], [place]. Patient transferred to PCU. Belongings sent with patient.

## 2019-05-27 NOTE — Progress Notes (Signed)
Respiratory status stable after extubation 7/21.  D/w Dr. [place] Pink during rounds.  Defer management to hospitalist team.  PCCM can be available if needed.  Chesley Mires, MD [facility] 05/27/2019, 10:49 AM

## 2019-05-27 NOTE — Progress Notes (Signed)
ANTICOAGULATION CONSULT NOTE - Follow Up Consult  Pharmacy Consult for Lovenox Indication: atrial fibrillation  Allergies  Allergen Reactions  . Sulfa Antibiotics Itching    Patient Measurements: Height: 5\' 4"  (162.6 cm) Weight: 126 lb 12.2 oz (57.5 kg) IBW/kg (Calculated) : 54.7  Vital Signs: Temp: 97.9 F (36.6 C) (07/22 1600) Temp Source: Oral (07/22 1719) BP: 166/88 (07/22 1719) Pulse Rate: 101 (07/22 1821)  Labs: Recent Labs    05/25/19 0500 05/26/19 0400 05/26/19 0452 05/26/19 1054 05/27/19 0445 05/27/19 1815  HGB 9.8* 10.1* 10.5* 10.5* 11.9*  --   HCT 30.7* 31.6* 31.0* 31.0* 36.9  --   PLT 162 174  --   --  211  --   HEPRLOWMOCWT  --   --   --   --   --  1.38  CREATININE 2.31* 2.57*  --   --  1.98*  --     Estimated Creatinine Clearance: 21.5 mL/min (A) (by C-G formula based on SCr of 1.98 mg/dL (H)).   Assessment: 73 y.o. female admitted on 05/13/2019 with COVID-19 pneumonia, requiring intubation.  Patient developed atrial fibrillation, started on amiodarone which was stopped, was being AV paced. Has been on VTE prophylaxis with SQ heparin, then developed GI bleed on 7/17 with Hgb decreased 7.5 for which she was transfused.  No evidencey of further bleeding and Hgb better so full-dose Lovenox started for afib.  Hgb 11.9 (improving), plt up to 211. SCr down to 1.98, est CrCl 20 ml/min  05/27/19 7:24 PM  - anti-Xa= 1.38 - no bleeding reported  Level above goal but difficult to interpret in the setting of resolving acute on CKD IV.   CHA2DS2/VAS= 5    Goal of Therapy:  Anti-Xa level 0.6-1 units/ml (4 hours post dose) Monitor platelets by anticoagulation protocol: Yes   Plan:  - Decrease lovenox to 50 mg daily - Consider apixaban or IV UFH if rapidly reversible agent desired in setting of recent GIB and acute on CKD - Repeat level as renal function improves/stabilizes - CBC at least q 72h - Monitor for bleeding   Ulice Dash, PharmD,  BCPS Clinical Pharmacist  05/27/2019,7:23 PM

## 2019-05-27 NOTE — Progress Notes (Signed)
Patient transferred from ICU, oriented to unit. Call bell explained, bell alarm on, floor mats placed.

## 2019-05-27 NOTE — Progress Notes (Signed)
Called and updated son, Chrissie Noa. Discussed with him patient's current status, plan of care, and transfer to PCU.

## 2019-05-27 NOTE — Procedures (Signed)
Cortrak  Person Inserting Tube:  Betty [name], RD Tube Type:  Cortrak - 43 inches Tube Location:  Left nare Initial Placement:  Stomach Secured by: Bridle Technique Used to Measure Tube Placement:  Documented cm marking at nare/ corner of mouth Cortrak Secured At:  69 cm    Cortrak Tube Team Note:  Consult received to place a Cortrak feeding tube.   No x-ray is required. RN may begin using tube.   If the tube becomes dislodged please keep the tube and contact the Cortrak team at [email or web address] ([email or web address]) for replacement.  If after hours and replacement cannot be delayed, place a NG tube and confirm placement with an abdominal x-ray.    [place], [place], [place] Pager [phone] After Hours Pager

## 2019-05-27 NOTE — Progress Notes (Signed)
PROGRESS NOTE  Betty [name] TFT:[id number] DOB: [birth year] DOA: 05/13/2019  PCP: Josetta Huddle, MD  Brief History/Interval Summary:  73 year old female with history of coronary artery disease, non-small cell lung cancer in 2016, hypertension, hyperlipidemia, dementia, COPD, chronic kidney disease stage IV, chronic combined CHF who was brought to the hospital by the patient's son due to increased confusion and weakness.  She was diagnosed with coronavirus in the ED on 7/9.  She was also found to be hypoxic requiring 2 L nasal cannula.  She was admitted to the hospital and placed on steroids along with Remdesivir, deferred to ICU on 7/10, where she required intubation, she was extubated on 7/13, where she was reintubated that same day due to encephalopathy, was extubated 7/21   Subjective/Interval History: Patient was extubated yesterday.  Noted to be awake alert this morning.  Uneventful night.   Assessment/Plan:  Acute Hypoxic Resp. Failure due to Acute Covid 19 Viral Illness   COVID-19 Labs  Recent Labs    05/25/19 0500 05/26/19 0400  DDIMER 3.41* 3.13*  CRP 1.1*  --     Lab Results  Component Value Date   SARSCOV2NAA POSITIVE (A) 05/14/2019    Antibiotics: Completed course of Zosyn and vancomycin. Remdesivir: Completed course of Remdesivir Steroids: Completed course of steroids Diuretics: Given Lasix x1 on 7/10.  Not continued due to increase in creatinine Actemra: Actemra x1 on 7/10 Convalescent Plasma: Not given DVT Prophylaxis: lovenox 60 mg every 24 hours  Patient has required reintubation previously.  She was extubated yesterday.  She has done well overnight.  Previously seen by pulmonology during rounds.  Okay for transfer to the floor.  Patient still remains distracted.  Continue to monitor closely.  Patient has completed treatments for COVID-19.  Sepsis, not present on admission Patient developed fever on 7/15 with elevated procalcitonin levels.  She required  pressors due to hypotension.  She was placed on broad-spectrum antibiotic coverage.  There was some question for healthcare associated pneumonia.  She is completed a course of antibiotics.  Fever appears to have resolved.  WBC noted to be 15.2 today.  Continue to monitor.  Shock Likely a combination of septic as well as cardiogenic shock.  She had cold extremities.  Venous blood gas showed saturation of 75 suggesting more of a sepsis etiology rather than cardiogenic.    Atrial fibrillation with RVR She was on amiodarone drip briefly, currently rate controlled, paced rhythm, on  Lovenox treatment dose.  Not on any rate limiting drugs currently.  Patient was on metoprolol prior to admission.  We can reinitiate this.  Lower GI bleed Patient was noted to have blood in his stools.  Gastric lavage did not show any blood in the stomach.  She was transfused 1 unit of PRBC with good response.  No evidence for overt bleeding currently.    Acute blood loss anemia Transfused.  Hemoglobin stable.  Hypernatremia Improving on free water via OGT   Chronic systolic CHF Most recent echocardiogram done in June 2020 showed a EF of 30 to 35%.  Patient was on BiDil at home.  Resume soon.  Acute on chronic kidney disease stage IV Baseline creatinine around 2.0-3.0.  Patient with worsening renal function, currently has stabilized, remains with good urine output, no indication for CRRT per renal.  Monitor urine output.  Hyperglycemia due to steroids HbA1c 6.9 on 7/11.  Elevated CBGs are due to steroids.  She has been on multiple courses of prednisone in the last 1 to  2 months for acute gout which is the likely reason for her hyperglycemia.  And also likely reason for her elevated HbA1c.  Continue long-acting insulin and SSI for now.  She is off of steroids currently.  Will be on tube feedings until oral intake is improved.  History of dementia/acute metabolic encephalopathy Per discussion with her son, appears to  be mild dementia.  Patient is able to live independently.  Apparently has poor short-term memory.  Patient has encephalopathy most likely due to acute illness and medications.   No obvious neurological deficits were noted.   History of non-small cell lung cancer and COPD She is status post right lower lobe lobectomy and has completed 4 cycles of adjuvant chemotherapy.  Lung cancer appears to be in remission.    History of gout Initially it was thought that patient is on chronic prednisone at home but review of previous notes did not mention prednisone.  Discussed on steroids with patient's son.  Apparently over the last 1 to 2 months she has had issues with acute gout for which she was prescribed courses of steroids.  She does not take steroid on a regular basis.  However she was on a course of prednisone at the time of admission.  Nutrition Even though she has been extubated and seems to be stable.  It is felt that she is still has profound weakness and may continue to have issues with oral intake.  Speech therapy has been consulted.  For now we will place orders for core track feeding tube and initiate tube feedings and continue till her oral intake is back to baseline.   DVT Prophylaxis: Lovenox PUD Prophylaxis: Protonix Code Status: Partial, to be intubated if needed Family Communication: discuss with son on daily basis. Disposition Plan: Will remain in ICU  Reason for Visit: Acute respiratory disease due to COVID-19  Consultants: Pulmonology  Procedures:  Intubation 7/10 Central line placement right IJ 7/10  Antibiotics: Anti-infectives (From admission, onward)   Start     Dose/Rate Route Frequency Ordered Stop   05/19/19 1030  piperacillin-tazobactam (ZOSYN) IVPB 2.25 g     2.25 g 100 mL/hr over 30 Minutes Intravenous Every 8 hours 05/19/19 1023     05/15/19 1230  remdesivir 100 mg in sodium chloride 0.9 % 250 mL IVPB     100 mg 500 mL/hr over 30 Minutes Intravenous Every 24  hours 05/14/19 1148 05/18/19 1340   05/14/19 1230  remdesivir 200 mg in sodium chloride 0.9 % 250 mL IVPB     200 mg 500 mL/hr over 30 Minutes Intravenous Once 05/14/19 1148 05/14/19 1551        Medications:  Scheduled: . chlorhexidine  15 mL Mouth Rinse BID  . Chlorhexidine Gluconate Cloth  6 each Topical Daily  . docusate  100 mg Oral BID  . enoxaparin (LOVENOX) injection  1 mg/kg Subcutaneous Q24H  . insulin aspart  0-20 Units Subcutaneous Q4H  . insulin detemir  10 Units Subcutaneous Daily  . mouth rinse  15 mL Mouth Rinse q12n4p  . pantoprazole (PROTONIX) IV  40 mg Intravenous Q24H  . QUEtiapine  50 mg Oral QHS  . sodium chloride flush  3 mL Intravenous Q12H   Continuous: . sodium chloride Stopped (05/27/19 0654)  . dextrose 40 mL/hr at 05/27/19 1200   RWE:RXVQMG chloride, bisacodyl, fentaNYL (SUBLIMAZE) injection, ipratropium-albuterol, labetalol, LORazepam, ondansetron **OR** ondansetron (ZOFRAN) IV, polyethylene glycol   Objective:  Vital Signs  Vitals:   05/27/19 0900 05/27/19 1000  05/27/19 1100 05/27/19 1200  BP: (!) 173/84 (!) 153/84 (!) 141/99 (!) 151/84  Pulse: (!) 119 (!) 106 (!) 109 (!) 108  Resp: (!) 23 18 18 18   Temp:    97.9 F (36.6 C)  TempSrc:    Oral  SpO2: 98% 96% 98% 99%  Weight:      Height:        Intake/Output Summary (Last 24 hours) at 05/27/2019 1332 Last data filed at 05/27/2019 1200 Gross per 24 hour  Intake 1087.93 ml  Output 2825 ml  Net -1737.07 ml   Filed Weights   05/25/19 0500 05/26/19 0500 05/27/19 0400  Weight: 62 kg 59.7 kg 57.5 kg    General appearance: Awake alert.  Mildly distracted.  Answers certain questions.  Follows commands. Resp: Diminished air entry at the bases.  No wheezing.  No rhonchi.  Crackles at the bases.  Cardio: S1-S2 is mildly tachycardic regular.  No S3-S4. GI: Abdomen is soft.  Nontender nondistended.  Bowel sounds are present normal.  No masses organomegaly Extremities: No edema.  Neurologic:    No focal neurological deficits.      Lab Results:  Data Reviewed: I have personally reviewed following labs and imaging studies  CBC: Recent Labs  Lab 05/23/19 0500 05/24/19 0500 05/25/19 0500 05/26/19 0400 05/26/19 0452 05/26/19 1054 05/27/19 0445  WBC 11.3* 10.7* 11.3* 12.3*  --   --  15.2*  HGB 9.3* 9.9* 9.8* 10.1* 10.5* 10.5* 11.9*  HCT 28.7* 30.0* 30.7* 31.6* 31.0* 31.0* 36.9  MCV 92.3 92.9 93.3 94.6  --   --  93.2  PLT 153 158 162 174  --   --  536    Basic Metabolic Panel: Recent Labs  Lab 05/21/19 0500 05/22/19 0500 05/23/19 0500 05/24/19 0500 05/25/19 0500 05/26/19 0400 05/26/19 0452 05/26/19 1054 05/27/19 0445  NA 151* 147* 144 144 142 142 144 145 147*  K 4.8 4.6 4.7 4.3 4.5 3.7 3.9 3.6 3.7  CL 119* 118* 115* 113* 112* 110  --   --  116*  CO2 22 18* 20* 22 21* 21*  --   --  20*  GLUCOSE 50* 143* 116* 97 57* 94  --   --  130*  BUN 85* 91* 94* 90* 93* 97*  --   --  69*  CREATININE 3.26* 3.14* 2.74* 2.38* 2.31* 2.57*  --   --  1.98*  CALCIUM 8.5* 8.3* 8.6* 8.7* 8.7* 8.8*  --   --  9.2  MG 2.3 2.2 2.1 2.0  --   --   --   --   --     GFR: Estimated Creatinine Clearance: 21.5 mL/min (A) (by C-G formula based on SCr of 1.98 mg/dL (H)).  Liver Function Tests: Recent Labs  Lab 05/22/19 0500 05/23/19 0500 05/24/19 0500 05/25/19 0500 05/27/19 0445  AST 42* 37 39 38 42*  ALT 20 21 23 26 31   ALKPHOS 69 57 60 55 59  BILITOT 0.3 0.5 0.4 0.3 0.7  PROT 5.0* 5.1* 5.2* 5.2* 6.0*  ALBUMIN 2.4* 2.4* 2.4* 2.4* 2.9*    CBG: Recent Labs  Lab 05/26/19 2012 05/27/19 0000 05/27/19 0359 05/27/19 0743 05/27/19 1153  GLUCAP 114* 118* 131* 160* 157*      Recent Results (from the past 240 hour(s))  MRSA PCR Screening     Status: None   Collection Time: 05/18/19  5:16 AM   Specimen: Nasal Mucosa; Nasopharyngeal  Result Value Ref Range Status   MRSA by PCR  NEGATIVE NEGATIVE Final    Comment:        The GeneXpert MRSA Assay (FDA approved for NASAL  specimens only), is one component of a comprehensive MRSA colonization surveillance program. It is not intended to diagnose MRSA infection nor to guide or monitor treatment for MRSA infections. Performed at [facility], [place] [address], [place], [place] [address]   Culture, respiratory (non-expectorated)     Status: None   Collection Time: 05/22/19  5:00 AM   Specimen: Tracheal Aspirate; Respiratory  Result Value Ref Range Status   Specimen Description   Final    TRACHEAL ASPIRATE Performed at [place] [address] [place], [place] [address]    Special Requests   Final    NONE Performed at [facility], [place] [address] [place], [place] [address]    Gram Stain   Final    MODERATE WBC PRESENT,BOTH PMN AND MONONUCLEAR ABUNDANT GRAM POSITIVE COCCI IN PAIRS IN CLUSTERS MODERATE GRAM VARIABLE ROD    Culture   Final    FEW Consistent with normal respiratory flora. Performed at [facility], [place] [address], [place], [place] [address]    Report Status 05/24/2019 FINAL  Final  Culture, Urine     Status: None   Collection Time: 05/22/19  5:00 AM   Specimen: Urine, Clean Catch  Result Value Ref Range Status   Specimen Description   Final    URINE, CLEAN CATCH Performed at [facility], [place] [address] [place], [place] [address]    Special Requests   Final    NONE Performed at [facility], [place] [address], [place], [place] [address]    Culture   Final    NO GROWTH Performed at [facility], [place] [address], [place],  [address]    Report Status 05/23/2019 FINAL  Final      Radiology Studies: No results found.     LOS: 13 days   Bonnielee Haff MD  [facility] Pager on [email or web address]  05/27/2019, 1:32 PM

## 2019-05-27 NOTE — Progress Notes (Signed)
ANTICOAGULATION CONSULT NOTE - Follow Up Consult  Pharmacy Consult for Lovenox Indication: atrial fibrillation  Allergies  Allergen Reactions  . Sulfa Antibiotics Itching    Patient Measurements: Height: 5\' 4"  (162.6 cm) Weight: 126 lb 12.2 oz (57.5 kg) IBW/kg (Calculated) : 54.7  Vital Signs: Temp: 98 F (36.7 C) (07/22 0800) Temp Source: Oral (07/22 0800) BP: 153/84 (07/22 1000) Pulse Rate: 106 (07/22 1000)  Labs: Recent Labs    05/25/19 0500 05/26/19 0400 05/26/19 0452 05/26/19 1054 05/27/19 0445  HGB 9.8* 10.1* 10.5* 10.5* 11.9*  HCT 30.7* 31.6* 31.0* 31.0* 36.9  PLT 162 174  --   --  211  CREATININE 2.31* 2.57*  --   --  1.98*    Estimated Creatinine Clearance: 21.5 mL/min (A) (by C-G formula based on SCr of 1.98 mg/dL (H)).   Assessment: 73 y.o. female admitted on 05/13/2019 with COVID-19 pneumonia, requiring intubation.  Patient developed atrial fibrillation, started on amiodarone which was stopped, was being AV paced. Has been on VTE prophylaxis with SQ heparin, then developed GI bleed on 7/17 with Hgb decreased 7.5 for which she was transfused.  No evidencey of further bleeding and Hgb better so full-dose Lovenox started for afib.  Hgb 11.9 (improving), plt up to 211. SCr down to 1.98, est CrCl 20 ml/min  Goal of Therapy:  Anti-Xa level 0.6-1 units/ml (4 hours post dose) Monitor platelets by anticoagulation protocol: Yes   Plan:  Lovenox 60 mg SQ daily Monitor renal function and CBC Will check level today since pt at steady state  Sherlon Handing, PharmD, [place] pharmacist phone [phone] 05/27/2019,10:38 AM

## 2019-05-28 ENCOUNTER — Inpatient Hospital Stay (HOSPITAL_COMMUNITY): Payer: Medicare Other

## 2019-05-28 DIAGNOSIS — I5042 Chronic combined systolic (congestive) and diastolic (congestive) heart failure: Secondary | ICD-10-CM

## 2019-05-28 DIAGNOSIS — R400 Somnolence: Secondary | ICD-10-CM

## 2019-05-28 DIAGNOSIS — I4891 Unspecified atrial fibrillation: Secondary | ICD-10-CM

## 2019-05-28 DIAGNOSIS — C3491 Malignant neoplasm of unspecified part of right bronchus or lung: Secondary | ICD-10-CM

## 2019-05-28 DIAGNOSIS — F0150 Vascular dementia without behavioral disturbance: Secondary | ICD-10-CM

## 2019-05-28 DIAGNOSIS — K922 Gastrointestinal hemorrhage, unspecified: Secondary | ICD-10-CM

## 2019-05-28 LAB — COMPREHENSIVE METABOLIC PANEL
ALT: 30 U/L (ref 0–44)
ALT: 27 U/L (ref 0–44)
AST: 38 U/L (ref 15–41)
AST: 34 U/L (ref 15–41)
Albumin: 3.1 g/dL — ABNORMAL LOW (ref 3.5–5.0)
Albumin: 3.2 g/dL — ABNORMAL LOW (ref 3.5–5.0)
Alkaline Phosphatase: 68 U/L (ref 38–126)
Alkaline Phosphatase: 68 U/L (ref 38–126)
Anion gap: 13 (ref 5–15)
Anion gap: 10 (ref 5–15)
BUN: 56 mg/dL — ABNORMAL HIGH (ref 8–23)
BUN: 63 mg/dL — ABNORMAL HIGH (ref 8–23)
CO2: 19 mmol/L — ABNORMAL LOW (ref 22–32)
CO2: 18 mmol/L — ABNORMAL LOW (ref 22–32)
Calcium: 9.7 mg/dL (ref 8.9–10.3)
Calcium: 9.9 mg/dL (ref 8.9–10.3)
Chloride: 117 mmol/L — ABNORMAL HIGH (ref 98–111)
Chloride: 123 mmol/L — ABNORMAL HIGH (ref 98–111)
Creatinine, Ser: 1.86 mg/dL — ABNORMAL HIGH (ref 0.44–1.00)
Creatinine, Ser: 1.98 mg/dL — ABNORMAL HIGH (ref 0.44–1.00)
GFR calc Af Amer: 30 mL/min — ABNORMAL LOW (ref >60)
GFR calc Af Amer: 28 mL/min — ABNORMAL LOW (ref >60)
GFR calc non Af Amer: 26 mL/min — ABNORMAL LOW (ref >60)
GFR calc non Af Amer: 24 mL/min — ABNORMAL LOW (ref >60)
Glucose, Bld: 115 mg/dL — ABNORMAL HIGH (ref 70–99)
Glucose, Bld: 144 mg/dL — ABNORMAL HIGH (ref 70–99)
Potassium: 3.6 mmol/L (ref 3.5–5.1)
Potassium: 5.1 mmol/L (ref 3.5–5.1)
Sodium: 149 mmol/L — ABNORMAL HIGH (ref 135–145)
Sodium: 151 mmol/L — ABNORMAL HIGH (ref 135–145)
Total Bilirubin: 0.7 mg/dL (ref 0.3–1.2)
Total Bilirubin: 0.9 mg/dL (ref 0.3–1.2)
Total Protein: 6.5 g/dL (ref 6.5–8.1)
Total Protein: 6.8 g/dL (ref 6.5–8.1)

## 2019-05-28 LAB — PHOSPHORUS
Phosphorus: 4 mg/dL (ref 2.5–4.6)
Phosphorus: 3.8 mg/dL (ref 2.5–4.6)

## 2019-05-28 LAB — POCT I-STAT 7, (LYTES, BLD GAS, ICA,H+H)
Acid-base deficit: 4 mmol/L — ABNORMAL HIGH (ref 0.0–2.0)
Bicarbonate: 17 mmol/L — ABNORMAL LOW (ref 20.0–28.0)
Calcium, Ion: 1.42 mmol/L — ABNORMAL HIGH (ref 1.15–1.40)
HCT: 36 % (ref 36.0–46.0)
Hemoglobin: 12.2 g/dL (ref 12.0–15.0)
O2 Saturation: 99 %
Potassium: 4.2 mmol/L (ref 3.5–5.1)
Sodium: 148 mmol/L — ABNORMAL HIGH (ref 135–145)
TCO2: 18 mmol/L — ABNORMAL LOW (ref 22–32)
pCO2 arterial: 21.7 mmHg — ABNORMAL LOW (ref 32.0–48.0)
pH, Arterial: 7.503 — ABNORMAL HIGH (ref 7.350–7.450)
pO2, Arterial: 111 mmHg — ABNORMAL HIGH (ref 83.0–108.0)

## 2019-05-28 LAB — CBC WITH DIFFERENTIAL/PLATELET
Abs Immature Granulocytes: 0.07 10*3/uL (ref 0.00–0.07)
Abs Immature Granulocytes: 0.08 10*3/uL — ABNORMAL HIGH (ref 0.00–0.07)
Basophils Absolute: 0 10*3/uL (ref 0.0–0.1)
Basophils Absolute: 0 10*3/uL (ref 0.0–0.1)
Basophils Relative: 0 %
Basophils Relative: 0 %
Eosinophils Absolute: 0.1 10*3/uL (ref 0.0–0.5)
Eosinophils Absolute: 0.1 10*3/uL (ref 0.0–0.5)
Eosinophils Relative: 1 %
Eosinophils Relative: 0 %
HCT: 40.3 % (ref 36.0–46.0)
HCT: 39.9 % (ref 36.0–46.0)
Hemoglobin: 13 g/dL (ref 12.0–15.0)
Hemoglobin: 13.1 g/dL (ref 12.0–15.0)
Immature Granulocytes: 1 %
Immature Granulocytes: 1 %
Lymphocytes Relative: 13 %
Lymphocytes Relative: 7 %
Lymphs Abs: 1.9 10*3/uL (ref 0.7–4.0)
Lymphs Abs: 1.1 10*3/uL (ref 0.7–4.0)
MCH: 30.5 pg (ref 26.0–34.0)
MCH: 31 pg (ref 26.0–34.0)
MCHC: 32.3 g/dL (ref 30.0–36.0)
MCHC: 32.8 g/dL (ref 30.0–36.0)
MCV: 94.6 fL (ref 80.0–100.0)
MCV: 94.3 fL (ref 80.0–100.0)
Monocytes Absolute: 0.3 10*3/uL (ref 0.1–1.0)
Monocytes Absolute: 0.4 10*3/uL (ref 0.1–1.0)
Monocytes Relative: 2 %
Monocytes Relative: 2 %
Neutro Abs: 12.3 10*3/uL — ABNORMAL HIGH (ref 1.7–7.7)
Neutro Abs: 14.9 10*3/uL — ABNORMAL HIGH (ref 1.7–7.7)
Neutrophils Relative %: 83 %
Neutrophils Relative %: 90 %
Platelets: 252 10*3/uL (ref 150–400)
Platelets: 258 10*3/uL (ref 150–400)
RBC: 4.26 MIL/uL (ref 3.87–5.11)
RBC: 4.23 MIL/uL (ref 3.87–5.11)
RDW: 16.2 % — ABNORMAL HIGH (ref 11.5–15.5)
RDW: 16.8 % — ABNORMAL HIGH (ref 11.5–15.5)
WBC: 14.7 10*3/uL — ABNORMAL HIGH (ref 4.0–10.5)
WBC: 16.5 10*3/uL — ABNORMAL HIGH (ref 4.0–10.5)
nRBC: 0 % (ref 0.0–0.2)
nRBC: 0 % (ref 0.0–0.2)

## 2019-05-28 LAB — MAGNESIUM
Magnesium: 2.1 mg/dL (ref 1.7–2.4)
Magnesium: 2.3 mg/dL (ref 1.7–2.4)
Magnesium: 2.4 mg/dL (ref 1.7–2.4)

## 2019-05-28 LAB — GLUCOSE, CAPILLARY
Glucose-Capillary: 128 mg/dL — ABNORMAL HIGH (ref 70–99)
Glucose-Capillary: 150 mg/dL — ABNORMAL HIGH (ref 70–99)
Glucose-Capillary: 163 mg/dL — ABNORMAL HIGH (ref 70–99)
Glucose-Capillary: 192 mg/dL — ABNORMAL HIGH (ref 70–99)
Glucose-Capillary: 201 mg/dL — ABNORMAL HIGH (ref 70–99)
Glucose-Capillary: 207 mg/dL — ABNORMAL HIGH (ref 70–99)

## 2019-05-28 LAB — PROTIME-INR
INR: 1.1 (ref 0.8–1.2)
Prothrombin Time: 14.3 seconds (ref 11.4–15.2)

## 2019-05-28 LAB — LACTIC ACID, PLASMA: Lactic Acid, Venous: 2.1 mmol/L (ref 0.5–1.9)

## 2019-05-28 MED ORDER — FREE WATER
200.0000 mL | Status: DC
  Administered 2019-05-28 – 2019-05-29 (×4): 200 mL

## 2019-05-28 MED ORDER — IPRATROPIUM-ALBUTEROL 0.5-2.5 (3) MG/3ML IN SOLN: 3.0000 mL | Freq: Four times a day (QID) | RESPIRATORY_TRACT | Status: DC

## 2019-05-28 MED ORDER — IPRATROPIUM-ALBUTEROL 20-100 MCG/ACT IN AERS
1.0000 | INHALATION_SPRAY | Freq: Four times a day (QID) | RESPIRATORY_TRACT | Status: DC
  Administered 2019-05-28 (×2): 1 via RESPIRATORY_TRACT
  Filled 2019-05-28: qty 4

## 2019-05-28 MED ORDER — SODIUM CHLORIDE 0.9 % IV BOLUS
500.0000 mL | Freq: Once | INTRAVENOUS | Status: AC
  Administered 2019-05-28: 17:00:00 500 mL via INTRAVENOUS

## 2019-05-28 MED ORDER — FREE WATER
100.0000 mL | Freq: Three times a day (TID) | Status: DC
  Administered 2019-05-28: 14:00:00 100 mL

## 2019-05-28 MED ORDER — POTASSIUM CHLORIDE 10 MEQ/100ML IV SOLN
10.0000 meq | INTRAVENOUS | Status: AC
  Administered 2019-05-28 (×4): 10 meq via INTRAVENOUS
  Filled 2019-05-28: qty 100

## 2019-05-28 NOTE — Progress Notes (Signed)
PT Cancellation Note  Patient Details Name: TANAE PETROSKY MRN: [id number] DOB: 1946/07/16   Cancelled Treatment:    Reason Eval/Treat Not Completed: Medical issues which prohibited therapy, per RN, getting  K+, has a bruise on side, wants  PT to check back later today.   [name] 05/28/2019, 8:51 AM

## 2019-05-28 NOTE — Progress Notes (Signed)
[facility] PCCM Evening rounds  Respiratory status stable Labs reviewed, very hypernatremic and hyperchloremic Increase free water  Roselie Awkward, MD [place] P[facility]M Pager: [phone] Cell: [phone] If no response, call [phone]

## 2019-05-28 NOTE — Progress Notes (Signed)
NAME:  Betty [name], MRN:  [id number], DOB:  02-24-1946, LOS: 57 ADMISSION DATE:  05/13/2019, CONSULTATION DATE: July 10 REFERRING MD: Dr. Cruzita Lederer, CHIEF COMPLAINT:  Dyspnea  Brief History   73 year old female presented to the hospital with confusion, weakness all due to COVID pneumonia.  Developed worsening hypoxemia and respiratory failure so required intubation.  Was ultimately extubated but then because of encephalopathy required to be intubated again.  Developed acute kidney injury while hospitalized in the intensive care unit, never needed hemodialysis.  Was ultimately extubated again and was transferred out of the ICU on July 22.  Return to the ICU on July 23 in the setting of worsening encephalopathy.   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% from Echo June 2020  [place] [facility] Events   7/09 admission, start steroids and remdesivir 7/10 intubated, start actemera 7/13 Extubated >> reintubated due to altered mental status, hypotension and A fib with RVR; start amiodarone and pressors; complete remdesivir 7/14 Start ABX 7/15 Fever 101.6; stop amiodarone 7/17 transfuse PRBC with concern for lower GI bleed 7/18 Off pressors 7/20 d/c steroids 7/21 Extubated 7/22 moved out of ICU 7/23 worsening encephalopathy, resp alkalosis, moved back to ICU  Consults:  PCCM Nephrology 7/14 AKI with CKD >> s/o 7/18  Procedures:  ETT 7/10 >> 7/13 Rt IJ CVL 7/10 >> removed ETT 7/14 >> 7/21  Significant Diagnostic Tests:  04/22/19 LVEF 30-35%, LVH, LV hypokinesis, RV normal function, minimal valve insufficiencies CT head 05/14/19 > stable Rt MCA bifurcation aneurysm, atrophy, white matter disease  Micro Data:  SARS Coronavirus 2 7/09 > positive Urine 7/09 > negative Urine 7/17 > negative Sputum 7/17 > negative  Antimicrobials:  Remdesivir 7/09 >7/13 Zosyn 7/14 > 7/20 Vancomycin 7/17  Interim history/subjective:   Extubated July 21 Transferred out of ICU on July 22 This morning developed worsening encephalopathy, respiratory alkalosis of undetermined etiology.  Objective   Blood pressure (!) 164/90, pulse (!) 109, temperature 97.7 F (36.5 C), resp. rate 18, height '5\' 4"'$  (1.626 m), weight 57 kg, SpO2 93 %.        Intake/Output Summary (Last 24 hours) at 05/28/2019 1306 Last data filed at 05/28/2019 0600 Gross per 24 hour  Intake 512.95 ml  Output 1150 ml  Net -637.05 ml   Filed Weights   05/26/19 0500 05/27/19 0400 05/28/19 0322  Weight: 59.7 kg 57.5 kg 57 kg    Examination:  General:  Resting in bed, no respiratory distress HENT: NCAT OP clear PULM: CTA B, normal effort CV: RRR, no mgr GI: BS+, soft, nontender, bruising noted over left flank MSK: normal bulk and tone Neuro: Will open eyes and say 1-2 word statements when asked but not oriented to situation, clearly confused, moves all 4 extremities   Resolved Hospital Problem list     Assessment & Plan:  Acute respiratory alkalosis: Undetermined etiology, consider underlying metabolic cause, delirium, question acute anemia, question pulmonary embolism. Stat repeat CBC now, stat repeat be met now Consider CT chest to evaluate for pulmonary embolism  Hypoxemic respiratory failure secondary to COVID-19 pneumonia: Oxygenation stable No indication for intubation right now but needs close monitoring in the ICU as she is very high risk for decompensation Transfer to ICU now Close monitoring in ICU Continue to administer oxygen to maintain O2 saturation greater than 88% N.p.o. for now  Bruising on left flank, minor bleeding noted around rectal tube: Stat repeat CBC,  coags, be met Consider CT abdomen  Chronic systolic heart failure: Telemetry monitoring Continue metoprolol  Acute encephalopathy: Appears to be due to underlying metabolic cause? Consider repeat head CT, though at this time do not think that is indicated as this  appears to be an underlying metabolic problem Hold quetiapine Close monitoring in ICU  Best practice:  Diet: N.p.o. Pain/Anxiety/Delirium protocol (if indicated): Not applicable VAP protocol (if indicated): Not applicable DVT prophylaxis: lovenox GI prophylaxis: n/a Glucose control: per TRH Mobility: bed rest for now Code Status: partial code, repeat intubation OK per family Family Communication: per [facility] Disposition: remain in ICU  Labs   CBC: Recent Labs  Lab 05/24/19 0500 05/25/19 0500 05/26/19 0400 05/26/19 0452 05/26/19 1054 05/27/19 0445 05/28/19 0300 05/28/19 1210  WBC 10.7* 11.3* 12.3*  --   --  15.2* 14.7*  --   NEUTROABS  --   --   --   --   --   --  12.3*  --   HGB 9.9* 9.8* 10.1* 10.5* 10.5* 11.9* 13.0 12.2  HCT 30.0* 30.7* 31.6* 31.0* 31.0* 36.9 40.3 36.0  MCV 92.9 93.3 94.6  --   --  93.2 94.6  --   PLT 158 162 174  --   --  211 252  --     Basic Metabolic Panel: Recent Labs  Lab 05/22/19 0500 05/23/19 0500 05/24/19 0500 05/25/19 0500 05/26/19 0400 05/26/19 0452 05/26/19 1054 05/27/19 0445 05/27/19 1511 05/28/19 0300 05/28/19 1210  NA 147* 144 144 142 142 144 145 147*  --  149* 148*  K 4.6 4.7 4.3 4.5 3.7 3.9 3.6 3.7  --  3.6 4.2  CL 118* 115* 113* 112* 110  --   --  116*  --  117*  --   CO2 18* 20* 22 21* 21*  --   --  20*  --  19*  --   GLUCOSE 143* 116* 97 57* 94  --   --  130*  --  115*  --   BUN 91* 94* 90* 93* 97*  --   --  69*  --  56*  --   CREATININE 3.14* 2.74* 2.38* 2.31* 2.57*  --   --  1.98*  --  1.86*  --   CALCIUM 8.3* 8.6* 8.7* 8.7* 8.8*  --   --  9.2  --  9.7  --   MG 2.2 2.1 2.0  --   --   --   --   --  1.9 2.1  --   PHOS  --   --   --   --   --   --   --   --  3.9 4.0  --    GFR: Estimated Creatinine Clearance: 22.9 mL/min (A) (by C-G formula based on SCr of 1.86 mg/dL (H)). Recent Labs  Lab 05/22/19 0500 05/23/19 0500 05/24/19 0500 05/25/19 0500 05/26/19 0400 05/27/19 0445 05/28/19 0300  PROCALCITON 4.99 3.04  1.48 0.91  --   --   --   WBC 15.0* 11.3* 10.7* 11.3* 12.3* 15.2* 14.7*  LATICACIDVEN 1.4  --   --   --   --   --   --     Liver Function Tests: Recent Labs  Lab 05/23/19 0500 05/24/19 0500 05/25/19 0500 05/27/19 0445 05/28/19 0300  AST 37 39 38 42* 38  ALT '21 23 26 31 30  '$ ALKPHOS 57 60 55 59 68  BILITOT 0.5 0.4 0.3 0.7 0.7  PROT  5.1* 5.2* 5.2* 6.0* 6.5  ALBUMIN 2.4* 2.4* 2.4* 2.9* 3.1*   No results for input(s): LIPASE, AMYLASE in the last 168 hours. No results for input(s): AMMONIA in the last 168 hours.  ABG    Component Value Date/Time   PHART 7.503 (H) 05/28/2019 1210   PCO2ART 21.7 (L) 05/28/2019 1210   PO2ART 111.0 (H) 05/28/2019 1210   HCO3 17.0 (L) 05/28/2019 1210   TCO2 18 (L) 05/28/2019 1210   ACIDBASEDEF 4.0 (H) 05/28/2019 1210   O2SAT 99.0 05/28/2019 1210     Coagulation Profile: No results for input(s): INR, PROTIME in the last 168 hours.  Cardiac Enzymes: No results for input(s): CKTOTAL, CKMB, CKMBINDEX, TROPONINI in the last 168 hours.  HbA1C: Hgb A1c MFr Bld  Date/Time Value Ref Range Status  05/16/2019 05:00 AM 6.9 (H) 4.8 - 5.6 % Final    Comment:    (NOTE) Pre diabetes:          5.7%-6.4% Diabetes:              >6.4% Glycemic control for   <7.0% adults with diabetes     CBG: Recent Labs  Lab 05/27/19 2003 05/27/19 2354 05/28/19 0319 05/28/19 0754 05/28/19 1219  GLUCAP 135* 128* 150* 207* 163*     Critical care time: 35 minutes     Roselie Awkward, MD [place] [facility] Pager: [phone] Cell: [phone] If no response, call [phone]

## 2019-05-28 NOTE — Progress Notes (Signed)
Patient saturations continue to drop. Patient has become more lethargic and unable to answer questions and having difficulty following commands. Called RT and asked them to come and see patient. Notified Dr. Sherral Hammers of patient situation.  Dr. Sherral Hammers  And Dr. Lake Bells came to see patient and both agreed patient should be transferred to ICU. Will transport patient to ICU.

## 2019-05-28 NOTE — Progress Notes (Signed)
SLP Cancellation Note  Patient Details Name: Betty [name] MRN: [id number] DOB: July 27, 1946   Cancelled treatment:        Pt transferred back to ICU. Per notes developed worsening encephalopathy, respiratory alkalosis of undetermined etiology. Will continue efforts.                                                                                                Houston Siren 05/28/2019, 3:55 PM  Orbie Pyo Colvin Caroli.Ed Risk analyst [phone] Office [phone]

## 2019-05-28 NOTE — Progress Notes (Signed)
Son, Chrissie Noa, called and updated on his mother's status. She is currently stable on 4L HFNC, however still demonstrating signs of hypoactive delirium. Was able to smile and greet me as I entered the room, then became somnolent again. He appreciated the update and has no further questions at this time.

## 2019-05-28 NOTE — Progress Notes (Addendum)
PROGRESS NOTE    Betty [name]  YTK:[id number] DOB: 03-09-1946 DOA: 05/13/2019 PCP: Josetta Huddle, MD   Brief Narrative:  73 year old BF PMHx dementia, CAD, NSCLC in 2016, HTN, HLD COPD, kidney stage IV, hronic kidney disease stage IV, chronic systolic and diastolic CHF   brought to the hospital by the patient's son due to increased confusion and weakness. She was diagnosed with coronavirus in the ED on 7/9. She was also found to be hypoxic requiring 2 L nasal cannula. She was admitted to the hospital and placed on steroids along with Remdesivir, deferred to ICU on 7/10, where she required intubation, she was extubated on 7/13, where she was reintubated that same day due to encephalopathy, was extubated 7/21   Subjective: Patient opens her eyes when name is called.  Otherwise A/O x1 (does not know when, where, why).  Mumbles unintelligibly.  Does not follow commands.   Assessment & Plan:   Principal Problem:   COVID-19 Active Problems:   Non-small cell carcinoma of right lung, stage 1 (HCC)   CKD (chronic kidney disease), stage IV (HCC)   Chronic systolic heart failure (HCC)   Dementia (HCC)   Essential hypertension   Acute respiratory failure with hypoxemia (HCC)   Acute respiratory failure with hypoxia/COVID 19 pneumonia Recent Labs  Lab 05/22/19 0500 05/23/19 0500 05/24/19 0500 05/25/19 0500  CRP 7.5* 4.1* 2.0* 1.1*   Recent Labs  Lab 05/22/19 0500 05/23/19 0500 05/24/19 0500 05/25/19 0500 05/26/19 0400  DDIMER 1.68* 3.16* 3.75* 3.41* 3.13*  -Complete course of Zosyn and vancomycin - Completed course Remdesivir + steroids -Actemra x1 administered 7/10.   - Intubated x2 while hospitalized. -Titrate O2 to maintain SPO2 89 to 93% -DuoNeb every 6 hours  Septic shock  -Venous blood gas showed saturation of 75 suggesting more of a sepsis etiology rather than cardiogenic  Chronic systolic and diastolic CHF -Strict in and out -Daily weight - June 2020  echocardiogram EF 30 to 35%.    A. fib with RVR - On metoprolol prior to admission. - Briefly on amiodarone drip. - Currently rate controlled paced rhythm - On Lovenox  Lower GI bleed - During his hospitalization patient noted to have bloody stools, gastric lavage did not show any blood in the stomach. - Transfuse 1 unit PRBC - Currently blood noticed around Flexi-Seal and watery fluid within her gastric bag possibly positive for blood. - Occult blood pending  Acute blood loss anemia secondary to GI bleed  -GI bleed may be reoccurring.  See lower GI bleed  Hypernatremia Recent Labs  Lab 05/26/19 0452 05/26/19 1054 05/27/19 0445 05/28/19 0300 05/28/19 1210  NA 144 145 147* 149* 148*  -Trending back up, restart free water 157ml TID  Hypokalemia -Potassium goal> 4 - Potassium IV 40 mEq  Acute on CKD stage IV (baseline Cr 2- 3) Recent Labs  Lab 05/22/19 0500 05/23/19 0500 05/24/19 0500 05/25/19 0500 05/26/19 0400 05/27/19 0445 05/28/19 0300  CREATININE 3.14* 2.74* 2.38* 2.31* 2.57* 1.98* 1.86*  -At baseline  Prediabetes vs hyperglycemia - 7/1 hemoglobin A1c= 6.9 -Prior EMR has had multiple episodes of steroids in the past 1 to 2 months for acute gout as well as COVID.  Patient now off steroids - Levemir 10 units daily - Resistant SSI  Dementia/acute metabolic encephalopathy - Discussed case with PCCM staff patient's baseline is; able to care for herself independently, yesterday post extubation was able to carry on a conversation respond appropriately to commands. - Today's cognitive evaluation showed a  marked difference, patient was lethargic open her eyes when her name was called but did not follow commands.  Withdrew to painful stimuli.  Hx NSCLC/COPD -S/p RLL lobectomy + 4 cycles of adjuvant chemotherapy -Lung cancer appears to be in remission per EMR   Hx gout  -Per EMR last 1 to 65-month treated for acute gout with steroids.  Does not take steroids  on a regular basis  Nutrition -Continue tube feeds until cleared by speech therapy. -Given patient's setback today would not involve speech for full dysphasia evaluation until we see a profound turnaround inpatient status.      DVT prophylaxis: Heparin Code Status: Partial Family Communication: Spoke with [name] (son) at length concerning patient's readmission to the ICU secondary to her decreasing respiratory drive. Disposition Plan: TBD   Consultants:  PCCM    Procedures/Significant Events:  6/17 echocardiogram:Left Ventricle: The left ventricle has moderate-severely reduced systolic function, with an ejection fraction of 30-35%. The cavity size was normal. There is mildly increased left ventricular wall thickness. Left ventricular diastolic Doppler parameters  are consistent with impaired relaxation. Left ventricular diffuse hypokinesis.  Right Ventricle: The right ventricle has normal systolic function. The cavity was normal. There is no increase in right ventricular wall thickness. Pacing wire/catheter visualized in the right ventricle.  Left Atrium: Left atrial size was normal in size.  Right Atrium: Right atrial size was normal in size. Right atrial pressure is estimated at 10 mmHg.  Interatrial Septum: No atrial level shunt detected by color flow Doppler.  Pericardium: There is no evidence of pericardial effusion.  Mitral Valve: The mitral valve is normal in structure. Mitral valve regurgitation is not visualized by color flow Doppler.  Tricuspid Valve: The tricuspid valve is normal in structure. Tricuspid valve regurgitation is mild by color flow Doppler.  Aortic Valve: The aortic valve is normal in structure. Aortic valve regurgitation was not visualized by color flow Doppler.  Pulmonic Valve: The pulmonic valve was normal in structure. Pulmonic valve regurgitation is trivial by color flow Doppler.  Venous: The inferior vena cava measures 0.50 cm, is  normal in size with greater than 50% respiratory variability.   I have personally reviewed and interpreted all radiology studies and my findings are as above.  VENTILATOR SETTINGS:    Cultures   Antimicrobials: Anti-infectives (From admission, onward)   Start     Stop   05/22/19 1130  vancomycin (VANCOCIN) 1,250 mg in sodium chloride 0.9 % 250 mL IVPB     05/22/19 1302   05/22/19 1057  vancomycin variable dose per unstable renal function (pharmacist dosing)  Status:  Discontinued     05/24/19 1406   05/19/19 1030  piperacillin-tazobactam (ZOSYN) IVPB 2.25 g     05/25/19 2359   05/15/19 1230  remdesivir 100 mg in sodium chloride 0.9 % 250 mL IVPB     05/18/19 1340   05/14/19 1230  remdesivir 200 mg in sodium chloride 0.9 % 250 mL IVPB     05/14/19 1551       Devices    LINES / TUBES:      Continuous Infusions: . sodium chloride Stopped (05/27/19 0654)  . dextrose Stopped (05/27/19 1546)  . feeding supplement (OSMOLITE 1.2 CAL) 40 mL/hr at 05/28/19 0600     Objective: Vitals:   05/28/19 0000 05/28/19 0322 05/28/19 0500 05/28/19 0600  BP:  (!) 145/77    Pulse: 98 98 100 (!) 109  Resp: (!) 22 19 18 18   Temp:  98  F (36.7 C)    TempSrc:  Oral    SpO2: 95% 95% 94% 93%  Weight:  57 kg    Height:        Intake/Output Summary (Last 24 hours) at 05/28/2019 1610 Last data filed at 05/28/2019 0600 Gross per 24 hour  Intake 780.78 ml  Output 1550 ml  Net -769.22 ml   Filed Weights   05/26/19 0500 05/27/19 0400 05/28/19 0322  Weight: 59.7 kg 57.5 kg 57 kg    Examination:  General: A/O x1, does not know when, why, where), no acute respiratory distress, cachectic Eyes: negative scleral hemorrhage, negative anisocoria, negative icterus ENT: Negative Runny nose, negative gingival bleeding, Neck:  Negative scars, masses, torticollis, lymphadenopathy, JVD Lungs: Clear to auscultation bilaterally without wheezes or crackles Cardiovascular: Paced rhythm without  murmur gallop or rub normal S1 and S2 Abdomen: negative abdominal pain, nondistended, positive soft, bowel sounds, no rebound, no ascites, no appreciable mass.  Flexi-Seal in place dark watery stool (melena?) Extremities: No significant cyanosis, clubbing, or edema bilateral lower extremities Skin: Negative rashes, lesions, ulcers Psychiatric: Unable to evaluate secondary to altered mental status   Central nervous system: Spontaneously moves all extremities, does not follow commands.  Withdraws to painful stimuli  .     Data Reviewed: Care during the described time interval was provided by me .  I have reviewed this patient's available data, including medical history, events of note, physical examination, and all test results as part of my evaluation.   CBC: Recent Labs  Lab 05/24/19 0500 05/25/19 0500 05/26/19 0400 05/26/19 0452 05/26/19 1054 05/27/19 0445 05/28/19 0300  WBC 10.7* 11.3* 12.3*  --   --  15.2* 14.7*  NEUTROABS  --   --   --   --   --   --  12.3*  HGB 9.9* 9.8* 10.1* 10.5* 10.5* 11.9* 13.0  HCT 30.0* 30.7* 31.6* 31.0* 31.0* 36.9 40.3  MCV 92.9 93.3 94.6  --   --  93.2 94.6  PLT 158 162 174  --   --  211 960   Basic Metabolic Panel: Recent Labs  Lab 05/22/19 0500 05/23/19 0500 05/24/19 0500 05/25/19 0500 05/26/19 0400 05/26/19 0452 05/26/19 1054 05/27/19 0445 05/27/19 1511 05/28/19 0300  NA 147* 144 144 142 142 144 145 147*  --  149*  K 4.6 4.7 4.3 4.5 3.7 3.9 3.6 3.7  --  3.6  CL 118* 115* 113* 112* 110  --   --  116*  --  117*  CO2 18* 20* 22 21* 21*  --   --  20*  --  19*  GLUCOSE 143* 116* 97 57* 94  --   --  130*  --  115*  BUN 91* 94* 90* 93* 97*  --   --  69*  --  56*  CREATININE 3.14* 2.74* 2.38* 2.31* 2.57*  --   --  1.98*  --  1.86*  CALCIUM 8.3* 8.6* 8.7* 8.7* 8.8*  --   --  9.2  --  9.7  MG 2.2 2.1 2.0  --   --   --   --   --  1.9 2.1  PHOS  --   --   --   --   --   --   --   --  3.9 4.0   GFR: Estimated Creatinine Clearance: 22.9 mL/min  (A) (by C-G formula based on SCr of 1.86 mg/dL (H)). Liver Function Tests: Recent Labs  Lab 05/23/19 0500  05/24/19 0500 05/25/19 0500 05/27/19 0445 05/28/19 0300  AST 37 39 38 42* 38  ALT 21 23 26 31 30   ALKPHOS 57 60 55 59 68  BILITOT 0.5 0.4 0.3 0.7 0.7  PROT 5.1* 5.2* 5.2* 6.0* 6.5  ALBUMIN 2.4* 2.4* 2.4* 2.9* 3.1*   No results for input(s): LIPASE, AMYLASE in the last 168 hours. No results for input(s): AMMONIA in the last 168 hours. Coagulation Profile: No results for input(s): INR, PROTIME in the last 168 hours. Cardiac Enzymes: No results for input(s): CKTOTAL, CKMB, CKMBINDEX, TROPONINI in the last 168 hours. BNP (last 3 results) No results for input(s): PROBNP in the last 8760 hours. HbA1C: No results for input(s): HGBA1C in the last 72 hours. CBG: Recent Labs  Lab 05/27/19 1153 05/27/19 1607 05/27/19 2003 05/27/19 2354 05/28/19 0319  GLUCAP 157* 146* 135* 128* 150*   Lipid Profile: No results for input(s): CHOL, HDL, LDLCALC, TRIG, CHOLHDL, LDLDIRECT in the last 72 hours. Thyroid Function Tests: No results for input(s): TSH, T4TOTAL, FREET4, T3FREE, THYROIDAB in the last 72 hours. Anemia Panel: No results for input(s): VITAMINB12, FOLATE, FERRITIN, TIBC, IRON, RETICCTPCT in the last 72 hours. Urine analysis:    Component Value Date/Time   COLORURINE YELLOW 05/21/2019 1541   APPEARANCEUR CLEAR 05/21/2019 1541   LABSPEC 1.012 05/21/2019 1541   PHURINE 5.0 05/21/2019 1541   GLUCOSEU NEGATIVE 05/21/2019 1541   HGBUR MODERATE (A) 05/21/2019 1541   BILIRUBINUR NEGATIVE 05/21/2019 1541   KETONESUR NEGATIVE 05/21/2019 1541   PROTEINUR NEGATIVE 05/21/2019 1541   UROBILINOGEN 0.2 04/27/2015 0834   NITRITE NEGATIVE 05/21/2019 1541   LEUKOCYTESUR NEGATIVE 05/21/2019 1541   Sepsis Labs: @LABRCNTIP (procalcitonin:4,lacticidven:4)  ) Recent Results (from the past 240 hour(s))  Culture, respiratory (non-expectorated)     Status: None   Collection Time:  05/22/19  5:00 AM   Specimen: Tracheal Aspirate; Respiratory  Result Value Ref Range Status   Specimen Description   Final    TRACHEAL ASPIRATE Performed at [facility] [address], [place], [place] [address]    Special Requests   Final    NONE Performed at [facility], [place] [address] [place], [place] [address]    Gram Stain   Final    MODERATE WBC PRESENT,BOTH PMN AND MONONUCLEAR ABUNDANT GRAM POSITIVE COCCI IN PAIRS IN CLUSTERS MODERATE GRAM VARIABLE ROD    Culture   Final    FEW Consistent with normal respiratory flora. Performed at [facility], [place] [address], [place], [place] [address]    Report Status 05/24/2019 FINAL  Final  Culture, Urine     Status: None   Collection Time: 05/22/19  5:00 AM   Specimen: Urine, Clean Catch  Result Value Ref Range Status   Specimen Description   Final    URINE, CLEAN CATCH Performed at [facility], [place] [address] [place], [place] [address]    Special Requests   Final    NONE Performed at [facility], [place] [address] [place], [place] [address]    Culture   Final    NO GROWTH Performed at [facility], [place] [address] [place], [place] [address]    Report Status 05/23/2019 FINAL  Final         Radiology Studies: No results found.      Scheduled Meds: . chlorhexidine  15 mL Mouth Rinse BID  . Chlorhexidine Gluconate Cloth  6 each Topical Daily  . docusate  100 mg Oral BID  . enoxaparin (LOVENOX) injection  50 mg Subcutaneous Q24H  . feeding supplement (PRO-STAT SUGAR FREE 64)  30 mL Per Tube Daily  . insulin aspart  0-20 Units Subcutaneous Q4H  . insulin detemir  10 Units Subcutaneous Daily  . mouth rinse  15 mL Mouth Rinse q12n4p  . metoprolol tartrate  25 mg Per Tube BID  . pantoprazole (PROTONIX) IV  40 mg Intravenous Q24H  . QUEtiapine  50 mg Oral QHS  . sodium chloride flush  3 mL Intravenous Q12H   Continuous  Infusions: . sodium chloride Stopped (05/27/19 0654)  . dextrose Stopped (05/27/19 1546)  . feeding supplement (OSMOLITE 1.2 CAL) 40 mL/hr at 05/28/19 0600     LOS: 14 days   The patient is critically ill with multiple organ systems failure and requires high complexity decision making for assessment and support, frequent evaluation and titration of therapies, application of advanced monitoring technologies and extensive interpretation of multiple databases. Critical Care Time devoted to patient care services described in this note  Time spent: 40 minutes     WOODS, Geraldo Docker, MD [facility] Pager [phone]  If 7PM-7AM, please contact night-coverage [email or web address] Password [facility] 05/28/2019, 7:42 AM

## 2019-05-28 NOTE — Progress Notes (Signed)
RN called RT to come and assess pt due to increased O2 needs. Pt extubated Tuesday and able to have a full conversation with this RT,. However today she is unable to follow commands except for  stick tongue out. CCM Md made aware but is no longer following. Dr Sherral Hammers notified and at bedside along with Dr Lake Bells who was on the floor and came to see her anyways. ABG obtained and due to Decreased mentation, decision was made to bring her to ICU per Dr Lake Bells and Dr Sherral Hammers. ICU CHarge RN notified.

## 2019-05-28 NOTE — Progress Notes (Signed)
PT Cancellation Note  Patient Details Name: Betty [name] MRN: [id number] DOB: 1946-04-08   Cancelled Treatment:    Reason Eval/Treat Not Completed: Medical issues which prohibited therapy,back to ICU.   [name] 05/28/2019, 1:49 PM  [facility]  Office [phone]

## 2019-05-28 NOTE — Progress Notes (Addendum)
CRITICAL VALUE STICKER  CRITICAL VALUE: Lactic 2.1  DATE & TIME NOTIFIED: 7/23 1545  MD NOTIFIED: [place]  RESPONSE: See new orders

## 2019-05-28 NOTE — Progress Notes (Signed)
OT Cancellation Note  Patient Details Name: Betty [name] MRN: [id number] DOB: 1946-01-21   Cancelled Treatment:    Reason Eval/Treat Not Completed: Medical issues which prohibited therapy; RN reports pt initially getting K+ and lethargic requesting to hold, now noted transferred back to ICU. Will follow up for OT eval as able.   [name], OT [facility] Pager [phone] Office [phone]    Raymondo Band 05/28/2019, 12:37 PM

## 2019-05-29 DIAGNOSIS — L8990 Pressure ulcer of unspecified site, unspecified stage: Secondary | ICD-10-CM | POA: Insufficient documentation

## 2019-05-29 DIAGNOSIS — R4182 Altered mental status, unspecified: Secondary | ICD-10-CM

## 2019-05-29 LAB — GLUCOSE, CAPILLARY
Glucose-Capillary: 123 mg/dL — ABNORMAL HIGH (ref 70–99)
Glucose-Capillary: 138 mg/dL — ABNORMAL HIGH (ref 70–99)
Glucose-Capillary: 140 mg/dL — ABNORMAL HIGH (ref 70–99)
Glucose-Capillary: 147 mg/dL — ABNORMAL HIGH (ref 70–99)
Glucose-Capillary: 151 mg/dL — ABNORMAL HIGH (ref 70–99)
Glucose-Capillary: 200 mg/dL — ABNORMAL HIGH (ref 70–99)

## 2019-05-29 LAB — MAGNESIUM: Magnesium: 2.3 mg/dL (ref 1.7–2.4)

## 2019-05-29 LAB — BASIC METABOLIC PANEL
Anion gap: 8 (ref 5–15)
BUN: 53 mg/dL — ABNORMAL HIGH (ref 8–23)
CO2: 19 mmol/L — ABNORMAL LOW (ref 22–32)
Calcium: 9.7 mg/dL (ref 8.9–10.3)
Chloride: 124 mmol/L — ABNORMAL HIGH (ref 98–111)
Creatinine, Ser: 1.8 mg/dL — ABNORMAL HIGH (ref 0.44–1.00)
GFR calc Af Amer: 32 mL/min — ABNORMAL LOW (ref >60)
GFR calc non Af Amer: 27 mL/min — ABNORMAL LOW (ref >60)
Glucose, Bld: 132 mg/dL — ABNORMAL HIGH (ref 70–99)
Potassium: 4.5 mmol/L (ref 3.5–5.1)
Sodium: 151 mmol/L — ABNORMAL HIGH (ref 135–145)

## 2019-05-29 MED ORDER — IPRATROPIUM-ALBUTEROL 20-100 MCG/ACT IN AERS
1.0000 | INHALATION_SPRAY | Freq: Four times a day (QID) | RESPIRATORY_TRACT | Status: DC | PRN
  Filled 2019-05-29: qty 4

## 2019-05-29 MED ORDER — FREE WATER
300.0000 mL | Status: DC
  Administered 2019-05-29 – 2019-05-30 (×4): 300 mL

## 2019-05-29 NOTE — Evaluation (Signed)
Occupational Therapy Evaluation Patient Details Name: Betty [name] MRN: [id number] DOB: 18-Sep-1946 Today's Date: 05/29/2019    History of Present Illness Pt is a 73 y.o. female admitted 05/13/19 with AMS and weakness; diagnosed with COVID-19 oin 7/9. ETT 7/10-7/13, reintubated 7/13- 7/21; transfer out of ICU 7/22. Returned to ICU 7/23 with worsening encephalopathy. Plan to transfer to floor 7/24. PMH includes CAD, right ruptured cerebral aneurysm,non-small cell lung CA, HTN, COPD, CKD IV, CHF, dementia., PPM, R L L lobectomy., NSTEMI, afib   Clinical Impression   This 73 y/o female presents with the above. Pt presenting with significant weakness, impaired cognition, decreased activity tolerance impacting her functional performance. Pt required two person assist for bed mobility and squat pivot OOB to recliner this session. She currently requires totalA for ADL tasks including hand over hand assist for simple grooming tasks such as face washing given her UE weakness. Pt on 4L HFNC throughout session with O2 sats maintaining >92%. BP monitored (see below). She will benefit from continued acute OT services and recommend follow up therapy services in SNF setting after discharge to maximize her safety and independence with ADL and mobility. Will follow.  BP: Supine: 155/79 Sitting: 116/96 Sitting after 2 min: 139/97 Post-transfer to recliner: 149/78     Follow Up Recommendations  SNF;Supervision/Assistance - 24 hour    Equipment Recommendations  Other (comment)(TBD in next venue)           Precautions / Restrictions Precautions Precautions: Fall Precaution Comments: NGT, flexiseal, purewick Restrictions Weight Bearing Restrictions: No      Mobility Bed Mobility Overal bed mobility: Needs Assistance Bed Mobility: Supine to Sit     Supine to sit: Max assist;+2 for physical assistance     General bed mobility comments: Pt with minimal movement initiation; maxA+2 to elevate trunk and  scoot hips to EOB with bed pad  Transfers Overall transfer level: Needs assistance   Transfers: Squat Pivot Transfers     Squat pivot transfers: Total assist;+2 physical assistance     General transfer comment: TotalA to pivot to recliner with use of bed pad; maximove pad left in recliner for transfers with nursing staff    Balance Overall balance assessment: Needs assistance   Sitting balance-Leahy Scale: Poor Sitting balance - Comments: Minimal anterior weight translation noted; maxA for posterior support sitting EOB, pt easily fatigued with increased respiration rate                                   ADL either performed or assessed with clinical judgement   ADL Overall ADL's : Needs assistance/impaired Eating/Feeding: NPO   Grooming: Wash/dry face;Maximal assistance;Total assistance;Sitting Grooming Details (indicate cue type and reason): max-total hand over hand assist for simple grooming task             Lower Body Dressing: Total assistance;Bed level Lower Body Dressing Details (indicate cue type and reason): donning socks Toilet Transfer: +2 for physical assistance;Total assistance;+2 for safety/equipment;Stand-pivot Toilet Transfer Details (indicate cue type and reason): simulated via transfer to recliner         Functional mobility during ADLs: Total assistance;+2 for physical assistance;+2 for safety/equipment(stand pivot transfer) General ADL Comments: overall totalA at this time; pt with impaired cognition, significant weakness     Vision         Perception     Praxis      Pertinent Vitals/Pain Pain Assessment: No/denies pain  Hand Dominance     Extremity/Trunk Assessment Upper Extremity Assessment Upper Extremity Assessment: Generalized weakness;Difficult to assess due to impaired cognition   Lower Extremity Assessment Lower Extremity Assessment: Defer to PT evaluation;Generalized weakness;Difficult to assess due to  impaired cognition       Communication Communication Communication: Expressive difficulties(soft, mumbling speech)   Cognition Arousal/Alertness: Lethargic Behavior During Therapy: Flat affect Overall Cognitive Status: History of cognitive impairments - at baseline                                 General Comments: H/o dementia. Pt able to state first name is [name], thinks she is at home. Max cues to answer questions, but pt providing minimal responses, at times mumbling difficult to understand. Clearly states, "I'm cold" a couple times. when asking orientation questions pt stating she is at home   General Comments  SpO2 93% on 4L O2 HFNC; RR 18-33    Exercises     Shoulder Instructions      Home Living Family/patient expects to be discharged to:: Private residence Living Arrangements: Alone                               Additional Comments: Pt lethargic/confused and poor historian; per chart review lives alone. OT Eval note 2019 shows supportive family nearby      Prior Functioning/Environment          Comments: Pt with SPC sitting by bed and reports she uses this        OT Problem List: Decreased strength;Decreased range of motion;Decreased activity tolerance;Impaired balance (sitting and/or standing);Decreased cognition;Decreased safety awareness;Decreased knowledge of use of DME or AE;Decreased knowledge of precautions;Cardiopulmonary status limiting activity      OT Treatment/Interventions: Self-care/ADL training;Therapeutic exercise;Neuromuscular education;DME and/or AE instruction;Therapeutic activities;Patient/family education;Visual/perceptual remediation/compensation;Balance training;Energy conservation    OT Goals(Current goals can be found in the care plan section) Acute Rehab OT Goals Patient Stated Goal: None stated OT Goal Formulation: Patient unable to participate in goal setting Time For Goal Achievement: 06/12/19 Potential to  Achieve Goals: Fair  OT Frequency: Min 2X/week   Barriers to D/C:            Co-evaluation PT/OT/SLP Co-Evaluation/Treatment: Yes Reason for Co-Treatment: Complexity of the patient's impairments (multi-system involvement);Necessary to address cognition/behavior during functional activity;For patient/therapist safety;To address functional/ADL transfers   OT goals addressed during session: ADL's and self-care      AM-PAC OT "6 Clicks" Daily Activity     Outcome Measure Help from another person eating meals?: Total Help from another person taking care of personal grooming?: Total Help from another person toileting, which includes using toliet, bedpan, or urinal?: Total Help from another person bathing (including washing, rinsing, drying)?: Total Help from another person to put on and taking off regular upper body clothing?: Total Help from another person to put on and taking off regular lower body clothing?: Total 6 Click Score: 6   End of Session Equipment Utilized During Treatment: Oxygen Nurse Communication: Mobility status;Need for lift equipment  Activity Tolerance: Patient tolerated treatment well Patient left: in chair;with call bell/phone within reach;with nursing/sitter in room  OT Visit Diagnosis: Muscle weakness (generalized) (M62.81);Other symptoms and signs involving cognitive function;Unsteadiness on feet (R26.81)                Time: 0962-8366 OT Time Calculation (min): 28 min Charges:  OT General Charges $OT Visit: 1 Visit OT Evaluation $OT Eval Moderate Complexity: 1 Mod  [name], OT [employer] Pager [phone] Office [place] 05/29/2019, 1:57 PM

## 2019-05-29 NOTE — Progress Notes (Signed)
  Speech Language Pathology Treatment: Dysphagia  Patient Details Name: SAIGE CANTON MRN: [id number] DOB: [birth year] Today's Date: 05/29/2019 Time: 1657-9038 SLP Time Calculation (min) (ACUTE ONLY): 10 min  Assessment / Plan / Recommendation Clinical Impression  Pt seen back in ICU, pre RN has a had a fairly stable day. He reports he has head her voice and it is hoarse, but I could not elicit sufficient effort with phonation to evaluate quality. She needed cueing to take bites of ice and a few small sips of water with adequate oral manipulation and no immediate signs of aspiration. Encouraged RN to attempt ice chips intermittently if respiratory function and mentation remain stable to facilitate secretion management. Will consider upgraded trials as pt becomes more participatory and appropriate.    HPI HPI: SHANEN NORRIS is a 73 y.o. female admitted 7/9 with medical history significant of tobacco dependence; COPD, CAD; non-small cell lung CA (2016); HTN; HLD: dementia; COPD; stage IV CKD; and chronic combined CHF presenting with AMS. Intubated 7/10-7/13 and reintubated 7/13-7/21.      SLP Plan  Continue with current plan of care       Recommendations  Diet recommendations: NPO(ice chips) Liquids provided via: Teaspoon Medication Administration: Via alternative means Supervision: Full supervision/cueing for compensatory strategies Compensations: Slow rate;Small sips/bites Postural Changes and/or Swallow Maneuvers: Seated upright 90 degrees                Oral Care Recommendations: Oral care QID Follow up Recommendations: Skilled Nursing facility SLP Visit Diagnosis: Dysphagia, unspecified (R13.10) Plan: Continue with current plan of care       GO               Herbie Baltimore, MA [place] Pager [phone] Office [phone]  Lynann Beaver 05/29/2019, 3:03 PM

## 2019-05-29 NOTE — Progress Notes (Addendum)
PROGRESS NOTE    Betty [name]  JJO:[id number] DOB: 07-Jan-1946 DOA: 05/13/2019 PCP: Josetta Huddle, MD   Brief Narrative:  73 year old BF PMHx dementia, CAD, NSCLC in 2016, HTN, HLD COPD, kidney stage IV, hronic kidney disease stage IV, chronic systolic and diastolic CHF   brought to the hospital by the patient's son due to increased confusion and weakness. She was diagnosed with coronavirus in the ED on 7/9. She was also found to be hypoxic requiring 2 L nasal cannula. She was admitted to the hospital and placed on steroids along with Remdesivir, deferred to ICU on 7/10, where she required intubation, she was extubated on 7/13, where she was reintubated that same day due to encephalopathy, was extubated 7/21   Subjective: Patient noted to be much more responsive this morning.  Apparently she could not get much rest last night due to too much noise.  Patient states that she is feeling better.  She remains distracted.     Assessment & Plan:   Principal Problem:   COVID-19 Active Problems:   Non-small cell carcinoma of right lung, stage 1 (HCC)   CKD (chronic kidney disease), stage IV (HCC)   Chronic systolic heart failure (HCC)   Dementia (HCC)   Essential hypertension   Acute respiratory failure with hypoxemia (HCC)   Pressure injury of skin   Acute respiratory failure with hypoxia/COVID 19 pneumonia Recent Labs  Lab 05/23/19 0500 05/24/19 0500 05/25/19 0500  CRP 4.1* 2.0* 1.1*   Recent Labs  Lab 05/23/19 0500 05/24/19 0500 05/25/19 0500 05/26/19 0400  DDIMER 3.16* 3.75* 3.41* 3.13*  -Completed course of Zosyn and vancomycin - Completed course Remdesivir + steroids -Actemra x1 administered 7/10.   - Intubated x2 while hospitalized. -Titrate O2 to maintain SPO2 89 to 93% -DuoNeb every 6 hours  Respiratory status remained stable.  She is currently saturating in the early 90s on 4 L of oxygen by nasal cannula.  Patient was transferred back to the ICU yesterday mainly  due to altered mental status.  Was thought to be due to hyponatremia.  Patient was started on free water.  Sodium levels are pending from this morning.  Septic shock  -Venous blood gas showed saturation of 75 suggesting more of a sepsis etiology rather than cardiogenic.  Sepsis physiology has improved.  Chronic systolic and diastolic CHF -Strict in and out -Daily weight - June 2020 echocardiogram EF 30 to 35%.   Stable from a cardiac standpoint.  A. fib with RVR - On metoprolol prior to admission.  Continue with same. - Briefly on amiodarone drip. - Currently rate controlled paced rhythm - On Lovenox  Lower GI bleed - During his hospitalization patient noted to have bloody stools, gastric lavage did not show any blood in the stomach. - Transfused 1 unit PRBC - Currently blood noticed around Flexi-Seal and watery fluid within her gastric bag possibly positive for blood. - Occult blood pending  Acute blood loss anemia secondary to GI bleed  -Hemoglobin is normal.  Hypernatremia Recent Labs  Lab 05/26/19 1054 05/27/19 0445 05/28/19 0300 05/28/19 1210 05/28/19 1300  NA 145 147* 149* 148* 151*  -Trending back up, restart free water 165ml TID  Hypokalemia -Potassium goal> 4 - Potassium IV 40 mEq  Acute on CKD stage IV (baseline Cr 2- 3) Recent Labs  Lab 05/23/19 0500 05/24/19 0500 05/25/19 0500 05/26/19 0400 05/27/19 0445 05/28/19 0300 05/28/19 1300  CREATININE 2.74* 2.38* 2.31* 2.57* 1.98* 1.86* 1.98*  -At baseline  Prediabetes vs hyperglycemia -  7/1 hemoglobin A1c= 6.9 -Prior EMR has had multiple episodes of steroids in the past 1 to 2 months for acute gout as well as COVID.  Patient now off steroids - Levemir 10 units daily - Resistant SSI CBGs are reasonably well controlled.  Dementia/acute metabolic encephalopathy -  at baseline is; able to care for herself independently, yesterday post extubation was able to carry on a conversation respond  appropriately to commands. - Patient was noted to be lethargic yesterday and was transferred back to the ICU.  Seems to be more at her baseline today.  Her lethargy could have been due to electrolyte imbalance.  Will wait for metabolic panel to result from today.    Hx NSCLC/COPD -S/p RLL lobectomy + 4 cycles of adjuvant chemotherapy -Lung cancer appears to be in remission per EMR   Hx gout  -Per EMR last 1 to 82-month treated for acute gout with steroids.  Does not take steroids on a regular basis  Nutrition -Continue tube feeds until cleared by speech therapy. -Given patient's setback today would not involve speech for full dysphasia evaluation until we see a profound turnaround inpatient status.    ADDENDUM Patient's son was called and given a status update.  Goals of care discussed with the patient's son.  He was told that patient has required intubation twice.  Chances of meaningful recovery if she has respiratory decompensation and needs intubation and mechanical ventilation again would be low considering her significant comorbidities.  He is concerned about her quality of life if she does get better.  I told him that in my opinion in view of her comorbidities and acute illness, if she were to be intubated again it would be very unlikely that she will be able to return to her previous quality of life.  He understands.  He wants everything to be done for her short of intubation.  If she does get worse from a respiratory standpoint focus will be comfort care.  Swallowing function was also addressed with the patient's son.  We will see how that evolves over the next few days.   DVT prophylaxis: Lovenox Code Status: Partial Family Communication: Spoke with [name] (son) at length concerning patient's readmission to the ICU secondary to her decreasing respiratory drive. Disposition Plan: Possible transfer back to the floor if her blood work looks reasonable   Consultants:  PCCM     Procedures/Significant Events:  6/17 echocardiogram:Left Ventricle: The left ventricle has moderate-severely reduced systolic function, with an ejection fraction of 30-35%. The cavity size was normal. There is mildly increased left ventricular wall thickness. Left ventricular diastolic Doppler parameters  are consistent with impaired relaxation. Left ventricular diffuse hypokinesis.  Right Ventricle: The right ventricle has normal systolic function. The cavity was normal. There is no increase in right ventricular wall thickness. Pacing wire/catheter visualized in the right ventricle.  Left Atrium: Left atrial size was normal in size.  Right Atrium: Right atrial size was normal in size. Right atrial pressure is estimated at 10 mmHg.  Interatrial Septum: No atrial level shunt detected by color flow Doppler.  Pericardium: There is no evidence of pericardial effusion.  Mitral Valve: The mitral valve is normal in structure. Mitral valve regurgitation is not visualized by color flow Doppler.  Tricuspid Valve: The tricuspid valve is normal in structure. Tricuspid valve regurgitation is mild by color flow Doppler.  Aortic Valve: The aortic valve is normal in structure. Aortic valve regurgitation was not visualized by color flow Doppler.  Pulmonic  Valve: The pulmonic valve was normal in structure. Pulmonic valve regurgitation is trivial by color flow Doppler.  Venous: The inferior vena cava measures 0.50 cm, is normal in size with greater than 50% respiratory variability.   I have personally reviewed and interpreted all radiology studies and my findings are as above.  VENTILATOR SETTINGS:    Cultures   Antimicrobials: Anti-infectives (From admission, onward)   Start     Stop   05/22/19 1130  vancomycin (VANCOCIN) 1,250 mg in sodium chloride 0.9 % 250 mL IVPB     05/22/19 1302   05/22/19 1057  vancomycin variable dose per unstable renal function (pharmacist dosing)  Status:   Discontinued     05/24/19 1406   05/19/19 1030  piperacillin-tazobactam (ZOSYN) IVPB 2.25 g     05/25/19 2359   05/15/19 1230  remdesivir 100 mg in sodium chloride 0.9 % 250 mL IVPB     05/18/19 1340   05/14/19 1230  remdesivir 200 mg in sodium chloride 0.9 % 250 mL IVPB     05/14/19 1551       Devices    LINES / TUBES:      Continuous Infusions: . sodium chloride Stopped (05/27/19 0654)  . dextrose Stopped (05/27/19 1546)  . feeding supplement (OSMOLITE 1.2 CAL) 55 mL/hr at 05/29/19 0829     Objective: Vitals:   05/29/19 0500 05/29/19 0600 05/29/19 0700 05/29/19 0800  BP: (!) 147/65 (!) 143/73 (!) 156/77 (!) 149/79  Pulse: 88 91 92 96  Resp: 17 (!) 21 15 (!) 26  Temp:    (!) 97.5 F (36.4 C)  TempSrc:    Axillary  SpO2: 93% 95% 95% 95%  Weight:      Height:        Intake/Output Summary (Last 24 hours) at 05/29/2019 1052 Last data filed at 05/29/2019 0700 Gross per 24 hour  Intake 3807.41 ml  Output 1100 ml  Net 2707.41 ml   Filed Weights   05/26/19 0500 05/27/19 0400 05/28/19 0322  Weight: 59.7 kg 57.5 kg 57 kg    Examination:  General appearance: She is awake alert.  Distracted. Resp: Normal effort at rest.  Coarse breath sounds bilaterally.  No wheezing rhonchi.  Few crackles at the bases. Cardio: S1-S2 is normal regular.  No S3-S4.  No rubs murmurs or bruit GI: Abdomen is soft.  Nontender nondistended.  Bowel sounds are present normal.  No masses organomegaly Extremities: No edema.  Full range of motion of lower extremities. Neurologic: No focal neurological deficits.   .     Data Reviewed:   CBC: Recent Labs  Lab 05/25/19 0500 05/26/19 0400  05/26/19 1054 05/27/19 0445 05/28/19 0300 05/28/19 1210 05/28/19 1300  WBC 11.3* 12.3*  --   --  15.2* 14.7*  --  16.5*  NEUTROABS  --   --   --   --   --  12.3*  --  14.9*  HGB 9.8* 10.1*   < > 10.5* 11.9* 13.0 12.2 13.1  HCT 30.7* 31.6*   < > 31.0* 36.9 40.3 36.0 39.9  MCV 93.3 94.6  --   --   93.2 94.6  --  94.3  PLT 162 174  --   --  211 252  --  258   < > = values in this interval not displayed.   Basic Metabolic Panel: Recent Labs  Lab 05/24/19 0500 05/25/19 0500 05/26/19 0400  05/26/19 1054 05/27/19 0445 05/27/19 1511 05/28/19 0300 05/28/19 1210 05/28/19  1300 05/28/19 1630  NA 144 142 142   < > 145 147*  --  149* 148* 151*  --   K 4.3 4.5 3.7   < > 3.6 3.7  --  3.6 4.2 5.1  --   CL 113* 112* 110  --   --  116*  --  117*  --  123*  --   CO2 22 21* 21*  --   --  20*  --  19*  --  18*  --   GLUCOSE 97 57* 94  --   --  130*  --  115*  --  144*  --   BUN 90* 93* 97*  --   --  69*  --  56*  --  63*  --   CREATININE 2.38* 2.31* 2.57*  --   --  1.98*  --  1.86*  --  1.98*  --   CALCIUM 8.7* 8.7* 8.8*  --   --  9.2  --  9.7  --  9.9  --   MG 2.0  --   --   --   --   --  1.9 2.1  --  2.3 2.4  PHOS  --   --   --   --   --   --  3.9 4.0  --   --  3.8   < > = values in this interval not displayed.   GFR: Estimated Creatinine Clearance: 21.5 mL/min (A) (by C-G formula based on SCr of 1.98 mg/dL (H)). Liver Function Tests: Recent Labs  Lab 05/24/19 0500 05/25/19 0500 05/27/19 0445 05/28/19 0300 05/28/19 1300  AST 39 38 42* 38 34  ALT 23 26 31 30 27   ALKPHOS 60 55 59 68 68  BILITOT 0.4 0.3 0.7 0.7 0.9  PROT 5.2* 5.2* 6.0* 6.5 6.8  ALBUMIN 2.4* 2.4* 2.9* 3.1* 3.2*   Coagulation Profile: Recent Labs  Lab 05/28/19 1300  INR 1.1   CBG: Recent Labs  Lab 05/28/19 1636 05/28/19 1932 05/28/19 2356 05/29/19 0409 05/29/19 0735  GLUCAP 201* 192* 140* 138* 123*   Recent Results (from the past 240 hour(s))  Culture, respiratory (non-expectorated)     Status: None   Collection Time: 05/22/19  5:00 AM   Specimen: Tracheal Aspirate; Respiratory  Result Value Ref Range Status   Specimen Description   Final    TRACHEAL ASPIRATE Performed at [facility] [address], [place], [place] [address]    Special Requests   Final    NONE Performed  at [facility], [place] [address] [place], [place] [address]    Gram Stain   Final    MODERATE WBC PRESENT,BOTH PMN AND MONONUCLEAR ABUNDANT GRAM POSITIVE COCCI IN PAIRS IN CLUSTERS MODERATE GRAM VARIABLE ROD    Culture   Final    FEW Consistent with normal respiratory flora. Performed at [facility], [place] [address] [place], [place] [address]    Report Status 05/24/2019 FINAL  Final  Culture, Urine     Status: None   Collection Time: 05/22/19  5:00 AM   Specimen: Urine, Clean Catch  Result Value Ref Range Status   Specimen Description   Final    URINE, CLEAN CATCH Performed at [facility], [place] [address], [place], [place] [address]    Special Requests   Final    NONE Performed at [facility], [place] [address], [place], [place] [address]  Culture   Final    NO GROWTH Performed at [facility], [place] [address] [place], [place] [address]    Report Status 05/23/2019 FINAL  Final         Radiology Studies: Dg Chest Port 1 View  Result Date: 05/28/2019 CLINICAL DATA:  COVID-19 EXAM: PORTABLE CHEST 1 VIEW COMPARISON:  Portable exam 1312 hours compared to 05/25/2019 FINDINGS: Feeding tube tip projects over pylorus/duodenal bulb region. LEFT subclavian pacemaker leads project over RIGHT atrium, RIGHT ventricle and coronary sinus. Enlargement of cardiac silhouette. Stable mediastinal contours. Atherosclerotic calcifications aorta. Bibasilar interstitial infiltrates and atelectasis, perhaps slightly improved on LEFT. Upper lungs clear. No pleural effusion or pneumothorax. IMPRESSION: Persistent bibasilar infiltrates and atelectasis, perhaps slightly improved on LEFT. Electronically Signed   By: Lavonia Dana M.D.   On: 05/28/2019 13:34        Scheduled Meds: . chlorhexidine  15 mL Mouth Rinse BID  . Chlorhexidine Gluconate Cloth  6 each Topical Daily  . docusate  100 mg Oral BID  . enoxaparin (LOVENOX)  injection  50 mg Subcutaneous Q24H  . feeding supplement (PRO-STAT SUGAR FREE 64)  30 mL Per Tube Daily  . free water  200 mL Per Tube Q4H  . insulin aspart  0-20 Units Subcutaneous Q4H  . insulin detemir  10 Units Subcutaneous Daily  . mouth rinse  15 mL Mouth Rinse q12n4p  . metoprolol tartrate  25 mg Per Tube BID  . pantoprazole (PROTONIX) IV  40 mg Intravenous Q24H  . sodium chloride flush  3 mL Intravenous Q12H   Continuous Infusions: . sodium chloride Stopped (05/27/19 0654)  . dextrose Stopped (05/27/19 1546)  . feeding supplement (OSMOLITE 1.2 CAL) 55 mL/hr at 05/29/19 0829     LOS: 15 days     Bonnielee Haff, MD [facility] Pager on [email or web address]  If 7PM-7AM, please contact night-coverage [email or web address] Password [facility] 05/29/2019, 10:52 AM

## 2019-05-29 NOTE — Evaluation (Addendum)
Physical Therapy Evaluation Patient Details Name: Betty [name] MRN: [id number] DOB: 06-Jan-1946 Today's Date: 05/29/2019   History of Present Illness  Pt is a 73 y.o. female admitted 05/13/19 with AMS and weakness; diagnosed with COVID-19 oin 7/9. ETT 7/10-7/13, reintubated 7/13- 7/21; transfer out of ICU 7/22. Returned to ICU 7/23 with worsening encephalopathy. Plan to transfer to floor 7/24. PMH includes CAD, right ruptured cerebral aneurysm,non-small cell lung CA, HTN, COPD, CKD IV, CHF, dementia., PPM, R L L lobectomy., NSTEMI, afib.    Clinical Impression  Pt presents with an overall decrease in functional mobility secondary to above. PTA, pt ambulatory with cane; pt with lethargy/confusion and poor historian, able to state first name and that she's cold, but providing minimal responses to questions; per chart, h/o dementia. Today, pt required maxA+2 to [place] for bed mobility and transfer to recliner. Recommend use of maximove lift with nursing staff. Pt would benefit from continued acute PT services to maximize functional mobility and independence prior to d/c with SNF-level therapies.  SpO2 93% on 4L O2 HFNC RR up to 33 sitting EOB HR 98-100s  Orthostatic BPs Supine 155/79  Sitting 116/96  Sitting after 2 min 139/97  Post-transfer to recliner 149/78       Follow Up Recommendations SNF;Supervision/Assistance - 24 hour    Equipment Recommendations  (TBD)    Recommendations for Other Services       Precautions / Restrictions Precautions Precautions: Fall Precaution Comments: NGT, flexiseal, purewick Restrictions Weight Bearing Restrictions: No      Mobility  Bed Mobility Overal bed mobility: Needs Assistance Bed Mobility: Supine to Sit     Supine to sit: Max assist;+2 for physical assistance     General bed mobility comments: Pt with minimal movement initiation; maxA+2 to elevate trunk and scoot hips to EOB with bed pad  Transfers Overall transfer level: Needs  assistance   Transfers: Squat Pivot Transfers     Squat pivot transfers: Total assist;+2 physical assistance     General transfer comment: TotalA to pivot to recliner with use of bed pad; maximove pad left in recliner for transfers with nursing staff  Ambulation/Gait                Stairs            Wheelchair Mobility    Modified Rankin (Stroke Patients Only)       Balance Overall balance assessment: Needs assistance   Sitting balance-Leahy Scale: Poor Sitting balance - Comments: Minimal anterior weight translation noted; maxA for posterior support sitting EOB, pt easily fatigued with increased respiration rate                                     Pertinent Vitals/Pain Pain Assessment: No/denies pain    Home Living Family/patient expects to be discharged to:: Private residence Living Arrangements: Alone               Additional Comments: Pt lethargic/confused and poor historian; per chart review lives alone. OT Eval note 2019 shows supportive family nearby    Prior Function           Comments: Pt with SPC sitting by bed and reports she uses this     Hand Dominance        Extremity/Trunk Assessment   Upper Extremity Assessment Upper Extremity Assessment: Generalized weakness;Difficult to assess due to impaired cognition    Lower Extremity  Assessment Lower Extremity Assessment: Generalized weakness;Difficult to assess due to impaired cognition       Communication   Communication: Expressive difficulties(soft, mumbling speech)  Cognition Arousal/Alertness: Lethargic Behavior During Therapy: Flat affect Overall Cognitive Status: History of cognitive impairments - at baseline                                 General Comments: H/o dementia. 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 Max cues to answer questions, but pt providing minimal responses, at times mumbling difficult to understand.  Clearly states, "I'm cold" a couple times.      General Comments General comments (skin integrity, edema, etc.): SpO2 93% on 4L O2 HFNC; RR 18-33    Exercises     Assessment/Plan    PT Assessment Patient needs continued PT services  PT Problem List Decreased strength;Decreased activity tolerance;Decreased range of motion;Decreased balance;Decreased mobility;Decreased cognition;Decreased knowledge of use of DME;Cardiopulmonary status limiting activity       PT Treatment Interventions DME instruction;Gait training;Functional mobility training;Therapeutic activities;Therapeutic exercise;Balance training;Cognitive remediation;Patient/family education    PT Goals (Current goals can be found in the Care Plan section)  Acute Rehab PT Goals Patient Stated Goal: None stated PT Goal Formulation: With patient Time For Goal Achievement: 06/12/19    Frequency Min 2X/week   Barriers to discharge        Co-evaluation PT/OT/SLP Co-Evaluation/Treatment: Yes Reason for Co-Treatment: Complexity of the patient's impairments (multi-system involvement);Necessary to address cognition/behavior during functional activity;For patient/therapist safety;To address functional/ADL transfers PT goals addressed during session: Mobility/safety with mobility         AM-PAC PT "6 Clicks" Mobility  Outcome Measure Help needed turning from your back to your side while in a flat bed without using bedrails?: Total Help needed moving from lying on your back to sitting on the side of a flat bed without using bedrails?: Total Help needed moving to and from a bed to a chair (including a wheelchair)?: Total Help needed standing up from a chair using your arms (e.g., wheelchair or bedside chair)?: Total Help needed to walk in hospital room?: Total Help needed climbing 3-5 steps with a railing? : Total 6 Click Score: 6    End of Session Equipment Utilized During Treatment: Oxygen Activity Tolerance: Patient  tolerated treatment well Patient left: in chair;with call bell/phone within reach;with nursing/sitter in room Nurse Communication: Mobility status;Need for lift equipment PT Visit Diagnosis: Other abnormalities of gait and mobility (R26.89);Muscle weakness (generalized) (M62.81)    Time: 3154-0086 PT Time Calculation (min) (ACUTE ONLY): 28 min   Charges:   PT Evaluation $PT Eval Moderate Complexity: 1 Mod     Mabeline Caras, PT, DPT Acute Rehabilitation Services  Pager [phone] Office [place] 05/29/2019, 10:01 AM

## 2019-05-29 NOTE — Progress Notes (Signed)
NAME:  Betty [name], MRN:  [id number], DOB:  04/12/1946, LOS: 94 ADMISSION DATE:  05/13/2019, CONSULTATION DATE: July 10 REFERRING MD: Dr. Cruzita Lederer, CHIEF COMPLAINT:  Dyspnea  Brief History   73 year old female presented to the hospital with confusion, weakness all due to COVID pneumonia.  Developed worsening hypoxemia and respiratory failure so required intubation.  Was ultimately extubated but then because of encephalopathy required to be intubated again.  Developed acute kidney injury while hospitalized in the intensive care unit, never needed hemodialysis.  Was ultimately extubated again and was transferred out of the ICU on July 22.  Return to the ICU on July 23 in the setting of worsening encephalopathy.   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    Consults:  PCCM Nephrology 7/14 AKI with CKD >> s/o 7/18  Procedures:  ETT 7/10 >> 7/13 Rt IJ CVL 7/10 >> removed ETT 7/14 >> 7/21  Significant Diagnostic Tests:  04/22/19 LVEF 30-35%, LVH, LV hypokinesis, RV normal function, minimal valve insufficiencies CT head 05/14/19 > stable Rt MCA bifurcation aneurysm, atrophy, white matter disease  Micro Data:  SARS Coronavirus 2 7/09 > positive Urine 7/09 > negative Urine 7/17 > negative Sputum 7/17 > negative  Antimicrobials:  Remdesivir 7/09 >7/13  Zosyn 7/14 > 7/20 Vancomycin 7/17  Interim history/subjective:   7/24 encephalopathy improved but not resolved, sodium still elevated.    Objective   Blood pressure (!) 149/79, pulse 96, temperature (!) 97.5 F (36.4 C), temperature source Axillary, resp. rate (!) 26, height 5\' 4"  (1.626 m), weight 57 kg, SpO2 95 %.    FiO2 (%):  [40 %] 40 %   Intake/Output Summary (Last 24 hours) at 05/29/2019 1317 Last data filed at 05/29/2019 0700 Gross per 24 hour  Intake 3399.18 ml  Output 1100 ml  Net 2299.18 ml   Filed Weights   05/26/19 0500 05/27/19 0400 05/28/19 0322  Weight: 59.7 kg 57.5 kg 57 kg    Examination:  General:  Resting comfortably in bed HENT: NCAT OP clear PULM: CTA B, normal effort CV: RRR, no mgr GI: BS+, soft, nontender MSK: normal bulk and tone Neuro: awake, alert, no distress, MAEW    Resolved Hospital Problem list     Assessment & Plan:  Acute respiratory alkalosis: clinically stable Monitor respiratory status  Hypoxemic respiratory failure secondary to COVID-19 pneumonia: Oxygenation stable Continue to administer O2 to maintain O2 saturation > 17%  Chronic systolic heart failure: Tele Metoprolol to continue  Acute encephalopathy: slightly improved Minimize sedation Treat underlying metabolic, electrolyte derangements Hold quetiapine Frequent orientation Lights on during daytime  Hypernatremia Increase free water   Best practice:  Diet: N.p.o. Pain/Anxiety/Delirium protocol (if indicated): Not applicable VAP protocol (if indicated): Not applicable DVT prophylaxis: lovenox GI prophylaxis: n/a Glucose control: per TRH Mobility: bed rest for now Code Status: partial code, repeat intubation OK per family > TRH to discuss more  with family today Family Communication: per [facility] Disposition: remain in ICU  Labs   CBC: Recent Labs  Lab 05/25/19 0500 05/26/19 0400  05/26/19 1054 05/27/19 0445 05/28/19 0300 05/28/19 1210 05/28/19 1300   WBC 11.3* 12.3*  --   --  15.2* 14.7*  --  16.5*  NEUTROABS  --   --   --   --   --  12.3*  --  14.9*  HGB 9.8* 10.1*   < > 10.5* 11.9* 13.0 12.2 13.1  HCT 30.7* 31.6*   < > 31.0* 36.9 40.3 36.0 39.9  MCV 93.3 94.6  --   --  93.2 94.6  --  94.3  PLT 162 174  --   --  211 252  --  258   < > = values in this interval not displayed.    Basic Metabolic Panel: Recent Labs  Lab 05/26/19 0400  05/27/19 0445 05/27/19 1511 05/28/19 0300 05/28/19 1210 05/28/19 1300 05/28/19 1630 05/29/19 0950  NA 142   < > 147*  --  149* 148* 151*  --  151*  K 3.7   < > 3.7  --  3.6 4.2 5.1  --  4.5  CL 110  --  116*  --  117*  --  123*  --  124*  CO2 21*  --  20*  --  19*  --  18*  --  19*  GLUCOSE 94  --  130*  --  115*  --  144*  --  132*  BUN 97*  --  69*  --  56*  --  63*  --  53*  CREATININE 2.57*  --  1.98*  --  1.86*  --  1.98*  --  1.80*  CALCIUM 8.8*  --  9.2  --  9.7  --  9.9  --  9.7  MG  --   --   --  1.9 2.1  --  2.3 2.4 2.3  PHOS  --   --   --  3.9 4.0  --   --  3.8  --    < > = values in this interval not displayed.   GFR: Estimated Creatinine Clearance: 23.7 mL/min (A) (by C-G formula based on SCr of 1.8 mg/dL (H)). Recent Labs  Lab 05/23/19 0500 05/24/19 0500 05/25/19 0500 05/26/19 0400 05/27/19 0445 05/28/19 0300 05/28/19 1300  PROCALCITON 3.04 1.48 0.91  --   --   --   --   WBC 11.3* 10.7* 11.3* 12.3* 15.2* 14.7* 16.5*  LATICACIDVEN  --   --   --   --   --   --  2.1*    Liver Function Tests: Recent Labs  Lab 05/24/19 0500 05/25/19 0500 05/27/19 0445 05/28/19 0300 05/28/19 1300  AST 39 38 42* 38 34  ALT 23 26 31 30 27   ALKPHOS 60 55 59 68 68  BILITOT 0.4 0.3 0.7 0.7 0.9  PROT 5.2* 5.2* 6.0* 6.5 6.8  ALBUMIN 2.4* 2.4* 2.9* 3.1* 3.2*   No results for input(s): LIPASE, AMYLASE in the last 168 hours. No results for input(s): AMMONIA in the last 168 hours.  ABG    Component Value Date/Time   PHART 7.503 (H) 05/28/2019 1210   PCO2ART 21.7 (L) 05/28/2019 1210    PO2ART 111.0 (H) 05/28/2019 1210   HCO3 17.0 (L) 05/28/2019 1210   TCO2 18 (L) 05/28/2019 1210   ACIDBASEDEF 4.0 (H) 05/28/2019 1210   O2SAT 99.0 05/28/2019 1210  Coagulation Profile: Recent Labs  Lab 05/28/19 1300  INR 1.1    Cardiac Enzymes: No results for input(s): CKTOTAL, CKMB, CKMBINDEX, TROPONINI in the last 168 hours.  HbA1C: Hgb A1c MFr Bld  Date/Time Value Ref Range Status  05/16/2019 05:00 AM 6.9 (H) 4.8 - 5.6 % Final    Comment:    (NOTE) Pre diabetes:          5.7%-6.4% Diabetes:              >6.4% Glycemic control for   <7.0% adults with diabetes     CBG: Recent Labs  Lab 05/28/19 1932 05/28/19 2356 05/29/19 0409 05/29/19 0735 05/29/19 1152  GLUCAP 192* 140* 138* 123* 200*     Critical care time: n/a     Roselie Awkward, MD [place] [facility] Pager: [phone] Cell: [phone] If no response, call [phone]

## 2019-05-30 LAB — BASIC METABOLIC PANEL
Anion gap: 8 (ref 5–15)
BUN: 51 mg/dL — ABNORMAL HIGH (ref 8–23)
CO2: 21 mmol/L — ABNORMAL LOW (ref 22–32)
Calcium: 9.3 mg/dL (ref 8.9–10.3)
Chloride: 117 mmol/L — ABNORMAL HIGH (ref 98–111)
Creatinine, Ser: 1.84 mg/dL — ABNORMAL HIGH (ref 0.44–1.00)
GFR calc Af Amer: 31 mL/min — ABNORMAL LOW (ref >60)
GFR calc non Af Amer: 27 mL/min — ABNORMAL LOW (ref >60)
Glucose, Bld: 120 mg/dL — ABNORMAL HIGH (ref 70–99)
Potassium: 4.8 mmol/L (ref 3.5–5.1)
Sodium: 146 mmol/L — ABNORMAL HIGH (ref 135–145)

## 2019-05-30 LAB — CBC
HCT: 37.6 % (ref 36.0–46.0)
Hemoglobin: 12.2 g/dL (ref 12.0–15.0)
MCH: 31.2 pg (ref 26.0–34.0)
MCHC: 32.4 g/dL (ref 30.0–36.0)
MCV: 96.2 fL (ref 80.0–100.0)
Platelets: 199 10*3/uL (ref 150–400)
RBC: 3.91 MIL/uL (ref 3.87–5.11)
RDW: 17.2 % — ABNORMAL HIGH (ref 11.5–15.5)
WBC: 14 10*3/uL — ABNORMAL HIGH (ref 4.0–10.5)
nRBC: 0 % (ref 0.0–0.2)

## 2019-05-30 LAB — GLUCOSE, CAPILLARY
Glucose-Capillary: 105 mg/dL — ABNORMAL HIGH (ref 70–99)
Glucose-Capillary: 140 mg/dL — ABNORMAL HIGH (ref 70–99)
Glucose-Capillary: 130 mg/dL — ABNORMAL HIGH (ref 70–99)
Glucose-Capillary: 119 mg/dL — ABNORMAL HIGH (ref 70–99)
Glucose-Capillary: 157 mg/dL — ABNORMAL HIGH (ref 70–99)
Glucose-Capillary: 88 mg/dL (ref 70–99)

## 2019-05-30 MED ORDER — CHLORHEXIDINE GLUCONATE 0.12 % MT SOLN
15.0000 mL | Freq: Two times a day (BID) | OROMUCOSAL | Status: DC
  Administered 2019-05-30 – 2019-06-13 (×25): 15 mL via OROMUCOSAL
  Filled 2019-05-30 (×19): qty 15

## 2019-05-30 MED ORDER — FREE WATER
300.0000 mL | Freq: Four times a day (QID) | Status: DC
  Administered 2019-05-30 – 2019-06-06 (×28): 300 mL

## 2019-05-30 MED ORDER — ORAL CARE MOUTH RINSE: 15.0000 mL | Freq: Two times a day (BID) | OROMUCOSAL | Status: DC

## 2019-05-30 NOTE — Progress Notes (Signed)
[facility]  Seen briefly on rounds with TRH Sending to floor I adjusted free water order PCCM off  Roselie Awkward, MD [place] [facility] Pager: [phone] Cell: [phone] If no response, call [phone]

## 2019-05-30 NOTE — Progress Notes (Signed)
PROGRESS NOTE    Betty [name]  HYQ:[id number] DOB: Oct 05, 1946 DOA: 05/13/2019 PCP: Josetta Huddle, MD   Brief Narrative:  73 year old BF PMHx dementia, CAD, NSCLC in 2016, HTN, HLD COPD, kidney stage IV, hronic kidney disease stage IV, chronic systolic and diastolic CHF   brought to the hospital by the patient's son due to increased confusion and weakness. She was diagnosed with coronavirus in the ED on 7/9. She was also found to be hypoxic requiring 2 L nasal cannula. She was admitted to the hospital and placed on steroids along with Remdesivir, deferred to ICU on 7/10, where she required intubation, she was extubated on 7/13, where she was reintubated that same day due to encephalopathy, was extubated 7/21   Subjective: Patient apparently had an episode of urinary incontinence this morning and had to be cleaned out.  This apparently was very exhausting for the patient.  She is opening her eyes.  She does look at me.  She nods her head and responses to question but does not verbalize.  Moving her extremities.     Assessment & Plan:   Principal Problem:   COVID-19 Active Problems:   Non-small cell carcinoma of right lung, stage 1 (HCC)   CKD (chronic kidney disease), stage IV (HCC)   Chronic systolic heart failure (HCC)   Dementia (HCC)   Essential hypertension   Acute respiratory failure with hypoxemia (HCC)   Pressure injury of skin   Altered mental status   Acute respiratory failure with hypoxia/COVID 19 pneumonia Recent Labs  Lab 05/24/19 0500 05/25/19 0500  CRP 2.0* 1.1*   Recent Labs  Lab 05/24/19 0500 05/25/19 0500 05/26/19 0400  DDIMER 3.75* 3.41* 3.13*   - Completed course of Zosyn and vancomycin - Completed course Remdesivir + steroids - Actemra x1 administered 7/10.   - Intubated x2 while hospitalized. - Titrate O2 to maintain SPO2 89 to 93% - DuoNeb every 6 hours  Respiratory status remains stable.  She is on 4 L of oxygen by nasal cannula saturating in  the early 90s.  Altered mental status was noted 2 days ago and was transferred back to the ICU for that reason.  Most likely due to hypernatremia.  Sodium levels have improved.  Mentation seems to be close to baseline now.  Dementia/acute metabolic encephalopathy - At baseline is; able to care for herself independently.  - Patient was noted to be lethargic on 7/23 and was transferred back to the ICU.  Her sodium was noted to be elevated.  She was given free water with improvement in sodium levels.  Mentation seems to be close to baseline now.    Dysphagia Most likely due to acute medical issues.  Speech therapy is following.  Currently on tube feedings.   Septic shock  - Venous blood gas showed saturation of 75 suggesting more of a sepsis etiology rather than cardiogenic.  Sepsis physiology has improved.  Chronic systolic and diastolic CHF -Strict in and out -Daily weight - June 2020 echocardiogram EF 30 to 35%.   Remained stable from a cardiac standpoint.  A. fib with RVR - On metoprolol prior to admission.  Continue with same. - Briefly on amiodarone drip. - Currently rate controlled paced rhythm - On Lovenox  Lower GI bleed - During his hospitalization patient noted to have bloody stools, gastric lavage did not show any blood in the stomach. - Transfused 1 unit PRBC Hemoglobin is stable.  No evidence for overt bleeding.  Acute blood loss anemia secondary  to GI bleed  -Hemoglobin is normal.  Hypernatremia Recent Labs  Lab 05/28/19 0300 05/28/19 1210 05/28/19 1300 05/29/19 0950 05/30/19 0538  NA 149* 148* 151* 151* 146*  Sodium level has improved to 146 today.  Continue with free water though we will reduce the frequency of same.  Hypokalemia Potassium is normal.  Acute on CKD stage IV (baseline Cr 2- 3) Recent Labs  Lab 05/24/19 0500 05/25/19 0500 05/26/19 0400 05/27/19 0445 05/28/19 0300 05/28/19 1300 05/29/19 0950 05/30/19 0538  CREATININE 2.38* 2.31*  2.57* 1.98* 1.86* 1.98* 1.80* 1.84*   Renal function is at baseline.  Monitor urine output.  Prediabetes vs hyperglycemia - 7/1 hemoglobin A1c= 6.9 -Prior EMR has had multiple episodes of steroids in the past 1 to 2 months for acute gout as well as COVID.  Patient now off steroids - Levemir 10 units daily - Resistant SSI CBGs are reasonably well controlled.  Hx NSCLC/COPD -S/p RLL lobectomy + 4 cycles of adjuvant chemotherapy -Lung cancer appears to be in remission per EMR   Hx gout  -Per EMR last 1 to 69-month treated for acute gout with steroids.  Does not take steroids on a regular basis  Nutrition -Continue tube feeds until cleared by speech therapy. -Given patient's setback today would not involve speech for full dysphasia evaluation until we see a profound turnaround inpatient status.    Goals of care Long discussion with patient's son yesterday.  He is conflicted.  He wants his mother to get better but he does understand that she does have significant chronic comorbidities which may impair her ability to return to her usual level of activity and quality of life.  He was told that she has been intubated twice.  If she were to get worse again and she were to be intubated again her chances of meaningful recovery will be very low.  He does not want his mother to suffer anymore.  He wants to continue medical treatment without any aggressive interventions.  CODE STATUS was changed to DNR.  Speech therapy continues to follow.  She continues to get nutrition through feeding tube.  If her dysphagia does not improve will need to have another discussion with patient's son to discuss potential transition to hospice and comfort care.   DVT prophylaxis: Lovenox Code Status: DNR Family Communication: Discussed with patient's son yesterday.  Will call him again today.   Disposition Plan: Transfer to progressive care unit   Consultants:  PCCM    Procedures/Significant Events:  6/17  echocardiogram:Left Ventricle: The left ventricle has moderate-severely reduced systolic function, with an ejection fraction of 30-35%. The cavity size was normal. There is mildly increased left ventricular wall thickness. Left ventricular diastolic Doppler parameters  are consistent with impaired relaxation. Left ventricular diffuse hypokinesis.  Right Ventricle: The right ventricle has normal systolic function. The cavity was normal. There is no increase in right ventricular wall thickness. Pacing wire/catheter visualized in the right ventricle.  Left Atrium: Left atrial size was normal in size.  Right Atrium: Right atrial size was normal in size. Right atrial pressure is estimated at 10 mmHg.  Interatrial Septum: No atrial level shunt detected by color flow Doppler.  Pericardium: There is no evidence of pericardial effusion.  Mitral Valve: The mitral valve is normal in structure. Mitral valve regurgitation is not visualized by color flow Doppler.  Tricuspid Valve: The tricuspid valve is normal in structure. Tricuspid valve regurgitation is mild by color flow Doppler.  Aortic Valve: The aortic valve is  normal in structure. Aortic valve regurgitation was not visualized by color flow Doppler.  Pulmonic Valve: The pulmonic valve was normal in structure. Pulmonic valve regurgitation is trivial by color flow Doppler.  Venous: The inferior vena cava measures 0.50 cm, is normal in size with greater than 50% respiratory variability.   I have personally reviewed and interpreted all radiology studies and my findings are as above.  VENTILATOR SETTINGS:    Cultures   Antimicrobials: Anti-infectives (From admission, onward)   Start     Stop   05/22/19 1130  vancomycin (VANCOCIN) 1,250 mg in sodium chloride 0.9 % 250 mL IVPB     05/22/19 1302   05/22/19 1057  vancomycin variable dose per unstable renal function (pharmacist dosing)  Status:  Discontinued     05/24/19 1406    05/19/19 1030  piperacillin-tazobactam (ZOSYN) IVPB 2.25 g     05/25/19 2359   05/15/19 1230  remdesivir 100 mg in sodium chloride 0.9 % 250 mL IVPB     05/18/19 1340   05/14/19 1230  remdesivir 200 mg in sodium chloride 0.9 % 250 mL IVPB     05/14/19 1551       Devices    LINES / TUBES:      Continuous Infusions:  sodium chloride Stopped (05/27/19 0654)   dextrose Stopped (05/27/19 1546)   feeding supplement (OSMOLITE 1.2 CAL) 1,000 mL (05/29/19 2330)     Objective: Vitals:   05/30/19 0100 05/30/19 0200 05/30/19 0400 05/30/19 0453  BP: 136/68 138/61    Pulse: 78 81    Resp:  16    Temp:   98.1 F (36.7 C)   TempSrc:   Axillary   SpO2: 96% 98%    Weight:    47.1 kg  Height:        Intake/Output Summary (Last 24 hours) at 05/30/2019 0944 Last data filed at 05/30/2019 0730 Gross per 24 hour  Intake 2520 ml  Output 1125 ml  Net 1395 ml   Filed Weights   05/27/19 0400 05/28/19 0322 05/30/19 0453  Weight: 57.5 kg 57 kg 47.1 kg    Examination:  General appearance: Awake alert.  Distracted.  Follow certain commands. Resp: Normal effort at rest.  Coarse breath sounds bilaterally.  No wheezing or rhonchi.  Few crackles at the bases.   Cardio: S1-S2 is normal regular.  No S3-S4.  No rubs murmurs or bruit GI: Abdomen is soft.  Nontender nondistended.  Bowel sounds are present normal.  No masses organomegaly Extremities: No edema.  Full range of motion of lower extremities. Neurologic: Not very communicative today.  But no obvious neurological deficits noted.    Data Reviewed:   CBC: Recent Labs  Lab 05/26/19 0400  05/27/19 0445 05/28/19 0300 05/28/19 1210 05/28/19 1300 05/30/19 0538  WBC 12.3*  --  15.2* 14.7*  --  16.5* 14.0*  NEUTROABS  --   --   --  12.3*  --  14.9*  --   HGB 10.1*   < > 11.9* 13.0 12.2 13.1 12.2  HCT 31.6*   < > 36.9 40.3 36.0 39.9 37.6  MCV 94.6  --  93.2 94.6  --  94.3 96.2  PLT 174  --  211 252  --  258 199   < > = values in  this interval not displayed.   Basic Metabolic Panel: Recent Labs  Lab 05/27/19 0445 05/27/19 1511 05/28/19 0300 05/28/19 1210 05/28/19 1300 05/28/19 1630 05/29/19 0950 05/30/19 0538  NA  147*  --  149* 148* 151*  --  151* 146*  K 3.7  --  3.6 4.2 5.1  --  4.5 4.8  CL 116*  --  117*  --  123*  --  124* 117*  CO2 20*  --  19*  --  18*  --  19* 21*  GLUCOSE 130*  --  115*  --  144*  --  132* 120*  BUN 69*  --  56*  --  63*  --  53* 51*  CREATININE 1.98*  --  1.86*  --  1.98*  --  1.80* 1.84*  CALCIUM 9.2  --  9.7  --  9.9  --  9.7 9.3  MG  --  1.9 2.1  --  2.3 2.4 2.3  --   PHOS  --  3.9 4.0  --   --  3.8  --   --    GFR: Estimated Creatinine Clearance: 19.9 mL/min (A) (by C-G formula based on SCr of 1.84 mg/dL (H)).  Liver Function Tests: Recent Labs  Lab 05/24/19 0500 05/25/19 0500 05/27/19 0445 05/28/19 0300 05/28/19 1300  AST 39 38 42* 38 34  ALT 23 26 31 30 27   ALKPHOS 60 55 59 68 68  BILITOT 0.4 0.3 0.7 0.7 0.9  PROT 5.2* 5.2* 6.0* 6.5 6.8  ALBUMIN 2.4* 2.4* 2.9* 3.1* 3.2*   Coagulation Profile: Recent Labs  Lab 05/28/19 1300  INR 1.1   CBG: Recent Labs  Lab 05/29/19 1633 05/29/19 2216 05/30/19 0019 05/30/19 0407 05/30/19 0839  GLUCAP 147* 151* 105* 140* 130*   Recent Results (from the past 240 hour(s))  Culture, respiratory (non-expectorated)     Status: None   Collection Time: 05/22/19  5:00 AM   Specimen: Tracheal Aspirate; Respiratory  Result Value Ref Range Status   Specimen Description   Final    TRACHEAL ASPIRATE Performed at [place] [address] [place], [place] [address]    Special Requests   Final    NONE Performed at [facility], [place] [address], [place], [place] [address]    Gram Stain   Final    MODERATE WBC PRESENT,BOTH PMN AND MONONUCLEAR ABUNDANT GRAM POSITIVE COCCI IN PAIRS IN CLUSTERS MODERATE GRAM VARIABLE ROD    Culture   Final    FEW Consistent with normal respiratory  flora. Performed at [facility], [place] [address] [place], [place] [address]    Report Status 05/24/2019 FINAL  Final  Culture, Urine     Status: None   Collection Time: 05/22/19  5:00 AM   Specimen: Urine, Clean Catch  Result Value Ref Range Status   Specimen Description   Final    URINE, CLEAN CATCH Performed at [facility], [place] [address], [place], [place] [address]    Special Requests   Final    NONE Performed at [facility], [place] [address] [place], [place] [address]    Culture   Final    NO GROWTH Performed at [facility], [place] [address], [place], [place] [address]    Report Status 05/23/2019 FINAL  Final  Culture, blood (routine x 2)     Status: None (Preliminary result)   Collection Time: 05/28/19  4:30 PM   Specimen: BLOOD  Result Value Ref Range Status   Specimen Description   Final    BLOOD LEFT HAND Performed at [facility] [address]  [place], [place] [address]    Special Requests   Final    BOTTLES DRAWN AEROBIC ONLY Blood Culture adequate volume Performed at [place] [address], [place], [place] [address]    Culture   Final    NO GROWTH 2 DAYS Performed at [place] [address], [place], [place] [address]    Report Status PENDING  Incomplete  Culture, blood (routine x 2)     Status: None (Preliminary result)   Collection Time: 05/28/19  4:36 PM   Specimen: BLOOD  Result Value Ref Range Status   Specimen Description   Final    BLOOD RIGHT HAND Performed at [facility] [address], [place], [place] [address]    Special Requests   Final    BOTTLES DRAWN AEROBIC ONLY Blood Culture adequate volume Performed at [place] [address], [place], [place] [address]    Culture   Final    NO GROWTH 2 DAYS Performed at [place] [address], [place],  [address]    Report  Status PENDING  Incomplete         Radiology Studies: Dg Chest Port 1 View  Result Date: 05/28/2019 CLINICAL DATA:  COVID-19 EXAM: PORTABLE CHEST 1 VIEW COMPARISON:  Portable exam 1312 hours compared to 05/25/2019 FINDINGS: Feeding tube tip projects over pylorus/duodenal bulb region. LEFT subclavian pacemaker leads project over RIGHT atrium, RIGHT ventricle and coronary sinus. Enlargement of cardiac silhouette. Stable mediastinal contours. Atherosclerotic calcifications aorta. Bibasilar interstitial infiltrates and atelectasis, perhaps slightly improved on LEFT. Upper lungs clear. No pleural effusion or pneumothorax. IMPRESSION: Persistent bibasilar infiltrates and atelectasis, perhaps slightly improved on LEFT. Electronically Signed   By: Lavonia Dana M.D.   On: 05/28/2019 13:34        Scheduled Meds:  chlorhexidine  15 mL Mouth Rinse BID   Chlorhexidine Gluconate Cloth  6 each Topical Daily   docusate  100 mg Oral BID   enoxaparin (LOVENOX) injection  50 mg Subcutaneous Q24H   feeding supplement (PRO-STAT SUGAR FREE 64)  30 mL Per Tube Daily   free water  300 mL Per Tube Q6H   insulin aspart  0-20 Units Subcutaneous Q4H   insulin detemir  10 Units Subcutaneous Daily   mouth rinse  15 mL Mouth Rinse q12n4p   metoprolol tartrate  25 mg Per Tube BID   pantoprazole (PROTONIX) IV  40 mg Intravenous Q24H   sodium chloride flush  3 mL Intravenous Q12H   Continuous Infusions:  sodium chloride Stopped (05/27/19 0654)   dextrose Stopped (05/27/19 1546)   feeding supplement (OSMOLITE 1.2 CAL) 1,000 mL (05/29/19 2330)     LOS: 16 days     Bonnielee Haff, MD [facility] Pager on [email or web address]  If 7PM-7AM, please contact night-coverage [email or web address] Password [facility] 05/30/2019, 9:44 AM

## 2019-05-30 NOTE — Plan of Care (Signed)
  Problem: Clinical Measurements: Goal: Ability to maintain clinical measurements within normal limits will improve Outcome: Progressing Goal: Will remain free from infection Outcome: Progressing Goal: Diagnostic test results will improve Outcome: Progressing Goal: Respiratory complications will improve Outcome: Progressing Goal: Cardiovascular complication will be avoided Outcome: Progressing   Problem: Nutrition: Goal: Adequate nutrition will be maintained Outcome: Progressing   Problem: Coping: Goal: Level of anxiety will decrease Outcome: Progressing   Problem: Elimination: Goal: Will not experience complications related to bowel motility Outcome: Progressing Goal: Will not experience complications related to urinary retention Outcome: Progressing   Problem: Pain Managment: Goal: General experience of comfort will improve Outcome: Progressing   Problem: Safety: Goal: Ability to remain free from injury will improve Outcome: Progressing   Problem: Education: Goal: Knowledge of General Education information will improve Description: Including pain rating scale, medication(s)/side effects and non-pharmacologic comfort measures Outcome: Not Progressing   Problem: Health Behavior/Discharge Planning: Goal: Ability to manage health-related needs will improve Outcome: Not Progressing   Problem: Activity: Goal: Risk for activity intolerance will decrease Outcome: Not Progressing

## 2019-05-30 NOTE — Progress Notes (Signed)
ANTICOAGULATION CONSULT NOTE - Follow Up Consult  Pharmacy Consult for Lovenox Indication: atrial fibrillation  Allergies  Allergen Reactions  . Sulfa Antibiotics Itching    Patient Measurements: Height: 5\' 4"  (162.6 cm) Weight: 103 lb 13.4 oz (47.1 kg) IBW/kg (Calculated) : 54.7  Vital Signs: Temp: 97.7 F (36.5 C) (07/25 1213) Temp Source: Axillary (07/25 1213) BP: 130/69 (07/25 1230) Pulse Rate: 76 (07/25 1230)  Labs: Recent Labs    05/27/19 1815  05/28/19 0300 05/28/19 1210 05/28/19 1300 05/29/19 0950 05/30/19 0538  HGB  --    < > 13.0 12.2 13.1  --  12.2  HCT  --    < > 40.3 36.0 39.9  --  37.6  PLT  --   --  252  --  258  --  199  LABPROT  --   --   --   --  14.3  --   --   INR  --   --   --   --  1.1  --   --   HEPRLOWMOCWT 1.38  --   --   --   --   --   --   CREATININE  --    < > 1.86*  --  1.98* 1.80* 1.84*   < > = values in this interval not displayed.    Estimated Creatinine Clearance: 19.9 mL/min (A) (by C-G formula based on SCr of 1.84 mg/dL (H)).   Assessment: 73 y.o. female admitted on 05/13/2019 with COVID-19 pneumonia, requiring intubation.  Patient developed atrial fibrillation, started on amiodarone which was stopped, was being AV paced. Has been on VTE prophylaxis with SQ heparin, then developed GI bleed on 7/17 with Hgb decreased 7.5 for which she was transfused.  No evidencey of further bleeding and Hgb better so full-dose Lovenox started for afib.  Hgb 11.9 (improving), plt up to 211. SCr down to 1.98, est CrCl 20 ml/min  05/30/19 12:53 PM  - anti-Xa= 1.38 - no bleeding reported  Level above goal 7/22  CHA2DS2/VAS= 5    Goal of Therapy:  Anti-Xa level 0.6-1 units/ml (4 hours post dose) Monitor platelets by anticoagulation protocol: Yes   Plan:  - dose decreased to lovenox to 50 mg daily 7/22 - Consider apixaban or IV UFH if rapidly reversible agent desired in setting of recent GIB and acute on CKD - Repeat level as renal function  improves/stabilizes - holding off on level as dose held 7/23 2/2 bruising - CBC at least q 72h - Monitor for bleeding   Ulice Dash, PharmD, BCPS Clinical Pharmacist  05/30/2019,12:53 PM

## 2019-05-30 NOTE — Progress Notes (Signed)
Patient transferred from ICU, drowsy but arousable, answering yes/no questions and following commands, oriented to self only. Son, Starlena Beil, called, advised of plan of care for shift and answered questions.  Patient oriented to room and given call bell.

## 2019-05-31 DIAGNOSIS — J1289 Other viral pneumonia: Secondary | ICD-10-CM

## 2019-05-31 LAB — CBC WITH DIFFERENTIAL/PLATELET
Abs Immature Granulocytes: 0.09 10*3/uL — ABNORMAL HIGH (ref 0.00–0.07)
Basophils Absolute: 0 10*3/uL (ref 0.0–0.1)
Basophils Relative: 0 %
Eosinophils Absolute: 0.2 10*3/uL (ref 0.0–0.5)
Eosinophils Relative: 1 %
HCT: 37.2 % (ref 36.0–46.0)
Hemoglobin: 12.2 g/dL (ref 12.0–15.0)
Immature Granulocytes: 1 %
Lymphocytes Relative: 12 %
Lymphs Abs: 1.7 10*3/uL (ref 0.7–4.0)
MCH: 31 pg (ref 26.0–34.0)
MCHC: 32.8 g/dL (ref 30.0–36.0)
MCV: 94.7 fL (ref 80.0–100.0)
Monocytes Absolute: 0.5 10*3/uL (ref 0.1–1.0)
Monocytes Relative: 3 %
Neutro Abs: 11.7 10*3/uL — ABNORMAL HIGH (ref 1.7–7.7)
Neutrophils Relative %: 83 %
Platelets: 218 10*3/uL (ref 150–400)
RBC: 3.93 MIL/uL (ref 3.87–5.11)
RDW: 16.9 % — ABNORMAL HIGH (ref 11.5–15.5)
WBC: 14.2 10*3/uL — ABNORMAL HIGH (ref 4.0–10.5)
nRBC: 0 % (ref 0.0–0.2)

## 2019-05-31 LAB — COMPREHENSIVE METABOLIC PANEL
ALT: 27 U/L (ref 0–44)
AST: 29 U/L (ref 15–41)
Albumin: 2.9 g/dL — ABNORMAL LOW (ref 3.5–5.0)
Alkaline Phosphatase: 64 U/L (ref 38–126)
Anion gap: 13 (ref 5–15)
BUN: 51 mg/dL — ABNORMAL HIGH (ref 8–23)
CO2: 21 mmol/L — ABNORMAL LOW (ref 22–32)
Calcium: 9.3 mg/dL (ref 8.9–10.3)
Chloride: 111 mmol/L (ref 98–111)
Creatinine, Ser: 1.92 mg/dL — ABNORMAL HIGH (ref 0.44–1.00)
GFR calc Af Amer: 29 mL/min — ABNORMAL LOW (ref >60)
GFR calc non Af Amer: 25 mL/min — ABNORMAL LOW (ref >60)
Glucose, Bld: 130 mg/dL — ABNORMAL HIGH (ref 70–99)
Potassium: 4.9 mmol/L (ref 3.5–5.1)
Sodium: 145 mmol/L (ref 135–145)
Total Bilirubin: 0.4 mg/dL (ref 0.3–1.2)
Total Protein: 6 g/dL — ABNORMAL LOW (ref 6.5–8.1)

## 2019-05-31 LAB — BASIC METABOLIC PANEL
Anion gap: 12 (ref 5–15)
BUN: 52 mg/dL — ABNORMAL HIGH (ref 8–23)
CO2: 21 mmol/L — ABNORMAL LOW (ref 22–32)
Calcium: 9.4 mg/dL (ref 8.9–10.3)
Chloride: 112 mmol/L — ABNORMAL HIGH (ref 98–111)
Creatinine, Ser: 1.9 mg/dL — ABNORMAL HIGH (ref 0.44–1.00)
GFR calc Af Amer: 30 mL/min — ABNORMAL LOW (ref >60)
GFR calc non Af Amer: 26 mL/min — ABNORMAL LOW (ref >60)
Glucose, Bld: 106 mg/dL — ABNORMAL HIGH (ref 70–99)
Potassium: 4.4 mmol/L (ref 3.5–5.1)
Sodium: 145 mmol/L (ref 135–145)

## 2019-05-31 LAB — CBC
HCT: 39 % (ref 36.0–46.0)
Hemoglobin: 12.3 g/dL (ref 12.0–15.0)
MCH: 30.3 pg (ref 26.0–34.0)
MCHC: 31.5 g/dL (ref 30.0–36.0)
MCV: 96.1 fL (ref 80.0–100.0)
Platelets: 215 10*3/uL (ref 150–400)
RBC: 4.06 MIL/uL (ref 3.87–5.11)
RDW: 17 % — ABNORMAL HIGH (ref 11.5–15.5)
WBC: 13.5 10*3/uL — ABNORMAL HIGH (ref 4.0–10.5)
nRBC: 0 % (ref 0.0–0.2)

## 2019-05-31 LAB — GLUCOSE, CAPILLARY
Glucose-Capillary: 109 mg/dL — ABNORMAL HIGH (ref 70–99)
Glucose-Capillary: 112 mg/dL — ABNORMAL HIGH (ref 70–99)
Glucose-Capillary: 138 mg/dL — ABNORMAL HIGH (ref 70–99)
Glucose-Capillary: 150 mg/dL — ABNORMAL HIGH (ref 70–99)

## 2019-05-31 LAB — C-REACTIVE PROTEIN: CRP: <0.8 mg/dL (ref <1.0)

## 2019-05-31 LAB — ABO/RH: ABO/RH(D): A POS

## 2019-05-31 LAB — PROCALCITONIN: Procalcitonin: <0.1 ng/mL

## 2019-05-31 LAB — MAGNESIUM: Magnesium: 2 mg/dL (ref 1.7–2.4)

## 2019-05-31 LAB — D-DIMER, QUANTITATIVE: D-Dimer, Quant: 1.48 ug/mL-FEU — ABNORMAL HIGH (ref 0.00–0.50)

## 2019-05-31 MED ORDER — IPRATROPIUM-ALBUTEROL 20-100 MCG/ACT IN AERS
1.0000 | INHALATION_SPRAY | Freq: Four times a day (QID) | RESPIRATORY_TRACT | Status: DC
  Administered 2019-05-31 – 2019-06-13 (×38): 1 via RESPIRATORY_TRACT

## 2019-05-31 NOTE — Progress Notes (Signed)
Telephone call to patient's son, Makyla Bye, updated on plan of care for shift and answered all questions.

## 2019-05-31 NOTE — Progress Notes (Signed)
PROGRESS NOTE    Betty [name]  AJO:[id number] DOB: [birth year] DOA: 05/13/2019 PCP: Josetta Huddle, MD   Brief Narrative:  73 year old BF PMHx dementia, CAD, NSCLC in 2016, HTN, HLD COPD, kidney stage IV, hronic kidney disease stage IV, chronic systolic and diastolic CHF   brought to the hospital by the patient's son due to increased confusion and weakness. She was diagnosed with coronavirus in the ED on 7/9. She was also found to be hypoxic requiring 2 L nasal cannula. She was admitted to the hospital and placed on steroids along with Remdesivir, deferred to ICU on 7/10, where she required intubation, she was extubated on 7/13, where she was reintubated that same day due to encephalopathy, was extubated 7/21   Subjective: 7/26 A/O x1 (does not know where, when, why).  Follows commands.   Assessment & Plan:   Principal Problem:   COVID-19 Active Problems:   Non-small cell carcinoma of right lung, stage 1 (HCC)   CKD (chronic kidney disease), stage IV (HCC)   Chronic systolic heart failure (HCC)   Dementia (HCC)   Essential hypertension   Acute respiratory failure with hypoxemia (HCC)   Pressure injury of skin   Altered mental status   Acute respiratory failure with hypoxia/COVID 19 pneumonia Recent Labs  Lab 05/25/19 0500 05/31/19 0739  CRP 1.1* <0.8   Recent Labs  Lab 05/25/19 0500 05/26/19 0400 05/31/19 0404  DDIMER 3.41* 3.13* 1.48*  -Completed course of antibiotics, Remdesavir+ Steroids --Actemra x1 administered 7/10.   - Intubated x2 while hospitalized. -Titrate O2 to maintain SPO2 89 to 93% -DuoNeb every 6 hours  Septic shock  -Venous blood gas showed saturation of 75 suggesting more of a sepsis etiology rather than cardiogenic. - Resolved  Chronic systolic and diastolic CHF -Strict in and out +11.3 L -Daily weight Filed Weights   05/28/19 0322 05/30/19 0453 05/31/19 0449  Weight: 57 kg 47.1 kg 57.4 kg  - June 2020 echocardiogram EF 30 to 35%.     A. fib with RVR - On metoprolol prior to admission.  Continue metoprolol 25 mg twice daily - Briefly on amiodarone drip. - Currently rate controlled paced rhythm - On Lovenox  Lower GI bleed - During his hospitalization patient noted to have bloody stools, gastric lavage did not show any blood in the stomach. - Transfuse 1 unit PRBC - Currently blood noticed around Flexi-Seal and watery fluid within her gastric bag possibly positive for blood. - Occult blood pending  Acute blood loss anemia secondary to GI bleed  -GI bleed may be reoccurring.  See lower GI bleed Recent Labs  Lab 05/28/19 1210 05/28/19 1300 05/30/19 0538 05/31/19 0404 05/31/19 0815  HGB 12.2 13.1 12.2 12.3 12.2  -Currently stable  Hypernatremia Recent Labs  Lab 05/28/19 1210 05/28/19 1300 05/29/19 0950 05/30/19 0538 05/31/19 0404  NA 148* 151* 151* 146* 145  -Continue free water 300 ml every 6 hours   Hypokalemia -Potassium goal> 4  Acute on CKD stage IV (baseline Cr 2- 3) Recent Labs  Lab 05/25/19 0500 05/26/19 0400 05/27/19 0445 05/28/19 0300 05/28/19 1300 05/29/19 0950 05/30/19 0538 05/31/19 0404  CREATININE 2.31* 2.57* 1.98* 1.86* 1.98* 1.80* 1.84* 1.90*  - Better than baseline  Prediabetes vs hyperglycemia - 7/1 hemoglobin A1c= 6.9 -Prior EMR has had multiple episodes of steroids in the past 1 to 2 months for acute gout as well as COVID.  Patient now off steroids - Levemir 10 units daily - Resistant SSI  Dementia/acute metabolic encephalopathy -  Discussed case with PCCM staff patient's baseline is; able to care for herself independently, yesterday post extubation was able to carry on a conversation respond appropriately to commands. - Today's cognitive evaluation showed a marked difference, patient was lethargic open her eyes when her name was called but did not follow commands.  Withdrew to painful stimuli. -Patient still very confused though awake dementia vs hospital delirium   Hx NSCLC/COPD -S/p RLL lobectomy + 4 cycles of adjuvant chemotherapy -Lung cancer appears to be in remission per EMR   Hx gout  -Per EMR last 1 to 42-month treated for acute gout with steroids.  Does not take steroids on a regular basis -Uric acid pending  Nutrition -Continue tube feeds until cleared by speech therapy. -Given patient's setback today would not involve speech for full dysphasia evaluation until we see a profound turnaround inpatient status.      DVT prophylaxis: Lovenox Code Status: Partial Family Communication: None.   Disposition Plan: TBD   Consultants:  PCCM    Procedures/Significant Events:  6/17 echocardiogram:Left Ventricle: The left ventricle has moderate-severely reduced systolic function. -EF 30-35%.  -Left ventricular diffuse hypokinesis.    I have personally reviewed and interpreted all radiology studies and my findings are as above.  VENTILATOR SETTINGS:    Cultures   Antimicrobials: Anti-infectives (From admission, onward)   Start     Stop   05/22/19 1130  vancomycin (VANCOCIN) 1,250 mg in sodium chloride 0.9 % 250 mL IVPB     05/22/19 1302   05/22/19 1057  vancomycin variable dose per unstable renal function (pharmacist dosing)  Status:  Discontinued     05/24/19 1406   05/19/19 1030  piperacillin-tazobactam (ZOSYN) IVPB 2.25 g     05/25/19 2359   05/15/19 1230  remdesivir 100 mg in sodium chloride 0.9 % 250 mL IVPB     05/18/19 1340   05/14/19 1230  remdesivir 200 mg in sodium chloride 0.9 % 250 mL IVPB     05/14/19 1551      Devices    LINES / TUBES:      Continuous Infusions: . sodium chloride Stopped (05/27/19 0654)  . dextrose Stopped (05/27/19 1546)  . feeding supplement (OSMOLITE 1.2 CAL) 55 mL/hr at 05/31/19 0700     Objective: Vitals:   05/30/19 2357 05/31/19 0424 05/31/19 0449 05/31/19 0727  BP: 121/66 133/71    Pulse:      Resp:      Temp: 98.6 F (37 C)  99.2 F (37.3 C) (!) 97.5 F (36.4 C)   TempSrc: Axillary  Rectal Axillary  SpO2: 90% 91%    Weight:   57.4 kg   Height:        Intake/Output Summary (Last 24 hours) at 05/31/2019 0734 Last data filed at 05/31/2019 0400 Gross per 24 hour  Intake 1310 ml  Output 1150 ml  Net 160 ml   Filed Weights   05/28/19 0322 05/30/19 0453 05/31/19 0449  Weight: 57 kg 47.1 kg 57.4 kg   Physical Exam:  General: A/O x1 (does not know where, when, why) follows commands positive acute respiratory distress (currently on 2 L O2 via [place]: SPO2= 91%) Eyes: negative scleral hemorrhage, negative anisocoria, negative icterus ENT: Negativ ,  Runny nose, negative gingival bleeding, Neck:  Negative scars, masses, torticollis, lymphadenopathy, JVD Lungs: decreased breath sounds right lung field, LEFT lung clear to auscultation, without wheezes or crackles Cardiovascular: Regular rate and rhythm without murmur gallop or rub normal S1 and S2 Abdomen:  negative abdominal pain, nondistended, positive soft, bowel sounds, no rebound, no ascites, no appreciable mass Extremities: No significant cyanosis, clubbing, or edema bilateral lower extremities Skin: Negative rashes, lesions, ulcers Psychiatric: Unable to assess secondary to altered mental status   Central nervous system: Spontaneously moves all extremities, follows some commands.  .     Data Reviewed: Care during the described time interval was provided by me .  I have reviewed this patient's available data, including medical history, events of note, physical examination, and all test results as part of my evaluation.   CBC: Recent Labs  Lab 05/27/19 0445 05/28/19 0300 05/28/19 1210 05/28/19 1300 05/30/19 0538 05/31/19 0404  WBC 15.2* 14.7*  --  16.5* 14.0* 13.5*  NEUTROABS  --  12.3*  --  14.9*  --   --   HGB 11.9* 13.0 12.2 13.1 12.2 12.3  HCT 36.9 40.3 36.0 39.9 37.6 39.0  MCV 93.2 94.6  --  94.3 96.2 96.1  PLT 211 252  --  258 199 353   Basic Metabolic Panel: Recent Labs  Lab  05/27/19 1511 05/28/19 0300 05/28/19 1210 05/28/19 1300 05/28/19 1630 05/29/19 0950 05/30/19 0538 05/31/19 0404  NA  --  149* 148* 151*  --  151* 146* 145  K  --  3.6 4.2 5.1  --  4.5 4.8 4.4  CL  --  117*  --  123*  --  124* 117* 112*  CO2  --  19*  --  18*  --  19* 21* 21*  GLUCOSE  --  115*  --  144*  --  132* 120* 106*  BUN  --  56*  --  63*  --  53* 51* 52*  CREATININE  --  1.86*  --  1.98*  --  1.80* 1.84* 1.90*  CALCIUM  --  9.7  --  9.9  --  9.7 9.3 9.4  MG 1.9 2.1  --  2.3 2.4 2.3  --   --   PHOS 3.9 4.0  --   --  3.8  --   --   --    GFR: Estimated Creatinine Clearance: 22.4 mL/min (A) (by C-G formula based on SCr of 1.9 mg/dL (H)). Liver Function Tests: Recent Labs  Lab 05/25/19 0500 05/27/19 0445 05/28/19 0300 05/28/19 1300  AST 38 42* 38 34  ALT 26 31 30 27   ALKPHOS 55 59 68 68  BILITOT 0.3 0.7 0.7 0.9  PROT 5.2* 6.0* 6.5 6.8  ALBUMIN 2.4* 2.9* 3.1* 3.2*   No results for input(s): LIPASE, AMYLASE in the last 168 hours. No results for input(s): AMMONIA in the last 168 hours. Coagulation Profile: Recent Labs  Lab 05/28/19 1300  INR 1.1   Cardiac Enzymes: No results for input(s): CKTOTAL, CKMB, CKMBINDEX, TROPONINI in the last 168 hours. BNP (last 3 results) No results for input(s): PROBNP in the last 8760 hours. HbA1C: No results for input(s): HGBA1C in the last 72 hours. CBG: Recent Labs  Lab 05/30/19 1208 05/30/19 1612 05/30/19 2059 05/30/19 2356 05/31/19 0413  GLUCAP 119* 157* 88 150* 109*   Lipid Profile: No results for input(s): CHOL, HDL, LDLCALC, TRIG, CHOLHDL, LDLDIRECT in the last 72 hours. Thyroid Function Tests: No results for input(s): TSH, T4TOTAL, FREET4, T3FREE, THYROIDAB in the last 72 hours. Anemia Panel: No results for input(s): VITAMINB12, FOLATE, FERRITIN, TIBC, IRON, RETICCTPCT in the last 72 hours. Urine analysis:    Component Value Date/Time   COLORURINE YELLOW 05/21/2019 1541   APPEARANCEUR  CLEAR 05/21/2019 1541    LABSPEC 1.012 05/21/2019 1541   PHURINE 5.0 05/21/2019 1541   GLUCOSEU NEGATIVE 05/21/2019 1541   HGBUR MODERATE (A) 05/21/2019 1541   BILIRUBINUR NEGATIVE 05/21/2019 1541   KETONESUR NEGATIVE 05/21/2019 1541   PROTEINUR NEGATIVE 05/21/2019 1541   UROBILINOGEN 0.2 04/27/2015 0834   NITRITE NEGATIVE 05/21/2019 1541   LEUKOCYTESUR NEGATIVE 05/21/2019 1541   Sepsis Labs: @LABRCNTIP (procalcitonin:4,lacticidven:4)  ) Recent Results (from the past 240 hour(s))  Culture, respiratory (non-expectorated)     Status: None   Collection Time: 05/22/19  5:00 AM   Specimen: Tracheal Aspirate; Respiratory  Result Value Ref Range Status   Specimen Description   Final    TRACHEAL ASPIRATE Performed at [facility] [address] [place], [place] [address]    Special Requests   Final    NONE Performed at [facility], [place] [address], [place], [place] [address]    Gram Stain   Final    MODERATE WBC PRESENT,BOTH PMN AND MONONUCLEAR ABUNDANT GRAM POSITIVE COCCI IN PAIRS IN CLUSTERS MODERATE GRAM VARIABLE ROD    Culture   Final    FEW Consistent with normal respiratory flora. Performed at [facility], [place] [address] [place], [place] [address]    Report Status 05/24/2019 FINAL  Final  Culture, Urine     Status: None   Collection Time: 05/22/19  5:00 AM   Specimen: Urine, Clean Catch  Result Value Ref Range Status   Specimen Description   Final    URINE, CLEAN CATCH Performed at [facility], [place] [address], [place], [place] [address]    Special Requests   Final    NONE Performed at [facility], [place] [address], [place], [place] [address]    Culture   Final    NO GROWTH Performed at [facility], [place] [address], [place], [place] [address]    Report Status 05/23/2019 FINAL  Final  Culture, blood (routine x 2)     Status: None (Preliminary result)   Collection Time: 05/28/19  4:30 PM    Specimen: BLOOD  Result Value Ref Range Status   Specimen Description   Final    BLOOD LEFT HAND Performed at [facility] [address], [place], [place] [address]    Special Requests   Final    BOTTLES DRAWN AEROBIC ONLY Blood Culture adequate volume Performed at [facility] [address], [place], [place] [address]    Culture   Final    NO GROWTH 2 DAYS Performed at [facility] [address] [place], [place] [address]    Report Status PENDING  Incomplete  Culture, blood (routine x 2)     Status: None (Preliminary result)   Collection Time: 05/28/19  4:36 PM   Specimen: BLOOD  Result Value Ref Range Status   Specimen Description   Final    BLOOD RIGHT HAND Performed at [facility] [address], [place], [place] [address]    Special Requests   Final    BOTTLES DRAWN AEROBIC ONLY Blood Culture adequate volume Performed at [place] [address], [place], [place] [address]    Culture   Final    NO GROWTH 2 DAYS Performed at [facility] [address] [place], [place] [address]    Report Status PENDING  Incomplete         Radiology  Studies: No results found.      Scheduled Meds: . chlorhexidine  15 mL Mouth Rinse BID  . Chlorhexidine Gluconate Cloth  6 each Topical Daily  . docusate  100 mg Oral BID  . enoxaparin (LOVENOX) injection  50 mg Subcutaneous Q24H  . feeding supplement (PRO-STAT SUGAR FREE 64)  30 mL Per Tube Daily  . free water  300 mL Per Tube Q6H  . insulin aspart  0-20 Units Subcutaneous Q4H  . insulin detemir  10 Units Subcutaneous Daily  . mouth rinse  15 mL Mouth Rinse q12n4p  . metoprolol tartrate  25 mg Per Tube BID  . pantoprazole (PROTONIX) IV  40 mg Intravenous Q24H  . sodium chloride flush  3 mL Intravenous Q12H   Continuous Infusions: . sodium chloride Stopped (05/27/19 0654)  . dextrose Stopped (05/27/19 1546)  . feeding  supplement (OSMOLITE 1.2 CAL) 55 mL/hr at 05/31/19 0700     LOS: 17 days   The patient is critically ill with multiple organ systems failure and requires high complexity decision making for assessment and support, frequent evaluation and titration of therapies, application of advanced monitoring technologies and extensive interpretation of multiple databases. Critical Care Time devoted to patient care services described in this note  Time spent: 40 minutes     WOODS, Geraldo Docker, MD [facility] Pager [phone]  If 7PM-7AM, please contact night-coverage [email or web address] Password [facility] 05/31/2019, 7:34 AM

## 2019-05-31 NOTE — Plan of Care (Signed)
  Problem: Clinical Measurements: Goal: Ability to maintain clinical measurements within normal limits will improve Outcome: Progressing Goal: Will remain free from infection Outcome: Progressing Goal: Diagnostic test results will improve Outcome: Progressing Goal: Respiratory complications will improve Outcome: Progressing Goal: Cardiovascular complication will be avoided Outcome: Progressing   Problem: Nutrition: Goal: Adequate nutrition will be maintained Outcome: Progressing   Problem: Coping: Goal: Level of anxiety will decrease Outcome: Progressing   Problem: Elimination: Goal: Will not experience complications related to bowel motility Outcome: Progressing Goal: Will not experience complications related to urinary retention Outcome: Progressing   Problem: Education: Goal: Knowledge of General Education information will improve Description: Including pain rating scale, medication(s)/side effects and non-pharmacologic comfort measures Outcome: Not Progressing   Problem: Health Behavior/Discharge Planning: Goal: Ability to manage health-related needs will improve Outcome: Not Progressing   Problem: Activity: Goal: Risk for activity intolerance will decrease Outcome: Not Progressing

## 2019-06-01 ENCOUNTER — Encounter: Payer: Medicare Other | Admitting: *Deleted

## 2019-06-01 DIAGNOSIS — F0151 Vascular dementia with behavioral disturbance: Secondary | ICD-10-CM

## 2019-06-01 LAB — COMPREHENSIVE METABOLIC PANEL
ALT: 26 U/L (ref 0–44)
AST: 28 U/L (ref 15–41)
Albumin: 3 g/dL — ABNORMAL LOW (ref 3.5–5.0)
Alkaline Phosphatase: 67 U/L (ref 38–126)
Anion gap: 15 (ref 5–15)
BUN: 59 mg/dL — ABNORMAL HIGH (ref 8–23)
CO2: 21 mmol/L — ABNORMAL LOW (ref 22–32)
Calcium: 9.2 mg/dL (ref 8.9–10.3)
Chloride: 109 mmol/L (ref 98–111)
Creatinine, Ser: 2.04 mg/dL — ABNORMAL HIGH (ref 0.44–1.00)
GFR calc Af Amer: 27 mL/min — ABNORMAL LOW (ref >60)
GFR calc non Af Amer: 23 mL/min — ABNORMAL LOW (ref >60)
Glucose, Bld: 96 mg/dL (ref 70–99)
Potassium: 4.7 mmol/L (ref 3.5–5.1)
Sodium: 145 mmol/L (ref 135–145)
Total Bilirubin: 0.4 mg/dL (ref 0.3–1.2)
Total Protein: 6.2 g/dL — ABNORMAL LOW (ref 6.5–8.1)

## 2019-06-01 LAB — D-DIMER, QUANTITATIVE: D-Dimer, Quant: 1.48 ug/mL-FEU — ABNORMAL HIGH (ref 0.00–0.50)

## 2019-06-01 LAB — GLUCOSE, CAPILLARY
Glucose-Capillary: 111 mg/dL — ABNORMAL HIGH (ref 70–99)
Glucose-Capillary: 115 mg/dL — ABNORMAL HIGH (ref 70–99)
Glucose-Capillary: 137 mg/dL — ABNORMAL HIGH (ref 70–99)
Glucose-Capillary: 140 mg/dL — ABNORMAL HIGH (ref 70–99)
Glucose-Capillary: 142 mg/dL — ABNORMAL HIGH (ref 70–99)
Glucose-Capillary: 151 mg/dL — ABNORMAL HIGH (ref 70–99)
Glucose-Capillary: 153 mg/dL — ABNORMAL HIGH (ref 70–99)
Glucose-Capillary: 153 mg/dL — ABNORMAL HIGH (ref 70–99)
Glucose-Capillary: 64 mg/dL — ABNORMAL LOW (ref 70–99)

## 2019-06-01 LAB — CBC WITH DIFFERENTIAL/PLATELET
Abs Immature Granulocytes: 0.05 10*3/uL (ref 0.00–0.07)
Basophils Absolute: 0 10*3/uL (ref 0.0–0.1)
Basophils Relative: 0 %
Eosinophils Absolute: 0.2 10*3/uL (ref 0.0–0.5)
Eosinophils Relative: 1 %
HCT: 37.7 % (ref 36.0–46.0)
Hemoglobin: 12.3 g/dL (ref 12.0–15.0)
Immature Granulocytes: 0 %
Lymphocytes Relative: 12 %
Lymphs Abs: 1.6 10*3/uL (ref 0.7–4.0)
MCH: 31.4 pg (ref 26.0–34.0)
MCHC: 32.6 g/dL (ref 30.0–36.0)
MCV: 96.2 fL (ref 80.0–100.0)
Monocytes Absolute: 0.6 10*3/uL (ref 0.1–1.0)
Monocytes Relative: 5 %
Neutro Abs: 10.4 10*3/uL — ABNORMAL HIGH (ref 1.7–7.7)
Neutrophils Relative %: 82 %
Platelets: 227 10*3/uL (ref 150–400)
RBC: 3.92 MIL/uL (ref 3.87–5.11)
RDW: 17.2 % — ABNORMAL HIGH (ref 11.5–15.5)
WBC: 12.8 10*3/uL — ABNORMAL HIGH (ref 4.0–10.5)
nRBC: 0 % (ref 0.0–0.2)

## 2019-06-01 LAB — C-REACTIVE PROTEIN: CRP: <0.8 mg/dL (ref <1.0)

## 2019-06-01 LAB — HEPARIN ANTI-XA: Heparin LMW: 0.91 IU/mL

## 2019-06-01 LAB — OCCULT BLOOD X 1 CARD TO LAB, STOOL: Fecal Occult Bld: NEGATIVE

## 2019-06-01 LAB — PHOSPHORUS: Phosphorus: 5.2 mg/dL — ABNORMAL HIGH (ref 2.5–4.6)

## 2019-06-01 LAB — URIC ACID: Uric Acid, Serum: 6.1 mg/dL (ref 2.5–7.1)

## 2019-06-01 LAB — MAGNESIUM: Magnesium: 2.2 mg/dL (ref 1.7–2.4)

## 2019-06-01 MED ORDER — RESOURCE THICKENUP CLEAR PO POWD
ORAL | Status: DC | PRN
  Filled 2019-06-01: qty 125

## 2019-06-01 MED ORDER — ACETAMINOPHEN 160 MG/5ML PO SOLN
325.0000 mg | Freq: Four times a day (QID) | ORAL | Status: DC | PRN
  Administered 2019-06-01 – 2019-06-03 (×4): 325 mg via ORAL
  Filled 2019-06-01 (×4): qty 20.3

## 2019-06-01 NOTE — Progress Notes (Signed)
  Speech Language Pathology Treatment: Dysphagia  Patient Details Name: Betty [name] MRN: [id number] DOB: 1946/03/08 Today's Date: 06/01/2019 Time: 7543-6067 SLP Time Calculation (min) (ACUTE ONLY): 12 min  Assessment / Plan / Recommendation Clinical Impression  Pt much more responsive today. At times, with increased effort and breath support pt had clearer vocal quality, but typically spoke with breathy vocal quality. She did express increased interest in PO and self fed sips of water from a straw. Throat clearing with thin liquids concerning for possible penetration/aspiration, pt is still at risk given prolonged intubation as well as presence of NG tube. She tolerated 4 oz of nectar thick water and 2 oz of puree without any signs of decreased airway protection. She can also masticate small amounts of regular solids. Will initiate a dys 1 (finely chopped) diet with nectar thick liquids. Will follow for tolerance. Can consider d/c of NG if pt tolerates intake throughout the day.   HPI HPI: Betty [name] is a 73 y.o. female admitted 7/9 with medical history significant of tobacco dependence; COPD, CAD; non-small cell lung CA (2016); HTN; HLD: dementia; COPD; stage IV CKD; and chronic combined CHF presenting with AMS. Intubated 7/10-7/13 and reintubated 7/13-7/21.      SLP Plan  Continue with current plan of care       Recommendations  Diet recommendations: Dysphagia 2 (fine chop);Nectar-thick liquid Liquids provided via: Straw;Cup Medication Administration: Via alternative means Supervision: Full supervision/cueing for compensatory strategies Compensations: Slow rate;Small sips/bites Postural Changes and/or Swallow Maneuvers: Seated upright 90 degrees                Oral Care Recommendations: Oral care BID Follow up Recommendations: Skilled Nursing facility SLP Visit Diagnosis: Dysphagia, unspecified (R13.10) Plan: Continue with current plan of care       GO                 Betty [name], Katherene Ponto 06/01/2019, 1:44 PM

## 2019-06-01 NOTE — Progress Notes (Signed)
ANTICOAGULATION CONSULT NOTE - Follow Up Consult  Pharmacy Consult for Lovenox Indication: atrial fibrillation  Allergies  Allergen Reactions  . Sulfa Antibiotics Itching    Patient Measurements: Height: 5\' 4"  (162.6 cm) Weight: 128 lb 1.4 oz (58.1 kg) IBW/kg (Calculated) : 54.7  Vital Signs: Temp: 98.3 F (36.8 C) (07/27 1626) Temp Source: Axillary (07/27 1626) BP: 132/70 (07/27 1626) Pulse Rate: 83 (07/27 1626)  Labs: Recent Labs    05/31/19 0404 05/31/19 0815 06/01/19 0208  HGB 12.3 12.2 12.3  HCT 39.0 37.2 37.7  PLT 215 218 227  CREATININE 1.90* 1.92* 2.04*    Estimated Creatinine Clearance: 20.9 mL/min (A) (by C-G formula based on SCr of 2.04 mg/dL (H)).   Assessment: 73 y.o. female admitted on 05/13/2019 with COVID-19 pneumonia, requiring intubation.  Patient developed atrial fibrillation (CHA2DS2/VAS= 5), started on amiodarone which was stopped, was being AV paced. Has been on VTE prophylaxis with SQ heparin, then developed GI bleed on 7/17 with Hgb decreased 7.5 for which she was transfused.  No evidencey of further bleeding and Hgb better so full-dose Lovenox started for afib on 7/19, dose was held on 7/23, but resumed on 7/24.  Today, 06/01/2019: SCr increased to 2, CrCl ~ 20 ml/min CBC: HGb has remained very stable at 12.3, Plt WNL. No bleeding or complications reported. FOB negative on 05/31/19. Anti-Xa level pending.  (previous level above goal at 1.38 and dose was decreased on   Goal of Therapy:  Anti-Xa level 0.6-1 units/ml (4 hours post dose) Monitor platelets by anticoagulation protocol: Yes   Plan:  - Lovenox to 50 mg daily  - Repeat level as renal function improves/stabilizes - CBC at least q 72h.  Monitor for bleeding  - f/u long-term anticoagulation plans.  Gretta Arab PharmD, BCPS Clinical pharmacist phone 7am- 5pm: [phone] 06/01/2019 4:37 PM

## 2019-06-01 NOTE — Progress Notes (Signed)
Physical Therapy Treatment Patient Details Name: Betty [name] MRN: [id number] DOB: 1946/07/18 Today's Date: 06/01/2019    History of Present Illness Pt is a 73 y.o. female admitted 05/13/19 with AMS and weakness; diagnosed with COVID-19 oin 7/9. ETT 7/10-7/13, reintubated 7/13- 7/21; transfer out of ICU 7/22. Returned to ICU 7/23 with worsening encephalopathy. Plan to transfer to floor 7/24. PMH includes CAD, right ruptured cerebral aneurysm,non-small cell lung CA, HTN, COPD, CKD IV, CHF, dementia., PPM, R L L lobectomy., NSTEMI, afib    PT Comments     patient is very weak, moaning  But does not indicate discomfort . Patient did support self on bed edge for short periods. Patient's legs are very weak so used maximove to recliner/ Patient reports feeling very tired.  Continue PT efforts with mobility.  Follow Up Recommendations  SNF;Supervision/Assistance - 24 hour     Equipment Recommendations    none   Recommendations for Other Services       Precautions / Restrictions Precautions Precautions: Fall Precaution Comments: NGT, flexiseal, purewick    Mobility  Bed Mobility Overal bed mobility: Needs Assistance Bed Mobility: Rolling;Sidelying to Sit Rolling: Total assist Sidelying to sit: +2 for physical assistance;Total assist Supine to sit: Max assist;+2 for physical assistance     General bed mobility comments: Pt with minimal movement initiation; maxA+2 to elevate trunk and scoot hips to EOB with bed pad  Transfers Overall transfer level: Needs assistance               General transfer comment: Maximove used from sittign EOB to mobilize to chair  Ambulation/Gait                 Stairs             Wheelchair Mobility    Modified Rankin (Stroke Patients Only)       Balance Overall balance assessment: Needs assistance Sitting-balance support: Bilateral upper extremity supported Sitting balance-Leahy Scale: Poor Sitting balance - Comments: able  to sit unsuported for 1 min; max A at times; will sit unsupported, then fall posteriorly       Standing balance comment: nt                            Cognition Arousal/Alertness: Awake/alert Behavior During Therapy: Restless;Flat affect Overall Cognitive Status: Impaired/Different from baseline Area of Impairment: Orientation;Attention;Memory;Following commands;Safety/judgement;Awareness;Problem solving                 Orientation Level: Disoriented to;Time;Situation(oriented to hospital) Current Attention Level: Alternating Memory: Decreased short-term memory Following Commands: Follows one step commands inconsistently Safety/Judgement: Decreased awareness of safety;Decreased awareness of deficits Awareness: Intellectual Problem Solving: Slow processing;Decreased initiation;Difficulty sequencing;Requires verbal cues;Requires tactile cues General Comments: H/o dementia. Pt able to state first name is Betty [name], thinks she is at home. Max cues to answer questions, but pt providing minimal responses, at times mumbling difficult to understand. Clearly states, "I'm cold" a couple times. when asking orientation questions pt stating she is at home      Exercises      General Comments        Pertinent Vitals/Pain Pain Assessment: Faces Faces Pain Scale: Hurts even more Pain Location: abdomen; nose,kleeps reaching for NG feeding tube Pain Descriptors / Indicators: Discomfort;Grimacing;Guarding;Moaning Pain Intervention(s): Limited activity within patient's tolerance;Monitored during session;Repositioned    Home Living  Prior Function            PT Goals (current goals can now be found in the care plan section) Acute Rehab PT Goals Patient Stated Goal: none stated Progress towards PT goals: Progressing toward goals    Frequency    Min 2X/week      PT Plan Current plan remains appropriate    Co-evaluation PT/OT/SLP  Co-Evaluation/Treatment: Yes Reason for Co-Treatment: For patient/therapist safety PT goals addressed during session: Mobility/safety with mobility OT goals addressed during session: ADL's and self-care      AM-PAC PT "6 Clicks" Mobility   Outcome Measure  Help needed turning from your back to your side while in a flat bed without using bedrails?: Total Help needed moving from lying on your back to sitting on the side of a flat bed without using bedrails?: Total Help needed moving to and from a bed to a chair (including a wheelchair)?: Total Help needed standing up from a chair using your arms (e.g., wheelchair or bedside chair)?: Total Help needed to walk in hospital room?: Total Help needed climbing 3-5 steps with a railing? : Total 6 Click Score: 6    End of Session Equipment Utilized During Treatment: Oxygen Activity Tolerance: Patient limited by fatigue Patient left: in chair;with call bell/phone within reach;with nursing/sitter in room Nurse Communication: Mobility status;Need for lift equipment PT Visit Diagnosis: Other abnormalities of gait and mobility (R26.89);Muscle weakness (generalized) (M62.81)     Time: 1004-1040 PT Time Calculation (min) (ACUTE ONLY): 36 min  Charges:  $Therapeutic Activity: 8-22 mins                     Tresa Endo PT Acute Rehabilitation Services   Office [phone]    [name] 06/01/2019, 1:31 PM

## 2019-06-01 NOTE — Progress Notes (Signed)
ANTICOAGULATION CONSULT NOTE - Follow Up Consult  Pharmacy Consult for Lovenox Indication: atrial fibrillation  Allergies  Allergen Reactions  . Sulfa Antibiotics Itching    Patient Measurements: Height: 5\' 4"  (162.6 cm) Weight: 128 lb 1.4 oz (58.1 kg) IBW/kg (Calculated) : 54.7  Vital Signs: Temp: 98.3 F (36.8 C) (07/27 1626) Temp Source: Axillary (07/27 1626) BP: 132/70 (07/27 1626) Pulse Rate: 83 (07/27 1626)  Labs: Recent Labs    05/31/19 0404 05/31/19 0815 06/01/19 0208 06/01/19 1715  HGB 12.3 12.2 12.3  --   HCT 39.0 37.2 37.7  --   PLT 215 218 227  --   HEPRLOWMOCWT  --   --   --  0.91  CREATININE 1.90* 1.92* 2.04*  --     Estimated Creatinine Clearance: 20.9 mL/min (A) (by C-G formula based on SCr of 2.04 mg/dL (H)).   Assessment: 73 y.o. female admitted on 05/13/2019 with COVID-19 pneumonia, requiring intubation.  Patient developed atrial fibrillation (CHA2DS2/VAS= 5), started on amiodarone which was stopped, was being AV paced. Has been on VTE prophylaxis with SQ heparin, then developed GI bleed on 7/17 with Hgb decreased 7.5 for which she was transfused.  No evidencey of further bleeding and Hgb better so full-dose Lovenox started for afib on 7/19, dose was held on 7/23, but resumed on 7/24.  Today, 06/01/2019: SCr increased to 2, CrCl ~ 20 ml/min CBC: HGb has remained very stable at 12.3, Plt WNL. No bleeding or complications reported. FOB negative on 05/31/19. Anti-Xa level pending.  (previous level above goal at 1.38 and dose was decreased on   6:22 PM: Anti-Xa level = 0.91 drawn 3hr and 21min after today's dose which is ~45 minutes early.  Therefore, true level is likely slightly higher but probably not > 1.0 units/ml.  Goal of Therapy:  Anti-Xa level 0.6-1 units/ml (4 hours post dose) Monitor platelets by anticoagulation protocol: Yes   Plan:  - Continue Lovenox to 50 mg daily  - Repeat level as renal function improves/stabilizes - CBC at least q  72h.  Monitor for bleeding  - f/u long-term anticoagulation plans.  Peggyann Juba, PharmD, BCPS Clinical pharmacist phone 7am- 5pm: [phone] 06/01/2019 6:02 PM

## 2019-06-01 NOTE — Progress Notes (Signed)
Daily Nursing Note  Received report on patient from charge RN. Patient in NAD getting NGT feedings at the time of assessment. Nonverbal signs of pain, tylenol given seemed to be central to abdomen. Bladder scan done >429ml of retention straight cath completed with 533ml urine OP. Abd softer thereafter patient appeared relieved.  Remains to have loose stool, about 546ml present in Flexi-seal though it was unclear what timeframe that accumulated with in. A fresh flexi-bag was placed. Gotten OOB by PT, tolerated for about an hour and a half. Later on was able to sit patient at the edge of the bed. Patient has been incrementally alert today, we discussed her love of foods most especially sweet ones. Notes to have a BS of 64 in evening which corrected after applesauce with sugar was given to patient. All patient needs me throughout the day.

## 2019-06-01 NOTE — Progress Notes (Signed)
PROGRESS NOTE    Betty [name]  XIH:[id number] DOB: May 08, 1946 DOA: 05/13/2019 PCP: Josetta Huddle, MD   Brief Narrative:  73 year old BF PMHx dementia, CAD, NSCLC in 2016, HTN, HLD COPD, kidney stage IV, hronic kidney disease stage IV, chronic systolic and diastolic CHF   brought to the hospital by the patient's son due to increased confusion and weakness. She was diagnosed with coronavirus in the ED on 7/9. She was also found to be hypoxic requiring 2 L nasal cannula. She was admitted to the hospital and placed on steroids along with Remdesivir, deferred to ICU on 7/10, where she required intubation, she was extubated on 7/13, where she was reintubated that same day due to encephalopathy, was extubated 7/21   Subjective: 7/27 A/O x1 (does not know when, where, why).  Follows commands much more alert today.  Would like to get out of bed to chair.    Assessment & Plan:   Principal Problem:   COVID-19 Active Problems:   Non-small cell carcinoma of right lung, stage 1 (HCC)   CKD (chronic kidney disease), stage IV (HCC)   Chronic systolic heart failure (HCC)   Dementia (HCC)   Essential hypertension   Acute respiratory failure with hypoxemia (HCC)   Pressure injury of skin   Altered mental status   Acute respiratory failure with hypoxia/COVID 19 pneumonia Recent Labs  Lab 05/31/19 0739 06/01/19 0208  CRP <0.8 <0.8   Recent Labs  Lab 05/26/19 0400 05/31/19 0404 06/01/19 0208  DDIMER 3.13* 1.48* 1.48*  -Completed course of antibiotics, Remdesavir+ Steroids --Actemra x1 administered 7/10.   - Intubated x2 while hospitalized. -Titrate O2 to maintain SPO2 89 to 93% -DuoNeb every 6 hours  Septic shock  -Venous blood gas showed saturation of 75 suggesting more of a sepsis etiology rather than cardiogenic. - Resolved  Chronic systolic and diastolic CHF -Strict in and out +11.8 L -Daily weight Filed Weights   05/30/19 0453 05/31/19 0449 06/01/19 0600  Weight: 47.1 kg  57.4 kg 58.1 kg  - June 2020 echocardiogram EF 30 to 35%.    A. fib with RVR -Labetalol PRN - On metoprolol prior to admission.  Continue metoprolol 25 mg twice daily - Briefly on amiodarone drip. - Currently rate controlled paced rhythm - On Lovenox  Lower GI bleed - During his hospitalization patient noted to have bloody stools, gastric lavage did not show any blood in the stomach. - Transfuse 1 unit PRBC - Currently blood noticed around Flexi-Seal and watery fluid within her gastric bag possibly positive for blood. - Occult blood pending  Acute blood loss anemia secondary to GI bleed  -GI bleed may be reoccurring.  See lower GI bleed Recent Labs  Lab 05/28/19 1300 05/30/19 0538 05/31/19 0404 05/31/19 0815 06/01/19 0208  HGB 13.1 12.2 12.3 12.2 12.3  -Currently stable  Hypernatremia Recent Labs  Lab 05/29/19 0950 05/30/19 0538 05/31/19 0404 05/31/19 0815 06/01/19 0208  NA 151* 146* 145 145 145  -Continue free water 300 ml every 6 hours   Hypokalemia -Potassium goal> 4  Acute on CKD stage IV (baseline Cr 2- 3) Recent Labs  Lab 05/26/19 0400 05/27/19 0445 05/28/19 0300 05/28/19 1300 05/29/19 0950 05/30/19 0538 05/31/19 0404 05/31/19 0815 06/01/19 0208  CREATININE 2.57* 1.98* 1.86* 1.98* 1.80* 1.84* 1.90* 1.92* 2.04*  - Better than baseline  Prediabetes vs hyperglycemia - 7/1 hemoglobin A1c= 6.9 -Prior EMR has had multiple episodes of steroids in the past 1 to 2 months for acute gout as well  as COVID.  Patient now off steroids - Levemir 10 units daily - Resistant SSI  Dementia/acute metabolic encephalopathy - Discussed case with PCCM staff patient's baseline is; able to care for herself independently, yesterday post extubation was able to carry on a conversation respond appropriately to commands. - Today's cognitive evaluation showed a marked difference, patient was lethargic open her eyes when her name was called but did not follow commands.   Withdrew to painful stimuli. -Patient still very confused though awake dementia vs hospital delirium  Hx NSCLC/COPD -S/p RLL lobectomy + 4 cycles of adjuvant chemotherapy -Lung cancer appears to be in remission per EMR   Hx gout  -Per EMR last 1 to 81-month treated for acute gout with steroids.  Does not take steroids on a regular basis -7/27 uric acid= 6.1  Nutrition -Continue tube feeds until cleared by speech therapy. -7/27 requested speech reevaluate patient's dysphagia.    Goals of care - 7/27 next 48 hours return to SNF.   - 7/27 social work consult placed return patient to SNF    DVT prophylaxis: Lovenox Code Status: DNR Family Communication: None.   Disposition Plan: TBD   Consultants:  PCCM    Procedures/Significant Events:  6/17 echocardiogram:Left Ventricle: The left ventricle has moderate-severely reduced systolic function. -EF 30-35%.  -Left ventricular diffuse hypokinesis.    I have personally reviewed and interpreted all radiology studies and my findings are as above.  VENTILATOR SETTINGS:    Cultures   Antimicrobials: Anti-infectives (From admission, onward)   Start     Stop   05/22/19 1130  vancomycin (VANCOCIN) 1,250 mg in sodium chloride 0.9 % 250 mL IVPB     05/22/19 1302   05/22/19 1057  vancomycin variable dose per unstable renal function (pharmacist dosing)  Status:  Discontinued     05/24/19 1406   05/19/19 1030  piperacillin-tazobactam (ZOSYN) IVPB 2.25 g     05/25/19 2359   05/15/19 1230  remdesivir 100 mg in sodium chloride 0.9 % 250 mL IVPB     05/18/19 1340   05/14/19 1230  remdesivir 200 mg in sodium chloride 0.9 % 250 mL IVPB     05/14/19 1551      Devices    LINES / TUBES:      Continuous Infusions: . sodium chloride Stopped (05/27/19 0654)  . dextrose Stopped (05/27/19 1546)  . feeding supplement (OSMOLITE 1.2 CAL) 1,000 mL (05/31/19 1848)     Objective: Vitals:   06/01/19 0400 06/01/19 0431 06/01/19  0600 06/01/19 0805  BP:  139/69  133/67  Pulse:  86  88  Resp:  16  16  Temp: 97.8 F (36.6 C) 97.8 F (36.6 C)  97.6 F (36.4 C)  TempSrc: Axillary Axillary  Axillary  SpO2:  90%  90%  Weight:   58.1 kg   Height:        Intake/Output Summary (Last 24 hours) at 06/01/2019 0838 Last data filed at 06/01/2019 0441 Gross per 24 hour  Intake 1074.33 ml  Output 800 ml  Net 274.33 ml   Filed Weights   05/30/19 0453 05/31/19 0449 06/01/19 0600  Weight: 47.1 kg 57.4 kg 58.1 kg   Physical Exam:  General: A/O x1 (does not know where, when, why) no acute respiratory distress, does not follow commands Eyes: negative scleral hemorrhage, negative anisocoria, negative icterus ENT: Negative Runny nose, negative gingival bleeding, Neck:  Negative scars, masses, torticollis, lymphadenopathy, JVD Lungs: Clear to auscultation bilaterally without wheezes or crackles Cardiovascular: Regular  rate and rhythm without murmur gallop or rub normal S1 and S2 Abdomen: negative abdominal pain, nondistended, positive soft, bowel sounds, no rebound, no ascites, no appreciable mass Extremities: No significant cyanosis, clubbing, or edema bilateral lower extremities Skin: Negative rashes, lesions, ulcers Psychiatric: Unable to evaluate secondary Patient's altered mental status Central nervous system:  Cranial nerves II through XII intact, tongue/uvula midline, all extremities muscle strength 5/5, sensation intact throughout, negative dysarthria, negative expressive aphasia, negative receptive aphasia.     Data Reviewed: Care during the described time interval was provided by me .  I have reviewed this patient's available data, including medical history, events of note, physical examination, and all test results as part of my evaluation.   CBC: Recent Labs  Lab 05/28/19 0300  05/28/19 1300 05/30/19 0538 05/31/19 0404 05/31/19 0815 06/01/19 0208  WBC 14.7*  --  16.5* 14.0* 13.5* 14.2* 12.8*  NEUTROABS  12.3*  --  14.9*  --   --  11.7* 10.4*  HGB 13.0   < > 13.1 12.2 12.3 12.2 12.3  HCT 40.3   < > 39.9 37.6 39.0 37.2 37.7  MCV 94.6  --  94.3 96.2 96.1 94.7 96.2  PLT 252  --  258 199 215 218 227   < > = values in this interval not displayed.   Basic Metabolic Panel: Recent Labs  Lab 05/27/19 1511 05/28/19 0300  05/28/19 1300 05/28/19 1630 05/29/19 0950 05/30/19 0538 05/31/19 0404 05/31/19 0815 06/01/19 0208  NA  --  149*   < > 151*  --  151* 146* 145 145 145  K  --  3.6   < > 5.1  --  4.5 4.8 4.4 4.9 4.7  CL  --  117*  --  123*  --  124* 117* 112* 111 109  CO2  --  19*  --  18*  --  19* 21* 21* 21* 21*  GLUCOSE  --  115*  --  144*  --  132* 120* 106* 130* 96  BUN  --  56*  --  63*  --  53* 51* 52* 51* 59*  CREATININE  --  1.86*  --  1.98*  --  1.80* 1.84* 1.90* 1.92* 2.04*  CALCIUM  --  9.7  --  9.9  --  9.7 9.3 9.4 9.3 9.2  MG 1.9 2.1  --  2.3 2.4 2.3  --  2.0  --  2.2  PHOS 3.9 4.0  --   --  3.8  --   --   --   --  5.2*   < > = values in this interval not displayed.   GFR: Estimated Creatinine Clearance: 20.9 mL/min (A) (by C-G formula based on SCr of 2.04 mg/dL (H)). Liver Function Tests: Recent Labs  Lab 05/27/19 0445 05/28/19 0300 05/28/19 1300 05/31/19 0815 06/01/19 0208  AST 42* 38 34 29 28  ALT 31 30 27 27 26   ALKPHOS 59 68 68 64 67  BILITOT 0.7 0.7 0.9 0.4 0.4  PROT 6.0* 6.5 6.8 6.0* 6.2*  ALBUMIN 2.9* 3.1* 3.2* 2.9* 3.0*   No results for input(s): LIPASE, AMYLASE in the last 168 hours. No results for input(s): AMMONIA in the last 168 hours. Coagulation Profile: Recent Labs  Lab 05/28/19 1300  INR 1.1   Cardiac Enzymes: No results for input(s): CKTOTAL, CKMB, CKMBINDEX, TROPONINI in the last 168 hours. BNP (last 3 results) No results for input(s): PROBNP in the last 8760 hours. HbA1C: No results for  input(s): HGBA1C in the last 72 hours. CBG: Recent Labs  Lab 05/31/19 0413 05/31/19 0736 05/31/19 1551 05/31/19 2358 06/01/19 0806  GLUCAP  109* 112* 138* 151* 140*   Lipid Profile: No results for input(s): CHOL, HDL, LDLCALC, TRIG, CHOLHDL, LDLDIRECT in the last 72 hours. Thyroid Function Tests: No results for input(s): TSH, T4TOTAL, FREET4, T3FREE, THYROIDAB in the last 72 hours. Anemia Panel: No results for input(s): VITAMINB12, FOLATE, FERRITIN, TIBC, IRON, RETICCTPCT in the last 72 hours. Urine analysis:    Component Value Date/Time   COLORURINE YELLOW 05/21/2019 1541   APPEARANCEUR CLEAR 05/21/2019 1541   LABSPEC 1.012 05/21/2019 1541   PHURINE 5.0 05/21/2019 1541   GLUCOSEU NEGATIVE 05/21/2019 1541   HGBUR MODERATE (A) 05/21/2019 1541   BILIRUBINUR NEGATIVE 05/21/2019 1541   KETONESUR NEGATIVE 05/21/2019 1541   PROTEINUR NEGATIVE 05/21/2019 1541   UROBILINOGEN 0.2 04/27/2015 0834   NITRITE NEGATIVE 05/21/2019 1541   LEUKOCYTESUR NEGATIVE 05/21/2019 1541   Sepsis Labs: @LABRCNTIP (procalcitonin:4,lacticidven:4)  ) Recent Results (from the past 240 hour(s))  Culture, blood (routine x 2)     Status: None (Preliminary result)   Collection Time: 05/28/19  4:30 PM   Specimen: BLOOD  Result Value Ref Range Status   Specimen Description   Final    BLOOD LEFT HAND Performed at [facility], [place] [address], [place], [place] [address]    Special Requests   Final    BOTTLES DRAWN AEROBIC ONLY Blood Culture adequate volume Performed at [facility] [address], [place], [place] [address]    Culture   Final    NO GROWTH 4 DAYS Performed at [facility], [place] [address] [place], [place] [address]    Report Status PENDING  Incomplete  Culture, blood (routine x 2)     Status: None (Preliminary result)   Collection Time: 05/28/19  4:36 PM   Specimen: BLOOD  Result Value Ref Range Status   Specimen Description   Final    BLOOD RIGHT HAND Performed at [facility] [address] [place], [place] [address]    Special Requests   Final    BOTTLES  DRAWN AEROBIC ONLY Blood Culture adequate volume Performed at [facility] [address] [place], [place] [address]    Culture   Final    NO GROWTH 4 DAYS Performed at [facility], [place] [address] [place], [place] [address]    Report Status PENDING  Incomplete  Culture, Urine     Status: None (Preliminary result)   Collection Time: 05/31/19  4:24 AM   Specimen: Urine, Random  Result Value Ref Range Status   Specimen Description   Final    URINE, RANDOM Performed at [facility] [address] [place], [place] [address]    Special Requests   Final    NONE Performed at [facility], [place] [address], [place], [place] [address]    Culture   Final    CULTURE REINCUBATED FOR BETTER GROWTH Performed at [facility], [place] [address], [place], [place] [address]    Report Status PENDING  Incomplete         Radiology Studies: No results found.      Scheduled Meds: . chlorhexidine  15 mL Mouth Rinse BID  . Chlorhexidine Gluconate Cloth  6 each Topical Daily  . docusate  100 mg Oral BID  . enoxaparin (LOVENOX) injection  50 mg Subcutaneous  Q24H  . feeding supplement (PRO-STAT SUGAR FREE 64)  30 mL Per Tube Daily  . free water  300 mL Per Tube Q6H  . insulin aspart  0-20 Units Subcutaneous Q4H  . insulin detemir  10 Units Subcutaneous Daily  . Ipratropium-Albuterol  1 puff Inhalation Q6H  . mouth rinse  15 mL Mouth Rinse q12n4p  . metoprolol tartrate  25 mg Per Tube BID  . pantoprazole (PROTONIX) IV  40 mg Intravenous Q24H  . sodium chloride flush  3 mL Intravenous Q12H   Continuous Infusions: . sodium chloride Stopped (05/27/19 0654)  . dextrose Stopped (05/27/19 1546)  . feeding supplement (OSMOLITE 1.2 CAL) 1,000 mL (05/31/19 1848)     LOS: 18 days   The patient is critically ill with multiple organ systems failure and requires high complexity decision making for assessment and support, frequent  evaluation and titration of therapies, application of advanced monitoring technologies and extensive interpretation of multiple databases. Critical Care Time devoted to patient care services described in this note  Time spent: 40 minutes     Bo Teicher, Geraldo Docker, MD [facility] Pager [phone]  If 7PM-7AM, please contact night-coverage [email or web address] Password [facility] 06/01/2019, 8:38 AM

## 2019-06-01 NOTE — Consult Note (Signed)
   Betty [name] CM Inpatient Consult   06/01/2019  Betty [name] 01-14-1946 [id number]    Follow-up note:   Patient reviewed forpossible needs of [facility] Care Management servicesfor long length of stay with 27% high risk score for unplanned readmission and hospitalization, and as a benefit under her Medicare/ [employer] plan.Patient/ son was previously outreached by [facility] Telephonic care Nurse, children's.   Chart review and MD note dated 06/01/19 reveals that: Patient was diagnosed with coronavirus in the ED on 7/9. She was also found to be hypoxic requiring 2 L nasal cannula. She was admitted to the hospital and placed on steroids along with Remdesivir, deferred to ICU on 7/10, where she required intubation, she was extubated on 7/13, where she was reintubated that same day due to encephalopathy, was extubated 7/21. Transferred to progressive care unit on 05/30/19.  Her primary care provider is Dr. Josetta Huddle with [facility], listed as providing transition of care follow-up.   Brief chart review for disposition and needs, shows that patient is currently being recommendedby PT/ OT for SNF (skilled nursing level- rehab therapies).  Plan: follow for progress with ongoing needs. If there are any changes, please place a [facility] consultfor follow-upas appropriate.  [facility] PAC (post acute coordinator)will be made awareof patient's disposition if discharge to facility covered by [facility]- CMfor appropriate follow-up.   For questionsand additional information, pleasecontact:  Brent Taillon A. Yanely Mast, BSN, RN-BC [facility] Liaison Cell: [phone]

## 2019-06-01 NOTE — Plan of Care (Signed)
  Problem: Clinical Measurements: Goal: Ability to maintain clinical measurements within normal limits will improve Outcome: Progressing Goal: Will remain free from infection Outcome: Progressing Goal: Diagnostic test results will improve Outcome: Progressing Goal: Respiratory complications will improve Outcome: Progressing Goal: Cardiovascular complication will be avoided Outcome: Progressing   Problem: Activity: Goal: Risk for activity intolerance will decrease Outcome: Progressing   Problem: Nutrition: Goal: Adequate nutrition will be maintained Outcome: Progressing   Problem: Coping: Goal: Level of anxiety will decrease Outcome: Progressing   Problem: Education: Goal: Knowledge of General Education information will improve Description: Including pain rating scale, medication(s)/side effects and non-pharmacologic comfort measures Outcome: Not Progressing   Problem: Health Behavior/Discharge Planning: Goal: Ability to manage health-related needs will improve Outcome: Not Progressing

## 2019-06-01 NOTE — Progress Notes (Signed)
Occupational Therapy Treatment Patient Details Name: Betty [name] MRN: [id number] DOB: 09/30/1946 Today's Date: 06/01/2019    History of present illness Pt is a 73 y.o. female admitted 05/13/19 with AMS and weakness; diagnosed with COVID-19 oin 7/9. ETT 7/10-7/13, reintubated 7/13- 7/21; transfer out of ICU 7/22. Returned to ICU 7/23 with worsening encephalopathy. Plan to transfer to floor 7/24. PMH includes CAD, right ruptured cerebral aneurysm,non-small cell lung CA, HTN, COPD, CKD IV, CHF, dementia., PPM, R L L lobectomy., NSTEMI, afib   OT comments  Pt more alert than previous session and able to sustain attention to task for brief periods. Pt appears distracted duirng session by abdominal pain. Able to sit unsupported EOB for @ 1 min before fatiguing and leaning posteriorly. Intermittently following commands. Total A at this time for ADL. SpO2 85-97, therefore placed on 3L O2 [place]. HR 87; RR 27-33; BP 139/73 (EOB); 122/89 (chair). Nsg aware ot pt being restless and complaining of abdominal pain. Secure chatted ST to see if they are following up with pt today.   Follow Up Recommendations  SNF;Supervision/Assistance - 24 hour    Equipment Recommendations  Other (comment)    Recommendations for Other Services      Precautions / Restrictions Precautions Precautions: Fall Precaution Comments: NGT, flexiseal, purewick Restrictions Weight Bearing Restrictions: No       Mobility Bed Mobility Overal bed mobility: Needs Assistance Bed Mobility: Rolling;Sidelying to Sit Rolling: Total assist Sidelying to sit: +2 for physical assistance;Total assist          Transfers                 General transfer comment: Maximove used from sittign EOB to mobilize to chair    Balance Overall balance assessment: Needs assistance   Sitting balance-Leahy Scale: Poor Sitting balance - Comments: able to sit unsuported for 1 min; max A at times; will sit unsupported, then fall posteriorly                                    ADL either performed or assessed with clinical judgement   ADL Overall ADL's : Needs assistance/impaired Eating/Feeding: NPO                                   Functional mobility during ADLs: Total assistance;+2 for physical assistance(able to sit unsupported EOB for short periods) General ADL Comments: total A     Vision       Perception     Praxis      Cognition Arousal/Alertness: Lethargic Behavior During Therapy: Restless;Flat affect Overall Cognitive Status: Impaired/Different from baseline Area of Impairment: Orientation;Attention;Memory;Following commands;Safety/judgement;Awareness;Problem solving                 Orientation Level: Disoriented to;Place;Time;Situation Current Attention Level: Focused Memory: Decreased short-term memory Following Commands: Follows one step commands inconsistently Safety/Judgement: Decreased awareness of safety;Decreased awareness of deficits Awareness: Intellectual Problem Solving: Slow processing;Decreased initiation;Difficulty sequencing;Requires verbal cues;Requires tactile cues          Exercises     Shoulder Instructions       General Comments      Pertinent Vitals/ Pain       Pain Assessment: Faces Faces Pain Scale: Hurts even more Pain Location: abdomen; nose Pain Descriptors / Indicators: Discomfort;Grimacing;Guarding;Moaning Pain Intervention(s): Limited activity within patient's tolerance;Repositioned;Other (comment)(nsg aware)  Home Living                                          Prior Functioning/Environment              Frequency  Min 2X/week        Progress Toward Goals  OT Goals(current goals can now be found in the care plan section)  Progress towards OT goals: Progressing toward goals  Acute Rehab OT Goals Patient Stated Goal: none stated OT Goal Formulation: Patient unable to participate in goal  setting Time For Goal Achievement: 06/12/19 Potential to Achieve Goals: Fair ADL Goals Pt Will Perform Grooming: with min guard assist;sitting Pt Will Perform Upper Body Bathing: with min assist;sitting Pt Will Perform Upper Body Dressing: with min assist;sitting Pt Will Transfer to Toilet: with mod assist;stand pivot transfer;bedside commode Pt Will Perform Toileting - Clothing Manipulation and hygiene: with mod assist;sit to/from stand Pt/caregiver will Perform Home Exercise Program: Increased ROM;Increased strength;Both right and left upper extremity;With minimal assist;With written HEP provided Additional ADL Goal #1: Pt will follow 1 step commands with 75% accuracy during functional tasks. Additional ADL Goal #2: Pt will sustain attention to functional task >5 min without cues.  Plan Discharge plan remains appropriate    Co-evaluation    PT/OT/SLP Co-Evaluation/Treatment: Yes Reason for Co-Treatment: Complexity of the patient's impairments (multi-system involvement);For patient/therapist safety;To address functional/ADL transfers   OT goals addressed during session: ADL's and self-care;Strengthening/ROM      AM-PAC OT "6 Clicks" Daily Activity     Outcome Measure   Help from another person eating meals?: Total Help from another person taking care of personal grooming?: Total Help from another person toileting, which includes using toliet, bedpan, or urinal?: Total Help from another person bathing (including washing, rinsing, drying)?: Total Help from another person to put on and taking off regular upper body clothing?: Total Help from another person to put on and taking off regular lower body clothing?: Total 6 Click Score: 6    End of Session Equipment Utilized During Treatment: Oxygen(3L)  OT Visit Diagnosis: Muscle weakness (generalized) (M62.81);Other symptoms and signs involving cognitive function;Unsteadiness on feet (R26.81)   Activity Tolerance Patient limited by  fatigue   Patient Left in chair;with call bell/phone within reach;with chair alarm set   Nurse Communication Mobility status;Need for lift equipment        Time: 1003-1043 OT Time Calculation (min): 40 min  Charges: OT General Charges $OT Visit: 1 Visit OT Treatments $Self Care/Home Management : 23-37 mins  Maurie Boettcher, OT/L   Acute OT Clinical Specialist [place] Pager [phone] Office [phone]    [facility] 06/01/2019, 11:01 AM

## 2019-06-02 DIAGNOSIS — N390 Urinary tract infection, site not specified: Secondary | ICD-10-CM

## 2019-06-02 LAB — COMPREHENSIVE METABOLIC PANEL
ALT: 26 U/L (ref 0–44)
ALT: 26 U/L (ref 0–44)
AST: 36 U/L (ref 15–41)
AST: 46 U/L — ABNORMAL HIGH (ref 15–41)
Albumin: 3 g/dL — ABNORMAL LOW (ref 3.5–5.0)
Albumin: 3.2 g/dL — ABNORMAL LOW (ref 3.5–5.0)
Alkaline Phosphatase: 65 U/L (ref 38–126)
Alkaline Phosphatase: 68 U/L (ref 38–126)
Anion gap: 12 (ref 5–15)
Anion gap: 10 (ref 5–15)
BUN: 58 mg/dL — ABNORMAL HIGH (ref 8–23)
BUN: 62 mg/dL — ABNORMAL HIGH (ref 8–23)
CO2: 22 mmol/L (ref 22–32)
CO2: 25 mmol/L (ref 22–32)
Calcium: 9.2 mg/dL (ref 8.9–10.3)
Calcium: 9.4 mg/dL (ref 8.9–10.3)
Chloride: 107 mmol/L (ref 98–111)
Chloride: 106 mmol/L (ref 98–111)
Creatinine, Ser: 2.26 mg/dL — ABNORMAL HIGH (ref 0.44–1.00)
Creatinine, Ser: 2.36 mg/dL — ABNORMAL HIGH (ref 0.44–1.00)
GFR calc Af Amer: 24 mL/min — ABNORMAL LOW (ref >60)
GFR calc Af Amer: 23 mL/min — ABNORMAL LOW (ref >60)
GFR calc non Af Amer: 21 mL/min — ABNORMAL LOW (ref >60)
GFR calc non Af Amer: 20 mL/min — ABNORMAL LOW (ref >60)
Glucose, Bld: 120 mg/dL — ABNORMAL HIGH (ref 70–99)
Glucose, Bld: 142 mg/dL — ABNORMAL HIGH (ref 70–99)
Potassium: 5.8 mmol/L — ABNORMAL HIGH (ref 3.5–5.1)
Potassium: 5.8 mmol/L — ABNORMAL HIGH (ref 3.5–5.1)
Sodium: 141 mmol/L (ref 135–145)
Sodium: 141 mmol/L (ref 135–145)
Total Bilirubin: 0.5 mg/dL (ref 0.3–1.2)
Total Bilirubin: 0.9 mg/dL (ref 0.3–1.2)
Total Protein: 6.3 g/dL — ABNORMAL LOW (ref 6.5–8.1)
Total Protein: 6.8 g/dL (ref 6.5–8.1)

## 2019-06-02 LAB — MAGNESIUM
Magnesium: 2.1 mg/dL (ref 1.7–2.4)
Magnesium: 2.2 mg/dL (ref 1.7–2.4)

## 2019-06-02 LAB — CBC WITH DIFFERENTIAL/PLATELET
Abs Immature Granulocytes: 0.04 10*3/uL (ref 0.00–0.07)
Basophils Absolute: 0.1 10*3/uL (ref 0.0–0.1)
Basophils Relative: 1 %
Eosinophils Absolute: 0.2 10*3/uL (ref 0.0–0.5)
Eosinophils Relative: 2 %
HCT: 39.9 % (ref 36.0–46.0)
Hemoglobin: 12.7 g/dL (ref 12.0–15.0)
Immature Granulocytes: 0 %
Lymphocytes Relative: 21 %
Lymphs Abs: 2.1 10*3/uL (ref 0.7–4.0)
MCH: 30.8 pg (ref 26.0–34.0)
MCHC: 31.8 g/dL (ref 30.0–36.0)
MCV: 96.8 fL (ref 80.0–100.0)
Monocytes Absolute: 0.7 10*3/uL (ref 0.1–1.0)
Monocytes Relative: 7 %
Neutro Abs: 6.9 10*3/uL (ref 1.7–7.7)
Neutrophils Relative %: 69 %
Platelets: ADEQUATE 10*3/uL (ref 150–400)
RBC: 4.12 MIL/uL (ref 3.87–5.11)
RDW: 17 % — ABNORMAL HIGH (ref 11.5–15.5)
WBC: 9.9 10*3/uL (ref 4.0–10.5)
nRBC: 0 % (ref 0.0–0.2)

## 2019-06-02 LAB — GLUCOSE, CAPILLARY
Glucose-Capillary: 109 mg/dL — ABNORMAL HIGH (ref 70–99)
Glucose-Capillary: 122 mg/dL — ABNORMAL HIGH (ref 70–99)
Glucose-Capillary: 122 mg/dL — ABNORMAL HIGH (ref 70–99)
Glucose-Capillary: 153 mg/dL — ABNORMAL HIGH (ref 70–99)

## 2019-06-02 LAB — CULTURE, BLOOD (ROUTINE X 2)
Culture: NO GROWTH
Culture: NO GROWTH
Special Requests: ADEQUATE
Special Requests: ADEQUATE

## 2019-06-02 LAB — PHOSPHORUS
Phosphorus: 5.2 mg/dL — ABNORMAL HIGH (ref 2.5–4.6)
Phosphorus: 5.2 mg/dL — ABNORMAL HIGH (ref 2.5–4.6)

## 2019-06-02 LAB — D-DIMER, QUANTITATIVE: D-Dimer, Quant: 1.36 ug/mL-FEU — ABNORMAL HIGH (ref 0.00–0.50)

## 2019-06-02 LAB — C-REACTIVE PROTEIN: CRP: <0.8 mg/dL (ref <1.0)

## 2019-06-02 MED ORDER — SODIUM CHLORIDE 0.9 % IV SOLN
2.0000 g | INTRAVENOUS | Status: AC
  Administered 2019-06-02 – 2019-06-06 (×5): 2 g via INTRAVENOUS
  Filled 2019-06-02 (×5): qty 20

## 2019-06-02 NOTE — Progress Notes (Signed)
  Speech Language Pathology Treatment: Dysphagia  Patient Details Name: Betty [name] MRN: [id number] DOB: July 04, 1946 Today's Date: 06/02/2019 Time: 7276-1848 SLP Time Calculation (min) (ACUTE ONLY): 25 min  Assessment / Plan / Recommendation Clinical Impression  Pt demonstrates little interest in PO, accepted about 3 oz of food with prolonged mastication. Provided a liquid wash to clear. About 2 oz of nectar thick liquids given. Pt willing to accept, but reluctant and verbalized she didn't want it. Pt exhibited consistent light throat clearing prior to POs, during and after. Suspect sensation of NG tube is eliciting throat clear at baseline. Pt is accepting food, no signs of aspiration. Would consider removal of NG at this point to facilitate comfort. May also lead to increase in oral intake.   HPI HPI: Betty [name] is a 73 y.o. female admitted 7/9 with medical history significant of tobacco dependence; COPD, CAD; non-small cell lung CA (2016); HTN; HLD: dementia; COPD; stage IV CKD; and chronic combined CHF presenting with AMS. Intubated 7/10-7/13 and reintubated 7/13-7/21.      SLP Plan  Continue with current plan of care       Recommendations  Diet recommendations: Dysphagia 2 (fine chop);Nectar-thick liquid Liquids provided via: Straw;Cup Medication Administration: Whole meds with puree Supervision: Full supervision/cueing for compensatory strategies Compensations: Slow rate;Small sips/bites Postural Changes and/or Swallow Maneuvers: Seated upright 90 degrees                Oral Care Recommendations: Oral care BID Follow up Recommendations: Skilled Nursing facility SLP Visit Diagnosis: Dysphagia, unspecified (R13.10) Plan: Continue with current plan of care       GO               Herbie [place], MA [place] Pager [phone] Office [phone]  Lynann Beaver 06/02/2019, 9:57 AM

## 2019-06-02 NOTE — Progress Notes (Addendum)
PROGRESS NOTE    Betty [name]  BPZ:[id number] DOB: 06-18-1946 DOA: 05/13/2019 PCP: Josetta Huddle, MD   Brief Narrative:  73 year old BF PMHx dementia, CAD, NSCLC in 2016, HTN, HLD COPD, kidney stage IV, hronic kidney disease stage IV, chronic systolic and diastolic CHF   brought to the hospital by the patient's son due to increased confusion and weakness. She was diagnosed with coronavirus in the ED on 7/9. She was also found to be hypoxic requiring 2 L nasal cannula. She was admitted to the hospital and placed on steroids along with Remdesivir, deferred to ICU on 7/10, where she required intubation, she was extubated on 7/13, where she was reintubated that same day due to encephalopathy, was extubated 7/21   Subjective: 7/28 patient will only answer yes and no questions by nodding her head.  Seems more lethargic today.     Assessment & Plan:   Principal Problem:   COVID-19 Active Problems:   Non-small cell carcinoma of right lung, stage 1 (HCC)   CKD (chronic kidney disease), stage IV (HCC)   Chronic systolic heart failure (HCC)   Dementia (HCC)   Essential hypertension   Acute respiratory failure with hypoxemia (HCC)   Pressure injury of skin   Altered mental status   Acute respiratory failure with hypoxia/COVID 19 pneumonia Recent Labs  Lab 05/31/19 0739 06/01/19 0208 06/02/19 0659  CRP <0.8 <0.8 <0.8   Recent Labs  Lab 05/31/19 0404 06/01/19 0208 06/02/19 0659  DDIMER 1.48* 1.48* 1.36*  -Completed course of antibiotics, Remdesavir+ Steroids --Actemra x1 administered 7/10.   - Intubated x2 while hospitalized. -Titrate O2 to maintain SPO2 89 to 93% -DuoNeb every 6 hours - 7/28 SNF COVID labs pending  Septic shock  -Venous blood gas showed saturation of 75 suggesting more of a sepsis etiology rather than cardiogenic. - Resolved  UTI? - Patient positive gram-negative rod given her immunocompromise state, COVID pneumonia, AMS will treat - 7/28 ceftriaxone  empirically until speciation/susceptibility of bacteria  Chronic systolic and diastolic CHF -Strict in and out +10.1 L -Daily weight Filed Weights   05/31/19 0449 06/01/19 0600 06/02/19 0330  Weight: 57.4 kg 58.1 kg 57.1 kg  - June 2020 echocardiogram EF 30 to 35%.    A. fib with RVR -Labetalol PRN - On metoprolol prior to admission.  Continue metoprolol 25 mg twice daily - Briefly on amiodarone drip. - Currently rate controlled paced rhythm - On Lovenox - 7/28 decision will have to be made on if safe to send patient home on anticoagulant given her propensity for falls.  My thought would be to place her on full-strength aspirin and forego any serious anticoagulation  Lower GI bleed - During his hospitalization patient noted to have bloody stools, gastric lavage did not show any blood in the stomach. - Transfuse 1 unit PRBC - Currently blood noticed around Flexi-Seal and watery fluid within her gastric bag possibly positive for blood. - 7/26 fecal occult blood negative   Acute blood loss anemia secondary to GI bleed  -GI bleed may be reoccurring.  See lower GI bleed Recent Labs  Lab 05/30/19 0538 05/31/19 0404 05/31/19 0815 06/01/19 0208 06/02/19 0659  HGB 12.2 12.3 12.2 12.3 12.7  -Currently stable  Hypernatremia Recent Labs  Lab 05/30/19 0538 05/31/19 0404 05/31/19 0815 06/01/19 0208 06/02/19 0659  NA 146* 145 145 145 141  -Continue free water 300 ml every 6 hours   Hypokalemia -Potassium goal> 4 - Labs showing patient to be hyperkalemic however blood has  hemolyzed twice, therefore will not treat. -Monitor closely  Acute on CKD stage IV (baseline Cr 2- 3) Recent Labs  Lab 05/27/19 0445 05/28/19 0300 05/28/19 1300 05/29/19 0950 05/30/19 0538 05/31/19 0404 05/31/19 0815 06/01/19 0208 06/02/19 0659  CREATININE 1.98* 1.86* 1.98* 1.80* 1.84* 1.90* 1.92* 2.04* 2.26*  - Better than baseline  Prediabetes vs hyperglycemia - 7/1 hemoglobin A1c= 6.9  -Prior EMR has had multiple episodes of steroids in the past 1 to 2 months for acute gout as well as COVID.  Patient now off steroids - Levemir 10 units daily - Resistant SSI  Dementia/acute metabolic encephalopathy - Discussed case with PCCM staff patient's baseline is; able to care for herself independently, yesterday post extubation was able to carry on a conversation respond appropriately to commands. - Today's cognitive evaluation showed a marked difference, patient was lethargic open her eyes when her name was called but did not follow commands.  Withdrew to painful stimuli. -Patient still very confused though awake dementia vs hospital delirium -7/28 patient much more alert although refuses to answer questions verbally, will nod yes and no to questions appropriately.Marland Kitchen  Hx NSCLC/COPD -S/p RLL lobectomy + 4 cycles of adjuvant chemotherapy -Lung cancer appears to be in remission per EMR   Hx gout  -Per EMR last 1 to 90-month treated for acute gout with steroids.  Does not take steroids on a regular basis -7/27 uric acid= 6.1  Nutrition -7/27 requested speech reevaluate patient's dysphagia.  Speech recommends dysphagia 2  nectar thick liquid -7/28 speech is cleared patient for diet however patient's caloric intake extremely poor right now do not believe she is meeting her requirements through p.o. intake. - 7/28 calorie count started  Goals of care - 7/27 next 48 hours return to SNF.   - 7/27 social work consult placed return patient to SNF -7/27 PT/OT consult; evaluate patient for return to SNF: Recommend return patient to SNF -7/28-second social work consult placed return patient to SNF   DVT prophylaxis: Lovenox Code Status: DNR Family Communication: None.   Disposition Plan: TBD   Consultants:  PCCM    Procedures/Significant Events:  6/17 echocardiogram:Left Ventricle: The left ventricle has moderate-severely reduced systolic function. -EF 30-35%.  -Left  ventricular diffuse hypokinesis.    I have personally reviewed and interpreted all radiology studies and my findings are as above.  VENTILATOR SETTINGS:    Cultures 7/13 MRSA by PCR negative 7/17 tracheal aspirate normal respiratory flora 7/23 blood LEFT hand negative 7/23 blood RIGHT hand negative 7/26 urine positive gram-negative rods    Antimicrobials: Anti-infectives (From admission, onward)   Start     Stop   06/02/19 1800  cefTRIAXone (ROCEPHIN) 2 g in sodium chloride 0.9 % 100 mL IVPB         05/22/19 1130  vancomycin (VANCOCIN) 1,250 mg in sodium chloride 0.9 % 250 mL IVPB     05/22/19 1302   05/22/19 1057  vancomycin variable dose per unstable renal function (pharmacist dosing)  Status:  Discontinued     05/24/19 1406   05/19/19 1030  piperacillin-tazobactam (ZOSYN) IVPB 2.25 g     05/25/19 2359   05/15/19 1230  remdesivir 100 mg in sodium chloride 0.9 % 250 mL IVPB     05/18/19 1340   05/14/19 1230  remdesivir 200 mg in sodium chloride 0.9 % 250 mL IVPB     05/14/19 1551      Devices    LINES / TUBES:      Continuous Infusions: .  sodium chloride Stopped (05/27/19 0654)  . dextrose Stopped (05/27/19 1546)  . feeding supplement (OSMOLITE 1.2 CAL) 1,000 mL (06/01/19 1416)     Objective: Vitals:   06/01/19 2013 06/01/19 2301 06/02/19 0330 06/02/19 0843  BP: 121/83 136/74 117/73 107/81  Pulse: 88 95 91 100  Resp: 20 18 20 20   Temp: 97.6 F (36.4 C) 98.3 F (36.8 C) (!) 96.8 F (36 C) 97.6 F (36.4 C)  TempSrc: Oral Axillary Axillary Axillary  SpO2: 93% 93% 93% 94%  Weight:   57.1 kg   Height:        Intake/Output Summary (Last 24 hours) at 06/02/2019 0924 Last data filed at 06/01/2019 1500 Gross per 24 hour  Intake 50 ml  Output 1050 ml  Net -1000 ml   Filed Weights   05/31/19 0449 06/01/19 0600 06/02/19 0330  Weight: 57.4 kg 58.1 kg 57.1 kg   .Physical Exam:  General: More alert refuses to verbally answer questions nods her head  yes and no appropriately to questions, no acute respiratory distress (RA: SPO2 93%) Eyes: negative scleral hemorrhage, negative anisocoria, negative icterus ENT: Negative Runny nose, negative gingival bleeding, Neck:  Negative scars, masses, torticollis, lymphadenopathy, JVD Lungs: Clear to auscultation bilaterally without wheezes or crackles Cardiovascular: Regular rate and rhythm without murmur gallop or rub normal S1 and S2 Abdomen: negative abdominal pain, nondistended, positive soft, bowel sounds, no rebound, no ascites, no appreciable mass Extremities: No significant cyanosis, clubbing, or edema bilateral lower extremities Skin: Negative rashes, lesions, ulcers Psychiatric: Unable to evaluate secondary to patient's refusal to answer questions   Central nervous system: Spontaneously moves all extremities, withdraws to painful stimuli      Data Reviewed: Care during the described time interval was provided by me .  I have reviewed this patient's available data, including medical history, events of note, physical examination, and all test results as part of my evaluation.   CBC: Recent Labs  Lab 05/28/19 0300  05/28/19 1300 05/30/19 0538 05/31/19 0404 05/31/19 0815 06/01/19 0208 06/02/19 0659  WBC 14.7*  --  16.5* 14.0* 13.5* 14.2* 12.8* 9.9  NEUTROABS 12.3*  --  14.9*  --   --  11.7* 10.4* 6.9  HGB 13.0   < > 13.1 12.2 12.3 12.2 12.3 12.7  HCT 40.3   < > 39.9 37.6 39.0 37.2 37.7 39.9  MCV 94.6  --  94.3 96.2 96.1 94.7 96.2 96.8  PLT 252  --  258 199 215 218 227 PLATELET CLUMPS NOTED ON SMEAR, COUNT APPEARS ADEQUATE   < > = values in this interval not displayed.   Basic Metabolic Panel: Recent Labs  Lab 05/27/19 1511 05/28/19 0300  05/28/19 1630 05/29/19 0950 05/30/19 0538 05/31/19 0404 05/31/19 0815 06/01/19 0208 06/02/19 0659  NA  --  149*   < >  --  151* 146* 145 145 145 141  K  --  3.6   < >  --  4.5 4.8 4.4 4.9 4.7 5.8*  CL  --  117*   < >  --  124* 117* 112*  111 109 107  CO2  --  19*   < >  --  19* 21* 21* 21* 21* 22  GLUCOSE  --  115*   < >  --  132* 120* 106* 130* 96 120*  BUN  --  56*   < >  --  53* 51* 52* 51* 59* 58*  CREATININE  --  1.86*   < >  --  1.80* 1.84* 1.90* 1.92* 2.04* 2.26*  CALCIUM  --  9.7   < >  --  9.7 9.3 9.4 9.3 9.2 9.2  MG 1.9 2.1   < > 2.4 2.3  --  2.0  --  2.2 2.1  PHOS 3.9 4.0  --  3.8  --   --   --   --  5.2* 5.2*   < > = values in this interval not displayed.   GFR: Estimated Creatinine Clearance: 18.9 mL/min (A) (by C-G formula based on SCr of 2.26 mg/dL (H)). Liver Function Tests: Recent Labs  Lab 05/28/19 0300 05/28/19 1300 05/31/19 0815 06/01/19 0208 06/02/19 0659  AST 38 34 29 28 36  ALT 30 27 27 26 26   ALKPHOS 68 68 64 67 65  BILITOT 0.7 0.9 0.4 0.4 0.5  PROT 6.5 6.8 6.0* 6.2* 6.3*  ALBUMIN 3.1* 3.2* 2.9* 3.0* 3.0*   No results for input(s): LIPASE, AMYLASE in the last 168 hours. No results for input(s): AMMONIA in the last 168 hours. Coagulation Profile: Recent Labs  Lab 05/28/19 1300  INR 1.1   Cardiac Enzymes: No results for input(s): CKTOTAL, CKMB, CKMBINDEX, TROPONINI in the last 168 hours. BNP (last 3 results) No results for input(s): PROBNP in the last 8760 hours. HbA1C: No results for input(s): HGBA1C in the last 72 hours. CBG: Recent Labs  Lab 06/01/19 1633 06/01/19 1725 06/01/19 2010 06/01/19 2346 06/02/19 0333  GLUCAP 64* 111* 142* 109* 122*   Lipid Profile: No results for input(s): CHOL, HDL, LDLCALC, TRIG, CHOLHDL, LDLDIRECT in the last 72 hours. Thyroid Function Tests: No results for input(s): TSH, T4TOTAL, FREET4, T3FREE, THYROIDAB in the last 72 hours. Anemia Panel: No results for input(s): VITAMINB12, FOLATE, FERRITIN, TIBC, IRON, RETICCTPCT in the last 72 hours. Urine analysis:    Component Value Date/Time   COLORURINE YELLOW 05/21/2019 1541   APPEARANCEUR CLEAR 05/21/2019 1541   LABSPEC 1.012 05/21/2019 1541   PHURINE 5.0 05/21/2019 1541   GLUCOSEU  NEGATIVE 05/21/2019 1541   HGBUR MODERATE (A) 05/21/2019 1541   BILIRUBINUR NEGATIVE 05/21/2019 1541   KETONESUR NEGATIVE 05/21/2019 1541   PROTEINUR NEGATIVE 05/21/2019 1541   UROBILINOGEN 0.2 04/27/2015 0834   NITRITE NEGATIVE 05/21/2019 1541   LEUKOCYTESUR NEGATIVE 05/21/2019 1541   Sepsis Labs: @LABRCNTIP (procalcitonin:4,lacticidven:4)  ) Recent Results (from the past 240 hour(s))  Culture, blood (routine x 2)     Status: None   Collection Time: 05/28/19  4:30 PM   Specimen: BLOOD  Result Value Ref Range Status   Specimen Description   Final    BLOOD LEFT HAND Performed at [facility], [place] [address] [place], [place] [address]    Special Requests   Final    BOTTLES DRAWN AEROBIC ONLY Blood Culture adequate volume Performed at [place] [address] [place], [place] [address]    Culture   Final    NO GROWTH 5 DAYS Performed at [facility], [place] [address], [place], [place] [address]    Report Status 06/02/2019 FINAL  Final  Culture, blood (routine x 2)     Status: None   Collection Time: 05/28/19  4:36 PM   Specimen: BLOOD  Result Value Ref Range Status   Specimen Description   Final    BLOOD RIGHT HAND Performed at [place] [address] [place], [place] [address]    Special Requests   Final    BOTTLES DRAWN AEROBIC ONLY Blood Culture adequate  volume Performed at [facility], [place] [address], [place], [place] [address]    Culture   Final    NO GROWTH 5 DAYS Performed at [facility], [place] [address] [place], [place] [address]    Report Status 06/02/2019 FINAL  Final  Culture, Urine     Status: Abnormal (Preliminary result)   Collection Time: 05/31/19  4:24 AM   Specimen: Urine, Random  Result Value Ref Range Status   Specimen Description   Final    URINE, RANDOM Performed at [place] [address], [place], [place] [address]     Special Requests   Final    NONE Performed at [facility], [place] [address], [place], [place] [address]    Culture 70,000 COLONIES/mL GRAM NEGATIVE RODS (A)  Final   Report Status PENDING  Incomplete         Radiology Studies: No results found.      Scheduled Meds: . chlorhexidine  15 mL Mouth Rinse BID  . Chlorhexidine Gluconate Cloth  6 each Topical Daily  . docusate  100 mg Oral BID  . enoxaparin (LOVENOX) injection  50 mg Subcutaneous Q24H  . feeding supplement (PRO-STAT SUGAR FREE 64)  30 mL Per Tube Daily  . free water  300 mL Per Tube Q6H  . insulin aspart  0-20 Units Subcutaneous Q4H  . insulin detemir  10 Units Subcutaneous Daily  . Ipratropium-Albuterol  1 puff Inhalation Q6H  . mouth rinse  15 mL Mouth Rinse q12n4p  . metoprolol tartrate  25 mg Per Tube BID  . pantoprazole (PROTONIX) IV  40 mg Intravenous Q24H  . sodium chloride flush  3 mL Intravenous Q12H   Continuous Infusions: . sodium chloride Stopped (05/27/19 0654)  . dextrose Stopped (05/27/19 1546)  . feeding supplement (OSMOLITE 1.2 CAL) 1,000 mL (06/01/19 1416)     LOS: 19 days   The patient is critically ill with multiple organ systems failure and requires high complexity decision making for assessment and support, frequent evaluation and titration of therapies, application of advanced monitoring technologies and extensive interpretation of multiple databases. Critical Care Time devoted to patient care services described in this note  Time spent: 40 minutes     Emerson Schreifels, Geraldo Docker, MD [facility] Pager [phone]  If 7PM-7AM, please contact night-coverage [email or web address] Password [employer] 06/02/2019, 9:24 AM

## 2019-06-02 NOTE — Progress Notes (Signed)
Spoke to patient's son Chrissie Noa over the phone.  Updated him on the plan of care.

## 2019-06-03 LAB — COMPREHENSIVE METABOLIC PANEL
ALT: 24 U/L (ref 0–44)
AST: 40 U/L (ref 15–41)
Albumin: 3 g/dL — ABNORMAL LOW (ref 3.5–5.0)
Alkaline Phosphatase: 73 U/L (ref 38–126)
Anion gap: 14 (ref 5–15)
BUN: 70 mg/dL — ABNORMAL HIGH (ref 8–23)
CO2: 22 mmol/L (ref 22–32)
Calcium: 9.1 mg/dL (ref 8.9–10.3)
Chloride: 103 mmol/L (ref 98–111)
Creatinine, Ser: 2.32 mg/dL — ABNORMAL HIGH (ref 0.44–1.00)
GFR calc Af Amer: 23 mL/min — ABNORMAL LOW (ref >60)
GFR calc non Af Amer: 20 mL/min — ABNORMAL LOW (ref >60)
Glucose, Bld: 99 mg/dL (ref 70–99)
Potassium: 5.1 mmol/L (ref 3.5–5.1)
Sodium: 139 mmol/L (ref 135–145)
Total Bilirubin: 0.6 mg/dL (ref 0.3–1.2)
Total Protein: 6.5 g/dL (ref 6.5–8.1)

## 2019-06-03 LAB — CBC WITH DIFFERENTIAL/PLATELET
Abs Immature Granulocytes: 0.05 10*3/uL (ref 0.00–0.07)
Basophils Absolute: 0.1 10*3/uL (ref 0.0–0.1)
Basophils Relative: 1 %
Eosinophils Absolute: 0.2 10*3/uL (ref 0.0–0.5)
Eosinophils Relative: 1 %
HCT: 37.9 % (ref 36.0–46.0)
Hemoglobin: 12.5 g/dL (ref 12.0–15.0)
Immature Granulocytes: 1 %
Lymphocytes Relative: 19 %
Lymphs Abs: 2.1 10*3/uL (ref 0.7–4.0)
MCH: 31.4 pg (ref 26.0–34.0)
MCHC: 33 g/dL (ref 30.0–36.0)
MCV: 95.2 fL (ref 80.0–100.0)
Monocytes Absolute: 0.8 10*3/uL (ref 0.1–1.0)
Monocytes Relative: 7 %
Neutro Abs: 8 10*3/uL — ABNORMAL HIGH (ref 1.7–7.7)
Neutrophils Relative %: 71 %
Platelets: 200 10*3/uL (ref 150–400)
RBC: 3.98 MIL/uL (ref 3.87–5.11)
RDW: 16.8 % — ABNORMAL HIGH (ref 11.5–15.5)
WBC: 11.1 10*3/uL — ABNORMAL HIGH (ref 4.0–10.5)
nRBC: 0 % (ref 0.0–0.2)

## 2019-06-03 LAB — D-DIMER, QUANTITATIVE: D-Dimer, Quant: 1.78 ug/mL-FEU — ABNORMAL HIGH (ref 0.00–0.50)

## 2019-06-03 LAB — PHOSPHORUS: Phosphorus: 5.6 mg/dL — ABNORMAL HIGH (ref 2.5–4.6)

## 2019-06-03 LAB — GLUCOSE, CAPILLARY
Glucose-Capillary: 150 mg/dL — ABNORMAL HIGH (ref 70–99)
Glucose-Capillary: 119 mg/dL — ABNORMAL HIGH (ref 70–99)
Glucose-Capillary: 113 mg/dL — ABNORMAL HIGH (ref 70–99)
Glucose-Capillary: 134 mg/dL — ABNORMAL HIGH (ref 70–99)
Glucose-Capillary: 90 mg/dL (ref 70–99)
Glucose-Capillary: 158 mg/dL — ABNORMAL HIGH (ref 70–99)
Glucose-Capillary: 69 mg/dL — ABNORMAL LOW (ref 70–99)
Glucose-Capillary: 124 mg/dL — ABNORMAL HIGH (ref 70–99)
Glucose-Capillary: 169 mg/dL — ABNORMAL HIGH (ref 70–99)
Glucose-Capillary: 172 mg/dL — ABNORMAL HIGH (ref 70–99)

## 2019-06-03 LAB — C-REACTIVE PROTEIN: CRP: <0.8 mg/dL (ref <1.0)

## 2019-06-03 LAB — MAGNESIUM: Magnesium: 2.1 mg/dL (ref 1.7–2.4)

## 2019-06-03 MED ORDER — OSMOLITE 1.5 CAL PO LIQD
1000.0000 mL | ORAL | Status: DC
  Administered 2019-06-03 – 2019-06-05 (×3): 1000 mL
  Filled 2019-06-03 (×4): qty 1000

## 2019-06-03 MED ORDER — ENSURE ENLIVE PO LIQD
237.0000 mL | Freq: Three times a day (TID) | ORAL | Status: DC
  Administered 2019-06-03 – 2019-06-13 (×24): 237 mL via ORAL

## 2019-06-03 NOTE — Progress Notes (Signed)
PROGRESS NOTE    Betty [name]  EQA:[id number] DOB: April 14, 1946 DOA: 05/13/2019 PCP: Josetta Huddle, MD   Brief Narrative:  73 year old BF PMHx dementia, CAD, NSCLC in 2016, HTN, HLD COPD, kidney stage IV, hronic kidney disease stage IV, chronic systolic and diastolic CHF. Pt brought to the hospital by the patient's son due to increased confusion and weakness. She was diagnosed with coronavirus in the ED on 7/9. She was also found to be hypoxic requiring 2 L nasal cannula. She was admitted to the hospital and placed on steroids along with Remdesivir, deferred to ICU on 7/10, where she required intubation, she was extubated on 7/13, where she was reintubated that same day due to encephalopathy, was extubated 7/21   Subjective: No acute issues or events overnight, more awake and alert this morning able to answer simple questions yes or no review of systems which were essentially unremarkable.  Nursing indicates patient attempted to eat a few bites of her meal last night without incident however patient refused to continue eating despite multiple attempts.  Patient currently declines chest pain, shortness of breath, nausea, vomiting, diarrhea, constipation, headache, fever, chills.   Assessment & Plan:   Principal Problem:   COVID-19 Active Problems:   Non-small cell carcinoma of right lung, stage 1 (HCC)   CKD (chronic kidney disease), stage IV (HCC)   Chronic systolic heart failure (HCC)   Dementia (HCC)   Essential hypertension   Acute respiratory failure with hypoxemia (HCC)   Pressure injury of skin   Altered mental status   Acute respiratory failure with hypoxia/COVID 19 pneumonia -Completed course of Remdesivir, Actemra -Intubated 7/10, extubated 7/13 with subsequent reintubation that same day: successfully extubated 7/21 -DuoNeb every 6 hours Recent Labs    06/01/19 0208 06/02/19 0659 06/03/19 0828  DDIMER 1.48* 1.36* 1.78*  CRP <0.8 <0.8 <9.7    Acute metabolic  encephalopathy, likely in the setting of sepsis with UTI, likely POA septic shock ruled out -Continue ceftriaxone empirically -Cultures remarkable for 70,000 colonies of gram-negative rods, likely E. coli given history -Mental status appears to be improving per nursing at bedside and compared to previous documentation -Patient today alert and oriented to person general situation and year, unable to properly name the month but otherwise quite coherent with question and answers   Chronic systolic and diastolic CHF -EF 30 to 98%, not in acute exacerbation -Strict in and out/Daily weight Filed Weights   06/01/19 0600 06/02/19 0330 06/03/19 0500  Weight: 58.1 kg 57.1 kg 54.5 kg  - June 2020 echocardiogram EF 30 to 35%.    Newly diagnosed/new onset: A. fib with RVR, rate controlled - Continue home metoprolol - Briefly on amiodarone drip but now discontinued. - On Lovenox - Patient is likely high risk for falls, recent GI bleed, advanced age and not a candidate for anticoagulation. Recommend full dose aspirin at this point - can follow with PCP for further evaluation and treatment.  Lower GI bleed, resolved - During his hospitalization patient noted to have bloody stools, gastric lavage did not show any blood in the stomach - not a candidate for endoscopy per previous documentation. - status post 1 u PRBC previously - Currently blood noticed around Flexi-Seal and watery fluid within her gastric bag possibly positive for blood. - 7/26 fecal occult blood negative  - H/H stable  Symptomatic Hypernatremia, resolving Recent Labs  Lab 05/31/19 0815 06/01/19 0208 06/02/19 0659 06/02/19 0913 06/03/19 0828  NA 145 145 141 141 139  -Continue free water  300 ml every 6 hours   Hypokalemia, resolved ?Hyperkalemia vs hemolyzed blood samples -Potassium goal> 4 -K 5.1 today  Acute on CKD stage IV (baseline Cr 2- 3) Recent Labs  Lab 05/28/19 1300 05/29/19 0950 05/30/19 0538 05/31/19 0404  05/31/19 0815 06/01/19 0208 06/02/19 0659 06/02/19 0913 06/03/19 0828  CREATININE 1.98* 1.80* 1.84* 1.90* 1.92* 2.04* 2.26* 2.36* 2.32*  - Near baseline  Prediabetes vs hyperglycemia - 7/1 hemoglobin A1c= 6.9 -Prior EMR has had multiple episodes of steroids in the past 1 to 2 months for acute gout as well as COVID.  Patient now off steroids - Levemir 10 units daily - Resistant SSI  Dementia - Discussed case with PCCM staff patient's baseline is; able to care for herself independently  Hx NSCLC/COPD -S/p RLL lobectomy + 4 cycles of adjuvant chemotherapy -Lung cancer appears to be in remission per EMR   Hx gout  -Per EMR last 1 to 48-month treated for acute gout with steroids.  Does not take steroids on a regular basis -7/27 uric acid= 6.1  Nutrition -7/27 requested speech reevaluate patient's dysphagia.  Speech recommends dysphagia 2  nectar thick liquid -7/28 speech is cleared patient for diet however patient's caloric intake extremely poor right now do not believe she is meeting her requirements through p.o. intake. - 7/29 initiate night tube feedings until patient can tolerate PO adequately enough to maintain caloric intake  DVT prophylaxis: Lovenox Code Status: DNR Family Communication: None.   Disposition Plan: Likely to return to SNF pending clinical improvement  Consultants:  PCCM  Procedures/Significant Events:  6/17 echocardiogram:Left Ventricle: The left ventricle has moderate-severely reduced systolic function. -EF 30-35%.  -Left ventricular diffuse hypokinesis.  I have personally reviewed and interpreted all radiology studies and my findings are as above.  Cultures 7/13 MRSA by PCR negative 7/17 tracheal aspirate normal respiratory flora 7/23 blood LEFT hand negative 7/23 blood RIGHT hand negative 7/26 urine positive gram-negative rods   Antimicrobials: Anti-infectives (From admission, onward)   Start     Stop   06/02/19 1800  cefTRIAXone  (ROCEPHIN) 2 g in sodium chloride 0.9 % 100 mL IVPB         05/22/19 1130  vancomycin (VANCOCIN) 1,250 mg in sodium chloride 0.9 % 250 mL IVPB     05/22/19 1302   05/22/19 1057  vancomycin variable dose per unstable renal function (pharmacist dosing)  Status:  Discontinued     05/24/19 1406   05/19/19 1030  piperacillin-tazobactam (ZOSYN) IVPB 2.25 g     05/25/19 2359   05/15/19 1230  remdesivir 100 mg in sodium chloride 0.9 % 250 mL IVPB     05/18/19 1340   05/14/19 1230  remdesivir 200 mg in sodium chloride 0.9 % 250 mL IVPB     05/14/19 1551     Continuous Infusions: . sodium chloride Stopped (05/27/19 0654)  . cefTRIAXone (ROCEPHIN)  IV 2 g (06/02/19 1818)  . dextrose Stopped (05/27/19 1546)    Objective: Vitals:   06/03/19 0300 06/03/19 0500 06/03/19 0801 06/03/19 1142  BP: 119/65  131/77 126/78  Pulse: 100  83 89  Resp: 19  18 (!) 23  Temp: 98.2 F (36.8 C)  (!) 97.3 F (36.3 C) (!) 97.2 F (36.2 C)  TempSrc: Axillary  Oral Axillary  SpO2: 99%  99% 99%  Weight:  54.5 kg    Height:        Intake/Output Summary (Last 24 hours) at 06/03/2019 1454 Last data filed at 06/03/2019 0900 Gross  per 24 hour  Intake 2131.13 ml  Output 1500 ml  Net 631.13 ml   Filed Weights   06/01/19 0600 06/02/19 0330 06/03/19 0500  Weight: 58.1 kg 57.1 kg 54.5 kg   .Physical Exam:  General: Awake, alert, AO to person, place and general situation - incorrect month but correct year Eyes: negative scleral hemorrhage, negative anisocoria, negative icterus ENT: Negative Runny nose, negative gingival bleeding, Neck:  Negative scars, masses, torticollis, lymphadenopathy, JVD Lungs: Clear to auscultation bilaterally without wheezes or crackles Cardiovascular: Regular rate and rhythm without murmur gallop or rub normal S1 and S2 Abdomen: negative abdominal pain, nondistended, positive soft, bowel sounds, no rebound, no ascites, no appreciable mass Extremities: No significant cyanosis,  clubbing, or edema bilateral lower extremities Skin: Negative rashes, lesions, ulcers Psychiatric: Unable to evaluate secondary to patient's refusal to answer questions   Central nervous system: Spontaneously moves all extremities, withdraws to painful stimuli   Data Reviewed: Care during the described time interval was provided by me .  I have reviewed this patient's available data, including medical history, events of note, physical examination, and all test results as part of my evaluation.   CBC: Recent Labs  Lab 05/28/19 1300  05/31/19 0404 05/31/19 0815 06/01/19 0208 06/02/19 0659 06/03/19 0828  WBC 16.5*   < > 13.5* 14.2* 12.8* 9.9 11.1*  NEUTROABS 14.9*  --   --  11.7* 10.4* 6.9 8.0*  HGB 13.1   < > 12.3 12.2 12.3 12.7 12.5  HCT 39.9   < > 39.0 37.2 37.7 39.9 37.9  MCV 94.3   < > 96.1 94.7 96.2 96.8 95.2  PLT 258   < > 215 218 227 PLATELET CLUMPS NOTED ON SMEAR, COUNT APPEARS ADEQUATE 200   < > = values in this interval not displayed.   Basic Metabolic Panel: Recent Labs  Lab 05/28/19 1630  05/31/19 0404 05/31/19 0815 06/01/19 5277 06/02/19 0659 06/02/19 0913 06/03/19 0828  NA  --    < > 145 145 145 141 141 139  K  --    < > 4.4 4.9 4.7 5.8* 5.8* 5.1  CL  --    < > 112* 111 109 107 106 103  CO2  --    < > 21* 21* 21* 22 25 22   GLUCOSE  --    < > 106* 130* 96 120* 142* 99  BUN  --    < > 52* 51* 59* 58* 62* 70*  CREATININE  --    < > 1.90* 1.92* 2.04* 2.26* 2.36* 2.32*  CALCIUM  --    < > 9.4 9.3 9.2 9.2 9.4 9.1  MG 2.4   < > 2.0  --  2.2 2.1 2.2 2.1  PHOS 3.8  --   --   --  5.2* 5.2* 5.2* 5.6*   < > = values in this interval not displayed.   GFR: Estimated Creatinine Clearance: 18.3 mL/min (A) (by C-G formula based on SCr of 2.32 mg/dL (H)). Liver Function Tests: Recent Labs  Lab 05/31/19 0815 06/01/19 0208 06/02/19 0659 06/02/19 0913 06/03/19 0828  AST 29 28 36 46* 40  ALT 27 26 26 26 24   ALKPHOS 64 67 65 68 73  BILITOT 0.4 0.4 0.5 0.9 0.6  PROT  6.0* 6.2* 6.3* 6.8 6.5  ALBUMIN 2.9* 3.0* 3.0* 3.2* 3.0*   No results for input(s): LIPASE, AMYLASE in the last 168 hours. No results for input(s): AMMONIA in the last 168 hours. Coagulation Profile: Recent  Labs  Lab 05/28/19 1300  INR 1.1   Cardiac Enzymes: No results for input(s): CKTOTAL, CKMB, CKMBINDEX, TROPONINI in the last 168 hours. BNP (last 3 results) No results for input(s): PROBNP in the last 8760 hours. HbA1C: No results for input(s): HGBA1C in the last 72 hours. CBG: Recent Labs  Lab 06/02/19 1957 06/03/19 0100 06/03/19 0403 06/03/19 0800 06/03/19 1141  GLUCAP 119* 113* 134* 90 158*   Lipid Profile: No results for input(s): CHOL, HDL, LDLCALC, TRIG, CHOLHDL, LDLDIRECT in the last 72 hours. Thyroid Function Tests: No results for input(s): TSH, T4TOTAL, FREET4, T3FREE, THYROIDAB in the last 72 hours. Anemia Panel: No results for input(s): VITAMINB12, FOLATE, FERRITIN, TIBC, IRON, RETICCTPCT in the last 72 hours. Urine analysis:    Component Value Date/Time   COLORURINE YELLOW 05/21/2019 1541   APPEARANCEUR CLEAR 05/21/2019 1541   LABSPEC 1.012 05/21/2019 1541   PHURINE 5.0 05/21/2019 1541   GLUCOSEU NEGATIVE 05/21/2019 1541   HGBUR MODERATE (A) 05/21/2019 1541   BILIRUBINUR NEGATIVE 05/21/2019 1541   KETONESUR NEGATIVE 05/21/2019 1541   PROTEINUR NEGATIVE 05/21/2019 1541   UROBILINOGEN 0.2 04/27/2015 0834   NITRITE NEGATIVE 05/21/2019 1541   LEUKOCYTESUR NEGATIVE 05/21/2019 1541   Sepsis Labs: @LABRCNTIP (procalcitonin:4,lacticidven:4)  ) Recent Results (from the past 240 hour(s))  Culture, blood (routine x 2)     Status: None   Collection Time: 05/28/19  4:30 PM   Specimen: BLOOD  Result Value Ref Range Status   Specimen Description   Final    BLOOD LEFT HAND Performed at [facility], [place] [address], [place], [place] [address]    Special Requests   Final    BOTTLES DRAWN AEROBIC ONLY Blood Culture adequate volume  Performed at [facility] [address] [place], [place] [address]    Culture   Final    NO GROWTH 5 DAYS Performed at [facility], [place] [address], [place], [place] [address]    Report Status 06/02/2019 FINAL  Final  Culture, blood (routine x 2)     Status: None   Collection Time: 05/28/19  4:36 PM   Specimen: BLOOD  Result Value Ref Range Status   Specimen Description   Final    BLOOD RIGHT HAND Performed at [facility] [address], [place], [place] [address]    Special Requests   Final    BOTTLES DRAWN AEROBIC ONLY Blood Culture adequate volume Performed at [facility] [address] [place], [place] [address]    Culture   Final    NO GROWTH 5 DAYS Performed at [facility], [place] [address], [place], [place] [address]    Report Status 06/02/2019 FINAL  Final  Culture, Urine     Status: Abnormal (Preliminary result)   Collection Time: 05/31/19  4:24 AM   Specimen: Urine, Random  Result Value Ref Range Status   Specimen Description   Final    URINE, RANDOM Performed at [facility] [address] [place], [place] [address]    Special Requests   Final    NONE Performed at [facility], [place] [address], [place], [place] [address]    Culture 70,000 COLONIES/mL GRAM NEGATIVE RODS (A)  Final   Report Status PENDING  Incomplete     Radiology Studies: No results found.  Scheduled Meds: . chlorhexidine  15 mL Mouth Rinse BID  . Chlorhexidine Gluconate Cloth  6 each Topical Daily  .  docusate  100 mg Oral BID  . enoxaparin (LOVENOX) injection  50 mg Subcutaneous Q24H  . feeding supplement (ENSURE ENLIVE)  237 mL Oral TID BM  . feeding supplement (OSMOLITE 1.5 CAL)  1,000 mL Per Tube Q24H  . feeding supplement (PRO-STAT SUGAR FREE 64)  30 mL Per Tube Daily  . free water  300 mL Per Tube Q6H  . insulin aspart  0-20 Units Subcutaneous Q4H  . insulin detemir   10 Units Subcutaneous Daily  . Ipratropium-Albuterol  1 puff Inhalation Q6H  . mouth rinse  15 mL Mouth Rinse q12n4p  . metoprolol tartrate  25 mg Per Tube BID  . pantoprazole (PROTONIX) IV  40 mg Intravenous Q24H  . sodium chloride flush  3 mL Intravenous Q12H   Continuous Infusions: . sodium chloride Stopped (05/27/19 0654)  . cefTRIAXone (ROCEPHIN)  IV 2 g (06/02/19 1818)  . dextrose Stopped (05/27/19 1546)     LOS: 20 days   Time spent: 40 minutes   Little Ishikawa, MD [facility] Pager [phone]  If 7PM-7AM, please contact night-coverage [email or web address] Password [email or web address] 06/03/2019, 2:54 PM

## 2019-06-03 NOTE — Progress Notes (Signed)
  Speech Language Pathology Treatment: Dysphagia  Patient Details Name: Betty [name] MRN: [id number] DOB: [birth year] Today's Date: 06/03/2019 Time: 1120-1140 SLP Time Calculation (min) (ACUTE ONLY): 20 min  Assessment / Plan / Recommendation Clinical Impression  Pt demonstrates even more improvement in in mentation. She is alert and talkative, pleasantly demented. She has no interest in PO, but will take bites and sips if offered to a point, but then says she doesn't want any more. When she swallows there is a slight grimace at times and still some slight intermittent throat clearing. She is capable of swallowing at this point. Unsure that her intake will improve as long as tube feeding continues. Tube may also be causing discomfort while swallowing. Will consider upgrading liquids in the near future as pts vocal quality continues to improve.    HPI HPI: Betty [name] is a 74 y.o. female admitted 7/9 with medical history significant of tobacco dependence; COPD, CAD; non-small cell lung CA (2016); HTN; HLD: dementia; COPD; stage IV CKD; and chronic combined CHF presenting with AMS. Intubated 7/10-7/13 and reintubated 7/13-7/21.      SLP Plan  Continue with current plan of care       Recommendations  Diet recommendations: Dysphagia 2 (fine chop);Nectar-thick liquid Liquids provided via: Straw;Cup Medication Administration: Whole meds with puree Supervision: Full supervision/cueing for compensatory strategies Compensations: Slow rate;Small sips/bites Postural Changes and/or Swallow Maneuvers: Seated upright 90 degrees                Oral Care Recommendations: Oral care BID Follow up Recommendations: Skilled Nursing facility SLP Visit Diagnosis: Dysphagia, unspecified (R13.10) Plan: Continue with current plan of care       GO               Herbie Baltimore, MA [place] Pager [phone] Office [phone]  Lynann Beaver 06/03/2019,  12:22 PM

## 2019-06-03 NOTE — Progress Notes (Signed)
Spoke with patient son Chrissie Noa and facilitated patient talking with son, questions answered and update given.

## 2019-06-03 NOTE — Progress Notes (Signed)
Received report from [place] during BSR. Pt is resting quietly in bed and denies pain, SOB and nausea. POC reviewed at the bedside, purwick in place, pt repositioned, bed alarm is on. Call light within reach

## 2019-06-03 NOTE — Progress Notes (Signed)
 Nutrition Follow-up  DOCUMENTATION CODES:   Not applicable  INTERVENTION:   Magic cup TID with meals, each supplement provides 290 kcal and 9 grams of protein  Ensure Enlive po TID, each supplement provides 350 kcal and 20 grams of protein  Feeding assistance and encouragement  Tube Feeding:  Change to nocturnal TF Osmolite 1.5 at 65 ml/hr over 14 hours (1800 to 0800 daily) This provides 1365 kcals, 58 g of protein and  692 mL of free water Meets 80% calorie needs  Continue Pro-Stat 30 mL daily per Cortrak tube Additional 100 kcals, 15 g of protein    NUTRITION DIAGNOSIS:   Inadequate oral intake related to acute illness as evidenced by NPO status.  Being addressed via diet advancement, supplements, TF   GOAL:   Patient will meet greater than or equal to 90% of their needs  Progressing  MONITOR:   Vent status, TF tolerance, Labs, Weight trends, Skin  REASON FOR ASSESSMENT:   Consult, Ventilator Enteral/tube feeding initiation and management  ASSESSMENT:   73 yo female admitted with severe acute respiratory failure from COVID-19 pneumonia requiring intubation, AKI/CKD IV.  PMH includes COPD, CKD IV, CHF, dementia, HTN, HLD, non-small cell lung ca in 2016, CAD.  7/09 Admit 7/10 Transferred to ICU and Intubated 7/13 Extubated; Re-Intubated 7/21 Extubated 7/22 Cortrak placed, TF started 7/28 SLP advanced diet to Dysphagia II, Nectar  Cortrak in place, Osmolite 1.2 at 55 ml/hr, Pro-Stat 30 mL daily, free water 300 mL q 6 hours  Diet advanced to Dysphagia II, Nectar thick. Noted calorie count ordered; spoke with RN who indicates that pt is not eating anything therefore nothing to chart with regards to po intake. Mental status remains altered with dementia at baseline; pt remains very weak  Pt with stage II PI to coccyx, newly documented since pt last assessed  Discussed nutrition poc with MD; MD agrees to continue supplement TF via Cortrak tube until po intake  improves. Plan to transition to nocturnal TF to give pt a breakfast during the day  Labs: Creatinine 2.32, BUN 70, phosphorus 5.6 (H) Meds: ss novolog, levemir   Diet Order:   Diet Order            DIET DYS 2 Room service appropriate? Yes; Fluid consistency: Nectar Thick  Diet effective now              EDUCATION NEEDS:   Not appropriate for education at this time  Skin:  Skin Assessment: Skin Integrity Issues: Skin Integrity Issues:: Stage II Stage II: coccyx  Last BM:  7/28, rectal tube removed  Height:   Ht Readings from Last 1 Encounters:  05/18/19 5\' 4"  (1.626 m)    Weight:   Wt Readings from Last 1 Encounters:  06/03/19 54.5 kg    Ideal Body Weight:  54.5 kg  BMI:  Body mass index is 20.62 kg/m.  Estimated Nutritional Needs:   Kcal:  1650-1850 kcals  Protein:  83-93 g  Fluid:  >/= 1.7 L     Shaquayla Klimas MS, RDN, LDN, CNSC [phone] Pager  [phone] Weekend/On-Call Pager

## 2019-06-04 LAB — CBC WITH DIFFERENTIAL/PLATELET
Abs Immature Granulocytes: 0.06 10*3/uL (ref 0.00–0.07)
Basophils Absolute: 0 10*3/uL (ref 0.0–0.1)
Basophils Relative: 0 %
Eosinophils Absolute: 0.2 10*3/uL (ref 0.0–0.5)
Eosinophils Relative: 2 %
HCT: 39.1 % (ref 36.0–46.0)
Hemoglobin: 12.6 g/dL (ref 12.0–15.0)
Immature Granulocytes: 1 %
Lymphocytes Relative: 19 %
Lymphs Abs: 2 10*3/uL (ref 0.7–4.0)
MCH: 31.2 pg (ref 26.0–34.0)
MCHC: 32.2 g/dL (ref 30.0–36.0)
MCV: 96.8 fL (ref 80.0–100.0)
Monocytes Absolute: 0.8 10*3/uL (ref 0.1–1.0)
Monocytes Relative: 8 %
Neutro Abs: 7.3 10*3/uL (ref 1.7–7.7)
Neutrophils Relative %: 70 %
Platelets: 249 10*3/uL (ref 150–400)
RBC: 4.04 MIL/uL (ref 3.87–5.11)
RDW: 17.1 % — ABNORMAL HIGH (ref 11.5–15.5)
WBC: 10.3 10*3/uL (ref 4.0–10.5)
nRBC: 0 % (ref 0.0–0.2)

## 2019-06-04 LAB — COMPREHENSIVE METABOLIC PANEL
ALT: 24 U/L (ref 0–44)
AST: 26 U/L (ref 15–41)
Albumin: 3.1 g/dL — ABNORMAL LOW (ref 3.5–5.0)
Alkaline Phosphatase: 75 U/L (ref 38–126)
Anion gap: 13 (ref 5–15)
BUN: 67 mg/dL — ABNORMAL HIGH (ref 8–23)
CO2: 23 mmol/L (ref 22–32)
Calcium: 9.4 mg/dL (ref 8.9–10.3)
Chloride: 106 mmol/L (ref 98–111)
Creatinine, Ser: 2.25 mg/dL — ABNORMAL HIGH (ref 0.44–1.00)
GFR calc Af Amer: 24 mL/min — ABNORMAL LOW (ref >60)
GFR calc non Af Amer: 21 mL/min — ABNORMAL LOW (ref >60)
Glucose, Bld: 89 mg/dL (ref 70–99)
Potassium: 5.1 mmol/L (ref 3.5–5.1)
Sodium: 142 mmol/L (ref 135–145)
Total Bilirubin: 0.4 mg/dL (ref 0.3–1.2)
Total Protein: 6.5 g/dL (ref 6.5–8.1)

## 2019-06-04 LAB — GLUCOSE, CAPILLARY
Glucose-Capillary: 118 mg/dL — ABNORMAL HIGH (ref 70–99)
Glucose-Capillary: 132 mg/dL — ABNORMAL HIGH (ref 70–99)
Glucose-Capillary: 138 mg/dL — ABNORMAL HIGH (ref 70–99)
Glucose-Capillary: 138 mg/dL — ABNORMAL HIGH (ref 70–99)
Glucose-Capillary: 92 mg/dL (ref 70–99)
Glucose-Capillary: 94 mg/dL (ref 70–99)

## 2019-06-04 LAB — URINE CULTURE: Culture: >=100000 — AB

## 2019-06-04 LAB — PHOSPHORUS: Phosphorus: 5.3 mg/dL — ABNORMAL HIGH (ref 2.5–4.6)

## 2019-06-04 LAB — MAGNESIUM: Magnesium: 2.2 mg/dL (ref 1.7–2.4)

## 2019-06-04 NOTE — Progress Notes (Signed)
PROGRESS NOTE    Betty [name]  GGY:[id number] DOB: 05-15-1946 DOA: 05/13/2019 PCP: Josetta Huddle, MD   Brief Narrative:  73 year old BF PMHx dementia, CAD, NSCLC in 2016, HTN, HLD COPD, kidney stage IV, hronic kidney disease stage IV, chronic systolic and diastolic CHF. Pt brought to the hospital by the patient's son due to increased confusion and weakness. She was diagnosed with coronavirus in the ED on 7/9. She was also found to be hypoxic requiring 2 L nasal cannula. She was admitted to the hospital and placed on steroids along with Remdesivir, deferred to ICU on 7/10, where she required intubation, she was extubated on 7/13, where she was reintubated that same day due to encephalopathy, was extubated 7/21   Subjective: No acute complaint no nausea no vomiting.  Still minimal oral intake.  No diarrhea reported.   Assessment & Plan:   Principal Problem:   COVID-19 Active Problems:   Non-small cell carcinoma of right lung, stage 1 (HCC)   CKD (chronic kidney disease), stage IV (HCC)   Chronic systolic heart failure (HCC)   Dementia (HCC)   Essential hypertension   Acute respiratory failure with hypoxemia (HCC)   Pressure injury of skin   Altered mental status   Acute respiratory failure with hypoxia/COVID 19 pneumonia -Completed course of Remdesivir, Actemra -Intubated 7/10, extubated 7/13 with subsequent reintubation that same day: successfully extubated 7/21 -DuoNeb every 6 hours Recent Labs    06/02/19 0659 06/03/19 0828  DDIMER 1.36* 1.78*  CRP <0.8 <0.3    Acute metabolic encephalopathy, likely in the setting of sepsis with UTI, likely POA septic shock ruled out -Continue ceftriaxone empirically -Cultures remarkable for 70,000 colonies of gram-negative rods, likely E. coli given history -Mental status appears to be improving per nursing at bedside and compared to previous documentation -Patient today alert and oriented to person general situation and year, unable  to properly name the month but otherwise quite coherent with question and answers   Chronic systolic and diastolic CHF -EF 30 to 50%, not in acute exacerbation -Strict in and out/Daily weight Filed Weights   06/02/19 0330 06/03/19 0500 06/04/19 0343  Weight: 57.1 kg 54.5 kg 46.3 kg  - June 2020 echocardiogram EF 30 to 35%.    Newly diagnosed/new onset: Paroxysmal A. fib with RVR, rate controlled - Continue home metoprolol - Briefly on amiodarone drip but now discontinued. - On Lovenox - Patient is likely high risk for falls, recent GI bleed, advanced age and not a candidate for anticoagulation. Recommend full dose aspirin at this point - can follow with PCP for further evaluation and treatment.  Lower GI bleed, resolved - During his hospitalization patient noted to have bloody stools, gastric lavage did not show any blood in the stomach - not a candidate for endoscopy per previous documentation. - status post 1 u PRBC previously - Currently blood noticed around Flexi-Seal and watery fluid within her gastric bag possibly positive for blood. - 7/26 fecal occult blood negative  - H/H stable  Symptomatic Hypernatremia, resolving Recent Labs  Lab 06/01/19 0208 06/02/19 0659 06/02/19 0913 06/03/19 0828 06/04/19 0805  NA 145 141 141 139 142  -Continue free water 300 ml every 6 hours   Hypokalemia, resolved ?Hyperkalemia vs hemolyzed blood samples -Potassium goal> 4 -K 5.1 today  Acute on CKD stage IV (baseline Cr 2- 3) Recent Labs  Lab 05/29/19 0950 05/30/19 0938 05/31/19 0404 05/31/19 0815 06/01/19 1829 06/02/19 9371 06/02/19 0913 06/03/19 0828 06/04/19 0805  CREATININE 1.80* 1.84*  1.90* 1.92* 2.04* 2.26* 2.36* 2.32* 2.25*  - Near baseline  Prediabetes vs hyperglycemia - 7/1 hemoglobin A1c= 6.9 -Prior EMR has had multiple episodes of steroids in the past 1 to 2 months for acute gout as well as COVID.  Patient now off steroids - Levemir 10 units daily -  Resistant SSI  Dementia - Discussed case with PCCM staff patient's baseline is; able to care for herself independently  Hx NSCLC/COPD -S/p RLL lobectomy + 4 cycles of adjuvant chemotherapy -Lung cancer appears to be in remission per EMR   Hx gout  -Per EMR last 1 to 68-month treated for acute gout with steroids.  Does not take steroids on a regular basis -7/27 uric acid= 6.1  Nutrition -7/27 requested speech reevaluate patient's dysphagia.  Speech recommends dysphagia 2  nectar thick liquid -7/28 speech is cleared patient for diet however patient's caloric intake extremely poor right now do not believe she is meeting her requirements through p.o. intake. - 7/29 initiate night tube feedings until patient can tolerate PO adequately enough to maintain caloric intake -If oral intake is adequate during the day can consider discontinuing the core track on 06/05/2019.  DVT prophylaxis: Lovenox Code Status: DNR Family Communication: None.   Disposition Plan: Likely to return to SNF pending clinical improvement  Consultants:  PCCM  Procedures/Significant Events:  6/17 echocardiogram:Left Ventricle: The left ventricle has moderate-severely reduced systolic function. -EF 30-35%.  -Left ventricular diffuse hypokinesis.  I have personally reviewed and interpreted all radiology studies and my findings are as above.  Cultures 7/13 MRSA by PCR negative 7/17 tracheal aspirate normal respiratory flora 7/23 blood LEFT hand negative 7/23 blood RIGHT hand negative 7/26 urine positive gram-negative rods   Antimicrobials: Anti-infectives (From admission, onward)   Start     Stop   06/02/19 1800  cefTRIAXone (ROCEPHIN) 2 g in sodium chloride 0.9 % 100 mL IVPB         05/22/19 1130  vancomycin (VANCOCIN) 1,250 mg in sodium chloride 0.9 % 250 mL IVPB     05/22/19 1302   05/22/19 1057  vancomycin variable dose per unstable renal function (pharmacist dosing)  Status:  Discontinued     05/24/19  1406   05/19/19 1030  piperacillin-tazobactam (ZOSYN) IVPB 2.25 g     05/25/19 2359   05/15/19 1230  remdesivir 100 mg in sodium chloride 0.9 % 250 mL IVPB     05/18/19 1340   05/14/19 1230  remdesivir 200 mg in sodium chloride 0.9 % 250 mL IVPB     05/14/19 1551     Continuous Infusions: . sodium chloride Stopped (05/27/19 0654)  . cefTRIAXone (ROCEPHIN)  IV 2 g (06/04/19 1725)    Objective: Vitals:   06/04/19 0343 06/04/19 0800 06/04/19 1133 06/04/19 1600  BP: 126/81 127/84 129/86 134/80  Pulse: 89 99 96 99  Resp: 20 (!) 22 (!) 22 (!) 21  Temp: 97.6 F (36.4 C) 97.7 F (36.5 C) 97.8 F (36.6 C) (!) 96.9 F (36.1 C)  TempSrc: Oral Axillary Oral Axillary  SpO2:  99% 94% 100%  Weight: 46.3 kg     Height:        Intake/Output Summary (Last 24 hours) at 06/04/2019 1821 Last data filed at 06/04/2019 1740 Gross per 24 hour  Intake 3034.75 ml  Output -  Net 3034.75 ml   Filed Weights   06/02/19 0330 06/03/19 0500 06/04/19 0343  Weight: 57.1 kg 54.5 kg 46.3 kg   .Physical Exam:  General: Awake, alert,  AO to person, place and general situation - incorrect month but correct year Eyes: negative scleral hemorrhage, negative anisocoria, negative icterus ENT: Negative Runny nose, negative gingival bleeding, Neck:  Negative scars, masses, torticollis, lymphadenopathy, JVD Lungs: Clear to auscultation bilaterally without wheezes or crackles Cardiovascular: Regular rate and rhythm without murmur gallop or rub normal S1 and S2 Abdomen: negative abdominal pain, nondistended, positive soft, bowel sounds, no rebound, no ascites, no appreciable mass Extremities: No significant cyanosis, clubbing, or edema bilateral lower extremities Skin: Negative rashes, lesions, ulcers Psychiatric: Unable to evaluate secondary to patient's refusal to answer questions   Central nervous system: Spontaneously moves all extremities, withdraws to painful stimuli   Data Reviewed: Care during the  described time interval was provided by me .  I have reviewed this patient's available data, including medical history, events of note, physical examination, and all test results as part of my evaluation.   CBC: Recent Labs  Lab 05/31/19 0815 06/01/19 0208 06/02/19 0659 06/03/19 0828 06/04/19 0805  WBC 14.2* 12.8* 9.9 11.1* 10.3  NEUTROABS 11.7* 10.4* 6.9 8.0* 7.3  HGB 12.2 12.3 12.7 12.5 12.6  HCT 37.2 37.7 39.9 37.9 39.1  MCV 94.7 96.2 96.8 95.2 96.8  PLT 218 227 PLATELET CLUMPS NOTED ON SMEAR, COUNT APPEARS ADEQUATE 200 448   Basic Metabolic Panel: Recent Labs  Lab 06/01/19 0208 06/02/19 0659 06/02/19 0913 06/03/19 0828 06/04/19 0805  NA 145 141 141 139 142  K 4.7 5.8* 5.8* 5.1 5.1  CL 109 107 106 103 106  CO2 21* 22 25 22 23   GLUCOSE 96 120* 142* 99 89  BUN 59* 58* 62* 70* 67*  CREATININE 2.04* 2.26* 2.36* 2.32* 2.25*  CALCIUM 9.2 9.2 9.4 9.1 9.4  MG 2.2 2.1 2.2 2.1 2.2  PHOS 5.2* 5.2* 5.2* 5.6* 5.3*   GFR: Estimated Creatinine Clearance: 16 mL/min (A) (by C-G formula based on SCr of 2.25 mg/dL (H)). Liver Function Tests: Recent Labs  Lab 06/01/19 0208 06/02/19 0659 06/02/19 0913 06/03/19 0828 06/04/19 0805  AST 28 36 46* 40 26  ALT 26 26 26 24 24   ALKPHOS 67 65 68 73 75  BILITOT 0.4 0.5 0.9 0.6 0.4  PROT 6.2* 6.3* 6.8 6.5 6.5  ALBUMIN 3.0* 3.0* 3.2* 3.0* 3.1*   No results for input(s): LIPASE, AMYLASE in the last 168 hours. No results for input(s): AMMONIA in the last 168 hours. Coagulation Profile: No results for input(s): INR, PROTIME in the last 168 hours. Cardiac Enzymes: No results for input(s): CKTOTAL, CKMB, CKMBINDEX, TROPONINI in the last 168 hours. BNP (last 3 results) No results for input(s): PROBNP in the last 8760 hours. HbA1C: No results for input(s): HGBA1C in the last 72 hours. CBG: Recent Labs  Lab 06/03/19 2359 06/04/19 0339 06/04/19 0830 06/04/19 1131 06/04/19 1649  GLUCAP 118* 132* 92 138* 94   Lipid Profile: No  results for input(s): CHOL, HDL, LDLCALC, TRIG, CHOLHDL, LDLDIRECT in the last 72 hours. Thyroid Function Tests: No results for input(s): TSH, T4TOTAL, FREET4, T3FREE, THYROIDAB in the last 72 hours. Anemia Panel: No results for input(s): VITAMINB12, FOLATE, FERRITIN, TIBC, IRON, RETICCTPCT in the last 72 hours. Urine analysis:    Component Value Date/Time   COLORURINE YELLOW 05/21/2019 1541   APPEARANCEUR CLEAR 05/21/2019 1541   LABSPEC 1.012 05/21/2019 1541   PHURINE 5.0 05/21/2019 1541   GLUCOSEU NEGATIVE 05/21/2019 1541   HGBUR MODERATE (A) 05/21/2019 1541   BILIRUBINUR NEGATIVE 05/21/2019 [place] 05/21/2019 1541   PROTEINUR  NEGATIVE 05/21/2019 1541   UROBILINOGEN 0.2 04/27/2015 0834   NITRITE NEGATIVE 05/21/2019 1541   LEUKOCYTESUR NEGATIVE 05/21/2019 1541   Sepsis Labs: @LABRCNTIP (procalcitonin:4,lacticidven:4)  ) Recent Results (from the past 240 hour(s))  Culture, blood (routine x 2)     Status: None   Collection Time: 05/28/19  4:30 PM   Specimen: BLOOD  Result Value Ref Range Status   Specimen Description   Final    BLOOD LEFT HAND Performed at [facility] [address], [place], [place] [address]    Special Requests   Final    BOTTLES DRAWN AEROBIC ONLY Blood Culture adequate volume Performed at [facility] [address], [place], [place] [address]    Culture   Final    NO GROWTH 5 DAYS Performed at [facility], [place] [address] [place], [place] [address]    Report Status 06/02/2019 FINAL  Final  Culture, blood (routine x 2)     Status: None   Collection Time: 05/28/19  4:36 PM   Specimen: BLOOD  Result Value Ref Range Status   Specimen Description   Final    BLOOD RIGHT HAND Performed at [facility] [address], [place], [place] [address]    Special Requests   Final    BOTTLES DRAWN AEROBIC ONLY Blood Culture adequate volume Performed at [facility] [address] [place], [place] [address]    Culture   Final    NO GROWTH 5 DAYS Performed at [facility], [place] [address], [place], [place] [address]    Report Status 06/02/2019 FINAL  Final  Culture, Urine     Status: Abnormal   Collection Time: 05/31/19  4:24 AM   Specimen: Urine, Random  Result Value Ref Range Status   Specimen Description   Final    URINE, RANDOM Performed at [facility] [address] [place],  [address]    Special Requests   Final    NONE Performed at [facility], [place] [address], [place],  [address]    Culture (A)  Final    >=100,000 COLONIES/mL ESCHERICHIA COLI Two isolates with different morphologies were identified as the same organism.The most resistant organism was reported. Performed at [facility], [place] [address] [place],  [address]    Report Status 06/04/2019 FINAL  Final   Organism ID, Bacteria ESCHERICHIA COLI (A)  Final      Susceptibility   Escherichia coli - MIC*    AMPICILLIN >=32 RESISTANT Resistant     CEFAZOLIN <=4 SENSITIVE Sensitive     CEFTRIAXONE <=1 SENSITIVE Sensitive     CIPROFLOXACIN <=0.25 SENSITIVE Sensitive     GENTAMICIN <=1 SENSITIVE Sensitive     IMIPENEM <=0.25 SENSITIVE Sensitive     NITROFURANTOIN <=16 SENSITIVE Sensitive     TRIMETH/SULFA <=20 SENSITIVE Sensitive     AMPICILLIN/SULBACTAM 16 INTERMEDIATE Intermediate     Extended ESBL NEGATIVE Sensitive     * >=100,000 COLONIES/mL ESCHERICHIA COLI     Radiology Studies: No results found.  Scheduled Meds: . chlorhexidine  15 mL Mouth Rinse BID  . Chlorhexidine Gluconate Cloth  6 each Topical Daily  . docusate  100 mg Oral BID  . enoxaparin (LOVENOX) injection  50 mg Subcutaneous Q24H  . feeding supplement (ENSURE ENLIVE)  237 mL Oral TID BM  . feeding supplement (OSMOLITE 1.5 CAL)  1,000 mL Per Tube Q24H  . feeding  supplement (PRO-STAT SUGAR FREE 64)  30 mL Per Tube Daily   . free water  300 mL Per Tube Q6H  . Ipratropium-Albuterol  1 puff Inhalation Q6H  . mouth rinse  15 mL Mouth Rinse q12n4p  . metoprolol tartrate  25 mg Per Tube BID  . pantoprazole (PROTONIX) IV  40 mg Intravenous Q24H  . sodium chloride flush  3 mL Intravenous Q12H   Continuous Infusions: . sodium chloride Stopped (05/27/19 0654)  . cefTRIAXone (ROCEPHIN)  IV 2 g (06/04/19 1725)     LOS: 21 days   Time spent: 40 minutes   Berle Mull, MD [facility]  If 7PM-7AM, please contact night-coverage [email or web address] Password [email or web address] 06/04/2019, 6:21 PM

## 2019-06-04 NOTE — Progress Notes (Signed)
Spoke with patient son Chrissie Noa. Update given and questions answered.

## 2019-06-04 NOTE — Progress Notes (Signed)
Physical Therapy Treatment Patient Details Name: Betty [name] MRN: [id number] DOB: [birth year] Today's Date: 06/04/2019    History of Present Illness Pt is a 73 y.o. female admitted 05/13/19 with AMS and weakness; diagnosed with COVID-19 oin 7/9. ETT 7/10-7/13, reintubated 7/13- 7/21; transfer out of ICU 7/22. Returned to ICU 7/23 with worsening encephalopathy. Plan to transfer to floor 7/24. PMH includes CAD, right ruptured cerebral aneurysm,non-small cell lung CA, HTN, COPD, CKD IV, CHF, dementia., PPM, R L L lobectomy., NSTEMI, afib    PT Comments    The patient gets anxious when moving and standing. The patient did stand with 2 assisting at [facility]. Patient tries to sit right back down, Much encouragement. Continue Mobility. Patient is more alert ane interacting. Patient tearful through out.   Follow Up Recommendations  SNF;Supervision/Assistance - 24 hour     Equipment Recommendations       Recommendations for Other Services       Precautions / Restrictions Precautions Precautions: Fall Precaution Comments: coretrac, incontinent    Mobility  Bed Mobility Overal bed mobility: Needs Assistance     Sidelying to sit: Max assist;+2 for safety/equipment       General bed mobility comments: Sat with min A at times, but pt trying to lay back down in bed and Max A to sustain midline positioning  Transfers Overall transfer level: Needs assistance   Transfers: Sit to/from Stand Sit to Stand: Max assist;+2 physical assistance         General transfer comment: Max A. Pt holding bar with hands; initiating stand but unable to achieve full upright posture, leaning forward,  Patient had to stand x 2 to get cleaned up , then transfer to recliner with STEDY  Ambulation/Gait                 Stairs             Wheelchair Mobility    Modified Rankin (Stroke Patients Only)       Balance     Sitting balance-Leahy Scale: Poor       Standing balance-Leahy Scale:  Zero                              Cognition Arousal/Alertness: Awake/alert Behavior During Therapy: Restless;Impulsive Overall Cognitive Status: Impaired/Different from baseline Area of Impairment: Orientation;Attention;Memory;Following commands;Safety/judgement;Awareness;Problem solving                 Orientation Level: Disoriented to;Place;Time;Situation Current Attention Level: Sustained Memory: Decreased short-term memory Following Commands: Follows one step commands inconsistently Safety/Judgement: Decreased awareness of safety;Decreased awareness of deficits Awareness: Intellectual Problem Solving: Slow processing;Decreased initiation;Difficulty sequencing;Requires verbal cues;Requires tactile cues General Comments: Appears delirius; thinks therapists are some chidren that have grown up adn she is visibly upset about it, crying "how did this happen/you all grown up"      Exercises      General Comments        Pertinent Vitals/Pain Pain Assessment: Faces Faces Pain Scale: Hurts little more Pain Location: general discomfort Pain Descriptors / Indicators: Grimacing;Guarding Pain Intervention(s): Monitored during session    Home Living                      Prior Function            PT Goals (current goals can now be found in the care plan section) Acute Rehab PT Goals Patient Stated Goal: none stated  Progress towards PT goals: Progressing toward goals    Frequency    Min 2X/week      PT Plan Current plan remains appropriate    Co-evaluation PT/OT/SLP Co-Evaluation/Treatment: Yes Reason for Co-Treatment: For patient/therapist safety PT goals addressed during session: Mobility/safety with mobility OT goals addressed during session: ADL's and self-care      AM-PAC PT "6 Clicks" Mobility   Outcome Measure  Help needed turning from your back to your side while in a flat bed without using bedrails?: Total Help needed moving  from lying on your back to sitting on the side of a flat bed without using bedrails?: Total Help needed moving to and from a bed to a chair (including a wheelchair)?: Total Help needed standing up from a chair using your arms (e.g., wheelchair or bedside chair)?: Total Help needed to walk in hospital room?: Total Help needed climbing 3-5 steps with a railing? : Total 6 Click Score: 6    End of Session Equipment Utilized During Treatment: Gait belt Activity Tolerance: Patient tolerated treatment well Patient left: in chair;with call bell/phone within reach Nurse Communication: Mobility status;Need for lift equipment PT Visit Diagnosis: Other abnormalities of gait and mobility (R26.89);Muscle weakness (generalized) (M62.81)     Time: 5102-5852 PT Time Calculation (min) (ACUTE ONLY): 26 min  Charges:  $Therapeutic Activity: 8-22 mins                     Tresa Endo PT Acute Rehabilitation Services Office [phone]    [name] 06/04/2019, 2:20 PM

## 2019-06-04 NOTE — Progress Notes (Signed)
Occupational Therapy Treatment Patient Details Name: Betty [name] MRN: [id number] DOB: [birth year] Today's Date: 06/04/2019    History of present illness Pt is a 73 y.o. female admitted 05/13/19 with AMS and weakness; diagnosed with COVID-19 oin 7/9. ETT 7/10-7/13, reintubated 7/13- 7/21; transfer out of ICU 7/22. Returned to ICU 7/23 with worsening encephalopathy. Plan to transfer to floor 7/24. PMH includes CAD, right ruptured cerebral aneurysm,non-small cell lung CA, HTN, COPD, CKD IV, CHF, dementia., PPM, R L L lobectomy., NSTEMI, afib   OT comments  Pt appears delirious, thinking the therapists are children she knows who have grown up. Progressed to EOB with Max A then used Stedy with +2 Max A to mobilize to chair. Staff will need to use Maximove. Pt ableto engage in grooming task today. Listening to christian music and singing lyrics at times. VSS. Making progress. Remains appropriate for rehab @ SNF.  Follow Up Recommendations  SNF;Supervision/Assistance - 24 hour    Equipment Recommendations  Other (comment)    Recommendations for Other Services      Precautions / Restrictions Precautions Precautions: Fall Restrictions Weight Bearing Restrictions: No       Mobility Bed Mobility Overal bed mobility: Needs Assistance     Sidelying to sit: Max assist;+2 for safety/equipment       General bed mobility comments: Sat with min A at times, but pt trying to lie back down n bed adn Max A to sustain midline positioning  Transfers Overall transfer level: Needs assistance   Transfers: Sit to/from Stand Sit to Stand: Max assist;+2 physical assistance         General transfer comment: Max A. Pt holding bar with hands; initiating stand but unableto achieve fulol upright posture    Balance     Sitting balance-Leahy Scale: Poor       Standing balance-Leahy Scale: Zero                             ADL either performed or assessed with clinical judgement   ADL  Overall ADL's : Needs assistance/impaired Eating/Feeding: Moderate assistance Eating/Feeding Details (indicate cue type and reason): able to bring thickened tea to mouth and drink; limited intake due to not liking the taste Grooming: Moderate assistance;Sitting;Oral care Grooming Details (indicate cue type and reason): able to sustani attentuion briefly to brush teeth using R hand after helping pt put toothbrush in mouth. showiing increased awareness by saying she needed toothpaste    Incontinent of BM. NT assisted with cleaning while PT/OT stood pt. Pt unaware                         Functional mobility during ADLs: Maximal assistance;+2 for physical assistance;Cueing for sequencing;Cueing for safety       Vision       Perception     Praxis      Cognition Arousal/Alertness: Awake/alert Behavior During Therapy: Restless;Impulsive Overall Cognitive Status: Impaired/Different from baseline Area of Impairment: Orientation;Attention;Memory;Following commands;Safety/judgement;Awareness;Problem solving                 Orientation Level: Disoriented to;Place;Time;Situation Current Attention Level: Sustained Memory: Decreased short-term memory Following Commands: Follows one step commands inconsistently Safety/Judgement: Decreased awareness of safety;Decreased awareness of deficits Awareness: Intellectual Problem Solving: Slow processing;Decreased initiation;Difficulty sequencing;Requires verbal cues;Requires tactile cues General Comments: Appears delirius; thinks therapists are some chidren that have grown up adn she is visibly upset about it, crying "how  did this happen/you all grown up"        Exercises     Shoulder Instructions       General Comments      Pertinent Vitals/ Pain       Pain Assessment: Faces Faces Pain Scale: Hurts little more Pain Location: general discomfort Pain Descriptors / Indicators: Grimacing;Guarding Pain Intervention(s): Limited  activity within patient's tolerance  Home Living                                          Prior Functioning/Environment              Frequency  Min 2X/week        Progress Toward Goals  OT Goals(current goals can now be found in the care plan section)  Progress towards OT goals: Progressing toward goals  Acute Rehab OT Goals Patient Stated Goal: none stated OT Goal Formulation: Patient unable to participate in goal setting Time For Goal Achievement: 06/12/19 Potential to Achieve Goals: Fair ADL Goals Pt Will Perform Grooming: with min guard assist;sitting Pt Will Perform Upper Body Bathing: with min assist;sitting Pt Will Perform Upper Body Dressing: with min assist;sitting Pt Will Transfer to Toilet: with mod assist;stand pivot transfer;bedside commode Pt Will Perform Toileting - Clothing Manipulation and hygiene: with mod assist;sit to/from stand Pt/caregiver will Perform Home Exercise Program: Increased ROM;Increased strength;Both right and left upper extremity;With minimal assist;With written HEP provided Additional ADL Goal #1: Pt will follow 1 step commands with 75% accuracy during functional tasks. Additional ADL Goal #2: Pt will sustain attention to functional task >5 min without cues.  Plan Discharge plan remains appropriate    Co-evaluation    PT/OT/SLP Co-Evaluation/Treatment: Yes Reason for Co-Treatment: Complexity of the patient's impairments (multi-system involvement);Necessary to address cognition/behavior during functional activity;To address functional/ADL transfers;For patient/therapist safety   OT goals addressed during session: ADL's and self-care;Strengthening/ROM      AM-PAC OT "6 Clicks" Daily Activity     Outcome Measure   Help from another person eating meals?: A Lot Help from another person taking care of personal grooming?: A Lot Help from another person toileting, which includes using toliet, bedpan, or urinal?:  Total Help from another person bathing (including washing, rinsing, drying)?: Total Help from another person to put on and taking off regular upper body clothing?: Total Help from another person to put on and taking off regular lower body clothing?: Total 6 Click Score: 8    End of Session Equipment Utilized During Treatment: Oxygen;Gait belt  OT Visit Diagnosis: Muscle weakness (generalized) (M62.81);Other symptoms and signs involving cognitive function;Unsteadiness on feet (R26.81)   Activity Tolerance     Patient Left in chair;with call bell/phone within reach;with chair alarm set   Nurse Communication Mobility status;Need for lift equipment(Maximove)        Time: 8372-9021 OT Time Calculation (min): 41 min  Charges: OT General Charges $OT Visit: 1 Visit OT Treatments $Self Care/Home Management : 23-37 mins  Maurie Boettcher, OT/L   Acute OT Clinical Specialist [place] Pager [phone] Office [phone]    [facility] 06/04/2019, 12:16 PM

## 2019-06-04 NOTE — Progress Notes (Signed)
ANTICOAGULATION CONSULT NOTE - Follow Up Consult  Pharmacy Consult for Lovenox Indication: atrial fibrillation  Allergies  Allergen Reactions  . Sulfa Antibiotics Itching    Patient Measurements: Height: 5\' 4"  (162.6 cm) Weight: 102 lb 1.2 oz (46.3 kg) IBW/kg (Calculated) : 54.7  Vital Signs: Temp: 97.6 F (36.4 C) (07/30 0343) Temp Source: Oral (07/30 0343) BP: 127/84 (07/30 0800) Pulse Rate: 99 (07/30 0800)  Labs: Recent Labs    06/01/19 1715 06/02/19 0659 06/02/19 0913 06/03/19 0828  HGB  --  12.7  --  12.5  HCT  --  39.9  --  37.9  PLT  --  PLATELET CLUMPS NOTED ON SMEAR, COUNT APPEARS ADEQUATE  --  200  HEPRLOWMOCWT 0.91  --   --   --   CREATININE  --  2.26* 2.36* 2.32*    Estimated Creatinine Clearance: 15.5 mL/min (A) (by C-G formula based on SCr of 2.32 mg/dL (H)).   Assessment: 73 y.o. female admitted on 05/13/2019 with COVID-19 pneumonia, requiring intubation.  Patient developed atrial fibrillation (CHA2DS2/VAS= 5), started on amiodarone which was stopped, was being AV paced. Has been on VTE prophylaxis with SQ heparin, then developed GI bleed on 7/17 with Hgb decreased 7.5 for which she was transfused.  No evidencey of further bleeding and Hgb better so full-dose Lovenox started for afib on 7/19, dose was held on 7/23, but resumed on 7/24.   FOB negative on 05/31/19.  Long term plan for anticoagulation is ASA rather than full anticoagulation. We will cont the current dose while she is here since the level was appropriate.   Goal of Therapy:  Anti-Xa level 0.6-1 units/ml (4 hours post dose) Monitor platelets by anticoagulation protocol: Yes   Plan:  - Lovenox to 50 mg daily  - CBC at least q 72h.  Monitor for bleeding   Onnie Boer, PharmD, BCIDP, AAHIVP, CPP Infectious Disease Pharmacist 06/04/2019 8:55 AM

## 2019-06-04 NOTE — Plan of Care (Signed)
POC reviewed with off going staff and patient

## 2019-06-04 NOTE — Progress Notes (Signed)
Report received from [place], dayshift RN during BSR. POC reviewed with patient. Tube feeding running at 64ml/hr, patient denies pain, SOB and nausea. Bed alarm is set, patient repositioned in bed

## 2019-06-04 NOTE — Plan of Care (Signed)
POC reviewed

## 2019-06-05 LAB — COMPREHENSIVE METABOLIC PANEL
ALT: 21 U/L (ref 0–44)
AST: 23 U/L (ref 15–41)
Albumin: 3.1 g/dL — ABNORMAL LOW (ref 3.5–5.0)
Alkaline Phosphatase: 74 U/L (ref 38–126)
Anion gap: 11 (ref 5–15)
BUN: 67 mg/dL — ABNORMAL HIGH (ref 8–23)
CO2: 24 mmol/L (ref 22–32)
Calcium: 9.6 mg/dL (ref 8.9–10.3)
Chloride: 107 mmol/L (ref 98–111)
Creatinine, Ser: 2.42 mg/dL — ABNORMAL HIGH (ref 0.44–1.00)
GFR calc Af Amer: 22 mL/min — ABNORMAL LOW (ref >60)
GFR calc non Af Amer: 19 mL/min — ABNORMAL LOW (ref >60)
Glucose, Bld: 150 mg/dL — ABNORMAL HIGH (ref 70–99)
Potassium: 4.9 mmol/L (ref 3.5–5.1)
Sodium: 142 mmol/L (ref 135–145)
Total Bilirubin: 0.6 mg/dL (ref 0.3–1.2)
Total Protein: 6.6 g/dL (ref 6.5–8.1)

## 2019-06-05 LAB — CBC WITH DIFFERENTIAL/PLATELET
Abs Immature Granulocytes: 0.04 10*3/uL (ref 0.00–0.07)
Basophils Absolute: 0 10*3/uL (ref 0.0–0.1)
Basophils Relative: 0 %
Eosinophils Absolute: 0.1 10*3/uL (ref 0.0–0.5)
Eosinophils Relative: 1 %
HCT: 40 % (ref 36.0–46.0)
Hemoglobin: 13 g/dL (ref 12.0–15.0)
Immature Granulocytes: 0 %
Lymphocytes Relative: 21 %
Lymphs Abs: 2.1 10*3/uL (ref 0.7–4.0)
MCH: 31.3 pg (ref 26.0–34.0)
MCHC: 32.5 g/dL (ref 30.0–36.0)
MCV: 96.2 fL (ref 80.0–100.0)
Monocytes Absolute: 0.8 10*3/uL (ref 0.1–1.0)
Monocytes Relative: 8 %
Neutro Abs: 6.6 10*3/uL (ref 1.7–7.7)
Neutrophils Relative %: 70 %
Platelets: 254 10*3/uL (ref 150–400)
RBC: 4.16 MIL/uL (ref 3.87–5.11)
RDW: 17.1 % — ABNORMAL HIGH (ref 11.5–15.5)
WBC: 9.6 10*3/uL (ref 4.0–10.5)
nRBC: 0 % (ref 0.0–0.2)

## 2019-06-05 LAB — GLUCOSE, CAPILLARY
Glucose-Capillary: 104 mg/dL — ABNORMAL HIGH (ref 70–99)
Glucose-Capillary: 154 mg/dL — ABNORMAL HIGH (ref 70–99)
Glucose-Capillary: 141 mg/dL — ABNORMAL HIGH (ref 70–99)
Glucose-Capillary: 118 mg/dL — ABNORMAL HIGH (ref 70–99)

## 2019-06-05 LAB — MAGNESIUM: Magnesium: 2.1 mg/dL (ref 1.7–2.4)

## 2019-06-05 LAB — PHOSPHORUS: Phosphorus: 5.2 mg/dL — ABNORMAL HIGH (ref 2.5–4.6)

## 2019-06-05 MED ORDER — LACTATED RINGERS IV SOLN
INTRAVENOUS | Status: DC
  Administered 2019-06-05 – 2019-06-06 (×2): via INTRAVENOUS

## 2019-06-05 NOTE — Progress Notes (Signed)
  Speech Language Pathology Treatment: Dysphagia  Patient Details Name: Betty [name] MRN: [id number] DOB: 03-27-1946 Today's Date: 06/05/2019 Time: 0900-0930 SLP Time Calculation (min) (ACUTE ONLY): 30 min  Assessment / Plan / Recommendation Clinical Impression  Pt accepted a few bits of grits and eggs and potatoes. Needed frequent redirections from crying about her adopted mother who she believes passed away yesterday. Somewhat responsive to reorientation. Able to verbalize need for toileting, followed commands with max assist to get onto bed pan. Pt requested sips of ice water, which was given with equal presentation to nectar. Throat clearing still occurs but is secondary to NG tube irritation and not PO as it occurs fairly consistently without POs as well. Will advance diet to mech soft with thin liquids to increase pts satisfaction with PO.   HPI HPI: Betty [name] is a 73 y.o. female admitted 7/9 with medical history significant of tobacco dependence; COPD, CAD; non-small cell lung CA (2016); HTN; HLD: dementia; COPD; stage IV CKD; and chronic combined CHF presenting with AMS. Intubated 7/10-7/13 and reintubated 7/13-7/21.      SLP Plan  Continue with current plan of care       Recommendations  Diet recommendations: Dysphagia 3 (mechanical soft);Thin liquid Liquids provided via: Cup;Straw Medication Administration: Whole meds with puree Supervision: Full supervision/cueing for compensatory strategies Compensations: Slow rate;Small sips/bites Postural Changes and/or Swallow Maneuvers: Seated upright 90 degrees                Oral Care Recommendations: Oral care BID Follow up Recommendations: Skilled Nursing facility SLP Visit Diagnosis: Dysphagia, unspecified (R13.10) Plan: Continue with current plan of care       GO               Herbie [place], MA [employer] Pager [phone] Office [phone]  Lynann Beaver 06/05/2019,  11:15 AM

## 2019-06-05 NOTE — Progress Notes (Signed)
Report received from [place], day shift RN. POC reveiwed. Pt has had another loose BM, she has been washed, linens changed and all electrodes replaced. Tube feeding rate 58ml/hr

## 2019-06-05 NOTE — Progress Notes (Signed)
 Nutrition Follow-up   RD working remotely.   DOCUMENTATION CODES:   Not applicable  INTERVENTION:   Feeding assistance and encouragement at all meals  Continue Ensure Enlive po TID, each supplement provides 350 kcal and 20 grams of protein  Pt receiving Hormel Shake daily with Breakfast which provides 520 kcals and 22 g of protein and Magic cup BID with lunch and dinner, each supplement provides 290 kcal and 9 grams of protein, automatically on meal trays to optimize nutritional intake.   Tube Feeding: Continue Nocturnal TF from 1800 to 0800 daily ( TF to infuse over 14 hours daily) Osmolite 1.5 at 65 ml/hr over 14 hours (1800 to 0800 daily) This provides 1365 kcals, 58 g of protein and  692 mL of free water Meets 80% calorie needs  Continue Pro-Stat 30 mL daily per Cortrak tube Additional 100 kcals, 15 g of protein   NUTRITION DIAGNOSIS:   Inadequate oral intake related to acute illness as evidenced by NPO status.  Being addressed via TF   GOAL:   Patient will meet greater than or equal to 90% of their needs  Progressing  MONITOR:   Vent status, TF tolerance, Labs, Weight trends, Skin  REASON FOR ASSESSMENT:   Consult, Ventilator Enteral/tube feeding initiation and management  ASSESSMENT:   73 yo female admitted with severe acute respiratory failure from COVID-19 pneumonia requiring intubation, AKI/CKD IV.  PMH includes COPD, CKD IV, CHF, dementia, HTN, HLD, non-small cell lung ca in 2016, CAD.   RD working remotely.  Recorded po intake 10-40% of meals yesterday  Per chart review, pt crying and needing frequent re-direction. Pt took a few bites of eggs, grits and potatoes this AM. Diet upgraded to Dysphagia III with Thin Liquids Detailed calorie count not being completed  Per RN, pt not eating well. Unsure if pt taking Ensure or [employer]. It appears pt may be taking some Ensure (per Mar pt receiving) but unsure how much  Pt supposed to be receiving  nocturnal feedings only from 1800 to 0800 daily 14 hours per day. Unclear if this is happening or if pt has been receiving TF 24 hours a day still. Reviewed TF orders and clarified with RN  Labs: phosphorus 5.2, Creatinine 2.42, BUN 67, CBGs 92-138 Meds: reviewed   Diet Order:   Diet Order            DIET DYS 3 Room service appropriate? Yes; Fluid consistency: Thin  Diet effective now              EDUCATION NEEDS:   Not appropriate for education at this time  Skin:  Skin Assessment: Skin Integrity Issues: Skin Integrity Issues:: Stage II Stage II: coccyx  Last BM:  7/31  Height:   Ht Readings from Last 1 Encounters:  05/18/19 5\' 4"  (1.626 m)    Weight:   Wt Readings from Last 1 Encounters:  06/04/19 46.3 kg    Ideal Body Weight:  54.5 kg  BMI:  Body mass index is 17.52 kg/m.  Estimated Nutritional Needs:   Kcal:  1650-1850 kcals  Protein:  83-93 g  Fluid:  >/= 1.7 L    Baraa Tubbs MS, RDN, LDN, CNSC [phone] Pager  [phone] Weekend/On-Call Pager

## 2019-06-05 NOTE — Progress Notes (Signed)
PROGRESS NOTE    Betty [name]  DGL:[id number] DOB: Aug 11, 1946 DOA: 05/13/2019 PCP: Josetta Huddle, MD   Brief Narrative:  73 year old BF PMHx dementia, CAD, NSCLC in 2016, HTN, HLD COPD, kidney stage IV, hronic kidney disease stage IV, chronic systolic and diastolic CHF. Pt brought to the hospital by the patient's son due to increased confusion and weakness. She was diagnosed with coronavirus in the ED on 7/9. She was also found to be hypoxic requiring 2 L nasal cannula. She was admitted to the hospital and placed on steroids along with Remdesivir, deferred to ICU on 7/10, where she required intubation, she was extubated on 7/13, where she was reintubated that same day due to encephalopathy, was extubated 7/21   Subjective:  Patient in bed on room air and comfortable, denies any headache chest or abdominal pain.  Says she is sad because her sister passed away.   Assessment & Plan:    Acute respiratory failure with hypoxia/COVID 19 pneumonia -  Completed course of Steroids, Remdesivir & Actemra -Intubated 7/10, extubated 7/13 with subsequent reintubation that same day: successfully extubated 7/21.  Currently appears stable on room air and recovering, advance activity, initiate placement arrangements.  COVID-19 Labs  Recent Labs    06/03/19 0828  DDIMER 1.78*  CRP <0.8    Lab Results  Component Value Date   SARSCOV2NAA POSITIVE (A) 51/88/4166    Acute metabolic encephalopathy, likely in the setting of sepsis with UTI,  Likely POA septic shock ruled out -Blood cultures positive for pansensitive E. coli on 726, currently on Rocephin continue, will target total of 7 days of treatment as she has defervesced, if discharge is arranged she can be switched to oral Keflex.   Chronic systolic and diastolic CHF -EF 30 to 06%, not in acute exacerbation -Strict in and out/Daily weight Filed Weights   06/02/19 0330 06/03/19 0500 06/04/19 0343  Weight: 57.1 kg 54.5 kg 46.3 kg  - June 2020  echocardiogram EF 30 to 35%.    Not on ACE/ARB due to renal dysfunction, continue beta-blocker.  Newly diagnosed/new onset: Paroxysmal A. fib with RVR, rate controlled - Continue home metoprolol - Briefly on amiodarone drip but now discontinued. - On Lovenox - Patient is likely high risk for falls, recent GI bleed, advanced age and not a candidate for anticoagulation. Recommend full dose aspirin at this point - can follow with PCP for further evaluation and treatment.  Lower GI bleed, resolved - During his hospitalization patient noted to have bloody stools, gastric lavage did not show any blood in the stomach - not a candidate for endoscopy per previous documentation. - status post 1 u PRBC previously - Currently blood noticed around Flexi-Seal and watery fluid within her gastric bag possibly positive for blood. - 7/26 fecal occult blood negative  - H/H stable  Symptomatic Hypernatremia, resolving Recent Labs  Lab 06/02/19 0659 06/02/19 0913 06/03/19 0828 06/04/19 0805 06/05/19 0156  NA 141 141 139 142 142  -Continue free water 300 ml every 6 hours      Acute on CKD stage IV (baseline Cr 2- 3) Recent Labs  Lab 05/30/19 0538 05/31/19 0404 05/31/19 0815 06/01/19 3016 06/02/19 0109 06/02/19 0913 06/03/19 0828 06/04/19 0805 06/05/19 0156  CREATININE 1.84* 1.90* 1.92* 2.04* 2.26* 2.36* 2.32* 2.25* 2.42*  - Clubbing mild AKI gently hydrate on 06/05/2019 and monitor  Prediabetes vs hyperglycemia - 7/1 hemoglobin A1c= 6.9 -Prior EMR has had multiple episodes of steroids in the past 1 to 2 months for  acute gout as well as COVID.  Patient now off steroids - Levemir 10 units daily - Resistant SSI  Dementia - Discussed case with PCCM staff patient's baseline is; able to care for herself independently  Hx NSCLC/COPD -S/p RLL lobectomy + 4 cycles of adjuvant chemotherapy -Lung cancer appears to be in remission per EMR   Hx gout  -Per EMR last 1 to 39-month treated for  acute gout with steroids.  Does not take steroids on a regular basis -7/27 uric acid= 6.1  Nutrition -7/27 requested speech reevaluate patient's dysphagia.  Speech recommends dysphagia 2  nectar thick liquid -7/28 speech is cleared patient for diet however patient's caloric intake extremely poor right now do not believe she is meeting her requirements through p.o. intake. - 7/29 initiate night tube feedings until patient can tolerate PO adequately enough to maintain caloric intake -If oral intake is adequate during the day can consider discontinuing the core track on 06/05/2019.    DVT prophylaxis: Lovenox Code Status: DNR Family Communication: None.   Disposition Plan: Likely to return to SNF pending clinical improvement  Consultants:  PCCM  Procedures/Significant Events:  6/17 echocardiogram:Left Ventricle: The left ventricle has moderate-severely reduced systolic function. -EF 30-35%.  -Left ventricular diffuse hypokinesis.  I have personally reviewed and interpreted all radiology studies and my findings are as above.  Cultures  7/13 MRSA by PCR negative 7/17 tracheal aspirate normal respiratory flora 7/23 blood LEFT hand negative 7/23 blood RIGHT hand negative 7/26 urine positive gram-negative rods  Antibiotics Given (last 72 hours)    Date/Time Action Medication Dose Rate   06/02/19 1818 New Bag/Given   cefTRIAXone (ROCEPHIN) 2 g in sodium chloride 0.9 % 100 mL IVPB 2 g 200 mL/hr   06/03/19 1818 New Bag/Given   cefTRIAXone (ROCEPHIN) 2 g in sodium chloride 0.9 % 100 mL IVPB 2 g 200 mL/hr   06/04/19 1725 New Bag/Given   cefTRIAXone (ROCEPHIN) 2 g in sodium chloride 0.9 % 100 mL IVPB 2 g 200 mL/hr      Objective: Vitals:   06/05/19 0000 06/05/19 0320 06/05/19 0700 06/05/19 0848  BP: 135/76 136/82  133/68  Pulse:  (!) 101 (!) 45 (!) 104  Resp:  (!) 23 16 (!) 25  Temp: (!) 96.9 F (36.1 C) 97.9 F (36.6 C)  (!) 97.5 F (36.4 C)  TempSrc: Axillary Oral  Axillary   SpO2:  100% 98% 100%  Weight:      Height:        Intake/Output Summary (Last 24 hours) at 06/05/2019 1017 Last data filed at 06/04/2019 1740 Gross per 24 hour  Intake 929.75 ml  Output -  Net 929.75 ml   Filed Weights   06/02/19 0330 06/03/19 0500 06/04/19 0343  Weight: 57.1 kg 54.5 kg 46.3 kg   .Physical Exam:  Awake Alert, Oriented X 1, No new F.N deficits, NG in place [place].AT,PERRAL Supple Neck,No JVD, No cervical lymphadenopathy appriciated.  Symmetrical Chest wall movement, Good air movement bilaterally, CTAB RRR,No Gallops, Rubs or new Murmurs, No Parasternal Heave +ve B.Sounds, Abd Soft, No tenderness, No organomegaly appriciated, No rebound - guarding or rigidity. No Cyanosis, Clubbing or edema, No new Rash or bruise    Data Reviewed: Care during the described time interval was provided by me .  I have reviewed this patient's available data, including medical history, events of note, physical examination, and all test results as part of my evaluation.   CBC: Recent Labs  Lab 06/01/19 [phone] 06/02/19 [phone]  06/03/19 0828 06/04/19 0805 06/05/19 0156  WBC 12.8* 9.9 11.1* 10.3 9.6  NEUTROABS 10.4* 6.9 8.0* 7.3 6.6  HGB 12.3 12.7 12.5 12.6 13.0  HCT 37.7 39.9 37.9 39.1 40.0  MCV 96.2 96.8 95.2 96.8 96.2  PLT 227 PLATELET CLUMPS NOTED ON SMEAR, COUNT APPEARS ADEQUATE 200 249 474   Basic Metabolic Panel: Recent Labs  Lab 06/02/19 0659 06/02/19 0913 06/03/19 0828 06/04/19 0805 06/05/19 0156  NA 141 141 139 142 142  K 5.8* 5.8* 5.1 5.1 4.9  CL 107 106 103 106 107  CO2 22 25 22 23 24   GLUCOSE 120* 142* 99 89 150*  BUN 58* 62* 70* 67* 67*  CREATININE 2.26* 2.36* 2.32* 2.25* 2.42*  CALCIUM 9.2 9.4 9.1 9.4 9.6  MG 2.1 2.2 2.1 2.2 2.1  PHOS 5.2* 5.2* 5.6* 5.3* 5.2*   GFR: Estimated Creatinine Clearance: 14.9 mL/min (A) (by C-G formula based on SCr of 2.42 mg/dL (H)). Liver Function Tests: Recent Labs  Lab 06/02/19 0659 06/02/19 0913 06/03/19 0828 06/04/19  0805 06/05/19 0156  AST 36 46* 40 26 23  ALT 26 26 24 24 21   ALKPHOS 65 68 73 75 74  BILITOT 0.5 0.9 0.6 0.4 0.6  PROT 6.3* 6.8 6.5 6.5 6.6  ALBUMIN 3.0* 3.2* 3.0* 3.1* 3.1*   No results for input(s): LIPASE, AMYLASE in the last 168 hours. No results for input(s): AMMONIA in the last 168 hours. Coagulation Profile: No results for input(s): INR, PROTIME in the last 168 hours. Cardiac Enzymes: No results for input(s): CKTOTAL, CKMB, CKMBINDEX, TROPONINI in the last 168 hours. BNP (last 3 results) No results for input(s): PROBNP in the last 8760 hours. HbA1C: No results for input(s): HGBA1C in the last 72 hours. CBG: Recent Labs  Lab 06/04/19 0339 06/04/19 0830 06/04/19 1131 06/04/19 1649 06/04/19 2105  GLUCAP 132* 92 138* 94 138*   Lipid Profile: No results for input(s): CHOL, HDL, LDLCALC, TRIG, CHOLHDL, LDLDIRECT in the last 72 hours. Thyroid Function Tests: No results for input(s): TSH, T4TOTAL, FREET4, T3FREE, THYROIDAB in the last 72 hours. Anemia Panel: No results for input(s): VITAMINB12, FOLATE, FERRITIN, TIBC, IRON, RETICCTPCT in the last 72 hours. Urine analysis:    Component Value Date/Time   COLORURINE YELLOW 05/21/2019 1541   APPEARANCEUR CLEAR 05/21/2019 1541   LABSPEC 1.012 05/21/2019 1541   PHURINE 5.0 05/21/2019 1541   GLUCOSEU NEGATIVE 05/21/2019 1541   HGBUR MODERATE (A) 05/21/2019 1541   BILIRUBINUR NEGATIVE 05/21/2019 1541   KETONESUR NEGATIVE 05/21/2019 1541   PROTEINUR NEGATIVE 05/21/2019 1541   UROBILINOGEN 0.2 04/27/2015 0834   NITRITE NEGATIVE 05/21/2019 1541   LEUKOCYTESUR NEGATIVE 05/21/2019 1541   Sepsis Labs: @LABRCNTIP (procalcitonin:4,lacticidven:4)  ) Recent Results (from the past 240 hour(s))  Culture, blood (routine x 2)     Status: None   Collection Time: 05/28/19  4:30 PM   Specimen: BLOOD  Result Value Ref Range Status   Specimen Description   Final    BLOOD LEFT HAND Performed at [facility], [place]  [address] [place], [place] [address]    Special Requests   Final    BOTTLES DRAWN AEROBIC ONLY Blood Culture adequate volume Performed at [facility] [address], [place], [place] [address]    Culture   Final    NO GROWTH 5 DAYS Performed at [facility], [place] [address] [place], [place] [address]    Report Status 06/02/2019 FINAL  Final  Culture, blood (routine x 2)  Status: None   Collection Time: 05/28/19  4:36 PM   Specimen: BLOOD  Result Value Ref Range Status   Specimen Description   Final    BLOOD RIGHT HAND Performed at [facility] [address] [place], [place] [address]    Special Requests   Final    BOTTLES DRAWN AEROBIC ONLY Blood Culture adequate volume Performed at [facility] [address], [place], [place] [address]    Culture   Final    NO GROWTH 5 DAYS Performed at [facility], [place] [address] [place], [place] [address]    Report Status 06/02/2019 FINAL  Final  Culture, Urine     Status: Abnormal   Collection Time: 05/31/19  4:24 AM   Specimen: Urine, Random  Result Value Ref Range Status   Specimen Description   Final    URINE, RANDOM Performed at [facility] [address], [place], [place] [address]    Special Requests   Final    NONE Performed at [facility], [place] [address] [place], [place] [address]    Culture (A)  Final    >=100,000 COLONIES/mL ESCHERICHIA COLI Two isolates with different morphologies were identified as the same organism.The most resistant organism was reported. Performed at [facility], [place] [address] [place],  [address]    Report Status 06/04/2019 FINAL  Final   Organism ID, Bacteria ESCHERICHIA COLI (A)  Final      Susceptibility   Escherichia coli - MIC*    AMPICILLIN >=32 RESISTANT Resistant     CEFAZOLIN <=4 SENSITIVE Sensitive     CEFTRIAXONE <=1 SENSITIVE Sensitive      CIPROFLOXACIN <=0.25 SENSITIVE Sensitive     GENTAMICIN <=1 SENSITIVE Sensitive     IMIPENEM <=0.25 SENSITIVE Sensitive     NITROFURANTOIN <=16 SENSITIVE Sensitive     TRIMETH/SULFA <=20 SENSITIVE Sensitive     AMPICILLIN/SULBACTAM 16 INTERMEDIATE Intermediate     Extended ESBL NEGATIVE Sensitive     * >=100,000 COLONIES/mL ESCHERICHIA COLI     Radiology Studies: No results found.  Scheduled Meds: . chlorhexidine  15 mL Mouth Rinse BID  . Chlorhexidine Gluconate Cloth  6 each Topical Daily  . docusate  100 mg Oral BID  . enoxaparin (LOVENOX) injection  50 mg Subcutaneous Q24H  . feeding supplement (ENSURE ENLIVE)  237 mL Oral TID BM  . feeding supplement (OSMOLITE 1.5 CAL)  1,000 mL Per Tube Q24H  . feeding supplement (PRO-STAT SUGAR FREE 64)  30 mL Per Tube Daily  . free water  300 mL Per Tube Q6H  . Ipratropium-Albuterol  1 puff Inhalation Q6H  . mouth rinse  15 mL Mouth Rinse q12n4p  . metoprolol tartrate  25 mg Per Tube BID  . pantoprazole (PROTONIX) IV  40 mg Intravenous Q24H  . sodium chloride flush  3 mL Intravenous Q12H   Continuous Infusions: . sodium chloride Stopped (05/27/19 0654)  . cefTRIAXone (ROCEPHIN)  IV 2 g (06/04/19 1725)     LOS: 22 days   Time spent: 40 minutes  Signature  Lala Lund M.D on 06/05/2019 at 10:18 AM   -  To page go to [email or web address]

## 2019-06-05 NOTE — Plan of Care (Signed)
POC reviewed at the bedside

## 2019-06-06 LAB — CBC WITH DIFFERENTIAL/PLATELET
Abs Immature Granulocytes: 0.03 10*3/uL (ref 0.00–0.07)
Basophils Absolute: 0 10*3/uL (ref 0.0–0.1)
Basophils Relative: 0 %
Eosinophils Absolute: 0.1 10*3/uL (ref 0.0–0.5)
Eosinophils Relative: 1 %
HCT: 38.4 % (ref 36.0–46.0)
Hemoglobin: 12.3 g/dL (ref 12.0–15.0)
Immature Granulocytes: 0 %
Lymphocytes Relative: 20 %
Lymphs Abs: 1.9 10*3/uL (ref 0.7–4.0)
MCH: 31.1 pg (ref 26.0–34.0)
MCHC: 32 g/dL (ref 30.0–36.0)
MCV: 97.2 fL (ref 80.0–100.0)
Monocytes Absolute: 0.8 10*3/uL (ref 0.1–1.0)
Monocytes Relative: 9 %
Neutro Abs: 6.6 10*3/uL (ref 1.7–7.7)
Neutrophils Relative %: 70 %
Platelets: 239 10*3/uL (ref 150–400)
RBC: 3.95 MIL/uL (ref 3.87–5.11)
RDW: 17 % — ABNORMAL HIGH (ref 11.5–15.5)
WBC: 9.5 10*3/uL (ref 4.0–10.5)
nRBC: 0 % (ref 0.0–0.2)

## 2019-06-06 LAB — MAGNESIUM: Magnesium: 2 mg/dL (ref 1.7–2.4)

## 2019-06-06 LAB — COMPREHENSIVE METABOLIC PANEL
ALT: 21 U/L (ref 0–44)
AST: 25 U/L (ref 15–41)
Albumin: 2.9 g/dL — ABNORMAL LOW (ref 3.5–5.0)
Alkaline Phosphatase: 67 U/L (ref 38–126)
Anion gap: 13 (ref 5–15)
BUN: 57 mg/dL — ABNORMAL HIGH (ref 8–23)
CO2: 21 mmol/L — ABNORMAL LOW (ref 22–32)
Calcium: 9.1 mg/dL (ref 8.9–10.3)
Chloride: 107 mmol/L (ref 98–111)
Creatinine, Ser: 2.09 mg/dL — ABNORMAL HIGH (ref 0.44–1.00)
GFR calc Af Amer: 26 mL/min — ABNORMAL LOW (ref >60)
GFR calc non Af Amer: 23 mL/min — ABNORMAL LOW (ref >60)
Glucose, Bld: 118 mg/dL — ABNORMAL HIGH (ref 70–99)
Potassium: 5 mmol/L (ref 3.5–5.1)
Sodium: 141 mmol/L (ref 135–145)
Total Bilirubin: 0.5 mg/dL (ref 0.3–1.2)
Total Protein: 6.2 g/dL — ABNORMAL LOW (ref 6.5–8.1)

## 2019-06-06 LAB — BRAIN NATRIURETIC PEPTIDE: B Natriuretic Peptide: 93.1 pg/mL (ref 0.0–100.0)

## 2019-06-06 LAB — GLUCOSE, CAPILLARY
Glucose-Capillary: 148 mg/dL — ABNORMAL HIGH (ref 70–99)
Glucose-Capillary: 102 mg/dL — ABNORMAL HIGH (ref 70–99)
Glucose-Capillary: 157 mg/dL — ABNORMAL HIGH (ref 70–99)
Glucose-Capillary: 86 mg/dL (ref 70–99)

## 2019-06-06 MED ORDER — PANTOPRAZOLE SODIUM 40 MG PO TBEC
40.0000 mg | DELAYED_RELEASE_TABLET | Freq: Every day | ORAL | Status: DC
  Administered 2019-06-06 – 2019-06-13 (×8): 40 mg via ORAL
  Filled 2019-06-06 (×8): qty 1

## 2019-06-06 MED ORDER — PRO-STAT SUGAR FREE PO LIQD
30.0000 mL | Freq: Every day | ORAL | Status: DC
  Administered 2019-06-07 – 2019-06-13 (×7): 30 mL via ORAL
  Filled 2019-06-06 (×7): qty 30

## 2019-06-06 MED ORDER — METOPROLOL TARTRATE 25 MG PO TABS
25.0000 mg | ORAL_TABLET | Freq: Two times a day (BID) | ORAL | Status: DC
  Administered 2019-06-06 – 2019-06-07 (×3): 25 mg via ORAL
  Filled 2019-06-06 (×4): qty 1

## 2019-06-06 NOTE — Plan of Care (Signed)
Patient will remain free from infection and complications

## 2019-06-06 NOTE — Progress Notes (Signed)
Removed corpak at this time. No distress noted. Patient tolerated well. Will continue to monitor.

## 2019-06-06 NOTE — Progress Notes (Signed)
PROGRESS NOTE    Betty [name]  MPN:[id number] DOB: [birth year] DOA: 05/13/2019 PCP: Josetta Huddle, MD   Brief Narrative:  73 year old BF PMHx dementia, CAD, NSCLC in 2016, HTN, HLD COPD, kidney stage IV, hronic kidney disease stage IV, chronic systolic and diastolic CHF. Pt brought to the hospital by the patient's son due to increased confusion and weakness. She was diagnosed with coronavirus in the ED on 7/9. She was also found to be hypoxic requiring 2 L nasal cannula. She was admitted to the hospital and placed on steroids along with Remdesivir, deferred to ICU on 7/10, where she required intubation, she was extubated on 7/13, where she was reintubated that same day due to encephalopathy, was extubated 7/21   Subjective: No acute complaint no nausea no vomiting.  Still minimal oral intake.  No acute events.   Assessment & Plan:   Principal Problem:   COVID-19 Active Problems:   Non-small cell carcinoma of right lung, stage 1 (HCC)   CKD (chronic kidney disease), stage IV (HCC)   Chronic systolic heart failure (HCC)   Dementia (HCC)   Essential hypertension   Acute respiratory failure with hypoxemia (HCC)   Pressure injury of skin   Altered mental status   Acute respiratory failure with hypoxia/COVID 19 pneumonia -Completed course of Remdesivir, Actemra -Intubated 7/10, extubated 7/13 with subsequent reintubation that same day: successfully extubated 7/21 -DuoNeb every 6 hours No results for input(s): DDIMER, FERRITIN, LDH, CRP in the last 72 hours.  Acute metabolic encephalopathy, likely in the setting of sepsis with UTI, likely POA septic shock ruled out -Continue ceftriaxone empirically -Cultures remarkable for 70,000 colonies of gram-negative rods, likely E. coli given history -Mental status appears to be improving per nursing at bedside and compared to previous documentation -Patient remains alert and oriented.  Monitor.   Chronic systolic and diastolic CHF -EF 30 to  00%, not in acute exacerbation -Strict in and out/Daily weight Filed Weights   06/02/19 0330 06/03/19 0500 06/04/19 0343  Weight: 57.1 kg 54.5 kg 46.3 kg  - June 2020 echocardiogram EF 30 to 35%.    Newly diagnosed/new onset: Paroxysmal A. fib with RVR, rate controlled - Continue home metoprolol - Briefly on amiodarone drip but now discontinued. - On Lovenox - Patient is likely high risk for falls, recent GI bleed, advanced age and not a candidate for anticoagulation. Recommend full dose aspirin at this point - can follow with PCP for further evaluation and treatment.  Lower GI bleed, resolved - During his hospitalization patient noted to have bloody stools, gastric lavage did not show any blood in the stomach - not a candidate for endoscopy per previous documentation. - status post 1 u PRBC previously - Currently blood noticed around Flexi-Seal and watery fluid within her gastric bag possibly positive for blood. - 7/26 fecal occult blood negative  - H/H stable  Symptomatic Hypernatremia, resolving Recent Labs  Lab 06/02/19 0913 06/03/19 0828 06/04/19 0805 06/05/19 0156 06/06/19 0145  NA 141 139 142 142 141  -Continue free water 300 ml every 6 hours   Hypokalemia, resolved ?Hyperkalemia vs hemolyzed blood samples -Potassium goal> 4 -K 5.1 today  Acute on CKD stage IV (baseline Cr 2- 3) Recent Labs  Lab 05/31/19 0404 05/31/19 0815 06/01/19 8676 06/02/19 1950 06/02/19 0913 06/03/19 9326 06/04/19 0805 06/05/19 0156 06/06/19 0145  CREATININE 1.90* 1.92* 2.04* 2.26* 2.36* 2.32* 2.25* 2.42* 2.09*  - Near baseline  Prediabetes vs hyperglycemia - 7/1 hemoglobin A1c= 6.9 -Prior EMR has had  multiple episodes of steroids in the past 1 to 2 months for acute gout as well as COVID.  Patient now off steroids - Levemir 10 units daily - Resistant SSI  Dementia - Discussed case with PCCM staff patient's baseline is; able to care for herself independently  Hx NSCLC/COPD  -S/p RLL lobectomy + 4 cycles of adjuvant chemotherapy -Lung cancer appears to be in remission per EMR   Hx gout  -Per EMR last 1 to 39-month treated for acute gout with steroids.  Does not take steroids on a regular basis -7/27 uric acid= 6.1  Nutrition -7/27 requested speech reevaluate patient's dysphagia.  Speech recommends dysphagia 2  nectar thick liquid -7/28 speech is cleared patient for diet however patient's caloric intake extremely poor right now do not believe she is meeting her requirements through p.o. intake. - 7/29 initiate night tube feedings until patient can tolerate PO adequately enough to maintain caloric intake -I 06/06/2023 cor track discontinued.  Speech therapy cleared the patient for dysphagia type III diet.  Will monitor overall intake over the weekend.  DVT prophylaxis: Lovenox Code Status: DNR Family Communication: None.   Disposition Plan: Likely to return to SNF pending clinical improvement  Consultants:  PCCM  Procedures/Significant Events:  6/17 echocardiogram:Left Ventricle: The left ventricle has moderate-severely reduced systolic function. -EF 30-35%.  -Left ventricular diffuse hypokinesis.  I have personally reviewed and interpreted all radiology studies and my findings are as above.  Cultures 7/13 MRSA by PCR negative 7/17 tracheal aspirate normal respiratory flora 7/23 blood LEFT hand negative 7/23 blood RIGHT hand negative 7/26 urine positive gram-negative rods   Antimicrobials: Anti-infectives (From admission, onward)   Start     Stop   06/02/19 1800  cefTRIAXone (ROCEPHIN) 2 g in sodium chloride 0.9 % 100 mL IVPB         05/22/19 1130  vancomycin (VANCOCIN) 1,250 mg in sodium chloride 0.9 % 250 mL IVPB     05/22/19 1302   05/22/19 1057  vancomycin variable dose per unstable renal function (pharmacist dosing)  Status:  Discontinued     05/24/19 1406   05/19/19 1030  piperacillin-tazobactam (ZOSYN) IVPB 2.25 g     05/25/19 2359    05/15/19 1230  remdesivir 100 mg in sodium chloride 0.9 % 250 mL IVPB     05/18/19 1340   05/14/19 1230  remdesivir 200 mg in sodium chloride 0.9 % 250 mL IVPB     05/14/19 1551     Continuous Infusions: . sodium chloride Stopped (05/27/19 0654)    Objective: Vitals:   06/06/19 0740 06/06/19 0946 06/06/19 1128 06/06/19 1606  BP: 124/70 131/72  109/81  Pulse: (!) 114 89 98 100  Resp: 20   (!) 23  Temp: 98.4 F (36.9 C)  98.2 F (36.8 C) 98.9 F (37.2 C)  TempSrc: Axillary  Oral Oral  SpO2:    94%  Weight:      Height:       No intake or output data in the 24 hours ending 06/06/19 1748 Filed Weights   06/02/19 0330 06/03/19 0500 06/04/19 0343  Weight: 57.1 kg 54.5 kg 46.3 kg   .Physical Exam:  General: Awake, alert, AO to person, place and general situation - incorrect month but correct year Eyes: negative scleral hemorrhage, negative anisocoria, negative icterus ENT: Negative Runny nose, negative gingival bleeding, Neck:  Negative scars, masses, torticollis, lymphadenopathy, JVD Lungs: Clear to auscultation bilaterally without wheezes or crackles Cardiovascular: Regular rate and rhythm without murmur  gallop or rub normal S1 and S2 Abdomen: negative abdominal pain, nondistended, positive soft, bowel sounds, no rebound, no ascites, no appreciable mass Extremities: No significant cyanosis, clubbing, or edema bilateral lower extremities Skin: Negative rashes, lesions, ulcers Psychiatric: Unable to evaluate secondary to patient's refusal to answer questions   Central nervous system: Spontaneously moves all extremities, withdraws to painful stimuli   Data Reviewed: Care during the described time interval was provided by me .  I have reviewed this patient's available data, including medical history, events of note, physical examination, and all test results as part of my evaluation.   CBC: Recent Labs  Lab 06/02/19 0659 06/03/19 0828 06/04/19 0805 06/05/19 0156 06/06/19  0145  WBC 9.9 11.1* 10.3 9.6 9.5  NEUTROABS 6.9 8.0* 7.3 6.6 6.6  HGB 12.7 12.5 12.6 13.0 12.3  HCT 39.9 37.9 39.1 40.0 38.4  MCV 96.8 95.2 96.8 96.2 97.2  PLT PLATELET CLUMPS NOTED ON SMEAR, COUNT APPEARS ADEQUATE 200 249 254 158   Basic Metabolic Panel: Recent Labs  Lab 06/02/19 0659 06/02/19 0913 06/03/19 0828 06/04/19 0805 06/05/19 0156 06/06/19 0145  NA 141 141 139 142 142 141  K 5.8* 5.8* 5.1 5.1 4.9 5.0  CL 107 106 103 106 107 107  CO2 22 25 22 23 24  21*  GLUCOSE 120* 142* 99 89 150* 118*  BUN 58* 62* 70* 67* 67* 57*  CREATININE 2.26* 2.36* 2.32* 2.25* 2.42* 2.09*  CALCIUM 9.2 9.4 9.1 9.4 9.6 9.1  MG 2.1 2.2 2.1 2.2 2.1 2.0  PHOS 5.2* 5.2* 5.6* 5.3* 5.2*  --    GFR: Estimated Creatinine Clearance: 17.3 mL/min (A) (by C-G formula based on SCr of 2.09 mg/dL (H)). Liver Function Tests: Recent Labs  Lab 06/02/19 0913 06/03/19 0828 06/04/19 0805 06/05/19 0156 06/06/19 0145  AST 46* 40 26 23 25   ALT 26 24 24 21 21   ALKPHOS 68 73 75 74 67  BILITOT 0.9 0.6 0.4 0.6 0.5  PROT 6.8 6.5 6.5 6.6 6.2*  ALBUMIN 3.2* 3.0* 3.1* 3.1* 2.9*   No results for input(s): LIPASE, AMYLASE in the last 168 hours. No results for input(s): AMMONIA in the last 168 hours. Coagulation Profile: No results for input(s): INR, PROTIME in the last 168 hours. Cardiac Enzymes: No results for input(s): CKTOTAL, CKMB, CKMBINDEX, TROPONINI in the last 168 hours. BNP (last 3 results) No results for input(s): PROBNP in the last 8760 hours. HbA1C: No results for input(s): HGBA1C in the last 72 hours. CBG: Recent Labs  Lab 06/05/19 1626 06/05/19 1640 06/05/19 1915 06/06/19 0747 06/06/19 1604  GLUCAP 154* 141* 118* 148* 102*   Lipid Profile: No results for input(s): CHOL, HDL, LDLCALC, TRIG, CHOLHDL, LDLDIRECT in the last 72 hours. Thyroid Function Tests: No results for input(s): TSH, T4TOTAL, FREET4, T3FREE, THYROIDAB in the last 72 hours. Anemia Panel: No results for input(s):  VITAMINB12, FOLATE, FERRITIN, TIBC, IRON, RETICCTPCT in the last 72 hours. Urine analysis:    Component Value Date/Time   COLORURINE YELLOW 05/21/2019 1541   APPEARANCEUR CLEAR 05/21/2019 1541   LABSPEC 1.012 05/21/2019 1541   PHURINE 5.0 05/21/2019 1541   GLUCOSEU NEGATIVE 05/21/2019 1541   HGBUR MODERATE (A) 05/21/2019 1541   BILIRUBINUR NEGATIVE 05/21/2019 1541   KETONESUR NEGATIVE 05/21/2019 1541   PROTEINUR NEGATIVE 05/21/2019 1541   UROBILINOGEN 0.2 04/27/2015 0834   NITRITE NEGATIVE 05/21/2019 1541   LEUKOCYTESUR NEGATIVE 05/21/2019 1541   Sepsis Labs: @LABRCNTIP (procalcitonin:4,lacticidven:4)  ) Recent Results (from the past 240 hour(s))  Culture, blood (  routine x 2)     Status: None   Collection Time: 05/28/19  4:30 PM   Specimen: BLOOD  Result Value Ref Range Status   Specimen Description   Final    BLOOD LEFT HAND Performed at [facility] [address], [place], [place] [address]    Special Requests   Final    BOTTLES DRAWN AEROBIC ONLY Blood Culture adequate volume Performed at [facility] [address] [place], [place] [address]    Culture   Final    NO GROWTH 5 DAYS Performed at [facility], [place] [address] [place],  [address]    Report Status 06/02/2019 FINAL  Final  Culture, blood (routine x 2)     Status: None   Collection Time: 05/28/19  4:36 PM   Specimen: BLOOD  Result Value Ref Range Status   Specimen Description   Final    BLOOD RIGHT HAND Performed at [facility] [address] [place], [place] [address]    Special Requests   Final    BOTTLES DRAWN AEROBIC ONLY Blood Culture adequate volume Performed at [facility] [address], [place], [place] [address]    Culture   Final    NO GROWTH 5 DAYS Performed at [facility], [place] [address] [place], [place] [address]    Report Status 06/02/2019 FINAL  Final  Culture, Urine     Status:  Abnormal   Collection Time: 05/31/19  4:24 AM   Specimen: Urine, Random  Result Value Ref Range Status   Specimen Description   Final    URINE, RANDOM Performed at [facility] [address], [place], [place] [address]    Special Requests   Final    NONE Performed at [facility], [place] [address], [place], [place] [address]    Culture (A)  Final    >=100,000 COLONIES/mL ESCHERICHIA COLI Two isolates with different morphologies were identified as the same organism.The most resistant organism was reported. Performed at [facility], [place] [address], [place],  [address]    Report Status 06/04/2019 FINAL  Final   Organism ID, Bacteria ESCHERICHIA COLI (A)  Final      Susceptibility   Escherichia coli - MIC*    AMPICILLIN >=32 RESISTANT Resistant     CEFAZOLIN <=4 SENSITIVE Sensitive     CEFTRIAXONE <=1 SENSITIVE Sensitive     CIPROFLOXACIN <=0.25 SENSITIVE Sensitive     GENTAMICIN <=1 SENSITIVE Sensitive     IMIPENEM <=0.25 SENSITIVE Sensitive     NITROFURANTOIN <=16 SENSITIVE Sensitive     TRIMETH/SULFA <=20 SENSITIVE Sensitive     AMPICILLIN/SULBACTAM 16 INTERMEDIATE Intermediate     Extended ESBL NEGATIVE Sensitive     * >=100,000 COLONIES/mL ESCHERICHIA COLI     Radiology Studies: No results found.  Scheduled Meds: . chlorhexidine  15 mL Mouth Rinse BID  . Chlorhexidine Gluconate Cloth  6 each Topical Daily  . docusate  100 mg Oral BID  . enoxaparin (LOVENOX) injection  50 mg Subcutaneous Q24H  . feeding supplement (ENSURE ENLIVE)  237 mL Oral TID BM  . [START ON 06/07/2019] feeding supplement (PRO-STAT SUGAR FREE 64)  30 mL Oral Daily  . Ipratropium-Albuterol  1 puff Inhalation Q6H  . mouth rinse  15 mL Mouth Rinse q12n4p  . metoprolol tartrate  25 mg Oral BID  . pantoprazole  40 mg Oral Daily  . sodium  chloride flush  3 mL Intravenous Q12H   Continuous Infusions: . sodium chloride Stopped (05/27/19 0654)     LOS:  23 days   Time spent: 40 minutes   Berle Mull, MD [facility]  If 7PM-7AM, please contact night-coverage [email or web address] Password [facility] 06/06/2019, 5:48 PM

## 2019-06-06 NOTE — Progress Notes (Signed)
Patient incontinent of urine

## 2019-06-06 NOTE — NC FL2 (Signed)
[place] MEDICAID FL2 LEVEL OF CARE SCREENING TOOL     IDENTIFICATION  Patient Name: Betty [name] Birthdate: November 24, 1945 Sex: female Admission Date (Current Location): 05/13/2019  [facility] and [place] Number:  Herbalist and Address:  [facility] [place], [place] [address], [place], [place] [address]      Provider Number: [id number]  Attending Physician Name and Address:  Lavina Hamman, MD  Relative Name and Phone Number:  Chrissie Noa [phone]    Current Level of Care: Hospital Recommended Level of Care: [place] Prior Approval Number:    Date Approved/Denied:   PASRR Number: [id number]  Discharge Plan: SNF    Current Diagnoses: Patient Active Problem List   Diagnosis Date Noted  . Pressure injury of skin 05/29/2019  . Altered mental status   . Acute respiratory failure with hypoxemia ([place])   . COVID-19 05/14/2019  . Essential hypertension 05/14/2019  . Dementia ([place]) 07/16/2018  . Cerebral aneurysm, nonruptured 07/16/2018  . Nonischemic cardiomyopathy ([place]) 05/21/2018  . Congestive heart failure, NYHA class 3, chronic, systolic ([place]) 02/77/4128  . Chronic systolic heart failure ([place])   . Chest pain 04/22/2018  . 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 NSTEMI (non-ST elevated myocardial infarction) ([place]) 03/24/2018  . Pure hypercholesterolemia   . CKD (chronic kidney disease), stage IV ([place])   . Non-small cell carcinoma of right lung, stage 1 ([place]) 05/17/2015  . S/P lobectomy of lung 04/29/2015    Orientation RESPIRATION BLADDER Height & Weight     Self  Normal Incontinent, External catheter Weight: 102 lb 1.2 oz (46.3 kg) Height:  5\' 4"  (162.6 cm)  BEHAVIORAL SYMPTOMS/MOOD NEUROLOGICAL BOWEL NUTRITION STATUS      Incontinent Feeding tube(nasoenteric cortrak feeding tube)  AMBULATORY STATUS COMMUNICATION OF NEEDS Skin   Extensive Assist Verbally PU Stage and Appropriate Care(pressure injury stage 2  coccyx.)                       Personal Care Assistance Level of Assistance  Bathing, Feeding, Dressing, Total care Bathing Assistance: Maximum assistance Feeding assistance: Maximum assistance(nasoenteric feeding tube cortrak, intermittent tube feeding, vital high protein) Dressing Assistance: Maximum assistance Total Care Assistance: Maximum assistance   Functional Limitations Info  Sight, Hearing, Speech Sight Info: Impaired Hearing Info: Adequate Speech Info: Adequate    SPECIAL CARE FACTORS FREQUENCY  PT (By licensed PT), OT (By licensed OT)     PT Frequency: min 5x weekly OT Frequency: min 5x weekly            Contractures Contractures Info: Not present    Additional Factors Info  Code Status, Allergies, Isolation Precautions Code Status Info: DNR Allergies Info: Sulfa Antibiotics     Isolation Precautions Info: emerging pathogen, air and contact precautions     Current Medications (06/06/2019):  This is the current hospital active medication list Current Facility-Administered Medications  Medication Dose Route Frequency Provider Last Rate Last Dose  . 0.9 %  sodium chloride infusion   Intravenous PRN Elgergawy, Silver Huguenin, MD   Stopped at 05/27/19 0654  . acetaminophen (TYLENOL) solution 325 mg  325 mg Oral Q6H PRN Allie Bossier, MD   325 mg at 06/03/19 2123  . bisacodyl (DULCOLAX) EC tablet 5 mg  5 mg Oral Daily PRN Karmen Bongo, MD      . cefTRIAXone (ROCEPHIN) 2 g in sodium chloride 0.9 % 100 mL IVPB  2 g Intravenous Q24H Little Ishikawa, MD  200 mL/hr at 06/05/19 1800 2 g at 06/05/19 1800  . chlorhexidine (PERIDEX) 0.12 % solution 15 mL  15 mL Mouth Rinse BID Bonnielee Haff, MD   15 mL at 06/06/19 0947  . Chlorhexidine Gluconate Cloth 2 % PADS 6 each  6 each Topical Daily Juanito Doom, MD   6 each at 06/06/19 [phone]  . docusate (COLACE) 50 MG/5ML liquid 100 mg  100 mg Oral BID Caren Griffins, MD   100 mg at 06/06/19 0948  . enoxaparin  (LOVENOX) injection 50 mg  50 mg Subcutaneous Q24H Bonnielee Haff, MD   50 mg at 06/05/19 1334  . feeding supplement (ENSURE ENLIVE) (ENSURE ENLIVE) liquid 237 mL  237 mL Oral TID BM Little Ishikawa, MD   237 mL at 06/06/19 0949  . feeding supplement (OSMOLITE 1.5 CAL) liquid 1,000 mL  1,000 mL Per Tube Q24H Thurnell Lose, MD 65 mL/hr at 06/05/19 1749 1,000 mL at 06/05/19 1749  . feeding supplement (PRO-STAT SUGAR FREE 64) liquid 30 mL  30 mL Per Tube Daily Bonnielee Haff, MD   30 mL at 06/06/19 0947  . free water 300 mL  300 mL Per Tube Q6H Simonne Maffucci B, MD   300 mL at 06/06/19 0636  . Ipratropium-Albuterol (COMBIVENT) respimat 1 puff  1 puff Inhalation Q6H Allie Bossier, MD   1 puff at 06/06/19 [phone]  . labetalol (NORMODYNE) injection 10 mg  10 mg Intravenous Q4H PRN Bonnielee Haff, MD      . MEDLINE mouth rinse  15 mL Mouth Rinse q12n4p Chesley Mires, MD   15 mL at 06/05/19 1334  . metoprolol tartrate (LOPRESSOR) 25 mg/10 mL oral suspension 25 mg  25 mg Per Tube BID Bonnielee Haff, MD   25 mg at 06/06/19 0946  . ondansetron (ZOFRAN) tablet 4 mg  4 mg Oral Q6H PRN Karmen Bongo, MD       Or  . ondansetron [facility]) injection 4 mg  4 mg Intravenous Q6H PRN Karmen Bongo, MD      . pantoprazole (PROTONIX) injection 40 mg  40 mg Intravenous Q24H Chesley Mires, MD   40 mg at 06/05/19 2150  . polyethylene glycol (MIRALAX / GLYCOLAX) packet 17 g  17 g Oral Daily PRN Karmen Bongo, MD      . Resource ThickenUp Clear   Oral PRN Allie Bossier, MD      . sodium chloride flush (NS) 0.9 % injection 3 mL  3 mL Intravenous Q12H Karmen Bongo, MD   3 mL at 06/06/19 0321     Discharge Medications: Please see discharge summary for a list of discharge medications.  Relevant Imaging Results:  Relevant Lab Results:   Additional Information SSN: [id number]  Alberteen Sam, LCSW

## 2019-06-07 LAB — CBC WITH DIFFERENTIAL/PLATELET
Abs Immature Granulocytes: 0.04 10*3/uL (ref 0.00–0.07)
Basophils Absolute: 0 10*3/uL (ref 0.0–0.1)
Basophils Relative: 0 %
Eosinophils Absolute: 0.1 10*3/uL (ref 0.0–0.5)
Eosinophils Relative: 1 %
HCT: 37 % (ref 36.0–46.0)
Hemoglobin: 11.8 g/dL — ABNORMAL LOW (ref 12.0–15.0)
Immature Granulocytes: 0 %
Lymphocytes Relative: 16 %
Lymphs Abs: 1.8 10*3/uL (ref 0.7–4.0)
MCH: 31 pg (ref 26.0–34.0)
MCHC: 31.9 g/dL (ref 30.0–36.0)
MCV: 97.1 fL (ref 80.0–100.0)
Monocytes Absolute: 1 10*3/uL (ref 0.1–1.0)
Monocytes Relative: 9 %
Neutro Abs: 8.2 10*3/uL — ABNORMAL HIGH (ref 1.7–7.7)
Neutrophils Relative %: 74 %
Platelets: 278 10*3/uL (ref 150–400)
RBC: 3.81 MIL/uL — ABNORMAL LOW (ref 3.87–5.11)
RDW: 17.2 % — ABNORMAL HIGH (ref 11.5–15.5)
WBC: 11.1 10*3/uL — ABNORMAL HIGH (ref 4.0–10.5)
nRBC: 0 % (ref 0.0–0.2)

## 2019-06-07 LAB — COMPREHENSIVE METABOLIC PANEL
ALT: 19 U/L (ref 0–44)
AST: 22 U/L (ref 15–41)
Albumin: 3 g/dL — ABNORMAL LOW (ref 3.5–5.0)
Alkaline Phosphatase: 88 U/L (ref 38–126)
Anion gap: 10 (ref 5–15)
BUN: 47 mg/dL — ABNORMAL HIGH (ref 8–23)
CO2: 23 mmol/L (ref 22–32)
Calcium: 9.4 mg/dL (ref 8.9–10.3)
Chloride: 109 mmol/L (ref 98–111)
Creatinine, Ser: 2.16 mg/dL — ABNORMAL HIGH (ref 0.44–1.00)
GFR calc Af Amer: 25 mL/min — ABNORMAL LOW (ref >60)
GFR calc non Af Amer: 22 mL/min — ABNORMAL LOW (ref >60)
Glucose, Bld: 93 mg/dL (ref 70–99)
Potassium: 4.6 mmol/L (ref 3.5–5.1)
Sodium: 142 mmol/L (ref 135–145)
Total Bilirubin: 0.6 mg/dL (ref 0.3–1.2)
Total Protein: 6.5 g/dL (ref 6.5–8.1)

## 2019-06-07 LAB — GLUCOSE, CAPILLARY
Glucose-Capillary: 98 mg/dL (ref 70–99)
Glucose-Capillary: 142 mg/dL — ABNORMAL HIGH (ref 70–99)
Glucose-Capillary: 179 mg/dL — ABNORMAL HIGH (ref 70–99)

## 2019-06-07 LAB — BRAIN NATRIURETIC PEPTIDE: B Natriuretic Peptide: 152.7 pg/mL — ABNORMAL HIGH (ref 0.0–100.0)

## 2019-06-07 LAB — MAGNESIUM: Magnesium: 2 mg/dL (ref 1.7–2.4)

## 2019-06-07 MED ORDER — MIRTAZAPINE 15 MG PO TABS
7.5000 mg | ORAL_TABLET | Freq: Every day | ORAL | Status: DC
  Administered 2019-06-07 – 2019-06-12 (×5): 7.5 mg via ORAL
  Filled 2019-06-07: qty 1
  Filled 2019-06-07: qty 0.5
  Filled 2019-06-07 (×2): qty 1
  Filled 2019-06-07: qty 0.5

## 2019-06-07 NOTE — Plan of Care (Signed)
Will continue to monitor patient and continue with plan of care.

## 2019-06-07 NOTE — Progress Notes (Signed)
ANTICOAGULATION CONSULT NOTE - Follow Up Consult  Pharmacy Consult for Lovenox Indication: atrial fibrillation  Allergies  Allergen Reactions  . Sulfa Antibiotics Itching    Patient Measurements: Height: 5\' 4"  (162.6 cm) Weight: 102 lb 1.2 oz (46.3 kg) IBW/kg (Calculated) : 54.7  Vital Signs: Temp: 98 F (36.7 C) (08/02 1301) Temp Source: Oral (08/02 1301) BP: 114/68 (08/02 1301) Pulse Rate: 113 (08/02 1301)  Labs: Recent Labs    06/05/19 0156 06/06/19 0145 06/07/19 0325  HGB 13.0 12.3 11.8*  HCT 40.0 38.4 37.0  PLT 254 239 278  CREATININE 2.42* 2.09* 2.16*    Estimated Creatinine Clearance: 16.7 mL/min (A) (by C-G formula based on SCr of 2.16 mg/dL (H)).   Assessment: 73 y.o. female admitted on 05/13/2019 with COVID-19 pneumonia, requiring intubation.  Patient developed atrial fibrillation (CHA2DS2/VAS= 5), started on amiodarone which was stopped, was being AV paced. Has been on VTE prophylaxis with SQ heparin, then developed GI bleed on 7/17 with Hgb decreased 7.5 for which she was transfused.  No evidence of further bleeding and Hgb better so full-dose Lovenox started for afib on 7/19, dose was held on 7/23, but resumed on 7/24.  FOB negative on 05/31/19.  Long term plan for anticoagulation is ASA rather than full anticoagulation. We will cont the current dose while she is here since the level was appropriate.   Goal of Therapy:  Anti-Xa level 0.6-1 units/ml (4 hours post dose) Monitor platelets by anticoagulation protocol: Yes   Plan:  - Continue Lovenox 50 mg daily  - CBC at least q 72h.  Monitor for bleeding   Sherlon Handing, PharmD, BCPS CGV Clinical pharmacist phone [phone] 06/07/2019 1:56 PM

## 2019-06-07 NOTE — Progress Notes (Signed)
Occupational Therapy Treatment Patient Details Name: Betty [name] MRN: [id number] DOB: 11/19/1945 Today's Date: 06/07/2019    History of present illness Pt is a 73 y.o. female admitted 05/13/19 with AMS and weakness; diagnosed with COVID-19 oin 7/9. ETT 7/10-7/13, reintubated 7/13- 7/21; transfer out of ICU 7/22. Returned to ICU 7/23 with worsening encephalopathy. Plan to transfer to floor 7/24. PMH includes CAD, right ruptured cerebral aneurysm,non-small cell lung CA, HTN, COPD, CKD IV, CHF, dementia., PPM, R L L lobectomy., NSTEMI, afib   OT comments  Pt supine in bed and awake upon arrival; HR 120s, RR 20s, and SpO2 90s on RA. Pt tangential in speech and not making full sentences at times; perseverating on certain topics and not oriented to place, situation, and time. Pt tolerating sitting at EOB to complete oral care with bed placed by sink. Pt requiring Min Guard A for safety at EOB and then Max cues for initiation of tasks. Pt requiring Mod-Max A for bed mobility. Continue to recommend dc to SNF and will continue to follow acutely as admitted.   Pt reporting she enjoys music and stated she liks "bluegreen." Pt nodding her head yes with correctional question of "bluegrass?" Placed "Gerrit Heck" to play on computer and pt closed her eyes and moved her head back and forth to music.    Follow Up Recommendations  SNF;Supervision/Assistance - 24 hour    Equipment Recommendations  Other (comment)(TBD in next venue)    Recommendations for Other Services      Precautions / Restrictions Precautions Precautions: Fall Precaution Comments:  incontinent Restrictions Weight Bearing Restrictions: No       Mobility Bed Mobility Overal bed mobility: Needs Assistance Bed Mobility: Sit to Supine;Supine to Sit     Supine to sit: Max assist Sit to supine: Mod assist   General bed mobility comments: Max A to bring BLEs towards EOB and then elevate trunk; more for cognition. Pt able to  return to supine with Mod A for elevate BLEs  Transfers                 General transfer comment: defered for safety    Balance Overall balance assessment: Needs assistance Sitting-balance support: Bilateral upper extremity supported Sitting balance-Leahy Scale: Fair Sitting balance - Comments: Pt sitting at EOB with Min Guard A                                   ADL either performed or assessed with clinical judgement   ADL Overall ADL's : Needs assistance/impaired     Grooming: Oral care;Min guard;Sitting Grooming Details (indicate cue type and reason): While sitting at EOB, pt performed oral care with Min Guard A for safety and Max cues to intiate task. Placed bedside table in front of pt to perform oral care and she stated "there has got to be a way to make this easier and go over there (pointing to sink)." Rolled bed towards sink so she could perform oral care at sink. Pt attempting to stand once and stated "oh that is't going to work." Assume pt relased her BLE weakness with attempted stand. Pt tolerated sitting up for ~10 minutes.                                General ADL Comments: Pt agreeable to oral care at EOB demonstrating increased  sitting tolerance, balance, and activity tolerance. Will attempt OOB activity with +2 at next session.      Vision       Perception     Praxis      Cognition Arousal/Alertness: Awake/alert Behavior During Therapy: Restless;Impulsive Overall Cognitive Status: Impaired/Different from baseline Area of Impairment: Orientation;Attention;Memory;Following commands;Safety/judgement;Awareness;Problem solving                 Orientation Level: Disoriented to;Place;Time;Situation Current Attention Level: Sustained Memory: Decreased short-term memory Following Commands: Follows one step commands inconsistently Safety/Judgement: Decreased awareness of safety;Decreased awareness of deficits Awareness:  Intellectual Problem Solving: Slow processing;Decreased initiation;Difficulty sequencing;Requires verbal cues;Requires tactile cues General Comments: Unsure if pt is hallucinating people in her room. When asked who was present in room she stated "me...and you." Pt perseverating on topic of "we have to think about how to make this easier", "I work alot and if we dont watch out, they will make [place] work three more hour," and "we gotta think about her and how to talk to her about this work." Pt particiapte in ADLs, pt requiring Max cues and encouragement while discussing topics she was perseverating on.         Exercises     Shoulder Instructions       General Comments HR at 120s with rest in supine; elevated to 130s while sitting at EOB. SpO2 >90% on RA. RR elevating to 30-40s with activity    Pertinent Vitals/ Pain       Pain Assessment: Faces Faces Pain Scale: No hurt Pain Intervention(s): Monitored during session  Home Living                                          Prior Functioning/Environment              Frequency  Min 2X/week        Progress Toward Goals  OT Goals(current goals can now be found in the care plan section)  Progress towards OT goals: Progressing toward goals  Acute Rehab OT Goals Patient Stated Goal: none stated OT Goal Formulation: Patient unable to participate in goal setting Time For Goal Achievement: 06/12/19 Potential to Achieve Goals: Fair ADL Goals Pt Will Perform Grooming: with min guard assist;sitting Pt Will Perform Upper Body Bathing: with min assist;sitting Pt Will Perform Upper Body Dressing: with min assist;sitting Pt Will Transfer to Toilet: with mod assist;stand pivot transfer;bedside commode Pt Will Perform Toileting - Clothing Manipulation and hygiene: with mod assist;sit to/from stand Pt/caregiver will Perform Home Exercise Program: Increased ROM;Increased strength;Both right and left upper extremity;With  minimal assist;With written HEP provided Additional ADL Goal #1: Pt will follow 1 step commands with 75% accuracy during functional tasks. Additional ADL Goal #2: Pt will sustain attention to functional task >5 min without cues.  Plan Discharge plan remains appropriate    Co-evaluation                 AM-PAC OT "6 Clicks" Daily Activity     Outcome Measure   Help from another person eating meals?: A Lot Help from another person taking care of personal grooming?: A Little Help from another person toileting, which includes using toliet, bedpan, or urinal?: Total Help from another person bathing (including washing, rinsing, drying)?: Total Help from another person to put on and taking off regular upper body clothing?: Total Help from another person  to put on and taking off regular lower body clothing?: Total 6 Click Score: 9    End of Session    OT Visit Diagnosis: Muscle weakness (generalized) (M62.81);Other symptoms and signs involving cognitive function;Unsteadiness on feet (R26.81)   Activity Tolerance Patient tolerated treatment well;Patient limited by fatigue   Patient Left with call bell/phone within reach;in bed;with bed alarm set   Nurse Communication Mobility status(Maximove)        Time: 6384-5364 OT Time Calculation (min): 26 min  Charges: OT General Charges $OT Visit: 1 Visit OT Treatments $Self Care/Home Management : 23-37 mins  [place], OTR/L Acute Rehab Pager: [phone] Office: [place] 06/07/2019, 4:34 PM

## 2019-06-07 NOTE — Progress Notes (Signed)
Spoke with son Chrissie Noa this am regarding patients status. Patient doing better she is awake alert and sitting upright in bed. She eat good this am and seems to be improving.

## 2019-06-07 NOTE — Progress Notes (Signed)
PROGRESS NOTE    Betty [name]  PNT:[id number] DOB: 01/18/1946 DOA: 05/13/2019 PCP: Josetta Huddle, MD   Brief Narrative:  73 year old BF PMHx dementia, CAD, NSCLC in 2016, HTN, HLD COPD, kidney stage IV, hronic kidney disease stage IV, chronic systolic and diastolic CHF. Pt brought to the hospital by the patient's son due to increased confusion and weakness. She was diagnosed with coronavirus in the ED on 7/9. She was also found to be hypoxic requiring 2 L nasal cannula. She was admitted to the hospital and placed on steroids along with Remdesivir, deferred to ICU on 7/10, where she required intubation, she was extubated on 7/13, where she was reintubated that same day due to encephalopathy, was extubated 7/21   Subjective: Minimal oral intake.  Diarrhea reported.  No nausea no vomiting.  No abdominal pain.   Assessment & Plan:   Principal Problem:   COVID-19 Active Problems:   Non-small cell carcinoma of right lung, stage 1 (HCC)   CKD (chronic kidney disease), stage IV (HCC)   Chronic systolic heart failure (HCC)   Dementia (HCC)   Essential hypertension   Acute respiratory failure with hypoxemia (HCC)   Pressure injury of skin   Altered mental status   Acute respiratory failure with hypoxia/COVID 19 pneumonia -Completed course of Remdesivir, Actemra -Intubated 7/10, extubated 7/13 with subsequent reintubation that same day: successfully extubated 7/21 -DuoNeb every 6 hours No results for input(s): DDIMER, FERRITIN, LDH, CRP in the last 72 hours.  Acute metabolic encephalopathy, likely in the setting of sepsis with UTI, likely POA septic shock ruled out -Continue ceftriaxone empirically -Cultures remarkable for 70,000 colonies of gram-negative rods, likely E. coli given history -Mental status appears to be improving per nursing at bedside and compared to previous documentation -Patient remains alert and oriented.  Monitor.   Chronic systolic and diastolic CHF -EF 30 to 08%,  not in acute exacerbation -Strict in and out/Daily weight Filed Weights   06/02/19 0330 06/03/19 0500 06/04/19 0343  Weight: 57.1 kg 54.5 kg 46.3 kg  - June 2020 echocardiogram EF 30 to 35%.    Newly diagnosed/new onset: Paroxysmal A. fib with RVR, rate controlled - Continue home metoprolol - Briefly on amiodarone drip but now discontinued. - On Lovenox - Patient is likely high risk for falls, recent GI bleed, advanced age and not a candidate for anticoagulation. Recommend full dose aspirin at this point - can follow with PCP for further evaluation and treatment.  Lower GI bleed, resolved - During his hospitalization patient noted to have bloody stools, gastric lavage did not show any blood in the stomach - not a candidate for endoscopy per previous documentation. - status post 1 u PRBC previously - Currently blood noticed around Flexi-Seal and watery fluid within her gastric bag possibly positive for blood. - 7/26 fecal occult blood negative  - H/H stable  Symptomatic Hypernatremia, resolving Recent Labs  Lab 06/03/19 0828 06/04/19 0805 06/05/19 0156 06/06/19 0145 06/07/19 0325  NA 139 142 142 141 142  -Continue free water 300 ml every 6 hours   Hypokalemia, resolved ?Hyperkalemia vs hemolyzed blood samples -Potassium goal> 4 -K 5.1 today  Acute on CKD stage IV (baseline Cr 2- 3) Recent Labs  Lab 06/01/19 0208 06/02/19 0659 06/02/19 0913 06/03/19 0828 06/04/19 0805 06/05/19 0156 06/06/19 0145 06/07/19 0325  CREATININE 2.04* 2.26* 2.36* 2.32* 2.25* 2.42* 2.09* 2.16*  - Near baseline  Prediabetes vs hyperglycemia - 7/1 hemoglobin A1c= 6.9 -Prior EMR has had multiple episodes of steroids  in the past 1 to 2 months for acute gout as well as COVID.  Patient now off steroids - Levemir 10 units daily - Resistant SSI  Dementia - Discussed case with PCCM staff patient's baseline is; able to care for herself independently  Hx NSCLC/COPD -S/p RLL lobectomy + 4  cycles of adjuvant chemotherapy -Lung cancer appears to be in remission per EMR   Hx gout  -Per EMR last 1 to 43-month treated for acute gout with steroids.  Does not take steroids on a regular basis -7/27 uric acid= 6.1  Nutrition -7/27 requested speech reevaluate patient's dysphagia.  Speech recommends dysphagia 2  nectar thick liquid -7/28 speech is cleared patient for diet however patient's caloric intake extremely poor right now do not believe she is meeting her requirements through p.o. intake. - 7/29 initiate night tube feedings until patient can tolerate PO adequately enough to maintain caloric intake -I 06/06/2023 cor track discontinued.  Speech therapy cleared the patient for dysphagia type III diet.  Will monitor overall intake over the weekend. Add Remeron for improvement of his appetite.  DVT prophylaxis: Lovenox Code Status: DNR Family Communication: None.   Disposition Plan: Likely to return to SNF pending improvement in oral intake.  If oral intake does not improve may require clarification of goals of care.  Consultants:  PCCM  Procedures/Significant Events:  6/17 echocardiogram:Left Ventricle: The left ventricle has moderate-severely reduced systolic function. -EF 30-35%.  -Left ventricular diffuse hypokinesis.  I have personally reviewed and interpreted all radiology studies and my findings are as above.  Cultures 7/13 MRSA by PCR negative 7/17 tracheal aspirate normal respiratory flora 7/23 blood LEFT hand negative 7/23 blood RIGHT hand negative 7/26 urine positive gram-negative rods   Antimicrobials: Anti-infectives (From admission, onward)   Start     Stop   06/02/19 1800  cefTRIAXone (ROCEPHIN) 2 g in sodium chloride 0.9 % 100 mL IVPB         05/22/19 1130  vancomycin (VANCOCIN) 1,250 mg in sodium chloride 0.9 % 250 mL IVPB     05/22/19 1302   05/22/19 1057  vancomycin variable dose per unstable renal function (pharmacist dosing)  Status:   Discontinued     05/24/19 1406   05/19/19 1030  piperacillin-tazobactam (ZOSYN) IVPB 2.25 g     05/25/19 2359   05/15/19 1230  remdesivir 100 mg in sodium chloride 0.9 % 250 mL IVPB     05/18/19 1340   05/14/19 1230  remdesivir 200 mg in sodium chloride 0.9 % 250 mL IVPB     05/14/19 1551     Continuous Infusions: . sodium chloride Stopped (05/27/19 0654)    Objective: Vitals:   06/07/19 0409 06/07/19 0829 06/07/19 1301 06/07/19 1621  BP: (!) 87/63 129/70 114/68 (!) 107/92  Pulse:  98 (!) 113 (!) 127  Resp:      Temp: 98.2 F (36.8 C) (!) 97.3 F (36.3 C) 98 F (36.7 C) 97.8 F (36.6 C)  TempSrc: Oral Oral Oral Oral  SpO2:  95% 97% 95%  Weight:      Height:        Intake/Output Summary (Last 24 hours) at 06/07/2019 1726 Last data filed at 06/07/2019 1400 Gross per 24 hour  Intake 1248 ml  Output -  Net 1248 ml   Filed Weights   06/02/19 0330 06/03/19 0500 06/04/19 0343  Weight: 57.1 kg 54.5 kg 46.3 kg   .Physical Exam:  General: Awake, alert, AO to person, place and general situation -  incorrect month but correct year Eyes: negative scleral hemorrhage, negative anisocoria, negative icterus ENT: Negative Runny nose, negative gingival bleeding, Neck:  Negative scars, masses, torticollis, lymphadenopathy, JVD Lungs: Clear to auscultation bilaterally without wheezes or crackles Cardiovascular: Regular rate and rhythm without murmur gallop or rub normal S1 and S2 Abdomen: negative abdominal pain, nondistended, positive soft, bowel sounds, no rebound, no ascites, no appreciable mass Extremities: No significant cyanosis, clubbing, or edema bilateral lower extremities Skin: Negative rashes, lesions, ulcers Psychiatric: Unable to evaluate secondary to patient's refusal to answer questions   Central nervous system: Spontaneously moves all extremities, withdraws to painful stimuli   Data Reviewed: Care during the described time interval was provided by me .  I have reviewed  this patient's available data, including medical history, events of note, physical examination, and all test results as part of my evaluation.   CBC: Recent Labs  Lab 06/03/19 0828 06/04/19 0805 06/05/19 0156 06/06/19 0145 06/07/19 0325  WBC 11.1* 10.3 9.6 9.5 11.1*  NEUTROABS 8.0* 7.3 6.6 6.6 8.2*  HGB 12.5 12.6 13.0 12.3 11.8*  HCT 37.9 39.1 40.0 38.4 37.0  MCV 95.2 96.8 96.2 97.2 97.1  PLT 200 249 254 239 846   Basic Metabolic Panel: Recent Labs  Lab 06/02/19 0659 06/02/19 0913 06/03/19 0828 06/04/19 0805 06/05/19 0156 06/06/19 0145 06/07/19 0325  NA 141 141 139 142 142 141 142  K 5.8* 5.8* 5.1 5.1 4.9 5.0 4.6  CL 107 106 103 106 107 107 109  CO2 22 25 22 23 24  21* 23  GLUCOSE 120* 142* 99 89 150* 118* 93  BUN 58* 62* 70* 67* 67* 57* 47*  CREATININE 2.26* 2.36* 2.32* 2.25* 2.42* 2.09* 2.16*  CALCIUM 9.2 9.4 9.1 9.4 9.6 9.1 9.4  MG 2.1 2.2 2.1 2.2 2.1 2.0 2.0  PHOS 5.2* 5.2* 5.6* 5.3* 5.2*  --   --    GFR: Estimated Creatinine Clearance: 16.7 mL/min (A) (by C-G formula based on SCr of 2.16 mg/dL (H)). Liver Function Tests: Recent Labs  Lab 06/03/19 0828 06/04/19 0805 06/05/19 0156 06/06/19 0145 06/07/19 0325  AST 40 26 23 25 22   ALT 24 24 21 21 19   ALKPHOS 73 75 74 67 88  BILITOT 0.6 0.4 0.6 0.5 0.6  PROT 6.5 6.5 6.6 6.2* 6.5  ALBUMIN 3.0* 3.1* 3.1* 2.9* 3.0*   No results for input(s): LIPASE, AMYLASE in the last 168 hours. No results for input(s): AMMONIA in the last 168 hours. Coagulation Profile: No results for input(s): INR, PROTIME in the last 168 hours. Cardiac Enzymes: No results for input(s): CKTOTAL, CKMB, CKMBINDEX, TROPONINI in the last 168 hours. BNP (last 3 results) No results for input(s): PROBNP in the last 8760 hours. HbA1C: No results for input(s): HGBA1C in the last 72 hours. CBG: Recent Labs  Lab 06/06/19 1604 06/06/19 2128 06/07/19 0826 06/07/19 1232 06/07/19 1618  GLUCAP 102* 86 98 142* 179*   Lipid Profile: No results  for input(s): CHOL, HDL, LDLCALC, TRIG, CHOLHDL, LDLDIRECT in the last 72 hours. Thyroid Function Tests: No results for input(s): TSH, T4TOTAL, FREET4, T3FREE, THYROIDAB in the last 72 hours. Anemia Panel: No results for input(s): VITAMINB12, FOLATE, FERRITIN, TIBC, IRON, RETICCTPCT in the last 72 hours. Urine analysis:    Component Value Date/Time   COLORURINE YELLOW 05/21/2019 [place] 05/21/2019 1541   LABSPEC 1.012 05/21/2019 1541   PHURINE 5.0 05/21/2019 1541   GLUCOSEU NEGATIVE 05/21/2019 1541   HGBUR MODERATE (A) 05/21/2019 1541  BILIRUBINUR NEGATIVE 05/21/2019 [place] 05/21/2019 1541   PROTEINUR NEGATIVE 05/21/2019 1541   UROBILINOGEN 0.2 04/27/2015 0834   NITRITE NEGATIVE 05/21/2019 1541   LEUKOCYTESUR NEGATIVE 05/21/2019 1541   Sepsis Labs: @LABRCNTIP (procalcitonin:4,lacticidven:4)  ) Recent Results (from the past 240 hour(s))  Culture, Urine     Status: Abnormal   Collection Time: 05/31/19  4:24 AM   Specimen: Urine, Random  Result Value Ref Range Status   Specimen Description   Final    URINE, RANDOM Performed at [facility], [place] [address] [place], [place] [address]    Special Requests   Final    NONE Performed at [facility], [place] [address] [place], [place] [address]    Culture (A)  Final    >=100,000 COLONIES/mL ESCHERICHIA COLI Two isolates with different morphologies were identified as the same organism.The most resistant organism was reported. Performed at [facility], [place] [address], [place], [place] [address]    Report Status 06/04/2019 FINAL  Final   Organism ID, Bacteria ESCHERICHIA COLI (A)  Final      Susceptibility   Escherichia coli - MIC*    AMPICILLIN >=32 RESISTANT Resistant     CEFAZOLIN <=4 SENSITIVE Sensitive     CEFTRIAXONE <=1 SENSITIVE Sensitive     CIPROFLOXACIN <=0.25 SENSITIVE Sensitive     GENTAMICIN <=1 SENSITIVE Sensitive     IMIPENEM <=0.25  SENSITIVE Sensitive     NITROFURANTOIN <=16 SENSITIVE Sensitive     TRIMETH/SULFA <=20 SENSITIVE Sensitive     AMPICILLIN/SULBACTAM 16 INTERMEDIATE Intermediate     Extended ESBL NEGATIVE Sensitive     * >=100,000 COLONIES/mL ESCHERICHIA COLI     Radiology Studies: No results found.  Scheduled Meds: . chlorhexidine  15 mL Mouth Rinse BID  . Chlorhexidine Gluconate Cloth  6 each Topical Daily  . enoxaparin (LOVENOX) injection  50 mg Subcutaneous Q24H  . feeding supplement (ENSURE ENLIVE)  237 mL Oral TID BM  . feeding supplement (PRO-STAT SUGAR FREE 64)  30 mL Oral Daily  . Ipratropium-Albuterol  1 puff Inhalation Q6H  . mouth rinse  15 mL Mouth Rinse q12n4p  . metoprolol tartrate  25 mg Oral BID  . mirtazapine  7.5 mg Oral QHS  . pantoprazole  40 mg Oral Daily  . sodium chloride flush  3 mL Intravenous Q12H   Continuous Infusions: . sodium chloride Stopped (05/27/19 0654)     LOS: 24 days   Time spent: 40 minutes   Berle Mull, MD [facility]  If 7PM-7AM, please contact night-coverage [email or web address] Password [facility] 06/07/2019, 5:26 PM

## 2019-06-08 LAB — COMPREHENSIVE METABOLIC PANEL
ALT: 20 U/L (ref 0–44)
AST: 20 U/L (ref 15–41)
Albumin: 2.9 g/dL — ABNORMAL LOW (ref 3.5–5.0)
Alkaline Phosphatase: 82 U/L (ref 38–126)
Anion gap: 13 (ref 5–15)
BUN: 66 mg/dL — ABNORMAL HIGH (ref 8–23)
CO2: 22 mmol/L (ref 22–32)
Calcium: 9.6 mg/dL (ref 8.9–10.3)
Chloride: 114 mmol/L — ABNORMAL HIGH (ref 98–111)
Creatinine, Ser: 2.67 mg/dL — ABNORMAL HIGH (ref 0.44–1.00)
GFR calc Af Amer: 20 mL/min — ABNORMAL LOW (ref >60)
GFR calc non Af Amer: 17 mL/min — ABNORMAL LOW (ref >60)
Glucose, Bld: 120 mg/dL — ABNORMAL HIGH (ref 70–99)
Potassium: 4.4 mmol/L (ref 3.5–5.1)
Sodium: 149 mmol/L — ABNORMAL HIGH (ref 135–145)
Total Bilirubin: 0.5 mg/dL (ref 0.3–1.2)
Total Protein: 6.7 g/dL (ref 6.5–8.1)

## 2019-06-08 LAB — CBC WITH DIFFERENTIAL/PLATELET
Abs Immature Granulocytes: 0.09 10*3/uL — ABNORMAL HIGH (ref 0.00–0.07)
Basophils Absolute: 0 10*3/uL (ref 0.0–0.1)
Basophils Relative: 0 %
Eosinophils Absolute: 0.2 10*3/uL (ref 0.0–0.5)
Eosinophils Relative: 1 %
HCT: 37.7 % (ref 36.0–46.0)
Hemoglobin: 12.3 g/dL (ref 12.0–15.0)
Immature Granulocytes: 1 %
Lymphocytes Relative: 21 %
Lymphs Abs: 2.7 10*3/uL (ref 0.7–4.0)
MCH: 31.7 pg (ref 26.0–34.0)
MCHC: 32.6 g/dL (ref 30.0–36.0)
MCV: 97.2 fL (ref 80.0–100.0)
Monocytes Absolute: 1.1 10*3/uL — ABNORMAL HIGH (ref 0.1–1.0)
Monocytes Relative: 9 %
Neutro Abs: 8.7 10*3/uL — ABNORMAL HIGH (ref 1.7–7.7)
Neutrophils Relative %: 68 %
Platelets: 312 10*3/uL (ref 150–400)
RBC: 3.88 MIL/uL (ref 3.87–5.11)
RDW: 17.3 % — ABNORMAL HIGH (ref 11.5–15.5)
WBC: 12.8 10*3/uL — ABNORMAL HIGH (ref 4.0–10.5)
nRBC: 0 % (ref 0.0–0.2)

## 2019-06-08 LAB — GLUCOSE, CAPILLARY
Glucose-Capillary: 157 mg/dL — ABNORMAL HIGH (ref 70–99)
Glucose-Capillary: 114 mg/dL — ABNORMAL HIGH (ref 70–99)
Glucose-Capillary: 124 mg/dL — ABNORMAL HIGH (ref 70–99)
Glucose-Capillary: 181 mg/dL — ABNORMAL HIGH (ref 70–99)

## 2019-06-08 LAB — MAGNESIUM: Magnesium: 2.1 mg/dL (ref 1.7–2.4)

## 2019-06-08 LAB — BRAIN NATRIURETIC PEPTIDE: B Natriuretic Peptide: 136.4 pg/mL — ABNORMAL HIGH (ref 0.0–100.0)

## 2019-06-08 MED ORDER — SODIUM CHLORIDE 0.9% FLUSH
10.0000 mL | Freq: Two times a day (BID) | INTRAVENOUS | Status: DC
  Administered 2019-06-09: 09:00:00 10 mL
  Administered 2019-06-10: 40 mL
  Administered 2019-06-10 – 2019-06-13 (×2): 10 mL

## 2019-06-08 MED ORDER — CARVEDILOL 3.125 MG PO TABS
6.2500 mg | ORAL_TABLET | Freq: Two times a day (BID) | ORAL | Status: DC
  Administered 2019-06-08 (×2): 6.25 mg via ORAL
  Filled 2019-06-08 (×2): qty 2

## 2019-06-08 MED ORDER — DEXTROSE-NACL 5-0.9 % IV SOLN: INTRAVENOUS | Status: DC

## 2019-06-08 MED ORDER — ADULT MULTIVITAMIN W/MINERALS CH
1.0000 | ORAL_TABLET | Freq: Every day | ORAL | Status: DC
  Administered 2019-06-08 – 2019-06-13 (×6): 1 via ORAL
  Filled 2019-06-08 (×6): qty 1

## 2019-06-08 MED ORDER — METOPROLOL TARTRATE 50 MG PO TABS: 50.0000 mg | ORAL_TABLET | Freq: Two times a day (BID) | ORAL | Status: DC

## 2019-06-08 MED ORDER — SODIUM CHLORIDE 0.9% FLUSH: 10.0000 mL | INTRAVENOUS | Status: DC | PRN

## 2019-06-08 NOTE — Progress Notes (Signed)
approx 2100 pt was refusing the IV team nurse to start any IV. I assisted in explaining what was needed and pt did not consent until I spoke with son Betty [name] who was on speakerphone and explained multiple times to his mother he had spoken with the doctor / nurse about her needing IV access and that he agreed and felt it was necessary - pt finally consented and cooperated to the placement of left arm  midline access.

## 2019-06-08 NOTE — Progress Notes (Signed)
  Speech Language Pathology Treatment: Dysphagia  Patient Details Name: Betty [name] MRN: [id number] DOB: [birth year] Today's Date: 06/08/2019 Time: 7408-1448 SLP Time Calculation (min) (ACUTE ONLY): 22 min  Assessment / Plan / Recommendation Clinical Impression  Pt is alert but quite confused and apprehensive, concerned that SLP is trying to invest her money and may lose it all. Given Max cues for redirection, she self-fed approximately half a bottle of Ensure. Throat clearing is noted at baseline and persists throughout trials, suspect similar to previous presentation at which time SLP was concerned about discomfort from NGT. Although this has since been removed, throat clearing is still present, but does not appear to be directly related to swallowing. Her voice is only mildly rough and she appears to have been tolerating current diet. Her mentation seems to be the biggest barrier to adequate nutritional intake at this time, although per RD note, intake has been improving. Will continue to follow briefly.   HPI HPI: Betty [name] is a 73 y.o. female admitted 7/9 with medical history significant of tobacco dependence; COPD, CAD; non-small cell lung CA (2016); HTN; HLD: dementia; COPD; stage IV CKD; and chronic combined CHF presenting with AMS. Intubated 7/10-7/13 and reintubated 7/13-7/21.      SLP Plan  Continue with current plan of care       Recommendations  Diet recommendations: Dysphagia 3 (mechanical soft);Thin liquid Liquids provided via: Cup;Straw Medication Administration: Whole meds with puree Supervision: Full supervision/cueing for compensatory strategies Compensations: Slow rate;Small sips/bites Postural Changes and/or Swallow Maneuvers: Seated upright 90 degrees                Oral Care Recommendations: Oral care BID Follow up Recommendations: Skilled Nursing facility SLP Visit Diagnosis: Dysphagia, unspecified (R13.10) Plan: Continue with current plan of care       GO                Venita Sheffield [facility] 06/08/2019, 3:12 PM  Pollyann Glen, M.A. [place] Acute Environmental education officer [phone] Office [phone]

## 2019-06-08 NOTE — Progress Notes (Signed)
Physical Therapy Treatment Patient Details Name: Betty [name] MRN: [id number] DOB: Mar 16, 1946 Today's Date: 06/08/2019    History of Present Illness Pt is a 73 y.o. female admitted 05/13/19 with AMS and weakness; diagnosed with COVID-19 oin 7/9. ETT 7/10-7/13, reintubated 7/13- 7/21; transfer out of ICU 7/22. Returned to ICU 7/23 with worsening encephalopathy. Plan to transfer to floor 7/24. PMH includes CAD, right ruptured cerebral aneurysm,non-small cell lung CA, HTN, COPD, CKD IV, CHF, dementia., PPM, R L L lobectomy., NSTEMI, afib    PT Comments    The patient stood x 3 today with RW and 2 assisting. Patient able to take small pivot steps to [facility] and then to recliner. The  Pt. Requires encouragement to participate. Continure PT  For mobility, may be able to ambulate a short distance soon.   Follow Up Recommendations  SNF;Supervision/Assistance - 24 hour     Equipment Recommendations  None recommended by PT    Recommendations for Other Services       Precautions / Restrictions Precautions Precautions: Fall Precaution Comments:  incontinent on bowel Restrictions Weight Bearing Restrictions: No    Mobility  Bed Mobility Overal bed mobility: Needs Assistance Bed Mobility: Rolling Rolling: Max assist Sidelying to sit: Max assist;+2 for physical assistance;HOB elevated;+2 for safety/equipment Supine to sit: Max assist Sit to supine: Mod assist   General bed mobility comments: Max A to bring BLEs towards EOB and then elevate trunk; more for cognition. Pt able to return to supine with Mod A for elevate BLEs  Transfers Overall transfer level: Needs assistance Equipment used: Rolling walker (2 wheeled);None Transfers: Sit to/from [facility] Sit to Stand: Mod assist;+2 physical assistance;+2 safety/equipment Stand pivot transfers: Max assist;+2 physical assistance;+2 safety/equipment Squat pivot transfers: Total assist     General transfer comment: Mod A +2 for  power up into standing. Max A +2 for sequencing of pivot and management of RW. First stand pivot to [facility] without RW and then second pivot to recliner with RW  Ambulation/Gait                 Stairs             Wheelchair Mobility    Modified Rankin (Stroke Patients Only)       Balance Overall balance assessment: Independent Sitting-balance support: Bilateral upper extremity supported Sitting balance-Leahy Scale: Fair Sitting balance - Comments: Pt sitting at EOB with Min Guard A     Standing balance-Leahy Scale: Poor Standing balance comment: Reliant on UE support nad physical A                            Cognition Arousal/Alertness: Lethargic(required extra effort to arouse the  pt.) Behavior During Therapy: Restless;Impulsive Overall Cognitive Status: Impaired/Different from baseline Area of Impairment: Orientation;Attention;Memory;Following commands;Safety/judgement;Awareness;Problem solving                 Orientation Level: Disoriented to;Place;Time;Situation Current Attention Level: Sustained Memory: Decreased short-term memory Following Commands: Follows one step commands inconsistently Safety/Judgement: Decreased awareness of safety;Decreased awareness of deficits Awareness: Intellectual Problem Solving: Slow processing;Decreased initiation;Difficulty sequencing;Requires verbal cues;Requires tactile cues General Comments: Pt slightly lethargic and sleepy this session. uponarrival, pt sidelying in bed with bowel incontience. Requiring Max cues for safety and participation in functional transfers [place].      Exercises      General Comments General comments (skin integrity, edema, etc.): HR 130s, RR 30s, and SpO2 >96% on RA during  activity      Pertinent Vitals/Pain Pain Assessment: Faces Faces Pain Scale: No hurt Pain Intervention(s): Monitored during session    Home Living                      Prior Function             PT Goals (current goals can now be found in the care plan section) Acute Rehab PT Goals Patient Stated Goal: none stated    Frequency    Min 2X/week      PT Plan Current plan remains appropriate    Co-evaluation PT/OT/SLP Co-Evaluation/Treatment: Yes Reason for Co-Treatment: For patient/therapist safety PT goals addressed during session: Mobility/safety with mobility OT goals addressed during session: ADL's and self-care      AM-PAC PT "6 Clicks" Mobility   Outcome Measure  Help needed turning from your back to your side while in a flat bed without using bedrails?: Total Help needed moving from lying on your back to sitting on the side of a flat bed without using bedrails?: Total Help needed moving to and from a bed to a chair (including a wheelchair)?: Total Help needed standing up from a chair using your arms (e.g., wheelchair or bedside chair)?: Total Help needed to walk in hospital room?: Total Help needed climbing 3-5 steps with a railing? : Total 6 Click Score: 6    End of Session   Activity Tolerance: Patient tolerated treatment well Patient left: in chair;with call bell/phone within reach;with chair alarm set Nurse Communication: Mobility status PT Visit Diagnosis: Other abnormalities of gait and mobility (R26.89);Muscle weakness (generalized) (M62.81)     Time: 8592-9244 PT Time Calculation (min) (ACUTE ONLY): 23 min  Charges:  $Therapeutic Activity: 8-22 mins                     Tresa Endo PT Acute Rehabilitation Services  Office [phone]    [name] 06/08/2019, 11:12 AM

## 2019-06-08 NOTE — Progress Notes (Signed)
Called to update son Betty [name], no new concerns.

## 2019-06-08 NOTE — Progress Notes (Signed)
Occupational Therapy Treatment Patient Details Name: Betty [name] MRN: [id number] DOB: 1946-08-15 Today's Date: 06/08/2019    History of present illness Pt is a 73 y.o. female admitted 05/13/19 with AMS and weakness; diagnosed with COVID-19 oin 7/9. ETT 7/10-7/13, reintubated 7/13- 7/21; transfer out of ICU 7/22. Returned to ICU 7/23 with worsening encephalopathy. Plan to transfer to floor 7/24. PMH includes CAD, right ruptured cerebral aneurysm,non-small cell lung CA, HTN, COPD, CKD IV, CHF, dementia., PPM, R L L lobectomy., NSTEMI, afib   OT comments  Pt progressing towards established OT goals. Upon arrival, pt was sidelying in bed with bowel incontinence; pt slightly lethargic and sleepy requiring Max cues for encouragement and engagment. Pt requiring Max-Total A for toilet hygiene. Pt performing stand pivot to [facility] and then recliner with Max A +2. HR 130s, RR 30, and SpO2 >96% on RA during activity. Continue to recommend dc to SNF and will continue to follow acutely as admitted.   Follow Up Recommendations  SNF;Supervision/Assistance - 24 hour    Equipment Recommendations  Other (comment)(TBD in next venue)    Recommendations for Other Services      Precautions / Restrictions Precautions Precautions: Fall Precaution Comments:  incontinent on bowel Restrictions Weight Bearing Restrictions: No       Mobility Bed Mobility Overal bed mobility: Needs Assistance Bed Mobility: Sit to Supine;Supine to Sit     Supine to sit: Max assist Sit to supine: Mod assist   General bed mobility comments: Max A to bring BLEs towards EOB and then elevate trunk; more for cognition. Pt able to return to supine with Mod A for elevate BLEs  Transfers Overall transfer level: Needs assistance Equipment used: Rolling walker (2 wheeled);None Transfers: Sit to/from [facility] Sit to Stand: Mod assist;+2 physical assistance;+2 safety/equipment Stand pivot transfers: Max assist;+2  physical assistance;+2 safety/equipment       General transfer comment: Mod A +2 for power up into standing. Max A +2 for sequencing of pivot and management of RW. First stand pivot to [facility] without RW and then second pivot to recliner with RW    Balance Overall balance assessment: Needs assistance Sitting-balance support: Bilateral upper extremity supported Sitting balance-Leahy Scale: Fair Sitting balance - Comments: Pt sitting at EOB with Min Guard A     Standing balance-Leahy Scale: Poor Standing balance comment: Reliant on UE support nad physical A                           ADL either performed or assessed with clinical judgement   ADL Overall ADL's : Needs assistance/impaired Eating/Feeding: Minimal assistance;Sitting;Cueing for sequencing Eating/Feeding Details (indicate cue type and reason): Max cues to inititate eating breakfast             Upper Body Dressing : Maximal assistance;Sitting Upper Body Dressing Details (indicate cue type and reason): Max A for donning new gown Lower Body Dressing: Maximal assistance;+2 for physical assistance;Sit to/from stand Lower Body Dressing Details (indicate cue type and reason): Max A to don socks as pt held out her feet to therapist.  Toilet Transfer: Moderate assistance;+2 for physical assistance;+2 for safety/equipment;RW;Stand-pivot;Maximal assistance(with and without RW) Toilet Transfer Details (indicate cue type and reason): Mod A +2 for power up into standing and then Max A +2 for sequencing pivot and then controling RW Toileting- Clothing Manipulation and Hygiene: Sit to/from stand;+2 for physical assistance;Maximal assistance;Bed level;Total assistance;+2 for safety/equipment Toileting - Clothing Manipulation Details (indicate cue type  and reason): Total A for cleaning at bedlevel. Pt standing with Max A and then second person to perform toilet hygiene at [facility]     Functional mobility during ADLs: Moderate  assistance;Maximal assistance;+2 for physical assistance;+2 for safety/equipment;Rolling walker(One stand pivot with Max A +2 and then second with RW) General ADL Comments: Pt with bowel incontience in bed. Total A to clean at bedlevel. Max A +2 for pivot to [facility] for pt to finish BM.  Pt then requiring Max A +2 and RW to pivot to recliner for breakfast     Vision       Perception     Praxis      Cognition Arousal/Alertness: Awake/alert Behavior During Therapy: Restless;Impulsive Overall Cognitive Status: Impaired/Different from baseline Area of Impairment: Orientation;Attention;Memory;Following commands;Safety/judgement;Awareness;Problem solving                 Orientation Level: Disoriented to;Place;Time;Situation Current Attention Level: Sustained Memory: Decreased short-term memory Following Commands: Follows one step commands inconsistently Safety/Judgement: Decreased awareness of safety;Decreased awareness of deficits Awareness: Intellectual Problem Solving: Slow processing;Decreased initiation;Difficulty sequencing;Requires verbal cues;Requires tactile cues General Comments: Pt slightly lethargic and sleepy this session. uponarrival, pt sidelying in bed with bowel incontience. Requiring Max cues for safety and participation in functional transfers [place].        Exercises     Shoulder Instructions       General Comments HR 130s, RR 30s, and SpO2 >96% on RA during activity    Pertinent Vitals/ Pain       Pain Assessment: Faces Faces Pain Scale: No hurt Pain Intervention(s): Monitored during session  Home Living                                          Prior Functioning/Environment              Frequency  Min 2X/week        Progress Toward Goals  OT Goals(current goals can now be found in the care plan section)  Progress towards OT goals: Progressing toward goals  Acute Rehab OT Goals Patient Stated Goal: none stated OT Goal  Formulation: Patient unable to participate in goal setting Time For Goal Achievement: 06/12/19 Potential to Achieve Goals: Fair ADL Goals Pt Will Perform Grooming: with min guard assist;sitting Pt Will Perform Upper Body Bathing: with min assist;sitting Pt Will Perform Upper Body Dressing: with min assist;sitting Pt Will Transfer to Toilet: with mod assist;stand pivot transfer;bedside commode Pt Will Perform Toileting - Clothing Manipulation and hygiene: with mod assist;sit to/from stand Pt/caregiver will Perform Home Exercise Program: Increased ROM;Increased strength;Both right and left upper extremity;With minimal assist;With written HEP provided Additional ADL Goal #1: Pt will follow 1 step commands with 75% accuracy during functional tasks. Additional ADL Goal #2: Pt will sustain attention to functional task >5 min without cues.  Plan Discharge plan remains appropriate    Co-evaluation    PT/OT/SLP Co-Evaluation/Treatment: Yes Reason for Co-Treatment: Complexity of the patient's impairments (multi-system involvement);For patient/therapist safety;To address functional/ADL transfers   OT goals addressed during session: ADL's and self-care      AM-PAC OT "6 Clicks" Daily Activity     Outcome Measure   Help from another person eating meals?: A Lot Help from another person taking care of personal grooming?: A Little Help from another person toileting, which includes using toliet, bedpan, or urinal?: A Lot Help from another  person bathing (including washing, rinsing, drying)?: Total Help from another person to put on and taking off regular upper body clothing?: A Lot Help from another person to put on and taking off regular lower body clothing?: A Lot 6 Click Score: 12    End of Session Equipment Utilized During Treatment: Rolling walker  OT Visit Diagnosis: Muscle weakness (generalized) (M62.81);Other symptoms and signs involving cognitive function;Unsteadiness on feet (R26.81)    Activity Tolerance Patient tolerated treatment well;Patient limited by fatigue   Patient Left with call bell/phone within reach;in chair;with chair alarm set   Nurse Communication Mobility status([place])        Time: 4103-0131 OT Time Calculation (min): 30 min  Charges: OT General Charges $OT Visit: 1 Visit OT Treatments $Self Care/Home Management : 8-22 mins  [place], OTR/L Acute Rehab Pager: [phone] Office: [place] 06/08/2019, 10:29 AM

## 2019-06-08 NOTE — Progress Notes (Signed)
 Nutrition Follow-up  RD working remotely.  DOCUMENTATION CODES:   Not applicable  INTERVENTION:   Feeding assistance and encouragement at meal times. Encourage fluid intake  Ensure Enlive po TID, each supplement provides 350 kcal and 20 grams of protein  Pt receiving Hormel Shake daily with Breakfast which provides 520 kcals and 22 g of protein and Magic cup BID with lunch and dinner, each supplement provides 290 kcal and 9 grams of protein, automatically on meal trays to optimize nutritional intake.   Add MVI with Minerals  NUTRITION DIAGNOSIS:   Inadequate oral intake related to acute illness as evidenced by NPO status.  Being addressed via supplements  GOAL:   Patient will meet greater than or equal to 90% of their needs  Progressing  MONITOR:   Vent status, TF tolerance, Labs, Weight trends, Skin  REASON FOR ASSESSMENT:   Consult, Ventilator Enteral/tube feeding initiation and management  ASSESSMENT:   73 yo female admitted with severe acute respiratory failure from COVID-19 pneumonia requiring intubation, AKI/CKD IV.  PMH includes COPD, CKD IV, CHF, dementia, HTN, HLD, non-small cell lung ca in 2016, CAD.  7/09 Admit 7/10 Transferred to ICU and Intubated 7/13 Extubated; Re-Intubated 7/21 Extubated 7/22 Cortrak placed, TF initiated 7/28 SLP advanced diet to Dysphagia II, Nectar 7/29 Changed to nocturnal TF to promote po intake 8/01 Cortrak removed, TF discontinued  PO intake appears to be improving; pt ate 45% at breakfast this AM; 75-85% of meals yesterday.   Hypernatremic; D5-NS at 75 ml/hr. Encourage fluid intake by mouth  Pt started on Remeron on 8/2  Labs: sodium 149 (H), Creatinine 2.67, BUN 66 Meds: remeron   Diet Order:   Diet Order            DIET DYS 3 Room service appropriate? Yes; Fluid consistency: Thin  Diet effective now              EDUCATION NEEDS:   Not appropriate for education at this time  Skin:  Skin Assessment:  Skin Integrity Issues: Skin Integrity Issues:: Stage II Stage II: coccyx  Last BM:  8/2  Height:   Ht Readings from Last 1 Encounters:  05/18/19 5\' 4"  (1.626 m)    Weight:   Wt Readings from Last 1 Encounters:  06/04/19 46.3 kg    Ideal Body Weight:  54.5 kg  BMI:  Body mass index is 17.52 kg/m.  Estimated Nutritional Needs:   Kcal:  1650-1850 kcals  Protein:  83-93 g  Fluid:  >/= 1.7 L    Klay Sobotka MS, RDN, LDN, CNSC [phone] Pager  [phone] Weekend/On-Call Pager

## 2019-06-08 NOTE — Progress Notes (Signed)
PROGRESS NOTE    Betty [name]  YQM:[id number] DOB: 1946-04-06 DOA: 05/13/2019 PCP: Josetta Huddle, MD   Brief Narrative:  73 year old BF PMHx dementia, CAD, NSCLC in 2016, HTN, HLD COPD, kidney stage IV, hronic kidney disease stage IV, chronic systolic and diastolic CHF. Pt brought to the hospital by the patient's son due to increased confusion and weakness. She was diagnosed with coronavirus in the ED on 7/9. She was also found to be hypoxic requiring 2 L nasal cannula. She was admitted to the hospital and placed on steroids along with Remdesivir, deferred to ICU on 7/10, where she required intubation, she was extubated on 7/13, where she was reintubated that same day due to encephalopathy, was extubated 7/21   Subjective: Oral intake improving.  No further diarrhea.  Patient is actually thirsty and wants to drink by mouth.  No nausea no vomiting.   Assessment & Plan:   Principal Problem:   COVID-19 Active Problems:   Non-small cell carcinoma of right lung, stage 1 (HCC)   CKD (chronic kidney disease), stage IV (HCC)   Chronic systolic heart failure (HCC)   Dementia (HCC)   Essential hypertension   Acute respiratory failure with hypoxemia (HCC)   Pressure injury of skin   Altered mental status   Acute respiratory failure with hypoxia/COVID 19 pneumonia -Completed course of Remdesivir, Actemra -Intubated 7/10, extubated 7/13 with subsequent reintubation that same day: successfully extubated 7/21 -DuoNeb every 6 hours No results for input(s): DDIMER, FERRITIN, LDH, CRP in the last 72 hours.  Acute metabolic encephalopathy, likely in the setting of sepsis with UTI, likely POA septic shock ruled out -Continue ceftriaxone empirically -Cultures remarkable for 70,000 colonies of gram-negative rods, likely E. coli given history -Mental status appears to be improving per nursing at bedside and compared to previous documentation -Patient remains alert and oriented.  Monitor.   Chronic  systolic and diastolic CHF -EF 30 to 52%, not in acute exacerbation -Strict in and out/Daily weight Filed Weights   06/02/19 0330 06/03/19 0500 06/04/19 0343  Weight: 57.1 kg 54.5 kg 46.3 kg  - June 2020 echocardiogram EF 30 to 35%.    Newly diagnosed/new onset: Paroxysmal A. fib with RVR, rate controlled - Continue home metoprolol - Briefly on amiodarone drip but now discontinued. - On Lovenox - Patient is likely high risk for falls, recent GI bleed, advanced age and not a candidate for anticoagulation. Recommend full dose aspirin at discharge point - can follow with PCP for further evaluation and treatment.  Lower GI bleed, resolved - During his hospitalization patient noted to have bloody stools, gastric lavage did not show any blood in the stomach - not a candidate for endoscopy per previous documentation. - status post 1 u PRBC previously - Currently blood noticed around Flexi-Seal and watery fluid within her gastric bag possibly positive for blood. - 7/26 fecal occult blood negative  - H/H stable  Symptomatic Hypernatremia, resolving Recent Labs  Lab 06/04/19 0805 06/05/19 0156 06/06/19 0145 06/07/19 0325 06/08/19 0320  NA 142 142 141 142 149*   Hypokalemia, resolved ?Hyperkalemia vs hemolyzed blood samples -Potassium goal> 4  Acute on CKD stage IV (baseline Cr 2- 3) Recent Labs  Lab 06/02/19 0659 06/02/19 0913 06/03/19 8413 06/04/19 0805 06/05/19 0156 06/06/19 0145 06/07/19 0325 06/08/19 0320  CREATININE 2.26* 2.36* 2.32* 2.25* 2.42* 2.09* 2.16* 2.67*  - Near baseline  Prediabetes vs hyperglycemia - 7/1 hemoglobin A1c= 6.9 -Prior EMR has had multiple episodes of steroids in the past 1  to 2 months for acute gout as well as COVID.  Patient now off steroids Monitor.  Dementia - Discussed case with PCCM staff patient's baseline is; able to care for herself independently  Hx NSCLC/COPD -S/p RLL lobectomy + 4 cycles of adjuvant chemotherapy -Lung  cancer appears to be in remission per EMR   Hx gout  -Per EMR last 1 to 78-month treated for acute gout with steroids.  Does not take steroids on a regular basis -7/27 uric acid= 6.1  Nutrition -7/27 requested speech reevaluate patient's dysphagia.  Speech recommends dysphagia 2  nectar thick liquid -7/28 speech is cleared patient for diet however patient's caloric intake extremely poor right now do not believe she is meeting her requirements through p.o. intake. - 7/29 initiate night tube feedings until patient can tolerate PO adequately enough to maintain caloric intake -I 06/06/2023 cor track discontinued.  Speech therapy cleared the patient for dysphagia type III diet.  Will monitor overall intake over the weekend. Add Remeron for improvement of his appetite.  DVT prophylaxis: Lovenox Code Status: DNR Family Communication: None.   Disposition Plan: Likely to return to SNF pending improvement in oral intake.  If oral intake does not improve may require clarification of goals of care.  Consultants:  PCCM  Procedures/Significant Events:  6/17 echocardiogram:Left Ventricle: The left ventricle has moderate-severely reduced systolic function. -EF 30-35%.  -Left ventricular diffuse hypokinesis.  I have personally reviewed and interpreted all radiology studies and my findings are as above.  Cultures 7/13 MRSA by PCR negative 7/17 tracheal aspirate normal respiratory flora 7/23 blood LEFT hand negative 7/23 blood RIGHT hand negative 7/26 urine positive gram-negative rods   Antimicrobials: Anti-infectives (From admission, onward)   Start     Stop   06/02/19 1800  cefTRIAXone (ROCEPHIN) 2 g in sodium chloride 0.9 % 100 mL IVPB         05/22/19 1130  vancomycin (VANCOCIN) 1,250 mg in sodium chloride 0.9 % 250 mL IVPB     05/22/19 1302   05/22/19 1057  vancomycin variable dose per unstable renal function (pharmacist dosing)  Status:  Discontinued     05/24/19 1406   05/19/19 1030   piperacillin-tazobactam (ZOSYN) IVPB 2.25 g     05/25/19 2359   05/15/19 1230  remdesivir 100 mg in sodium chloride 0.9 % 250 mL IVPB     05/18/19 1340   05/14/19 1230  remdesivir 200 mg in sodium chloride 0.9 % 250 mL IVPB     05/14/19 1551     Continuous Infusions: . sodium chloride Stopped (05/27/19 0654)    Objective: Vitals:   06/08/19 0432 06/08/19 0747 06/08/19 1215 06/08/19 1640  BP: 130/77 (!) 151/80 108/85 135/86  Pulse:  (!) 105  (!) 122  Resp:    20  Temp: 97.6 F (36.4 C) 97.6 F (36.4 C) (!) 97.3 F (36.3 C) 98.3 F (36.8 C)  TempSrc: Axillary Axillary Oral Oral  SpO2: 95% 99% 98% 98%  Weight:      Height:        Intake/Output Summary (Last 24 hours) at 06/08/2019 1746 Last data filed at 06/08/2019 1600 Gross per 24 hour  Intake 1063 ml  Output -  Net 1063 ml   Filed Weights   06/02/19 0330 06/03/19 0500 06/04/19 0343  Weight: 57.1 kg 54.5 kg 46.3 kg   .Physical Exam:  General: Awake, alert, AO to person, place and general situation - incorrect month but correct year Eyes: negative scleral hemorrhage, negative anisocoria,  negative icterus ENT: Negative Runny nose, negative gingival bleeding, Neck:  Negative scars, masses, torticollis, lymphadenopathy, JVD Lungs: Clear to auscultation bilaterally without wheezes or crackles Cardiovascular: Regular rate and rhythm without murmur gallop or rub normal S1 and S2 Abdomen: negative abdominal pain, nondistended, positive soft, bowel sounds, no rebound, no ascites, no appreciable mass Extremities: No significant cyanosis, clubbing, or edema bilateral lower extremities Skin: Negative rashes, lesions, ulcers Psychiatric: Unable to evaluate secondary to patient's refusal to answer questions   Central nervous system: Spontaneously moves all extremities, withdraws to painful stimuli   Data Reviewed: Care during the described time interval was provided by me .  I have reviewed this patient's available data, including  medical history, events of note, physical examination, and all test results as part of my evaluation.   CBC: Recent Labs  Lab 06/04/19 0805 06/05/19 0156 06/06/19 0145 06/07/19 0325 06/08/19 0320  WBC 10.3 9.6 9.5 11.1* 12.8*  NEUTROABS 7.3 6.6 6.6 8.2* 8.7*  HGB 12.6 13.0 12.3 11.8* 12.3  HCT 39.1 40.0 38.4 37.0 37.7  MCV 96.8 96.2 97.2 97.1 97.2  PLT 249 254 239 278 322   Basic Metabolic Panel: Recent Labs  Lab 06/02/19 0659 06/02/19 0913 06/03/19 0828 06/04/19 0805 06/05/19 0156 06/06/19 0145 06/07/19 0325 06/08/19 0320  NA 141 141 139 142 142 141 142 149*  K 5.8* 5.8* 5.1 5.1 4.9 5.0 4.6 4.4  CL 107 106 103 106 107 107 109 114*  CO2 22 25 22 23 24  21* 23 22  GLUCOSE 120* 142* 99 89 150* 118* 93 120*  BUN 58* 62* 70* 67* 67* 57* 47* 66*  CREATININE 2.26* 2.36* 2.32* 2.25* 2.42* 2.09* 2.16* 2.67*  CALCIUM 9.2 9.4 9.1 9.4 9.6 9.1 9.4 9.6  MG 2.1 2.2 2.1 2.2 2.1 2.0 2.0 2.1  PHOS 5.2* 5.2* 5.6* 5.3* 5.2*  --   --   --    GFR: Estimated Creatinine Clearance: 13.5 mL/min (A) (by C-G formula based on SCr of 2.67 mg/dL (H)). Liver Function Tests: Recent Labs  Lab 06/04/19 0805 06/05/19 0156 06/06/19 0145 06/07/19 0325 06/08/19 0320  AST 26 23 25 22 20   ALT 24 21 21 19 20   ALKPHOS 75 74 67 88 82  BILITOT 0.4 0.6 0.5 0.6 0.5  PROT 6.5 6.6 6.2* 6.5 6.7  ALBUMIN 3.1* 3.1* 2.9* 3.0* 2.9*   No results for input(s): LIPASE, AMYLASE in the last 168 hours. No results for input(s): AMMONIA in the last 168 hours. Coagulation Profile: No results for input(s): INR, PROTIME in the last 168 hours. Cardiac Enzymes: No results for input(s): CKTOTAL, CKMB, CKMBINDEX, TROPONINI in the last 168 hours. BNP (last 3 results) No results for input(s): PROBNP in the last 8760 hours. HbA1C: No results for input(s): HGBA1C in the last 72 hours. CBG: Recent Labs  Lab 06/07/19 0826 06/07/19 1232 06/07/19 1618 06/08/19 0744 06/08/19 1211  GLUCAP 98 142* 179* 124* 181*   Lipid  Profile: No results for input(s): CHOL, HDL, LDLCALC, TRIG, CHOLHDL, LDLDIRECT in the last 72 hours. Thyroid Function Tests: No results for input(s): TSH, T4TOTAL, FREET4, T3FREE, THYROIDAB in the last 72 hours. Anemia Panel: No results for input(s): VITAMINB12, FOLATE, FERRITIN, TIBC, IRON, RETICCTPCT in the last 72 hours. Urine analysis:    Component Value Date/Time   COLORURINE YELLOW 05/21/2019 [place] 05/21/2019 1541   LABSPEC 1.012 05/21/2019 1541   PHURINE 5.0 05/21/2019 1541   GLUCOSEU NEGATIVE 05/21/2019 1541   HGBUR MODERATE (A) 05/21/2019  [place] 05/21/2019 [place] 05/21/2019 1541   PROTEINUR NEGATIVE 05/21/2019 1541   UROBILINOGEN 0.2 04/27/2015 0834   NITRITE NEGATIVE 05/21/2019 1541   LEUKOCYTESUR NEGATIVE 05/21/2019 1541   Sepsis Labs: @LABRCNTIP (procalcitonin:4,lacticidven:4)  ) Recent Results (from the past 240 hour(s))  Culture, Urine     Status: Abnormal   Collection Time: 05/31/19  4:24 AM   Specimen: Urine, Random  Result Value Ref Range Status   Specimen Description   Final    URINE, RANDOM Performed at [facility], [place] [address] [place], [place] [address]    Special Requests   Final    NONE Performed at [facility], [place] [address], [place], [place] [address]    Culture (A)  Final    >=100,000 COLONIES/mL ESCHERICHIA COLI Two isolates with different morphologies were identified as the same organism.The most resistant organism was reported. Performed at [facility], [place] [address], [place], [place] [id number]    Report Status 06/04/2019 FINAL  Final   Organism ID, Bacteria ESCHERICHIA COLI (A)  Final      Susceptibility   Escherichia coli - MIC*    AMPICILLIN >=32 RESISTANT Resistant     CEFAZOLIN <=4 SENSITIVE Sensitive     CEFTRIAXONE <=1 SENSITIVE Sensitive     CIPROFLOXACIN <=0.25 SENSITIVE Sensitive     GENTAMICIN <=1 SENSITIVE Sensitive      IMIPENEM <=0.25 SENSITIVE Sensitive     NITROFURANTOIN <=16 SENSITIVE Sensitive     TRIMETH/SULFA <=20 SENSITIVE Sensitive     AMPICILLIN/SULBACTAM 16 INTERMEDIATE Intermediate     Extended ESBL NEGATIVE Sensitive     * >=100,000 COLONIES/mL ESCHERICHIA COLI     Radiology Studies: No results found.  Scheduled Meds: . carvedilol  6.25 mg Oral BID WC  . chlorhexidine  15 mL Mouth Rinse BID  . Chlorhexidine Gluconate Cloth  6 each Topical Daily  . enoxaparin (LOVENOX) injection  50 mg Subcutaneous Q24H  . feeding supplement (ENSURE ENLIVE)  237 mL Oral TID BM  . feeding supplement (PRO-STAT SUGAR FREE 64)  30 mL Oral Daily  . Ipratropium-Albuterol  1 puff Inhalation Q6H  . mouth rinse  15 mL Mouth Rinse q12n4p  . mirtazapine  7.5 mg Oral QHS  . multivitamin with minerals  1 tablet Oral Daily  . pantoprazole  40 mg Oral Daily  . sodium chloride flush  3 mL Intravenous Q12H   Continuous Infusions: . sodium chloride Stopped (05/27/19 0654)     LOS: 25 days   Time spent: 40 minutes   Berle Mull, MD [facility]  If 7PM-7AM, please contact night-coverage [email or web address] Password [email or web address] 06/08/2019, 5:46 PM

## 2019-06-09 LAB — BRAIN NATRIURETIC PEPTIDE: B Natriuretic Peptide: 80.7 pg/mL (ref 0.0–100.0)

## 2019-06-09 LAB — CBC WITH DIFFERENTIAL/PLATELET
Abs Immature Granulocytes: 0.1 10*3/uL — ABNORMAL HIGH (ref 0.00–0.07)
Basophils Absolute: 0.1 10*3/uL (ref 0.0–0.1)
Basophils Relative: 1 %
Eosinophils Absolute: 0.2 10*3/uL (ref 0.0–0.5)
Eosinophils Relative: 2 %
HCT: 36.1 % (ref 36.0–46.0)
Hemoglobin: 11.6 g/dL — ABNORMAL LOW (ref 12.0–15.0)
Immature Granulocytes: 1 %
Lymphocytes Relative: 24 %
Lymphs Abs: 2.9 10*3/uL (ref 0.7–4.0)
MCH: 31.1 pg (ref 26.0–34.0)
MCHC: 32.1 g/dL (ref 30.0–36.0)
MCV: 96.8 fL (ref 80.0–100.0)
Monocytes Absolute: 1.1 10*3/uL — ABNORMAL HIGH (ref 0.1–1.0)
Monocytes Relative: 9 %
Neutro Abs: 7.5 10*3/uL (ref 1.7–7.7)
Neutrophils Relative %: 63 %
Platelets: 331 10*3/uL (ref 150–400)
RBC: 3.73 MIL/uL — ABNORMAL LOW (ref 3.87–5.11)
RDW: 17.2 % — ABNORMAL HIGH (ref 11.5–15.5)
WBC: 11.9 10*3/uL — ABNORMAL HIGH (ref 4.0–10.5)
nRBC: 0 % (ref 0.0–0.2)

## 2019-06-09 LAB — COMPREHENSIVE METABOLIC PANEL
ALT: 21 U/L (ref 0–44)
AST: 21 U/L (ref 15–41)
Albumin: 2.8 g/dL — ABNORMAL LOW (ref 3.5–5.0)
Alkaline Phosphatase: 73 U/L (ref 38–126)
Anion gap: 13 (ref 5–15)
BUN: 66 mg/dL — ABNORMAL HIGH (ref 8–23)
CO2: 21 mmol/L — ABNORMAL LOW (ref 22–32)
Calcium: 9.4 mg/dL (ref 8.9–10.3)
Chloride: 112 mmol/L — ABNORMAL HIGH (ref 98–111)
Creatinine, Ser: 2.58 mg/dL — ABNORMAL HIGH (ref 0.44–1.00)
GFR calc Af Amer: 20 mL/min — ABNORMAL LOW (ref >60)
GFR calc non Af Amer: 18 mL/min — ABNORMAL LOW (ref >60)
Glucose, Bld: 112 mg/dL — ABNORMAL HIGH (ref 70–99)
Potassium: 4.1 mmol/L (ref 3.5–5.1)
Sodium: 146 mmol/L — ABNORMAL HIGH (ref 135–145)
Total Bilirubin: 0.5 mg/dL (ref 0.3–1.2)
Total Protein: 6.5 g/dL (ref 6.5–8.1)

## 2019-06-09 LAB — MAGNESIUM: Magnesium: 2.1 mg/dL (ref 1.7–2.4)

## 2019-06-09 MED ORDER — OLANZAPINE 2.5 MG PO TABS
2.5000 mg | ORAL_TABLET | Freq: Every day | ORAL | Status: DC
  Administered 2019-06-09 – 2019-06-13 (×5): 2.5 mg via ORAL
  Filled 2019-06-09 (×6): qty 1

## 2019-06-09 MED ORDER — CARVEDILOL 3.125 MG PO TABS
18.7500 mg | ORAL_TABLET | Freq: Two times a day (BID) | ORAL | Status: DC
  Administered 2019-06-09 – 2019-06-10 (×3): 18.75 mg via ORAL
  Filled 2019-06-09 (×2): qty 2

## 2019-06-09 MED ORDER — CARVEDILOL 12.5 MG PO TABS
12.5000 mg | ORAL_TABLET | Freq: Two times a day (BID) | ORAL | Status: DC
  Administered 2019-06-09 (×2): 12.5 mg via ORAL

## 2019-06-09 MED ORDER — METOPROLOL TARTRATE 5 MG/5ML IV SOLN
5.0000 mg | Freq: Four times a day (QID) | INTRAVENOUS | Status: DC | PRN
  Administered 2019-06-09 – 2019-06-10 (×2): 5 mg via INTRAVENOUS
  Filled 2019-06-09 (×3): qty 5

## 2019-06-09 NOTE — Progress Notes (Signed)
PROGRESS NOTE    Betty [name]  CZY:[id number] DOB: 1946/09/05 DOA: 05/13/2019 PCP: Josetta Huddle, MD   Brief Narrative:  73 year old BF PMHx dementia, CAD, NSCLC in 2016, HTN, HLD COPD, kidney stage IV, hronic kidney disease stage IV, chronic systolic and diastolic CHF. Pt brought to the hospital by the patient's son due to increased confusion and weakness. She was diagnosed with coronavirus in the ED on 7/9. She was also found to be hypoxic requiring 2 L nasal cannula. She was admitted to the hospital and placed on steroids along with Remdesivir, deferred to ICU on 7/10, where she required intubation, she was extubated on 7/13, where she was reintubated that same day due to encephalopathy, was extubated 7/21   Subjective: Oral intake improving.  No diarrhea.  Patient is actually thirsty and wants to drink by mouth.  No nausea no vomiting.  Somewhat confused is not aware where she is and why she is in the hospital.   Assessment & Plan:   Principal Problem:   COVID-19 Active Problems:   Non-small cell carcinoma of right lung, stage 1 (HCC)   CKD (chronic kidney disease), stage IV (HCC)   Chronic systolic heart failure (HCC)   Dementia (HCC)   Essential hypertension   Acute respiratory failure with hypoxemia (HCC)   Pressure injury of skin   Altered mental status   Acute respiratory failure with hypoxia/COVID 19 pneumonia -Completed course of Remdesivir, Actemra -Intubated 7/10, extubated 7/13 with subsequent reintubation that same day: successfully extubated 7/21 Now stable and resolved   Acute metabolic encephalopathy, likely in the setting of sepsis with UTI, likely POA septic shock ruled out -Continue ceftriaxone empirically -Cultures remarkable for 70,000 colonies of gram-negative rods, likely E. coli given history -Mental status appears to be improving per nursing at bedside and compared to previous documentation Added remeron and zyprexa   Chronic systolic and diastolic  CHF -EF 30 to 09%, not in acute exacerbation -Strict in and out/Daily weight Filed Weights   06/02/19 0330 06/03/19 0500 06/04/19 0343  Weight: 57.1 kg 54.5 kg 46.3 kg  - June 2020 echocardiogram EF 30 to 35%.   Add coreg  Newly diagnosed/new onset: Paroxysmal A. fib with RVR, rate controlled - Continue coreg - Briefly on amiodarone drip but now discontinued. - On therapeutic Lovenox - Patient is likely high risk for falls, recent GI bleed, advanced age and not a candidate for anticoagulation. Recommend full dose aspirin at discharge point - can follow with PCP for further evaluation and treatment.  Lower GI bleed, resolved - During his hospitalization patient noted to have bloody stools, gastric lavage did not show any blood in the stomach - not a candidate for endoscopy per previous documentation. - status post 1 u PRBC previously - Currently blood noticed around Flexi-Seal and watery fluid within her gastric bag possibly positive for blood. - 7/26 fecal occult blood negative  - H/H stable  Symptomatic Hypernatremia, resolving Recent Labs  Lab 06/05/19 0156 06/06/19 0145 06/07/19 0325 06/08/19 0320 06/09/19 0355  NA 142 141 142 149* 146*   Hypokalemia, resolved ?Hyperkalemia vs hemolyzed blood samples -Potassium goal> 4  Acute on CKD stage IV (baseline Cr 2- 3) Recent Labs  Lab 06/03/19 0828 06/04/19 0805 06/05/19 0156 06/06/19 0145 06/07/19 0325 06/08/19 0320 06/09/19 0355  CREATININE 2.32* 2.25* 2.42* 2.09* 2.16* 2.67* 2.58*  - Near baseline  Prediabetes vs hyperglycemia - 7/1 hemoglobin A1c= 6.9 -Prior EMR has had multiple episodes of steroids in the past 1 to  2 months for acute gout as well as COVID.  Patient now off steroids Monitor.  Dementia - Discussed case with PCCM staff patient's baseline is; able to care for herself independently  Hx NSCLC/COPD -S/p RLL lobectomy + 4 cycles of adjuvant chemotherapy -Lung cancer appears to be in remission  per EMR   Hx gout  -Per EMR last 1 to 30-month treated for acute gout with steroids.  Does not take steroids on a regular basis -7/27 uric acid= 6.1  Nutrition -7/27 requested speech reevaluate patient's dysphagia.  Speech recommends dysphagia 2  nectar thick liquid -7/28 speech is cleared patient for diet however patient's caloric intake extremely poor right now do not believe she is meeting her requirements through p.o. intake. - 7/29 initiate night tube feedings until patient can tolerate PO adequately enough to maintain caloric intake - 06/06/2023 cor track discontinued.  Speech therapy cleared the patient for dysphagia type III diet.  Will monitor overall intake over the weekend. Add Remeron for improvement of his appetite.  DVT prophylaxis: Lovenox Code Status: DNR Family Communication: None.   Disposition Plan: to SNF,   Consultants:  PCCM  Procedures/Significant Events:  6/17 echocardiogram:Left Ventricle: The left ventricle has moderate-severely reduced systolic function. -EF 30-35%.  -Left ventricular diffuse hypokinesis.  I have personally reviewed and interpreted all radiology studies and my findings are as above.  Cultures 7/13 MRSA by PCR negative 7/17 tracheal aspirate normal respiratory flora 7/23 blood LEFT hand negative 7/23 blood RIGHT hand negative 7/26 urine positive gram-negative rods   Antimicrobials: Anti-infectives (From admission, onward)   Start     Stop   06/02/19 1800  cefTRIAXone (ROCEPHIN) 2 g in sodium chloride 0.9 % 100 mL IVPB         05/22/19 1130  vancomycin (VANCOCIN) 1,250 mg in sodium chloride 0.9 % 250 mL IVPB     05/22/19 1302   05/22/19 1057  vancomycin variable dose per unstable renal function (pharmacist dosing)  Status:  Discontinued     05/24/19 1406   05/19/19 1030  piperacillin-tazobactam (ZOSYN) IVPB 2.25 g     05/25/19 2359   05/15/19 1230  remdesivir 100 mg in sodium chloride 0.9 % 250 mL IVPB     05/18/19 1340    05/14/19 1230  remdesivir 200 mg in sodium chloride 0.9 % 250 mL IVPB     05/14/19 1551     Continuous Infusions: . sodium chloride 250 mL (06/08/19 2243)    Objective: Vitals:   06/08/19 1945 06/09/19 0450 06/09/19 0806 06/09/19 1629  BP: 131/79 121/80 134/81 126/84  Pulse: (!) 113 (!) 115 (!) 131 (!) 107  Resp: 20 20 (!) 22   Temp: 98.2 F (36.8 C) 97.8 F (36.6 C) 98 F (36.7 C) (!) 97.4 F (36.3 C)  TempSrc: Oral Oral Oral Oral  SpO2: 96% 90% 93% 94%  Weight:      Height:       No intake or output data in the 24 hours ending 06/09/19 1933 Filed Weights   06/02/19 0330 06/03/19 0500 06/04/19 0343  Weight: 57.1 kg 54.5 kg 46.3 kg   .Physical Exam:  General: Awake, alert, AO to person, place and general situation - incorrect month but correct year Eyes: negative scleral hemorrhage, negative anisocoria, negative icterus ENT: Negative Runny nose, negative gingival bleeding, Neck:  Negative scars, masses, torticollis, lymphadenopathy, JVD Lungs: Clear to auscultation bilaterally without wheezes or crackles Cardiovascular: Regular rate and rhythm without murmur gallop or rub normal S1 and  S2 Abdomen: negative abdominal pain, nondistended, positive soft, bowel sounds, no rebound, no ascites, no appreciable mass Extremities: No significant cyanosis, clubbing, or edema bilateral lower extremities Skin: Negative rashes, lesions, ulcers Psychiatric: Unable to evaluate secondary to patient's refusal to answer questions   Central nervous system: Spontaneously moves all extremities, withdraws to painful stimuli   Data Reviewed: Care during the described time interval was provided by me .  I have reviewed this patient's available data, including medical history, events of note, physical examination, and all test results as part of my evaluation.   CBC: Recent Labs  Lab 06/05/19 0156 06/06/19 0145 06/07/19 0325 06/08/19 0320 06/09/19 0355  WBC 9.6 9.5 11.1* 12.8* 11.9*   NEUTROABS 6.6 6.6 8.2* 8.7* 7.5  HGB 13.0 12.3 11.8* 12.3 11.6*  HCT 40.0 38.4 37.0 37.7 36.1  MCV 96.2 97.2 97.1 97.2 96.8  PLT 254 239 278 312 462   Basic Metabolic Panel: Recent Labs  Lab 06/03/19 0828 06/04/19 0805 06/05/19 0156 06/06/19 0145 06/07/19 0325 06/08/19 0320 06/09/19 0355  NA 139 142 142 141 142 149* 146*  K 5.1 5.1 4.9 5.0 4.6 4.4 4.1  CL 103 106 107 107 109 114* 112*  CO2 22 23 24  21* 23 22 21*  GLUCOSE 99 89 150* 118* 93 120* 112*  BUN 70* 67* 67* 57* 47* 66* 66*  CREATININE 2.32* 2.25* 2.42* 2.09* 2.16* 2.67* 2.58*  CALCIUM 9.1 9.4 9.6 9.1 9.4 9.6 9.4  MG 2.1 2.2 2.1 2.0 2.0 2.1 2.1  PHOS 5.6* 5.3* 5.2*  --   --   --   --    GFR: Estimated Creatinine Clearance: 14 mL/min (A) (by C-G formula based on SCr of 2.58 mg/dL (H)). Liver Function Tests: Recent Labs  Lab 06/05/19 0156 06/06/19 0145 06/07/19 0325 06/08/19 0320 06/09/19 0355  AST 23 25 22 20 21   ALT 21 21 19 20 21   ALKPHOS 74 67 88 82 73  BILITOT 0.6 0.5 0.6 0.5 0.5  PROT 6.6 6.2* 6.5 6.7 6.5  ALBUMIN 3.1* 2.9* 3.0* 2.9* 2.8*   No results for input(s): LIPASE, AMYLASE in the last 168 hours. No results for input(s): AMMONIA in the last 168 hours. Coagulation Profile: No results for input(s): INR, PROTIME in the last 168 hours. Cardiac Enzymes: No results for input(s): CKTOTAL, CKMB, CKMBINDEX, TROPONINI in the last 168 hours. BNP (last 3 results) No results for input(s): PROBNP in the last 8760 hours. HbA1C: No results for input(s): HGBA1C in the last 72 hours. CBG: Recent Labs  Lab 06/07/19 0826 06/07/19 1232 06/07/19 1618 06/08/19 0744 06/08/19 1211  GLUCAP 98 142* 179* 124* 181*   Lipid Profile: No results for input(s): CHOL, HDL, LDLCALC, TRIG, CHOLHDL, LDLDIRECT in the last 72 hours. Thyroid Function Tests: No results for input(s): TSH, T4TOTAL, FREET4, T3FREE, THYROIDAB in the last 72 hours. Anemia Panel: No results for input(s): VITAMINB12, FOLATE, FERRITIN, TIBC,  IRON, RETICCTPCT in the last 72 hours. Urine analysis:    Component Value Date/Time   COLORURINE YELLOW 05/21/2019 1541   APPEARANCEUR CLEAR 05/21/2019 1541   LABSPEC 1.012 05/21/2019 1541   PHURINE 5.0 05/21/2019 1541   GLUCOSEU NEGATIVE 05/21/2019 1541   HGBUR MODERATE (A) 05/21/2019 1541   BILIRUBINUR NEGATIVE 05/21/2019 1541   KETONESUR NEGATIVE 05/21/2019 1541   PROTEINUR NEGATIVE 05/21/2019 1541   UROBILINOGEN 0.2 04/27/2015 0834   NITRITE NEGATIVE 05/21/2019 1541   LEUKOCYTESUR NEGATIVE 05/21/2019 1541   Sepsis Labs: @LABRCNTIP (procalcitonin:4,lacticidven:4)  ) Recent Results (from the past  240 hour(s))  Culture, Urine     Status: Abnormal   Collection Time: 05/31/19  4:24 AM   Specimen: Urine, Random  Result Value Ref Range Status   Specimen Description   Final    URINE, RANDOM Performed at [facility] [address] [place], [place] [address]    Special Requests   Final    NONE Performed at [facility], [place] [address], [place], [place] [address]    Culture (A)  Final    >=100,000 COLONIES/mL ESCHERICHIA COLI Two isolates with different morphologies were identified as the same organism.The most resistant organism was reported. Performed at [facility], [place] [address], [place], [place] [address]    Report Status 06/04/2019 FINAL  Final   Organism ID, Bacteria ESCHERICHIA COLI (A)  Final      Susceptibility   Escherichia coli - MIC*    AMPICILLIN >=32 RESISTANT Resistant     CEFAZOLIN <=4 SENSITIVE Sensitive     CEFTRIAXONE <=1 SENSITIVE Sensitive     CIPROFLOXACIN <=0.25 SENSITIVE Sensitive     GENTAMICIN <=1 SENSITIVE Sensitive     IMIPENEM <=0.25 SENSITIVE Sensitive     NITROFURANTOIN <=16 SENSITIVE Sensitive     TRIMETH/SULFA <=20 SENSITIVE Sensitive     AMPICILLIN/SULBACTAM 16 INTERMEDIATE Intermediate     Extended ESBL NEGATIVE Sensitive     * >=100,000 COLONIES/mL ESCHERICHIA COLI     Radiology  Studies: No results found.  Scheduled Meds: . carvedilol  18.75 mg Oral BID WC  . chlorhexidine  15 mL Mouth Rinse BID  . Chlorhexidine Gluconate Cloth  6 each Topical Daily  . enoxaparin (LOVENOX) injection  50 mg Subcutaneous Q24H  . feeding supplement (ENSURE ENLIVE)  237 mL Oral TID BM  . feeding supplement (PRO-STAT SUGAR FREE 64)  30 mL Oral Daily  . Ipratropium-Albuterol  1 puff Inhalation Q6H  . mouth rinse  15 mL Mouth Rinse q12n4p  . mirtazapine  7.5 mg Oral QHS  . multivitamin with minerals  1 tablet Oral Daily  . OLANZapine  2.5 mg Oral Daily  . pantoprazole  40 mg Oral Daily  . sodium chloride flush  10-40 mL Intracatheter Q12H  . sodium chloride flush  3 mL Intravenous Q12H   Continuous Infusions: . sodium chloride 250 mL (06/08/19 2243)     LOS: 26 days   Time spent: 40 minutes   Berle Mull, MD [facility]  If 7PM-7AM, please contact night-coverage [email or web address] Password [facility] 06/09/2019, 7:33 PM

## 2019-06-09 NOTE — Care Management (Addendum)
Case manager contacted patient's son, Grier Czerwinski  -[phone], to offer choice for SNF . Chrissie Noa said he will go online and check out [facility], [facility], [employer] and [place] and will get back to me.  [phone] : Chrissie Noa returned call and said it would be fine to fax patient to the facilities listed. CM also explained that our options will be limited to his mom having COVID. A repeat test has been ordered.

## 2019-06-10 LAB — COMPREHENSIVE METABOLIC PANEL
ALT: 21 U/L (ref 0–44)
AST: 22 U/L (ref 15–41)
Albumin: 2.9 g/dL — ABNORMAL LOW (ref 3.5–5.0)
Alkaline Phosphatase: 73 U/L (ref 38–126)
Anion gap: 13 (ref 5–15)
BUN: 58 mg/dL — ABNORMAL HIGH (ref 8–23)
CO2: 21 mmol/L — ABNORMAL LOW (ref 22–32)
Calcium: 9.3 mg/dL (ref 8.9–10.3)
Chloride: 108 mmol/L (ref 98–111)
Creatinine, Ser: 2.63 mg/dL — ABNORMAL HIGH (ref 0.44–1.00)
GFR calc Af Amer: 20 mL/min — ABNORMAL LOW (ref >60)
GFR calc non Af Amer: 17 mL/min — ABNORMAL LOW (ref >60)
Glucose, Bld: 124 mg/dL — ABNORMAL HIGH (ref 70–99)
Potassium: 3.8 mmol/L (ref 3.5–5.1)
Sodium: 142 mmol/L (ref 135–145)
Total Bilirubin: 0.6 mg/dL (ref 0.3–1.2)
Total Protein: 6.6 g/dL (ref 6.5–8.1)

## 2019-06-10 LAB — CBC WITH DIFFERENTIAL/PLATELET
Abs Immature Granulocytes: 0.09 10*3/uL — ABNORMAL HIGH (ref 0.00–0.07)
Basophils Absolute: 0.1 10*3/uL (ref 0.0–0.1)
Basophils Relative: 1 %
Eosinophils Absolute: 0.2 10*3/uL (ref 0.0–0.5)
Eosinophils Relative: 2 %
HCT: 37.8 % (ref 36.0–46.0)
Hemoglobin: 12.3 g/dL (ref 12.0–15.0)
Immature Granulocytes: 1 %
Lymphocytes Relative: 26 %
Lymphs Abs: 3.2 10*3/uL (ref 0.7–4.0)
MCH: 31.5 pg (ref 26.0–34.0)
MCHC: 32.5 g/dL (ref 30.0–36.0)
MCV: 96.7 fL (ref 80.0–100.0)
Monocytes Absolute: 1.1 10*3/uL — ABNORMAL HIGH (ref 0.1–1.0)
Monocytes Relative: 9 %
Neutro Abs: 7.5 10*3/uL (ref 1.7–7.7)
Neutrophils Relative %: 61 %
Platelets: 349 10*3/uL (ref 150–400)
RBC: 3.91 MIL/uL (ref 3.87–5.11)
RDW: 17.1 % — ABNORMAL HIGH (ref 11.5–15.5)
WBC: 12.1 10*3/uL — ABNORMAL HIGH (ref 4.0–10.5)
nRBC: 0 % (ref 0.0–0.2)

## 2019-06-10 LAB — GLUCOSE, CAPILLARY
Glucose-Capillary: 117 mg/dL — ABNORMAL HIGH (ref 70–99)
Glucose-Capillary: 178 mg/dL — ABNORMAL HIGH (ref 70–99)

## 2019-06-10 LAB — MAGNESIUM: Magnesium: 2.1 mg/dL (ref 1.7–2.4)

## 2019-06-10 NOTE — Progress Notes (Signed)
ANTICOAGULATION CONSULT NOTE - Follow Up Consult  Pharmacy Consult for Lovenox Indication: atrial fibrillation  Allergies  Allergen Reactions  . Sulfa Antibiotics Itching    Patient Measurements: Height: 5\' 4"  (162.6 cm) Weight: 102 lb 1.2 oz (46.3 kg) IBW/kg (Calculated) : 54.7  Vital Signs: Temp: 98.1 F (36.7 C) (08/05 0813) Temp Source: Oral (08/05 0813) BP: 132/80 (08/05 0813) Pulse Rate: 108 (08/05 0813)  Labs: Recent Labs    06/08/19 0320 06/09/19 0355 06/10/19 0300  HGB 12.3 11.6* 12.3  HCT 37.7 36.1 37.8  PLT 312 331 349  CREATININE 2.67* 2.58* 2.63*    Estimated Creatinine Clearance: 13.7 mL/min (A) (by C-G formula based on SCr of 2.63 mg/dL (H)).   Assessment: 73 y.o. female admitted on 05/13/2019 with COVID-19 pneumonia, requiring intubation.  Patient developed atrial fibrillation (CHA2DS2/VAS= 5), started on amiodarone which was stopped, was being AV paced. Has been on VTE prophylaxis with SQ heparin, then developed GI bleed on 7/17 with Hgb decreased 7.5 for which she was transfused.  No evidence of further bleeding and Hgb better so full-dose Lovenox started for afib on 7/19, dose was held on 7/23, but resumed on 7/24.  FOB negative on 05/31/19.  Long term plan for anticoagulation is ASA rather than full anticoagulation. We will cont the current dose while she is here since the level was appropriate. SCr up to 2.63. Hgb and plts stable.  Goal of Therapy:  Anti-Xa level 0.6-1 units/ml (4 hours post dose) Monitor platelets by anticoagulation protocol: Yes   Plan:  Continue Lovenox 50 mg Jakin daily  CBC at least q 72h.  Monitor for bleeding   Elenor Quinones, PharmD, BCPS, BCIDP Clinical Pharmacist 06/10/2019 9:19 AM

## 2019-06-10 NOTE — Progress Notes (Signed)
  Speech Language Pathology Treatment: Dysphagia  Patient Details Name: Betty [name] MRN: [id number] DOB: [birth year] Today's Date: 06/10/2019 Time: 2549-8264 SLP Time Calculation (min) (ACUTE ONLY): 13 min  Assessment / Plan / Recommendation Clinical Impression  Pt remains very confused but self-feeds with no overt s/s of aspiration or throat clearing today. No cues are needed for safety, but she needs Mod cues for redirection to continue to eat and drink. Given her mentation, Dys 3 solids will likely be a good diet for her to maintain while she remains in the hospital. Supervision would be helpful for tray set-up, proper positioning, and to encourage adequate intake. SLP to sign off acutely. Please reorder if we can assist further.    HPI HPI: Betty [name] is a 73 y.o. female admitted 7/9 with medical history significant of tobacco dependence; COPD, CAD; non-small cell lung CA (2016); HTN; HLD: dementia; COPD; stage IV CKD; and chronic combined CHF presenting with AMS. Intubated 7/10-7/13 and reintubated 7/13-7/21.      SLP Plan  All goals met       Recommendations  Diet recommendations: Dysphagia 3 (mechanical soft);Thin liquid Liquids provided via: Cup;Straw Medication Administration: Whole meds with puree Supervision: Full supervision/cueing for compensatory strategies Compensations: Slow rate;Small sips/bites Postural Changes and/or Swallow Maneuvers: Seated upright 90 degrees                Oral Care Recommendations: Oral care BID Follow up Recommendations: Skilled Nursing facility SLP Visit Diagnosis: Dysphagia, unspecified (R13.10) Plan: All goals met       GO                Betty [name] Betty [name] 06/10/2019, 3:51 PM  Pollyann Glen, M.A. [facility] Acute Environmental education officer [phone] Office [phone]

## 2019-06-10 NOTE — Progress Notes (Signed)
Pt   Physical Therapy Treatment Patient Details Name: Betty [name] MRN: [id number] DOB: [birth year] Today's Date: 06/10/2019    History of Present Illness Pt is a 73 y.o. female admitted 05/13/19 with AMS and weakness; diagnosed with COVID-19 oin 7/9. ETT 7/10-7/13, reintubated 7/13- 7/21; transfer out of ICU 7/22. Returned to ICU 7/23 with worsening encephalopathy. Plan to transfer to floor 7/24. PMH includes CAD, right ruptured cerebral aneurysm,non-small cell lung CA, HTN, COPD, CKD IV, CHF, dementia., PPM, R L L lobectomy., NSTEMI, afib    PT Comments    Patient pleasantly confused. Initially felt pt would get up, required much encouragement to mobilioze to recliner. Pt. Would not stand at the Rw, stating that she could not. Continue PT for mobility.   Follow Up Recommendations  SNF;Supervision/Assistance - 24 hour     Equipment Recommendations  None recommended by PT    Recommendations for Other Services       Precautions / Restrictions Precautions Precautions: Fall Precaution Comments: incontinent on bowel, watch HR after session Restrictions Weight Bearing Restrictions: No    Mobility  Bed Mobility Overal bed mobility: Needs Assistance Bed Mobility: Supine to Sit     Supine to sit: Max assist;+2 for physical assistance     General bed mobility comments: Max A for bringing BLEs towards EOB and then to elevate trunk.   Transfers Overall transfer level: Needs assistance Equipment used: 2 person hand held assist Transfers: Squat Pivot Transfers Sit to Stand: Mod assist;+2 physical assistance;+2 safety/equipment   Squat pivot transfers: Max assist;+2 physical assistance;+2 safety/equipment     General transfer comment: Max A +2 to power up and then pivot to recliner.  Ambulation/Gait                 Stairs             Wheelchair Mobility    Modified Rankin (Stroke Patients Only)       Balance Overall balance assessment:  Independent Sitting-balance support: Bilateral upper extremity supported Sitting balance-Leahy Scale: Fair Sitting balance - Comments: Pt sitting at EOB with Min Guard A     Standing balance-Leahy Scale: Poor Standing balance comment: Reliant on physical A                            Cognition Arousal/Alertness: Awake/alert Behavior During Therapy: WFL for tasks assessed/performed;Impulsive Overall Cognitive Status: Impaired/Different from baseline Area of Impairment: Orientation;Attention;Memory;Following commands;Safety/judgement;Awareness;Problem solving                 Orientation Level: Disoriented to;Place;Time;Situation Current Attention Level: Sustained Memory: Decreased short-term memory Following Commands: Follows one step commands inconsistently Safety/Judgement: Decreased awareness of safety;Decreased awareness of deficits Awareness: Intellectual Problem Solving: Slow processing;Decreased initiation;Difficulty sequencing;Requires verbal cues;Requires tactile cues General Comments: H/o dementia. Pt more talkative and talking about her family. Pt thinking therapist is her niece. Requiring increased time and cues, would not stand initially, started to lie down. required much encouragement to sit up and transfer to recliner.      Exercises      General Comments General comments (skin integrity, edema, etc.): SpO2 >90% on RA.       Pertinent Vitals/Pain Pain Assessment: Faces Faces Pain Scale: No hurt Pain Intervention(s): Monitored during session    Home Living                      Prior Function  PT Goals (current goals can now be found in the care plan section) Acute Rehab PT Goals Patient Stated Goal: none stated Progress towards PT goals: Progressing toward goals    Frequency    Min 2X/week      PT Plan Current plan remains appropriate    Co-evaluation PT/OT/SLP Co-Evaluation/Treatment: Yes Reason for  Co-Treatment: For patient/therapist safety PT goals addressed during session: Mobility/safety with mobility OT goals addressed during session: ADL's and self-care      AM-PAC PT "6 Clicks" Mobility   Outcome Measure  Help needed turning from your back to your side while in a flat bed without using bedrails?: A Lot Help needed moving from lying on your back to sitting on the side of a flat bed without using bedrails?: A Lot Help needed moving to and from a bed to a chair (including a wheelchair)?: A Lot Help needed standing up from a chair using your arms (e.g., wheelchair or bedside chair)?: A Lot Help needed to walk in hospital room?: Total Help needed climbing 3-5 steps with a railing? : Total 6 Click Score: 10    End of Session Equipment Utilized During Treatment: Gait belt Activity Tolerance: Patient tolerated treatment well Patient left: in chair;with call bell/phone within reach;with chair alarm set Nurse Communication: Mobility status PT Visit Diagnosis: Other abnormalities of gait and mobility (R26.89);Muscle weakness (generalized) (M62.81)     Time: 1025-8527 PT Time Calculation (min) (ACUTE ONLY): 24 min  Charges:  $Therapeutic Activity: 8-22 mins                     Tresa Endo PT Acute Rehabilitation Services   Office [phone]    [name] 06/10/2019, 4:47 PM

## 2019-06-10 NOTE — Progress Notes (Signed)
Occupational Therapy Treatment Patient Details Name: Betty [name] MRN: [id number] DOB: Jan 31, 1946 Today's Date: 06/10/2019    History of present illness Pt is a 73 y.o. female admitted 05/13/19 with AMS and weakness; diagnosed with COVID-19 oin 7/9. ETT 7/10-7/13, reintubated 7/13- 7/21; transfer out of ICU 7/22. Returned to ICU 7/23 with worsening encephalopathy. Plan to transfer to floor 7/24. PMH includes CAD, right ruptured cerebral aneurysm,non-small cell lung CA, HTN, COPD, CKD IV, CHF, dementia., PPM, R L L lobectomy., NSTEMI, afib   OT comments  Pt continues to present with decreased cognition and required increased encouragement for OOB activity. Pt donning her socks while long sitting in bed and making pleasant conversation about her family. Pt requiring Max A +2 for squat pivot to recliner. Continue to recommend dc to SNF and will continue to follow acutely as admitted.    Follow Up Recommendations  SNF;Supervision/Assistance - 24 hour    Equipment Recommendations  Other (comment)(TBD in next venue)    Recommendations for Other Services      Precautions / Restrictions Precautions Precautions: Fall Precaution Comments:  incontinent on bowel Restrictions Weight Bearing Restrictions: No       Mobility Bed Mobility Overal bed mobility: Needs Assistance Bed Mobility: Supine to Sit     Supine to sit: Max assist;+2 for physical assistance     General bed mobility comments: Max A for bringing BLEs towards EOB and then to elevate trunk.   Transfers Overall transfer level: Needs assistance Equipment used: 2 person hand held assist Transfers: Squat Pivot Transfers     Squat pivot transfers: Max assist;+2 physical assistance;+2 safety/equipment     General transfer comment: Max A +2 to power up and then pivot to recliner.    Balance Overall balance assessment: Independent Sitting-balance support: Bilateral upper extremity supported Sitting balance-Leahy Scale:  Fair Sitting balance - Comments: Pt sitting at EOB with Min Guard A     Standing balance-Leahy Scale: Poor Standing balance comment: Reliant on physical A                           ADL either performed or assessed with clinical judgement   ADL Overall ADL's : Needs assistance/impaired                     Lower Body Dressing: Min guard;Bed level Lower Body Dressing Details (indicate cue type and reason): Pt donning her socks while long sitting in bed with Min Guard A Toilet Transfer: Maximal assistance;Stand-pivot;+2 for physical assistance;+2 for safety/equipment(simulated to recliner) Toilet Transfer Details (indicate cue type and reason): Max A +2 to assist in pivot to recliner. Pt requiring increased encouragement.          Functional mobility during ADLs: Maximal assistance;+2 for physical assistance;+2 for safety/equipment(stand pivot) General ADL Comments: Pt requiring increased encouragement this session for OOB activity     Vision       Perception     Praxis      Cognition Arousal/Alertness: Awake/alert Behavior During Therapy: WFL for tasks assessed/performed;Impulsive Overall Cognitive Status: Impaired/Different from baseline Area of Impairment: Orientation;Attention;Memory;Following commands;Safety/judgement;Awareness;Problem solving                 Orientation Level: Disoriented to;Place;Time;Situation Current Attention Level: Sustained Memory: Decreased short-term memory Following Commands: Follows one step commands inconsistently Safety/Judgement: Decreased awareness of safety;Decreased awareness of deficits Awareness: Intellectual Problem Solving: Slow processing;Decreased initiation;Difficulty sequencing;Requires verbal cues;Requires tactile cues General Comments: H/o dementia.  Pt more talkative and talking about her family. Pt thinking therapist is her niece. Requiring increased time and cues        Exercises     Shoulder  Instructions       General Comments SpO2 >90% on RA.     Pertinent Vitals/ Pain       Pain Assessment: Faces Faces Pain Scale: No hurt Pain Intervention(s): Monitored during session  Home Living                                          Prior Functioning/Environment              Frequency  Min 2X/week        Progress Toward Goals  OT Goals(current goals can now be found in the care plan section)  Progress towards OT goals: Progressing toward goals  Acute Rehab OT Goals Patient Stated Goal: none stated OT Goal Formulation: Patient unable to participate in goal setting Time For Goal Achievement: 06/12/19 Potential to Achieve Goals: Fair ADL Goals Pt Will Perform Grooming: with min guard assist;sitting Pt Will Perform Upper Body Bathing: with min assist;sitting Pt Will Perform Upper Body Dressing: with min assist;sitting Pt Will Transfer to Toilet: with mod assist;stand pivot transfer;bedside commode Pt Will Perform Toileting - Clothing Manipulation and hygiene: with mod assist;sit to/from stand Pt/caregiver will Perform Home Exercise Program: Increased ROM;Increased strength;Both right and left upper extremity;With minimal assist;With written HEP provided Additional ADL Goal #1: Pt will follow 1 step commands with 75% accuracy during functional tasks. Additional ADL Goal #2: Pt will sustain attention to functional task >5 min without cues.  Plan Discharge plan remains appropriate    Co-evaluation    PT/OT/SLP Co-Evaluation/Treatment: Yes Reason for Co-Treatment: For patient/therapist safety;To address functional/ADL transfers   OT goals addressed during session: ADL's and self-care      AM-PAC OT "6 Clicks" Daily Activity     Outcome Measure   Help from another person eating meals?: A Lot Help from another person taking care of personal grooming?: A Little Help from another person toileting, which includes using toliet, bedpan, or urinal?:  A Lot Help from another person bathing (including washing, rinsing, drying)?: A Lot Help from another person to put on and taking off regular upper body clothing?: A Lot Help from another person to put on and taking off regular lower body clothing?: A Lot 6 Click Score: 13    End of Session    OT Visit Diagnosis: Muscle weakness (generalized) (M62.81);Other symptoms and signs involving cognitive function;Unsteadiness on feet (R26.81)   Activity Tolerance Patient tolerated treatment well;Patient limited by fatigue   Patient Left with call bell/phone within reach;in chair;with chair alarm set   Nurse Communication Mobility status([place])        Time: 9381-8299 OT Time Calculation (min): 23 min  Charges: OT General Charges $OT Visit: 1 Visit OT Treatments $Self Care/Home Management : 8-22 mins  [place], OTR/L Acute Rehab Pager: [phone] Office: [place] 06/10/2019, 4:28 PM

## 2019-06-10 NOTE — Progress Notes (Signed)
PROGRESS NOTE  Betty [name]  ZJI:[id number] DOB: 27-Aug-1946 DOA: 05/13/2019 PCP: Josetta Huddle, MD   Brief Narrative: Betty [name] is a 73 y.o. female with a history of dementia, CAD, chronic combined HFrEF, NSCLC 2016, COPD, stage IV CKD, HTN, and HLD who presented to the ED 7/8 due to increasing confusion. She was found to have covid-19 infection and hypoxic requiring 2L O2. She was admitted to [facility] with steroids and remdesivir, decompensated and required intubation 7/10. Subsequently extubated 7/13 but reintubated due to encephalopathy. Ultimately was extubated 7/21 and has experienced clinical improvement.   Assessment & Plan: Principal Problem:   COVID-19 Active Problems:   Non-small cell carcinoma of right lung, stage 1 (HCC)   CKD (chronic kidney disease), stage IV (HCC)   Chronic systolic heart failure (HCC)   Dementia (HCC)   Essential hypertension   Acute respiratory failure with hypoxemia (HCC)   Pressure injury of skin   Altered mental status  Acute hypoxic respiratory failure due to covid-19 pneumonia: Received remdesivir, tocilizumab, steroids. Extubated 7/21 and now back to room air.  - Continue airborne, contact precautions.  - No further Tx indicated.   Acute metabolic encephalopathy due to sepsis due to UTI, septic shock ruled out:  - Urine culture w/E. coli, completed courses of zosyn and ceftriaxone.  Dementia: - Delirium precautions - Continue remeron and zyprexa  Chronic combined HFrEF: Appears euvolemic. EF 30-35%. BNP negative. - Coreg added.  New onset PAF with RVR: Rate control improving.  - Continue coreg - Therapeutic lovenox. Given [place] discussions and history of GI bleeding, plan is to discharge on ASA and defer to PCP.   AKI on stage IV CKD: Back near baseline.  - Avoid nephrotoxins.  T2DM: HbA1c 6.9% diagnostic for diabetes. Has been on prednisone for acute gout in the past.  - SSI not currently needed as CBGs are at inpatient goal.  Lower GI  bleed: Resolved. FOBT 7/26 negative.   Acute blood loss anemia: s/p 1u PRBCs. Stable.   Stage II pressure injury of the coccyx not POA:  - Offload as able - Optimize nutritional status  NSCLC, COPD: s/p RLL lobectomy and adjuvant chemotherapy  Dysphagia:  - Continue SLP evaluations and treatments.  Gout: Chronic, no flare.  - Monitor  Hypernatremia: Resolved.   DVT prophylaxis: Lovenox Code Status: DNR Family Communication: None at bedside Disposition Plan: SNF recommended, pending repeat testing to inform choices.  Consultants:   PCCM  Procedures:   Intubation  Antimicrobials:  Vancomycin  Zosyn  Ceftriaxone   Subjective: No complaints. No shortness of breath or chest pain. No fevers, no dysuria or abd pain.  Objective: Vitals:   06/10/19 0415 06/10/19 0813 06/10/19 1200 06/10/19 1616  BP: 138/81 132/80 118/75 114/70  Pulse: (!) 113 (!) 108 100 (!) 117  Resp: (!) 21  (!) 24   Temp: 98.2 F (36.8 C) 98.1 F (36.7 C)  98.3 F (36.8 C)  TempSrc: Oral Oral  Oral  SpO2: 100% 96% 99% 96%  Weight:      Height:        Intake/Output Summary (Last 24 hours) at 06/10/2019 1627 Last data filed at 06/10/2019 1446 Gross per 24 hour  Intake 834 ml  Output 300 ml  Net 534 ml   Filed Weights   06/02/19 0330 06/03/19 0500 06/04/19 0343  Weight: 57.1 kg 54.5 kg 46.3 kg    Gen: 73 y.o. female in no distress Pulm: Non-labored breathing on room air. Clear to auscultation  bilaterally.  CV: Regular rate and rhythm. No murmur, rub, or gallop. No JVD, no pedal edema. GI: Abdomen soft, non-tender, non-distended, with normoactive bowel sounds. No organomegaly or masses felt. Ext: Warm, no deformities Skin: No rashes, lesions or ulcers Neuro: Alert and pleasantly disoriented. No new focal neurological deficits. Psych: Judgement and insight appear impaired. Mood & affect appropriate.   Data Reviewed: I have personally reviewed following labs and imaging studies  CBC:  Recent Labs  Lab 06/06/19 0145 06/07/19 0325 06/08/19 0320 06/09/19 0355 06/10/19 0300  WBC 9.5 11.1* 12.8* 11.9* 12.1*  NEUTROABS 6.6 8.2* 8.7* 7.5 7.5  HGB 12.3 11.8* 12.3 11.6* 12.3  HCT 38.4 37.0 37.7 36.1 37.8  MCV 97.2 97.1 97.2 96.8 96.7  PLT 239 278 312 331 628   Basic Metabolic Panel: Recent Labs  Lab 06/04/19 0805 06/05/19 0156 06/06/19 0145 06/07/19 0325 06/08/19 0320 06/09/19 0355 06/10/19 0300  NA 142 142 141 142 149* 146* 142  K 5.1 4.9 5.0 4.6 4.4 4.1 3.8  CL 106 107 107 109 114* 112* 108  CO2 23 24 21* 23 22 21* 21*  GLUCOSE 89 150* 118* 93 120* 112* 124*  BUN 67* 67* 57* 47* 66* 66* 58*  CREATININE 2.25* 2.42* 2.09* 2.16* 2.67* 2.58* 2.63*  CALCIUM 9.4 9.6 9.1 9.4 9.6 9.4 9.3  MG 2.2 2.1 2.0 2.0 2.1 2.1 2.1  PHOS 5.3* 5.2*  --   --   --   --   --    GFR: Estimated Creatinine Clearance: 13.7 mL/min (A) (by C-G formula based on SCr of 2.63 mg/dL (H)). Liver Function Tests: Recent Labs  Lab 06/06/19 0145 06/07/19 0325 06/08/19 0320 06/09/19 0355 06/10/19 0300  AST 25 22 20 21 22   ALT 21 19 20 21 21   ALKPHOS 67 88 82 73 73  BILITOT 0.5 0.6 0.5 0.5 0.6  PROT 6.2* 6.5 6.7 6.5 6.6  ALBUMIN 2.9* 3.0* 2.9* 2.8* 2.9*   No results for input(s): LIPASE, AMYLASE in the last 168 hours. No results for input(s): AMMONIA in the last 168 hours. Coagulation Profile: No results for input(s): INR, PROTIME in the last 168 hours. Cardiac Enzymes: No results for input(s): CKTOTAL, CKMB, CKMBINDEX, TROPONINI in the last 168 hours. BNP (last 3 results) No results for input(s): PROBNP in the last 8760 hours. HbA1C: No results for input(s): HGBA1C in the last 72 hours. CBG: Recent Labs  Lab 06/07/19 1232 06/07/19 1618 06/08/19 0744 06/08/19 1211 06/10/19 0817  GLUCAP 142* 179* 124* 181* 117*   Lipid Profile: No results for input(s): CHOL, HDL, LDLCALC, TRIG, CHOLHDL, LDLDIRECT in the last 72 hours. Thyroid Function Tests: No results for input(s): TSH,  T4TOTAL, FREET4, T3FREE, THYROIDAB in the last 72 hours. Anemia Panel: No results for input(s): VITAMINB12, FOLATE, FERRITIN, TIBC, IRON, RETICCTPCT in the last 72 hours. Urine analysis:    Component Value Date/Time   COLORURINE YELLOW 05/21/2019 1541   APPEARANCEUR CLEAR 05/21/2019 1541   LABSPEC 1.012 05/21/2019 1541   PHURINE 5.0 05/21/2019 1541   GLUCOSEU NEGATIVE 05/21/2019 1541   HGBUR MODERATE (A) 05/21/2019 1541   BILIRUBINUR NEGATIVE 05/21/2019 1541   KETONESUR NEGATIVE 05/21/2019 1541   PROTEINUR NEGATIVE 05/21/2019 1541   UROBILINOGEN 0.2 04/27/2015 0834   NITRITE NEGATIVE 05/21/2019 1541   LEUKOCYTESUR NEGATIVE 05/21/2019 1541   No results found for this or any previous visit (from the past 240 hour(s)).    Radiology Studies: No results found.  Scheduled Meds: . carvedilol  18.75 mg Oral  BID WC  . chlorhexidine  15 mL Mouth Rinse BID  . Chlorhexidine Gluconate Cloth  6 each Topical Daily  . enoxaparin (LOVENOX) injection  50 mg Subcutaneous Q24H  . feeding supplement (ENSURE ENLIVE)  237 mL Oral TID BM  . feeding supplement (PRO-STAT SUGAR FREE 64)  30 mL Oral Daily  . Ipratropium-Albuterol  1 puff Inhalation Q6H  . mouth rinse  15 mL Mouth Rinse q12n4p  . mirtazapine  7.5 mg Oral QHS  . multivitamin with minerals  1 tablet Oral Daily  . OLANZapine  2.5 mg Oral Daily  . pantoprazole  40 mg Oral Daily  . sodium chloride flush  10-40 mL Intracatheter Q12H  . sodium chloride flush  3 mL Intravenous Q12H   Continuous Infusions: . sodium chloride 250 mL (06/08/19 2243)     LOS: 27 days   Time spent: 25 minutes.  Patrecia Pour, MD [facility] [email or web address] Password [facility] 06/10/2019, 4:27 PM-pn

## 2019-06-10 NOTE — Progress Notes (Signed)
Pt is pleasantly confused, adamant about calling the fire department & her sister. Attempts to re orient pt without success, trying to put the blanket on as if its a shirt. VSS, phone call placed to son Chrissie Noa

## 2019-06-11 LAB — NOVEL CORONAVIRUS, NAA (HOSP ORDER, SEND-OUT TO REF LAB; TAT 18-24 HRS): SARS-CoV-2, NAA: NOT DETECTED

## 2019-06-11 LAB — GLUCOSE, CAPILLARY
Glucose-Capillary: 121 mg/dL — ABNORMAL HIGH (ref 70–99)
Glucose-Capillary: 118 mg/dL — ABNORMAL HIGH (ref 70–99)
Glucose-Capillary: 103 mg/dL — ABNORMAL HIGH (ref 70–99)
Glucose-Capillary: 131 mg/dL — ABNORMAL HIGH (ref 70–99)

## 2019-06-11 MED ORDER — METOPROLOL TARTRATE 50 MG PO TABS
100.0000 mg | ORAL_TABLET | Freq: Once | ORAL | Status: AC
  Administered 2019-06-11: 09:00:00 100 mg via ORAL
  Filled 2019-06-11: qty 2

## 2019-06-11 MED ORDER — METOPROLOL SUCCINATE ER 25 MG PO TB24
100.0000 mg | ORAL_TABLET | Freq: Every day | ORAL | Status: DC
  Administered 2019-06-11 – 2019-06-12 (×2): 100 mg via ORAL
  Filled 2019-06-11 (×2): qty 4

## 2019-06-11 NOTE — Discharge Instructions (Signed)
COVID-19 Frequently Asked Questions °COVID-19 (coronavirus disease) is an infection that is caused by a large family of viruses. Some viruses cause illness in people and others cause illness in animals like camels, cats, and bats. In some cases, the viruses that cause illness in animals can spread to humans. °Where did the coronavirus come from? °In December 2019, [place] told the World Health Organization (WHO) of several cases of lung disease (human respiratory illness). These cases were linked to an open seafood and livestock market in the city of [place]. The link to the seafood and livestock market suggests that the virus may have spread from animals to humans. However, since that first outbreak in December, the virus has also been shown to spread from person to person. °What is the name of the disease and the virus? °Disease name °Early on, this disease was called novel coronavirus. This is because scientists determined that the disease was caused by a new (novel) respiratory virus. The World Health Organization (WHO) has now named the disease COVID-19, or coronavirus disease. °Virus name °The virus that causes the disease is called severe acute respiratory syndrome coronavirus 2 (SARS-CoV-2). °More information on disease and virus naming °World Health Organization (WHO): www.who.int/emergencies/diseases/novel-coronavirus-2019/technical-guidance/naming-the-coronavirus-disease-(covid-2019)-and-the-virus-that-causes-it °Who is at risk for complications from coronavirus disease? °Some people may be at higher risk for complications from coronavirus disease. This includes older adults and people who have chronic diseases, such as heart disease, diabetes, and lung disease. °If you are at higher risk for complications, take these extra precautions: °· Avoid close contact with people who are sick or have a fever or cough. Stay at least 3-6 ft (1-2 m) away from them, if possible. °· Wash your hands often with soap and  water for at least 20 seconds. °· Avoid touching your face, mouth, nose, or eyes. °· Keep supplies on hand at home, such as food, medicine, and cleaning supplies. °· Stay home as much as possible. °· Avoid social gatherings and travel. °How does coronavirus disease spread? °The virus that causes coronavirus disease spreads easily from person to person (is contagious). There are also cases of community-spread disease. This means the disease has spread to: °· People who have no known contact with other infected people. °· People who have not traveled to areas where there are known cases. °It appears to spread from one person to another through droplets from coughing or sneezing. °Can I get the virus from touching surfaces or objects? °There is still a lot that we do not know about the virus that causes coronavirus disease. Scientists are basing a lot of information on what they know about similar viruses, such as: °· Viruses cannot generally survive on surfaces for long. They need a human body (host) to survive. °· It is more likely that the virus is spread by close contact with people who are sick (direct contact), such as through: °? Shaking hands or hugging. °? Breathing in respiratory droplets that travel through the air. This can happen when an infected person coughs or sneezes on or near other people. °· It is less likely that the virus is spread when a person touches a surface or object that has the virus on it (indirect contact). The virus may be able to enter the body if the person touches a surface or object and then touches his or her face, eyes, nose, or mouth. °Can a person spread the virus without having symptoms of the disease? °It may be possible for the virus to spread before a person   has symptoms of the disease, but this is most likely not the main way the virus is spreading. It is more likely for the virus to spread by being in close contact with people who are sick and breathing in the respiratory  droplets of a sick person's cough or sneeze. °What are the symptoms of coronavirus disease? °Symptoms vary from person to person and can range from mild to severe. Symptoms may include: °· Fever. °· Cough. °· Tiredness, weakness, or fatigue. °· Fast breathing or feeling short of breath. °These symptoms can appear anywhere from 2 to 14 days after you have been exposed to the virus. If you develop symptoms, call your health care provider. People with severe symptoms may need hospital care. °If I am exposed to the virus, how long does it take before symptoms start? °Symptoms of coronavirus disease may appear anywhere from 2 to 14 days after a person has been exposed to the virus. If you develop symptoms, call your health care provider. °Should I be tested for this virus? °Your health care provider will decide whether to test you based on your symptoms, history of exposure, and your risk factors. °How does a health care provider test for this virus? °Health care providers will collect samples to send for testing. Samples may include: °· Taking a swab of fluid from the nose. °· Taking fluid from the lungs by having you cough up mucus (sputum) into a sterile cup. °· Taking a blood sample. °· Taking a stool or urine sample. °Is there a treatment or vaccine for this virus? °Currently, there is no vaccine to prevent coronavirus disease. Also, there are no medicines like antibiotics or antivirals to treat the virus. A person who becomes sick is given supportive care, which means rest and fluids. A person may also relieve his or her symptoms by using over-the-counter medicines that treat sneezing, coughing, and runny nose. These are the same medicines that a person takes for the common cold. °If you develop symptoms, call your health care provider. People with severe symptoms may need hospital care. °What can I do to protect myself and my family from this virus? ° °  ° °You can protect yourself and your family by taking the  same actions that you would take to prevent the spread of other viruses. Take the following actions: °· Wash your hands often with soap and water for at least 20 seconds. If soap and water are not available, use alcohol-based hand sanitizer. °· Avoid touching your face, mouth, nose, or eyes. °· Cough or sneeze into a tissue, sleeve, or elbow. Do not cough or sneeze into your hand or the air. °? If you cough or sneeze into a tissue, throw it away immediately and wash your hands. °· Disinfect objects and surfaces that you frequently touch every day. °· Avoid close contact with people who are sick or have a fever or cough. Stay at least 3-6 ft (1-2 m) away from them, if possible. °· Stay home if you are sick, except to get medical care. Call your health care provider before you get medical care. °· Make sure your vaccines are up to date. Ask your health care provider what vaccines you need. °What should I do if I need to travel? °Follow travel recommendations from your local health authority, the CDC, and WHO. °Travel information and advice °· Centers for Disease Control and Prevention (CDC): www.cdc.gov/coronavirus/2019-ncov/travelers/index.html °· World Health Organization (WHO): www.who.int/emergencies/diseases/novel-coronavirus-2019/travel-advice °Know the risks and take action to protect your health °·   You are at higher risk of getting coronavirus disease if you are traveling to areas with an outbreak or if you are exposed to travelers from areas with an outbreak. °· Wash your hands often and practice good hygiene to lower the risk of catching or spreading the virus. °What should I do if I am sick? °General instructions to stop the spread of infection °· Wash your hands often with soap and water for at least 20 seconds. If soap and water are not available, use alcohol-based hand sanitizer. °· Cough or sneeze into a tissue, sleeve, or elbow. Do not cough or sneeze into your hand or the air. °· If you cough or  sneeze into a tissue, throw it away immediately and wash your hands. °· Stay home unless you must get medical care. Call your health care provider or local health authority before you get medical care. °· Avoid public areas. Do not take public transportation, if possible. °· If you can, wear a mask if you must go out of the house or if you are in close contact with someone who is not sick. °Keep your home clean °· Disinfect objects and surfaces that are frequently touched every day. This may include: °? Counters and tables. °? Doorknobs and light switches. °? Sinks and faucets. °? Electronics such as phones, remote controls, keyboards, computers, and tablets. °· Wash dishes in hot, soapy water or use a dishwasher. Air-dry your dishes. °· Wash laundry in hot water. °Prevent infecting other household members °· Let healthy household members care for children and pets, if possible. If you have to care for children or pets, wash your hands often and wear a mask. °· Sleep in a different bedroom or bed, if possible. °· Do not share personal items, such as razors, toothbrushes, deodorant, combs, brushes, towels, and washcloths. °Where to find more information °Centers for Disease Control and Prevention (CDC) °· Information and news updates: www.cdc.gov/coronavirus/2019-ncov °World Health Organization (WHO) °· Information and news updates: www.who.int/emergencies/diseases/novel-coronavirus-2019 °· Coronavirus health topic: www.who.int/health-topics/coronavirus °· Questions and answers on COVID-19: www.who.int/news-room/q-a-detail/q-a-coronaviruses °· Global tracker: who.sprinklr.com °American Academy of Pediatrics (AAP) °· Information for families: www.healthychildren.org/English/health-issues/conditions/chest-lungs/Pages/2019-Novel-Coronavirus.aspx °The coronavirus situation is changing rapidly. Check your local health authority website or the CDC and WHO websites for updates and news. °When should I contact a health care  provider? °· Contact your health care provider if you have symptoms of an infection, such as fever or cough, and you: °? Have been near anyone who is known to have coronavirus disease. °? Have come into contact with a person who is suspected to have coronavirus disease. °? Have traveled outside of the country. °When should I get emergency medical care? °· Get help right away by calling your local emergency services (911 in the [place] if you have: °? Trouble breathing. °? Pain or pressure in your chest. °? Confusion. °? Blue-tinged lips and fingernails. °? Difficulty waking from sleep. °? Symptoms that get worse. °Let the emergency medical personnel know if you think you have coronavirus disease. °Summary °· A new respiratory virus is spreading from person to person and causing COVID-19 (coronavirus disease). °· The virus that causes COVID-19 appears to spread easily. It spreads from one person to another through droplets from coughing or sneezing. °· Older adults and those with chronic diseases are at higher risk of disease. If you are at higher risk for complications, take extra precautions. °· There is currently no vaccine to prevent coronavirus disease. There are no medicines, such as antibiotics or   antivirals, to treat the virus. °· You can protect yourself and your family by washing your hands often, avoiding touching your face, and covering your coughs and sneezes. °This information is not intended to replace advice given to you by your health care provider. Make sure you discuss any questions you have with your health care provider. °Document Released: 02/17/2019 Document Revised: 02/17/2019 Document Reviewed: 02/17/2019 °Elsevier Patient Education © 2020 [employer] ° ° °COVID-19 °COVID-19 is a respiratory infection that is caused by a virus called severe acute respiratory syndrome coronavirus 2 (SARS-CoV-2). The disease is also known as coronavirus disease or novel coronavirus. In some people, the virus  may not cause any symptoms. In others, it may cause a serious infection. The infection can get worse quickly and can lead to complications, such as: °· Pneumonia, or infection of the lungs. °· Acute respiratory distress syndrome or ARDS. This is fluid build-up in the lungs. °· Acute respiratory failure. This is a condition in which there is not enough oxygen passing from the lungs to the body. °· Sepsis or septic shock. This is a serious bodily reaction to an infection. °· Blood clotting problems. °· Secondary infections due to bacteria or fungus. °The virus that causes COVID-19 is contagious. This means that it can spread from person to person through droplets from coughs and sneezes (respiratory secretions). °What are the causes? °This illness is caused by a virus. You may catch the virus by: °· Breathing in droplets from an infected person's cough or sneeze. °· Touching something, like a table or a doorknob, that was exposed to the virus (contaminated) and then touching your mouth, nose, or eyes. °What increases the risk? °Risk for infection °You are more likely to be infected with this virus if you: °· Live in or travel to an area with a COVID-19 outbreak. °· Come in contact with a sick person who recently traveled to an area with a COVID-19 outbreak. °· Provide care for or live with a person who is infected with COVID-19. °Risk for serious illness °You are more likely to become seriously ill from the virus if you: °· Are 65 years of age or older. °· Have a long-term disease that lowers your body's ability to fight infection (immunocompromised). °· Live in a nursing home or long-term care facility. °· Have a long-term (chronic) disease such as: °? Chronic lung disease, including chronic obstructive pulmonary disease or asthma °? Heart disease. °? Diabetes. °? Chronic kidney disease. °? Liver disease. °· Are obese. °What are the signs or symptoms? °Symptoms of this condition can range from mild to severe.  Symptoms may appear any time from 2 to 14 days after being exposed to the virus. They include: °· A fever. °· A cough. °· Difficulty breathing. °· Chills. °· Muscle pains. °· A sore throat. °· Loss of taste or smell. °Some people may also have stomach problems, such as nausea, vomiting, or diarrhea. °Other people may not have any symptoms of COVID-19. °How is this diagnosed? °This condition may be diagnosed based on: °· Your signs and symptoms, especially if: °? You live in an area with a COVID-19 outbreak. °? You recently traveled to or from an area where the virus is common. °? You provide care for or live with a person who was diagnosed with COVID-19. °· A physical exam. °· Lab tests, which may include: °? A nasal swab to take a sample of fluid from your nose. °? A throat swab to take a sample of   fluid from your throat. °? A sample of mucus from your lungs (sputum). °? Blood tests. °· Imaging tests, which may include, X-rays, CT scan, or ultrasound. °How is this treated? °At present, there is no medicine to treat COVID-19. Medicines that treat other diseases are being used on a trial basis to see if they are effective against COVID-19. °Your health care provider will talk with you about ways to treat your symptoms. For most people, the infection is mild and can be managed at home with rest, fluids, and over-the-counter medicines. °Treatment for a serious infection usually takes places in a hospital intensive care unit (ICU). It may include one or more of the following treatments. These treatments are given until your symptoms improve. °· Receiving fluids and medicines through an IV. °· Supplemental oxygen. Extra oxygen is given through a tube in the nose, a face mask, or a hood. °· Positioning you to lie on your stomach (prone position). This makes it easier for oxygen to get into the lungs. °· Continuous positive airway pressure (CPAP) or bi-level positive airway pressure (BPAP) machine. This treatment uses mild  air pressure to keep the airways open. A tube that is connected to a motor delivers oxygen to the body. °· Ventilator. This treatment moves air into and out of the lungs by using a tube that is placed in your windpipe. °· Tracheostomy. This is a procedure to create a hole in the neck so that a breathing tube can be inserted. °· Extracorporeal membrane oxygenation (ECMO). This procedure gives the lungs a chance to recover by taking over the functions of the heart and lungs. It supplies oxygen to the body and removes carbon dioxide. °Follow these instructions at home: °Lifestyle °· If you are sick, stay home except to get medical care. Your health care provider will tell you how long to stay home. Call your health care provider before you go for medical care. °· Rest at home as told by your health care provider. °· Do not use any products that contain nicotine or tobacco, such as cigarettes, e-cigarettes, and chewing tobacco. If you need help quitting, ask your health care provider. °· Return to your normal activities as told by your health care provider. Ask your health care provider what activities are safe for you. °General instructions °· Take over-the-counter and prescription medicines only as told by your health care provider. °· Drink enough fluid to keep your urine pale yellow. °· Keep all follow-up visits as told by your health care provider. This is important. °How is this prevented? ° °There is no vaccine to help prevent COVID-19 infection. However, there are steps you can take to protect yourself and others from this virus. °To protect yourself:  °· Do not travel to areas where COVID-19 is a risk. The areas where COVID-19 is reported change often. To identify high-risk areas and travel restrictions, check the CDC travel website: wwwnc.cdc.gov/travel/notices °· If you live in, or must travel to, an area where COVID-19 is a risk, take precautions to avoid infection. °? Stay away from people who are  sick. °? Wash your hands often with soap and water for 20 seconds. If soap and water are not available, use an alcohol-based hand sanitizer. °? Avoid touching your mouth, face, eyes, or nose. °? Avoid going out in public, follow guidance from your state and local health authorities. °? If you must go out in public, wear a cloth face covering or face mask. °? Disinfect objects and surfaces that are   frequently touched every day. This may include: °§ Counters and tables. °§ Doorknobs and light switches. °§ Sinks and faucets. °§ Electronics, such as phones, remote controls, keyboards, computers, and tablets. °To protect others: °If you have symptoms of COVID-19, take steps to prevent the virus from spreading to others. °· If you think you have a COVID-19 infection, contact your health care provider right away. Tell your health care team that you think you may have a COVID-19 infection. °· Stay home. Leave your house only to seek medical care. Do not use public transport. °· Do not travel while you are sick. °· Wash your hands often with soap and water for 20 seconds. If soap and water are not available, use alcohol-based hand sanitizer. °· Stay away from other members of your household. Let healthy household members care for children and pets, if possible. If you have to care for children or pets, wash your hands often and wear a mask. If possible, stay in your own room, separate from others. Use a different bathroom. °· Make sure that all people in your household wash their hands well and often. °· Cough or sneeze into a tissue or your sleeve or elbow. Do not cough or sneeze into your hand or into the air. °· Wear a cloth face covering or face mask. °Where to find more information °· Centers for Disease Control and Prevention: www.cdc.gov/coronavirus/2019-ncov/index.html °· World Health Organization: www.who.int/health-topics/coronavirus °Contact a health care provider if: °· You live in or have traveled to an area  where COVID-19 is a risk and you have symptoms of the infection. °· You have had contact with someone who has COVID-19 and you have symptoms of the infection. °Get help right away if: °· You have trouble breathing. °· You have pain or pressure in your chest. °· You have confusion. °· You have bluish lips and fingernails. °· You have difficulty waking from sleep. °· You have symptoms that get worse. °These symptoms may represent a serious problem that is an emergency. Do not wait to see if the symptoms will go away. Get medical help right away. Call your local emergency services (911 in the [place] Do not drive yourself to the hospital. Let the emergency medical personnel know if you think you have COVID-19. °Summary °· COVID-19 is a respiratory infection that is caused by a virus. It is also known as coronavirus disease or novel coronavirus. It can cause serious infections, such as pneumonia, acute respiratory distress syndrome, acute respiratory failure, or sepsis. °· The virus that causes COVID-19 is contagious. This means that it can spread from person to person through droplets from coughs and sneezes. °· You are more likely to develop a serious illness if you are 65 years of age or older, have a weak immunity, live in a nursing home, or have chronic disease. °· There is no medicine to treat COVID-19. Your health care provider will talk with you about ways to treat your symptoms. °· Take steps to protect yourself and others from infection. Wash your hands often and disinfect objects and surfaces that are frequently touched every day. Stay away from people who are sick and wear a mask if you are sick. °This information is not intended to replace advice given to you by your health care provider. Make sure you discuss any questions you have with your health care provider. °Document Released: 11/27/2018 Document Revised: 03/19/2019 Document Reviewed: 11/27/2018 °Elsevier Patient Education © 2020 [employer] Inc. ° °

## 2019-06-11 NOTE — Consult Note (Signed)
   [facility] CM Inpatient Consult   06/11/2019  KALINDA ROMANIELLO [birth year] [id number]   Follow up:  Disposition and LLOS with extreme high risk score for unplanned readmission of 35%  Patient is being recommended for a skilled facility. Following for disposition needs for [employer] member.  Will follow for post hospital transition to facility if it is a [facility] contracted skilled facility. Reviewed inpatient TOC CM team notes for disposition and barriers.  For questions,  Please contact:  Natividad Brood, RN BSN [facility] Liaison  [phone] business mobile phone Toll free office [phone]  Fax number: [phone] [email or web address]

## 2019-06-11 NOTE — Progress Notes (Signed)
PROGRESS NOTE  Betty [name]  ZOX:[id number] DOB: 10/11/1946 DOA: 05/13/2019 PCP: Josetta Huddle, MD   Brief Narrative: Betty [name] is a 73 y.o. female with a history of dementia, CAD, chronic combined HFrEF, NSCLC 2016, COPD, stage IV CKD, HTN, and HLD who presented to the ED 7/8 due to increasing confusion. She was found to have covid-19 infection and hypoxic requiring 2L O2. She was admitted to [facility] with steroids and remdesivir, decompensated and required intubation 7/10. Subsequently extubated 7/13 but reintubated due to encephalopathy. Ultimately was extubated 7/21 and has experienced clinical improvement.   Assessment & Plan: Principal Problem:   COVID-19 Active Problems:   Non-small cell carcinoma of right lung, stage 1 (HCC)   CKD (chronic kidney disease), stage IV (HCC)   Chronic systolic heart failure (HCC)   Dementia (HCC)   Essential hypertension   Acute respiratory failure with hypoxemia (HCC)   Pressure injury of skin   Altered mental status  Acute hypoxic respiratory failure due to covid-19 pneumonia: Received remdesivir, tocilizumab, steroids. Extubated 7/21 and now back to room air.  - Continue airborne, contact precautions.  - No further Tx indicated.   Acute metabolic encephalopathy due to sepsis due to UTI, septic shock ruled out:  - Urine culture w/E. coli, completed courses of zosyn and ceftriaxone.  Dementia: - Delirium precautions - Continue remeron and zyprexa  Chronic combined HFrEF s/p CRT pacemaker noted on telemetry: Appears euvolemic. EF 30-35%. BNP negative. - Convert coreg back to home metoprolol  New onset PAF with RVR: Rate control improving.  - Continue metoprolol. Will give tartrate formulation this AM and restart home succinate qHS this evening. Continue telemetry monitoring.  - Therapeutic lovenox. Given [place] discussions and history of GI bleeding, plan is to discharge on ASA and defer to PCP.   AKI on stage IV CKD: Back near baseline.  - Avoid  nephrotoxins.  T2DM: HbA1c 6.9% diagnostic for diabetes. Has been on prednisone for acute gout in the past.  - SSI not currently needed as CBGs are at inpatient goal.  Lower GI bleed: Resolved. FOBT 7/26 negative.   Acute blood loss anemia: s/p 1u PRBCs. Stable.   Stage II pressure injury of the coccyx not POA:  - Offload as able - Optimize nutritional status  NSCLC, COPD: s/p RLL lobectomy and adjuvant chemotherapy  Dysphagia:  - Continue SLP evaluations and treatments.  Gout: Chronic, no flare.  - Monitor  Hypernatremia: Resolved.  DVT prophylaxis: Lovenox Code Status: DNR Family Communication: None at bedside Disposition Plan: SNF recommended, pending repeat testing to inform choices. She is medically stable for discharge.  Consultants:   PCCM  Procedures:   Intubation  Antimicrobials:  Vancomycin  Zosyn  Ceftriaxone   Subjective: Patient is pleasantly and calmly confused, concerned about getting her son to school on time. Has no pain or shortness of breath or other complaints.  Objective: Vitals:   06/10/19 1200 06/10/19 1616 06/10/19 2000 06/11/19 0831  BP: 118/75 114/70 117/72 123/82  Pulse: 100 (!) 117 (!) 107 (!) 124  Resp: (!) 24  (!) 27 17  Temp:  98.3 F (36.8 C) 97.8 F (36.6 C) 98.5 F (36.9 C)  TempSrc:  Oral Oral Oral  SpO2: 99% 96% 99% 99%  Weight:      Height:        Intake/Output Summary (Last 24 hours) at 06/11/2019 1028 Last data filed at 06/11/2019 0900 Gross per 24 hour  Intake 1367 ml  Output 950 ml  Net  417 ml   Filed Weights   06/02/19 0330 06/03/19 0500 06/04/19 0343  Weight: 57.1 kg 54.5 kg 46.3 kg   Gen: 73 y.o. female in no distress Pulm: Nonlabored breathing room air. Clear. CV: Regular rate and rhythm. No murmur, rub, or gallop. No JVD, no dependent edema. GI: Abdomen soft, non-tender, non-distended, with normoactive bowel sounds.  Ext: Warm, no deformities Skin: No rashes, lesions or ulcers on visualized skin.  Neuro: Alert and disoriented. No focal neurological deficits. Psych: Judgement and insight appear impaired. Mood euthymic & affect congruent.  Data Reviewed: I have personally reviewed following labs and imaging studies  CBC: Recent Labs  Lab 06/06/19 0145 06/07/19 0325 06/08/19 0320 06/09/19 0355 06/10/19 0300  WBC 9.5 11.1* 12.8* 11.9* 12.1*  NEUTROABS 6.6 8.2* 8.7* 7.5 7.5  HGB 12.3 11.8* 12.3 11.6* 12.3  HCT 38.4 37.0 37.7 36.1 37.8  MCV 97.2 97.1 97.2 96.8 96.7  PLT 239 278 312 331 829   Basic Metabolic Panel: Recent Labs  Lab 06/05/19 0156 06/06/19 0145 06/07/19 0325 06/08/19 0320 06/09/19 0355 06/10/19 0300  NA 142 141 142 149* 146* 142  K 4.9 5.0 4.6 4.4 4.1 3.8  CL 107 107 109 114* 112* 108  CO2 24 21* 23 22 21* 21*  GLUCOSE 150* 118* 93 120* 112* 124*  BUN 67* 57* 47* 66* 66* 58*  CREATININE 2.42* 2.09* 2.16* 2.67* 2.58* 2.63*  CALCIUM 9.6 9.1 9.4 9.6 9.4 9.3  MG 2.1 2.0 2.0 2.1 2.1 2.1  PHOS 5.2*  --   --   --   --   --    GFR: Estimated Creatinine Clearance: 13.7 mL/min (A) (by C-G formula based on SCr of 2.63 mg/dL (H)). Liver Function Tests: Recent Labs  Lab 06/06/19 0145 06/07/19 0325 06/08/19 0320 06/09/19 0355 06/10/19 0300  AST 25 22 20 21 22   ALT 21 19 20 21 21   ALKPHOS 67 88 82 73 73  BILITOT 0.5 0.6 0.5 0.5 0.6  PROT 6.2* 6.5 6.7 6.5 6.6  ALBUMIN 2.9* 3.0* 2.9* 2.8* 2.9*   No results for input(s): LIPASE, AMYLASE in the last 168 hours. No results for input(s): AMMONIA in the last 168 hours. Coagulation Profile: No results for input(s): INR, PROTIME in the last 168 hours. Cardiac Enzymes: No results for input(s): CKTOTAL, CKMB, CKMBINDEX, TROPONINI in the last 168 hours. BNP (last 3 results) No results for input(s): PROBNP in the last 8760 hours. HbA1C: No results for input(s): HGBA1C in the last 72 hours. CBG: Recent Labs  Lab 06/08/19 0744 06/08/19 1211 06/10/19 0817 06/10/19 1618 06/11/19 0831  GLUCAP 124* 181* 117*  178* 118*   Lipid Profile: No results for input(s): CHOL, HDL, LDLCALC, TRIG, CHOLHDL, LDLDIRECT in the last 72 hours. Thyroid Function Tests: No results for input(s): TSH, T4TOTAL, FREET4, T3FREE, THYROIDAB in the last 72 hours. Anemia Panel: No results for input(s): VITAMINB12, FOLATE, FERRITIN, TIBC, IRON, RETICCTPCT in the last 72 hours. Urine analysis:    Component Value Date/Time   COLORURINE YELLOW 05/21/2019 1541   APPEARANCEUR CLEAR 05/21/2019 1541   LABSPEC 1.012 05/21/2019 1541   PHURINE 5.0 05/21/2019 1541   GLUCOSEU NEGATIVE 05/21/2019 1541   HGBUR MODERATE (A) 05/21/2019 1541   BILIRUBINUR NEGATIVE 05/21/2019 1541   KETONESUR NEGATIVE 05/21/2019 1541   PROTEINUR NEGATIVE 05/21/2019 1541   UROBILINOGEN 0.2 04/27/2015 0834   NITRITE NEGATIVE 05/21/2019 1541   LEUKOCYTESUR NEGATIVE 05/21/2019 1541   No results found for this or any previous visit (from the  past 240 hour(s)).    Radiology Studies: No results found.  Scheduled Meds: . chlorhexidine  15 mL Mouth Rinse BID  . Chlorhexidine Gluconate Cloth  6 each Topical Daily  . enoxaparin (LOVENOX) injection  50 mg Subcutaneous Q24H  . feeding supplement (ENSURE ENLIVE)  237 mL Oral TID BM  . feeding supplement (PRO-STAT SUGAR FREE 64)  30 mL Oral Daily  . Ipratropium-Albuterol  1 puff Inhalation Q6H  . mouth rinse  15 mL Mouth Rinse q12n4p  . metoprolol succinate  100 mg Oral QHS  . mirtazapine  7.5 mg Oral QHS  . multivitamin with minerals  1 tablet Oral Daily  . OLANZapine  2.5 mg Oral Daily  . pantoprazole  40 mg Oral Daily  . sodium chloride flush  10-40 mL Intracatheter Q12H  . sodium chloride flush  3 mL Intravenous Q12H   Continuous Infusions: . sodium chloride 250 mL (06/08/19 2243)     LOS: 28 days   Time spent: 25 minutes.  Patrecia Pour, MD [facility] [email or web address] Password [email or web address] 06/11/2019, 10:28 AM-pn

## 2019-06-12 NOTE — Discharge Summary (Signed)
Physician Discharge Summary  Betty [name] KGM:[id number] DOB: 1946-04-16 DOA: 05/13/2019  PCP: Josetta Huddle, MD  Admit date: 05/13/2019 Discharge date: 06/12/2019  Admitted From: Home Disposition: SNF   Recommendations for Outpatient Follow-up:  1. Follow up with PCP in 1-2 weeks  Home Health: N/A Equipment/Devices: Per SNF Discharge Condition: Stable CODE STATUS: DNR Diet recommendation: Heart healthy, renal; dysphagia 3 (mechanical soft, thin liquid)  Brief/Interim Summary: Betty [name] is a 73 y.o. female with a history of dementia, CAD, chronic combined HFrEF, NSCLC 2016, COPD, stage IV CKD, HTN, and HLD who presented to the ED from home on 7/8 due to increasing confusion. She was found to have covid-19 infection and hypoxic requiring 2L O2. She was admitted to [facility] with steroids and remdesivir, decompensated and required intubation 7/10. Subsequently extubated 7/13 but reintubated that same day due to encephalopathy. Ultimately was extubated 7/21 and has experienced sustained clinical improvement. Due to functional impairments, therapy evaluators have recommended skilled nursing facility placement. A repeat test for covid-19 is negative.   Discharge Diagnoses:  Principal Problem:   COVID-19 Active Problems:   Non-small cell carcinoma of right lung, stage 1 (HCC)   CKD (chronic kidney disease), stage IV (HCC)   Chronic systolic heart failure (HCC)   Dementia (HCC)   Essential hypertension   Acute respiratory failure with hypoxemia (HCC)   Pressure injury of skin   Altered mental status  Acute hypoxic respiratory failure due to covid-19 pneumonia: Received remdesivir, tocilizumab, steroids. Extubated 7/21 and now back to room air. Repeat testing sent 8/4 is negative. Inflammatory marker (CRP) normalized 7/26.  - No further Tx indicated.   Acute metabolic encephalopathy due to sepsis due to UTI, septic shock ruled out:  - Urine culture w/E. coli, completed courses of zosyn and  ceftriaxone.  Dementia: - Delirium precautions - Continue aricept. Zyprexa was given with good effect regarding inpatient delirium while hospitalized.  Chronic combined HFrEF s/p CRT pacemaker noted on telemetry: Appears euvolemic. EF 30-35%. BNP negative. - Continue home metoprolol, lasix, bidil  New onset PAF with RVR: Rate control improving now that infection resolved.  - Continue metoprolol.  - Therapeutic lovenox given while inpatient. Given [place] discussions and history of GI bleeding, plan is to discharge on ASA and defer to PCP.   AKI on stage IV CKD: Back near baseline.  - Avoid nephrotoxins.  T2DM: HbA1c 6.9% diagnostic for diabetes. Has been on prednisone for acute gout in the past.  - SSI not currently needed as CBGs are at inpatient goal. Continue monitoring.   Lower GI bleed: Resolved. FOBT 7/26 negative.   Acute blood loss anemia: s/p 1u PRBCs. Stable.   Stage II pressure injury of the coccyx not POA:  - Offload as able - Optimize nutritional status  NSCLC, COPD: s/p RLL lobectomy and adjuvant chemotherapy  Dysphagia: Dysphagia 3 diet recommended by SLP therapy - Continue SLP evaluations and treatments.  Gout: Chronic, no flare.  - Monitor, continue home febuxostat.  Hyperlipidemia:  - Continue statin  Hypernatremia: Resolved.   Discharge Instructions  Allergies as of 06/12/2019      Reactions   Sulfa Antibiotics Itching      Medication List    STOP taking these medications   predniSONE 10 MG tablet Commonly known as: DELTASONE     TAKE these medications   acetaminophen 325 MG tablet Commonly known as: TYLENOL Take 650 mg by mouth every 6 (six) hours as needed for headache (pain).   aspirin EC 81  MG tablet Take 81 mg by mouth at bedtime.   atorvastatin 40 MG tablet Commonly known as: LIPITOR TAKE 1 TABLET BY MOUTH ONCE DAILY AT 6 PM What changed: See the new instructions.   donepezil 10 MG tablet Commonly known as:  ARICEPT Take 1 tablet (10 mg total) by mouth at bedtime.   febuxostat 40 MG tablet Commonly known as: ULORIC Take 40 mg by mouth daily.   furosemide 40 MG tablet Commonly known as: LASIX Take 20 mg by mouth daily.   isosorbide-hydrALAZINE 20-37.5 MG tablet Commonly known as: BIDIL Take 1 tablet by mouth 2 (two) times daily.   metoprolol succinate 100 MG 24 hr tablet Commonly known as: TOPROL-XL Take 1 tablet (100 mg total) by mouth daily. Pt must keep upcoming appt in August for further refills. Thanks   multivitamin with minerals Tabs tablet Take 0.5 tablets by mouth at bedtime.   nitroGLYCERIN 0.4 MG SL tablet Commonly known as: NITROSTAT Place 1 tablet (0.4 mg total) under the tongue every 5 (five) minutes x 3 doses as needed for chest pain.   Vitamin D 50 MCG (2000 UT) tablet Take 2,000 Units by mouth at bedtime.      Follow-up Information    Josetta Huddle, MD Follow up.   Specialty: Internal Medicine Contact information: [address] [place] [address] [phone]        Belva Crome, MD .   Specialty: Cardiology Contact information: [phone] [place] [address] [phone]          Allergies  Allergen Reactions  . Sulfa Antibiotics Itching    Consultations:  PCCM  Procedures/Studies: Dg Chest 2 View  Result Date: 05/14/2019 CLINICAL DATA:  Altered mental status. EXAM: CHEST - 2 VIEW COMPARISON:  One-view chest x-ray 05/22/2018 FINDINGS: Heart is enlarged. Atherosclerotic calcifications are present at the aortic arch. There is no edema or effusion. Pacing wires are stable. Lungs are hyperinflated. Mild bibasilar densities are present. IMPRESSION: 1. Stable cardiomegaly without failure. 2. Acute cardiopulmonary disease. 3. Stable changes of emphysema. 4. Bibasilar opacities likely reflect atelectasis or scarring. Electronically Signed   By: San Morelle M.D.   On: 05/14/2019 04:38   Dg Abd 1  View  Result Date: 05/18/2019 CLINICAL DATA:  NG tube placement EXAM: ABDOMEN - 1 VIEW COMPARISON:  May 15, 2019 FINDINGS: NG tube tip is in the right upper quadrant, likely in the distal stomach or proximal duodenum. Nonobstructive bowel gas pattern. IMPRESSION: NG tube tip in the Peri pyloric region. Electronically Signed   By: Rolm Baptise M.D.   On: 05/18/2019 19:25   Dg Abd 1 View  Result Date: 05/15/2019 CLINICAL DATA:  Evaluate OG tube EXAM: ABDOMEN - 1 VIEW COMPARISON:  None. FINDINGS: The OG tube terminates in the central lower abdomen, likely in either the distal gastric body or proximal antrum. IMPRESSION: The OG tube appears to terminate within the stomach. Electronically Signed   By: Dorise Bullion III M.D   On: 05/15/2019 20:02   Ct Head Wo Contrast  Result Date: 05/14/2019 CLINICAL DATA:  Altered mental status.  Known cerebral aneurysm. EXAM: CT HEAD WITHOUT CONTRAST TECHNIQUE: Contiguous axial images were obtained from the base of the skull through the vertex without intravenous contrast. COMPARISON:  Next MRI brain 05/25/2018 FINDINGS: Brain: No acute infarct, hemorrhage, or mass lesion is present. Periventricular and subcortical white matter changes bilaterally are stable. The ventricles are proportionate to the degree of atrophy. The  brainstem and cerebellum are within normal limits. No significant extraaxial fluid collection is present. 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 Aneurysm measures 8 mm. There is no evidence for rupture. Vascular calcifications are again seen at the cavernous internal carotid arteries and at the dural margin of the left vertebral artery. There is no hyperdense vessel. Skull: Calvarium is intact. No focal lytic or blastic lesions are present. No significant extracranial soft tissue lesions are present. Sinuses/Orbits: The paranasal sinuses and mastoid air cells are clear. The globes and orbits are within normal limits. IMPRESSION: 1. Stable  atrophy and white matter disease. 2. No acute intracranial abnormality. 3. 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 Electronically Signed   By: San Morelle M.D.   On: 05/14/2019 05:11   Dg Chest Port 1 View  Result Date: 05/28/2019 CLINICAL DATA:  COVID-19 EXAM: PORTABLE CHEST 1 VIEW COMPARISON:  Portable exam 1312 hours compared to 05/25/2019 FINDINGS: Feeding tube tip projects over pylorus/duodenal bulb region. LEFT subclavian pacemaker leads project over RIGHT atrium, RIGHT ventricle and coronary sinus. Enlargement of cardiac silhouette. Stable mediastinal contours. Atherosclerotic calcifications aorta. Bibasilar interstitial infiltrates and atelectasis, perhaps slightly improved on LEFT. Upper lungs clear. No pleural effusion or pneumothorax. IMPRESSION: Persistent bibasilar infiltrates and atelectasis, perhaps slightly improved on LEFT. Electronically Signed   By: Lavonia Dana M.D.   On: 05/28/2019 13:34   Dg Chest Port 1 View  Result Date: 05/25/2019 CLINICAL DATA:  Intubation. EXAM: PORTABLE CHEST 1 VIEW COMPARISON:  05/22/2019. FINDINGS: Endotracheal tube, NG tube, right IJ line in stable position. AICD in stable position. Stable cardiomegaly. Unchanged bibasilar infiltrates/edema. No pleural effusion or pneumothorax. IMPRESSION: 1.  Lines and tubes stable position. 2. AICD in stable position. Stable cardiomegaly. Bibasilar infiltrates/edema unchanged. Electronically Signed   By: Marcello Moores  Register   On: 05/25/2019 07:45   Dg Chest Port 1 View  Result Date: 05/22/2019 CLINICAL DATA:  Endotracheal tube present. EXAM: PORTABLE CHEST 1 VIEW COMPARISON:  Radiograph May 21, 2019. FINDINGS: Endotracheal and nasogastric tubes are unchanged in position. Right internal jugular catheter is unchanged. Left-sided pacemaker is unchanged. Atherosclerosis of thoracic aorta is noted. No pneumothorax is noted. Stable bibasilar opacities are noted concerning for inflammation or  atelectasis with small pleural effusions. Bony thorax is unremarkable. IMPRESSION: Stable support apparatus. Stable bibasilar opacities as described above. Electronically Signed   By: Marijo Conception M.D.   On: 05/22/2019 09:36   Dg Chest Port 1 View  Result Date: 05/21/2019 CLINICAL DATA:  Endotracheal tube present. EXAM: PORTABLE CHEST 1 VIEW COMPARISON:  Radiograph May 19, 2019. FINDINGS: Stable cardiomediastinal silhouette. Endotracheal and nasogastric tubes are unchanged in position. Left-sided pacemaker is unchanged in position. Right internal jugular catheter is unchanged in position. Stable bibasilar opacities are noted which are predominantly interstitial, concerning for edema or possibly inflammation. No significant pleural effusion is noted. Bony thorax is unremarkable. IMPRESSION: Stable support apparatus. Stable bibasilar opacities as described above. Electronically Signed   By: Marijo Conception M.D.   On: 05/21/2019 08:56   Dg Chest Port 1 View  Result Date: 05/19/2019 CLINICAL DATA:  Hx of intubation and NG placement EXAM: PORTABLE CHEST 1 VIEW COMPARISON:  Chest x-rays dated 05/18/2019 and 05/16/2019. FINDINGS: Endotracheal tube is well positioned with tip just above the level of the carina. RIGHT IJ central line appears adequately positioned within the SVC. Enteric tube passes below the diaphragm. Patchy predominantly interstitial bibasilar opacities are stable, likely a combination of interstitial edema and  small pleural effusions. No pneumothorax seen. IMPRESSION: 1. Endotracheal tube is well positioned with tip just above the level of the carina. 2. Enteric tube passes below the diaphragm. 3. Stable bibasilar opacities, predominantly interstitial, most likely a combination of interstitial edema and small layering pleural effusions, superimposed pneumonia not excluded if febrile. Electronically Signed   By: Franki Cabot M.D.   On: 05/19/2019 10:01   Portable Chest X-ray  Result Date:  05/18/2019 CLINICAL DATA:  73 year old female COVID-19. Intubated. EXAM: PORTABLE CHEST 1 VIEW COMPARISON:  05/16/2019 and earlier. FINDINGS: Portable AP supine view at 1741 hours. Endotracheal tube tip between the level the clavicles and carina. Enteric tube courses to the left upper quadrant, tip not included. Stable right IJ central line. Stable left chest pacemaker. Stable cardiac size and mediastinal contours. Increased veiling opacity at both lung bases since 05/16/2019. Obscuration of the diaphragm and dense retrocardiac opacity. No pneumothorax or pulmonary edema. Paucity of bowel gas. No acute osseous abnormality identified. IMPRESSION: 1. ET tube in good position. Enteric tube courses to the abdomen, tip not included. 2. Increased veiling opacity at both lung bases since 05/16/2019 may indicate increased bilateral pleural effusions with lower lobe collapse or consolidation. Electronically Signed   By: Genevie Ann M.D.   On: 05/18/2019 19:23   Dg Chest Port 1 View  Result Date: 05/16/2019 CLINICAL DATA:  Check endotracheal tube position EXAM: PORTABLE CHEST 1 VIEW COMPARISON:  05/15/2019 FINDINGS: Cardiac shadow is stable. Pacing device is again seen. Right jugular central line is stable. Endotracheal tube is noted approximately 7 cm above the carina slightly higher than that seen on the prior exam. Stable scarring in the bases is seen. No bony abnormality is noted. Gastric catheter is noted within the stomach. IMPRESSION: Tubes and lines as described. Electronically Signed   By: Inez Catalina M.D.   On: 05/16/2019 23:53   Dg Chest Port 1 View  Result Date: 05/15/2019 CLINICAL DATA:  Status post intubation EXAM: PORTABLE CHEST 1 VIEW COMPARISON:  Film from earlier in the same day. FINDINGS: Cardiac shadow is stable. Pacing device is again seen. Endotracheal tube is noted in satisfactory position. Right jugular central line is seen in satisfactory position. No pneumothorax is noted. Persistent scarring in  the bases is noted. IMPRESSION: Tubes and lines as described above. Electronically Signed   By: Inez Catalina M.D.   On: 05/15/2019 19:49   Dg Chest Port 1 View  Result Date: 05/15/2019 CLINICAL DATA:  Pneumonia EXAM: PORTABLE CHEST 1 VIEW COMPARISON:  May 14, 2019 FINDINGS: The multi lead left-sided pacemaker is unchanged in positioning. The heart size remains stable but enlarged. There is scarring versus atelectasis at the lung bases bilaterally. Emphysematous changes are again noted. There is some mild hyperexpansion of the lung fields bilaterally. There is no acute osseous abnormality. IMPRESSION: Stable appearance of the chest. No significant oval change from study dated 05/14/2019 Electronically Signed   By: Constance Holster M.D.   On: 05/15/2019 18:15   Dg Abd Portable 1v  Result Date: 05/19/2019 CLINICAL DATA:  Acute respiratory failure with hypoxemia. EXAM: PORTABLE ABDOMEN - 1 VIEW COMPARISON:  Plain film of the abdomen dated 05/18/2019. FINDINGS: Enteric tube appears adequately positioned in the stomach. Visualized bowel gas pattern is nonobstructive. No evidence of free intraperitoneal air. IMPRESSION: Enteric tube appears adequately positioned in the stomach. Nonobstructive bowel gas pattern. Electronically Signed   By: Franki Cabot M.D.   On: 05/19/2019 10:02     Subjective: No complaints  or overnight events reported.  Discharge Exam: Vitals:   06/12/19 0355 06/12/19 0810  BP: 122/77 (!) 105/51  Pulse: 89   Resp: 18 14  Temp: 98.1 F (36.7 C) (!) 97.5 F (36.4 C)  SpO2: 100%    General: Sleeping quietly in no distress Cardiovascular: Regular, no JVD or edema Respiratory: Nonlabored, 100% on room air  Labs: BNP (last 3 results) Recent Labs    06/07/19 0325 06/08/19 0320 06/09/19 0355  BNP 152.7* 136.4* 91.7   Basic Metabolic Panel: Recent Labs  Lab 06/06/19 0145 06/07/19 0325 06/08/19 0320 06/09/19 0355 06/10/19 0300  NA 141 142 149* 146* 142  K 5.0 4.6  4.4 4.1 3.8  CL 107 109 114* 112* 108  CO2 21* 23 22 21* 21*  GLUCOSE 118* 93 120* 112* 124*  BUN 57* 47* 66* 66* 58*  CREATININE 2.09* 2.16* 2.67* 2.58* 2.63*  CALCIUM 9.1 9.4 9.6 9.4 9.3  MG 2.0 2.0 2.1 2.1 2.1   Liver Function Tests: Recent Labs  Lab 06/06/19 0145 06/07/19 0325 06/08/19 0320 06/09/19 0355 06/10/19 0300  AST 25 22 20 21 22   ALT 21 19 20 21 21   ALKPHOS 67 88 82 73 73  BILITOT 0.5 0.6 0.5 0.5 0.6  PROT 6.2* 6.5 6.7 6.5 6.6  ALBUMIN 2.9* 3.0* 2.9* 2.8* 2.9*   No results for input(s): LIPASE, AMYLASE in the last 168 hours. No results for input(s): AMMONIA in the last 168 hours. CBC: Recent Labs  Lab 06/06/19 0145 06/07/19 0325 06/08/19 0320 06/09/19 0355 06/10/19 0300  WBC 9.5 11.1* 12.8* 11.9* 12.1*  NEUTROABS 6.6 8.2* 8.7* 7.5 7.5  HGB 12.3 11.8* 12.3 11.6* 12.3  HCT 38.4 37.0 37.7 36.1 37.8  MCV 97.2 97.1 97.2 96.8 96.7  PLT 239 278 312 331 349   Cardiac Enzymes: No results for input(s): CKTOTAL, CKMB, CKMBINDEX, TROPONINI in the last 168 hours. BNP: Invalid input(s): POCBNP CBG: Recent Labs  Lab 06/10/19 1215 06/10/19 1618 06/11/19 0831 06/11/19 1149 06/11/19 1602  GLUCAP 121* 178* 118* 103* 131*   D-Dimer No results for input(s): DDIMER in the last 72 hours. Hgb A1c No results for input(s): HGBA1C in the last 72 hours. Lipid Profile No results for input(s): CHOL, HDL, LDLCALC, TRIG, CHOLHDL, LDLDIRECT in the last 72 hours. Thyroid function studies No results for input(s): TSH, T4TOTAL, T3FREE, THYROIDAB in the last 72 hours.  Invalid input(s): FREET3 Anemia work up No results for input(s): VITAMINB12, FOLATE, FERRITIN, TIBC, IRON, RETICCTPCT in the last 72 hours. Urinalysis    Component Value Date/Time   COLORURINE YELLOW 05/21/2019 1541   APPEARANCEUR CLEAR 05/21/2019 1541   LABSPEC 1.012 05/21/2019 1541   PHURINE 5.0 05/21/2019 1541   GLUCOSEU NEGATIVE 05/21/2019 1541   HGBUR MODERATE (A) 05/21/2019 1541   BILIRUBINUR  NEGATIVE 05/21/2019 1541   KETONESUR NEGATIVE 05/21/2019 1541   PROTEINUR NEGATIVE 05/21/2019 1541   UROBILINOGEN 0.2 04/27/2015 0834   NITRITE NEGATIVE 05/21/2019 [place] 05/21/2019 1541    Microbiology Recent Results (from the past 240 hour(s))  Novel Coronavirus, NAA (hospital order; send-out to ref lab)     Status: None   Collection Time: 06/09/19  3:06 PM   Specimen: Nasopharyngeal Swab; Respiratory  Result Value Ref Range Status   SARS-CoV-2, NAA NOT DETECTED NOT DETECTED Final    Comment: (NOTE) This test was developed and its performance characteristics determined by [employer]. This test has not been FDA cleared or approved. This test has been authorized by  FDA under an Emergency Use Authorization (EUA). This test is only authorized for the duration of time the declaration that circumstances exist justifying the authorization of the emergency use of in vitro diagnostic tests for detection of SARS-CoV-2 virus and/or diagnosis of COVID-19 infection under section 564(b)(1) of the Act, 21 U.S.C. 642XIP-7(N)(5), unless the authorization is terminated or revoked sooner. When diagnostic testing is negative, the possibility of a false negative result should be considered in the context of a patient's recent exposures and the presence of clinical signs and symptoms consistent with COVID-19. An individual without symptoms of COVID-19 and who is not shedding SARS-CoV-2 virus would expect to have a negative (not detected) result in this assay. Performed  At: [facility] [place], [place] [id number] Rush Farmer MD LK:[id number]    [place]  Final    Comment: Performed at [place] [address], [place], [place] [address]    Time coordinating discharge: Approximately 40 minutes  Patrecia Pour, MD  [facility] 06/12/2019, 3:04 PM

## 2019-06-12 NOTE — NC FL2 (Addendum)
  [place] LEVEL OF CARE SCREENING TOOL     IDENTIFICATION  Patient Name: Betty [name] Birthdate: [birth year] Sex: female Admission Date (Current Location): 05/13/2019  [facility] and [place] Number:  Herbalist and Address:  ([facility])      Provider Number: [id number]  Attending Physician Name and Address:  Patrecia Pour, MD  Relative Name and Phone Number:  Chrissie Noa [phone]    Current Level of Care: Hospital Recommended Level of Care: [place] Prior Approval Number:    Date Approved/Denied:   PASRR Number: [id number]  Discharge Plan: SNF    Current Diagnoses: Patient Active Problem List   Diagnosis Date Noted  . Pressure injury of skin 05/29/2019  . Altered mental status   . Acute respiratory failure with hypoxemia ([place])   . COVID-19 05/14/2019  . Essential hypertension 05/14/2019  . Dementia ([place]) 07/16/2018  . Cerebral aneurysm, nonruptured 07/16/2018  . 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 Congestive heart failure, NYHA class 3, chronic, systolic ([place]) 09/40/7680  . Chronic systolic heart failure ([place])   . Chest pain 04/22/2018  . 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 NSTEMI (non-ST elevated myocardial infarction) ([place]) 03/24/2018  . Pure hypercholesterolemia   . CKD (chronic kidney disease), stage IV ([place])   . Non-small cell carcinoma of right lung, stage 1 ([place]) 05/17/2015  . S/P lobectomy of lung 04/29/2015    Orientation RESPIRATION BLADDER Height & Weight     Self  Normal Incontinent Weight: 56.7 kg Height:  5\' 4"  (162.6 cm)  BEHAVIORAL SYMPTOMS/MOOD NEUROLOGICAL BOWEL NUTRITION STATUS      Incontinent Dysphagia 3 diet  AMBULATORY STATUS COMMUNICATION OF NEEDS Skin   Extensive Assist Verbally PU Stage and Appropriate Care(stage 2 coccyx)                       Personal Care Assistance Level of Assistance  Bathing, Feeding, Dressing,  Total care Bathing Assistance: Maximum assistance Feeding assistance: Maximum assistance Dressing Assistance: Maximum assistance Total Care Assistance: Maximum assistance   Functional Limitations Info  Sight, Hearing, Speech Sight Info: Impaired Hearing Info: Adequate Speech Info: Adequate    SPECIAL CARE FACTORS FREQUENCY  PT (By licensed PT), OT (By licensed OT)     PT Frequency: min 5/weekly OT Frequency: min 5/weekly            Contractures Contractures Info: Not present    Additional Factors Info  Code Status Code Status Info: DNR Allergies Info: Sulfa Antibiotics     Isolation Precautions Info: emerging pathogen, air and contact precations.    Negative COVID     Current Medications (06/12/2019):     Discharge Medications: Please see discharge summary for a list of discharge medications.  Relevant Imaging Results:  Relevant Lab Results:   Additional Information : Patient does not have Tube feeds. Dys 3 diet recommended.  Self feeds. [phone]  Ninfa Meeker, RN

## 2019-06-12 NOTE — Plan of Care (Signed)
  Problem: Coping: Goal: Level of anxiety will decrease Outcome: Progressing   Problem: Pain Managment: Goal: General experience of comfort will improve Outcome: Progressing   Problem: Education: Goal: Knowledge of General Education information will improve Description: Including pain rating scale, medication(s)/side effects and non-pharmacologic comfort measures Outcome: Adequate for Discharge   Problem: Nutrition: Goal: Adequate nutrition will be maintained Outcome: Adequate for Discharge

## 2019-06-12 NOTE — TOC Progression Note (Signed)
Transition of Care [facility]) - Progression Note    Patient Details  Name: Betty [name] MRN: [id number] Date of Birth: 10-26-1946  [facility] CM/SW Contact  Loletha Grayer Beverely Pace, RN Phone Number: [phone] (working remotely) 06/12/2019, 3:27 PM  Clinical Narrative:    Case manager spoke with Winona Legato at [facility], she is reviewing patient. FL2 and DC summary have been faxed to her. Noted that patient is COVID negative as of 06/09/19 test.       Expected Discharge Plan and Services           Expected Discharge Date: 06/12/19                                     Social Determinants of Health (SDOH) Interventions    Readmission Risk Interventions No flowsheet data found.

## 2019-06-13 DIAGNOSIS — L89152 Pressure ulcer of sacral region, stage 2: Secondary | ICD-10-CM | POA: Diagnosis not present

## 2019-06-13 DIAGNOSIS — R278 Other lack of coordination: Secondary | ICD-10-CM | POA: Diagnosis not present

## 2019-06-13 DIAGNOSIS — L89151 Pressure ulcer of sacral region, stage 1: Secondary | ICD-10-CM | POA: Diagnosis present

## 2019-06-13 DIAGNOSIS — F0390 Unspecified dementia without behavioral disturbance: Secondary | ICD-10-CM | POA: Diagnosis not present

## 2019-06-13 DIAGNOSIS — J189 Pneumonia, unspecified organism: Secondary | ICD-10-CM | POA: Diagnosis present

## 2019-06-13 DIAGNOSIS — N184 Chronic kidney disease, stage 4 (severe): Secondary | ICD-10-CM | POA: Diagnosis present

## 2019-06-13 DIAGNOSIS — K2211 Ulcer of esophagus with bleeding: Secondary | ICD-10-CM | POA: Diagnosis present

## 2019-06-13 DIAGNOSIS — D649 Anemia, unspecified: Secondary | ICD-10-CM | POA: Diagnosis not present

## 2019-06-13 DIAGNOSIS — I671 Cerebral aneurysm, nonruptured: Secondary | ICD-10-CM | POA: Diagnosis not present

## 2019-06-13 DIAGNOSIS — K449 Diaphragmatic hernia without obstruction or gangrene: Secondary | ICD-10-CM | POA: Diagnosis present

## 2019-06-13 DIAGNOSIS — E872 Acidosis: Secondary | ICD-10-CM | POA: Diagnosis present

## 2019-06-13 DIAGNOSIS — J9601 Acute respiratory failure with hypoxia: Secondary | ICD-10-CM | POA: Diagnosis not present

## 2019-06-13 DIAGNOSIS — Z751 Person awaiting admission to adequate facility elsewhere: Secondary | ICD-10-CM | POA: Diagnosis not present

## 2019-06-13 DIAGNOSIS — N179 Acute kidney failure, unspecified: Secondary | ICD-10-CM | POA: Diagnosis present

## 2019-06-13 DIAGNOSIS — J984 Other disorders of lung: Secondary | ICD-10-CM | POA: Diagnosis not present

## 2019-06-13 DIAGNOSIS — F0280 Dementia in other diseases classified elsewhere without behavioral disturbance: Secondary | ICD-10-CM | POA: Diagnosis not present

## 2019-06-13 DIAGNOSIS — Z902 Acquired absence of lung [part of]: Secondary | ICD-10-CM | POA: Diagnosis not present

## 2019-06-13 DIAGNOSIS — Z7401 Bed confinement status: Secondary | ICD-10-CM | POA: Diagnosis not present

## 2019-06-13 DIAGNOSIS — E119 Type 2 diabetes mellitus without complications: Secondary | ICD-10-CM | POA: Diagnosis not present

## 2019-06-13 DIAGNOSIS — R1312 Dysphagia, oropharyngeal phase: Secondary | ICD-10-CM | POA: Diagnosis not present

## 2019-06-13 DIAGNOSIS — Y95 Nosocomial condition: Secondary | ICD-10-CM | POA: Diagnosis present

## 2019-06-13 DIAGNOSIS — D62 Acute posthemorrhagic anemia: Secondary | ICD-10-CM | POA: Diagnosis present

## 2019-06-13 DIAGNOSIS — M6281 Muscle weakness (generalized): Secondary | ICD-10-CM | POA: Diagnosis not present

## 2019-06-13 DIAGNOSIS — F0150 Vascular dementia without behavioral disturbance: Secondary | ICD-10-CM | POA: Diagnosis not present

## 2019-06-13 DIAGNOSIS — I9589 Other hypotension: Secondary | ICD-10-CM | POA: Diagnosis not present

## 2019-06-13 DIAGNOSIS — R0902 Hypoxemia: Secondary | ICD-10-CM | POA: Diagnosis not present

## 2019-06-13 DIAGNOSIS — E876 Hypokalemia: Secondary | ICD-10-CM | POA: Diagnosis present

## 2019-06-13 DIAGNOSIS — I2510 Atherosclerotic heart disease of native coronary artery without angina pectoris: Secondary | ICD-10-CM | POA: Diagnosis present

## 2019-06-13 DIAGNOSIS — E878 Other disorders of electrolyte and fluid balance, not elsewhere classified: Secondary | ICD-10-CM | POA: Diagnosis not present

## 2019-06-13 DIAGNOSIS — I5042 Chronic combined systolic (congestive) and diastolic (congestive) heart failure: Secondary | ICD-10-CM | POA: Diagnosis present

## 2019-06-13 DIAGNOSIS — L8991 Pressure ulcer of unspecified site, stage 1: Secondary | ICD-10-CM | POA: Diagnosis not present

## 2019-06-13 DIAGNOSIS — K219 Gastro-esophageal reflux disease without esophagitis: Secondary | ICD-10-CM | POA: Diagnosis present

## 2019-06-13 DIAGNOSIS — Z20828 Contact with and (suspected) exposure to other viral communicable diseases: Secondary | ICD-10-CM | POA: Diagnosis present

## 2019-06-13 DIAGNOSIS — C3490 Malignant neoplasm of unspecified part of unspecified bronchus or lung: Secondary | ICD-10-CM | POA: Diagnosis not present

## 2019-06-13 DIAGNOSIS — C3491 Malignant neoplasm of unspecified part of right bronchus or lung: Secondary | ICD-10-CM | POA: Diagnosis present

## 2019-06-13 DIAGNOSIS — N390 Urinary tract infection, site not specified: Secondary | ICD-10-CM | POA: Diagnosis not present

## 2019-06-13 DIAGNOSIS — I5041 Acute combined systolic (congestive) and diastolic (congestive) heart failure: Secondary | ICD-10-CM | POA: Diagnosis not present

## 2019-06-13 DIAGNOSIS — R5383 Other fatigue: Secondary | ICD-10-CM | POA: Diagnosis not present

## 2019-06-13 DIAGNOSIS — R079 Chest pain, unspecified: Secondary | ICD-10-CM | POA: Diagnosis not present

## 2019-06-13 DIAGNOSIS — R404 Transient alteration of awareness: Secondary | ICD-10-CM | POA: Diagnosis not present

## 2019-06-13 DIAGNOSIS — R2689 Other abnormalities of gait and mobility: Secondary | ICD-10-CM | POA: Diagnosis not present

## 2019-06-13 DIAGNOSIS — R072 Precordial pain: Secondary | ICD-10-CM | POA: Diagnosis not present

## 2019-06-13 DIAGNOSIS — G9341 Metabolic encephalopathy: Secondary | ICD-10-CM | POA: Diagnosis not present

## 2019-06-13 DIAGNOSIS — I480 Paroxysmal atrial fibrillation: Secondary | ICD-10-CM | POA: Diagnosis present

## 2019-06-13 DIAGNOSIS — R41841 Cognitive communication deficit: Secondary | ICD-10-CM | POA: Diagnosis not present

## 2019-06-13 DIAGNOSIS — Z66 Do not resuscitate: Secondary | ICD-10-CM | POA: Diagnosis present

## 2019-06-13 DIAGNOSIS — I252 Old myocardial infarction: Secondary | ICD-10-CM | POA: Diagnosis not present

## 2019-06-13 DIAGNOSIS — R410 Disorientation, unspecified: Secondary | ICD-10-CM | POA: Diagnosis not present

## 2019-06-13 DIAGNOSIS — R2681 Unsteadiness on feet: Secondary | ICD-10-CM | POA: Diagnosis not present

## 2019-06-13 DIAGNOSIS — M2550 Pain in unspecified joint: Secondary | ICD-10-CM | POA: Diagnosis not present

## 2019-06-13 DIAGNOSIS — U071 COVID-19: Secondary | ICD-10-CM | POA: Diagnosis not present

## 2019-06-13 DIAGNOSIS — E118 Type 2 diabetes mellitus with unspecified complications: Secondary | ICD-10-CM | POA: Diagnosis not present

## 2019-06-13 DIAGNOSIS — I428 Other cardiomyopathies: Secondary | ICD-10-CM | POA: Diagnosis present

## 2019-06-13 DIAGNOSIS — A4189 Other specified sepsis: Secondary | ICD-10-CM | POA: Diagnosis not present

## 2019-06-13 DIAGNOSIS — I959 Hypotension, unspecified: Secondary | ICD-10-CM | POA: Diagnosis not present

## 2019-06-13 DIAGNOSIS — K920 Hematemesis: Secondary | ICD-10-CM | POA: Diagnosis not present

## 2019-06-13 DIAGNOSIS — A419 Sepsis, unspecified organism: Secondary | ICD-10-CM | POA: Diagnosis not present

## 2019-06-13 DIAGNOSIS — I130 Hypertensive heart and chronic kidney disease with heart failure and stage 1 through stage 4 chronic kidney disease, or unspecified chronic kidney disease: Secondary | ICD-10-CM | POA: Diagnosis present

## 2019-06-13 DIAGNOSIS — E861 Hypovolemia: Secondary | ICD-10-CM | POA: Diagnosis not present

## 2019-06-13 DIAGNOSIS — Z209 Contact with and (suspected) exposure to unspecified communicable disease: Secondary | ICD-10-CM | POA: Diagnosis not present

## 2019-06-13 NOTE — Progress Notes (Signed)
Patient seen and examined again this morning. No changes to plan as detailed in discharge summary from yesterday.   Vance Gather, MD 06/13/2019 10:48 AM

## 2019-06-13 NOTE — Progress Notes (Signed)
Report given to [place] at [place].

## 2019-06-13 NOTE — Progress Notes (Signed)
ANTICOAGULATION CONSULT NOTE - Follow Up Consult  Pharmacy Consult for Lovenox Indication: atrial fibrillation  Allergies  Allergen Reactions  . Sulfa Antibiotics Itching    Patient Measurements: Height: 5\' 4"  (162.6 cm) Weight: 125 lb 10.6 oz (57 kg) IBW/kg (Calculated) : 54.7  Vital Signs: Temp: 97.5 F (36.4 C) (08/08 0825) Temp Source: Oral (08/08 0825) BP: 109/76 (08/08 0825) Pulse Rate: 100 (08/08 0825)  Labs: No results for input(s): HGB, HCT, PLT, APTT, LABPROT, INR, HEPARINUNFRC, HEPRLOWMOCWT, CREATININE, CKTOTAL, CKMB, TROPONINIHS in the last 72 hours.  Estimated Creatinine Clearance: 16.2 mL/min (A) (by C-G formula based on SCr of 2.63 mg/dL (H)).   Assessment: 73 y.o. female admitted on 05/13/2019 with COVID-19 pneumonia, requiring intubation.  Patient developed atrial fibrillation (CHA2DS2/VAS= 5), started on amiodarone which was stopped, was being AV paced. Has been on VTE prophylaxis with SQ heparin, then developed GI bleed on 7/17 with Hgb decreased 7.5 for which she was transfused.  No evidence of further bleeding and Hgb better so full-dose Lovenox started for afib on 7/19, dose was held on 7/23, but resumed on 7/24.  FOB negative on 05/31/19.  Long term plan for anticoagulation is ASA rather than full anticoagulation. We will cont the current dose while she is here since the level was appropriate. SCr up to 2.63. Hgb and plts stable on 8/5.  Goal of Therapy:  Anti-Xa level 0.6-1 units/ml (4 hours post dose) Monitor platelets by anticoagulation protocol: Yes   Plan:  Continue Lovenox 50 mg Athelstan daily. Monitor for bleeding   Albertina Parr, PharmD., BCPS Clinical Pharmacist Clinical phone for 06/13/19 until 5pm: [phone]

## 2019-06-13 NOTE — TOC Transition Note (Signed)
Transition of Care [facility]) - CM/SW Discharge Note   Patient Details  Name: Betty [name] MRN: [id number] Date of Birth: 10/31/1946  [facility] of [facility] CM/SW Contact:  Alberteen Sam, LCSW Phone Number: 06/13/2019, 9:47 AM   Clinical Narrative:     Patient will DC [id number] Anticipated DC date: 06/13/2019 Family notified:[name] Transport DB:ZMCE  Per MD patient ready for DC to [place] . RN, patient, patient's family, and facility notified of DC. Discharge Summary sent to facility. RN given number for report [phone]. DC packet on chart. Ambulance transport requested for patient.  CSW signing off.  [place], [place]   Final next level of care: Skilled Nursing Facility Barriers to Discharge: No Barriers Identified   Patient Goals and CMS Choice   CMS Medicare.gov Compare Post Acute Care list provided to:: Patient Represenative (must comment)(son Chrissie Noa) Choice offered to / list presented to : Adult Children(son Chrissie Noa)  Discharge Placement PASRR number recieved: 06/12/19            Patient chooses bed at: [facility] Patient to be transferred to facility by: [place] Name of family member notified: Chrissie Noa Patient and family notified of of transfer: 06/13/19  Discharge Plan and Services                                     Social Determinants of Health (SDOH) Interventions     Readmission Risk Interventions No flowsheet data found.

## 2019-06-13 NOTE — Progress Notes (Signed)
Attempted to call report to [facility] x 3.  Would get transferred to nurses station and then no answer.  Left my phone number with the main desk, [place], for nurse to call back for report.  If no response, will attempt again later.

## 2019-06-13 NOTE — Plan of Care (Signed)
  Problem: Clinical Measurements: Goal: Respiratory complications will improve Outcome: Progressing   Problem: Nutrition: Goal: Adequate nutrition will be maintained Outcome: Progressing   Problem: Pain Managment: Goal: General experience of comfort will improve Outcome: Progressing

## 2019-06-16 DIAGNOSIS — E118 Type 2 diabetes mellitus with unspecified complications: Secondary | ICD-10-CM | POA: Diagnosis not present

## 2019-06-16 DIAGNOSIS — J9601 Acute respiratory failure with hypoxia: Secondary | ICD-10-CM | POA: Diagnosis not present

## 2019-06-16 DIAGNOSIS — I480 Paroxysmal atrial fibrillation: Secondary | ICD-10-CM | POA: Diagnosis not present

## 2019-06-16 DIAGNOSIS — J984 Other disorders of lung: Secondary | ICD-10-CM | POA: Diagnosis not present

## 2019-06-16 DIAGNOSIS — F0280 Dementia in other diseases classified elsewhere without behavioral disturbance: Secondary | ICD-10-CM | POA: Diagnosis not present

## 2019-06-16 DIAGNOSIS — N390 Urinary tract infection, site not specified: Secondary | ICD-10-CM | POA: Diagnosis not present

## 2019-06-16 DIAGNOSIS — G9341 Metabolic encephalopathy: Secondary | ICD-10-CM | POA: Diagnosis not present

## 2019-06-16 DIAGNOSIS — A4189 Other specified sepsis: Secondary | ICD-10-CM | POA: Diagnosis not present

## 2019-06-16 DIAGNOSIS — I671 Cerebral aneurysm, nonruptured: Secondary | ICD-10-CM | POA: Diagnosis not present

## 2019-06-16 DIAGNOSIS — C3490 Malignant neoplasm of unspecified part of unspecified bronchus or lung: Secondary | ICD-10-CM | POA: Diagnosis not present

## 2019-06-16 DIAGNOSIS — I5041 Acute combined systolic (congestive) and diastolic (congestive) heart failure: Secondary | ICD-10-CM | POA: Diagnosis not present

## 2019-06-16 DIAGNOSIS — U071 COVID-19: Secondary | ICD-10-CM | POA: Diagnosis not present

## 2019-06-17 ENCOUNTER — Telehealth: Payer: Self-pay

## 2019-06-17 NOTE — Telephone Encounter (Signed)
I called Pt's Son Chrissie Noa) to switch her 8/18 OV to a VV. He stated that she was just released from the Hospital with COVID and is currently in Rehab. He will call back to reschedule when she is out.

## 2019-06-19 ENCOUNTER — Ambulatory Visit: Payer: Medicare Other | Admitting: Interventional Cardiology

## 2019-06-19 ENCOUNTER — Encounter: Payer: Self-pay | Admitting: Cardiology

## 2019-06-19 ENCOUNTER — Other Ambulatory Visit: Payer: Self-pay | Admitting: *Deleted

## 2019-06-19 DIAGNOSIS — G9341 Metabolic encephalopathy: Secondary | ICD-10-CM | POA: Diagnosis not present

## 2019-06-19 DIAGNOSIS — I5041 Acute combined systolic (congestive) and diastolic (congestive) heart failure: Secondary | ICD-10-CM | POA: Diagnosis not present

## 2019-06-19 DIAGNOSIS — J9601 Acute respiratory failure with hypoxia: Secondary | ICD-10-CM | POA: Diagnosis not present

## 2019-06-19 DIAGNOSIS — F0280 Dementia in other diseases classified elsewhere without behavioral disturbance: Secondary | ICD-10-CM | POA: Diagnosis not present

## 2019-06-19 DIAGNOSIS — I671 Cerebral aneurysm, nonruptured: Secondary | ICD-10-CM | POA: Diagnosis not present

## 2019-06-19 NOTE — Patient Outreach (Signed)
Late entry for 06/18/2019   Member assessed for potential [facility] Care Management needs as a benefit of  [place] Medicare.  Member is currently receiving rehab therapy at [facility].  Member discussed in weekly telephonic IDT meeting with facility staff, [facility] UM team, and writer on 06/18/19.  Facility reported care plan meeting had been scheduled with family. Member not participating with therapy. Cognitive issues. Unsure of disposition plan at this time.  Will continue to follow for disposition plans, progression, and for potential [facility] Care Management services.    [name], MSN-Ed, RN,BSN [place] Acute Care Coordinator [phone] [facility]) [phone]  (Toll free office)

## 2019-06-23 ENCOUNTER — Ambulatory Visit: Payer: Medicare Other | Admitting: Interventional Cardiology

## 2019-06-23 DIAGNOSIS — I671 Cerebral aneurysm, nonruptured: Secondary | ICD-10-CM | POA: Diagnosis not present

## 2019-06-23 DIAGNOSIS — J984 Other disorders of lung: Secondary | ICD-10-CM | POA: Diagnosis not present

## 2019-06-23 DIAGNOSIS — N390 Urinary tract infection, site not specified: Secondary | ICD-10-CM | POA: Diagnosis not present

## 2019-06-23 DIAGNOSIS — G9341 Metabolic encephalopathy: Secondary | ICD-10-CM | POA: Diagnosis not present

## 2019-06-23 DIAGNOSIS — U071 COVID-19: Secondary | ICD-10-CM | POA: Diagnosis not present

## 2019-06-24 ENCOUNTER — Inpatient Hospital Stay (HOSPITAL_COMMUNITY)
Admission: EM | Admit: 2019-06-24 | Discharge: 2019-07-03 | DRG: 380 | Disposition: A | Discharge status: Skilled Nursing Facility | Payer: Medicare Other | Attending: Internal Medicine | Admitting: Internal Medicine

## 2019-06-24 ENCOUNTER — Encounter (HOSPITAL_COMMUNITY): Payer: Self-pay | Admitting: Emergency Medicine

## 2019-06-24 ENCOUNTER — Emergency Department (HOSPITAL_COMMUNITY): Payer: Medicare Other

## 2019-06-24 ENCOUNTER — Other Ambulatory Visit: Payer: Self-pay

## 2019-06-24 DIAGNOSIS — N184 Chronic kidney disease, stage 4 (severe): Secondary | ICD-10-CM | POA: Diagnosis present

## 2019-06-24 DIAGNOSIS — E872 Acidosis: Secondary | ICD-10-CM | POA: Diagnosis present

## 2019-06-24 DIAGNOSIS — F0150 Vascular dementia without behavioral disturbance: Secondary | ICD-10-CM | POA: Diagnosis not present

## 2019-06-24 DIAGNOSIS — I130 Hypertensive heart and chronic kidney disease with heart failure and stage 1 through stage 4 chronic kidney disease, or unspecified chronic kidney disease: Secondary | ICD-10-CM | POA: Diagnosis present

## 2019-06-24 DIAGNOSIS — I2510 Atherosclerotic heart disease of native coronary artery without angina pectoris: Secondary | ICD-10-CM | POA: Diagnosis present

## 2019-06-24 DIAGNOSIS — E876 Hypokalemia: Secondary | ICD-10-CM | POA: Diagnosis present

## 2019-06-24 DIAGNOSIS — F0390 Unspecified dementia without behavioral disturbance: Secondary | ICD-10-CM | POA: Diagnosis present

## 2019-06-24 DIAGNOSIS — R451 Restlessness and agitation: Secondary | ICD-10-CM | POA: Diagnosis not present

## 2019-06-24 DIAGNOSIS — K920 Hematemesis: Secondary | ICD-10-CM | POA: Diagnosis not present

## 2019-06-24 DIAGNOSIS — E861 Hypovolemia: Secondary | ICD-10-CM

## 2019-06-24 DIAGNOSIS — N179 Acute kidney failure, unspecified: Secondary | ICD-10-CM | POA: Diagnosis present

## 2019-06-24 DIAGNOSIS — L89151 Pressure ulcer of sacral region, stage 1: Secondary | ICD-10-CM | POA: Diagnosis present

## 2019-06-24 DIAGNOSIS — R5383 Other fatigue: Secondary | ICD-10-CM | POA: Diagnosis not present

## 2019-06-24 DIAGNOSIS — Z7401 Bed confinement status: Secondary | ICD-10-CM | POA: Diagnosis not present

## 2019-06-24 DIAGNOSIS — M6281 Muscle weakness (generalized): Secondary | ICD-10-CM | POA: Diagnosis not present

## 2019-06-24 DIAGNOSIS — I499 Cardiac arrhythmia, unspecified: Secondary | ICD-10-CM | POA: Diagnosis not present

## 2019-06-24 DIAGNOSIS — K219 Gastro-esophageal reflux disease without esophagitis: Secondary | ICD-10-CM | POA: Diagnosis present

## 2019-06-24 DIAGNOSIS — L8991 Pressure ulcer of unspecified site, stage 1: Secondary | ICD-10-CM | POA: Diagnosis not present

## 2019-06-24 DIAGNOSIS — C3491 Malignant neoplasm of unspecified part of right bronchus or lung: Secondary | ICD-10-CM | POA: Diagnosis present

## 2019-06-24 DIAGNOSIS — I428 Other cardiomyopathies: Secondary | ICD-10-CM | POA: Diagnosis present

## 2019-06-24 DIAGNOSIS — U071 COVID-19: Secondary | ICD-10-CM | POA: Diagnosis not present

## 2019-06-24 DIAGNOSIS — Z751 Person awaiting admission to adequate facility elsewhere: Secondary | ICD-10-CM

## 2019-06-24 DIAGNOSIS — I480 Paroxysmal atrial fibrillation: Secondary | ICD-10-CM | POA: Diagnosis present

## 2019-06-24 DIAGNOSIS — K922 Gastrointestinal hemorrhage, unspecified: Secondary | ICD-10-CM | POA: Diagnosis present

## 2019-06-24 DIAGNOSIS — Z902 Acquired absence of lung [part of]: Secondary | ICD-10-CM

## 2019-06-24 DIAGNOSIS — D62 Acute posthemorrhagic anemia: Secondary | ICD-10-CM | POA: Diagnosis present

## 2019-06-24 DIAGNOSIS — L8990 Pressure ulcer of unspecified site, unspecified stage: Secondary | ICD-10-CM | POA: Diagnosis present

## 2019-06-24 DIAGNOSIS — I447 Left bundle-branch block, unspecified: Secondary | ICD-10-CM | POA: Diagnosis present

## 2019-06-24 DIAGNOSIS — R278 Other lack of coordination: Secondary | ICD-10-CM | POA: Diagnosis not present

## 2019-06-24 DIAGNOSIS — Z66 Do not resuscitate: Secondary | ICD-10-CM | POA: Diagnosis present

## 2019-06-24 DIAGNOSIS — J439 Emphysema, unspecified: Secondary | ICD-10-CM | POA: Diagnosis present

## 2019-06-24 DIAGNOSIS — I5042 Chronic combined systolic (congestive) and diastolic (congestive) heart failure: Secondary | ICD-10-CM | POA: Diagnosis present

## 2019-06-24 DIAGNOSIS — I959 Hypotension, unspecified: Secondary | ICD-10-CM | POA: Diagnosis present

## 2019-06-24 DIAGNOSIS — K449 Diaphragmatic hernia without obstruction or gangrene: Secondary | ICD-10-CM | POA: Diagnosis present

## 2019-06-24 DIAGNOSIS — Z7982 Long term (current) use of aspirin: Secondary | ICD-10-CM

## 2019-06-24 DIAGNOSIS — I469 Cardiac arrest, cause unspecified: Secondary | ICD-10-CM | POA: Diagnosis not present

## 2019-06-24 DIAGNOSIS — D649 Anemia, unspecified: Secondary | ICD-10-CM

## 2019-06-24 DIAGNOSIS — Z87891 Personal history of nicotine dependence: Secondary | ICD-10-CM

## 2019-06-24 DIAGNOSIS — I9589 Other hypotension: Secondary | ICD-10-CM | POA: Diagnosis not present

## 2019-06-24 DIAGNOSIS — Z20828 Contact with and (suspected) exposure to other viral communicable diseases: Secondary | ICD-10-CM | POA: Diagnosis present

## 2019-06-24 DIAGNOSIS — E878 Other disorders of electrolyte and fluid balance, not elsewhere classified: Secondary | ICD-10-CM | POA: Diagnosis not present

## 2019-06-24 DIAGNOSIS — M2550 Pain in unspecified joint: Secondary | ICD-10-CM | POA: Diagnosis not present

## 2019-06-24 DIAGNOSIS — Z8619 Personal history of other infectious and parasitic diseases: Secondary | ICD-10-CM | POA: Diagnosis not present

## 2019-06-24 DIAGNOSIS — R079 Chest pain, unspecified: Secondary | ICD-10-CM | POA: Diagnosis not present

## 2019-06-24 DIAGNOSIS — L89152 Pressure ulcer of sacral region, stage 2: Secondary | ICD-10-CM | POA: Diagnosis not present

## 2019-06-24 DIAGNOSIS — I252 Old myocardial infarction: Secondary | ICD-10-CM | POA: Diagnosis not present

## 2019-06-24 DIAGNOSIS — R072 Precordial pain: Secondary | ICD-10-CM | POA: Diagnosis not present

## 2019-06-24 DIAGNOSIS — K2211 Ulcer of esophagus with bleeding: Principal | ICD-10-CM | POA: Diagnosis present

## 2019-06-24 DIAGNOSIS — K2900 Acute gastritis without bleeding: Secondary | ICD-10-CM | POA: Diagnosis not present

## 2019-06-24 DIAGNOSIS — R69 Illness, unspecified: Secondary | ICD-10-CM

## 2019-06-24 DIAGNOSIS — Z950 Presence of cardiac pacemaker: Secondary | ICD-10-CM

## 2019-06-24 DIAGNOSIS — J449 Chronic obstructive pulmonary disease, unspecified: Secondary | ICD-10-CM | POA: Diagnosis not present

## 2019-06-24 DIAGNOSIS — E785 Hyperlipidemia, unspecified: Secondary | ICD-10-CM | POA: Diagnosis present

## 2019-06-24 DIAGNOSIS — J189 Pneumonia, unspecified organism: Secondary | ICD-10-CM

## 2019-06-24 DIAGNOSIS — R404 Transient alteration of awareness: Secondary | ICD-10-CM | POA: Diagnosis not present

## 2019-06-24 DIAGNOSIS — R0602 Shortness of breath: Secondary | ICD-10-CM | POA: Diagnosis not present

## 2019-06-24 DIAGNOSIS — K2981 Duodenitis with bleeding: Secondary | ICD-10-CM | POA: Diagnosis not present

## 2019-06-24 DIAGNOSIS — M109 Gout, unspecified: Secondary | ICD-10-CM | POA: Diagnosis not present

## 2019-06-24 DIAGNOSIS — Y95 Nosocomial condition: Secondary | ICD-10-CM | POA: Diagnosis present

## 2019-06-24 DIAGNOSIS — K210 Gastro-esophageal reflux disease with esophagitis: Secondary | ICD-10-CM | POA: Diagnosis not present

## 2019-06-24 DIAGNOSIS — Z209 Contact with and (suspected) exposure to unspecified communicable disease: Secondary | ICD-10-CM | POA: Diagnosis not present

## 2019-06-24 DIAGNOSIS — Z8249 Family history of ischemic heart disease and other diseases of the circulatory system: Secondary | ICD-10-CM

## 2019-06-24 DIAGNOSIS — Z85118 Personal history of other malignant neoplasm of bronchus and lung: Secondary | ICD-10-CM

## 2019-06-24 DIAGNOSIS — F419 Anxiety disorder, unspecified: Secondary | ICD-10-CM | POA: Diagnosis not present

## 2019-06-24 DIAGNOSIS — R112 Nausea with vomiting, unspecified: Secondary | ICD-10-CM | POA: Diagnosis not present

## 2019-06-24 DIAGNOSIS — I10 Essential (primary) hypertension: Secondary | ICD-10-CM | POA: Diagnosis not present

## 2019-06-24 DIAGNOSIS — R52 Pain, unspecified: Secondary | ICD-10-CM | POA: Diagnosis not present

## 2019-06-24 DIAGNOSIS — Z79899 Other long term (current) drug therapy: Secondary | ICD-10-CM

## 2019-06-24 DIAGNOSIS — Z803 Family history of malignant neoplasm of breast: Secondary | ICD-10-CM

## 2019-06-24 DIAGNOSIS — Z8719 Personal history of other diseases of the digestive system: Secondary | ICD-10-CM

## 2019-06-24 LAB — COMPREHENSIVE METABOLIC PANEL
ALT: 9 U/L (ref 0–44)
AST: 14 U/L — ABNORMAL LOW (ref 15–41)
Albumin: 1.4 g/dL — ABNORMAL LOW (ref 3.5–5.0)
Alkaline Phosphatase: 44 U/L (ref 38–126)
Anion gap: 5 (ref 5–15)
BUN: 45 mg/dL — ABNORMAL HIGH (ref 8–23)
CO2: 13 mmol/L — ABNORMAL LOW (ref 22–32)
Calcium: 4.6 mg/dL — CL (ref 8.9–10.3)
Chloride: 125 mmol/L — ABNORMAL HIGH (ref 98–111)
Creatinine, Ser: 2.43 mg/dL — ABNORMAL HIGH (ref 0.44–1.00)
GFR calc Af Amer: 22 mL/min — ABNORMAL LOW (ref >60)
GFR calc non Af Amer: 19 mL/min — ABNORMAL LOW (ref >60)
Glucose, Bld: 63 mg/dL — ABNORMAL LOW (ref 70–99)
Potassium: 2.1 mmol/L — CL (ref 3.5–5.1)
Sodium: 143 mmol/L (ref 135–145)
Total Bilirubin: 0.7 mg/dL (ref 0.3–1.2)
Total Protein: 3.1 g/dL — ABNORMAL LOW (ref 6.5–8.1)

## 2019-06-24 LAB — CBC WITH DIFFERENTIAL/PLATELET
Abs Immature Granulocytes: 0.03 10*3/uL (ref 0.00–0.07)
Basophils Absolute: 0 10*3/uL (ref 0.0–0.1)
Basophils Relative: 0 %
Eosinophils Absolute: 0 10*3/uL (ref 0.0–0.5)
Eosinophils Relative: 0 %
HCT: 21.4 % — ABNORMAL LOW (ref 36.0–46.0)
Hemoglobin: 7 g/dL — ABNORMAL LOW (ref 12.0–15.0)
Immature Granulocytes: 0 %
Lymphocytes Relative: 12 %
Lymphs Abs: 1.1 10*3/uL (ref 0.7–4.0)
MCH: 32 pg (ref 26.0–34.0)
MCHC: 32.7 g/dL (ref 30.0–36.0)
MCV: 97.7 fL (ref 80.0–100.0)
Monocytes Absolute: 0.5 10*3/uL (ref 0.1–1.0)
Monocytes Relative: 5 %
Neutro Abs: 7.5 10*3/uL (ref 1.7–7.7)
Neutrophils Relative %: 83 %
Platelets: 270 10*3/uL (ref 150–400)
RBC: 2.19 MIL/uL — ABNORMAL LOW (ref 3.87–5.11)
RDW: 14.9 % (ref 11.5–15.5)
WBC: 9.2 10*3/uL (ref 4.0–10.5)
nRBC: 0 % (ref 0.0–0.2)

## 2019-06-24 LAB — LIPASE, BLOOD: Lipase: 44 U/L (ref 11–51)

## 2019-06-24 LAB — TROPONIN I (HIGH SENSITIVITY): Troponin I (High Sensitivity): 12 ng/L (ref <18)

## 2019-06-24 LAB — PROTIME-INR
INR: 1.5 — ABNORMAL HIGH (ref 0.8–1.2)
Prothrombin Time: 17.7 seconds — ABNORMAL HIGH (ref 11.4–15.2)

## 2019-06-24 LAB — CBG MONITORING, ED: Glucose-Capillary: 104 mg/dL — ABNORMAL HIGH (ref 70–99)

## 2019-06-24 LAB — POC OCCULT BLOOD, ED: Fecal Occult Bld: NEGATIVE

## 2019-06-24 LAB — SARS CORONAVIRUS 2 BY RT PCR (HOSPITAL ORDER, PERFORMED IN ~~LOC~~ HOSPITAL LAB): SARS Coronavirus 2: NEGATIVE

## 2019-06-24 LAB — PREPARE RBC (CROSSMATCH)

## 2019-06-24 LAB — LACTIC ACID, PLASMA: Lactic Acid, Venous: 1 mmol/L (ref 0.5–1.9)

## 2019-06-24 LAB — APTT: aPTT: 29 seconds (ref 24–36)

## 2019-06-24 MED ORDER — PANTOPRAZOLE SODIUM 40 MG IV SOLR
40.0000 mg | Freq: Once | INTRAVENOUS | Status: AC
  Administered 2019-06-24: 40 mg via INTRAVENOUS
  Filled 2019-06-24: qty 40

## 2019-06-24 MED ORDER — SODIUM CHLORIDE 0.9 % IV SOLN
2.0000 g | INTRAVENOUS | Status: DC
  Filled 2019-06-24: qty 2

## 2019-06-24 MED ORDER — ONDANSETRON HCL 4 MG/2ML IJ SOLN
4.0000 mg | Freq: Four times a day (QID) | INTRAMUSCULAR | Status: DC | PRN
  Administered 2019-07-01: 4 mg via INTRAVENOUS
  Filled 2019-06-24: qty 2

## 2019-06-24 MED ORDER — ONDANSETRON HCL 4 MG PO TABS: 4.0000 mg | ORAL_TABLET | Freq: Four times a day (QID) | ORAL | Status: DC | PRN

## 2019-06-24 MED ORDER — SODIUM CHLORIDE 0.9 % IV SOLN
10.0000 mL/h | Freq: Once | INTRAVENOUS | Status: AC
  Administered 2019-06-24: 10 mL/h via INTRAVENOUS

## 2019-06-24 MED ORDER — POTASSIUM CHLORIDE 20 MEQ PO PACK
40.0000 meq | PACK | Freq: Once | ORAL | Status: DC
  Filled 2019-06-24: qty 2

## 2019-06-24 MED ORDER — VANCOMYCIN HCL IN DEXTROSE 750-5 MG/150ML-% IV SOLN: 750.0000 mg | INTRAVENOUS | Status: DC

## 2019-06-24 MED ORDER — POTASSIUM CHLORIDE 10 MEQ/100ML IV SOLN
10.0000 meq | Freq: Once | INTRAVENOUS | Status: DC
  Filled 2019-06-24: qty 100

## 2019-06-24 MED ORDER — PANTOPRAZOLE SODIUM 40 MG IV SOLR: 40.0000 mg | Freq: Two times a day (BID) | INTRAVENOUS | Status: DC

## 2019-06-24 MED ORDER — ACETAMINOPHEN 325 MG PO TABS: 650.0000 mg | ORAL_TABLET | Freq: Four times a day (QID) | ORAL | Status: DC | PRN

## 2019-06-24 MED ORDER — SODIUM CHLORIDE 0.9 % IV SOLN
8.0000 mg/h | INTRAVENOUS | Status: DC
  Administered 2019-06-24 – 2019-06-25 (×3): 8 mg/h via INTRAVENOUS
  Filled 2019-06-24 (×5): qty 80

## 2019-06-24 MED ORDER — MAGNESIUM SULFATE IN D5W 1-5 GM/100ML-% IV SOLN
1.0000 g | Freq: Once | INTRAVENOUS | Status: DC
  Filled 2019-06-24: qty 100

## 2019-06-24 MED ORDER — VANCOMYCIN HCL IN DEXTROSE 1-5 GM/200ML-% IV SOLN
1000.0000 mg | Freq: Once | INTRAVENOUS | Status: AC
  Administered 2019-06-24: 1000 mg via INTRAVENOUS
  Filled 2019-06-24: qty 200

## 2019-06-24 MED ORDER — SODIUM CHLORIDE 0.9 % IV SOLN
2.0000 g | Freq: Once | INTRAVENOUS | Status: AC
  Administered 2019-06-24: 2 g via INTRAVENOUS
  Filled 2019-06-24: qty 2

## 2019-06-24 MED ORDER — MORPHINE SULFATE (PF) 4 MG/ML IV SOLN
4.0000 mg | Freq: Once | INTRAVENOUS | Status: AC
  Administered 2019-06-24: 4 mg via INTRAVENOUS
  Filled 2019-06-24: qty 1

## 2019-06-24 MED ORDER — SODIUM CHLORIDE 0.9 % IV BOLUS
30.0000 mL/kg | Freq: Once | INTRAVENOUS | Status: AC
  Administered 2019-06-24: 1710 mL via INTRAVENOUS

## 2019-06-24 MED ORDER — HYDROCODONE-ACETAMINOPHEN 5-325 MG PO TABS
1.0000 | ORAL_TABLET | ORAL | Status: DC | PRN
  Administered 2019-06-28: 1 via ORAL
  Filled 2019-06-24: qty 1

## 2019-06-24 MED ORDER — ATORVASTATIN CALCIUM 40 MG PO TABS
40.0000 mg | ORAL_TABLET | Freq: Every day | ORAL | Status: DC
  Administered 2019-06-25 – 2019-07-02 (×8): 40 mg via ORAL
  Filled 2019-06-24 (×8): qty 1

## 2019-06-24 MED ORDER — LACTATED RINGERS IV SOLN
INTRAVENOUS | Status: DC
  Administered 2019-06-24: 22:00:00 via INTRAVENOUS

## 2019-06-24 MED ORDER — POTASSIUM CHLORIDE CRYS ER 20 MEQ PO TBCR: 40.0000 meq | EXTENDED_RELEASE_TABLET | Freq: Once | ORAL | Status: DC

## 2019-06-24 MED ORDER — POTASSIUM CHLORIDE 10 MEQ/100ML IV SOLN
10.0000 meq | Freq: Once | INTRAVENOUS | Status: AC
  Administered 2019-06-24: 10 meq via INTRAVENOUS
  Filled 2019-06-24: qty 100

## 2019-06-24 MED ORDER — MORPHINE SULFATE (PF) 4 MG/ML IV SOLN: 4.0000 mg | Freq: Once | INTRAVENOUS | Status: DC

## 2019-06-24 MED ORDER — ALBUTEROL SULFATE (2.5 MG/3ML) 0.083% IN NEBU: 3.0000 mL | INHALATION_SOLUTION | Freq: Four times a day (QID) | RESPIRATORY_TRACT | Status: DC | PRN

## 2019-06-24 MED ORDER — ONDANSETRON HCL 4 MG/2ML IJ SOLN
4.0000 mg | Freq: Once | INTRAMUSCULAR | Status: AC
  Administered 2019-06-24: 4 mg via INTRAVENOUS
  Filled 2019-06-24: qty 2

## 2019-06-24 MED ORDER — SODIUM CHLORIDE 0.9 % IV SOLN
1.0000 g | Freq: Once | INTRAVENOUS | Status: DC
  Filled 2019-06-24: qty 10

## 2019-06-24 NOTE — Progress Notes (Signed)
Pharmacy Antibiotic Note  Betty [name] is a 73 y.o. female admitted on 06/24/2019 with sepsis.  Pharmacy has been consulted for vancomycin and cefepime dosing. Hx CKD4 - SCr 2.43 LA 1, WBC 9.2, AF, soft BP  Plan: Vancomycin 1000mg  IV x1, then 750mg  every 48 hours (calc AUC 466) Cefepime 2g IV every 24 hours Monitor renal function, Cx and clinical progression to narrow Vancomycin levels at steady state     Temp (24hrs), Avg:97.4 F (36.3 C), Min:97.4 F (36.3 C), Max:97.4 F (36.3 C)  No results for input(s): WBC, CREATININE, LATICACIDVEN, VANCOTROUGH, VANCOPEAK, VANCORANDOM, GENTTROUGH, GENTPEAK, GENTRANDOM, TOBRATROUGH, TOBRAPEAK, TOBRARND, AMIKACINPEAK, AMIKACINTROU, AMIKACIN in the last 168 hours.  Estimated Creatinine Clearance: 16.2 mL/min (A) (by C-G formula based on SCr of 2.63 mg/dL (H)).    Allergies  Allergen Reactions  . Sulfa Antibiotics Itching    Antimicrobials this admission: Vanc 8/19>> Cefepime 8/19>>  Dose adjustments this admission: n/a  Microbiology results: 8/19 BCx: sent 8/19 UCx: sent  Bertis Ruddy, PharmD Clinical Pharmacist Please check AMION for all [place] numbers 06/24/2019 1:41 PM

## 2019-06-24 NOTE — Progress Notes (Signed)
Consult was placed for  IV Team to draw labs; spoke w Thurmond Butts, RN who says the pt has iv access, but phlebotomy has not been able to draw labs;  He requested that IV Team draw her labwork;  Explained this is not a function of the IV Team, to just draw labs.  He will follow up.   Raynelle Fanning RN IV Team

## 2019-06-24 NOTE — ED Provider Notes (Addendum)
[facility] EMERGENCY DEPARTMENT Provider Note   CSN: [id number] Arrival date & time: 06/24/19  1249     History   Chief Complaint Chief Complaint  Patient presents with  . Hypotension    HPI Betty [name] is a 73 y.o. female.     HPI Patient brought in by ambulance from [facility].  Patient had complained of chest pain.  She had been given 3 nitro prior to arrival and blood pressures on EMS arrival were 80/50.  Patient did vomit once prior to EMS arrival.  Report is that emesis was coffee-ground in appearance.  Patient does have baseline dementia.  She had recently had prolonged hospitalization for complicated course of [id number].  Patient is a limited historian.  She mostly is complaining of pain in her chest.  She seems to be indicating the central or slightly left anterior chest. Past Medical History:  Diagnosis Date  . Acute combined systolic and diastolic (congestive) hrt fail ([place])   . Acute mastitis of right breast 10/2003  . Arthritis    bursitis in right shoulder (05/21/2018)  . 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 CKD (chronic kidney disease), stage IV ([place])   . Colon polyps   . 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 Dementia ([place]) 07/16/2018  . Family history of breast cancer   . GERD (gastroesophageal reflux disease)   . Hypercholesterolemia   . Hypertension   . Non-small cell carcinoma of right lung, stage 1 ([place]) 05/17/2015   Stage IB, right upper lobectomy 04/29/15, chemo   . NSTEMI (non-ST elevated myocardial infarction) ([place]) 03/24/2018  . Presence of permanent cardiac pacemaker 05/21/2018  . Pure hypercholesterolemia   . Sciatica of right side   . Seasonal allergic rhinitis   . Tobacco dependence   . Tubular adenoma of colon   . Vitamin D deficiency     Patient Active Problem List   Diagnosis Date Noted  . Pressure injury of skin 05/29/2019  . Altered  mental status   . Acute respiratory failure with hypoxemia ([place])   . COVID-19 05/14/2019  . Essential hypertension 05/14/2019  . Dementia ([place]) 07/16/2018  . Cerebral aneurysm, nonruptured 07/16/2018  . 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 Congestive heart failure, NYHA class 3, chronic, systolic ([place]) 44/01/4741  . Chronic systolic heart failure ([place])   . Chest pain 04/22/2018  . 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 NSTEMI (non-ST elevated myocardial infarction) ([place]) 03/24/2018  . Pure hypercholesterolemia   . CKD (chronic kidney disease), stage IV ([place])   . Non-small cell carcinoma of right lung, stage 1 ([place]) 05/17/2015  . S/P lobectomy of lung 04/29/2015    Past Surgical History:  Procedure Laterality Date  . BIV PACEMAKER INSERTION CRT-P N/A 05/21/2018   Procedure: BIV PACEMAKER INSERTION CRT-P;  Surgeon: Deboraha Sprang, MD;  Location: [facility];  Service: Cardiovascular;  Laterality: N/A;  . BREAST SURGERY     small mass removed from left breast--benign  . CRYO INTERCOSTAL NERVE BLOCK Right 04/29/2015   Procedure: CRYO INTERCOSTAL NERVE BLOCK;  Surgeon: [name], MD;  Location: [facility];  Service: Thoracic;  Laterality: Right;  . INSERT / REPLACE / REMOVE PACEMAKER  05/21/2018  . LOBECTOMY Right 04/29/2015   Procedure: RIGHT LOWER LUNG LOBECTOMY ;  Surgeon: [name], MD;  Location: [facility];  Service: Thoracic;  Laterality: Right;  . LYMPH NODE DISSECTION  Right 04/29/2015   Procedure: RIGHT LUNG LYMPH NODE DISSECTION;  Surgeon: [name], MD;  Location: [facility];  Service: Thoracic;  Laterality: Right;  . RIGHT/LEFT HEART CATH AND CORONARY ANGIOGRAPHY N/A 04/24/2018   Procedure: RIGHT/LEFT HEART CATH AND CORONARY ANGIOGRAPHY;  Surgeon: [name], MD;  Location: [facility] CV LAB;  Service: Cardiovascular;  Laterality: N/A;  . VAGINAL DELIVERY     51 yrs ago  . VIDEO ASSISTED THORACOSCOPY (VATS)/ LOBECTOMY  Right 04/29/2015   Procedure: RIGHT VIDEO ASSISTED THORACOSCOPY (VATS) WEDGE RESECTION/ RIGHT LOWER LOBECTOMY, CRYO-ANALGESIA OF INTERCOSTAL NERVES;  Surgeon: [name], MD;  Location: [facility];  Service: Thoracic;  Laterality: Right;     OB History   No obstetric history on file.      Home Medications    Prior to Admission medications   Medication Sig Start Date End Date Taking? Authorizing Provider  acetaminophen (TYLENOL) 325 MG tablet Take 650 mg by mouth every 6 (six) hours as needed for headache (pain).     [provider]  aspirin EC 81 MG tablet Take 81 mg by mouth at bedtime.    [provider]  atorvastatin (LIPITOR) 40 MG tablet TAKE 1 TABLET BY MOUTH ONCE DAILY AT 6 PM Patient taking differently: Take 40 mg by mouth daily at 6 PM.  03/27/19   Belva Crome, MD  Cholecalciferol (VITAMIN D) 2000 units tablet Take 2,000 Units by mouth at bedtime.    [provider]  donepezil (ARICEPT) 10 MG tablet Take 1 tablet (10 mg total) by mouth at bedtime. 03/23/19   Suzzanne Cloud, NP  febuxostat (ULORIC) 40 MG tablet Take 40 mg by mouth daily.    [provider]  furosemide (LASIX) 40 MG tablet Take 20 mg by mouth daily.     [provider]  isosorbide-hydrALAZINE (BIDIL) 20-37.5 MG tablet Take 1 tablet by mouth 2 (two) times daily. 08/26/18   Deboraha Sprang, MD  metoprolol succinate (TOPROL-XL) 100 MG 24 hr tablet Take 1 tablet (100 mg total) by mouth daily. Pt must keep upcoming appt in August for further refills. Thanks 04/07/19   Belva Crome, MD  Multiple Vitamin (MULTIVITAMIN WITH MINERALS) TABS tablet Take 0.5 tablets by mouth at bedtime.    [provider]  nitroGLYCERIN (NITROSTAT) 0.4 MG SL tablet Place 1 tablet (0.4 mg total) under the tongue every 5 (five) minutes x 3 doses as needed for chest pain. 04/26/18   Daune Perch, NP    Family History Family History  Problem Relation Age of Onset  . Cancer Father   .  Cancer Sister   . Heart attack Brother   . Dementia Brother   . Other Son        bicuspid aortic valve  . Heart attack Brother   . Cancer Sister     Social History Social History   Tobacco Use  . Smoking status: Former Smoker    Packs/day: 0.75    Years: 47.00    Pack years: 35.25    Types: Cigarettes    Quit date: 04/29/2015    Years since quitting: 4.1  . Smokeless tobacco: Never Used  Substance Use Topics  . Alcohol use: Yes    Comment: 05/21/2018 "maybe 6 drinks/year"  . Drug use: Never     Allergies   Sulfa antibiotics   Review of Systems Review of Systems Level 5 caveat cannot obtain review of systems due to dementia and patient condition.  Physical Exam Updated Vital Signs BP 96/75   Pulse 81   Temp (!) 97.4 F (36.3 C) (Oral)   Resp 13   SpO2 96%   Physical Exam Constitutional:      Comments: Patient is alert and uncomfortable in appearance.  She is lying on her side clutching her chest.  She is slightly tearful.  She does not appear to be in respiratory distress.  Patient is very thin and appears under nourished and deconditioned.  HENT:     Head: Normocephalic and atraumatic.     Mouth/Throat:     Mouth: Mucous membranes are dry.     Pharynx: Oropharynx is clear.  Eyes:     Extraocular Movements: Extraocular movements intact.  Cardiovascular:     Pulses: Normal pulses.     Comments: Patient's heart sounds are distant.  She is paced on the monitor at approximately 80 bpm.  Do not appreciate gross murmur or gallop. Pulmonary:     Comments: Patient does not appear to be having difficulty breathing.  She does appear uncomfortable.  Her lungs are clear without gross rhonchi or rale.  She does have good airflow to the bases. Abdominal:     General: There is no distension.     Palpations: Abdomen is soft.     Tenderness: There is no abdominal tenderness. There is no guarding.  Genitourinary:    Comments: Stool is brown and formed.  No melena.  No  visible blood. Musculoskeletal:     Comments: Extremities are very thin and have muscular atrophy.  However no significant peripheral edema on the lower extremities.  Calves are soft.  No apparent wounds.  Skin:    General: Skin is warm and dry.     Coloration: Skin is pale.  Neurological:     Comments: No obvious focal neurologic deficit.  Patient is in distress and continues to vocalize pain.  What she is saying is clear.  She is moving her 4 extremities to roll herself onto her side and pull up her knees towards her chest and place her hands over her chest.  No evident paresis or motor deficit.  Psychiatric:     Comments: Patient is very anxious.      ED Treatments / Results  Labs (all labs ordered are listed, but only abnormal results are displayed) Labs Reviewed  CBG MONITORING, ED - Abnormal; Notable for the following components:      Result Value   Glucose-Capillary 104 (*)    All other components within normal limits  CULTURE, BLOOD (ROUTINE X 2)  CULTURE, BLOOD (ROUTINE X 2)  URINE CULTURE  SARS CORONAVIRUS 2 (HOSPITAL ORDER, [facility])  COMPREHENSIVE METABOLIC PANEL  LIPASE, BLOOD  LACTIC ACID, PLASMA  LACTIC ACID, PLASMA  CBC WITH DIFFERENTIAL/PLATELET  URINALYSIS, ROUTINE W REFLEX MICROSCOPIC  APTT  PROTIME-INR  TROPONIN I (HIGH SENSITIVITY)    EKG EKG Interpretation  Date/Time:  Wednesday June 24 2019 12:51:26 EDT Ventricular Rate:  82 PR Interval:    QRS Duration: 169 QT Interval:  475 QTC Calculation: 555 R Axis:   49 Text Interpretation:  Sinus rhythm LVH with IVCD and secondary repol abnrm similar to previous, no acute ischemic change Confirmed by Charlesetta Shanks [phone]) on 06/24/2019 12:55:50 PM   Radiology Dg Chest Port 1 View  Result Date: 06/24/2019 CLINICAL DATA:  73 year old female with chest pain. EXAM: PORTABLE CHEST 1 VIEW COMPARISON:  Chest radiograph dated 05/28/2019 FINDINGS: There is overall better  aeration the lungs with interval improvement of the left lung base densities since the prior radiograph. Some residual density at the left lung base may represent chronic changes or infiltrate. Minimal right lung base atelectatic changes noted. No lobar consolidation, pleural effusion, or pneumothorax. Stable cardiomegaly. Left pectoral pacemaker device. No acute osseous pathology. IMPRESSION: 1. No acute cardiopulmonary process. 2. Improved aeration of the left lung base since the prior radiograph and improvement in the left lung base density. Electronically Signed   By: Anner Crete M.D.   On: 06/24/2019 13:31    Procedures Procedures (including critical care time) Angiocath insertion Performed by: Charlesetta Shanks  Consent: Verbal consent obtained. Risks and benefits: risks, benefits and alternatives were discussed Time out: Immediately prior to procedure a "time out" was called to verify the correct patient, procedure, equipment, support staff and site/side marked as required.  Preparation: Patient was prepped and draped in the usual sterile fashion.  Vein Location: right basilic  Yes Ultrasound Guided  Gauge: 20G  Normal blood return and flush without difficulty Patient tolerance: Patient tolerated the procedure well with no immediate complications.  CRITICAL CARE Performed by: Charlesetta Shanks   Total critical care time: 45 minutes  Critical care time was exclusive of separately billable procedures and treating other patients.  Critical care was necessary to treat or prevent imminent or life-threatening deterioration.  Critical care was time spent personally by me on the following activities: development of treatment plan with patient and/or surrogate as well as nursing, discussions with consultants, evaluation of patient's response to treatment, examination of patient, obtaining history from patient or surrogate, ordering and performing treatments and interventions, ordering  and review of laboratory studies, ordering and review of radiographic studies, pulse oximetry and re-evaluation of patient's condition. Medications Ordered in ED Medications  vancomycin (VANCOCIN) IVPB 1000 mg/200 mL premix (has no administration in time range)  ceFEPIme (MAXIPIME) 2 g in sodium chloride 0.9 % 100 mL IVPB (has no administration in time range)  morphine 4 MG/ML injection 4 mg (4 mg Intravenous Given 06/24/19 1313)  pantoprazole (PROTONIX) injection 40 mg (40 mg Intravenous Given 06/24/19 1316)  ondansetron (ZOFRAN) injection 4 mg (4 mg Intravenous Given 06/24/19 1316)  sodium chloride 0.9 % bolus 1,710 mL (1,710 mLs Intravenous New Bag/Given 06/24/19 1352)     Initial Impression / Assessment and Plan / ED Course  I have reviewed the triage vital signs and the nursing notes.  Pertinent labs & imaging results that were available during my care of the patient were reviewed by me and considered in my medical decision making (see chart for details).         Consult:[facility] hospitalist for admission.  Patient is in rehab after prolonged complicated course of [id number] with a couple of episodes of intubation.  She apparently had been doing better but then today was sent to the emergency department for complaints of chest pain.  Patient was in distress complaining of severe anterior slightly left chest pain.  She was hypotensive.  There was a report of an episode of coffee-ground appearing emesis but none was seen in the emergency department.  Patient's stool is brown and without melena.  She is however anemic.  She does have residual left-sided infiltrate which per radiology review seems improved but could have some either acute infiltrate or atelectasis.  With what appears to be chest pain with hypotension will opt to start sepsis management with  Antibiotics for healthcare associated pneumonia.  Patient may have ulcerative disease.  She does have anemia however there has not been any  report of recurrent emesis or coffee-ground emesis.  The stool is normal in appearance.  Patient has been started on Protonix drip empirically.  Plan for admission for ongoing treatment of hypotension which is responding appropriately to fluids with anemia with plan for blood transfusion for symptomatic anemia.  Final Clinical Impressions(s) / ED Diagnoses   Final diagnoses:  Precordial pain  Symptomatic anemia  HCAP (healthcare-associated pneumonia)  Severe comorbid illness    ED Discharge Orders    None       Charlesetta Shanks, MD 06/24/19 1531    Charlesetta Shanks, MD 06/24/19 1537

## 2019-06-24 NOTE — ED Notes (Signed)
ED TO INPATIENT HANDOFF REPORT  ED Nurse Name and Phone #: Thurmond Butts [place] Name/Age/Gender Betty [name] 73 y.o. female Room/Bed: 019C/019C  Code Status   Code Status: Partial Code  Home/SNF/Other Home Patient oriented to: situation Is this baseline? Yes   Triage Complete: Triage complete  Chief Complaint HYPOTENSION COVID POS  Triage Note Pt BIB [facility] from [facility]. Per EMS pt complained of chest pain and facility administered 3 nitro with no time in between. Per EMS pt BP went to 80/50 after receiving nitro. Pt also vomited prior to EMS arrival. Emesis was coffee ground. Pt has dementia at baseline. Covid-19+. Pt paced in the 50s.   Allergies Allergies  Allergen Reactions  . Sulfa Antibiotics Itching    Level of Care/Admitting Diagnosis ED Disposition    ED Disposition Condition [facility] Area: [place] [[id number]]  Level of Care: Progressive [102]  Covid Evaluation: Confirmed COVID Negative  Diagnosis: GI bleed [[id number]]  Admitting Physician: Guilford Shi [[id number]]  Attending Physician: Guilford Shi [[id number]]  Estimated length of stay: 3 - 4 days  Certification:: I certify this patient will need inpatient services for at least 2 midnights  PT Class (Do Not Modify): Inpatient [101]  PT Acc Code (Do Not Modify): Private [1]       B Medical/Surgery History Past Medical History:  Diagnosis Date  . 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 Acute mastitis of right breast 10/2003  . Arthritis    bursitis in right shoulder (05/21/2018)  . 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 CKD (chronic kidney disease), stage IV ([place])   . Colon polyps   . 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 Dementia ([place]) 07/16/2018  . Family history of breast cancer   . GERD (gastroesophageal reflux disease)   . Hypercholesterolemia    . Hypertension   . Non-small cell carcinoma of right lung, stage 1 ([place]) 05/17/2015   Stage IB, right upper lobectomy 04/29/15, chemo   . NSTEMI (non-ST elevated myocardial infarction) ([place]) 03/24/2018  . Presence of permanent cardiac pacemaker 05/21/2018  . Pure hypercholesterolemia   . Sciatica of right side   . Seasonal allergic rhinitis   . Tobacco dependence   . Tubular adenoma of colon   . Vitamin D deficiency    Past Surgical History:  Procedure Laterality Date  . BIV PACEMAKER INSERTION CRT-P N/A 05/21/2018   Procedure: BIV PACEMAKER INSERTION CRT-P;  Surgeon: Deboraha Sprang, MD;  Location: [place] [facility];  Service: Cardiovascular;  Laterality: N/A;  . BREAST SURGERY     small mass removed from left breast--benign  . CRYO INTERCOSTAL NERVE BLOCK Right 04/29/2015   Procedure: CRYO INTERCOSTAL NERVE BLOCK;  Surgeon: [name], MD;  Location: [place];  Service: Thoracic;  Laterality: Right;  . INSERT / REPLACE / REMOVE PACEMAKER  05/21/2018  . LOBECTOMY Right 04/29/2015   Procedure: RIGHT LOWER LUNG LOBECTOMY ;  Surgeon: [name], MD;  Location: [place];  Service: Thoracic;  Laterality: Right;  . LYMPH NODE DISSECTION Right 04/29/2015   Procedure: RIGHT LUNG LYMPH NODE DISSECTION;  Surgeon: [name], MD;  Location: [place];  Service: Thoracic;  Laterality: Right;  . RIGHT/LEFT HEART CATH AND CORONARY ANGIOGRAPHY N/A 04/24/2018   Procedure: RIGHT/LEFT HEART CATH AND CORONARY ANGIOGRAPHY;  Surgeon: [name], MD;  Location: [place]  CV LAB;  Service: Cardiovascular;  Laterality: N/A;  . VAGINAL DELIVERY     51 yrs ago  . VIDEO ASSISTED THORACOSCOPY (VATS)/ LOBECTOMY Right 04/29/2015   Procedure: RIGHT VIDEO ASSISTED THORACOSCOPY (VATS) WEDGE RESECTION/ RIGHT LOWER LOBECTOMY, CRYO-ANALGESIA OF INTERCOSTAL NERVES;  Surgeon: [name], MD;  Location: [place];  Service: Thoracic;  Laterality: Right;     A IV  Location/Drains/Wounds Patient Lines/Drains/Airways Status   Active Line/Drains/Airways    Name:   Placement date:   Placement time:   Site:   Days:   Peripheral IV 06/24/19 Right Hand   06/24/19    1303    Hand   less than 1   Peripheral IV 06/24/19 Right Arm   06/24/19    1405    Arm   less than 1   Incision (Closed) 05/21/18 Chest Left;Upper   05/21/18    1510     399   Pressure Injury 05/29/19 Coccyx Mid Stage II -  Partial thickness loss of dermis presenting as a shallow open ulcer with a red, pink wound bed without slough.   05/29/19    0400     26          Intake/Output Last 24 hours  Intake/Output Summary (Last 24 hours) at 06/24/2019 1804 Last data filed at 06/24/2019 1639 Gross per 24 hour  Intake 2010 ml  Output -  Net 2010 ml    Labs/Imaging Results for orders placed or performed during the hospital encounter of 06/24/19 (from the past 48 hour(s))  CBG monitoring, ED     Status: Abnormal   Collection Time: 06/24/19 12:57 PM  Result Value Ref Range   Glucose-Capillary 104 (H) 70 - 99 mg/dL   Comment 1 Notify RN    Comment 2 Document in Chart   SARS Coronavirus 2 [facility] order, Performed in [facility] hospital lab) Nasopharyngeal Nasopharyngeal Swab     Status: None   Collection Time: 06/24/19  1:42 PM   Specimen: Nasopharyngeal Swab  Result Value Ref Range   SARS Coronavirus 2 NEGATIVE NEGATIVE    Comment: (NOTE) If result is NEGATIVE SARS-CoV-2 target nucleic acids are NOT DETECTED. The SARS-CoV-2 RNA is generally detectable in upper and lower  respiratory specimens during the acute phase of infection. The lowest  concentration of SARS-CoV-2 viral copies this assay can detect is 250  copies / mL. A negative result does not preclude SARS-CoV-2 infection  and should not be used as the sole basis for treatment or other  patient management decisions.  A negative result may occur with  improper specimen collection / handling, submission of specimen other  than  nasopharyngeal swab, presence of viral mutation(s) within the  areas targeted by this assay, and inadequate number of viral copies  (<250 copies / mL). A negative result must be combined with clinical  observations, patient history, and epidemiological information. If result is POSITIVE SARS-CoV-2 target nucleic acids are DETECTED. The SARS-CoV-2 RNA is generally detectable in upper and lower  respiratory specimens dur ing the acute phase of infection.  Positive  results are indicative of active infection with SARS-CoV-2.  Clinical  correlation with patient history and other diagnostic information is  necessary to determine patient infection status.  Positive results do  not rule out bacterial infection or co-infection with other viruses. If result is PRESUMPTIVE POSTIVE SARS-CoV-2 nucleic acids MAY BE PRESENT.   A presumptive positive result was obtained on the submitted specimen  and confirmed on repeat testing.  While 2019 novel coronavirus  (SARS-CoV-2) nucleic acids may be present in the submitted sample  additional confirmatory testing may be necessary for epidemiological  and / or clinical management purposes  to differentiate between  SARS-CoV-2 and other Sarbecovirus currently known to infect humans.  If clinically indicated additional testing with an alternate test  methodology [phone]) is advised. The SARS-CoV-2 RNA is generally  detectable in upper and lower respiratory sp ecimens during the acute  phase of infection. The expected result is Negative. Fact Sheet for Patients:  StrictlyIdeas.no Fact Sheet for Healthcare Providers: BankingDealers.co.za This test is not yet approved or cleared by the [place] FDA and has been authorized for detection and/or diagnosis of SARS-CoV-2 by FDA under an Emergency Use Authorization (EUA).  This EUA will remain in effect (meaning this test can be used) for the duration of  the COVID-19 declaration under Section 564(b)(1) of the Act, 21 U.S.C. section 360bbb-3(b)(1), unless the authorization is terminated or revoked sooner. Performed at [facility], [place] [address], [place], [place] [address]   Comprehensive metabolic panel     Status: Abnormal   Collection Time: 06/24/19  1:48 PM  Result Value Ref Range   Sodium 143 135 - 145 mmol/L   Potassium 2.1 (LL) 3.5 - 5.1 mmol/L    Comment: CRITICAL RESULT CALLED TO, READ BACK BY AND VERIFIED WITH: Rakan Soffer,R RN @1446  ON [id number] BY FLEMINGS    Chloride 125 (H) 98 - 111 mmol/L   CO2 13 (L) 22 - 32 mmol/L   Glucose, Bld 63 (L) 70 - 99 mg/dL   BUN 45 (H) 8 - 23 mg/dL   Creatinine, Ser 2.43 (H) 0.44 - 1.00 mg/dL   Calcium 4.6 (LL) 8.9 - 10.3 mg/dL    Comment: CRITICAL RESULT CALLED TO, READ BACK BY AND VERIFIED WITH: Vondra Aldredge,R RN @1446  ON [id number] BY FLEMINGS    Total Protein 3.1 (L) 6.5 - 8.1 g/dL   Albumin 1.4 (L) 3.5 - 5.0 g/dL   AST 14 (L) 15 - 41 U/L   ALT 9 0 - 44 U/L   Alkaline Phosphatase 44 38 - 126 U/L   Total Bilirubin 0.7 0.3 - 1.2 mg/dL   GFR calc non Af Amer 19 (L) >60 mL/min   GFR calc Af Amer 22 (L) >60 mL/min   Anion gap 5 5 - 15    Comment: Performed at [facility], [place] [address], [place], [place] [address]  Lipase, blood     Status: None   Collection Time: 06/24/19  1:48 PM  Result Value Ref Range   Lipase 44 11 - 51 U/L    Comment: Performed at [facility] [address] [place], [place] [address]  Troponin I (High Sensitivity)     Status: None   Collection Time: 06/24/19  1:48 PM  Result Value Ref Range   Troponin I (High Sensitivity) 12 <18 ng/L    Comment: (NOTE) Elevated high sensitivity troponin I (hsTnI) values and significant  changes across serial measurements may suggest ACS but many other  chronic and acute conditions are known to elevate hsTnI results.  Refer to the Links section for chest pain algorithms and additional  guidance. Performed at [facility], [place] [address], [place], [place] [address]   CBC with Differential     Status: Abnormal   Collection Time: 06/24/19  1:48 PM  Result Value Ref Range   WBC 9.2 4.0 - 10.5 K/uL  RBC 2.19 (L) 3.87 - 5.11 MIL/uL   Hemoglobin 7.0 (L) 12.0 - 15.0 g/dL   HCT 21.4 (L) 36.0 - 46.0 %   MCV 97.7 80.0 - 100.0 fL   MCH 32.0 26.0 - 34.0 pg   MCHC 32.7 30.0 - 36.0 g/dL   RDW 14.9 11.5 - 15.5 %   Platelets 270 150 - 400 K/uL   nRBC 0.0 0.0 - 0.2 %   Neutrophils Relative % 83 %   Neutro Abs 7.5 1.7 - 7.7 K/uL   Lymphocytes Relative 12 %   Lymphs Abs 1.1 0.7 - 4.0 K/uL   Monocytes Relative 5 %   Monocytes Absolute 0.5 0.1 - 1.0 K/uL   Eosinophils Relative 0 %   Eosinophils Absolute 0.0 0.0 - 0.5 K/uL   Basophils Relative 0 %   Basophils Absolute 0.0 0.0 - 0.1 K/uL   Immature Granulocytes 0 %   Abs Immature Granulocytes 0.03 0.00 - 0.07 K/uL    Comment: Performed at [facility] [address] [place], [place] [address]  Lactic acid, plasma     Status: None   Collection Time: 06/24/19  1:50 PM  Result Value Ref Range   Lactic Acid, Venous 1.0 0.5 - 1.9 mmol/L    Comment: Performed at [facility] [address] [place], [place] [address]  APTT     Status: None   Collection Time: 06/24/19  1:50 PM  Result Value Ref Range   aPTT 29 24 - 36 seconds    Comment: Performed at [facility] [address], [place], [place] [address]  Protime-INR     Status: Abnormal   Collection Time: 06/24/19  1:50 PM  Result Value Ref Range   Prothrombin Time 17.7 (H) 11.4 - 15.2 seconds   INR 1.5 (H) 0.8 - 1.2    Comment: (NOTE) INR goal varies based on device and disease states. Performed at [facility], [place] [address], [place], [place] [address]   Prepare RBC     Status: None   Collection Time: 06/24/19  2:48 PM  Result Value Ref Range   Order Confirmation      ORDER PROCESSED BY BLOOD BANK Performed at [facility], [place] [address] [place],  [place] [address]   Type and screen [place]     Status: None (Preliminary result)   Collection Time: 06/24/19  2:48 PM  Result Value Ref Range   ABO/RH(D) A POS    Antibody Screen NEG    Sample Expiration 06/27/2019,2359    Unit Number [id number]    Blood Component Type RBC LR PHER2    Unit division 00    Status of Unit ISSUED    Transfusion Status OK TO TRANSFUSE    Crossmatch Result      Compatible Performed at [facility], [place] [address], [place], [place] [address]    Unit Number [id number]    Blood Component Type RED CELLS,LR    Unit division 00    Status of Unit ALLOCATED    Transfusion Status OK TO TRANSFUSE    Crossmatch Result Compatible   POC occult blood, ED     Status: None   Collection Time: 06/24/19  3:29 PM  Result Value Ref Range   Fecal Occult Bld NEGATIVE NEGATIVE   Dg Chest Port 1 View  Result Date: 06/24/2019 CLINICAL DATA:  73 year old female with chest pain. EXAM: PORTABLE CHEST 1 VIEW COMPARISON:  Chest radiograph dated 05/28/2019 FINDINGS: There is overall better aeration the lungs with interval improvement of the left lung base densities since the prior radiograph. Some residual density at the left lung base may represent chronic changes or infiltrate. Minimal right lung base atelectatic changes noted. No lobar consolidation, pleural effusion, or pneumothorax. Stable cardiomegaly. Left pectoral pacemaker device. No acute osseous pathology. IMPRESSION: 1. No acute cardiopulmonary process. 2. Improved aeration of the left lung base since the prior radiograph and improvement in the left lung base density. Electronically Signed   By: Anner Crete M.D.   On: 06/24/2019 13:31    Pending Labs Unresulted Labs (From admission, onward)    Start     Ordered   06/24/19 0272  Basic metabolic panel  Once,   STAT     06/24/19 1654   06/24/19 1801  CBC  Once,   STAT     06/24/19 1801   06/24/19 1800  Parathyroid hormone, intact (no Ca)   Once,   STAT     06/24/19 1800   06/24/19 1800  VITAMIN D 25 Hydroxy (Vit-D Deficiency, Fractures)  Once,   STAT     06/24/19 1800   06/24/19 1649  Magnesium  Once,   STAT    Comments: MAY ADD ON    06/24/19 1654   06/24/19 1649  Phosphorus  Once,   STAT    Comments: MAY ADD ON    06/24/19 1654   06/24/19 1552  Calcium, ionized  ONCE - STAT,   STAT     06/24/19 1551   06/24/19 1336  Urine culture  ONCE - STAT,   STAT     06/24/19 1336   06/24/19 1303  Urinalysis, Routine w reflex microscopic  ONCE - STAT,   STAT     06/24/19 1302   06/24/19 1303  Culture, blood (routine x 2)  BLOOD CULTURE X 2,   STAT     06/24/19 1302   06/24/19 1302  Lactic acid, plasma  Now then every 2 hours,   STAT     06/24/19 1302          Vitals/Pain Today's Vitals   06/24/19 1600 06/24/19 1619 06/24/19 1634 06/24/19 1640  BP: (!) 85/49 (!) 86/66 (!) 92/51 103/63  Pulse: 79 81 81 78  Resp: 15 15 15 20   Temp:  (!) 97.4 F (36.3 C) (!) 97.5 F (36.4 C)   TempSrc:   Oral   SpO2: 93%   95%    Isolation Precautions Airborne and Contact precautions  Medications Medications  pantoprazole (PROTONIX) 80 mg in sodium chloride 0.9 % 250 mL (0.32 mg/mL) infusion (8 mg/hr Intravenous New Bag/Given 06/24/19 1708)  ceFEPIme (MAXIPIME) 2 g in sodium chloride 0.9 % 100 mL IVPB (has no administration in time range)  calcium chloride 1 g in sodium chloride 0.9 % 100 mL IVPB (has no administration in time range)  acetaminophen (TYLENOL) tablet 650 mg (has no administration in time range)  atorvastatin (LIPITOR) tablet 40 mg (has no administration in time range)  albuterol (PROVENTIL) (2.5 MG/3ML) 0.083% nebulizer solution 3 mL (has no administration in time range)  HYDROcodone-acetaminophen (NORCO/VICODIN) 5-325 MG per tablet 1-2 tablet (has no administration in time range)  ondansetron (ZOFRAN) tablet 4 mg (has no administration in time range)    Or  ondansetron (ZOFRAN) injection 4 mg (has no administration  in time range)  lactated ringers infusion (has no administration in time range)  potassium chloride 10 mEq in  100 mL IVPB (has no administration in time range)  potassium chloride (KLOR-CON) packet 40 mEq (has no administration in time range)  magnesium sulfate IVPB 1 g 100 mL (has no administration in time range)  morphine 4 MG/ML injection 4 mg (4 mg Intravenous Given 06/24/19 1313)  pantoprazole (PROTONIX) injection 40 mg (40 mg Intravenous Given 06/24/19 1316)  ondansetron (ZOFRAN) injection 4 mg (4 mg Intravenous Given 06/24/19 1316)  sodium chloride 0.9 % bolus 1,710 mL (0 mL/kg  57 kg Intravenous Stopped 06/24/19 1639)  vancomycin (VANCOCIN) IVPB 1000 mg/200 mL premix (0 mg Intravenous Stopped 06/24/19 1605)  ceFEPIme (MAXIPIME) 2 g in sodium chloride 0.9 % 100 mL IVPB (0 g Intravenous Stopped 06/24/19 1605)  0.9 %  sodium chloride infusion (10 mL/hr Intravenous New Bag/Given 06/24/19 1635)  potassium chloride 10 mEq in 100 mL IVPB (10 mEq Intravenous New Bag/Given 06/24/19 1645)    Mobility walks with person assist High fall risk   Focused Assessments    R Recommendations: See Admitting Provider Note  Report given to:   Additional Notes:

## 2019-06-24 NOTE — ED Notes (Signed)
Stuck pt once unable to get labs

## 2019-06-24 NOTE — ED Triage Notes (Signed)
Pt BIB [facility] from [facility]. Per EMS pt complained of chest pain and facility administered 3 nitro with no time in between. Per EMS pt BP went to 80/50 after receiving nitro. Pt also vomited prior to EMS arrival. Emesis was coffee ground. Pt has dementia at baseline. Covid-19+. Pt paced in the 50s.

## 2019-06-24 NOTE — ED Notes (Signed)
The RN went in to check on pt. Pt had pulled IV out of her upper arm that blood transfusion was running through. Charge nurse consulted and this RN stopped medication going through iv in hand and restarted blood transfusion.

## 2019-06-24 NOTE — H&P (Addendum)
History and Physical    DOA: 06/24/2019  PCP: Josetta Huddle, MD  Patient coming from: [facility]  Chief Complaint: Sent for evaluation of chest pain/hypotension and coffee-ground emesis  HPI: Betty [name] is a 73 y.o. 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 She was found to have COVID-19 infection and hypoxic requiring 2L O2. She was admitted to [facility] with steroids and remdesivir, decompensated and required intubation 7/10. Subsequently extubated 7/13 but reintubated due to metabolic encephalopathy in the setting of Ecoli Urosepsis. Ultimately, she was extubated 7/21 and transferred to [facility] on 8/8. Hospitalization was complicated by new onset paroxysmal A. fib with RVR, lower GI bleed and stage II coccygeal pressure injury.  She was discharged on metoprolol/aspirin for A. fib but discontinued therapeutic Lovenox in the setting of lower GI bleed. It appears that patient was discharged with instructions for dysphagia 3 diet and her CODE STATUS was DNR. Patient presents today to the ED from [facility] rehab with complaints of chest pain and reportedly an episode of coffee-ground emesis. Per ED report, EMS was summoned by the facility after patient complained of chest pain and blood pressure dropped after administration of nitro SL x3.  Upon EMS arrival blood pressure was 80/50 and improved to systolic 638V after she received 200 mL fluid bolus.  Noted to be hypertensive/hypothermic on arrival to the ED here.  Stool formed, brown and guaiac pending per ED physician.  Labs revealed normal WBC, hemoglobin dropped from 12.3--> 7, sodium 143, potassium 2.1, chloride 125, bicarb of 13, BUN 45 (down from 58 on 8/5) and creatinine 2.43 (down from 2.63 on 8/5)  and calcium of 4.6 (down from 9.3), albumin of 1.4 (down from 2.9 on 8/5) and lactate of 1.0.  EKG shows paced rhythm with QTC of 555 ms.  Chest x-ray revealed no acute abnormalities and improvement in prior left lung base opacity.  She has received 1.2 L of IV fluid bolus (additional 500 ml pending), broad-spectrum antibiotics including Vanco/Maxipime for possible sepsis in the setting of hypothermia/hypotension and initiated on Protonix drip for GI bleed. She has received 81meq IV potassium and 4mg  morphine IV for chest pain in the ED.Today she is COVID negative.  Per my discussion with patient's son, she has not been eating well at the rehab facility.  At baseline she did have mild dementia with.'s of disorientation but was able to hold normal conversation prior to hospitalization in July.  He states she did have significant encephalopathy during the hospitalization and improved at the time of discharge but not quite back to her baseline.  He is not aware of patient getting prior EGD or colonoscopy.  Review of Systems: As per HPI otherwise 10 point review of systems negative.    Past Medical History:  Diagnosis Date  . Acute combined systolic and diastolic (congestive) hrt fail ([place])   . Acute mastitis of right breast 10/2003  . Arthritis    bursitis in right shoulder (05/21/2018)  . 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 CKD (chronic kidney disease), stage IV ([place])   . Colon polyps   . 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 Dementia ([place]) 07/16/2018  . Family history of breast cancer   .  GERD (gastroesophageal reflux disease)   . Hypercholesterolemia   . Hypertension   . Non-small cell carcinoma of right lung, stage 1 ([place]) 05/17/2015   Stage IB, right upper lobectomy 04/29/15, chemo   . NSTEMI (non-ST elevated myocardial infarction) ([place]) 03/24/2018  . Presence of permanent cardiac pacemaker 05/21/2018  . Pure  hypercholesterolemia   . Sciatica of right side   . Seasonal allergic rhinitis   . Tobacco dependence   . Tubular adenoma of colon   . Vitamin D deficiency     Past Surgical History:  Procedure Laterality Date  . BIV PACEMAKER INSERTION CRT-P N/A 05/21/2018   Procedure: BIV PACEMAKER INSERTION CRT-P;  Surgeon: Deboraha Sprang, MD;  Location: [place] [facility];  Service: Cardiovascular;  Laterality: N/A;  . BREAST SURGERY     small mass removed from left breast--benign  . CRYO INTERCOSTAL NERVE BLOCK Right 04/29/2015   Procedure: CRYO INTERCOSTAL NERVE BLOCK;  Surgeon: [name], MD;  Location: [place];  Service: Thoracic;  Laterality: Right;  . INSERT / REPLACE / REMOVE PACEMAKER  05/21/2018  . LOBECTOMY Right 04/29/2015   Procedure: RIGHT LOWER LUNG LOBECTOMY ;  Surgeon: [name], MD;  Location: [place];  Service: Thoracic;  Laterality: Right;  . LYMPH NODE DISSECTION Right 04/29/2015   Procedure: RIGHT LUNG LYMPH NODE DISSECTION;  Surgeon: [name], MD;  Location: [place];  Service: Thoracic;  Laterality: Right;  . RIGHT/LEFT HEART CATH AND CORONARY ANGIOGRAPHY N/A 04/24/2018   Procedure: RIGHT/LEFT HEART CATH AND CORONARY ANGIOGRAPHY;  Surgeon: [name], MD;  Location: [place] [facility];  Service: Cardiovascular;  Laterality: N/A;  . VAGINAL DELIVERY     51 yrs ago  . VIDEO ASSISTED THORACOSCOPY (VATS)/ LOBECTOMY Right 04/29/2015   Procedure: RIGHT VIDEO ASSISTED THORACOSCOPY (VATS) WEDGE RESECTION/ RIGHT LOWER LOBECTOMY, CRYO-ANALGESIA OF INTERCOSTAL NERVES;  Surgeon: [name], MD;  Location: [place];  Service: Thoracic;  Laterality: Right;    Social history:  reports that she quit smoking about 4 years ago. Her smoking use included cigarettes. She has a 35.25 pack-year smoking history. She has never used smokeless tobacco. She reports current alcohol use. She reports that she does not use drugs.   Allergies  Allergen Reactions  .  Sulfa Antibiotics Itching    Family History  Problem Relation Age of Onset  . Cancer Father   . Cancer Sister   . Heart attack Brother   . Dementia Brother   . Other Son        bicuspid aortic valve  . Heart attack Brother   . Cancer Sister       Prior to Admission medications   Medication Sig Start Date End Date Taking? Authorizing Provider  acetaminophen (TYLENOL) 325 MG tablet Take 650 mg by mouth every 6 (six) hours as needed for mild pain or moderate pain.   Yes [provider]  acetaminophen (TYLENOL) 500 MG tablet Take 1,000 mg by mouth every 8 (eight) hours.    Yes [provider]  albuterol (VENTOLIN HFA) 108 (90 Base) MCG/ACT inhaler Inhale 2 puffs into the lungs every 6 (six) hours as needed for wheezing or shortness of breath.   Yes [provider]  aspirin EC 81 MG tablet Take 81 mg by mouth at bedtime.   Yes [provider]  atorvastatin (LIPITOR) 40 MG tablet TAKE 1 TABLET BY MOUTH ONCE DAILY AT 6 PM Patient taking differently: Take 40 mg by mouth daily  at 6 PM.  03/27/19  Yes Belva Crome, MD  Cholecalciferol (VITAMIN D) 2000 units tablet Take 2,000 Units by mouth at bedtime.   Yes [provider]  donepezil (ARICEPT) 10 MG tablet Take 1 tablet (10 mg total) by mouth at bedtime. 03/23/19  Yes Suzzanne Cloud, NP  febuxostat (ULORIC) 40 MG tablet Take 40 mg by mouth at bedtime.    Yes [provider]  furosemide (LASIX) 40 MG tablet Take 20 mg by mouth daily.    Yes [provider]  hydrOXYzine (ATARAX/VISTARIL) 10 MG tablet Take 10 mg by mouth 2 (two) times daily. Anxiety   Yes [provider]  isosorbide-hydrALAZINE (BIDIL) 20-37.5 MG tablet Take 1 tablet by mouth 2 (two) times daily. 08/26/18  Yes Deboraha Sprang, MD  metoprolol succinate (TOPROL-XL) 100 MG 24 hr tablet Take 1 tablet (100 mg total) by mouth daily. Pt must keep upcoming appt in August for further refills. Thanks 04/07/19  Yes Belva Crome, MD  Multiple Vitamin (MULTIVITAMIN WITH MINERALS) TABS tablet Take 0.5 tablets by mouth at bedtime.   Yes [provider]  nitroGLYCERIN (NITROSTAT) 0.4 MG SL tablet Place 1 tablet (0.4 mg total) under the tongue every 5 (five) minutes x 3 doses as needed for chest pain. 04/26/18  Yes Daune Perch, NP    Physical Exam: Vitals:   06/24/19 1600 06/24/19 1619 06/24/19 1634 06/24/19 1640  BP: (!) 85/49 (!) 86/66 (!) 92/51 103/63  Pulse: 79 81 81 78  Resp: 15 15 15 20   Temp:  (!) 97.4 F (36.3 C) (!) 97.5 F (36.4 C)   TempSrc:   Oral   SpO2: 93%   95%    Constitutional: NAD, calm, comfortable.  Denies any chest pain currently Eyes: PERRL, lids and conjunctivae normal ENMT: Mucous membranes are moist. Posterior pharynx clear of any exudate or lesions.Normal dentition.  Neck: normal, supple, no masses, no thyromegaly Respiratory: clear to auscultation bilaterally, no wheezing, no crackles. Normal respiratory effort. No accessory muscle use.  Cardiovascular: Regular rate and rhythm, no murmurs / rubs / gallops. No extremity edema. 2+ pedal pulses. No carotid bruits.  Abdomen: no tenderness, no masses palpated. No hepatosplenomegaly. Bowel sounds positive.  Musculoskeletal: no clubbing / cyanosis. No joint deformity upper and lower extremities. Good ROM, no contractures. Normal muscle tone.  Neurologic: CN 2-12 grossly intact. Sensation intact, DTR normal. Strength 5/5 in all 4.  Oriented to person only. Psychiatric:  Alert and oriented x 1.  Judgment impaired.  Normal mood.  SKIN/catheters: Coccygeal pressure injury stage I  Labs on Admission: I have personally reviewed following labs and imaging studies  CBC: Recent Labs  Lab 06/24/19 1348  WBC 9.2  NEUTROABS 7.5  HGB 7.0*  HCT 21.4*  MCV 97.7  PLT 474   Basic Metabolic Panel: Recent Labs  Lab 06/24/19 1348  NA 143  K 2.1*  CL 125*  CO2 13*  GLUCOSE 63*  BUN 45*  CREATININE 2.43*  CALCIUM 4.6*    GFR: Estimated Creatinine Clearance: 17.5 mL/min (A) (by C-G formula based on SCr of 2.43 mg/dL (H)). Liver Function Tests: Recent Labs  Lab 06/24/19 1348  AST 14*  ALT 9  ALKPHOS 44  BILITOT 0.7  PROT 3.1*  ALBUMIN 1.4*   Recent Labs  Lab 06/24/19 1348  LIPASE 44   No results for input(s): AMMONIA in the last 168 hours. Coagulation Profile: Recent Labs  Lab 06/24/19 1350  INR 1.5*   Cardiac  Enzymes: No results for input(s): CKTOTAL, CKMB, CKMBINDEX, TROPONINI in the last 168 hours. BNP (last 3 results) No results for input(s): PROBNP in the last 8760 hours. HbA1C: No results for input(s): HGBA1C in the last 72 hours. CBG: Recent Labs  Lab 06/24/19 1257  GLUCAP 104*   Lipid Profile: No results for input(s): CHOL, HDL, LDLCALC, TRIG, CHOLHDL, LDLDIRECT in the last 72 hours. Thyroid Function Tests: No results for input(s): TSH, T4TOTAL, FREET4, T3FREE, THYROIDAB in the last 72 hours. Anemia Panel: No results for input(s): VITAMINB12, FOLATE, FERRITIN, TIBC, IRON, RETICCTPCT in the last 72 hours. Urine analysis:    Component Value Date/Time   COLORURINE YELLOW 05/21/2019 1541   APPEARANCEUR CLEAR 05/21/2019 1541   LABSPEC 1.012 05/21/2019 1541   PHURINE 5.0 05/21/2019 1541   GLUCOSEU NEGATIVE 05/21/2019 1541   HGBUR MODERATE (A) 05/21/2019 1541   BILIRUBINUR NEGATIVE 05/21/2019 1541   KETONESUR NEGATIVE 05/21/2019 1541   PROTEINUR NEGATIVE 05/21/2019 1541   UROBILINOGEN 0.2 04/27/2015 0834   NITRITE NEGATIVE 05/21/2019 1541   LEUKOCYTESUR NEGATIVE 05/21/2019 1541    Radiological Exams on Admission: Personally reviewed  Dg Chest Port 1 View  Result Date: 06/24/2019 CLINICAL DATA:  73 year old female with chest pain. EXAM: PORTABLE CHEST 1 VIEW COMPARISON:  Chest radiograph dated 05/28/2019 FINDINGS: There is overall better aeration the lungs with interval improvement of the left lung base densities since the prior radiograph. Some residual density at the  left lung base may represent chronic changes or infiltrate. Minimal right lung base atelectatic changes noted. No lobar consolidation, pleural effusion, or pneumothorax. Stable cardiomegaly. Left pectoral pacemaker device. No acute osseous pathology. IMPRESSION: 1. No acute cardiopulmonary process. 2. Improved aeration of the left lung base since the prior radiograph and improvement in the left lung base density. Electronically Signed   By: Anner Crete M.D.   On: 06/24/2019 13:31    EKG: Independently reviewed.  Sinus/paced rhythm with prolonged QTC at 555 ms.     Assessment and Plan:   Principal Problem:   GI bleed Active Problems:   S/P lobectomy of lung   Non-small cell carcinoma of right lung, stage 1 (HCC)   CKD (chronic kidney disease), stage IV (HCC)   Chest pain   Nonischemic cardiomyopathy (HCC)   Dementia (HCC)   Pressure injury of skin   Hypotension   Electrolyte abnormality    1.  Upper GI bleed/acute blood loss anemia: Patient reportedly had coffee-ground emesis at the rehab facility and her hemoglobin checked today shows significant drop from 12->7.  2 units of PRBC transfusion ordered through ED and first unit initiated now.  Will keep n.p.o. (except meds) with IV fluids and continue Protonix drip that has been initiated in the ED.  Consulted GI and discussed with Dr. Fuller Plan who will evaluate patient in a.m. for EGD.  Patient had lower GI bleed in the last hospitalization in the setting of anticoagulation.  She has not been on anticoagulation at the rehab facility.  Will hold aspirin for now.  Avoid NSAIDs.  2.  Hypotension: Secondary to problem #1 in the setting of poor oral intake, low EF and antihypertensives.  Reportedly, her blood pressure dropped after receiving sublingual nitro x3 today.  Hold BiDil, metoprolol, Lasix.  Patient receiving 1.7 L of IV fluids in the ED.   Her systolic blood pressure currently in the 80s.  Will order low rate maintenance fluids as she  is mentating relatively well..She does have low EF, will need to watch for  signs of fluid overload.  Doubt sepsis without white count/fever/lactic acidosis or clear source of infection.  Awaiting UA.  Chest x-ray within normal limits.  Will continue cefepime for now, can discontinue if UA clear.  Received 1 dose of vancomycin in the ED.  Can consider Midodrin/albumin if hypotension persists and if concern for fluid overload  3.  Chest pain: In the setting of problem #1 and significant anemia/electrolyte abnormalities.  EKG shows sinus/paced rhythm.  She does have prolonged QTC and significant hypokalemia/hypocalcemia.  Patient did not receive 40 mEq of p.o. KCl yet per nurse, only received 10 mEq of IV.  Will order stat oral potassium dose.  Check magnesium/phosphorus and replace.  Also ordered ionized calcium, calcium chloride 1 g x 1.  Recheck BMP at 8 PM.  Will cycle cardiac enzymes and check echo for new wall motion abnormalities.  EKG/first set of troponin unremarkable so far.  4.  Recent hospitalization for COVID pneumonia: Currently saturating well on room air.  Chest x-ray no new findings.  Repeat COVID testing negative.  5.  Hypocalcemia: Patient has albumin level of 1.4 and corrected calcium around 6.7.  Will check ionized calcium level, calcium chloride 1 g IV ordered to be given after ionized calcium drawn.  Check PTH/vitamin D level.  6.  CKD stage IV: Creatinine appears to be stable and at baseline.  7.  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  Appears dry.  Watch for fluid overload in the setting of IV hydration.  Holding Lasix/metoprolol in concern for problem #2.  Holding aspirin and concern for problem #1.  Patient apparently had paroxysmal A. fib with RVR in prior hospitalization.  Watch for arrhythmias in the setting of significant electrolyte abnormalities.  8.  Prolonged QTC: Electrolyte repletion, EKG in a.m.   Check magnesium/phosphorus.  Ordered IV calcium chloride and magnesium.  9.  Non-small cell lung cancer: Status post lobectomy, chest x-ray findings could be related to prior surgery.  10.  COPD: No respiratory complaints at this time.  Resume MDI as needed  11. Dementia: Patient appears to have had progressive cognitive decline since prior hospitalization.  She appears to be oriented to person only and tends to confabulate.  Avoid antipsychotics in concern for problem #8.  Can resume Aricept when acute issues resolve.  12.  Sacral pressure injury stage I: Mild redness in the sacral/coccygeal area with Allevyn patch in place.  Continue local care.  DVT prophylaxis: SCDs  COVID screen: Negative  Code Status: DO NOT INTUBATE okay for CPR/other resuscitative measures.son also wants ICU transfer/pressors if need be.  Health care proxy would be son, Chrissie Noa  Patient/Family Communication: Discussed with patient's son and all questions answered to satisfaction.  Consults called: GI, discussed with Dr. Fuller Plan Admission status :I certify that at the point of admission it is my clinical judgment that the patient will require inpatient hospital care spanning beyond 2 midnights from the point of admission due to high intensity of service and high frequency of surveillance required.Inpatient status is judged to be reasonable and necessary in order to provide the required intensity of service to ensure the patient's safety. The patient's presenting symptoms, physical exam findings, and initial radiographic and laboratory data in the context of their chronic comorbidities is felt to place them at high risk for further clinical deterioration. The following factors support the patient status of inpatient : Upper GI bleed with a BLA requiring blood transfusion and  significant hypotension and electrolyte abnormalities requiring IV infusions.  Patient will be admitted to stepdown status given hemodynamic instability.  Expected LOS: 3 to 5 days.    Guilford Shi MD [facility] Pager [phone]  If 7PM-7AM, please contact night-coverage [email or web address] Password [email or web address]  06/24/2019, 5:30 PM

## 2019-06-24 NOTE — ED Notes (Signed)
Attempted report to [facility].

## 2019-06-25 ENCOUNTER — Inpatient Hospital Stay (HOSPITAL_COMMUNITY): Payer: Medicare Other

## 2019-06-25 DIAGNOSIS — R072 Precordial pain: Secondary | ICD-10-CM

## 2019-06-25 LAB — TYPE AND SCREEN
ABO/RH(D): A POS
Antibody Screen: NEGATIVE
Unit division: 0
Unit division: 0

## 2019-06-25 LAB — BASIC METABOLIC PANEL
Anion gap: 14 (ref 5–15)
BUN: 58 mg/dL — ABNORMAL HIGH (ref 8–23)
CO2: 19 mmol/L — ABNORMAL LOW (ref 22–32)
Calcium: 8.2 mg/dL — ABNORMAL LOW (ref 8.9–10.3)
Chloride: 106 mmol/L (ref 98–111)
Creatinine, Ser: 3.63 mg/dL — ABNORMAL HIGH (ref 0.44–1.00)
GFR calc Af Amer: 14 mL/min — ABNORMAL LOW (ref >60)
GFR calc non Af Amer: 12 mL/min — ABNORMAL LOW (ref >60)
Glucose, Bld: 63 mg/dL — ABNORMAL LOW (ref 70–99)
Potassium: 4.6 mmol/L (ref 3.5–5.1)
Sodium: 139 mmol/L (ref 135–145)

## 2019-06-25 LAB — CBC
HCT: 37.4 % (ref 36.0–46.0)
Hemoglobin: 13.3 g/dL (ref 12.0–15.0)
MCH: 31 pg (ref 26.0–34.0)
MCHC: 35.6 g/dL (ref 30.0–36.0)
MCV: 87.2 fL (ref 80.0–100.0)
Platelets: 325 10*3/uL (ref 150–400)
RBC: 4.29 MIL/uL (ref 3.87–5.11)
RDW: 16.2 % — ABNORMAL HIGH (ref 11.5–15.5)
WBC: 15.3 10*3/uL — ABNORMAL HIGH (ref 4.0–10.5)
nRBC: 0 % (ref 0.0–0.2)

## 2019-06-25 LAB — ECHOCARDIOGRAM LIMITED
Height: 64 in
Weight: 1992.96 oz

## 2019-06-25 LAB — BPAM RBC
Blood Product Expiration Date: 202008242359
Blood Product Expiration Date: 202008242359
ISSUE DATE / TIME: 202008191609
ISSUE DATE / TIME: 202008192239
Unit Type and Rh: 6200
Unit Type and Rh: 6200

## 2019-06-25 LAB — TROPONIN I (HIGH SENSITIVITY): Troponin I (High Sensitivity): 31 ng/L — ABNORMAL HIGH (ref <18)

## 2019-06-25 LAB — LACTIC ACID, PLASMA: Lactic Acid, Venous: 2 mmol/L (ref 0.5–1.9)

## 2019-06-25 LAB — MAGNESIUM: Magnesium: 2.2 mg/dL (ref 1.7–2.4)

## 2019-06-25 LAB — PHOSPHORUS: Phosphorus: 4.5 mg/dL (ref 2.5–4.6)

## 2019-06-25 MED ORDER — ORAL CARE MOUTH RINSE
15.0000 mL | Freq: Two times a day (BID) | OROMUCOSAL | Status: DC
  Administered 2019-06-27 – 2019-07-02 (×9): 15 mL via OROMUCOSAL

## 2019-06-25 MED ORDER — DONEPEZIL HCL 5 MG PO TABS
5.0000 mg | ORAL_TABLET | Freq: Every day | ORAL | Status: DC
  Administered 2019-06-26 – 2019-07-02 (×7): 5 mg via ORAL
  Filled 2019-06-25 (×7): qty 1

## 2019-06-25 MED ORDER — SODIUM CHLORIDE 0.9 % IV SOLN
INTRAVENOUS | Status: DC
  Administered 2019-06-25: 20:00:00 via INTRAVENOUS
  Administered 2019-06-26: 1000 mL via INTRAVENOUS

## 2019-06-25 MED ORDER — SODIUM CHLORIDE 0.9 % IV SOLN
INTRAVENOUS | Status: DC
  Administered 2019-06-25: 08:00:00 via INTRAVENOUS

## 2019-06-25 MED ORDER — CHLORHEXIDINE GLUCONATE 0.12 % MT SOLN
15.0000 mL | Freq: Two times a day (BID) | OROMUCOSAL | Status: DC
  Administered 2019-06-26 – 2019-07-03 (×13): 15 mL via OROMUCOSAL
  Filled 2019-06-25 (×14): qty 15

## 2019-06-25 MED ORDER — SODIUM CHLORIDE 0.9 % IV SOLN: INTRAVENOUS | Status: AC

## 2019-06-25 NOTE — Plan of Care (Signed)

## 2019-06-25 NOTE — Consult Note (Signed)
Referring Provider: Dr. Broadus John Primary Care Physician:  Josetta Huddle, MD Primary Gastroenterologist:  Althia Forts  Reason for Consultation:  Coffee grounds emesis  HPI: Betty [name] is a 73 y.o. female with multiple medical problems sent from her rehab center due to coffee grounds emesis. No report of melena or hematochezia. Has not had any coffee grounds emesis since admission. She is demented and not able to give any history. She is not oriented to person, place, or time. She was discharged to a SNF in early August after admission for COVID-19 and has been negative X 2 since that discharge. Hgb 7 (12.3 on 06/10/19). S/P 2 U PRBCs and Hgb today of 13.3.  Past Medical History:  Diagnosis Date  . Acute combined systolic and diastolic (congestive) hrt fail ([place])   . Acute mastitis of right breast 10/2003  . Arthritis    bursitis in right shoulder (05/21/2018)  . 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 CKD (chronic kidney disease), stage IV ([place])   . Colon polyps   . 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 Dementia ([place]) 07/16/2018  . Family history of breast cancer   . GERD (gastroesophageal reflux disease)   . Hypercholesterolemia   . Hypertension   . Non-small cell carcinoma of right lung, stage 1 ([place]) 05/17/2015   Stage IB, right upper lobectomy 04/29/15, chemo   . NSTEMI (non-ST elevated myocardial infarction) ([place]) 03/24/2018  . Presence of permanent cardiac pacemaker 05/21/2018  . Pure hypercholesterolemia   . Sciatica of right side   . Seasonal allergic rhinitis   . Tobacco dependence   . Tubular adenoma of colon   . Vitamin D deficiency     Past Surgical History:  Procedure Laterality Date  . BIV PACEMAKER INSERTION CRT-P N/A 05/21/2018   Procedure: BIV PACEMAKER INSERTION CRT-P;  Surgeon: Deboraha Sprang, MD;  Location: [facility];  Service: Cardiovascular;  Laterality: N/A;  . BREAST SURGERY      small mass removed from left breast--benign  . CRYO INTERCOSTAL NERVE BLOCK Right 04/29/2015   Procedure: CRYO INTERCOSTAL NERVE BLOCK;  Surgeon: [name], MD;  Location: [facility];  Service: Thoracic;  Laterality: Right;  . INSERT / REPLACE / REMOVE PACEMAKER  05/21/2018  . LOBECTOMY Right 04/29/2015   Procedure: RIGHT LOWER LUNG LOBECTOMY ;  Surgeon: [name], MD;  Location: [facility];  Service: Thoracic;  Laterality: Right;  . LYMPH NODE DISSECTION Right 04/29/2015   Procedure: RIGHT LUNG LYMPH NODE DISSECTION;  Surgeon: [name], MD;  Location: [facility];  Service: Thoracic;  Laterality: Right;  . RIGHT/LEFT HEART CATH AND CORONARY ANGIOGRAPHY N/A 04/24/2018   Procedure: RIGHT/LEFT HEART CATH AND CORONARY ANGIOGRAPHY;  Surgeon: [name], MD;  Location: [facility];  Service: Cardiovascular;  Laterality: N/A;  . VAGINAL DELIVERY     51 yrs ago  . VIDEO ASSISTED THORACOSCOPY (VATS)/ LOBECTOMY Right 04/29/2015   Procedure: RIGHT VIDEO ASSISTED THORACOSCOPY (VATS) WEDGE RESECTION/ RIGHT LOWER LOBECTOMY, CRYO-ANALGESIA OF INTERCOSTAL NERVES;  Surgeon: [name], MD;  Location: [facility];  Service: Thoracic;  Laterality: Right;    Prior to Admission medications   Medication Sig Start Date End Date Taking? Authorizing Provider  acetaminophen (TYLENOL) 325 MG tablet Take 650 mg by mouth every 6 (six) hours as needed for mild pain or moderate pain.   Yes [provider]  acetaminophen (TYLENOL) 500 MG tablet Take  1,000 mg by mouth every 8 (eight) hours.    Yes [provider]  albuterol (VENTOLIN HFA) 108 (90 Base) MCG/ACT inhaler Inhale 2 puffs into the lungs every 6 (six) hours as needed for wheezing or shortness of breath.   Yes [provider]  aspirin EC 81 MG tablet Take 81 mg by mouth at bedtime.   Yes [provider]  atorvastatin (LIPITOR) 40 MG tablet TAKE 1 TABLET BY MOUTH ONCE DAILY AT 6 PM Patient taking  differently: Take 40 mg by mouth daily at 6 PM.  03/27/19  Yes Belva Crome, MD  Cholecalciferol (VITAMIN D) 2000 units tablet Take 2,000 Units by mouth at bedtime.   Yes [provider]  donepezil (ARICEPT) 10 MG tablet Take 1 tablet (10 mg total) by mouth at bedtime. 03/23/19  Yes Suzzanne Cloud, NP  febuxostat (ULORIC) 40 MG tablet Take 40 mg by mouth at bedtime.    Yes [provider]  furosemide (LASIX) 40 MG tablet Take 20 mg by mouth daily.    Yes [provider]  hydrOXYzine (ATARAX/VISTARIL) 10 MG tablet Take 10 mg by mouth 2 (two) times daily. Anxiety   Yes [provider]  isosorbide-hydrALAZINE (BIDIL) 20-37.5 MG tablet Take 1 tablet by mouth 2 (two) times daily. 08/26/18  Yes Deboraha Sprang, MD  metoprolol succinate (TOPROL-XL) 100 MG 24 hr tablet Take 1 tablet (100 mg total) by mouth daily. Pt must keep upcoming appt in August for further refills. Thanks 04/07/19  Yes Belva Crome, MD  Multiple Vitamin (MULTIVITAMIN WITH MINERALS) TABS tablet Take 0.5 tablets by mouth at bedtime.   Yes [provider]  nitroGLYCERIN (NITROSTAT) 0.4 MG SL tablet Place 1 tablet (0.4 mg total) under the tongue every 5 (five) minutes x 3 doses as needed for chest pain. 04/26/18  Yes Daune Perch, NP    Scheduled Meds: . atorvastatin  40 mg Oral q1800  . potassium chloride  40 mEq Oral Once   Continuous Infusions: . sodium chloride 75 mL/hr at 06/25/19 0821  . calcium chloride    . magnesium sulfate bolus IVPB    . pantoprozole (PROTONIX) infusion 8 mg/hr (06/25/19 0624)  . potassium chloride     PRN Meds:.acetaminophen, albuterol, HYDROcodone-acetaminophen, ondansetron **OR** ondansetron (ZOFRAN) IV  Allergies as of 06/24/2019 - Review Complete 06/24/2019  Allergen Reaction Noted  . Sulfa antibiotics Itching 04/14/2015    Family History  Problem Relation Age of Onset  . Cancer Father   . Cancer Sister   . Heart attack Brother   . Dementia  Brother   . Other Son        bicuspid aortic valve  . Heart attack Brother   . Cancer Sister     Social History   Socioeconomic History  . Marital status: Single    Spouse name: Not on file  . Number of children: 1  . Years of education: 42  . Highest education level: Not on file  Occupational History  . Occupation: Retired  Scientific laboratory technician  . Financial resource strain: Not hard at all  . Food insecurity    Worry: Patient refused    Inability: Patient refused  . Transportation needs    Medical: Patient refused    Non-medical: Patient refused  Tobacco Use  . Smoking status: Former Smoker    Packs/day: 0.75    Years: 47.00    Pack years: 35.25    Types: Cigarettes    Quit date: 04/29/2015  Years since quitting: 4.1  . Smokeless tobacco: Never Used  Substance and Sexual Activity  . Alcohol use: Yes    Comment: 05/21/2018 "maybe 6 drinks/year"  . Drug use: Never  . Sexual activity: Not on file  Lifestyle  . Physical activity    Days per week: Patient refused    Minutes per session: Patient refused  . Stress: Only a little  Relationships  . Social Herbalist on phone: Patient refused    Gets together: Patient refused    Attends religious service: Patient refused    Active member of club or organization: Patient refused    Attends meetings of clubs or organizations: Patient refused    Relationship status: Patient refused  . Intimate partner violence    Fear of current or ex partner: Patient refused    Emotionally abused: Patient refused    Physically abused: Patient refused    Forced sexual activity: Patient refused  Other Topics Concern  . Not on file  Social History Narrative   Lives alone   Caffeine use: 1 cup coffee per day   Right handed     Review of Systems: All negative except as stated above in HPI.  Physical Exam: Vital signs: Vitals:   06/25/19 0208 06/25/19 0300  BP: (!) 87/59   Pulse: 81   Resp: 17 17  Temp: 97.6 F (36.4 C)    SpO2: 95%      General:  demented, elderly, thin, no acute distress  Head: normocephalic, atraumatic Eyes: anicteric sclera ENT: oropharynx clear Neck: supple, nontender Lungs:  Clear throughout to auscultation.   No wheezes, crackles, or rhonchi. No acute distress. Heart:  Regular rate and rhythm; no murmurs, clicks, rubs,  or gallops. Abdomen: soft, nontender, nondistended, +BS  Rectal:  Deferred Ext: no edema  GI:  Lab Results: Recent Labs    06/24/19 1348 06/25/19 0853  WBC 9.2 15.3*  HGB 7.0* 13.3  HCT 21.4* 37.4  PLT 270 325   BMET Recent Labs    06/24/19 1348 06/25/19 0531  NA 143 139  K 2.1* 4.6  CL 125* 106  CO2 13* 19*  GLUCOSE 63* 63*  BUN 45* 58*  CREATININE 2.43* 3.63*  CALCIUM 4.6* 8.2*   LFT Recent Labs    06/24/19 1348  PROT 3.1*  ALBUMIN 1.4*  AST 14*  ALT 9  ALKPHOS 44  BILITOT 0.7   PT/INR Recent Labs    06/24/19 1350  LABPROT 17.7*  INR 1.5*     Studies/Results: Dg Chest Port 1 View  Result Date: 06/24/2019 CLINICAL DATA:  73 year old female with chest pain. EXAM: PORTABLE CHEST 1 VIEW COMPARISON:  Chest radiograph dated 05/28/2019 FINDINGS: There is overall better aeration the lungs with interval improvement of the left lung base densities since the prior radiograph. Some residual density at the left lung base may represent chronic changes or infiltrate. Minimal right lung base atelectatic changes noted. No lobar consolidation, pleural effusion, or pneumothorax. Stable cardiomegaly. Left pectoral pacemaker device. No acute osseous pathology. IMPRESSION: 1. No acute cardiopulmonary process. 2. Improved aeration of the left lung base since the prior radiograph and improvement in the left lung base density. Electronically Signed   By: Anner Crete M.D.   On: 06/24/2019 13:31    Impression/Plan: Coffee grounds emesis - Hgb 7 with increase to 13.3 following 2 U PRBCs. No overt bleeding since admission. Continue Protonix drip.  Keep NPO. EGD tomorrow to further evaluate for peptic ulcer disease.  Risks/benefits discussed with son and he has given verbal consent by phone. If EGD is unrevealing, would manage conservatively.     LOS: 1 day   Lear Ng  06/25/2019, 10:47 AM  Questions please call [phone]

## 2019-06-25 NOTE — Progress Notes (Signed)
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 fib, lower GI bleed, discharged on baby aspirin and metoprolol, dysphagia 3 diet. -Presented to the ED yesterday evening from [facility] rehab with complaints of chest pain, coffee-ground emesis, of note patient has severe dementia and is unable to provide any reliable history, her blood pressure was low in the 80s, hemoglobin was found to be 7 yesterday down from 12  A few weeks ago, Hemoccult was positive and she was admitted to stepdown unit. -Overnight, given 2 units of PRBC  Assessment & Plan:   Coffee-ground emesis -Per report, history of coffee-ground emesis at rehab yesterday with acute blood loss anemia -Hemoglobin was 7.0 yesterday evening in the ER, status post 2 units of PRBC, it is up to 13 this morning, suspect the initial hemoglobin was an error [facility] gastroenterology consulting, plan for endoscopy tomorrow -Aspirin on hold  Hypotension -Multifactorial, poor oral intake, low-grade GI bleed, low EF, multiple antihypertensives -Continue gentle IV fluids today -Hold BiDil, Lasix, metoprolol -Resume metoprolol when blood pressure tolerates  ?  Chest pain on admission -Suspect this was secondary to coffee-ground emesis, patient denies any chest pain this morning and does not recall reporting this yesterday -High-sensitivity troponin is low  Recent hospitalization for COVID pneumonia -Status post VDRF -Improved from the standpoint,  repeat COVID testing is negative  AKI on CKD 4 -Creatinine 2.6 at baseline, now 3.6 -Suspect this is cardiorenal and worsened by hypotension -Continue gentle IV fluids today, diuretics on hold -Followed by [facility] would be a poor dialysis candidate due to advanced dementia and multiple other comorbidities   CAD, chronic combined CHF with EF 30-35%, status post biventricular pacemaker in 2019 (Medtronic MRI compatible).   -Holding Lasix/metoprolol with AKI, hypotension -Hold aspirin -Watch for rapid A. fib   COPD -Stable, duo nebs as needed  Dementia -Ongoing cognitive decline this year, worsened reportedly since recent hospitalization at [facility] for approximately 1 month with COVID -Resume Aricept  Stage I sacral pressure injury -Supportive care   DVT prophylaxis: SCDs Code Status:  DO NOT INTUBATE okay for CPR/other resuscitative measures.son also wants ICU transfer/pressors if need be.  Health care proxy would be son, Betty [name] Family Communication: Family at bedside will update son Disposition Plan: Will likely need SNF again  Consultants:   [facility]   Procedures:   Antimicrobials:    Subjective: Remains pleasantly confused, no events overnight, blood pressure remains low, no overt bleeding noted by staff  Objective: Vitals:   06/24/19 2235 06/24/19 2301 06/25/19 0208 06/25/19 0300  BP: (!) 110/56 (!) 108/45 (!) 87/59   Pulse: 81 83 81   Resp: 15 18 17 17   Temp: 97.6 F (36.4 C) 97.9 F (36.6 C) 97.6 F (36.4 C)   TempSrc: Axillary Axillary Axillary   SpO2: 97% 98% 95%   Weight:      Height:        Intake/Output Summary (Last 24 hours) at 06/25/2019 1052 Last data filed at 06/25/2019 0624 Gross per 24 hour  Intake 3767.07 ml  Output -  Net 3767.07 ml   Filed Weights   06/24/19 2130  Weight: 56.5 kg    Examination:  General exam: Elderly frail female, pleasantly confused Respiratory system: Poor air movement  bilaterally, otherwise clear Cardiovascular system: S1-S2/regular rate rhythm  gastrointestinal system: Abdomen is nondistended, soft and nontender.Normal bowel sounds heard. Central nervous system: Alert and awake, pleasantly confused, moves all extremities  extremities: No edema Skin: No rashes, lesions or ulcers Psychiatry: Poor insight and judgment     Data Reviewed:   CBC: Recent Labs  Lab 06/24/19 1348 06/25/19 0853  WBC 9.2 15.3*  NEUTROABS 7.5  --   HGB 7.0* 13.3  HCT 21.4* 37.4  MCV 97.7 87.2  PLT 270 253   Basic Metabolic Panel: Recent Labs  Lab 06/24/19 1348 06/25/19 0531  NA 143 139  K 2.1* 4.6  CL 125* 106  CO2 13* 19*  GLUCOSE 63* 63*  BUN 45* 58*  CREATININE 2.43* 3.63*  CALCIUM 4.6* 8.2*  MG  --  2.2  PHOS  --  4.5   GFR: Estimated Creatinine Clearance: 11.7 mL/min (A) (by C-G formula based on SCr of 3.63 mg/dL (H)). Liver Function Tests: Recent Labs  Lab 06/24/19 1348  AST 14*  ALT 9  ALKPHOS 44  BILITOT 0.7  PROT 3.1*  ALBUMIN 1.4*   Recent Labs  Lab 06/24/19 1348  LIPASE 44   No results for input(s): AMMONIA in the last 168 hours. Coagulation Profile: Recent Labs  Lab 06/24/19 1350  INR 1.5*   Cardiac Enzymes: No results for input(s): CKTOTAL, CKMB, CKMBINDEX, TROPONINI in the last 168 hours. BNP (last 3 results) No results for input(s): PROBNP in the last 8760 hours. HbA1C: No results for input(s): HGBA1C in the last 72 hours. CBG: Recent Labs  Lab 06/24/19 1257  GLUCAP 104*   Lipid Profile: No results for input(s): CHOL, HDL, LDLCALC, TRIG, CHOLHDL, LDLDIRECT in the last 72 hours. Thyroid Function Tests: No results for input(s): TSH, T4TOTAL, FREET4, T3FREE, THYROIDAB in the last 72 hours. Anemia Panel: No results for input(s): VITAMINB12, FOLATE, FERRITIN, TIBC, IRON, RETICCTPCT in the last 72 hours. Urine analysis:    Component Value Date/Time   COLORURINE YELLOW 05/21/2019 1541   APPEARANCEUR CLEAR  05/21/2019 1541   LABSPEC 1.012 05/21/2019 1541   PHURINE 5.0 05/21/2019 1541   GLUCOSEU NEGATIVE 05/21/2019 1541   HGBUR MODERATE (A) 05/21/2019 1541   BILIRUBINUR NEGATIVE 05/21/2019 1541   KETONESUR NEGATIVE 05/21/2019 1541   PROTEINUR NEGATIVE 05/21/2019 1541   UROBILINOGEN 0.2 04/27/2015 0834   NITRITE NEGATIVE 05/21/2019 1541   LEUKOCYTESUR NEGATIVE 05/21/2019 1541   Sepsis Labs: @LABRCNTIP (procalcitonin:4,lacticidven:4)  ) Recent Results (from the past 240 hour(s))  Culture, blood (routine x 2)     Status: None (Preliminary result)   Collection Time: 06/24/19  1:39 PM   Specimen: BLOOD RIGHT ARM  Result Value Ref Range Status   Specimen Description BLOOD RIGHT ARM  Final   Special Requests   Final    BOTTLES DRAWN AEROBIC AND ANAEROBIC Blood Culture adequate volume   Culture   Final    NO GROWTH < 24 HOURS Performed at [facility], [place] [address] [place], [place] [address]    Report Status PENDING  Incomplete  SARS Coronavirus 2 [facility] order, Performed in [facility] hospital lab) Nasopharyngeal Nasopharyngeal Swab     Status: None   Collection Time: 06/24/19  1:42 PM   Specimen: Nasopharyngeal Swab  Result Value Ref Range Status  SARS Coronavirus 2 NEGATIVE NEGATIVE Final    Comment: (NOTE) If result is NEGATIVE SARS-CoV-2 target nucleic acids are NOT DETECTED. The SARS-CoV-2 RNA is generally detectable in upper and lower  respiratory specimens during the acute phase of infection. The lowest  concentration of SARS-CoV-2 viral copies this assay can detect is 250  copies / mL. A negative result does not preclude SARS-CoV-2 infection  and should not be used as the sole basis for treatment or other  patient management decisions.  A negative result may occur with  improper specimen collection / handling, submission of specimen other  than nasopharyngeal swab, presence of viral mutation(s) within the  areas targeted by this assay, and inadequate number of  viral copies  (<250 copies / mL). A negative result must be combined with clinical  observations, patient history, and epidemiological information. If result is POSITIVE SARS-CoV-2 target nucleic acids are DETECTED. The SARS-CoV-2 RNA is generally detectable in upper and lower  respiratory specimens dur ing the acute phase of infection.  Positive  results are indicative of active infection with SARS-CoV-2.  Clinical  correlation with patient history and other diagnostic information is  necessary to determine patient infection status.  Positive results do  not rule out bacterial infection or co-infection with other viruses. If result is PRESUMPTIVE POSTIVE SARS-CoV-2 nucleic acids MAY BE PRESENT.   A presumptive positive result was obtained on the submitted specimen  and confirmed on repeat testing.  While 2019 novel coronavirus  (SARS-CoV-2) nucleic acids may be present in the submitted sample  additional confirmatory testing may be necessary for epidemiological  and / or clinical management purposes  to differentiate between  SARS-CoV-2 and other Sarbecovirus currently known to infect humans.  If clinically indicated additional testing with an alternate test  methodology [phone]) is advised. The SARS-CoV-2 RNA is generally  detectable in upper and lower respiratory sp ecimens during the acute  phase of infection. The expected result is Negative. Fact Sheet for Patients:  StrictlyIdeas.no Fact Sheet for Healthcare Providers: BankingDealers.co.za This test is not yet approved or cleared by the [place] FDA and has been authorized for detection and/or diagnosis of SARS-CoV-2 by FDA under an Emergency Use Authorization (EUA).  This EUA will remain in effect (meaning this test can be used) for the duration of the COVID-19 declaration under Section 564(b)(1) of the Act, 21 U.S.C. section 360bbb-3(b)(1), unless the authorization is  terminated or revoked sooner. Performed at [facility], [place] [address], [place], [place] [address]          Radiology Studies: Dg Chest [place] 1 View  Result Date: 06/24/2019 CLINICAL DATA:  73 year old female with chest pain. EXAM: PORTABLE CHEST 1 VIEW COMPARISON:  Chest radiograph dated 05/28/2019 FINDINGS: There is overall better aeration the lungs with interval improvement of the left lung base densities since the prior radiograph. Some residual density at the left lung base may represent chronic changes or infiltrate. Minimal right lung base atelectatic changes noted. No lobar consolidation, pleural effusion, or pneumothorax. Stable cardiomegaly. Left pectoral pacemaker device. No acute osseous pathology. IMPRESSION: 1. No acute cardiopulmonary process. 2. Improved aeration of the left lung base since the prior radiograph and improvement in the left lung base density. Electronically Signed   By: Anner Crete M.D.   On: 06/24/2019 13:31        Scheduled Meds: . atorvastatin  40 mg Oral q1800  . potassium chloride  40 mEq Oral Once   Continuous Infusions: . sodium  chloride 75 mL/hr at 06/25/19 0821  . calcium chloride    . magnesium sulfate bolus IVPB    . pantoprozole (PROTONIX) infusion 8 mg/hr (06/25/19 0624)  . potassium chloride       LOS: 1 day    Time spent: 70min   Domenic Polite, MD [facility] Page via [email or web address], password [email or web address] After 7PM please contact night-coverage  06/25/2019, 10:52 AM

## 2019-06-25 NOTE — Plan of Care (Signed)
  Problem: Education: Goal: Knowledge of General Education information will improve Description: Including pain rating scale, medication(s)/side effects and non-pharmacologic comfort measures Outcome: Not Progressing   Problem: Health Behavior/Discharge Planning: Goal: Ability to manage health-related needs will improve Outcome: Not Progressing   Problem: Clinical Measurements: Goal: Ability to maintain clinical measurements within normal limits will improve Outcome: Not Progressing Goal: Will remain free from infection Outcome: Not Progressing Goal: Diagnostic test results will improve Outcome: Not Progressing Goal: Respiratory complications will improve Outcome: Not Progressing Goal: Cardiovascular complication will be avoided Outcome: Not Progressing   Problem: Activity: Goal: Risk for activity intolerance will decrease Outcome: Not Progressing   Problem: Nutrition: Goal: Adequate nutrition will be maintained Outcome: Not Progressing   Problem: Coping: Goal: Level of anxiety will decrease Outcome: Not Progressing   Problem: Elimination: Goal: Will not experience complications related to bowel motility Outcome: Not Progressing Goal: Will not experience complications related to urinary retention Outcome: Not Progressing   Problem: Pain Managment: Goal: General experience of comfort will improve Outcome: Not Progressing   Problem: Safety: Goal: Ability to remain free from injury will improve Outcome: Not Progressing   Problem: Skin Integrity: Goal: Risk for impaired skin integrity will decrease Outcome: Not Progressing  Pt has dementia, unable to comprehend.

## 2019-06-25 NOTE — H&P (View-Only) (Signed)
Referring Provider: Dr. Broadus John Primary Care Physician:  Josetta Huddle, MD Primary Gastroenterologist:  Althia Forts  Reason for Consultation:  Coffee grounds emesis  HPI: Betty [name] is a 73 y.o. female with multiple medical problems sent from her rehab center due to coffee grounds emesis. No report of melena or hematochezia. Has not had any coffee grounds emesis since admission. She is demented and not able to give any history. She is not oriented to person, place, or time. She was discharged to a SNF in early August after admission for COVID-19 and has been negative X 2 since that discharge. Hgb 7 (12.3 on 06/10/19). S/P 2 U PRBCs and Hgb today of 13.3.  Past Medical History:  Diagnosis Date  . Acute combined systolic and diastolic (congestive) hrt fail ([place])   . Acute mastitis of right breast 10/2003  . Arthritis    bursitis in right shoulder (05/21/2018)  . 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 CKD (chronic kidney disease), stage IV ([place])   . Colon polyps   . 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 Dementia ([place]) 07/16/2018  . Family history of breast cancer   . GERD (gastroesophageal reflux disease)   . Hypercholesterolemia   . Hypertension   . Non-small cell carcinoma of right lung, stage 1 ([place]) 05/17/2015   Stage IB, right upper lobectomy 04/29/15, chemo   . NSTEMI (non-ST elevated myocardial infarction) ([place]) 03/24/2018  . Presence of permanent cardiac pacemaker 05/21/2018  . Pure hypercholesterolemia   . Sciatica of right side   . Seasonal allergic rhinitis   . Tobacco dependence   . Tubular adenoma of colon   . Vitamin D deficiency     Past Surgical History:  Procedure Laterality Date  . BIV PACEMAKER INSERTION CRT-P N/A 05/21/2018   Procedure: BIV PACEMAKER INSERTION CRT-P;  Surgeon: Deboraha Sprang, MD;  Location: [place] [facility];  Service: Cardiovascular;  Laterality: N/A;  . BREAST SURGERY      small mass removed from left breast--benign  . CRYO INTERCOSTAL NERVE BLOCK Right 04/29/2015   Procedure: CRYO INTERCOSTAL NERVE BLOCK;  Surgeon: [name], MD;  Location: [place];  Service: Thoracic;  Laterality: Right;  . INSERT / REPLACE / REMOVE PACEMAKER  05/21/2018  . LOBECTOMY Right 04/29/2015   Procedure: RIGHT LOWER LUNG LOBECTOMY ;  Surgeon: [name], MD;  Location: [place];  Service: Thoracic;  Laterality: Right;  . LYMPH NODE DISSECTION Right 04/29/2015   Procedure: RIGHT LUNG LYMPH NODE DISSECTION;  Surgeon: [name], MD;  Location: [place];  Service: Thoracic;  Laterality: Right;  . RIGHT/LEFT HEART CATH AND CORONARY ANGIOGRAPHY N/A 04/24/2018   Procedure: RIGHT/LEFT HEART CATH AND CORONARY ANGIOGRAPHY;  Surgeon: [name], MD;  Location: [place] [facility];  Service: Cardiovascular;  Laterality: N/A;  . VAGINAL DELIVERY     51 yrs ago  . VIDEO ASSISTED THORACOSCOPY (VATS)/ LOBECTOMY Right 04/29/2015   Procedure: RIGHT VIDEO ASSISTED THORACOSCOPY (VATS) WEDGE RESECTION/ RIGHT LOWER LOBECTOMY, CRYO-ANALGESIA OF INTERCOSTAL NERVES;  Surgeon: [name], MD;  Location: [facility];  Service: Thoracic;  Laterality: Right;    Prior to Admission medications   Medication Sig Start Date End Date Taking? Authorizing Provider  acetaminophen (TYLENOL) 325 MG tablet Take 650 mg by mouth every 6 (six) hours as needed for mild pain or moderate pain.   Yes [provider]  acetaminophen (TYLENOL) 500 MG tablet Take  1,000 mg by mouth every 8 (eight) hours.    Yes [provider]  albuterol (VENTOLIN HFA) 108 (90 Base) MCG/ACT inhaler Inhale 2 puffs into the lungs every 6 (six) hours as needed for wheezing or shortness of breath.   Yes [provider]  aspirin EC 81 MG tablet Take 81 mg by mouth at bedtime.   Yes [provider]  atorvastatin (LIPITOR) 40 MG tablet TAKE 1 TABLET BY MOUTH ONCE DAILY AT 6 PM Patient taking  differently: Take 40 mg by mouth daily at 6 PM.  03/27/19  Yes Belva Crome, MD  Cholecalciferol (VITAMIN D) 2000 units tablet Take 2,000 Units by mouth at bedtime.   Yes [provider]  donepezil (ARICEPT) 10 MG tablet Take 1 tablet (10 mg total) by mouth at bedtime. 03/23/19  Yes Suzzanne Cloud, NP  febuxostat (ULORIC) 40 MG tablet Take 40 mg by mouth at bedtime.    Yes [provider]  furosemide (LASIX) 40 MG tablet Take 20 mg by mouth daily.    Yes [provider]  hydrOXYzine (ATARAX/VISTARIL) 10 MG tablet Take 10 mg by mouth 2 (two) times daily. Anxiety   Yes [provider]  isosorbide-hydrALAZINE (BIDIL) 20-37.5 MG tablet Take 1 tablet by mouth 2 (two) times daily. 08/26/18  Yes Deboraha Sprang, MD  metoprolol succinate (TOPROL-XL) 100 MG 24 hr tablet Take 1 tablet (100 mg total) by mouth daily. Pt must keep upcoming appt in August for further refills. Thanks 04/07/19  Yes Belva Crome, MD  Multiple Vitamin (MULTIVITAMIN WITH MINERALS) TABS tablet Take 0.5 tablets by mouth at bedtime.   Yes [provider]  nitroGLYCERIN (NITROSTAT) 0.4 MG SL tablet Place 1 tablet (0.4 mg total) under the tongue every 5 (five) minutes x 3 doses as needed for chest pain. 04/26/18  Yes Daune Perch, NP    Scheduled Meds: . atorvastatin  40 mg Oral q1800  . potassium chloride  40 mEq Oral Once   Continuous Infusions: . sodium chloride 75 mL/hr at 06/25/19 0821  . calcium chloride    . magnesium sulfate bolus IVPB    . pantoprozole (PROTONIX) infusion 8 mg/hr (06/25/19 0624)  . potassium chloride     PRN Meds:.acetaminophen, albuterol, HYDROcodone-acetaminophen, ondansetron **OR** ondansetron (ZOFRAN) IV  Allergies as of 06/24/2019 - Review Complete 06/24/2019  Allergen Reaction Noted  . Sulfa antibiotics Itching 04/14/2015    Family History  Problem Relation Age of Onset  . Cancer Father   . Cancer Sister   . Heart attack Brother   . Dementia  Brother   . Other Son        bicuspid aortic valve  . Heart attack Brother   . Cancer Sister     Social History   Socioeconomic History  . Marital status: Single    Spouse name: Not on file  . Number of children: 1  . Years of education: 73  . Highest education level: Not on file  Occupational History  . Occupation: Retired  Scientific laboratory technician  . Financial resource strain: Not hard at all  . Food insecurity    Worry: Patient refused    Inability: Patient refused  . Transportation needs    Medical: Patient refused    Non-medical: Patient refused  Tobacco Use  . Smoking status: Former Smoker    Packs/day: 0.75    Years: 47.00    Pack years: 35.25    Types: Cigarettes    Quit date: 04/29/2015  Years since quitting: 4.1  . Smokeless tobacco: Never Used  Substance and Sexual Activity  . Alcohol use: Yes    Comment: 05/21/2018 "maybe 6 drinks/year"  . Drug use: Never  . Sexual activity: Not on file  Lifestyle  . Physical activity    Days per week: Patient refused    Minutes per session: Patient refused  . Stress: Only a little  Relationships  . Social Herbalist on phone: Patient refused    Gets together: Patient refused    Attends religious service: Patient refused    Active member of club or organization: Patient refused    Attends meetings of clubs or organizations: Patient refused    Relationship status: Patient refused  . Intimate partner violence    Fear of current or ex partner: Patient refused    Emotionally abused: Patient refused    Physically abused: Patient refused    Forced sexual activity: Patient refused  Other Topics Concern  . Not on file  Social History Narrative   Lives alone   Caffeine use: 1 cup coffee per day   Right handed     Review of Systems: All negative except as stated above in HPI.  Physical Exam: Vital signs: Vitals:   06/25/19 0208 06/25/19 0300  BP: (!) 87/59   Pulse: 81   Resp: 17 17  Temp: 97.6 F (36.4 C)    SpO2: 95%      General:  demented, elderly, thin, no acute distress  Head: normocephalic, atraumatic Eyes: anicteric sclera ENT: oropharynx clear Neck: supple, nontender Lungs:  Clear throughout to auscultation.   No wheezes, crackles, or rhonchi. No acute distress. Heart:  Regular rate and rhythm; no murmurs, clicks, rubs,  or gallops. Abdomen: soft, nontender, nondistended, +BS  Rectal:  Deferred Ext: no edema  GI:  Lab Results: Recent Labs    06/24/19 1348 06/25/19 0853  WBC 9.2 15.3*  HGB 7.0* 13.3  HCT 21.4* 37.4  PLT 270 325   BMET Recent Labs    06/24/19 1348 06/25/19 0531  NA 143 139  K 2.1* 4.6  CL 125* 106  CO2 13* 19*  GLUCOSE 63* 63*  BUN 45* 58*  CREATININE 2.43* 3.63*  CALCIUM 4.6* 8.2*   LFT Recent Labs    06/24/19 1348  PROT 3.1*  ALBUMIN 1.4*  AST 14*  ALT 9  ALKPHOS 44  BILITOT 0.7   PT/INR Recent Labs    06/24/19 1350  LABPROT 17.7*  INR 1.5*     Studies/Results: Dg Chest Port 1 View  Result Date: 06/24/2019 CLINICAL DATA:  73 year old female with chest pain. EXAM: PORTABLE CHEST 1 VIEW COMPARISON:  Chest radiograph dated 05/28/2019 FINDINGS: There is overall better aeration the lungs with interval improvement of the left lung base densities since the prior radiograph. Some residual density at the left lung base may represent chronic changes or infiltrate. Minimal right lung base atelectatic changes noted. No lobar consolidation, pleural effusion, or pneumothorax. Stable cardiomegaly. Left pectoral pacemaker device. No acute osseous pathology. IMPRESSION: 1. No acute cardiopulmonary process. 2. Improved aeration of the left lung base since the prior radiograph and improvement in the left lung base density. Electronically Signed   By: Anner Crete M.D.   On: 06/24/2019 13:31    Impression/Plan: Coffee grounds emesis - Hgb 7 with increase to 13.3 following 2 U PRBCs. No overt bleeding since admission. Continue Protonix drip.  Keep NPO. EGD tomorrow to further evaluate for peptic ulcer disease.  Risks/benefits discussed with son and he has given verbal consent by phone. If EGD is unrevealing, would manage conservatively.     LOS: 1 day   Lear Ng  06/25/2019, 10:47 AM  Questions please call [phone]

## 2019-06-25 NOTE — Consult Note (Signed)
[place] Nurse wound consult note Patient receiving care in [facility] 2C04.  I have discussed the consult and the area with primary RN, Archie Patten. Reason for Consult: "sacral pressure injury" Wound type: There is an area 5 cm x 5 cm along the upper medial gluteal fold that has scattered areas of hypopigmentation, but no wound.  There may have been an area of impairment to the site at some point, but there is not an actual wound now. The area does not need any treatment of dressing at this point in time, only continued monitoring. Thank you for the consult.  Discussed plan of care with the patient and bedside nurse.  [place] nurse will not follow at this time.  Please re-consult the [place] team if needed.  Val Riles, RN, MSN, CWOCN, CNS-BC, pager [phone]

## 2019-06-25 NOTE — Progress Notes (Signed)
CRITICAL VALUE ALERT  Critical Value: lactic acid 2.0  Date & Time Notied:  06/25/19  Provider Notified: Jacinta Shoe  Orders Received/Actions taken: Awaiting

## 2019-06-26 ENCOUNTER — Inpatient Hospital Stay (HOSPITAL_COMMUNITY): Payer: Medicare Other | Admitting: Anesthesiology

## 2019-06-26 ENCOUNTER — Encounter (HOSPITAL_COMMUNITY): Payer: Self-pay | Admitting: *Deleted

## 2019-06-26 ENCOUNTER — Encounter (HOSPITAL_COMMUNITY)
Admission: EM | Disposition: A | Discharge status: Skilled Nursing Facility | Payer: Self-pay | Source: Home / Self Care | Attending: Family Medicine

## 2019-06-26 HISTORY — PX: BIOPSY: SHX5522

## 2019-06-26 HISTORY — PX: ESOPHAGOGASTRODUODENOSCOPY (EGD) WITH PROPOFOL: SHX5813

## 2019-06-26 LAB — BASIC METABOLIC PANEL
Anion gap: 16 — ABNORMAL HIGH (ref 5–15)
BUN: 42 mg/dL — ABNORMAL HIGH (ref 8–23)
CO2: 14 mmol/L — ABNORMAL LOW (ref 22–32)
Calcium: 8.8 mg/dL — ABNORMAL LOW (ref 8.9–10.3)
Chloride: 116 mmol/L — ABNORMAL HIGH (ref 98–111)
Creatinine, Ser: 2.79 mg/dL — ABNORMAL HIGH (ref 0.44–1.00)
GFR calc Af Amer: 19 mL/min — ABNORMAL LOW (ref >60)
GFR calc non Af Amer: 16 mL/min — ABNORMAL LOW (ref >60)
Glucose, Bld: 43 mg/dL — CL (ref 70–99)
Potassium: 4.6 mmol/L (ref 3.5–5.1)
Sodium: 146 mmol/L — ABNORMAL HIGH (ref 135–145)

## 2019-06-26 LAB — CBC
HCT: 41.7 % (ref 36.0–46.0)
Hemoglobin: 14 g/dL (ref 12.0–15.0)
MCH: 30.5 pg (ref 26.0–34.0)
MCHC: 33.6 g/dL (ref 30.0–36.0)
MCV: 90.8 fL (ref 80.0–100.0)
Platelets: 294 10*3/uL (ref 150–400)
RBC: 4.59 MIL/uL (ref 3.87–5.11)
RDW: 16.2 % — ABNORMAL HIGH (ref 11.5–15.5)
WBC: 14.5 10*3/uL — ABNORMAL HIGH (ref 4.0–10.5)
nRBC: 0 % (ref 0.0–0.2)

## 2019-06-26 LAB — CALCIUM, IONIZED: Calcium, Ionized, Serum: 4.5 mg/dL (ref 4.5–5.6)

## 2019-06-26 LAB — GLUCOSE, CAPILLARY
Glucose-Capillary: 39 mg/dL — CL (ref 70–99)
Glucose-Capillary: 126 mg/dL — ABNORMAL HIGH (ref 70–99)
Glucose-Capillary: 98 mg/dL (ref 70–99)

## 2019-06-26 LAB — PARATHYROID HORMONE, INTACT (NO CA): PTH: 33 pg/mL (ref 15–65)

## 2019-06-26 LAB — VITAMIN D 25 HYDROXY (VIT D DEFICIENCY, FRACTURES): Vit D, 25-Hydroxy: 51.6 ng/mL (ref 30.0–100.0)

## 2019-06-26 SURGERY — ESOPHAGOGASTRODUODENOSCOPY (EGD) WITH PROPOFOL
Anesthesia: Monitor Anesthesia Care

## 2019-06-26 MED ORDER — SODIUM BICARBONATE 650 MG PO TABS
650.0000 mg | ORAL_TABLET | Freq: Two times a day (BID) | ORAL | Status: DC
  Administered 2019-06-26 – 2019-07-03 (×15): 650 mg via ORAL
  Filled 2019-06-26 (×15): qty 1

## 2019-06-26 MED ORDER — SODIUM CHLORIDE 0.9 % IV SOLN
INTRAVENOUS | Status: DC | PRN
  Administered 2019-06-26: 25 ug/min via INTRAVENOUS

## 2019-06-26 MED ORDER — DEXTROSE 5 % IV SOLN
INTRAVENOUS | Status: AC
  Administered 2019-06-26: 13:00:00 via INTRAVENOUS

## 2019-06-26 MED ORDER — LORAZEPAM 2 MG/ML IJ SOLN
1.0000 mg | Freq: Once | INTRAMUSCULAR | Status: AC
  Administered 2019-06-26: 1 mg via INTRAVENOUS
  Filled 2019-06-26: qty 1

## 2019-06-26 MED ORDER — PROPOFOL 500 MG/50ML IV EMUL
INTRAVENOUS | Status: DC | PRN
  Administered 2019-06-26: 75 ug/kg/min via INTRAVENOUS

## 2019-06-26 MED ORDER — METOPROLOL SUCCINATE ER 25 MG PO TB24
25.0000 mg | ORAL_TABLET | Freq: Every day | ORAL | Status: DC
  Administered 2019-06-26 – 2019-07-03 (×8): 25 mg via ORAL
  Filled 2019-06-26 (×8): qty 1

## 2019-06-26 MED ORDER — PROPOFOL 10 MG/ML IV BOLUS
INTRAVENOUS | Status: DC | PRN
  Administered 2019-06-26 (×2): 20 mg via INTRAVENOUS

## 2019-06-26 MED ORDER — LIDOCAINE 2% (20 MG/ML) 5 ML SYRINGE
INTRAMUSCULAR | Status: DC | PRN
  Administered 2019-06-26: 40 mg via INTRAVENOUS

## 2019-06-26 MED ORDER — DEXTROSE 50 % IV SOLN: 1.0000 | Freq: Once | INTRAVENOUS | Status: DC

## 2019-06-26 MED ORDER — DEXTROSE 50 % IV SOLN
INTRAVENOUS | Status: AC
  Administered 2019-06-26: 25 mL via INTRAVENOUS
  Filled 2019-06-26: qty 50

## 2019-06-26 MED ORDER — PANTOPRAZOLE SODIUM 40 MG IV SOLR
40.0000 mg | Freq: Two times a day (BID) | INTRAVENOUS | Status: DC
  Administered 2019-06-26 – 2019-06-27 (×4): 40 mg via INTRAVENOUS
  Filled 2019-06-26 (×5): qty 40

## 2019-06-26 SURGICAL SUPPLY — 15 items

## 2019-06-26 NOTE — Progress Notes (Signed)
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 fib, lower GI bleed, discharged on baby aspirin and metoprolol, dysphagia 3 diet. -Presented to the ED yesterday evening from [facility] rehab with complaints of chest pain, coffee-ground emesis, of note patient has severe dementia and is unable to provide any reliable history, her blood pressure was low in the 80s, hemoglobin was found to be 7 yesterday down from 12  A few weeks ago, Hemoccult was positive and she was admitted to stepdown unit. -Overnight, given 2 units of PRBC -Endoscopy 8/21 with severe grade D esophagitis  Assessment & Plan:   Coffee-ground emesis/severe grade D esophagitis -This is felt to be the cause of coffee-ground emesis on admission -She was given 2 units of PRBC for erroneous lab value of hemoglobin of 7 on admission, now at 58 -Gastroenterology following, endoscopy today with severe grade D esophagitis which was felt to be the cause of coffee-ground emesis, continue IV PPI today, aspirin on hold -Restart clears -PT OT, transfer out of stepdown  Hypotension -Multifactorial, poor oral intake, low-grade GI bleed, low EF, multiple antihypertensives -Blood pressure improved in the low 100s now -Hold BiDil, Lasix -Resume metoprolol at lower dose today  ?  Chest pain on admission -Suspect this was secondary to coffee-ground emesis, patient denies any chest pain this morning and does not recall reporting this yesterday  -High-sensitivity troponin is low  Recent hospitalization for COVID pneumonia -Status post VDRF -Improved from the standpoint, repeat COVID testing is negative  AKI on CKD 4 -Creatinine 2.6 at baseline, now 3.6 -Suspect this is cardiorenal and worsened by hypotension -Improved with holding diuretics and blood transfusion  -Back to baseline range, restart diuretics tomorrow, clinically appears close to euvolemic today -Followed by [facility] would be a poor dialysis candidate due to advanced dementia and multiple other comorbidities   CAD, chronic combined CHF with EF 30-35%, status post biventricular pacemaker in 2019 (Medtronic MRI compatible).   -Held Lasix/metoprolol with AKI, hypotension on admission -Restart metoprolol today, and Lasix tomorrow -Aspirin on hold, clinically appears close to euvolemic status today  COPD -Stable, duo nebs as needed  Dementia -Ongoing cognitive decline this year, worsened reportedly since recent hospitalization at [facility] for approximately 1 month with COVID -Pleasantly confused, oriented to self only -Continue Aricept  Stage I sacral pressure injury -Present on admission -Supportive care  DVT prophylaxis: SCDs Code Status:  Partial code, DNI but wants all other resuscitative options Family Communication: no Family at bedside will update son Disposition Plan: Will likely need SNF again  Consultants:   [facility]   Procedures:   Antimicrobials:    Subjective: -Pleasantly confused, tells me she is 73 years old and still working in the [employer] -No events overnight, just back from endoscopy, no reports of further vomiting or emesis/melena per staff -Patient denies any chest pain or dyspnea  Objective: Vitals:   06/26/19 0755 06/26/19 0913 06/26/19 0925 06/26/19 1142  BP: (!) 124/44 (!) 114/56 117/61 105/64  Pulse: 80 77    Resp: 15 16 14  Temp: (!) 97.4 F (36.3 C) 98 F (36.7 C)    TempSrc:  Temporal Temporal    SpO2: 100% 95%    Weight:      Height:        Intake/Output Summary (Last 24 hours) at 06/26/2019 1342 Last data filed at 06/26/2019 1100 Gross per 24 hour  Intake -  Output 1800 ml  Net -1800 ml   Filed Weights   06/24/19 2130  Weight: 56.5 kg    Examination: Gen: elderly frail chronically ill female, pleasantly confused, oriented to self only HEENT: PERRLA, Neck supple, no JVD Lungs: Decreased breath sounds at both bases otherwise clear CVS: RRR,No Gallops,Rubs or new Murmurs Abd: soft, Non tender, non distended, BS present Extremities: No edema Skin: no new rashes Psychiatry: Poor insight and judgment     Data Reviewed:   CBC: Recent Labs  Lab 06/24/19 1348 06/25/19 0853 06/26/19 1013  WBC 9.2 15.3* 14.5*  NEUTROABS 7.5  --   --   HGB 7.0* 13.3 14.0  HCT 21.4* 37.4 41.7  MCV 97.7 87.2 90.8  PLT 270 325 335   Basic Metabolic Panel: Recent Labs  Lab 06/24/19 1348 06/25/19 0531 06/26/19 1013  NA 143 139 146*  K 2.1* 4.6 4.6  CL 125* 106 116*  CO2 13* 19* 14*  GLUCOSE 63* 63* 43*  BUN 45* 58* 42*  CREATININE 2.43* 3.63* 2.79*  CALCIUM 4.6* 8.2* 8.8*  MG  --  2.2  --   PHOS  --  4.5  --    GFR: Estimated Creatinine Clearance: 15.3 mL/min (A) (by C-G formula based on SCr of 2.79 mg/dL (H)). Liver Function Tests: Recent Labs  Lab 06/24/19 1348  AST 14*  ALT 9  ALKPHOS 44  BILITOT 0.7  PROT 3.1*  ALBUMIN 1.4*   Recent Labs  Lab 06/24/19 1348  LIPASE 44   No results for input(s): AMMONIA in the last 168 hours. Coagulation Profile: Recent Labs  Lab 06/24/19 1350  INR 1.5*   Cardiac Enzymes: No results for input(s): CKTOTAL, CKMB, CKMBINDEX, TROPONINI in the last 168 hours. BNP (last 3 results) No results for input(s): PROBNP in the last 8760 hours. HbA1C: No results for input(s): HGBA1C in the last 72 hours. CBG: Recent Labs  Lab 06/24/19 1257 06/26/19 1111 06/26/19 1141  GLUCAP 104* 39* 126*   Lipid  Profile: No results for input(s): CHOL, HDL, LDLCALC, TRIG, CHOLHDL, LDLDIRECT in the last 72 hours. Thyroid Function Tests: No results for input(s): TSH, T4TOTAL, FREET4, T3FREE, THYROIDAB in the last 72 hours. Anemia Panel: No results for input(s): VITAMINB12, FOLATE, FERRITIN, TIBC, IRON, RETICCTPCT in the last 72 hours. Urine analysis:    Component Value Date/Time   COLORURINE YELLOW 05/21/2019 1541   APPEARANCEUR CLEAR 05/21/2019 1541   LABSPEC 1.012 05/21/2019 1541   PHURINE 5.0 05/21/2019 1541   GLUCOSEU NEGATIVE 05/21/2019 1541   HGBUR MODERATE (A) 05/21/2019 1541   BILIRUBINUR NEGATIVE 05/21/2019 1541   KETONESUR NEGATIVE 05/21/2019 1541   PROTEINUR NEGATIVE 05/21/2019 1541   UROBILINOGEN 0.2 04/27/2015 0834   NITRITE NEGATIVE 05/21/2019 1541   LEUKOCYTESUR NEGATIVE 05/21/2019 1541   Sepsis Labs: @LABRCNTIP (procalcitonin:4,lacticidven:4)  ) Recent Results (from the past 240 hour(s))  Culture, blood (routine x 2)     Status: None (Preliminary result)   Collection Time: 06/24/19  1:39 PM   Specimen: BLOOD RIGHT ARM  Result Value Ref Range Status   Specimen Description BLOOD RIGHT ARM  Final   Special Requests  Final    BOTTLES DRAWN AEROBIC AND ANAEROBIC Blood Culture adequate volume   Culture   Final    NO GROWTH 2 DAYS Performed at [facility], [place] [address], [place], [place] [address]    Report Status PENDING  Incomplete  SARS Coronavirus 2 [facility] order, Performed in [facility] hospital lab) Nasopharyngeal Nasopharyngeal Swab     Status: None   Collection Time: 06/24/19  1:42 PM   Specimen: Nasopharyngeal Swab  Result Value Ref Range Status   SARS Coronavirus 2 NEGATIVE NEGATIVE Final    Comment: (NOTE) If result is NEGATIVE SARS-CoV-2 target nucleic acids are NOT DETECTED. The SARS-CoV-2 RNA is generally detectable in upper and lower  respiratory specimens during the acute phase of infection. The lowest  concentration of SARS-CoV-2 viral copies  this assay can detect is 250  copies / mL. A negative result does not preclude SARS-CoV-2 infection  and should not be used as the sole basis for treatment or other  patient management decisions.  A negative result may occur with  improper specimen collection / handling, submission of specimen other  than nasopharyngeal swab, presence of viral mutation(s) within the  areas targeted by this assay, and inadequate number of viral copies  (<250 copies / mL). A negative result must be combined with clinical  observations, patient history, and epidemiological information. If result is POSITIVE SARS-CoV-2 target nucleic acids are DETECTED. The SARS-CoV-2 RNA is generally detectable in upper and lower  respiratory specimens dur ing the acute phase of infection.  Positive  results are indicative of active infection with SARS-CoV-2.  Clinical  correlation with patient history and other diagnostic information is  necessary to determine patient infection status.  Positive results do  not rule out bacterial infection or co-infection with other viruses. If result is PRESUMPTIVE POSTIVE SARS-CoV-2 nucleic acids MAY BE PRESENT.   A presumptive positive result was obtained on the submitted specimen  and confirmed on repeat testing.  While 2019 novel coronavirus  (SARS-CoV-2) nucleic acids may be present in the submitted sample  additional confirmatory testing may be necessary for epidemiological  and / or clinical management purposes  to differentiate between  SARS-CoV-2 and other Sarbecovirus currently known to infect humans.  If clinically indicated additional testing with an alternate test  methodology [phone]) is advised. The SARS-CoV-2 RNA is generally  detectable in upper and lower respiratory sp ecimens during the acute  phase of infection. The expected result is Negative. Fact Sheet for Patients:  StrictlyIdeas.no Fact Sheet for Healthcare Providers:  BankingDealers.co.za This test is not yet approved or cleared by the [place] FDA and has been authorized for detection and/or diagnosis of SARS-CoV-2 by FDA under an Emergency Use Authorization (EUA).  This EUA will remain in effect (meaning this test can be used) for the duration of the COVID-19 declaration under Section 564(b)(1) of the Act, 21 U.S.C. section 360bbb-3(b)(1), unless the authorization is terminated or revoked sooner. Performed at [facility], [place] [address], [place], [place] [address]   Culture, blood (routine x 2)     Status: None (Preliminary result)   Collection Time: 06/25/19  5:31 AM   Specimen: BLOOD LEFT HAND  Result Value Ref Range Status   Specimen Description BLOOD LEFT HAND  Final   Special Requests   Final    BOTTLES DRAWN AEROBIC ONLY Blood Culture adequate volume   Culture   Final    NO GROWTH 1 DAY Performed at [facility]  Lab, [address], [place], [place] [address]    Report Status PENDING  Incomplete         Radiology Studies: No results found.      Scheduled Meds: . atorvastatin  40 mg Oral q1800  . chlorhexidine  15 mL Mouth Rinse BID  . dextrose  1 ampule Intravenous Once  . donepezil  5 mg Oral QHS  . mouth rinse  15 mL Mouth Rinse q12n4p  . pantoprazole (PROTONIX) IV  40 mg Intravenous Q12H  . potassium chloride  40 mEq Oral Once  . sodium bicarbonate  650 mg Oral BID   Continuous Infusions: . calcium chloride    . dextrose 50 mL/hr at 06/26/19 1306  . magnesium sulfate bolus IVPB    . potassium chloride       LOS: 2 days    Time spent: 64min   Domenic Polite, MD [facility] Page via [email or web address], password [email or web address] After 7PM please contact night-coverage  06/26/2019, 1:42 PM

## 2019-06-26 NOTE — Evaluation (Signed)
Physical Therapy Evaluation Patient Details Name: Betty [name] MRN: [id number] DOB: 1946-06-09 Today's Date: 06/26/2019   History of Present Illness  This is a 73 year old female with history of dementia, chronic combined systolic and diastolic CHF, EF of 81%, by V pacemaker, history of non-small cell lung cancer, COPD, stage IV chronic kidney disease was recently hospitalized to [place] hospital with COVID in July, required mechanical intubation, clinically improved and discharged to [facility] rehabilitation on 8/8, this hospitalization was reportedly complicated by new onset paroxysmal A. fib, lower GI bleed, discharged on baby aspirin and metoprolol, dysphagia 3 diet.  Admitted with coffee ground emesis and low hemoglobin now s/p endoscopy positive for esophagitis.  Clinical Impression  Patient presents with decreased independence with mobility due to generalized weakness, imbalance and decreased safety awareness.  Feel she can benefit from skilled PT in the acute setting to allow return to SNF level rehab at d/c.     Follow Up Recommendations SNF;Supervision/Assistance - 24 hour    Equipment Recommendations  None recommended by PT    Recommendations for Other Services       Precautions / Restrictions Precautions Precautions: Fall      Mobility  Bed Mobility Overal bed mobility: Needs Assistance Bed Mobility: Supine to Sit;Sit to Supine     Supine to sit: Min assist Sit to supine: Min assist   General bed mobility comments: assist for balance, using rails, to supine assist for positioning  Transfers Overall transfer level: Needs assistance Equipment used: None;Rolling walker (2 wheeled) Transfers: Sit to/from [facility] Sit to Stand: Min assist Stand pivot transfers: Min assist       General transfer comment: up to [facility] initially min A no device, then with RW stood for assist with hygiene with minguard A for balance then stand step to bed with RW and min A for  safety/lines  Ambulation/Gait                Stairs            Wheelchair Mobility    Modified Rankin (Stroke Patients Only)       Balance Overall balance assessment: Needs assistance   Sitting balance-Leahy Scale: Good     Standing balance support: Bilateral upper extremity supported Standing balance-Leahy Scale: Poor Standing balance comment: reliant on UE support                             Pertinent Vitals/Pain Pain Assessment: No/denies pain    Home Living Family/patient expects to be discharged to:: Skilled nursing facility Living Arrangements: Alone                    Prior Function Level of Independence: Needs assistance   Gait / Transfers Assistance Needed: was in [facility] prior to admission           Hand Dominance        Extremity/Trunk Assessment   Upper Extremity Assessment Upper Extremity Assessment: Generalized weakness    Lower Extremity Assessment Lower Extremity Assessment: Generalized weakness    Cervical / Trunk Assessment Cervical / Trunk Assessment: Kyphotic  Communication   Communication: No difficulties  Cognition Arousal/Alertness: Awake/alert Behavior During Therapy: WFL for tasks assessed/performed Overall Cognitive Status: Impaired/Different from baseline Area of Impairment: Orientation;Attention;Memory;Following commands;Safety/judgement;Awareness;Problem solving                 Orientation Level: Disoriented to;Place;Time;Situation Current Attention Level: Sustained Memory: Decreased  short-term memory Following Commands: Follows one step commands consistently;Follows one step commands with increased time Safety/Judgement: Decreased awareness of deficits   Problem Solving: Requires verbal cues;Requires tactile cues General Comments: Perseverating on the fact that she wants to be buried in the plot she has already paid for and that she wants to make sure she votes.       General Comments General comments (skin integrity, edema, etc.): VSS during session, pt with pure wick and bed soiled with urine, changed linen while pt on BSC, assisted with hygiene and placed clean pure wick    Exercises     Assessment/Plan    PT Assessment Patient needs continued PT services  PT Problem List Decreased strength;Decreased mobility;Decreased safety awareness;Decreased knowledge of use of DME;Decreased balance       PT Treatment Interventions DME instruction;Gait training;Functional mobility training;Therapeutic activities;Therapeutic exercise;Balance training;Cognitive remediation;Patient/family education    PT Goals (Current goals can be found in the Care Plan section)  Acute Rehab PT Goals Patient Stated Goal: none stated PT Goal Formulation: Patient unable to participate in goal setting Time For Goal Achievement: 07/10/19 Potential to Achieve Goals: Good    Frequency Min 2X/week   Barriers to discharge        Co-evaluation               AM-PAC PT "6 Clicks" Mobility  Outcome Measure Help needed turning from your back to your side while in a flat bed without using bedrails?: A Little Help needed moving from lying on your back to sitting on the side of a flat bed without using bedrails?: A Little Help needed moving to and from a bed to a chair (including a wheelchair)?: A Little Help needed standing up from a chair using your arms (e.g., wheelchair or bedside chair)?: A Little Help needed to walk in hospital room?: A Lot Help needed climbing 3-5 steps with a railing? : Total 6 Click Score: 15    End of Session   Activity Tolerance: Patient tolerated treatment well Patient left: in bed;with call bell/phone within reach;with bed alarm set   PT Visit Diagnosis: Muscle weakness (generalized) (M62.81);Other abnormalities of gait and mobility (R26.89)    Time: 8329-1916 PT Time Calculation (min) (ACUTE ONLY): 25 min   Charges:   PT  Evaluation $PT Eval Moderate Complexity: 1 Mod PT Treatments $Therapeutic Activity: 8-22 mins        Magda Kiel, [place] [facility] [phone] 06/26/2019   Betty [name] 06/26/2019, 4:30 PM

## 2019-06-26 NOTE — Anesthesia Preprocedure Evaluation (Addendum)
Anesthesia Evaluation  Patient identified by MRN, date of birth, ID band Patient awake    Reviewed: Allergy & Precautions, NPO status , Patient's Chart, lab work & pertinent test results  Airway Mallampati: II  TM Distance: >3 FB Neck ROM: Full    Dental no notable dental hx. (+) Teeth Intact, Dental Advisory Given   Pulmonary COPD, former smoker,  NSCLC 2016 (s/p RLL lobectomy and adjuvant chemotherapy)   Pulmonary exam normal breath sounds clear to auscultation       Cardiovascular hypertension, + CAD, + Past MI (03/2018) and +CHF  Normal cardiovascular exam+ dysrhythmias Atrial Fibrillation + pacemaker  Rhythm:Regular Rate:Normal  TTE 06/2019 EF 30-35%, valves not assessed  RHC/LHC 2019 Prox RCA lesion 90% stenosed Intervention No interventions have been documented. Right Heart Right atrial pressure- 15/16 Right ventricular pressure-49/9 Pulmonary artery pressure-63/13, mean 33 Pulmonary wedge pressure- A-wave equals 47, V wave equals 44, mean equals 31 LVEDP was 29 Cardiac index by Fick was 2.1 L/min/m, by thermodilution 1.2 L/min/m.     Neuro/Psych PSYCHIATRIC DISORDERS Dementia negative neurological ROS     GI/Hepatic negative GI ROS, Neg liver ROS,   Endo/Other  negative endocrine ROS  Renal/GU Renal InsufficiencyRenal disease  negative genitourinary   Musculoskeletal negative musculoskeletal ROS (+)   Abdominal   Peds  Hematology negative hematology ROS (+)   Anesthesia Other Findings Presented to ED 05/13/19 COVID positive. Required intubation. Discharged to [facility] rehab 06/13/19. Now presents to ED with coffee ground emesis and decreased hemoglobin 12.3-->7, now 13.3. COVID now negative  Reproductive/Obstetrics                            Anesthesia Physical Anesthesia Plan  ASA: IV  Anesthesia Plan: MAC   Post-op Pain Management:    Induction: Intravenous  PONV Risk  Score and Plan: 2 and Propofol infusion and Treatment may vary due to age or medical condition  Airway Management Planned: Natural Airway and Nasal Cannula  Additional Equipment:   Intra-op Plan:   Post-operative Plan:   Informed Consent: I have reviewed the patients History and Physical, chart, labs and discussed the procedure including the risks, benefits and alternatives for the proposed anesthesia with the patient or authorized representative who has indicated his/her understanding and acceptance.   Patient has DNR.  Discussed DNR with patient, Suspend DNR and Discussed DNR with power of attorney.   Dental advisory given  Plan Discussed with: CRNA  Anesthesia Plan Comments: (Discussed DNR with son Eliana Lueth who is also POA. Agree to temporarily suspend DNR. )      Anesthesia Quick Evaluation

## 2019-06-26 NOTE — Consult Note (Signed)
   [facility] Inpatient Consult   06/26/2019  Betty [name] 1946/03/21 [id number]   Patient screened for extreme high risk for unplanned readmission score [37%] with a readmission in the past 30 days in the Medicare ACO.   Patient was from [facility] for rehab and disposition follow up with [facility] RN PAC.  MD notes from 06/25/2019 are as follows from brief narrative:  This is a 73 year old female with history of dementia, chronic combined systolic and diastolic CHF, EF of 69%, by V pacemaker, history of non-small cell lung cancer, COPD, stage IV chronic kidney disease was recently hospitalized to [facility] hospital with COVID in July, required mechanical intubation, clinically improved and discharged to [facility] rehabilitation on 8/8, this hospitalization was reportedly complicated by new onset paroxysmal A. fib, lower GI bleed, discharged on baby aspirin and metoprolol, dysphagia 3 diet. -Presented to the ED yesterday evening from [facility] rehab with complaints of chest pain, coffee-ground emesis, of note patient has severe dementia and is unable to provide any reliable history, her blood pressure was low in the 80s, hemoglobin was found to be 7 yesterday down from 12  A few weeks ago, Hemoccult was positive and she was admitted to stepdown unit. -Overnight, given 2 units of PRBC.   Primary Care Provider is  Josetta Huddle, MD with [facility].  Plan:  Follow up for progress and disposition needs.   Please place a [facility] Care Management consult as appropriate and for questions contact:   Natividad Brood, RN BSN [facility] Liaison  [phone] business mobile phone Toll free office [phone]  Fax number: [phone] [email or web address]

## 2019-06-26 NOTE — Interval H&P Note (Signed)
History and Physical Interval Note:  06/26/2019 8:40 AM  Betty [name]  has presented today for surgery, with the diagnosis of coffee ground emesis.  The various methods of treatment have been discussed with the patient and family. After consideration of risks, benefits and other options for treatment, the patient has consented to  Procedure(s): ESOPHAGOGASTRODUODENOSCOPY (EGD) WITH PROPOFOL (N/A) as a surgical intervention.  The patient's history has been reviewed, patient examined, no change in status, stable for surgery.  I have reviewed the patient's chart and labs.  Questions were answered to the patient's satisfaction.     Lear Ng

## 2019-06-26 NOTE — Brief Op Note (Signed)
Severe ulcerative esophagitis noted on EGD. See endopro note for complete details/recs.

## 2019-06-26 NOTE — Anesthesia Procedure Notes (Signed)
Procedure Name: MAC Performed by: Valda Favia, CRNA Pre-anesthesia Checklist: Patient identified, Emergency Drugs available, Suction available, Patient being monitored and Timeout performed Patient Re-evaluated:Patient Re-evaluated prior to induction Oxygen Delivery Method: Simple face mask Preoxygenation: Pre-oxygenation with 100% oxygen Induction Type: IV induction Placement Confirmation: positive ETCO2 and breath sounds checked- equal and bilateral Dental Injury: Teeth and Oropharynx as per pre-operative assessment

## 2019-06-26 NOTE — Op Note (Addendum)
[facility] Patient Name: Betty [name] Procedure Date : 06/26/2019 MRN: [id number] Attending MD: Lear Ng , MD Date of Birth: 1946/04/08 CSN: [id number] Age: 73 Admit Type: Inpatient Procedure:                Upper GI endoscopy Indications:              Suspected upper gastrointestinal bleeding, Acute                            post hemorrhagic anemia, Coffee-ground emesis Providers:                Lear Ng, MD, Raynelle Bring, RN, Cletis Athens, Technician Referring MD:             hospital team Medicines:                Propofol per Anesthesia, Monitored Anesthesia Care Complications:            No immediate complications. Estimated Blood Loss:     Estimated blood loss was minimal. Procedure:                Pre-Anesthesia Assessment:                           - Prior to the procedure, a History and Physical                            was performed, and patient medications and                            allergies were reviewed. The patient's tolerance of                            previous anesthesia was also reviewed. The risks                            and benefits of the procedure and the sedation                            options and risks were discussed with the patient.                            All questions were answered, and informed consent                            was obtained. Prior Anticoagulants: The patient has                            taken no previous anticoagulant or antiplatelet                            agents. ASA Grade Assessment: IV - A patient with  severe systemic disease that is a constant threat                            to life. After reviewing the risks and benefits,                            the patient was deemed in satisfactory condition to                            undergo the procedure.                           After obtaining informed consent, the endoscope  was                            passed under direct vision. Throughout the                            procedure, the patient's blood pressure, pulse, and                            oxygen saturations were monitored continuously. The                            GIF-H190 ([id number]) Olympus gastroscope was                            introduced through the mouth, and advanced to the                            second part of duodenum. The upper GI endoscopy was                            accomplished without difficulty. The patient                            tolerated the procedure fairly well. Scope In: Scope Out: Findings:      LA Grade D (one or more mucosal breaks involving at least 75% of       esophageal circumference) esophagitis with bleeding was found in the       distal esophagus. Biopsies were taken with a cold forceps for histology.       Estimated blood loss was minimal.      LA Grade C (one or more mucosal breaks continuous between tops of 2 or       more mucosal folds, less than 75% circumference) esophagitis with no       bleeding was found in the mid esophagus.      The Z-line was found 36 cm from the incisors.      A medium-sized hiatal hernia was present.      Segmental mild inflammation characterized by congestion (edema) and       erythema was found in the gastric antrum and in the prepyloric region of       the stomach.      Segmental moderate mucosal changes characterized by congestion,  erythema       and nodularity were found in the duodenal bulb. Biopsies were taken with       a cold forceps for histology. Estimated blood loss was minimal.      The exam of the duodenum was otherwise normal.      Black eschar seen in distal esophagus surrounded by ulcerated mucosa. Impression:               - LA Grade D reflux esophagitis. Rule out Barrett's                            esophagus. Biopsied.                           - LA Grade C esophagitis.                           -  Z-line, 36 cm from the incisors.                           - Medium-sized hiatal hernia.                           - Acute gastritis.                           - Mucosal changes in the duodenum. Biopsied.                           - Severe ulcerative esophagitis with area of black                            eschar consistent with source of her recent GI                            bleeding. Recommendation:           - NPO.                           - Observe patient's clinical course.                           - Await pathology results. Procedure Code(s):        --- Professional ---                           [phone], Esophagogastroduodenoscopy, flexible,                            transoral; with biopsy, single or multiple Diagnosis Code(s):        --- Professional ---                           K92.0, Hematemesis                           K29.00, Acute gastritis without bleeding  D62, Acute posthemorrhagic anemia                           K31.89, Other diseases of stomach and duodenum                           K21.0, Gastro-esophageal reflux disease with                            esophagitis                           K44.9, Diaphragmatic hernia without obstruction or                            gangrene CPT copyright 2019 American Medical Association. All rights reserved. The codes documented in this report are preliminary and upon coder review may  be revised to meet current compliance requirements. Lear Ng, MD 06/26/2019 9:17:17 AM This report has been signed electronically. Number of Addenda: 0

## 2019-06-26 NOTE — Transfer of Care (Signed)
Immediate Anesthesia Transfer of Care Note  Patient: Betty [name]  Procedure(s) Performed: ESOPHAGOGASTRODUODENOSCOPY (EGD) WITH PROPOFOL (N/A ) BIOPSY  Patient Location: Endoscopy Unit  Anesthesia Type:MAC  Level of Consciousness: drowsy  Airway & Oxygen Therapy: Patient Spontanous Breathing  Post-op Assessment: Report given to RN and Post -op Vital signs reviewed and stable  Post vital signs: Reviewed and stable  Last Vitals:  Vitals Value Taken Time  BP    Temp    Pulse 37 06/26/19 0914  Resp 21 06/26/19 0914  SpO2 73 % 06/26/19 0914  Vitals shown include unvalidated device data.  Last Pain:  Vitals:   06/26/19 0755  TempSrc: Temporal  PainSc: 0-No pain         Complications: No apparent anesthesia complications

## 2019-06-26 NOTE — Anesthesia Postprocedure Evaluation (Signed)
Anesthesia Post Note  Patient: Betty [name]  Procedure(s) Performed: ESOPHAGOGASTRODUODENOSCOPY (EGD) WITH PROPOFOL (N/A ) BIOPSY     Patient location during evaluation: PACU Anesthesia Type: MAC Level of consciousness: awake and alert and oriented Pain management: pain level controlled Vital Signs Assessment: post-procedure vital signs reviewed and stable Respiratory status: spontaneous breathing, nonlabored ventilation and respiratory function stable Cardiovascular status: blood pressure returned to baseline Postop Assessment: no apparent nausea or vomiting Anesthetic complications: no    Last Vitals:  Vitals:   06/26/19 0755 06/26/19 0913  BP: (!) 124/44 (!) 114/56  Pulse: 80 77  Resp: 15 16  Temp: (!) 36.3 C 36.7 C  SpO2: 100% 95%    Last Pain:  Vitals:   06/26/19 0913  TempSrc: Temporal  PainSc: 0-No pain                 Brennan Bailey

## 2019-06-26 NOTE — Plan of Care (Signed)

## 2019-06-27 LAB — BASIC METABOLIC PANEL
Anion gap: 9 (ref 5–15)
BUN: 28 mg/dL — ABNORMAL HIGH (ref 8–23)
CO2: 20 mmol/L — ABNORMAL LOW (ref 22–32)
Calcium: 8.5 mg/dL — ABNORMAL LOW (ref 8.9–10.3)
Chloride: 111 mmol/L (ref 98–111)
Creatinine, Ser: 2.44 mg/dL — ABNORMAL HIGH (ref 0.44–1.00)
GFR calc Af Amer: 22 mL/min — ABNORMAL LOW (ref >60)
GFR calc non Af Amer: 19 mL/min — ABNORMAL LOW (ref >60)
Glucose, Bld: 97 mg/dL (ref 70–99)
Potassium: 3.7 mmol/L (ref 3.5–5.1)
Sodium: 140 mmol/L (ref 135–145)

## 2019-06-27 LAB — GLUCOSE, CAPILLARY: Glucose-Capillary: 94 mg/dL (ref 70–99)

## 2019-06-27 LAB — CBC
HCT: 38.6 % (ref 36.0–46.0)
Hemoglobin: 12.9 g/dL (ref 12.0–15.0)
MCH: 30.4 pg (ref 26.0–34.0)
MCHC: 33.4 g/dL (ref 30.0–36.0)
MCV: 91 fL (ref 80.0–100.0)
Platelets: 328 10*3/uL (ref 150–400)
RBC: 4.24 MIL/uL (ref 3.87–5.11)
RDW: 15.8 % — ABNORMAL HIGH (ref 11.5–15.5)
WBC: 11.1 10*3/uL — ABNORMAL HIGH (ref 4.0–10.5)
nRBC: 0 % (ref 0.0–0.2)

## 2019-06-27 NOTE — TOC Initial Note (Signed)
Transition of Care [facility]) - Initial/Assessment Note    Patient Details  Name: Betty [name] MRN: [id number] Date of Birth: 1945/12/11  Transition of Care [facility]) CM/SW Contact:    Gelene Mink, [place] Phone Number: 06/27/2019, 10:00 AM  Clinical Narrative:             CSW called and spoke with the patient's son, Kollyns Mickelson. CSW introduced herself and explained her role. CSW shared the therapy recommendation. Chrissie Noa stated that his mother has been at [facility]. He stated that he did not want her to return back. He felt that she was not taken care of there and he wants to send her to another SNF facility. He stated that his mother was independent at home before she came into the hospital 6 weeks ago.   CSW spoke with him about other SNF facilities. He stated that he needed a list to pick other options. CSW obtained his email address and provided the CMS SNF List. He stated he would inform the CSW of his top choices.   CSW will continue to follow and assist with SNF placement.         Expected Discharge Plan: Skilled Nursing Facility Barriers to Discharge: Continued Medical Work up   Patient Goals and CMS Choice Patient states their goals for this hospitalization and ongoing recovery are:: Pt son would like his mother to continue with therapy CMS Medicare.gov Compare Post Acute Care list provided to:: Patient Represenative (must comment) Choice offered to / list presented to : Adult Children  Expected Discharge Plan and Services Expected Discharge Plan: [place] In-house Referral: Clinical Social Work Discharge Planning Services: NA Post Acute Care Choice: [place] Living arrangements for the past 2 months: [place], [place]                 DME Arranged: N/A DME Agency: NA       HH Arranged: NA [place] Agency: NA        Prior Living Arrangements/Services Living arrangements for the past 2 months: [place],  [place] Lives with:: Facility Resident, Self Patient language and need for interpreter reviewed:: No Do you feel safe going back to the place where you live?: Yes      Need for Family Participation in Patient Care: Yes (Comment) Care giver support system in place?: Yes (comment)   Criminal Activity/Legal Involvement Pertinent to Current Situation/Hospitalization: No - Comment as needed  Activities of Daily Living      Permission Sought/Granted Permission sought to share information with : Case Manager Permission granted to share information with : Yes, Verbal Permission Granted  Share Information with NAME: Chrissie Noa  Permission granted to share info w AGENCY: All SNF  Permission granted to share info w Relationship: Son  Permission granted to share info w Contact Information: [phone]  Emotional Assessment Appearance:: Appears stated age Attitude/Demeanor/Rapport: Unable to Assess Affect (typically observed): Unable to Assess Orientation: : Oriented to Self Alcohol / Substance Use: Not Applicable Psych Involvement: No (comment)  Admission diagnosis:  Precordial pain [R07.2] HCAP (healthcare-associated pneumonia) [J18.9] Severe comorbid illness [R69] Symptomatic anemia [D64.9] Patient Active Problem List   Diagnosis Date Noted  . GI bleed 06/24/2019  . Hypotension 06/24/2019  . Electrolyte abnormality 06/24/2019  . Pressure injury of skin 05/29/2019  . Altered mental status   . Acute respiratory failure with hypoxemia ([place])   . COVID-19 05/14/2019  . Essential hypertension 05/14/2019  . Dementia (  [place]) 07/16/2018  . Cerebral aneurysm, nonruptured 07/16/2018  . Nonischemic cardiomyopathy ([place]) 05/21/2018  . Congestive heart failure, NYHA class 3, chronic, systolic ([place]) 09/31/1216  . Chronic systolic heart failure ([place])   . Chest pain 04/22/2018  . 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 NSTEMI (non-ST elevated myocardial infarction)  ([place]) 03/24/2018  . Pure hypercholesterolemia   . CKD (chronic kidney disease), stage IV ([place])   . Non-small cell carcinoma of right lung, stage 1 ([place]) 05/17/2015  . S/P lobectomy of lung 04/29/2015   PCP:  Josetta Huddle, MD Pharmacy:  No Pharmacies Listed    Social Determinants of Health (SDOH) Interventions    Readmission Risk Interventions No flowsheet data found.

## 2019-06-27 NOTE — Progress Notes (Signed)
[facility] Gastroenterology Progress Note  Betty [name] 73 y.o. [birth year]   Subjective: Resting comfortably and easily arousable.  Objective: Vital signs: Vitals:   06/27/19 0847 06/27/19 0856  BP: 111/61 111/61  Pulse:  90  Resp:    Temp:    SpO2:    T 98.2  Physical Exam: Gen: demented, elderly, thin, no acute distress  HEENT: anicteric sclera CV: RRR Chest: CTA B Abd: soft, nontender, nondistended, +BS   Lab Results: Recent Labs    06/25/19 0531 06/26/19 1013 06/27/19 0912  NA 139 146* 140  K 4.6 4.6 3.7  CL 106 116* 111  CO2 19* 14* 20*  GLUCOSE 63* 43* 97  BUN 58* 42* 28*  CREATININE 3.63* 2.79* 2.44*  CALCIUM 8.2* 8.8* 8.5*  MG 2.2  --   --   PHOS 4.5  --   --    Recent Labs    06/24/19 1348  AST 14*  ALT 9  ALKPHOS 44  BILITOT 0.7  PROT 3.1*  ALBUMIN 1.4*   Recent Labs    06/24/19 1348  06/26/19 1013 06/27/19 0912  WBC 9.2   < > 14.5* 11.1*  NEUTROABS 7.5  --   --   --   HGB 7.0*   < > 14.0 12.9  HCT 21.4*   < > 41.7 38.6  MCV 97.7   < > 90.8 91.0  PLT 270   < > 294 328   < > = values in this interval not displayed.      Assessment/Plan: Severe ulcerative esophagitis tolerating clear liquids. Hgb 12.9. Slowly advance diet. Continue IV Protonix until tolerating solid food and then change to PPI PO BID for 3 months. Will f/u on biopsy results as outpt. F/U with GI prn. Will sign off. Call if questions.   Lear Ng 06/27/2019, 10:46 AM  Questions please call [phone] ID: Betty [name], female   DOB: 05-16-1946, 73 y.o.   MRN: [id number]

## 2019-06-27 NOTE — NC FL2 (Signed)
[place] MEDICAID FL2 LEVEL OF CARE SCREENING TOOL     IDENTIFICATION  Patient Name: Betty [name] Birthdate: [birth year] Sex: female Admission Date (Current Location): 06/24/2019  [facility] and [place] Number:  Herbalist and Address:  [facility], [place] [address], [place], [place] [address]      Provider Number: [id number]  Attending Physician Name and Address:  Mariel Aloe, MD  Relative Name and Phone Number:  Declan, Adamson, [phone]    Current Level of Care: Hospital Recommended Level of Care: [place] Prior Approval Number:    Date Approved/Denied: 03/31/18 PASRR Number: [id number]  Discharge Plan: SNF    Current Diagnoses: Patient Active Problem List   Diagnosis Date Noted  . GI bleed 06/24/2019  . Hypotension 06/24/2019  . Electrolyte abnormality 06/24/2019  . Pressure injury of skin 05/29/2019  . Altered mental status   . Acute respiratory failure with hypoxemia ([place])   . COVID-19 05/14/2019  . Essential hypertension 05/14/2019  . Dementia ([place]) 07/16/2018  . Cerebral aneurysm, nonruptured 07/16/2018  . Nonischemic cardiomyopathy ([place]) 05/21/2018  . Congestive heart failure, NYHA class 3, chronic, systolic ([place]) 92/42/6834  . Chronic systolic heart failure ([place])   . Chest pain 04/22/2018  . 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 NSTEMI (non-ST elevated myocardial infarction) ([place]) 03/24/2018  . Pure hypercholesterolemia   . CKD (chronic kidney disease), stage IV ([place])   . Non-small cell carcinoma of right lung, stage 1 ([place]) 05/17/2015  . S/P lobectomy of lung 04/29/2015    Orientation RESPIRATION BLADDER Height & Weight     Self    Incontinent, External catheter Weight: 124 lb 9 oz (56.5 kg) Height:  5\' 4"  (162.6 cm)  BEHAVIORAL SYMPTOMS/MOOD NEUROLOGICAL BOWEL NUTRITION STATUS      Incontinent    AMBULATORY STATUS COMMUNICATION OF NEEDS Skin   Extensive Assist Verbally  Bruising, Skin abrasions, Other (Comment)(bruising on abdomen; excoriated on perineum, MASD on perineum, pressure injury on coccyx (dressing in place))                       Personal Care Assistance Level of Assistance  Bathing, Feeding, Dressing, Total care Bathing Assistance: Limited assistance Feeding assistance: Independent Dressing Assistance: Limited assistance Total Care Assistance: Limited assistance   Functional Limitations Info  Sight, Hearing, Speech Sight Info: Impaired(Wears glasses) Hearing Info: Adequate Speech Info: Adequate    SPECIAL CARE FACTORS FREQUENCY  PT (By licensed PT), OT (By licensed OT), Speech therapy     PT Frequency: 5x/wk OT Frequency: 5x/wk     Speech Therapy Frequency: 2x/wk      Contractures Contractures Info: Not present    Additional Factors Info  Code Status, Allergies, Psychotropic Code Status Info: Partial Allergies Info: Sulfa Antibiotics Psychotropic Info: donepezil tablet 5mg  at bedtime         Current Medications (06/27/2019):  This is the current hospital active medication list Current Facility-Administered Medications  Medication Dose Route Frequency Provider Last Rate Last Dose  . acetaminophen (TYLENOL) tablet 650 mg  650 mg Oral Q6H PRN Kamineni, Neelima, MD      . albuterol (PROVENTIL) (2.5 MG/3ML) 0.083% nebulizer solution 3 mL  3 mL Inhalation Q6H PRN Kamineni, Neelima, MD      . atorvastatin (LIPITOR) tablet 40 mg  40 mg Oral q1800 Guilford Shi, MD   40 mg at 06/26/19 1756  . calcium chloride 1 g in sodium chloride 0.9 % 100  mL IVPB  1 g Intravenous Once Guilford Shi, MD      . chlorhexidine (PERIDEX) 0.12 % solution 15 mL  15 mL Mouth Rinse BID Domenic Polite, MD   15 mL at 06/27/19 0855  . dextrose 50 % solution 50 mL  1 ampule Intravenous Once Domenic Polite, MD      . donepezil (ARICEPT) tablet 5 mg  5 mg Oral QHS Domenic Polite, MD   5 mg at 06/26/19 1958  . HYDROcodone-acetaminophen  (NORCO/VICODIN) 5-325 MG per tablet 1-2 tablet  1-2 tablet Oral Q4H PRN Kamineni, Neelima, MD      . magnesium sulfate IVPB 1 g 100 mL  1 g Intravenous Once Guilford Shi, MD      . MEDLINE mouth rinse  15 mL Mouth Rinse q12n4p Domenic Polite, MD      . metoprolol succinate (TOPROL-XL) 24 hr tablet 25 mg  25 mg Oral Daily Domenic Polite, MD   25 mg at 06/27/19 0856  . ondansetron (ZOFRAN) tablet 4 mg  4 mg Oral Q6H PRN Guilford Shi, MD       Or  . ondansetron (ZOFRAN) injection 4 mg  4 mg Intravenous Q6H PRN Guilford Shi, MD      . pantoprazole (PROTONIX) injection 40 mg  40 mg Intravenous Q12H Domenic Polite, MD   40 mg at 06/27/19 0856  . potassium chloride (KLOR-CON) packet 40 mEq  40 mEq Oral Once Guilford Shi, MD      . potassium chloride 10 mEq in 100 mL IVPB  10 mEq Intravenous Once Guilford Shi, MD      . sodium bicarbonate tablet 650 mg  650 mg Oral BID Domenic Polite, MD   650 mg at 06/27/19 1438     Discharge Medications: Please see discharge summary for a list of discharge medications.  Relevant Imaging Results:  Relevant Lab Results:   Additional Information SSN: [id number]  Philippa Chester Ji [place], LCSWA

## 2019-06-27 NOTE — Progress Notes (Signed)
PROGRESS NOTE    Betty [name]  WFU:[id number] DOB: Jan 26, 1946 DOA: 06/24/2019 PCP: Josetta Huddle, MD   Brief Narrative: Betty [name] is a 73 y.o. 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 Patient presented secondary to coffee ground emesis secondary to esophagitis.   Assessment & Plan:   Principal Problem:   GI bleed Active Problems:   S/P lobectomy of lung   Non-small cell carcinoma of right lung, stage 1 (HCC)   CKD (chronic kidney disease), stage IV (HCC)   Chest pain   Nonischemic cardiomyopathy (HCC)   Dementia (HCC)   Pressure injury of skin   Hypotension   Electrolyte abnormality   Coffee ground emesis Severe grade D esophagitis Patient is s/p endoscopy on 8/21 significant for severe esophagitis. Biopsy performed. She has received 2 units of PRBC to date. GI signed off. -GI recommendations -PT/OT: SNF on discharge -Biopsy results pending  Hypotension Multifactorial. In setting of GI bleeding, antihypertensives. Blood pressure improved. BiDil and lasix held.  Chest pain Possibly secondary esophagitis. No chest pain currently.  AKI on CKD stage IV Baseline creatinine of 2.6 with peak of 3.63. Appears to be down to baseline.  CAD Chronic combined heart failure Stable with last EF of 30-35%. Patient is s/p b/v pacemaker. -Continue metoprolol -Blood pressure somewhat soft. Will hold Lasix today in setting of BP and AKI.  COPD No wheezing. Stable.  Dementia Stable. Patient with some intermittent agitation.  Recent Covid pneumonia Asymptomatic.   Pressure injury Stage II, mid coccyx, not present on admission   DVT prophylaxis: SCDs Code Status:   Code Status:  Partial Code Family Communication: Son on telephone Disposition Plan: Discharge pending PT eval in addition to continued medical management   Consultants:   PCCM  Neurology  Procedures:   06/25/2019: Transthoracic Echocardiogram IMPRESSIONS    1. The left ventricle has moderate-severely reduced systolic function, with an ejection fraction of 30-35%. The cavity size was normal. There is abnormal septal motion consistent with left bundle branch block. Left ventricular diffuse hypokinesis.  2. The right ventricle has normal systolc function. The cavity was normal. There is no increase in right ventricular wall thickness. Right ventricular systolic pressure could not be assessed.  FINDINGS  Left Ventricle: The left ventricle has moderate-severely reduced systolic function, with an ejection fraction of 30-35%. The cavity size was normal. There is no increase in left ventricular wall thickness. There is abnormal (paradoxical) septal motion,  consistent with left bundle branch block. Left ventricular diffuse hypokinesis.    Right Ventricle: The right ventricle has normal systolic function. The cavity was normal. There is no increase in right ventricular wall thickness. Right ventricular systolic pressure could not be assessed. Pacing wire/catheter visualized in the right  ventricle.  Left Atrium: Left atrial size was normal in size.  Right Atrium: Right atrial size was normal in size. Right atrial pressure is estimated at 10 mmHg.  Interatrial Septum: No atrial level shunt detected by color flow Doppler.  Pericardium: There is no evidence of pericardial effusion.  Mitral Valve: Mitral valve regurgitation was not assessed by color flow Doppler.  Tricuspid Valve: The tricuspid valve was normal in structure. Tricuspid valve regurgitation is trivial by color flow Doppler.  Aortic Valve: Aortic valve  regurgitation was not visualized by color flow Doppler.  Pulmonic Valve:  Pulmonic valve regurgitation is not visualized by color flow Doppler.  Venous: The inferior vena cava was not well visualized. The inferior vena cava is normal in size with greater than 50% respiratory variability.   06/26/2019: EGD Impression:               - LA Grade D reflux esophagitis. Rule out Barrett's                            esophagus. Biopsied.                           - LA Grade C esophagitis.                           - Z-line, 36 cm from the incisors.                           - Medium-sized hiatal hernia.                           - Acute gastritis.                           - Mucosal changes in the duodenum. Biopsied.                           - Severe ulcerative esophagitis with area of black                            eschar consistent with source of her recent GI                            bleeding. Recommendation:           - NPO.                           - Observe patient's clinical course.                           - Await pathology results.  Antimicrobials:  None    Subjective: No chest pain or dyspnea.  Objective: Vitals:   06/26/19 1647 06/26/19 2019 06/26/19 2300 06/27/19 0300  BP: 103/63 (!) 89/65 105/68 (!) 133/93  Pulse: 91 89 96 95  Resp:  20 20 (!) 22  Temp:  98.2 F (36.8 C) 98.7 F (37.1 C) 97.8 F (36.6 C)  TempSrc:  Oral Oral Oral  SpO2:      Weight:      Height:        Intake/Output Summary (Last 24 hours) at 06/27/2019 0811 Last data filed at 06/26/2019 1600 Gross per 24 hour  Intake 101.95 ml  Output 600 ml  Net -498.05 ml   Filed Weights   06/24/19 2130  Weight: 56.5 kg    Examination:  General exam: Appears calm and comfortable Respiratory system: Clear to auscultation. Respiratory effort normal. Cardiovascular system: S1 & S2 heard, RRR. No murmurs, rubs, gallops or clicks. Gastrointestinal system: Abdomen is nondistended, soft and  nontender. No organomegaly or masses felt. Normal bowel sounds heard. Central  nervous system: Alert. No focal neurological deficits. Extremities: No edema. No calf tenderness Skin: No cyanosis. No rashes     Data Reviewed: I have personally reviewed following labs and imaging studies  CBC: Recent Labs  Lab 06/24/19 1348 06/25/19 0853 06/26/19 1013  WBC 9.2 15.3* 14.5*  NEUTROABS 7.5  --   --   HGB 7.0* 13.3 14.0  HCT 21.4* 37.4 41.7  MCV 97.7 87.2 90.8  PLT 270 325 106   Basic Metabolic Panel: Recent Labs  Lab 06/24/19 1348 06/25/19 0531 06/26/19 1013  NA 143 139 146*  K 2.1* 4.6 4.6  CL 125* 106 116*  CO2 13* 19* 14*  GLUCOSE 63* 63* 43*  BUN 45* 58* 42*  CREATININE 2.43* 3.63* 2.79*  CALCIUM 4.6* 8.2* 8.8*  MG  --  2.2  --   PHOS  --  4.5  --    GFR: Estimated Creatinine Clearance: 15.3 mL/min (A) (by C-G formula based on SCr of 2.79 mg/dL (H)). Liver Function Tests: Recent Labs  Lab 06/24/19 1348  AST 14*  ALT 9  ALKPHOS 44  BILITOT 0.7  PROT 3.1*  ALBUMIN 1.4*   Recent Labs  Lab 06/24/19 1348  LIPASE 44   No results for input(s): AMMONIA in the last 168 hours. Coagulation Profile: Recent Labs  Lab 06/24/19 1350  INR 1.5*   Cardiac Enzymes: No results for input(s): CKTOTAL, CKMB, CKMBINDEX, TROPONINI in the last 168 hours. BNP (last 3 results) No results for input(s): PROBNP in the last 8760 hours. HbA1C: No results for input(s): HGBA1C in the last 72 hours. CBG: Recent Labs  Lab 06/24/19 1257 06/26/19 1111 06/26/19 1141 06/26/19 1649  GLUCAP 104* 39* 126* 98   Lipid Profile: No results for input(s): CHOL, HDL, LDLCALC, TRIG, CHOLHDL, LDLDIRECT in the last 72 hours. Thyroid Function Tests: No results for input(s): TSH, T4TOTAL, FREET4, T3FREE, THYROIDAB in the last 72 hours. Anemia Panel: No results for input(s): VITAMINB12, FOLATE, FERRITIN, TIBC, IRON, RETICCTPCT in the last 72 hours. Sepsis Labs: Recent Labs  Lab 06/24/19 1350 06/25/19 0555  LATICACIDVEN 1.0 2.0*    Recent Results (from the past  240 hour(s))  Culture, blood (routine x 2)     Status: None (Preliminary result)   Collection Time: 06/24/19  1:39 PM   Specimen: BLOOD RIGHT ARM  Result Value Ref Range Status   Specimen Description BLOOD RIGHT ARM  Final   Special Requests   Final    BOTTLES DRAWN AEROBIC AND ANAEROBIC Blood Culture adequate volume   Culture   Final    NO GROWTH 2 DAYS Performed at [facility], [place] [address], [place], [place] [address]    Report Status PENDING  Incomplete  SARS Coronavirus 2 [facility] order, Performed in [facility] hospital lab) Nasopharyngeal Nasopharyngeal Swab     Status: None   Collection Time: 06/24/19  1:42 PM   Specimen: Nasopharyngeal Swab  Result Value Ref Range Status   SARS Coronavirus 2 NEGATIVE NEGATIVE Final    Comment: (NOTE) If result is NEGATIVE SARS-CoV-2 target nucleic acids are NOT DETECTED. The SARS-CoV-2 RNA is generally detectable in upper and lower  respiratory specimens during the acute phase of infection. The lowest  concentration of SARS-CoV-2 viral copies this assay can detect is 250  copies / mL. A negative result does not preclude SARS-CoV-2 infection  and should not be used as the sole basis for treatment  or other  patient management decisions.  A negative result may occur with  improper specimen collection / handling, submission of specimen other  than nasopharyngeal swab, presence of viral mutation(s) within the  areas targeted by this assay, and inadequate number of viral copies  (<250 copies / mL). A negative result must be combined with clinical  observations, patient history, and epidemiological information. If result is POSITIVE SARS-CoV-2 target nucleic acids are DETECTED. The SARS-CoV-2 RNA is generally detectable in upper and lower  respiratory specimens dur ing the acute phase of infection.  Positive  results are indicative of active infection with SARS-CoV-2.  Clinical  correlation with patient history and other diagnostic  information is  necessary to determine patient infection status.  Positive results do  not rule out bacterial infection or co-infection with other viruses. If result is PRESUMPTIVE POSTIVE SARS-CoV-2 nucleic acids MAY BE PRESENT.   A presumptive positive result was obtained on the submitted specimen  and confirmed on repeat testing.  While 2019 novel coronavirus  (SARS-CoV-2) nucleic acids may be present in the submitted sample  additional confirmatory testing may be necessary for epidemiological  and / or clinical management purposes  to differentiate between  SARS-CoV-2 and other Sarbecovirus currently known to infect humans.  If clinically indicated additional testing with an alternate test  methodology [phone]) is advised. The SARS-CoV-2 RNA is generally  detectable in upper and lower respiratory sp ecimens during the acute  phase of infection. The expected result is Negative. Fact Sheet for Patients:  StrictlyIdeas.no Fact Sheet for Healthcare Providers: BankingDealers.co.za This test is not yet approved or cleared by the [place] FDA and has been authorized for detection and/or diagnosis of SARS-CoV-2 by FDA under an Emergency Use Authorization (EUA).  This EUA will remain in effect (meaning this test can be used) for the duration of the COVID-19 declaration under Section 564(b)(1) of the Act, 21 U.S.C. section 360bbb-3(b)(1), unless the authorization is terminated or revoked sooner. Performed at [facility], [place] [address], [place], [place] [address]   Culture, blood (routine x 2)     Status: None (Preliminary result)   Collection Time: 06/25/19  5:31 AM   Specimen: BLOOD LEFT HAND  Result Value Ref Range Status   Specimen Description BLOOD LEFT HAND  Final   Special Requests   Final    BOTTLES DRAWN AEROBIC ONLY Blood Culture adequate volume   Culture   Final    NO GROWTH 1 DAY Performed at [facility], [place] [address], [place], [place] [address]    Report Status PENDING  Incomplete         Radiology Studies: No results found.      Scheduled Meds: . atorvastatin  40 mg Oral q1800  . chlorhexidine  15 mL Mouth Rinse BID  . dextrose  1 ampule Intravenous Once  . donepezil  5 mg Oral QHS  . mouth rinse  15 mL Mouth Rinse q12n4p  . metoprolol succinate  25 mg Oral Daily  . pantoprazole (PROTONIX) IV  40 mg Intravenous Q12H  . potassium chloride  40 mEq Oral Once  . sodium bicarbonate  650 mg Oral BID   Continuous Infusions: . calcium chloride    . magnesium sulfate bolus IVPB    . potassium chloride       LOS: 3 days     Cordelia Poche, MD [facility] 06/27/2019, 8:11 AM  If 7PM-7AM, please contact night-coverage [email or web address]

## 2019-06-28 MED ORDER — PANTOPRAZOLE SODIUM 40 MG PO TBEC
40.0000 mg | DELAYED_RELEASE_TABLET | Freq: Two times a day (BID) | ORAL | Status: DC
  Administered 2019-06-28 – 2019-07-03 (×11): 40 mg via ORAL
  Filled 2019-06-28 (×11): qty 1

## 2019-06-28 NOTE — Progress Notes (Signed)
PROGRESS NOTE    Betty [name]  YSA:[id number] DOB: [birth year] DOA: 06/24/2019 PCP: Josetta Huddle, MD   Brief Narrative: Betty [name] is a 73 y.o. female with history of dementia, chronic combined systolic and diastolic CHF, EF of 32%, by V pacemaker, history of non-small cell lung cancer, COPD, stage IV chronic kidney disease was recently hospitalized to [facility] hospital with COVID in July, required mechanical intubation, clinically improved and discharged to [facility] rehabilitation on 8/8. Patient presented secondary to coffee ground emesis secondary to esophagitis.   Assessment & Plan:   Principal Problem:   GI bleed Active Problems:   S/P lobectomy of lung   Non-small cell carcinoma of right lung, stage 1 (HCC)   CKD (chronic kidney disease), stage IV (HCC)   Chest pain   Nonischemic cardiomyopathy (HCC)   Dementia (HCC)   Pressure injury of skin   Hypotension   Electrolyte abnormality   Coffee ground emesis Severe grade D esophagitis Patient is s/p endoscopy on 8/21 significant for severe esophagitis. Biopsy performed. She has received 2 units of PRBC to date. GI signed off. -GI recommendations: PPI BID x3 months, follow-up biopsy results -PT/OT: SNF on discharge -Biopsy results pending  Hypotension Multifactorial. In setting of GI bleeding, antihypertensives. Blood pressure improved. BiDil and lasix held.  Chest pain Possibly secondary esophagitis. No chest pain currently.  AKI on CKD stage IV Baseline creatinine of 2.6 with peak of 3.63. Appears to be down to baseline.  CAD Chronic combined heart failure Stable with last EF of 30-35%. Patient is s/p b/v pacemaker. -Continue metoprolol -Blood pressure somewhat soft. Will hold Lasix today in setting of BP and AKI.  COPD No wheezing. Stable.  Dementia Stable. Patient with some intermittent agitation which appears improved.  Recent Covid pneumonia Asymptomatic.   Pressure injury Stage II, mid coccyx, not present  on admission   DVT prophylaxis: SCDs Code Status:   Code Status: Partial Code Family Communication: Son on telephone Disposition Plan: Discharge pending PT eval in addition to continued medical management   Consultants:   PCCM  Neurology  Procedures:   06/25/2019: Transthoracic Echocardiogram IMPRESSIONS    1. The left ventricle has moderate-severely reduced systolic function, with an ejection fraction of 30-35%. The cavity size was normal. There is abnormal septal motion consistent with left bundle branch block. Left ventricular diffuse hypokinesis.  2. The right ventricle has normal systolc function. The cavity was normal. There is no increase in right ventricular wall thickness. Right ventricular systolic pressure could not be assessed.  FINDINGS  Left Ventricle: The left ventricle has moderate-severely reduced systolic function, with an ejection fraction of 30-35%. The cavity size was normal. There is no increase in left ventricular wall thickness. There is abnormal (paradoxical) septal motion,  consistent with left bundle branch block. Left ventricular diffuse hypokinesis.    Right Ventricle: The right ventricle has normal systolic function. The cavity was normal. There is no increase in right ventricular wall thickness. Right ventricular systolic pressure could not be assessed. Pacing wire/catheter visualized in the right  ventricle.  Left Atrium: Left atrial size was normal in size.  Right Atrium: Right atrial size was normal in size. Right atrial pressure is estimated at 10 mmHg.  Interatrial Septum: No atrial level shunt detected by color flow Doppler.  Pericardium: There is no evidence of pericardial effusion.  Mitral Valve: Mitral valve regurgitation was not assessed by color flow Doppler.  Tricuspid Valve: The tricuspid valve was normal in structure. Tricuspid valve regurgitation is  trivial by color flow Doppler.  Aortic Valve: Aortic valve  regurgitation was not visualized by color flow Doppler.  Pulmonic Valve: Pulmonic valve regurgitation is not visualized by color flow Doppler.  Venous: The inferior vena cava was not well visualized. The inferior vena cava is normal in size with greater than 50% respiratory variability.   06/26/2019: EGD Impression:               - LA Grade D reflux esophagitis. Rule out Barrett's                            esophagus. Biopsied.                           - LA Grade C esophagitis.                           - Z-line, 36 cm from the incisors.                           - Medium-sized hiatal hernia.                           - Acute gastritis.                           - Mucosal changes in the duodenum. Biopsied.                           - Severe ulcerative esophagitis with area of black                            eschar consistent with source of her recent GI                            bleeding. Recommendation:           - NPO.                           - Observe patient's clinical course.                           - Await pathology results.  Antimicrobials:  None    Subjective: No chest pain.  Objective: Vitals:   06/27/19 1817 06/27/19 2300 06/28/19 0500 06/28/19 0822  BP: (!) 98/59 (!) 105/50 106/68 114/72  Pulse: 90 99 91 80  Resp: 18 20 16 16   Temp: 98.2 F (36.8 C) 99.1 F (37.3 C) 98.6 F (37 C) 98.5 F (36.9 C)  TempSrc: Oral Oral Oral Oral  SpO2:  97% 95% 98%  Weight:      Height:        Intake/Output Summary (Last 24 hours) at 06/28/2019 0934 Last data filed at 06/28/2019 0700 Gross per 24 hour  Intake -  Output 502 ml  Net -502 ml   Filed Weights   06/24/19 2130  Weight: 56.5 kg    Examination:  General exam: Appears calm and comfortable Respiratory system: Clear to auscultation. Respiratory effort normal. Cardiovascular system: S1 & S2 heard, RRR. No murmurs, rubs, gallops  or clicks. Gastrointestinal system: Abdomen is nondistended, soft and  nontender. No organomegaly or masses felt. Normal bowel sounds heard. Central nervous system: Alert and oriented to person. No focal neurological deficits. Extremities: No edema. No calf tenderness Skin: No cyanosis. No rashes Psychiatry: Judgement and insight appear impaired. Mood & affect appropriate.     Data Reviewed: I have personally reviewed following labs and imaging studies  CBC: Recent Labs  Lab 06/24/19 1348 06/25/19 0853 06/26/19 1013 06/27/19 0912  WBC 9.2 15.3* 14.5* 11.1*  NEUTROABS 7.5  --   --   --   HGB 7.0* 13.3 14.0 12.9  HCT 21.4* 37.4 41.7 38.6  MCV 97.7 87.2 90.8 91.0  PLT 270 325 294 347   Basic Metabolic Panel: Recent Labs  Lab 06/24/19 1348 06/25/19 0531 06/26/19 1013 06/27/19 0912  NA 143 139 146* 140  K 2.1* 4.6 4.6 3.7  CL 125* 106 116* 111  CO2 13* 19* 14* 20*  GLUCOSE 63* 63* 43* 97  BUN 45* 58* 42* 28*  CREATININE 2.43* 3.63* 2.79* 2.44*  CALCIUM 4.6* 8.2* 8.8* 8.5*  MG  --  2.2  --   --   PHOS  --  4.5  --   --    GFR: Estimated Creatinine Clearance: 17.5 mL/min (A) (by C-G formula based on SCr of 2.44 mg/dL (H)). Liver Function Tests: Recent Labs  Lab 06/24/19 1348  AST 14*  ALT 9  ALKPHOS 44  BILITOT 0.7  PROT 3.1*  ALBUMIN 1.4*   Recent Labs  Lab 06/24/19 1348  LIPASE 44   No results for input(s): AMMONIA in the last 168 hours. Coagulation Profile: Recent Labs  Lab 06/24/19 1350  INR 1.5*   Cardiac Enzymes: No results for input(s): CKTOTAL, CKMB, CKMBINDEX, TROPONINI in the last 168 hours. BNP (last 3 results) No results for input(s): PROBNP in the last 8760 hours. HbA1C: No results for input(s): HGBA1C in the last 72 hours. CBG: Recent Labs  Lab 06/24/19 1257 06/26/19 1111 06/26/19 1141 06/26/19 1649 06/27/19 0924  GLUCAP 104* 39* 126* 98 94   Lipid Profile: No results for input(s): CHOL, HDL, LDLCALC, TRIG, CHOLHDL, LDLDIRECT in the last 72 hours. Thyroid Function Tests: No results for  input(s): TSH, T4TOTAL, FREET4, T3FREE, THYROIDAB in the last 72 hours. Anemia Panel: No results for input(s): VITAMINB12, FOLATE, FERRITIN, TIBC, IRON, RETICCTPCT in the last 72 hours. Sepsis Labs: Recent Labs  Lab 06/24/19 1350 06/25/19 0555  LATICACIDVEN 1.0 2.0*    Recent Results (from the past 240 hour(s))  Culture, blood (routine x 2)     Status: None (Preliminary result)   Collection Time: 06/24/19  1:39 PM   Specimen: BLOOD RIGHT ARM  Result Value Ref Range Status   Specimen Description BLOOD RIGHT ARM  Final   Special Requests   Final    BOTTLES DRAWN AEROBIC AND ANAEROBIC Blood Culture adequate volume   Culture   Final    NO GROWTH 4 DAYS Performed at [facility], [place] [address] [place], [place] [address]    Report Status PENDING  Incomplete  SARS Coronavirus 2 [facility] order, Performed in [facility] lab) Nasopharyngeal Nasopharyngeal Swab     Status: None   Collection Time: 06/24/19  1:42 PM   Specimen: Nasopharyngeal Swab  Result Value Ref Range Status   SARS Coronavirus 2 NEGATIVE NEGATIVE Final    Comment: (NOTE) If result is NEGATIVE SARS-CoV-2 target nucleic acids are NOT DETECTED. The SARS-CoV-2 RNA is generally  detectable in upper and lower  respiratory specimens during the acute phase of infection. The lowest  concentration of SARS-CoV-2 viral copies this assay can detect is 250  copies / mL. A negative result does not preclude SARS-CoV-2 infection  and should not be used as the sole basis for treatment or other  patient management decisions.  A negative result may occur with  improper specimen collection / handling, submission of specimen other  than nasopharyngeal swab, presence of viral mutation(s) within the  areas targeted by this assay, and inadequate number of viral copies  (<250 copies / mL). A negative result must be combined with clinical  observations, patient history, and epidemiological information. If result is POSITIVE  SARS-CoV-2 target nucleic acids are DETECTED. The SARS-CoV-2 RNA is generally detectable in upper and lower  respiratory specimens dur ing the acute phase of infection.  Positive  results are indicative of active infection with SARS-CoV-2.  Clinical  correlation with patient history and other diagnostic information is  necessary to determine patient infection status.  Positive results do  not rule out bacterial infection or co-infection with other viruses. If result is PRESUMPTIVE POSTIVE SARS-CoV-2 nucleic acids MAY BE PRESENT.   A presumptive positive result was obtained on the submitted specimen  and confirmed on repeat testing.  While 2019 novel coronavirus  (SARS-CoV-2) nucleic acids may be present in the submitted sample  additional confirmatory testing may be necessary for epidemiological  and / or clinical management purposes  to differentiate between  SARS-CoV-2 and other Sarbecovirus currently known to infect humans.  If clinically indicated additional testing with an alternate test  methodology [phone]) is advised. The SARS-CoV-2 RNA is generally  detectable in upper and lower respiratory sp ecimens during the acute  phase of infection. The expected result is Negative. Fact Sheet for Patients:  StrictlyIdeas.no Fact Sheet for Healthcare Providers: BankingDealers.co.za This test is not yet approved or cleared by the [place] FDA and has been authorized for detection and/or diagnosis of SARS-CoV-2 by FDA under an Emergency Use Authorization (EUA).  This EUA will remain in effect (meaning this test can be used) for the duration of the COVID-19 declaration under Section 564(b)(1) of the Act, 21 U.S.C. section 360bbb-3(b)(1), unless the authorization is terminated or revoked sooner. Performed at [facility], [place] [address], [place], [place] [address]   Culture, blood (routine x 2)     Status: None (Preliminary  result)   Collection Time: 06/25/19  5:31 AM   Specimen: BLOOD LEFT HAND  Result Value Ref Range Status   Specimen Description BLOOD LEFT HAND  Final   Special Requests   Final    BOTTLES DRAWN AEROBIC ONLY Blood Culture adequate volume   Culture   Final    NO GROWTH 3 DAYS Performed at [facility], [place] [address] [place], [place] [address]    Report Status PENDING  Incomplete         Radiology Studies: No results found.      Scheduled Meds: . atorvastatin  40 mg Oral q1800  . chlorhexidine  15 mL Mouth Rinse BID  . dextrose  1 ampule Intravenous Once  . donepezil  5 mg Oral QHS  . mouth rinse  15 mL Mouth Rinse q12n4p  . metoprolol succinate  25 mg Oral Daily  . pantoprazole (PROTONIX) IV  40 mg Intravenous Q12H  . potassium chloride  40 mEq Oral Once  . sodium bicarbonate  650 mg Oral BID  Continuous Infusions: . calcium chloride    . magnesium sulfate bolus IVPB    . potassium chloride       LOS: 4 days     Cordelia Poche, MD [facility] 06/28/2019, 9:34 AM  If 7PM-7AM, please contact night-coverage [email or web address]

## 2019-06-29 LAB — BASIC METABOLIC PANEL
Anion gap: 11 (ref 5–15)
BUN: 24 mg/dL — ABNORMAL HIGH (ref 8–23)
CO2: 21 mmol/L — ABNORMAL LOW (ref 22–32)
Calcium: 8.1 mg/dL — ABNORMAL LOW (ref 8.9–10.3)
Chloride: 104 mmol/L (ref 98–111)
Creatinine, Ser: 2.47 mg/dL — ABNORMAL HIGH (ref 0.44–1.00)
GFR calc Af Amer: 22 mL/min — ABNORMAL LOW (ref >60)
GFR calc non Af Amer: 19 mL/min — ABNORMAL LOW (ref >60)
Glucose, Bld: 209 mg/dL — ABNORMAL HIGH (ref 70–99)
Potassium: 3.5 mmol/L (ref 3.5–5.1)
Sodium: 136 mmol/L (ref 135–145)

## 2019-06-29 LAB — CBC
HCT: 36.9 % (ref 36.0–46.0)
Hemoglobin: 12.4 g/dL (ref 12.0–15.0)
MCH: 30.4 pg (ref 26.0–34.0)
MCHC: 33.6 g/dL (ref 30.0–36.0)
MCV: 90.4 fL (ref 80.0–100.0)
Platelets: 312 10*3/uL (ref 150–400)
RBC: 4.08 MIL/uL (ref 3.87–5.11)
RDW: 14.8 % (ref 11.5–15.5)
WBC: 8.6 10*3/uL (ref 4.0–10.5)
nRBC: 0 % (ref 0.0–0.2)

## 2019-06-29 LAB — CULTURE, BLOOD (ROUTINE X 2)
Culture: NO GROWTH
Special Requests: ADEQUATE

## 2019-06-29 NOTE — Care Management Important Message (Signed)
Important Message  Patient Details  Name: Betty [name] MRN: [id number] Date of Birth: Jun 29, 1946   Medicare Important Message Given:  Yes     Orbie Pyo 06/29/2019, 2:57 PM

## 2019-06-29 NOTE — Progress Notes (Signed)
Physical Therapy Treatment Patient Details Name: Betty [name] MRN: [id number] DOB: [birth year] Today's Date: 06/29/2019    History of Present Illness This is a 73 year old female with history of dementia, chronic combined systolic and diastolic CHF, EF of 32%, by V pacemaker, history of non-small cell lung cancer, COPD, stage IV chronic kidney disease was recently hospitalized to [facility] hospital with COVID in July, required mechanical intubation, clinically improved and discharged to [facility] rehabilitation on 8/8, this hospitalization was reportedly complicated by new onset paroxysmal A. fib, lower GI bleed, discharged on baby aspirin and metoprolol, dysphagia 3 diet.  Admitted with coffee ground emesis and low hemoglobin now s/p endoscopy positive for esophagitis.    PT Comments    Continuing work on functional mobility and activity tolerance;  Lots of encouragement, but able to walk with RW in hallways with min assist for safety; continue to recommend dc to SNF to finish out her rehab course and maximize independence and safety with mobility    Follow Up Recommendations  SNF;Supervision/Assistance - 24 hour     Equipment Recommendations  Rolling walker with 5" wheels;3in1 (PT)    Recommendations for Other Services       Precautions / Restrictions Precautions Precautions: Fall Precaution Comments: requires encouragement for OOB activity    Mobility  Bed Mobility Overal bed mobility: Needs Assistance Bed Mobility: Supine to Sit;Sit to Supine     Supine to sit: Min assist Sit to supine: Min guard   General bed mobility comments: Min handheld assist to pull to sit  Transfers Overall transfer level: Needs assistance Equipment used: Rolling walker (2 wheeled) Transfers: Sit to/from Stand Sit to Stand: Min assist Stand pivot transfers: Min assist       General transfer comment: assist to rise and steady, cues for hand placement  Ambulation/Gait Ambulation/Gait assistance: Min  assist Gait Distance (Feet): 40 Feet Assistive device: Rolling walker (2 wheeled) Gait Pattern/deviations: Step-through pattern;Trunk flexed     General Gait Details: Able to correct posture with cues and reminders; min assist to steady, and lots of encouragement to poarticipate   Stairs             Wheelchair Mobility    Modified Rankin (Stroke Patients Only)       Balance Overall balance assessment: Needs assistance   Sitting balance-Leahy Scale: Good     Standing balance support: Bilateral upper extremity supported Standing balance-Leahy Scale: Poor Standing balance comment: reliant on UE support for dynamic balance                            Cognition Arousal/Alertness: Awake/alert Behavior During Therapy: WFL for tasks assessed/performed Overall Cognitive Status: Impaired/Different from baseline Area of Impairment: Orientation;Attention;Memory;Following commands;Safety/judgement;Awareness;Problem solving                 Orientation Level: Disoriented to;Place;Time;Situation Current Attention Level: Sustained Memory: Decreased short-term memory Following Commands: Follows one step commands consistently;Follows one step commands with increased time Safety/Judgement: Decreased awareness of deficits Awareness: Intellectual Problem Solving: Requires verbal cues;Requires tactile cues General Comments: She would refer to a person named [name] throughout the session -- she truly cares for him (her son?)      Exercises      General Comments        Pertinent Vitals/Pain Pain Assessment: No/denies pain Faces Pain Scale: Hurts little more Pain Location: general discomfort Pain Descriptors / Indicators: Aching Pain Intervention(s): Monitored during session;Repositioned    Home  Living Family/patient expects to be discharged to:: Skilled nursing facility Living Arrangements: Alone                  Prior Function Level of Independence:  Needs assistance  Gait / Transfers Assistance Needed: walks with a walker ADL's / Homemaking Assistance Needed: reports independence in ADL Comments: pt is a poor historian   PT Goals (current goals can now be found in the care plan section) Acute Rehab PT Goals Patient Stated Goal: Today all she wanted was to nap PT Goal Formulation: Patient unable to participate in goal setting Time For Goal Achievement: 07/10/19 Potential to Achieve Goals: Good Progress towards PT goals: Progressing toward goals    Frequency    Min 2X/week      PT Plan Current plan remains appropriate    Co-evaluation              AM-PAC PT "6 Clicks" Mobility   Outcome Measure  Help needed turning from your back to your side while in a flat bed without using bedrails?: A Little Help needed moving from lying on your back to sitting on the side of a flat bed without using bedrails?: A Little Help needed moving to and from a bed to a chair (including a wheelchair)?: A Little Help needed standing up from a chair using your arms (e.g., wheelchair or bedside chair)?: A Little Help needed to walk in hospital room?: A Little Help needed climbing 3-5 steps with a railing? : Total 6 Click Score: 16    End of Session Equipment Utilized During Treatment: Gait belt Activity Tolerance: Patient tolerated treatment well Patient left: in bed;with call bell/phone within reach;with bed alarm set Nurse Communication: Mobility status PT Visit Diagnosis: Muscle weakness (generalized) (M62.81);Other abnormalities of gait and mobility (R26.89)     Time: 1001-1017 PT Time Calculation (min) (ACUTE ONLY): 16 min  Charges:  $Gait Training: 8-22 mins                     Roney Marion, PT  Acute Rehabilitation Services Pager [phone] Office [phone]    Colletta [place] 06/29/2019, 1:27 PM

## 2019-06-29 NOTE — Progress Notes (Signed)
Patient alert with intermittent confusion, able to be re-directed.  Slept through shift with no distress.  Complained of mild pain to her right leg, relieved with pain medication. Stable condition at end of shift.  Will continue to monitor.

## 2019-06-29 NOTE — Progress Notes (Signed)
PROGRESS NOTE    Betty [name]  QMV:[id number] DOB: 1946-05-23 DOA: 06/24/2019 PCP: Josetta Huddle, MD   Brief Narrative: Betty [name] is a 73 y.o. female with history of dementia, chronic combined systolic and diastolic CHF, EF of 28%, by V pacemaker, history of non-small cell lung cancer, COPD, stage IV chronic kidney disease was recently hospitalized to [facility] hospital with COVID in July, required mechanical intubation, clinically improved and discharged to [facility] rehabilitation on 8/8. Patient presented secondary to coffee ground emesis secondary to esophagitis.   Assessment & Plan:   Principal Problem:   GI bleed Active Problems:   S/P lobectomy of lung   Non-small cell carcinoma of right lung, stage 1 (HCC)   CKD (chronic kidney disease), stage IV (HCC)   Chest pain   Nonischemic cardiomyopathy (HCC)   Dementia (HCC)   Pressure injury of skin   Hypotension   Electrolyte abnormality   Coffee ground emesis Severe grade D esophagitis Patient is s/p endoscopy on 8/21 significant for severe esophagitis. Biopsy performed. She has received 2 units of PRBC to date. GI signed off. -GI recommendations: PPI BID x3 months, follow-up biopsy results -PT/OT: SNF on discharge -Biopsy (8/21) results pending  Hypotension Multifactorial. In setting of GI bleeding, antihypertensives. Blood pressure improved. BiDil and lasix held.  Chest pain Possibly secondary esophagitis. No chest pain currently.  AKI on CKD stage IV Baseline creatinine of 2.6 with peak of 3.63. Down to baseline and is stable.  CAD Chronic combined heart failure Stable with last EF of 30-35%. Patient is s/p b/v pacemaker. -Continue metoprolol -Blood pressure somewhat soft and stable. Will hold Lasix today in setting of BP and AKI. Fluid status is normal.  COPD No wheezing. Stable.  Dementia Stable. Patient with some intermittent agitation which appears improved.  Recent Covid pneumonia Asymptomatic.   Pressure  injury Stage II, mid coccyx, not present on admission   DVT prophylaxis: SCDs Code Status:   Code Status: Partial Code Family Communication: Son on telephone Disposition Plan: Discharge to SNF pending bed. Medically stable for discharge.   Consultants:   PCCM  Neurology  Procedures:   06/25/2019: Transthoracic Echocardiogram IMPRESSIONS    1. The left ventricle has moderate-severely reduced systolic function, with an ejection fraction of 30-35%. The cavity size was normal. There is abnormal septal motion consistent with left bundle branch block. Left ventricular diffuse hypokinesis.  2. The right ventricle has normal systolc function. The cavity was normal. There is no increase in right ventricular wall thickness. Right ventricular systolic pressure could not be assessed.  FINDINGS  Left Ventricle: The left ventricle has moderate-severely reduced systolic function, with an ejection fraction of 30-35%. The cavity size was normal. There is no increase in left ventricular wall thickness. There is abnormal (paradoxical) septal motion,  consistent with left bundle branch block. Left ventricular diffuse hypokinesis.    Right Ventricle: The right ventricle has normal systolic function. The cavity was normal. There is no increase in right ventricular wall thickness. Right ventricular systolic pressure could not be assessed. Pacing wire/catheter visualized in the right  ventricle.  Left Atrium: Left atrial size was normal in size.  Right Atrium: Right atrial size was normal in size. Right atrial pressure is estimated at 10 mmHg.  Interatrial Septum: No atrial level shunt detected by color flow Doppler.  Pericardium: There is no evidence of pericardial effusion.  Mitral Valve: Mitral valve regurgitation was not assessed by color flow Doppler.  Tricuspid Valve: The tricuspid valve was normal  in structure. Tricuspid valve regurgitation is trivial by color flow Doppler.   Aortic Valve: Aortic valve regurgitation was not visualized by color flow Doppler.  Pulmonic Valve: Pulmonic valve regurgitation is not visualized by color flow Doppler.  Venous: The inferior vena cava was not well visualized. The inferior vena cava is normal in size with greater than 50% respiratory variability.   06/26/2019: EGD Impression:               - LA Grade D reflux esophagitis. Rule out Barrett's                            esophagus. Biopsied.                           - LA Grade C esophagitis.                           - Z-line, 36 cm from the incisors.                           - Medium-sized hiatal hernia.                           - Acute gastritis.                           - Mucosal changes in the duodenum. Biopsied.                           - Severe ulcerative esophagitis with area of black                            eschar consistent with source of her recent GI                            bleeding. Recommendation:           - NPO.                           - Observe patient's clinical course.                           - Await pathology results.  Antimicrobials:  None    Subjective: No issues overnight.  Objective: Vitals:   06/28/19 1739 06/28/19 2213 06/29/19 0524 06/29/19 0819  BP: 109/71 106/66  (!) 120/55  Pulse: 82 86  88  Resp: 18   17  Temp: 100 F (37.8 C)   97.8 F (36.6 C)  TempSrc: Oral     SpO2: 100% 97% 98% 95%  Weight:      Height:        Intake/Output Summary (Last 24 hours) at 06/29/2019 1253 Last data filed at 06/29/2019 1230 Gross per 24 hour  Intake 120 ml  Output 1 ml  Net 119 ml   Filed Weights   06/24/19 2130  Weight: 56.5 kg    Examination:  General exam: Appears calm and comfortable Respiratory system: Clear to auscultation. Respiratory effort normal. Cardiovascular system: S1 & S2 heard, RRR. No murmurs, rubs, gallops  or clicks. Gastrointestinal system: Abdomen is nondistended, soft and nontender. No  organomegaly or masses felt. Normal bowel sounds heard. Central nervous system: Alert and oriented to person only. No focal neurological deficits. Extremities: No edema. No calf tenderness Skin: No cyanosis. No rashes Psychiatry: Judgement and insight appear normal. Mood & affect appropriate.      Data Reviewed: I have personally reviewed following labs and imaging studies  CBC: Recent Labs  Lab 06/24/19 1348 06/25/19 0853 06/26/19 1013 06/27/19 0912 06/29/19 0951  WBC 9.2 15.3* 14.5* 11.1* 8.6  NEUTROABS 7.5  --   --   --   --   HGB 7.0* 13.3 14.0 12.9 12.4  HCT 21.4* 37.4 41.7 38.6 36.9  MCV 97.7 87.2 90.8 91.0 90.4  PLT 270 325 294 328 456   Basic Metabolic Panel: Recent Labs  Lab 06/24/19 1348 06/25/19 0531 06/26/19 1013 06/27/19 0912 06/29/19 0951  NA 143 139 146* 140 136  K 2.1* 4.6 4.6 3.7 3.5  CL 125* 106 116* 111 104  CO2 13* 19* 14* 20* 21*  GLUCOSE 63* 63* 43* 97 209*  BUN 45* 58* 42* 28* 24*  CREATININE 2.43* 3.63* 2.79* 2.44* 2.47*  CALCIUM 4.6* 8.2* 8.8* 8.5* 8.1*  MG  --  2.2  --   --   --   PHOS  --  4.5  --   --   --    GFR: Estimated Creatinine Clearance: 17.3 mL/min (A) (by C-G formula based on SCr of 2.47 mg/dL (H)). Liver Function Tests: Recent Labs  Lab 06/24/19 1348  AST 14*  ALT 9  ALKPHOS 44  BILITOT 0.7  PROT 3.1*  ALBUMIN 1.4*   Recent Labs  Lab 06/24/19 1348  LIPASE 44   No results for input(s): AMMONIA in the last 168 hours. Coagulation Profile: Recent Labs  Lab 06/24/19 1350  INR 1.5*   Cardiac Enzymes: No results for input(s): CKTOTAL, CKMB, CKMBINDEX, TROPONINI in the last 168 hours. BNP (last 3 results) No results for input(s): PROBNP in the last 8760 hours. HbA1C: No results for input(s): HGBA1C in the last 72 hours. CBG: Recent Labs  Lab 06/24/19 1257 06/26/19 1111 06/26/19 1141 06/26/19 1649 06/27/19 0924  GLUCAP 104* 39* 126* 98 94   Lipid Profile: No results for input(s): CHOL, HDL, LDLCALC,  TRIG, CHOLHDL, LDLDIRECT in the last 72 hours. Thyroid Function Tests: No results for input(s): TSH, T4TOTAL, FREET4, T3FREE, THYROIDAB in the last 72 hours. Anemia Panel: No results for input(s): VITAMINB12, FOLATE, FERRITIN, TIBC, IRON, RETICCTPCT in the last 72 hours. Sepsis Labs: Recent Labs  Lab 06/24/19 1350 06/25/19 0555  LATICACIDVEN 1.0 2.0*    Recent Results (from the past 240 hour(s))  Culture, blood (routine x 2)     Status: None   Collection Time: 06/24/19  1:39 PM   Specimen: BLOOD RIGHT ARM  Result Value Ref Range Status   Specimen Description BLOOD RIGHT ARM  Final   Special Requests   Final    BOTTLES DRAWN AEROBIC AND ANAEROBIC Blood Culture adequate volume   Culture   Final    NO GROWTH 5 DAYS Performed at [facility], [place] [address] [place], [place] [address]    Report Status 06/29/2019 FINAL  Final  SARS Coronavirus 2 [facility] order, Performed in [facility] hospital lab) Nasopharyngeal Nasopharyngeal Swab     Status: None   Collection Time: 06/24/19  1:42 PM   Specimen: Nasopharyngeal Swab  Result Value Ref Range Status  SARS Coronavirus 2 NEGATIVE NEGATIVE Final    Comment: (NOTE) If result is NEGATIVE SARS-CoV-2 target nucleic acids are NOT DETECTED. The SARS-CoV-2 RNA is generally detectable in upper and lower  respiratory specimens during the acute phase of infection. The lowest  concentration of SARS-CoV-2 viral copies this assay can detect is 250  copies / mL. A negative result does not preclude SARS-CoV-2 infection  and should not be used as the sole basis for treatment or other  patient management decisions.  A negative result may occur with  improper specimen collection / handling, submission of specimen other  than nasopharyngeal swab, presence of viral mutation(s) within the  areas targeted by this assay, and inadequate number of viral copies  (<250 copies / mL). A negative result must be combined with clinical  observations,  patient history, and epidemiological information. If result is POSITIVE SARS-CoV-2 target nucleic acids are DETECTED. The SARS-CoV-2 RNA is generally detectable in upper and lower  respiratory specimens dur ing the acute phase of infection.  Positive  results are indicative of active infection with SARS-CoV-2.  Clinical  correlation with patient history and other diagnostic information is  necessary to determine patient infection status.  Positive results do  not rule out bacterial infection or co-infection with other viruses. If result is PRESUMPTIVE POSTIVE SARS-CoV-2 nucleic acids MAY BE PRESENT.   A presumptive positive result was obtained on the submitted specimen  and confirmed on repeat testing.  While 2019 novel coronavirus  (SARS-CoV-2) nucleic acids may be present in the submitted sample  additional confirmatory testing may be necessary for epidemiological  and / or clinical management purposes  to differentiate between  SARS-CoV-2 and other Sarbecovirus currently known to infect humans.  If clinically indicated additional testing with an alternate test  methodology [phone]) is advised. The SARS-CoV-2 RNA is generally  detectable in upper and lower respiratory sp ecimens during the acute  phase of infection. The expected result is Negative. Fact Sheet for Patients:  StrictlyIdeas.no Fact Sheet for Healthcare Providers: BankingDealers.co.za This test is not yet approved or cleared by the [place] FDA and has been authorized for detection and/or diagnosis of SARS-CoV-2 by FDA under an Emergency Use Authorization (EUA).  This EUA will remain in effect (meaning this test can be used) for the duration of the COVID-19 declaration under Section 564(b)(1) of the Act, 21 U.S.C. section 360bbb-3(b)(1), unless the authorization is terminated or revoked sooner. Performed at [facility], [place] [address], [place], [place]  [address]   Culture, blood (routine x 2)     Status: None (Preliminary result)   Collection Time: 06/25/19  5:31 AM   Specimen: BLOOD LEFT HAND  Result Value Ref Range Status   Specimen Description BLOOD LEFT HAND  Final   Special Requests   Final    BOTTLES DRAWN AEROBIC ONLY Blood Culture adequate volume   Culture   Final    NO GROWTH 4 DAYS Performed at [facility], [place] [address], [place], [place] [address]    Report Status PENDING  Incomplete         Radiology Studies: No results found.      Scheduled Meds: . atorvastatin  40 mg Oral q1800  . chlorhexidine  15 mL Mouth Rinse BID  . dextrose  1 ampule Intravenous Once  . donepezil  5 mg Oral QHS  . mouth rinse  15 mL Mouth Rinse q12n4p  . metoprolol succinate  25 mg Oral Daily  .  pantoprazole  40 mg Oral BID  . sodium bicarbonate  650 mg Oral BID   Continuous Infusions: . calcium chloride    . magnesium sulfate bolus IVPB       LOS: 5 days     Cordelia Poche, MD [facility] 06/29/2019, 12:53 PM  If 7PM-7AM, please contact night-coverage [email or web address]

## 2019-06-29 NOTE — Progress Notes (Signed)
OT Cancellation Note  Patient Details Name: Betty [name] MRN: [id number] DOB: 1945/12/20   Cancelled Treatment:    Reason Eval/Treat Not Completed: Fatigue/lethargy limiting ability to participate. Pt working with PT and then wants to take a nap, will return.  Malka So 06/29/2019, 10:33 AM  Nestor Lewandowsky, OTR/L Acute Rehabilitation Services Pager: [phone] Office: [phone]

## 2019-06-29 NOTE — Evaluation (Signed)
Occupational Therapy Evaluation Patient Details Name: Betty [name] MRN: [id number] DOB: 1946/10/19 Today's Date: 06/29/2019    History of Present Illness This is a 74 year old female with history of dementia, chronic combined systolic and diastolic CHF, EF of 60%, by V pacemaker, history of non-small cell lung cancer, COPD, stage IV chronic kidney disease was recently hospitalized to [facility] hospital with COVID in July, required mechanical intubation, clinically improved and discharged to [facility] rehabilitation on 8/8, this hospitalization was reportedly complicated by new onset paroxysmal A. fib, lower GI bleed, discharged on baby aspirin and metoprolol, dysphagia 3 diet.  Admitted with coffee ground emesis and low hemoglobin now s/p endoscopy positive for esophagitis.   Clinical Impression   Pt reports she was walking with a walker depending on her level of pain. She states she can take care of herself. Pt is a poor historian with h/o dementia. She thinks she is in the hospital to help with "the band."  Pt currently requiring min to mod assist for ADL and min assist for mobility. Recommending further rehab in SNF upon discharge. Will follow acutely.     Follow Up Recommendations  SNF;Supervision/Assistance - 24 hour    Equipment Recommendations  Other (comment)(defer to next venue)    Recommendations for Other Services       Precautions / Restrictions Precautions Precautions: Fall Precaution Comments: requires encouragement for OOB activity Restrictions Weight Bearing Restrictions: No      Mobility Bed Mobility Overal bed mobility: Needs Assistance Bed Mobility: Supine to Sit;Sit to Supine     Supine to sit: Min assist Sit to supine: Min assist   General bed mobility comments: use of rail, min assist raise trunk and for LEs back into bed  Transfers Overall transfer level: Needs assistance Equipment used: Rolling walker (2 wheeled) Transfers: Sit to/from Stand Sit to Stand: Min  assist Stand pivot transfers: Min assist       General transfer comment: assist to rise and steady, cues for hand placement    Balance Overall balance assessment: Needs assistance   Sitting balance-Leahy Scale: Good     Standing balance support: Bilateral upper extremity supported Standing balance-Leahy Scale: Poor Standing balance comment: reliant on UE support for dynamic balance                           ADL either performed or assessed with clinical judgement   ADL Overall ADL's : Needs assistance/impaired Eating/Feeding: Minimal assistance;Sitting Eating/Feeding Details (indicate cue type and reason): assist to open containers Grooming: Wash/dry hands;Sitting;Set up   Upper Body Bathing: Minimal assistance;Sitting   Lower Body Bathing: Sit to/from stand;Moderate assistance   Upper Body Dressing : Minimal assistance;Sitting   Lower Body Dressing: Moderate assistance;Sit to/from stand   Toilet Transfer: Minimal assistance;Ambulation;RW   Toileting- Clothing Manipulation and Hygiene: Maximal assistance;Sit to/from stand       Functional mobility during ADLs: Minimal assistance;Rolling walker       Vision Patient Visual Report: No change from baseline       Perception     Praxis      Pertinent Vitals/Pain Pain Assessment: Faces Faces Pain Scale: Hurts little more Pain Location: general discomfort Pain Descriptors / Indicators: Aching Pain Intervention(s): Monitored during session;Repositioned     Hand Dominance Right   Extremity/Trunk Assessment Upper Extremity Assessment Upper Extremity Assessment: Generalized weakness   Lower Extremity Assessment Lower Extremity Assessment: Defer to PT evaluation   Cervical / Trunk Assessment Cervical /  Trunk Assessment: Kyphotic   Communication Communication Communication: No difficulties   Cognition Arousal/Alertness: Awake/alert Behavior During Therapy: WFL for tasks  assessed/performed Overall Cognitive Status: Impaired/Different from baseline Area of Impairment: Orientation;Attention;Memory;Following commands;Safety/judgement;Awareness;Problem solving                 Orientation Level: Disoriented to;Place;Time;Situation Current Attention Level: Sustained Memory: Decreased short-term memory Following Commands: Follows one step commands consistently;Follows one step commands with increased time Safety/Judgement: Decreased awareness of deficits Awareness: Intellectual Problem Solving: Requires verbal cues;Requires tactile cues General Comments: no family available to offer information about baseline cognition   General Comments       Exercises     Shoulder Instructions      Home Living Family/patient expects to be discharged to:: Skilled nursing facility Living Arrangements: Alone                                      Prior Functioning/Environment Level of Independence: Needs assistance  Gait / Transfers Assistance Needed: walks with a walker ADL's / Homemaking Assistance Needed: reports independence in ADL   Comments: pt is a poor historian        OT Problem List: Decreased strength;Decreased range of motion;Decreased activity tolerance;Impaired balance (sitting and/or standing);Decreased cognition;Decreased safety awareness;Decreased knowledge of use of DME or AE;Decreased knowledge of precautions;Cardiopulmonary status limiting activity      OT Treatment/Interventions: Self-care/ADL training;Therapeutic exercise;Neuromuscular education;DME and/or AE instruction;Therapeutic activities;Patient/family education;Visual/perceptual remediation/compensation;Balance training;Energy conservation    OT Goals(Current goals can be found in the care plan section) Acute Rehab OT Goals Patient Stated Goal: none stated OT Goal Formulation: Patient unable to participate in goal setting Time For Goal Achievement:  07/20/19 Potential to Achieve Goals: Fair ADL Goals Pt Will Perform Grooming: with min guard assist;standing Pt Will Perform Upper Body Dressing: with supervision;with set-up;sitting Pt Will Perform Lower Body Dressing: with min guard assist;sit to/from stand Pt Will Transfer to Toilet: with min guard assist;ambulating;bedside commode Pt Will Perform Toileting - Clothing Manipulation and hygiene: with min assist;sit to/from stand Additional ADL Goal #1: Pt will perform bed mobility with supervision in preparation for ADL.  OT Frequency: Min 2X/week   Barriers to D/C: Decreased caregiver support          Co-evaluation              AM-PAC OT "6 Clicks" Daily Activity     Outcome Measure Help from another person eating meals?: A Little Help from another person taking care of personal grooming?: A Little Help from another person toileting, which includes using toliet, bedpan, or urinal?: A Lot Help from another person bathing (including washing, rinsing, drying)?: A Lot Help from another person to put on and taking off regular upper body clothing?: A Lot Help from another person to put on and taking off regular lower body clothing?: A Lot 6 Click Score: 14   End of Session Equipment Utilized During Treatment: Gait belt;Rolling walker  Activity Tolerance: Patient tolerated treatment well Patient left: in bed;with call bell/phone within reach;with bed alarm set  OT Visit Diagnosis: Muscle weakness (generalized) (M62.81);Other symptoms and signs involving cognitive function;Unsteadiness on feet (R26.81)                Time: 1660-6301 OT Time Calculation (min): 18 min Charges:  OT General Charges $OT Visit: 1 Visit OT Evaluation $OT Eval Moderate Complexity: 1 Mod  Nestor Lewandowsky, OTR/L Acute Rehabilitation Services  Pager: [phone] Office: [phone]  Malka So 06/29/2019, 12:02 PM

## 2019-06-30 LAB — CULTURE, BLOOD (ROUTINE X 2)
Culture: NO GROWTH
Special Requests: ADEQUATE

## 2019-06-30 NOTE — Progress Notes (Signed)
PROGRESS NOTE    Betty [name]  CHY:[id number] DOB: 1946-02-22 DOA: 06/24/2019 PCP: Josetta Huddle, MD   Brief Narrative: Betty [name] is a 73 y.o. female with history of dementia, chronic combined systolic and diastolic CHF, EF of 87%, by V pacemaker, history of non-small cell lung cancer, COPD, stage IV chronic kidney disease was recently hospitalized to [facility] hospital with COVID in July, required mechanical intubation, clinically improved and discharged to [facility] rehabilitation on 8/8. Patient presented secondary to coffee ground emesis secondary to esophagitis.   Assessment & Plan:   Principal Problem:   GI bleed Active Problems:   S/P lobectomy of lung   Non-small cell carcinoma of right lung, stage 1 (HCC)   CKD (chronic kidney disease), stage IV (HCC)   Chest pain   Nonischemic cardiomyopathy (HCC)   Dementia (HCC)   Pressure injury of skin   Hypotension   Electrolyte abnormality   Coffee ground emesis Severe grade D esophagitis Patient is s/p endoscopy on 8/21 significant for severe esophagitis. Biopsy performed. She has received 2 units of PRBC to date. GI signed off. Biopsy suggests chronic duodenitis. -GI recommendations: PPI BID x3 months, follow-up biopsy results -PT/OT: SNF on discharge; rolling walker, 3 in 1  Hypotension Multifactorial. In setting of GI bleeding, antihypertensives. Blood pressure improved. BiDil and lasix held.  Chest pain Possibly secondary esophagitis. No chest pain currently.  AKI on CKD stage IV Baseline creatinine of 2.6 with peak of 3.63. Down to baseline and is stable.  CAD Chronic combined heart failure Stable with last EF of 30-35%. Patient is s/p b/v pacemaker. -Continue metoprolol -Blood pressure somewhat soft and stable. Will hold Lasix today in setting of BP and AKI. Fluid status is normal.  COPD No wheezing. Stable.  Dementia Stable. Patient with some intermittent agitation which appears improved.   Recent Covid pneumonia  Asymptomatic.   Pressure injury Stage II, mid coccyx, not present on admission   DVT prophylaxis: SCDs Code Status:   Code Status: Partial Code Family Communication: None Disposition Plan: Discharge to SNF pending bed. Medically stable for discharge.   Consultants:   PCCM  Neurology  Procedures:   06/25/2019: Transthoracic Echocardiogram IMPRESSIONS    1. The left ventricle has moderate-severely reduced systolic function, with an ejection fraction of 30-35%. The cavity size was normal. There is abnormal septal motion consistent with left bundle branch block. Left ventricular diffuse hypokinesis.  2. The right ventricle has normal systolc function. The cavity was normal. There is no increase in right ventricular wall thickness. Right ventricular systolic pressure could not be assessed.  FINDINGS  Left Ventricle: The left ventricle has moderate-severely reduced systolic function, with an ejection fraction of 30-35%. The cavity size was normal. There is no increase in left ventricular wall thickness. There is abnormal (paradoxical) septal motion,  consistent with left bundle branch block. Left ventricular diffuse hypokinesis.    Right Ventricle: The right ventricle has normal systolic function. The cavity was normal. There is no increase in right ventricular wall thickness. Right ventricular systolic pressure could not be assessed. Pacing wire/catheter visualized in the right  ventricle.  Left Atrium: Left atrial size was normal in size.  Right Atrium: Right atrial size was normal in size. Right atrial pressure is estimated at 10 mmHg.  Interatrial Septum: No atrial level shunt detected by color flow Doppler.  Pericardium: There is no evidence of pericardial effusion.  Mitral Valve: Mitral valve regurgitation was not assessed by color flow Doppler.  Tricuspid Valve: The  tricuspid valve was normal in structure. Tricuspid valve regurgitation is trivial by color flow  Doppler.  Aortic Valve: Aortic valve regurgitation was not visualized by color flow Doppler.  Pulmonic Valve: Pulmonic valve regurgitation is not visualized by color flow Doppler.  Venous: The inferior vena cava was not well visualized. The inferior vena cava is normal in size with greater than 50% respiratory variability.   06/26/2019: EGD Impression:               - LA Grade D reflux esophagitis. Rule out Barrett's                            esophagus. Biopsied.                           - LA Grade C esophagitis.                           - Z-line, 36 cm from the incisors.                           - Medium-sized hiatal hernia.                           - Acute gastritis.                           - Mucosal changes in the duodenum. Biopsied.                           - Severe ulcerative esophagitis with area of black                            eschar consistent with source of her recent GI                            bleeding. Recommendation:           - NPO.                           - Observe patient's clinical course.                           - Await pathology results.  Antimicrobials:  None    Subjective: Had a long conversation today with Ms. Swaby. She describes how in her day they looked after their parents "until the end." She is not fond of the food here. She states she thought she was at a "rest home" and did not realize she was in the hospital. When asked about speaking with her son about her concerns about living at a "rest home" she stated she she does not want to burden him and that he should live his life and that he is a "great kid." no recurrent hemoptysis. No chest pain or dyspnea.  Objective: Vitals:   06/29/19 1728 06/29/19 2348 06/30/19 0000 06/30/19 0815  BP: 123/67 110/74  (!) 112/57  Pulse: 85 88    Resp: 17  (!) 23 17  Temp: 97.8 F (36.6 C) 98.9 F (37.2 C)  98.4  F (36.9 C)  TempSrc:  Oral  Oral  SpO2: 100% 99%  98%  Weight:      Height:         Intake/Output Summary (Last 24 hours) at 06/30/2019 1001 Last data filed at 06/30/2019 0300 Gross per 24 hour  Intake 220 ml  Output 404 ml  Net -184 ml   Filed Weights   06/24/19 2130  Weight: 56.5 kg    Examination:  General exam: Appears calm and comfortable Respiratory system: Clear to auscultation. Respiratory effort normal. Cardiovascular system: S1 & S2 heard, RRR. No murmurs, rubs, gallops or clicks. Gastrointestinal system: Abdomen is nondistended, soft and nontender. No organomegaly or masses felt. Normal bowel sounds heard. Central nervous system: Alert and oriented to person. Reoriented to place and time. No focal neurological deficits. Extremities: No edema. No calf tenderness Skin: No cyanosis. No rashes Psychiatry: Judgement and insight appear normal. Mood & affect appropriate.     Data Reviewed: I have personally reviewed following labs and imaging studies  CBC: Recent Labs  Lab 06/24/19 1348 06/25/19 0853 06/26/19 1013 06/27/19 0912 06/29/19 0951  WBC 9.2 15.3* 14.5* 11.1* 8.6  NEUTROABS 7.5  --   --   --   --   HGB 7.0* 13.3 14.0 12.9 12.4  HCT 21.4* 37.4 41.7 38.6 36.9  MCV 97.7 87.2 90.8 91.0 90.4  PLT 270 325 294 328 628   Basic Metabolic Panel: Recent Labs  Lab 06/24/19 1348 06/25/19 0531 06/26/19 1013 06/27/19 0912 06/29/19 0951  NA 143 139 146* 140 136  K 2.1* 4.6 4.6 3.7 3.5  CL 125* 106 116* 111 104  CO2 13* 19* 14* 20* 21*  GLUCOSE 63* 63* 43* 97 209*  BUN 45* 58* 42* 28* 24*  CREATININE 2.43* 3.63* 2.79* 2.44* 2.47*  CALCIUM 4.6* 8.2* 8.8* 8.5* 8.1*  MG  --  2.2  --   --   --   PHOS  --  4.5  --   --   --    GFR: Estimated Creatinine Clearance: 17.3 mL/min (A) (by C-G formula based on SCr of 2.47 mg/dL (H)). Liver Function Tests: Recent Labs  Lab 06/24/19 1348  AST 14*  ALT 9  ALKPHOS 44  BILITOT 0.7  PROT 3.1*  ALBUMIN 1.4*   Recent Labs  Lab 06/24/19 1348  LIPASE 44   No results for input(s): AMMONIA in  the last 168 hours. Coagulation Profile: Recent Labs  Lab 06/24/19 1350  INR 1.5*   Cardiac Enzymes: No results for input(s): CKTOTAL, CKMB, CKMBINDEX, TROPONINI in the last 168 hours. BNP (last 3 results) No results for input(s): PROBNP in the last 8760 hours. HbA1C: No results for input(s): HGBA1C in the last 72 hours. CBG: Recent Labs  Lab 06/24/19 1257 06/26/19 1111 06/26/19 1141 06/26/19 1649 06/27/19 0924  GLUCAP 104* 39* 126* 98 94   Lipid Profile: No results for input(s): CHOL, HDL, LDLCALC, TRIG, CHOLHDL, LDLDIRECT in the last 72 hours. Thyroid Function Tests: No results for input(s): TSH, T4TOTAL, FREET4, T3FREE, THYROIDAB in the last 72 hours. Anemia Panel: No results for input(s): VITAMINB12, FOLATE, FERRITIN, TIBC, IRON, RETICCTPCT in the last 72 hours. Sepsis Labs: Recent Labs  Lab 06/24/19 1350 06/25/19 0555  LATICACIDVEN 1.0 2.0*    Recent Results (from the past 240 hour(s))  Culture, blood (routine x 2)     Status: None   Collection Time: 06/24/19  1:39 PM   Specimen: BLOOD RIGHT ARM  Result Value Ref Range Status  Specimen Description BLOOD RIGHT ARM  Final   Special Requests   Final    BOTTLES DRAWN AEROBIC AND ANAEROBIC Blood Culture adequate volume   Culture   Final    NO GROWTH 5 DAYS Performed at [facility], [address] [place], [place] [address]    Report Status 06/29/2019 FINAL  Final  SARS Coronavirus 2 [facility] order, Performed in [facility] hospital lab) Nasopharyngeal Nasopharyngeal Swab     Status: None   Collection Time: 06/24/19  1:42 PM   Specimen: Nasopharyngeal Swab  Result Value Ref Range Status   SARS Coronavirus 2 NEGATIVE NEGATIVE Final    Comment: (NOTE) If result is NEGATIVE SARS-CoV-2 target nucleic acids are NOT DETECTED. The SARS-CoV-2 RNA is generally detectable in upper and lower  respiratory specimens during the acute phase of infection. The lowest  concentration of SARS-CoV-2 viral copies this  assay can detect is 250  copies / mL. A negative result does not preclude SARS-CoV-2 infection  and should not be used as the sole basis for treatment or other  patient management decisions.  A negative result may occur with  improper specimen collection / handling, submission of specimen other  than nasopharyngeal swab, presence of viral mutation(s) within the  areas targeted by this assay, and inadequate number of viral copies  (<250 copies / mL). A negative result must be combined with clinical  observations, patient history, and epidemiological information. If result is POSITIVE SARS-CoV-2 target nucleic acids are DETECTED. The SARS-CoV-2 RNA is generally detectable in upper and lower  respiratory specimens dur ing the acute phase of infection.  Positive  results are indicative of active infection with SARS-CoV-2.  Clinical  correlation with patient history and other diagnostic information is  necessary to determine patient infection status.  Positive results do  not rule out bacterial infection or co-infection with other viruses. If result is PRESUMPTIVE POSTIVE SARS-CoV-2 nucleic acids MAY BE PRESENT.   A presumptive positive result was obtained on the submitted specimen  and confirmed on repeat testing.  While 2019 novel coronavirus  (SARS-CoV-2) nucleic acids may be present in the submitted sample  additional confirmatory testing may be necessary for epidemiological  and / or clinical management purposes  to differentiate between  SARS-CoV-2 and other Sarbecovirus currently known to infect humans.  If clinically indicated additional testing with an alternate test  methodology [phone]) is advised. The SARS-CoV-2 RNA is generally  detectable in upper and lower respiratory sp ecimens during the acute  phase of infection. The expected result is Negative. Fact Sheet for Patients:  StrictlyIdeas.no Fact Sheet for Healthcare Providers:  BankingDealers.co.za This test is not yet approved or cleared by the [place] FDA and has been authorized for detection and/or diagnosis of SARS-CoV-2 by FDA under an Emergency Use Authorization (EUA).  This EUA will remain in effect (meaning this test can be used) for the duration of the COVID-19 declaration under Section 564(b)(1) of the Act, 21 U.S.C. section 360bbb-3(b)(1), unless the authorization is terminated or revoked sooner. Performed at [facility], [place] [address] [place], [place] [address]   Culture, blood (routine x 2)     Status: None (Preliminary result)   Collection Time: 06/25/19  5:31 AM   Specimen: BLOOD LEFT HAND  Result Value Ref Range Status   Specimen Description BLOOD LEFT HAND  Final   Special Requests   Final    BOTTLES DRAWN AEROBIC ONLY Blood Culture adequate volume   Culture  Final    NO GROWTH 4 DAYS Performed at [facility], [place] [address] [place], [place] [address]    Report Status PENDING  Incomplete         Radiology Studies: No results found.      Scheduled Meds: . atorvastatin  40 mg Oral q1800  . chlorhexidine  15 mL Mouth Rinse BID  . dextrose  1 ampule Intravenous Once  . donepezil  5 mg Oral QHS  . mouth rinse  15 mL Mouth Rinse q12n4p  . metoprolol succinate  25 mg Oral Daily  . pantoprazole  40 mg Oral BID  . sodium bicarbonate  650 mg Oral BID   Continuous Infusions: . calcium chloride    . magnesium sulfate bolus IVPB       LOS: 6 days     Cordelia Poche, MD [facility] 06/30/2019, 10:01 AM  If 7PM-7AM, please contact night-coverage [email or web address]

## 2019-06-30 NOTE — Progress Notes (Signed)
Physical Therapy Treatment Patient Details Name: ANALYSE ANGST MRN: [id number] DOB: 10-Aug-1946 Today's Date: 06/30/2019    History of Present Illness This is a 73 year old female with history of dementia, chronic combined systolic and diastolic CHF, EF of 37%, by V pacemaker, history of non-small cell lung cancer, COPD, stage IV chronic kidney disease was recently hospitalized to [facility] hospital with COVID in July, required mechanical intubation, clinically improved and discharged to [facility] rehabilitation on 8/8, this hospitalization was reportedly complicated by new onset paroxysmal A. fib, lower GI bleed, discharged on baby aspirin and metoprolol, dysphagia 3 diet.  Admitted with coffee ground emesis and low hemoglobin now s/p endoscopy positive for esophagitis.    PT Comments    Pt making steady progress.    Follow Up Recommendations  SNF;Supervision/Assistance - 24 hour     Equipment Recommendations  Rolling walker with 5" wheels;3in1 (PT)    Recommendations for Other Services       Precautions / Restrictions Precautions Precautions: Fall Precaution Comments: requires encouragement for OOB activity    Mobility  Bed Mobility Overal bed mobility: Needs Assistance Bed Mobility: Supine to Sit     Supine to sit: Min assist     General bed mobility comments: Assist to elevate trunk into sitting  Transfers Overall transfer level: Needs assistance Equipment used: Rolling walker (2 wheeled) Transfers: Sit to/from [facility] Sit to Stand: Min assist Stand pivot transfers: Min assist       General transfer comment: Assist to bring hips up and for balance  Ambulation/Gait Ambulation/Gait assistance: Min assist Gait Distance (Feet): 25 Feet Assistive device: Rolling walker (2 wheeled) Gait Pattern/deviations: Step-through pattern;Trunk flexed;Decreased stride length Gait velocity: decr Gait velocity interpretation: <1.31 ft/sec, indicative of household  ambulator General Gait Details: Assist for balance and support   Marine scientist [place] (Stroke Patients Only)       Balance Overall balance assessment: Needs assistance Sitting-balance support: No upper extremity supported Sitting balance-Leahy Scale: Good     Standing balance support: Bilateral upper extremity supported Standing balance-Leahy Scale: Poor Standing balance comment: walker and min assist for static standing                            Cognition Arousal/Alertness: Awake/alert Behavior During Therapy: WFL for tasks assessed/performed Overall Cognitive Status: No family/caregiver present to determine baseline cognitive functioning Area of Impairment: Orientation;Attention;Memory;Safety/judgement;Problem solving                 Orientation Level: Disoriented to;Time Current Attention Level: Sustained Memory: Decreased short-term memory Following Commands: Follows one step commands with increased time Safety/Judgement: Decreased awareness of safety   Problem Solving: Requires verbal cues;Requires tactile cues        Exercises      General Comments        Pertinent Vitals/Pain Pain Assessment: No/denies pain    Home Living                      Prior Function            PT Goals (current goals can now be found in the care plan section) Progress towards PT goals: Progressing toward goals    Frequency    Min 2X/week      PT Plan Current plan remains appropriate    Co-evaluation  AM-PAC PT "6 Clicks" Mobility   Outcome Measure  Help needed turning from your back to your side while in a flat bed without using bedrails?: A Little Help needed moving from lying on your back to sitting on the side of a flat bed without using bedrails?: A Little Help needed moving to and from a bed to a chair (including a wheelchair)?: A Little Help needed standing up from  a chair using your arms (e.g., wheelchair or bedside chair)?: A Little Help needed to walk in hospital room?: A Little Help needed climbing 3-5 steps with a railing? : Total 6 Click Score: 16    End of Session Equipment Utilized During Treatment: Gait belt Activity Tolerance: Patient tolerated treatment well Patient left: with call bell/phone within reach;in chair;with chair alarm set Nurse Communication: Mobility status PT Visit Diagnosis: Muscle weakness (generalized) (M62.81);Other abnormalities of gait and mobility (R26.89)     Time: 7616-0737 PT Time Calculation (min) (ACUTE ONLY): 28 min  Charges:  $Gait Training: 23-37 mins                     [place] Pager [phone] Office [place] 06/30/2019, 5:31 PM

## 2019-07-01 LAB — CBC WITH DIFFERENTIAL/PLATELET
Abs Immature Granulocytes: 0.04 10*3/uL (ref 0.00–0.07)
Basophils Absolute: 0.1 10*3/uL (ref 0.0–0.1)
Basophils Relative: 1 %
Eosinophils Absolute: 0.1 10*3/uL (ref 0.0–0.5)
Eosinophils Relative: 1 %
HCT: 39.7 % (ref 36.0–46.0)
Hemoglobin: 13.4 g/dL (ref 12.0–15.0)
Immature Granulocytes: 0 %
Lymphocytes Relative: 18 %
Lymphs Abs: 2 10*3/uL (ref 0.7–4.0)
MCH: 30.7 pg (ref 26.0–34.0)
MCHC: 33.8 g/dL (ref 30.0–36.0)
MCV: 90.8 fL (ref 80.0–100.0)
Monocytes Absolute: 0.8 10*3/uL (ref 0.1–1.0)
Monocytes Relative: 7 %
Neutro Abs: 8.1 10*3/uL — ABNORMAL HIGH (ref 1.7–7.7)
Neutrophils Relative %: 73 %
Platelets: 330 10*3/uL (ref 150–400)
RBC: 4.37 MIL/uL (ref 3.87–5.11)
RDW: 14.5 % (ref 11.5–15.5)
WBC: 11 10*3/uL — ABNORMAL HIGH (ref 4.0–10.5)
nRBC: 0 % (ref 0.0–0.2)

## 2019-07-01 LAB — COMPREHENSIVE METABOLIC PANEL
ALT: 15 U/L (ref 0–44)
AST: 18 U/L (ref 15–41)
Albumin: 2.4 g/dL — ABNORMAL LOW (ref 3.5–5.0)
Alkaline Phosphatase: 85 U/L (ref 38–126)
Anion gap: 12 (ref 5–15)
BUN: 16 mg/dL (ref 8–23)
CO2: 23 mmol/L (ref 22–32)
Calcium: 8.7 mg/dL — ABNORMAL LOW (ref 8.9–10.3)
Chloride: 107 mmol/L (ref 98–111)
Creatinine, Ser: 2.21 mg/dL — ABNORMAL HIGH (ref 0.44–1.00)
GFR calc Af Amer: 25 mL/min — ABNORMAL LOW (ref >60)
GFR calc non Af Amer: 21 mL/min — ABNORMAL LOW (ref >60)
Glucose, Bld: 99 mg/dL (ref 70–99)
Potassium: 3.6 mmol/L (ref 3.5–5.1)
Sodium: 142 mmol/L (ref 135–145)
Total Bilirubin: 0.8 mg/dL (ref 0.3–1.2)
Total Protein: 5.6 g/dL — ABNORMAL LOW (ref 6.5–8.1)

## 2019-07-01 LAB — MRSA PCR SCREENING: MRSA by PCR: POSITIVE — AB

## 2019-07-01 LAB — PHOSPHORUS: Phosphorus: 2.8 mg/dL (ref 2.5–4.6)

## 2019-07-01 LAB — MAGNESIUM: Magnesium: 1.5 mg/dL — ABNORMAL LOW (ref 1.7–2.4)

## 2019-07-01 MED ORDER — MAGNESIUM SULFATE 2 GM/50ML IV SOLN
2.0000 g | Freq: Once | INTRAVENOUS | Status: AC
  Administered 2019-07-01: 2 g via INTRAVENOUS
  Filled 2019-07-01: qty 50

## 2019-07-01 NOTE — Plan of Care (Signed)
  Problem: Education: Goal: Knowledge of General Education information will improve Description: Including pain rating scale, medication(s)/side effects and non-pharmacologic comfort measures 07/01/2019 2028 by Dedra Skeens, RN Outcome: Progressing 07/01/2019 2028 by Dedra Skeens, RN Outcome: Progressing   Problem: Health Behavior/Discharge Planning: Goal: Ability to manage health-related needs will improve 07/01/2019 2028 by Dedra Skeens, RN Outcome: Progressing 07/01/2019 2028 by Dedra Skeens, RN Outcome: Progressing   Problem: Clinical Measurements: Goal: Ability to maintain clinical measurements within normal limits will improve 07/01/2019 2028 by Dedra Skeens, RN Outcome: Progressing 07/01/2019 2028 by Dedra Skeens, RN Outcome: Progressing Goal: Will remain free from infection 07/01/2019 2028 by Dedra Skeens, RN Outcome: Progressing 07/01/2019 2028 by Dedra Skeens, RN Outcome: Progressing Goal: Diagnostic test results will improve 07/01/2019 2028 by Dedra Skeens, RN Outcome: Progressing 07/01/2019 2028 by Dedra Skeens, RN Outcome: Progressing Goal: Respiratory complications will improve 07/01/2019 2028 by Dedra Skeens, RN Outcome: Progressing 07/01/2019 2028 by Dedra Skeens, RN Outcome: Progressing Goal: Cardiovascular complication will be avoided 07/01/2019 2028 by Dedra Skeens, RN Outcome: Progressing 07/01/2019 2028 by Dedra Skeens, RN Outcome: Progressing

## 2019-07-01 NOTE — Progress Notes (Signed)
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  She then subsequently presented secondary to coffee-ground emesis from esophagitis that was found on EGD.  She has not had any recurrence of any coffee-ground emesis but was little confused today.  Awaiting SNF placement.  Assessment & Plan:   Principal Problem:   GI bleed Active Problems:   S/P lobectomy of lung   Non-small cell carcinoma of right lung, stage 1 (HCC)   CKD (chronic kidney disease), stage IV (HCC)   Chest pain   Nonischemic cardiomyopathy (HCC)   Dementia (HCC)   Pressure injury of skin   Hypotension   Electrolyte abnormality  Coffee ground emesis Severe grade D esophagitis -Patient is s/p endoscopy on 8/21 significant for severe esophagitis.  -Biopsy performed.  -She has received 2 units of PRBC to date. GI signed off. Biopsy suggests chronic duodenitis. -GI recommendations: PPI BID x3 months, follow-up biopsy results -PT/OT: SNF on discharge; rolling walker, 3 in 1 -Patient's hemoglobin/hematocrit is now stable and is 13.4/39.7 -Continue to monitor for signs and symptoms of bleeding  Hypotension -Multifactorial. In setting of GI bleeding, antihypertensives.  -Blood pressure improved.  -BiDil and lasix are continuing to be held but metoprolol succinate 25 g p.o. daily was resumed.  Chest pain -Possibly secondary esophagitis. No chest pain currently -Continue metoprolol succinate 25 mg p.o. daily  along with atorvastatin 41 p.o. daily.  AKI on CKD stage IV -Baseline creatinine of 2.6 with peak of 3.63.  -Patient's renal function is improved significantly and is now close to baseline as BUN/creatinine is now 16/2.21 next-avoid nephrotoxic medications, contrast dyes as well as hypotension -Currently her BiDil and Lasix are being held -Continue monitor and trend renal function -Repeat CMP in the a.m.  CAD Chronic combined heart failure -Stable with last EF of 30-35%. Patient is s/p b/v pacemaker. -Continue metoprolol extended 25 mg p.o. daily -Blood pressure somewhat soft and stable and last reading was 111/71.  -Will continue hold Lasix today in setting of BP and AKI.  -Fluid status is normal. -Continue atorvastatin as well  COPD -No wheezing. Stable. -Continue albuterol as above  Dementia -Stable she is intermittently confused and thought the person on television with her doctor yesterday.  -Patient with some intermittent agitation which appears improved.  -Continue with donepezil 5 mg p.o. nightly  Recent Covid pneumonia -Asymptomatic.  -Continue with albuterol 3 mils every 6 hours PRN for wheezing or shortness of breath -Continue to monitor for signs and symptoms and repeat chest x-ray in the a.m.  Pressure injury -Stage II, mid coccyx, not present on admission  Hypomagnesemia -Patient magnesium level this morning was 1.5 -Replete with IV mag sulfate 2 grams -Continue to monitor replete as necessary -Repeat Mag Level in a.m.  Hyperlipidemia -Continue with Atorvastatin 40 g p.o. nightly  Leukocytosis -Likely reactive as WBC went from 8.6 -> 11.0 -Continue to monitor for signs and symptoms of infection -Repeat CBC in a.m.  DVT prophylaxis: SCDs given Coffee Ground Emesis  Code Status: FULL CODE  Family Communication: No family present at bedside  Disposition Plan: SNF  Consultants:   Gastroenterology    Procedures:  ECHOCARDIOGRAM IMPRESSIONS    1. The left ventricle has moderate-severely reduced systolic function, with an ejection fraction of 30-35%. The cavity size was normal. There is abnormal septal motion consistent with left bundle branch block. Left ventricular diffuse hypokinesis. 2. The right ventricle has normal systolc function. The cavity was normal. There is no increase in right ventricular wall thickness. Right ventricular systolic pressure could not be assessed.  FINDINGS Left Ventricle: The left ventricle has moderate-severely reduced systolic function, with an ejection fraction of 30-35%. The cavity size was normal. There is no increase in left ventricular wall thickness. There is abnormal (paradoxical) septal motion,  consistent with left bundle branch block. Left ventricular diffuse hypokinesis.   Right Ventricle: The right ventricle has normal systolic function. The cavity was normal. There is no increase in right ventricular wall thickness. Right ventricular systolic pressure could not be assessed. Pacing wire/catheter visualized in the right  ventricle.  Left Atrium: Left atrial size was normal in size.  Right Atrium: Right atrial size was normal in size. Right atrial pressure is estimated at 10 mmHg.  Interatrial Septum: No atrial level shunt detected by color flow Doppler.  Pericardium: There is no evidence of pericardial effusion.  Mitral Valve: Mitral valve regurgitation was not assessed by color flow Doppler.  Tricuspid Valve: The tricuspid valve was normal in structure. Tricuspid valve regurgitation is trivial by color flow Doppler.  Aortic Valve: Aortic valve regurgitation was not visualized by color flow Doppler.  Pulmonic Valve: Pulmonic valve regurgitation is not visualized by color flow Doppler.  Venous: The inferior vena cava was not well visualized. The inferior vena cava is normal in size with greater than 50% respiratory variability.  06/26/2019: EGD Impression:  - LA Grade D reflux esophagitis. Rule out Barrett's  esophagus. Biopsied. - LA Grade C esophagitis. - Z-line, 36 cm from the incisors. - Medium-sized hiatal hernia. - Acute gastritis. - Mucosal changes in the duodenum. Biopsied. - Severe ulcerative esophagitis with area of black  eschar consistent with source of her recent GI  bleeding. Recommendation: - NPO. - Observe patient's clinical course. - Await pathology results.   Antimicrobials:  Anti-infectives (From admission, onward)   Start     Dose/Rate Route Frequency Ordered Stop   06/26/19 1300  vancomycin (VANCOCIN) IVPB 750 mg/150 ml premix  Status:  Discontinued     750 mg 150 mL/hr over 60 Minutes Intravenous Every 48 hours 06/24/19 1458 06/24/19 1654   06/25/19 1500  ceFEPIme (MAXIPIME) 2 g in sodium chloride 0.9 % 100 mL IVPB  Status:  Discontinued     2 g 200 mL/hr over 30 Minutes Intravenous Every 24 hours 06/24/19 1458 06/25/19 0739   06/24/19 1345  vancomycin (VANCOCIN) IVPB 1000 mg/200 mL premix     1,000 mg 200 mL/hr over 60 Minutes Intravenous  Once 06/24/19 1336 06/24/19 1605   06/24/19 1345  ceFEPIme (MAXIPIME) 2 g in sodium chloride 0.9 % 100 mL IVPB     2 g 200 mL/hr over 30 Minutes Intravenous  Once 06/24/19 1336 06/24/19 1605     Subjective: Seen and examined at bedside states that she is doing okay but was a little confused.  No nausea or vomiting.  Thought the main that she saw on TV today was her doctor yesterday.  No other concerns or complaints at this time.  Objective: Vitals:   06/30/19 0000  06/30/19 0815 06/30/19 1619 06/30/19 2319  BP:  (!) 112/57 (!) 107/56 119/62  Pulse:      Resp: (!) 23 17  18    Temp:  98.4 F (36.9 C) 98 F (36.7 C) 98.6 F (37 C)  TempSrc:  Oral Oral Oral  SpO2:  98% 98% 98%  Weight:      Height:        Intake/Output Summary (Last 24 hours) at 07/01/2019 4970 Last data filed at 06/30/2019 2123 Gross per 24 hour  Intake 623 ml  Output 0 ml  Net 623 ml   Filed Weights   06/24/19 2130  Weight: 56.5 kg   Examination: Physical Exam:  Constitutional: Thin African-American female currently in NAD and appears calm and comfortable Eyes: Lids and conjunctivae normal, sclerae anicteric  ENMT: External Ears, Nose appear normal. Grossly normal hearing.  Neck: Appears normal, supple, no cervical masses, normal ROM, no appreciable thyromegaly; no JVD Respiratory: Diminished to auscultation bilaterally, no wheezing, rales, rhonchi or crackles. Normal respiratory effort and patient is not tachypenic. No accessory muscle use.  Unlabored breathing Cardiovascular: RRR, no murmurs / rubs / gallops. S1 and S2 auscultated. No extremity edema.  Abdomen: Soft, non-tender, non-distended. No masses palpated. No appreciable hepatosplenomegaly. Bowel sounds positive x4.  GU: Deferred. Musculoskeletal: No clubbing / cyanosis of digits/nails. No joint deformity upper and lower extremities.  Skin: No rashes, lesions, ulcers on limited skin evaluation. No induration; Warm and dry.  Neurologic: CN 2-12 grossly intact with no focal deficits. Romberg sign and cerebellar reflexes not assessed.  Psychiatric: Impaired judgment and insight. Alert and oriented x 1. Normal mood and appropriate affect.   Data Reviewed: I have personally reviewed following labs and imaging studies  CBC: Recent Labs  Lab 06/24/19 1348 06/25/19 0853 06/26/19 1013 06/27/19 0912 06/29/19 0951  WBC 9.2 15.3* 14.5* 11.1* 8.6  NEUTROABS 7.5  --   --   --   --   HGB 7.0* 13.3 14.0 12.9 12.4  HCT 21.4* 37.4 41.7 38.6 36.9  MCV 97.7 87.2 90.8 91.0 90.4  PLT 270 325 294 328 263   Basic Metabolic Panel:  Recent Labs  Lab 06/24/19 1348 06/25/19 0531 06/26/19 1013 06/27/19 0912 06/29/19 0951  NA 143 139 146* 140 136  K 2.1* 4.6 4.6 3.7 3.5  CL 125* 106 116* 111 104  CO2 13* 19* 14* 20* 21*  GLUCOSE 63* 63* 43* 97 209*  BUN 45* 58* 42* 28* 24*  CREATININE 2.43* 3.63* 2.79* 2.44* 2.47*  CALCIUM 4.6* 8.2* 8.8* 8.5* 8.1*  MG  --  2.2  --   --   --   PHOS  --  4.5  --   --   --    GFR: Estimated Creatinine Clearance: 17.3 mL/min (A) (by C-G formula based on SCr of 2.47 mg/dL (H)). Liver Function Tests: Recent Labs  Lab 06/24/19 1348  AST 14*  ALT 9  ALKPHOS 44  BILITOT 0.7  PROT 3.1*  ALBUMIN 1.4*   Recent Labs  Lab 06/24/19 1348  LIPASE 44   No results for input(s): AMMONIA in the last 168 hours. Coagulation Profile: Recent Labs  Lab 06/24/19 1350  INR 1.5*   Cardiac Enzymes: No results for input(s): CKTOTAL, CKMB, CKMBINDEX, TROPONINI in the last 168 hours. BNP (last 3 results) No results for input(s): PROBNP in the last 8760 hours. HbA1C: No results for input(s): HGBA1C in the last 72 hours. CBG: Recent Labs  Lab 06/24/19 1257 06/26/19 1111 06/26/19  1141 06/26/19 1649 06/27/19 0924  GLUCAP 104* 39* 126* 98 94   Lipid Profile: No results for input(s): CHOL, HDL, LDLCALC, TRIG, CHOLHDL, LDLDIRECT in the last 72 hours. Thyroid Function Tests: No results for input(s): TSH, T4TOTAL, FREET4, T3FREE, THYROIDAB in the last 72 hours. Anemia Panel: No results for input(s): VITAMINB12, FOLATE, FERRITIN, TIBC, IRON, RETICCTPCT in the last 72 hours. Sepsis Labs: Recent Labs  Lab 06/24/19 1350 06/25/19 0555  LATICACIDVEN 1.0 2.0*    Recent Results (from the past 240 hour(s))  Culture, blood (routine x 2)     Status: None   Collection Time: 06/24/19  1:39 PM   Specimen: BLOOD RIGHT ARM  Result Value Ref Range Status   Specimen Description BLOOD RIGHT ARM  Final   Special Requests   Final    BOTTLES DRAWN AEROBIC AND ANAEROBIC Blood Culture adequate volume    Culture   Final    NO GROWTH 5 DAYS Performed at [facility], [place] [address], [place], [place] [address]    Report Status 06/29/2019 FINAL  Final  SARS Coronavirus 2 [facility] order, Performed in [facility] hospital lab) Nasopharyngeal Nasopharyngeal Swab     Status: None   Collection Time: 06/24/19  1:42 PM   Specimen: Nasopharyngeal Swab  Result Value Ref Range Status   SARS Coronavirus 2 NEGATIVE NEGATIVE Final    Comment: (NOTE) If result is NEGATIVE SARS-CoV-2 target nucleic acids are NOT DETECTED. The SARS-CoV-2 RNA is generally detectable in upper and lower  respiratory specimens during the acute phase of infection. The lowest  concentration of SARS-CoV-2 viral copies this assay can detect is 250  copies / mL. A negative result does not preclude SARS-CoV-2 infection  and should not be used as the sole basis for treatment or other  patient management decisions.  A negative result may occur with  improper specimen collection / handling, submission of specimen other  than nasopharyngeal swab, presence of viral mutation(s) within the  areas targeted by this assay, and inadequate number of viral copies  (<250 copies / mL). A negative result must be combined with clinical  observations, patient history, and epidemiological information. If result is POSITIVE SARS-CoV-2 target nucleic acids are DETECTED. The SARS-CoV-2 RNA is generally detectable in upper and lower  respiratory specimens dur ing the acute phase of infection.  Positive  results are indicative of active infection with SARS-CoV-2.  Clinical  correlation with patient history and other diagnostic information is  necessary to determine patient infection status.  Positive results do  not rule out bacterial infection or co-infection with other viruses. If result is PRESUMPTIVE POSTIVE SARS-CoV-2 nucleic acids MAY BE PRESENT.   A presumptive positive result was obtained on the submitted specimen  and confirmed on  repeat testing.  While 2019 novel coronavirus  (SARS-CoV-2) nucleic acids may be present in the submitted sample  additional confirmatory testing may be necessary for epidemiological  and / or clinical management purposes  to differentiate between  SARS-CoV-2 and other Sarbecovirus currently known to infect humans.  If clinically indicated additional testing with an alternate test  methodology [phone]) is advised. The SARS-CoV-2 RNA is generally  detectable in upper and lower respiratory sp ecimens during the acute  phase of infection. The expected result is Negative. Fact Sheet for Patients:  StrictlyIdeas.no Fact Sheet for Healthcare Providers: BankingDealers.co.za This test is not yet approved or cleared by the [place] FDA and has been authorized for detection and/or diagnosis of SARS-CoV-2 by FDA  under an Emergency Use Authorization (EUA).  This EUA will remain in effect (meaning this test can be used) for the duration of the COVID-19 declaration under Section 564(b)(1) of the Act, 21 U.S.C. section 360bbb-3(b)(1), unless the authorization is terminated or revoked sooner. Performed at [facility], [place] [address], [place], [place] [address]   Culture, blood (routine x 2)     Status: None   Collection Time: 06/25/19  5:31 AM   Specimen: BLOOD LEFT HAND  Result Value Ref Range Status   Specimen Description BLOOD LEFT HAND  Final   Special Requests   Final    BOTTLES DRAWN AEROBIC ONLY Blood Culture adequate volume   Culture   Final    NO GROWTH 5 DAYS Performed at [facility], [address] [place], [place] [address]    Report Status 06/30/2019 FINAL  Final    Radiology Studies: No results found.  Scheduled Meds: . atorvastatin  40 mg Oral q1800  . chlorhexidine  15 mL Mouth Rinse BID  . dextrose  1 ampule Intravenous Once  . donepezil  5 mg Oral QHS  . mouth rinse  15 mL Mouth Rinse q12n4p  .  metoprolol succinate  25 mg Oral Daily  . pantoprazole  40 mg Oral BID  . sodium bicarbonate  650 mg Oral BID   Continuous Infusions: . calcium chloride    . magnesium sulfate bolus IVPB      LOS: 7 days   Kerney Elbe, DO [facility] PAGER is on [place]  If 7PM-7AM, please contact night-coverage [email or web address] Password [email or web address] 07/01/2019, 8:08 AM

## 2019-07-01 NOTE — Progress Notes (Signed)
Occupational Therapy Treatment Patient Details Name: Betty [name] MRN: [id number] DOB: [birth year] Today's Date: 07/01/2019    History of present illness This is a 73 year old female with history of dementia, chronic combined systolic and diastolic CHF, EF of 54%, by V pacemaker, history of non-small cell lung cancer, COPD, stage IV chronic kidney disease was recently hospitalized to [facility] hospital with COVID in July, required mechanical intubation, clinically improved and discharged to [facility] rehabilitation on 8/8, this hospitalization was reportedly complicated by new onset paroxysmal A. fib, lower GI bleed, discharged on baby aspirin and metoprolol, dysphagia 3 diet.  Admitted with coffee ground emesis and low hemoglobin now s/p endoscopy positive for esophagitis.   OT comments  Pt progressing towards acute OT goals. Pt spoke often of her desire to keep her independence as long as she can and to be back home. Motivated to work with therapy. Unsure what amount of assist she has available at home. D/c plan remains appropriate.    Follow Up Recommendations  SNF;Supervision/Assistance - 24 hour    Equipment Recommendations  (TBD in next venue)    Recommendations for Other Services      Precautions / Restrictions Precautions Precautions: Fall Precaution Comments: requires encouragement for OOB activity Restrictions Weight Bearing Restrictions: No       Mobility Bed Mobility Overal bed mobility: Needs Assistance Bed Mobility: Supine to Sit     Supine to sit: Min guard     General bed mobility comments: min guard for safety. HOB partially elevated  Transfers Overall transfer level: Needs assistance Equipment used: Rolling walker (2 wheeled) Transfers: Sit to/from Stand Sit to Stand: Min guard;Min assist         General transfer comment: steadying assist, assist for safety. to/from EOB and recliner    Balance Overall balance assessment: Needs assistance Sitting-balance  support: No upper extremity supported Sitting balance-Leahy Scale: Good     Standing balance support: Bilateral upper extremity supported Standing balance-Leahy Scale: Poor Standing balance comment: reliant on rw                           ADL either performed or assessed with clinical judgement   ADL                           Toilet Transfer: Min guard;Minimal assistance;Ambulation;RW Toilet Transfer Details (indicate cue type and reason): simulated during session         Functional mobility during ADLs: Min guard;Minimal assistance;Rolling walker       Vision       Perception     Praxis      Cognition Arousal/Alertness: Awake/alert Behavior During Therapy: WFL for tasks assessed/performed Overall Cognitive Status: History of cognitive impairments - at baseline                                 General Comments: She would refer to her son Betty [name] throughout the session -- she truly cares for him. Verbal perseveration regarding wanting to be able to do for herself.        Exercises     Shoulder Instructions       General Comments      Pertinent Vitals/ Pain       Pain Assessment: Faces Faces Pain Scale: Hurts little more Pain Location: general discomfort Pain Descriptors / Indicators: Aching Pain Intervention(s):  Monitored during session  Home Living                                          Prior Functioning/Environment              Frequency  Min 2X/week        Progress Toward Goals  OT Goals(current goals can now be found in the care plan section)  Progress towards OT goals: Progressing toward goals  Acute Rehab OT Goals Patient Stated Goal: spoke throughout session about desire to be independent and return home OT Goal Formulation: With patient Time For Goal Achievement: 07/20/19 Potential to Achieve Goals: Fair ADL Goals Pt Will Perform Grooming: with min guard assist;standing Pt  Will Perform Upper Body Bathing: with min assist;sitting Pt Will Perform Upper Body Dressing: with supervision;with set-up;sitting Pt Will Perform Lower Body Dressing: with min guard assist;sit to/from stand Pt Will Transfer to Toilet: with min guard assist;ambulating;bedside commode Pt Will Perform Toileting - Clothing Manipulation and hygiene: with min assist;sit to/from stand Pt/caregiver will Perform Home Exercise Program: Increased ROM;Increased strength;Both right and left upper extremity;With minimal assist;With written HEP provided Additional ADL Goal #1: Pt will perform bed mobility with supervision in preparation for ADL. Additional ADL Goal #2: Pt will sustain attention to functional task >5 min without cues.  Plan Discharge plan remains appropriate    Co-evaluation                 AM-PAC OT "6 Clicks" Daily Activity     Outcome Measure   Help from another person eating meals?: A Little Help from another person taking care of personal grooming?: A Little Help from another person toileting, which includes using toliet, bedpan, or urinal?: A Little Help from another person bathing (including washing, rinsing, drying)?: A Lot Help from another person to put on and taking off regular upper body clothing?: A Little Help from another person to put on and taking off regular lower body clothing?: A Lot 6 Click Score: 16    End of Session Equipment Utilized During Treatment: Rolling walker  OT Visit Diagnosis: Muscle weakness (generalized) (M62.81);Other symptoms and signs involving cognitive function;Unsteadiness on feet (R26.81)   Activity Tolerance Patient tolerated treatment well   Patient Left in chair;with call bell/phone within [name];with chair alarm set   Nurse Communication          Time: 0211-1735 OT Time Calculation (min): 25 min  Charges: OT General Charges $OT Visit: 1 Visit OT Treatments $Self Care/Home Management : 23-37 mins  Tyrone Schimke,  OT Acute Rehabilitation Services Pager: [phone] Office: [phone]    Hortencia Pilar 07/01/2019, 1:24 PM

## 2019-07-02 LAB — CBC WITH DIFFERENTIAL/PLATELET
Abs Immature Granulocytes: 0.04 10*3/uL (ref 0.00–0.07)
Basophils Absolute: 0.1 10*3/uL (ref 0.0–0.1)
Basophils Relative: 1 %
Eosinophils Absolute: 0.1 10*3/uL (ref 0.0–0.5)
Eosinophils Relative: 1 %
HCT: 38 % (ref 36.0–46.0)
Hemoglobin: 12.6 g/dL (ref 12.0–15.0)
Immature Granulocytes: 0 %
Lymphocytes Relative: 22 %
Lymphs Abs: 2.1 10*3/uL (ref 0.7–4.0)
MCH: 30.4 pg (ref 26.0–34.0)
MCHC: 33.2 g/dL (ref 30.0–36.0)
MCV: 91.6 fL (ref 80.0–100.0)
Monocytes Absolute: 0.8 10*3/uL (ref 0.1–1.0)
Monocytes Relative: 8 %
Neutro Abs: 6.7 10*3/uL (ref 1.7–7.7)
Neutrophils Relative %: 68 %
Platelets: 333 10*3/uL (ref 150–400)
RBC: 4.15 MIL/uL (ref 3.87–5.11)
RDW: 14.5 % (ref 11.5–15.5)
WBC: 9.8 10*3/uL (ref 4.0–10.5)
nRBC: 0 % (ref 0.0–0.2)

## 2019-07-02 LAB — PHOSPHORUS: Phosphorus: 3 mg/dL (ref 2.5–4.6)

## 2019-07-02 LAB — SARS CORONAVIRUS 2 (TAT 6-24 HRS): SARS Coronavirus 2: NEGATIVE

## 2019-07-02 LAB — BASIC METABOLIC PANEL
Anion gap: 11 (ref 5–15)
BUN: 16 mg/dL (ref 8–23)
CO2: 21 mmol/L — ABNORMAL LOW (ref 22–32)
Calcium: 8.5 mg/dL — ABNORMAL LOW (ref 8.9–10.3)
Chloride: 107 mmol/L (ref 98–111)
Creatinine, Ser: 2.16 mg/dL — ABNORMAL HIGH (ref 0.44–1.00)
GFR calc Af Amer: 25 mL/min — ABNORMAL LOW (ref >60)
GFR calc non Af Amer: 22 mL/min — ABNORMAL LOW (ref >60)
Glucose, Bld: 88 mg/dL (ref 70–99)
Potassium: 3.6 mmol/L (ref 3.5–5.1)
Sodium: 139 mmol/L (ref 135–145)

## 2019-07-02 LAB — MAGNESIUM: Magnesium: 1.8 mg/dL (ref 1.7–2.4)

## 2019-07-02 MED ORDER — PANTOPRAZOLE SODIUM 40 MG PO TBEC: 40.0000 mg | DELAYED_RELEASE_TABLET | Freq: Two times a day (BID) | ORAL | 0 refills | Status: DC

## 2019-07-02 MED ORDER — HYDROCODONE-ACETAMINOPHEN 5-325 MG PO TABS: 1.0000 | ORAL_TABLET | ORAL | 0 refills | Status: DC | PRN

## 2019-07-02 MED ORDER — SODIUM BICARBONATE 650 MG PO TABS: 650.0000 mg | ORAL_TABLET | Freq: Two times a day (BID) | ORAL | 0 refills | Status: DC

## 2019-07-02 MED ORDER — ONDANSETRON HCL 4 MG PO TABS: 4.0000 mg | ORAL_TABLET | Freq: Four times a day (QID) | ORAL | 0 refills | Status: DC | PRN

## 2019-07-02 NOTE — Discharge Summary (Addendum)
Physician Discharge Summary  Betty [name] MGQ:[id number] DOB: 09/06/1946 DOA: 06/24/2019  PCP: Josetta Huddle, MD  Admit date: 06/24/2019 Discharge date: 07/02/2019  Admitted From: SNF Disposition: SNF  Recommendations for Outpatient Follow-up:  1. Follow up with PCP in 1-2 weeks 2. Continue PPI p.o. twice daily for 3 months and have GI follow-up with biopsy results as an outpatient; follow-up with Gastroenterology as needed within 4-8 weeks 3. Please obtain CMP/CBC, Mag, Phos in one week 4. 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  She then subsequently presented secondary to coffee-ground emesis from esophagitis that was found on EGD.  She has not had any recurrence of any coffee-ground emesis but was a little confused yesterday but more alert today.  She is stable from medical perspective for discharge to skilled nursing facility and has a bed availability today.  ADDENDUM 07/03/2019: The patient unfortunately did not leave yesterday due to COVID testing and result was still pending at the time of discharge.  Today it is back and she is negative.  She was seen this morning and had no acute changes overnight she still stable for discharge.  I spoke with the nurse and the nurse said that there are no acute changes overnight.  We will discontinue telemetry as she is  being discharged.  She will continue on her PPI for 3 months and will need to follow-up with Gastroenterology in outpatient setting as well.  Discharge Diagnoses:  Principal Problem:   GI bleed Active Problems:   S/P lobectomy of lung   Non-small cell carcinoma of right lung, stage 1 (HCC)   CKD (chronic kidney disease), stage IV (HCC)   Chest pain   Nonischemic cardiomyopathy (HCC)   Dementia (HCC)   Pressure injury of skin   Hypotension   Electrolyte abnormality   Coffee Ground Emesis, improved Severe grade D esophagitis -Patient is s/p endoscopy on 8/21 significant for severe esophagitis.  -Biopsy performed.  -She has received 2 units of PRBC to date. GI signed off.Biopsy suggests chronic duodenitis. -GI recommendations: PPI BID x3 months, follow-up biopsy results -PT/OT: SNF on discharge; rolling walker, 3 in 1 -Patient's hemoglobin/hematocrit is now stable and is 12.6/38.0 -Continue to monitor for signs and symptoms of bleeding  Hypotension, improved and BP now stabled -Multifactorial. In setting of GI bleeding, antihypertensives.  -Blood pressure improved.  -BiDil and Lasix  were held while she was hospitalized but metoprolol succinate 25 g p.o. daily was resumed.  Okay to resume BiDil and Lasix at discharge  Chest Pain, improved  -Possibly secondary esophagitis. No chest pain currently -Continue metoprolol succinate 25 mg p.o. daily along with atorvastatin 41 p.o. daily. -Follow-up with cardiology in the outpatient setting  AKI on CKD stage IV Metabolic Acidosis -Baseline creatinine of 2.6 with peak of 3.63.  -Patient's renal function is improved significantly and is now close to baseline as BUN/creatinine is now 16/2.16 -Patient CO2 was 21 and anion gap is  11 -Avoid nephrotoxic medications, contrast dyes as well as hypotension -Currently her BiDil and Lasix are being held -Continue monitor and trend renal function -Repeat CMP in outpatient setting with  SNF  CAD Chronic combined heart failure -Stable with last EF of 30-35%. Patient is s/p b/v pacemaker. -Continue metoprolol extended 25 mg p.o. daily -Blood pressure somewhat soft and stable and last reading was 115/66.  -Held Lasix and BiDil in the setting of AKI and low blood pressure but this is improved and can be resumed at discharge -I spoke with gastroenterology Dr. Laurence Spates who recommends resuming aspirin 81 mg at discharge as she is on Protonix 40 mg p.o. twice daily -Fluid status is normal. -Continue atorvastatin as well  COPD -No wheezing. Stable. -Continue albuterol as above  Dementia -She is intermittently confused and thought the person on television with her doctor yesterday.  -Patient with some intermittent agitation which appears improved.  -Continue with Donepezil 5 mg p.o. nightly -Continue delirium precautions  Recent Covid pneumonia -Asymptomatic.  -Continue with albuterol 3 mils every 6 hours PRN for wheezing or shortness of breath -Continue to monitor for signs and symptoms and repeat chest x-ray in the a.m. -Repeat COVID test per nursing facility protocol is pending  Pressure injury -Stage II, mid coccyx, not present on admission  Hypomagnesemia -Patient magnesium level is improved to 1.8 -Replete with IV mag sulfate 2 grams yesterday -Continue to monitor replete as necessary -Repeat Mag Level in a.m.  Hyperlipidemia -Continue with Atorvastatin 40 g p.o. nightly  Leukocytosis -Likely reactive as WBC went from 8.6 -> 11.0 -> 9.8 -Continue to monitor for signs and symptoms of infection -Repeat CBC in a.m.  Discharge Instructions  Discharge Instructions    Call MD for:  difficulty breathing, headache or visual disturbances   Complete by: As directed    Call MD for:  extreme fatigue   Complete by: As directed    Call MD for:  hives   Complete by: As directed    Call MD for:  persistant dizziness or light-headedness   Complete by:  As directed    Call MD for:  persistant nausea and vomiting   Complete by: As directed    Call MD for:  redness, tenderness, or signs of infection (pain, swelling, redness, odor or green/yellow discharge around incision site)   Complete by: As directed    Call MD for:  severe uncontrolled pain   Complete by: As directed    Call MD for:  temperature >100.4   Complete by: As directed    Diet - low sodium heart healthy   Complete by: As directed    Discharge instructions   Complete by: As directed    You were cared for by a hospitalist during your hospital stay. If you have any questions about your discharge medications or the care you received while you were in the hospital after you are discharged, you can call the unit and ask to speak with the hospitalist on call if the hospitalist that took care of you is not available. Once you are discharged, your primary care physician will handle any further medical issues. Please note that NO REFILLS for any discharge medications will be authorized once you are discharged, as it is imperative that you return to your primary care physician (or establish a relationship with a primary care physician if you do not have one) for your aftercare needs so that they can reassess your need for medications and monitor your lab values.  Follow up with PCP, Gastroenterology, and Cardiology in the outpatient setting. Take all medications as prescribed. If symptoms change or worsen please return to the ED for evaluation   Increase activity slowly   Complete by: As directed      Allergies as of 07/02/2019      Reactions   Sulfa Antibiotics Itching      Medication List    TAKE these medications   acetaminophen 325 MG tablet Commonly known as: TYLENOL Take 650 mg by mouth every 6 (six) hours as needed for mild pain or moderate pain. What changed: Another medication with the same name was removed. Continue taking this medication, and follow the directions you see  here.   albuterol 108 (90 Base) MCG/ACT inhaler Commonly known as: VENTOLIN HFA Inhale 2 puffs into the lungs every 6 (six) hours as needed for wheezing or shortness of breath.   aspirin EC 81 MG tablet Take 81 mg by mouth at bedtime.   atorvastatin 40 MG tablet Commonly known as: LIPITOR TAKE 1 TABLET BY MOUTH ONCE DAILY AT 6 PM What changed: See the new instructions.   donepezil 10 MG tablet Commonly known as: ARICEPT Take 1 tablet (10 mg total) by mouth at bedtime.   febuxostat 40 MG tablet Commonly known as: ULORIC Take 40 mg by mouth at bedtime.   furosemide 40 MG tablet Commonly known as: LASIX Take 20 mg by mouth daily.   HYDROcodone-acetaminophen 5-325 MG tablet Commonly known as: NORCO/VICODIN Take 1-2 tablets by mouth every 4 (four) hours as needed for moderate pain.   hydrOXYzine 10 MG tablet Commonly known as: ATARAX/VISTARIL Take 10 mg by mouth 2 (two) times daily. Anxiety   isosorbide-hydrALAZINE 20-37.5 MG tablet Commonly known as: BIDIL Take 1 tablet by mouth 2 (two) times daily.   metoprolol succinate 100 MG 24 hr tablet Commonly known as: TOPROL-XL Take 1 tablet (100 mg total) by mouth daily. Pt must keep upcoming appt in August for further refills. Thanks   multivitamin with minerals Tabs tablet Take 0.5 tablets by mouth at bedtime.   nitroGLYCERIN 0.4 MG SL tablet Commonly known as: NITROSTAT Place 1 tablet (0.4 mg total) under the tongue every 5 (five) minutes x 3 doses as needed for chest pain.   ondansetron 4 MG tablet Commonly known as: ZOFRAN Take 1 tablet (4 mg total) by mouth every 6 (six) hours as needed for nausea.   pantoprazole 40 MG tablet Commonly known as: PROTONIX Take 1 tablet (40 mg total) by mouth 2 (two) times daily.   sodium bicarbonate 650 MG tablet Take 1 tablet (650 mg total) by mouth 2 (two) times daily.   Vitamin D 50 MCG (2000 UT) tablet Take 2,000 Units by mouth at bedtime.       Allergies  Allergen  Reactions  . Sulfa Antibiotics Itching   Consultations:  Gastroenterology   Procedures/Studies: Dg Chest Port 1 View  Result Date: 06/24/2019 CLINICAL DATA:  73 year old female with chest pain. EXAM: PORTABLE CHEST 1 VIEW COMPARISON:  Chest radiograph dated 05/28/2019 FINDINGS: There is overall better aeration the lungs with interval improvement of the left lung base densities since the prior radiograph. Some residual density at the left lung base may represent chronic changes or infiltrate. Minimal right lung base atelectatic changes noted. No lobar consolidation, pleural effusion, or pneumothorax. Stable cardiomegaly. Left pectoral pacemaker device. No acute osseous pathology. IMPRESSION: 1. No acute cardiopulmonary process. 2. Improved aeration of the left lung base since the prior radiograph and  improvement in the left lung base density. Electronically Signed   By: Anner Crete M.D.   On: 06/24/2019 13:31    ECHOCARDIOGRAM IMPRESSIONS   1. The left ventricle has moderate-severely reduced systolic function, with an ejection fraction of 30-35%. The cavity size was normal. There is abnormal septal motion consistent with left bundle branch block. Left ventricular diffuse hypokinesis. 2. The right ventricle has normal systolc function. The cavity was normal. There is no increase in right ventricular wall thickness. Right ventricular systolic pressure could not be assessed.  FINDINGS Left Ventricle: The left ventricle has moderate-severely reduced systolic function, with an ejection fraction of 30-35%. The cavity size was normal. There is no increase in left ventricular wall thickness. There is abnormal (paradoxical) septal motion,  consistent with left bundle branch block. Left ventricular diffuse hypokinesis.   Right Ventricle: The right ventricle has normal systolic function. The cavity was normal. There is no increase in right ventricular wall thickness. Right ventricular  systolic pressure could not be assessed. Pacing wire/catheter visualized in the right  ventricle.  Left Atrium: Left atrial size was normal in size.  Right Atrium: Right atrial size was normal in size. Right atrial pressure is estimated at 10 mmHg.  Interatrial Septum: No atrial level shunt detected by color flow Doppler.  Pericardium: There is no evidence of pericardial effusion.  Mitral Valve: Mitral valve regurgitation was not assessed by color flow Doppler.  Tricuspid Valve: The tricuspid valve was normal in structure. Tricuspid valve regurgitation is trivial by color flow Doppler.  Aortic Valve: Aortic valve regurgitation was not visualized by color flow Doppler.  Pulmonic Valve: Pulmonic valve regurgitation is not visualized by color flow Doppler.  Venous: The inferior vena cava was not well visualized. The inferior vena cava is normal in size with greater than 50% respiratory variability.  06/26/2019: EGD   LA Grade D (one or more mucosal breaks involving at least 75% of       esophageal circumference) esophagitis with bleeding was found in the       distal esophagus. Biopsies were taken with a cold forceps for histology.       Estimated blood loss was minimal.      LA Grade C (one or more mucosal breaks continuous between tops of 2 or       more mucosal folds, less than 75% circumference) esophagitis with no       bleeding was found in the mid esophagus.      The Z-line was found 36 cm from the incisors.      A medium-sized hiatal hernia was present.      Segmental mild inflammation characterized by congestion (edema) and       erythema was found in the gastric antrum and in the prepyloric region of       the stomach.      Segmental moderate mucosal changes characterized by congestion, erythema       and nodularity were found in the duodenal bulb. Biopsies were taken with       a cold forceps for histology. Estimated blood loss was minimal.      The exam of the  duodenum was otherwise normal.      Black eschar seen in distal esophagus surrounded by ulcerated mucosa. Impression:               - LA Grade D reflux esophagitis. Rule out Barrett's  esophagus. Biopsied.                           - LA Grade C esophagitis.                           - Z-line, 36 cm from the incisors.                           - Medium-sized hiatal hernia.                           - Acute gastritis.                           - Mucosal changes in the duodenum. Biopsied.                           - Severe ulcerative esophagitis with area of black                            eschar consistent with source of her recent GI                            bleeding.  Subjective: Patient was seen and examined at bedside she was doing well.  Had no more hematemesis or coffee-ground emesis.  Denies chest pain, and is dizziness.  No nausea or vomiting.  Felt well and ready to go back to rehab.  No other concerns or complaints at this time.  Discharge Exam: Vitals:   07/01/19 2324 07/02/19 0802  BP: 127/66 115/66  Pulse: 89 83  Resp: 20   Temp: 98.2 F (36.8 C) 98.4 F (36.9 C)  SpO2: 100% 92%   Vitals:   07/01/19 0840 07/01/19 1210 07/01/19 2324 07/02/19 0802  BP:  111/71 127/66 115/66  Pulse:  83 89 83  Resp: 15 18 20    Temp:  (!) 97.5 F (36.4 C) 98.2 F (36.8 C) 98.4 F (36.9 C)  TempSrc:  Oral  Oral  SpO2:  99% 100% 92%  Weight:      Height:       General: Pt is alert, awake, not in acute distress Cardiovascular: RRR, S1/S2 +, no rubs, no gallops Respiratory: CTA bilaterally, no wheezing, no rhonchi Abdominal: Soft, NT, ND, bowel sounds + Extremities: no edema, no cyanosis  The results of significant diagnostics from this hospitalization (including imaging, microbiology, ancillary and laboratory) are listed below for reference.    Microbiology: Recent Results (from the past 240 hour(s))  Culture, blood (routine x 2)     Status: None    Collection Time: 06/24/19  1:39 PM   Specimen: BLOOD RIGHT ARM  Result Value Ref Range Status   Specimen Description BLOOD RIGHT ARM  Final   Special Requests   Final    BOTTLES DRAWN AEROBIC AND ANAEROBIC Blood Culture adequate volume   Culture   Final    NO GROWTH 5 DAYS Performed at [facility], [address], [place], [place] [address]    Report Status 06/29/2019 FINAL  Final  SARS Coronavirus 2 [facility] order, Performed in [facility] hospital lab) Nasopharyngeal Nasopharyngeal Swab     Status: None  Collection Time: 06/24/19  1:42 PM   Specimen: Nasopharyngeal Swab  Result Value Ref Range Status   SARS Coronavirus 2 NEGATIVE NEGATIVE Final    Comment: (NOTE) If result is NEGATIVE SARS-CoV-2 target nucleic acids are NOT DETECTED. The SARS-CoV-2 RNA is generally detectable in upper and lower  respiratory specimens during the acute phase of infection. The lowest  concentration of SARS-CoV-2 viral copies this assay can detect is 250  copies / mL. A negative result does not preclude SARS-CoV-2 infection  and should not be used as the sole basis for treatment or other  patient management decisions.  A negative result may occur with  improper specimen collection / handling, submission of specimen other  than nasopharyngeal swab, presence of viral mutation(s) within the  areas targeted by this assay, and inadequate number of viral copies  (<250 copies / mL). A negative result must be combined with clinical  observations, patient history, and epidemiological information. If result is POSITIVE SARS-CoV-2 target nucleic acids are DETECTED. The SARS-CoV-2 RNA is generally detectable in upper and lower  respiratory specimens dur ing the acute phase of infection.  Positive  results are indicative of active infection with SARS-CoV-2.  Clinical  correlation with patient history and other diagnostic information is  necessary to determine patient infection status.  Positive results  do  not rule out bacterial infection or co-infection with other viruses. If result is PRESUMPTIVE POSTIVE SARS-CoV-2 nucleic acids MAY BE PRESENT.   A presumptive positive result was obtained on the submitted specimen  and confirmed on repeat testing.  While 2019 novel coronavirus  (SARS-CoV-2) nucleic acids may be present in the submitted sample  additional confirmatory testing may be necessary for epidemiological  and / or clinical management purposes  to differentiate between  SARS-CoV-2 and other Sarbecovirus currently known to infect humans.  If clinically indicated additional testing with an alternate test  methodology [phone]) is advised. The SARS-CoV-2 RNA is generally  detectable in upper and lower respiratory sp ecimens during the acute  phase of infection. The expected result is Negative. Fact Sheet for Patients:  StrictlyIdeas.no Fact Sheet for Healthcare Providers: BankingDealers.co.za This test is not yet approved or cleared by the [place] FDA and has been authorized for detection and/or diagnosis of SARS-CoV-2 by FDA under an Emergency Use Authorization (EUA).  This EUA will remain in effect (meaning this test can be used) for the duration of the COVID-19 declaration under Section 564(b)(1) of the Act, 21 U.S.C. section 360bbb-3(b)(1), unless the authorization is terminated or revoked sooner. Performed at [facility], [place] [address] [place], [place] [address]   Culture, blood (routine x 2)     Status: None   Collection Time: 06/25/19  5:31 AM   Specimen: BLOOD LEFT HAND  Result Value Ref Range Status   Specimen Description BLOOD LEFT HAND  Final   Special Requests   Final    BOTTLES DRAWN AEROBIC ONLY Blood Culture adequate volume   Culture   Final    NO GROWTH 5 DAYS Performed at [facility], [address] [place],  [address]    Report Status 06/30/2019 FINAL  Final  MRSA PCR  Screening     Status: Abnormal   Collection Time: 07/01/19  1:01 PM   Specimen: Nasal Mucosa; Nasopharyngeal  Result Value Ref Range Status   MRSA by PCR POSITIVE (A) NEGATIVE Final    Comment:        The GeneXpert MRSA Assay (FDA  approved for NASAL specimens only), is one component of a comprehensive MRSA colonization surveillance program. It is not intended to diagnose MRSA infection nor to guide or monitor treatment for MRSA infections. RESULT CALLED TO, READ BACK BY AND VERIFIED WITH: Brennan Bailey RN 15:30 07/01/19 ([email or web address]) Performed at [facility], [place] [address] [place], [place] [address]    Labs: BNP (last 3 results) Recent Labs    06/07/19 0325 06/08/19 0320 06/09/19 0355  BNP 152.7* 136.4* 24.2   Basic Metabolic Panel: Recent Labs  Lab 06/26/19 1013 06/27/19 0912 06/29/19 0951 07/01/19 0842 07/02/19 0757  NA 146* 140 136 142 139  K 4.6 3.7 3.5 3.6 3.6  CL 116* 111 104 107 107  CO2 14* 20* 21* 23 21*  GLUCOSE 43* 97 209* 99 88  BUN 42* 28* 24* 16 16  CREATININE 2.79* 2.44* 2.47* 2.21* 2.16*  CALCIUM 8.8* 8.5* 8.1* 8.7* 8.5*  MG  --   --   --  1.5* 1.8  PHOS  --   --   --  2.8 3.0   Liver Function Tests: Recent Labs  Lab 07/01/19 0842  AST 18  ALT 15  ALKPHOS 85  BILITOT 0.8  PROT 5.6*  ALBUMIN 2.4*   No results for input(s): LIPASE, AMYLASE in the last 168 hours. No results for input(s): AMMONIA in the last 168 hours. CBC: Recent Labs  Lab 06/26/19 1013 06/27/19 0912 06/29/19 0951 07/01/19 0842 07/02/19 0757  WBC 14.5* 11.1* 8.6 11.0* 9.8  NEUTROABS  --   --   --  8.1* 6.7  HGB 14.0 12.9 12.4 13.4 12.6  HCT 41.7 38.6 36.9 39.7 38.0  MCV 90.8 91.0 90.4 90.8 91.6  PLT 294 328 312 330 333   Cardiac Enzymes: No results for input(s): CKTOTAL, CKMB, CKMBINDEX, TROPONINI in the last 168 hours. BNP: Invalid input(s): POCBNP CBG: Recent Labs  Lab 06/26/19 1111 06/26/19 1141 06/26/19 1649 06/27/19 0924  GLUCAP 39* 126* 98 94    D-Dimer No results for input(s): DDIMER in the last 72 hours. Hgb A1c No results for input(s): HGBA1C in the last 72 hours. Lipid Profile No results for input(s): CHOL, HDL, LDLCALC, TRIG, CHOLHDL, LDLDIRECT in the last 72 hours. Thyroid function studies No results for input(s): TSH, T4TOTAL, T3FREE, THYROIDAB in the last 72 hours.  Invalid input(s): FREET3 Anemia work up No results for input(s): VITAMINB12, FOLATE, FERRITIN, TIBC, IRON, RETICCTPCT in the last 72 hours. Urinalysis    Component Value Date/Time   COLORURINE YELLOW 05/21/2019 1541   APPEARANCEUR CLEAR 05/21/2019 1541   LABSPEC 1.012 05/21/2019 1541   PHURINE 5.0 05/21/2019 1541   GLUCOSEU NEGATIVE 05/21/2019 1541   HGBUR MODERATE (A) 05/21/2019 1541   BILIRUBINUR NEGATIVE 05/21/2019 1541   KETONESUR NEGATIVE 05/21/2019 1541   PROTEINUR NEGATIVE 05/21/2019 1541   UROBILINOGEN 0.2 04/27/2015 0834   NITRITE NEGATIVE 05/21/2019 1541   LEUKOCYTESUR NEGATIVE 05/21/2019 1541   Sepsis Labs Invalid input(s): PROCALCITONIN,  WBC,  LACTICIDVEN Microbiology Recent Results (from the past 240 hour(s))  Culture, blood (routine x 2)     Status: None   Collection Time: 06/24/19  1:39 PM   Specimen: BLOOD RIGHT ARM  Result Value Ref Range Status   Specimen Description BLOOD RIGHT ARM  Final   Special Requests   Final    BOTTLES DRAWN AEROBIC AND ANAEROBIC Blood Culture adequate volume   Culture   Final    NO GROWTH 5 DAYS Performed at [facility], [address] [place], [place] [address]    Report Status 06/29/2019 FINAL  Final  SARS Coronavirus 2 [facility] order, Performed in [facility] hospital lab) Nasopharyngeal Nasopharyngeal Swab     Status: None   Collection Time: 06/24/19  1:42 PM   Specimen: Nasopharyngeal Swab  Result Value Ref Range Status   SARS Coronavirus 2 NEGATIVE NEGATIVE Final    Comment: (NOTE) If result is NEGATIVE SARS-CoV-2 target nucleic acids are NOT DETECTED. The SARS-CoV-2 RNA is  generally detectable in upper and lower  respiratory specimens during the acute phase of infection. The lowest  concentration of SARS-CoV-2 viral copies this assay can detect is 250  copies / mL. A negative result does not preclude SARS-CoV-2 infection  and should not be used as the sole basis for treatment or other  patient management decisions.  A negative result may occur with  improper specimen collection / handling, submission of specimen other  than nasopharyngeal swab, presence of viral mutation(s) within the  areas targeted by this assay, and inadequate number of viral copies  (<250 copies / mL). A negative result must be combined with clinical  observations, patient history, and epidemiological information. If result is POSITIVE SARS-CoV-2 target nucleic acids are DETECTED. The SARS-CoV-2 RNA is generally detectable in upper and lower  respiratory specimens dur ing the acute phase of infection.  Positive  results are indicative of active infection with SARS-CoV-2.  Clinical  correlation with patient history and other diagnostic information is  necessary to determine patient infection status.  Positive results do  not rule out bacterial infection or co-infection with other viruses. If result is PRESUMPTIVE POSTIVE SARS-CoV-2 nucleic acids MAY BE PRESENT.   A presumptive positive result was obtained on the submitted specimen  and confirmed on repeat testing.  While 2019 novel coronavirus  (SARS-CoV-2) nucleic acids may be present in the submitted sample  additional confirmatory testing may be necessary for epidemiological  and / or clinical management purposes  to differentiate between  SARS-CoV-2 and other Sarbecovirus currently known to infect humans.  If clinically indicated additional testing with an alternate test  methodology [id number]) is advised. The SARS-CoV-2 RNA is generally  detectable in upper and lower respiratory sp ecimens during the acute  phase of  infection. The expected result is Negative. Fact Sheet for Patients:  StrictlyIdeas.no Fact Sheet for Healthcare Providers: BankingDealers.co.za This test is not yet approved or cleared by the [place] FDA and has been authorized for detection and/or diagnosis of SARS-CoV-2 by FDA under an Emergency Use Authorization (EUA).  This EUA will remain in effect (meaning this test can be used) for the duration of the COVID-19 declaration under Section 564(b)(1) of the Act, 21 U.S.C. section 360bbb-3(b)(1), unless the authorization is terminated or revoked sooner. Performed at [facility], [place] [address], [place], [place] [address]   Culture, blood (routine x 2)     Status: None   Collection Time: 06/25/19  5:31 AM   Specimen: BLOOD LEFT HAND  Result Value Ref Range Status   Specimen Description BLOOD LEFT HAND  Final   Special Requests   Final    BOTTLES DRAWN AEROBIC ONLY Blood Culture adequate volume   Culture   Final    NO GROWTH 5 DAYS Performed at [facility], [address] [place], [place] [address]    Report Status 06/30/2019 FINAL  Final  MRSA PCR Screening     Status: Abnormal   Collection Time: 07/01/19  1:01  PM   Specimen: Nasal Mucosa; Nasopharyngeal  Result Value Ref Range Status   MRSA by PCR POSITIVE (A) NEGATIVE Final    Comment:        The GeneXpert MRSA Assay (FDA approved for NASAL specimens only), is one component of a comprehensive MRSA colonization surveillance program. It is not intended to diagnose MRSA infection nor to guide or monitor treatment for MRSA infections. RESULT CALLED TO, READ BACK BY AND VERIFIED WITH: Brennan Bailey RN 15:30 07/01/19 ([email or web address]) Performed at [facility], [place] [address] [place], [place] [address]    Time coordinating discharge: 35 minutes  SIGNED:  Kerney Elbe, DO [facility] 07/02/2019, 2:27 PM Pager is on [place]  If 7PM-7AM, please  contact night-coverage [email or web address] Password [email or web address]

## 2019-07-02 NOTE — Progress Notes (Signed)
Physical Therapy Treatment Patient Details Name: Betty [name] MRN: [id number] DOB: 01/24/1946 Today's Date: 07/02/2019    History of Present Illness This is a 73 year old female with history of dementia, chronic combined systolic and diastolic CHF, EF of 02%, by V pacemaker, history of non-small cell lung cancer, COPD, stage IV chronic kidney disease was recently hospitalized to [facility] hospital with COVID in July, required mechanical intubation, clinically improved and discharged to [facility] rehabilitation on 8/8, this hospitalization was reportedly complicated by new onset paroxysmal A. fib, lower GI bleed, discharged on baby aspirin and metoprolol, dysphagia 3 diet.  Admitted with coffee ground emesis and low hemoglobin now s/p endoscopy positive for esophagitis.    PT Comments    Pt progressing with mobility. Very confused today.    Follow Up Recommendations  SNF;Supervision/Assistance - 24 hour     Equipment Recommendations  Rolling walker with 5" wheels;3in1 (PT)    Recommendations for Other Services       Precautions / Restrictions Precautions Precautions: Fall    Mobility  Bed Mobility Overal bed mobility: Needs Assistance Bed Mobility: Supine to Sit     Supine to sit: Min assist     General bed mobility comments: Assist to elevate trunk into sitting  Transfers Overall transfer level: Needs assistance Equipment used: Rolling walker (2 wheeled) Transfers: Sit to/from [facility] Sit to Stand: Min assist Stand pivot transfers: Min assist       General transfer comment: Assist to bring hips up and for balance  Ambulation/Gait Ambulation/Gait assistance: Min assist Gait Distance (Feet): 80 Feet Assistive device: Rolling walker (2 wheeled) Gait Pattern/deviations: Step-through pattern;Trunk flexed;Decreased stride length Gait velocity: decr Gait velocity interpretation: <1.31 ft/sec, indicative of household ambulator General Gait Details: Assist for  balance and support   Marine scientist [place] (Stroke Patients Only)       Balance Overall balance assessment: Needs assistance Sitting-balance support: No upper extremity supported Sitting balance-Leahy Scale: Good     Standing balance support: Bilateral upper extremity supported Standing balance-Leahy Scale: Poor Standing balance comment: walker and min guard for static standing                            Cognition Arousal/Alertness: Awake/alert Behavior During Therapy: WFL for tasks assessed/performed Overall Cognitive Status: No family/caregiver present to determine baseline cognitive functioning Area of Impairment: Orientation;Attention;Memory;Safety/judgement;Problem solving                 Orientation Level: Disoriented to;Time;Place;Situation Current Attention Level: Sustained Memory: Decreased short-term memory Following Commands: Follows one step commands with increased time Safety/Judgement: Decreased awareness of safety;Decreased awareness of deficits Awareness: Intellectual Problem Solving: Requires verbal cues;Requires tactile cues General Comments: Pt constantly talking about how it was a shame this place was shutting down.      Exercises      General Comments General comments (skin integrity, edema, etc.): Extended time due to pt incontinent of stool and urine      Pertinent Vitals/Pain Pain Assessment: No/denies pain    Home Living                      Prior Function            PT Goals (current goals can now be found in the care plan section) Progress towards PT goals: Progressing toward goals  Frequency    Min 2X/week      PT Plan Current plan remains appropriate    Co-evaluation              AM-PAC PT "6 Clicks" Mobility   Outcome Measure  Help needed turning from your back to your side while in a flat bed without using bedrails?: A Little Help  needed moving from lying on your back to sitting on the side of a flat bed without using bedrails?: A Little Help needed moving to and from a bed to a chair (including a wheelchair)?: A Little Help needed standing up from a chair using your arms (e.g., wheelchair or bedside chair)?: A Little Help needed to walk in hospital room?: A Little Help needed climbing 3-5 steps with a railing? : Total 6 Click Score: 16    End of Session   Activity Tolerance: Patient tolerated treatment well Patient left: with call bell/phone within reach;in bed;with bed alarm set Nurse Communication: Mobility status PT Visit Diagnosis: Muscle weakness (generalized) (M62.81);Other abnormalities of gait and mobility (R26.89)     Time: 4166-0630 PT Time Calculation (min) (ACUTE ONLY): 37 min  Charges:  $Gait Training: 23-37 mins                     [place] Pager [phone] Office [place] 07/02/2019, 5:08 PM

## 2019-07-02 NOTE — TOC Progression Note (Signed)
Transition of Care [facility]) - Progression Note    Patient Details  Name: Betty [name] MRN: [id number] Date of Birth: 1946/05/25  Transition of Care [facility]) CM/SW [place], [place] Phone Number: 07/02/2019, 4:36 PM  Clinical Narrative:  CSW spoke with patient's son Betty [name] earlier today to discuss bed offers, and son has chosen [facility]. Betty [name] discussed concerns about taking the patient to SNF and he is worried about the care, and CSW attempted to reassure him. Betty [name] would like to be able to visit through a window, if possible. CSW discussed with [facility] Admissions, and they will have patient placed in a window room.   Noting that patient is stable for discharge, but SNF will need an updated COVID test for her. SNF needs a negative COVID within 48 hours of admission. CSW alerted MD and explained barrier to discharge to son. CSW to follow.     Expected Discharge Plan: [place] Barriers to Discharge: Continued Medical Work up  Expected Discharge Plan and Services Expected Discharge Plan: [place] In-house Referral: Clinical Social Work Discharge Planning Services: NA Post Acute Care Choice: [place] Living arrangements for the past 2 months: [place], [place] Expected Discharge Date: 07/02/19               DME Arranged: N/A DME Agency: NA       HH Arranged: NA HH Agency: NA         Social Determinants of Health (SDOH) Interventions    Readmission Risk Interventions No flowsheet data found.

## 2019-07-02 NOTE — Plan of Care (Signed)

## 2019-07-02 NOTE — Care Management Important Message (Signed)
Important Message  Patient Details  Name: Betty [name] MRN: [id number] Date of Birth: 25-Jul-1946   Medicare Important Message Given:  Yes     Orbie Pyo 07/02/2019, 3:33 PM

## 2019-07-02 NOTE — Consult Note (Signed)
   [facility] CM Inpatient Consult   07/02/2019  LATRELL REITAN [birth year] [id number]   Follow up: Disposition  07/03/2019 12:30 pm: Patient to go to [facility], will have [facility] PAC to follow up.  Insurance: Medicare ACO  Chart review noted that current disposition is for a skilled nursing facility.  Following for disposition needs.  If patient transitions to a [facility] skilled facility, [facility] to follow up for transition.  For questions, please contact:  Natividad Brood, RN BSN [facility] Liaison  [phone] business mobile phone Toll free office [phone]  Fax number: [phone] [email or web address]

## 2019-07-03 DIAGNOSIS — Z950 Presence of cardiac pacemaker: Secondary | ICD-10-CM | POA: Diagnosis not present

## 2019-07-03 DIAGNOSIS — I469 Cardiac arrest, cause unspecified: Secondary | ICD-10-CM | POA: Diagnosis not present

## 2019-07-03 DIAGNOSIS — R0602 Shortness of breath: Secondary | ICD-10-CM | POA: Diagnosis not present

## 2019-07-03 DIAGNOSIS — R52 Pain, unspecified: Secondary | ICD-10-CM | POA: Diagnosis not present

## 2019-07-03 DIAGNOSIS — I959 Hypotension, unspecified: Secondary | ICD-10-CM | POA: Diagnosis not present

## 2019-07-03 DIAGNOSIS — I5042 Chronic combined systolic (congestive) and diastolic (congestive) heart failure: Secondary | ICD-10-CM | POA: Diagnosis not present

## 2019-07-03 DIAGNOSIS — R451 Restlessness and agitation: Secondary | ICD-10-CM | POA: Diagnosis not present

## 2019-07-03 DIAGNOSIS — F419 Anxiety disorder, unspecified: Secondary | ICD-10-CM | POA: Diagnosis not present

## 2019-07-03 DIAGNOSIS — L89152 Pressure ulcer of sacral region, stage 2: Secondary | ICD-10-CM | POA: Diagnosis not present

## 2019-07-03 DIAGNOSIS — M6281 Muscle weakness (generalized): Secondary | ICD-10-CM | POA: Diagnosis not present

## 2019-07-03 DIAGNOSIS — M2550 Pain in unspecified joint: Secondary | ICD-10-CM | POA: Diagnosis not present

## 2019-07-03 DIAGNOSIS — R112 Nausea with vomiting, unspecified: Secondary | ICD-10-CM | POA: Diagnosis not present

## 2019-07-03 DIAGNOSIS — E785 Hyperlipidemia, unspecified: Secondary | ICD-10-CM | POA: Diagnosis not present

## 2019-07-03 DIAGNOSIS — Z20828 Contact with and (suspected) exposure to other viral communicable diseases: Secondary | ICD-10-CM | POA: Diagnosis not present

## 2019-07-03 DIAGNOSIS — K219 Gastro-esophageal reflux disease without esophagitis: Secondary | ICD-10-CM | POA: Diagnosis not present

## 2019-07-03 DIAGNOSIS — K208 Other esophagitis: Secondary | ICD-10-CM | POA: Diagnosis not present

## 2019-07-03 DIAGNOSIS — I255 Ischemic cardiomyopathy: Secondary | ICD-10-CM | POA: Diagnosis not present

## 2019-07-03 DIAGNOSIS — U071 COVID-19: Secondary | ICD-10-CM | POA: Diagnosis not present

## 2019-07-03 DIAGNOSIS — Z902 Acquired absence of lung [part of]: Secondary | ICD-10-CM | POA: Diagnosis not present

## 2019-07-03 DIAGNOSIS — R079 Chest pain, unspecified: Secondary | ICD-10-CM | POA: Diagnosis not present

## 2019-07-03 DIAGNOSIS — R278 Other lack of coordination: Secondary | ICD-10-CM | POA: Diagnosis not present

## 2019-07-03 DIAGNOSIS — I480 Paroxysmal atrial fibrillation: Secondary | ICD-10-CM | POA: Diagnosis not present

## 2019-07-03 DIAGNOSIS — Z7401 Bed confinement status: Secondary | ICD-10-CM | POA: Diagnosis not present

## 2019-07-03 DIAGNOSIS — I428 Other cardiomyopathies: Secondary | ICD-10-CM | POA: Diagnosis not present

## 2019-07-03 DIAGNOSIS — Z7982 Long term (current) use of aspirin: Secondary | ICD-10-CM | POA: Diagnosis not present

## 2019-07-03 DIAGNOSIS — M109 Gout, unspecified: Secondary | ICD-10-CM | POA: Diagnosis not present

## 2019-07-03 DIAGNOSIS — C3491 Malignant neoplasm of unspecified part of right bronchus or lung: Secondary | ICD-10-CM | POA: Diagnosis not present

## 2019-07-03 DIAGNOSIS — J449 Chronic obstructive pulmonary disease, unspecified: Secondary | ICD-10-CM | POA: Diagnosis not present

## 2019-07-03 DIAGNOSIS — I499 Cardiac arrhythmia, unspecified: Secondary | ICD-10-CM | POA: Diagnosis not present

## 2019-07-03 DIAGNOSIS — N184 Chronic kidney disease, stage 4 (severe): Secondary | ICD-10-CM | POA: Diagnosis not present

## 2019-07-03 DIAGNOSIS — F0390 Unspecified dementia without behavioral disturbance: Secondary | ICD-10-CM | POA: Diagnosis not present

## 2019-07-03 DIAGNOSIS — K922 Gastrointestinal hemorrhage, unspecified: Secondary | ICD-10-CM | POA: Diagnosis not present

## 2019-07-03 DIAGNOSIS — F0150 Vascular dementia without behavioral disturbance: Secondary | ICD-10-CM | POA: Diagnosis not present

## 2019-07-03 DIAGNOSIS — I10 Essential (primary) hypertension: Secondary | ICD-10-CM | POA: Diagnosis not present

## 2019-07-03 DIAGNOSIS — E878 Other disorders of electrolyte and fluid balance, not elsewhere classified: Secondary | ICD-10-CM | POA: Diagnosis not present

## 2019-07-03 NOTE — TOC Transition Note (Signed)
Transition of Care [facility]) - CM/SW Discharge Note   Patient Details  Name: Betty [name] MRN: [id number] Date of Birth: 1946/07/27  Transition of Care [facility]) CM/SW Contact:  Geralynn Ochs, LCSW Phone Number: 07/03/2019, 11:14 AM   Clinical Narrative:   Nurse to call report to [phone], Room 310B.    Final next level of care: Skilled Nursing Facility Barriers to Discharge: Barriers Resolved   Patient Goals and CMS Choice Patient states their goals for this hospitalization and ongoing recovery are:: Pt son would like his mother to continue with therapy CMS Medicare.gov Compare Post Acute Care list provided to:: Patient Represenative (must comment) Choice offered to / list presented to : Adult Children  Discharge Placement              Patient chooses bed at: [place] [place] Patient to be transferred to facility by: [place] Name of family member notified: Chrissie Noa Patient and family notified of of transfer: 07/03/19  Discharge Plan and Services In-house Referral: Clinical Social Work Discharge Planning Services: NA Post Acute Care Choice: [place]          DME Arranged: N/A DME Agency: NA       HH Arranged: NA HH Agency: NA        Social Determinants of Health (SDOH) Interventions     Readmission Risk Interventions No flowsheet data found.

## 2019-07-03 NOTE — Progress Notes (Signed)
Report called to Arbor[facility]urist at [facility] facility. Pending transport.

## 2019-07-03 NOTE — Care Management Important Message (Signed)
Important Message  Patient Details  Name: Betty [name] MRN: [id number] Date of Birth: 18-Jan-1946   Medicare Important Message Given:  Yes     Orbie Pyo 07/03/2019, 4:17 PM

## 2019-07-05 DIAGNOSIS — K208 Other esophagitis: Secondary | ICD-10-CM | POA: Diagnosis not present

## 2019-07-05 DIAGNOSIS — J449 Chronic obstructive pulmonary disease, unspecified: Secondary | ICD-10-CM | POA: Diagnosis not present

## 2019-07-05 DIAGNOSIS — N184 Chronic kidney disease, stage 4 (severe): Secondary | ICD-10-CM | POA: Diagnosis not present

## 2019-07-05 DIAGNOSIS — L89152 Pressure ulcer of sacral region, stage 2: Secondary | ICD-10-CM | POA: Diagnosis not present

## 2019-07-05 DIAGNOSIS — I255 Ischemic cardiomyopathy: Secondary | ICD-10-CM | POA: Diagnosis not present

## 2019-07-05 DIAGNOSIS — I959 Hypotension, unspecified: Secondary | ICD-10-CM | POA: Diagnosis not present

## 2019-07-05 DIAGNOSIS — Z902 Acquired absence of lung [part of]: Secondary | ICD-10-CM | POA: Diagnosis not present

## 2019-07-05 DIAGNOSIS — F0150 Vascular dementia without behavioral disturbance: Secondary | ICD-10-CM | POA: Diagnosis not present

## 2019-07-09 ENCOUNTER — Other Ambulatory Visit: Payer: Self-pay | Admitting: *Deleted

## 2019-07-09 DIAGNOSIS — R0602 Shortness of breath: Secondary | ICD-10-CM | POA: Diagnosis not present

## 2019-07-09 NOTE — Patient Outreach (Addendum)
Member assessed for potential [facility] Care Management needs as a benefit of  [place] Medicare.  Member is currently receiving rehab therapy at [facility] SN[facility].  Member discussed in weekly telephonic IDT meeting with facility staff, [facility] UM RN, [name] MD Director, and writer.  Facility reports that disposition plan is to return home. States son will move in with her. However, Betty [name] (son) works during the day. Facility states that member's son reported Betty [name] is usually more confused when she is in a facility and that he thinks she will be less confused when she returns home. [facility] team expressed concerns regarding member returning home and the need for 24/7 assistance.   Facility reports Betty [name] has had some swallowing difficulty. Endo biopsy results were benign. She remains confused. History of dementia.   [facility] PAC MD Director suggests palliative care referral.   Will plan to outreach to member's son to discuss all of the above.  Will continue to collaborate with [facility] UM team and facility staff while Betty [name] resides in SNF.     Betty Rolling, MSN-Ed, RN,BSN [place] Acute Care Coordinator [phone] [facility]) [phone]  (Toll free office)

## 2019-07-17 ENCOUNTER — Other Ambulatory Visit: Payer: Self-pay | Admitting: *Deleted

## 2019-07-17 DIAGNOSIS — I10 Essential (primary) hypertension: Secondary | ICD-10-CM

## 2019-07-17 NOTE — Patient Outreach (Signed)
Member assessed for potential [facility] Care Management needs as a benefit of  [place] Medicare.  Member is currently receiving rehab therapy at [facility] SNF. She is slated for dc home today.  Collaboration with [place] about facility update. Per [facility] UM RN, Betty [name] is scheduled for dc on 07/17/19 with son. Facility update indicates son has been been made aware that Betty [name] will require 24/7.  Telephone call made to Betty [name] (son) [phone] to discuss [place] Management services. Member noted to have confusion. Patient identifiers confirmed.   Betty [name] reports he is on the way to pick his mother up from the facility. States he plans on being with his mother 24/7 until " I figure out how she is doing. I really don't know what kind of state she is in."  Betty [name] is agreeable to Betty [name] Management services for [facility] RNCM for complex case managment and [facility] social worker for potential community resources and long term planning assistance.  Betty [name] states he is not sure which home health agency will have. However, writer explained [place] Management will not interfere or replace services provided by home health.   Betty [name] endorses Betty [name] is member's PCP.   Betty [name] confirms best contact number  is  mobile at [phone]. Betty [name] is primary contact for Betty [name].  Betty [name] has had an extensive stay in the hospital. She is discharging from [facility] SNF today. Betty [name] has a medical history of COVID-19,  dementia, HTN, CHF, COPD, CKD stage IV, non small cell lung cancer.  Received update from facility dc planner that Betty [name] will have home health thru [facility] for PT/OT services. Rolling walker was ordered as well.  Will make [facility] RNCM and [facility] BSW referrals.    [name], MSN-Ed, RN,BSN [place] Acute Care Coordinator [phone] [facility]) [phone]  (Toll free office)

## 2019-07-20 ENCOUNTER — Other Ambulatory Visit: Payer: Self-pay

## 2019-07-20 NOTE — Patient Outreach (Signed)
[name]) Care Management  07/20/2019  Betty [name] [birth year] [id number]   High priority referral received from [place], [name] on Friday, 07/17/19.  Please assign to [place] for complex case management, [facility] BSW for community resources and long term care planning assistance. Member to dc from [facility] today 07/17/19. Will have home health thru [facility] for PT/OT. Multiple co-morbidities and high risk for readmission. Please assess need for potential palliative care services. Son- Alera Quevedo is primary contact at [phone]." Unsuccessful outreach to son today.  Left voicemail message.  Will attempt to reach again within four business days.  Ronn Melena, BSW Social Worker [phone]

## 2019-07-20 NOTE — Patient Outreach (Addendum)
[place] [facility]) Care Management  [place]  07/20/2019   Betty [name] 1946/06/13 [id number]   Transition of Care Referral  Referral Date: 07/17/2019 Referral Source: [facility] Coordinator Date of Admission: 07/03/2019 Diagnosis: GI Bleed Date of Discharge: 07/17/2019 Facility: [place]: Medicare   Outreach attempt #1 to patient. Spoke with son-Betty [name](DPR on file). He voices that he is managing patient's care. Patient has dementia with some short term memory loss. He states that patient is temporarily staying with him while he is having home modifications done to patient's home. He hares that he has not seen his mother in two months. He is unsure of her baseline and what she will be abel to do and not do on her own or if she will be abel to live home alone again. Son voices that he works and is unable to provide 24hr care to patient. He has already requested SW referral and patient has been assigned [facility] SW for outreach and follow up. Per son patient's income is about $2000/month as she will not qualify for Medicaid to get in home support.  Conditions: Per chart review, patient has PMH of HTN, lung CA s/p lobectomy, CKD, chest pain, cardiomyopathy and dementia. She was admitted to the hospital from 06/24/19-07/03/2019 for GI Bleed and then went to [facility] for rehab stay.  Medications: Meds reviewed with son. He states that facility sent patient home with meds to last for a awhile. Upon med review, with son noted that Albuterol inhaler was on list but med not in home. Son voices he has never known his mother to take an inhaler. He will call pharmacy to see if med there for pickup and if not advised to follow with PCP on rather or not patient should be taking meds. Encounter Medications:  Outpatient Encounter Medications as of 07/20/2019  Medication Sig Note  . acetaminophen (TYLENOL) 325 MG tablet Take 650 mg by mouth every 6 (six) hours as needed for  mild pain or moderate pain.   Marland Kitchen aspirin EC 81 MG tablet Take 81 mg by mouth at bedtime.   Marland Kitchen atorvastatin (LIPITOR) 40 MG tablet TAKE 1 TABLET BY MOUTH ONCE DAILY AT 6 PM (Patient taking differently: Take 40 mg by mouth daily at 6 PM. )   . Cholecalciferol (VITAMIN D) 2000 units tablet Take 2,000 Units by mouth at bedtime.   . donepezil (ARICEPT) 10 MG tablet Take 1 tablet (10 mg total) by mouth at bedtime.   . febuxostat (ULORIC) 40 MG tablet Take 40 mg by mouth at bedtime.    . furosemide (LASIX) 40 MG tablet Take 20 mg by mouth daily.    Marland Kitchen HYDROcodone-acetaminophen (NORCO/VICODIN) 5-325 MG tablet Take 1-2 tablets by mouth every 4 (four) hours as needed for moderate pain.   . hydrOXYzine (ATARAX/VISTARIL) 10 MG tablet Take 10 mg by mouth 2 (two) times daily. Anxiety 06/24/2019: Start 06/24/19 for five days Stop 06/28/19  for Anxiety  . isosorbide-hydrALAZINE (BIDIL) 20-37.5 MG tablet Take 1 tablet by mouth 2 (two) times daily.   Marland Kitchen LORazepam (ATIVAN) 0.5 MG tablet Take 0.5 mg by mouth every 8 (eight) hours as needed for anxiety.   . metoprolol succinate (TOPROL-XL) 100 MG 24 hr tablet Take 1 tablet (100 mg total) by mouth daily. Pt must keep upcoming appt in August for further refills. Thanks   . Multiple Vitamin (MULTIVITAMIN WITH MINERALS) TABS tablet Take 0.5 tablets by mouth at bedtime.   . nitroGLYCERIN (NITROSTAT)  0.4 MG SL tablet Place 1 tablet (0.4 mg total) under the tongue every 5 (five) minutes x 3 doses as needed for chest pain.   Marland Kitchen ondansetron (ZOFRAN) 4 MG tablet Take 1 tablet (4 mg total) by mouth every 6 (six) hours as needed for nausea.   . pantoprazole (PROTONIX) 40 MG tablet Take 1 tablet (40 mg total) by mouth 2 (two) times daily.   . sodium bicarbonate 650 MG tablet Take 1 tablet (650 mg total) by mouth 2 (two) times daily.   Marland Kitchen albuterol (VENTOLIN HFA) 108 (90 Base) MCG/ACT inhaler Inhale 2 puffs into the lungs every 6 (six) hours as needed for wheezing or shortness of breath.     No facility-administered encounter medications on file as of 07/20/2019.     Functional Status:  In your present state of health, do you have any difficulty performing the following activities: 05/14/2019  Hearing? N  Vision? N  Difficulty concentrating or making decisions? Y  Walking or climbing stairs? Y  Dressing or bathing? N  Doing errands, shopping? Y  Some recent data might be hidden    Fall/Depression Screening: Fall Risk  07/20/2019  Falls in the past year? 1  Number falls in past yr: 0  Injury with Fall? 0  Risk for fall due to : Impaired balance/gait;Impaired mobility;Mental status change  Follow up Falls evaluation completed;Education provided   PHQ 2/9 Scores 07/20/2019  PHQ - 2 Score 0  Exception Documentation Other- indicate reason in comment box    Appointments: Patient goes for PCP follow up on 07/14/2019. Son denies any issues with transportation to appt.  Advance Directives: MOST form on file. Son states he desires to have patient complete other advance directives before dementia worsens.  Consent: [facility] services reviewed and discussed with son. Verbal consent for services given.  Assessment:  [facility] CM Care Plan Problem One     Most Recent Value  Care Plan Problem One  Patient at high risk for readmission due to co-morbidities.  Role Documenting the Problem One  Care Management Telephonic [place] for Problem One  Active  [facility] Long Term Goal   Patient will have no readmission to hospital over the next 30 days.  [facility] Long Term Goal Start Date  07/20/19  Interventions for Problem One Long Term Goal  RN CM reviewed with son s/s of worsening condition and when to seek medical attention. RN CM confirmed patietnt has MD follow up appts and that meds are in the home.  TH[facility] CM Short Term Goal #1   Patient will complete all MD follow up appts over the course of the next 2-3 weeks.  THN CM Short Term Goal #1 Start Date  07/20/19  Interventions for Short Term  Goal #1  RN CM reviewed med list with son and discussed importance of adherence.  THN CM Short Term Goal #2   Patient will regain prior hospitalization functional status and be able to live independently.  TH[facility] CM Short Term Goal #2 Start Date  07/20/19  Interventions for Short Term Goal #2  RN CM reviewed fall/saety measures with son. RN CM confirmed that [facility] services are in place as well as [facility] SW referral for in home assistance.      Plan:  RN CM will follow up with patient/son within a week. RN CM will send successful outreach letter to patient. RN CM will send MD barriers letter and route encounter.  Enzo Montgomery, RN,BSN,CCM [place]  Management Coordinator Direct Phone: [phone] Toll Free: [phone] Fax: [phone]

## 2019-07-21 DIAGNOSIS — M1030 Gout due to renal impairment, unspecified site: Secondary | ICD-10-CM | POA: Diagnosis not present

## 2019-07-21 DIAGNOSIS — E785 Hyperlipidemia, unspecified: Secondary | ICD-10-CM | POA: Diagnosis not present

## 2019-07-21 DIAGNOSIS — E878 Other disorders of electrolyte and fluid balance, not elsewhere classified: Secondary | ICD-10-CM | POA: Diagnosis not present

## 2019-07-21 DIAGNOSIS — J1289 Other viral pneumonia: Secondary | ICD-10-CM | POA: Diagnosis not present

## 2019-07-21 DIAGNOSIS — D631 Anemia in chronic kidney disease: Secondary | ICD-10-CM | POA: Diagnosis not present

## 2019-07-21 DIAGNOSIS — D72829 Elevated white blood cell count, unspecified: Secondary | ICD-10-CM | POA: Diagnosis not present

## 2019-07-21 DIAGNOSIS — K2901 Acute gastritis with bleeding: Secondary | ICD-10-CM | POA: Diagnosis not present

## 2019-07-21 DIAGNOSIS — U071 COVID-19: Secondary | ICD-10-CM | POA: Diagnosis not present

## 2019-07-21 DIAGNOSIS — L539 Erythematous condition, unspecified: Secondary | ICD-10-CM | POA: Diagnosis not present

## 2019-07-21 DIAGNOSIS — I0981 Rheumatic heart failure: Secondary | ICD-10-CM | POA: Diagnosis not present

## 2019-07-21 DIAGNOSIS — I2510 Atherosclerotic heart disease of native coronary artery without angina pectoris: Secondary | ICD-10-CM | POA: Diagnosis not present

## 2019-07-21 DIAGNOSIS — I5042 Chronic combined systolic (congestive) and diastolic (congestive) heart failure: Secondary | ICD-10-CM | POA: Diagnosis not present

## 2019-07-21 DIAGNOSIS — I129 Hypertensive chronic kidney disease with stage 1 through stage 4 chronic kidney disease, or unspecified chronic kidney disease: Secondary | ICD-10-CM | POA: Diagnosis not present

## 2019-07-21 DIAGNOSIS — I428 Other cardiomyopathies: Secondary | ICD-10-CM | POA: Diagnosis not present

## 2019-07-21 DIAGNOSIS — K449 Diaphragmatic hernia without obstruction or gangrene: Secondary | ICD-10-CM | POA: Diagnosis not present

## 2019-07-21 DIAGNOSIS — I071 Rheumatic tricuspid insufficiency: Secondary | ICD-10-CM | POA: Diagnosis not present

## 2019-07-21 DIAGNOSIS — K210 Gastro-esophageal reflux disease with esophagitis: Secondary | ICD-10-CM | POA: Diagnosis not present

## 2019-07-21 DIAGNOSIS — I959 Hypotension, unspecified: Secondary | ICD-10-CM | POA: Diagnosis not present

## 2019-07-21 DIAGNOSIS — N189 Chronic kidney disease, unspecified: Secondary | ICD-10-CM | POA: Diagnosis not present

## 2019-07-21 DIAGNOSIS — K2211 Ulcer of esophagus with bleeding: Secondary | ICD-10-CM | POA: Diagnosis not present

## 2019-07-21 DIAGNOSIS — N179 Acute kidney failure, unspecified: Secondary | ICD-10-CM | POA: Diagnosis not present

## 2019-07-21 DIAGNOSIS — N2581 Secondary hyperparathyroidism of renal origin: Secondary | ICD-10-CM | POA: Diagnosis not present

## 2019-07-21 DIAGNOSIS — F419 Anxiety disorder, unspecified: Secondary | ICD-10-CM | POA: Diagnosis not present

## 2019-07-21 DIAGNOSIS — F0390 Unspecified dementia without behavioral disturbance: Secondary | ICD-10-CM | POA: Diagnosis not present

## 2019-07-21 DIAGNOSIS — E872 Acidosis: Secondary | ICD-10-CM | POA: Diagnosis not present

## 2019-07-21 DIAGNOSIS — J440 Chronic obstructive pulmonary disease with acute lower respiratory infection: Secondary | ICD-10-CM | POA: Diagnosis not present

## 2019-07-21 DIAGNOSIS — I480 Paroxysmal atrial fibrillation: Secondary | ICD-10-CM | POA: Diagnosis not present

## 2019-07-21 DIAGNOSIS — N184 Chronic kidney disease, stage 4 (severe): Secondary | ICD-10-CM | POA: Diagnosis not present

## 2019-07-22 ENCOUNTER — Ambulatory Visit: Payer: Self-pay

## 2019-07-22 ENCOUNTER — Other Ambulatory Visit: Payer: Self-pay

## 2019-07-22 DIAGNOSIS — F0390 Unspecified dementia without behavioral disturbance: Secondary | ICD-10-CM | POA: Diagnosis not present

## 2019-07-22 DIAGNOSIS — J440 Chronic obstructive pulmonary disease with acute lower respiratory infection: Secondary | ICD-10-CM | POA: Diagnosis not present

## 2019-07-22 DIAGNOSIS — J1289 Other viral pneumonia: Secondary | ICD-10-CM | POA: Diagnosis not present

## 2019-07-22 DIAGNOSIS — U071 COVID-19: Secondary | ICD-10-CM | POA: Diagnosis not present

## 2019-07-22 DIAGNOSIS — I5042 Chronic combined systolic (congestive) and diastolic (congestive) heart failure: Secondary | ICD-10-CM | POA: Diagnosis not present

## 2019-07-22 DIAGNOSIS — I0981 Rheumatic heart failure: Secondary | ICD-10-CM | POA: Diagnosis not present

## 2019-07-22 NOTE — Patient Outreach (Signed)
[place] [facility]) Care Management  07/22/2019  Betty [name] 1946/01/25 [id number]   High priority referral received from [place], [name] on Friday, 07/17/19.  Please assign to [place] for complex case management, [facility] BSW for community resources and long term care planning assistance. Member to dc from [facility] today 07/17/19. Will have home health thru [facility] for PT/OT. Multiple co-morbidities and high risk for readmission. Please assess need for potential palliative care services. Son- Makenzy Krist is primary contact at [phone]." Spoke with son briefly today but he requested a call back tomorrow because he was driving.   Ronn Melena, BSW Social Worker [phone]

## 2019-07-23 ENCOUNTER — Other Ambulatory Visit: Payer: Self-pay

## 2019-07-23 NOTE — Patient Outreach (Signed)
[facility] [place] [facility]) Care Management  07/23/2019  NESHIA MCKENZIE [birth year] [id number]   Social work referral received from [place], [name] on Friday, 07/17/19."Please assign to Sun for complex case management, [facility] BSW for community resources and long term care planning assistance. Member to dc from [facility] today 07/17/19. Will have home health thru [facility] for PT/OT. Multiple co-morbidities and high risk for readmission. Please assess need for potential palliative care services. Son- Birdie Fetty is primary contact at [phone]." Successful outreach to patient's son today.  Patient is currently staying with him while shower modification is being completed at her home. Patient will return home upon completion of modification.  Son plans to stay with her for a few days to get her settled.  After he returns, home he plans to drop by daily to assist with meals and medication.  Discussed personal care services, however, patient is over the income limit to qualify for Adult Medicaid.  Also talked about placement and long term care Medicaid if patient is not able to safely remain at home.   Discussed Advance Directives; son is familiar with documentation but stated that he no longer has the documentation.  Will mail Advance Directive EMMI and packet as well as Medicaid application in case patient decides to apply for long term care Medicaid.   Will follow up within the next two weeks to ensure receipt of documentation and further review Advance Directives if needed.  Ronn Melena, BSW Social Worker [phone]

## 2019-07-24 DIAGNOSIS — Z23 Encounter for immunization: Secondary | ICD-10-CM | POA: Diagnosis not present

## 2019-07-24 DIAGNOSIS — I5020 Unspecified systolic (congestive) heart failure: Secondary | ICD-10-CM | POA: Diagnosis not present

## 2019-07-24 DIAGNOSIS — F0150 Vascular dementia without behavioral disturbance: Secondary | ICD-10-CM | POA: Diagnosis not present

## 2019-07-24 DIAGNOSIS — N184 Chronic kidney disease, stage 4 (severe): Secondary | ICD-10-CM | POA: Diagnosis not present

## 2019-07-24 DIAGNOSIS — K922 Gastrointestinal hemorrhage, unspecified: Secondary | ICD-10-CM | POA: Diagnosis not present

## 2019-07-24 DIAGNOSIS — K209 Esophagitis, unspecified: Secondary | ICD-10-CM | POA: Diagnosis not present

## 2019-07-27 ENCOUNTER — Other Ambulatory Visit: Payer: Self-pay

## 2019-07-27 DIAGNOSIS — I0981 Rheumatic heart failure: Secondary | ICD-10-CM | POA: Diagnosis not present

## 2019-07-27 DIAGNOSIS — I5042 Chronic combined systolic (congestive) and diastolic (congestive) heart failure: Secondary | ICD-10-CM | POA: Diagnosis not present

## 2019-07-27 DIAGNOSIS — J1289 Other viral pneumonia: Secondary | ICD-10-CM | POA: Diagnosis not present

## 2019-07-27 DIAGNOSIS — J440 Chronic obstructive pulmonary disease with acute lower respiratory infection: Secondary | ICD-10-CM | POA: Diagnosis not present

## 2019-07-27 DIAGNOSIS — U071 COVID-19: Secondary | ICD-10-CM | POA: Diagnosis not present

## 2019-07-27 DIAGNOSIS — F0390 Unspecified dementia without behavioral disturbance: Secondary | ICD-10-CM | POA: Diagnosis not present

## 2019-07-27 NOTE — Patient Outreach (Signed)
[place] [facility] [facility]) Care Management  07/27/2019  Betty [name] 1946-06-23 [id number]   Transition of Care Weekly Call   Outreach attempt #1 to patient/son. No answer at present. RN CM left HIPAA compliant voicemail message along with contact info.     Plan: RN CM will make outreach attempt to patient within 3-4 business days.  Enzo Montgomery, RN,BSN,CCM [place] Management Telephonic Care Management Coordinator Direct Phone: [phone] Toll Free: [phone] Fax: [phone]

## 2019-07-29 ENCOUNTER — Other Ambulatory Visit: Payer: Self-pay

## 2019-07-29 DIAGNOSIS — J440 Chronic obstructive pulmonary disease with acute lower respiratory infection: Secondary | ICD-10-CM | POA: Diagnosis not present

## 2019-07-29 DIAGNOSIS — U071 COVID-19: Secondary | ICD-10-CM | POA: Diagnosis not present

## 2019-07-29 DIAGNOSIS — I0981 Rheumatic heart failure: Secondary | ICD-10-CM | POA: Diagnosis not present

## 2019-07-29 DIAGNOSIS — J1289 Other viral pneumonia: Secondary | ICD-10-CM | POA: Diagnosis not present

## 2019-07-29 DIAGNOSIS — I5042 Chronic combined systolic (congestive) and diastolic (congestive) heart failure: Secondary | ICD-10-CM | POA: Diagnosis not present

## 2019-07-29 DIAGNOSIS — F0390 Unspecified dementia without behavioral disturbance: Secondary | ICD-10-CM | POA: Diagnosis not present

## 2019-07-29 NOTE — Patient Outreach (Signed)
[facility]) Care Management  07/29/2019  Betty [name] 1946-01-11 [id number]   Transition of Care Weekly Call    Outreach attempt #2 to patient/son. No answer at patient's home and no answer on son's phone.      Plan: RN CM will make outreach attempt to patient/son within 3-4 business days.   Enzo Montgomery, RN,BSN,CCM [place] Management Telephonic Care Management Coordinator Direct Phone: [phone] Toll Free: [phone] Fax: [phone]

## 2019-07-30 DIAGNOSIS — J1289 Other viral pneumonia: Secondary | ICD-10-CM | POA: Diagnosis not present

## 2019-07-30 DIAGNOSIS — I0981 Rheumatic heart failure: Secondary | ICD-10-CM | POA: Diagnosis not present

## 2019-07-30 DIAGNOSIS — J440 Chronic obstructive pulmonary disease with acute lower respiratory infection: Secondary | ICD-10-CM | POA: Diagnosis not present

## 2019-07-30 DIAGNOSIS — U071 COVID-19: Secondary | ICD-10-CM | POA: Diagnosis not present

## 2019-07-30 DIAGNOSIS — I5042 Chronic combined systolic (congestive) and diastolic (congestive) heart failure: Secondary | ICD-10-CM | POA: Diagnosis not present

## 2019-07-30 DIAGNOSIS — F0390 Unspecified dementia without behavioral disturbance: Secondary | ICD-10-CM | POA: Diagnosis not present

## 2019-07-31 ENCOUNTER — Other Ambulatory Visit: Payer: Self-pay | Admitting: Cardiology

## 2019-07-31 ENCOUNTER — Other Ambulatory Visit: Payer: Self-pay

## 2019-07-31 ENCOUNTER — Other Ambulatory Visit: Payer: Self-pay | Admitting: Internal Medicine

## 2019-07-31 MED ORDER — METOPROLOL SUCCINATE ER 100 MG PO TB24: 100.0000 mg | ORAL_TABLET | Freq: Every day | ORAL | 0 refills | Status: DC

## 2019-07-31 NOTE — Patient Outreach (Signed)
[place] [facility]) Care Management  07/31/2019  Betty [name] [birth year] [id number]   Transition of Care Weekly Call    Outreach attempt to patient's son([name]). Spoke with caregiver who voices that patient has been doing well. He denies any acute issues or concerns. He states that he feels like patient is improving and getting better. Son also reports that home modification on patient's home are complete. He plans to try to move patient back home to see if she will be able to maintain there. He has a cousin that is in town visiting and plans to stay with patient for a little while. RN CM confirmed with son that he received packet from [facility] but has not yet had a chance to review info.He denies any RN CM needs at present. He is aware that he can call for any issues or concerns.    Plan: RN CM will make outreach attempt to patient/son within a week.  Enzo Montgomery, RN,BSN,CCM [place] Management Telephonic Care Management Coordinator Direct Phone: [phone] Toll Free: [phone] Fax: [phone]

## 2019-08-04 DIAGNOSIS — J1289 Other viral pneumonia: Secondary | ICD-10-CM | POA: Diagnosis not present

## 2019-08-04 DIAGNOSIS — I0981 Rheumatic heart failure: Secondary | ICD-10-CM | POA: Diagnosis not present

## 2019-08-04 DIAGNOSIS — U071 COVID-19: Secondary | ICD-10-CM | POA: Diagnosis not present

## 2019-08-04 DIAGNOSIS — J440 Chronic obstructive pulmonary disease with acute lower respiratory infection: Secondary | ICD-10-CM | POA: Diagnosis not present

## 2019-08-04 DIAGNOSIS — I5042 Chronic combined systolic (congestive) and diastolic (congestive) heart failure: Secondary | ICD-10-CM | POA: Diagnosis not present

## 2019-08-04 DIAGNOSIS — F0390 Unspecified dementia without behavioral disturbance: Secondary | ICD-10-CM | POA: Diagnosis not present

## 2019-08-05 DIAGNOSIS — I0981 Rheumatic heart failure: Secondary | ICD-10-CM | POA: Diagnosis not present

## 2019-08-05 DIAGNOSIS — J1289 Other viral pneumonia: Secondary | ICD-10-CM | POA: Diagnosis not present

## 2019-08-05 DIAGNOSIS — U071 COVID-19: Secondary | ICD-10-CM | POA: Diagnosis not present

## 2019-08-05 DIAGNOSIS — F0390 Unspecified dementia without behavioral disturbance: Secondary | ICD-10-CM | POA: Diagnosis not present

## 2019-08-05 DIAGNOSIS — J440 Chronic obstructive pulmonary disease with acute lower respiratory infection: Secondary | ICD-10-CM | POA: Diagnosis not present

## 2019-08-05 DIAGNOSIS — I5042 Chronic combined systolic (congestive) and diastolic (congestive) heart failure: Secondary | ICD-10-CM | POA: Diagnosis not present

## 2019-08-06 ENCOUNTER — Other Ambulatory Visit: Payer: Self-pay

## 2019-08-06 ENCOUNTER — Ambulatory Visit: Payer: Self-pay

## 2019-08-06 ENCOUNTER — Other Ambulatory Visit: Payer: Self-pay | Admitting: *Deleted

## 2019-08-06 DIAGNOSIS — U071 COVID-19: Secondary | ICD-10-CM | POA: Diagnosis not present

## 2019-08-06 DIAGNOSIS — J440 Chronic obstructive pulmonary disease with acute lower respiratory infection: Secondary | ICD-10-CM | POA: Diagnosis not present

## 2019-08-06 DIAGNOSIS — F0390 Unspecified dementia without behavioral disturbance: Secondary | ICD-10-CM | POA: Diagnosis not present

## 2019-08-06 DIAGNOSIS — I5042 Chronic combined systolic (congestive) and diastolic (congestive) heart failure: Secondary | ICD-10-CM | POA: Diagnosis not present

## 2019-08-06 DIAGNOSIS — I0981 Rheumatic heart failure: Secondary | ICD-10-CM | POA: Diagnosis not present

## 2019-08-06 DIAGNOSIS — J1289 Other viral pneumonia: Secondary | ICD-10-CM | POA: Diagnosis not present

## 2019-08-06 NOTE — Patient Outreach (Signed)
[facility] [facility]) Care Management  08/06/2019  Betty [name] 1946/08/06 [id number]   Attempted to contact son today to ensure receipt of Advance Directive packet and Medicaid application that were mailed on 07/23/19.  Left voicemail message.  Will attempt to reach again within four business days.  Ronn Melena, BSW Social Worker [phone]

## 2019-08-06 NOTE — Patient Outreach (Signed)
Telephone outreach to establish new care Freight forwarder. Unsuccessful call, left message and return phone number. I will call again on Monday, 08/10/19.  Eulah Pont. Myrtie Neither, MSN, [facility] Gerontological Nurse Practitioner [facility] Care Management [phone]

## 2019-08-07 ENCOUNTER — Other Ambulatory Visit: Payer: Self-pay | Admitting: *Deleted

## 2019-08-07 DIAGNOSIS — J440 Chronic obstructive pulmonary disease with acute lower respiratory infection: Secondary | ICD-10-CM | POA: Diagnosis not present

## 2019-08-07 DIAGNOSIS — J1289 Other viral pneumonia: Secondary | ICD-10-CM | POA: Diagnosis not present

## 2019-08-07 DIAGNOSIS — I5042 Chronic combined systolic (congestive) and diastolic (congestive) heart failure: Secondary | ICD-10-CM | POA: Diagnosis not present

## 2019-08-07 DIAGNOSIS — F0390 Unspecified dementia without behavioral disturbance: Secondary | ICD-10-CM | POA: Diagnosis not present

## 2019-08-07 DIAGNOSIS — I0981 Rheumatic heart failure: Secondary | ICD-10-CM | POA: Diagnosis not present

## 2019-08-07 DIAGNOSIS — U071 COVID-19: Secondary | ICD-10-CM | POA: Diagnosis not present

## 2019-08-10 ENCOUNTER — Other Ambulatory Visit: Payer: Self-pay

## 2019-08-10 ENCOUNTER — Other Ambulatory Visit: Payer: Self-pay | Admitting: *Deleted

## 2019-08-10 NOTE — Patient Outreach (Signed)
Telephone outreach. I was able to talk with Mr. Shalamar Plourde, pt's son. He says his mother has gone back to her home and for now she has 24 hour assistance from his cousin and himself.  He denies that she is having any aggravated problems. Her wt is stable, no SOB, no edema.  THN CM Care Plan Problem One     Most Recent Value  Care Plan Problem One  Patient at high risk for readmission due to co-morbidities.  Role Documenting the Problem One  Care Management Telephonic [place] for Problem One  Active  [facility] Long Term Goal   Patient will have no readmission to hospital over the next 30 days.  THN Long Term Goal Start Date  07/20/19  [facility] for Problem One Long Term Goal  Reviewed HF action plan with son and advised to call provider if she has wt gain, increased SOB, increased swelling to avoid complicationsl  THN CM Short Term Goal #1   Patient will complete all MD follow up appts over the course of the next 2-3 weeks.  THN CM Short Term Goal #1 Start Date  07/20/19  [facility] CM Short Term Goal #2   Patient will regain prior hospitalization functional status and be able to live independently.  THN CM Short Term Goal #2 Start Date  07/20/19     I will call and check in again in a week.  Eulah Pont. Myrtie Neither, MSN, [facility] Gerontological Nurse Practitioner [facility] Care Management [phone]

## 2019-08-11 ENCOUNTER — Inpatient Hospital Stay: Payer: Medicare Other | Attending: Internal Medicine

## 2019-08-11 ENCOUNTER — Other Ambulatory Visit: Payer: Self-pay

## 2019-08-11 DIAGNOSIS — F0390 Unspecified dementia without behavioral disturbance: Secondary | ICD-10-CM | POA: Diagnosis not present

## 2019-08-11 DIAGNOSIS — I0981 Rheumatic heart failure: Secondary | ICD-10-CM | POA: Diagnosis not present

## 2019-08-11 DIAGNOSIS — C3490 Malignant neoplasm of unspecified part of unspecified bronchus or lung: Secondary | ICD-10-CM

## 2019-08-11 DIAGNOSIS — C3411 Malignant neoplasm of upper lobe, right bronchus or lung: Secondary | ICD-10-CM | POA: Insufficient documentation

## 2019-08-11 DIAGNOSIS — I129 Hypertensive chronic kidney disease with stage 1 through stage 4 chronic kidney disease, or unspecified chronic kidney disease: Secondary | ICD-10-CM | POA: Insufficient documentation

## 2019-08-11 DIAGNOSIS — Z9221 Personal history of antineoplastic chemotherapy: Secondary | ICD-10-CM | POA: Diagnosis not present

## 2019-08-11 DIAGNOSIS — J449 Chronic obstructive pulmonary disease, unspecified: Secondary | ICD-10-CM | POA: Diagnosis not present

## 2019-08-11 DIAGNOSIS — I252 Old myocardial infarction: Secondary | ICD-10-CM | POA: Diagnosis not present

## 2019-08-11 DIAGNOSIS — I5042 Chronic combined systolic (congestive) and diastolic (congestive) heart failure: Secondary | ICD-10-CM | POA: Diagnosis not present

## 2019-08-11 DIAGNOSIS — Z79899 Other long term (current) drug therapy: Secondary | ICD-10-CM | POA: Diagnosis not present

## 2019-08-11 DIAGNOSIS — M129 Arthropathy, unspecified: Secondary | ICD-10-CM | POA: Diagnosis not present

## 2019-08-11 DIAGNOSIS — Z7982 Long term (current) use of aspirin: Secondary | ICD-10-CM | POA: Insufficient documentation

## 2019-08-11 DIAGNOSIS — J1289 Other viral pneumonia: Secondary | ICD-10-CM | POA: Diagnosis not present

## 2019-08-11 DIAGNOSIS — N184 Chronic kidney disease, stage 4 (severe): Secondary | ICD-10-CM | POA: Diagnosis not present

## 2019-08-11 DIAGNOSIS — E7800 Pure hypercholesterolemia, unspecified: Secondary | ICD-10-CM | POA: Diagnosis not present

## 2019-08-11 DIAGNOSIS — Z86010 Personal history of colonic polyps: Secondary | ICD-10-CM | POA: Insufficient documentation

## 2019-08-11 DIAGNOSIS — J440 Chronic obstructive pulmonary disease with acute lower respiratory infection: Secondary | ICD-10-CM | POA: Diagnosis not present

## 2019-08-11 DIAGNOSIS — E559 Vitamin D deficiency, unspecified: Secondary | ICD-10-CM | POA: Diagnosis not present

## 2019-08-11 DIAGNOSIS — U071 COVID-19: Secondary | ICD-10-CM | POA: Diagnosis not present

## 2019-08-11 DIAGNOSIS — Z87891 Personal history of nicotine dependence: Secondary | ICD-10-CM | POA: Insufficient documentation

## 2019-08-11 LAB — CMP (CANCER CENTER ONLY)
ALT: 10 U/L (ref 0–44)
AST: 17 U/L (ref 15–41)
Albumin: 3.6 g/dL (ref 3.5–5.0)
Alkaline Phosphatase: 74 U/L (ref 38–126)
Anion gap: 13 (ref 5–15)
BUN: 23 mg/dL (ref 8–23)
CO2: 22 mmol/L (ref 22–32)
Calcium: 9 mg/dL (ref 8.9–10.3)
Chloride: 110 mmol/L (ref 98–111)
Creatinine: 2.33 mg/dL — ABNORMAL HIGH (ref 0.44–1.00)
GFR, Est AFR Am: 23 mL/min — ABNORMAL LOW (ref >60)
GFR, Estimated: 20 mL/min — ABNORMAL LOW (ref >60)
Glucose, Bld: 108 mg/dL — ABNORMAL HIGH (ref 70–99)
Potassium: 3.1 mmol/L — ABNORMAL LOW (ref 3.5–5.1)
Sodium: 145 mmol/L (ref 135–145)
Total Bilirubin: 0.6 mg/dL (ref 0.3–1.2)
Total Protein: 6.8 g/dL (ref 6.5–8.1)

## 2019-08-11 LAB — CBC WITH DIFFERENTIAL (CANCER CENTER ONLY)
Abs Immature Granulocytes: 0.03 10*3/uL (ref 0.00–0.07)
Basophils Absolute: 0.1 10*3/uL (ref 0.0–0.1)
Basophils Relative: 1 %
Eosinophils Absolute: 0.1 10*3/uL (ref 0.0–0.5)
Eosinophils Relative: 1 %
HCT: 37.2 % (ref 36.0–46.0)
Hemoglobin: 12.1 g/dL (ref 12.0–15.0)
Immature Granulocytes: 0 %
Lymphocytes Relative: 21 %
Lymphs Abs: 2 10*3/uL (ref 0.7–4.0)
MCH: 29.9 pg (ref 26.0–34.0)
MCHC: 32.5 g/dL (ref 30.0–36.0)
MCV: 91.9 fL (ref 80.0–100.0)
Monocytes Absolute: 0.5 10*3/uL (ref 0.1–1.0)
Monocytes Relative: 5 %
Neutro Abs: 6.7 10*3/uL (ref 1.7–7.7)
Neutrophils Relative %: 72 %
Platelet Count: 302 10*3/uL (ref 150–400)
RBC: 4.05 MIL/uL (ref 3.87–5.11)
RDW: 14.4 % (ref 11.5–15.5)
WBC Count: 9.4 10*3/uL (ref 4.0–10.5)
nRBC: 0 % (ref 0.0–0.2)

## 2019-08-12 ENCOUNTER — Ambulatory Visit: Payer: Self-pay

## 2019-08-12 ENCOUNTER — Other Ambulatory Visit: Payer: Self-pay

## 2019-08-12 NOTE — Patient Outreach (Signed)
[facility]) Care Management  08/12/2019  Betty [name] [birth year] [id number]   Second attempt to contact son today to ensure receipt of Advance Directive packet and Medicaid application that were mailed on 07/23/19.  Left voicemail message.  Will attempt to reach again within four business days.  Ronn Melena, BSW Social Worker [phone]

## 2019-08-13 ENCOUNTER — Other Ambulatory Visit: Payer: Self-pay

## 2019-08-13 ENCOUNTER — Encounter: Payer: Self-pay | Admitting: Internal Medicine

## 2019-08-13 ENCOUNTER — Inpatient Hospital Stay (HOSPITAL_BASED_OUTPATIENT_CLINIC_OR_DEPARTMENT_OTHER): Payer: Medicare Other | Admitting: Internal Medicine

## 2019-08-13 VITALS — BP 119/55 | HR 96 | Temp 98.2°F | Resp 18 | Ht 64.0 in | Wt 128.7 lb

## 2019-08-13 DIAGNOSIS — N184 Chronic kidney disease, stage 4 (severe): Secondary | ICD-10-CM | POA: Diagnosis not present

## 2019-08-13 DIAGNOSIS — J449 Chronic obstructive pulmonary disease, unspecified: Secondary | ICD-10-CM | POA: Diagnosis not present

## 2019-08-13 DIAGNOSIS — I252 Old myocardial infarction: Secondary | ICD-10-CM | POA: Diagnosis not present

## 2019-08-13 DIAGNOSIS — I10 Essential (primary) hypertension: Secondary | ICD-10-CM

## 2019-08-13 DIAGNOSIS — C3491 Malignant neoplasm of unspecified part of right bronchus or lung: Secondary | ICD-10-CM | POA: Diagnosis not present

## 2019-08-13 DIAGNOSIS — C3490 Malignant neoplasm of unspecified part of unspecified bronchus or lung: Secondary | ICD-10-CM

## 2019-08-13 DIAGNOSIS — M129 Arthropathy, unspecified: Secondary | ICD-10-CM | POA: Diagnosis not present

## 2019-08-13 DIAGNOSIS — C3411 Malignant neoplasm of upper lobe, right bronchus or lung: Secondary | ICD-10-CM | POA: Diagnosis not present

## 2019-08-13 DIAGNOSIS — I129 Hypertensive chronic kidney disease with stage 1 through stage 4 chronic kidney disease, or unspecified chronic kidney disease: Secondary | ICD-10-CM | POA: Diagnosis not present

## 2019-08-13 NOTE — Progress Notes (Signed)
[place] Telephone:([phone]) [phone]   Fax:([phone]) [phone]  OFFICE PROGRESS NOTE  Josetta Huddle, MD [address] [place] [address]  DIAGNOSIS: Non-small cell carcinoma of right lung, stage 1  Staging form: Lung, AJCC 7th Edition  Clinical: Stage IB (T2a, N0, M0) - Unsigned  PRIOR THERAPY:  1) Status post right upper thorascoscopic lobectomy on 04/29/2015 under the care of Dr. Roxan Hockey. 2) Adjuvant chemotherapy with carboplatin AUC 5 given on day 1 and gemcitabine 1000 mg/m2 given on days 1 and 8 every 3 weeks. Status post 4 cycles, last dose was given 09/07/2015.  CURRENT THERAPY: Observation.  INTERVAL HISTORY: Betty [name] 73 y.o. female returns to the clinic today for annual follow-up visit.  The patient is feeling fine today with no concerning complaints.  She denied having any current chest pain, shortness breath, cough or hemoptysis.  She denied having any fever or chills.  She has no nausea, vomiting, diarrhea or constipation.  She denied having any headache or visual changes.  She was supposed to have repeat CT scan of the chest before her visit today but unfortunately the scan was not scheduled as planned.     MEDICAL HISTORY: Past Medical History:  Diagnosis Date  . 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 Acute mastitis of right breast 10/2003  . Arthritis    bursitis in right shoulder (05/21/2018)  . 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 CKD (chronic kidney disease), stage IV ([place])   . Colon polyps   . 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 Dementia ([place]) 07/16/2018  . Family history of breast cancer   . GERD (gastroesophageal reflux disease)   . Hypercholesterolemia   . Hypertension   . Non-small cell carcinoma of right lung, stage 1 ([place]) 05/17/2015   Stage IB, right upper lobectomy 04/29/15, chemo   . NSTEMI (non-ST  elevated myocardial infarction) ([place]) 03/24/2018  . Presence of permanent cardiac pacemaker 05/21/2018  . Pure hypercholesterolemia   . Sciatica of right side   . Seasonal allergic rhinitis   . Tobacco dependence   . Tubular adenoma of colon   . Vitamin D deficiency     ALLERGIES:  is allergic to sulfa antibiotics.  MEDICATIONS:  Current Outpatient Medications  Medication Sig Dispense Refill  . acetaminophen (TYLENOL) 325 MG tablet Take 650 mg by mouth every 6 (six) hours as needed for mild pain or moderate pain.    Marland Kitchen albuterol (VENTOLIN HFA) 108 (90 Base) MCG/ACT inhaler Inhale 2 puffs into the lungs every 6 (six) hours as needed for wheezing or shortness of breath.    Marland Kitchen aspirin EC 81 MG tablet Take 81 mg by mouth at bedtime.    Marland Kitchen atorvastatin (LIPITOR) 40 MG tablet TAKE 1 TABLET BY MOUTH ONCE DAILY AT 6 PM (Patient taking differently: Take 40 mg by mouth daily at 6 PM. ) 90 tablet 1  . Cholecalciferol (VITAMIN D) 2000 units tablet Take 2,000 Units by mouth at bedtime.    . donepezil (ARICEPT) 10 MG tablet Take 1 tablet (10 mg total) by mouth at bedtime. 30 tablet 5  . febuxostat (ULORIC) 40 MG tablet Take 40 mg by mouth at bedtime.     . furosemide (LASIX) 40 MG tablet Take 1 tablet (40 mg total) by mouth daily. Please make yearly appt with Dr. Caryl Comes for October before anymore refills.  1st attempt 30 tablet 0  . HYDROcodone-acetaminophen (NORCO/VICODIN) 5-325 MG tablet Take 1-2 tablets by mouth every 4 (four) hours as needed for moderate pain. 10 tablet 0  . hydrOXYzine (ATARAX/VISTARIL) 10 MG tablet Take 10 mg by mouth 2 (two) times daily. Anxiety    . isosorbide-hydrALAZINE (BIDIL) 20-37.5 MG tablet Take 1 tablet by mouth 2 (two) times daily. 180 tablet 3  . LORazepam (ATIVAN) 0.5 MG tablet Take 0.5 mg by mouth every 8 (eight) hours as needed for anxiety.    . metoprolol succinate (TOPROL-XL) 100 MG 24 hr tablet Take 1 tablet (100 mg total) by mouth daily. Please make yearly appt with  Dr. Tamala Julian for November before anymore refills. 1st attempt 90 tablet 0  . Multiple Vitamin (MULTIVITAMIN WITH MINERALS) TABS tablet Take 0.5 tablets by mouth at bedtime.    . nitroGLYCERIN (NITROSTAT) 0.4 MG SL tablet Place 1 tablet (0.4 mg total) under the tongue every 5 (five) minutes x 3 doses as needed for chest pain. 25 tablet 12  . ondansetron (ZOFRAN) 4 MG tablet Take 1 tablet (4 mg total) by mouth every 6 (six) hours as needed for nausea. 20 tablet 0  . pantoprazole (PROTONIX) 40 MG tablet Take 1 tablet (40 mg total) by mouth 2 (two) times daily. 60 tablet 0  . sodium bicarbonate 650 MG tablet Take 1 tablet (650 mg total) by mouth 2 (two) times daily. 30 tablet 0   No current facility-administered medications for this visit.     SURGICAL HISTORY:  Past Surgical History:  Procedure Laterality Date  . BIOPSY  06/26/2019   Procedure: BIOPSY;  Surgeon: Wilford Corner, MD;  Location: [facility] ENDOSCOPY;  Service: Endoscopy;;  . BIV PACEMAKER INSERTION CRT-P N/A 05/21/2018   Procedure: BIV PACEMAKER INSERTION CRT-P;  Surgeon: Deboraha Sprang, MD;  Location: [place] CV LAB;  Service: Cardiovascular;  Laterality: N/A;  . BREAST SURGERY     small mass removed from left breast--benign  . CRYO INTERCOSTAL NERVE BLOCK Right 04/29/2015   Procedure: CRYO INTERCOSTAL NERVE BLOCK;  Surgeon: [name], MD;  Location: [place];  Service: Thoracic;  Laterality: Right;  . ESOPHAGOGASTRODUODENOSCOPY (EGD) WITH PROPOFOL N/A 06/26/2019   Procedure: ESOPHAGOGASTRODUODENOSCOPY (EGD) WITH PROPOFOL;  Surgeon: Wilford Corner, MD;  Location: [place];  Service: Endoscopy;  Laterality: N/A;  . INSERT / REPLACE / REMOVE PACEMAKER  05/21/2018  . LOBECTOMY Right 04/29/2015   Procedure: RIGHT LOWER LUNG LOBECTOMY ;  Surgeon: [name], MD;  Location: [place];  Service: Thoracic;  Laterality: Right;  . LYMPH NODE DISSECTION Right 04/29/2015   Procedure: RIGHT LUNG LYMPH NODE DISSECTION;  Surgeon:  [name], MD;  Location: [place];  Service: Thoracic;  Laterality: Right;  . RIGHT/LEFT HEART CATH AND CORONARY ANGIOGRAPHY N/A 04/24/2018   Procedure: RIGHT/LEFT HEART CATH AND CORONARY ANGIOGRAPHY;  Surgeon: [name], MD;  Location: [place] CV LAB;  Service: Cardiovascular;  Laterality: N/A;  . VAGINAL DELIVERY     51 yrs ago  . VIDEO ASSISTED THORACOSCOPY (VATS)/ LOBECTOMY Right 04/29/2015   Procedure: RIGHT VIDEO ASSISTED THORACOSCOPY (VATS) WEDGE RESECTION/ RIGHT LOWER LOBECTOMY, CRYO-ANALGESIA OF INTERCOSTAL NERVES;  Surgeon: [name], MD;  Location: [place];  Service: Thoracic;  Laterality: Right;    REVIEW OF SYSTEMS:  A comprehensive review of systems was negative.   PHYSICAL EXAMINATION: General appearance: alert, cooperative and no distress Head: Normocephalic, without obvious abnormality, atraumatic Neck: no adenopathy, no JVD, supple, symmetrical, trachea midline and  thyroid not enlarged, symmetric, no tenderness/mass/nodules Lymph nodes: Cervical, supraclavicular, and axillary nodes normal. Resp: clear to auscultation bilaterally Back: symmetric, no curvature. ROM normal. No CVA tenderness. Cardio: regular rate and rhythm, S1, S2 normal, no murmur, click, rub or gallop GI: soft, non-tender; bowel sounds normal; no masses,  no organomegaly Extremities: extremities normal, atraumatic, no cyanosis or edema  ECOG PERFORMANCE STATUS: 1 - Symptomatic but completely ambulatory  Blood pressure (!) 119/55, pulse 96, temperature 98.2 F (36.8 C), temperature source Temporal, resp. rate 18, height 5\' 4"  (1.626 m), weight 128 lb 11.2 oz (58.4 kg), SpO2 92 %.  LABORATORY DATA: Lab Results  Component Value Date   WBC 9.4 08/11/2019   HGB 12.1 08/11/2019   HCT 37.2 08/11/2019   MCV 91.9 08/11/2019   PLT 302 08/11/2019      Chemistry      Component Value Date/Time   NA 145 08/11/2019 1108   NA 143 06/02/2018 1505   NA 145 09/18/2016 1057   K 3.1  (L) 08/11/2019 1108   K 4.9 09/18/2016 1057   CL 110 08/11/2019 1108   CO2 22 08/11/2019 1108   CO2 22 09/18/2016 1057   BUN 23 08/11/2019 1108   BUN 28 (H) 06/02/2018 1505   BUN 39.6 (H) 09/18/2016 1057   CREATININE 2.33 (H) 08/11/2019 1108   CREATININE 2.8 (H) 09/18/2016 1057      Component Value Date/Time   CALCIUM 9.0 08/11/2019 1108   CALCIUM 9.8 09/18/2016 1057   ALKPHOS 74 08/11/2019 1108   ALKPHOS 123 09/18/2016 1057   AST 17 08/11/2019 1108   AST 19 09/18/2016 1057   ALT 10 08/11/2019 1108   ALT 12 09/18/2016 1057   BILITOT 0.6 08/11/2019 1108   BILITOT 0.35 09/18/2016 1057       RADIOGRAPHIC STUDIES: No results found.  ASSESSMENT AND PLAN: This is a very pleasant 73 years old African-American female with a stage IB non-small cell lung cancer status post right lower lobectomy followed by 4 cycles of adjuvant chemotherapy with carboplatin and gemcitabine and tolerated her treatment fairly well. She has been in observation since November 2016. The patient is doing fine today with no concerning complaints.  She was supposed to have repeat imaging studies before her visit but this was not done as planned. I will arrange for the patient to have CT scan of the chest without contrast in the next few days.  We will call the patient with the results.  If there is no concerning findings on the imaging studies, I will see her back for follow-up visit in 1 year with repeat CT scan of the chest but if there is any concerning findings I will arrange for her to come sooner for evaluation and discussion of her scan results and recommendation regarding her condition. The patient was advised to call immediately if she has any concerning symptoms in the interval.  The patient voices understanding of current disease status and treatment options and is in agreement with the current care plan.  All questions were answered. The patient knows to call the clinic with any problems, questions or  concerns. We can certainly see the patient much sooner if necessary.  Disclaimer: This note was dictated with voice recognition software. Similar sounding words can inadvertently be transcribed and may not be corrected upon review.

## 2019-08-14 ENCOUNTER — Ambulatory Visit: Payer: Self-pay

## 2019-08-14 ENCOUNTER — Other Ambulatory Visit: Payer: Self-pay

## 2019-08-14 NOTE — Patient Outreach (Signed)
[facility]) Care Management  08/14/2019  Betty [name] 1945/12/02 [id number]   Third attempt to contact son today to ensure receipt of Advance Directive packet and Medicaid application that he requested be mailed to his address on 07/23/19. Left voicemail message. Closing social work case due to inability to maintain contact.    Ronn Melena, BSW Social Worker [phone]

## 2019-08-17 ENCOUNTER — Other Ambulatory Visit: Payer: Self-pay | Admitting: *Deleted

## 2019-08-17 ENCOUNTER — Other Ambulatory Visit: Payer: Self-pay

## 2019-08-17 NOTE — Patient Outreach (Signed)
Transition of care PAC to home. No answer, left a message on her son's number, unable to her home number.  Betty [name]. Myrtie Neither, MSN, [facility] Gerontological Nurse Practitioner [facility] Care Management [phone]

## 2019-08-18 ENCOUNTER — Telehealth: Payer: Self-pay | Admitting: Internal Medicine

## 2019-08-18 NOTE — Telephone Encounter (Signed)
Scheduled appt per 10/8 los - mailed reminder letter with appt date and time   

## 2019-08-19 DIAGNOSIS — F0390 Unspecified dementia without behavioral disturbance: Secondary | ICD-10-CM | POA: Diagnosis not present

## 2019-08-19 DIAGNOSIS — I5042 Chronic combined systolic (congestive) and diastolic (congestive) heart failure: Secondary | ICD-10-CM | POA: Diagnosis not present

## 2019-08-19 DIAGNOSIS — J1289 Other viral pneumonia: Secondary | ICD-10-CM | POA: Diagnosis not present

## 2019-08-19 DIAGNOSIS — J440 Chronic obstructive pulmonary disease with acute lower respiratory infection: Secondary | ICD-10-CM | POA: Diagnosis not present

## 2019-08-19 DIAGNOSIS — I0981 Rheumatic heart failure: Secondary | ICD-10-CM | POA: Diagnosis not present

## 2019-08-19 DIAGNOSIS — U071 COVID-19: Secondary | ICD-10-CM | POA: Diagnosis not present

## 2019-08-20 ENCOUNTER — Other Ambulatory Visit: Payer: Self-pay | Admitting: *Deleted

## 2019-08-20 ENCOUNTER — Other Ambulatory Visit: Payer: Self-pay

## 2019-08-20 ENCOUNTER — Ambulatory Visit (HOSPITAL_COMMUNITY)
Admission: RE | Admit: 2019-08-20 | Discharge: 2019-08-20 | Disposition: A | Discharge status: Home / Self Care | Payer: Medicare Other | Source: Ambulatory Visit | Attending: Internal Medicine | Admitting: Internal Medicine

## 2019-08-20 DIAGNOSIS — I0981 Rheumatic heart failure: Secondary | ICD-10-CM | POA: Diagnosis not present

## 2019-08-20 DIAGNOSIS — K2901 Acute gastritis with bleeding: Secondary | ICD-10-CM | POA: Diagnosis not present

## 2019-08-20 DIAGNOSIS — C3490 Malignant neoplasm of unspecified part of unspecified bronchus or lung: Secondary | ICD-10-CM | POA: Insufficient documentation

## 2019-08-20 DIAGNOSIS — I428 Other cardiomyopathies: Secondary | ICD-10-CM | POA: Diagnosis not present

## 2019-08-20 DIAGNOSIS — U071 COVID-19: Secondary | ICD-10-CM | POA: Diagnosis not present

## 2019-08-20 DIAGNOSIS — D72829 Elevated white blood cell count, unspecified: Secondary | ICD-10-CM | POA: Diagnosis not present

## 2019-08-20 DIAGNOSIS — I2510 Atherosclerotic heart disease of native coronary artery without angina pectoris: Secondary | ICD-10-CM | POA: Diagnosis not present

## 2019-08-20 DIAGNOSIS — N179 Acute kidney failure, unspecified: Secondary | ICD-10-CM | POA: Diagnosis not present

## 2019-08-20 DIAGNOSIS — J1289 Other viral pneumonia: Secondary | ICD-10-CM | POA: Diagnosis not present

## 2019-08-20 DIAGNOSIS — E872 Acidosis: Secondary | ICD-10-CM | POA: Diagnosis not present

## 2019-08-20 DIAGNOSIS — L539 Erythematous condition, unspecified: Secondary | ICD-10-CM | POA: Diagnosis not present

## 2019-08-20 DIAGNOSIS — E785 Hyperlipidemia, unspecified: Secondary | ICD-10-CM | POA: Diagnosis not present

## 2019-08-20 DIAGNOSIS — K449 Diaphragmatic hernia without obstruction or gangrene: Secondary | ICD-10-CM | POA: Diagnosis not present

## 2019-08-20 DIAGNOSIS — J440 Chronic obstructive pulmonary disease with acute lower respiratory infection: Secondary | ICD-10-CM | POA: Diagnosis not present

## 2019-08-20 DIAGNOSIS — M1030 Gout due to renal impairment, unspecified site: Secondary | ICD-10-CM | POA: Diagnosis not present

## 2019-08-20 DIAGNOSIS — R911 Solitary pulmonary nodule: Secondary | ICD-10-CM | POA: Diagnosis not present

## 2019-08-20 DIAGNOSIS — I071 Rheumatic tricuspid insufficiency: Secondary | ICD-10-CM | POA: Diagnosis not present

## 2019-08-20 DIAGNOSIS — I480 Paroxysmal atrial fibrillation: Secondary | ICD-10-CM | POA: Diagnosis not present

## 2019-08-20 DIAGNOSIS — I5042 Chronic combined systolic (congestive) and diastolic (congestive) heart failure: Secondary | ICD-10-CM | POA: Diagnosis not present

## 2019-08-20 DIAGNOSIS — E878 Other disorders of electrolyte and fluid balance, not elsewhere classified: Secondary | ICD-10-CM | POA: Diagnosis not present

## 2019-08-20 DIAGNOSIS — K2100 Gastro-esophageal reflux disease with esophagitis, without bleeding: Secondary | ICD-10-CM | POA: Diagnosis not present

## 2019-08-20 DIAGNOSIS — F419 Anxiety disorder, unspecified: Secondary | ICD-10-CM | POA: Diagnosis not present

## 2019-08-20 DIAGNOSIS — K2211 Ulcer of esophagus with bleeding: Secondary | ICD-10-CM | POA: Diagnosis not present

## 2019-08-20 DIAGNOSIS — N184 Chronic kidney disease, stage 4 (severe): Secondary | ICD-10-CM | POA: Diagnosis not present

## 2019-08-20 DIAGNOSIS — F0390 Unspecified dementia without behavioral disturbance: Secondary | ICD-10-CM | POA: Diagnosis not present

## 2019-08-20 DIAGNOSIS — I959 Hypotension, unspecified: Secondary | ICD-10-CM | POA: Diagnosis not present

## 2019-08-20 NOTE — Patient Outreach (Signed)
Telephone outreach.  Mrs. Court reports she is feeling better but still weak. She reports her wts are about the same no significant wt gain, no SOB and minimal edema.  Continues to see primary, cardiology and oncology regularly. CT scheduled for this afternoon.  Son is very vigelent and ensures all pt needs are met.  Will call again in 2 weeks.  Eulah Pont. Myrtie Neither, MSN, [facility] Gerontological Nurse Practitioner [facility] Care Management [phone]

## 2019-08-21 ENCOUNTER — Telehealth: Payer: Self-pay | Admitting: *Deleted

## 2019-08-21 NOTE — Telephone Encounter (Signed)
-----   Message from Curt Bears, MD sent at 08/20/2019  4:53 PM EDT ----- If you are at the desk tomorrow 08/21/2019, please let the patient and her son notes that her scan is fine and we will see her back for follow-up visit in 1 year and has previously planned.  Thank you. ----- Message ----- From: Interface, Rad Results In Sent: 08/20/2019   3:17 PM EDT To: Curt Bears, MD

## 2019-08-21 NOTE — Telephone Encounter (Signed)
TCT patient regarding recent scan results.  Spoke with patient and informed her that her scan is fine and that she just needs to come back in 1 year as planned.  She voiced understanding. No further questions or concerns

## 2019-08-24 DIAGNOSIS — J440 Chronic obstructive pulmonary disease with acute lower respiratory infection: Secondary | ICD-10-CM | POA: Diagnosis not present

## 2019-08-24 DIAGNOSIS — F0390 Unspecified dementia without behavioral disturbance: Secondary | ICD-10-CM | POA: Diagnosis not present

## 2019-08-24 DIAGNOSIS — J1289 Other viral pneumonia: Secondary | ICD-10-CM | POA: Diagnosis not present

## 2019-08-24 DIAGNOSIS — U071 COVID-19: Secondary | ICD-10-CM | POA: Diagnosis not present

## 2019-08-24 DIAGNOSIS — I0981 Rheumatic heart failure: Secondary | ICD-10-CM | POA: Diagnosis not present

## 2019-08-24 DIAGNOSIS — I5042 Chronic combined systolic (congestive) and diastolic (congestive) heart failure: Secondary | ICD-10-CM | POA: Diagnosis not present

## 2019-08-25 ENCOUNTER — Other Ambulatory Visit: Payer: Self-pay

## 2019-09-04 ENCOUNTER — Other Ambulatory Visit: Payer: Self-pay

## 2019-09-04 ENCOUNTER — Other Ambulatory Visit: Payer: Self-pay | Admitting: *Deleted

## 2019-09-04 NOTE — Patient Outreach (Signed)
Successful telephone outreach. [name] is doing well. She denies any SOB, or edema today. Her wt was 134 today with her clothes on.  She has an upcoming appt to see Dr. Inda Merlin.   Her son calls her every morning and checks in.  Her care plan goals have all been met. I will call her again in one month and then close her case.  THN CM Care Plan Problem One     Most Recent Value  Care Plan Problem One  Patient at high risk for readmission due to co-morbidities.  (Pended)   Role Documenting the Problem One  Care Management Telephonic Coordinator  (Pended)   [place] for Problem One  Active  (Pended)   THN Long Term Goal   Patient will have no readmission to hospital over the next 30 days.  (Pended)   THN Long Term Goal Start Date  07/20/19  (Pended)   THN Long Term Goal Met Date  09/04/19  (Pended)   THN CM Short Term Goal #1   Patient will complete all MD follow up appts over the course of the next 2-3 weeks.  (Pended)   THN CM Short Term Goal #1 Start Date  07/20/19  (Pended)   THN CM Short Term Goal #1 Met Date  08/20/19  (Pended)   THN CM Short Term Goal #2   Patient will regain prior hospitalization functional status and be able to live independently.  (Pended)   THN CM Short Term Goal #2 Start Date  07/20/19  (Pended)   THN CM Short Term Goal #2 Met Date  08/20/19  (Pended)      Betty [name] Neither, MSN, [facility] Gerontological Nurse Practitioner [facility] Care Management [phone]

## 2019-09-08 DIAGNOSIS — E559 Vitamin D deficiency, unspecified: Secondary | ICD-10-CM | POA: Diagnosis not present

## 2019-09-08 DIAGNOSIS — I2510 Atherosclerotic heart disease of native coronary artery without angina pectoris: Secondary | ICD-10-CM | POA: Diagnosis not present

## 2019-09-08 DIAGNOSIS — Z79899 Other long term (current) drug therapy: Secondary | ICD-10-CM | POA: Diagnosis not present

## 2019-09-08 DIAGNOSIS — M109 Gout, unspecified: Secondary | ICD-10-CM | POA: Diagnosis not present

## 2019-09-08 DIAGNOSIS — I5020 Unspecified systolic (congestive) heart failure: Secondary | ICD-10-CM | POA: Diagnosis not present

## 2019-09-08 DIAGNOSIS — K922 Gastrointestinal hemorrhage, unspecified: Secondary | ICD-10-CM | POA: Diagnosis not present

## 2019-09-08 DIAGNOSIS — N184 Chronic kidney disease, stage 4 (severe): Secondary | ICD-10-CM | POA: Diagnosis not present

## 2019-09-08 DIAGNOSIS — F0150 Vascular dementia without behavioral disturbance: Secondary | ICD-10-CM | POA: Diagnosis not present

## 2019-09-08 DIAGNOSIS — K2091 Esophagitis, unspecified with bleeding: Secondary | ICD-10-CM | POA: Diagnosis not present

## 2019-09-25 ENCOUNTER — Other Ambulatory Visit: Payer: Self-pay | Admitting: *Deleted

## 2019-09-25 NOTE — Patient Outreach (Signed)
Telephone outreach to follow up and possibly close Betty [name]'s case providing she continues to progress and feel well.  Left message on home and cell phone #s  Kayleen Memos C. Myrtie Neither, MSN, [facility] Gerontological Nurse Practitioner [facility] Care Management [phone]

## 2019-09-28 ENCOUNTER — Telehealth (HOSPITAL_COMMUNITY): Payer: Self-pay

## 2019-09-28 ENCOUNTER — Other Ambulatory Visit: Payer: Self-pay | Admitting: *Deleted

## 2019-09-28 ENCOUNTER — Encounter: Payer: Self-pay | Admitting: *Deleted

## 2019-09-28 NOTE — Progress Notes (Signed)
PATIENT: Betty [name] DOB: August 29, 1946  REASON FOR VISIT: follow up HISTORY FROM: patient  HISTORY OF PRESENT ILLNESS: Today 09/29/19  Betty [name] is a 73 year old female with history of renal insufficiency and progressive memory problem.  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  She currently has a pacemaker.  She was admitted to the hospital in July with Covid requiring mechanical intubation, she was discharged to [facility] rehabilitation.  She was re-admitted for a GI bleed.  She is here today with her son.  She reports she has recovered quite well from being so ill. She has a lapse in her memory, where she doesn't remember Christmas 2019, up until a few months ago.  She lives alone.  She performs all of her own ADLs.  Her son lives nearby, and checks in on her.  He prepares her medications, and cooks for her.  She uses a cane, just in case when she goes out, because she has gout.  She reports she sleeps well.  She denies any hallucinations.  She has not had repeat scan to evaluate the aneurysm with Dr. Estanislado Pandy, as this was delayed due to her being ill with COVID.  She presents for follow-up company by her son.  HISTORY  03/23/2019 SS: Betty [name] is a 73 year old female with history of renal insufficiency and a progressive memory problem.  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  She currently has a pacemaker. After meeting in December 2019 with Dr. Patrecia Pour, she has decided that the benefit of aneurysm repair does not outweigh the risks involved unless aneurysm grows. She will have MRA (non-contrast) around June 2020.   She reports her memory is much better.  She now has a pacemaker.  She does live alone, her son is nearby.  She reports that she does drive a car short distances only.  She does not drive at night.  She reports that she does not do much cooking.  Her son will  bring her food.  She says she has a good appetite, she enjoys eating, denies any weight loss.  Her son manages her medications.  She reports that she sleeps well at night.  She denies any falls, she has been using a cane as of recently due to a gout flare.  She says before the gout she was very active, would frequently walk with her son.  Her son manages her finances, but she reports that she has the money.  She reports continued trouble with her short-term memory, but her long-term memory is excellent.  She says that since her hospitalization last year her memory has greatly improved and she is feeling much better.  REVIEW OF SYSTEMS: Out of a complete 14 system review of symptoms, the patient complains only of the following symptoms, and all other reviewed systems are negative.  Memory loss  ALLERGIES: Allergies  Allergen Reactions   Sulfa Antibiotics Itching    HOME MEDICATIONS: Outpatient Medications Prior to Visit  Medication Sig Dispense Refill   acetaminophen (TYLENOL) 325 MG tablet Take 650 mg by mouth every 6 (six) hours as needed for mild pain or moderate pain.     aspirin EC 81 MG tablet Take 81 mg by mouth at bedtime.     atorvastatin (LIPITOR) 40 MG tablet TAKE 1 TABLET BY MOUTH ONCE DAILY AT 6 PM (Patient taking differently: Take  40 mg by mouth daily at 6 PM. ) 90 tablet 1   Cholecalciferol (VITAMIN D) 2000 units tablet Take 2,000 Units by mouth at bedtime.     donepezil (ARICEPT) 10 MG tablet Take 1 tablet (10 mg total) by mouth at bedtime. 30 tablet 5   febuxostat (ULORIC) 40 MG tablet Take 40 mg by mouth at bedtime.      furosemide (LASIX) 40 MG tablet Take 1 tablet (40 mg total) by mouth daily. Please make yearly appt with Dr. Caryl Comes for October before anymore refills. 1st attempt 30 tablet 0   HYDROcodone-acetaminophen (NORCO/VICODIN) 5-325 MG tablet Take 1-2 tablets by mouth every 4 (four) hours as needed for moderate pain. 10 tablet 0   hydrOXYzine  (ATARAX/VISTARIL) 10 MG tablet Take 10 mg by mouth 2 (two) times daily. Anxiety     isosorbide-hydrALAZINE (BIDIL) 20-37.5 MG tablet Take 1 tablet by mouth 2 (two) times daily. 180 tablet 3   metoprolol succinate (TOPROL-XL) 100 MG 24 hr tablet Take 1 tablet (100 mg total) by mouth daily. Please make yearly appt with Dr. Tamala Julian for November before anymore refills. 1st attempt 90 tablet 0   Multiple Vitamin (MULTIVITAMIN WITH MINERALS) TABS tablet Take 0.5 tablets by mouth at bedtime.     nitroGLYCERIN (NITROSTAT) 0.4 MG SL tablet Place 1 tablet (0.4 mg total) under the tongue every 5 (five) minutes x 3 doses as needed for chest pain. 25 tablet 12   ondansetron (ZOFRAN) 4 MG tablet Take 1 tablet (4 mg total) by mouth every 6 (six) hours as needed for nausea. 20 tablet 0   pantoprazole (PROTONIX) 40 MG tablet Take 1 tablet (40 mg total) by mouth 2 (two) times daily. 60 tablet 0   sodium bicarbonate 650 MG tablet Take 1 tablet (650 mg total) by mouth 2 (two) times daily. 30 tablet 0   albuterol (VENTOLIN HFA) 108 (90 Base) MCG/ACT inhaler Inhale 2 puffs into the lungs every 6 (six) hours as needed for wheezing or shortness of breath.     LORazepam (ATIVAN) 0.5 MG tablet Take 0.5 mg by mouth every 8 (eight) hours as needed for anxiety.     No facility-administered medications prior to visit.     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 ([place])    Acute mastitis of right breast 10/2003   Arthritis    bursitis in right shoulder (05/21/2018)   Cerebral aneurysm, nonruptured 07/16/2018   Right anterior communicating artery   CKD (chronic kidney disease), stage IV (HCC)    Colon polyps    COPD (chronic obstructive pulmonary disease) (HCC)    emphysema   Dementia ([place]) 07/16/2018   Family history of breast cancer    GERD (gastroesophageal reflux disease)    Hypercholesterolemia    Hypertension     Non-small cell carcinoma of right lung, stage 1 ([place]) 05/17/2015   Stage IB, right upper lobectomy 04/29/15, chemo    NSTEMI (non-ST elevated myocardial infarction) ([place]) 03/24/2018   Presence of permanent cardiac pacemaker 05/21/2018   Pure hypercholesterolemia    Sciatica of right side    Seasonal allergic rhinitis    Tobacco dependence    Tubular adenoma of colon    Vitamin D deficiency     PAST SURGICAL HISTORY: Past Surgical History:  Procedure Laterality Date   BIOPSY  06/26/2019   Procedure: BIOPSY;  Surgeon: Wilford Corner, MD;  Location: [place];  Service: Endoscopy;;  BIV PACEMAKER INSERTION CRT-P N/A 05/21/2018   Procedure: BIV PACEMAKER INSERTION CRT-P;  Surgeon: Deboraha Sprang, MD;  Location: [place] [facility];  Service: Cardiovascular;  Laterality: N/A;   BREAST SURGERY     small mass removed from left breast--benign   CRYO INTERCOSTAL NERVE BLOCK Right 04/29/2015   Procedure: CRYO INTERCOSTAL NERVE BLOCK;  Surgeon: [name], MD;  Location: [place];  Service: Thoracic;  Laterality: Right;   ESOPHAGOGASTRODUODENOSCOPY (EGD) WITH PROPOFOL N/A 06/26/2019   Procedure: ESOPHAGOGASTRODUODENOSCOPY (EGD) WITH PROPOFOL;  Surgeon: Wilford Corner, MD;  Location: [facility];  Service: Endoscopy;  Laterality: N/A;   INSERT / REPLACE / REMOVE PACEMAKER  05/21/2018   LOBECTOMY Right 04/29/2015   Procedure: RIGHT LOWER LUNG LOBECTOMY ;  Surgeon: [name], MD;  Location: [place];  Service: Thoracic;  Laterality: Right;   LYMPH NODE DISSECTION Right 04/29/2015   Procedure: RIGHT LUNG LYMPH NODE DISSECTION;  Surgeon: [name], MD;  Location: [place];  Service: Thoracic;  Laterality: Right;   RIGHT/LEFT HEART CATH AND CORONARY ANGIOGRAPHY N/A 04/24/2018   Procedure: RIGHT/LEFT HEART CATH AND CORONARY ANGIOGRAPHY;  Surgeon: [name], MD;  Location: [place] [facility];  Service: Cardiovascular;  Laterality: N/A;   VAGINAL  DELIVERY     51 yrs ago   VIDEO ASSISTED THORACOSCOPY (VATS)/ LOBECTOMY Right 04/29/2015   Procedure: RIGHT VIDEO ASSISTED THORACOSCOPY (VATS) WEDGE RESECTION/ RIGHT LOWER LOBECTOMY, CRYO-ANALGESIA OF INTERCOSTAL NERVES;  Surgeon: [name], MD;  Location: [facility];  Service: Thoracic;  Laterality: Right;    FAMILY HISTORY: Family History  Problem Relation Age of Onset   Cancer Father    Cancer Sister    Heart attack Brother    Dementia Brother    Other Son        bicuspid aortic valve   Heart attack Brother    Cancer Sister     SOCIAL HISTORY: Social History   Socioeconomic History   Marital status: Single    Spouse name: Not on file   Number of children: 1   Years of education: 13   Highest education level: Not on file  Occupational History   Occupation: Retired  Scientist, product/process development strain: Not hard at all   Food insecurity    Worry: Patient refused    Inability: Patient refused   Transportation needs    Medical: No    Non-medical: No  Tobacco Use   Smoking status: Former Smoker    Packs/day: 0.75    Years: 47.00    Pack years: 35.25    Types: Cigarettes    Quit date: 04/29/2015    Years since quitting: 4.4   Smokeless tobacco: Never Used  Substance and Sexual Activity   Alcohol use: Yes    Comment: 05/21/2018 "maybe 6 drinks/year"   Drug use: Never   Sexual activity: Not on file  Lifestyle   Physical activity    Days per week: Patient refused    Minutes per session: Patient refused   Stress: Only a little  Relationships   Social connections    Talks on phone: Patient refused    Gets together: Patient refused    Attends religious service: Patient refused    Active member of club or organization: Patient refused    Attends meetings of clubs or organizations: Patient refused    Relationship status: Patient refused   Intimate partner violence    Fear of current or ex partner: Patient refused  Emotionally  abused: Patient refused    Physically abused: Patient refused    Forced sexual activity: Patient refused  Other Topics Concern   Not on file  Social History Narrative   Lives alone   Caffeine use: 1 cup coffee per day   Right handed    PHYSICAL EXAM  Vitals:   09/29/19 1402  BP: (!) 150/73  Pulse: 72  Temp: (!) 97.5 F (36.4 C)  Weight: 129 lb 6.4 oz (58.7 kg)  Height: 5' 3.5" (1.613 m)   Body mass index is 22.56 kg/m.  Generalized: Well developed, in no acute distress  MMSE - Mini Mental State Exam 09/29/2019 07/16/2018 03/29/2018  Orientation to time 2 3 0  Orientation to Place 4 4 0  Registration 3 3 3   Attention/ Calculation 1 5 5   Recall 0 0 1  Language- name 2 objects 2 2 2   Language- repeat 1 1 1   Language- follow 3 step command 3 3 3   Language- read & follow direction 1 1 1   Write a sentence 1 1 0  Copy design 1 1 0  Copy design-comments named 10 animals - -  Total score 19 24 16     Neurological examination  Mentation: Alert oriented to time, place, most history is provided by her son. Follows all commands speech and language fluent Cranial nerve II-XII: Pupils were equal round reactive to light. Extraocular movements were full, visual field were full on confrontational test. Facial sensation and strength were normal. Head turning and shoulder shrug  were normal and symmetric. Motor: The motor testing reveals 5 over 5 strength of all 4 extremities. Good symmetric motor tone is noted throughout.  Sensory: Sensory testing is intact to soft touch on all 4 extremities. No evidence of extinction is noted.  Coordination: Cerebellar testing reveals good finger-nose-finger and heel-to-shin bilaterally.  Gait and station: Gait is normal.  Reflexes: Deep tendon reflexes are symmetric and normal bilaterally.   DIAGNOSTIC DATA (LABS, IMAGING, TESTING) - I reviewed patient records, labs, notes, testing and imaging myself where available.  Lab Results  Component Value  Date   WBC 9.4 08/11/2019   HGB 12.1 08/11/2019   HCT 37.2 08/11/2019   MCV 91.9 08/11/2019   PLT 302 08/11/2019      Component Value Date/Time   NA 145 08/11/2019 1108   NA 143 06/02/2018 1505   NA 145 09/18/2016 1057   K 3.1 (L) 08/11/2019 1108   K 4.9 09/18/2016 1057   CL 110 08/11/2019 1108   CO2 22 08/11/2019 1108   CO2 22 09/18/2016 1057   GLUCOSE 108 (H) 08/11/2019 1108   GLUCOSE 93 09/18/2016 1057   BUN 23 08/11/2019 1108   BUN 28 (H) 06/02/2018 1505   BUN 39.6 (H) 09/18/2016 1057   CREATININE 2.33 (H) 08/11/2019 1108   CREATININE 2.8 (H) 09/18/2016 1057   CALCIUM 9.0 08/11/2019 1108   CALCIUM 9.8 09/18/2016 1057   PROT 6.8 08/11/2019 1108   PROT 7.6 09/18/2016 1057   ALBUMIN 3.6 08/11/2019 1108   ALBUMIN 3.6 09/18/2016 1057   AST 17 08/11/2019 1108   AST 19 09/18/2016 1057   ALT 10 08/11/2019 1108   ALT 12 09/18/2016 1057   ALKPHOS 74 08/11/2019 1108   ALKPHOS 123 09/18/2016 1057   BILITOT 0.6 08/11/2019 1108   BILITOT 0.35 09/18/2016 1057   GFRNONAA 20 (L) 08/11/2019 1108   GFRAA 23 (L) 08/11/2019 1108   Lab Results  Component Value Date   CHOL 175  03/25/2018   HDL 39 (L) 03/25/2018   LDLCALC 115 (H) 03/25/2018   TRIG 103 03/25/2018   CHOLHDL 4.5 03/25/2018   Lab Results  Component Value Date   HGBA1C 6.9 (H) 05/16/2019   No results found for: VITAMINB12 No results found for: TSH  ASSESSMENT AND PLAN 73 y.o. 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 ([place]), Acute mastitis of right breast (10/2003), Arthritis, Cerebral aneurysm, nonruptured (07/16/2018), CKD (chronic kidney disease), stage IV ([place]), Colon polyps, COPD (chronic obstructive pulmonary disease) ([place]), Dementia ([place]) (07/16/2018), Family history of breast cancer, GERD (gastroesophageal reflux disease), Hypercholesterolemia, Hypertension, Non-small cell carcinoma of right lung, stage 1 ([place]) (05/17/2015), NSTEMI (non-ST elevated  myocardial infarction) ([place]) (03/24/2018), Presence of permanent cardiac pacemaker (05/21/2018), Pure hypercholesterolemia, Sciatica of right side, Seasonal allergic rhinitis, Tobacco dependence, Tubular adenoma of colon, and Vitamin D deficiency. here with:  1.  Memory loss 2.  Right anterior communicating artery aneurysm  Since last seen, she was very ill with COVID-19 requiring intubation.  She has recovered remarkably well, and has essentially returned to baseline.  She has had some decline in her memory score, she has a lapse in her memory for the last 6-9 months.  She will remain on Aricept.  I will start Namenda titration.  We discussed ordering repeat scan to evaluate aneurysm, they would like to contact Dr. Estanislado Pandy regarding this.  After meeting in December 2019, it was decided that the benefit of aneurysm repair did not outweigh the risk involved unless the aneurysm grew.  She was to have MRA (noncontrast) around June 2020. This was delayed due to the [place] pandemic.  Her son is to contact me, as I can assist with ordering the scan, if they run into delay.  She will follow-up in 6 months or sooner if needed.  I did advise if her symptoms worsen or she develops any new symptoms she should let us know.  MRI of the brain 04/25/2018 IMPRESSION: 1. No acute intracranial process. 2. 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 For constellation findings, recommend CTA HEAD and neck. 3. Moderate global parenchymal brain volume loss. 4. Moderate chronic small vessel ischemic changes. 5. At least moderate C4-5 canal stenosis .  I spent 25 minutes with the patient. 50% of this time was spent discussing her plan of care.  Butler Denmark, AGNP-C, DNP 09/29/2019, 2:14 PM [facility] [address], [place], [place] [address] [phone]

## 2019-09-28 NOTE — Patient Outreach (Signed)
Telephone outreach unsuccessful. Left a message on pt son's mobile number, and requested that he let his mother know I would like her to call me.  Pt home phone was not set up for voice mail message.  Eulah Pont. Myrtie Neither, MSN, GNP-BC Gerontological Nurse Practitioner [facility] Care Management [phone]  Pt did not return my call today. In light of our last conversation that she had met her goals and that we were planning on this being her case closure and my inability to contact her on my last 2 calls, I am closing her case and sending her a letter to advise.  Eulah Pont. Myrtie Neither, MSN, [facility] Gerontological Nurse Practitioner [facility] Care Management [phone]

## 2019-09-28 NOTE — Telephone Encounter (Signed)
Called to schedule f/u mra, no answer, no vm. AW

## 2019-09-29 ENCOUNTER — Other Ambulatory Visit: Payer: Self-pay

## 2019-09-29 ENCOUNTER — Encounter: Payer: Self-pay | Admitting: Neurology

## 2019-09-29 ENCOUNTER — Ambulatory Visit (INDEPENDENT_AMBULATORY_CARE_PROVIDER_SITE_OTHER): Payer: Medicare Other | Admitting: Neurology

## 2019-09-29 VITALS — BP 150/73 | HR 72 | Temp 97.5°F | Ht 63.5 in | Wt 129.4 lb

## 2019-09-29 DIAGNOSIS — I671 Cerebral aneurysm, nonruptured: Secondary | ICD-10-CM | POA: Diagnosis not present

## 2019-09-29 DIAGNOSIS — F0150 Vascular dementia without behavioral disturbance: Secondary | ICD-10-CM

## 2019-09-29 MED ORDER — DONEPEZIL HCL 10 MG PO TABS: 10.0000 mg | ORAL_TABLET | Freq: Every day | ORAL | 1 refills | Status: DC

## 2019-09-29 MED ORDER — MEMANTINE HCL 5 MG PO TABS: 5.0000 mg | ORAL_TABLET | Freq: Two times a day (BID) | ORAL | 0 refills | Status: DC

## 2019-09-29 NOTE — Patient Instructions (Signed)
Continue Aricept. Stop Namenda for memory.    Take 1 tablet daily for one week, then take 1 tablet twice daily for one week, then take 1 tablet in the morning and 2 in the evening for one week, then take 2 tablets twice daily  Please contact Dr. Patrecia Pour about repeating the MRA. Please contact us if you run into problems.  Memantine Tablets What is this medicine? MEMANTINE (MEM an teen) is used to treat dementia caused by Alzheimer's disease. This medicine may be used for other purposes; ask your health care provider or pharmacist if you have questions. COMMON BRAND NAME(S): Namenda What should I tell my health care provider before I take this medicine? They need to know if you have any of these conditions:  difficulty passing urine  kidney disease  liver disease  seizures  an unusual or allergic reaction to memantine, other medicines, foods, dyes, or preservatives  pregnant or trying to get pregnant  breast-feeding How should I use this medicine? Take this medicine by mouth with a glass of water. Follow the directions on the prescription label. You may take this medicine with or without food. Take your doses at regular intervals. Do not take your medicine more often than directed. Continue to take your medicine even if you feel better. Do not stop taking except on the advice of your doctor or health care professional. Talk to your pediatrician regarding the use of this medicine in children. Special care may be needed. Overdosage: If you think you have taken too much of this medicine contact a poison control center or emergency room at once. NOTE: This medicine is only for you. Do not share this medicine with others. What if I miss a dose? If you miss a dose, take it as soon as you can. If it is almost time for your next dose, take only that dose. Do not take double or extra doses. If you do not take your medicine for several days, contact your health care provider. Your dose may  need to be changed. What may interact with this medicine?  acetazolamide  amantadine  cimetidine  dextromethorphan  dofetilide  hydrochlorothiazide  ketamine  metformin  methazolamide  quinidine  ranitidine  sodium bicarbonate  triamterene This list may not describe all possible interactions. Give your health care provider a list of all the medicines, herbs, non-prescription drugs, or dietary supplements you use. Also tell them if you smoke, drink alcohol, or use illegal drugs. Some items may interact with your medicine. What should I watch for while using this medicine? Visit your doctor or health care professional for regular checks on your progress. Check with your doctor or health care professional if there is no improvement in your symptoms or if they get worse. You may get drowsy or dizzy. Do not drive, use machinery, or do anything that needs mental alertness until you know how this drug affects you. Do not stand or sit up quickly, especially if you are an older patient. This reduces the risk of dizzy or fainting spells. Alcohol can make you more drowsy and dizzy. Avoid alcoholic drinks. What side effects may I notice from receiving this medicine? Side effects that you should report to your doctor or health care professional as soon as possible:  allergic reactions like skin rash, itching or hives, swelling of the face, lips, or tongue  agitation or a feeling of restlessness  depressed mood  dizziness  hallucinations  redness, blistering, peeling or loosening of the skin, including  inside the mouth  seizures  vomiting Side effects that usually do not require medical attention (report to your doctor or health care professional if they continue or are bothersome):  constipation  diarrhea  headache  nausea  trouble sleeping This list may not describe all possible side effects. Call your doctor for medical advice about side effects. You may report side  effects to FDA at 1-800-FDA-1088. Where should I keep my medicine? Keep out of the reach of children. Store at room temperature between 15 degrees and 30 degrees C (59 degrees and 86 degrees F). Throw away any unused medicine after the expiration date. NOTE: This sheet is a summary. It may not cover all possible information. If you have questions about this medicine, talk to your doctor, pharmacist, or health care provider.  2020 Elsevier/Gold Standard (2013-08-10 14:10:42)

## 2019-09-29 NOTE — Progress Notes (Signed)
I have read the note, and I agree with the clinical assessment and plan.  Betty [name]   

## 2019-10-05 ENCOUNTER — Other Ambulatory Visit: Payer: Self-pay

## 2019-10-05 ENCOUNTER — Ambulatory Visit (INDEPENDENT_AMBULATORY_CARE_PROVIDER_SITE_OTHER): Payer: Medicare Other | Admitting: Internal Medicine

## 2019-10-05 ENCOUNTER — Encounter: Payer: Self-pay | Admitting: Internal Medicine

## 2019-10-05 VITALS — BP 148/88 | HR 85 | Ht 63.5 in | Wt 130.4 lb

## 2019-10-05 DIAGNOSIS — I5022 Chronic systolic (congestive) heart failure: Secondary | ICD-10-CM | POA: Diagnosis not present

## 2019-10-05 DIAGNOSIS — I428 Other cardiomyopathies: Secondary | ICD-10-CM | POA: Diagnosis not present

## 2019-10-05 NOTE — Progress Notes (Signed)
Patient Care Team: Josetta Huddle, MD as PCP - General (Internal Medicine) Belva Crome, MD as PCP - Cardiology (Cardiology) Magdalen Spatz, NP as Nurse Practitioner (Nurse Practitioner) Cletis Athens, MD (Inactive) (Gastroenterology) [name], MD as Consulting Physician (Cardiothoracic Surgery)   HPI  Betty [name] is a 73 y.o. female Seen in follow-up for CRT-D implanted for nonischemic/ischemic cardiomyopathy congestive heart failure and left bundle branch block 7/19.  She was found also to have left bundle branch block new from 2016. Presented with congestive heart failure earlier 2019   Hospitalized 7/20 with Covid pneumonia.  Intubated on 2 occasions.  According to her son she is back to baseline.  Denies edema chest pain.  Mild shortness of breath.    DATE TEST EF   4/19 Echo   20 % Inf AK diffuse HK  5/19 Myoview 17% Without fixed defect  5/19 LHC  RCA 90% >>med Rx  11/20 Echo  30-35%    Date Cr K Hgb  10/20    2.33 3.1 12.1  11/20 2.72 4.6  13.1      son.  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 Acute combined systolic and diastolic (congestive) hrt fail ([place])   . Acute mastitis of right breast 10/2003  . Arthritis    bursitis in right shoulder (05/21/2018)  . 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 CKD (chronic kidney disease), stage IV ([place])   . Colon polyps   . 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 Dementia ([place]) 07/16/2018  . Family history of breast cancer   . GERD (gastroesophageal reflux disease)   . Hypercholesterolemia   . Hypertension   . Non-small cell carcinoma of right lung, stage 1 ([place]) 05/17/2015   Stage IB, right upper lobectomy 04/29/15, chemo   . NSTEMI (non-ST elevated myocardial infarction) ([place]) 03/24/2018  . Presence of permanent cardiac pacemaker 05/21/2018  . Pure  hypercholesterolemia   . Sciatica of right side   . Seasonal allergic rhinitis   . Tobacco dependence   . Tubular adenoma of colon   . Vitamin D deficiency     Past Surgical History:  Procedure Laterality Date  . BIOPSY  06/26/2019   Procedure: BIOPSY;  Surgeon: Wilford Corner, MD;  [facility]: [facility];  Service: Endoscopy;;  . BIV PACEMAKER INSERTION CRT-P N/A 05/21/2018   Procedure: BIV PACEMAKER INSERTION CRT-P;  Surgeon: Deboraha Sprang, MD;  Location: [place] CV LAB;  Service: Cardiovascular;  Laterality: N/A;  . BREAST SURGERY     small mass removed from left breast--benign  . CRYO INTERCOSTAL NERVE BLOCK Right 04/29/2015   Procedure: CRYO INTERCOSTAL NERVE BLOCK;  Surgeon: [name], MD;  Location: [place];  Service: Thoracic;  Laterality: Right;  . ESOPHAGOGASTRODUODENOSCOPY (EGD) WITH PROPOFOL N/A 06/26/2019   Procedure: ESOPHAGOGASTRODUODENOSCOPY (EGD) WITH PROPOFOL;  Surgeon: Wilford Corner, MD;  Location: [place];  Service: Endoscopy;  Laterality: N/A;  . INSERT / REPLACE / REMOVE PACEMAKER  05/21/2018  . LOBECTOMY Right 04/29/2015   Procedure: RIGHT LOWER LUNG LOBECTOMY ;  Surgeon: [name], MD;  Location: [place];  Service: Thoracic;  Laterality: Right;  . LYMPH NODE DISSECTION Right 04/29/2015   Procedure: RIGHT LUNG LYMPH NODE DISSECTION;  Surgeon: [name], MD;  Location: [place];  Service: Thoracic;  Laterality:  Right;  Marland Kitchen RIGHT/LEFT HEART CATH AND CORONARY ANGIOGRAPHY N/A 04/24/2018   Procedure: RIGHT/LEFT HEART CATH AND CORONARY ANGIOGRAPHY;  Surgeon: [name], MD;  Location: [place] CV LAB;  Service: Cardiovascular;  Laterality: N/A;  . VAGINAL DELIVERY     51 yrs ago  . VIDEO ASSISTED THORACOSCOPY (VATS)/ LOBECTOMY Right 04/29/2015   Procedure: RIGHT VIDEO ASSISTED THORACOSCOPY (VATS) WEDGE RESECTION/ RIGHT LOWER LOBECTOMY, CRYO-ANALGESIA OF INTERCOSTAL NERVES;  Surgeon: [name], MD;  Location: [facility] OR;   Service: Thoracic;  Laterality: Right;    Current Meds  Medication Sig  . acetaminophen (TYLENOL) 325 MG tablet Take 650 mg by mouth every 6 (six) hours as needed for mild pain or moderate pain.  Marland Kitchen aspirin EC 81 MG tablet Take 81 mg by mouth at bedtime.  Marland Kitchen atorvastatin (LIPITOR) 40 MG tablet TAKE 1 TABLET BY MOUTH ONCE DAILY AT 6 PM  . Cholecalciferol (VITAMIN D) 2000 units tablet Take 2,000 Units by mouth at bedtime.  . donepezil (ARICEPT) 10 MG tablet Take 1 tablet (10 mg total) by mouth at bedtime.  . febuxostat (ULORIC) 40 MG tablet Take 40 mg by mouth at bedtime.   . furosemide (LASIX) 20 MG tablet Take 20 mg by mouth daily.  Marland Kitchen HYDROcodone-acetaminophen (NORCO/VICODIN) 5-325 MG tablet Take 1-2 tablets by mouth every 4 (four) hours as needed for moderate pain.  . memantine (NAMENDA) 5 MG tablet Take 1 tablet (5 mg total) by mouth 2 (two) times daily. Take 1 tablet daily for one week, then take 1 tablet twice daily for one week, then take 1 tablet in the morning and 2 in the evening for one week, then take 2 tablets twice daily  . metoprolol succinate (TOPROL-XL) 25 MG 24 hr tablet Take 25 mg by mouth daily.  . Multiple Vitamin (MULTIVITAMIN WITH MINERALS) TABS tablet Take 0.5 tablets by mouth at bedtime.  . nitroGLYCERIN (NITROSTAT) 0.4 MG SL tablet Place 1 tablet (0.4 mg total) under the tongue every 5 (five) minutes x 3 doses as needed for chest pain.  Marland Kitchen omeprazole (PRILOSEC) 40 MG capsule Take 40 mg by mouth daily.  . sodium bicarbonate 650 MG tablet Take 1 tablet (650 mg total) by mouth 2 (two) times daily.    Allergies  Allergen Reactions  . Sulfa Antibiotics Itching      Review of Systems negative except from HPI and PMH  Physical Exam BP (!) 148/88   Pulse 85   Ht 5' 3.5" (1.613 m)   Wt 130 lb 6.4 oz (59.1 kg)   SpO2 98%   BMI 22.74 kg/m  Well developed and well nourished in no acute distress HENT normal Neck supple with JVP-flat Clear Device pocket well healed;  without hematoma or erythema.  There is no tethering  Regular rate and rhythm, no  murmur Abd-soft with active BS No Clubbing cyanosis   edema Skin-warm and dry A & Oriented  Grossly normal sensory and motor function repeats herself  ECG P-synchronous/ AV  Pacing @ 85 14/16/46 QRS upright lead 1 --lead V1  Chest x-ray was reviewed.  Interestingly, the lead appears quite lateral on the AP and looks to be rather basilar in the lateral.  There is an effusion following implantation 7/19 this was gone on chest x-ray 8/20   Assessment and Plan:  Nonischemic cardiomyopathy  Coronary artery disease-single-vessel (RCA)  Congestive heart failure-chronic-systolic-class IIb-III 80  Left bundle branch block greater than 150 ms  CRT- D [employer] The patient's device was  interrogated.  The information was reviewed. No changes were made in the programming.     Renal insufficiency grade 4  Dementia-mild  Non-small cell lung cancer status post lobectomy 2016 without known recurrence  Without symptoms of ischemia  Euvolemic continue current meds  Will defer to Dr [facility] re bidil       She is euvolemic. Virl Axe

## 2019-10-05 NOTE — Patient Instructions (Signed)
Medication Instructions:  Your physician recommends that you continue on your current medications as directed. Please refer to the Current Medication list given to you today.  *If you need a refill on your cardiac medications before your next appointment, please call your pharmacy*  Labwork: None ordered  Testing/Procedures: None ordered  Follow-Up: Remote monitoring is used to monitor your Pacemaker or ICD from home. This monitoring reduces the number of office visits required to check your device to one time per year. It allows [place] to keep an eye on the functioning of your device to ensure it is working properly. You are scheduled for a device check from home on 01/04/2020. You may send your transmission at any time that day. If you have a wireless device, the transmission will be sent automatically. After your physician reviews your transmission, you will receive a postcard with your next transmission date.  At [facility], you and your health needs are our priority.  As part of our continuing mission to provide you with exceptional heart care, we have created designated Provider Care Teams.  These Care Teams include your primary Cardiologist (physician) and Advanced Practice Providers (APPs -  Physician Assistants and Nurse Practitioners) who all work together to provide you with the care you need, when you need it.  You will need a follow up appointment in 12 months.  Please call our office 2 months in advance to schedule this appointment.  You may see Dr Caryl Comes or one of the following Advanced Practice Providers on your designated Care Team:    Chanetta Marshall, NP  Tommye Standard, PA-C  Oda Kilts, [place]  Thank you for choosing [facility]!!    Any Other Special Instructions Will Be Listed Below (If Applicable).

## 2019-10-06 ENCOUNTER — Other Ambulatory Visit (HOSPITAL_COMMUNITY): Payer: Self-pay | Admitting: Interventional Radiology

## 2019-10-06 DIAGNOSIS — I671 Cerebral aneurysm, nonruptured: Secondary | ICD-10-CM

## 2019-10-07 DIAGNOSIS — I129 Hypertensive chronic kidney disease with stage 1 through stage 4 chronic kidney disease, or unspecified chronic kidney disease: Secondary | ICD-10-CM | POA: Diagnosis not present

## 2019-10-07 DIAGNOSIS — D631 Anemia in chronic kidney disease: Secondary | ICD-10-CM | POA: Diagnosis not present

## 2019-10-07 DIAGNOSIS — N2581 Secondary hyperparathyroidism of renal origin: Secondary | ICD-10-CM | POA: Diagnosis not present

## 2019-10-07 DIAGNOSIS — N189 Chronic kidney disease, unspecified: Secondary | ICD-10-CM | POA: Diagnosis not present

## 2019-10-07 DIAGNOSIS — N184 Chronic kidney disease, stage 4 (severe): Secondary | ICD-10-CM | POA: Diagnosis not present

## 2019-10-07 DIAGNOSIS — F0390 Unspecified dementia without behavioral disturbance: Secondary | ICD-10-CM | POA: Diagnosis not present

## 2019-10-12 ENCOUNTER — Telehealth: Payer: Self-pay | Admitting: Interventional Cardiology

## 2019-10-12 NOTE — Telephone Encounter (Signed)
Pt does have memory issues.  Note placed in chart ok for son to accompany.

## 2019-10-12 NOTE — Progress Notes (Signed)
Cardiology Office Note:    Date:  10/13/2019   ID:  Betty [name], DOB 02/17/1946, MRN [id number]  PCP:  Josetta Huddle, MD  Cardiologist:  Sinclair Grooms, MD   Referring MD: Josetta Huddle, MD   Chief Complaint  Patient presents with  . Congestive Heart Failure    History of Present Illness:    Betty [name] is a 73 y.o. female with a hx of non-ischemic CM, chronic systolic heart failure, CKD 4, dementia, and CRT for LV preservation (LVEF 20 --> 30-35%, 06/2019).  COVID-19 infection requiring intubation x3 and 6-week hospital stay July 2020.  She has no complaints today.  She looks remarkably well.  Her son accompanies her.  She is still having cognitive impairment.  She does relatively well with remote memory but no significant improvement in recent memory.  This continues to be a problem making it difficult to care for her.  She denies orthopnea, chest pain, lower extremity swelling, syncope, and PND.  Appetite is been stable.  Past Medical History:  Diagnosis Date  . Acute combined systolic and diastolic (congestive) hrt fail ([place])   . Acute mastitis of right breast 10/2003  . Arthritis    bursitis in right shoulder (05/21/2018)  . 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 CKD (chronic kidney disease), stage IV ([place])   . Colon polyps   . 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 Dementia ([place]) 07/16/2018  . Family history of breast cancer   . GERD (gastroesophageal reflux disease)   . Hypercholesterolemia   . Hypertension   . Non-small cell carcinoma of right lung, stage 1 ([place]) 05/17/2015   Stage IB, right upper lobectomy 04/29/15, chemo   . NSTEMI (non-ST elevated myocardial infarction) ([place]) 03/24/2018  . Presence of permanent cardiac pacemaker 05/21/2018  . Pure hypercholesterolemia   . Sciatica of right side   . Seasonal allergic rhinitis   . Tobacco dependence   . Tubular adenoma of colon   . Vitamin  D deficiency     Past Surgical History:  Procedure Laterality Date  . BIOPSY  06/26/2019   Procedure: BIOPSY;  Surgeon: Wilford Corner, MD;  Location: [facility] ENDOSCOPY;  Service: Endoscopy;;  . BIV PACEMAKER INSERTION CRT-P N/A 05/21/2018   Procedure: BIV PACEMAKER INSERTION CRT-P;  Surgeon: Deboraha Sprang, MD;  Location: [place] CV LAB;  Service: Cardiovascular;  Laterality: N/A;  . BREAST SURGERY     small mass removed from left breast--benign  . CRYO INTERCOSTAL NERVE BLOCK Right 04/29/2015   Procedure: CRYO INTERCOSTAL NERVE BLOCK;  Surgeon: [name], MD;  Location: [place];  Service: Thoracic;  Laterality: Right;  . ESOPHAGOGASTRODUODENOSCOPY (EGD) WITH PROPOFOL N/A 06/26/2019   Procedure: ESOPHAGOGASTRODUODENOSCOPY (EGD) WITH PROPOFOL;  Surgeon: Wilford Corner, MD;  Location: [place];  Service: Endoscopy;  Laterality: N/A;  . INSERT / REPLACE / REMOVE PACEMAKER  05/21/2018  . LOBECTOMY Right 04/29/2015   Procedure: RIGHT LOWER LUNG LOBECTOMY ;  Surgeon: [name], MD;  Location: [place];  Service: Thoracic;  Laterality: Right;  . LYMPH NODE DISSECTION Right 04/29/2015   Procedure: RIGHT LUNG LYMPH NODE DISSECTION;  Surgeon: [name], MD;  Location: [place];  Service: Thoracic;  Laterality: Right;  . RIGHT/LEFT HEART CATH AND CORONARY ANGIOGRAPHY N/A 04/24/2018   Procedure: RIGHT/LEFT HEART CATH AND CORONARY ANGIOGRAPHY;  Surgeon: [name], MD;  Location: [place] CV LAB;  Service: Cardiovascular;  Laterality: N/A;  . VAGINAL DELIVERY     51 yrs ago  . VIDEO ASSISTED THORACOSCOPY (VATS)/ LOBECTOMY Right 04/29/2015   Procedure: RIGHT VIDEO ASSISTED THORACOSCOPY (VATS) WEDGE RESECTION/ RIGHT LOWER LOBECTOMY, CRYO-ANALGESIA OF INTERCOSTAL NERVES;  Surgeon: [name], MD;  Location: [place];  Service: Thoracic;  Laterality: Right;    Current Medications: Current Meds  Medication Sig  . acetaminophen (TYLENOL) 325 MG tablet Take 650  mg by mouth every 6 (six) hours as needed for mild pain or moderate pain.  Marland Kitchen aspirin EC 81 MG tablet Take 81 mg by mouth at bedtime.  Marland Kitchen atorvastatin (LIPITOR) 40 MG tablet TAKE 1 TABLET BY MOUTH ONCE DAILY AT 6 PM  . Cholecalciferol (VITAMIN D) 2000 units tablet Take 2,000 Units by mouth at bedtime.  . donepezil (ARICEPT) 10 MG tablet Take 1 tablet (10 mg total) by mouth at bedtime.  . febuxostat (ULORIC) 40 MG tablet Take 40 mg by mouth at bedtime.   . furosemide (LASIX) 20 MG tablet Take 20 mg by mouth daily.  Marland Kitchen HYDROcodone-acetaminophen (NORCO/VICODIN) 5-325 MG tablet Take 1-2 tablets by mouth every 4 (four) hours as needed for moderate pain.  . isosorbide-hydrALAZINE (BIDIL) 20-37.5 MG tablet Take 1 tablet by mouth 2 (two) times daily.  . memantine (NAMENDA) 5 MG tablet Take 1 tablet (5 mg total) by mouth 2 (two) times daily. Take 1 tablet daily for one week, then take 1 tablet twice daily for one week, then take 1 tablet in the morning and 2 in the evening for one week, then take 2 tablets twice daily  . Multiple Vitamin (MULTIVITAMIN WITH MINERALS) TABS tablet Take 0.5 tablets by mouth at bedtime.  . nitroGLYCERIN (NITROSTAT) 0.4 MG SL tablet Place 1 tablet (0.4 mg total) under the tongue every 5 (five) minutes x 3 doses as needed for chest pain.  Marland Kitchen omeprazole (PRILOSEC) 40 MG capsule Take 40 mg by mouth daily.  . sodium bicarbonate 650 MG tablet Take 1 tablet (650 mg total) by mouth 2 (two) times daily.  . [DISCONTINUED] metoprolol succinate (TOPROL-XL) 25 MG 24 hr tablet Take 25 mg by mouth daily.     Allergies:   Sulfa antibiotics   Social History   Socioeconomic History  . Marital status: Single    Spouse name: Not on file  . Number of children: 1  . Years of education: 84  . Highest education level: Not on file  Occupational History  . Occupation: Retired  Scientific laboratory technician  . Financial resource strain: Not hard at all  . Food insecurity    Worry: Patient refused    Inability:  Patient refused  . Transportation needs    Medical: No    Non-medical: No  Tobacco Use  . Smoking status: Former Smoker    Packs/day: 0.75    Years: 47.00    Pack years: 35.25    Types: Cigarettes    Quit date: 04/29/2015    Years since quitting: 4.4  . Smokeless tobacco: Never Used  Substance and Sexual Activity  . Alcohol use: Yes    Comment: 05/21/2018 "maybe 6 drinks/year"  . Drug use: Never  . Sexual activity: Not on file  Lifestyle  . Physical activity    Days per week: Patient refused    Minutes per session: Patient refused  . Stress: Only a little  Relationships  . Social connections    Talks on phone: Patient refused    Gets together: Patient refused    Attends  religious service: Patient refused    Active member of club or organization: Patient refused    Attends meetings of clubs or organizations: Patient refused    Relationship status: Patient refused  Other Topics Concern  . Not on file  Social History Narrative   Lives alone   Caffeine use: 1 cup coffee per day   Right handed      Family History: The patient's family history includes Cancer in her father, sister, and sister; Dementia in her brother; Heart attack in her brother and brother; Other in her son.  ROS:   Please see the history of present illness.    Decreased memory.  Has difficulty maintaining a coherent conversation without tangential joking.  All other systems reviewed and are negative.  EKGs/Labs/Other Studies Reviewed:    The following studies were reviewed today:  2 D-Doppler ECHOCARDIOGRAM 2020: IMPRESSIONS    1. The left ventricle has moderate-severely reduced systolic function, with an ejection fraction of 30-35%. The cavity size was normal. There is abnormal septal motion consistent with left bundle branch block. Left ventricular diffuse hypokinesis.  2. The right ventricle has normal systolc function. The cavity was normal. There is no increase in right ventricular wall  thickness. Right ventricular systolic pressure could not be assessed.  EKG:  EKG not performed  Recent Labs: 06/09/2019: B Natriuretic Peptide 80.7 07/02/2019: Magnesium 1.8 08/11/2019: ALT 10; BUN 23; Creatinine 2.33; Hemoglobin 12.1; Platelet Count 302; Potassium 3.1; Sodium 145  Recent Lipid Panel    Component Value Date/Time   CHOL 175 03/25/2018 0216   TRIG 103 03/25/2018 0216   HDL 39 (L) 03/25/2018 0216   CHOLHDL 4.5 03/25/2018 0216   VLDL 21 03/25/2018 0216   LDLCALC 115 (H) 03/25/2018 0216    Physical Exam:    VS:  BP (!) 152/68   Pulse 99   Ht 5' 3.5" (1.613 m)   Wt 125 lb 6.4 oz (56.9 kg)   SpO2 96%   BMI 21.87 kg/m     Wt Readings from Last 3 Encounters:  10/13/19 125 lb 6.4 oz (56.9 kg)  10/05/19 130 lb 6.4 oz (59.1 kg)  09/29/19 129 lb 6.4 oz (58.7 kg)     GEN: Appears younger than stated age. No acute distress HEENT: Normal NECK: No JVD. LYMPHATICS: No lymphadenopathy CARDIAC:  RRR without murmur, gallop, or edema. VASCULAR:  Normal Pulses. No bruits. RESPIRATORY:  Clear to auscultation without rales, wheezing or rhonchi  ABDOMEN: Soft, non-tender, non-distended, No pulsatile mass, MUSCULOSKELETAL: No deformity  SKIN: Warm and dry NEUROLOGIC: No obvious focal neurological deficits. PSYCHIATRIC: Almost inappropriately believe full.  ASSESSMENT:    1. Chronic systolic heart failure ([place])   2. Cognitive and neurobehavioral dysfunction   3. Nonischemic cardiomyopathy ([place])   4. Left bundle branch block   5. CKD (chronic kidney disease), stage IV ([place])   6. NSTEMI (non-ST elevated myocardial infarction) ([place])   7. Educated about COVID-19 virus infection    PLAN:    In order of problems listed above:  1. Uptitrate metoprolol to 25 mg twice daily.  On next visit may further increase to 100 mg daily.  On next visit depending upon blood pressure, may uptitrate BiDil to 3 times per day 2. Unchanged. 3. Uptitrating therapy to achieve a significant  guideline based regimen for preservation of LV function. 4. Status post resynchronization with improvement in LV function. 5. Will need to have a basic metabolic panel performed when she returns. 6. Not discussed and not  relevant 7. She is masking despite having prior infection with COVID-19.  Guideline directed therapy for left ventricular systolic dysfunction: Angiotensin receptor-neprilysin inhibitor (ARNI)-Entresto; beta-blocker therapy - carvedilol or metoprolol succinate; mineralocorticoid receptor antagonist (MRA) therapy -spironolactone or eplerenone.  These therapies have been shown to improve clinical outcomes including reduction of rehospitalization survival, and acute heart failure.  Therapy with ACE/ARB/Arni will not be possible related to kidney impairment.  May be able to use low-dose spironolactone.  Will do blood work at the next office visit to help determine if this is feasible.  SGLT2 therapy would also be a consideration although kidney function makes this problematic as well.    Medication Adjustments/Labs and Tests Ordered: Current medicines are reviewed at length with the patient today.  Concerns regarding medicines are outlined above.  No orders of the defined types were placed in this encounter.  Meds ordered this encounter  Medications  . metoprolol succinate (TOPROL-XL) 50 MG 24 hr tablet    Sig: Take 1 tablet (50 mg total) by mouth daily. Take with or immediately following a meal.    Dispense:  90 tablet    Refill:  3    Patient Instructions  Medication Instructions:  1) INCREASE Metoprolol Succinate to 50mg  once daily  *If you need a refill on your cardiac medications before your next appointment, please call your pharmacy*  Lab Work: BMET and CBC today  If you have labs (blood work) drawn today and your tests are completely normal, you will receive your results only by: Marland Kitchen MyChart Message (if you have MyChart) OR . A paper copy in the mail If you have  any lab test that is abnormal or we need to change your treatment, we will call you to review the results.  Testing/Procedures: None  Follow-Up: At [facility], you and your health needs are our priority.  As part of our continuing mission to provide you with exceptional heart care, we have created designated Provider Care Teams.  These Care Teams include your primary Cardiologist (physician) and Advanced Practice Providers (APPs -  Physician Assistants and Nurse Practitioners) who all work together to provide you with the care you need, when you need it.  Your next appointment:   2-3 month(s)  The format for your next appointment:   In Person  Provider:   You may see Sinclair Grooms, MD or one of the following Advanced Practice Providers on your designated Care Team:    Truitt Merle, NP  [name], NP  Kathyrn Drown, NP   Other Instructions      Signed, Sinclair Grooms, MD  10/13/2019 4:28 PM    [place]

## 2019-10-12 NOTE — Telephone Encounter (Signed)
New message   Son Chrissie Noa calling, states he must accompany his mother to 12/8 appointment, she has dementa

## 2019-10-13 ENCOUNTER — Encounter: Payer: Self-pay | Admitting: Interventional Cardiology

## 2019-10-13 ENCOUNTER — Ambulatory Visit (INDEPENDENT_AMBULATORY_CARE_PROVIDER_SITE_OTHER): Payer: Medicare Other | Admitting: Interventional Cardiology

## 2019-10-13 ENCOUNTER — Other Ambulatory Visit: Payer: Self-pay

## 2019-10-13 VITALS — BP 152/68 | HR 99 | Ht 63.5 in | Wt 125.4 lb

## 2019-10-13 DIAGNOSIS — I447 Left bundle-branch block, unspecified: Secondary | ICD-10-CM

## 2019-10-13 DIAGNOSIS — N184 Chronic kidney disease, stage 4 (severe): Secondary | ICD-10-CM

## 2019-10-13 DIAGNOSIS — F0789 Other personality and behavioral disorders due to known physiological condition: Secondary | ICD-10-CM | POA: Diagnosis not present

## 2019-10-13 DIAGNOSIS — I214 Non-ST elevation (NSTEMI) myocardial infarction: Secondary | ICD-10-CM | POA: Diagnosis not present

## 2019-10-13 DIAGNOSIS — I428 Other cardiomyopathies: Secondary | ICD-10-CM | POA: Diagnosis not present

## 2019-10-13 DIAGNOSIS — F09 Unspecified mental disorder due to known physiological condition: Secondary | ICD-10-CM | POA: Diagnosis not present

## 2019-10-13 DIAGNOSIS — Z7189 Other specified counseling: Secondary | ICD-10-CM | POA: Diagnosis not present

## 2019-10-13 DIAGNOSIS — I5022 Chronic systolic (congestive) heart failure: Secondary | ICD-10-CM

## 2019-10-13 MED ORDER — METOPROLOL SUCCINATE ER 50 MG PO TB24: 50.0000 mg | ORAL_TABLET | Freq: Every day | ORAL | 3 refills | Status: DC

## 2019-10-13 NOTE — Patient Instructions (Signed)
Medication Instructions:  1) INCREASE Metoprolol Succinate to 50mg  once daily  *If you need a refill on your cardiac medications before your next appointment, please call your pharmacy*  Lab Work: BMET and CBC today  If you have labs (blood work) drawn today and your tests are completely normal, you will receive your results only by: Marland Kitchen MyChart Message (if you have MyChart) OR . A paper copy in the mail If you have any lab test that is abnormal or we need to change your treatment, we will call you to review the results.  Testing/Procedures: None  Follow-Up: At [facility], you and your health needs are our priority.  As part of our continuing mission to provide you with exceptional heart care, we have created designated Provider Care Teams.  These Care Teams include your primary Cardiologist (physician) and Advanced Practice Providers (APPs -  Physician Assistants and Nurse Practitioners) who all work together to provide you with the care you need, when you need it.  Your next appointment:   2-3 month(s)  The format for your next appointment:   In Person  Provider:   You may see Sinclair Grooms, MD or one of the following Advanced Practice Providers on your designated Care Team:    Truitt Merle, NP  [name], NP  Kathyrn Drown, NP   Other Instructions

## 2019-10-19 ENCOUNTER — Ambulatory Visit (HOSPITAL_COMMUNITY): Admission: RE | Admit: 2019-10-19 | Payer: Medicare Other | Source: Ambulatory Visit

## 2019-10-19 ENCOUNTER — Encounter (HOSPITAL_COMMUNITY): Payer: Self-pay

## 2019-11-02 ENCOUNTER — Other Ambulatory Visit: Payer: Self-pay | Admitting: Internal Medicine

## 2019-11-02 ENCOUNTER — Other Ambulatory Visit: Payer: Self-pay

## 2019-11-02 ENCOUNTER — Other Ambulatory Visit: Payer: Self-pay | Admitting: *Deleted

## 2019-11-02 MED ORDER — MEMANTINE HCL 10 MG PO TABS: 10.0000 mg | ORAL_TABLET | Freq: Two times a day (BID) | ORAL | 3 refills | Status: DC

## 2019-11-02 MED ORDER — ATORVASTATIN CALCIUM 40 MG PO TABS: ORAL_TABLET | ORAL | 2 refills | Status: DC

## 2019-11-02 NOTE — Telephone Encounter (Signed)
I called and spoke to Chrissie Noa, son of pt.  She has tolerated the medication well and I relayed since that is so, wanted to let him know that the prescription will be made to 10mg  po bid (taking one tablet po bid).  He verbalized understanding.

## 2019-11-11 ENCOUNTER — Telehealth: Payer: Self-pay

## 2019-11-11 NOTE — Telephone Encounter (Signed)
Spoke with patient to remind of missed remote transmission 

## 2019-11-30 ENCOUNTER — Encounter: Payer: Self-pay | Admitting: Internal Medicine

## 2019-11-30 ENCOUNTER — Telehealth: Payer: Self-pay

## 2019-11-30 NOTE — Telephone Encounter (Signed)
Pt son Hal Hope states the pt never received her new monitor. I told him I will call [employer] to see why they have not received the new monitor and if one is not ordered I will order one. I told him I will give him a call back tomorrow.

## 2019-11-30 NOTE — Telephone Encounter (Signed)
Error

## 2019-12-01 NOTE — Telephone Encounter (Signed)
I ordered the pt a new monitor and called the pt son to let him know it will be received in 3-5 business days. The pt son thanked me for my help.I told him when he receive the monitor to call me and I will be more than happy to help him send a transmission with that new monitor.

## 2019-12-07 NOTE — Telephone Encounter (Signed)
The pt son states she did receive her new transmitter. He agreed to send a manual transmission we he go see her.

## 2020-01-10 NOTE — Progress Notes (Signed)
Cardiology Office Note:    Date:  01/11/2020   ID:  Betty [name], DOB 06/18/1946, MRN [id number]  PCP:  Betty Huddle, MD  Cardiologist:  Betty Grooms, MD   Referring MD: Betty Huddle, MD   Chief Complaint  Patient presents with  . Congestive Heart Failure    History of Present Illness:    Betty [name] is a 74 y.o. female with a hx of non-ischemic CM, chronic systolic heart failure, CKD 4, dementia, and CRT-P 05/2018 for LV preservation (LVEF 20 --> 30-35%, 06/2019).  COVID-19 infection requiring intubation x3 and 6-week hospital stay July 2020.  She is doing well.  She denies angina.  She denies orthopnea, PND, and lower extremity swelling.  No wheezing or cough.  No angina.  Legs get weak when she walks.  She walks with her son every day.  No medication side effects.  Past Medical History:  Diagnosis Date  . Acute combined systolic and diastolic (congestive) hrt fail ([place])   . Acute mastitis of right breast 10/2003  . Arthritis    bursitis in right shoulder (05/21/2018)  . 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 CKD (chronic kidney disease), stage IV ([place])   . Colon polyps   . 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 Dementia ([employer]) 07/16/2018  . Family history of breast cancer   . GERD (gastroesophageal reflux disease)   . Hypercholesterolemia   . Hypertension   . Non-small cell carcinoma of right lung, stage 1 ([place]) 05/17/2015   Stage IB, right upper lobectomy 04/29/15, chemo   . NSTEMI (non-ST elevated myocardial infarction) ([place]) 03/24/2018  . Presence of permanent cardiac pacemaker 05/21/2018  . Pure hypercholesterolemia   . Sciatica of right side   . Seasonal allergic rhinitis   . Tobacco dependence   . Tubular adenoma of colon   . Vitamin D deficiency     Past Surgical History:  Procedure Laterality Date  . BIOPSY  06/26/2019   Procedure: BIOPSY;  Surgeon: Wilford Corner, MD;  Location:  [facility] ENDOSCOPY;  Service: Endoscopy;;  . BIV PACEMAKER INSERTION CRT-P N/A 05/21/2018   Procedure: BIV PACEMAKER INSERTION CRT-P;  Surgeon: Deboraha Sprang, MD;  Location: [place] CV LAB;  Service: Cardiovascular;  Laterality: N/A;  . BREAST SURGERY     small mass removed from left breast--benign  . CRYO INTERCOSTAL NERVE BLOCK Right 04/29/2015   Procedure: CRYO INTERCOSTAL NERVE BLOCK;  Surgeon: [name], MD;  Location: [place];  Service: Thoracic;  Laterality: Right;  . ESOPHAGOGASTRODUODENOSCOPY (EGD) WITH PROPOFOL N/A 06/26/2019   Procedure: ESOPHAGOGASTRODUODENOSCOPY (EGD) WITH PROPOFOL;  Surgeon: Wilford Corner, MD;  Location: [place];  Service: Endoscopy;  Laterality: N/A;  . INSERT / REPLACE / REMOVE PACEMAKER  05/21/2018  . LOBECTOMY Right 04/29/2015   Procedure: RIGHT LOWER LUNG LOBECTOMY ;  Surgeon: [name], MD;  Location: [place];  Service: Thoracic;  Laterality: Right;  . LYMPH NODE DISSECTION Right 04/29/2015   Procedure: RIGHT LUNG LYMPH NODE DISSECTION;  Surgeon: [name], MD;  Location: [place];  Service: Thoracic;  Laterality: Right;  . RIGHT/LEFT HEART CATH AND CORONARY ANGIOGRAPHY N/A 04/24/2018   Procedure: RIGHT/LEFT HEART CATH AND CORONARY ANGIOGRAPHY;  Surgeon: [name], MD;  Location: [place] CV LAB;  Service: Cardiovascular;  Laterality: N/A;  . VAGINAL DELIVERY     51 yrs ago  . VIDEO ASSISTED THORACOSCOPY (VATS)/ LOBECTOMY Right  04/29/2015   Procedure: RIGHT VIDEO ASSISTED THORACOSCOPY (VATS) WEDGE RESECTION/ RIGHT LOWER LOBECTOMY, CRYO-ANALGESIA OF INTERCOSTAL NERVES;  Surgeon: [name], MD;  Location: [place];  Service: Thoracic;  Laterality: Right;    Current Medications: Current Meds  Medication Sig  . acetaminophen (TYLENOL) 325 MG tablet Take 650 mg by mouth every 6 (six) hours as needed for mild pain or moderate pain.  Marland Kitchen aspirin EC 81 MG tablet Take 81 mg by mouth at bedtime.  Marland Kitchen atorvastatin  (LIPITOR) 40 MG tablet TAKE 1 TABLET BY MOUTH ONCE DAILY AT 6 PM  . Cholecalciferol (VITAMIN D) 2000 units tablet Take 2,000 Units by mouth at bedtime.  . donepezil (ARICEPT) 10 MG tablet Take 1 tablet (10 mg total) by mouth at bedtime.  . febuxostat (ULORIC) 40 MG tablet Take 40 mg by mouth at bedtime.   . furosemide (LASIX) 20 MG tablet Take 20 mg by mouth daily.  . isosorbide-hydrALAZINE (BIDIL) 20-37.5 MG tablet Take 1 tablet by mouth 3 (three) times daily.  . memantine (NAMENDA) 10 MG tablet Take 1 tablet (10 mg total) by mouth 2 (two) times daily.  . metoprolol succinate (TOPROL-XL) 50 MG 24 hr tablet Take 1 tablet (50 mg total) by mouth daily. Take with or immediately following a meal.  . Multiple Vitamin (MULTIVITAMIN WITH MINERALS) TABS tablet Take 0.5 tablets by mouth at bedtime.  . nitroGLYCERIN (NITROSTAT) 0.4 MG SL tablet Place 1 tablet (0.4 mg total) under the tongue every 5 (five) minutes x 3 doses as needed for chest pain.  Marland Kitchen omeprazole (PRILOSEC) 40 MG capsule Take 40 mg by mouth daily.  . sodium bicarbonate 650 MG tablet Take 1 tablet (650 mg total) by mouth 2 (two) times daily.  . [DISCONTINUED] BIDIL 20-37.5 MG tablet TAKE 1 TABLET BY MOUTH TWICE DAILY.     Allergies:   Sulfa antibiotics   Social History   Socioeconomic History  . Marital status: Single    Spouse name: Not on file  . Number of children: 1  . Years of education: 82  . Highest education level: Not on file  Occupational History  . Occupation: Retired  Tobacco Use  . Smoking status: Former Smoker    Packs/day: 0.75    Years: 47.00    Pack years: 35.25    Types: Cigarettes    Quit date: 04/29/2015    Years since quitting: 4.7  . Smokeless tobacco: Never Used  Substance and Sexual Activity  . Alcohol use: Yes    Comment: 05/21/2018 "maybe 6 drinks/year"  . Drug use: Never  . Sexual activity: Not on file  Other Topics Concern  . Not on file  Social History Narrative   Lives alone   Caffeine  use: 1 cup coffee per day   Right handed    Social Determinants of Health   Financial Resource Strain:   . Difficulty of Paying Living Expenses: Not on file  Food Insecurity:   . Worried About Charity fundraiser in the Last Year: Not on file  . Ran Out of Food in the Last Year: Not on file  Transportation Needs: No Transportation Needs  . Lack of Transportation (Medical): No  . Lack of Transportation (Non-Medical): No  Physical Activity:   . Days of Exercise per Week: Not on file  . Minutes of Exercise per Session: Not on file  Stress:   . Feeling of Stress : Not on file  Social Connections:   . Frequency of Communication with  Friends and Family: Not on file  . Frequency of Social Gatherings with Friends and Family: Not on file  . Attends Religious Services: Not on file  . Active Member of Clubs or Organizations: Not on file  . Attends Archivist Meetings: Not on file  . Marital Status: Not on file     Family History: The patient's family history includes Cancer in her father, sister, and sister; Dementia in her brother; Heart attack in her brother and brother; Other in her son.  ROS:   Please see the history of present illness.    Medication expense all other systems reviewed and are negative.  EKGs/Labs/Other Studies Reviewed:    The following studies were reviewed today: No new imaging or functional data  EKG:  EKG not repeated  Recent Labs: 06/09/2019: B Natriuretic Peptide 80.7 07/02/2019: Magnesium 1.8 08/11/2019: ALT 10; BUN 23; Creatinine 2.33; Hemoglobin 12.1; Platelet Count 302; Potassium 3.1; Sodium 145  Recent Lipid Panel    Component Value Date/Time   CHOL 175 03/25/2018 0216   TRIG 103 03/25/2018 0216   HDL 39 (L) 03/25/2018 0216   CHOLHDL 4.5 03/25/2018 0216   VLDL 21 03/25/2018 0216   LDLCALC 115 (H) 03/25/2018 0216    Physical Exam:    VS:  BP 132/64   Pulse 69   Ht 5' 3.5" (1.613 m)   Wt 129 lb 12.8 oz (58.9 kg)   SpO2 95%   BMI  22.63 kg/m     Wt Readings from Last 3 Encounters:  01/11/20 129 lb 12.8 oz (58.9 kg)  10/13/19 125 lb 6.4 oz (56.9 kg)  10/05/19 130 lb 6.4 oz (59.1 kg)     GEN: Appears younger than his stated age and less ill than he would think.. No acute distress HEENT: Normal NECK: No JVD. LYMPHATICS: No lymphadenopathy CARDIAC:  RRR without murmur, gallop, or edema. VASCULAR:  Normal Pulses. No bruits. RESPIRATORY:  Clear to auscultation without rales, wheezing or rhonchi  ABDOMEN: Soft, non-tender, non-distended, No pulsatile mass, MUSCULOSKELETAL: No deformity  SKIN: Warm and dry NEUROLOGIC:  Alert and oriented x 3 PSYCHIATRIC:  Normal affect   ASSESSMENT:    1. Chronic systolic heart failure ([place])   2. Left bundle branch block   3. CKD (chronic kidney disease), stage IV ([place])   4. Cognitive and neurobehavioral dysfunction   5. S/P lobectomy of lung   6. Coronary artery disease involving native coronary artery of native heart without angina pectoris   7. Peripheral arterial disease ([place])   8. Educated about COVID-19 virus infection    PLAN:    In order of problems listed above:  1. Unable to use ARB/ACE/Arni due to renal insufficiency.  Uptitrate BiDil to 3 times daily. 2. Not discussed 3. Be met on next office visit 4. No obvious change 5. No comment 6. Continue risk factor modification.  Discussed secondary prevention including exercise. 7. Encouraged walking.  Encouraged notification if pain at rest or discoloration/nonhealing ulcer 8. She has had COVID-19.  She should get the vaccine.  Her son is aware.  Overall education and awareness concerning primary/secondary risk prevention was discussed in detail: LDL less than 70, hemoglobin A1c less than 7, blood pressure target less than 130/80 mmHg, >150 minutes of moderate aerobic activity per week, avoidance of smoking, weight control (via diet and exercise), and continued surveillance/management of/for obstructive sleep  apnea.    Medication Adjustments/Labs and Tests Ordered: Current medicines are reviewed at length with the patient today.  Concerns regarding medicines are outlined above.  No orders of the defined types were placed in this encounter.  Meds ordered this encounter  Medications  . isosorbide-hydrALAZINE (BIDIL) 20-37.5 MG tablet    Sig: Take 1 tablet by mouth 3 (three) times daily.    Dispense:  270 tablet    Refill:  3    Dose change    Patient Instructions  Medication Instructions:  1) INCREASE Bidil to three times daily  *If you need a refill on your cardiac medications before your next appointment, please call your pharmacy*   Lab Work: None If you have labs (blood work) drawn today and your tests are completely normal, you will receive your results only by: Marland Kitchen MyChart Message (if you have MyChart) OR . A paper copy in the mail If you have any lab test that is abnormal or we need to change your treatment, we will call you to review the results.   Testing/Procedures: None   Follow-Up: At [facility], you and your health needs are our priority.  As part of our continuing mission to provide you with exceptional heart care, we have created designated Provider Care Teams.  These Care Teams include your primary Cardiologist (physician) and Advanced Practice Providers (APPs -  Physician Assistants and Nurse Practitioners) who all work together to provide you with the care you need, when you need it.  We recommend signing up for the patient portal called "MyChart".  Sign up information is provided on this After Visit Summary.  MyChart is used to connect with patients for Virtual Visits (Telemedicine).  Patients are able to view lab/test results, encounter notes, upcoming appointments, etc.  Non-urgent messages can be sent to your provider as well.   To learn more about what you can do with MyChart, go to [email or web address].    Your next appointment:   6 month(s)  The  format for your next appointment:   In Person  Provider:   You may see Betty Grooms, MD or one of the following Advanced Practice Providers on your designated Care Team:    Truitt Merle, NP  [name], NP  Kathyrn Drown, NP    Other Instructions      Signed, Betty Grooms, MD  01/11/2020 2:21 PM    [place]

## 2020-01-11 ENCOUNTER — Other Ambulatory Visit: Payer: Self-pay

## 2020-01-11 ENCOUNTER — Encounter: Payer: Self-pay | Admitting: Interventional Cardiology

## 2020-01-11 ENCOUNTER — Ambulatory Visit (INDEPENDENT_AMBULATORY_CARE_PROVIDER_SITE_OTHER): Payer: Medicare Other | Admitting: Interventional Cardiology

## 2020-01-11 VITALS — BP 132/64 | HR 69 | Ht 63.5 in | Wt 129.8 lb

## 2020-01-11 DIAGNOSIS — I2510 Atherosclerotic heart disease of native coronary artery without angina pectoris: Secondary | ICD-10-CM

## 2020-01-11 DIAGNOSIS — I447 Left bundle-branch block, unspecified: Secondary | ICD-10-CM | POA: Diagnosis not present

## 2020-01-11 DIAGNOSIS — F09 Unspecified mental disorder due to known physiological condition: Secondary | ICD-10-CM | POA: Diagnosis not present

## 2020-01-11 DIAGNOSIS — I739 Peripheral vascular disease, unspecified: Secondary | ICD-10-CM

## 2020-01-11 DIAGNOSIS — I5022 Chronic systolic (congestive) heart failure: Secondary | ICD-10-CM | POA: Diagnosis not present

## 2020-01-11 DIAGNOSIS — F0789 Other personality and behavioral disorders due to known physiological condition: Secondary | ICD-10-CM | POA: Diagnosis not present

## 2020-01-11 DIAGNOSIS — N184 Chronic kidney disease, stage 4 (severe): Secondary | ICD-10-CM | POA: Diagnosis not present

## 2020-01-11 DIAGNOSIS — Z902 Acquired absence of lung [part of]: Secondary | ICD-10-CM | POA: Diagnosis not present

## 2020-01-11 DIAGNOSIS — Z7189 Other specified counseling: Secondary | ICD-10-CM | POA: Diagnosis not present

## 2020-01-11 MED ORDER — BIDIL 20-37.5 MG PO TABS: 1.0000 | ORAL_TABLET | Freq: Three times a day (TID) | ORAL | 3 refills | Status: DC

## 2020-01-11 NOTE — Patient Instructions (Signed)
Medication Instructions:  1) INCREASE Bidil to three times daily  *If you need a refill on your cardiac medications before your next appointment, please call your pharmacy*   Lab Work: None If you have labs (blood work) drawn today and your tests are completely normal, you will receive your results only by: Marland Kitchen MyChart Message (if you have MyChart) OR . A paper copy in the mail If you have any lab test that is abnormal or we need to change your treatment, we will call you to review the results.   Testing/Procedures: None   Follow-Up: At [facility], you and your health needs are our priority.  As part of our continuing mission to provide you with exceptional heart care, we have created designated Provider Care Teams.  These Care Teams include your primary Cardiologist (physician) and Advanced Practice Providers (APPs -  Physician Assistants and Nurse Practitioners) who all work together to provide you with the care you need, when you need it.  We recommend signing up for the patient portal called "MyChart".  Sign up information is provided on this After Visit Summary.  MyChart is used to connect with patients for Virtual Visits (Telemedicine).  Patients are able to view lab/test results, encounter notes, upcoming appointments, etc.  Non-urgent messages can be sent to your provider as well.   To learn more about what you can do with MyChart, go to [email or web address].    Your next appointment:   6 month(s)  The format for your next appointment:   In Person  Provider:   You may see Sinclair Grooms, MD or one of the following Advanced Practice Providers on your designated Care Team:    Truitt Merle, NP  [name], NP  Kathyrn Drown, NP    Other Instructions

## 2020-02-08 ENCOUNTER — Ambulatory Visit (INDEPENDENT_AMBULATORY_CARE_PROVIDER_SITE_OTHER): Payer: Medicare Other | Admitting: *Deleted

## 2020-02-08 DIAGNOSIS — I5022 Chronic systolic (congestive) heart failure: Secondary | ICD-10-CM | POA: Diagnosis not present

## 2020-02-08 LAB — CUP PACEART REMOTE DEVICE CHECK
Battery Remaining Longevity: 65 mo
Battery Voltage: 2.98 V
Brady Statistic AP VP Percent: 5.07 %
Brady Statistic AP VS Percent: 0.14 %
Brady Statistic AS VP Percent: 92.95 %
Brady Statistic AS VS Percent: 1.84 %
Brady Statistic RA Percent Paced: 5.53 %
Brady Statistic RV Percent Paced: 0.29 %
Date Time Interrogation Session: 20210405001902
Implantable Lead Implant Date: 20190717
Implantable Lead Implant Date: 20190717
Implantable Lead Implant Date: 20190717
Implantable Lead Location: 753858
Implantable Lead Location: 753859
Implantable Lead Location: 753860
Implantable Lead Model: 5076
Implantable Lead Model: 5076
Implantable Pulse Generator Implant Date: 20190717
Lead Channel Impedance Value: 1064 Ohm
Lead Channel Impedance Value: 1083 Ohm
Lead Channel Impedance Value: 1083 Ohm
Lead Channel Impedance Value: 1121 Ohm
Lead Channel Impedance Value: 1216 Ohm
Lead Channel Impedance Value: 361 Ohm
Lead Channel Impedance Value: 361 Ohm
Lead Channel Impedance Value: 437 Ohm
Lead Channel Impedance Value: 532 Ohm
Lead Channel Impedance Value: 551 Ohm
Lead Channel Impedance Value: 589 Ohm
Lead Channel Impedance Value: 627 Ohm
Lead Channel Impedance Value: 722 Ohm
Lead Channel Impedance Value: 931 Ohm
Lead Channel Pacing Threshold Amplitude: 0.625 V
Lead Channel Pacing Threshold Amplitude: 0.875 V
Lead Channel Pacing Threshold Amplitude: 2.5 V
Lead Channel Pacing Threshold Pulse Width: 0.4 ms
Lead Channel Pacing Threshold Pulse Width: 0.4 ms
Lead Channel Pacing Threshold Pulse Width: 0.6 ms
Lead Channel Sensing Intrinsic Amplitude: 14.875 mV
Lead Channel Sensing Intrinsic Amplitude: 14.875 mV
Lead Channel Sensing Intrinsic Amplitude: 2.75 mV
Lead Channel Sensing Intrinsic Amplitude: 2.75 mV
Lead Channel Setting Pacing Amplitude: 1.5 V
Lead Channel Setting Pacing Amplitude: 2 V
Lead Channel Setting Pacing Amplitude: 4 V
Lead Channel Setting Pacing Pulse Width: 0.4 ms
Lead Channel Setting Pacing Pulse Width: 0.6 ms
Lead Channel Setting Sensing Sensitivity: 1.2 mV

## 2020-02-09 NOTE — Progress Notes (Signed)
PPM Remote  

## 2020-02-15 DIAGNOSIS — N184 Chronic kidney disease, stage 4 (severe): Secondary | ICD-10-CM | POA: Diagnosis not present

## 2020-03-02 DIAGNOSIS — N2581 Secondary hyperparathyroidism of renal origin: Secondary | ICD-10-CM | POA: Diagnosis not present

## 2020-03-02 DIAGNOSIS — D631 Anemia in chronic kidney disease: Secondary | ICD-10-CM | POA: Diagnosis not present

## 2020-03-02 DIAGNOSIS — F0390 Unspecified dementia without behavioral disturbance: Secondary | ICD-10-CM | POA: Diagnosis not present

## 2020-03-02 DIAGNOSIS — N184 Chronic kidney disease, stage 4 (severe): Secondary | ICD-10-CM | POA: Diagnosis not present

## 2020-03-02 DIAGNOSIS — E872 Acidosis: Secondary | ICD-10-CM | POA: Diagnosis not present

## 2020-03-02 DIAGNOSIS — I129 Hypertensive chronic kidney disease with stage 1 through stage 4 chronic kidney disease, or unspecified chronic kidney disease: Secondary | ICD-10-CM | POA: Diagnosis not present

## 2020-03-08 DIAGNOSIS — Z0000 Encounter for general adult medical examination without abnormal findings: Secondary | ICD-10-CM | POA: Diagnosis not present

## 2020-03-08 DIAGNOSIS — Z1389 Encounter for screening for other disorder: Secondary | ICD-10-CM | POA: Diagnosis not present

## 2020-03-15 DIAGNOSIS — I10 Essential (primary) hypertension: Secondary | ICD-10-CM | POA: Diagnosis not present

## 2020-03-15 DIAGNOSIS — Z1379 Encounter for other screening for genetic and chromosomal anomalies: Secondary | ICD-10-CM | POA: Diagnosis not present

## 2020-03-15 DIAGNOSIS — Z1231 Encounter for screening mammogram for malignant neoplasm of breast: Secondary | ICD-10-CM | POA: Diagnosis not present

## 2020-03-29 ENCOUNTER — Encounter: Payer: Self-pay | Admitting: Neurology

## 2020-03-29 ENCOUNTER — Other Ambulatory Visit: Payer: Self-pay

## 2020-03-29 ENCOUNTER — Ambulatory Visit (INDEPENDENT_AMBULATORY_CARE_PROVIDER_SITE_OTHER): Payer: Medicare Other | Admitting: Neurology

## 2020-03-29 VITALS — BP 138/67 | HR 72 | Ht 64.0 in | Wt 130.0 lb

## 2020-03-29 DIAGNOSIS — F0150 Vascular dementia without behavioral disturbance: Secondary | ICD-10-CM

## 2020-03-29 DIAGNOSIS — I671 Cerebral aneurysm, nonruptured: Secondary | ICD-10-CM

## 2020-03-29 MED ORDER — DONEPEZIL HCL 10 MG PO TABS: 10.0000 mg | ORAL_TABLET | Freq: Every day | ORAL | 1 refills | Status: DC

## 2020-03-29 MED ORDER — MEMANTINE HCL 10 MG PO TABS: 10.0000 mg | ORAL_TABLET | Freq: Two times a day (BID) | ORAL | 3 refills | Status: DC

## 2020-03-29 NOTE — Progress Notes (Signed)
PATIENT: Betty [name] DOB: 1946/06/19  REASON FOR VISIT: follow up HISTORY FROM: patient  HISTORY OF PRESENT ILLNESS: Today 03/29/20  Betty [name] is a 74 year old female with history of renal insufficiency and progressive memory problem.  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  She has a pacemaker.  For her memory, she is on Aricept and Namenda.  Feels memory is overall stable.  She lives alone, her son check daily, he does the cooking.  He manages her medications.  She does her own ADLs, and some light housework.  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  She walks daily, half a mile or more with her son.  She sleeps well.  No recent falls.  Indicates, they have been unable to reach Dr. Estanislado Pandy, review of chart indicates MRA was scheduled back in December, but appointment was no-show.  Her overall health is good.  Denies any new problems or concerns.  Presents today for follow-up accompanied by her son.  HISTORY 09/29/2019 SS: Betty [name] is a 74 year old female with history of renal insufficiency and progressive memory problem.  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  She currently has a pacemaker.  She was admitted to the hospital in July with Covid requiring mechanical intubation, she was discharged to [facility] rehabilitation.  She was re-admitted for a GI bleed.  She is here today with her son.  She reports she has recovered quite well from being so ill. She has a lapse in her memory, where she doesn't remember Christmas 2019, up until a few months ago.  She lives alone.  She performs all of her own ADLs.  Her son lives nearby, and checks in on her.  He prepares her medications, and cooks for her.  She uses a cane, just in case when she goes out, because she has gout.  She reports she sleeps well.  She denies any hallucinations.   She has not had repeat scan to evaluate the aneurysm with Dr. Estanislado Pandy, as this was delayed due to her being ill with COVID.  She presents for follow-up company by her son.  REVIEW OF SYSTEMS: Out of a complete 14 system review of symptoms, the patient complains only of the following symptoms, and all other reviewed systems are negative.  Memory loss  ALLERGIES: Allergies  Allergen Reactions  . Sulfa Antibiotics Itching    HOME MEDICATIONS: Outpatient Medications Prior to Visit  Medication Sig Dispense Refill  . acetaminophen (TYLENOL) 325 MG tablet Take 650 mg by mouth every 6 (six) hours as needed for mild pain or moderate pain.    Marland Kitchen aspirin EC 81 MG tablet Take 81 mg by mouth at bedtime.    Marland Kitchen atorvastatin (LIPITOR) 40 MG tablet TAKE 1 TABLET BY MOUTH ONCE DAILY AT 6 PM 90 tablet 2  . Cholecalciferol (VITAMIN D) 2000 units tablet Take 1,000 Units by mouth at bedtime.     . donepezil (ARICEPT) 10 MG tablet Take 1 tablet (10 mg total) by mouth at bedtime. 90 tablet 1  . febuxostat (ULORIC) 40 MG tablet Take 40 mg by mouth at bedtime.     . furosemide (LASIX) 20 MG tablet Take 20 mg by mouth daily.    . isosorbide-hydrALAZINE (BIDIL) 20-37.5 MG tablet Take 1 tablet by mouth 3 (three) times daily. [place]  tablet 3  . memantine (NAMENDA) 10 MG tablet Take 1 tablet (10 mg total) by mouth 2 (two) times daily. 180 tablet 3  . Multiple Vitamin (MULTIVITAMIN WITH MINERALS) TABS tablet Take 0.5 tablets by mouth at bedtime.    . nitroGLYCERIN (NITROSTAT) 0.4 MG SL tablet Place 1 tablet (0.4 mg total) under the tongue every 5 (five) minutes x 3 doses as needed for chest pain. 25 tablet 12  . omeprazole (PRILOSEC) 40 MG capsule Take 40 mg by mouth daily.    . sodium bicarbonate 650 MG tablet Take 1 tablet (650 mg total) by mouth 2 (two) times daily. 30 tablet 0  . metoprolol succinate (TOPROL-XL) 50 MG 24 hr tablet Take 1 tablet (50 mg total) by mouth daily. Take with or immediately following a meal. 90  tablet 3   No facility-administered medications prior to visit.    PAST MEDICAL HISTORY: Past Medical History:  Diagnosis Date  . 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 Acute mastitis of right breast 10/2003  . Arthritis    bursitis in right shoulder (05/21/2018)  . 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 CKD (chronic kidney disease), stage IV ([place])   . Colon polyps   . 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 Dementia ([place]) 07/16/2018  . Family history of breast cancer   . GERD (gastroesophageal reflux disease)   . Hypercholesterolemia   . Hypertension   . Non-small cell carcinoma of right lung, stage 1 ([place]) 05/17/2015   Stage IB, right upper lobectomy 04/29/15, chemo   . NSTEMI (non-ST elevated myocardial infarction) ([place]) 03/24/2018  . Presence of permanent cardiac pacemaker 05/21/2018  . Pure hypercholesterolemia   . Sciatica of right side   . Seasonal allergic rhinitis   . Tobacco dependence   . Tubular adenoma of colon   . Vitamin D deficiency     PAST SURGICAL HISTORY: Past Surgical History:  Procedure Laterality Date  . BIOPSY  06/26/2019   Procedure: BIOPSY;  Surgeon: Wilford Corner, MD;  [facility] ENDOSCOPY;  Service: Endoscopy;;  . BIV PACEMAKER INSERTION CRT-P N/A 05/21/2018   Procedure: BIV PACEMAKER INSERTION CRT-P;  Surgeon: Deboraha Sprang, MD;  Location: [place] CV LAB;  Service: Cardiovascular;  Laterality: N/A;  . BREAST SURGERY     small mass removed from left breast--benign  . CRYO INTERCOSTAL NERVE BLOCK Right 04/29/2015   Procedure: CRYO INTERCOSTAL NERVE BLOCK;  Surgeon: [name], MD;  Location: [place];  Service: Thoracic;  Laterality: Right;  . ESOPHAGOGASTRODUODENOSCOPY (EGD) WITH PROPOFOL N/A 06/26/2019   Procedure: ESOPHAGOGASTRODUODENOSCOPY (EGD) WITH PROPOFOL;  Surgeon: Wilford Corner, MD;  Location: [place];  Service:  Endoscopy;  Laterality: N/A;  . INSERT / REPLACE / REMOVE PACEMAKER  05/21/2018  . LOBECTOMY Right 04/29/2015   Procedure: RIGHT LOWER LUNG LOBECTOMY ;  Surgeon: [name], MD;  Location: [place];  Service: Thoracic;  Laterality: Right;  . LYMPH NODE DISSECTION Right 04/29/2015   Procedure: RIGHT LUNG LYMPH NODE DISSECTION;  Surgeon: [name], MD;  Location: [place];  Service: Thoracic;  Laterality: Right;  . RIGHT/LEFT HEART CATH AND CORONARY ANGIOGRAPHY N/A 04/24/2018   Procedure: RIGHT/LEFT HEART CATH AND CORONARY ANGIOGRAPHY;  Surgeon: [name], MD;  Location: [place] CV LAB;  Service: Cardiovascular;  Laterality: N/A;  . VAGINAL DELIVERY     51 yrs ago  . VIDEO ASSISTED THORACOSCOPY (  VATS)/ LOBECTOMY Right 04/29/2015   Procedure: RIGHT VIDEO ASSISTED THORACOSCOPY (VATS) WEDGE RESECTION/ RIGHT LOWER LOBECTOMY, CRYO-ANALGESIA OF INTERCOSTAL NERVES;  Surgeon: [name], MD;  Location: [place];  Service: Thoracic;  Laterality: Right;    FAMILY HISTORY: Family History  Problem Relation Age of Onset  . Cancer Father   . Cancer Sister   . Heart attack Brother   . Dementia Brother   . Other Son        bicuspid aortic valve  . Heart attack Brother   . Cancer Sister     SOCIAL HISTORY: Social History   Socioeconomic History  . Marital status: Single    Spouse name: Not on file  . Number of children: 1  . Years of education: 23  . Highest education level: Not on file  Occupational History  . Occupation: Retired  Tobacco Use  . Smoking status: Former Smoker    Packs/day: 0.75    Years: 47.00    Pack years: 35.25    Types: Cigarettes    Quit date: 04/29/2015    Years since quitting: 4.9  . Smokeless tobacco: Never Used  Substance and Sexual Activity  . Alcohol use: Yes    Comment: 05/21/2018 "maybe 6 drinks/year"  . Drug use: Never  . Sexual activity: Not on file  Other Topics Concern  . Not on file  Social History Narrative   Lives  alone   Caffeine use: 1 cup coffee per day   Right handed    Social Determinants of Health   Financial Resource Strain:   . Difficulty of Paying Living Expenses:   Food Insecurity:   . Worried About Charity fundraiser in the Last Year:   . Arboriculturist in the Last Year:   Transportation Needs: No Transportation Needs  . Lack of Transportation (Medical): No  . Lack of Transportation (Non-Medical): No  Physical Activity:   . Days of Exercise per Week:   . Minutes of Exercise per Session:   Stress:   . Feeling of Stress :   Social Connections:   . Frequency of Communication with Friends and Family:   . Frequency of Social Gatherings with Friends and Family:   . Attends Religious Services:   . Active Member of Clubs or Organizations:   . Attends Archivist Meetings:   Marland Kitchen Marital Status:   Intimate Partner Violence:   . Fear of Current or Ex-Partner:   . Emotionally Abused:   Marland Kitchen Physically Abused:   . Sexually Abused:       PHYSICAL EXAM  Vitals:   03/29/20 1235  BP: 138/67  Pulse: 72  Weight: 130 lb (59 kg)  Height: 5\' 4"  (3.335 m)   Body mass index is 22.31 kg/m.  Generalized: Well developed, in no acute distress  MMSE - Mini Mental State Exam 03/29/2020 09/29/2019 07/16/2018  Orientation to time 1 2 3   Orientation to Place 5 4 4   Registration 1 3 3   Attention/ Calculation 2 1 5   Recall 0 0 0  Language- name 2 objects 2 2 2   Language- repeat 1 1 1   Language- follow 3 step command 3 3 3   Language- read & follow direction 1 1 1   Write a sentence 1 1 1   Copy design 1 1 1   Copy design-comments 7 animals named 10 animals -  Total score 18 19 24     Neurological examination  Mentation: Alert oriented to time, place, history taking. Follows all  commands speech and language fluent Cranial nerve II-XII: Pupils were equal round reactive to light. Extraocular movements were full, visual field were full on confrontational test. Facial sensation and strength  were normal. Head turning and shoulder shrug  were normal and symmetric. Motor: The motor testing reveals 5 over 5 strength of all 4 extremities. Good symmetric motor tone is noted throughout.  Sensory: Sensory testing is intact to soft touch on all 4 extremities. No evidence of extinction is noted.  Coordination: Cerebellar testing reveals good finger-nose-finger and heel-to-shin bilaterally.  Gait and station: Gait is normal, steady, no assistive device Reflexes: Deep tendon reflexes are symmetric and normal bilaterally.   DIAGNOSTIC DATA (LABS, IMAGING, TESTING) - I reviewed patient records, labs, notes, testing and imaging myself where available.  Lab Results  Component Value Date   WBC 9.4 08/11/2019   HGB 12.1 08/11/2019   HCT 37.2 08/11/2019   MCV 91.9 08/11/2019   PLT 302 08/11/2019      Component Value Date/Time   NA 145 08/11/2019 1108   NA 143 06/02/2018 1505   NA 145 09/18/2016 1057   K 3.1 (L) 08/11/2019 1108   K 4.9 09/18/2016 1057   CL 110 08/11/2019 1108   CO2 22 08/11/2019 1108   CO2 22 09/18/2016 1057   GLUCOSE 108 (H) 08/11/2019 1108   GLUCOSE 93 09/18/2016 1057   BUN 23 08/11/2019 1108   BUN 28 (H) 06/02/2018 1505   BUN 39.6 (H) 09/18/2016 1057   CREATININE 2.33 (H) 08/11/2019 1108   CREATININE 2.8 (H) 09/18/2016 1057   CALCIUM 9.0 08/11/2019 1108   CALCIUM 9.8 09/18/2016 1057   PROT 6.8 08/11/2019 1108   PROT 7.6 09/18/2016 1057   ALBUMIN 3.6 08/11/2019 1108   ALBUMIN 3.6 09/18/2016 1057   AST 17 08/11/2019 1108   AST 19 09/18/2016 1057   ALT 10 08/11/2019 1108   ALT 12 09/18/2016 1057   ALKPHOS 74 08/11/2019 1108   ALKPHOS 123 09/18/2016 1057   BILITOT 0.6 08/11/2019 1108   BILITOT 0.35 09/18/2016 1057   GFRNONAA 20 (L) 08/11/2019 1108   GFRAA 23 (L) 08/11/2019 1108   Lab Results  Component Value Date   CHOL 175 03/25/2018   HDL 39 (L) 03/25/2018   LDLCALC 115 (H) 03/25/2018   TRIG 103 03/25/2018   CHOLHDL 4.5 03/25/2018   Lab Results    Component Value Date   HGBA1C 6.9 (H) 05/16/2019   No results found for: VITAMINB12 No results found for: TSH    ASSESSMENT AND PLAN 74 y.o. 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 ([place]), Acute mastitis of right breast (10/2003), Arthritis, Cerebral aneurysm, nonruptured (07/16/2018), CKD (chronic kidney disease), stage IV ([place]), Colon polyps, COPD (chronic obstructive pulmonary disease) ([place]), Dementia ([place]) (07/16/2018), Family history of breast cancer, GERD (gastroesophageal reflux disease), Hypercholesterolemia, Hypertension, Non-small cell carcinoma of right lung, stage 1 ([place]) (05/17/2015), NSTEMI (non-ST elevated myocardial infarction) ([place]) (03/24/2018), Presence of permanent cardiac pacemaker (05/21/2018), Pure hypercholesterolemia, Sciatica of right side, Seasonal allergic rhinitis, Tobacco dependence, Tubular adenoma of colon, and Vitamin D deficiency. here with:  1.  Memory loss -Memory is overall stable, MMSE 18/30 -Continue Namenda 10 mg twice a day -Continue Aricept 10 mg daily -We discussed the importance of remaining active, consider incorporating brain stimulating exercises -We also discussed, planning for the future, as she currently lives alone -We'll see her back in 6 months, if she continues to remain stable,  will transition back to primary care  2.  Right anterior communicating artery aneurysm -I will reach out to Dr. Estanislado Pandy regarding scheduling repeat MRA (message sent to his PA) -Review of chart indicates was scheduled back in December, appointment was no-show -Her son indicates he had difficulty contact their office  I spent 30 minutes of face-to-face and non-face-to-face time with patient.  This included previsit chart review, lab review, study review, order entry, electronic health record documentation, patient education.  Butler Denmark, AGNP-C, DNP 03/29/2020, 12:50 PM [facility] [address] [place], [place] [address] [phone]

## 2020-03-29 NOTE — Progress Notes (Signed)
I have read the note, and I agree with the clinical assessment and plan.  Saydie Gerdts K Sylis Ketchum   

## 2020-03-29 NOTE — Patient Instructions (Signed)
Continue current medications  I will reach out to Dr. Estanislado Pandy  See you back in 6 months

## 2020-04-06 ENCOUNTER — Telehealth (HOSPITAL_COMMUNITY): Payer: Self-pay

## 2020-04-06 ENCOUNTER — Telehealth: Payer: Self-pay | Admitting: Internal Medicine

## 2020-04-06 ENCOUNTER — Other Ambulatory Visit (HOSPITAL_COMMUNITY): Payer: Self-pay | Admitting: Interventional Radiology

## 2020-04-06 DIAGNOSIS — I671 Cerebral aneurysm, nonruptured: Secondary | ICD-10-CM

## 2020-04-06 NOTE — Telephone Encounter (Signed)
Accessed patient's chart due to Caryl Pina from [facility] calling wanting to know if the patient's device is compatible for her to get an MRI. I reached out to the device clinic and then made [facility] aware it is not compatible.

## 2020-04-27 ENCOUNTER — Other Ambulatory Visit: Payer: Self-pay

## 2020-04-27 ENCOUNTER — Encounter (HOSPITAL_COMMUNITY): Payer: Self-pay

## 2020-04-27 ENCOUNTER — Ambulatory Visit (HOSPITAL_COMMUNITY): Payer: Medicare Other

## 2020-04-27 ENCOUNTER — Ambulatory Visit (HOSPITAL_COMMUNITY)
Admission: RE | Admit: 2020-04-27 | Discharge: 2020-04-27 | Disposition: A | Discharge status: Home / Self Care | Payer: Medicare Other | Source: Ambulatory Visit | Attending: Interventional Radiology | Admitting: Interventional Radiology

## 2020-04-27 DIAGNOSIS — I671 Cerebral aneurysm, nonruptured: Secondary | ICD-10-CM | POA: Insufficient documentation

## 2020-04-27 LAB — POCT I-STAT CREATININE: Creatinine, Ser: 3 mg/dL — ABNORMAL HIGH (ref 0.44–1.00)

## 2020-05-02 ENCOUNTER — Other Ambulatory Visit (HOSPITAL_COMMUNITY): Payer: Self-pay | Admitting: Interventional Radiology

## 2020-05-03 ENCOUNTER — Other Ambulatory Visit (HOSPITAL_COMMUNITY): Payer: Self-pay | Admitting: Interventional Radiology

## 2020-05-03 DIAGNOSIS — I729 Aneurysm of unspecified site: Secondary | ICD-10-CM

## 2020-05-10 ENCOUNTER — Ambulatory Visit (INDEPENDENT_AMBULATORY_CARE_PROVIDER_SITE_OTHER): Payer: Medicare Other | Admitting: *Deleted

## 2020-05-10 DIAGNOSIS — I428 Other cardiomyopathies: Secondary | ICD-10-CM | POA: Diagnosis not present

## 2020-05-10 DIAGNOSIS — I5022 Chronic systolic (congestive) heart failure: Secondary | ICD-10-CM

## 2020-05-10 LAB — CUP PACEART REMOTE DEVICE CHECK
Battery Remaining Longevity: 68 mo
Battery Voltage: 2.98 V
Brady Statistic AP VP Percent: 11.45 %
Brady Statistic AP VS Percent: 0.26 %
Brady Statistic AS VP Percent: 84.11 %
Brady Statistic AS VS Percent: 4.17 %
Brady Statistic RA Percent Paced: 14.12 %
Brady Statistic RV Percent Paced: 0.15 %
Date Time Interrogation Session: 20210705235739
Implantable Lead Implant Date: 20190717
Implantable Lead Implant Date: 20190717
Implantable Lead Implant Date: 20190717
Implantable Lead Location: 753858
Implantable Lead Location: 753859
Implantable Lead Location: 753860
Implantable Lead Model: 5076
Implantable Lead Model: 5076
Implantable Pulse Generator Implant Date: 20190717
Lead Channel Impedance Value: 1083 Ohm
Lead Channel Impedance Value: 1121 Ohm
Lead Channel Impedance Value: 1121 Ohm
Lead Channel Impedance Value: 1140 Ohm
Lead Channel Impedance Value: 1292 Ohm
Lead Channel Impedance Value: 361 Ohm
Lead Channel Impedance Value: 380 Ohm
Lead Channel Impedance Value: 437 Ohm
Lead Channel Impedance Value: 513 Ohm
Lead Channel Impedance Value: 532 Ohm
Lead Channel Impedance Value: 608 Ohm
Lead Channel Impedance Value: 665 Ohm
Lead Channel Impedance Value: 760 Ohm
Lead Channel Impedance Value: 912 Ohm
Lead Channel Pacing Threshold Amplitude: 0.625 V
Lead Channel Pacing Threshold Amplitude: 0.875 V
Lead Channel Pacing Threshold Amplitude: 2.75 V
Lead Channel Pacing Threshold Pulse Width: 0.4 ms
Lead Channel Pacing Threshold Pulse Width: 0.4 ms
Lead Channel Pacing Threshold Pulse Width: 0.6 ms
Lead Channel Sensing Intrinsic Amplitude: 12.125 mV
Lead Channel Sensing Intrinsic Amplitude: 12.125 mV
Lead Channel Sensing Intrinsic Amplitude: 2.75 mV
Lead Channel Sensing Intrinsic Amplitude: 2.75 mV
Lead Channel Setting Pacing Amplitude: 1.5 V
Lead Channel Setting Pacing Amplitude: 2 V
Lead Channel Setting Pacing Amplitude: 3.75 V
Lead Channel Setting Pacing Pulse Width: 0.4 ms
Lead Channel Setting Pacing Pulse Width: 0.6 ms
Lead Channel Setting Sensing Sensitivity: 1.2 mV

## 2020-05-12 NOTE — Progress Notes (Signed)
Remote pacemaker transmission.   

## 2020-05-19 ENCOUNTER — Other Ambulatory Visit: Payer: Self-pay

## 2020-05-19 ENCOUNTER — Ambulatory Visit (HOSPITAL_COMMUNITY)
Admission: RE | Admit: 2020-05-19 | Discharge: 2020-05-19 | Disposition: A | Discharge status: Home / Self Care | Payer: Medicare Other | Source: Ambulatory Visit | Attending: Interventional Radiology | Admitting: Interventional Radiology

## 2020-05-19 DIAGNOSIS — I729 Aneurysm of unspecified site: Secondary | ICD-10-CM

## 2020-05-19 NOTE — Consult Note (Signed)
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 Referring Physician(s): Tangelia Sanson  History of Present Illness: Betty [name] is a 74 y.o. right-handed female returns for consultation regarding evaluation and management of a previously known right middle  artery 8 mm aneurysm first discovered on an MRI scan of the brain in 2018.  The MRI of the brain was performed at the time to evaluate for patient's symptoms of memory loss.  Patient lost to follow-up.  Patient returns today accompanied by her son to discuss the management of the right mainstem artery aneurysm per  Symptomatically, the patient reports that no recent test severe sudden headaches, nausea vomiting, photophobia, seizures, or other neurological issues.  She continues to have symptoms of memory loss and reportedly dementia for which she is being followed by neurology.      Past Medical History:  Diagnosis Date  . 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 Acute mastitis of right breast 10/2003  . Arthritis    bursitis in right shoulder (05/21/2018)  . 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 CKD (chronic kidney disease), stage IV ([place])   . Colon polyps   . 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 Dementia ([place]) 07/16/2018  . Family history of breast cancer   . GERD (gastroesophageal reflux disease)   . Hypercholesterolemia   . Hypertension   . Non-small cell carcinoma of right lung, stage 1 ([place]) 05/17/2015   Stage IB, right upper lobectomy 04/29/15, chemo   . NSTEMI (non-ST elevated myocardial infarction) ([place]) 03/24/2018  . Presence of permanent cardiac pacemaker 05/21/2018  . Pure hypercholesterolemia   . Sciatica of right side   . Seasonal allergic rhinitis   . Tobacco dependence   . Tubular adenoma of  colon   . Vitamin D deficiency     Past Surgical History:  Procedure Laterality Date  . BIOPSY  06/26/2019   Procedure: BIOPSY;  Surgeon: Wilford Corner, MD;  Location: [facility] ENDOSCOPY;  Service: Endoscopy;;  . BIV PACEMAKER INSERTION CRT-P N/A 05/21/2018   Procedure: BIV PACEMAKER INSERTION CRT-P;  Surgeon: Deboraha Sprang, MD;  Location: [place] CV LAB;  Service: Cardiovascular;  Laterality: N/A;  . BREAST SURGERY     small mass removed from left breast--benign  . CRYO INTERCOSTAL NERVE BLOCK Right 04/29/2015   Procedure: CRYO INTERCOSTAL NERVE BLOCK;  Surgeon: [name], MD;  Location: [place];  Service: Thoracic;  Laterality: Right;  . ESOPHAGOGASTRODUODENOSCOPY (EGD) WITH PROPOFOL N/A 06/26/2019   Procedure: ESOPHAGOGASTRODUODENOSCOPY (EGD) WITH PROPOFOL;  Surgeon: Wilford Corner, MD;  Location: [place];  Service: Endoscopy;  Laterality: N/A;  . INSERT / REPLACE / REMOVE PACEMAKER  05/21/2018  . LOBECTOMY Right 04/29/2015   Procedure: RIGHT LOWER LUNG LOBECTOMY ;  Surgeon: [name], MD;  Location: [place];  Service: Thoracic;  Laterality: Right;  . LYMPH NODE DISSECTION Right 04/29/2015   Procedure: RIGHT LUNG LYMPH NODE DISSECTION;  Surgeon: [name], MD;  Location: [place];  Service: Thoracic;  Laterality: Right;  . RIGHT/LEFT HEART CATH AND CORONARY ANGIOGRAPHY N/A 04/24/2018   Procedure: RIGHT/LEFT HEART CATH AND CORONARY ANGIOGRAPHY;  Surgeon: [name], MD;  Location: [place] [facility];  Service: Cardiovascular;  Laterality: N/A;  . VAGINAL DELIVERY     51 yrs ago  . VIDEO ASSISTED THORACOSCOPY (VATS)/  LOBECTOMY Right 04/29/2015   Procedure: RIGHT VIDEO ASSISTED THORACOSCOPY (VATS) WEDGE RESECTION/ RIGHT LOWER LOBECTOMY, CRYO-ANALGESIA OF INTERCOSTAL NERVES;  Surgeon: [name], MD;  Location: [place];  Service: Thoracic;  Laterality: Right;    Allergies: Sulfa antibiotics  Medications: Prior to Admission medications     Medication Sig Start Date End Date Taking? Authorizing Provider  acetaminophen (TYLENOL) 325 MG tablet Take 650 mg by mouth every 6 (six) hours as needed for mild pain or moderate pain.    [provider]  aspirin EC 81 MG tablet Take 81 mg by mouth at bedtime.    [provider]  atorvastatin (LIPITOR) 40 MG tablet TAKE 1 TABLET BY MOUTH ONCE DAILY AT 6 PM 11/02/19   Belva Crome, MD  Cholecalciferol (VITAMIN D) 2000 units tablet Take 1,000 Units by mouth at bedtime.     [provider]  donepezil (ARICEPT) 10 MG tablet Take 1 tablet (10 mg total) by mouth at bedtime. 03/29/20   Suzzanne Cloud, NP  febuxostat (ULORIC) 40 MG tablet Take 40 mg by mouth at bedtime.     [provider]  furosemide (LASIX) 20 MG tablet Take 20 mg by mouth daily.    [provider]  isosorbide-hydrALAZINE (BIDIL) 20-37.5 MG tablet Take 1 tablet by mouth 3 (three) times daily. 01/11/20   Belva Crome, MD  memantine (NAMENDA) 10 MG tablet Take 1 tablet (10 mg total) by mouth 2 (two) times daily. 03/29/20   Suzzanne Cloud, NP  metoprolol succinate (TOPROL-XL) 50 MG 24 hr tablet Take 1 tablet (50 mg total) by mouth daily. Take with or immediately following a meal. 10/13/19 01/11/20  Belva Crome, MD  Multiple Vitamin (MULTIVITAMIN WITH MINERALS) TABS tablet Take 0.5 tablets by mouth at bedtime.    [provider]  nitroGLYCERIN (NITROSTAT) 0.4 MG SL tablet Place 1 tablet (0.4 mg total) under the tongue every 5 (five) minutes x 3 doses as needed for chest pain. 04/26/18   Daune Perch, NP  omeprazole (PRILOSEC) 40 MG capsule Take 40 mg by mouth daily.    [provider]  sodium bicarbonate 650 MG tablet Take 1 tablet (650 mg total) by mouth 2 (two) times daily. 07/02/19   Kerney Elbe, DO     Family History  Problem Relation Age of Onset  . Cancer Father   . Cancer Sister   . Heart attack Brother   . Dementia Brother   . Other Son        bicuspid  aortic valve  . Heart attack Brother   . Cancer Sister     Social History   Socioeconomic History  . Marital status: Single    Spouse name: Not on file  . Number of children: 1  . Years of education: 36  . Highest education level: Not on file  Occupational History  . Occupation: Retired  Tobacco Use  . Smoking status: Former Smoker    Packs/day: 0.75    Years: 47.00    Pack years: 35.25    Types: Cigarettes    Quit date: 04/29/2015    Years since quitting: 5.0  . Smokeless tobacco: Never Used  Vaping Use  . Vaping Use: Never used  Substance and Sexual Activity  . Alcohol use: Yes    Comment: 05/21/2018 "maybe 6 drinks/year"  . Drug use: Never  . Sexual activity: Not on file  Other Topics Concern  . Not on file  Social History Narrative  Lives alone   Caffeine use: 1 cup coffee per day   Right handed    Social Determinants of Health   Financial Resource Strain:   . Difficulty of Paying Living Expenses:   Food Insecurity:   . Worried About Charity fundraiser in the Last Year:   . Arboriculturist in the Last Year:   Transportation Needs: No Transportation Needs  . Lack of Transportation (Medical): No  . Lack of Transportation (Non-Medical): No  Physical Activity:   . Days of Exercise per Week:   . Minutes of Exercise per Session:   Stress:   . Feeling of Stress :   Social Connections:   . Frequency of Communication with Friends and Family:   . Frequency of Social Gatherings with Friends and Family:   . Attends Religious Services:   . Active Member of Clubs or Organizations:   . Attends Archivist Meetings:   Marland Kitchen Marital Status:     Review of Systems: A 12 point ROS discussed and pertinent positives are indicated in the HPI above.  All other systems are negative.   Vital Signs: There were no vitals taken for this visit. Physical Exam  Patient is alert awake oriented to place time and space.  Speech and comprehension are normal.  He has no  obvious gross cranial nerve abnormalities.  The patient is a motor and sensory and coordination are grossly normal.    Imaging: CUP PACEART REMOTE DEVICE CHECK  Result Date: 05/10/2020 Scheduled remote reviewed. Normal device function.  Next remote 91 days- JBox, RN/CVRS   Labs:  CBC: Recent Labs    06/29/19 0951 07/01/19 0842 07/02/19 0757 08/11/19 1108  WBC 8.6 11.0* 9.8 9.4  HGB 12.4 13.4 12.6 12.1  HCT 36.9 39.7 38.0 37.2  PLT 312 330 333 302    COAGS: Recent Labs    05/28/19 1300 06/24/19 1350  INR 1.1 1.5*  APTT  --  29    BMP: Recent Labs    06/29/19 0951 06/29/19 0951 07/01/19 0842 07/02/19 0757 08/11/19 1108 04/27/20 1640  NA 136  --  142 139 145  --   K 3.5  --  3.6 3.6 3.1*  --   CL 104  --  107 107 110  --   CO2 21*  --  23 21* 22  --   GLUCOSE 209*  --  99 88 108*  --   BUN 24*  --  16 16 23   --   CALCIUM 8.1*  --  8.7* 8.5* 9.0  --   CREATININE 2.47*   < > 2.21* 2.16* 2.33* 3.00*  GFRNONAA 19*  --  21* 22* 20*  --   GFRAA 22*  --  25* 25* 23*  --    < > = values in this interval not displayed.    LIVER FUNCTION TESTS: Recent Labs    06/10/19 0300 06/24/19 1348 07/01/19 0842 08/11/19 1108  BILITOT 0.6 0.7 0.8 0.6  AST 22 14* 18 17  ALT 21 9 15 10   ALKPHOS 73 44 85 74  PROT 6.6 3.1* 5.6* 6.8  ALBUMIN 2.9* 1.4* 2.4* 3.6    TUMOR MARKERS: No results for input(s): AFPTM, CEA, CA199, CHROMGRNA in the last 8760 hours.  Assessment:  The patient was scheduled to have an MRI of the brain a few weeks ago.  However this cannot be executed on account of her wearing a pacemaker.  Additionally a CT angiogram of the  head and neck was also not considered in view of the patient's worsening renal function as per the patient's son.  For an accurate evaluation of the right MCA aneurysm, an arteriogram would be ideal.  However in view of the patients renal abnormality, this would entail risk of acute renal failure with the possibility of temporary or  permanent analysis.  The patient has a an appointment scheduled with her nephrologist next week.  It was advised that they seek further recommendations in regards IV contrast and potential complications given her renal condition.  The son and the patient were advised to let us know regarding their decision as to whether they would like to proceed with diagnostic arteriogram for further evaluation and clarifying the angio architecture of the brain aneurysm and considerations for endovascular treatment.  Electronically Signed: Rob Hickman PA-C 05/19/2020, 2:32 PM   I have spent a total of 30 minutes in face to face in clinical consultation, greater than 50% of which was counseling/coordinating care for this patient.

## 2020-05-27 DIAGNOSIS — N184 Chronic kidney disease, stage 4 (severe): Secondary | ICD-10-CM | POA: Diagnosis not present

## 2020-06-02 DIAGNOSIS — C3490 Malignant neoplasm of unspecified part of unspecified bronchus or lung: Secondary | ICD-10-CM | POA: Diagnosis not present

## 2020-06-02 DIAGNOSIS — N2581 Secondary hyperparathyroidism of renal origin: Secondary | ICD-10-CM | POA: Diagnosis not present

## 2020-06-02 DIAGNOSIS — I129 Hypertensive chronic kidney disease with stage 1 through stage 4 chronic kidney disease, or unspecified chronic kidney disease: Secondary | ICD-10-CM | POA: Diagnosis not present

## 2020-06-02 DIAGNOSIS — N184 Chronic kidney disease, stage 4 (severe): Secondary | ICD-10-CM | POA: Diagnosis not present

## 2020-06-02 DIAGNOSIS — F0390 Unspecified dementia without behavioral disturbance: Secondary | ICD-10-CM | POA: Diagnosis not present

## 2020-06-02 DIAGNOSIS — D631 Anemia in chronic kidney disease: Secondary | ICD-10-CM | POA: Diagnosis not present

## 2020-06-02 DIAGNOSIS — E872 Acidosis: Secondary | ICD-10-CM | POA: Diagnosis not present

## 2020-06-03 DIAGNOSIS — Z8249 Family history of ischemic heart disease and other diseases of the circulatory system: Secondary | ICD-10-CM | POA: Diagnosis not present

## 2020-06-03 DIAGNOSIS — I10 Essential (primary) hypertension: Secondary | ICD-10-CM | POA: Diagnosis not present

## 2020-06-08 DIAGNOSIS — M545 Low back pain: Secondary | ICD-10-CM | POA: Diagnosis not present

## 2020-06-20 IMAGING — DX ABDOMEN - 1 VIEW
1 series · 1 of 1 positions shown · non-contrast
Comparison: May 15, 2019

CLINICAL DATA: NG tube placement

EXAM:
ABDOMEN - 1 VIEW

[abdomen kub]
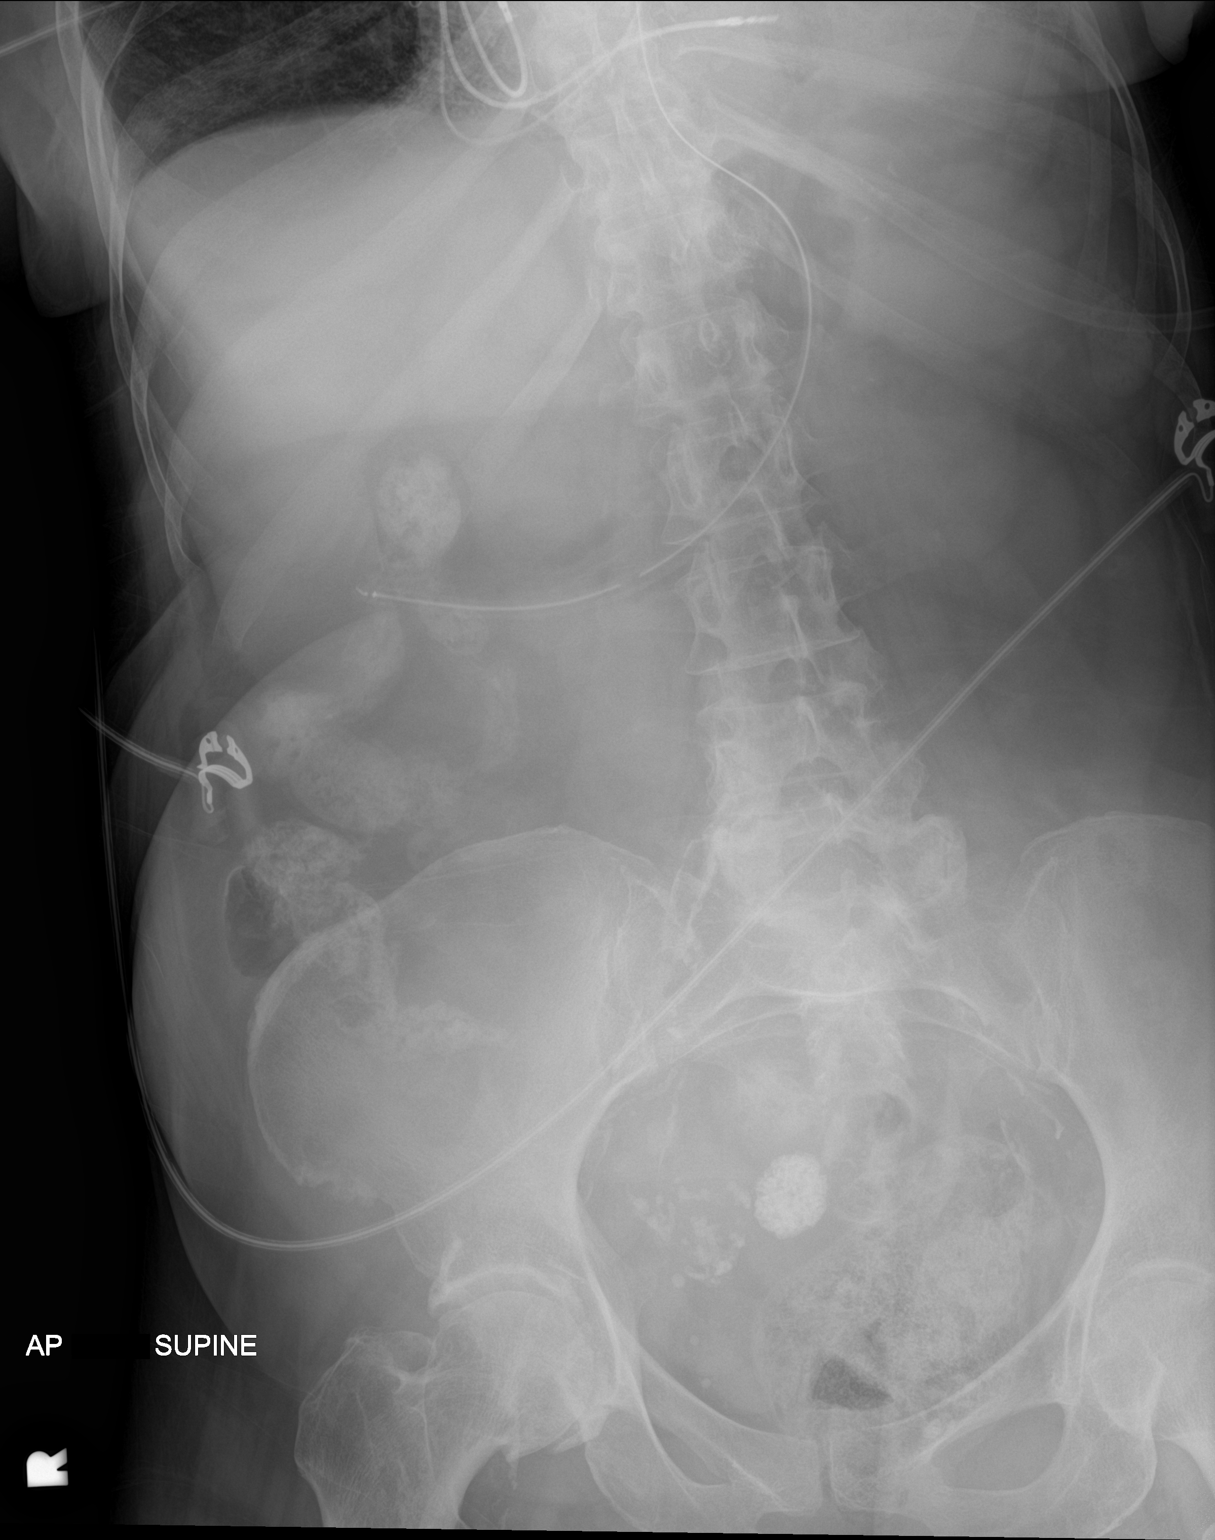

[1 of 1 positions shown; findings below may reference images not displayed]

FINDINGS: NG tube tip is in the right upper quadrant, likely in the distal
stomach or proximal duodenum. Nonobstructive bowel gas pattern.
IMPRESSION: NG tube tip in the Peri pyloric region.

## 2020-06-20 IMAGING — DX PORTABLE CHEST - 1 VIEW
1 series · 1 of 1 positions shown · non-contrast
Comparison: 05/16/2019 and earlier.

CLINICAL DATA: 73-year-old female [id number]. Intubated.

EXAM:
PORTABLE CHEST 1 VIEW

[chest ap]
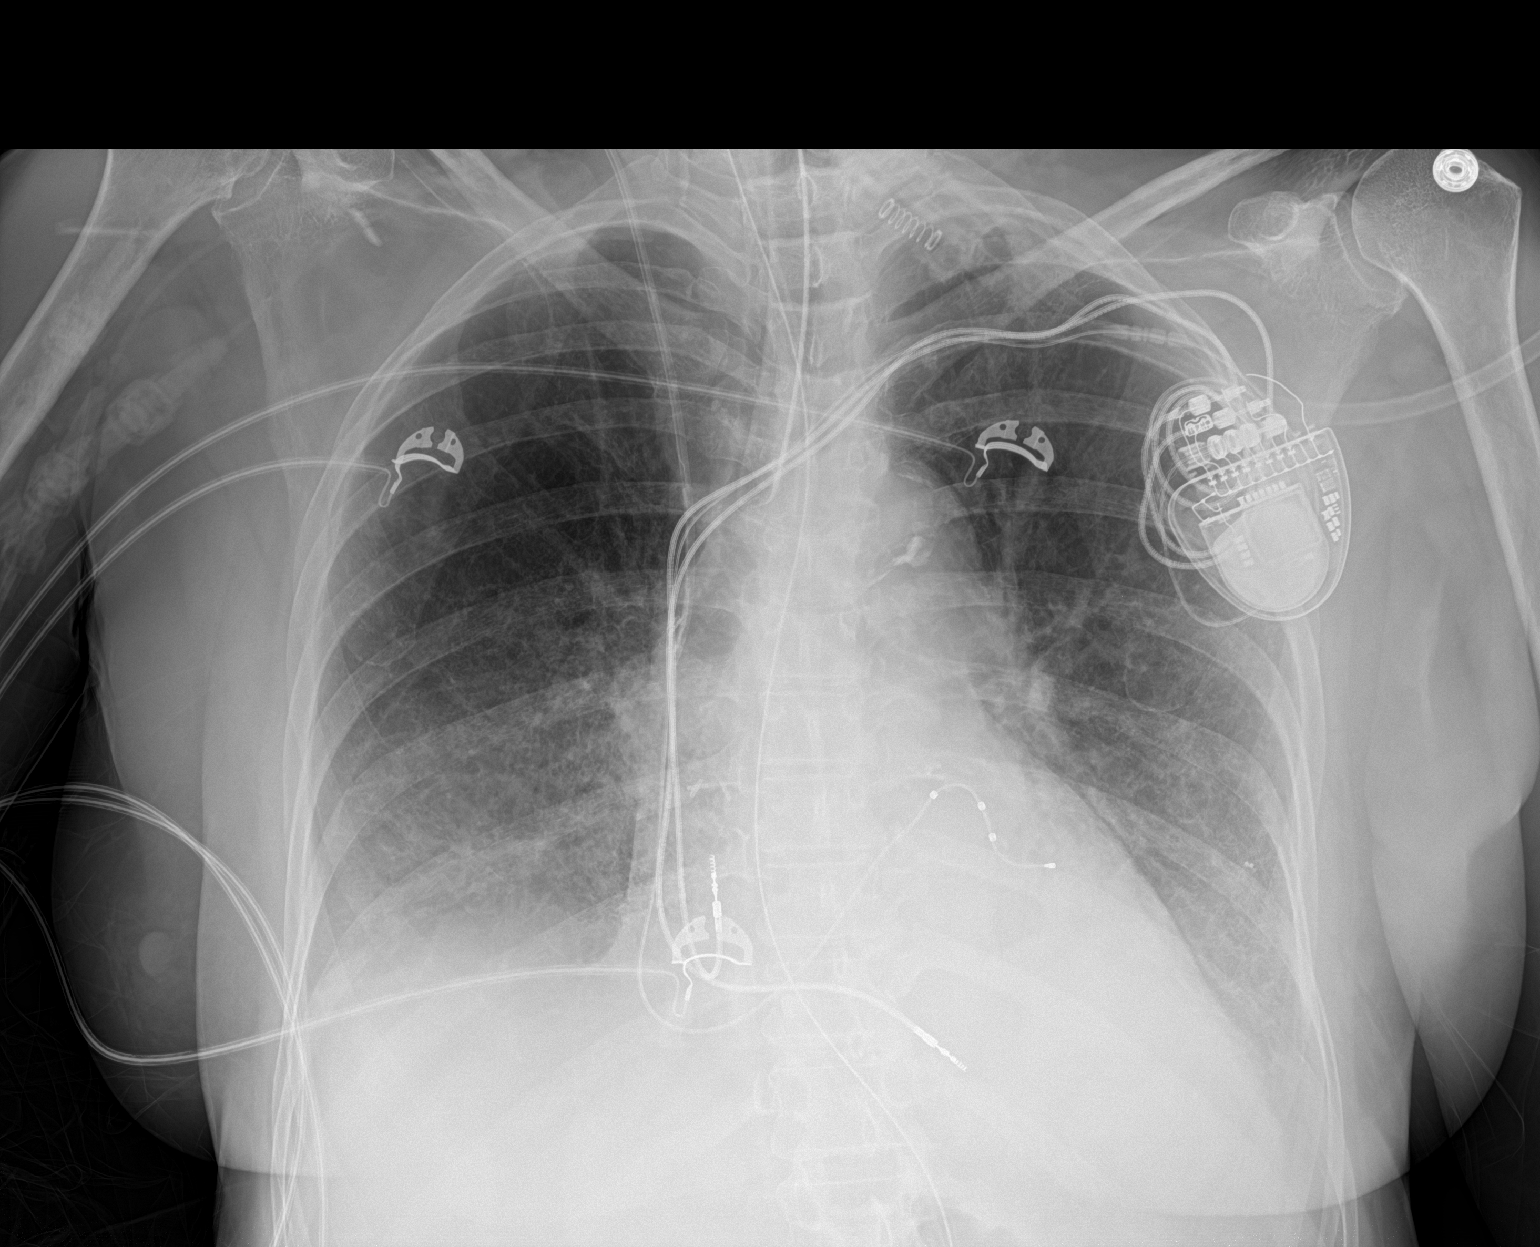

[1 of 1 positions shown; findings below may reference images not displayed]

FINDINGS: Portable AP supine view at 9099 hours. Endotracheal tube tip between
the level the clavicles and carina. Enteric tube courses to the left
upper quadrant, tip not included. Stable right IJ central line.
Stable left chest pacemaker. Stable cardiac size and mediastinal
contours. Increased veiling opacity at both lung bases since
05/16/2019. Obscuration of the diaphragm and dense retrocardiac
opacity. No pneumothorax or pulmonary edema.

Paucity of bowel gas. No acute osseous abnormality identified.
IMPRESSION: 1. ET tube in good position. Enteric tube courses to the abdomen,
tip not included.
2. Increased veiling opacity at both lung bases since 05/16/2019 [DATE]
indicate increased bilateral pleural effusions with lower lobe
collapse or consolidation.

## 2020-06-21 IMAGING — DX PORTABLE CHEST - 1 VIEW
1 series · 1 of 1 positions shown · non-contrast
Comparison: Chest x-rays dated 05/18/2019 and 05/16/2019.

CLINICAL DATA: Hx of intubation and NG placement

EXAM:
PORTABLE CHEST 1 VIEW

[chest ap]
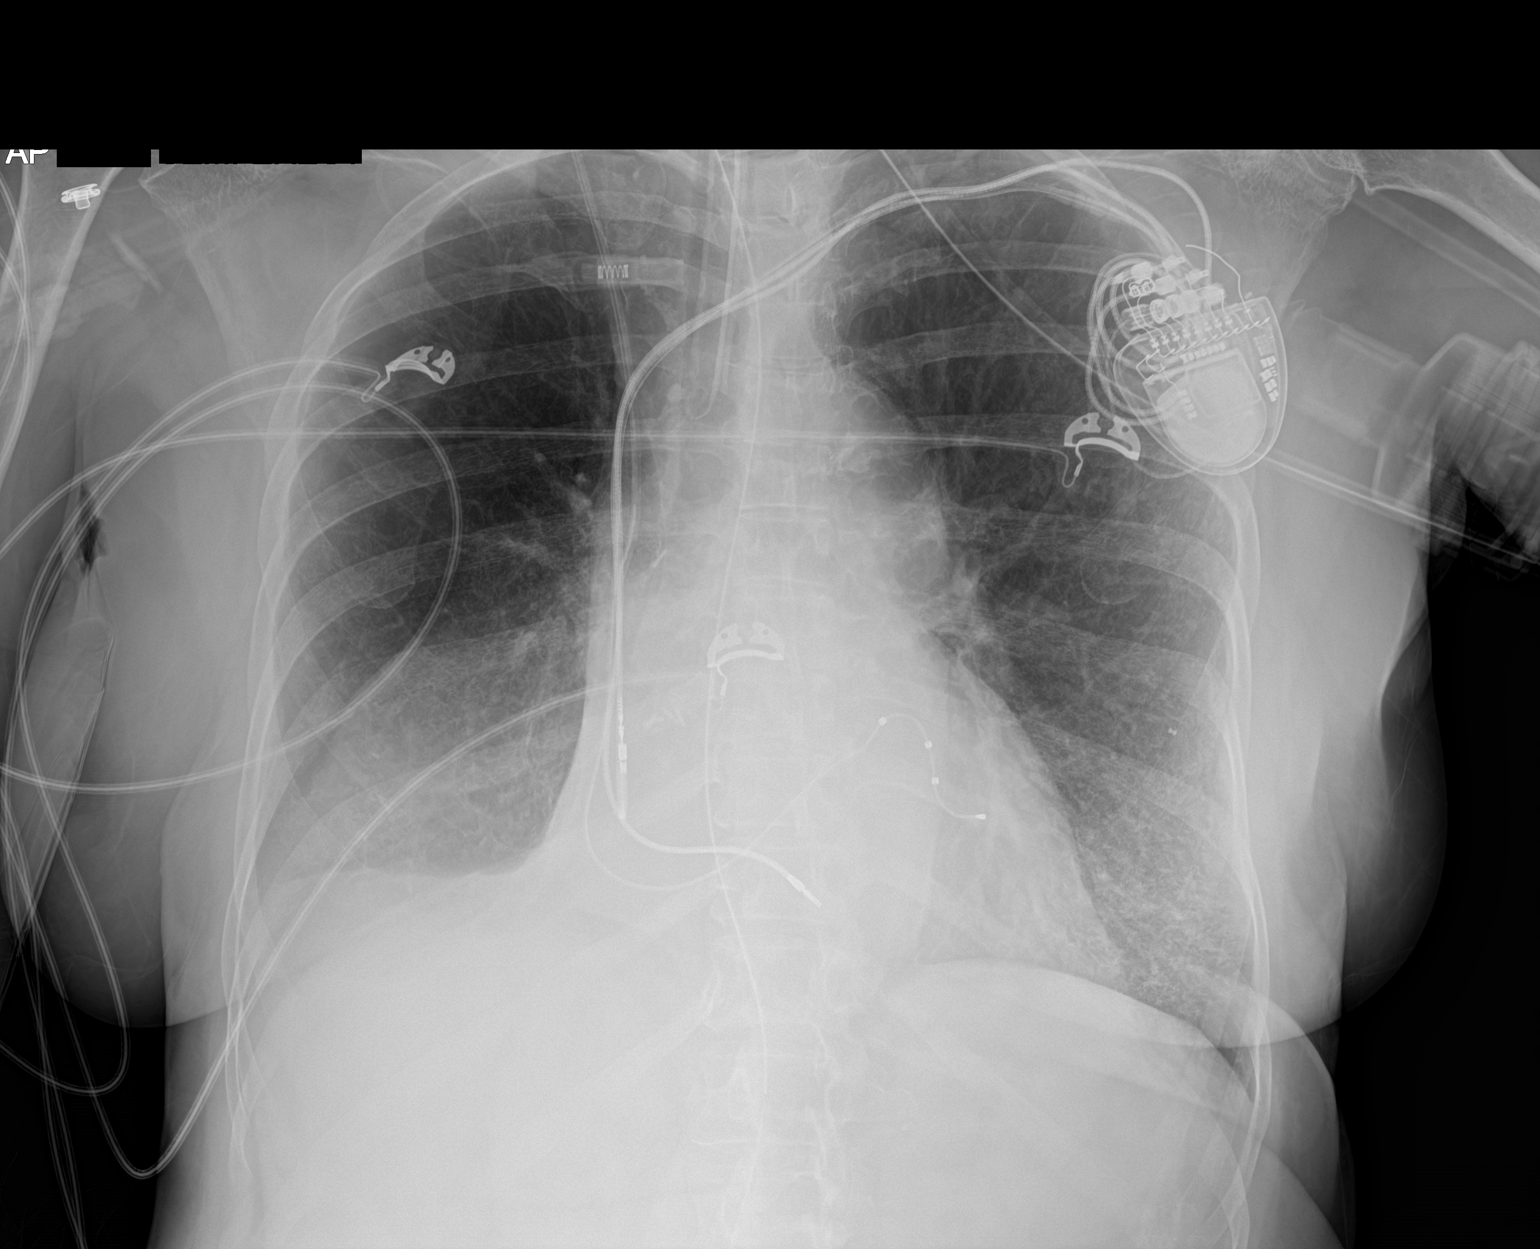

[1 of 1 positions shown; findings below may reference images not displayed]

FINDINGS: Endotracheal tube is well positioned with tip just above the level
of the carina. RIGHT IJ central line appears adequately positioned
within the SVC. Enteric tube passes below the diaphragm.

Patchy predominantly interstitial bibasilar opacities are stable,
likely a combination of interstitial edema and small pleural
effusions. No pneumothorax seen.
IMPRESSION: 1. Endotracheal tube is well positioned with tip just above the
level of the carina.
2. Enteric tube passes below the diaphragm.
3. Stable bibasilar opacities, predominantly interstitial, most
likely a combination of interstitial edema and small layering
pleural effusions, superimposed pneumonia not excluded if febrile.

## 2020-06-21 IMAGING — DX PORTABLE ABDOMEN - 1 VIEW
1 series · 1 of 1 positions shown · non-contrast
Comparison: Plain film of the abdomen dated 05/18/2019.

CLINICAL DATA: Acute respiratory failure with hypoxemia.

EXAM:
PORTABLE ABDOMEN - 1 VIEW

[abdomen kub]
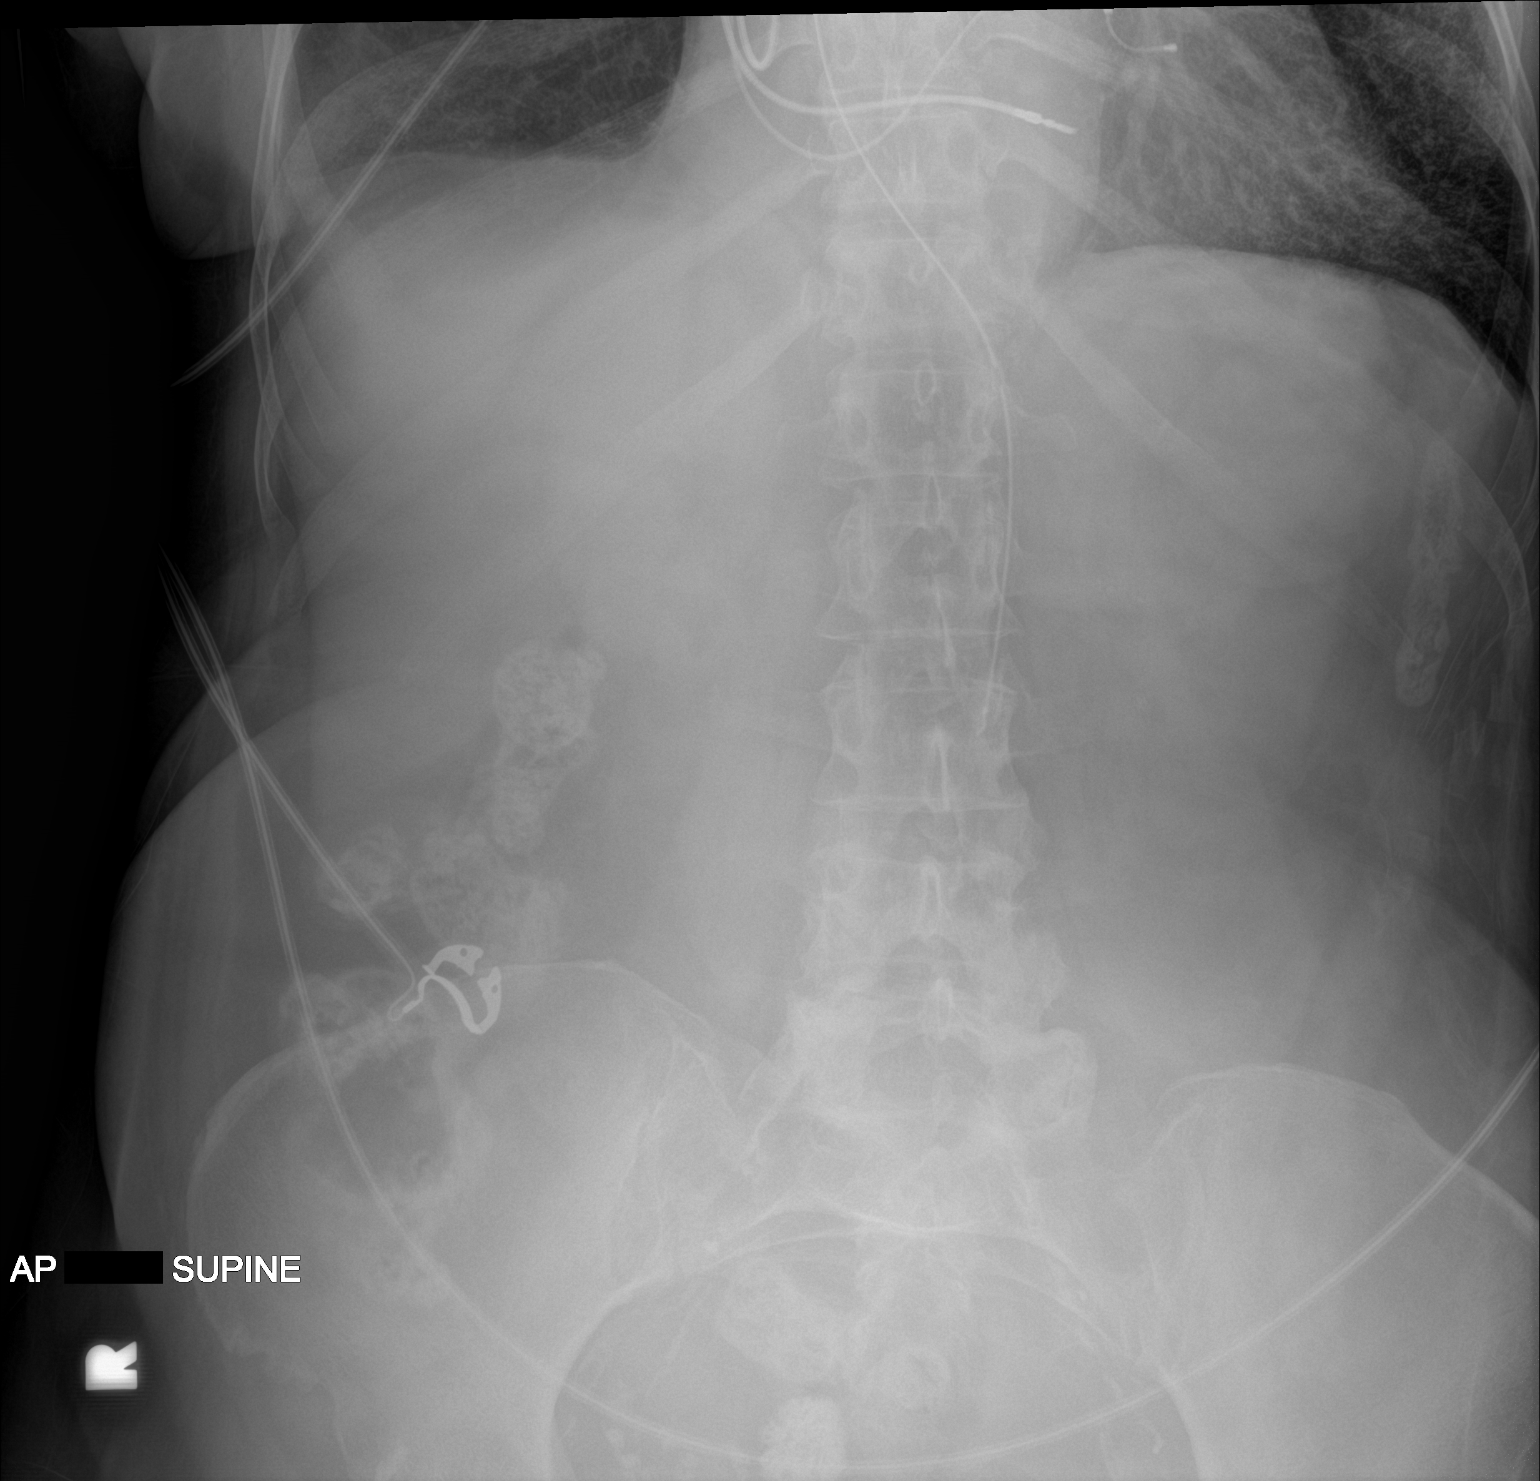

[1 of 1 positions shown; findings below may reference images not displayed]

FINDINGS: Enteric tube appears adequately positioned in the stomach.
Visualized bowel gas pattern is nonobstructive. No evidence of free
intraperitoneal air.
IMPRESSION: Enteric tube appears adequately positioned in the stomach.
Nonobstructive bowel gas pattern.

## 2020-06-24 IMAGING — DX PORTABLE CHEST - 1 VIEW
1 series · 1 of 1 positions shown · non-contrast
Comparison: Radiograph May 21, 2019.

CLINICAL DATA: Endotracheal tube present.

EXAM:
PORTABLE CHEST 1 VIEW

[chest ap]
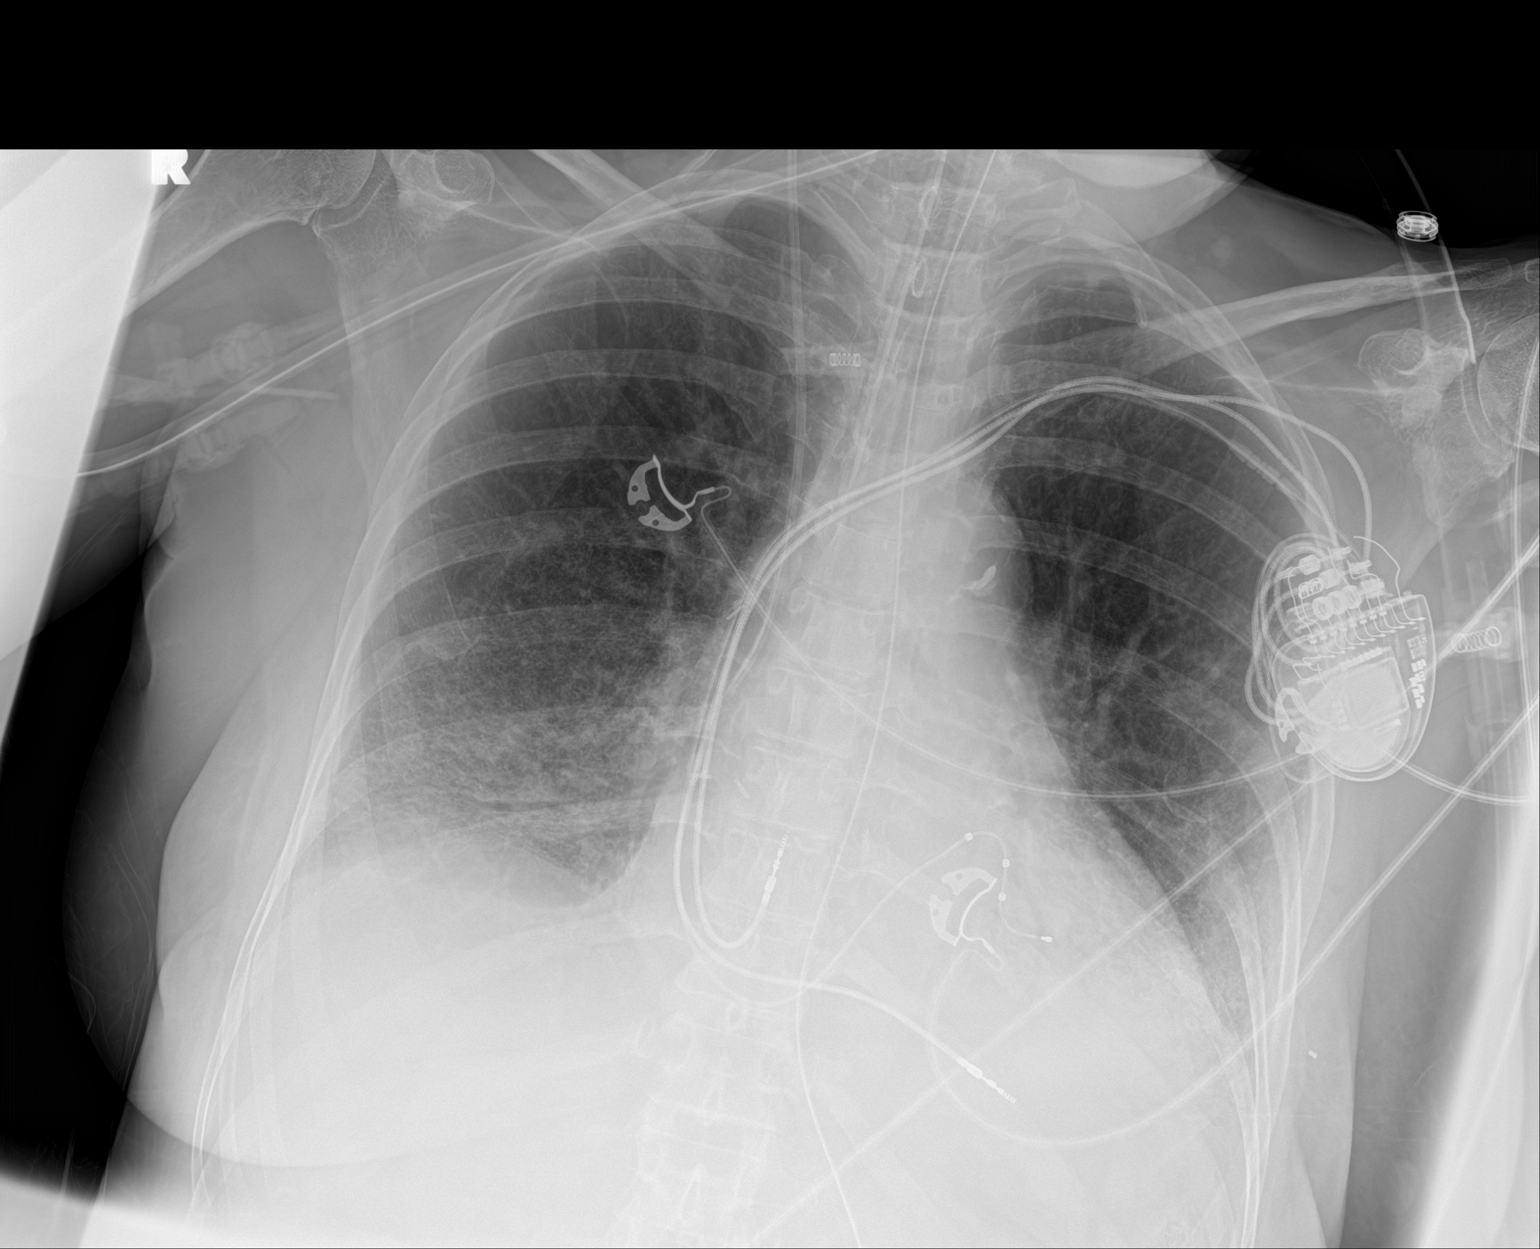

[1 of 1 positions shown; findings below may reference images not displayed]

FINDINGS: Endotracheal and nasogastric tubes are unchanged in position. Right
internal jugular catheter is unchanged. Left-sided pacemaker is
unchanged. Atherosclerosis of thoracic aorta is noted. No
pneumothorax is noted. Stable bibasilar opacities are noted
concerning for inflammation or atelectasis with small pleural
effusions. Bony thorax is unremarkable.
IMPRESSION: Stable support apparatus. Stable bibasilar opacities as described
above.

## 2020-07-17 ENCOUNTER — Other Ambulatory Visit: Payer: Self-pay | Admitting: Interventional Cardiology

## 2020-08-08 ENCOUNTER — Ambulatory Visit (INDEPENDENT_AMBULATORY_CARE_PROVIDER_SITE_OTHER): Payer: Medicare Other

## 2020-08-08 DIAGNOSIS — I428 Other cardiomyopathies: Secondary | ICD-10-CM | POA: Diagnosis not present

## 2020-08-09 LAB — CUP PACEART REMOTE DEVICE CHECK
Battery Remaining Longevity: 43 mo
Battery Voltage: 2.96 V
Brady Statistic AP VP Percent: 11.71 %
Brady Statistic AP VS Percent: 0.26 %
Brady Statistic AS VP Percent: 84 %
Brady Statistic AS VS Percent: 4.03 %
Brady Statistic RA Percent Paced: 14.28 %
Brady Statistic RV Percent Paced: 0.1 %
Date Time Interrogation Session: 20211004055101
Implantable Lead Implant Date: 20190717
Implantable Lead Implant Date: 20190717
Implantable Lead Implant Date: 20190717
Implantable Lead Location: 753858
Implantable Lead Location: 753859
Implantable Lead Location: 753860
Implantable Lead Model: 5076
Implantable Lead Model: 5076
Implantable Pulse Generator Implant Date: 20190717
Lead Channel Impedance Value: 1007 Ohm
Lead Channel Impedance Value: 1045 Ohm
Lead Channel Impedance Value: 1102 Ohm
Lead Channel Impedance Value: 1102 Ohm
Lead Channel Impedance Value: 1273 Ohm
Lead Channel Impedance Value: 342 Ohm
Lead Channel Impedance Value: 342 Ohm
Lead Channel Impedance Value: 418 Ohm
Lead Channel Impedance Value: 494 Ohm
Lead Channel Impedance Value: 494 Ohm
Lead Channel Impedance Value: 532 Ohm
Lead Channel Impedance Value: 646 Ohm
Lead Channel Impedance Value: 741 Ohm
Lead Channel Impedance Value: 855 Ohm
Lead Channel Pacing Threshold Amplitude: 0.625 V
Lead Channel Pacing Threshold Amplitude: 0.875 V
Lead Channel Pacing Threshold Amplitude: 3 V
Lead Channel Pacing Threshold Pulse Width: 0.4 ms
Lead Channel Pacing Threshold Pulse Width: 0.4 ms
Lead Channel Pacing Threshold Pulse Width: 0.6 ms
Lead Channel Sensing Intrinsic Amplitude: 2.25 mV
Lead Channel Sensing Intrinsic Amplitude: 2.25 mV
Lead Channel Sensing Intrinsic Amplitude: 9.375 mV
Lead Channel Sensing Intrinsic Amplitude: 9.375 mV
Lead Channel Setting Pacing Amplitude: 1.5 V
Lead Channel Setting Pacing Amplitude: 2 V
Lead Channel Setting Pacing Amplitude: 4.75 V
Lead Channel Setting Pacing Pulse Width: 0.4 ms
Lead Channel Setting Pacing Pulse Width: 0.6 ms
Lead Channel Setting Sensing Sensitivity: 1.2 mV

## 2020-08-11 NOTE — Progress Notes (Signed)
Remote pacemaker transmission.   

## 2020-08-12 ENCOUNTER — Inpatient Hospital Stay: Payer: Medicare Other | Attending: Internal Medicine

## 2020-08-12 ENCOUNTER — Other Ambulatory Visit: Payer: Self-pay

## 2020-08-12 DIAGNOSIS — Z79899 Other long term (current) drug therapy: Secondary | ICD-10-CM | POA: Insufficient documentation

## 2020-08-12 DIAGNOSIS — Z9221 Personal history of antineoplastic chemotherapy: Secondary | ICD-10-CM | POA: Diagnosis not present

## 2020-08-12 DIAGNOSIS — Z8719 Personal history of other diseases of the digestive system: Secondary | ICD-10-CM | POA: Insufficient documentation

## 2020-08-12 DIAGNOSIS — Z950 Presence of cardiac pacemaker: Secondary | ICD-10-CM | POA: Insufficient documentation

## 2020-08-12 DIAGNOSIS — Z85118 Personal history of other malignant neoplasm of bronchus and lung: Secondary | ICD-10-CM | POA: Diagnosis not present

## 2020-08-12 DIAGNOSIS — N184 Chronic kidney disease, stage 4 (severe): Secondary | ICD-10-CM | POA: Insufficient documentation

## 2020-08-12 DIAGNOSIS — C3490 Malignant neoplasm of unspecified part of unspecified bronchus or lung: Secondary | ICD-10-CM

## 2020-08-12 LAB — CBC WITH DIFFERENTIAL (CANCER CENTER ONLY)
Abs Immature Granulocytes: 0.02 10*3/uL (ref 0.00–0.07)
Basophils Absolute: 0.1 10*3/uL (ref 0.0–0.1)
Basophils Relative: 1 %
Eosinophils Absolute: 0.1 10*3/uL (ref 0.0–0.5)
Eosinophils Relative: 1 %
HCT: 35 % — ABNORMAL LOW (ref 36.0–46.0)
Hemoglobin: 11.8 g/dL — ABNORMAL LOW (ref 12.0–15.0)
Immature Granulocytes: 0 %
Lymphocytes Relative: 27 %
Lymphs Abs: 2.3 10*3/uL (ref 0.7–4.0)
MCH: 31.1 pg (ref 26.0–34.0)
MCHC: 33.7 g/dL (ref 30.0–36.0)
MCV: 92.1 fL (ref 80.0–100.0)
Monocytes Absolute: 0.6 10*3/uL (ref 0.1–1.0)
Monocytes Relative: 7 %
Neutro Abs: 5.3 10*3/uL (ref 1.7–7.7)
Neutrophils Relative %: 64 %
Platelet Count: 298 10*3/uL (ref 150–400)
RBC: 3.8 MIL/uL — ABNORMAL LOW (ref 3.87–5.11)
RDW: 12.5 % (ref 11.5–15.5)
WBC Count: 8.3 10*3/uL (ref 4.0–10.5)
nRBC: 0 % (ref 0.0–0.2)

## 2020-08-12 LAB — CMP (CANCER CENTER ONLY)
ALT: 23 U/L (ref 0–44)
AST: 21 U/L (ref 15–41)
Albumin: 3.9 g/dL (ref 3.5–5.0)
Alkaline Phosphatase: 94 U/L (ref 38–126)
Anion gap: 7 (ref 5–15)
BUN: 24 mg/dL — ABNORMAL HIGH (ref 8–23)
CO2: 26 mmol/L (ref 22–32)
Calcium: 9.7 mg/dL (ref 8.9–10.3)
Chloride: 110 mmol/L (ref 98–111)
Creatinine: 2.66 mg/dL — ABNORMAL HIGH (ref 0.44–1.00)
GFR, Estimated: 17 mL/min — ABNORMAL LOW (ref >60)
Glucose, Bld: 93 mg/dL (ref 70–99)
Potassium: 4 mmol/L (ref 3.5–5.1)
Sodium: 143 mmol/L (ref 135–145)
Total Bilirubin: 0.6 mg/dL (ref 0.3–1.2)
Total Protein: 7.2 g/dL (ref 6.5–8.1)

## 2020-08-15 ENCOUNTER — Encounter: Payer: Self-pay | Admitting: Internal Medicine

## 2020-08-15 ENCOUNTER — Other Ambulatory Visit: Payer: Self-pay

## 2020-08-15 ENCOUNTER — Inpatient Hospital Stay (HOSPITAL_BASED_OUTPATIENT_CLINIC_OR_DEPARTMENT_OTHER): Payer: Medicare Other | Admitting: Internal Medicine

## 2020-08-15 VITALS — BP 130/62 | HR 63 | Temp 97.8°F | Resp 17 | Ht 64.0 in | Wt 129.2 lb

## 2020-08-15 DIAGNOSIS — C3490 Malignant neoplasm of unspecified part of unspecified bronchus or lung: Secondary | ICD-10-CM

## 2020-08-15 DIAGNOSIS — C3491 Malignant neoplasm of unspecified part of right bronchus or lung: Secondary | ICD-10-CM

## 2020-08-15 DIAGNOSIS — Z85118 Personal history of other malignant neoplasm of bronchus and lung: Secondary | ICD-10-CM | POA: Diagnosis not present

## 2020-08-15 NOTE — Progress Notes (Signed)
Betty [name] [phone]   [phone]  OFFICE PROGRESS NOTE  Josetta Huddle, MD [address] [place] [address]  DIAGNOSIS: Non-small cell carcinoma of right lung, stage 1  Staging form: Lung, AJCC 7th Edition  Clinical: Stage IB (T2a, N0, M0) - Unsigned  PRIOR THERAPY:  1) Status post right upper thorascoscopic lobectomy on 04/29/2015 under the care of Dr. Roxan Hockey. 2) Adjuvant chemotherapy with carboplatin AUC 5 given on day 1 and gemcitabine 1000 mg/m2 given on days 1 and 8 every 3 weeks. Status post 4 cycles, last dose was given 09/07/2015.  CURRENT THERAPY: Observation.  INTERVAL HISTORY: Betty [name] 74 y.o. female returns to the clinic today for follow-up visit.  The patient is feeling fine today with no concerning complaints.  She denied having any chest pain, shortness of breath, cough or hemoptysis.  She denied having any fever or chills.  She has no nausea, vomiting, diarrhea or constipation.  She has no headache or visual changes.  She denied having any recent weight loss or night sweats.  She was supposed to have repeat CT scan of the chest before this visit but unfortunately it was not done. MEDICAL HISTORY: Past Medical History:  Diagnosis Date  . Acute combined systolic and diastolic (congestive) hrt fail ([place])   . Acute mastitis of right breast 10/2003  . Arthritis    bursitis in right shoulder (05/21/2018)  . 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 CKD (chronic kidney disease), stage IV ([place])   . Colon polyps   . 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 Dementia ([place]) 07/16/2018  . Family history of breast cancer   . GERD (gastroesophageal reflux disease)   . Hypercholesterolemia   . Hypertension   . Non-small cell carcinoma of right lung, stage 1 ([place]) 05/17/2015   Stage IB, right upper lobectomy 04/29/15, chemo   . NSTEMI (non-ST  elevated myocardial infarction) ([place]) 03/24/2018  . Presence of permanent cardiac pacemaker 05/21/2018  . Pure hypercholesterolemia   . Sciatica of right side   . Seasonal allergic rhinitis   . Tobacco dependence   . Tubular adenoma of colon   . Vitamin D deficiency     ALLERGIES:  is allergic to sulfa antibiotics.  MEDICATIONS:  Current Outpatient Medications  Medication Sig Dispense Refill  . acetaminophen (TYLENOL) 325 MG tablet Take 650 mg by mouth every 6 (six) hours as needed for mild pain or moderate pain.    Marland Kitchen aspirin EC 81 MG tablet Take 81 mg by mouth at bedtime.    Marland Kitchen atorvastatin (LIPITOR) 40 MG tablet TAKE 1 TABLET BY MOUTH EVERY DAY AT 6 PM 90 tablet 1  . Cholecalciferol (VITAMIN D) 2000 units tablet Take 1,000 Units by mouth at bedtime.     . donepezil (ARICEPT) 10 MG tablet Take 1 tablet (10 mg total) by mouth at bedtime. 90 tablet 1  . febuxostat (ULORIC) 40 MG tablet Take 40 mg by mouth at bedtime.     . furosemide (LASIX) 20 MG tablet Take 20 mg by mouth daily.    . isosorbide-hydrALAZINE (BIDIL) 20-37.5 MG tablet Take 1 tablet by mouth 3 (three) times daily. 270 tablet 3  . memantine (NAMENDA) 10 MG tablet Take 1 tablet (10 mg total) by mouth 2 (two) times daily. 180 tablet 3  . metoprolol succinate (TOPROL-XL) 50 MG 24 hr tablet Take 1 tablet (  50 mg total) by mouth daily. Take with or immediately following a meal. 90 tablet 3  . Multiple Vitamin (MULTIVITAMIN WITH MINERALS) TABS tablet Take 0.5 tablets by mouth at bedtime.    . nitroGLYCERIN (NITROSTAT) 0.4 MG SL tablet Place 1 tablet (0.4 mg total) under the tongue every 5 (five) minutes x 3 doses as needed for chest pain. 25 tablet 12  . omeprazole (PRILOSEC) 40 MG capsule Take 40 mg by mouth daily.    . sodium bicarbonate 650 MG tablet Take 1 tablet (650 mg total) by mouth 2 (two) times daily. 30 tablet 0   No current facility-administered medications for this visit.    SURGICAL HISTORY:  Past Surgical History:   Procedure Laterality Date  . BIOPSY  06/26/2019   Procedure: BIOPSY;  Surgeon: Wilford Corner, MD;  Location: [facility] ENDOSCOPY;  Service: Endoscopy;;  . BIV PACEMAKER INSERTION CRT-P N/A 05/21/2018   Procedure: BIV PACEMAKER INSERTION CRT-P;  Surgeon: Deboraha Sprang, MD;  Location: [place] CV LAB;  Service: Cardiovascular;  Laterality: N/A;  . BREAST SURGERY     small mass removed from left breast--benign  . CRYO INTERCOSTAL NERVE BLOCK Right 04/29/2015   Procedure: CRYO INTERCOSTAL NERVE BLOCK;  Surgeon: [name], MD;  Location: [place];  Service: Thoracic;  Laterality: Right;  . ESOPHAGOGASTRODUODENOSCOPY (EGD) WITH PROPOFOL N/A 06/26/2019   Procedure: ESOPHAGOGASTRODUODENOSCOPY (EGD) WITH PROPOFOL;  Surgeon: Wilford Corner, MD;  Location: [place];  Service: Endoscopy;  Laterality: N/A;  . INSERT / REPLACE / REMOVE PACEMAKER  05/21/2018  . LOBECTOMY Right 04/29/2015   Procedure: RIGHT LOWER LUNG LOBECTOMY ;  Surgeon: [name], MD;  Location: [place];  Service: Thoracic;  Laterality: Right;  . LYMPH NODE DISSECTION Right 04/29/2015   Procedure: RIGHT LUNG LYMPH NODE DISSECTION;  Surgeon: [name], MD;  Location: [place];  Service: Thoracic;  Laterality: Right;  . RIGHT/LEFT HEART CATH AND CORONARY ANGIOGRAPHY N/A 04/24/2018   Procedure: RIGHT/LEFT HEART CATH AND CORONARY ANGIOGRAPHY;  Surgeon: [name], MD;  Location: [place] CV LAB;  Service: Cardiovascular;  Laterality: N/A;  . VAGINAL DELIVERY     51 yrs ago  . VIDEO ASSISTED THORACOSCOPY (VATS)/ LOBECTOMY Right 04/29/2015   Procedure: RIGHT VIDEO ASSISTED THORACOSCOPY (VATS) WEDGE RESECTION/ RIGHT LOWER LOBECTOMY, CRYO-ANALGESIA OF INTERCOSTAL NERVES;  Surgeon: [name], MD;  Location: [place];  Service: Thoracic;  Laterality: Right;    REVIEW OF SYSTEMS:  A comprehensive review of systems was negative.   PHYSICAL EXAMINATION: General appearance: alert, cooperative and no  distress Head: Normocephalic, without obvious abnormality, atraumatic Neck: no adenopathy, no JVD, supple, symmetrical, trachea midline and thyroid not enlarged, symmetric, no tenderness/mass/nodules Lymph nodes: Cervical, supraclavicular, and axillary nodes normal. Resp: clear to auscultation bilaterally Back: symmetric, no curvature. ROM normal. No CVA tenderness. Cardio: regular rate and rhythm, S1, S2 normal, no murmur, click, rub or gallop GI: soft, non-tender; bowel sounds normal; no masses,  no organomegaly Extremities: extremities normal, atraumatic, no cyanosis or edema  ECOG PERFORMANCE STATUS: 1 - Symptomatic but completely ambulatory  Blood pressure 130/62, pulse 63, temperature 97.8 F (36.6 C), temperature source Tympanic, resp. rate 17, height 5\' 4"  (1.626 m), weight 129 lb 3.2 oz (58.6 kg), SpO2 100 %.  LABORATORY DATA: Lab Results  Component Value Date   WBC 8.3 08/12/2020   HGB 11.8 (L) 08/12/2020   HCT 35.0 (L) 08/12/2020   MCV 92.1 08/12/2020   PLT 298 08/12/2020  Chemistry      Component Value Date/Time   NA 143 08/12/2020 0948   NA 143 06/02/2018 1505   NA 145 09/18/2016 1057   K 4.0 08/12/2020 0948   K 4.9 09/18/2016 1057   CL 110 08/12/2020 0948   CO2 26 08/12/2020 0948   CO2 22 09/18/2016 1057   BUN 24 (H) 08/12/2020 0948   BUN 28 (H) 06/02/2018 1505   BUN 39.6 (H) 09/18/2016 1057   CREATININE 2.66 (H) 08/12/2020 0948   CREATININE 2.8 (H) 09/18/2016 1057      Component Value Date/Time   CALCIUM 9.7 08/12/2020 0948   CALCIUM 9.8 09/18/2016 1057   ALKPHOS 94 08/12/2020 0948   ALKPHOS 123 09/18/2016 1057   AST 21 08/12/2020 0948   AST 19 09/18/2016 1057   ALT 23 08/12/2020 0948   ALT 12 09/18/2016 1057   BILITOT 0.6 08/12/2020 0948   BILITOT 0.35 09/18/2016 1057       RADIOGRAPHIC STUDIES: CUP PACEART REMOTE DEVICE CHECK  Result Date: 08/09/2020 Scheduled remote reviewed. Normal device function. 1 NSVT episode, 37 beats on 06/20/20.  Routing for further review per protocol  Next remote 91 days- JBox, RN/CVRS   ASSESSMENT AND PLAN: This is a very pleasant 74 years old African-American female with a stage IB non-small cell lung cancer status post right lower lobectomy followed by 4 cycles of adjuvant chemotherapy with carboplatin and gemcitabine and tolerated her treatment fairly well. She has been in observation since November 2016. The patient is doing fine today with no concerning complaints. She was supposed to have repeat CT scan of the chest before this visit but this was not scheduled. I will arrange for the patient to get her scan done soon. If the scan showed no concerning findings for disease recurrence or metastasis, I will release her from the clinic with a routine follow-up visit by her primary care physician from now on.  I will be happy to see her in the future if needed. The patient was advised to call if she has any concerning symptoms. The patient voices understanding of current disease status and treatment options and is in agreement with the current care plan.  All questions were answered. The patient knows to call the clinic with any problems, questions or concerns. We can certainly see the patient much sooner if necessary.  Disclaimer: This note was dictated with voice recognition software. Similar sounding words can inadvertently be transcribed and may not be corrected upon review.

## 2020-08-17 NOTE — Progress Notes (Signed)
Cardiology Office Note   Date:  08/25/2020   ID:  Betty, [name] Dec 17, 1945, MRN [id number]  PCP:  Betty Huddle, MD  Cardiologist: Dr. Tamala Julian, MD   Chief Complaint  Patient presents with  . Follow-up    History of Present Illness: Betty [name] is a 74 y.o. female who presents for follow up, seen for Dr. Tamala [name].   Betty [name] has a hx of non-ischemic cardiomyopathy, chronic sysytolic heart failure, CKD stage IV, dementia and CRT-P placement 05/2018 for LV dysfunction (EF at 20>>>30-35% 06/2019). She was recently admitted and treated for COVID PNA requiring intubation.   She was then seen in follow up 01/11/2020 at which time she seemed to be doing well from a CV standpoint. No ARB/ARNI/ACEI due to renal insufficieny therefore she was continued on Bidil>>this was up-titrateded at last OV to TID dosing.   Today she presents with her son who seems to be her primary caregiver. She lives alone and is quite functional however he helps with appointments and sorting her medications. She has no complaints today. They are asking about Bidil as it is very expensive therefore we discussed splitting the meds separately which the son seems fine with.   Past Medical History:  Diagnosis Date  . Acute combined systolic and diastolic (congestive) hrt fail ([place])   . Acute mastitis of right breast 10/2003  . Arthritis    bursitis in right shoulder (05/21/2018)  . 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 CKD (chronic kidney disease), stage IV ([place])   . Colon polyps   . 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 Dementia ([place]) 07/16/2018  . Family history of breast cancer   . GERD (gastroesophageal reflux disease)   . Hypercholesterolemia   . Hypertension   . Non-small cell carcinoma of right lung, stage 1 ([place]) 05/17/2015   Stage IB, right upper lobectomy 04/29/15, chemo   . NSTEMI (non-ST elevated myocardial infarction) ([place])  03/24/2018  . Presence of permanent cardiac pacemaker 05/21/2018  . Pure hypercholesterolemia   . Sciatica of right side   . Seasonal allergic rhinitis   . Tobacco dependence   . Tubular adenoma of colon   . Vitamin D deficiency     Past Surgical History:  Procedure Laterality Date  . BIOPSY  06/26/2019   Procedure: BIOPSY;  Surgeon: Wilford Corner, MD;  Location: [facility] ENDOSCOPY;  Service: Endoscopy;;  . BIV PACEMAKER INSERTION CRT-P N/A 05/21/2018   Procedure: BIV PACEMAKER INSERTION CRT-P;  Surgeon: Deboraha Sprang, MD;  Location: [place] CV LAB;  Service: Cardiovascular;  Laterality: N/A;  . BREAST SURGERY     small mass removed from left breast--benign  . CRYO INTERCOSTAL NERVE BLOCK Right 04/29/2015   Procedure: CRYO INTERCOSTAL NERVE BLOCK;  Surgeon: [name], MD;  Location: [place];  Service: Thoracic;  Laterality: Right;  . ESOPHAGOGASTRODUODENOSCOPY (EGD) WITH PROPOFOL N/A 06/26/2019   Procedure: ESOPHAGOGASTRODUODENOSCOPY (EGD) WITH PROPOFOL;  Surgeon: Wilford Corner, MD;  Location: [place];  Service: Endoscopy;  Laterality: N/A;  . INSERT / REPLACE / REMOVE PACEMAKER  05/21/2018  . LOBECTOMY Right 04/29/2015   Procedure: RIGHT LOWER LUNG LOBECTOMY ;  Surgeon: [name], MD;  Location: [place];  Service: Thoracic;  Laterality: Right;  . LYMPH NODE DISSECTION Right 04/29/2015   Procedure: RIGHT LUNG LYMPH NODE DISSECTION;  Surgeon: [name], MD;  Location: [place];  Service: Thoracic;  Laterality:  Right;  Marland Kitchen RIGHT/LEFT HEART CATH AND CORONARY ANGIOGRAPHY N/A 04/24/2018   Procedure: RIGHT/LEFT HEART CATH AND CORONARY ANGIOGRAPHY;  Surgeon: [name], MD;  Location: [place] CV LAB;  Service: Cardiovascular;  Laterality: N/A;  . VAGINAL DELIVERY     51 yrs ago  . VIDEO ASSISTED THORACOSCOPY (VATS)/ LOBECTOMY Right 04/29/2015   Procedure: RIGHT VIDEO ASSISTED THORACOSCOPY (VATS) WEDGE RESECTION/ RIGHT LOWER LOBECTOMY, CRYO-ANALGESIA OF  INTERCOSTAL NERVES;  Surgeon: [name], MD;  Location: [place];  Service: Thoracic;  Laterality: Right;     Current Outpatient Medications  Medication Sig Dispense Refill  . acetaminophen (TYLENOL) 325 MG tablet Take 650 mg by mouth every 6 (six) hours as needed for mild pain or moderate pain.    Marland Kitchen aspirin EC 81 MG tablet Take 81 mg by mouth at bedtime.    Marland Kitchen atorvastatin (LIPITOR) 40 MG tablet TAKE 1 TABLET BY MOUTH EVERY DAY AT 6 PM 90 tablet 1  . Cholecalciferol (VITAMIN D) 2000 units tablet Take 1,000 Units by mouth at bedtime.     . donepezil (ARICEPT) 10 MG tablet Take 1 tablet (10 mg total) by mouth at bedtime. 90 tablet 1  . febuxostat (ULORIC) 40 MG tablet Take 40 mg by mouth at bedtime.     . isosorbide-hydrALAZINE (BIDIL) 20-37.5 MG tablet Take 1 tablet by mouth 3 (three) times daily. 270 tablet 3  . memantine (NAMENDA) 10 MG tablet Take 1 tablet (10 mg total) by mouth 2 (two) times daily. 180 tablet 3  . metoprolol succinate (TOPROL-XL) 50 MG 24 hr tablet Take 1 tablet (50 mg total) by mouth daily. Take with or immediately following a meal. 90 tablet 3  . Multiple Vitamin (MULTIVITAMIN WITH MINERALS) TABS tablet Take 0.5 tablets by mouth at bedtime.    . nitroGLYCERIN (NITROSTAT) 0.4 MG SL tablet Place 1 tablet (0.4 mg total) under the tongue every 5 (five) minutes x 3 doses as needed for chest pain. 25 tablet 12  . omeprazole (PRILOSEC) 40 MG capsule Take 40 mg by mouth daily.    . sodium bicarbonate 650 MG tablet Take 1 tablet (650 mg total) by mouth 2 (two) times daily. 30 tablet 0   No current facility-administered medications for this visit.    Allergies:   Sulfa antibiotics    Social History:  The patient  reports that she quit smoking about 5 years ago. Her smoking use included cigarettes. She has a 35.25 pack-year smoking history. She has never used smokeless tobacco. She reports current alcohol use. She reports that she does not use drugs.   Family History:   The patient'sfamily history includes Cancer in her father, sister, and sister; Dementia in her brother; Heart attack in her brother and brother; Other in her son.    ROS:  Please see the history of present illness.Otherwise, review of systems are positive for none. All other systems are reviewed and negative.    PHYSICAL EXAM: VS:  BP 124/68   Pulse 64   Ht 5\' 4"  (1.626 m)   Wt 130 lb 6.4 oz (59.1 kg)   SpO2 96%   BMI 22.38 kg/m  , BMI Body mass index is 22.38 kg/m.   General: Well developed, well nourished, NAD Neck: Negative for carotid bruits. No JVD Lungs:Clear to ausculation bilaterally. Breathing is unlabored. Cardiovascular: RRR with S1 S2. No murmurs  Extremities: No edema. Radial pulses 2+ bilaterally Neuro: Alert and oriented. No focal deficits. No facial asymmetry. MAE spontaneously. Psych: Responds  to questions appropriately with normal affect.     EKG:  EKG is ordered today.  Recent Labs: 08/12/2020: ALT 23; BUN 24; Creatinine 2.66; Hemoglobin 11.8; Platelet Count 298; Potassium 4.0; Sodium 143    Lipid Panel    Component Value Date/Time   CHOL 175 03/25/2018 0216   TRIG 103 03/25/2018 0216   HDL 39 (L) 03/25/2018 0216   CHOLHDL 4.5 03/25/2018 0216   VLDL 21 03/25/2018 0216   LDLCALC 115 (H) 03/25/2018 0216    Wt Readings from Last 3 Encounters:  08/25/20 130 lb 6.4 oz (59.1 kg)  08/15/20 129 lb 3.2 oz (58.6 kg)  03/29/20 130 lb (59 kg)    Other studies Reviewed: Additional studies/ records that were reviewed today include:   Review of the above records demonstrates:  Echocardiogram 06/2019:  1. The left ventricle has moderate-severely reduced systolic function,  with an ejection fraction of 30-35%. The cavity size was normal. There is  abnormal septal motion consistent with left bundle branch block. Left  ventricular diffuse hypokinesis.  2. The right ventricle has normal systolc function. The cavity was  normal. There is no increase in right  ventricular wall thickness. Right  ventricular systolic pressure could not be assessed.   ASSESSMENT AND PLAN:  1. Chronic systolic HF: -Most recent echo 06/2019 with mild improvement in LV function to 30-35% from 20% with the addition of CRT-P implantation  -Unable to use GDMT given renal insufficiency  -Due to cost, will split to hydralazine and isosorbide dinitrate separately -Appears euvolemic on exam today   2. Non-obstruction CAD: -Per [facility] 2019>>>continue current regimen -No chest pain or other anginal symptoms   3. Episode of NSVT on device check 08/09/20: -Noted on most recent device check  -37 beat episode per chart review  -Check labs today  -Continue Toprol 50mg  QD   Current medicines are reviewed at length with the patient today.  The patient does not have concerns regarding medicines.  The following changes have been made:  Stop bidil and start hydralazine and isosorbide dinitrate separately   Labs/ tests ordered today include: BMET No orders of the defined types were placed in this encounter.    Disposition:   FU with Dr. Tamala [name]  in 6 month  Signed, Kathyrn Drown, NP  08/25/2020 2:00 PM    [address], [place], [place]  [address] Phone: [phone]; Fax: [phone]

## 2020-08-25 ENCOUNTER — Other Ambulatory Visit: Payer: Self-pay

## 2020-08-25 ENCOUNTER — Encounter: Payer: Self-pay | Admitting: Cardiology

## 2020-08-25 ENCOUNTER — Ambulatory Visit (INDEPENDENT_AMBULATORY_CARE_PROVIDER_SITE_OTHER): Payer: Medicare Other | Admitting: Cardiology

## 2020-08-25 VITALS — BP 124/68 | HR 64 | Ht 64.0 in | Wt 130.4 lb

## 2020-08-25 DIAGNOSIS — I447 Left bundle-branch block, unspecified: Secondary | ICD-10-CM | POA: Diagnosis not present

## 2020-08-25 DIAGNOSIS — I5022 Chronic systolic (congestive) heart failure: Secondary | ICD-10-CM

## 2020-08-25 DIAGNOSIS — I428 Other cardiomyopathies: Secondary | ICD-10-CM

## 2020-08-25 DIAGNOSIS — I2510 Atherosclerotic heart disease of native coronary artery without angina pectoris: Secondary | ICD-10-CM

## 2020-08-25 DIAGNOSIS — I739 Peripheral vascular disease, unspecified: Secondary | ICD-10-CM

## 2020-08-25 MED ORDER — HYDRALAZINE HCL 25 MG PO TABS
37.5000 mg | ORAL_TABLET | Freq: Three times a day (TID) | ORAL | 3 refills | Status: DC
Start: 2020-08-25 — End: 2021-02-13

## 2020-08-25 MED ORDER — ISOSORBIDE DINITRATE 20 MG PO TABS: 20.0000 mg | ORAL_TABLET | Freq: Three times a day (TID) | ORAL | 3 refills | Status: DC

## 2020-08-25 NOTE — Patient Instructions (Signed)
Medication Instructions:  1. Stop taking the Bidil   2. We have sent in an Rx for isosorbide dinitrate 20 mg take one tablet by mouth three times a day  3. We have sent in an Rx for hydralazine 25 mg, take one and one half tablet by mouth three times a day  *If you need a refill on your cardiac medications before your next appointment, please call your pharmacy*   Lab Work: None If you have labs (blood work) drawn today and your tests are completely normal, you will receive your results only by: Marland Kitchen MyChart Message (if you have MyChart) OR . A paper copy in the mail If you have any lab test that is abnormal or we need to change your treatment, we will call you to review the results.   Testing/Procedures: Your physician has requested that you have an echocardiogram prior to your 6 month follow up appointment with Dr Tamala Julian. Echocardiography is a painless test that uses sound waves to create images of your heart. It provides your doctor with information about the size and shape of your heart and how well your heart's chambers and valves are working. This procedure takes approximately one hour. There are no restrictions for this procedure.     Follow-Up: At [facility], you and your health needs are our priority.  As part of our continuing mission to provide you with exceptional heart care, we have created designated Provider Care Teams.  These Care Teams include your primary Cardiologist (physician) and Advanced Practice Providers (APPs -  Physician Assistants and Nurse Practitioners) who all work together to provide you with the care you need, when you need it.   Your next appointment:   6 month(s)  The format for your next appointment:   In Person  Provider:   Daneen Schick, MD

## 2020-08-29 ENCOUNTER — Other Ambulatory Visit: Payer: Self-pay

## 2020-08-29 ENCOUNTER — Ambulatory Visit (HOSPITAL_COMMUNITY)
Admission: RE | Admit: 2020-08-29 | Discharge: 2020-08-29 | Disposition: A | Discharge status: Home / Self Care | Payer: Medicare Other | Source: Ambulatory Visit | Attending: Internal Medicine | Admitting: Internal Medicine

## 2020-08-29 DIAGNOSIS — C3490 Malignant neoplasm of unspecified part of unspecified bronchus or lung: Secondary | ICD-10-CM | POA: Insufficient documentation

## 2020-08-29 DIAGNOSIS — I700 Atherosclerosis of aorta: Secondary | ICD-10-CM | POA: Diagnosis not present

## 2020-08-29 DIAGNOSIS — K449 Diaphragmatic hernia without obstruction or gangrene: Secondary | ICD-10-CM | POA: Diagnosis not present

## 2020-08-29 DIAGNOSIS — N281 Cyst of kidney, acquired: Secondary | ICD-10-CM | POA: Insufficient documentation

## 2020-08-29 DIAGNOSIS — K8020 Calculus of gallbladder without cholecystitis without obstruction: Secondary | ICD-10-CM | POA: Insufficient documentation

## 2020-08-29 DIAGNOSIS — J439 Emphysema, unspecified: Secondary | ICD-10-CM | POA: Diagnosis not present

## 2020-09-13 ENCOUNTER — Ambulatory Visit: Payer: Medicare Other | Attending: Internal Medicine

## 2020-09-13 DIAGNOSIS — Z23 Encounter for immunization: Secondary | ICD-10-CM

## 2020-09-13 NOTE — Progress Notes (Signed)
   Covid-19 Vaccination Clinic  Name:  Betty [name]    MRN: [id number] DOB: 01/02/1946  09/13/2020  Betty [name] was observed post Covid-19 immunization for 15 minutes without incident. She was provided with Vaccine Information Sheet and instruction to access the V-Safe system.   Betty [name] was instructed to call 911 with any severe reactions post vaccine: Marland Kitchen Difficulty breathing  . Swelling of face and throat  . A fast heartbeat  . A bad rash all over body  . Dizziness and weakness

## 2020-09-27 DIAGNOSIS — N184 Chronic kidney disease, stage 4 (severe): Secondary | ICD-10-CM | POA: Diagnosis not present

## 2020-10-04 ENCOUNTER — Ambulatory Visit (INDEPENDENT_AMBULATORY_CARE_PROVIDER_SITE_OTHER): Payer: Medicare Other | Admitting: Neurology

## 2020-10-04 ENCOUNTER — Encounter: Payer: Self-pay | Admitting: Neurology

## 2020-10-04 VITALS — BP 126/80 | HR 72 | Ht 64.0 in | Wt 130.8 lb

## 2020-10-04 DIAGNOSIS — F0150 Vascular dementia without behavioral disturbance: Secondary | ICD-10-CM

## 2020-10-04 MED ORDER — MEMANTINE HCL 10 MG PO TABS: 10.0000 mg | ORAL_TABLET | Freq: Two times a day (BID) | ORAL | 3 refills | Status: DC

## 2020-10-04 MED ORDER — DONEPEZIL HCL 10 MG PO TABS: 10.0000 mg | ORAL_TABLET | Freq: Every day | ORAL | 3 refills | Status: DC

## 2020-10-04 NOTE — Progress Notes (Signed)
I have read the note, and I agree with the clinical assessment and plan.  Dru Primeau K Mostafa Yuan   

## 2020-10-04 NOTE — Progress Notes (Addendum)
PATIENT: Betty [name] DOB: [birth year]  REASON FOR VISIT: follow up HISTORY FROM: patient  HISTORY OF PRESENT ILLNESS: Today 10/04/20 Betty [name] is a 74 year old female with history of renal insufficiency and progressive memory disturbance.  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  She has a pacemaker.  Is on Aricept and Namenda.  Memory remains overall stable.  She continues to live alone, her son calls her every day, she takes her medications while they are on the phone.  Continues to do her own ADLs, housework.  Her son provides meals.  She is sleeping well, good appetite, weight remains stable.  Enjoys watching TV.  She does not drive, does not get out much, is cautious with Covid.  Overall health has been good.  Saw nephrology yesterday, everything was stable. Was going to have CTA in July with Dr. Estanislado Pandy, but was unable due to elevated creatinine, no scan was done.  This was for evaluation of aneurysm.  No falls, dizziness, or weakness.  Presents today for evaluation accompanied by her son, Betty [name].  HISTORY 03/29/2020 SS: [name] is a 74 year old female with history of renal insufficiency and progressive memory problem.  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  She has a pacemaker.  For her memory, she is on Aricept and Namenda.  Feels memory is overall stable.  She lives alone, her son check daily, he does the cooking.  He manages her medications.  She does her own ADLs, and some light housework.  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  She walks daily, half a mile or more with her son.  She sleeps well.  No recent falls.  Indicates, they have been unable to reach Dr. Estanislado Pandy, review of chart indicates MRA was scheduled back in December, but appointment was no-show.  Her overall health is good.  Denies any new problems or concerns.   Presents today for follow-up accompanied by her son.   REVIEW OF SYSTEMS: Out of a complete 14 system review of symptoms, the patient complains only of the following symptoms, and all other reviewed systems are negative.  Memory loss  ALLERGIES: Allergies  Allergen Reactions  . Sulfa Antibiotics Itching    HOME MEDICATIONS: Outpatient Medications Prior to Visit  Medication Sig Dispense Refill  . acetaminophen (TYLENOL) 325 MG tablet Take 650 mg by mouth every 6 (six) hours as needed for mild pain or moderate pain.    Marland Kitchen aspirin EC 81 MG tablet Take 81 mg by mouth at bedtime.    Marland Kitchen atorvastatin (LIPITOR) 40 MG tablet TAKE 1 TABLET BY MOUTH EVERY DAY AT 6 PM 90 tablet 1  . Cholecalciferol (VITAMIN D) 2000 units tablet Take 1,000 Units by mouth at bedtime.     . febuxostat (ULORIC) 40 MG tablet Take 40 mg by mouth at bedtime.     . hydrALAZINE (APRESOLINE) 25 MG tablet Take 1.5 tablets (37.5 mg total) by mouth 3 (three) times daily. 405 tablet 3  . isosorbide dinitrate (ISORDIL) 20 MG tablet Take 1 tablet (20 mg total) by mouth 3 (three) times daily. 270 tablet 3  . Multiple Vitamin (MULTIVITAMIN WITH MINERALS) TABS tablet Take 0.5 tablets by mouth at bedtime.    . nitroGLYCERIN (NITROSTAT) 0.4 MG SL tablet Place 1 tablet (0.4 mg total) under the tongue every 5 (five)  minutes x 3 doses as needed for chest pain. 25 tablet 12  . omeprazole (PRILOSEC) 40 MG capsule Take 40 mg by mouth daily.    . sodium bicarbonate 650 MG tablet Take 1 tablet (650 mg total) by mouth 2 (two) times daily. 30 tablet 0  . donepezil (ARICEPT) 10 MG tablet Take 1 tablet (10 mg total) by mouth at bedtime. 90 tablet 1  . memantine (NAMENDA) 10 MG tablet Take 1 tablet (10 mg total) by mouth 2 (two) times daily. 180 tablet 3  . metoprolol succinate (TOPROL-XL) 50 MG 24 hr tablet Take 1 tablet (50 mg total) by mouth daily. Take with or immediately following a meal. 90 tablet 3   No facility-administered medications prior  to visit.    PAST MEDICAL HISTORY: Past Medical History:  Diagnosis Date  . 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 Acute mastitis of right breast 10/2003  . Arthritis    bursitis in right shoulder (05/21/2018)  . 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 CKD (chronic kidney disease), stage IV ([place])   . Colon polyps   . 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 Dementia ([place]) 07/16/2018  . Family history of breast cancer   . GERD (gastroesophageal reflux disease)   . Hypercholesterolemia   . Hypertension   . Non-small cell carcinoma of right lung, stage 1 ([place]) 05/17/2015   Stage IB, right upper lobectomy 04/29/15, chemo   . NSTEMI (non-ST elevated myocardial infarction) ([place]) 03/24/2018  . Presence of permanent cardiac pacemaker 05/21/2018  . Pure hypercholesterolemia   . Sciatica of right side   . Seasonal allergic rhinitis   . Tobacco dependence   . Tubular adenoma of colon   . Vitamin D deficiency     PAST SURGICAL HISTORY: Past Surgical History:  Procedure Laterality Date  . BIOPSY  06/26/2019   Procedure: BIOPSY;  Surgeon: Wilford Corner, MD;  Location: [facility] ENDOSCOPY;  Service: Endoscopy;;  . BIV PACEMAKER INSERTION CRT-P N/A 05/21/2018   Procedure: BIV PACEMAKER INSERTION CRT-P;  Surgeon: Deboraha Sprang, MD;  Location: [place] CV LAB;  Service: Cardiovascular;  Laterality: N/A;  . BREAST SURGERY     small mass removed from left breast--benign  . CRYO INTERCOSTAL NERVE BLOCK Right 04/29/2015   Procedure: CRYO INTERCOSTAL NERVE BLOCK;  Surgeon: [name], MD;  Location: [place];  Service: Thoracic;  Laterality: Right;  . ESOPHAGOGASTRODUODENOSCOPY (EGD) WITH PROPOFOL N/A 06/26/2019   Procedure: ESOPHAGOGASTRODUODENOSCOPY (EGD) WITH PROPOFOL;  Surgeon: Wilford Corner, MD;  Location: [place];  Service: Endoscopy;  Laterality: N/A;  . INSERT / REPLACE / REMOVE  PACEMAKER  05/21/2018  . LOBECTOMY Right 04/29/2015   Procedure: RIGHT LOWER LUNG LOBECTOMY ;  Surgeon: [name], MD;  Location: [place];  Service: Thoracic;  Laterality: Right;  . LYMPH NODE DISSECTION Right 04/29/2015   Procedure: RIGHT LUNG LYMPH NODE DISSECTION;  Surgeon: [name], MD;  Location: [place];  Service: Thoracic;  Laterality: Right;  . RIGHT/LEFT HEART CATH AND CORONARY ANGIOGRAPHY N/A 04/24/2018   Procedure: RIGHT/LEFT HEART CATH AND CORONARY ANGIOGRAPHY;  Surgeon: [name], MD;  Location: [place] CV LAB;  Service: Cardiovascular;  Laterality: N/A;  . VAGINAL DELIVERY     51 yrs ago  . VIDEO ASSISTED THORACOSCOPY (VATS)/ LOBECTOMY Right 04/29/2015   Procedure: RIGHT VIDEO ASSISTED THORACOSCOPY (VATS) WEDGE RESECTION/ RIGHT LOWER LOBECTOMY, CRYO-ANALGESIA OF INTERCOSTAL  NERVES;  Surgeon: [name], MD;  Location: [place];  Service: Thoracic;  Laterality: Right;    FAMILY HISTORY: Family History  Problem Relation Age of Onset  . Cancer Father   . Cancer Sister   . Heart attack Brother   . Dementia Brother   . Other Son        bicuspid aortic valve  . Heart attack Brother   . Cancer Sister     SOCIAL HISTORY: Social History   Socioeconomic History  . Marital status: Single    Spouse name: Not on file  . Number of children: 1  . Years of education: 15  . Highest education level: Not on file  Occupational History  . Occupation: Retired  Tobacco Use  . Smoking status: Former Smoker    Packs/day: 0.75    Years: 47.00    Pack years: 35.25    Types: Cigarettes    Quit date: 04/29/2015    Years since quitting: 5.4  . Smokeless tobacco: Never Used  Vaping Use  . Vaping Use: Never used  Substance and Sexual Activity  . Alcohol use: Yes    Comment: 05/21/2018 "maybe 6 drinks/year"  . Drug use: Never  . Sexual activity: Not on file  Other Topics Concern  . Not on file  Social History Narrative   Lives alone   Caffeine  use: 1 cup coffee per day   Right handed    Social Determinants of Health   Financial Resource Strain:   . Difficulty of Paying Living Expenses: Not on file  Food Insecurity:   . Worried About Charity fundraiser in the Last Year: Not on file  . Ran Out of Food in the Last Year: Not on file  Transportation Needs:   . Lack of Transportation (Medical): Not on file  . Lack of Transportation (Non-Medical): Not on file  Physical Activity:   . Days of Exercise per Week: Not on file  . Minutes of Exercise per Session: Not on file  Stress:   . Feeling of Stress : Not on file  Social Connections:   . Frequency of Communication with Friends and Family: Not on file  . Frequency of Social Gatherings with Friends and Family: Not on file  . Attends Religious Services: Not on file  . Active Member of Clubs or Organizations: Not on file  . Attends Archivist Meetings: Not on file  . Marital Status: Not on file  Intimate Partner Violence:   . Fear of Current or Ex-Partner: Not on file  . Emotionally Abused: Not on file  . Physically Abused: Not on file  . Sexually Abused: Not on file   PHYSICAL EXAM  Vitals:   10/04/20 1245  BP: 126/80  Pulse: 72  Weight: 130 lb 12.8 oz (59.3 kg)  Height: 5\' 4"  (1.626 m)   Body mass index is 22.45 kg/m.  Generalized: Well developed, in no acute distress  MMSE - Mini Mental State Exam 10/04/2020 03/29/2020 09/29/2019  Orientation to time 1 1 2   Orientation to Place 5 5 4   Registration 3 1 3   Attention/ Calculation 0 2 1  Recall 1 0 0  Language- name 2 objects 2 2 2   Language- repeat - 1 1  Language- follow 3 step command - 3 3  Language- read & follow direction - 1 1  Write a sentence - 1 1  Copy design - 1 1  Copy design-comments - 7 animals named 10 animals  Total score - 18 19    Neurological examination  Mentation: Alert, most history is provided by her son, is cooperative, smiling, laughing, appears in good spirits,  well-appearing Cranial nerve II-XII: Pupils were equal round reactive to light. Extraocular movements were full, visual field were full on confrontational test. Facial sensation and strength were normal. Head turning and shoulder shrug  were normal and symmetric. Motor: The motor testing reveals 5 over 5 strength of all 4 extremities. Good symmetric motor tone is noted throughout.  Sensory: Sensory testing is intact to soft touch on all 4 extremities. No evidence of extinction is noted.  Coordination: Cerebellar testing reveals good finger-nose-finger and heel-to-shin bilaterally.  Gait and station: Gait is normal.  Reflexes: Deep tendon reflexes are symmetric and normal bilaterally.   DIAGNOSTIC DATA (LABS, IMAGING, TESTING) - I reviewed patient records, labs, notes, testing and imaging myself where available.  Lab Results  Component Value Date   WBC 8.3 08/12/2020   HGB 11.8 (L) 08/12/2020   HCT 35.0 (L) 08/12/2020   MCV 92.1 08/12/2020   PLT 298 08/12/2020      Component Value Date/Time   NA 143 08/12/2020 0948   NA 143 06/02/2018 1505   NA 145 09/18/2016 1057   K 4.0 08/12/2020 0948   K 4.9 09/18/2016 1057   CL 110 08/12/2020 0948   CO2 26 08/12/2020 0948   CO2 22 09/18/2016 1057   GLUCOSE 93 08/12/2020 0948   GLUCOSE 93 09/18/2016 1057   BUN 24 (H) 08/12/2020 0948   BUN 28 (H) 06/02/2018 1505   BUN 39.6 (H) 09/18/2016 1057   CREATININE 2.66 (H) 08/12/2020 0948   CREATININE 2.8 (H) 09/18/2016 1057   CALCIUM 9.7 08/12/2020 0948   CALCIUM 9.8 09/18/2016 1057   PROT 7.2 08/12/2020 0948   PROT 7.6 09/18/2016 1057   ALBUMIN 3.9 08/12/2020 0948   ALBUMIN 3.6 09/18/2016 1057   AST 21 08/12/2020 0948   AST 19 09/18/2016 1057   ALT 23 08/12/2020 0948   ALT 12 09/18/2016 1057   ALKPHOS 94 08/12/2020 0948   ALKPHOS 123 09/18/2016 1057   BILITOT 0.6 08/12/2020 0948   BILITOT 0.35 09/18/2016 1057   GFRNONAA 17 (L) 08/12/2020 0948   GFRAA 23 (L) 08/11/2019 1108   Lab  Results  Component Value Date   CHOL 175 03/25/2018   HDL 39 (L) 03/25/2018   LDLCALC 115 (H) 03/25/2018   TRIG 103 03/25/2018   CHOLHDL 4.5 03/25/2018   Lab Results  Component Value Date   HGBA1C 6.9 (H) 05/16/2019   No results found for: VITAMINB12 No results found for: TSH    ASSESSMENT AND PLAN 74 y.o. 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 ([place]), Acute mastitis of right breast (10/2003), Arthritis, Cerebral aneurysm, nonruptured (07/16/2018), CKD (chronic kidney disease), stage IV ([place]), Colon polyps, COPD (chronic obstructive pulmonary disease) ([place]), Dementia ([place]) (07/16/2018), Family history of breast cancer, GERD (gastroesophageal reflux disease), Hypercholesterolemia, Hypertension, Non-small cell carcinoma of right lung, stage 1 ([place]) (05/17/2015), NSTEMI (non-ST elevated myocardial infarction) ([place]) (03/24/2018), Presence of permanent cardiac pacemaker (05/21/2018), Pure hypercholesterolemia, Sciatica of right side, Seasonal allergic rhinitis, Tobacco dependence, Tubular adenoma of colon, and Vitamin D deficiency. here with:  1.  Dementia -Hard time completing MMSE, kept getting off topic -Continue Aricept 10 mg daily -Continue Namenda 10 mg twice a day -We discussed the importance of planning for the future, considering more supervision in the future -  Follow-up in 6 months or sooner if needed  2.  Right anterior communicating artery aneurysm -Went for CTA in July but creatinine was elevated, was not completed, has pacemaker -Will reach out to Dr. Estanislado Pandy about next steps (sent staff message to Robyne Peers, his PA)  Addendum 10/10/2020: Got a message back from Dr. Arlean Hopping PA, they will contact her nephrologist about moving forward with CTA. I called her son and let him know.   I spent 30 minutes of face-to-face and non-face-to-face time with patient.  This included previsit chart review, lab review, study  review, order entry, electronic health record documentation, patient education.  Butler Denmark, AGNP-C, DNP 10/04/2020, 1:19 PM [facility] [address], [place], [place] [address] [phone]

## 2020-10-04 NOTE — Patient Instructions (Signed)
Continue current medications Will try to get in touch with Dr. Arlean Hopping office and let you know See you back in 6 months

## 2020-10-14 ENCOUNTER — Telehealth (HOSPITAL_COMMUNITY): Payer: Self-pay | Admitting: Radiology

## 2020-10-14 NOTE — Telephone Encounter (Signed)
Called pt's son Chrissie Noa). Left VM requesting that he call me and give me the name of Ms. Faulconer's nephrologist so that I can get clearance on her having contrast with her follow-up CTA. JM

## 2020-10-18 ENCOUNTER — Other Ambulatory Visit: Payer: Self-pay

## 2020-10-18 MED ORDER — METOPROLOL SUCCINATE ER 50 MG PO TB24: 50.0000 mg | ORAL_TABLET | Freq: Every day | ORAL | 3 refills | Status: DC

## 2020-11-07 ENCOUNTER — Ambulatory Visit (INDEPENDENT_AMBULATORY_CARE_PROVIDER_SITE_OTHER): Payer: Medicare Other

## 2020-11-07 DIAGNOSIS — I428 Other cardiomyopathies: Secondary | ICD-10-CM | POA: Diagnosis not present

## 2020-11-08 LAB — CUP PACEART REMOTE DEVICE CHECK
Battery Remaining Longevity: 66 mo
Battery Voltage: 2.97 V
Brady Statistic AP VP Percent: 9.58 %
Brady Statistic AP VS Percent: 0.21 %
Brady Statistic AS VP Percent: 86.19 %
Brady Statistic AS VS Percent: 4.02 %
Brady Statistic RA Percent Paced: 11.99 %
Brady Statistic RV Percent Paced: 0.15 %
Date Time Interrogation Session: 20220102220555
Implantable Lead Implant Date: 20190717
Implantable Lead Implant Date: 20190717
Implantable Lead Implant Date: 20190717
Implantable Lead Location: 753858
Implantable Lead Location: 753859
Implantable Lead Location: 753860
Implantable Lead Model: 5076
Implantable Lead Model: 5076
Implantable Pulse Generator Implant Date: 20190717
Lead Channel Impedance Value: 1026 Ohm
Lead Channel Impedance Value: 1121 Ohm
Lead Channel Impedance Value: 1140 Ohm
Lead Channel Impedance Value: 1178 Ohm
Lead Channel Impedance Value: 323 Ohm
Lead Channel Impedance Value: 361 Ohm
Lead Channel Impedance Value: 399 Ohm
Lead Channel Impedance Value: 494 Ohm
Lead Channel Impedance Value: 570 Ohm
Lead Channel Impedance Value: 570 Ohm
Lead Channel Impedance Value: 570 Ohm
Lead Channel Impedance Value: 741 Ohm
Lead Channel Impedance Value: 912 Ohm
Lead Channel Impedance Value: 969 Ohm
Lead Channel Pacing Threshold Amplitude: 0.625 V
Lead Channel Pacing Threshold Amplitude: 0.875 V
Lead Channel Pacing Threshold Amplitude: 2.375 V
Lead Channel Pacing Threshold Pulse Width: 0.4 ms
Lead Channel Pacing Threshold Pulse Width: 0.4 ms
Lead Channel Pacing Threshold Pulse Width: 0.6 ms
Lead Channel Sensing Intrinsic Amplitude: 2.25 mV
Lead Channel Sensing Intrinsic Amplitude: 2.25 mV
Lead Channel Sensing Intrinsic Amplitude: 9.375 mV
Lead Channel Sensing Intrinsic Amplitude: 9.375 mV
Lead Channel Setting Pacing Amplitude: 1.5 V
Lead Channel Setting Pacing Amplitude: 2 V
Lead Channel Setting Pacing Amplitude: 3.75 V
Lead Channel Setting Pacing Pulse Width: 0.4 ms
Lead Channel Setting Pacing Pulse Width: 0.6 ms
Lead Channel Setting Sensing Sensitivity: 1.2 mV

## 2020-11-22 NOTE — Progress Notes (Signed)
Remote pacemaker transmission.   

## 2020-12-26 ENCOUNTER — Other Ambulatory Visit: Payer: Self-pay

## 2020-12-26 ENCOUNTER — Ambulatory Visit (HOSPITAL_COMMUNITY)
Admission: EM | Admit: 2020-12-26 | Discharge: 2020-12-26 | Disposition: A | Discharge status: Home / Self Care | Payer: Medicare Other | Attending: Internal Medicine | Admitting: Internal Medicine

## 2020-12-26 ENCOUNTER — Emergency Department (HOSPITAL_COMMUNITY): Payer: Medicare Other

## 2020-12-26 ENCOUNTER — Encounter (HOSPITAL_COMMUNITY): Payer: Self-pay | Admitting: Emergency Medicine

## 2020-12-26 ENCOUNTER — Emergency Department (HOSPITAL_COMMUNITY)
Admission: EM | Admit: 2020-12-26 | Discharge: 2020-12-27 | Disposition: A | Discharge status: Home / Self Care | Payer: Medicare Other | Attending: Emergency Medicine | Admitting: Emergency Medicine

## 2020-12-26 DIAGNOSIS — I2510 Atherosclerotic heart disease of native coronary artery without angina pectoris: Secondary | ICD-10-CM | POA: Diagnosis not present

## 2020-12-26 DIAGNOSIS — I5041 Acute combined systolic (congestive) and diastolic (congestive) heart failure: Secondary | ICD-10-CM | POA: Insufficient documentation

## 2020-12-26 DIAGNOSIS — Z85118 Personal history of other malignant neoplasm of bronchus and lung: Secondary | ICD-10-CM | POA: Insufficient documentation

## 2020-12-26 DIAGNOSIS — F0390 Unspecified dementia without behavioral disturbance: Secondary | ICD-10-CM | POA: Diagnosis not present

## 2020-12-26 DIAGNOSIS — R0602 Shortness of breath: Secondary | ICD-10-CM | POA: Diagnosis not present

## 2020-12-26 DIAGNOSIS — Z87891 Personal history of nicotine dependence: Secondary | ICD-10-CM | POA: Diagnosis not present

## 2020-12-26 DIAGNOSIS — Z20822 Contact with and (suspected) exposure to covid-19: Secondary | ICD-10-CM | POA: Diagnosis not present

## 2020-12-26 DIAGNOSIS — J449 Chronic obstructive pulmonary disease, unspecified: Secondary | ICD-10-CM | POA: Insufficient documentation

## 2020-12-26 DIAGNOSIS — R5381 Other malaise: Secondary | ICD-10-CM | POA: Diagnosis not present

## 2020-12-26 DIAGNOSIS — N184 Chronic kidney disease, stage 4 (severe): Secondary | ICD-10-CM | POA: Diagnosis not present

## 2020-12-26 DIAGNOSIS — Z950 Presence of cardiac pacemaker: Secondary | ICD-10-CM | POA: Diagnosis not present

## 2020-12-26 DIAGNOSIS — I130 Hypertensive heart and chronic kidney disease with heart failure and stage 1 through stage 4 chronic kidney disease, or unspecified chronic kidney disease: Secondary | ICD-10-CM | POA: Diagnosis not present

## 2020-12-26 LAB — BASIC METABOLIC PANEL
Anion gap: 12 (ref 5–15)
BUN: 26 mg/dL — ABNORMAL HIGH (ref 8–23)
CO2: 21 mmol/L — ABNORMAL LOW (ref 22–32)
Calcium: 9.3 mg/dL (ref 8.9–10.3)
Chloride: 107 mmol/L (ref 98–111)
Creatinine, Ser: 2.98 mg/dL — ABNORMAL HIGH (ref 0.44–1.00)
GFR, Estimated: 16 mL/min — ABNORMAL LOW (ref >60)
Glucose, Bld: 105 mg/dL — ABNORMAL HIGH (ref 70–99)
Potassium: 4.3 mmol/L (ref 3.5–5.1)
Sodium: 140 mmol/L (ref 135–145)

## 2020-12-26 LAB — CBC
HCT: 37.5 % (ref 36.0–46.0)
Hemoglobin: 12.1 g/dL (ref 12.0–15.0)
MCH: 30.1 pg (ref 26.0–34.0)
MCHC: 32.3 g/dL (ref 30.0–36.0)
MCV: 93.3 fL (ref 80.0–100.0)
Platelets: 277 10*3/uL (ref 150–400)
RBC: 4.02 MIL/uL (ref 3.87–5.11)
RDW: 12.1 % (ref 11.5–15.5)
WBC: 8.4 10*3/uL (ref 4.0–10.5)
nRBC: 0 % (ref 0.0–0.2)

## 2020-12-26 LAB — TROPONIN I (HIGH SENSITIVITY): Troponin I (High Sensitivity): 17 ng/L (ref <18)

## 2020-12-26 NOTE — ED Triage Notes (Signed)
Pt brought in today by son. She c/o SOB, denies CP. RR 26 02sat 97%RA

## 2020-12-26 NOTE — ED Triage Notes (Signed)
Pt arrives with son with chief complaint of shortness of breath that began suddenly today, denies CP, cough or fever. This has never happened before, she is labored in triage saturations 98% on room air.

## 2020-12-26 NOTE — ED Provider Notes (Signed)
[facility] Emergency Department Provider Note MRN:  [id number]  Arrival date & time: 12/26/20     Chief Complaint   Shortness of Breath   History of Present Illness   Betty [name] is a 75 y.o. year-old female with a history of CKD, dementia, CAD presenting to the ED with chief complaint of shortness of breath.  Malaise and shortness of breath reported earlier today, but patient feels fine now.  Patient has dementia and has having issue providing much history.  Denies any shortness of breath or chest pain at this time, does not remember having any chest pain earlier today.  Denies fever or cough, no abdominal pain, no headache or vision change, no numbness or weakness to the arms or legs.  Patient's son with some concerns about possible Covid.  I was unable to obtain an accurate HPI, PMH, or ROS due to the patient's dementia.  Level 5 caveat.  Review of Systems  A complete 10 system review of systems was obtained and all systems are negative except as noted in the HPI and PMH.   Patient's Health History    Past Medical History:  Diagnosis Date  . 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 Acute mastitis of right breast 10/2003  . Arthritis    bursitis in right shoulder (05/21/2018)  . 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 CKD (chronic kidney disease), stage IV ([place])   . Colon polyps   . 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 Dementia ([place]) 07/16/2018  . Family history of breast cancer   . GERD (gastroesophageal reflux disease)   . Hypercholesterolemia   . Hypertension   . Non-small cell carcinoma of right lung, stage 1 ([place]) 05/17/2015   Stage IB, right upper lobectomy 04/29/15, chemo   . NSTEMI (non-ST elevated myocardial infarction) ([place]) 03/24/2018  . Presence of permanent cardiac pacemaker 05/21/2018  . Pure hypercholesterolemia   . Sciatica of right  side   . Seasonal allergic rhinitis   . Tobacco dependence   . Tubular adenoma of colon   . Vitamin D deficiency     Past Surgical History:  Procedure Laterality Date  . BIOPSY  06/26/2019   Procedure: BIOPSY;  Surgeon: Wilford Corner, MD;  Location: [facility] ENDOSCOPY;  Service: Endoscopy;;  . BIV PACEMAKER INSERTION CRT-P N/A 05/21/2018   Procedure: BIV PACEMAKER INSERTION CRT-P;  Surgeon: Deboraha Sprang, MD;  Location: [place] CV LAB;  Service: Cardiovascular;  Laterality: N/A;  . BREAST SURGERY     small mass removed from left breast--benign  . CRYO INTERCOSTAL NERVE BLOCK Right 04/29/2015   Procedure: CRYO INTERCOSTAL NERVE BLOCK;  Surgeon: [name], MD;  Location: [place];  Service: Thoracic;  Laterality: Right;  . ESOPHAGOGASTRODUODENOSCOPY (EGD) WITH PROPOFOL N/A 06/26/2019   Procedure: ESOPHAGOGASTRODUODENOSCOPY (EGD) WITH PROPOFOL;  Surgeon: Wilford Corner, MD;  Location: [place];  Service: Endoscopy;  Laterality: N/A;  . INSERT / REPLACE / REMOVE PACEMAKER  05/21/2018  . LOBECTOMY Right 04/29/2015   Procedure: RIGHT LOWER LUNG LOBECTOMY ;  Surgeon: [name], MD;  Location: [place];  Service: Thoracic;  Laterality: Right;  . LYMPH NODE DISSECTION Right 04/29/2015   Procedure: RIGHT LUNG LYMPH NODE DISSECTION;  Surgeon: [name], MD;  Location: [place];  Service: Thoracic;  Laterality: Right;  . RIGHT/LEFT HEART CATH AND CORONARY ANGIOGRAPHY N/A 04/24/2018   Procedure: RIGHT/LEFT HEART  CATH AND CORONARY ANGIOGRAPHY;  Surgeon: [name], MD;  Location: [place] CV LAB;  Service: Cardiovascular;  Laterality: N/A;  . VAGINAL DELIVERY     51 yrs ago  . VIDEO ASSISTED THORACOSCOPY (VATS)/ LOBECTOMY Right 04/29/2015   Procedure: RIGHT VIDEO ASSISTED THORACOSCOPY (VATS) WEDGE RESECTION/ RIGHT LOWER LOBECTOMY, CRYO-ANALGESIA OF INTERCOSTAL NERVES;  Surgeon: [name], MD;  Location: [place];  Service: Thoracic;  Laterality: Right;     Family History  Problem Relation Age of Onset  . Cancer Father   . Cancer Sister   . Heart attack Brother   . Dementia Brother   . Other Son        bicuspid aortic valve  . Heart attack Brother   . Cancer Sister     Social History   Socioeconomic History  . Marital status: Single    Spouse name: Not on file  . Number of children: 1  . Years of education: 20  . Highest education level: Not on file  Occupational History  . Occupation: Retired  Tobacco Use  . Smoking status: Former Smoker    Packs/day: 0.75    Years: 47.00    Pack years: 35.25    Types: Cigarettes    Quit date: 04/29/2015    Years since quitting: 5.6  . Smokeless tobacco: Never Used  Vaping Use  . Vaping Use: Never used  Substance and Sexual Activity  . Alcohol use: Yes    Comment: 05/21/2018 "maybe 6 drinks/year"  . Drug use: Never  . Sexual activity: Not on file  Other Topics Concern  . Not on file  Social History Narrative   Lives alone   Caffeine use: 1 cup coffee per day   Right handed    Social Determinants of Health   Financial Resource Strain: Not on file  Food Insecurity: Not on file  Transportation Needs: Not on file  Physical Activity: Not on file  Stress: Not on file  Social Connections: Not on file  Intimate Partner Violence: Not on file     Physical Exam   Vitals:   12/26/20 1805 12/26/20 2118  BP: 138/66 (!) 150/71  Pulse: 81 69  Resp: (!) 22 (!) 30  Temp: 98.1 F (36.7 C) 98.2 F (36.8 C)  SpO2: 98% 98%    CONSTITUTIONAL: Well-appearing, NAD NEURO:  Alert, oriented to name, moves all extremities EYES:  eyes equal and reactive ENT/NECK:  no LAD, no JVD CARDIO: Regular rate, well-perfused, normal S1 and S2 PULM:  CTAB no wheezing or rhonchi, no increased work of breathing, normal respiratory rate GI/GU:  normal bowel sounds, non-distended, non-tender MSK/SPINE:  No gross deformities, no edema SKIN:  no rash, atraumatic PSYCH:  Appropriate speech and  behavior  *Additional and/or pertinent findings included in MDM below  Diagnostic and Interventional Summary    EKG Interpretation  Date/Time:  December 26, 2020 at 6:04 PM Ventricular Rate:  79 PR Interval:  116 QRS Duration: 162   QT Interval:  262 QTC Calculation: 300 R Axis:     Text Interpretation: Paced rhythm, no significant change from prior Confirmed by Dr. Gerlene Fee at 11:41 PM      Labs Reviewed  BASIC METABOLIC PANEL - Abnormal; Notable for the following components:      Result Value   CO2 21 (*)    Glucose, Bld 105 (*)    BUN 26 (*)    Creatinine, Ser 2.98 (*)    GFR, Estimated 16 (*)  All other components within normal limits  SARS CORONAVIRUS 2 (TAT 6-24 HRS)  CBC  TROPONIN I (HIGH SENSITIVITY)  TROPONIN I (HIGH SENSITIVITY)    DG Chest 2 View  Final Result      Medications - No data to display   Procedures  /  Critical Care Procedures  ED Course and Medical Decision Making  I have reviewed the triage vital signs, the nursing notes, and pertinent available records from the EMR.  Listed above are laboratory and imaging tests that I personally ordered, reviewed, and interpreted and then considered in my medical decision making (see below for details).  Report of shortness of breath or malaise earlier today but unwitnessed by son at bedside and patient does not remember.  History made challenging by patient's dementia.  She denies any symptoms at this time.  Does not think she had any chest pain earlier today.  Still given her age and comorbidities, work-up is appropriate.  Thus far EKG is unchanged, first troponin is negative, labs at or near baseline.  Some documented tachypnea during her weight but on my exam she has clear lungs, no increased work of breathing, not tachypneic.  No leg pain or swelling, no tachycardia, no hypoxia, doubt DVT or PE.  Awaiting second troponin and repeat vitals, doubt emergent process, anticipating discharge.  Will swab  for Covid and patient/son will follow-up results at home.       Barth Kirks. [place] Small, [place] [email or web address]  Final Clinical Impressions(s) / ED Diagnoses     ICD-10-CM   1. SOB (shortness of breath)  R06.02   2. Malaise  R53.81     ED Discharge Orders    None       Discharge Instructions Discussed with and Provided to Patient:   Discharge Instructions   None       Maudie Flakes, MD 12/26/20 2341

## 2020-12-27 LAB — TROPONIN I (HIGH SENSITIVITY): Troponin I (High Sensitivity): 18 ng/L — ABNORMAL HIGH (ref <18)

## 2020-12-27 LAB — SARS CORONAVIRUS 2 (TAT 6-24 HRS): SARS Coronavirus 2: NEGATIVE

## 2020-12-27 NOTE — Discharge Instructions (Addendum)
You were evaluated in the Emergency Department and after careful evaluation, we did not find any emergent condition requiring admission or further testing in the hospital.  Your exam/testing today was overall reassuring. Use MyChart to follow-up on your Covid test result.  Please return to the Emergency Department if you experience any worsening of your condition.  Thank you for allowing [place] to be a part of your care.

## 2020-12-27 NOTE — ED Notes (Signed)
Family at bedside for transportation

## 2021-01-10 ENCOUNTER — Other Ambulatory Visit: Payer: Self-pay | Admitting: Internal Medicine

## 2021-01-14 ENCOUNTER — Other Ambulatory Visit: Payer: Self-pay | Admitting: Interventional Cardiology

## 2021-01-23 DIAGNOSIS — N184 Chronic kidney disease, stage 4 (severe): Secondary | ICD-10-CM | POA: Diagnosis not present

## 2021-01-30 DIAGNOSIS — I129 Hypertensive chronic kidney disease with stage 1 through stage 4 chronic kidney disease, or unspecified chronic kidney disease: Secondary | ICD-10-CM | POA: Diagnosis not present

## 2021-01-30 DIAGNOSIS — N2581 Secondary hyperparathyroidism of renal origin: Secondary | ICD-10-CM | POA: Diagnosis not present

## 2021-01-30 DIAGNOSIS — N184 Chronic kidney disease, stage 4 (severe): Secondary | ICD-10-CM | POA: Diagnosis not present

## 2021-01-30 DIAGNOSIS — D631 Anemia in chronic kidney disease: Secondary | ICD-10-CM | POA: Diagnosis not present

## 2021-01-30 DIAGNOSIS — E872 Acidosis: Secondary | ICD-10-CM | POA: Diagnosis not present

## 2021-02-06 ENCOUNTER — Ambulatory Visit (INDEPENDENT_AMBULATORY_CARE_PROVIDER_SITE_OTHER): Payer: Medicare Other

## 2021-02-06 DIAGNOSIS — I428 Other cardiomyopathies: Secondary | ICD-10-CM | POA: Diagnosis not present

## 2021-02-07 LAB — CUP PACEART REMOTE DEVICE CHECK
Battery Remaining Longevity: 53 mo
Battery Voltage: 2.96 V
Brady Statistic AP VP Percent: 6.89 %
Brady Statistic AP VS Percent: 0.17 %
Brady Statistic AS VP Percent: 88.77 %
Brady Statistic AS VS Percent: 4.18 %
Brady Statistic RA Percent Paced: 9.18 %
Brady Statistic RV Percent Paced: 0.39 %
Date Time Interrogation Session: 20220403012102
Implantable Lead Implant Date: 20190717
Implantable Lead Implant Date: 20190717
Implantable Lead Implant Date: 20190717
Implantable Lead Location: 753858
Implantable Lead Location: 753859
Implantable Lead Location: 753860
Implantable Lead Model: 5076
Implantable Lead Model: 5076
Implantable Pulse Generator Implant Date: 20190717
Lead Channel Impedance Value: 1045 Ohm
Lead Channel Impedance Value: 1083 Ohm
Lead Channel Impedance Value: 1140 Ohm
Lead Channel Impedance Value: 342 Ohm
Lead Channel Impedance Value: 380 Ohm
Lead Channel Impedance Value: 437 Ohm
Lead Channel Impedance Value: 494 Ohm
Lead Channel Impedance Value: 494 Ohm
Lead Channel Impedance Value: 551 Ohm
Lead Channel Impedance Value: 608 Ohm
Lead Channel Impedance Value: 722 Ohm
Lead Channel Impedance Value: 836 Ohm
Lead Channel Impedance Value: 931 Ohm
Lead Channel Impedance Value: 950 Ohm
Lead Channel Pacing Threshold Amplitude: 0.75 V
Lead Channel Pacing Threshold Amplitude: 0.875 V
Lead Channel Pacing Threshold Amplitude: 3 V
Lead Channel Pacing Threshold Pulse Width: 0.4 ms
Lead Channel Pacing Threshold Pulse Width: 0.4 ms
Lead Channel Pacing Threshold Pulse Width: 0.6 ms
Lead Channel Sensing Intrinsic Amplitude: 18.375 mV
Lead Channel Sensing Intrinsic Amplitude: 18.375 mV
Lead Channel Sensing Intrinsic Amplitude: 2.625 mV
Lead Channel Sensing Intrinsic Amplitude: 2.625 mV
Lead Channel Setting Pacing Amplitude: 1.5 V
Lead Channel Setting Pacing Amplitude: 2 V
Lead Channel Setting Pacing Amplitude: 4 V
Lead Channel Setting Pacing Pulse Width: 0.4 ms
Lead Channel Setting Pacing Pulse Width: 0.6 ms
Lead Channel Setting Sensing Sensitivity: 1.2 mV

## 2021-02-08 ENCOUNTER — Other Ambulatory Visit: Payer: Self-pay

## 2021-02-08 ENCOUNTER — Ambulatory Visit (HOSPITAL_COMMUNITY): Payer: Medicare Other | Attending: Cardiology

## 2021-02-08 DIAGNOSIS — I2510 Atherosclerotic heart disease of native coronary artery without angina pectoris: Secondary | ICD-10-CM | POA: Insufficient documentation

## 2021-02-08 DIAGNOSIS — I428 Other cardiomyopathies: Secondary | ICD-10-CM | POA: Diagnosis not present

## 2021-02-08 DIAGNOSIS — I447 Left bundle-branch block, unspecified: Secondary | ICD-10-CM | POA: Diagnosis not present

## 2021-02-08 DIAGNOSIS — I739 Peripheral vascular disease, unspecified: Secondary | ICD-10-CM | POA: Insufficient documentation

## 2021-02-08 DIAGNOSIS — I5022 Chronic systolic (congestive) heart failure: Secondary | ICD-10-CM | POA: Diagnosis not present

## 2021-02-08 LAB — ECHOCARDIOGRAM COMPLETE
Area-P 1/2: 2.42 cm2
S' Lateral: 4.1 cm

## 2021-02-12 NOTE — Progress Notes (Signed)
Cardiology Office Note:    Date:  02/13/2021   ID:  [name], DOB 1945/12/09, MRN [id number]  PCP:  Josetta Huddle, [facility]  Cardiologist:  Sinclair Grooms, MD   Electrophysiologist:  Virl Axe, MD       Referring MD: Josetta Huddle, MD   Chief Complaint:  Follow-up (CHF)    Patient Profile:     Betty [name] is a 75 y.o. female with:   Coronary artery disease   Cath 6/19: pRCA 90 (small, non-dominant) >> Med Rx   (HFrEF) heart failure with reduced ejection fraction  Ischemic/non-ischemic cardiomyopathy   Echocardiogram 4/22: EF 25-30  S/p CRT-P  NSVT  Chronic kidney disease stage IV   LBBB  Dementia   Hx of COVID -19 w/ assoc pneumonia/ARDS in 05/2019 (intubated x 2, prolonged hosp stay)  Chronic kidney disease   COPD  Hypertension   Hyperlipidemia   [place] Lung CA, stage 1 (2016)  S/p RLL lobectomy   Prior CV studies: Echocardiogram 02/08/21 EF 25-30, Gr 1 DD, normal RVSF, RVSP 35.7, borderline dilated ascending aorta (37 mm)  Echocardiogram 06/25/2019 EF 30-35  Echocardiogram 04/22/2019 EF 30-35  RIGHT/LEFT HEART CATH AND CORONARY ANGIOGRAPHY 04/24/2018 Narrative  Prox RCA lesion is 90% stenosed.  NM Myocar Multi W/Spect W/Wall Motion / EF 03/25/2018 Narrative  There was no ST segment deviation noted during stress.  Findings consistent with ischemia.  This is a high risk study.  The left ventricular ejection fraction is severely decreased (<30%). Severe LVE with diffuse hypokinesis inferior basal dyskinesis EF 17% Poor quality study with stress images having diffusely less counts than rest Suggestion of septal apical distal anterior wall and inferior wall ischemia     History of Present Illness:    Ms. Pro was last seen in clinic by Kathyrn Drown, NP in 10/21.  She returns for f/u.  She is here with her son. She is doing well without chest pain, shortness of breath, syncope, orthopnea, leg edema.        Past Medical History:  Diagnosis Date  . Acute combined systolic and diastolic (congestive) hrt fail ([place])   . Acute mastitis of right breast 10/2003  . Arthritis    bursitis in right shoulder (05/21/2018)  . 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 CKD (chronic kidney disease), stage IV ([place])   . Colon polyps   . 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 Dementia ([place]) 07/16/2018  . Family history of breast cancer   . GERD (gastroesophageal reflux disease)   . Hypercholesterolemia   . Hypertension   . Non-small cell carcinoma of right lung, stage 1 ([place]) 05/17/2015   Stage IB, right upper lobectomy 04/29/15, chemo   . NSTEMI (non-ST elevated myocardial infarction) ([place]) 03/24/2018  . Presence of permanent cardiac pacemaker 05/21/2018  . Pure hypercholesterolemia   . Sciatica of right side   . Seasonal allergic rhinitis   . Tobacco dependence   . Tubular adenoma of colon   . Vitamin D deficiency     Current Medications: Current Meds  Medication Sig  . acetaminophen (TYLENOL) 325 MG tablet Take 650 mg by mouth every 6 (six) hours as needed for mild pain or moderate pain.  Marland Kitchen aspirin EC 81 MG tablet Take 81 mg by mouth at bedtime.  Marland Kitchen atorvastatin (LIPITOR) 40 MG tablet TAKE 1 TABLET BY MOUTH EVERY DAY AT 6 PM  .  Cholecalciferol (VITAMIN D) 2000 units tablet Take 1,000 Units by mouth at bedtime.   . donepezil (ARICEPT) 10 MG tablet Take 1 tablet (10 mg total) by mouth at bedtime.  . febuxostat (ULORIC) 40 MG tablet Take 40 mg by mouth at bedtime.   . hydrALAZINE (APRESOLINE) 50 MG tablet Take 1 tablet (50 mg total) by mouth 3 (three) times daily.  . isosorbide dinitrate (ISORDIL) 20 MG tablet Take 1 tablet (20 mg total) by mouth 3 (three) times daily.  . memantine (NAMENDA) 10 MG tablet Take 1 tablet (10 mg total) by mouth 2 (two) times daily.  . metoprolol succinate (TOPROL-XL) 50 MG 24 hr tablet Take 1 tablet  (50 mg total) by mouth daily. Take with or immediately following a meal.  . Multiple Vitamin (MULTIVITAMIN WITH MINERALS) TABS tablet Take 0.5 tablets by mouth at bedtime.  . nitroGLYCERIN (NITROSTAT) 0.4 MG SL tablet Place 1 tablet (0.4 mg total) under the tongue every 5 (five) minutes x 3 doses as needed for chest pain.  Marland Kitchen omeprazole (PRILOSEC) 40 MG capsule Take 40 mg by mouth daily.  . sodium bicarbonate 650 MG tablet Take 1 tablet (650 mg total) by mouth 2 (two) times daily.  . [DISCONTINUED] hydrALAZINE (APRESOLINE) 25 MG tablet Take 1.5 tablets (37.5 mg total) by mouth 3 (three) times daily.  . [DISCONTINUED] hydrALAZINE (APRESOLINE) 25 MG tablet Take 25 mg by mouth 3 (three) times daily.     Allergies:   Sulfa antibiotics   Social History   Tobacco Use  . Smoking status: Former Smoker    Packs/day: 0.75    Years: 47.00    Pack years: 35.25    Types: Cigarettes    Quit date: 04/29/2015    Years since quitting: 5.8  . Smokeless tobacco: Never Used  Vaping Use  . Vaping Use: Never used  Substance Use Topics  . Alcohol use: Yes    Comment: 05/21/2018 "maybe 6 drinks/year"  . Drug use: Never     Family Hx: The patient's family history includes Cancer in her father, sister, and sister; Dementia in her brother; Heart attack in her brother and brother; Other in her son.  ROS   EKGs/Labs/Other Test Reviewed:    EKG:  EKG is not ordered today.  The ekg ordered today demonstrates n/a  Recent Labs: 08/12/2020: ALT 23 12/26/2020: BUN 26; Creatinine, Ser 2.98; Hemoglobin 12.1; Platelets 277; Potassium 4.3; Sodium 140   Recent Lipid Panel Lab Results  Component Value Date/Time   CHOL 175 03/25/2018 02:16 AM   TRIG 103 03/25/2018 02:16 AM   HDL 39 (L) 03/25/2018 02:16 AM   CHOLHDL 4.5 03/25/2018 02:16 AM   LDLCALC 115 (H) 03/25/2018 02:16 AM      Risk Assessment/Calculations:      Physical Exam:    VS:  BP (!) 144/50   Pulse 68   Ht 5\' 4"  (1.626 m)   Wt 129 lb (58.5  kg)   SpO2 94%   BMI 22.14 kg/m     Wt Readings from Last 3 Encounters:  02/13/21 129 lb (58.5 kg)  10/04/20 130 lb 12.8 oz (59.3 kg)  08/25/20 130 lb 6.4 oz (59.1 kg)     Constitutional:      Appearance: Healthy appearance. Not in distress.  Neck:     Vascular: No JVR. JVD normal.  Pulmonary:     Effort: Pulmonary effort is normal.     Breath sounds: No wheezing. No rales.  Cardiovascular:  Normal rate. Regular rhythm. Normal S1. Normal S2.     Murmurs: There is no murmur.  Edema:    Peripheral edema absent.  Abdominal:     Palpations: Abdomen is soft. There is no hepatomegaly.  Skin:    General: Skin is warm and dry.  Neurological:     Mental Status: Alert and oriented to person, place and time.     Cranial Nerves: Cranial nerves are intact.          ASSESSMENT & PLAN:    1. HFrEF (heart failure with reduced ejection fraction) (HCC) EF 30.  Non-ischemic cardiomyopathy.  NYHA II.  Volume status stable.  GFR is < 20.  Therefore, we cannot use ACE/ARB/ARNI/MRA/SGLT2i.  Continue hydralazine, nitrates, beta-blocker.  Increase hydralazine to 50 mg three times a day.  She has an appt with Dr. Caryl Comes in ~ 6 mos.  Plan f/u with Dr. Tamala Julian in 1 year.   2. Coronary artery disease involving native coronary artery of native heart without angina pectoris 90% stenosis in a small non-dominant RCA.  She is not having angina.  Continue ASA, statin, beta-blocker.   3. Biventricular cardiac pacemaker in situ Continue f/u with EP as planned.   4. CKD (chronic kidney disease), stage IV ([place]) She is followed by Dr. Arman Filter at [facility].    5. Pure hypercholesterolemia Continue high intensity statin Rx.   6. Essential hypertension BP above target.  Increase hydralazine as noted.      Dispo:  Return in about 1 year (around 02/13/2022) for Routine Follow Up w/ Dr. Tamala Julian.   Medication Adjustments/Labs and Tests Ordered: Current medicines are reviewed at length with the patient  today.  Concerns regarding medicines are outlined above.  Tests Ordered: No orders of the defined types were placed in this encounter.  Medication Changes: Meds ordered this encounter  Medications  . hydrALAZINE (APRESOLINE) 50 MG tablet    Sig: Take 1 tablet (50 mg total) by mouth 3 (three) times daily.    Dispense:  270 tablet    Refill:  3    Order Specific Question:   Supervising Provider    Answer:   Lelon Perla [[id number]]    Signed, Richardson Dopp, PA-C  02/13/2021 4:16 PM    [facility] [place], [place], [place]  [address] Phone: [phone]; Fax: [phone]

## 2021-02-13 ENCOUNTER — Other Ambulatory Visit: Payer: Self-pay

## 2021-02-13 ENCOUNTER — Ambulatory Visit (INDEPENDENT_AMBULATORY_CARE_PROVIDER_SITE_OTHER): Payer: Medicare Other | Admitting: Physician Assistant

## 2021-02-13 ENCOUNTER — Encounter: Payer: Self-pay | Admitting: Physician Assistant

## 2021-02-13 VITALS — BP 144/50 | HR 68 | Ht 64.0 in | Wt 129.0 lb

## 2021-02-13 DIAGNOSIS — I10 Essential (primary) hypertension: Secondary | ICD-10-CM | POA: Diagnosis not present

## 2021-02-13 DIAGNOSIS — I2510 Atherosclerotic heart disease of native coronary artery without angina pectoris: Secondary | ICD-10-CM | POA: Diagnosis not present

## 2021-02-13 DIAGNOSIS — I5020 Unspecified systolic (congestive) heart failure: Secondary | ICD-10-CM | POA: Diagnosis not present

## 2021-02-13 DIAGNOSIS — E7800 Pure hypercholesterolemia, unspecified: Secondary | ICD-10-CM

## 2021-02-13 DIAGNOSIS — Z950 Presence of cardiac pacemaker: Secondary | ICD-10-CM

## 2021-02-13 DIAGNOSIS — N184 Chronic kidney disease, stage 4 (severe): Secondary | ICD-10-CM

## 2021-02-13 MED ORDER — HYDRALAZINE HCL 50 MG PO TABS: 50.0000 mg | ORAL_TABLET | Freq: Three times a day (TID) | ORAL | 3 refills | Status: DC

## 2021-02-13 NOTE — Patient Instructions (Signed)
Medication Instructions:  Increase Hydralazine to 50 mg three times a day   *If you need a refill on your cardiac medications before your next appointment, please call your pharmacy*   Follow-Up: At [facility], you and your health needs are our priority.  As part of our continuing mission to provide you with exceptional heart care, we have created designated Provider Care Teams.  These Care Teams include your primary Cardiologist (physician) and Advanced Practice Providers (APPs -  Physician Assistants and Nurse Practitioners) who all work together to provide you with the care you need, when you need it.  We recommend signing up for the patient portal called "MyChart".  Sign up information is provided on this After Visit Summary.  MyChart is used to connect with patients for Virtual Visits (Telemedicine).  Patients are able to view lab/test results, encounter notes, upcoming appointments, etc.  Non-urgent messages can be sent to your provider as well.   To learn more about what you can do with MyChart, go to [email or web address].    Your next appointment:   12 month(s)  The format for your next appointment:   In Person  Provider:   Daneen Schick, MD   Other Instructions

## 2021-02-16 NOTE — Progress Notes (Signed)
Remote pacemaker transmission.   

## 2021-03-09 DIAGNOSIS — Z0000 Encounter for general adult medical examination without abnormal findings: Secondary | ICD-10-CM | POA: Diagnosis not present

## 2021-03-09 DIAGNOSIS — I700 Atherosclerosis of aorta: Secondary | ICD-10-CM | POA: Diagnosis not present

## 2021-03-09 DIAGNOSIS — Z950 Presence of cardiac pacemaker: Secondary | ICD-10-CM | POA: Diagnosis not present

## 2021-03-09 DIAGNOSIS — Z1239 Encounter for other screening for malignant neoplasm of breast: Secondary | ICD-10-CM | POA: Diagnosis not present

## 2021-03-09 DIAGNOSIS — Z1389 Encounter for screening for other disorder: Secondary | ICD-10-CM | POA: Diagnosis not present

## 2021-03-09 DIAGNOSIS — I5020 Unspecified systolic (congestive) heart failure: Secondary | ICD-10-CM | POA: Diagnosis not present

## 2021-03-09 DIAGNOSIS — N184 Chronic kidney disease, stage 4 (severe): Secondary | ICD-10-CM | POA: Diagnosis not present

## 2021-03-21 DIAGNOSIS — Z1231 Encounter for screening mammogram for malignant neoplasm of breast: Secondary | ICD-10-CM | POA: Diagnosis not present

## 2021-03-30 ENCOUNTER — Emergency Department (HOSPITAL_COMMUNITY): Payer: Medicare Other

## 2021-03-30 ENCOUNTER — Encounter (HOSPITAL_COMMUNITY): Payer: Self-pay | Admitting: Emergency Medicine

## 2021-03-30 ENCOUNTER — Other Ambulatory Visit: Payer: Self-pay

## 2021-03-30 ENCOUNTER — Emergency Department (HOSPITAL_COMMUNITY)
Admission: EM | Admit: 2021-03-30 | Discharge: 2021-03-30 | Disposition: A | Discharge status: Home / Self Care | Payer: Medicare Other | Attending: Emergency Medicine | Admitting: Emergency Medicine

## 2021-03-30 DIAGNOSIS — R0602 Shortness of breath: Secondary | ICD-10-CM | POA: Insufficient documentation

## 2021-03-30 DIAGNOSIS — Z743 Need for continuous supervision: Secondary | ICD-10-CM | POA: Diagnosis not present

## 2021-03-30 DIAGNOSIS — Z20822 Contact with and (suspected) exposure to covid-19: Secondary | ICD-10-CM | POA: Insufficient documentation

## 2021-03-30 DIAGNOSIS — F0390 Unspecified dementia without behavioral disturbance: Secondary | ICD-10-CM | POA: Insufficient documentation

## 2021-03-30 DIAGNOSIS — Z79899 Other long term (current) drug therapy: Secondary | ICD-10-CM | POA: Diagnosis not present

## 2021-03-30 DIAGNOSIS — J449 Chronic obstructive pulmonary disease, unspecified: Secondary | ICD-10-CM | POA: Insufficient documentation

## 2021-03-30 DIAGNOSIS — R410 Disorientation, unspecified: Secondary | ICD-10-CM | POA: Insufficient documentation

## 2021-03-30 DIAGNOSIS — Z7982 Long term (current) use of aspirin: Secondary | ICD-10-CM | POA: Diagnosis not present

## 2021-03-30 DIAGNOSIS — I5041 Acute combined systolic (congestive) and diastolic (congestive) heart failure: Secondary | ICD-10-CM | POA: Insufficient documentation

## 2021-03-30 DIAGNOSIS — Z950 Presence of cardiac pacemaker: Secondary | ICD-10-CM | POA: Insufficient documentation

## 2021-03-30 DIAGNOSIS — N184 Chronic kidney disease, stage 4 (severe): Secondary | ICD-10-CM | POA: Diagnosis not present

## 2021-03-30 DIAGNOSIS — Z87891 Personal history of nicotine dependence: Secondary | ICD-10-CM | POA: Insufficient documentation

## 2021-03-30 DIAGNOSIS — I517 Cardiomegaly: Secondary | ICD-10-CM | POA: Diagnosis not present

## 2021-03-30 DIAGNOSIS — I130 Hypertensive heart and chronic kidney disease with heart failure and stage 1 through stage 4 chronic kidney disease, or unspecified chronic kidney disease: Secondary | ICD-10-CM | POA: Insufficient documentation

## 2021-03-30 DIAGNOSIS — R064 Hyperventilation: Secondary | ICD-10-CM | POA: Diagnosis not present

## 2021-03-30 DIAGNOSIS — R404 Transient alteration of awareness: Secondary | ICD-10-CM | POA: Diagnosis not present

## 2021-03-30 DIAGNOSIS — Z85118 Personal history of other malignant neoplasm of bronchus and lung: Secondary | ICD-10-CM | POA: Insufficient documentation

## 2021-03-30 DIAGNOSIS — R21 Rash and other nonspecific skin eruption: Secondary | ICD-10-CM | POA: Diagnosis not present

## 2021-03-30 LAB — CBC WITH DIFFERENTIAL/PLATELET
Abs Immature Granulocytes: 0.02 10*3/uL (ref 0.00–0.07)
Basophils Absolute: 0 10*3/uL (ref 0.0–0.1)
Basophils Relative: 0 %
Eosinophils Absolute: 0 10*3/uL (ref 0.0–0.5)
Eosinophils Relative: 0 %
HCT: 37.2 % (ref 36.0–46.0)
Hemoglobin: 12.4 g/dL (ref 12.0–15.0)
Immature Granulocytes: 0 %
Lymphocytes Relative: 15 %
Lymphs Abs: 1.6 10*3/uL (ref 0.7–4.0)
MCH: 30.6 pg (ref 26.0–34.0)
MCHC: 33.3 g/dL (ref 30.0–36.0)
MCV: 91.9 fL (ref 80.0–100.0)
Monocytes Absolute: 0.4 10*3/uL (ref 0.1–1.0)
Monocytes Relative: 4 %
Neutro Abs: 8 10*3/uL — ABNORMAL HIGH (ref 1.7–7.7)
Neutrophils Relative %: 81 %
Platelets: 269 10*3/uL (ref 150–400)
RBC: 4.05 MIL/uL (ref 3.87–5.11)
RDW: 12.3 % (ref 11.5–15.5)
WBC: 10.1 10*3/uL (ref 4.0–10.5)
nRBC: 0 % (ref 0.0–0.2)

## 2021-03-30 LAB — BASIC METABOLIC PANEL
Anion gap: 13 (ref 5–15)
BUN: 41 mg/dL — ABNORMAL HIGH (ref 8–23)
CO2: 20 mmol/L — ABNORMAL LOW (ref 22–32)
Calcium: 9.3 mg/dL (ref 8.9–10.3)
Chloride: 111 mmol/L (ref 98–111)
Creatinine, Ser: 2.99 mg/dL — ABNORMAL HIGH (ref 0.44–1.00)
GFR, Estimated: 16 mL/min — ABNORMAL LOW (ref >60)
Glucose, Bld: 101 mg/dL — ABNORMAL HIGH (ref 70–99)
Potassium: 4.1 mmol/L (ref 3.5–5.1)
Sodium: 144 mmol/L (ref 135–145)

## 2021-03-30 LAB — RESP PANEL BY RT-PCR (FLU A&B, COVID) ARPGX2
Influenza A by PCR: NEGATIVE
Influenza B by PCR: NEGATIVE
SARS Coronavirus 2 by RT PCR: NEGATIVE

## 2021-03-30 LAB — URINALYSIS, ROUTINE W REFLEX MICROSCOPIC
Bilirubin Urine: NEGATIVE
Glucose, UA: NEGATIVE mg/dL
Hgb urine dipstick: NEGATIVE
Ketones, ur: NEGATIVE mg/dL
Nitrite: NEGATIVE
Protein, ur: 30 mg/dL — AB
Specific Gravity, Urine: 1.011 (ref 1.005–1.030)
pH: 5 (ref 5.0–8.0)

## 2021-03-30 LAB — RAPID URINE DRUG SCREEN, HOSP PERFORMED
Amphetamines: NOT DETECTED
Barbiturates: NOT DETECTED
Benzodiazepines: NOT DETECTED
Cocaine: NOT DETECTED
Opiates: NOT DETECTED
Tetrahydrocannabinol: NOT DETECTED

## 2021-03-30 LAB — MAGNESIUM: Magnesium: 1.7 mg/dL (ref 1.7–2.4)

## 2021-03-30 NOTE — ED Provider Notes (Signed)
[facility] EMERGENCY DEPARTMENT Provider Note   CSN: [id number] Arrival date & time: 03/30/21  1951     History Chief Complaint  Patient presents with  . SOB/Confused    Dementia    Betty [name] is a 75 y.o. female.  HPI Patient presents with episode of confusion at a local store.  She does not recall the episode.  She states that she was leaving her house to drive across town to see her son who helps direct bands and that the last thing she remembers.  She says that she thinks she ate breakfast this morning but notes that in her old age sometimes she cannot remember these things.  She does not feel short of breath now.  No chest pain.  No lightheadedness, syncope, fevers, chills. Chart review shows history of dementia. Patient says she [place] she is at [place] but cannot state the month or season. Knows distant details; her name, DOB, birthplace. She also notes multiple times that she has insurance with the post office but does not recall telling me that whenever she repeats it. Police officer in ED lets me know that pt was reported missing and PD is trying to contact family.     Past Medical History:  Diagnosis Date  . Acute combined systolic and diastolic (congestive) hrt fail ([place])   . Acute mastitis of right breast 10/2003  . Arthritis    bursitis in right shoulder (05/21/2018)  . 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 CKD (chronic kidney disease), stage IV ([place])   . Colon polyps   . 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 Dementia ([place]) 07/16/2018  . Family history of breast cancer   . GERD (gastroesophageal reflux disease)   . Hypercholesterolemia   . Hypertension   . Non-small cell carcinoma of right lung, stage 1 ([place]) 05/17/2015   Stage IB, right upper lobectomy 04/29/15, chemo   . NSTEMI (non-ST elevated myocardial infarction) ([place]) 03/24/2018  . Presence of permanent cardiac  pacemaker 05/21/2018  . Pure hypercholesterolemia   . Sciatica of right side   . Seasonal allergic rhinitis   . Tobacco dependence   . Tubular adenoma of colon   . Vitamin D deficiency     Patient Active Problem List   Diagnosis Date Noted  . GI bleed 06/24/2019  . Hypotension 06/24/2019  . Electrolyte abnormality 06/24/2019  . Pressure injury of skin 05/29/2019  . Altered mental status   . Acute respiratory failure with hypoxemia ([place])   . COVID-19 05/14/2019  . Essential hypertension 05/14/2019  . Dementia ([place]) 07/16/2018  . Cerebral aneurysm, nonruptured 07/16/2018  . Nonischemic cardiomyopathy ([place]) 05/21/2018  . Congestive heart failure, NYHA class 3, chronic, systolic ([place]) 99/83/3825  . Chronic systolic heart failure ([place])   . Chest pain 04/22/2018  . 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 NSTEMI (non-ST elevated myocardial infarction) ([place]) 03/24/2018  . Pure hypercholesterolemia   . CKD (chronic kidney disease), stage IV ([place])   . Non-small cell carcinoma of right lung, stage 1 ([place]) 05/17/2015  . S/P lobectomy of lung 04/29/2015    Past Surgical History:  Procedure Laterality Date  . BIOPSY  06/26/2019   Procedure: BIOPSY;  Surgeon: Wilford Corner, MD;  Location: [facility] ENDOSCOPY;  Service: Endoscopy;;  . BIV PACEMAKER INSERTION CRT-P N/A 05/21/2018   Procedure: BIV PACEMAKER INSERTION CRT-P;  Surgeon: Deboraha Sprang, MD;  Location: [facility] CV LAB;  Service: Cardiovascular;  Laterality: N/A;  . BREAST SURGERY     small mass removed from left breast--benign  . CRYO INTERCOSTAL NERVE BLOCK Right 04/29/2015   Procedure: CRYO INTERCOSTAL NERVE BLOCK;  Surgeon: [name], MD;  Location: [facility];  Service: Thoracic;  Laterality: Right;  . ESOPHAGOGASTRODUODENOSCOPY (EGD) WITH PROPOFOL N/A 06/26/2019   Procedure: ESOPHAGOGASTRODUODENOSCOPY (EGD) WITH PROPOFOL;  Surgeon: Wilford Corner, MD;  Location: [facility];  Service: Endoscopy;   Laterality: N/A;  . INSERT / REPLACE / REMOVE PACEMAKER  05/21/2018  . LOBECTOMY Right 04/29/2015   Procedure: RIGHT LOWER LUNG LOBECTOMY ;  Surgeon: [name], MD;  Location: [facility];  Service: Thoracic;  Laterality: Right;  . LYMPH NODE DISSECTION Right 04/29/2015   Procedure: RIGHT LUNG LYMPH NODE DISSECTION;  Surgeon: [name], MD;  Location: [facility];  Service: Thoracic;  Laterality: Right;  . RIGHT/LEFT HEART CATH AND CORONARY ANGIOGRAPHY N/A 04/24/2018   Procedure: RIGHT/LEFT HEART CATH AND CORONARY ANGIOGRAPHY;  Surgeon: [name], MD;  Location: [facility] CV LAB;  Service: Cardiovascular;  Laterality: N/A;  . VAGINAL DELIVERY     51 yrs ago  . VIDEO ASSISTED THORACOSCOPY (VATS)/ LOBECTOMY Right 04/29/2015   Procedure: RIGHT VIDEO ASSISTED THORACOSCOPY (VATS) WEDGE RESECTION/ RIGHT LOWER LOBECTOMY, CRYO-ANALGESIA OF INTERCOSTAL NERVES;  Surgeon: [name], MD;  Location: [facility];  Service: Thoracic;  Laterality: Right;     OB History   No obstetric history on file.     Family History  Problem Relation Age of Onset  . Cancer Father   . Cancer Sister   . Heart attack Brother   . Dementia Brother   . Other Son        bicuspid aortic valve  . Heart attack Brother   . Cancer Sister     Social History   Tobacco Use  . Smoking status: Former Smoker    Packs/day: 0.75    Years: 47.00    Pack years: 35.25    Types: Cigarettes    Quit date: 04/29/2015    Years since quitting: 5.9  . Smokeless tobacco: Never Used  Vaping Use  . Vaping Use: Never used  Substance Use Topics  . Alcohol use: Yes    Comment: 05/21/2018 "maybe 6 drinks/year"  . Drug use: Never    Home Medications Prior to Admission medications   Medication Sig Start Date End Date Taking? Authorizing Provider  acetaminophen (TYLENOL) 325 MG tablet Take 650 mg by mouth every 6 (six) hours as needed for mild pain or moderate pain.    [provider]  aspirin EC 81 MG  tablet Take 81 mg by mouth at bedtime.    [provider]  atorvastatin (LIPITOR) 40 MG tablet TAKE 1 TABLET BY MOUTH EVERY DAY AT 6 PM 01/17/21   Belva Crome, MD  Cholecalciferol (VITAMIN D) 2000 units tablet Take 1,000 Units by mouth at bedtime.     [provider]  donepezil (ARICEPT) 10 MG tablet Take 1 tablet (10 mg total) by mouth at bedtime. 10/04/20   Suzzanne Cloud, NP  febuxostat (ULORIC) 40 MG tablet Take 40 mg by mouth at bedtime.     [provider]  hydrALAZINE (APRESOLINE) 50 MG tablet Take 1 tablet (50 mg total) by mouth 3 (three) times daily. 02/13/21 02/13/22  Richardson Dopp T, PA-C  isosorbide dinitrate (ISORDIL) 20 MG tablet Take 1 tablet (20 mg total) by mouth 3 (three) times daily. 08/25/20  Kathyrn Drown D, NP  memantine (NAMENDA) 10 MG tablet Take 1 tablet (10 mg total) by mouth 2 (two) times daily. 10/04/20   Suzzanne Cloud, NP  metoprolol succinate (TOPROL-XL) 50 MG 24 hr tablet Take 1 tablet (50 mg total) by mouth daily. Take with or immediately following a meal. 10/18/20   Belva Crome, MD  Multiple Vitamin (MULTIVITAMIN WITH MINERALS) TABS tablet Take 0.5 tablets by mouth at bedtime.    [provider]  nitroGLYCERIN (NITROSTAT) 0.4 MG SL tablet Place 1 tablet (0.4 mg total) under the tongue every 5 (five) minutes x 3 doses as needed for chest pain. 04/26/18   Daune Perch, NP  omeprazole (PRILOSEC) 40 MG capsule Take 40 mg by mouth daily.    [provider]  sodium bicarbonate 650 MG tablet Take 1 tablet (650 mg total) by mouth 2 (two) times daily. 07/02/19   [name] Latif, DO    Allergies    Sulfa antibiotics  Review of Systems   Review of Systems  Constitutional: Negative for chills and fever.  HENT: Negative for ear pain and sore throat.   Eyes: Negative for pain and visual disturbance.  Respiratory: Negative for cough and shortness of breath.   Cardiovascular: Negative for chest pain and palpitations.   Gastrointestinal: Negative for abdominal pain and vomiting.  Genitourinary: Negative for dysuria and hematuria.  Musculoskeletal: Negative for arthralgias and back pain.  Skin: Negative for color change and rash.  Neurological: Negative for seizures and syncope.  All other systems reviewed and are negative.   Physical Exam Updated Vital Signs BP (!) 150/68   Pulse 74   Temp 98.2 F (36.8 C) (Oral)   Resp 18   SpO2 98%   Physical Exam Vitals and nursing note reviewed.  Constitutional:      General: She is not in acute distress.    Appearance: She is well-developed.  HENT:     Head: Normocephalic and atraumatic.  Eyes:     Extraocular Movements: Extraocular movements intact.     Conjunctiva/sclera: Conjunctivae normal.     Pupils: Pupils are equal, round, and reactive to light.  Cardiovascular:     Rate and Rhythm: Normal rate and regular rhythm.     Heart sounds: No murmur heard.   Pulmonary:     Effort: Pulmonary effort is normal. No respiratory distress.     Breath sounds: Normal breath sounds.  Abdominal:     Palpations: Abdomen is soft.     Tenderness: There is no abdominal tenderness.  Musculoskeletal:     Cervical back: Normal range of motion and neck supple.     Right lower leg: No edema.     Left lower leg: No edema.  Skin:    General: Skin is warm and dry.  Neurological:     General: No focal deficit present.     Mental Status: She is alert.     Cranial Nerves: No cranial nerve deficit.     Motor: No weakness.  Psychiatric:        Behavior: Behavior normal.        Thought Content: Thought content normal.     ED Results / Procedures / Treatments   Labs (all labs ordered are listed, but only abnormal results are displayed) Labs Reviewed  CBC WITH DIFFERENTIAL/PLATELET - Abnormal; Notable for the following components:      Result Value   Neutro Abs 8.0 (*)    All other components within normal limits  BASIC METABOLIC PANEL - Abnormal; Notable for  the following components:   CO2 20 (*)    Glucose, Bld 101 (*)    BUN 41 (*)    Creatinine, Ser 2.99 (*)    GFR, Estimated 16 (*)    All other components within normal limits  URINALYSIS, ROUTINE W REFLEX MICROSCOPIC - Abnormal; Notable for the following components:   Protein, ur 30 (*)    Leukocytes,Ua SMALL (*)    Bacteria, UA RARE (*)    All other components within normal limits  RESP PANEL BY RT-PCR (FLU A&B, COVID) ARPGX2  URINE CULTURE  MAGNESIUM  RAPID URINE DRUG SCREEN, HOSP PERFORMED    EKG None  Radiology CT Head Wo Contrast  Result Date: 03/30/2021 CLINICAL DATA:  Confusion. EXAM: CT HEAD WITHOUT CONTRAST TECHNIQUE: Contiguous axial images were obtained from the base of the skull through the vertex without intravenous contrast. COMPARISON:  05/14/2019 FINDINGS: Brain: There is no evidence for acute hemorrhage, hydrocephalus, mass lesion, or abnormal extra-axial fluid collection. No definite CT evidence for acute infarction. Diffuse loss of parenchymal volume is consistent with atrophy. Patchy low attenuation in the deep hemispheric and periventricular white matter is nonspecific, but likely reflects chronic microvascular ischemic demyelination. Vascular: No hyperdense vessel or unexpected calcification. Skull: No evidence for fracture. No worrisome lytic or sclerotic lesion. Sinuses/Orbits: The visualized paranasal sinuses and mastoid air cells are clear. Visualized portions of the globes and intraorbital fat are unremarkable. Other: None. IMPRESSION: 1. No acute intracranial abnormality. 2. Atrophy with chronic small vessel white matter ischemic disease. Electronically Signed   By: Misty Stanley M.D.   On: 03/30/2021 21:07   DG Chest Portable 1 View  Result Date: 03/30/2021 CLINICAL DATA:  75 year old female with shortness of breath. EXAM: PORTABLE CHEST 1 VIEW COMPARISON:  Chest radiograph dated 12/26/2020. FINDINGS: No focal consolidation, pleural effusion, pneumothorax.  Mild cardiomegaly. Left pectoral pacemaker device. No acute osseous pathology. IMPRESSION: No active disease. Electronically Signed   By: Anner Crete M.D.   On: 03/30/2021 20:41    Procedures Procedures   Medications Ordered in ED Medications - No data to display  ED Course  I have reviewed the triage vital signs and the nursing notes.  Pertinent labs & imaging results that were available during my care of the patient were reviewed by me and considered in my medical decision making (see chart for details).    MDM Rules/Calculators/A&P                          Pt presents with brief confusion episode and SOB. Asymptomatic on eval. H&P c/w dementia. Will evaluate for other etiology with labs, ECG, CTH, CXR ECG reviewed by me; V- paced, no focal ischemic changes, regular CXR does not show any focal consolidation, pleural effusion, interstitial edema.  CT head results with no acute intercranial abnormality Labs overall unremarkable; creatinine stable, no evidence of urinary tract infection. Her son arrived at bedside and her history was confirmed.  She does have a history and physical overall consistent with dementia and has had issues with confusion chronically.  Our social worker came to discuss with him ways to keep the patient safe and he feels safe with her being discharged home.  Provided strict return precautions.  Final Clinical Impression(s) / ED Diagnoses Final diagnoses:  Confusion    Rx / DC Orders ED Discharge Orders    None       Aris Lot, MD  03/30/21 2303    Pattricia Boss, MD 03/30/21 2317

## 2021-03-30 NOTE — Discharge Instructions (Signed)
Please return for significant safety concerns or any emergency such as chest pain, trouble breathing, lightheadedness, passing out.

## 2021-03-30 NOTE — ED Triage Notes (Addendum)
Patient arrived with EMS from a local store staff called EMS , patient reports SOB this evening , confused and disoriented according to staff , hyperventilating with anxiety . Denies chest pain . CBG=108. Son stated patient has dementia.

## 2021-04-01 LAB — URINE CULTURE

## 2021-04-05 ENCOUNTER — Other Ambulatory Visit: Payer: Self-pay

## 2021-04-05 ENCOUNTER — Ambulatory Visit (INDEPENDENT_AMBULATORY_CARE_PROVIDER_SITE_OTHER): Payer: Medicare Other | Admitting: Neurology

## 2021-04-05 ENCOUNTER — Encounter: Payer: Self-pay | Admitting: Neurology

## 2021-04-05 VITALS — BP 140/75 | HR 62 | Ht 64.0 in | Wt 124.0 lb

## 2021-04-05 DIAGNOSIS — F0150 Vascular dementia without behavioral disturbance: Secondary | ICD-10-CM

## 2021-04-05 DIAGNOSIS — I671 Cerebral aneurysm, nonruptured: Secondary | ICD-10-CM

## 2021-04-05 MED ORDER — MEMANTINE HCL 10 MG PO TABS: 10.0000 mg | ORAL_TABLET | Freq: Two times a day (BID) | ORAL | 3 refills | Status: DC

## 2021-04-05 MED ORDER — DONEPEZIL HCL 10 MG PO TABS: 10.0000 mg | ORAL_TABLET | Freq: Every day | ORAL | 3 refills | Status: DC

## 2021-04-05 NOTE — Progress Notes (Signed)
I have read the note, and I agree with the clinical assessment and plan.  James Lafalce K Gissella Niblack   

## 2021-04-05 NOTE — Patient Instructions (Signed)
Referral in to IR  Continue Aricept and Namenda Discussed need for more supervision See you back in 8 months

## 2021-04-05 NOTE — Progress Notes (Signed)
PATIENT: Betty [name] DOB: 1946/10/09  REASON FOR VISIT: follow up HISTORY FROM: patient  HISTORY OF PRESENT ILLNESS: Today 04/05/21 Anette Guarneri this 75 year old female here today for follow-up for progressive memory disturbance, right anterior communicating artery aneurysm.  Has renal insufficiency with a pacemaker.  On Aricept and Namenda for memory. In the ER 03/30/21 for confusion, CT head stable. She doesn't remember this, her son called her at 3:30 she was at home, he went by her house at 4:30 she wasn't there, he couldn't find her, called police. Not sure how she got to [facility], but she was looking for her son, ended up in a shoe store, staff noticed she was confused called EMS. Now has outdoor cameras. Her son is checking into adult day care. Her is checking into getting POA. Health overall has been good. She doesn't drive. Today MMSE 17/30. Has been given something PRN for anxiety, not sure name. Sleeping well. Her housework has slipped, son does laundry, he brings meals.  Update 10/04/2020 SS: Betty [name] is a 75 year old female with history of renal insufficiency and progressive memory disturbance.  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  She has a pacemaker.  Is on Aricept and Namenda.  Memory remains overall stable.  She continues to live alone, her son calls her every day, she takes her medications while they are on the phone.  Continues to do her own ADLs, housework.  Her son provides meals.  She is sleeping well, good appetite, weight remains stable.  Enjoys watching TV.  She does not drive, does not get out much, is cautious with Covid.  Overall health has been good.  Saw nephrology yesterday, everything was stable. Was going to have CTA in July with Dr. Estanislado Pandy, but was unable due to elevated creatinine, no scan was done.  This was for evaluation of aneurysm.  No falls, dizziness, or weakness.  Presents today for evaluation  accompanied by her son, Betty [name].  HISTORY 03/29/2020 SS: Betty [name] is a 75 year old female with history of renal insufficiency and progressive memory problem.  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  She has a pacemaker.  For her memory, she is on Aricept and Namenda.  Feels memory is overall stable.  She lives alone, her son check daily, he does the cooking.  He manages her medications.  She does her own ADLs, and some light housework.  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  She walks daily, half a mile or more with her son.  She sleeps well.  No recent falls.  Indicates, they have been unable to reach Dr. Estanislado Pandy, review of chart indicates MRA was scheduled back in December, but appointment was no-show.  Her overall health is good.  Denies any new problems or concerns.  Presents today for follow-up accompanied by her son.   REVIEW OF SYSTEMS: Out of a complete 14 system review of symptoms, the patient complains only of the following symptoms, and all other reviewed systems are negative.  See HPI  ALLERGIES: Allergies  Allergen Reactions  . Sulfa Antibiotics Itching    HOME MEDICATIONS: Outpatient Medications Prior to Visit  Medication Sig Dispense Refill  . acetaminophen (TYLENOL) 325 MG tablet Take 650 mg by mouth every 6 (six) hours as needed for mild pain or moderate pain.    Marland Kitchen  aspirin EC 81 MG tablet Take 81 mg by mouth at bedtime.    Marland Kitchen atorvastatin (LIPITOR) 40 MG tablet TAKE 1 TABLET BY MOUTH EVERY DAY AT 6 PM 90 tablet 1  . Cholecalciferol (VITAMIN D) 2000 units tablet Take 1,000 Units by mouth at bedtime.     . donepezil (ARICEPT) 10 MG tablet Take 1 tablet (10 mg total) by mouth at bedtime. 90 tablet 3  . febuxostat (ULORIC) 40 MG tablet Take 40 mg by mouth at bedtime.     . hydrALAZINE (APRESOLINE) 50 MG tablet Take 1 tablet (50 mg total) by mouth 3 (three) times daily. 270  tablet 3  . isosorbide dinitrate (ISORDIL) 20 MG tablet Take 1 tablet (20 mg total) by mouth 3 (three) times daily. 270 tablet 3  . memantine (NAMENDA) 10 MG tablet Take 1 tablet (10 mg total) by mouth 2 (two) times daily. 180 tablet 3  . metoprolol succinate (TOPROL-XL) 50 MG 24 hr tablet Take 1 tablet (50 mg total) by mouth daily. Take with or immediately following a meal. 90 tablet 3  . Multiple Vitamin (MULTIVITAMIN WITH MINERALS) TABS tablet Take 0.5 tablets by mouth at bedtime.    . nitroGLYCERIN (NITROSTAT) 0.4 MG SL tablet Place 1 tablet (0.4 mg total) under the tongue every 5 (five) minutes x 3 doses as needed for chest pain. 25 tablet 12  . omeprazole (PRILOSEC) 40 MG capsule Take 40 mg by mouth daily.    . sodium bicarbonate 650 MG tablet Take 1 tablet (650 mg total) by mouth 2 (two) times daily. 30 tablet 0   No facility-administered medications prior to visit.    PAST MEDICAL HISTORY: Past Medical History:  Diagnosis Date  . 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 Acute mastitis of right breast 10/2003  . Arthritis    bursitis in right shoulder (05/21/2018)  . 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 CKD (chronic kidney disease), stage IV ([place])   . Colon polyps   . 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 Dementia ([place]) 07/16/2018  . Family history of breast cancer   . GERD (gastroesophageal reflux disease)   . Hypercholesterolemia   . Hypertension   . Non-small cell carcinoma of right lung, stage 1 ([place]) 05/17/2015   Stage IB, right upper lobectomy 04/29/15, chemo   . NSTEMI (non-ST elevated myocardial infarction) ([place]) 03/24/2018  . Presence of permanent cardiac pacemaker 05/21/2018  . Pure hypercholesterolemia   . Sciatica of right side   . Seasonal allergic rhinitis   . Tobacco dependence   . Tubular adenoma of colon   . Vitamin D deficiency     PAST SURGICAL  HISTORY: Past Surgical History:  Procedure Laterality Date  . BIOPSY  06/26/2019   Procedure: BIOPSY;  Surgeon: Wilford Corner, MD;  Location: [facility] ENDOSCOPY;  Service: Endoscopy;;  . BIV PACEMAKER INSERTION CRT-P N/A 05/21/2018   Procedure: BIV PACEMAKER INSERTION CRT-P;  Surgeon: Deboraha Sprang, MD;  Location: [place] CV LAB;  Service: Cardiovascular;  Laterality: N/A;  . BREAST SURGERY     small mass removed from left breast--benign  . CRYO INTERCOSTAL NERVE BLOCK Right 04/29/2015   Procedure: CRYO INTERCOSTAL NERVE BLOCK;  Surgeon: [name], MD;  Location: [place];  Service: Thoracic;  Laterality: Right;  . ESOPHAGOGASTRODUODENOSCOPY (EGD) WITH PROPOFOL N/A 06/26/2019   Procedure: ESOPHAGOGASTRODUODENOSCOPY (EGD) WITH PROPOFOL;  Surgeon: Wilford Corner, MD;  Location: [facility] ENDOSCOPY;  Service: Endoscopy;  Laterality: N/A;  . INSERT / REPLACE / REMOVE PACEMAKER  05/21/2018  . LOBECTOMY Right 04/29/2015   Procedure: RIGHT LOWER LUNG LOBECTOMY ;  Surgeon: [name], MD;  Location: [place];  Service: Thoracic;  Laterality: Right;  . LYMPH NODE DISSECTION Right 04/29/2015   Procedure: RIGHT LUNG LYMPH NODE DISSECTION;  Surgeon: [name], MD;  Location: [place];  Service: Thoracic;  Laterality: Right;  . RIGHT/LEFT HEART CATH AND CORONARY ANGIOGRAPHY N/A 04/24/2018   Procedure: RIGHT/LEFT HEART CATH AND CORONARY ANGIOGRAPHY;  Surgeon: [name], MD;  Location: [place] CV LAB;  Service: Cardiovascular;  Laterality: N/A;  . VAGINAL DELIVERY     51 yrs ago  . VIDEO ASSISTED THORACOSCOPY (VATS)/ LOBECTOMY Right 04/29/2015   Procedure: RIGHT VIDEO ASSISTED THORACOSCOPY (VATS) WEDGE RESECTION/ RIGHT LOWER LOBECTOMY, CRYO-ANALGESIA OF INTERCOSTAL NERVES;  Surgeon: [name], MD;  Location: [place];  Service: Thoracic;  Laterality: Right;    FAMILY HISTORY: Family History  Problem Relation Age of Onset  . Cancer Father   . Cancer Sister   . Heart  attack Brother   . Dementia Brother   . Other Son        bicuspid aortic valve  . Heart attack Brother   . Cancer Sister     SOCIAL HISTORY: Social History   Socioeconomic History  . Marital status: Single    Spouse name: Not on file  . Number of children: 1  . Years of education: 9  . Highest education level: Not on file  Occupational History  . Occupation: Retired  Tobacco Use  . Smoking status: Former Smoker    Packs/day: 0.75    Years: 47.00    Pack years: 35.25    Types: Cigarettes    Quit date: 04/29/2015    Years since quitting: 5.9  . Smokeless tobacco: Never Used  Vaping Use  . Vaping Use: Never used  Substance and Sexual Activity  . Alcohol use: Yes    Comment: 05/21/2018 "maybe 6 drinks/year"  . Drug use: Never  . Sexual activity: Not on file  Other Topics Concern  . Not on file  Social History Narrative   Lives alone   Caffeine use: 1 cup coffee per day   Right handed    Social Determinants of Health   Financial Resource Strain: Not on file  Food Insecurity: Not on file  Transportation Needs: Not on file  Physical Activity: Not on file  Stress: Not on file  Social Connections: Not on file  Intimate Partner Violence: Not on file   PHYSICAL EXAM  Vitals:   04/05/21 1111  BP: 140/75  Pulse: 62  Weight: 124 lb (56.2 kg)  Height: 5\' 4"  (1.626 m)   Body mass index is 21.28 kg/m.  Generalized: Well developed, in no acute distress  MMSE - Mini Mental State Exam 04/05/2021 10/04/2020 03/29/2020  Orientation to time 0 1 1  Orientation to Place 5 5 5   Registration 3 3 1   Attention/ Calculation 1 0 2  Recall 0 1 0  Language- name 2 objects 2 2 2   Language- repeat 1 - 1  Language- follow 3 step command 3 - 3  Language- read & follow direction 1 - 1  Write a sentence 1 - 1  Copy design 0 - 1  Copy design-comments - - 7 animals  Total score 17 - 18    Neurological examination  Mentation: Alert, most  history is provided by her son, is  cooperative, in good spirits Cranial nerve II-XII: Pupils were equal round reactive to light. Extraocular movements were full, visual field were full on confrontational test. Facial sensation and strength were normal. Head turning and shoulder shrug  were normal and symmetric. Motor: The motor testing reveals 5 over 5 strength of all 4 extremities. Good symmetric motor tone is noted throughout.  Sensory: Sensory testing is intact to soft touch on all 4 extremities. No evidence of extinction is noted.  Coordination: Cerebellar testing reveals good finger-nose-finger and heel-to-shin bilaterally.  Gait and station: Gait is normal.  Reflexes: Deep tendon reflexes are symmetric and normal bilaterally.   DIAGNOSTIC DATA (LABS, IMAGING, TESTING) - I reviewed patient records, labs, notes, testing and imaging myself where available.  Lab Results  Component Value Date   WBC 10.1 03/30/2021   HGB 12.4 03/30/2021   HCT 37.2 03/30/2021   MCV 91.9 03/30/2021   PLT 269 03/30/2021      Component Value Date/Time   NA 144 03/30/2021 2006   NA 143 06/02/2018 1505   NA 145 09/18/2016 1057   K 4.1 03/30/2021 2006   K 4.9 09/18/2016 1057   CL 111 03/30/2021 2006   CO2 20 (L) 03/30/2021 2006   CO2 22 09/18/2016 1057   GLUCOSE 101 (H) 03/30/2021 2006   GLUCOSE 93 09/18/2016 1057   BUN 41 (H) 03/30/2021 2006   BUN 28 (H) 06/02/2018 1505   BUN 39.6 (H) 09/18/2016 1057   CREATININE 2.99 (H) 03/30/2021 2006   CREATININE 2.66 (H) 08/12/2020 0948   CREATININE 2.8 (H) 09/18/2016 1057   CALCIUM 9.3 03/30/2021 2006   CALCIUM 9.8 09/18/2016 1057   PROT 7.2 08/12/2020 0948   PROT 7.6 09/18/2016 1057   ALBUMIN 3.9 08/12/2020 0948   ALBUMIN 3.6 09/18/2016 1057   AST 21 08/12/2020 0948   AST 19 09/18/2016 1057   ALT 23 08/12/2020 0948   ALT 12 09/18/2016 1057   ALKPHOS 94 08/12/2020 0948   ALKPHOS 123 09/18/2016 1057   BILITOT 0.6 08/12/2020 0948   BILITOT 0.35 09/18/2016 1057   GFRNONAA 16 (L)  03/30/2021 2006   GFRNONAA 17 (L) 08/12/2020 0948   GFRAA 23 (L) 08/11/2019 1108   Lab Results  Component Value Date   CHOL 175 03/25/2018   HDL 39 (L) 03/25/2018   LDLCALC 115 (H) 03/25/2018   TRIG 103 03/25/2018   CHOLHDL 4.5 03/25/2018   Lab Results  Component Value Date   HGBA1C 6.9 (H) 05/16/2019   No results found for: VITAMINB12 No results found for: TSH    ASSESSMENT AND PLAN 75 y.o. 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 ([place]), Acute mastitis of right breast (10/2003), Arthritis, Cerebral aneurysm, nonruptured (07/16/2018), CKD (chronic kidney disease), stage IV ([place]), Colon polyps, COPD (chronic obstructive pulmonary disease) ([place]), Dementia ([place]) (07/16/2018), Family history of breast cancer, GERD (gastroesophageal reflux disease), Hypercholesterolemia, Hypertension, Non-small cell carcinoma of right lung, stage 1 ([place]) (05/17/2015), NSTEMI (non-ST elevated myocardial infarction) ([place]) (03/24/2018), Presence of permanent cardiac pacemaker (05/21/2018), Pure hypercholesterolemia, Sciatica of right side, Seasonal allergic rhinitis, Tobacco dependence, Tubular adenoma of colon, and Vitamin D deficiency. here with:  1.  Dementia -MMSE 17/30 today, recent incidence of wandering out of the house -Continue Aricept 10 mg daily -Continue Namenda 10 mg twice a day -Discussed the need for further supervision, her son is checking into obtaining POA, adult daycare, sitter,  given dementia packet today -Doesn't drive, but still living alone, this needs to be closely monitored -Follow-up in 8 months or sooner if needed  2.  Right anterior communicating artery aneurysm -Went for CTA in July 2021 but creatinine was elevated, was not completed, has pacemaker -I reached out to Dr. Arlean Hopping PA after last appointment, was told they would contact her nephrologist about moving forward with CTA, her son indicates they did not  hear anything -Not clear anything would actually be done, given her age, Dementia, renal insufficiency, pacemaker, however will refer to IR, see if appointment can be arranged for consult with IR team, to discuss options if any with patient and her son   I spent 35 minutes of face-to-face and non-face-to-face time with patient.  This included previsit chart review, discussing recent wandering episode, need for more supervision at home, community resources, plan going forward for known aneurysm, action steps.  Butler Denmark, AGNP-C, DNP 04/05/2021, 11:16 AM [facility] [address] [place], [place] [address] [phone]

## 2021-05-09 ENCOUNTER — Ambulatory Visit (INDEPENDENT_AMBULATORY_CARE_PROVIDER_SITE_OTHER): Payer: Medicare Other

## 2021-05-09 DIAGNOSIS — I428 Other cardiomyopathies: Secondary | ICD-10-CM

## 2021-05-09 LAB — CUP PACEART REMOTE DEVICE CHECK
Battery Remaining Longevity: 48 mo
Battery Voltage: 2.96 V
Brady Statistic AP VP Percent: 8.87 %
Brady Statistic AP VS Percent: 0.2 %
Brady Statistic AS VP Percent: 86.87 %
Brady Statistic AS VS Percent: 4.07 %
Brady Statistic RA Percent Paced: 11.15 %
Brady Statistic RV Percent Paced: 0.19 %
Date Time Interrogation Session: 20220703064557
Implantable Lead Implant Date: 20190717
Implantable Lead Implant Date: 20190717
Implantable Lead Implant Date: 20190717
Implantable Lead Location: 753858
Implantable Lead Location: 753859
Implantable Lead Location: 753860
Implantable Lead Model: 5076
Implantable Lead Model: 5076
Implantable Pulse Generator Implant Date: 20190717
Lead Channel Impedance Value: 1045 Ohm
Lead Channel Impedance Value: 1102 Ohm
Lead Channel Impedance Value: 1121 Ohm
Lead Channel Impedance Value: 1140 Ohm
Lead Channel Impedance Value: 342 Ohm
Lead Channel Impedance Value: 361 Ohm
Lead Channel Impedance Value: 418 Ohm
Lead Channel Impedance Value: 494 Ohm
Lead Channel Impedance Value: 551 Ohm
Lead Channel Impedance Value: 570 Ohm
Lead Channel Impedance Value: 570 Ohm
Lead Channel Impedance Value: 703 Ohm
Lead Channel Impedance Value: 931 Ohm
Lead Channel Impedance Value: 950 Ohm
Lead Channel Pacing Threshold Amplitude: 0.75 V
Lead Channel Pacing Threshold Amplitude: 0.875 V
Lead Channel Pacing Threshold Amplitude: 3 V
Lead Channel Pacing Threshold Pulse Width: 0.4 ms
Lead Channel Pacing Threshold Pulse Width: 0.4 ms
Lead Channel Pacing Threshold Pulse Width: 0.6 ms
Lead Channel Sensing Intrinsic Amplitude: 12.375 mV
Lead Channel Sensing Intrinsic Amplitude: 12.375 mV
Lead Channel Sensing Intrinsic Amplitude: 2 mV
Lead Channel Sensing Intrinsic Amplitude: 2 mV
Lead Channel Setting Pacing Amplitude: 1.5 V
Lead Channel Setting Pacing Amplitude: 2 V
Lead Channel Setting Pacing Amplitude: 4.5 V
Lead Channel Setting Pacing Pulse Width: 0.4 ms
Lead Channel Setting Pacing Pulse Width: 0.6 ms
Lead Channel Setting Sensing Sensitivity: 1.2 mV

## 2021-05-23 DIAGNOSIS — N189 Chronic kidney disease, unspecified: Secondary | ICD-10-CM | POA: Diagnosis not present

## 2021-05-23 DIAGNOSIS — I129 Hypertensive chronic kidney disease with stage 1 through stage 4 chronic kidney disease, or unspecified chronic kidney disease: Secondary | ICD-10-CM | POA: Diagnosis not present

## 2021-05-23 DIAGNOSIS — N184 Chronic kidney disease, stage 4 (severe): Secondary | ICD-10-CM | POA: Diagnosis not present

## 2021-05-23 DIAGNOSIS — D631 Anemia in chronic kidney disease: Secondary | ICD-10-CM | POA: Diagnosis not present

## 2021-05-23 DIAGNOSIS — E872 Acidosis: Secondary | ICD-10-CM | POA: Diagnosis not present

## 2021-05-23 DIAGNOSIS — N2581 Secondary hyperparathyroidism of renal origin: Secondary | ICD-10-CM | POA: Diagnosis not present

## 2021-05-30 NOTE — Progress Notes (Signed)
Remote pacemaker transmission.   

## 2021-07-25 ENCOUNTER — Other Ambulatory Visit: Payer: Self-pay | Admitting: Interventional Cardiology

## 2021-08-08 ENCOUNTER — Ambulatory Visit (INDEPENDENT_AMBULATORY_CARE_PROVIDER_SITE_OTHER): Payer: Medicare Other

## 2021-08-08 DIAGNOSIS — I428 Other cardiomyopathies: Secondary | ICD-10-CM

## 2021-08-09 LAB — CUP PACEART REMOTE DEVICE CHECK
Battery Remaining Longevity: 53 mo
Battery Voltage: 2.95 V
Brady Statistic AP VP Percent: 8.13 %
Brady Statistic AP VS Percent: 0.19 %
Brady Statistic AS VP Percent: 87.26 %
Brady Statistic AS VS Percent: 4.42 %
Brady Statistic RA Percent Paced: 10.65 %
Brady Statistic RV Percent Paced: 0.28 %
Date Time Interrogation Session: 20221002025537
Implantable Lead Implant Date: 20190717
Implantable Lead Implant Date: 20190717
Implantable Lead Implant Date: 20190717
Implantable Lead Location: 753858
Implantable Lead Location: 753859
Implantable Lead Location: 753860
Implantable Lead Model: 5076
Implantable Lead Model: 5076
Implantable Pulse Generator Implant Date: 20190717
Lead Channel Impedance Value: 1007 Ohm
Lead Channel Impedance Value: 1140 Ohm
Lead Channel Impedance Value: 1140 Ohm
Lead Channel Impedance Value: 1197 Ohm
Lead Channel Impedance Value: 361 Ohm
Lead Channel Impedance Value: 380 Ohm
Lead Channel Impedance Value: 437 Ohm
Lead Channel Impedance Value: 475 Ohm
Lead Channel Impedance Value: 551 Ohm
Lead Channel Impedance Value: 570 Ohm
Lead Channel Impedance Value: 608 Ohm
Lead Channel Impedance Value: 760 Ohm
Lead Channel Impedance Value: 893 Ohm
Lead Channel Impedance Value: 931 Ohm
Lead Channel Pacing Threshold Amplitude: 0.75 V
Lead Channel Pacing Threshold Amplitude: 0.875 V
Lead Channel Pacing Threshold Amplitude: 2 V
Lead Channel Pacing Threshold Pulse Width: 0.4 ms
Lead Channel Pacing Threshold Pulse Width: 0.4 ms
Lead Channel Pacing Threshold Pulse Width: 0.6 ms
Lead Channel Sensing Intrinsic Amplitude: 11.375 mV
Lead Channel Sensing Intrinsic Amplitude: 11.375 mV
Lead Channel Sensing Intrinsic Amplitude: 2.375 mV
Lead Channel Sensing Intrinsic Amplitude: 2.375 mV
Lead Channel Setting Pacing Amplitude: 1.5 V
Lead Channel Setting Pacing Amplitude: 2 V
Lead Channel Setting Pacing Amplitude: 3.75 V
Lead Channel Setting Pacing Pulse Width: 0.4 ms
Lead Channel Setting Pacing Pulse Width: 0.6 ms
Lead Channel Setting Sensing Sensitivity: 1.2 mV

## 2021-08-16 NOTE — Progress Notes (Signed)
Remote pacemaker transmission.   

## 2021-09-06 DIAGNOSIS — N184 Chronic kidney disease, stage 4 (severe): Secondary | ICD-10-CM | POA: Diagnosis not present

## 2021-09-06 DIAGNOSIS — Z950 Presence of cardiac pacemaker: Secondary | ICD-10-CM | POA: Diagnosis not present

## 2021-09-06 DIAGNOSIS — Z23 Encounter for immunization: Secondary | ICD-10-CM | POA: Diagnosis not present

## 2021-09-06 DIAGNOSIS — I700 Atherosclerosis of aorta: Secondary | ICD-10-CM | POA: Diagnosis not present

## 2021-09-06 DIAGNOSIS — I5020 Unspecified systolic (congestive) heart failure: Secondary | ICD-10-CM | POA: Diagnosis not present

## 2021-09-14 DIAGNOSIS — N184 Chronic kidney disease, stage 4 (severe): Secondary | ICD-10-CM | POA: Diagnosis not present

## 2021-09-20 DIAGNOSIS — D631 Anemia in chronic kidney disease: Secondary | ICD-10-CM | POA: Diagnosis not present

## 2021-09-20 DIAGNOSIS — N2581 Secondary hyperparathyroidism of renal origin: Secondary | ICD-10-CM | POA: Diagnosis not present

## 2021-09-20 DIAGNOSIS — E872 Acidosis, unspecified: Secondary | ICD-10-CM | POA: Diagnosis not present

## 2021-09-20 DIAGNOSIS — I120 Hypertensive chronic kidney disease with stage 5 chronic kidney disease or end stage renal disease: Secondary | ICD-10-CM | POA: Diagnosis not present

## 2021-09-20 DIAGNOSIS — N185 Chronic kidney disease, stage 5: Secondary | ICD-10-CM | POA: Diagnosis not present

## 2021-09-30 ENCOUNTER — Other Ambulatory Visit: Payer: Self-pay | Admitting: Cardiology

## 2021-10-02 ENCOUNTER — Other Ambulatory Visit: Payer: Self-pay | Admitting: *Deleted

## 2021-10-02 MED ORDER — METOPROLOL SUCCINATE ER 50 MG PO TB24: 50.0000 mg | ORAL_TABLET | Freq: Every day | ORAL | 3 refills | Status: DC

## 2021-10-03 ENCOUNTER — Other Ambulatory Visit: Payer: Self-pay | Admitting: *Deleted

## 2021-10-03 MED ORDER — ISOSORBIDE DINITRATE 20 MG PO TABS: 20.0000 mg | ORAL_TABLET | Freq: Three times a day (TID) | ORAL | 1 refills | Status: DC

## 2021-10-05 DIAGNOSIS — H2513 Age-related nuclear cataract, bilateral: Secondary | ICD-10-CM | POA: Diagnosis not present

## 2021-10-05 DIAGNOSIS — H524 Presbyopia: Secondary | ICD-10-CM | POA: Diagnosis not present

## 2021-10-18 DIAGNOSIS — H25013 Cortical age-related cataract, bilateral: Secondary | ICD-10-CM | POA: Diagnosis not present

## 2021-10-18 DIAGNOSIS — H2513 Age-related nuclear cataract, bilateral: Secondary | ICD-10-CM | POA: Diagnosis not present

## 2021-10-26 ENCOUNTER — Other Ambulatory Visit: Payer: Self-pay

## 2021-10-26 NOTE — Patient Outreach (Signed)
[facility]) Care Management  10/26/2021  Betty [name] 04/05/1946 [id number]   MD referral received from [facility] Internal Medicine @ [place] for LCSW as son requesting help and resources for mother who has dementia. Assigned to [employer], LCSW for follow up.   [place] Management Assistant

## 2021-11-01 ENCOUNTER — Other Ambulatory Visit: Payer: Self-pay

## 2021-11-01 DIAGNOSIS — N184 Chronic kidney disease, stage 4 (severe): Secondary | ICD-10-CM

## 2021-11-05 LAB — CUP PACEART REMOTE DEVICE CHECK
Battery Remaining Longevity: 44 mo
Battery Voltage: 2.95 V
Brady Statistic AP VP Percent: 13.19 %
Brady Statistic AP VS Percent: 0.3 %
Brady Statistic AS VP Percent: 82.29 %
Brady Statistic AS VS Percent: 4.21 %
Brady Statistic RA Percent Paced: 15.85 %
Brady Statistic RV Percent Paced: 0.2 %
Date Time Interrogation Session: 20221231194147
Implantable Lead Implant Date: 20190717
Implantable Lead Implant Date: 20190717
Implantable Lead Implant Date: 20190717
Implantable Lead Location: 753858
Implantable Lead Location: 753859
Implantable Lead Location: 753860
Implantable Lead Model: 5076
Implantable Lead Model: 5076
Implantable Pulse Generator Implant Date: 20190717
Lead Channel Impedance Value: 1007 Ohm
Lead Channel Impedance Value: 1064 Ohm
Lead Channel Impedance Value: 304 Ohm
Lead Channel Impedance Value: 361 Ohm
Lead Channel Impedance Value: 380 Ohm
Lead Channel Impedance Value: 456 Ohm
Lead Channel Impedance Value: 513 Ohm
Lead Channel Impedance Value: 532 Ohm
Lead Channel Impedance Value: 570 Ohm
Lead Channel Impedance Value: 608 Ohm
Lead Channel Impedance Value: 893 Ohm
Lead Channel Impedance Value: 912 Ohm
Lead Channel Impedance Value: 988 Ohm
Lead Channel Impedance Value: 988 Ohm
Lead Channel Pacing Threshold Amplitude: 0.75 V
Lead Channel Pacing Threshold Amplitude: 0.75 V
Lead Channel Pacing Threshold Amplitude: 3.25 V
Lead Channel Pacing Threshold Pulse Width: 0.4 ms
Lead Channel Pacing Threshold Pulse Width: 0.4 ms
Lead Channel Pacing Threshold Pulse Width: 0.6 ms
Lead Channel Sensing Intrinsic Amplitude: 2 mV
Lead Channel Sensing Intrinsic Amplitude: 2 mV
Lead Channel Sensing Intrinsic Amplitude: 8.75 mV
Lead Channel Sensing Intrinsic Amplitude: 8.75 mV
Lead Channel Setting Pacing Amplitude: 1.5 V
Lead Channel Setting Pacing Amplitude: 2 V
Lead Channel Setting Pacing Amplitude: 4.25 V
Lead Channel Setting Pacing Pulse Width: 0.4 ms
Lead Channel Setting Pacing Pulse Width: 0.6 ms
Lead Channel Setting Sensing Sensitivity: 1.2 mV

## 2021-11-07 ENCOUNTER — Ambulatory Visit (INDEPENDENT_AMBULATORY_CARE_PROVIDER_SITE_OTHER): Payer: Medicare Other

## 2021-11-07 DIAGNOSIS — I428 Other cardiomyopathies: Secondary | ICD-10-CM | POA: Diagnosis not present

## 2021-11-09 DIAGNOSIS — N185 Chronic kidney disease, stage 5: Secondary | ICD-10-CM | POA: Diagnosis not present

## 2021-11-14 ENCOUNTER — Encounter: Payer: Self-pay | Admitting: Vascular Surgery

## 2021-11-14 ENCOUNTER — Ambulatory Visit (HOSPITAL_COMMUNITY)
Admission: RE | Admit: 2021-11-14 | Discharge: 2021-11-14 | Disposition: A | Discharge status: Home / Self Care | Payer: Medicare Other | Source: Ambulatory Visit | Attending: Vascular Surgery | Admitting: Vascular Surgery

## 2021-11-14 ENCOUNTER — Ambulatory Visit (INDEPENDENT_AMBULATORY_CARE_PROVIDER_SITE_OTHER)
Admission: RE | Admit: 2021-11-14 | Discharge: 2021-11-14 | Disposition: A | Discharge status: Home / Self Care | Payer: Medicare Other | Source: Ambulatory Visit | Attending: Vascular Surgery | Admitting: Vascular Surgery

## 2021-11-14 ENCOUNTER — Other Ambulatory Visit: Payer: Self-pay

## 2021-11-14 ENCOUNTER — Ambulatory Visit (INDEPENDENT_AMBULATORY_CARE_PROVIDER_SITE_OTHER): Payer: Medicare Other | Admitting: Vascular Surgery

## 2021-11-14 DIAGNOSIS — N184 Chronic kidney disease, stage 4 (severe): Secondary | ICD-10-CM

## 2021-11-14 DIAGNOSIS — N185 Chronic kidney disease, stage 5: Secondary | ICD-10-CM | POA: Diagnosis not present

## 2021-11-14 NOTE — Progress Notes (Signed)
Patient name: Betty [name] MRN: [id number] DOB: May 11, 1946 Sex: female  REASON FOR CONSULT: Evaluate for permanent hemodialysis access  HPI: Betty [name] is a 76 y.o. female, with history of hypertension, CHF, hyperlipidemia, stage V CKD that presents for evaluation of permanent hemodialysis access.  Patient states she has not started dialysis at this time.  She is right-handed.  She has never had any access in the past.  She does have a left chest wall pacemaker implant.  The referral from nephrology states placed AV fistula now and wait on AV graft.  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 ([place])    Acute mastitis of right breast 10/2003   Arthritis    bursitis in right shoulder (05/21/2018)   Cerebral aneurysm, nonruptured 07/16/2018   Right anterior communicating artery   CKD (chronic kidney disease), stage IV (HCC)    Colon polyps    COPD (chronic obstructive pulmonary disease) (HCC)    emphysema   Dementia ([place]) 07/16/2018   Family history of breast cancer    GERD (gastroesophageal reflux disease)    Hypercholesterolemia    Hypertension    Non-small cell carcinoma of right lung, stage 1 ([place]) 05/17/2015   Stage IB, right upper lobectomy 04/29/15, chemo    NSTEMI (non-ST elevated myocardial infarction) ([place]) 03/24/2018   Presence of permanent cardiac pacemaker 05/21/2018   Pure hypercholesterolemia    Sciatica of right side    Seasonal allergic rhinitis    Tobacco dependence    Tubular adenoma of colon    Vitamin D deficiency     Past Surgical History:  Procedure Laterality Date   BIOPSY  06/26/2019   Procedure: BIOPSY;  Surgeon: Wilford Corner, MD;  Location: [place];  Service: Endoscopy;;   BIV PACEMAKER INSERTION CRT-P N/A 05/21/2018   Procedure: BIV PACEMAKER INSERTION CRT-P;  Surgeon: Deboraha Sprang, MD;  Location: [place] CV LAB;  Service: Cardiovascular;  Laterality: N/A;   BREAST SURGERY      small mass removed from left breast--benign   CRYO INTERCOSTAL NERVE BLOCK Right 04/29/2015   Procedure: CRYO INTERCOSTAL NERVE BLOCK;  Surgeon: [name], MD;  Location: [place];  Service: Thoracic;  Laterality: Right;   ESOPHAGOGASTRODUODENOSCOPY (EGD) WITH PROPOFOL N/A 06/26/2019   Procedure: ESOPHAGOGASTRODUODENOSCOPY (EGD) WITH PROPOFOL;  Surgeon: Wilford Corner, MD;  Location: [place];  Service: Endoscopy;  Laterality: N/A;   INSERT / REPLACE / REMOVE PACEMAKER  05/21/2018   LOBECTOMY Right 04/29/2015   Procedure: RIGHT LOWER LUNG LOBECTOMY ;  Surgeon: [name], MD;  Location: [place];  Service: Thoracic;  Laterality: Right;   LYMPH NODE DISSECTION Right 04/29/2015   Procedure: RIGHT LUNG LYMPH NODE DISSECTION;  Surgeon: [name], MD;  Location: [place];  Service: Thoracic;  Laterality: Right;   RIGHT/LEFT HEART CATH AND CORONARY ANGIOGRAPHY N/A 04/24/2018   Procedure: RIGHT/LEFT HEART CATH AND CORONARY ANGIOGRAPHY;  Surgeon: [name], MD;  Location: [place] CV LAB;  Service: Cardiovascular;  Laterality: N/A;   VAGINAL DELIVERY     51 yrs ago   VIDEO ASSISTED THORACOSCOPY (VATS)/ LOBECTOMY Right 04/29/2015   Procedure: RIGHT VIDEO ASSISTED THORACOSCOPY (VATS) WEDGE RESECTION/ RIGHT LOWER LOBECTOMY, CRYO-ANALGESIA OF INTERCOSTAL NERVES;  Surgeon: [name], MD;  Location: [place];  Service: Thoracic;  Laterality: Right;    Family History  Problem Relation Age of Onset   Cancer Father    Cancer Sister  Heart attack Brother    Dementia Brother    Other Son        bicuspid aortic valve   Heart attack Brother    Cancer Sister     SOCIAL HISTORY: Social History   Socioeconomic History   Marital status: Single    Spouse name: Not on file   Number of children: 1   Years of education: 13   Highest education level: Not on file  Occupational History   Occupation: Retired  Tobacco Use   Smoking status: Former    Packs/day:  0.75    Years: 47.00    Pack years: 35.25    Types: Cigarettes    Quit date: 04/29/2015    Years since quitting: 6.5   Smokeless tobacco: Never  Vaping Use   Vaping Use: Never used  Substance and Sexual Activity   Alcohol use: Yes    Comment: 05/21/2018 "maybe 6 drinks/year"   Drug use: Never   Sexual activity: Not on file  Other Topics Concern   Not on file  Social History Narrative   Lives alone   Caffeine use: 1 cup coffee per day   Right handed    Social Determinants of Health   Financial Resource Strain: Not on file  Food Insecurity: Not on file  Transportation Needs: Not on file  Physical Activity: Not on file  Stress: Not on file  Social Connections: Not on file  Intimate Partner Violence: Not on file    Allergies  Allergen Reactions   Sulfa Antibiotics Itching    Current Outpatient Medications  Medication Sig Dispense Refill   acetaminophen (TYLENOL) 325 MG tablet Take 650 mg by mouth every 6 (six) hours as needed for mild pain or moderate pain.     aspirin EC 81 MG tablet Take 81 mg by mouth at bedtime.     atorvastatin (LIPITOR) 40 MG tablet TAKE 1 TABLET BY MOUTH EVERY DAY AT 6 PM 90 tablet 1   Cholecalciferol (VITAMIN D) 2000 units tablet Take 1,000 Units by mouth at bedtime.      donepezil (ARICEPT) 10 MG tablet Take 1 tablet (10 mg total) by mouth at bedtime. 90 tablet 3   febuxostat (ULORIC) 40 MG tablet Take 40 mg by mouth at bedtime.      hydrALAZINE (APRESOLINE) 50 MG tablet Take 1 tablet (50 mg total) by mouth 3 (three) times daily. 270 tablet 3   isosorbide dinitrate (ISORDIL) 20 MG tablet Take 1 tablet (20 mg total) by mouth 3 (three) times daily. 270 tablet 1   memantine (NAMENDA) 10 MG tablet Take 1 tablet (10 mg total) by mouth 2 (two) times daily. 180 tablet 3   metoprolol succinate (TOPROL-XL) 50 MG 24 hr tablet Take 1 tablet (50 mg total) by mouth daily. Take with or immediately following a meal. 90 tablet 3   Multiple Vitamin (MULTIVITAMIN  WITH MINERALS) TABS tablet Take 0.5 tablets by mouth at bedtime.     nitroGLYCERIN (NITROSTAT) 0.4 MG SL tablet Place 1 tablet (0.4 mg total) under the tongue every 5 (five) minutes x 3 doses as needed for chest pain. 25 tablet 12   omeprazole (PRILOSEC) 40 MG capsule Take 40 mg by mouth daily.     sodium bicarbonate 650 MG tablet Take 1 tablet (650 mg total) by mouth 2 (two) times daily. 30 tablet 0   No current facility-administered medications for this visit.    REVIEW OF SYSTEMS:  [X]  denotes positive finding, [ ]  denotes  negative finding Cardiac  Comments:  Chest pain or chest pressure:    Shortness of breath upon exertion:    Short of breath when lying flat:    Irregular heart rhythm:        Vascular    Pain in calf, thigh, or hip brought on by ambulation:    Pain in feet at night that wakes you up from your sleep:     Blood clot in your veins:    Leg swelling:         Pulmonary    Oxygen at home:    Productive cough:     Wheezing:         Neurologic    Sudden weakness in arms or legs:     Sudden numbness in arms or legs:     Sudden onset of difficulty speaking or slurred speech:    Temporary loss of vision in one eye:     Problems with dizziness:         Gastrointestinal    Blood in stool:     Vomited blood:         Genitourinary    Burning when urinating:     Blood in urine:        Psychiatric    Major depression:         Hematologic    Bleeding problems:    Problems with blood clotting too easily:        Skin    Rashes or ulcers:        Constitutional    Fever or chills:      PHYSICAL EXAM: Vitals:   11/14/21 1329  BP: (!) 176/79  Pulse: 80  Resp: 16  Temp: 97.6 F (36.4 C)  TempSrc: Temporal  SpO2: 94%  Weight: 122 lb (55.3 kg)  Height: 5\' 4"  (1.626 m)    GENERAL: The patient is a well-nourished female, in no acute distress. The vital signs are documented above. CARDIAC: There is a regular rate and rhythm.  VASCULAR:  Bilateral radial  and brachial pulses palpable Left chest wall pacemaker PULMONARY: No respiratory distress. ABDOMEN: Soft and non-tender.  MUSCULOSKELETAL: There are no major deformities or cyanosis. NEUROLOGIC: No focal weakness or paresthesias are detected. SKIN: There are no ulcers or rashes noted. PSYCHIATRIC: The patient has a normal affect.  DATA:   Bilateral upper extremity vein mapping shows small surface veins in both upper arms  Bilateral upper extremity arterial duplex shows triphasic waveforms except biphasic left ulnar  Assessment/Plan:  76 year old female with multiple comorbidities including stage V CKD that presents for permanent hemodialysis access evaluation.  I discussed that after review of her vein mapping she does not appear to have any usable surface vein in either upper extremity.  The referral from [facility] states place AV fistula now and wait on AV graft.  I discussed with her and her son that ultimately it appears she would need an AV graft and we will delay this until her nephrologist notifies our office that it is appropriate to proceed with AV graft placement.  I do favor placement in the right arm given a left chest wall pacemaker with risk for central stenosis versus occlusion.  I discussed risks and benefits of surgery including risk of bleeding, infection, steal syndrome, failure to mature.  I gave her son our contact information and discussed either him or her nephrologist Dr. Carolin Sicks let us know when it is appropriate to proceed with access placement.  Marty Heck, MD [facility] Office: [phone]

## 2021-11-16 DIAGNOSIS — N185 Chronic kidney disease, stage 5: Secondary | ICD-10-CM | POA: Diagnosis not present

## 2021-11-16 DIAGNOSIS — N2581 Secondary hyperparathyroidism of renal origin: Secondary | ICD-10-CM | POA: Diagnosis not present

## 2021-11-16 DIAGNOSIS — E872 Acidosis, unspecified: Secondary | ICD-10-CM | POA: Diagnosis not present

## 2021-11-16 DIAGNOSIS — I120 Hypertensive chronic kidney disease with stage 5 chronic kidney disease or end stage renal disease: Secondary | ICD-10-CM | POA: Diagnosis not present

## 2021-11-16 DIAGNOSIS — D631 Anemia in chronic kidney disease: Secondary | ICD-10-CM | POA: Diagnosis not present

## 2021-11-17 NOTE — Progress Notes (Signed)
Remote pacemaker transmission.   

## 2021-11-21 DIAGNOSIS — H25012 Cortical age-related cataract, left eye: Secondary | ICD-10-CM | POA: Diagnosis not present

## 2021-11-21 DIAGNOSIS — H21562 Pupillary abnormality, left eye: Secondary | ICD-10-CM | POA: Diagnosis not present

## 2021-11-21 DIAGNOSIS — H2512 Age-related nuclear cataract, left eye: Secondary | ICD-10-CM | POA: Diagnosis not present

## 2021-11-21 DIAGNOSIS — H25812 Combined forms of age-related cataract, left eye: Secondary | ICD-10-CM | POA: Diagnosis not present

## 2021-11-28 ENCOUNTER — Encounter: Payer: Self-pay | Admitting: *Deleted

## 2021-11-28 ENCOUNTER — Other Ambulatory Visit: Payer: Self-pay | Admitting: *Deleted

## 2021-11-28 NOTE — Patient Outreach (Signed)
Care Management Clinical Social Work Note  11/28/2021 Name: RUNA WHITTINGHAM MRN: [id number] DOB: [birth year]  MELITA VILLALONA is a 76 y.o. year old female who is a primary care patient of Josetta Huddle, MD.  The Care Management team was consulted for assistance with chronic disease management and coordination needs.  Engaged with patient's son by telephone for initial visit in response to provider referral for social work chronic care management and care coordination services  Consent to Services:  Ms. Krygier was given information about Care Management services today including:  Care Management services includes personalized support from designated clinical staff supervised by her physician, including individualized plan of care and coordination with other care providers 24/7 contact phone numbers for assistance for urgent and routine care needs. The patient may stop case management services at any time by phone call to the office staff.  Patient agreed to services and consent obtained.   Assessment: Review of patient past medical history, allergies, medications, and health status, including review of relevant consultants reports was performed today as part of a comprehensive evaluation and provision of chronic care management and care coordination services.  SDOH (Social Determinants of Health) assessments and interventions performed:  SDOH Interventions    Flowsheet Row Most Recent Value  SDOH Interventions   Food Insecurity Interventions Intervention Not Indicated, Other (Comment)  [Verified by Son Chrissie Noa Pha]  Financial Strain Interventions Intervention Not Indicated, Other (Comment)  [Verified by Prudencio Burly Mault]  Housing Interventions Intervention Not Indicated, Other (Comment)  [Verified by Prudencio Burly Previti]  Intimate Partner Violence Interventions Intervention Not Indicated, Other (Comment)  [Verified by Prudencio Burly Hitch]  Physical Activity Interventions Intervention Not Indicated,  Other (Comments)  [Verified by Prudencio Burly Gest]  Stress Interventions Intervention Not Indicated, Other (Comment), [place], Provide Counseling  [Verified by Prudencio Burly Romanoff]  Social Connections Interventions Intervention Not Indicated, Other (Comment)  [Verified by Prudencio Burly Buikema]  Transportation Interventions Intervention Not Indicated, Other (Comment)  [Verified by Prudencio Burly Villarin]        Advanced Directives Status:  Living Will and [place] Already in [place].  Care Plan  Allergies  Allergen Reactions   Sulfa Antibiotics Itching    Outpatient Encounter Medications as of 11/28/2021  Medication Sig   acetaminophen (TYLENOL) 325 MG tablet Take 650 mg by mouth every 6 (six) hours as needed for mild pain or moderate pain.   aspirin EC 81 MG tablet Take 81 mg by mouth at bedtime.   atorvastatin (LIPITOR) 40 MG tablet TAKE 1 TABLET BY MOUTH EVERY DAY AT 6 PM   Cholecalciferol (VITAMIN D) 2000 units tablet Take 1,000 Units by mouth at bedtime.    donepezil (ARICEPT) 10 MG tablet Take 1 tablet (10 mg total) by mouth at bedtime.   febuxostat (ULORIC) 40 MG tablet Take 40 mg by mouth at bedtime.    hydrALAZINE (APRESOLINE) 50 MG tablet Take 1 tablet (50 mg total) by mouth 3 (three) times daily.   isosorbide dinitrate (ISORDIL) 20 MG tablet Take 1 tablet (20 mg total) by mouth 3 (three) times daily.   memantine (NAMENDA) 10 MG tablet Take 1 tablet (10 mg total) by mouth 2 (two) times daily.   metoprolol succinate (TOPROL-XL) 50 MG 24 hr tablet Take 1 tablet (50 mg total) by mouth daily. Take with or immediately following a meal.   Multiple Vitamin (MULTIVITAMIN WITH MINERALS) TABS tablet Take 0.5  tablets by mouth at bedtime.   nitroGLYCERIN (NITROSTAT) 0.4 MG SL tablet Place 1 tablet (0.4 mg total) under the tongue every 5 (five) minutes x 3 doses as needed for chest pain.   omeprazole (PRILOSEC) 40 MG capsule Take 40 mg by  mouth daily.   sodium bicarbonate 650 MG tablet Take 1 tablet (650 mg total) by mouth 2 (two) times daily.   No facility-administered encounter medications on file as of 11/28/2021.    Patient Active Problem List   Diagnosis Date Noted   CKD (chronic kidney disease) stage 5, GFR less than 15 ml/min (HCC) 11/14/2021   GI bleed 06/24/2019   Hypotension 06/24/2019   Electrolyte abnormality 06/24/2019   Pressure injury of skin 05/29/2019   Altered mental status    Acute respiratory failure with hypoxemia ([place])    COVID-19 05/14/2019   Essential hypertension 05/14/2019   Dementia ([place]) 07/16/2018   Cerebral aneurysm, nonruptured 07/16/2018   Nonischemic cardiomyopathy ([place]) 05/21/2018   Congestive heart failure, NYHA class 3, chronic, systolic (HCC) 86/76/7209   Chronic systolic heart failure (HCC)    Chest pain 04/22/2018   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   NSTEMI (non-ST elevated myocardial infarction) ([place]) 03/24/2018   Pure hypercholesterolemia    CKD (chronic kidney disease), stage IV (HCC)    Non-small cell carcinoma of right lung, stage 1 ([place]) 05/17/2015   S/P lobectomy of lung 04/29/2015    Conditions to be addressed/monitored: HTN and Dementia.  Level of Care Concerns, ADL/IADL Limitations, Limited Access to Caregiver, Cognitive Deficits, Memory Deficits, and Lacks Knowledge of [employer].  Care Plan : LCSW Plan of Care  Updates made by Francis Gaines, LCSW since 11/28/2021 12:00 AM     Problem: Keeping Myself Safe in the Home.   Priority: High     Goal: Keeping Myself Safe in the Home.   Start Date: 11/28/2021  Expected End Date: 02/26/2022  This Visit's Progress: On track  Priority: High  Note:   Current Barriers:   Patient with Cognitive and Neurobehavioral Dysfunction, Altered Mental Status, Stage V Chronic Kidney Disease, Dementia, Stage III Chronic Congestive Heart Failure, and Acute Respiratory Failure with Hypoxemia needs  Support, Education, Referrals, Resources, Advocacy, and Care Coordination to resolve unmet personal care needs in the home. Patient lives alone and is unable to consistently perform ADL's/IADL's independently. Patient is refusing to pursue placement, of any kind, at this time. Lacks knowledge of available community agencies and resources. Clinical Goals:  Patient will have in-home care services in place, either through Rosealee Albee, Education officer, museum II with the [facility], or through a private agency of choice. Patient's son will work with LCSW and Rosealee Albee, Social Worker II with the [facility], to coordinate care for in-home aide services. Patient's son will receive, review and consider arranging in-home care services through a private agency of choice, from the list provided.     Patient will attend all scheduled medical appointments, as evidenced by patient report and care team review of appointment completion in electronic medical record. Patient will demonstrate improved health management independence as evidenced by having in-home care services in place. Clinical Interventions: Patient's son interviewed and appropriate assessments performed. Collaboration with Primary Care Physician, Dr. Josetta Huddle regarding development and update of comprehensive plan of care as evidenced by provider attestation and co-signature. Inter-disciplinary care team collaboration (see longitudinal plan of care). Interventions  performed:  Problem Solving/Task Centered, Quality of Sleep Assessed and Sleep Hygiene Techniques Promoted, Caregiver Stress Acknowledged, and Consideration of In-Home Care Services Encouraged. Referral placed to Rosealee Albee, Social Worker II with the [facility]. Discussed plans with  patient's son for ongoing care management follow-up and provided direct contact information for care management team. Assisted patient's son with obtaining information about health plan benefits through [employer]. Provided education to patient's son regarding level of care options. Assessed needs, level of care concerns, basic eligibility and provided education on [employer] process. LCSW collaboration with Rosealee Albee, Social Worker II with the [facility], to verify receipt of application. Identified resources and durable medical equipment needed in the home to improve safety and promote independence. Patient Goals/Self-Care Activities:  Son agreed to work with [employer] on a bi-weekly basis, until approved for [employer], or until in-home care services are in place through a private agency of choice. Son will follow-up with Rosealee Albee, Social Worker II with the [facility] [phone]) or # [phone]), to check the status of your application. Son will review the following list of in-home care agencies and resources, and will be prepared to discuss with LCSW during our next scheduled telephone outreach call:             ~ [place]             ~ [place]             ~ [facility]             ~ Adult [place] Son will contact LCSW directly (# [phone]) if he has questions, needs assistance, or if additional social work needs are identified. Follow-Up Plan:  Telephone outreach call with son on 12/07/2021 at 9:00 am.   Nat Christen, BSW, MSW, [place]  Licensed Clinical Social Worker  [place]  Mailing [place] [address] [place], [place] [address] Physical Address-[address] [place], [place]  [address] Toll Free Main # [phone] Fax # [phone] Cell # [phone]  [email or web address]

## 2021-12-06 ENCOUNTER — Encounter: Payer: Self-pay | Admitting: Neurology

## 2021-12-06 ENCOUNTER — Ambulatory Visit: Payer: Medicare Other | Admitting: Neurology

## 2021-12-06 NOTE — Progress Notes (Deleted)
PATIENT: Betty [name] DOB: 1946-01-06  REASON FOR VISIT: follow up for memory  HISTORY FROM: patient  HISTORY OF PRESENT ILLNESS: Today 12/06/21   Update 04/05/2021 SS: Betty [name] this 76 year old female here today for follow-up for progressive memory disturbance, right anterior communicating artery aneurysm.  Has renal insufficiency with a pacemaker.  On Aricept and Namenda for memory. In the ER 03/30/21 for confusion, CT head stable. She doesn't remember this, her son called her at 3:30 she was at home, he went by her house at 4:30 she wasn't there, he couldn't find her, called police. Not sure how she got to [facility], but she was looking for her son, ended up in a shoe store, staff noticed she was confused called EMS. Now has outdoor cameras. Her son is checking into adult day care. Her is checking into getting POA. Health overall has been good. She doesn't drive. Today MMSE 17/30. Has been given something PRN for anxiety, not sure name. Sleeping well. Her housework has slipped, son does laundry, he brings meals.  Update 10/04/2020 SS: Betty [name] is a 76 year old female with history of renal insufficiency and progressive memory disturbance.  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  She has a pacemaker.  Is on Aricept and Namenda.  Memory remains overall stable.  She continues to live alone, her son calls her every day, she takes her medications while they are on the phone.  Continues to do her own ADLs, housework.  Her son provides meals.  She is sleeping well, good appetite, weight remains stable.  Enjoys watching TV.  She does not drive, does not get out much, is cautious with Covid.  Overall health has been good.  Saw nephrology yesterday, everything was stable. Was going to have CTA in July with Dr. Estanislado Pandy, but was unable due to elevated creatinine, no scan was done.  This was for evaluation of aneurysm.  No falls, dizziness, or  weakness.  Presents today for evaluation accompanied by her son, Betty [name].  HISTORY 03/29/2020 SS: Betty [name] is a 76 year old female with history of renal insufficiency and progressive memory problem.  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  She has a pacemaker.  For her memory, she is on Aricept and Namenda.  Feels memory is overall stable.  She lives alone, her son check daily, he does the cooking.  He manages her medications.  She does her own ADLs, and some light housework.  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  She walks daily, half a mile or more with her son.  She sleeps well.  No recent falls.  Indicates, they have been unable to reach Dr. Estanislado Pandy, review of chart indicates MRA was scheduled back in December, but appointment was no-show.  Her overall health is good.  Denies any new problems or concerns.  Presents today for follow-up accompanied by her son.   REVIEW OF SYSTEMS: Out of a complete 14 system review of symptoms, the patient complains only of the following symptoms, and all other reviewed systems are negative.  See HPI  ALLERGIES: Allergies  Allergen Reactions   Sulfa Antibiotics Itching    HOME MEDICATIONS: Outpatient Medications Prior to Visit  Medication Sig Dispense Refill   acetaminophen (TYLENOL) 325 MG tablet Take 650 mg by mouth every 6 (six) hours as needed  for mild pain or moderate pain.     aspirin EC 81 MG tablet Take 81 mg by mouth at bedtime.     atorvastatin (LIPITOR) 40 MG tablet TAKE 1 TABLET BY MOUTH EVERY DAY AT 6 PM 90 tablet 1   Cholecalciferol (VITAMIN D) 2000 units tablet Take 1,000 Units by mouth at bedtime.      donepezil (ARICEPT) 10 MG tablet Take 1 tablet (10 mg total) by mouth at bedtime. 90 tablet 3   febuxostat (ULORIC) 40 MG tablet Take 40 mg by mouth at bedtime.      hydrALAZINE (APRESOLINE) 50 MG tablet Take 1 tablet (50 mg total) by  mouth 3 (three) times daily. 270 tablet 3   isosorbide dinitrate (ISORDIL) 20 MG tablet Take 1 tablet (20 mg total) by mouth 3 (three) times daily. 270 tablet 1   memantine (NAMENDA) 10 MG tablet Take 1 tablet (10 mg total) by mouth 2 (two) times daily. 180 tablet 3   metoprolol succinate (TOPROL-XL) 50 MG 24 hr tablet Take 1 tablet (50 mg total) by mouth daily. Take with or immediately following a meal. 90 tablet 3   Multiple Vitamin (MULTIVITAMIN WITH MINERALS) TABS tablet Take 0.5 tablets by mouth at bedtime.     nitroGLYCERIN (NITROSTAT) 0.4 MG SL tablet Place 1 tablet (0.4 mg total) under the tongue every 5 (five) minutes x 3 doses as needed for chest pain. 25 tablet 12   omeprazole (PRILOSEC) 40 MG capsule Take 40 mg by mouth daily.     sodium bicarbonate 650 MG tablet Take 1 tablet (650 mg total) by mouth 2 (two) times daily. 30 tablet 0   No facility-administered medications prior to visit.    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 ([place])    Acute mastitis of right breast 10/2003   Arthritis    bursitis in right shoulder (05/21/2018)   Cerebral aneurysm, nonruptured 07/16/2018   Right anterior communicating artery   CKD (chronic kidney disease), stage IV (HCC)    Colon polyps    COPD (chronic obstructive pulmonary disease) (HCC)    emphysema   Dementia ([place]) 07/16/2018   Family history of breast cancer    GERD (gastroesophageal reflux disease)    Hypercholesterolemia    Hypertension    Non-small cell carcinoma of right lung, stage 1 ([place]) 05/17/2015   Stage IB, right upper lobectomy 04/29/15, chemo    NSTEMI (non-ST elevated myocardial infarction) ([place]) 03/24/2018   Presence of permanent cardiac pacemaker 05/21/2018   Pure hypercholesterolemia    Sciatica of right side    Seasonal allergic rhinitis    Tobacco dependence    Tubular adenoma of colon    Vitamin D deficiency     PAST SURGICAL  HISTORY: Past Surgical History:  Procedure Laterality Date   BIOPSY  06/26/2019   Procedure: BIOPSY;  Surgeon: Wilford Corner, MD;  Location: [place];  Service: Endoscopy;;   BIV PACEMAKER INSERTION CRT-P N/A 05/21/2018   Procedure: BIV PACEMAKER INSERTION CRT-P;  Surgeon: Deboraha Sprang, MD;  Location: [place] [facility];  Service: Cardiovascular;  Laterality: N/A;   BREAST SURGERY     small mass removed from left breast--benign   CRYO INTERCOSTAL NERVE BLOCK Right 04/29/2015   Procedure: CRYO INTERCOSTAL NERVE BLOCK;  Surgeon: [name], MD;  Location: [place];  Service: Thoracic;  Laterality: Right;   ESOPHAGOGASTRODUODENOSCOPY (EGD) WITH PROPOFOL N/A 06/26/2019   Procedure:  ESOPHAGOGASTRODUODENOSCOPY (EGD) WITH PROPOFOL;  Surgeon: Wilford Corner, MD;  Location: [place];  Service: Endoscopy;  Laterality: N/A;   INSERT / REPLACE / REMOVE PACEMAKER  05/21/2018   LOBECTOMY Right 04/29/2015   Procedure: RIGHT LOWER LUNG LOBECTOMY ;  Surgeon: [name], MD;  Location: [place];  Service: Thoracic;  Laterality: Right;   LYMPH NODE DISSECTION Right 04/29/2015   Procedure: RIGHT LUNG LYMPH NODE DISSECTION;  Surgeon: [name], MD;  Location: [place];  Service: Thoracic;  Laterality: Right;   RIGHT/LEFT HEART CATH AND CORONARY ANGIOGRAPHY N/A 04/24/2018   Procedure: RIGHT/LEFT HEART CATH AND CORONARY ANGIOGRAPHY;  Surgeon: [name], MD;  Location: [place] [facility];  Service: Cardiovascular;  Laterality: N/A;   VAGINAL DELIVERY     51 yrs ago   VIDEO ASSISTED THORACOSCOPY (VATS)/ LOBECTOMY Right 04/29/2015   Procedure: RIGHT VIDEO ASSISTED THORACOSCOPY (VATS) WEDGE RESECTION/ RIGHT LOWER LOBECTOMY, CRYO-ANALGESIA OF INTERCOSTAL NERVES;  Surgeon: [name], MD;  Location: [facility];  Service: Thoracic;  Laterality: Right;    FAMILY HISTORY: Family History  Problem Relation Age of Onset   Cancer Father    Cancer Sister    Heart attack Brother     Dementia Brother    Other Son        bicuspid aortic valve   Heart attack Brother    Cancer Sister     SOCIAL HISTORY: Social History   Socioeconomic History   Marital status: Single    Spouse name: Not on file   Number of children: 1   Years of education: 13   Highest education level: High school graduate  Occupational History   Occupation: Retired  Tobacco Use   Smoking status: Former    Packs/day: 0.75    Years: 47.00    Pack years: 35.25    Types: Cigarettes    Quit date: 04/29/2015    Years since quitting: 6.6    Passive exposure: Past   Smokeless tobacco: Never  Vaping Use   Vaping Use: Never used  Substance and Sexual Activity   Alcohol use: Yes    Comment: 05/21/2018 "maybe 6 drinks/year"   Drug use: Never   Sexual activity: Not on file  Other Topics Concern   Not on file  Social History Narrative   Lives alone   Caffeine use: 1 cup coffee per day   Right handed    Social Determinants of Health   Financial Resource Strain: Low Risk    Difficulty of Paying Living Expenses: Not hard at all  Food Insecurity: No Food Insecurity   Worried About Charity fundraiser in the Last Year: Never true   [place] in the Last Year: Never true  Transportation Needs: No Transportation Needs   Lack of Transportation (Medical): No   Lack of Transportation (Non-Medical): No  Physical Activity: Inactive   Days of Exercise per Week: 0 days   Minutes of Exercise per Session: 0 min  Stress: No Stress Concern Present   Feeling of Stress : Not at all  Social Connections: Unknown   Frequency of Communication with Friends and Family: More than three times a week   Frequency of Social Gatherings with Friends and Family: More than three times a week   Attends Religious Services: 1 to 4 times per year   Active Member of [employer] or Organizations: No   Attends Archivist Meetings: Never   Marital Status: Not on file  Intimate Partner  Violence: Not At Risk    Fear of Current or Ex-Partner: No   Emotionally Abused: No   Physically Abused: No   Sexually Abused: No   PHYSICAL EXAM  There were no vitals filed for this visit.  There is no height or weight on file to calculate BMI.  Generalized: Well developed, in no acute distress  MMSE - Mini Mental State Exam 04/05/2021 10/04/2020 03/29/2020  Orientation to time 0 1 1  Orientation to Place 5 5 5   Registration 3 3 1   Attention/ Calculation 1 0 2  Recall 0 1 0  Language- name 2 objects 2 2 2   Language- repeat 1 - 1  Language- follow 3 step command 3 - 3  Language- read & follow direction 1 - 1  Write a sentence 1 - 1  Copy design 0 - 1  Copy design-comments - - 7 animals  Total score 17 - 18    Neurological examination  Mentation: Alert, most history is provided by her son, is cooperative, in good spirits Cranial nerve II-XII: Pupils were equal round reactive to light. Extraocular movements were full, visual field were full on confrontational test. Facial sensation and strength were normal. Head turning and shoulder shrug  were normal and symmetric. Motor: The motor testing reveals 5 over 5 strength of all 4 extremities. Good symmetric motor tone is noted throughout.  Sensory: Sensory testing is intact to soft touch on all 4 extremities. No evidence of extinction is noted.  Coordination: Cerebellar testing reveals good finger-nose-finger and heel-to-shin bilaterally.  Gait and station: Gait is normal.  Reflexes: Deep tendon reflexes are symmetric and normal bilaterally.   DIAGNOSTIC DATA (LABS, IMAGING, TESTING) - I reviewed patient records, labs, notes, testing and imaging myself where available.  Lab Results  Component Value Date   WBC 10.1 03/30/2021   HGB 12.4 03/30/2021   HCT 37.2 03/30/2021   MCV 91.9 03/30/2021   PLT 269 03/30/2021      Component Value Date/Time   NA 144 03/30/2021 2006   NA 143 06/02/2018 1505   NA 145 09/18/2016 1057   K 4.1 03/30/2021 2006   K 4.9  09/18/2016 1057   CL 111 03/30/2021 2006   CO2 20 (L) 03/30/2021 2006   CO2 22 09/18/2016 1057   GLUCOSE 101 (H) 03/30/2021 2006   GLUCOSE 93 09/18/2016 1057   BUN 41 (H) 03/30/2021 2006   BUN 28 (H) 06/02/2018 1505   BUN 39.6 (H) 09/18/2016 1057   CREATININE 2.99 (H) 03/30/2021 2006   CREATININE 2.66 (H) 08/12/2020 0948   CREATININE 2.8 (H) 09/18/2016 1057   CALCIUM 9.3 03/30/2021 2006   CALCIUM 9.8 09/18/2016 1057   PROT 7.2 08/12/2020 0948   PROT 7.6 09/18/2016 1057   ALBUMIN 3.9 08/12/2020 0948   ALBUMIN 3.6 09/18/2016 1057   AST 21 08/12/2020 0948   AST 19 09/18/2016 1057   ALT 23 08/12/2020 0948   ALT 12 09/18/2016 1057   ALKPHOS 94 08/12/2020 0948   ALKPHOS 123 09/18/2016 1057   BILITOT 0.6 08/12/2020 0948   BILITOT 0.35 09/18/2016 1057   GFRNONAA 16 (L) 03/30/2021 2006   GFRNONAA 17 (L) 08/12/2020 0948   GFRAA 23 (L) 08/11/2019 1108   Lab Results  Component Value Date   CHOL 175 03/25/2018   HDL 39 (L) 03/25/2018   LDLCALC 115 (H) 03/25/2018   TRIG 103 03/25/2018   CHOLHDL 4.5 03/25/2018   Lab Results  Component Value Date   HGBA1C 6.9 (H)  05/16/2019   No results found for: VITAMINB12 No results found for: TSH    ASSESSMENT AND PLAN 76 y.o. 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 ([place]), Acute mastitis of right breast (10/2003), Arthritis, Cerebral aneurysm, nonruptured (07/16/2018), CKD (chronic kidney disease), stage IV ([place]), Colon polyps, COPD (chronic obstructive pulmonary disease) ([place]), Dementia ([place]) (07/16/2018), Family history of breast cancer, GERD (gastroesophageal reflux disease), Hypercholesterolemia, Hypertension, Non-small cell carcinoma of right lung, stage 1 ([place]) (05/17/2015), NSTEMI (non-ST elevated myocardial infarction) ([place]) (03/24/2018), Presence of permanent cardiac pacemaker (05/21/2018), Pure hypercholesterolemia, Sciatica of right side, Seasonal allergic rhinitis, Tobacco  dependence, Tubular adenoma of colon, and Vitamin D deficiency. here with:  1.  Dementia -MMSE 17/30 today, recent incidence of wandering out of the house -Continue Aricept 10 mg daily -Continue Namenda 10 mg twice a day -Discussed the need for further supervision, her son is checking into obtaining POA, adult daycare, sitter, given dementia packet today -Doesn't drive, but still living alone, this needs to be closely monitored -Follow-up in 8 months or sooner if needed  2.  Right anterior communicating artery aneurysm -Went for CTA in July 2021 but creatinine was elevated, was not completed, has pacemaker -I reached out to Dr. Arlean Hopping PA after last appointment, was told they would contact her nephrologist about moving forward with CTA, her son indicates they did not hear anything -Not clear anything would actually be done, given her age, Dementia, renal insufficiency, pacemaker, however will refer to IR, see if appointment can be arranged for consult with IR team, to discuss options if any with patient and her son    Evangeline Dakin, DNP 12/06/2021, 7:40 AM [facility] [address], [place], [place] [address] [phone]

## 2021-12-07 ENCOUNTER — Other Ambulatory Visit: Payer: Self-pay | Admitting: *Deleted

## 2021-12-07 NOTE — Patient Outreach (Signed)
Care Management Clinical Social Work Note  12/07/2021 Name: Betty [name] MRN: [id number] DOB: 1946/08/12  Betty [name] is a 76 y.o. year old female who is a primary care patient of Josetta Huddle, MD.  The Care Management team was consulted for assistance with chronic disease management and coordination needs.  Engaged with patient and son by telephone for follow up visit in response to provider referral for social work chronic care management and care coordination services  Consent to Services:  [name] was given information about Care Management services today including:  Care Management services includes personalized support from designated clinical staff supervised by her physician, including individualized plan of care and coordination with other care providers 24/7 contact phone numbers for assistance for urgent and routine care needs. The patient may stop case management services at any time by phone call to the office staff.  Patient agreed to services and consent obtained.   Assessment: Review of patient past medical history, allergies, medications, and health status, including review of relevant consultants reports was performed today as part of a comprehensive evaluation and provision of chronic care management and care coordination services.  SDOH (Social Determinants of Health) assessments and interventions performed:    Advanced Directives Status: Not addressed in this encounter.  Care Plan  Allergies  Allergen Reactions   Sulfa Antibiotics Itching    Outpatient Encounter Medications as of 12/07/2021  Medication Sig   acetaminophen (TYLENOL) 325 MG tablet Take 650 mg by mouth every 6 (six) hours as needed for mild pain or moderate pain.   aspirin EC 81 MG tablet Take 81 mg by mouth at bedtime.   atorvastatin (LIPITOR) 40 MG tablet TAKE 1 TABLET BY MOUTH EVERY DAY AT 6 PM   Cholecalciferol (VITAMIN D) 2000 units tablet Take 1,000 Units by mouth at bedtime.    donepezil  (ARICEPT) 10 MG tablet Take 1 tablet (10 mg total) by mouth at bedtime.   febuxostat (ULORIC) 40 MG tablet Take 40 mg by mouth at bedtime.    hydrALAZINE (APRESOLINE) 50 MG tablet Take 1 tablet (50 mg total) by mouth 3 (three) times daily.   isosorbide dinitrate (ISORDIL) 20 MG tablet Take 1 tablet (20 mg total) by mouth 3 (three) times daily.   memantine (NAMENDA) 10 MG tablet Take 1 tablet (10 mg total) by mouth 2 (two) times daily.   metoprolol succinate (TOPROL-XL) 50 MG 24 hr tablet Take 1 tablet (50 mg total) by mouth daily. Take with or immediately following a meal.   Multiple Vitamin (MULTIVITAMIN WITH MINERALS) TABS tablet Take 0.5 tablets by mouth at bedtime.   nitroGLYCERIN (NITROSTAT) 0.4 MG SL tablet Place 1 tablet (0.4 mg total) under the tongue every 5 (five) minutes x 3 doses as needed for chest pain.   omeprazole (PRILOSEC) 40 MG capsule Take 40 mg by mouth daily.   sodium bicarbonate 650 MG tablet Take 1 tablet (650 mg total) by mouth 2 (two) times daily.   No facility-administered encounter medications on file as of 12/07/2021.    Patient Active Problem List   Diagnosis Date Noted   CKD (chronic kidney disease) stage 5, GFR less than 15 ml/min (HCC) 11/14/2021   GI bleed 06/24/2019   Hypotension 06/24/2019   Electrolyte abnormality 06/24/2019   Pressure injury of skin 05/29/2019   Altered mental status    Acute respiratory failure with hypoxemia ([place])    COVID-19 05/14/2019   Essential hypertension 05/14/2019   Dementia ([place]) 07/16/2018   Cerebral  aneurysm, nonruptured 07/16/2018   Nonischemic cardiomyopathy ([place]) 05/21/2018   Congestive heart failure, NYHA class 3, chronic, systolic (HCC) 71/69/6789   Chronic systolic heart failure (HCC)    Chest pain 04/22/2018   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   NSTEMI (non-ST elevated myocardial infarction) ([place]) 03/24/2018   Pure hypercholesterolemia    CKD (chronic kidney disease), stage IV (HCC)     Non-small cell carcinoma of right lung, stage 1 ([place]) 05/17/2015   S/P lobectomy of lung 04/29/2015    Conditions to be addressed/monitored: CHF and Dementia.  Limited Social Support, Level of Care Concerns, ADL/IADL Limitations, Limited Access to Caregiver, Cognitive Deficits, Memory Deficits, and Lacks Knowledge of [employer].  Care Plan : LCSW Plan of Care  Updates made by Francis Gaines, LCSW since 12/07/2021 12:00 AM     Problem: Keeping Myself Safe in the Home. Resolved 12/07/2021  Priority: High     Goal: Keeping Myself Safe in the Home. Completed 12/07/2021  Start Date: 11/28/2021  Expected End Date: 12/07/2021  This Visit's Progress: On track  Recent Progress: On track  Priority: High  Note:   Current Barriers:   Patient with Cognitive and Neurobehavioral Dysfunction, Altered Mental Status, Stage V Chronic Kidney Disease, Dementia, Stage III Chronic Congestive Heart Failure, and Acute Respiratory Failure with Hypoxemia needs Support, Education, Referrals, Resources, Advocacy, and Care Coordination to resolve unmet personal care needs in the home. Patient lives alone and is unable to consistently perform ADL's/IADL's independently. Patient is refusing to pursue placement, of any kind, at this time. Lacks knowledge of available community agencies and resources. Clinical Goals:  Patient will have in-home care services in place, either through Rosealee Albee, Education officer, museum II with the [facility], or through a private agency of choice. Patient's son will work with LCSW and Rosealee Albee, Social Worker II with the [facility], to coordinate care for in-home aide services. Patient's son will receive, review and consider arranging in-home care services through a private agency of choice, from the list provided.     Patient will attend all scheduled  medical appointments, as evidenced by patient report and care team review of appointment completion in electronic medical record. Patient will demonstrate improved health management independence as evidenced by having in-home care services in place. Clinical Interventions: Collaboration with Primary Care Physician, Dr. Josetta Huddle regarding development and update of comprehensive plan of care as evidenced by provider attestation and co-signature. Inter-disciplinary care team collaboration (see longitudinal plan of care). Caregiver Stress Acknowledged and Consideration of In-Home Care Services Encouraged. Patient Goals/Self-Care Activities:  Son will periodically follow-up with Rosealee Albee, Social Worker II with the [place] [facility] [phone] or # [phone]), to check the status of your application. Son will continue contacting the following list of agencies and resources, in an effort to establish caregiver services in the home:               ~ [place]             ~ [place]             ~ [place] Providers             ~ [place] Son will contact LCSW directly (# [phone]) if he has questions, needs assistance, or  if additional social work needs are identified in the near future. No Follow-Up Required.   Nat Christen, BSW, MSW, LCSW  Licensed Education officer, [facility]  Mailing [place] [address], [place], [place] [address] Physical Address-[address] [place], [place] [address] Toll Free Main # [phone] Fax # [phone] Cell # [phone] [email or web address]

## 2021-12-13 NOTE — Progress Notes (Signed)
PATIENT: Betty [name] DOB: 07/12/1946  REASON FOR VISIT: follow up for memory  HISTORY FROM: patient PRIMARY NEUROLOGIST: Dr. Jannifer Franklin  HISTORY OF PRESENT ILLNESS: Today 12/14/21 Betty [name] here today for follow-up with history of dementia, right anterior communicating artery aneurysm.  For the aneurysm, they have decided they do not wish to continue to follow, or seek intervention. Has CKD, saw vascular about placing a fistula, but have decided not to pursue this.  Continues to live alone, there have been a few times she has left the home and gotten lost.  Her son now has a ring doorbell system.  She is looking into a sitter for adult day program.  Is working with a Education officer, museum to consider long-term care options.  Remains on Aricept and Namenda, she has an electronic pill dispenser.  Her son sees her daily.  She does her own ADLs.  He does her cooking. Her mood is good. Have close follow-up with PCP.  They mention the idea of returning here on an as-needed basis.   Update 04/05/2021 SS: Betty [name] this 76 year old female here today for follow-up for progressive memory disturbance, right anterior communicating artery aneurysm.  Has renal insufficiency with a pacemaker.  On Aricept and Namenda for memory. In the ER 03/30/21 for confusion, CT head stable. She doesn't remember this, her son called her at 3:30 she was at home, he went by her house at 4:30 she wasn't there, he couldn't find her, called police. Not sure how she got to [facility], but she was looking for her son, ended up in a shoe store, staff noticed she was confused called EMS. Now has outdoor cameras. Her son is checking into adult day care. Her is checking into getting POA. Health overall has been good. She doesn't drive. Today MMSE 17/30. Has been given something PRN for anxiety, not sure name. Sleeping well. Her housework has slipped, son does laundry, he brings meals.  Update 10/04/2020 SS: Betty [name] is a 76 year old female with  history of renal insufficiency and progressive memory disturbance.  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  She has a pacemaker.  Is on Aricept and Namenda.  Memory remains overall stable.  She continues to live alone, her son calls her every day, she takes her medications while they are on the phone.  Continues to do her own ADLs, housework.  Her son provides meals.  She is sleeping well, good appetite, weight remains stable.  Enjoys watching TV.  She does not drive, does not get out much, is cautious with Covid.  Overall health has been good.  Saw nephrology yesterday, everything was stable. Was going to have CTA in July with Dr. Estanislado Pandy, but was unable due to elevated creatinine, no scan was done.  This was for evaluation of aneurysm.  No falls, dizziness, or weakness.  Presents today for evaluation accompanied by her son, [name].  HISTORY 03/29/2020 SS: Betty [name] is a 76 year old female with history of renal insufficiency and progressive memory problem.  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  She has a pacemaker.  For her memory, she is on Aricept and Namenda.  Feels memory is overall stable.  She lives alone, her son check daily, he does the cooking.  He manages her medications.  She does her own ADLs, and some light housework.  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  She walks daily, half a mile or more with her son.  She sleeps well.  No recent falls.  Indicates, they have been unable to reach Dr. Estanislado Pandy, review of chart indicates MRA was scheduled back in December, but appointment was no-show.  Her overall health is good.  Denies any new problems or concerns.  Presents today for follow-up accompanied by her son.   REVIEW OF SYSTEMS: Out of a complete 14 system review of symptoms, the patient complains only of the following symptoms, and all  other reviewed systems are negative.  See HPI  ALLERGIES: Allergies  Allergen Reactions   Sulfa Antibiotics Itching    HOME MEDICATIONS: Outpatient Medications Prior to Visit  Medication Sig Dispense Refill   acetaminophen (TYLENOL) 325 MG tablet Take 650 mg by mouth every 6 (six) hours as needed for mild pain or moderate pain.     aspirin EC 81 MG tablet Take 81 mg by mouth at bedtime.     atorvastatin (LIPITOR) 40 MG tablet TAKE 1 TABLET BY MOUTH EVERY DAY AT 6 PM 90 tablet 1   Cholecalciferol (VITAMIN D) 2000 units tablet Take 1,000 Units by mouth at bedtime.      donepezil (ARICEPT) 10 MG tablet Take 1 tablet (10 mg total) by mouth at bedtime. 90 tablet 3   hydrALAZINE (APRESOLINE) 50 MG tablet Take 1 tablet (50 mg total) by mouth 3 (three) times daily. 270 tablet 3   isosorbide dinitrate (ISORDIL) 20 MG tablet Take 1 tablet (20 mg total) by mouth 3 (three) times daily. 270 tablet 1   memantine (NAMENDA) 10 MG tablet Take 1 tablet (10 mg total) by mouth 2 (two) times daily. 180 tablet 3   metoprolol succinate (TOPROL-XL) 50 MG 24 hr tablet Take 1 tablet (50 mg total) by mouth daily. Take with or immediately following a meal. 90 tablet 3   Multiple Vitamin (MULTIVITAMIN WITH MINERALS) TABS tablet Take 0.5 tablets by mouth at bedtime.     nitroGLYCERIN (NITROSTAT) 0.4 MG SL tablet Place 1 tablet (0.4 mg total) under the tongue every 5 (five) minutes x 3 doses as needed for chest pain. 25 tablet 12   omeprazole (PRILOSEC) 40 MG capsule Take 40 mg by mouth daily.     sodium bicarbonate 650 MG tablet Take 1 tablet (650 mg total) by mouth 2 (two) times daily. 30 tablet 0   febuxostat (ULORIC) 40 MG tablet Take 40 mg by mouth at bedtime.      No facility-administered medications prior to visit.    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 ([place])    Acute mastitis of right breast 10/2003   Arthritis     bursitis in right shoulder (05/21/2018)   Cerebral aneurysm, nonruptured 07/16/2018   Right anterior communicating artery   CKD (chronic kidney disease), stage IV (HCC)    Colon polyps    COPD (chronic obstructive pulmonary disease) (HCC)    emphysema   Dementia ([place]) 07/16/2018   Family history of breast cancer    GERD (gastroesophageal reflux disease)    Hypercholesterolemia    Hypertension    Non-small cell carcinoma of right lung, stage 1 ([place]) 05/17/2015   Stage IB, right upper lobectomy 04/29/15, chemo    NSTEMI (non-ST elevated myocardial infarction) ([place]) 03/24/2018   Presence of permanent cardiac pacemaker 05/21/2018   Pure hypercholesterolemia  Sciatica of right side    Seasonal allergic rhinitis    Tobacco dependence    Tubular adenoma of colon    Vitamin D deficiency     PAST SURGICAL HISTORY: Past Surgical History:  Procedure Laterality Date   BIOPSY  06/26/2019   Procedure: BIOPSY;  Surgeon: Wilford Corner, MD;  Location: [facility] ENDOSCOPY;  Service: Endoscopy;;   BIV PACEMAKER INSERTION CRT-P N/A 05/21/2018   Procedure: BIV PACEMAKER INSERTION CRT-P;  Surgeon: Deboraha Sprang, MD;  Location: [place] [facility] LAB;  Service: Cardiovascular;  Laterality: N/A;   BREAST SURGERY     small mass removed from left breast--benign   CRYO INTERCOSTAL NERVE BLOCK Right 04/29/2015   Procedure: CRYO INTERCOSTAL NERVE BLOCK;  Surgeon: [name], MD;  Location: [facility];  Service: Thoracic;  Laterality: Right;   ESOPHAGOGASTRODUODENOSCOPY (EGD) WITH PROPOFOL N/A 06/26/2019   Procedure: ESOPHAGOGASTRODUODENOSCOPY (EGD) WITH PROPOFOL;  Surgeon: Wilford Corner, MD;  Location: [facility];  Service: Endoscopy;  Laterality: N/A;   INSERT / REPLACE / REMOVE PACEMAKER  05/21/2018   LOBECTOMY Right 04/29/2015   Procedure: RIGHT LOWER LUNG LOBECTOMY ;  Surgeon: [name], MD;  Location: [facility];  Service: Thoracic;  Laterality: Right;   LYMPH NODE DISSECTION Right 04/29/2015    Procedure: RIGHT LUNG LYMPH NODE DISSECTION;  Surgeon: [name], MD;  Location: [facility];  Service: Thoracic;  Laterality: Right;   RIGHT/LEFT HEART CATH AND CORONARY ANGIOGRAPHY N/A 04/24/2018   Procedure: RIGHT/LEFT HEART CATH AND CORONARY ANGIOGRAPHY;  Surgeon: [name], MD;  Location: [place] CV LAB;  Service: Cardiovascular;  Laterality: N/A;   VAGINAL DELIVERY     51 yrs ago   VIDEO ASSISTED THORACOSCOPY (VATS)/ LOBECTOMY Right 04/29/2015   Procedure: RIGHT VIDEO ASSISTED THORACOSCOPY (VATS) WEDGE RESECTION/ RIGHT LOWER LOBECTOMY, CRYO-ANALGESIA OF INTERCOSTAL NERVES;  Surgeon: [name], MD;  Location: [facility] OR;  Service: Thoracic;  Laterality: Right;    FAMILY HISTORY: Family History  Problem Relation Age of Onset   Cancer Father    Cancer Sister    Heart attack Brother    Dementia Brother    Other Son        bicuspid aortic valve   Heart attack Brother    Cancer Sister     SOCIAL HISTORY: Social History   Socioeconomic History   Marital status: Single    Spouse name: Not on file   Number of children: 1   Years of education: 13   Highest education level: High school graduate  Occupational History   Occupation: Retired  Tobacco Use   Smoking status: Former    Packs/day: 0.75    Years: 47.00    Pack years: 35.25    Types: Cigarettes    Quit date: 04/29/2015    Years since quitting: 6.6    Passive exposure: Past   Smokeless tobacco: Never  Vaping Use   Vaping Use: Never used  Substance and Sexual Activity   Alcohol use: Yes    Comment: 05/21/2018 "maybe 6 drinks/year"   Drug use: Never   Sexual activity: Not on file  Other Topics Concern   Not on file  Social History Narrative   Lives alone   Caffeine use: 1 cup coffee per day   Right handed    Social Determinants of Health   Financial Resource Strain: Low Risk    Difficulty of Paying Living Expenses: Not hard at all  Food Insecurity: No Food Insecurity   Worried About  Running Out of Food in the Last Year: Never true   Ran Out of Food in the Last Year: Never true  Transportation Needs: No Transportation Needs   Lack of Transportation (Medical): No   Lack of Transportation (Non-Medical): No  Physical Activity: Inactive   Days of Exercise per Week: 0 days   Minutes of Exercise per Session: 0 min  Stress: No Stress Concern Present   Feeling of Stress : Not at all  Social Connections: Unknown   Frequency of Communication with Friends and Family: More than three times a week   Frequency of Social Gatherings with Friends and Family: More than three times a week   Attends Religious Services: 1 to 4 times per year   Active Member of [employer] or Organizations: No   Attends Archivist Meetings: Never   Marital Status: Not on file  Intimate Partner Violence: Not At Risk   Fear of Current or Ex-Partner: No   Emotionally Abused: No   Physically Abused: No   Sexually Abused: No   PHYSICAL EXAM  Vitals:   12/14/21 1447  BP: (!) 146/65  Pulse: 71  Weight: 123 lb (55.8 kg)  Height: 5\' 4"  (1.626 m)    Body mass index is 21.11 kg/m.  Generalized: Well developed, in no acute distress  MMSE - Mini Mental State Exam 12/14/2021 04/05/2021 10/04/2020  Orientation to time 1 0 1  Orientation to Place 5 5 5   Registration 3 3 3   Attention/ Calculation 5 1 0  Recall 0 0 1  Language- name 2 objects 2 2 2   Language- repeat 1 1 -  Language- follow 3 step command 3 3 -  Language- read & follow direction 1 1 -  Write a sentence 0 1 -  Copy design 0 0 -  Copy design-comments - - -  Total score 21 17 -    Neurological examination  Mentation: Alert, most history is provided by her son, follows exam commands well, speech is clear, laughs/jokes Cranial nerve II-XII: Pupils were equal round reactive to light. Extraocular movements were full, visual field were full on confrontational test. Facial sensation and strength were normal. Head turning and shoulder shrug   were normal and symmetric. Motor: The motor testing reveals 5 over 5 strength of all 4 extremities. Good symmetric motor tone is noted throughout.  Sensory: Sensory testing is intact to soft touch on all 4 extremities. No evidence of extinction is noted.  Coordination: Cerebellar testing reveals good finger-nose-finger and heel-to-shin bilaterally.  Gait and station: Gait is normal and independent. Reflexes: Deep tendon reflexes are symmetric and normal bilaterally.   DIAGNOSTIC DATA (LABS, IMAGING, TESTING) - I reviewed patient records, labs, notes, testing and imaging myself where available.  Lab Results  Component Value Date   WBC 10.1 03/30/2021   HGB 12.4 03/30/2021   HCT 37.2 03/30/2021   MCV 91.9 03/30/2021   PLT 269 03/30/2021      Component Value Date/Time   NA 144 03/30/2021 2006   NA 143 06/02/2018 1505   NA 145 09/18/2016 1057   K 4.1 03/30/2021 2006   K 4.9 09/18/2016 1057   CL 111 03/30/2021 2006   CO2 20 (L) 03/30/2021 2006   CO2 22 09/18/2016 1057   GLUCOSE 101 (H) 03/30/2021 2006   GLUCOSE 93 09/18/2016 1057   BUN 41 (H) 03/30/2021 2006   BUN 28 (H) 06/02/2018 1505   BUN 39.6 (H) 09/18/2016 1057   CREATININE 2.99 (H) 03/30/2021 2006  CREATININE 2.66 (H) 08/12/2020 0948   CREATININE 2.8 (H) 09/18/2016 1057   CALCIUM 9.3 03/30/2021 2006   CALCIUM 9.8 09/18/2016 1057   PROT 7.2 08/12/2020 0948   PROT 7.6 09/18/2016 1057   ALBUMIN 3.9 08/12/2020 0948   ALBUMIN 3.6 09/18/2016 1057   AST 21 08/12/2020 0948   AST 19 09/18/2016 1057   ALT 23 08/12/2020 0948   ALT 12 09/18/2016 1057   ALKPHOS 94 08/12/2020 0948   ALKPHOS 123 09/18/2016 1057   BILITOT 0.6 08/12/2020 0948   BILITOT 0.35 09/18/2016 1057   GFRNONAA 16 (L) 03/30/2021 2006   GFRNONAA 17 (L) 08/12/2020 0948   GFRAA 23 (L) 08/11/2019 1108   Lab Results  Component Value Date   CHOL 175 03/25/2018   HDL 39 (L) 03/25/2018   LDLCALC 115 (H) 03/25/2018   TRIG 103 03/25/2018   CHOLHDL 4.5  03/25/2018   Lab Results  Component Value Date   HGBA1C 6.9 (H) 05/16/2019   No results found for: VITAMINB12 No results found for: TSH  ASSESSMENT AND PLAN 76 y.o. 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 ([place]), Acute mastitis of right breast (10/2003), Arthritis, Cerebral aneurysm, nonruptured (07/16/2018), CKD (chronic kidney disease), stage IV ([place]), Colon polyps, COPD (chronic obstructive pulmonary disease) ([place]), Dementia ([place]) (07/16/2018), Family history of breast cancer, GERD (gastroesophageal reflux disease), Hypercholesterolemia, Hypertension, Non-small cell carcinoma of right lung, stage 1 ([place]) (05/17/2015), NSTEMI (non-ST elevated myocardial infarction) ([place]) (03/24/2018), Presence of permanent cardiac pacemaker (05/21/2018), Pure hypercholesterolemia, Sciatica of right side, Seasonal allergic rhinitis, Tobacco dependence, Tubular adenoma of colon, and Vitamin D deficiency. here with:  1.  Dementia  -MMSE 21/30 today, continues to have a progressive decline -Have discussed with her son about the need for increased supervision for safety, he is working with a Education officer, museum to consider his options -Continue Aricept 10 mg daily, Namenda 10 mg twice daily -Her dementia will now be followed by her primary care doctor, will return here on an as-needed basis  2.  Right anterior communicating artery aneurysm -They no longer wish to follow, would not want intervention -Went for CTA in July 2021 but creatinine was elevated, was not completed, has pacemaker; has had consultation with Dr. Estanislado Pandy in the past -Not clear anything would actually be done, given her age, Dementia, renal insufficiency, pacemaker  Butler Denmark, AGNP-C, DNP 12/14/2021, 3:03 PM [facility] [address] [place], [place] [address] [phone]

## 2021-12-14 ENCOUNTER — Ambulatory Visit (INDEPENDENT_AMBULATORY_CARE_PROVIDER_SITE_OTHER): Payer: Medicare Other | Admitting: Neurology

## 2021-12-14 ENCOUNTER — Encounter: Payer: Self-pay | Admitting: Neurology

## 2021-12-14 VITALS — BP 146/65 | HR 71 | Ht 64.0 in | Wt 123.0 lb

## 2021-12-14 DIAGNOSIS — I2510 Atherosclerotic heart disease of native coronary artery without angina pectoris: Secondary | ICD-10-CM | POA: Insufficient documentation

## 2021-12-14 DIAGNOSIS — K2091 Esophagitis, unspecified with bleeding: Secondary | ICD-10-CM | POA: Insufficient documentation

## 2021-12-14 DIAGNOSIS — Z860100 Personal history of colon polyps, unspecified: Secondary | ICD-10-CM | POA: Insufficient documentation

## 2021-12-14 DIAGNOSIS — I671 Cerebral aneurysm, nonruptured: Secondary | ICD-10-CM

## 2021-12-14 DIAGNOSIS — F01B0 Vascular dementia, moderate, without behavioral disturbance, psychotic disturbance, mood disturbance, and anxiety: Secondary | ICD-10-CM

## 2021-12-14 DIAGNOSIS — E559 Vitamin D deficiency, unspecified: Secondary | ICD-10-CM | POA: Insufficient documentation

## 2021-12-14 DIAGNOSIS — M25562 Pain in left knee: Secondary | ICD-10-CM | POA: Insufficient documentation

## 2021-12-14 DIAGNOSIS — C3430 Malignant neoplasm of lower lobe, unspecified bronchus or lung: Secondary | ICD-10-CM | POA: Insufficient documentation

## 2021-12-14 DIAGNOSIS — M1610 Unilateral primary osteoarthritis, unspecified hip: Secondary | ICD-10-CM | POA: Insufficient documentation

## 2021-12-14 DIAGNOSIS — M109 Gout, unspecified: Secondary | ICD-10-CM | POA: Insufficient documentation

## 2021-12-14 DIAGNOSIS — E782 Mixed hyperlipidemia: Secondary | ICD-10-CM | POA: Insufficient documentation

## 2021-12-14 DIAGNOSIS — F0150 Vascular dementia without behavioral disturbance: Secondary | ICD-10-CM | POA: Insufficient documentation

## 2021-12-14 DIAGNOSIS — Z950 Presence of cardiac pacemaker: Secondary | ICD-10-CM | POA: Insufficient documentation

## 2021-12-14 DIAGNOSIS — J309 Allergic rhinitis, unspecified: Secondary | ICD-10-CM | POA: Insufficient documentation

## 2021-12-14 DIAGNOSIS — K219 Gastro-esophageal reflux disease without esophagitis: Secondary | ICD-10-CM | POA: Insufficient documentation

## 2021-12-14 DIAGNOSIS — I700 Atherosclerosis of aorta: Secondary | ICD-10-CM | POA: Insufficient documentation

## 2021-12-14 DIAGNOSIS — Z720 Tobacco use: Secondary | ICD-10-CM | POA: Insufficient documentation

## 2021-12-14 DIAGNOSIS — J90 Pleural effusion, not elsewhere classified: Secondary | ICD-10-CM | POA: Insufficient documentation

## 2021-12-14 MED ORDER — DONEPEZIL HCL 10 MG PO TABS: 10.0000 mg | ORAL_TABLET | Freq: Every day | ORAL | 1 refills | Status: DC

## 2021-12-14 MED ORDER — MEMANTINE HCL 10 MG PO TABS: 10.0000 mg | ORAL_TABLET | Freq: Two times a day (BID) | ORAL | 1 refills | Status: DC

## 2021-12-14 NOTE — Patient Instructions (Signed)
Continue current medications. 

## 2022-01-04 DIAGNOSIS — I120 Hypertensive chronic kidney disease with stage 5 chronic kidney disease or end stage renal disease: Secondary | ICD-10-CM | POA: Diagnosis not present

## 2022-01-04 DIAGNOSIS — N250 Renal osteodystrophy: Secondary | ICD-10-CM | POA: Diagnosis not present

## 2022-01-04 DIAGNOSIS — D631 Anemia in chronic kidney disease: Secondary | ICD-10-CM | POA: Diagnosis not present

## 2022-01-04 DIAGNOSIS — E875 Hyperkalemia: Secondary | ICD-10-CM | POA: Diagnosis not present

## 2022-01-04 DIAGNOSIS — G9340 Encephalopathy, unspecified: Secondary | ICD-10-CM | POA: Diagnosis not present

## 2022-01-04 DIAGNOSIS — Z992 Dependence on renal dialysis: Secondary | ICD-10-CM | POA: Diagnosis not present

## 2022-01-04 DIAGNOSIS — N186 End stage renal disease: Secondary | ICD-10-CM | POA: Diagnosis not present

## 2022-02-06 ENCOUNTER — Ambulatory Visit (INDEPENDENT_AMBULATORY_CARE_PROVIDER_SITE_OTHER): Payer: Medicare Other

## 2022-02-06 DIAGNOSIS — I428 Other cardiomyopathies: Secondary | ICD-10-CM

## 2022-02-07 LAB — CUP PACEART REMOTE DEVICE CHECK
Battery Remaining Longevity: 34 mo
Battery Voltage: 2.93 V
Brady Statistic AP VP Percent: 11.54 %
Brady Statistic AP VS Percent: 0.27 %
Brady Statistic AS VP Percent: 82.3 %
Brady Statistic AS VS Percent: 5.89 %
Brady Statistic RA Percent Paced: 14.73 %
Brady Statistic RV Percent Paced: 0.29 %
Date Time Interrogation Session: 20230404023902
Implantable Lead Implant Date: 20190717
Implantable Lead Implant Date: 20190717
Implantable Lead Implant Date: 20190717
Implantable Lead Location: 753858
Implantable Lead Location: 753859
Implantable Lead Location: 753860
Implantable Lead Model: 5076
Implantable Lead Model: 5076
Implantable Pulse Generator Implant Date: 20190717
Lead Channel Impedance Value: 1064 Ohm
Lead Channel Impedance Value: 1083 Ohm
Lead Channel Impedance Value: 1102 Ohm
Lead Channel Impedance Value: 1178 Ohm
Lead Channel Impedance Value: 380 Ohm
Lead Channel Impedance Value: 437 Ohm
Lead Channel Impedance Value: 456 Ohm
Lead Channel Impedance Value: 494 Ohm
Lead Channel Impedance Value: 570 Ohm
Lead Channel Impedance Value: 608 Ohm
Lead Channel Impedance Value: 627 Ohm
Lead Channel Impedance Value: 684 Ohm
Lead Channel Impedance Value: 969 Ohm
Lead Channel Impedance Value: 988 Ohm
Lead Channel Pacing Threshold Amplitude: 0.625 V
Lead Channel Pacing Threshold Amplitude: 0.625 V
Lead Channel Pacing Threshold Amplitude: 3 V
Lead Channel Pacing Threshold Pulse Width: 0.4 ms
Lead Channel Pacing Threshold Pulse Width: 0.4 ms
Lead Channel Pacing Threshold Pulse Width: 0.6 ms
Lead Channel Sensing Intrinsic Amplitude: 2.5 mV
Lead Channel Sensing Intrinsic Amplitude: 2.5 mV
Lead Channel Sensing Intrinsic Amplitude: 8.75 mV
Lead Channel Sensing Intrinsic Amplitude: 8.75 mV
Lead Channel Setting Pacing Amplitude: 1.5 V
Lead Channel Setting Pacing Amplitude: 2 V
Lead Channel Setting Pacing Amplitude: 4.75 V
Lead Channel Setting Pacing Pulse Width: 0.4 ms
Lead Channel Setting Pacing Pulse Width: 0.6 ms
Lead Channel Setting Sensing Sensitivity: 1.2 mV

## 2022-02-12 ENCOUNTER — Other Ambulatory Visit: Payer: Self-pay | Admitting: *Deleted

## 2022-02-12 MED ORDER — ATORVASTATIN CALCIUM 40 MG PO TABS: ORAL_TABLET | ORAL | 0 refills | Status: DC

## 2022-02-22 NOTE — Progress Notes (Signed)
Remote pacemaker transmission.   

## 2022-03-08 DIAGNOSIS — N185 Chronic kidney disease, stage 5: Secondary | ICD-10-CM | POA: Diagnosis not present

## 2022-03-13 DIAGNOSIS — Z0000 Encounter for general adult medical examination without abnormal findings: Secondary | ICD-10-CM | POA: Diagnosis not present

## 2022-03-13 DIAGNOSIS — N2581 Secondary hyperparathyroidism of renal origin: Secondary | ICD-10-CM | POA: Diagnosis not present

## 2022-03-13 DIAGNOSIS — N184 Chronic kidney disease, stage 4 (severe): Secondary | ICD-10-CM | POA: Diagnosis not present

## 2022-03-13 DIAGNOSIS — Z1239 Encounter for other screening for malignant neoplasm of breast: Secondary | ICD-10-CM | POA: Diagnosis not present

## 2022-03-13 DIAGNOSIS — E872 Acidosis, unspecified: Secondary | ICD-10-CM | POA: Diagnosis not present

## 2022-03-13 DIAGNOSIS — M109 Gout, unspecified: Secondary | ICD-10-CM | POA: Diagnosis not present

## 2022-03-13 DIAGNOSIS — I120 Hypertensive chronic kidney disease with stage 5 chronic kidney disease or end stage renal disease: Secondary | ICD-10-CM | POA: Diagnosis not present

## 2022-03-13 DIAGNOSIS — D631 Anemia in chronic kidney disease: Secondary | ICD-10-CM | POA: Diagnosis not present

## 2022-03-13 DIAGNOSIS — E870 Hyperosmolality and hypernatremia: Secondary | ICD-10-CM | POA: Diagnosis not present

## 2022-03-13 DIAGNOSIS — I700 Atherosclerosis of aorta: Secondary | ICD-10-CM | POA: Diagnosis not present

## 2022-03-13 DIAGNOSIS — N185 Chronic kidney disease, stage 5: Secondary | ICD-10-CM | POA: Diagnosis not present

## 2022-03-13 DIAGNOSIS — I5020 Unspecified systolic (congestive) heart failure: Secondary | ICD-10-CM | POA: Diagnosis not present

## 2022-03-13 DIAGNOSIS — Z950 Presence of cardiac pacemaker: Secondary | ICD-10-CM | POA: Diagnosis not present

## 2022-03-14 ENCOUNTER — Other Ambulatory Visit: Payer: Self-pay | Admitting: Interventional Cardiology

## 2022-03-25 ENCOUNTER — Other Ambulatory Visit: Payer: Self-pay | Admitting: Interventional Cardiology

## 2022-03-27 DIAGNOSIS — Z1231 Encounter for screening mammogram for malignant neoplasm of breast: Secondary | ICD-10-CM | POA: Diagnosis not present

## 2022-03-28 ENCOUNTER — Other Ambulatory Visit: Payer: Self-pay | Admitting: Physician Assistant

## 2022-04-18 ENCOUNTER — Other Ambulatory Visit: Payer: Self-pay | Admitting: Interventional Cardiology

## 2022-04-21 ENCOUNTER — Other Ambulatory Visit: Payer: Self-pay | Admitting: Interventional Cardiology

## 2022-05-05 ENCOUNTER — Other Ambulatory Visit: Payer: Self-pay | Admitting: Interventional Cardiology

## 2022-05-06 ENCOUNTER — Other Ambulatory Visit: Payer: Self-pay | Admitting: Interventional Cardiology

## 2022-05-09 ENCOUNTER — Ambulatory Visit (INDEPENDENT_AMBULATORY_CARE_PROVIDER_SITE_OTHER): Payer: Medicare Other

## 2022-05-09 DIAGNOSIS — I428 Other cardiomyopathies: Secondary | ICD-10-CM | POA: Diagnosis not present

## 2022-05-10 ENCOUNTER — Other Ambulatory Visit: Payer: Self-pay | Admitting: *Deleted

## 2022-05-10 MED ORDER — ATORVASTATIN CALCIUM 40 MG PO TABS: ORAL_TABLET | ORAL | 0 refills | Status: DC

## 2022-05-11 ENCOUNTER — Other Ambulatory Visit: Payer: Self-pay | Admitting: Interventional Cardiology

## 2022-05-11 MED ORDER — ISOSORBIDE DINITRATE 20 MG PO TABS: 20.0000 mg | ORAL_TABLET | Freq: Three times a day (TID) | ORAL | 0 refills | Status: DC

## 2022-05-11 NOTE — Addendum Note (Signed)
Addended by: Carter Kitten D on: 05/11/2022 01:36 PM   Modules accepted: Orders

## 2022-05-12 LAB — CUP PACEART REMOTE DEVICE CHECK
Battery Remaining Longevity: 28 mo
Battery Voltage: 2.92 V
Brady Statistic AP VP Percent: 19.25 %
Brady Statistic AP VS Percent: 0.46 %
Brady Statistic AS VP Percent: 73.02 %
Brady Statistic AS VS Percent: 7.28 %
Brady Statistic RA Percent Paced: 23.65 %
Brady Statistic RV Percent Paced: 0.79 %
Date Time Interrogation Session: 20230704023204
Implantable Lead Implant Date: 20190717
Implantable Lead Implant Date: 20190717
Implantable Lead Implant Date: 20190717
Implantable Lead Location: 753858
Implantable Lead Location: 753859
Implantable Lead Location: 753860
Implantable Lead Model: 5076
Implantable Lead Model: 5076
Implantable Pulse Generator Implant Date: 20190717
Lead Channel Impedance Value: 1045 Ohm
Lead Channel Impedance Value: 1083 Ohm
Lead Channel Impedance Value: 304 Ohm
Lead Channel Impedance Value: 361 Ohm
Lead Channel Impedance Value: 361 Ohm
Lead Channel Impedance Value: 437 Ohm
Lead Channel Impedance Value: 513 Ohm
Lead Channel Impedance Value: 532 Ohm
Lead Channel Impedance Value: 551 Ohm
Lead Channel Impedance Value: 665 Ohm
Lead Channel Impedance Value: 874 Ohm
Lead Channel Impedance Value: 893 Ohm
Lead Channel Impedance Value: 969 Ohm
Lead Channel Impedance Value: 988 Ohm
Lead Channel Pacing Threshold Amplitude: 0.625 V
Lead Channel Pacing Threshold Amplitude: 0.75 V
Lead Channel Pacing Threshold Amplitude: 3.25 V
Lead Channel Pacing Threshold Pulse Width: 0.4 ms
Lead Channel Pacing Threshold Pulse Width: 0.4 ms
Lead Channel Pacing Threshold Pulse Width: 0.6 ms
Lead Channel Sensing Intrinsic Amplitude: 2 mV
Lead Channel Sensing Intrinsic Amplitude: 2 mV
Lead Channel Sensing Intrinsic Amplitude: 7.875 mV
Lead Channel Sensing Intrinsic Amplitude: 7.875 mV
Lead Channel Setting Pacing Amplitude: 1.5 V
Lead Channel Setting Pacing Amplitude: 2 V
Lead Channel Setting Pacing Amplitude: 4.75 V
Lead Channel Setting Pacing Pulse Width: 0.4 ms
Lead Channel Setting Pacing Pulse Width: 0.6 ms
Lead Channel Setting Sensing Sensitivity: 1.2 mV

## 2022-05-16 ENCOUNTER — Other Ambulatory Visit: Payer: Self-pay | Admitting: Interventional Cardiology

## 2022-05-30 NOTE — Progress Notes (Signed)
Remote pacemaker transmission.   

## 2022-06-28 ENCOUNTER — Other Ambulatory Visit: Payer: Self-pay

## 2022-06-28 MED ORDER — MEMANTINE HCL 10 MG PO TABS: 10.0000 mg | ORAL_TABLET | Freq: Two times a day (BID) | ORAL | 1 refills | Status: DC

## 2022-06-30 ENCOUNTER — Other Ambulatory Visit: Payer: Self-pay

## 2022-06-30 ENCOUNTER — Emergency Department (HOSPITAL_COMMUNITY)
Admission: EM | Admit: 2022-06-30 | Discharge: 2022-06-30 | Disposition: A | Discharge status: Home / Self Care | Payer: Medicare Other | Attending: Emergency Medicine | Admitting: Emergency Medicine

## 2022-06-30 ENCOUNTER — Emergency Department (HOSPITAL_COMMUNITY): Payer: Medicare Other

## 2022-06-30 ENCOUNTER — Encounter (HOSPITAL_COMMUNITY): Payer: Self-pay

## 2022-06-30 DIAGNOSIS — I130 Hypertensive heart and chronic kidney disease with heart failure and stage 1 through stage 4 chronic kidney disease, or unspecified chronic kidney disease: Secondary | ICD-10-CM | POA: Diagnosis not present

## 2022-06-30 DIAGNOSIS — N189 Chronic kidney disease, unspecified: Secondary | ICD-10-CM | POA: Insufficient documentation

## 2022-06-30 DIAGNOSIS — Z7982 Long term (current) use of aspirin: Secondary | ICD-10-CM | POA: Diagnosis not present

## 2022-06-30 DIAGNOSIS — F0390 Unspecified dementia without behavioral disturbance: Secondary | ICD-10-CM | POA: Diagnosis not present

## 2022-06-30 DIAGNOSIS — R0602 Shortness of breath: Secondary | ICD-10-CM | POA: Diagnosis not present

## 2022-06-30 DIAGNOSIS — I509 Heart failure, unspecified: Secondary | ICD-10-CM | POA: Insufficient documentation

## 2022-06-30 DIAGNOSIS — Z79899 Other long term (current) drug therapy: Secondary | ICD-10-CM | POA: Diagnosis not present

## 2022-06-30 DIAGNOSIS — Z743 Need for continuous supervision: Secondary | ICD-10-CM | POA: Diagnosis not present

## 2022-06-30 DIAGNOSIS — T675XXA Heat exhaustion, unspecified, initial encounter: Secondary | ICD-10-CM | POA: Diagnosis not present

## 2022-06-30 DIAGNOSIS — T679XXA Effect of heat and light, unspecified, initial encounter: Secondary | ICD-10-CM

## 2022-06-30 DIAGNOSIS — R61 Generalized hyperhidrosis: Secondary | ICD-10-CM | POA: Diagnosis not present

## 2022-06-30 HISTORY — DX: Encounter for other specified aftercare: Z51.89

## 2022-06-30 LAB — CBC WITH DIFFERENTIAL/PLATELET
Abs Immature Granulocytes: 0.01 10*3/uL (ref 0.00–0.07)
Basophils Absolute: 0 10*3/uL (ref 0.0–0.1)
Basophils Relative: 1 %
Eosinophils Absolute: 0.1 10*3/uL (ref 0.0–0.5)
Eosinophils Relative: 1 %
HCT: 32.8 % — ABNORMAL LOW (ref 36.0–46.0)
Hemoglobin: 10.9 g/dL — ABNORMAL LOW (ref 12.0–15.0)
Immature Granulocytes: 0 %
Lymphocytes Relative: 30 %
Lymphs Abs: 1.6 10*3/uL (ref 0.7–4.0)
MCH: 31 pg (ref 26.0–34.0)
MCHC: 33.2 g/dL (ref 30.0–36.0)
MCV: 93.2 fL (ref 80.0–100.0)
Monocytes Absolute: 0.5 10*3/uL (ref 0.1–1.0)
Monocytes Relative: 9 %
Neutro Abs: 3.2 10*3/uL (ref 1.7–7.7)
Neutrophils Relative %: 59 %
Platelets: 217 10*3/uL (ref 150–400)
RBC: 3.52 MIL/uL — ABNORMAL LOW (ref 3.87–5.11)
RDW: 12.5 % (ref 11.5–15.5)
WBC: 5.4 10*3/uL (ref 4.0–10.5)
nRBC: 0 % (ref 0.0–0.2)

## 2022-06-30 LAB — BASIC METABOLIC PANEL
Anion gap: 7 (ref 5–15)
BUN: 36 mg/dL — ABNORMAL HIGH (ref 8–23)
CO2: 24 mmol/L (ref 22–32)
Calcium: 8.8 mg/dL — ABNORMAL LOW (ref 8.9–10.3)
Chloride: 113 mmol/L — ABNORMAL HIGH (ref 98–111)
Creatinine, Ser: 2.93 mg/dL — ABNORMAL HIGH (ref 0.44–1.00)
GFR, Estimated: 16 mL/min — ABNORMAL LOW (ref >60)
Glucose, Bld: 89 mg/dL (ref 70–99)
Potassium: 3.5 mmol/L (ref 3.5–5.1)
Sodium: 144 mmol/L (ref 135–145)

## 2022-06-30 NOTE — ED Triage Notes (Addendum)
Pt bib ems from gas station. Per EMS pt walked to gas station from home and started feeling sob and dizzy. Pt states she was wearing a sweatshirt and thinks she felt this way because it was warm. Symptoms improved once on ems truck. Denies pain. Hx dementia

## 2022-06-30 NOTE — ED Notes (Signed)
Called son Charley Lafrance left v/m letting him know pt brought in by ems.

## 2022-06-30 NOTE — ED Provider Notes (Signed)
[facility] DEPT Provider Note   CSN: [id number] Arrival date & time: 06/30/22  1549     History  Chief Complaint  Patient presents with   Shortness of Breath    Betty [name] is a 76 y.o. female.   Pt is a 76y/o female with hx of hypertension, CHF, hyperlipidemia, CKD, dementia who is still living independently and is monitored very closely by her son who reports today she has a camera at her house and he saw that she locked herself out of the house and could not get back in.  She then started walking and was in a sweatshirt in the 90 degree heat.  They found her by a gas station and she was complaining of shortness of breath and dizziness.  When she got into the ambulance and cooled down she reports she now feels back to normal.  Initially patient's son was not present during her evaluation.  She reports she thought she may have passed out but she cannot remember what happened.  She cannot remember why she was walking walking outside.  Patient's son confirms that she is currently at her baseline.  Patient denies any chest pain, shortness of breath or dizziness at this time.   Shortness of Breath      Home Medications Prior to Admission medications   Medication Sig Start Date End Date Taking? Authorizing Provider  acetaminophen (TYLENOL) 325 MG tablet Take 650 mg by mouth every 6 (six) hours as needed for mild pain or moderate pain.    [provider]  aspirin EC 81 MG tablet Take 81 mg by mouth at bedtime.    [provider]  atorvastatin (LIPITOR) 40 MG tablet TAKE 1 TABLET BY MOUTH EVERY DAY AT 6 PM 05/17/22   Belva Crome, MD  Cholecalciferol (VITAMIN D) 2000 units tablet Take 1,000 Units by mouth at bedtime.     [provider]  donepezil (ARICEPT) 10 MG tablet Take 1 tablet (10 mg total) by mouth at bedtime. 12/14/21   Suzzanne Cloud, NP  hydrALAZINE (APRESOLINE) 50 MG tablet TAKE 1 TABLET(50 MG) BY MOUTH THREE TIMES DAILY  05/17/22   Belva Crome, MD  isosorbide dinitrate (ISORDIL) 20 MG tablet Take 1 tablet (20 mg total) by mouth 3 (three) times daily. Please keep upcoming appt with Cardiologist in August 2023 before anymore refills. Thank you.Final Attempt 05/11/22   Belva Crome, MD  memantine (NAMENDA) 10 MG tablet Take 1 tablet (10 mg total) by mouth 2 (two) times daily. 06/28/22   Suzzanne Cloud, NP  metoprolol succinate (TOPROL-XL) 50 MG 24 hr tablet Take 1 tablet (50 mg total) by mouth daily. Take with or immediately following a meal. 10/02/21   Belva Crome, MD  Multiple Vitamin (MULTIVITAMIN WITH MINERALS) TABS tablet Take 0.5 tablets by mouth at bedtime.    [provider]  nitroGLYCERIN (NITROSTAT) 0.4 MG SL tablet Place 1 tablet (0.4 mg total) under the tongue every 5 (five) minutes x 3 doses as needed for chest pain. 04/26/18   Daune Perch, NP  omeprazole (PRILOSEC) 40 MG capsule Take 40 mg by mouth daily.    [provider]  sodium bicarbonate 650 MG tablet Take 1 tablet (650 mg total) by mouth 2 (two) times daily. 07/02/19   [name] Latif, DO      Allergies    Sulfa antibiotics    Review of Systems   Review of Systems  Respiratory:  Positive  for shortness of breath.     Physical Exam Updated Vital Signs BP (!) 139/95 (BP Location: Right Arm)   Pulse 61   Temp 98.3 F (36.8 C) (Oral)   Resp 20   Ht 5\' 4"  (1.626 m)   Wt 61.2 kg   SpO2 98%   BMI 23.17 kg/m  Physical Exam Vitals and nursing note reviewed.  Constitutional:      General: She is not in acute distress.    Appearance: She is well-developed.  HENT:     Head: Normocephalic and atraumatic.  Eyes:     Pupils: Pupils are equal, round, and reactive to light.  Cardiovascular:     Rate and Rhythm: Normal rate and regular rhythm.     Heart sounds: Normal heart sounds. No murmur heard.    No friction rub.  Pulmonary:     Effort: Pulmonary effort is normal.     Breath sounds: Normal breath sounds. No  wheezing or rales.     Comments: This maker present in the left upper chest Abdominal:     General: Bowel sounds are normal. There is no distension.     Palpations: Abdomen is soft.     Tenderness: There is no abdominal tenderness. There is no guarding or rebound.  Musculoskeletal:        General: No tenderness. Normal range of motion.     Comments: No edema  Skin:    General: Skin is warm and dry.     Findings: No rash.  Neurological:     General: No focal deficit present.     Mental Status: She is alert.     Cranial Nerves: No cranial nerve deficit.     Comments: Oriented to person and place  Psychiatric:        Behavior: Behavior normal.     ED Results / Procedures / Treatments   Labs (all labs ordered are listed, but only abnormal results are displayed) Labs Reviewed  CBC WITH DIFFERENTIAL/PLATELET - Abnormal; Notable for the following components:      Result Value   RBC 3.52 (*)    Hemoglobin 10.9 (*)    HCT 32.8 (*)    All other components within normal limits  BASIC METABOLIC PANEL - Abnormal; Notable for the following components:   Chloride 113 (*)    BUN 36 (*)    Creatinine, Ser 2.93 (*)    Calcium 8.8 (*)    GFR, Estimated 16 (*)    All other components within normal limits    EKG None  Radiology No results found.  Procedures Procedures    Medications Ordered in ED Medications - No data to display  ED Course/ Medical Decision Making/ A&P                           Medical Decision Making Amount and/or Complexity of Data Reviewed Independent Historian: caregiver and EMS    Details: Paramedics and pt's son External Data Reviewed: notes.   Pt with multiple medical problems and comorbidities and presenting today with a complaint that caries a high risk for morbidity and mortality.  Caryl Pina being brought in by EMS reporting that patient was complaining of shortness of breath and dizziness but was found walking outside in a sweatshirt.  Patient's son  is now present and reports that she does suffer from dementia and she is still living independently however on his ring camera today he saw that she locked  herself out of the house and could not get back in.  They were looking for her and EMS found her first.  Patient has no complaints at this time.  Initially because she had reported she had syncope interrogation of the pacemaker was ordered as well as some baseline labs.  However her son reports that there was no syncope, he does not wish for her to have any further work-up at this time.  He feels like she is at her baseline and the only reason she is here is because EMS found her first.  Patient reports she feels fine and she would like to leave.  She has close follow-up with her medical doctors and at this time will discharge patient home with her son.         Final Clinical Impression(s) / ED Diagnoses Final diagnoses:  Heat exposure, initial encounter    Rx / DC Orders ED Discharge Orders     None         Blanchie Dessert, MD 06/30/22 1758

## 2022-07-03 NOTE — Progress Notes (Signed)
Cardiology Office Note:    Date:  07/04/2022   ID:  Betty [name], DOB [birth year], MRN [id number]  PCP:  Josetta Huddle, MD  Cardiologist:  Sinclair Grooms, MD   Referring MD: Josetta Huddle, MD   Chief Complaint  Patient presents with   Congestive Heart Failure   Coronary Artery Disease    History of Present Illness:    Betty [name] is a 76 y.o. female with the following profile: Betty [name] is a 76 y.o. female with:  Coronary artery disease  Cath 6/19: pRCA 90 (small, non-dominant) >> Med Rx  (HFrEF) heart failure with reduced ejection fraction Ischemic/non-ischemic cardiomyopathy  Echocardiogram 4/22: EF 25-30 S/p CRT-P NSVT Chronic kidney disease stage IV  LBBB Dementia  Hx of COVID -19 w/ assoc pneumonia/ARDS in 05/2019 (intubated x 2, prolonged hosp stay) Chronic kidney disease  COPD Hypertension  Hyperlipidemia  [place] Lung CA, stage 1 (2016) S/p RLL lobectomy   SUBJECTIVE Doing well.  She denies chest pain.  No orthopnea, PND, lower extremity swelling, and overall feels that she is doing well.  She is able to lie flat.  Appetite has been stable.  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 ([place])    Acute mastitis of right breast 10/2003   Arthritis    bursitis in right shoulder (05/21/2018)   Blood transfusion without reported diagnosis    Cerebral aneurysm, nonruptured 07/16/2018   Right anterior communicating artery   CKD (chronic kidney disease), stage IV (HCC)    Colon polyps    COPD (chronic obstructive pulmonary disease) (HCC)    emphysema   Dementia ([place]) 07/16/2018   Family history of breast cancer    GERD (gastroesophageal reflux disease)    Hypercholesterolemia    Hypertension    Non-small cell carcinoma of right lung, stage 1 ([place]) 05/17/2015   Stage IB, right upper lobectomy 04/29/15, chemo    NSTEMI (non-ST elevated myocardial infarction) ([place]) 03/24/2018   Presence of permanent  cardiac pacemaker 05/21/2018   Pure hypercholesterolemia    Sciatica of right side    Seasonal allergic rhinitis    Tobacco dependence    Tubular adenoma of colon    Vitamin D deficiency     Past Surgical History:  Procedure Laterality Date   BIOPSY  06/26/2019   Procedure: BIOPSY;  Surgeon: Wilford Corner, MD;  Location: [facility];  Service: Endoscopy;;   BIV PACEMAKER INSERTION CRT-P N/A 05/21/2018   Procedure: BIV PACEMAKER INSERTION CRT-P;  Surgeon: Deboraha Sprang, MD;  Location: [facility];  Service: Cardiovascular;  Laterality: N/A;   BREAST SURGERY     small mass removed from left breast--benign   CRYO INTERCOSTAL NERVE BLOCK Right 04/29/2015   Procedure: CRYO INTERCOSTAL NERVE BLOCK;  Surgeon: [name], MD;  Location: [facility];  Service: Thoracic;  Laterality: Right;   ESOPHAGOGASTRODUODENOSCOPY (EGD) WITH PROPOFOL N/A 06/26/2019   Procedure: ESOPHAGOGASTRODUODENOSCOPY (EGD) WITH PROPOFOL;  Surgeon: Wilford Corner, MD;  Location: [facility];  Service: Endoscopy;  Laterality: N/A;   INSERT / REPLACE / REMOVE PACEMAKER  05/21/2018   LOBECTOMY Right 04/29/2015   Procedure: RIGHT LOWER LUNG LOBECTOMY ;  Surgeon: [name], MD;  Location: [facility];  Service: Thoracic;  Laterality: Right;   LYMPH NODE DISSECTION Right 04/29/2015   Procedure: RIGHT LUNG LYMPH NODE DISSECTION;  Surgeon: [name], MD;  Location: [facility];  Service: Thoracic;  Laterality: Right;  RIGHT/LEFT HEART CATH AND CORONARY ANGIOGRAPHY N/A 04/24/2018   Procedure: RIGHT/LEFT HEART CATH AND CORONARY ANGIOGRAPHY;  Surgeon: [name], MD;  Location: [facility];  Service: Cardiovascular;  Laterality: N/A;   VAGINAL DELIVERY     51 yrs ago   VIDEO ASSISTED THORACOSCOPY (VATS)/ LOBECTOMY Right 04/29/2015   Procedure: RIGHT VIDEO ASSISTED THORACOSCOPY (VATS) WEDGE RESECTION/ RIGHT LOWER LOBECTOMY, CRYO-ANALGESIA OF INTERCOSTAL NERVES;  Surgeon: [name],  MD;  Location: [facility];  Service: Thoracic;  Laterality: Right;    Current Medications: Current Meds  Medication Sig   acetaminophen (TYLENOL) 325 MG tablet Take 650 mg by mouth every 6 (six) hours as needed for mild pain or moderate pain.   aspirin EC 81 MG tablet Take 81 mg by mouth at bedtime.   atorvastatin (LIPITOR) 40 MG tablet TAKE 1 TABLET BY MOUTH EVERY DAY AT 6 PM   Cholecalciferol (VITAMIN D) 2000 units tablet Take 1,000 Units by mouth at bedtime.    donepezil (ARICEPT) 10 MG tablet Take 1 tablet (10 mg total) by mouth at bedtime.   hydrALAZINE (APRESOLINE) 50 MG tablet TAKE 1 TABLET(50 MG) BY MOUTH THREE TIMES DAILY   isosorbide dinitrate (ISORDIL) 20 MG tablet Take 1 tablet (20 mg total) by mouth 3 (three) times daily. Please keep upcoming appt with Cardiologist in August 2023 before anymore refills. Thank you.Final Attempt   memantine (NAMENDA) 10 MG tablet Take 1 tablet (10 mg total) by mouth 2 (two) times daily.   metoprolol succinate (TOPROL-XL) 50 MG 24 hr tablet Take 1 tablet (50 mg total) by mouth daily. Take with or immediately following a meal.   Multiple Vitamin (MULTIVITAMIN WITH MINERALS) TABS tablet Take 0.5 tablets by mouth at bedtime.   nitroGLYCERIN (NITROSTAT) 0.4 MG SL tablet Place 1 tablet (0.4 mg total) under the tongue every 5 (five) minutes x 3 doses as needed for chest pain.   omeprazole (PRILOSEC) 40 MG capsule Take 40 mg by mouth daily.   sodium bicarbonate 650 MG tablet Take 1 tablet (650 mg total) by mouth 2 (two) times daily.     Allergies:   Sulfa antibiotics   Social History   Socioeconomic History   Marital status: Single    Spouse name: Not on file   Number of children: 1   Years of education: 13   Highest education level: High school graduate  Occupational History   Occupation: Retired  Tobacco Use   Smoking status: Former    Packs/day: 0.75    Years: 47.00    Total pack years: 35.25    Types: Cigarettes    Quit date: 04/29/2015     Years since quitting: 7.1    Passive exposure: Past   Smokeless tobacco: Never  Vaping Use   Vaping Use: Never used  Substance and Sexual Activity   Alcohol use: Yes    Comment: 05/21/2018 "maybe 6 drinks/year"   Drug use: Never   Sexual activity: Not on file  Other Topics Concern   Not on file  Social History Narrative   Lives alone   Caffeine use: 1 cup coffee per day   Right handed    Social Determinants of Health   Financial Resource Strain: Low Risk  (11/28/2021)   Overall Financial Resource Strain (CARDIA)    Difficulty of Paying Living Expenses: Not hard at all  Food Insecurity: No Food Insecurity (11/28/2021)   Hunger Vital Sign    Worried About Running Out of Food in the Last  Year: Never true    [place] in the Last Year: Never true  Transportation Needs: No Transportation Needs (11/28/2021)   PRAPARE - Hydrologist (Medical): No    Lack of Transportation (Non-Medical): No  Physical Activity: Inactive (11/28/2021)   Exercise Vital Sign    Days of Exercise per Week: 0 days    Minutes of Exercise per Session: 0 min  Stress: No Stress Concern Present (11/28/2021)   [place]    Feeling of Stress : Not at all  Social Connections: Unknown (11/28/2021)   Social Connection and Isolation Panel [NHANES]    Frequency of Communication with Friends and Family: More than three times a week    Frequency of Social Gatherings with Friends and Family: More than three times a week    Attends Religious Services: 1 to 4 times per year    Active Member of [employer] or Organizations: No    Attends Music therapist: Never    Marital Status: Not on file     Family History: The patient's family history includes Cancer in her father, sister, and sister; Dementia in her brother; Heart attack in her brother and brother; Other in her son.  ROS:   Please see the history of present  illness.    Appetite is good.  Decreased memory.  All other systems reviewed and are negative.  EKGs/Labs/Other Studies Reviewed:    The following studies were reviewed today:  2D Doppler echocardiogram 02/08/2021: IMPRESSIONS   1. Left ventricular ejection fraction, by estimation, is 25 to 30%. The  left ventricle has severely decreased function. The left ventricle  demonstrates global hypokinesis. Left ventricular diastolic parameters are  consistent with Grade I diastolic  dysfunction (impaired relaxation). Elevated left ventricular end-diastolic  pressure.   2. Right ventricular systolic function is normal. The right ventricular  size is normal. There is normal pulmonary artery systolic pressure. The  estimated right ventricular systolic pressure is 36.1 mmHg.   3. The mitral valve is normal in structure. No evidence of mitral valve  regurgitation. No evidence of mitral stenosis.   4. The aortic valve is normal in structure. Aortic valve regurgitation is  not visualized. No aortic stenosis is present.   5. Aortic dilatation noted. There is borderline dilatation of the  ascending aorta, measuring 37 mm.   6. The inferior vena cava is normal in size with greater than 50%  respiratory variability, suggesting right atrial pressure of 3 mmHg.   EKG:  EKG atrial tracking with ventricular pacing.  Recent Labs: 06/30/2022: BUN 36; Creatinine, Ser 2.93; Hemoglobin 10.9; Platelets 217; Potassium 3.5; Sodium 144  Recent Lipid Panel    Component Value Date/Time   CHOL 175 03/25/2018 0216   TRIG 103 03/25/2018 0216   HDL 39 (L) 03/25/2018 0216   CHOLHDL 4.5 03/25/2018 0216   VLDL 21 03/25/2018 0216   LDLCALC 115 (H) 03/25/2018 0216    Physical Exam:    VS:  BP 128/60   Pulse 66   Ht 5\' 4"  (1.626 m)   Wt 135 lb (61.2 kg)   SpO2 96%   BMI 23.17 kg/m     Wt Readings from Last 3 Encounters:  07/04/22 135 lb (61.2 kg)  06/30/22 135 lb (61.2 kg)  12/14/21 123 lb (55.8 kg)      GEN: Elderly, slender,. No acute distress HEENT: Normal NECK: No JVD. LYMPHATICS: No lymphadenopathy CARDIAC: S4  gallop no murmur. RRR no S3 gallop, or edema. VASCULAR:  Normal Pulses. No bruits. RESPIRATORY:  Clear to auscultation without rales, wheezing or rhonchi  ABDOMEN: Soft, non-tender, non-distended, No pulsatile mass, MUSCULOSKELETAL: No deformity  SKIN: Warm and dry NEUROLOGIC:  Alert and oriented x 3 PSYCHIATRIC:  Normal affect   ASSESSMENT:    1. HFrEF (heart failure with reduced ejection fraction) ([place])   2. Coronary artery disease involving native coronary artery of native heart without angina pectoris   3. Nonischemic cardiomyopathy ([place])   4. CKD (chronic kidney disease), stage IV ([place])   5. Biventricular cardiac pacemaker in situ    PLAN:    In order of problems listed above:  2D Doppler echocardiogram prior to the next office visit.  BMP and BNP will be done prior to the next office visit.  Continue the current medication regimen.  I have been concerned about adding an SGLT2 given the level of her creatinine.  This will need to be coordinated with her nephrologist, Dr. Carolin Sicks Secondary prevention discussed.  No angina. 2D echocardiogram in 5 or 6 months. Continue to follow with primary nephrologist Continue to follow in device clinic  Guideline directed therapy for left ventricular systolic dysfunction: Angiotensin receptor-neprilysin inhibitor (ARNI)-Entresto; beta-blocker therapy - carvedilol, metoprolol succinate, or bisoprolol; mineralocorticoid receptor antagonist (MRA) therapy -spironolactone or eplerenone.  SGLT-2 agents -  Dapagliflozin [place]) or Empagliflozin ([place]).These therapies have been shown to improve clinical outcomes including reduction of rehospitalization, survival, and acute heart failure.  She is not a candidate for Entresto/spironolactone/angiotensin receptor blocker or ACE inhibitor's.   Follow-up with random assignment for  cardiology attending.  Overall patient is doing well.  Medication Adjustments/Labs and Tests Ordered: Current medicines are reviewed at length with the patient today.  Concerns regarding medicines are outlined above.  No orders of the defined types were placed in this encounter.  No orders of the defined types were placed in this encounter.   There are no Patient Instructions on file for this visit.   Signed, Sinclair Grooms, MD  07/04/2022 4:29 PM    [place]

## 2022-07-04 ENCOUNTER — Ambulatory Visit: Payer: Medicare Other | Attending: Interventional Cardiology | Admitting: Interventional Cardiology

## 2022-07-04 ENCOUNTER — Encounter: Payer: Self-pay | Admitting: Interventional Cardiology

## 2022-07-04 VITALS — BP 128/60 | HR 66 | Ht 64.0 in | Wt 135.0 lb

## 2022-07-04 DIAGNOSIS — I2510 Atherosclerotic heart disease of native coronary artery without angina pectoris: Secondary | ICD-10-CM | POA: Diagnosis not present

## 2022-07-04 DIAGNOSIS — I428 Other cardiomyopathies: Secondary | ICD-10-CM | POA: Diagnosis not present

## 2022-07-04 DIAGNOSIS — Z950 Presence of cardiac pacemaker: Secondary | ICD-10-CM | POA: Diagnosis not present

## 2022-07-04 DIAGNOSIS — N184 Chronic kidney disease, stage 4 (severe): Secondary | ICD-10-CM

## 2022-07-04 DIAGNOSIS — I5020 Unspecified systolic (congestive) heart failure: Secondary | ICD-10-CM

## 2022-07-04 NOTE — Patient Instructions (Addendum)
Medication Instructions:  Your physician recommends that you continue on your current medications as directed. Please refer to the Current Medication list given to you today.  *If you need a refill on your cardiac medications before your next appointment, please call your pharmacy*  Lab Work: NONE  Testing/Procedures: Your physician has requested that you have an echocardiogram in January 2024. Echocardiography is a painless test that uses sound waves to create images of your heart. It provides your doctor with information about the size and shape of your heart and how well your heart's chambers and valves are working. This procedure takes approximately one hour. There are no restrictions for this procedure.  Follow-Up: At [facility], you and your health needs are our priority.  As part of our continuing mission to provide you with exceptional heart care, we have created designated Provider Care Teams.  These Care Teams include your primary Cardiologist (physician) and Advanced Practice Providers (APPs -  Physician Assistants and Nurse Practitioners) who all work together to provide you with the care you need, when you need it.  Your next appointment:   6 month(s)  The format for your next appointment:   In Person  Provider:   Sinclair Grooms, MD    Important Information About [place]

## 2022-07-13 ENCOUNTER — Other Ambulatory Visit: Payer: Self-pay | Admitting: Interventional Cardiology

## 2022-07-13 DIAGNOSIS — N185 Chronic kidney disease, stage 5: Secondary | ICD-10-CM | POA: Diagnosis not present

## 2022-07-17 DIAGNOSIS — D631 Anemia in chronic kidney disease: Secondary | ICD-10-CM | POA: Diagnosis not present

## 2022-07-17 DIAGNOSIS — N2581 Secondary hyperparathyroidism of renal origin: Secondary | ICD-10-CM | POA: Diagnosis not present

## 2022-07-17 DIAGNOSIS — I120 Hypertensive chronic kidney disease with stage 5 chronic kidney disease or end stage renal disease: Secondary | ICD-10-CM | POA: Diagnosis not present

## 2022-07-17 DIAGNOSIS — E872 Acidosis, unspecified: Secondary | ICD-10-CM | POA: Diagnosis not present

## 2022-07-17 DIAGNOSIS — N185 Chronic kidney disease, stage 5: Secondary | ICD-10-CM | POA: Diagnosis not present

## 2022-07-17 DIAGNOSIS — E870 Hyperosmolality and hypernatremia: Secondary | ICD-10-CM | POA: Diagnosis not present

## 2022-07-19 DIAGNOSIS — Z961 Presence of intraocular lens: Secondary | ICD-10-CM | POA: Diagnosis not present

## 2022-07-19 DIAGNOSIS — H2511 Age-related nuclear cataract, right eye: Secondary | ICD-10-CM | POA: Diagnosis not present

## 2022-07-19 DIAGNOSIS — H25011 Cortical age-related cataract, right eye: Secondary | ICD-10-CM | POA: Diagnosis not present

## 2022-07-22 ENCOUNTER — Other Ambulatory Visit: Payer: Self-pay | Admitting: Interventional Cardiology

## 2022-08-06 ENCOUNTER — Other Ambulatory Visit: Payer: Self-pay | Admitting: Interventional Cardiology

## 2022-08-07 ENCOUNTER — Ambulatory Visit (INDEPENDENT_AMBULATORY_CARE_PROVIDER_SITE_OTHER): Payer: Medicare Other

## 2022-08-07 DIAGNOSIS — I428 Other cardiomyopathies: Secondary | ICD-10-CM | POA: Diagnosis not present

## 2022-08-07 LAB — CUP PACEART REMOTE DEVICE CHECK
Battery Remaining Longevity: 26 mo
Battery Voltage: 2.91 V
Brady Statistic AP VP Percent: 24.51 %
Brady Statistic AP VS Percent: 0.58 %
Brady Statistic AS VP Percent: 68.25 %
Brady Statistic AS VS Percent: 6.66 %
Brady Statistic RA Percent Paced: 29.21 %
Brady Statistic RV Percent Paced: 0.23 %
Date Time Interrogation Session: 20231003042514
Implantable Lead Implant Date: 20190717
Implantable Lead Implant Date: 20190717
Implantable Lead Implant Date: 20190717
Implantable Lead Location: 753858
Implantable Lead Location: 753859
Implantable Lead Location: 753860
Implantable Lead Model: 5076
Implantable Lead Model: 5076
Implantable Pulse Generator Implant Date: 20190717
Lead Channel Impedance Value: 1026 Ohm
Lead Channel Impedance Value: 1064 Ohm
Lead Channel Impedance Value: 1083 Ohm
Lead Channel Impedance Value: 1083 Ohm
Lead Channel Impedance Value: 323 Ohm
Lead Channel Impedance Value: 361 Ohm
Lead Channel Impedance Value: 399 Ohm
Lead Channel Impedance Value: 475 Ohm
Lead Channel Impedance Value: 494 Ohm
Lead Channel Impedance Value: 551 Ohm
Lead Channel Impedance Value: 608 Ohm
Lead Channel Impedance Value: 646 Ohm
Lead Channel Impedance Value: 855 Ohm
Lead Channel Impedance Value: 931 Ohm
Lead Channel Pacing Threshold Amplitude: 0.625 V
Lead Channel Pacing Threshold Amplitude: 0.75 V
Lead Channel Pacing Threshold Amplitude: 3.25 V
Lead Channel Pacing Threshold Pulse Width: 0.4 ms
Lead Channel Pacing Threshold Pulse Width: 0.4 ms
Lead Channel Pacing Threshold Pulse Width: 0.6 ms
Lead Channel Sensing Intrinsic Amplitude: 1.875 mV
Lead Channel Sensing Intrinsic Amplitude: 1.875 mV
Lead Channel Sensing Intrinsic Amplitude: 9.25 mV
Lead Channel Sensing Intrinsic Amplitude: 9.25 mV
Lead Channel Setting Pacing Amplitude: 1.5 V
Lead Channel Setting Pacing Amplitude: 2 V
Lead Channel Setting Pacing Amplitude: 4.25 V
Lead Channel Setting Pacing Pulse Width: 0.4 ms
Lead Channel Setting Pacing Pulse Width: 0.6 ms
Lead Channel Setting Sensing Sensitivity: 1.2 mV

## 2022-08-20 NOTE — Progress Notes (Signed)
Remote pacemaker transmission.   

## 2022-09-12 ENCOUNTER — Other Ambulatory Visit: Payer: Self-pay | Admitting: Interventional Cardiology

## 2022-09-18 DIAGNOSIS — I10 Essential (primary) hypertension: Secondary | ICD-10-CM | POA: Diagnosis not present

## 2022-09-18 DIAGNOSIS — Z23 Encounter for immunization: Secondary | ICD-10-CM | POA: Diagnosis not present

## 2022-09-18 DIAGNOSIS — N185 Chronic kidney disease, stage 5: Secondary | ICD-10-CM | POA: Diagnosis not present

## 2022-09-18 DIAGNOSIS — I5020 Unspecified systolic (congestive) heart failure: Secondary | ICD-10-CM | POA: Diagnosis not present

## 2022-09-18 DIAGNOSIS — M109 Gout, unspecified: Secondary | ICD-10-CM | POA: Diagnosis not present

## 2022-09-18 DIAGNOSIS — Z950 Presence of cardiac pacemaker: Secondary | ICD-10-CM | POA: Diagnosis not present

## 2022-10-11 DIAGNOSIS — L608 Other nail disorders: Secondary | ICD-10-CM | POA: Diagnosis not present

## 2022-10-11 DIAGNOSIS — F01518 Vascular dementia, unspecified severity, with other behavioral disturbance: Secondary | ICD-10-CM | POA: Diagnosis not present

## 2022-10-23 ENCOUNTER — Ambulatory Visit (INDEPENDENT_AMBULATORY_CARE_PROVIDER_SITE_OTHER): Payer: Medicare Other | Admitting: Podiatry

## 2022-10-23 DIAGNOSIS — M79674 Pain in right toe(s): Secondary | ICD-10-CM

## 2022-10-23 DIAGNOSIS — B351 Tinea unguium: Secondary | ICD-10-CM

## 2022-10-23 DIAGNOSIS — M79675 Pain in left toe(s): Secondary | ICD-10-CM

## 2022-10-24 NOTE — Progress Notes (Signed)
  Subjective:  Patient ID: Betty [name], female    DOB: Mar 09, 1946,  MRN: [id number]  Chief Complaint  Patient presents with   Nail Problem    (np) Hyperkeratosis of nail; bilateral    76 y.o. female presents with the above complaint. History confirmed with patient.   Objective:  Physical Exam: warm, good capillary refill, no trophic changes or ulcerative lesions, normal DP and PT pulses, and normal sensory exam. Left Foot: dystrophic yellowed discolored nail plates with subungual debris Right Foot: dystrophic yellowed discolored nail plates with subungual debris   Assessment:   1. Pain due to onychomycosis of toenails of both feet      Plan:  Patient was evaluated and treated and all questions answered.  Discussed the etiology and treatment options for the condition in detail with the patient. Educated patient on the topical and oral treatment options for mycotic nails. Recommended debridement of the nails today. Sharp and mechanical debridement performed of all painful and mycotic nails today. Nails debrided in length and thickness using a nail nipper to level of comfort. Discussed treatment options including appropriate shoe gear. Follow up as needed for painful nails.   Return in about 3 months (around 01/22/2023) for painful thickened nails.

## 2022-11-06 ENCOUNTER — Ambulatory Visit (INDEPENDENT_AMBULATORY_CARE_PROVIDER_SITE_OTHER): Payer: Medicare Other

## 2022-11-06 DIAGNOSIS — I428 Other cardiomyopathies: Secondary | ICD-10-CM

## 2022-11-06 LAB — CUP PACEART REMOTE DEVICE CHECK
Battery Remaining Longevity: 27 mo
Battery Voltage: 2.9 V
Brady Statistic AP VP Percent: 13.9 %
Brady Statistic AP VS Percent: 0.35 %
Brady Statistic AS VP Percent: 80.99 %
Brady Statistic AS VS Percent: 4.76 %
Brady Statistic RA Percent Paced: 16.7 %
Brady Statistic RV Percent Paced: 0.32 %
Date Time Interrogation Session: 20240102032032
Implantable Lead Connection Status: 753985
Implantable Lead Connection Status: 753985
Implantable Lead Connection Status: 753985
Implantable Lead Implant Date: 20190717
Implantable Lead Implant Date: 20190717
Implantable Lead Implant Date: 20190717
Implantable Lead Location: 753858
Implantable Lead Location: 753859
Implantable Lead Location: 753860
Implantable Lead Model: 5076
Implantable Lead Model: 5076
Implantable Pulse Generator Implant Date: 20190717
Lead Channel Impedance Value: 1007 Ohm
Lead Channel Impedance Value: 1045 Ohm
Lead Channel Impedance Value: 1102 Ohm
Lead Channel Impedance Value: 1102 Ohm
Lead Channel Impedance Value: 323 Ohm
Lead Channel Impedance Value: 361 Ohm
Lead Channel Impedance Value: 380 Ohm
Lead Channel Impedance Value: 437 Ohm
Lead Channel Impedance Value: 513 Ohm
Lead Channel Impedance Value: 551 Ohm
Lead Channel Impedance Value: 589 Ohm
Lead Channel Impedance Value: 703 Ohm
Lead Channel Impedance Value: 874 Ohm
Lead Channel Impedance Value: 893 Ohm
Lead Channel Pacing Threshold Amplitude: 0.625 V
Lead Channel Pacing Threshold Amplitude: 0.875 V
Lead Channel Pacing Threshold Amplitude: 2.75 V
Lead Channel Pacing Threshold Pulse Width: 0.4 ms
Lead Channel Pacing Threshold Pulse Width: 0.4 ms
Lead Channel Pacing Threshold Pulse Width: 0.6 ms
Lead Channel Sensing Intrinsic Amplitude: 11.25 mV
Lead Channel Sensing Intrinsic Amplitude: 11.25 mV
Lead Channel Sensing Intrinsic Amplitude: 2 mV
Lead Channel Sensing Intrinsic Amplitude: 2 mV
Lead Channel Setting Pacing Amplitude: 1.5 V
Lead Channel Setting Pacing Amplitude: 2 V
Lead Channel Setting Pacing Amplitude: 4.25 V
Lead Channel Setting Pacing Pulse Width: 0.4 ms
Lead Channel Setting Pacing Pulse Width: 0.6 ms
Lead Channel Setting Sensing Sensitivity: 1.2 mV
Zone Setting Status: 755011
Zone Setting Status: 755011

## 2022-11-14 ENCOUNTER — Ambulatory Visit (HOSPITAL_COMMUNITY): Payer: Medicare Other | Attending: Interventional Cardiology

## 2022-11-14 DIAGNOSIS — I5020 Unspecified systolic (congestive) heart failure: Secondary | ICD-10-CM | POA: Insufficient documentation

## 2022-11-14 LAB — ECHOCARDIOGRAM COMPLETE
Area-P 1/2: 4.39 cm2
S' Lateral: 3.03 cm

## 2022-12-07 NOTE — Progress Notes (Signed)
Remote pacemaker transmission.   

## 2022-12-13 ENCOUNTER — Ambulatory Visit: Payer: Medicare Other | Admitting: Internal Medicine

## 2023-01-22 ENCOUNTER — Ambulatory Visit (INDEPENDENT_AMBULATORY_CARE_PROVIDER_SITE_OTHER): Payer: Medicare Other | Admitting: Podiatry

## 2023-01-22 DIAGNOSIS — M79674 Pain in right toe(s): Secondary | ICD-10-CM

## 2023-01-22 DIAGNOSIS — M79675 Pain in left toe(s): Secondary | ICD-10-CM

## 2023-01-22 DIAGNOSIS — B351 Tinea unguium: Secondary | ICD-10-CM | POA: Diagnosis not present

## 2023-01-23 NOTE — Progress Notes (Signed)
  Subjective:  Patient ID: Betty [name], female    DOB: 09-11-1946,  MRN: [id number]  Chief Complaint  Patient presents with   Nail Problem    Thick painful toenails, 3 month follow up    77 y.o. female presents with the above complaint. History confirmed with patient.  Previous debridement was helpful in reducing pain and discomfort and she would like to continue with this plan.  Objective:  Physical Exam: warm, good capillary refill, no trophic changes or ulcerative lesions, normal DP and PT pulses, and normal sensory exam. Left Foot: dystrophic yellowed discolored nail plates with subungual debris Right Foot: dystrophic yellowed discolored nail plates with subungual debris   Assessment:   1. Pain due to onychomycosis of toenails of both feet      Plan:  Patient was evaluated and treated and all questions answered.  Discussed the etiology and treatment options for the condition in detail with the patient.  Intermittent debridement has been helpful. Recommended debridement of the nails today. Sharp and mechanical debridement performed of all painful and mycotic nails today. Nails debrided in length and thickness using a nail nipper to level of comfort. Discussed treatment options including appropriate shoe gear. Follow up as needed for painful nails.   Return in about 3 months (around 04/24/2023), or if symptoms worsen or fail to improve, for painful thick fungal nails.

## 2023-02-05 ENCOUNTER — Ambulatory Visit (INDEPENDENT_AMBULATORY_CARE_PROVIDER_SITE_OTHER): Payer: Medicare Other

## 2023-02-05 DIAGNOSIS — I428 Other cardiomyopathies: Secondary | ICD-10-CM

## 2023-02-06 LAB — CUP PACEART REMOTE DEVICE CHECK
Battery Remaining Longevity: 22 mo
Battery Voltage: 2.89 V
Brady Statistic AP VP Percent: 11.95 %
Brady Statistic AP VS Percent: 0.3 %
Brady Statistic AS VP Percent: 83.84 %
Brady Statistic AS VS Percent: 3.92 %
Brady Statistic RA Percent Paced: 13.82 %
Brady Statistic RV Percent Paced: 1.1 %
Date Time Interrogation Session: 20240402041258
Implantable Lead Connection Status: 753985
Implantable Lead Connection Status: 753985
Implantable Lead Connection Status: 753985
Implantable Lead Implant Date: 20190717
Implantable Lead Implant Date: 20190717
Implantable Lead Implant Date: 20190717
Implantable Lead Location: 753858
Implantable Lead Location: 753859
Implantable Lead Location: 753860
Implantable Lead Model: 5076
Implantable Lead Model: 5076
Implantable Pulse Generator Implant Date: 20190717
Lead Channel Impedance Value: 1083 Ohm
Lead Channel Impedance Value: 1102 Ohm
Lead Channel Impedance Value: 1197 Ohm
Lead Channel Impedance Value: 342 Ohm
Lead Channel Impedance Value: 361 Ohm
Lead Channel Impedance Value: 418 Ohm
Lead Channel Impedance Value: 437 Ohm
Lead Channel Impedance Value: 475 Ohm
Lead Channel Impedance Value: 513 Ohm
Lead Channel Impedance Value: 608 Ohm
Lead Channel Impedance Value: 703 Ohm
Lead Channel Impedance Value: 836 Ohm
Lead Channel Impedance Value: 912 Ohm
Lead Channel Impedance Value: 988 Ohm
Lead Channel Pacing Threshold Amplitude: 0.625 V
Lead Channel Pacing Threshold Amplitude: 0.625 V
Lead Channel Pacing Threshold Amplitude: 3.25 V
Lead Channel Pacing Threshold Pulse Width: 0.4 ms
Lead Channel Pacing Threshold Pulse Width: 0.4 ms
Lead Channel Pacing Threshold Pulse Width: 0.6 ms
Lead Channel Sensing Intrinsic Amplitude: 2.125 mV
Lead Channel Sensing Intrinsic Amplitude: 2.125 mV
Lead Channel Sensing Intrinsic Amplitude: 9.25 mV
Lead Channel Sensing Intrinsic Amplitude: 9.25 mV
Lead Channel Setting Pacing Amplitude: 1.5 V
Lead Channel Setting Pacing Amplitude: 2 V
Lead Channel Setting Pacing Amplitude: 4.25 V
Lead Channel Setting Pacing Pulse Width: 0.4 ms
Lead Channel Setting Pacing Pulse Width: 0.6 ms
Lead Channel Setting Sensing Sensitivity: 1.2 mV
Zone Setting Status: 755011
Zone Setting Status: 755011

## 2023-03-07 DIAGNOSIS — I132 Hypertensive heart and chronic kidney disease with heart failure and with stage 5 chronic kidney disease, or end stage renal disease: Secondary | ICD-10-CM | POA: Diagnosis not present

## 2023-03-07 DIAGNOSIS — I5020 Unspecified systolic (congestive) heart failure: Secondary | ICD-10-CM | POA: Diagnosis not present

## 2023-03-07 DIAGNOSIS — N185 Chronic kidney disease, stage 5: Secondary | ICD-10-CM | POA: Diagnosis not present

## 2023-03-07 DIAGNOSIS — I700 Atherosclerosis of aorta: Secondary | ICD-10-CM | POA: Diagnosis not present

## 2023-03-07 DIAGNOSIS — J410 Simple chronic bronchitis: Secondary | ICD-10-CM | POA: Diagnosis not present

## 2023-03-07 DIAGNOSIS — R5383 Other fatigue: Secondary | ICD-10-CM | POA: Diagnosis not present

## 2023-03-07 DIAGNOSIS — G309 Alzheimer's disease, unspecified: Secondary | ICD-10-CM | POA: Diagnosis not present

## 2023-03-18 DIAGNOSIS — F17211 Nicotine dependence, cigarettes, in remission: Secondary | ICD-10-CM | POA: Diagnosis not present

## 2023-03-18 DIAGNOSIS — I2510 Atherosclerotic heart disease of native coronary artery without angina pectoris: Secondary | ICD-10-CM | POA: Diagnosis not present

## 2023-03-18 DIAGNOSIS — Z86010 Personal history of colonic polyps: Secondary | ICD-10-CM | POA: Diagnosis not present

## 2023-03-18 DIAGNOSIS — R5383 Other fatigue: Secondary | ICD-10-CM | POA: Diagnosis not present

## 2023-03-18 DIAGNOSIS — J309 Allergic rhinitis, unspecified: Secondary | ICD-10-CM | POA: Diagnosis not present

## 2023-03-18 DIAGNOSIS — E559 Vitamin D deficiency, unspecified: Secondary | ICD-10-CM | POA: Diagnosis not present

## 2023-03-18 DIAGNOSIS — Z0000 Encounter for general adult medical examination without abnormal findings: Secondary | ICD-10-CM | POA: Diagnosis not present

## 2023-03-18 DIAGNOSIS — G309 Alzheimer's disease, unspecified: Secondary | ICD-10-CM | POA: Diagnosis not present

## 2023-03-18 DIAGNOSIS — K219 Gastro-esophageal reflux disease without esophagitis: Secondary | ICD-10-CM | POA: Diagnosis not present

## 2023-03-18 DIAGNOSIS — I132 Hypertensive heart and chronic kidney disease with heart failure and with stage 5 chronic kidney disease, or end stage renal disease: Secondary | ICD-10-CM | POA: Diagnosis not present

## 2023-03-18 DIAGNOSIS — M1611 Unilateral primary osteoarthritis, right hip: Secondary | ICD-10-CM | POA: Diagnosis not present

## 2023-03-18 DIAGNOSIS — E782 Mixed hyperlipidemia: Secondary | ICD-10-CM | POA: Diagnosis not present

## 2023-03-18 DIAGNOSIS — I10 Essential (primary) hypertension: Secondary | ICD-10-CM | POA: Diagnosis not present

## 2023-03-18 DIAGNOSIS — I5020 Unspecified systolic (congestive) heart failure: Secondary | ICD-10-CM | POA: Diagnosis not present

## 2023-03-20 NOTE — Progress Notes (Signed)
Remote pacemaker transmission.   

## 2023-03-25 DIAGNOSIS — J410 Simple chronic bronchitis: Secondary | ICD-10-CM | POA: Diagnosis not present

## 2023-03-25 DIAGNOSIS — I132 Hypertensive heart and chronic kidney disease with heart failure and with stage 5 chronic kidney disease, or end stage renal disease: Secondary | ICD-10-CM | POA: Diagnosis not present

## 2023-03-25 DIAGNOSIS — Z86010 Personal history of colonic polyps: Secondary | ICD-10-CM | POA: Diagnosis not present

## 2023-03-25 DIAGNOSIS — E559 Vitamin D deficiency, unspecified: Secondary | ICD-10-CM | POA: Diagnosis not present

## 2023-03-25 DIAGNOSIS — J309 Allergic rhinitis, unspecified: Secondary | ICD-10-CM | POA: Diagnosis not present

## 2023-03-25 DIAGNOSIS — N185 Chronic kidney disease, stage 5: Secondary | ICD-10-CM | POA: Diagnosis not present

## 2023-03-25 DIAGNOSIS — Z556 Problems related to health literacy: Secondary | ICD-10-CM | POA: Diagnosis not present

## 2023-03-25 DIAGNOSIS — I2510 Atherosclerotic heart disease of native coronary artery without angina pectoris: Secondary | ICD-10-CM | POA: Diagnosis not present

## 2023-03-25 DIAGNOSIS — Z950 Presence of cardiac pacemaker: Secondary | ICD-10-CM | POA: Diagnosis not present

## 2023-03-25 DIAGNOSIS — M1611 Unilateral primary osteoarthritis, right hip: Secondary | ICD-10-CM | POA: Diagnosis not present

## 2023-03-25 DIAGNOSIS — I5020 Unspecified systolic (congestive) heart failure: Secondary | ICD-10-CM | POA: Diagnosis not present

## 2023-03-25 DIAGNOSIS — Z87891 Personal history of nicotine dependence: Secondary | ICD-10-CM | POA: Diagnosis not present

## 2023-03-25 DIAGNOSIS — G309 Alzheimer's disease, unspecified: Secondary | ICD-10-CM | POA: Diagnosis not present

## 2023-03-25 DIAGNOSIS — Z7982 Long term (current) use of aspirin: Secondary | ICD-10-CM | POA: Diagnosis not present

## 2023-03-25 DIAGNOSIS — K219 Gastro-esophageal reflux disease without esophagitis: Secondary | ICD-10-CM | POA: Diagnosis not present

## 2023-03-25 DIAGNOSIS — E7800 Pure hypercholesterolemia, unspecified: Secondary | ICD-10-CM | POA: Diagnosis not present

## 2023-03-26 DIAGNOSIS — K219 Gastro-esophageal reflux disease without esophagitis: Secondary | ICD-10-CM | POA: Diagnosis not present

## 2023-03-26 DIAGNOSIS — I132 Hypertensive heart and chronic kidney disease with heart failure and with stage 5 chronic kidney disease, or end stage renal disease: Secondary | ICD-10-CM | POA: Diagnosis not present

## 2023-03-26 DIAGNOSIS — N185 Chronic kidney disease, stage 5: Secondary | ICD-10-CM | POA: Diagnosis not present

## 2023-03-26 DIAGNOSIS — Z86010 Personal history of colonic polyps: Secondary | ICD-10-CM | POA: Diagnosis not present

## 2023-03-26 DIAGNOSIS — I2510 Atherosclerotic heart disease of native coronary artery without angina pectoris: Secondary | ICD-10-CM | POA: Diagnosis not present

## 2023-03-26 DIAGNOSIS — E7800 Pure hypercholesterolemia, unspecified: Secondary | ICD-10-CM | POA: Diagnosis not present

## 2023-03-26 DIAGNOSIS — J410 Simple chronic bronchitis: Secondary | ICD-10-CM | POA: Diagnosis not present

## 2023-03-26 DIAGNOSIS — I5020 Unspecified systolic (congestive) heart failure: Secondary | ICD-10-CM | POA: Diagnosis not present

## 2023-03-26 DIAGNOSIS — M1611 Unilateral primary osteoarthritis, right hip: Secondary | ICD-10-CM | POA: Diagnosis not present

## 2023-03-26 DIAGNOSIS — G309 Alzheimer's disease, unspecified: Secondary | ICD-10-CM | POA: Diagnosis not present

## 2023-03-26 DIAGNOSIS — E559 Vitamin D deficiency, unspecified: Secondary | ICD-10-CM | POA: Diagnosis not present

## 2023-03-26 DIAGNOSIS — Z7982 Long term (current) use of aspirin: Secondary | ICD-10-CM | POA: Diagnosis not present

## 2023-03-26 DIAGNOSIS — Z556 Problems related to health literacy: Secondary | ICD-10-CM | POA: Diagnosis not present

## 2023-03-26 DIAGNOSIS — J309 Allergic rhinitis, unspecified: Secondary | ICD-10-CM | POA: Diagnosis not present

## 2023-03-26 DIAGNOSIS — Z87891 Personal history of nicotine dependence: Secondary | ICD-10-CM | POA: Diagnosis not present

## 2023-03-26 DIAGNOSIS — Z950 Presence of cardiac pacemaker: Secondary | ICD-10-CM | POA: Diagnosis not present

## 2023-04-02 DIAGNOSIS — Z1231 Encounter for screening mammogram for malignant neoplasm of breast: Secondary | ICD-10-CM | POA: Diagnosis not present

## 2023-04-03 DIAGNOSIS — M1611 Unilateral primary osteoarthritis, right hip: Secondary | ICD-10-CM | POA: Diagnosis not present

## 2023-04-03 DIAGNOSIS — I2510 Atherosclerotic heart disease of native coronary artery without angina pectoris: Secondary | ICD-10-CM | POA: Diagnosis not present

## 2023-04-03 DIAGNOSIS — Z87891 Personal history of nicotine dependence: Secondary | ICD-10-CM | POA: Diagnosis not present

## 2023-04-03 DIAGNOSIS — Z86010 Personal history of colonic polyps: Secondary | ICD-10-CM | POA: Diagnosis not present

## 2023-04-03 DIAGNOSIS — Z7982 Long term (current) use of aspirin: Secondary | ICD-10-CM | POA: Diagnosis not present

## 2023-04-03 DIAGNOSIS — K219 Gastro-esophageal reflux disease without esophagitis: Secondary | ICD-10-CM | POA: Diagnosis not present

## 2023-04-03 DIAGNOSIS — Z556 Problems related to health literacy: Secondary | ICD-10-CM | POA: Diagnosis not present

## 2023-04-03 DIAGNOSIS — J410 Simple chronic bronchitis: Secondary | ICD-10-CM | POA: Diagnosis not present

## 2023-04-03 DIAGNOSIS — J309 Allergic rhinitis, unspecified: Secondary | ICD-10-CM | POA: Diagnosis not present

## 2023-04-03 DIAGNOSIS — I132 Hypertensive heart and chronic kidney disease with heart failure and with stage 5 chronic kidney disease, or end stage renal disease: Secondary | ICD-10-CM | POA: Diagnosis not present

## 2023-04-03 DIAGNOSIS — E559 Vitamin D deficiency, unspecified: Secondary | ICD-10-CM | POA: Diagnosis not present

## 2023-04-03 DIAGNOSIS — E7800 Pure hypercholesterolemia, unspecified: Secondary | ICD-10-CM | POA: Diagnosis not present

## 2023-04-03 DIAGNOSIS — Z950 Presence of cardiac pacemaker: Secondary | ICD-10-CM | POA: Diagnosis not present

## 2023-04-03 DIAGNOSIS — G309 Alzheimer's disease, unspecified: Secondary | ICD-10-CM | POA: Diagnosis not present

## 2023-04-03 DIAGNOSIS — N185 Chronic kidney disease, stage 5: Secondary | ICD-10-CM | POA: Diagnosis not present

## 2023-04-03 DIAGNOSIS — I5020 Unspecified systolic (congestive) heart failure: Secondary | ICD-10-CM | POA: Diagnosis not present

## 2023-04-09 DIAGNOSIS — J410 Simple chronic bronchitis: Secondary | ICD-10-CM | POA: Diagnosis not present

## 2023-04-09 DIAGNOSIS — I5020 Unspecified systolic (congestive) heart failure: Secondary | ICD-10-CM | POA: Diagnosis not present

## 2023-04-09 DIAGNOSIS — Z950 Presence of cardiac pacemaker: Secondary | ICD-10-CM | POA: Diagnosis not present

## 2023-04-09 DIAGNOSIS — E7800 Pure hypercholesterolemia, unspecified: Secondary | ICD-10-CM | POA: Diagnosis not present

## 2023-04-09 DIAGNOSIS — I132 Hypertensive heart and chronic kidney disease with heart failure and with stage 5 chronic kidney disease, or end stage renal disease: Secondary | ICD-10-CM | POA: Diagnosis not present

## 2023-04-09 DIAGNOSIS — E559 Vitamin D deficiency, unspecified: Secondary | ICD-10-CM | POA: Diagnosis not present

## 2023-04-09 DIAGNOSIS — N185 Chronic kidney disease, stage 5: Secondary | ICD-10-CM | POA: Diagnosis not present

## 2023-04-09 DIAGNOSIS — Z556 Problems related to health literacy: Secondary | ICD-10-CM | POA: Diagnosis not present

## 2023-04-09 DIAGNOSIS — I2510 Atherosclerotic heart disease of native coronary artery without angina pectoris: Secondary | ICD-10-CM | POA: Diagnosis not present

## 2023-04-09 DIAGNOSIS — K219 Gastro-esophageal reflux disease without esophagitis: Secondary | ICD-10-CM | POA: Diagnosis not present

## 2023-04-09 DIAGNOSIS — Z87891 Personal history of nicotine dependence: Secondary | ICD-10-CM | POA: Diagnosis not present

## 2023-04-09 DIAGNOSIS — G309 Alzheimer's disease, unspecified: Secondary | ICD-10-CM | POA: Diagnosis not present

## 2023-04-09 DIAGNOSIS — Z86010 Personal history of colonic polyps: Secondary | ICD-10-CM | POA: Diagnosis not present

## 2023-04-09 DIAGNOSIS — Z7982 Long term (current) use of aspirin: Secondary | ICD-10-CM | POA: Diagnosis not present

## 2023-04-09 DIAGNOSIS — M1611 Unilateral primary osteoarthritis, right hip: Secondary | ICD-10-CM | POA: Diagnosis not present

## 2023-04-09 DIAGNOSIS — J309 Allergic rhinitis, unspecified: Secondary | ICD-10-CM | POA: Diagnosis not present

## 2023-04-18 ENCOUNTER — Ambulatory Visit (INDEPENDENT_AMBULATORY_CARE_PROVIDER_SITE_OTHER): Payer: Medicare Other | Admitting: Podiatry

## 2023-04-18 DIAGNOSIS — M79674 Pain in right toe(s): Secondary | ICD-10-CM

## 2023-04-18 DIAGNOSIS — M79675 Pain in left toe(s): Secondary | ICD-10-CM | POA: Diagnosis not present

## 2023-04-18 DIAGNOSIS — B351 Tinea unguium: Secondary | ICD-10-CM | POA: Diagnosis not present

## 2023-04-21 NOTE — Progress Notes (Signed)
  Subjective:  Patient ID: Betty [name], female    DOB: 1946-02-28,  MRN: [id number]  Chief Complaint  Patient presents with   Nail Problem    Patient came in today for routine foot care, nail trim, nails are thick and dark     77 y.o. female presents with the above complaint. History confirmed with patient.  Previous debridement was helpful in reducing pain and discomfort and she would like to continue with this plan. No other new issues  Objective:  Physical Exam: warm, good capillary refill, no trophic changes or ulcerative lesions, normal DP and PT pulses, and normal sensory exam. Left Foot: dystrophic yellowed discolored nail plates with subungual debris Right Foot: dystrophic yellowed discolored nail plates with subungual debris   Assessment:   1. Pain due to onychomycosis of toenails of both feet      Plan:  Patient was evaluated and treated and all questions answered.  Discussed the etiology and treatment options for the condition in detail with the patient.  Intermittent debridement has been helpful. Recommended debridement of the nails today. Sharp and mechanical debridement performed of all painful and mycotic nails today. Nails debrided in length and thickness using a nail nipper to level of comfort. Discussed treatment options including appropriate shoe gear. Follow up as needed for painful nails.   No follow-ups on file.

## 2023-05-01 NOTE — Progress Notes (Unsigned)
Cardiology Office Note:   Date:  05/06/2023  ID:  [name], DOB Sep 03, 1946, MRN [id number]  History of Present Illness:   Betty [name] is a 77 y.o. female with history of CAD with pRCA 90% (small, nondominant) recommended for medical management, chronic systolic HF with improved EF, NICM s/p CRT-P. CKD IV, LBBB, history of COVID complicated by ARDs, dementia, HTN and HLD who was previously followed by Dr. Katrinka Blazing who now presents to clinic for follow-up.  Was last seen in 06/2022 where she was doing well from a CV standpoint.  Today, the patient overall feels well. No chest pain, SOB, LE edema, orthopnea, PND, or palpitations. Able to walk without exertional symptoms. Tolerating medications as prescribed. No orthostatic symptoms.  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)    Acute mastitis of right breast 10/2003   Arthritis    bursitis in right shoulder (05/21/2018)   Blood transfusion without reported diagnosis    Cerebral aneurysm, nonruptured 07/16/2018   Right anterior communicating artery   CKD (chronic kidney disease), stage IV (HCC)    Colon polyps    COPD (chronic obstructive pulmonary disease) (HCC)    emphysema   Dementia (HCC) 07/16/2018   Family history of breast cancer    GERD (gastroesophageal reflux disease)    Hypercholesterolemia    Hypertension    Non-small cell carcinoma of right lung, stage 1 (HCC) 05/17/2015   Stage IB, right upper lobectomy 04/29/15, chemo    NSTEMI (non-ST elevated myocardial infarction) (HCC) 03/24/2018   Presence of permanent cardiac pacemaker 05/21/2018   Pure hypercholesterolemia    Sciatica of right side    Seasonal allergic rhinitis    Tobacco dependence    Tubular adenoma of colon    Vitamin D deficiency      ROS: As per HPI  Studies Reviewed:    EKG:  A-sensed, v-paced rhythm  Cardiac Studies & Procedures   CARDIAC CATHETERIZATION  CARDIAC CATHETERIZATION  04/24/2018  Narrative Images from the original result were not included.   Prox RCA lesion is 90% stenosed.  Betty [name] is a 77 y.o. female   [id number] LOCATION:  FACILITY: [facility] PHYSICIAN: Nanetta Batty, M.D. 1946-10-01   DATE OF PROCEDURE:  04/24/2018  DATE OF DISCHARGE:     CARDIAC CATHETERIZATION    History obtained from chart review.77 y.o. female with a hx of CKD stage III, essential HTN, stage Ib lung cancer (s/p chemotherapy and surgical intervention), HLD, COPD, dementia and recent dx of severe chronic systolic heart failure with EF of 15% per echocardiogram by PCP. She had a recent hospitalization for acute CHF and NSTEMI with aborted cath (04/01/18) secondary to acute confusion and agitation however with presumed CAD. Came to ED with one episode of chest pain shortness of breath, little hypoxic on presentation requiring 2 L of oxygen.  She was seen by Dr. Clifton James who referred her for right and left heart cath to define her anatomy and physiology.  Should be it should be known that her kidney function was CK D3.  She has a known EF of 15%.  By prior 2D echocardiogram.  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  Continue medical therapy for nonischemic cardia myopathy will be recommended.  The sheaths were removed and pressure held on the groin to achieve hemostasis.  The patient left the lab  in stable condition. Nanetta Batty. MD, [facility] 04/24/2018 6:08 PM  Findings Coronary Findings Diagnostic  Dominance: Left  Right Coronary Artery Prox RCA lesion is 90% stenosed.  Intervention  No interventions have been documented.   STRESS TESTS  NM MYOCAR MULTI W/SPECT W 03/25/2018  Narrative  There was no ST segment deviation noted during stress.  Findings consistent with ischemia.  This is a high risk study.  The left ventricular ejection fraction is  severely decreased (<30%).  Severe LVE with diffuse hypokinesis inferior basal dyskinesis EF 17% Poor quality study with stress images having diffusely less counts than rest Suggestion of septal apical distal anterior wall and inferior wall ischemia   ECHOCARDIOGRAM  ECHOCARDIOGRAM COMPLETE 11/14/2022  Narrative ECHOCARDIOGRAM REPORT    Patient Name:   Betty [name]  Date of Exam: 11/14/2022 Medical Rec #:  [id number]     Height:       64.0 in Accession #:    [id number]    Weight:       135.0 lb Date of Birth:  August 04, 1946      BSA:          1.655 m Patient Age:    22 years      BP:           128/60 mmHg Patient Gender: F             HR:           66 bpm. Exam Location:  [address]  Procedure: 2D Echo, Cardiac Doppler and Color Doppler  Indications:    I50.20 CHF (HFrEF)  History:        Patient has prior history of Echocardiogram examinations, most recent 02/08/2021. CAD and Previous Myocardial Infarction, COPD, Arrythmias:LBBB; Risk Factors:Family History of Coronary Artery Disease, Hypertension, Dyslipidemia and Former Smoker. Lung Cancer status post Right Lower Lobectomy, Heart Failure with Reduced EF (25-30% Prior).  Sonographer:    Farrel Conners RDCS Referring Phys: Barry Dienes [facility]  IMPRESSIONS   1. Left ventricular ejection fraction, by estimation, is 30 to 35%. The left ventricle has moderately decreased function. The left ventricle demonstrates global hypokinesis. There is mild left ventricular hypertrophy. Left ventricular diastolic parameters are consistent with Grade I diastolic dysfunction (impaired relaxation). 2. Right ventricular systolic function is low normal. The right ventricular size is normal. There is mildly elevated pulmonary artery systolic pressure. The estimated right ventricular systolic pressure is 37.3 mmHg. 3. Left atrial size was mildly dilated. 4. The mitral valve is abnormal. Trivial mitral valve regurgitation. 5. The aortic valve is  tricuspid. Aortic valve regurgitation is not visualized. 6. The inferior vena cava is normal in size with greater than 50% respiratory variability, suggesting right atrial pressure of 3 mmHg.  Comparison(s): Changes from prior study are noted. 02/08/2021: LVEF 25-30%.  FINDINGS Left Ventricle: Left ventricular ejection fraction, by estimation, is 30 to 35%. The left ventricle has moderately decreased function. The left ventricle demonstrates global hypokinesis. The left ventricular internal cavity size was normal in size. There is mild left ventricular hypertrophy. Abnormal (paradoxical) septal motion, consistent with RV pacemaker. Left ventricular diastolic parameters are consistent with Grade I diastolic dysfunction (impaired relaxation). Indeterminate filling pressures.  Right Ventricle: The right ventricular size is normal. No increase in right ventricular wall thickness. Right ventricular systolic function is low normal. There is mildly elevated pulmonary artery systolic pressure. The tricuspid regurgitant velocity is 2.93 m/s, and with an assumed right atrial pressure of 3 mmHg, the estimated right ventricular  systolic pressure is 37.3 mmHg.  Left Atrium: Left atrial size was mildly dilated.  Right Atrium: Right atrial size was normal in size.  Pericardium: There is no evidence of pericardial effusion.  Mitral Valve: The mitral valve is abnormal. There is moderate thickening of the posterior and anterior mitral valve leaflet(s). There is mild calcification of the mitral valve leaflet(s). Trivial mitral valve regurgitation.  Tricuspid Valve: The tricuspid valve is grossly normal. Tricuspid valve regurgitation is mild.  Aortic Valve: The aortic valve is tricuspid. Aortic valve regurgitation is not visualized.  Pulmonic Valve: The pulmonic valve was grossly normal. Pulmonic valve regurgitation is trivial.  Aorta: The aortic root and ascending aorta are structurally normal, with no  evidence of dilitation.  Venous: The inferior vena cava is normal in size with greater than 50% respiratory variability, suggesting right atrial pressure of 3 mmHg.  IAS/Shunts: No atrial level shunt detected by color flow Doppler.  Additional Comments: A device lead is visualized.   LEFT VENTRICLE PLAX 2D LVIDd:         4.10 cm   Diastology LVIDs:         3.03 cm   LV e' medial:    3.83 cm/s LV PW:         1.17 cm   LV E/e' medial:  16.5 LV IVS:        0.97 cm   LV e' lateral:   5.13 cm/s LVOT diam:     2.20 cm   LV E/e' lateral: 12.3 LV SV:         63 LV SV Index:   38 LVOT Area:     3.80 cm   RIGHT VENTRICLE RV Basal diam:  3.50 cm RV S prime:     10.90 cm/s TAPSE (M-mode): 1.7 cm  LEFT ATRIUM             Index        RIGHT ATRIUM           Index LA diam:        3.25 cm 1.96 cm/m   RA Area:     18.60 cm LA Vol (A2C):   63.2 ml 38.18 ml/m  RA Volume:   52.90 ml  31.96 ml/m LA Vol (A4C):   52.3 ml 31.59 ml/m LA Biplane Vol: 62.5 ml 37.75 ml/m AORTIC VALVE LVOT Vmax:   88.00 cm/s LVOT Vmean:  54.800 cm/s LVOT VTI:    0.164 m  AORTA Ao Root diam: 3.00 cm Ao Asc diam:  3.50 cm  MITRAL VALVE                TRICUSPID VALVE MV Area (PHT): cm          TR Peak grad:   34.3 mmHg MV Decel Time: 173 msec     TR Vmax:        293.00 cm/s MV E velocity: 63.05 cm/s MV A velocity: 108.00 cm/s  SHUNTS MV E/A ratio:  0.58         Systemic VTI:  0.16 m Systemic Diam: 2.20 cm  Zoila Shutter MD Electronically signed by Zoila Shutter MD Signature Date/Time: 11/14/2022/7:48:04 PM    Final              Risk Assessment/Calculations:              Physical Exam:   VS:  BP 108/62   Pulse 64   Ht 5\' 4"  (1.626 m)   Wt 123 lb (  55.8 kg)   SpO2 97%   BMI 21.11 kg/m    Wt Readings from Last 3 Encounters:  05/06/23 123 lb (55.8 kg)  07/04/22 135 lb (61.2 kg)  06/30/22 135 lb (61.2 kg)     GEN: Well nourished, well developed in no acute distress NECK: No JVD; No  carotid bruits CARDIAC: RRR, no murmurs, rubs, gallops RESPIRATORY:  Clear to auscultation without rales, wheezing or rhonchi  ABDOMEN: Soft, non-tender, non-distended EXTREMITIES:  No edema; No deformity   ASSESSMENT AND PLAN:   #Chronic Systolic HF: #NICM: -TTE 11/2022 with LVEF 30-35% with global hypokinesis, G1DD, trivial MR -Currently compensated and euvolemic on exam -Continue metop 50mg  XL daily -Continue isordil 20mg  BID -Continue hydralazine 50mg  TID -GDMT limited due to CKD IV  #CAD: -pRCA 90% but small and nondominant and was recommended for medical management -Continue ASA 81mg  daily -Continue lipitor 40mg  daily -Continue metop 50mg  XL daily -Continue imdur 20mg  TID -nitroglycerin prn  #HLD: -Continue lipitor 40mg  daily  #CKD IV: -Follows with Nephrology -Cr  ~2.6-3        Signed, Meriam Sprague, MD

## 2023-05-02 DIAGNOSIS — Z556 Problems related to health literacy: Secondary | ICD-10-CM | POA: Diagnosis not present

## 2023-05-02 DIAGNOSIS — N185 Chronic kidney disease, stage 5: Secondary | ICD-10-CM | POA: Diagnosis not present

## 2023-05-02 DIAGNOSIS — Z950 Presence of cardiac pacemaker: Secondary | ICD-10-CM | POA: Diagnosis not present

## 2023-05-02 DIAGNOSIS — E559 Vitamin D deficiency, unspecified: Secondary | ICD-10-CM | POA: Diagnosis not present

## 2023-05-02 DIAGNOSIS — J410 Simple chronic bronchitis: Secondary | ICD-10-CM | POA: Diagnosis not present

## 2023-05-02 DIAGNOSIS — Z7982 Long term (current) use of aspirin: Secondary | ICD-10-CM | POA: Diagnosis not present

## 2023-05-02 DIAGNOSIS — E7800 Pure hypercholesterolemia, unspecified: Secondary | ICD-10-CM | POA: Diagnosis not present

## 2023-05-02 DIAGNOSIS — K219 Gastro-esophageal reflux disease without esophagitis: Secondary | ICD-10-CM | POA: Diagnosis not present

## 2023-05-02 DIAGNOSIS — I2510 Atherosclerotic heart disease of native coronary artery without angina pectoris: Secondary | ICD-10-CM | POA: Diagnosis not present

## 2023-05-02 DIAGNOSIS — I132 Hypertensive heart and chronic kidney disease with heart failure and with stage 5 chronic kidney disease, or end stage renal disease: Secondary | ICD-10-CM | POA: Diagnosis not present

## 2023-05-02 DIAGNOSIS — G309 Alzheimer's disease, unspecified: Secondary | ICD-10-CM | POA: Diagnosis not present

## 2023-05-02 DIAGNOSIS — M1611 Unilateral primary osteoarthritis, right hip: Secondary | ICD-10-CM | POA: Diagnosis not present

## 2023-05-02 DIAGNOSIS — Z86010 Personal history of colonic polyps: Secondary | ICD-10-CM | POA: Diagnosis not present

## 2023-05-02 DIAGNOSIS — I5020 Unspecified systolic (congestive) heart failure: Secondary | ICD-10-CM | POA: Diagnosis not present

## 2023-05-02 DIAGNOSIS — J309 Allergic rhinitis, unspecified: Secondary | ICD-10-CM | POA: Diagnosis not present

## 2023-05-02 DIAGNOSIS — Z87891 Personal history of nicotine dependence: Secondary | ICD-10-CM | POA: Diagnosis not present

## 2023-05-06 ENCOUNTER — Encounter: Payer: Self-pay | Admitting: Cardiology

## 2023-05-06 ENCOUNTER — Ambulatory Visit: Payer: Medicare Other | Attending: Internal Medicine | Admitting: Cardiology

## 2023-05-06 VITALS — BP 108/62 | HR 64 | Ht 64.0 in | Wt 123.0 lb

## 2023-05-06 DIAGNOSIS — I428 Other cardiomyopathies: Secondary | ICD-10-CM

## 2023-05-06 DIAGNOSIS — I10 Essential (primary) hypertension: Secondary | ICD-10-CM

## 2023-05-06 DIAGNOSIS — I2510 Atherosclerotic heart disease of native coronary artery without angina pectoris: Secondary | ICD-10-CM | POA: Diagnosis not present

## 2023-05-06 DIAGNOSIS — N184 Chronic kidney disease, stage 4 (severe): Secondary | ICD-10-CM | POA: Diagnosis not present

## 2023-05-06 DIAGNOSIS — I5022 Chronic systolic (congestive) heart failure: Secondary | ICD-10-CM | POA: Diagnosis not present

## 2023-05-06 DIAGNOSIS — I5020 Unspecified systolic (congestive) heart failure: Secondary | ICD-10-CM | POA: Diagnosis not present

## 2023-05-06 DIAGNOSIS — Z950 Presence of cardiac pacemaker: Secondary | ICD-10-CM | POA: Diagnosis not present

## 2023-05-06 NOTE — Patient Instructions (Signed)
Medication Instructions:  Your physician recommends that you continue on your current medications as directed. Please refer to the Current Medication list given to you today.  *If you need a refill on your cardiac medications before your next appointment, please call your pharmacy*   Follow-Up: At [facility], you and your health needs are our priority.  As part of our continuing mission to provide you with exceptional heart care, we have created designated Provider Care Teams.  These Care Teams include your primary Cardiologist (physician) and Advanced Practice Providers (APPs -  Physician Assistants and Nurse Practitioners) who all work together to provide you with the care you need, when you need it.   Your next appointment:   6 month(s)  Provider:   Dr. Eldridge Dace

## 2023-05-07 ENCOUNTER — Ambulatory Visit (INDEPENDENT_AMBULATORY_CARE_PROVIDER_SITE_OTHER): Payer: Medicare Other

## 2023-05-07 DIAGNOSIS — I428 Other cardiomyopathies: Secondary | ICD-10-CM

## 2023-05-07 LAB — CUP PACEART REMOTE DEVICE CHECK
Battery Remaining Longevity: 21 mo
Battery Voltage: 2.87 V
Brady Statistic AP VP Percent: 24.38 %
Brady Statistic AP VS Percent: 0.54 %
Brady Statistic AS VP Percent: 72.81 %
Brady Statistic AS VS Percent: 2.27 %
Brady Statistic RA Percent Paced: 25.8 %
Brady Statistic RV Percent Paced: 0.13 %
Date Time Interrogation Session: 20240701213911
Implantable Lead Connection Status: 753985
Implantable Lead Connection Status: 753985
Implantable Lead Connection Status: 753985
Implantable Lead Implant Date: 20190717
Implantable Lead Implant Date: 20190717
Implantable Lead Implant Date: 20190717
Implantable Lead Location: 753858
Implantable Lead Location: 753859
Implantable Lead Location: 753860
Implantable Lead Model: 5076
Implantable Lead Model: 5076
Implantable Pulse Generator Implant Date: 20190717
Lead Channel Impedance Value: 1121 Ohm
Lead Channel Impedance Value: 361 Ohm
Lead Channel Impedance Value: 399 Ohm
Lead Channel Impedance Value: 418 Ohm
Lead Channel Impedance Value: 437 Ohm
Lead Channel Impedance Value: 494 Ohm
Lead Channel Impedance Value: 494 Ohm
Lead Channel Impedance Value: 589 Ohm
Lead Channel Impedance Value: 665 Ohm
Lead Channel Impedance Value: 760 Ohm
Lead Channel Impedance Value: 893 Ohm
Lead Channel Impedance Value: 950 Ohm
Lead Channel Impedance Value: 969 Ohm
Lead Channel Impedance Value: 988 Ohm
Lead Channel Pacing Threshold Amplitude: 0.5 V
Lead Channel Pacing Threshold Amplitude: 0.625 V
Lead Channel Pacing Threshold Amplitude: 3.25 V
Lead Channel Pacing Threshold Pulse Width: 0.4 ms
Lead Channel Pacing Threshold Pulse Width: 0.4 ms
Lead Channel Pacing Threshold Pulse Width: 0.6 ms
Lead Channel Sensing Intrinsic Amplitude: 2.125 mV
Lead Channel Sensing Intrinsic Amplitude: 2.125 mV
Lead Channel Sensing Intrinsic Amplitude: 8.375 mV
Lead Channel Sensing Intrinsic Amplitude: 8.375 mV
Lead Channel Setting Pacing Amplitude: 1.5 V
Lead Channel Setting Pacing Amplitude: 2 V
Lead Channel Setting Pacing Amplitude: 4.25 V
Lead Channel Setting Pacing Pulse Width: 0.4 ms
Lead Channel Setting Pacing Pulse Width: 0.6 ms
Lead Channel Setting Sensing Sensitivity: 1.2 mV
Zone Setting Status: 755011
Zone Setting Status: 755011

## 2023-05-31 NOTE — Progress Notes (Signed)
Remote pacemaker transmission.   

## 2023-06-03 ENCOUNTER — Inpatient Hospital Stay (HOSPITAL_COMMUNITY)
Admission: EM | Admit: 2023-06-03 | Discharge: 2023-06-06 | DRG: 371 | Disposition: A | Discharge status: Home Health | Payer: Medicare Other | Attending: Internal Medicine | Admitting: Internal Medicine

## 2023-06-03 ENCOUNTER — Encounter (HOSPITAL_COMMUNITY): Payer: Self-pay | Admitting: Emergency Medicine

## 2023-06-03 ENCOUNTER — Other Ambulatory Visit: Payer: Self-pay

## 2023-06-03 ENCOUNTER — Emergency Department (HOSPITAL_COMMUNITY): Payer: Medicare Other

## 2023-06-03 DIAGNOSIS — I5022 Chronic systolic (congestive) heart failure: Secondary | ICD-10-CM | POA: Diagnosis not present

## 2023-06-03 DIAGNOSIS — Z87891 Personal history of nicotine dependence: Secondary | ICD-10-CM

## 2023-06-03 DIAGNOSIS — A419 Sepsis, unspecified organism: Secondary | ICD-10-CM | POA: Diagnosis not present

## 2023-06-03 DIAGNOSIS — R1010 Upper abdominal pain, unspecified: Secondary | ICD-10-CM | POA: Diagnosis not present

## 2023-06-03 DIAGNOSIS — Z8249 Family history of ischemic heart disease and other diseases of the circulatory system: Secondary | ICD-10-CM

## 2023-06-03 DIAGNOSIS — K219 Gastro-esophageal reflux disease without esophagitis: Secondary | ICD-10-CM | POA: Diagnosis present

## 2023-06-03 DIAGNOSIS — N179 Acute kidney failure, unspecified: Secondary | ICD-10-CM | POA: Diagnosis present

## 2023-06-03 DIAGNOSIS — Z1152 Encounter for screening for COVID-19: Secondary | ICD-10-CM | POA: Diagnosis not present

## 2023-06-03 DIAGNOSIS — F0390 Unspecified dementia without behavioral disturbance: Secondary | ICD-10-CM | POA: Diagnosis present

## 2023-06-03 DIAGNOSIS — Z0389 Encounter for observation for other suspected diseases and conditions ruled out: Secondary | ICD-10-CM | POA: Diagnosis not present

## 2023-06-03 DIAGNOSIS — N184 Chronic kidney disease, stage 4 (severe): Secondary | ICD-10-CM | POA: Diagnosis present

## 2023-06-03 DIAGNOSIS — Z9221 Personal history of antineoplastic chemotherapy: Secondary | ICD-10-CM | POA: Diagnosis not present

## 2023-06-03 DIAGNOSIS — G9341 Metabolic encephalopathy: Secondary | ICD-10-CM | POA: Diagnosis not present

## 2023-06-03 DIAGNOSIS — R1900 Intra-abdominal and pelvic swelling, mass and lump, unspecified site: Secondary | ICD-10-CM | POA: Diagnosis present

## 2023-06-03 DIAGNOSIS — I671 Cerebral aneurysm, nonruptured: Secondary | ICD-10-CM | POA: Diagnosis present

## 2023-06-03 DIAGNOSIS — I959 Hypotension, unspecified: Secondary | ICD-10-CM | POA: Diagnosis present

## 2023-06-03 DIAGNOSIS — R109 Unspecified abdominal pain: Secondary | ICD-10-CM | POA: Diagnosis present

## 2023-06-03 DIAGNOSIS — I130 Hypertensive heart and chronic kidney disease with heart failure and stage 1 through stage 4 chronic kidney disease, or unspecified chronic kidney disease: Secondary | ICD-10-CM | POA: Diagnosis not present

## 2023-06-03 DIAGNOSIS — Z85118 Personal history of other malignant neoplasm of bronchus and lung: Secondary | ICD-10-CM

## 2023-06-03 DIAGNOSIS — I2510 Atherosclerotic heart disease of native coronary artery without angina pectoris: Secondary | ICD-10-CM | POA: Diagnosis present

## 2023-06-03 DIAGNOSIS — I252 Old myocardial infarction: Secondary | ICD-10-CM | POA: Diagnosis not present

## 2023-06-03 DIAGNOSIS — K3533 Acute appendicitis with perforation and localized peritonitis, with abscess: Principal | ICD-10-CM | POA: Diagnosis present

## 2023-06-03 DIAGNOSIS — K5730 Diverticulosis of large intestine without perforation or abscess without bleeding: Secondary | ICD-10-CM | POA: Diagnosis not present

## 2023-06-03 DIAGNOSIS — D259 Leiomyoma of uterus, unspecified: Secondary | ICD-10-CM | POA: Diagnosis present

## 2023-06-03 DIAGNOSIS — M7551 Bursitis of right shoulder: Secondary | ICD-10-CM | POA: Diagnosis present

## 2023-06-03 DIAGNOSIS — Z7982 Long term (current) use of aspirin: Secondary | ICD-10-CM

## 2023-06-03 DIAGNOSIS — N281 Cyst of kidney, acquired: Secondary | ICD-10-CM | POA: Diagnosis not present

## 2023-06-03 DIAGNOSIS — Z86010 Personal history of colonic polyps: Secondary | ICD-10-CM

## 2023-06-03 DIAGNOSIS — J439 Emphysema, unspecified: Secondary | ICD-10-CM | POA: Diagnosis not present

## 2023-06-03 DIAGNOSIS — Z950 Presence of cardiac pacemaker: Secondary | ICD-10-CM

## 2023-06-03 DIAGNOSIS — Z79899 Other long term (current) drug therapy: Secondary | ICD-10-CM

## 2023-06-03 DIAGNOSIS — K8020 Calculus of gallbladder without cholecystitis without obstruction: Secondary | ICD-10-CM | POA: Diagnosis not present

## 2023-06-03 DIAGNOSIS — Z902 Acquired absence of lung [part of]: Secondary | ICD-10-CM

## 2023-06-03 DIAGNOSIS — I10 Essential (primary) hypertension: Secondary | ICD-10-CM | POA: Diagnosis present

## 2023-06-03 DIAGNOSIS — R32 Unspecified urinary incontinence: Secondary | ICD-10-CM | POA: Diagnosis present

## 2023-06-03 DIAGNOSIS — E7800 Pure hypercholesterolemia, unspecified: Secondary | ICD-10-CM | POA: Diagnosis present

## 2023-06-03 DIAGNOSIS — E559 Vitamin D deficiency, unspecified: Secondary | ICD-10-CM | POA: Diagnosis present

## 2023-06-03 DIAGNOSIS — R4182 Altered mental status, unspecified: Secondary | ICD-10-CM | POA: Diagnosis not present

## 2023-06-03 DIAGNOSIS — Z882 Allergy status to sulfonamides status: Secondary | ICD-10-CM

## 2023-06-03 DIAGNOSIS — E872 Acidosis, unspecified: Secondary | ICD-10-CM | POA: Diagnosis present

## 2023-06-03 DIAGNOSIS — M5431 Sciatica, right side: Secondary | ICD-10-CM | POA: Diagnosis present

## 2023-06-03 LAB — CBC WITH DIFFERENTIAL/PLATELET
Abs Immature Granulocytes: 0.1 10*3/uL — ABNORMAL HIGH (ref 0.00–0.07)
Basophils Absolute: 0.1 10*3/uL (ref 0.0–0.1)
Basophils Relative: 0 %
Eosinophils Absolute: 0 10*3/uL (ref 0.0–0.5)
Eosinophils Relative: 0 %
HCT: 32 % — ABNORMAL LOW (ref 36.0–46.0)
Hemoglobin: 10.2 g/dL — ABNORMAL LOW (ref 12.0–15.0)
Immature Granulocytes: 1 %
Lymphocytes Relative: 17 %
Lymphs Abs: 2.7 10*3/uL (ref 0.7–4.0)
MCH: 30.1 pg (ref 26.0–34.0)
MCHC: 31.9 g/dL (ref 30.0–36.0)
MCV: 94.4 fL (ref 80.0–100.0)
Monocytes Absolute: 0.9 10*3/uL (ref 0.1–1.0)
Monocytes Relative: 6 %
Neutro Abs: 11.5 10*3/uL — ABNORMAL HIGH (ref 1.7–7.7)
Neutrophils Relative %: 76 %
Platelets: 296 10*3/uL (ref 150–400)
RBC: 3.39 MIL/uL — ABNORMAL LOW (ref 3.87–5.11)
RDW: 12.6 % (ref 11.5–15.5)
WBC: 15.3 10*3/uL — ABNORMAL HIGH (ref 4.0–10.5)
nRBC: 0 % (ref 0.0–0.2)

## 2023-06-03 LAB — URINALYSIS, W/ REFLEX TO CULTURE (INFECTION SUSPECTED)
Bacteria, UA: NONE SEEN
Bilirubin Urine: NEGATIVE
Glucose, UA: NEGATIVE mg/dL
Hgb urine dipstick: NEGATIVE
Ketones, ur: NEGATIVE mg/dL
Nitrite: NEGATIVE
Protein, ur: 100 mg/dL — AB
Specific Gravity, Urine: 1.01 (ref 1.005–1.030)
pH: 6 (ref 5.0–8.0)

## 2023-06-03 LAB — APTT: aPTT: 24 seconds (ref 24–36)

## 2023-06-03 LAB — PROTIME-INR
INR: 1.2 (ref 0.8–1.2)
Prothrombin Time: 15.1 seconds (ref 11.4–15.2)

## 2023-06-03 LAB — I-STAT CG4 LACTIC ACID, ED: Lactic Acid, Venous: 3.7 mmol/L (ref 0.5–1.9)

## 2023-06-03 MED ORDER — ACETAMINOPHEN 325 MG PO TABS
650.0000 mg | ORAL_TABLET | Freq: Once | ORAL | Status: AC
  Administered 2023-06-04: 650 mg via ORAL
  Filled 2023-06-03: qty 2

## 2023-06-03 MED ORDER — LACTATED RINGERS IV BOLUS (SEPSIS)
1000.0000 mL | Freq: Once | INTRAVENOUS | Status: AC
  Administered 2023-06-03: 1000 mL via INTRAVENOUS

## 2023-06-03 MED ORDER — LACTATED RINGERS IV SOLN: INTRAVENOUS | Status: DC

## 2023-06-03 MED ORDER — LACTATED RINGERS IV BOLUS (SEPSIS)
1000.0000 mL | Freq: Once | INTRAVENOUS | Status: AC
  Administered 2023-06-04: 1000 mL via INTRAVENOUS

## 2023-06-03 MED ORDER — SODIUM CHLORIDE 0.9 % IV SOLN
2.0000 g | INTRAVENOUS | Status: DC
  Filled 2023-06-03: qty 20

## 2023-06-03 NOTE — ED Provider Notes (Signed)
[place] EMERGENCY DEPARTMENT AT [facility] Provider Note   CSN: [id number] Arrival date & time: 06/03/23  2255     History {Add pertinent medical, surgical, social history, OB history to HPI:1} Chief Complaint  Patient presents with   Loss of Consciousness   Weakness    Betty [name] is a 77 y.o. female.  Patient presents to the emergency department for evaluation of weakness.  Patient has not been doing well all day.  She is presenting to the emergency department with her son, her caregiver.  She does have a history of dementia.  Patient has been very confused and agitated today.  She has been incontinent of urine which is unusual.  In triage, patient had a syncopal episode and was found to be hypotensive.       Home Medications Prior to Admission medications   Medication Sig Start Date End Date Taking? Authorizing Provider  acetaminophen (TYLENOL) 325 MG tablet Take 650 mg by mouth every 6 (six) hours as needed for mild pain or moderate pain.    [provider]  aspirin EC 81 MG tablet Take 81 mg by mouth at bedtime.    [provider]  atorvastatin (LIPITOR) 40 MG tablet TAKE 1 TABLET BY MOUTH EVERY DAY AT 6 PM 07/23/22   Lyn Records, MD  Cholecalciferol (VITAMIN D) 2000 units tablet Take 1,000 Units by mouth at bedtime.     [provider]  donepezil (ARICEPT) 10 MG tablet Take 1 tablet (10 mg total) by mouth at bedtime. 12/14/21   Glean Salvo, NP  hydrALAZINE (APRESOLINE) 50 MG tablet TAKE 1 TABLET(50 MG) BY MOUTH THREE TIMES DAILY 07/13/22   Lyn Records, MD  isosorbide dinitrate (ISORDIL) 20 MG tablet TAKE 1 TABLET BY MOUTH THREE TIMES DAILY 08/08/22   Lyn Records, MD  memantine (NAMENDA) 10 MG tablet Take 1 tablet (10 mg total) by mouth 2 (two) times daily. 06/28/22   Glean Salvo, NP  metoprolol succinate (TOPROL-XL) 50 MG 24 hr tablet TAKE 1 TABLET(50 MG) BY MOUTH DAILY WITH OR IMMEDIATELY FOLLOWING A MEAL 09/12/22   Lyn Records, MD  Multiple Vitamin (MULTIVITAMIN WITH MINERALS) TABS tablet Take 0.5 tablets by mouth at bedtime.    [provider]  nitroGLYCERIN (NITROSTAT) 0.4 MG SL tablet Place 1 tablet (0.4 mg total) under the tongue every 5 (five) minutes x 3 doses as needed for chest pain. 04/26/18   Berton Bon, NP  omeprazole (PRILOSEC) 40 MG capsule Take 40 mg by mouth daily.    [provider]  sodium bicarbonate 650 MG tablet Take 1 tablet (650 mg total) by mouth 2 (two) times daily. 07/02/19   Marguerita Merles Latif, DO      Allergies    Sulfa antibiotics    Review of Systems   Review of Systems  Physical Exam Updated Vital Signs BP (!) 79/43 (BP Location: Left Arm)   Pulse 86   Temp 97.6 F (36.4 C) (Oral)   Resp 18   Ht 5\' 4"  (1.626 m)   Wt 55.8 kg   SpO2 95%   BMI 21.12 kg/m  Physical Exam Vitals and nursing note reviewed.  Constitutional:      General: She is not in acute distress.    Appearance: She is well-developed.  HENT:     Head: Normocephalic and atraumatic.     Mouth/Throat:     Mouth: Mucous membranes are moist.  Eyes:  General: Vision grossly intact. Gaze aligned appropriately.     Extraocular Movements: Extraocular movements intact.     Conjunctiva/sclera: Conjunctivae normal.  Cardiovascular:     Rate and Rhythm: Normal rate and regular rhythm.     Pulses: Normal pulses.     Heart sounds: Normal heart sounds, S1 normal and S2 normal. No murmur heard.    No friction rub. No gallop.  Pulmonary:     Effort: Pulmonary effort is normal. No respiratory distress.     Breath sounds: Normal breath sounds.  Abdominal:     General: Bowel sounds are normal.     Palpations: Abdomen is soft.     Tenderness: There is no abdominal tenderness. There is no guarding or rebound.     Hernia: No hernia is present.  Musculoskeletal:        General: No swelling.     Cervical back: Full passive range of motion without pain, normal range of motion and neck  supple. No spinous process tenderness or muscular tenderness. Normal range of motion.     Right lower leg: No edema.     Left lower leg: No edema.  Skin:    General: Skin is warm and dry.     Capillary Refill: Capillary refill takes less than 2 seconds.     Findings: No ecchymosis, erythema, rash or wound.  Neurological:     General: No focal deficit present.     Mental Status: She is alert. Mental status is at baseline. She is confused.     GCS: GCS eye subscore is 4. GCS verbal subscore is 5. GCS motor subscore is 6.     Cranial Nerves: Cranial nerves 2-12 are intact.     Sensory: Sensation is intact.     Motor: Motor function is intact.     Coordination: Coordination is intact.  Psychiatric:        Attention and Perception: Attention normal.        Mood and Affect: Mood normal.        Speech: Speech normal.        Behavior: Behavior normal.     ED Results / Procedures / Treatments   Labs (all labs ordered are listed, but only abnormal results are displayed) Labs Reviewed  CULTURE, BLOOD (ROUTINE X 2)  CULTURE, BLOOD (ROUTINE X 2)  COMPREHENSIVE METABOLIC PANEL  CBC WITH DIFFERENTIAL/PLATELET  PROTIME-INR  APTT  URINALYSIS, W/ REFLEX TO CULTURE (INFECTION SUSPECTED)  I-STAT CG4 LACTIC ACID, ED  TROPONIN I (HIGH SENSITIVITY)    EKG None  Radiology No results found.  Procedures Procedures  {Document cardiac monitor, telemetry assessment procedure when appropriate:1}  Medications Ordered in ED Medications  lactated ringers bolus 1,000 mL (has no administration in time range)    ED Course/ Medical Decision Making/ A&P   {   Click here for ABCD2, HEART and other calculatorsREFRESH Note before signing :1}                          Medical Decision Making Amount and/or Complexity of Data Reviewed Labs: ordered. Radiology: ordered. ECG/medicine tests: ordered.   ***  {Document critical care time when appropriate:1} {Document review of labs and clinical  decision tools ie heart score, Chads2Vasc2 etc:1}  {Document your independent review of radiology images, and any outside records:1} {Document your discussion with family members, caretakers, and with consultants:1} {Document social determinants of health affecting pt's care:1} {Document your decision making why or why  not admission, treatments were needed:1} Final Clinical Impression(s) / ED Diagnoses Final diagnoses:  None    Rx / DC Orders ED Discharge Orders     None

## 2023-06-03 NOTE — ED Notes (Signed)
Unsuccessful IV attempt, phlebotomy to attempt for blood cultures.

## 2023-06-03 NOTE — ED Triage Notes (Signed)
Pt here POV per son pt is incontinent of urine and unable to walk with weakness starting today. Pt had syncopal episode upon checking in. Hx of dementia per son.

## 2023-06-04 ENCOUNTER — Encounter (HOSPITAL_COMMUNITY): Payer: Self-pay | Admitting: Internal Medicine

## 2023-06-04 ENCOUNTER — Emergency Department (HOSPITAL_COMMUNITY): Payer: Medicare Other

## 2023-06-04 DIAGNOSIS — G9341 Metabolic encephalopathy: Secondary | ICD-10-CM | POA: Diagnosis present

## 2023-06-04 DIAGNOSIS — Z950 Presence of cardiac pacemaker: Secondary | ICD-10-CM | POA: Diagnosis not present

## 2023-06-04 DIAGNOSIS — D259 Leiomyoma of uterus, unspecified: Secondary | ICD-10-CM | POA: Diagnosis present

## 2023-06-04 DIAGNOSIS — N281 Cyst of kidney, acquired: Secondary | ICD-10-CM | POA: Diagnosis not present

## 2023-06-04 DIAGNOSIS — I2510 Atherosclerotic heart disease of native coronary artery without angina pectoris: Secondary | ICD-10-CM | POA: Diagnosis present

## 2023-06-04 DIAGNOSIS — F0390 Unspecified dementia without behavioral disturbance: Secondary | ICD-10-CM | POA: Diagnosis present

## 2023-06-04 DIAGNOSIS — R509 Fever, unspecified: Secondary | ICD-10-CM | POA: Diagnosis not present

## 2023-06-04 DIAGNOSIS — E872 Acidosis, unspecified: Secondary | ICD-10-CM | POA: Diagnosis present

## 2023-06-04 DIAGNOSIS — Z1152 Encounter for screening for COVID-19: Secondary | ICD-10-CM | POA: Diagnosis not present

## 2023-06-04 DIAGNOSIS — K8020 Calculus of gallbladder without cholecystitis without obstruction: Secondary | ICD-10-CM | POA: Diagnosis not present

## 2023-06-04 DIAGNOSIS — K3533 Acute appendicitis with perforation and localized peritonitis, with abscess: Secondary | ICD-10-CM | POA: Diagnosis present

## 2023-06-04 DIAGNOSIS — J309 Allergic rhinitis, unspecified: Secondary | ICD-10-CM | POA: Diagnosis not present

## 2023-06-04 DIAGNOSIS — I671 Cerebral aneurysm, nonruptured: Secondary | ICD-10-CM | POA: Diagnosis present

## 2023-06-04 DIAGNOSIS — K219 Gastro-esophageal reflux disease without esophagitis: Secondary | ICD-10-CM | POA: Diagnosis present

## 2023-06-04 DIAGNOSIS — I959 Hypotension, unspecified: Secondary | ICD-10-CM | POA: Diagnosis present

## 2023-06-04 DIAGNOSIS — R109 Unspecified abdominal pain: Secondary | ICD-10-CM | POA: Diagnosis not present

## 2023-06-04 DIAGNOSIS — Z8249 Family history of ischemic heart disease and other diseases of the circulatory system: Secondary | ICD-10-CM | POA: Diagnosis not present

## 2023-06-04 DIAGNOSIS — N179 Acute kidney failure, unspecified: Secondary | ICD-10-CM | POA: Diagnosis present

## 2023-06-04 DIAGNOSIS — Z9221 Personal history of antineoplastic chemotherapy: Secondary | ICD-10-CM | POA: Diagnosis not present

## 2023-06-04 DIAGNOSIS — N184 Chronic kidney disease, stage 4 (severe): Secondary | ICD-10-CM | POA: Diagnosis not present

## 2023-06-04 DIAGNOSIS — K5730 Diverticulosis of large intestine without perforation or abscess without bleeding: Secondary | ICD-10-CM | POA: Diagnosis not present

## 2023-06-04 DIAGNOSIS — R1900 Intra-abdominal and pelvic swelling, mass and lump, unspecified site: Secondary | ICD-10-CM | POA: Diagnosis not present

## 2023-06-04 DIAGNOSIS — E7800 Pure hypercholesterolemia, unspecified: Secondary | ICD-10-CM | POA: Diagnosis present

## 2023-06-04 DIAGNOSIS — Z87891 Personal history of nicotine dependence: Secondary | ICD-10-CM | POA: Diagnosis not present

## 2023-06-04 DIAGNOSIS — R1909 Other intra-abdominal and pelvic swelling, mass and lump: Secondary | ICD-10-CM | POA: Diagnosis not present

## 2023-06-04 DIAGNOSIS — I10 Essential (primary) hypertension: Secondary | ICD-10-CM | POA: Diagnosis not present

## 2023-06-04 DIAGNOSIS — R4182 Altered mental status, unspecified: Secondary | ICD-10-CM | POA: Diagnosis present

## 2023-06-04 DIAGNOSIS — I5022 Chronic systolic (congestive) heart failure: Secondary | ICD-10-CM | POA: Diagnosis not present

## 2023-06-04 DIAGNOSIS — I252 Old myocardial infarction: Secondary | ICD-10-CM | POA: Diagnosis not present

## 2023-06-04 DIAGNOSIS — R1010 Upper abdominal pain, unspecified: Secondary | ICD-10-CM | POA: Diagnosis not present

## 2023-06-04 DIAGNOSIS — Z902 Acquired absence of lung [part of]: Secondary | ICD-10-CM | POA: Diagnosis not present

## 2023-06-04 DIAGNOSIS — Z85118 Personal history of other malignant neoplasm of bronchus and lung: Secondary | ICD-10-CM | POA: Diagnosis not present

## 2023-06-04 DIAGNOSIS — J439 Emphysema, unspecified: Secondary | ICD-10-CM | POA: Diagnosis present

## 2023-06-04 DIAGNOSIS — I130 Hypertensive heart and chronic kidney disease with heart failure and stage 1 through stage 4 chronic kidney disease, or unspecified chronic kidney disease: Secondary | ICD-10-CM | POA: Diagnosis present

## 2023-06-04 LAB — CBC WITH DIFFERENTIAL/PLATELET
Abs Immature Granulocytes: 0.08 10*3/uL — ABNORMAL HIGH (ref 0.00–0.07)
Basophils Absolute: 0 10*3/uL (ref 0.0–0.1)
Basophils Relative: 0 %
Eosinophils Absolute: 0 10*3/uL (ref 0.0–0.5)
Eosinophils Relative: 0 %
HCT: 26.5 % — ABNORMAL LOW (ref 36.0–46.0)
Hemoglobin: 8.7 g/dL — ABNORMAL LOW (ref 12.0–15.0)
Immature Granulocytes: 1 %
Lymphocytes Relative: 8 %
Lymphs Abs: 1.2 10*3/uL (ref 0.7–4.0)
MCH: 30.2 pg (ref 26.0–34.0)
MCHC: 32.8 g/dL (ref 30.0–36.0)
MCV: 92 fL (ref 80.0–100.0)
Monocytes Absolute: 0.7 10*3/uL (ref 0.1–1.0)
Monocytes Relative: 5 %
Neutro Abs: 13 10*3/uL — ABNORMAL HIGH (ref 1.7–7.7)
Neutrophils Relative %: 86 %
Platelets: 235 10*3/uL (ref 150–400)
RBC: 2.88 MIL/uL — ABNORMAL LOW (ref 3.87–5.11)
RDW: 12.4 % (ref 11.5–15.5)
WBC: 14.9 10*3/uL — ABNORMAL HIGH (ref 4.0–10.5)
nRBC: 0 % (ref 0.0–0.2)

## 2023-06-04 LAB — COMPREHENSIVE METABOLIC PANEL
ALT: 13 U/L (ref 0–44)
AST: 15 U/L (ref 15–41)
Albumin: 2.6 g/dL — ABNORMAL LOW (ref 3.5–5.0)
Alkaline Phosphatase: 73 U/L (ref 38–126)
Anion gap: 10 (ref 5–15)
BUN: 27 mg/dL — ABNORMAL HIGH (ref 8–23)
CO2: 22 mmol/L (ref 22–32)
Calcium: 8.4 mg/dL — ABNORMAL LOW (ref 8.9–10.3)
Chloride: 108 mmol/L (ref 98–111)
Creatinine, Ser: 3.08 mg/dL — ABNORMAL HIGH (ref 0.44–1.00)
GFR, Estimated: 15 mL/min — ABNORMAL LOW (ref >60)
Glucose, Bld: 122 mg/dL — ABNORMAL HIGH (ref 70–99)
Potassium: 3.9 mmol/L (ref 3.5–5.1)
Sodium: 140 mmol/L (ref 135–145)
Total Bilirubin: 0.5 mg/dL (ref 0.3–1.2)
Total Protein: 5.7 g/dL — ABNORMAL LOW (ref 6.5–8.1)

## 2023-06-04 LAB — LACTIC ACID, PLASMA: Lactic Acid, Venous: 0.7 mmol/L (ref 0.5–1.9)

## 2023-06-04 LAB — I-STAT CG4 LACTIC ACID, ED: Lactic Acid, Venous: 2.1 mmol/L (ref 0.5–1.9)

## 2023-06-04 LAB — MAGNESIUM: Magnesium: 1.7 mg/dL (ref 1.7–2.4)

## 2023-06-04 LAB — TROPONIN I (HIGH SENSITIVITY): Troponin I (High Sensitivity): 16 ng/L (ref <18)

## 2023-06-04 LAB — PROTIME-INR
INR: 1.2 (ref 0.8–1.2)
Prothrombin Time: 15.4 seconds — ABNORMAL HIGH (ref 11.4–15.2)

## 2023-06-04 MED ORDER — CALCIUM CARBONATE ANTACID 1250 MG/5ML PO SUSP: 500.0000 mg | Freq: Four times a day (QID) | ORAL | Status: DC | PRN

## 2023-06-04 MED ORDER — ATORVASTATIN CALCIUM 40 MG PO TABS
40.0000 mg | ORAL_TABLET | Freq: Every day | ORAL | Status: DC
  Administered 2023-06-04 – 2023-06-05 (×2): 40 mg via ORAL
  Filled 2023-06-04 (×2): qty 1

## 2023-06-04 MED ORDER — LACTATED RINGERS IV SOLN: INTRAVENOUS | Status: DC

## 2023-06-04 MED ORDER — SODIUM CHLORIDE 0.9 % IV SOLN: 1.0000 g | INTRAVENOUS | Status: DC

## 2023-06-04 MED ORDER — SODIUM CHLORIDE 0.9% FLUSH
3.0000 mL | Freq: Two times a day (BID) | INTRAVENOUS | Status: DC
  Administered 2023-06-04 – 2023-06-06 (×5): 3 mL via INTRAVENOUS

## 2023-06-04 MED ORDER — ACETAMINOPHEN 325 MG PO TABS: 650.0000 mg | ORAL_TABLET | Freq: Four times a day (QID) | ORAL | Status: DC | PRN

## 2023-06-04 MED ORDER — NEPRO/CARBSTEADY PO LIQD: 237.0000 mL | Freq: Three times a day (TID) | ORAL | Status: DC | PRN

## 2023-06-04 MED ORDER — HEPARIN SODIUM (PORCINE) 5000 UNIT/ML IJ SOLN
5000.0000 [IU] | Freq: Three times a day (TID) | INTRAMUSCULAR | Status: DC
  Administered 2023-06-04 – 2023-06-05 (×3): 5000 [IU] via SUBCUTANEOUS
  Filled 2023-06-04 (×4): qty 1

## 2023-06-04 MED ORDER — HYDROXYZINE HCL 25 MG PO TABS: 25.0000 mg | ORAL_TABLET | Freq: Three times a day (TID) | ORAL | Status: DC | PRN

## 2023-06-04 MED ORDER — METRONIDAZOLE 500 MG/100ML IV SOLN
500.0000 mg | Freq: Once | INTRAVENOUS | Status: AC
  Administered 2023-06-04: 500 mg via INTRAVENOUS
  Filled 2023-06-04: qty 100

## 2023-06-04 MED ORDER — ISOSORBIDE DINITRATE 10 MG PO TABS
20.0000 mg | ORAL_TABLET | Freq: Three times a day (TID) | ORAL | Status: DC
  Administered 2023-06-04 – 2023-06-06 (×7): 20 mg via ORAL
  Filled 2023-06-04 (×7): qty 2

## 2023-06-04 MED ORDER — ONDANSETRON HCL 4 MG/2ML IJ SOLN: 4.0000 mg | Freq: Four times a day (QID) | INTRAMUSCULAR | Status: DC | PRN

## 2023-06-04 MED ORDER — SODIUM CHLORIDE 0.9 % IV SOLN
2.0000 g | INTRAVENOUS | Status: DC
  Administered 2023-06-04 – 2023-06-05 (×2): 2 g via INTRAVENOUS
  Filled 2023-06-04 (×2): qty 20

## 2023-06-04 MED ORDER — SODIUM BICARBONATE 650 MG PO TABS
650.0000 mg | ORAL_TABLET | Freq: Two times a day (BID) | ORAL | Status: DC
  Administered 2023-06-04 – 2023-06-06 (×5): 650 mg via ORAL
  Filled 2023-06-04 (×5): qty 1

## 2023-06-04 MED ORDER — ASPIRIN 81 MG PO TBEC
81.0000 mg | DELAYED_RELEASE_TABLET | Freq: Every day | ORAL | Status: DC
  Administered 2023-06-04 – 2023-06-05 (×2): 81 mg via ORAL
  Filled 2023-06-04 (×2): qty 1

## 2023-06-04 MED ORDER — QUETIAPINE FUMARATE 25 MG PO TABS
25.0000 mg | ORAL_TABLET | Freq: Every day | ORAL | Status: DC
  Administered 2023-06-04 – 2023-06-05 (×2): 25 mg via ORAL
  Filled 2023-06-04 (×2): qty 1

## 2023-06-04 MED ORDER — SORBITOL 70 % SOLN: 30.0000 mL | Status: DC | PRN

## 2023-06-04 MED ORDER — FENTANYL CITRATE PF 50 MCG/ML IJ SOSY: 12.5000 ug | PREFILLED_SYRINGE | INTRAMUSCULAR | Status: DC | PRN

## 2023-06-04 MED ORDER — DOCUSATE SODIUM 283 MG RE ENEM: 1.0000 | ENEMA | RECTAL | Status: DC | PRN

## 2023-06-04 MED ORDER — HYDRALAZINE HCL 20 MG/ML IJ SOLN: 5.0000 mg | INTRAMUSCULAR | Status: DC | PRN

## 2023-06-04 MED ORDER — FEBUXOSTAT 40 MG PO TABS
40.0000 mg | ORAL_TABLET | Freq: Every day | ORAL | Status: DC
  Administered 2023-06-04 – 2023-06-05 (×2): 40 mg via ORAL
  Filled 2023-06-04 (×3): qty 1

## 2023-06-04 MED ORDER — DOCUSATE SODIUM 100 MG PO CAPS
100.0000 mg | ORAL_CAPSULE | Freq: Two times a day (BID) | ORAL | Status: DC
  Administered 2023-06-04 – 2023-06-06 (×5): 100 mg via ORAL
  Filled 2023-06-04 (×5): qty 1

## 2023-06-04 MED ORDER — ACETAMINOPHEN 650 MG RE SUPP: 650.0000 mg | Freq: Four times a day (QID) | RECTAL | Status: DC | PRN

## 2023-06-04 MED ORDER — VANCOMYCIN HCL IN DEXTROSE 1-5 GM/200ML-% IV SOLN
1000.0000 mg | Freq: Once | INTRAVENOUS | Status: AC
  Administered 2023-06-04: 1000 mg via INTRAVENOUS
  Filled 2023-06-04: qty 200

## 2023-06-04 MED ORDER — BISACODYL 5 MG PO TBEC: 5.0000 mg | DELAYED_RELEASE_TABLET | Freq: Every day | ORAL | Status: DC | PRN

## 2023-06-04 MED ORDER — PANTOPRAZOLE SODIUM 40 MG PO TBEC
40.0000 mg | DELAYED_RELEASE_TABLET | Freq: Every morning | ORAL | Status: DC
  Administered 2023-06-04 – 2023-06-06 (×3): 40 mg via ORAL
  Filled 2023-06-04 (×3): qty 1

## 2023-06-04 MED ORDER — METRONIDAZOLE 500 MG/100ML IV SOLN
500.0000 mg | Freq: Two times a day (BID) | INTRAVENOUS | Status: DC
  Administered 2023-06-04 – 2023-06-06 (×5): 500 mg via INTRAVENOUS
  Filled 2023-06-04 (×5): qty 100

## 2023-06-04 MED ORDER — POLYETHYLENE GLYCOL 3350 17 G PO PACK: 17.0000 g | PACK | Freq: Every day | ORAL | Status: DC | PRN

## 2023-06-04 MED ORDER — LORATADINE 10 MG PO TABS
10.0000 mg | ORAL_TABLET | Freq: Every day | ORAL | Status: DC
  Administered 2023-06-04 – 2023-06-06 (×3): 10 mg via ORAL
  Filled 2023-06-04 (×3): qty 1

## 2023-06-04 MED ORDER — CAMPHOR-MENTHOL 0.5-0.5 % EX LOTN: 1.0000 | TOPICAL_LOTION | Freq: Three times a day (TID) | CUTANEOUS | Status: DC | PRN

## 2023-06-04 MED ORDER — DONEPEZIL HCL 10 MG PO TABS
10.0000 mg | ORAL_TABLET | Freq: Every day | ORAL | Status: DC
  Administered 2023-06-04 – 2023-06-05 (×2): 10 mg via ORAL
  Filled 2023-06-04 (×2): qty 1

## 2023-06-04 MED ORDER — NALOXONE HCL 0.4 MG/ML IJ SOLN: 0.4000 mg | INTRAMUSCULAR | Status: DC | PRN

## 2023-06-04 MED ORDER — SODIUM CHLORIDE 0.9 % IV SOLN
2.0000 g | Freq: Once | INTRAVENOUS | Status: AC
  Administered 2023-06-04: 2 g via INTRAVENOUS
  Filled 2023-06-04: qty 12.5

## 2023-06-04 MED ORDER — ONDANSETRON HCL 4 MG PO TABS: 4.0000 mg | ORAL_TABLET | Freq: Four times a day (QID) | ORAL | Status: DC | PRN

## 2023-06-04 MED ORDER — MEMANTINE HCL 10 MG PO TABS
10.0000 mg | ORAL_TABLET | Freq: Two times a day (BID) | ORAL | Status: DC
  Administered 2023-06-04 – 2023-06-06 (×5): 10 mg via ORAL
  Filled 2023-06-04 (×5): qty 1

## 2023-06-04 NOTE — ED Notes (Signed)
Pt continually pulling leads and IV off. Pt ambulated to the bathroom with minimal assist

## 2023-06-04 NOTE — ED Notes (Signed)
Son would like admitting team to call him when they round

## 2023-06-04 NOTE — H&P (Addendum)
History and Physical    Patient: Betty [name] ZOX:[id number] DOB: November 07, 1945 DOA: 06/03/2023 DOS: the patient was seen and examined on 06/04/2023 PCP: Collene Mares, PA  Patient coming from: Home - lives with son?; NOK:  Ayzel, [place], [phone]   Chief Complaint: AMS  HPI: Betty [name] is a 77 y.o. female with medical history significant of HFrEF, stage 4 CKD, COPD, dementia, HTN, HLD, stage 1 NSCLC, CAD, pacemaker, and HLD who presented with  worsening confusion.  The patient is oriented only to person, feels confident that she is at work, and told me that I am "likely to be a pain in the butt."  No complaints at the time of my evaluation.  I called to speak with her son without success.  Surgery is also planning to call him this AM.     ER Course:  Carryover, per Dr. Arlean Hopping:   Son reports confused relative to her baseline dementia x 1 day with urinary incontinence in the floor (unusual).  Had syncopal event at the ER with SBP 60s, T 100.6.  WBC 15k.  Lactate 3.7 -> 2.1.  Unremarkable UA, negative CXR.  +RLQ pain, US/CY equivocal for appy vs. Ovarian mass.  Surgery to consult.  Labs with AKI on stage 3b CKD.  COVID negative.  Blood cultures pending.      Review of Systems: unable to review all systems due to the inability of the patient to answer questions. 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)    Arthritis    bursitis in right shoulder (05/21/2018)   Blood transfusion without reported diagnosis    Cerebral aneurysm, nonruptured 07/16/2018   Right anterior communicating artery   CKD (chronic kidney disease), stage IV (HCC)    Colon polyps    COPD (chronic obstructive pulmonary disease) (HCC)    emphysema   Dementia (HCC) 07/16/2018   GERD (gastroesophageal reflux disease)    Hypercholesterolemia    Hypertension    Non-small cell carcinoma of right lung, stage 1 (HCC) 05/17/2015   Stage IB, right  upper lobectomy 04/29/15, chemo    NSTEMI (non-ST elevated myocardial infarction) (HCC) 03/24/2018   Presence of permanent cardiac pacemaker 05/21/2018   Pure hypercholesterolemia    Sciatica of right side    Seasonal allergic rhinitis    Tobacco dependence    Tubular adenoma of colon    Vitamin D deficiency    Past Surgical History:  Procedure Laterality Date   BIOPSY  06/26/2019   Procedure: BIOPSY;  Surgeon: Charlott Rakes, MD;  Location: [facility] ENDOSCOPY;  Service: Endoscopy;;   BIV PACEMAKER INSERTION CRT-P N/A 05/21/2018   Procedure: BIV PACEMAKER INSERTION CRT-P;  Surgeon: Duke Salvia, MD;  Location: [facility] INVASIVE CV LAB;  Service: Cardiovascular;  Laterality: N/A;   BREAST SURGERY     small mass removed from left breast--benign   CRYO INTERCOSTAL NERVE BLOCK Right 04/29/2015   Procedure: CRYO INTERCOSTAL NERVE BLOCK;  Surgeon: Loreli Slot, MD;  Location: [facility] OR;  Service: Thoracic;  Laterality: Right;   ESOPHAGOGASTRODUODENOSCOPY (EGD) WITH PROPOFOL N/A 06/26/2019   Procedure: ESOPHAGOGASTRODUODENOSCOPY (EGD) WITH PROPOFOL;  Surgeon: Charlott Rakes, MD;  Location: [facility] ENDOSCOPY;  Service: Endoscopy;  Laterality: N/A;   INSERT / REPLACE / REMOVE PACEMAKER  05/21/2018   LOBECTOMY Right 04/29/2015   Procedure: RIGHT LOWER LUNG LOBECTOMY ;  Surgeon: Loreli Slot, MD;  Location: [facility] OR;  Service: Thoracic;  Laterality: Right;  LYMPH NODE DISSECTION Right 04/29/2015   Procedure: RIGHT LUNG LYMPH NODE DISSECTION;  Surgeon: Loreli Slot, MD;  Location[facility] OR;  Service: Thoracic;  Laterality: Right;   RIGHT/LEFT HEART CATH AND CORONARY ANGIOGRAPHY N/A 04/24/2018   Procedure: RIGHT/LEFT HEART CATH AND CORONARY ANGIOGRAPHY;  Surgeon: Runell Gess, MD;  Location: [facility] INVASIVE CV LAB;  Service: Cardiovascular;  Laterality: N/A;   VAGINAL DELIVERY     52 yrs ago   VIDEO ASSISTED THORACOSCOPY (VATS)/ LOBECTOMY Right 04/29/2015   Procedure: RIGHT VIDEO ASSISTED  THORACOSCOPY (VATS) WEDGE RESECTION/ RIGHT LOWER LOBECTOMY, CRYO-ANALGESIA OF INTERCOSTAL NERVES;  Surgeon: Loreli Slot, MD;  Location: [facility] OR;  Service: Thoracic;  Laterality: Right;   Social History:  reports that she quit smoking about 8 years ago. Her smoking use included cigarettes. She started smoking about 55 years ago. She has a 35.3 pack-year smoking history. She has been exposed to tobacco smoke. She has never used smokeless tobacco. She reports current alcohol use. She reports that she does not use drugs.  Allergies  Allergen Reactions   Sulfa Antibiotics Itching    Family History  Problem Relation Age of Onset   Cancer Father    Cancer Sister    Heart attack Brother    Dementia Brother    Other Son        bicuspid aortic valve   Heart attack Brother    Cancer Sister     Prior to Admission medications   Medication Sig Start Date End Date Taking? Authorizing Provider  febuxostat (ULORIC) 40 MG tablet Take 40 mg by mouth at bedtime. 03/27/23  Yes [provider]  pantoprazole (PROTONIX) 40 MG tablet Take 40 mg by mouth every morning. 03/14/23  Yes [provider]  QUEtiapine (SEROQUEL) 25 MG tablet Take 25 mg by mouth at bedtime. 05/28/23  Yes [provider]  acetaminophen (TYLENOL) 325 MG tablet Take 650 mg by mouth every 6 (six) hours as needed for mild pain or moderate pain.    [provider]  aspirin EC 81 MG tablet Take 81 mg by mouth at bedtime.    [provider]  atorvastatin (LIPITOR) 40 MG tablet TAKE 1 TABLET BY MOUTH EVERY DAY AT 6 PM 07/23/22   Lyn Records, MD  Cholecalciferol (VITAMIN D) 2000 units tablet Take 1,000 Units by mouth at bedtime.     [provider]  donepezil (ARICEPT) 10 MG tablet Take 1 tablet (10 mg total) by mouth at bedtime. 12/14/21   Glean Salvo, NP  hydrALAZINE (APRESOLINE) 50 MG tablet TAKE 1 TABLET(50 MG) BY MOUTH THREE TIMES DAILY 07/13/22   Lyn Records, MD  isosorbide  dinitrate (ISORDIL) 20 MG tablet TAKE 1 TABLET BY MOUTH THREE TIMES DAILY 08/08/22   Lyn Records, MD  memantine (NAMENDA) 10 MG tablet Take 1 tablet (10 mg total) by mouth 2 (two) times daily. 06/28/22   Glean Salvo, NP  metoprolol succinate (TOPROL-XL) 50 MG 24 hr tablet TAKE 1 TABLET(50 MG) BY MOUTH DAILY WITH OR IMMEDIATELY FOLLOWING A MEAL 09/12/22   Lyn Records, MD  Multiple Vitamin (MULTIVITAMIN WITH MINERALS) TABS tablet Take 0.5 tablets by mouth at bedtime.    [provider]  nitroGLYCERIN (NITROSTAT) 0.4 MG SL tablet Place 1 tablet (0.4 mg total) under the tongue every 5 (five) minutes x 3 doses as needed for chest pain. 04/26/18   Berton Bon, NP  omeprazole (PRILOSEC) 40 MG capsule Take 40 mg by  mouth daily.    [provider]  sodium bicarbonate 650 MG tablet Take 1 tablet (650 mg total) by mouth 2 (two) times daily. 07/02/19   Merlene Laughter, DO    Physical Exam: Vitals:   06/04/23 1600 06/04/23 1630 06/04/23 1700 06/04/23 1835  BP: (!) 162/75 (!) 167/67 (!) 159/75 (!) 149/62  Pulse:  72 69 73  Resp: 19 16 16  (!) 23  Temp:   97.9 F (36.6 C) 98.5 F (36.9 C)  TempSrc:   Oral Oral  SpO2:   92% 96%  Weight:      Height:       General:  Appears calm and comfortable and is in NAD Eyes:  EOMI, normal lids, iris ENT:  grossly normal hearing, lips & tongue, mmm Neck:  no LAD, masses or thyromegaly Cardiovascular:  RRR, no m/r/g. No LE edema.  Respiratory:   CTA bilaterally with no wheezes/rales/rhonchi.  Normal respiratory effort. Abdomen:  soft, NT, ND Skin:  no rash or induration seen on limited exam Musculoskeletal:  grossly normal tone BUE/BLE, good ROM, no bony abnormality Psychiatric:  confused mood and affect, speech sparse and mildly inappropriate, AOx1 Neurologic:  CN 2-12 grossly intact, moves all extremities in coordinated fashion   Radiological Exams on Admission: Independently reviewed - see discussion in A/P where  applicable  US Pelvis Complete  Result Date: 06/04/2023 CLINICAL DATA:  Pelvic mass seen on CT. EXAM: TRANSABDOMINAL ULTRASOUND OF PELVIS TECHNIQUE: Transabdominal ultrasound examination of the pelvis was performed including evaluation of the uterus, ovaries, adnexal regions, and pelvic cul-de-sac. COMPARISON:  06/04/2023. FINDINGS: Uterus Measurements: 6.5 x 3.6 x 4.8 cm = volume: 59.1 mL. Multiple fibroids are present in the uterus. Endometrium Thickness: Not seen due to fibroids. Right ovary Not visualized. A right adnexal hypoechoic mass with color flow is noted measuring 3.6 x 4.0 x 4.0 cm. Left ovary Not visualized. Other findings:  Trace amount of free fluid in the right adnexa IMPRESSION: 1. Hypoechoic mass with internal vascularity in the right adnexa with indeterminate imaging features. MRI is recommended for further evaluation. 2. Uterine fibroids. 3. The ovaries are not visualized on exam. Electronically Signed   By: Thornell Sartorius M.D.   On: 06/04/2023 03:03   CT ABDOMEN PELVIS WO CONTRAST  Result Date: 06/04/2023 CLINICAL DATA:  Right lower quadrant pain EXAM: CT ABDOMEN AND PELVIS WITHOUT CONTRAST TECHNIQUE: Multidetector CT imaging of the abdomen and pelvis was performed following the standard protocol without IV contrast. RADIATION DOSE REDUCTION: This exam was performed according to the departmental dose-optimization program which includes automated exposure control, adjustment of the mA and/or kV according to patient size and/or use of iterative reconstruction technique. COMPARISON:  Chest CT 08/29/2020 FINDINGS: Lower chest: Lung bases demonstrate postsurgical changes on the right. Cardiomegaly with partially visualized cardiac pacing leads. Hepatobiliary: Gallstones. No focal hepatic abnormality or biliary dilatation. Hiatal hernia. Pancreas: Unremarkable. No pancreatic ductal dilatation or surrounding inflammatory changes. Spleen: Normal in size without focal abnormality.  Adrenals/Urinary Tract: Adrenal glands are within normal limits. Simple and complex cysts within the bilateral kidneys, incompletely visualized without contrast. No convincing hydronephrosis. Slightly dense appearing right renal collecting system, series 3, image 17. Irregular exophytic lesion superior pole left kidney measuring 21 by 16 mm on series 3, image 17. Urinary bladder is unremarkable. Small right-sided kidney stones Stomach/Bowel: The stomach is nonenlarged. There is no dilated small bowel. 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 Multiple small diverticula at the terminal ileum, coronal series  6 image 50 through 54. Tubular structure measuring up to 11 mm on coronal images, series 6 image 41 through 49, and axial series 3 image 53-55. Surrounding fat stranding. This is inseparable from a low-density structure in the right pelvis measuring 4.3 x 3.1 cm on series 3, image 55. Vascular/Lymphatic: Advanced aortic atherosclerosis. No aneurysm. No suspicious lymph nodes. Reproductive: Calcified and noncalcified uterine masses consistent with fibroids. Right adnexal ovoid structure as described above. Possible small amount of right adnexal fluid Other: Negative for free air. Musculoskeletal: No acute or suspicious osseous abnormality. IMPRESSION: 1. Tubular structure in the right lower quadrant with surrounding fat stranding, inseparable from an oval low-density structure in the right pelvis/adnexa measuring 4.3 x 3.1 cm. Findings could be secondary to appendicitis with possible abscess at the tip of the appendix and extending into the right adnexa. Other considerations could include appendiceal mass complicated by inflammation/infection versus ovarian neoplasm. Follow-up examination with enteral and IV contrast would be helpful. Pelvic ultrasound could also be considered for further assessment. 2. 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 3.  Cholelithiasis. 4. Simple and complex cysts within the bilateral kidneys with additional indeterminate irregular exophytic lesion off the upper pole left kidney, incompletely characterized without contrast. Slightly dense appearing right renal collecting system. Consider further assessment with dedicated renal protocol CT or MRI. 5. Uterine fibroids. 6. Aortic atherosclerosis. Aortic Atherosclerosis (ICD10-I70.0). Electronically Signed   By: Jasmine Pang M.D.   On: 06/04/2023 01:21   DG Chest Port 1 View  Result Date: 06/03/2023 CLINICAL DATA:  Questionable sepsis. EXAM: PORTABLE CHEST 1 VIEW COMPARISON:  Chest radiograph dated 03/30/2021. FINDINGS: No focal consolidation, pleural effusion, or pneumothorax. The cardiac silhouette is within normal limits. Left pectoral pacemaker device. No acute osseous pathology. IMPRESSION: No active disease. Electronically Signed   By: Elgie Collard M.D.   On: 06/03/2023 23:52    EKG: Independently reviewed.  Paced rhythm with rate 82; prolonged QTc 567; LBBB   Labs on Admission: I have personally reviewed the available labs and imaging studies at the time of the admission.  Pertinent labs:    Glucose 122 BUN 27/Creatinine 3.08/GFR 15; 31/3.92/11 on 7/29 HS troponin 16 Lactate 3.7, 2.1, 0.7 WBC 14.9; 15.3 on 7/29 Hgb 8.7; 10.2 on 7/29 COVID negative UA: trace LE,100 protein   Assessment and Plan: Principal Problem:   Acute metabolic encephalopathy Active Problems:   CKD (chronic kidney disease), stage IV (HCC)   Chronic systolic heart failure (HCC)   Dementia (HCC)   Essential hypertension   Abdominal pain   Abdominal mass    Assessment and Plan:  Acute metabolic encephalopathy -Patient presenting with encephalopathy as evidenced by her confusion -While the patient does have underlying dementia, this is a change compared to her usual baseline mental status -Unremarkable UA -Negative CXR -Abdominal CT with tubular structure in RLQ,  ?related to appendix vs. Adnexa (also with diverticulosis, cholelithiasis, and renal cysts) -Pelvic US with hyperechoic mass in R adnexa -MRI was recommended and planned but patient's pacer is not compatible -Based on unremarkable evaluation with current ability to protect her airway, will monitor for now in progressive care -PT/OT consults -SLP consult for cognitive/language evaluation -TOC and nutrition consults also requested  Abdominal pain -Not evident on evaluation this AM -Abnormal US/CT with concerning but non-diagnostic findings -Surgery is consulting -MRI ordered but cannot perform -Clear liquids, monitor clinically for now -Continue ceftriaxone/metronidazole  Dementia -Prior h/o delirium with hospitalizations  -Will order delirium precautions -Continue Aricept,  Namenda, Seroquel   Stage IV CKD -Worsened compared to prior baseline -Suspect there is an AKI component superimposed on baseline advanced CKD -Gentle IVF -Avoid nephrotoxic agents -Continue bicarb -Recheck BMP in AM   Chronic systolic CHF -11/2022 echo showed improvement in EF to 30-35%, grade 1 DD  -CRT pacer is in place -Continue ASA, Lipitor, Bidil, Toprol XL -Appears compensated   HTN -Continue Isordil, Toprol -Hold hydralazine for now   NSCLC/COPD -s/p RLL lobectomy -Completed 4 cycles of adjuvant chemotherapy  -Lung cancer appears to remain in remission -No reported meds for this currently  Code status -ACP documents reviewed -Patient would want to be full code based on this information     Advance Care Planning:   Code Status: Full Code   Consults: Surgery; nutrition; PT/OT/SLP; TOC team  DVT Prophylaxis: Heparin  Family Communication: None present; I was unable to reach son by telephone but surgery notified me that they spoke with him  Severity of Illness: The appropriate patient status for this patient is INPATIENT. Inpatient status is judged to be reasonable and necessary in  order to provide the required intensity of service to ensure the patient's safety. The patient's presenting symptoms, physical exam findings, and initial radiographic and laboratory data in the context of their chronic comorbidities is felt to place them at high risk for further clinical deterioration. Furthermore, it is not anticipated that the patient will be medically stable for discharge from the hospital within 2 midnights of admission.   * I certify that at the point of admission it is my clinical judgment that the patient will require inpatient hospital care spanning beyond 2 midnights from the point of admission due to high intensity of service, high risk for further deterioration and high frequency of surveillance required.*  Author: Jonah Blue, MD 06/04/2023 7:19 PM  For on call review [email or web address].

## 2023-06-04 NOTE — Consult Note (Signed)
Consult Note  Betty [name] 01-19-1946  [id number].    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 Patient was syncopal in triage and found to be hypotensive. Low grade fever noted in ED and found to have a lactic acidosis. Noted to have some lower abdominal tenderness on exam in ED. CT done and showed concern for appendicitis vs right adnexal mass. Pelvic US showed hypoechoic mass in the right adnexa with indeterminate imaging features. On exam this AM patient denies abdominal pain. No previous abdominal surgery as far as I can tell. She is not on blood thinners.   ROS: Review of Systems  Unable to perform ROS: Dementia    Family History  Problem Relation Age of Onset   Cancer Father    Cancer Sister    Heart attack Brother    Dementia Brother    Other Son        bicuspid aortic valve   Heart attack Brother    Cancer Sister     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)    Arthritis    bursitis in right shoulder (05/21/2018)   Blood transfusion without reported diagnosis    Cerebral aneurysm, nonruptured 07/16/2018   Right anterior communicating artery   CKD (chronic kidney disease), stage IV (HCC)    Colon polyps    COPD (chronic obstructive pulmonary disease) (HCC)    emphysema   Dementia (HCC) 07/16/2018   GERD (gastroesophageal reflux disease)    Hypercholesterolemia    Hypertension    Non-small cell carcinoma of right lung, stage 1 (HCC) 05/17/2015   Stage IB, right upper lobectomy 04/29/15, chemo    NSTEMI (non-ST elevated myocardial infarction) (HCC)  03/24/2018   Presence of permanent cardiac pacemaker 05/21/2018   Pure hypercholesterolemia    Sciatica of right side    Seasonal allergic rhinitis    Tobacco dependence    Tubular adenoma of colon    Vitamin D deficiency     Past Surgical History:  Procedure Laterality Date   BIOPSY  06/26/2019   Procedure: BIOPSY;  Surgeon: Charlott Rakes, MD;  Location: [facility] ENDOSCOPY;  Service: Endoscopy;;   BIV PACEMAKER INSERTION CRT-P N/A 05/21/2018   Procedure: BIV PACEMAKER INSERTION CRT-P;  Surgeon: Duke Salvia, MD;  Location: [facility] INVASIVE CV LAB;  Service: Cardiovascular;  Laterality: N/A;   BREAST SURGERY     small mass removed from left breast--benign   CRYO INTERCOSTAL NERVE BLOCK Right 04/29/2015   Procedure: CRYO INTERCOSTAL NERVE BLOCK;  Surgeon: Loreli Slot, MD;  Location: [facility] OR;  Service: Thoracic;  Laterality: Right;   ESOPHAGOGASTRODUODENOSCOPY (EGD) WITH PROPOFOL N/A 06/26/2019   Procedure: ESOPHAGOGASTRODUODENOSCOPY (EGD) WITH PROPOFOL;  Surgeon: Charlott Rakes, MD;  Location: [facility] ENDOSCOPY;  Service: Endoscopy;  Laterality: N/A;   INSERT / REPLACE / REMOVE PACEMAKER  05/21/2018   LOBECTOMY Right 04/29/2015   Procedure: RIGHT LOWER LUNG LOBECTOMY ;  Surgeon: Loreli Slot, MD;  Location: [facility] OR;  Service: Thoracic;  Laterality: Right;   LYMPH NODE DISSECTION Right 04/29/2015   Procedure: RIGHT LUNG LYMPH NODE DISSECTION;  Surgeon: Loreli Slot, MD;  Location[facility] OR;  Service: Thoracic;  Laterality: Right;   RIGHT/LEFT HEART CATH AND CORONARY ANGIOGRAPHY N/A 04/24/2018   Procedure: RIGHT/LEFT HEART CATH AND CORONARY ANGIOGRAPHY;  Surgeon: Runell Gess, MD;  Location: [facility] INVASIVE CV LAB;  Service: Cardiovascular;  Laterality: N/A;   VAGINAL DELIVERY     51 yrs ago   VIDEO ASSISTED THORACOSCOPY (VATS)/ LOBECTOMY Right 04/29/2015   Procedure: RIGHT VIDEO ASSISTED THORACOSCOPY (VATS) WEDGE RESECTION/ RIGHT LOWER LOBECTOMY, CRYO-ANALGESIA OF INTERCOSTAL  NERVES;  Surgeon: Loreli Slot, MD;  Location: [facility] OR;  Service: Thoracic;  Laterality: Right;    Social History:  reports that she quit smoking about 8 years ago. Her smoking use included cigarettes. She started smoking about 55 years ago. She has a 35.3 pack-year smoking history. She has been exposed to tobacco smoke. She has never used smokeless tobacco. She reports current alcohol use. She reports that she does not use drugs.  Allergies:  Allergies  Allergen Reactions   Sulfa Antibiotics Itching    (Not in a hospital admission)   Blood pressure 121/75, pulse 76, temperature 97.6 F (36.4 C), temperature source Rectal, resp. rate 18, height 5\' 4"  (1.626 m), weight 55.8 kg, SpO2 100%. Physical Exam:  General: pleasant, WD, elderly female who is laying in bed in NAD HEENT: head is normocephalic, atraumatic.  Sclera are noninjected.  EOMI.  Ears and nose without any masses or lesions.  Mouth is pink and moist Heart: regular, rate, and rhythm.  Normal s1,s2. No obvious murmurs, gallops, or rubs noted.  Palpable radial and pedal pulses bilaterally Lungs: CTAB, no wheezes, rhonchi, or rales noted.  Respiratory effort nonlabored Abd: soft, NT, ND, +BS, no masses, hernias, or organomegaly MS: all 4 extremities are symmetrical with no cyanosis, clubbing, or edema. Skin: warm and dry with no masses, lesions, or rashes Neuro: speech clear, follows command Psych: alert, oriented to self    Results for orders placed or performed during the hospital encounter of 06/03/23 (from the past 48 hour(s))  Comprehensive metabolic panel     Status: Abnormal   Collection Time: 06/03/23 11:17 PM  Result Value Ref Range   Sodium 146 (H) 135 - 145 mmol/L   Potassium 3.9 3.5 - 5.1 mmol/L   Chloride 109 98 - 111 mmol/L   CO2 18 (L) 22 - 32 mmol/L   Glucose, Bld 212 (H) 70 - 99 mg/dL    Comment: Glucose reference range applies only to samples taken after fasting for at least 8 hours.   BUN 31 (H) 8  - 23 mg/dL   Creatinine, Ser 1.61 (H) 0.44 - 1.00 mg/dL   Calcium 9.1 8.9 - 09.6 mg/dL   Total Protein 6.8 6.5 - 8.1 g/dL   Albumin 3.2 (L) 3.5 - 5.0 g/dL   AST 22 15 - 41 U/L   ALT 14 0 - 44 U/L   Alkaline Phosphatase 84 38 - 126 U/L   Total Bilirubin 0.8 0.3 - 1.2 mg/dL   GFR, Estimated 11 (L) >60 mL/min    Comment: (NOTE) Calculated using the CKD-EPI Creatinine Equation (2021)    Anion gap 19 (H) 5 - 15    Comment: Performed at [facility] Lab, [address] [place], [place] [address]  CBC with Differential     Status: Abnormal   Collection Time: 06/03/23 11:17 PM  Result Value Ref Range   WBC 15.3 (H) 4.0 - 10.5 K/uL   RBC 3.39 (L) 3.87 -  5.11 MIL/uL   Hemoglobin 10.2 (L) 12.0 - 15.0 g/dL   HCT 52.8 (L) 41.3 - 24.4 %   MCV 94.4 80.0 - 100.0 fL   MCH 30.1 26.0 - 34.0 pg   MCHC 31.9 30.0 - 36.0 g/dL   RDW 01.0 27.2 - 53.6 %   Platelets 296 150 - 400 K/uL   nRBC 0.0 0.0 - 0.2 %   Neutrophils Relative % 76 %   Neutro Abs 11.5 (H) 1.7 - 7.7 K/uL   Lymphocytes Relative 17 %   Lymphs Abs 2.7 0.7 - 4.0 K/uL   Monocytes Relative 6 %   Monocytes Absolute 0.9 0.1 - 1.0 K/uL   Eosinophils Relative 0 %   Eosinophils Absolute 0.0 0.0 - 0.5 K/uL   Basophils Relative 0 %   Basophils Absolute 0.1 0.0 - 0.1 K/uL   Immature Granulocytes 1 %   Abs Immature Granulocytes 0.10 (H) 0.00 - 0.07 K/uL    Comment: Performed at [facility], [address], [place], [place] [address]  Protime-INR     Status: None   Collection Time: 06/03/23 11:17 PM  Result Value Ref Range   Prothrombin Time 15.1 11.4 - 15.2 seconds   INR 1.2 0.8 - 1.2    Comment: (NOTE) INR goal varies based on device and disease states. Performed at [facility], [address] [place], [place] [address]   APTT     Status: None   Collection Time: 06/03/23 11:17 PM  Result Value Ref Range   aPTT 24 24 - 36 seconds    Comment: Performed at [facility], [address] [place], [place] [address]   Troponin I (High Sensitivity)     Status: None   Collection Time: 06/03/23 11:17 PM  Result Value Ref Range   Troponin I (High Sensitivity) 16 <18 ng/L    Comment: (NOTE) Elevated high sensitivity troponin I (hsTnI) values and significant  changes across serial measurements may suggest ACS but many other  chronic and acute conditions are known to elevate hsTnI results.  Refer to the "Links" section for chest pain algorithms and additional  guidance. Performed at [facility] Lab, [address] [place], [place] [address]   I-Stat Lactic Acid, ED     Status: Abnormal   Collection Time: 06/03/23 11:23 PM  Result Value Ref Range   Lactic Acid, Venous 3.7 (HH) 0.5 - 1.9 mmol/L   Comment NOTIFIED PHYSICIAN   Urinalysis, w/ Reflex to Culture (Infection Suspected) -Urine, Catheterized     Status: Abnormal   Collection Time: 06/03/23 11:33 PM  Result Value Ref Range   Specimen Source URINE, CATHETERIZED    Color, Urine YELLOW YELLOW   APPearance CLEAR CLEAR   Specific Gravity, Urine 1.010 1.005 - 1.030   pH 6.0 5.0 - 8.0   Glucose, UA NEGATIVE NEGATIVE mg/dL   Hgb urine dipstick NEGATIVE NEGATIVE   Bilirubin Urine NEGATIVE NEGATIVE   Ketones, ur NEGATIVE NEGATIVE mg/dL   Protein, ur 329 (A) NEGATIVE mg/dL   Nitrite NEGATIVE NEGATIVE   Leukocytes,Ua TRACE (A) NEGATIVE   RBC / HPF 0-5 0 - 5 RBC/hpf   WBC, UA 6-10 0 - 5 WBC/hpf    Comment:        Reflex urine culture not performed if WBC <=10, OR if Squamous epithelial cells >5. If Squamous epithelial cells >5 suggest recollection.    Bacteria, UA NONE SEEN NONE SEEN   Squamous Epithelial / HPF 0-5  0 - 5 /HPF    Comment: Performed at [facility], [address] [place], [place] [address]  SARS Coronavirus 2 by RT PCR (hospital order, performed in [facility] hospital lab) *cepheid single result test* Anterior Nasal Swab     Status: None   Collection Time: 06/04/23 12:00 AM   Specimen: Anterior Nasal Swab  Result Value  Ref Range   SARS Coronavirus 2 by RT PCR NEGATIVE NEGATIVE    Comment: Performed at [facility] Lab, [address], [place], [place] [address]  Troponin I (High Sensitivity)     Status: None   Collection Time: 06/04/23  1:26 AM  Result Value Ref Range   Troponin I (High Sensitivity) 16 <18 ng/L    Comment: (NOTE) Elevated high sensitivity troponin I (hsTnI) values and significant  changes across serial measurements may suggest ACS but many other  chronic and acute conditions are known to elevate hsTnI results.  Refer to the "Links" section for chest pain algorithms and additional  guidance. Performed at [facility] Lab, [address], [place], [place] [address]   I-Stat Lactic Acid, ED     Status: Abnormal   Collection Time: 06/04/23  2:08 AM  Result Value Ref Range   Lactic Acid, Venous 2.1 (HH) 0.5 - 1.9 mmol/L   Comment NOTIFIED PHYSICIAN   CBC with Differential/Platelet     Status: Abnormal   Collection Time: 06/04/23  4:39 AM  Result Value Ref Range   WBC 14.9 (H) 4.0 - 10.5 K/uL   RBC 2.88 (L) 3.87 - 5.11 MIL/uL   Hemoglobin 8.7 (L) 12.0 - 15.0 g/dL   HCT 28.4 (L) 13.2 - 44.0 %   MCV 92.0 80.0 - 100.0 fL   MCH 30.2 26.0 - 34.0 pg   MCHC 32.8 30.0 - 36.0 g/dL   RDW 10.2 72.5 - 36.6 %   Platelets 235 150 - 400 K/uL   nRBC 0.0 0.0 - 0.2 %   Neutrophils Relative % 86 %   Neutro Abs 13.0 (H) 1.7 - 7.7 K/uL   Lymphocytes Relative 8 %   Lymphs Abs 1.2 0.7 - 4.0 K/uL   Monocytes Relative 5 %   Monocytes Absolute 0.7 0.1 - 1.0 K/uL   Eosinophils Relative 0 %   Eosinophils Absolute 0.0 0.0 - 0.5 K/uL   Basophils Relative 0 %   Basophils Absolute 0.0 0.0 - 0.1 K/uL   Immature Granulocytes 1 %   Abs Immature Granulocytes 0.08 (H) 0.00 - 0.07 K/uL    Comment: Performed at [facility], [address], [place], [place] [address]  Comprehensive metabolic panel     Status: Abnormal   Collection Time: 06/04/23  4:39 AM  Result Value Ref Range   Sodium 140 135 -  145 mmol/L   Potassium 3.9 3.5 - 5.1 mmol/L   Chloride 108 98 - 111 mmol/L   CO2 22 22 - 32 mmol/L   Glucose, Bld 122 (H) 70 - 99 mg/dL    Comment: Glucose reference range applies only to samples taken after fasting for at least 8 hours.   BUN 27 (H) 8 - 23 mg/dL   Creatinine, Ser 7.42 (H) 0.44 - 1.00 mg/dL   Calcium 8.4 (L) 8.9 - 10.3 mg/dL   Total Protein 5.7 (L) 6.5 - 8.1 g/dL   Albumin 2.6 (L) 3.5 - 5.0 g/dL   AST 15 15 - 41 U/L   ALT 13 0 - 44 U/L  Alkaline Phosphatase 73 38 - 126 U/L   Total Bilirubin 0.5 0.3 - 1.2 mg/dL   GFR, Estimated 15 (L) >60 mL/min    Comment: (NOTE) Calculated using the CKD-EPI Creatinine Equation (2021)    Anion gap 10 5 - 15    Comment: Performed at [facility] Lab, [address] [place], [place] [address]  Magnesium     Status: None   Collection Time: 06/04/23  4:39 AM  Result Value Ref Range   Magnesium 1.7 1.7 - 2.4 mg/dL    Comment: Performed at [facility], [address] [place], [place] [address]  Protime-INR     Status: Abnormal   Collection Time: 06/04/23  4:39 AM  Result Value Ref Range   Prothrombin Time 15.4 (H) 11.4 - 15.2 seconds   INR 1.2 0.8 - 1.2    Comment: (NOTE) INR goal varies based on device and disease states. Performed at [facility], [address], [place], [place] [address]   Lactic acid, plasma     Status: None   Collection Time: 06/04/23  4:39 AM  Result Value Ref Range   Lactic Acid, Venous 0.7 0.5 - 1.9 mmol/L    Comment: Performed at [facility], [address] [place], [place] [address]   US Pelvis Complete  Result Date: 06/04/2023 CLINICAL DATA:  Pelvic mass seen on CT. EXAM: TRANSABDOMINAL ULTRASOUND OF PELVIS TECHNIQUE: Transabdominal ultrasound examination of the pelvis was performed including evaluation of the uterus, ovaries, adnexal regions, and pelvic cul-de-sac. COMPARISON:  06/04/2023. FINDINGS: Uterus Measurements: 6.5 x 3.6 x 4.8 cm = volume: 59.1 mL. Multiple  fibroids are present in the uterus. Endometrium Thickness: Not seen due to fibroids. Right ovary Not visualized. A right adnexal hypoechoic mass with color flow is noted measuring 3.6 x 4.0 x 4.0 cm. Left ovary Not visualized. Other findings:  Trace amount of free fluid in the right adnexa IMPRESSION: 1. Hypoechoic mass with internal vascularity in the right adnexa with indeterminate imaging features. MRI is recommended for further evaluation. 2. Uterine fibroids. 3. The ovaries are not visualized on exam. Electronically Signed   By: Thornell Sartorius M.D.   On: 06/04/2023 03:03   CT ABDOMEN PELVIS WO CONTRAST  Result Date: 06/04/2023 CLINICAL DATA:  Right lower quadrant pain EXAM: CT ABDOMEN AND PELVIS WITHOUT CONTRAST TECHNIQUE: Multidetector CT imaging of the abdomen and pelvis was performed following the standard protocol without IV contrast. RADIATION DOSE REDUCTION: This exam was performed according to the departmental dose-optimization program which includes automated exposure control, adjustment of the mA and/or kV according to patient size and/or use of iterative reconstruction technique. COMPARISON:  Chest CT 08/29/2020 FINDINGS: Lower chest: Lung bases demonstrate postsurgical changes on the right. Cardiomegaly with partially visualized cardiac pacing leads. Hepatobiliary: Gallstones. No focal hepatic abnormality or biliary dilatation. Hiatal hernia. Pancreas: Unremarkable. No pancreatic ductal dilatation or surrounding inflammatory changes. Spleen: Normal in size without focal abnormality. Adrenals/Urinary Tract: Adrenal glands are within normal limits. Simple and complex cysts within the bilateral kidneys, incompletely visualized without contrast. No convincing hydronephrosis. Slightly dense appearing right renal collecting system, series 3, image 17. Irregular exophytic lesion superior pole left kidney measuring 21 by 16 mm on series 3, image 17. Urinary bladder is unremarkable. Small right-sided  kidney stones Stomach/Bowel: The stomach is nonenlarged. There is no dilated small bowel. 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 Multiple small diverticula at the terminal ileum, coronal series 6 image 50  through 54. Tubular structure measuring up to 11 mm on coronal images, series 6 image 41 through 49, and axial series 3 image 53-55. Surrounding fat stranding. This is inseparable from a low-density structure in the right pelvis measuring 4.3 x 3.1 cm on series 3, image 55. Vascular/Lymphatic: Advanced aortic atherosclerosis. No aneurysm. No suspicious lymph nodes. Reproductive: Calcified and noncalcified uterine masses consistent with fibroids. Right adnexal ovoid structure as described above. Possible small amount of right adnexal fluid Other: Negative for free air. Musculoskeletal: No acute or suspicious osseous abnormality. IMPRESSION: 1. Tubular structure in the right lower quadrant with surrounding fat stranding, inseparable from an oval low-density structure in the right pelvis/adnexa measuring 4.3 x 3.1 cm. Findings could be secondary to appendicitis with possible abscess at the tip of the appendix and extending into the right adnexa. Other considerations could include appendiceal mass complicated by inflammation/infection versus ovarian neoplasm. Follow-up examination with enteral and IV contrast would be helpful. Pelvic ultrasound could also be considered for further assessment. 2. 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 3. Cholelithiasis. 4. Simple and complex cysts within the bilateral kidneys with additional indeterminate irregular exophytic lesion off the upper pole left kidney, incompletely characterized without contrast. Slightly dense appearing right renal collecting system. Consider further assessment with dedicated renal protocol CT or MRI. 5. Uterine fibroids. 6. Aortic atherosclerosis. Aortic Atherosclerosis (ICD10-I70.0).  Electronically Signed   By: Jasmine Pang M.D.   On: 06/04/2023 01:21   DG Chest Port 1 View  Result Date: 06/03/2023 CLINICAL DATA:  Questionable sepsis. EXAM: PORTABLE CHEST 1 VIEW COMPARISON:  Chest radiograph dated 03/30/2021. FINDINGS: No focal consolidation, pleural effusion, or pneumothorax. The cardiac silhouette is within normal limits. Left pectoral pacemaker device. No acute osseous pathology. IMPRESSION: No active disease. Electronically Signed   By: Elgie Collard M.D.   On: 06/03/2023 23:52      Assessment/Plan Appendicitis vs Right adnexal mass - low grade fever on presentation, afebrile this AM - lactic cleared, WBC 15K on admit and 14K today  - imaging unclear for appendicitis vs right adnexal process - recommending MRI, I have ordered this  - agree with abx, keep NPO for now  - we will follow, if MRI shows concern for adnexal mass would consult GYN-ONC - I called her son and updated by phone  FEN: sips/ice chips, IVF@75cc /h VTE: SQH ID: [email or web address]  - per TRH -  Dementia Chronic combined CHF CKD stage IV COPD NSC lung cancer s/p lobectomy Hx of NSTEMI PPM in place HTN HLD GERD Hx of colon polyps (tubular adenoma)   I reviewed ED provider notes, last 24 h vitals and pain scores, last 48 h intake and output, last 24 h labs and trends, and last 24 h imaging results.   Juliet Rude, [facility] 06/04/2023, 8:41 AM Please see Amion for pager number during day hours 7:00am-4:30pm

## 2023-06-04 NOTE — Evaluation (Signed)
Physical Therapy Evaluation Patient Details Name: Betty [name] MRN: [id number] DOB: 16-Sep-1946 Today's Date: 06/04/2023  History of Present Illness  Pt is 77 yo female who presents on 06/03/23 with weakness and confusion, had a syncopal episode in ED with incontinence of urine. Pt with SIRS.  Has RLQ tenderness, xray concerning for appendicitis vs adnexal mass. PMH: dementia, CKD4, CHF, COPD, NSC lung cancer s/p lobectomy, NSTEMI, HTN, HLD, GERD  Clinical Impression  Pt admitted with above diagnosis. Pt received in ED on stretcher, has been getting up independently from stretcher but is unsteady in standing and needs UE support. Ambulates with RW at baseline and reports that she lives alone and son checks on her daily but no family present on eval.  Pt stood and pivoted to and from chair in room with Min A. At this time, recommending pt return home with HHPT but will reevaluate if pt ends up having surgery. Pt currently with functional limitations due to the deficits listed below (see PT Problem List). Pt will benefit from acute skilled PT to increase their independence and safety with mobility to allow discharge.           If plan is discharge home, recommend the following: A little help with walking and/or transfers;A little help with bathing/dressing/bathroom;Assistance with cooking/housework;Assist for transportation;Help with stairs or ramp for entrance   Can travel by private vehicle        Equipment Recommendations None recommended by PT  Recommendations for Other Services  OT consult    Functional Status Assessment Patient has had a recent decline in their functional status and demonstrates the ability to make significant improvements in function in a reasonable and predictable amount of time.     Precautions / Restrictions Precautions Precautions: Fall Restrictions Weight Bearing Restrictions: No      Mobility  Bed Mobility Overal bed mobility: Needs Assistance Bed  Mobility: Supine to Sit, Sit to Supine     Supine to sit: Min guard Sit to supine: Min guard   General bed mobility comments: min guard for safety on stretcher in ED    Transfers Overall transfer level: Needs assistance Equipment used: 1 person hand held assist Transfers: Sit to/from Stand, Bed to chair/wheelchair/BSC Sit to Stand: Min assist   Step pivot transfers: Min assist       General transfer comment: needs UE support for standing, held therapist's hand on one side and bed rail on the other    Ambulation/Gait               General Gait Details: took turning steps to chair but ambulation limited by IV line to ED stretcher  Stairs            Wheelchair Mobility     Tilt Bed    Modified Rankin (Stroke Patients Only)       Balance Overall balance assessment: Needs assistance Sitting-balance support: Feet unsupported, No upper extremity supported Sitting balance-Leahy Scale: Good     Standing balance support: Single extremity supported, During functional activity Standing balance-Leahy Scale: Poor Standing balance comment: needs UE support                             Pertinent Vitals/Pain Pain Assessment Pain Assessment: Faces Faces Pain Scale: Hurts little more Pain Location: RLQ Pain Descriptors / Indicators: Aching Pain Intervention(s): Limited activity within patient's tolerance, Monitored during session    Home Living Family/patient expects to be discharged  to:: Private residence Living Arrangements: Alone Available Help at Discharge: Family;Available PRN/intermittently Type of Home: House Home Access: Stairs to enter   Entrance Stairs-Number of Steps: 5   Home Layout: One level Home Equipment: Agricultural consultant (2 wheels);Cane - single point Additional Comments: son comes by everyday but does not spend the night there. Family makes meals for her and she warms them up    Prior Function Prior Level of Function : Needs  assist             Mobility Comments: independent with RW ADLs Comments: family assists with IADL's     Hand Dominance   Dominant Hand: Right    Extremity/Trunk Assessment   Upper Extremity Assessment Upper Extremity Assessment: Generalized weakness    Lower Extremity Assessment Lower Extremity Assessment: Generalized weakness    Cervical / Trunk Assessment Cervical / Trunk Assessment: Normal  Communication   Communication: No difficulties  Cognition Arousal/Alertness: Awake/alert Behavior During Therapy: WFL for tasks assessed/performed Overall Cognitive Status: History of cognitive impairments - at baseline                                 General Comments: STM deficits noted when pt giving history but has dementia at baseline. Home info that she gave corresponds to her chart from last admission.        General Comments General comments (skin integrity, edema, etc.): BP 168/83, HR 67 bpm. No family present on eval    Exercises     Assessment/Plan    PT Assessment Patient needs continued PT services  PT Problem List Decreased strength;Decreased activity tolerance;Decreased balance;Decreased mobility;Pain;Cardiopulmonary status limiting activity       PT Treatment Interventions DME instruction;Gait training;Stair training;Functional mobility training;Therapeutic activities;Therapeutic exercise;Balance training;Neuromuscular re-education;Patient/family education;Cognitive remediation    PT Goals (Current goals can be found in the Care Plan section)  Acute Rehab PT Goals Patient Stated Goal: return home PT Goal Formulation: With patient Time For Goal Achievement: 06/18/23 Potential to Achieve Goals: Good    Frequency Min 1X/week     Co-evaluation               AM-PAC PT "6 Clicks" Mobility  Outcome Measure Help needed turning from your back to your side while in a flat bed without using bedrails?: A Little Help needed moving from  lying on your back to sitting on the side of a flat bed without using bedrails?: A Little Help needed moving to and from a bed to a chair (including a wheelchair)?: A Little Help needed standing up from a chair using your arms (e.g., wheelchair or bedside chair)?: A Little Help needed to walk in hospital room?: A Little Help needed climbing 3-5 steps with a railing? : A Lot 6 Click Score: 17    End of Session   Activity Tolerance: Patient tolerated treatment well Patient left: in bed;with call bell/phone within reach;Other (comment) (on chair alarm under her on stretcher, turned on) Nurse Communication: Mobility status PT Visit Diagnosis: Unsteadiness on feet (R26.81);Difficulty in walking, not elsewhere classified (R26.2);Pain Pain - Right/Left: Right Pain - part of body:  (abdomen)    Time: 1610-9604 PT Time Calculation (min) (ACUTE ONLY): 16 min   Charges:   PT Evaluation $PT Eval Moderate Complexity: 1 Mod   PT General Charges $$ ACUTE PT VISIT: 1 Visit         Lyanne Co, PT  Acute Rehab Services Secure  chat preferred Office [phone]   Lawana Chambers Aarin Bluett 06/04/2023, 4:14 PM

## 2023-06-04 NOTE — Progress Notes (Signed)
Patient has Medtronic generator, two Medtronic leads and a ST. Jude lead. Because of the st. Jude lead she is labeled as MR unsafe.

## 2023-06-04 NOTE — Progress Notes (Addendum)
  Carryover admission to the Day Admitter.  I discussed this case with the EDP, Dr. Blinda Leatherwood.  Per these discussions:   This is a 77 year old female with underlying dementia, who is being admitted with suspected acute metabolic encephalopathy, SIRS, right lower quadrant abdominal discomfort after presenting for evaluation of confusion over the course the last day superimposed on her baseline dementia.   Patient's son provides the history, and conveys that the patient has been confused relative to her baseline dementia over the last day.  He attempts to qualify this by stating that over the last day, the patient has been intentionally urinating on the floor at home, which would be atypical for her.   He brought the patient to [facility], ED for further evaluation management thereof, and upon rising from a seated to standing position and attempting to get out of a car, the patient syncopized.  At that time her systolic blood pressures were noted to be in the 60s.  Elevated temperature 100.6, mildly tachypneic.  Labs notable for white blood cell count 15,000.  Initial lactate 3.7, with repeat trending down to 2.1 following interval IV fluids.  Urinalysis was reported to be inconsistent with UTI, will just x-ray shows no evidence of acute cardiopulmonary process, including no evidence of pneumonia.  The patient is also been complaining of some right lower quadrant discomfort, prompting ultrasound and CT abdomen/pelvis both of which were equivocal for appendicitis versus ovarian mass.   Subsequently EDP discussed patient's case with on-call general surgery who will formally consult on these equivocal right lower quadrant radiographic findings.   Additional labs notable for acute kidney injury superimposed on CKD 3B.  Blood cultures x 2 were collected prior to initiation of broad-spectrum IV antibiotics and COVID PCR was negative.  Following IV fluids, blood pressure is significantly improved, with  most recent systolic blood pressures in the 100s.  I have placed an order for inpatient admission to PCU for further evaluation management of the above.  I have placed some additional preliminary admit orders via the adult multi-morbid admission order set. I have also ordered additional IV antibiotics in the form of Rocephin and IV Flagyl, as well as updated lactic acid level to be checked in the morning.  Have also ordered additional morning labs in the form of CMP, CBC, serum magnesium level and INR.  If continued existing order for continuous lactated Ringer's running at 150 cc/h.  For her residual right lower quadrant abdominal discomfort I placed order for prn IV fentanyl 12.5 mg every 2 hours as needed.  I will defer additional CODE STATUS clarification to the admitting hospitalist.    Newton Pigg, DO Hospitalist

## 2023-06-04 NOTE — Sepsis Progress Note (Signed)
Following for sepsis monitoring ?

## 2023-06-04 NOTE — Progress Notes (Signed)
ED Pharmacy Antibiotic Sign Off An antibiotic consult was received from an ED provider for Vancomycin/Cefepime per pharmacy dosing for sepsis. A chart review was completed to assess appropriateness.   The following one time order(s) were placed:  Vancomycin 1000 mg IV x 1 Cefepime 2g IV x 1   Further antibiotic and/or antibiotic pharmacy consults should be ordered by the admitting provider if indicated.   Thank you for allowing pharmacy to be a part of this patient's care.   Abran Duke, PharmD, BCPS Clinical Pharmacist Phone: [phone]

## 2023-06-05 DIAGNOSIS — G9341 Metabolic encephalopathy: Secondary | ICD-10-CM | POA: Diagnosis not present

## 2023-06-05 DIAGNOSIS — N184 Chronic kidney disease, stage 4 (severe): Secondary | ICD-10-CM | POA: Diagnosis not present

## 2023-06-05 DIAGNOSIS — I5022 Chronic systolic (congestive) heart failure: Secondary | ICD-10-CM | POA: Diagnosis not present

## 2023-06-05 DIAGNOSIS — R1010 Upper abdominal pain, unspecified: Secondary | ICD-10-CM

## 2023-06-05 MED ORDER — NEPRO/CARBSTEADY PO LIQD
237.0000 mL | Freq: Two times a day (BID) | ORAL | Status: DC
  Administered 2023-06-05 – 2023-06-06 (×2): 237 mL via ORAL

## 2023-06-05 MED ORDER — METOPROLOL SUCCINATE ER 25 MG PO TB24
12.5000 mg | ORAL_TABLET | Freq: Every day | ORAL | Status: DC
  Administered 2023-06-05 – 2023-06-06 (×2): 12.5 mg via ORAL
  Filled 2023-06-05 (×2): qty 1

## 2023-06-05 MED ORDER — RENA-VITE PO TABS
1.0000 | ORAL_TABLET | Freq: Every day | ORAL | Status: DC
  Administered 2023-06-05: 1 via ORAL
  Filled 2023-06-05: qty 1

## 2023-06-05 NOTE — Evaluation (Signed)
Occupational Therapy Evaluation Patient Details Name: Betty [name] MRN: [id number] DOB: 1946-01-03 Today's Date: 06/05/2023   History of Present Illness Pt is 77 yo female who presents on 06/03/23 with weakness and confusion, had a syncopal episode in ED with incontinence of urine. Pt with SIRS.  Has RLQ tenderness, xray concerning for appendicitis vs adnexal mass. PMH: dementia, CKD4, CHF, COPD, NSC lung cancer s/p lobectomy, NSTEMI, HTN, HLD, GERD   Clinical Impression   At baseline, pt completes ADLs and functional mobility with a RW Independent to Mod I. At baseline, pt lives alone with daily check-in from her son who assists with meal prep, home management tasks, and transportation. Pt heats up meal in the microwave Independently at baseline. Pt now presents with decreased activity tolerance, generalized B UE weakness, and decreased safety and independence with ADLs and functional mobility/transfers. Pt currently demonstrates ability to complete UB ADLs with Set up to Min guard assist, LB ADLs with Min assist, and functional transfers with hand held assist +1 with Min assist. Pt will benefit from acute skilled OT services to address deficits outlined below, decrease caregiver burden, and increase safety and independence with ADLs, functional transfers, and functional mobility. Post acute discharge, pt will benefit from continued skilled OT services in the home to maximize rehab potential. OT also recommends 24 hour supervision/assistance from family until pt has returned to baseline PLOF to increase pt safety and decrease the risk of falls.      Recommendations for follow up therapy are one component of a multi-disciplinary discharge planning process, led by the attending physician.  Recommendations may be updated based on patient status, additional functional criteria and insurance authorization.   Assistance Recommended at Discharge Frequent or constant Supervision/Assistance  Patient can return  home with the following A little help with walking and/or transfers;A little help with bathing/dressing/bathroom;Assistance with cooking/housework;Direct supervision/assist for medications management;Direct supervision/assist for financial management;Assist for transportation;Help with stairs or ramp for entrance;Assistance with feeding (Set up for self feeding)    Functional Status Assessment  Patient has had a recent decline in their functional status and demonstrates the ability to make significant improvements in function in a reasonable and predictable amount of time.  Equipment Recommendations  Tub/shower seat;BSC/3in1    Recommendations for Other Services       Precautions / Restrictions Precautions Precautions: Fall Restrictions Weight Bearing Restrictions: No      Mobility Bed Mobility Overal bed mobility: Needs Assistance Bed Mobility: Rolling, Supine to Sit, Sit to Supine Rolling: Modified independent (Device/Increase time)   Supine to sit: Supervision, HOB elevated (cues for hand placement/technique) Sit to supine: Supervision (with cues for technique)        Transfers Overall transfer level: Needs assistance Equipment used: 1 person hand held assist Transfers: Sit to/from Stand Sit to Stand: Min assist                  Balance Overall balance assessment: Needs assistance Sitting-balance support: Feet unsupported, No upper extremity supported Sitting balance-Leahy Scale: Good     Standing balance support: Single extremity supported Standing balance-Leahy Scale: Poor Standing balance comment: needs UE support to maintain balance                           ADL either performed or assessed with clinical judgement   ADL Overall ADL's : Needs assistance/impaired Eating/Feeding: Set up;Sitting   Grooming: Set up;Sitting Grooming Details (indicate cue type and reason): cues for  initiation Upper Body Bathing: Set up;Cueing for sequencing  (cues for initiation)   Lower Body Bathing: Minimal assistance;Cueing for safety;Cueing for sequencing;Sit to/from stand   Upper Body Dressing : Min guard;Sitting;Cueing for sequencing   Lower Body Dressing: Minimal assistance;Cueing for safety;Cueing for sequencing;Sit to/from stand   Toilet Transfer: Minimal assistance;Cueing for safety;Cueing for sequencing;BSC/3in1 (Hand held assist +1, step-pivot transfer)             General ADL Comments: Functional mobility deferred this session for pt/therapist safety seconday to pt lethergy this session.     Vision Baseline Vision/History: 1 Wears glasses (readers) Ability to See in Adequate Light: 0 Adequate (with glasses) Patient Visual Report: No change from baseline       Perception     Praxis      Pertinent Vitals/Pain Pain Assessment Pain Assessment: No/denies pain Faces Pain Scale: No hurt Pain Descriptors / Indicators:  (Pt reported she "had pain yesterday, but now it's better.") Pain Intervention(s): Monitored during session     Hand Dominance Right   Extremity/Trunk Assessment Upper Extremity Assessment Upper Extremity Assessment: Generalized weakness   Lower Extremity Assessment Lower Extremity Assessment: Defer to PT evaluation   Cervical / Trunk Assessment Cervical / Trunk Assessment: Normal   Communication Communication Communication: No difficulties   Cognition Arousal/Alertness: Lethargic (Mildly increased alertness upon sitting EOB. Pt reporting fatigue throughout session.) Behavior During Therapy: WFL for tasks assessed/performed Overall Cognitive Status: History of cognitive impairments - at baseline                                 General Comments: Pt oriented to self and birthday, not oriented to place (states she is at her brother's house), situation, or time (states it is 93). Pt with dementia at baseline. Pt able to follow one step commands consistently with increased time and  occasional visual or verbal cues.     General Comments  VSS on RA throughout session . Pt requiring max encouragement to participate in eval secondary to pt report of fatigue. No family or caregiver present during OT eval.    Exercises     Shoulder Instructions      Home Living Family/patient expects to be discharged to:: Private residence Living Arrangements: Alone Available Help at Discharge: Family;Available PRN/intermittently Type of Home: House Home Access: Stairs to enter Entrance Stairs-Number of Steps: 5 Entrance Stairs-Rails:  (Pt unable to report) Home Layout: One level     Bathroom Shower/Tub: Chief Strategy Officer: Standard     Home Equipment: Agricultural consultant (2 wheels);Cane - single point   Additional Comments: Pt's son comes by daily but does not spend the night there. Family makes meals for her and she warms them up      Prior Functioning/Environment Prior Level of Function : Needs assist             Mobility Comments: independent with RW ADLs Comments: Independent with ADLs and family assists with IADL's        OT Problem List: Decreased strength;Decreased activity tolerance;Impaired balance (sitting and/or standing);Decreased knowledge of use of DME or AE      OT Treatment/Interventions: Self-care/ADL training;Therapeutic exercise;DME and/or AE instruction;Therapeutic activities;Patient/family education;Balance training    OT Goals(Current goals can be found in the care plan section) Acute Rehab OT Goals Patient Stated Goal: Pt unable to state OT Goal Formulation: Patient unable to participate in goal setting Time For Goal  Achievement: 06/19/23 Potential to Achieve Goals: Good ADL Goals Pt Will Perform Grooming: with supervision;standing Pt Will Perform Lower Body Bathing: with supervision;sit to/from stand Pt Will Perform Lower Body Dressing: with supervision;sit to/from stand Pt Will Transfer to Toilet: with  supervision;ambulating;regular height toilet (with least restrictive AD) Pt Will Perform Toileting - Clothing Manipulation and hygiene: with supervision;sitting/lateral leans;sit to/from stand Pt/caregiver will Perform Home Exercise Program: Increased strength;Both right and left upper extremity;With theraband;With theraputty;With Supervision;With written HEP provided (with cues as needed)  OT Frequency: Min 1X/week    Co-evaluation              AM-PAC OT "6 Clicks" Daily Activity     Outcome Measure Help from another person eating meals?: A Little (Set up) Help from another person taking care of personal grooming?: A Little (Set up and cues for initation) Help from another person toileting, which includes using toliet, bedpan, or urinal?: A Little Help from another person bathing (including washing, rinsing, drying)?: A Little Help from another person to put on and taking off regular upper body clothing?: A Little Help from another person to put on and taking off regular lower body clothing?: A Little 6 Click Score: 18   End of Session Nurse Communication: Mobility status  Activity Tolerance: Patient limited by fatigue;Patient limited by lethargy Patient left: in bed;with call bell/phone within reach;with bed alarm set  OT Visit Diagnosis: Unsteadiness on feet (R26.81);Muscle weakness (generalized) (M62.81)                Time: 9147-8295 OT Time Calculation (min): 17 min Charges:  OT General Charges $OT Visit: 1 Visit OT Evaluation $OT Eval Low Complexity: 1 Low  Liviya Santini "Kyle" M., OTR/L, MA Acute Rehab [phone]   Lendon Colonel 06/05/2023, 11:52 AM

## 2023-06-05 NOTE — Plan of Care (Signed)

## 2023-06-05 NOTE — Progress Notes (Signed)
Overnight event  Notified by RN that patient is refusing subcutaneous heparin for DVT prophylaxis.  SCDs ordered.

## 2023-06-05 NOTE — Evaluation (Signed)
Speech Language Pathology Evaluation Patient Details Name: Betty [name] MRN: [id number] DOB: December 11, 1945 Today's Date: 06/05/2023 Time: 4010-2725 SLP Time Calculation (min) (ACUTE ONLY): 22 min  Problem List:  Patient Active Problem List   Diagnosis Date Noted   Acute metabolic encephalopathy 06/04/2023   Abdominal pain 06/04/2023   Abdominal mass 06/04/2023   Allergic rhinitis 12/14/2021   Bilateral pleural effusion 12/14/2021   Cardiac pacemaker in situ 12/14/2021   Coronary artery disease 12/14/2021   Esophagitis, unspecified with bleeding 12/14/2021   Gastro-esophageal reflux disease without esophagitis 12/14/2021   Gout 12/14/2021   Hardening of the aorta (main artery of the heart) (HCC) 12/14/2021   Left knee pain 12/14/2021   Malignant neoplasm of lower lobe, unspecified bronchus or lung (HCC) 12/14/2021   Mixed hyperlipidemia 12/14/2021   Multi-infarct dementia, uncomplicated (HCC) 12/14/2021   Personal history of colonic polyps 12/14/2021   Primary localized osteoarthritis of pelvic region and thigh 12/14/2021   Tobacco user 12/14/2021   Vitamin D deficiency 12/14/2021   CKD (chronic kidney disease) stage 5, GFR less than 15 ml/min (HCC) 11/14/2021   GI bleed 06/24/2019   Hypotension 06/24/2019   Electrolyte abnormality 06/24/2019   Pressure injury of skin 05/29/2019   Altered mental status    Acute respiratory failure with hypoxemia (HCC)    COVID-19 05/14/2019   Essential hypertension 05/14/2019   Dementia (HCC) 07/16/2018   Cerebral aneurysm, nonruptured 07/16/2018   Nonischemic cardiomyopathy (HCC) 05/21/2018   Congestive heart failure, NYHA class 3, chronic, systolic (HCC) 05/21/2018   Chronic systolic heart failure (HCC)    Chest pain 04/22/2018   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   NSTEMI (non-ST elevated myocardial infarction) (HCC) 03/24/2018   Pure hypercholesterolemia    CKD (chronic kidney disease), stage IV (HCC)    Non-small cell  carcinoma of right lung, stage 1 (HCC) 05/17/2015   S/P lobectomy of lung 04/29/2015   Past Medical History:  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)    Arthritis    bursitis in right shoulder (05/21/2018)   Blood transfusion without reported diagnosis    Cerebral aneurysm, nonruptured 07/16/2018   Right anterior communicating artery   CKD (chronic kidney disease), stage IV (HCC)    Colon polyps    COPD (chronic obstructive pulmonary disease) (HCC)    emphysema   Dementia (HCC) 07/16/2018   GERD (gastroesophageal reflux disease)    Hypercholesterolemia    Hypertension    Non-small cell carcinoma of right lung, stage 1 (HCC) 05/17/2015   Stage IB, right upper lobectomy 04/29/15, chemo    NSTEMI (non-ST elevated myocardial infarction) (HCC) 03/24/2018   Presence of permanent cardiac pacemaker 05/21/2018   Pure hypercholesterolemia    Sciatica of right side    Seasonal allergic rhinitis    Tobacco dependence    Tubular adenoma of colon    Vitamin D deficiency    Past Surgical History:  Past Surgical History:  Procedure Laterality Date   BIOPSY  06/26/2019   Procedure: BIOPSY;  Surgeon: Charlott Rakes, MD;  Location: [facility] ENDOSCOPY;  Service: Endoscopy;;   BIV PACEMAKER INSERTION CRT-P N/A 05/21/2018   Procedure: BIV PACEMAKER INSERTION CRT-P;  Surgeon: Duke Salvia, MD;  Location: [facility] INVASIVE CV LAB;  Service: Cardiovascular;  Laterality: N/A;   BREAST SURGERY     small mass removed from left breast--benign   CRYO INTERCOSTAL NERVE BLOCK Right 04/29/2015   Procedure: CRYO INTERCOSTAL NERVE BLOCK;  Surgeon: Loreli Slot, MD;  Location: [facility] OR;  Service: Thoracic;  Laterality: Right;   ESOPHAGOGASTRODUODENOSCOPY (EGD) WITH PROPOFOL N/A 06/26/2019   Procedure: ESOPHAGOGASTRODUODENOSCOPY (EGD) WITH PROPOFOL;  Surgeon: Charlott Rakes, MD;  Location: [facility] ENDOSCOPY;  Service: Endoscopy;  Laterality: N/A;    INSERT / REPLACE / REMOVE PACEMAKER  05/21/2018   LOBECTOMY Right 04/29/2015   Procedure: RIGHT LOWER LUNG LOBECTOMY ;  Surgeon: Loreli Slot, MD;  Location: [facility] OR;  Service: Thoracic;  Laterality: Right;   LYMPH NODE DISSECTION Right 04/29/2015   Procedure: RIGHT LUNG LYMPH NODE DISSECTION;  Surgeon: Loreli Slot, MD;  Location: [facility] OR;  Service: Thoracic;  Laterality: Right;   RIGHT/LEFT HEART CATH AND CORONARY ANGIOGRAPHY N/A 04/24/2018   Procedure: RIGHT/LEFT HEART CATH AND CORONARY ANGIOGRAPHY;  Surgeon: Runell Gess, MD;  Location: [facility] INVASIVE CV LAB;  Service: Cardiovascular;  Laterality: N/A;   VAGINAL DELIVERY     51 yrs ago   VIDEO ASSISTED THORACOSCOPY (VATS)/ LOBECTOMY Right 04/29/2015   Procedure: RIGHT VIDEO ASSISTED THORACOSCOPY (VATS) WEDGE RESECTION/ RIGHT LOWER LOBECTOMY, CRYO-ANALGESIA OF INTERCOSTAL NERVES;  Surgeon: Loreli Slot, MD;  Location: [facility] OR;  Service: Thoracic;  Laterality: Right;   HPI:  Betty [name] is a 77 yo female presenting to ED 7/30 with AMS, incontinence of urine, and weakness. Per MD note, pt was syncopal in triage, was hypotensive with a low grade fever and found to have a lactic acidosis. Pelvic US with hypoechoic mass in R adnexa with indeterminate imaging features. PMH includes HFrEF, CKD4, COPD, dementia, HTN, HLD, stage 1 NSCLC, CAD, pacemaker   Assessment / Plan / Recommendation Clinical Impression  SLP called pt's son, [name], to obtain more information regarding pt's history and baseline due to pt's disorientation. He reports pt lives alone and he helps her with medications and finances. He states that pt is typically oriented to person and place, although does have fluctuation moments of orientation and memory due to baseline dementia. He reports pt is presenting different than baseline. Portions of the Cognistat were administered with noted deficits in orientation, attention, memory, and auditory comprehension. Pt reports she  is currently in a church in [place], [place] and the date is September [birth year], 1957. Throughout the session she was perseverative on the specific dates on which she lost loved ones. Pt with noted delayed response time during all activities. She had difficulty following simple commands and performing simple calculations. Pt  with difficulty with repetition, particularly as the phrases increased complexity. Suspect pt's mentation remains different from baseline. She may benefit from increased assistance upon d/c if not resolved. Will continue to follow to address cognition as able.    SLP Assessment  SLP Recommendation/Assessment: Patient needs continued Speech Lanaguage Pathology Services SLP Visit Diagnosis: Cognitive communication deficit (R41.841)    Recommendations for follow up therapy are one component of a multi-disciplinary discharge planning process, led by the attending physician.  Recommendations may be updated based on patient status, additional functional criteria and insurance authorization.    Follow Up Recommendations  Skilled nursing-short term rehab (<3 hours/day)    Assistance Recommended at Discharge  Frequent or constant Supervision/Assistance  Functional Status Assessment Patient has had a recent decline in their functional status and demonstrates the ability to make significant improvements in function in a reasonable and predictable amount of time.  Frequency and Duration min 2x/week  2 weeks      SLP Evaluation Cognition  Overall Cognitive Status: History of cognitive impairments -  at baseline Arousal/Alertness: Awake/alert Orientation Level: Oriented to person;Disoriented to place;Disoriented to time;Disoriented to situation Year: Other (Comment) [id number]) Month: October Day of Week: Incorrect Attention: Sustained Sustained Attention: Impaired Sustained Attention Impairment: Verbal basic Memory: Impaired Memory Impairment: Retrieval deficit Awareness: Impaired Awareness  Impairment: Intellectual impairment Problem Solving: Impaired Problem Solving Impairment: Verbal basic       Comprehension  Auditory Comprehension Overall Auditory Comprehension: Impaired Yes/No Questions: Not tested Commands: Impaired One Step Basic Commands: 50-74% accurate Two Step Basic Commands: 0-24% accurate    Expression Expression Primary Mode of Expression: Verbal Verbal Expression Overall Verbal Expression: Impaired Repetition: Impaired Level of Impairment: Word level;Phrase level;Sentence level Written Expression Dominant Hand: Right   Oral / Motor  Oral Motor/Sensory Function Overall Oral Motor/Sensory Function: Within functional limits Motor Speech Overall Motor Speech: Appears within functional limits for tasks assessed            Gwynneth Aliment, M.A., CF-SLP Speech Language Pathology, Acute Rehabilitation Services  Secure Chat preferred [phone]  06/05/2023, 2:12 PM

## 2023-06-05 NOTE — Progress Notes (Signed)
Progress Note     Subjective: Pt reports she feels bad this AM, like the "doctors are trying to kill her". Reports abdominal pain but not able to characterize or be more specific than that. Tolerating CLD and having bowel function.   Objective: Vital signs in last 24 hours: Temp:  [97.9 F (36.6 C)-99.2 F (37.3 C)] 99.2 F (37.3 C) (07/31 0825) Pulse Rate:  [69-92] 78 (07/31 0825) Resp:  [16-23] 19 (07/31 0825) BP: (127-167)/(62-78) 133/74 (07/31 0825) SpO2:  [92 %-99 %] 95 % (07/31 0825)    Intake/Output from previous day: 07/30 0701 - 07/31 0700 In: 672 [I.V.:571.3; IV Piggyback:100.7] Out: 2 [Urine:1; Stool:1] Intake/Output this shift: Total I/O In: 1300 [I.V.:1200; IV Piggyback:100] Out: -   PE: General: WD, elderly female who is laying in bed in NAD Lungs: Respiratory effort nonlabored Abd: soft, no ttp appreciated, ND, +BS, no masses, hernias, or organomegaly MS: all 4 extremities are symmetrical with no cyanosis, clubbing, or edema. Skin: warm and dry with no masses, lesions, or rashes Neuro: speech clear, follows command Psych: alert, oriented to self    Lab Results:  Recent Labs    06/04/23 0439 06/05/23 0550  WBC 14.9* 14.1*  HGB 8.7* 10.6*  HCT 26.5* 31.4*  PLT 235 263   BMET Recent Labs    06/04/23 0439 06/05/23 0550  NA 140 143  K 3.9 3.8  CL 108 105  CO2 22 24  GLUCOSE 122* 65*  BUN 27* 22  CREATININE 3.08* 2.80*  CALCIUM 8.4* 9.0   PT/INR Recent Labs    06/03/23 2317 06/04/23 0439  LABPROT 15.1 15.4*  INR 1.2 1.2   CMP     Component Value Date/Time   NA 143 06/05/2023 0550   NA 143 06/02/2018 1505   NA 145 09/18/2016 1057   K 3.8 06/05/2023 0550   K 4.9 09/18/2016 1057   CL 105 06/05/2023 0550   CO2 24 06/05/2023 0550   CO2 22 09/18/2016 1057   GLUCOSE 65 (L) 06/05/2023 0550   GLUCOSE 93 09/18/2016 1057   BUN 22 06/05/2023 0550   BUN 28 (H) 06/02/2018 1505   BUN 39.6 (H) 09/18/2016 1057   CREATININE 2.80 (H)  06/05/2023 0550   CREATININE 2.66 (H) 08/12/2020 0948   CREATININE 2.8 (H) 09/18/2016 1057   CALCIUM 9.0 06/05/2023 0550   CALCIUM 9.8 09/18/2016 1057   PROT 5.7 (L) 06/04/2023 0439   PROT 7.6 09/18/2016 1057   ALBUMIN 2.6 (L) 06/04/2023 0439   ALBUMIN 3.6 09/18/2016 1057   AST 15 06/04/2023 0439   AST 21 08/12/2020 0948   AST 19 09/18/2016 1057   ALT 13 06/04/2023 0439   ALT 23 08/12/2020 0948   ALT 12 09/18/2016 1057   ALKPHOS 73 06/04/2023 0439   ALKPHOS 123 09/18/2016 1057   BILITOT 0.5 06/04/2023 0439   BILITOT 0.6 08/12/2020 0948   BILITOT 0.35 09/18/2016 1057   GFRNONAA 17 (L) 06/05/2023 0550   GFRNONAA 17 (L) 08/12/2020 0948   GFRAA 23 (L) 08/11/2019 1108   Lipase     Component Value Date/Time   LIPASE 44 06/24/2019 1348       Studies/Results: US Pelvis Complete  Result Date: 06/04/2023 CLINICAL DATA:  Pelvic mass seen on CT. EXAM: TRANSABDOMINAL ULTRASOUND OF PELVIS TECHNIQUE: Transabdominal ultrasound examination of the pelvis was performed including evaluation of the uterus, ovaries, adnexal regions, and pelvic cul-de-sac. COMPARISON:  06/04/2023. FINDINGS: Uterus Measurements: 6.5 x 3.6 x 4.8 cm = volume: 59.1  mL. Multiple fibroids are present in the uterus. Endometrium Thickness: Not seen due to fibroids. Right ovary Not visualized. A right adnexal hypoechoic mass with color flow is noted measuring 3.6 x 4.0 x 4.0 cm. Left ovary Not visualized. Other findings:  Trace amount of free fluid in the right adnexa IMPRESSION: 1. Hypoechoic mass with internal vascularity in the right adnexa with indeterminate imaging features. MRI is recommended for further evaluation. 2. Uterine fibroids. 3. The ovaries are not visualized on exam. Electronically Signed   By: Thornell Sartorius M.D.   On: 06/04/2023 03:03   CT ABDOMEN PELVIS WO CONTRAST  Result Date: 06/04/2023 CLINICAL DATA:  Right lower quadrant pain EXAM: CT ABDOMEN AND PELVIS WITHOUT CONTRAST TECHNIQUE: Multidetector CT  imaging of the abdomen and pelvis was performed following the standard protocol without IV contrast. RADIATION DOSE REDUCTION: This exam was performed according to the departmental dose-optimization program which includes automated exposure control, adjustment of the mA and/or kV according to patient size and/or use of iterative reconstruction technique. COMPARISON:  Chest CT 08/29/2020 FINDINGS: Lower chest: Lung bases demonstrate postsurgical changes on the right. Cardiomegaly with partially visualized cardiac pacing leads. Hepatobiliary: Gallstones. No focal hepatic abnormality or biliary dilatation. Hiatal hernia. Pancreas: Unremarkable. No pancreatic ductal dilatation or surrounding inflammatory changes. Spleen: Normal in size without focal abnormality. Adrenals/Urinary Tract: Adrenal glands are within normal limits. Simple and complex cysts within the bilateral kidneys, incompletely visualized without contrast. No convincing hydronephrosis. Slightly dense appearing right renal collecting system, series 3, image 17. Irregular exophytic lesion superior pole left kidney measuring 21 by 16 mm on series 3, image 17. Urinary bladder is unremarkable. Small right-sided kidney stones Stomach/Bowel: The stomach is nonenlarged. There is no dilated small bowel. 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 Multiple small diverticula at the terminal ileum, coronal series 6 image 50 through 54. Tubular structure measuring up to 11 mm on coronal images, series 6 image 41 through 49, and axial series 3 image 53-55. Surrounding fat stranding. This is inseparable from a low-density structure in the right pelvis measuring 4.3 x 3.1 cm on series 3, image 55. Vascular/Lymphatic: Advanced aortic atherosclerosis. No aneurysm. No suspicious lymph nodes. Reproductive: Calcified and noncalcified uterine masses consistent with fibroids. Right adnexal ovoid structure as described above. Possible small amount of  right adnexal fluid Other: Negative for free air. Musculoskeletal: No acute or suspicious osseous abnormality. IMPRESSION: 1. Tubular structure in the right lower quadrant with surrounding fat stranding, inseparable from an oval low-density structure in the right pelvis/adnexa measuring 4.3 x 3.1 cm. Findings could be secondary to appendicitis with possible abscess at the tip of the appendix and extending into the right adnexa. Other considerations could include appendiceal mass complicated by inflammation/infection versus ovarian neoplasm. Follow-up examination with enteral and IV contrast would be helpful. Pelvic ultrasound could also be considered for further assessment. 2. 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 3. Cholelithiasis. 4. Simple and complex cysts within the bilateral kidneys with additional indeterminate irregular exophytic lesion off the upper pole left kidney, incompletely characterized without contrast. Slightly dense appearing right renal collecting system. Consider further assessment with dedicated renal protocol CT or MRI. 5. Uterine fibroids. 6. Aortic atherosclerosis. Aortic Atherosclerosis (ICD10-I70.0). Electronically Signed   By: Jasmine Pang M.D.   On: 06/04/2023 01:21   DG Chest Port 1 View  Result Date: 06/03/2023 CLINICAL DATA:  Questionable sepsis. EXAM: PORTABLE CHEST 1 VIEW COMPARISON:  Chest radiograph dated 03/30/2021. FINDINGS: No focal consolidation,  pleural effusion, or pneumothorax. The cardiac silhouette is within normal limits. Left pectoral pacemaker device. No acute osseous pathology. IMPRESSION: No active disease. Electronically Signed   By: Elgie Collard M.D.   On: 06/03/2023 23:52    Anti-infectives: Anti-infectives (From admission, onward)    Start     Dose/Rate Route Frequency Ordered Stop   06/04/23 2200  cefTRIAXone (ROCEPHIN) 1 g in sodium chloride 0.9 % 100 mL IVPB  Status:  Discontinued        1 g 200 mL/hr over 30  Minutes Intravenous Every 24 hours 06/04/23 0414 06/04/23 0938   06/04/23 2200  cefTRIAXone (ROCEPHIN) 2 g in sodium chloride 0.9 % 100 mL IVPB        2 g 200 mL/hr over 30 Minutes Intravenous Every 24 hours 06/04/23 0938     06/04/23 1000  metroNIDAZOLE (FLAGYL) IVPB 500 mg        500 mg 100 mL/hr over 60 Minutes Intravenous Every 12 hours 06/04/23 0414     06/04/23 0015  ceFEPIme (MAXIPIME) 2 g in sodium chloride 0.9 % 100 mL IVPB        2 g 200 mL/hr over 30 Minutes Intravenous  Once 06/04/23 0001 06/04/23 0101   06/04/23 0015  metroNIDAZOLE (FLAGYL) IVPB 500 mg        500 mg 100 mL/hr over 60 Minutes Intravenous  Once 06/04/23 0001 06/04/23 0203   06/04/23 0015  vancomycin (VANCOCIN) IVPB 1000 mg/200 mL premix        1,000 mg 200 mL/hr over 60 Minutes Intravenous  Once 06/04/23 0001 06/04/23 0303   06/04/23 0000  cefTRIAXone (ROCEPHIN) 2 g in sodium chloride 0.9 % 100 mL IVPB  Status:  Discontinued        2 g 200 mL/hr over 30 Minutes Intravenous Every 24 hours 06/03/23 2353 06/04/23 0000        Assessment/Plan  Appendicitis vs Right adnexal mass - low grade fever on presentation, continues to be afebrile  - WBC stable at 14 - continue IV abx - imaging unclear for appendicitis vs right adnexal process - recommending MRI, pt unable to get MRI because one of her leads is not MRI compatible - tolerating CLD - ok to advance to low fiber diet - check some tumor markers that might be elevated with ovarian process - no urgent/emergent plans for surgical intervention at this time    FEN: low fiber diet, SLIV VTE: SQH ID: [email or web address]   - per TRH -  Dementia Chronic combined CHF CKD stage IV COPD NSC lung cancer s/p lobectomy Hx of NSTEMI PPM in place HTN HLD GERD Hx of colon polyps (tubular adenoma)    LOS: 1 day   I reviewed hospitalist notes, last 24 h vitals and pain scores, last 48 h intake and output, last 24 h labs and trends, and last 24 h imaging  results.    Juliet Rude, [facility] 06/05/2023, 11:08 AM Please see Amion for pager number during day hours 7:00am-4:30pm

## 2023-06-05 NOTE — Progress Notes (Signed)
Initial Nutrition Assessment  DOCUMENTATION CODES:   Not applicable  INTERVENTION:  Renal Multivitamin w/ minerals daily Nepro Shake po BID, each supplement provides 425 kcal and 19 grams protein Encourage good PO intake  Feeding assist with all meals   NUTRITION DIAGNOSIS:   Increased nutrient needs related to chronic illness (COPD, CHF) as evidenced by estimated needs.  GOAL:   Patient will meet greater than or equal to 90% of their needs  MONITOR:   PO intake, Supplement acceptance, Labs, Weight trends, I & O's  REASON FOR ASSESSMENT:   Consult  (nutritional goals)  ASSESSMENT:   77 y.o. female presented to the ED with worsening confusion. PMH include COPD, dementia, HTN, HLD, CAD, CKD IV, CHF, and GERD. Pt admitted with acute metabolic encephalopathy and abdominal pain.   Pt noted to be alert to person only.   Pt laying in bed, reports that she had a good appetite at home. Denies any nausea or vomiting. Lunch tray in front of pt, states she is done. RD observed <25% of tray consumed.  Denies drinking any nutritional supplements at home. Pt unsure of any recent weight loss, states that if she has lost weight she couldn't tell.   Pt currently with Nepro ordered PRN, RD to adjust the order and schedule supplements. Pt likely would benefit form feeding assist due to disorientation.   Medications reviewed and include: Colace, Protonix, IV antibiotics  Labs reviewed: Sodium 143, Potassium 3.8, BUN 22, Creatinine 2.80  NUTRITION - FOCUSED PHYSICAL EXAM:  Deferred to follow-up.   Diet Order:   Diet Order             DIET SOFT Room service appropriate? No; Fluid consistency: Thin  Diet effective now                   EDUCATION NEEDS:   Not appropriate for education at this time  Skin:  Skin Assessment: Reviewed RN Assessment  Last BM:  7/31 - Type 6  Height:   Ht Readings from Last 1 Encounters:  06/03/23 5\' 4"  (1.626 m)    Weight:   Wt Readings  from Last 1 Encounters:  06/03/23 55.8 kg    Ideal Body Weight:  54.6 kg  BMI:  Body mass index is 21.12 kg/m.  Estimated Nutritional Needs:  Kcal:  1650-1850 Protein:  80-100 grams Fluid:  >/= 1.6 L   Kirby Crigler RD, LDN Clinical Dietitian See [facility] for contact information.

## 2023-06-05 NOTE — Progress Notes (Addendum)
PROGRESS NOTE        PATIENT DETAILS Name: Betty [name] Age: 77 y.o. Sex: female Date of Birth: 12-Mar-1946 Admit Date: 06/03/2023 Admitting Physician Jonah Blue, MD NWG:[id number], [place], [place]  Brief Summary: Patient is a 77 y.o.  female with history of HFrEF, CKD 4, COPD, HTN, stage I NSCLC, CAD, pacemaker in place, dementia-who presented with worsening confusion beyond her usual baseline.  She was found to have RLQ pain on exam-further imaging revealed tubular structure in the right lower quadrant-related to appendix versus adnexa.  Started on empiric antibiotics-[facility] consulted-and subsequently admitted to [facility].  Significant events: 7/29>> admit to [facility]  Significant studies: 7/29>> CT abdomen/pelvis: Tubular structure RLQ-with surrounding fat stranding-inseparable from low-density structure in the right pelvis-findings could be secondary to appendicitis with possible abscess extending into the right adnexa-versus ovarian neoplasm. 7/30>> pelvic ultrasound: Hypoechoic mass with internal vascularity in the right adnexa with indeterminate imaging features.  Significant microbiology data: 7/29>> blood culture: No growth 7/30>> COVID PCR: Negative  Procedures: None  Consults: General surgery  Subjective: Lying comfortably in bed-denies any chest pain or shortness of breath.  Mild RLQ pain in the past.  Objective: Vitals: Blood pressure 133/74, pulse 78, temperature 99.2 F (37.3 C), temperature source Oral, resp. rate 19, height 5\' 4"  (1.626 m), weight 55.8 kg, SpO2 95%.   Exam: Gen Exam:Alert awake-not in any distress HEENT:atraumatic, normocephalic Chest: B/L clear to auscultation anteriorly CVS:S1S2 regular Abdomen: Soft-very mild RLQ pain on deep palpation. Extremities:no edema Neurology: Non focal Skin: no rash  Pertinent Labs/Radiology:    Latest Ref Rng & Units 06/05/2023    5:50 AM 06/04/2023    4:39 AM 06/03/2023   11:17 PM  CBC  WBC  4.0 - 10.5 K/uL 14.1  14.9  15.3   Hemoglobin 12.0 - 15.0 g/dL 08.6  8.7  57.8   Hematocrit 36.0 - 46.0 % 31.4  26.5  32.0   Platelets 150 - 400 K/uL 263  235  296     Lab Results  Component Value Date   NA 143 06/05/2023   K 3.8 06/05/2023   CL 105 06/05/2023   CO2 24 06/05/2023      Assessment/Plan: Acute metabolic encephalopathy Likely secondary to possible abscess of appendix Seems to have improved-answering questions appropriately this morning-but no family at bedside-unclear if she is back to baseline.  RLQ pain secondary to appendix abscess versus right ovarian mass Hardly any RLQ pain today Unfortunately PPM is incompatible with MRI Suspect will need to continue with empiric antibiotics-follow CBC/clinical course and repeat CT imaging in the next few days General surgery following  CKD 4  creatinine fluctuating but not far from usual baseline Avoid nephrotoxic agents  Nonobstructive CAD (per [facility] in 2019) Chronic systolic heart failure CRT-P placement 05/2018 for LV dysfunction  Euvolemic Continue aspirin/statin Continue Isordil Resume Toprol at lower dose  HTN BP stable Continue Isordil Resuming Toprol at a lower dose-we will slowly increase over the next several days  COPD Stable-not in exacerbation Bronchodilators  NSCLC-s/p RLL lobectomy-completed 4 cycles of adjuvant chemotherapy Appears in remission Follow-up with oncology as outpatient  Dementia Mental status improved compared to presentation No family at bedside-but suspect she is not that far from baseline Maintain delirium precautions Continue Seroquel/Namenda/Aricept  Simple/complex cyst within bilateral kidneys Outpatient renal protocol CT if possible-MRI not able to be  done due to pacemaker not being compatible.  Uterine fibroids Seen incidentally on CT abdomen Outpatient GYN follow-up  BMI: Estimated body mass index is 21.12 kg/m as calculated from the following:   Height as of  this encounter: 5\' 4"  (1.626 m).   Weight as of this encounter: 55.8 kg.   Code status:   Code Status: Full Code   DVT Prophylaxis: heparin injection 5,000 Units Start: 06/04/23 0930   Family Communication: None at bedside   Disposition Plan: Status is: Inpatient Remains inpatient appropriate because: Severity of illness   Planned Discharge Destination:Home health   Diet: Diet Order             DIET SOFT Room service appropriate? Yes; Fluid consistency: Thin  Diet effective now                     Antimicrobial agents: Anti-infectives (From admission, onward)    Start     Dose/Rate Route Frequency Ordered Stop   06/04/23 2200  cefTRIAXone (ROCEPHIN) 1 g in sodium chloride 0.9 % 100 mL IVPB  Status:  Discontinued        1 g 200 mL/hr over 30 Minutes Intravenous Every 24 hours 06/04/23 0414 06/04/23 0938   06/04/23 2200  cefTRIAXone (ROCEPHIN) 2 g in sodium chloride 0.9 % 100 mL IVPB        2 g 200 mL/hr over 30 Minutes Intravenous Every 24 hours 06/04/23 0938     06/04/23 1000  metroNIDAZOLE (FLAGYL) IVPB 500 mg        500 mg 100 mL/hr over 60 Minutes Intravenous Every 12 hours 06/04/23 0414     06/04/23 0015  ceFEPIme (MAXIPIME) 2 g in sodium chloride 0.9 % 100 mL IVPB        2 g 200 mL/hr over 30 Minutes Intravenous  Once 06/04/23 0001 06/04/23 0101   06/04/23 0015  metroNIDAZOLE (FLAGYL) IVPB 500 mg        500 mg 100 mL/hr over 60 Minutes Intravenous  Once 06/04/23 0001 06/04/23 0203   06/04/23 0015  vancomycin (VANCOCIN) IVPB 1000 mg/200 mL premix        1,000 mg 200 mL/hr over 60 Minutes Intravenous  Once 06/04/23 0001 06/04/23 0303   06/04/23 0000  cefTRIAXone (ROCEPHIN) 2 g in sodium chloride 0.9 % 100 mL IVPB  Status:  Discontinued        2 g 200 mL/hr over 30 Minutes Intravenous Every 24 hours 06/03/23 2353 06/04/23 0000        MEDICATIONS: Scheduled Meds:  aspirin EC  81 mg Oral QHS   atorvastatin  40 mg Oral q1800   docusate sodium  100 mg  Oral BID   donepezil  10 mg Oral QHS   febuxostat  40 mg Oral QHS   heparin  5,000 Units Subcutaneous Q8H   isosorbide dinitrate  20 mg Oral TID   loratadine  10 mg Oral Daily   memantine  10 mg Oral BID   pantoprazole  40 mg Oral q morning   QUEtiapine  25 mg Oral QHS   sodium bicarbonate  650 mg Oral BID   sodium chloride flush  3 mL Intravenous Q12H   Continuous Infusions:  cefTRIAXone (ROCEPHIN)  IV 2 g (06/04/23 2238)   lactated ringers 75 mL/hr at 06/04/23 2234   metronidazole 500 mg (06/05/23 0842)   PRN Meds:.acetaminophen **OR** acetaminophen, bisacodyl, calcium carbonate (dosed in mg elemental calcium), camphor-menthol **AND** hydrOXYzine, docusate sodium, feeding supplement (  NEPRO CARB STEADY), hydrALAZINE, ondansetron **OR** ondansetron (ZOFRAN) IV, polyethylene glycol, sorbitol   I have personally reviewed following labs and imaging studies  LABORATORY DATA: CBC: Recent Labs  Lab 06/03/23 2317 06/04/23 0439 06/05/23 0550  WBC 15.3* 14.9* 14.1*  NEUTROABS 11.5* 13.0*  --   HGB 10.2* 8.7* 10.6*  HCT 32.0* 26.5* 31.4*  MCV 94.4 92.0 91.5  PLT 296 235 263    Basic Metabolic Panel: Recent Labs  Lab 06/03/23 2317 06/04/23 0439 06/05/23 0550  NA 146* 140 143  K 3.9 3.9 3.8  CL 109 108 105  CO2 18* 22 24  GLUCOSE 212* 122* 65*  BUN 31* 27* 22  CREATININE 3.92* 3.08* 2.80*  CALCIUM 9.1 8.4* 9.0  MG  --  1.7  --     GFR: Estimated Creatinine Clearance: 14.3 mL/min (A) (by C-G formula based on SCr of 2.8 mg/dL (H)).  Liver Function Tests: Recent Labs  Lab 06/03/23 2317 06/04/23 0439  AST 22 15  ALT 14 13  ALKPHOS 84 73  BILITOT 0.8 0.5  PROT 6.8 5.7*  ALBUMIN 3.2* 2.6*   No results for input(s): "LIPASE", "AMYLASE" in the last 168 hours. No results for input(s): "AMMONIA" in the last 168 hours.  Coagulation Profile: Recent Labs  Lab 06/03/23 2317 06/04/23 0439  INR 1.2 1.2    Cardiac Enzymes: No results for input(s): "CKTOTAL",  "CKMB", "CKMBINDEX", "TROPONINI" in the last 168 hours.  BNP (last 3 results) No results for input(s): "PROBNP" in the last 8760 hours.  Lipid Profile: No results for input(s): "CHOL", "HDL", "LDLCALC", "TRIG", "CHOLHDL", "LDLDIRECT" in the last 72 hours.  Thyroid Function Tests: No results for input(s): "TSH", "T4TOTAL", "FREET4", "T3FREE", "THYROIDAB" in the last 72 hours.  Anemia Panel: No results for input(s): "VITAMINB12", "FOLATE", "FERRITIN", "TIBC", "IRON", "RETICCTPCT" in the last 72 hours.  Urine analysis:    Component Value Date/Time   COLORURINE YELLOW 06/03/2023 2333   APPEARANCEUR CLEAR 06/03/2023 2333   LABSPEC 1.010 06/03/2023 2333   PHURINE 6.0 06/03/2023 2333   GLUCOSEU NEGATIVE 06/03/2023 2333   HGBUR NEGATIVE 06/03/2023 2333   BILIRUBINUR NEGATIVE 06/03/2023 2333   KETONESUR NEGATIVE 06/03/2023 2333   PROTEINUR 100 (A) 06/03/2023 2333   UROBILINOGEN 0.2 04/27/2015 0834   NITRITE NEGATIVE 06/03/2023 2333   LEUKOCYTESUR TRACE (A) 06/03/2023 2333    Sepsis Labs: Lactic Acid, Venous    Component Value Date/Time   LATICACIDVEN 0.7 06/04/2023 0439    MICROBIOLOGY: Recent Results (from the past 240 hour(s))  Blood Culture (routine x 2)     Status: None (Preliminary result)   Collection Time: 06/03/23 11:13 PM   Specimen: BLOOD  Result Value Ref Range Status   Specimen Description BLOOD SITE NOT SPECIFIED  Final   Special Requests   Final    BOTTLES DRAWN AEROBIC ONLY Blood Culture results may not be optimal due to an inadequate volume of blood received in culture bottles   Culture   Final    NO GROWTH 1 DAY Performed at [facility], [address], [place], [place] [address]    Report Status PENDING  Incomplete  SARS Coronavirus 2 by RT PCR (hospital order, performed in [facility] hospital lab) *cepheid single result test* Anterior Nasal Swab     Status: None   Collection Time: 06/04/23 12:00 AM   Specimen: Anterior Nasal Swab  Result Value  Ref Range Status   SARS Coronavirus 2 by RT PCR NEGATIVE NEGATIVE Final    Comment:  Performed at [facility], [address], [place], [place] [address]  Blood Culture (routine x 2)     Status: None (Preliminary result)   Collection Time: 06/04/23 12:13 AM   Specimen: BLOOD  Result Value Ref Range Status   Specimen Description BLOOD SITE NOT SPECIFIED  Final   Special Requests   Final    BOTTLES DRAWN AEROBIC AND ANAEROBIC Blood Culture adequate volume   Culture   Final    NO GROWTH 1 DAY Performed at [facility], [address], [place], [place] [address]    Report Status PENDING  Incomplete    RADIOLOGY STUDIES/RESULTS: US Pelvis Complete  Result Date: 06/04/2023 CLINICAL DATA:  Pelvic mass seen on CT. EXAM: TRANSABDOMINAL ULTRASOUND OF PELVIS TECHNIQUE: Transabdominal ultrasound examination of the pelvis was performed including evaluation of the uterus, ovaries, adnexal regions, and pelvic cul-de-sac. COMPARISON:  06/04/2023. FINDINGS: Uterus Measurements: 6.5 x 3.6 x 4.8 cm = volume: 59.1 mL. Multiple fibroids are present in the uterus. Endometrium Thickness: Not seen due to fibroids. Right ovary Not visualized. A right adnexal hypoechoic mass with color flow is noted measuring 3.6 x 4.0 x 4.0 cm. Left ovary Not visualized. Other findings:  Trace amount of free fluid in the right adnexa IMPRESSION: 1. Hypoechoic mass with internal vascularity in the right adnexa with indeterminate imaging features. MRI is recommended for further evaluation. 2. Uterine fibroids. 3. The ovaries are not visualized on exam. Electronically Signed   By: Thornell Sartorius M.D.   On: 06/04/2023 03:03   CT ABDOMEN PELVIS WO CONTRAST  Result Date: 06/04/2023 CLINICAL DATA:  Right lower quadrant pain EXAM: CT ABDOMEN AND PELVIS WITHOUT CONTRAST TECHNIQUE: Multidetector CT imaging of the abdomen and pelvis was performed following the standard protocol without IV contrast. RADIATION DOSE REDUCTION: This exam  was performed according to the departmental dose-optimization program which includes automated exposure control, adjustment of the mA and/or kV according to patient size and/or use of iterative reconstruction technique. COMPARISON:  Chest CT 08/29/2020 FINDINGS: Lower chest: Lung bases demonstrate postsurgical changes on the right. Cardiomegaly with partially visualized cardiac pacing leads. Hepatobiliary: Gallstones. No focal hepatic abnormality or biliary dilatation. Hiatal hernia. Pancreas: Unremarkable. No pancreatic ductal dilatation or surrounding inflammatory changes. Spleen: Normal in size without focal abnormality. Adrenals/Urinary Tract: Adrenal glands are within normal limits. Simple and complex cysts within the bilateral kidneys, incompletely visualized without contrast. No convincing hydronephrosis. Slightly dense appearing right renal collecting system, series 3, image 17. Irregular exophytic lesion superior pole left kidney measuring 21 by 16 mm on series 3, image 17. Urinary bladder is unremarkable. Small right-sided kidney stones Stomach/Bowel: The stomach is nonenlarged. There is no dilated small bowel. 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 Multiple small diverticula at the terminal ileum, coronal series 6 image 50 through 54. Tubular structure measuring up to 11 mm on coronal images, series 6 image 41 through 49, and axial series 3 image 53-55. Surrounding fat stranding. This is inseparable from a low-density structure in the right pelvis measuring 4.3 x 3.1 cm on series 3, image 55. Vascular/Lymphatic: Advanced aortic atherosclerosis. No aneurysm. No suspicious lymph nodes. Reproductive: Calcified and noncalcified uterine masses consistent with fibroids. Right adnexal ovoid structure as described above. Possible small amount of right adnexal fluid Other: Negative for free air. Musculoskeletal: No acute or suspicious osseous abnormality. IMPRESSION: 1. Tubular  structure in the right lower quadrant with surrounding fat stranding, inseparable from an oval low-density structure in the  right pelvis/adnexa measuring 4.3 x 3.1 cm. Findings could be secondary to appendicitis with possible abscess at the tip of the appendix and extending into the right adnexa. Other considerations could include appendiceal mass complicated by inflammation/infection versus ovarian neoplasm. Follow-up examination with enteral and IV contrast would be helpful. Pelvic ultrasound could also be considered for further assessment. 2. 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 3. Cholelithiasis. 4. Simple and complex cysts within the bilateral kidneys with additional indeterminate irregular exophytic lesion off the upper pole left kidney, incompletely characterized without contrast. Slightly dense appearing right renal collecting system. Consider further assessment with dedicated renal protocol CT or MRI. 5. Uterine fibroids. 6. Aortic atherosclerosis. Aortic Atherosclerosis (ICD10-I70.0). Electronically Signed   By: Jasmine Pang M.D.   On: 06/04/2023 01:21   DG Chest Port 1 View  Result Date: 06/03/2023 CLINICAL DATA:  Questionable sepsis. EXAM: PORTABLE CHEST 1 VIEW COMPARISON:  Chest radiograph dated 03/30/2021. FINDINGS: No focal consolidation, pleural effusion, or pneumothorax. The cardiac silhouette is within normal limits. Left pectoral pacemaker device. No acute osseous pathology. IMPRESSION: No active disease. Electronically Signed   By: Elgie Collard M.D.   On: 06/03/2023 23:52     LOS: 1 day   Jeoffrey Massed, MD  [facility]    To contact the attending provider between 7A-7P or the covering provider during after hours 7P-7A, please log into the web site [email or web address] and access using universal [place] password for that web site. If you do not have the password, please call the hospital operator.  06/05/2023, 10:31 AM

## 2023-06-06 ENCOUNTER — Other Ambulatory Visit (HOSPITAL_COMMUNITY): Payer: Self-pay

## 2023-06-06 DIAGNOSIS — I10 Essential (primary) hypertension: Secondary | ICD-10-CM

## 2023-06-06 DIAGNOSIS — R1900 Intra-abdominal and pelvic swelling, mass and lump, unspecified site: Secondary | ICD-10-CM | POA: Diagnosis not present

## 2023-06-06 DIAGNOSIS — N184 Chronic kidney disease, stage 4 (severe): Secondary | ICD-10-CM | POA: Diagnosis not present

## 2023-06-06 DIAGNOSIS — G9341 Metabolic encephalopathy: Secondary | ICD-10-CM | POA: Diagnosis not present

## 2023-06-06 MED ORDER — AMOXICILLIN-POT CLAVULANATE 500-125 MG PO TABS
1.0000 | ORAL_TABLET | Freq: Two times a day (BID) | ORAL | 0 refills | Status: AC
  Filled 2023-06-06: qty 28, 14d supply, fill #0

## 2023-06-06 NOTE — TOC Transition Note (Signed)
Transition of Care [facility]) - CM/SW Discharge Note   Patient Details  Name: Betty [name] MRN: [id number] Date of Birth: 05-May-1946  [facility]) CM/SW Contact:  Gordy Clement, RN Phone Number: 06/06/2023, 10:01 AM   Clinical Narrative:     Patient will DC to home today with Home Health PT/OT provided by [facility]  A BSC has also been ordered and will be delivered bedside prior to DC by [employer]. Son will transport to home. AVS updated   No additional TOC needs           Patient Goals and CMS Choice      Discharge Placement                         Discharge Plan and Services Additional resources added to the After Visit Summary for                                       Social Determinants of Health (SDOH) Interventions SDOH Screenings   Food Insecurity: No Food Insecurity (06/04/2023)  Housing: Low Risk  (06/04/2023)  Transportation Needs: No Transportation Needs (06/04/2023)  Utilities: Not At Risk (06/04/2023)  Alcohol Screen: Low Risk  (11/28/2021)  Depression (PHQ2-9): Low Risk  (11/28/2021)  Financial Resource Strain: Low Risk  (11/28/2021)  Physical Activity: Inactive (11/28/2021)  Social Connections: Unknown (11/28/2021)  Stress: No Stress Concern Present (11/28/2021)  Tobacco Use: Medium Risk (06/03/2023)     Readmission Risk Interventions     No data to display

## 2023-06-06 NOTE — Discharge Summary (Signed)
PATIENT DETAILS Name: Betty [name] Age: 77 y.o. Sex: female Date of Birth: [birth year] MRN: [id number]. Admitting Physician: Jonah Blue, MD NWG:[id number], [place]oria [place], [place]  Admit Date: 06/03/2023 Discharge date: 06/06/2023  Recommendations for Outpatient Follow-up:  Follow up with PCP in 1-2 weeks Please obtain CMP/CBC in one week Ensure follow-up with general surgery clinic in 2 weeks for repeat CT imaging/posthospital discharge follow-up. Has simple/complex cysts-PCP to order CT renal protocol-as this is an incidental finding   Admitted From:  Home  Disposition: Home health   Discharge Condition: good  CODE STATUS:   Code Status: Full Code   Diet recommendation:  Diet Order             Diet - low sodium heart healthy           DIET SOFT Room service appropriate? No; Fluid consistency: Thin  Diet effective now                    Brief Summary: Patient is a 77 y.o.  female with history of HFrEF, CKD 4, COPD, HTN, stage I NSCLC, CAD, pacemaker in place, dementia-who presented with worsening confusion beyond her usual baseline.  She was found to have RLQ pain on exam-further imaging revealed tubular structure in the right lower quadrant-related to appendix versus adnexa.  Started on empiric antibiotics-[facility]S consulted-and subsequently admitted to [facility].   Significant events: 7/29>> admit to [facility]   Significant studies: 7/29>> CT abdomen/pelvis: Tubular structure RLQ-with surrounding fat stranding-inseparable from low-density structure in the right pelvis-findings could be secondary to appendicitis with possible abscess extending into the right adnexa-versus ovarian neoplasm. 7/30>> pelvic ultrasound: Hypoechoic mass with internal vascularity in the right adnexa with indeterminate imaging features.   Significant microbiology data: 7/29>> blood culture: No growth 7/30>> COVID PCR: Negative   Procedures: None   Consults: General surgery  Brief Hospital  Course: Acute metabolic encephalopathy Likely secondary to possible abscess of appendix Mentation has improved-back to baseline (son present at bedside on 8/1)    RLQ pain secondary to appendix abscess versus right ovarian mass Improved with empiric IV antibiotics  Ideally needs a MRI but unfortunately has a PPM that is incompatible with MRI. Thankfully clinically improved-discussed with Dr. Ann Maki today-Augmentin x 2 weeks-and then reimaging with CT.  General surgery will arrange for follow-up. As noted above clinically improved-no further RLQ pain-tolerating advancement in diet.    CKD 4  creatinine fluctuating but not far from usual baseline Avoid nephrotoxic agents   Nonobstructive CAD (per [facility] in 2019) Chronic systolic heart failure CRT-P placement 05/2018 for LV dysfunction  Euvolemic Continue aspirin/statin Continue Isordil Resume Toprol at usual dosing on discharge.   HTN BP stable Continue Isordil Resuming Toprol at usual dosing on discharge CT CTA negative   COPD Stable-not in exacerbation Bronchodilators   NSCLC-s/p RLL lobectomy-completed 4 cycles of adjuvant chemotherapy Appears in remission Follow-up with oncology as outpatient   Dementia Clinically improved-mental status back to baseline. Continue Seroquel/Namenda/Aricept   Simple/complex cyst within bilateral kidneys Outpatient renal protocol CT if possible-MRI not able to be done due to pacemaker not being compatible.  This will be deferred to the outpatient setting/PCP.   Uterine fibroids Seen incidentally on CT abdomen Outpatient GYN follow-up   BMI: Estimated body mass index is 21.12 kg/m as calculated from the following:   Height as of this encounter: 5\' 4"  (1.626 m).   Weight as of this encounter: 55.8 kg.    Nutrition Status: Nutrition Problem: Increased  nutrient needs Etiology: chronic illness (COPD, CHF) Signs/Symptoms: estimated needs Interventions: MVI, Nepro shake     Discharge Diagnoses:  Principal Problem:   Acute metabolic encephalopathy Active Problems:   CKD (chronic kidney disease), stage IV (HCC)   Chronic systolic heart failure (HCC)   Dementia (HCC)   Essential hypertension   Abdominal pain   Abdominal mass   Discharge Instructions:  Activity:  As tolerated   Discharge Instructions     Call MD for:  persistant nausea and vomiting   Complete by: As directed    Call MD for:  severe uncontrolled pain   Complete by: As directed    Diet - low sodium heart healthy   Complete by: As directed    Discharge instructions   Complete by: As directed    Follow with Primary MD  Hyacinth Meeker, [place] E, [place] in 1-2 weeks  Follow-up with general surgery clinic-the office will give you a call-you will need a repeat CT scan after you complete 2 weeks of oral antibiotics.  Please talk to your primary care practitioner-you were incidentally found to have some cysts in both your kidneys-you may require further imaging-since this is a incidental finding-this can be pursued by your primary care practitioner in the outpatient setting.  In some cases-the cysts can turn cancerous over time.  Please get a complete blood count and chemistry panel checked by your Primary MD at your next visit, and again as instructed by your Primary MD.  Get Medicines reviewed and adjusted: Please take all your medications with you for your next visit with your Primary MD  Laboratory/radiological data: Please request your Primary MD to go over all hospital tests and procedure/radiological results at the follow up, please ask your Primary MD to get all Hospital records sent to his/her office.  In some cases, they will be blood work, cultures and biopsy results pending at the time of your discharge. Please request that your primary care M.D. follows up on these results.  Also Note the following: If you experience worsening of your admission symptoms, develop shortness of breath,  life threatening emergency, suicidal or homicidal thoughts you must seek medical attention immediately by calling 911 or calling your MD immediately  if symptoms less severe.  You must read complete instructions/literature along with all the possible adverse reactions/side effects for all the Medicines you take and that have been prescribed to you. Take any new Medicines after you have completely understood and accpet all the possible adverse reactions/side effects.   Do not drive when taking Pain medications or sleeping medications (Benzodaizepines)  Do not take more than prescribed Pain, Sleep and Anxiety Medications. It is not advisable to combine anxiety,sleep and pain medications without talking with your primary care practitioner  Special Instructions: If you have smoked or chewed Tobacco  in the last 2 yrs please stop smoking, stop any regular Alcohol  and or any Recreational drug use.  Wear Seat belts while driving.  Please note: You were cared for by a hospitalist during your hospital stay. Once you are discharged, your primary care physician will handle any further medical issues. Please note that NO REFILLS for any discharge medications will be authorized once you are discharged, as it is imperative that you return to your primary care physician (or establish a relationship with a primary care physician if you do not have one) for your post hospital discharge needs so that they can reassess your need for medications and monitor your lab values.  Increase activity slowly   Complete by: As directed       Allergies as of 06/06/2023       Reactions   Sulfa Antibiotics Itching        Medication List     TAKE these medications    acetaminophen 325 MG tablet Commonly known as: TYLENOL Take 650 mg by mouth every 6 (six) hours as needed for mild pain or moderate pain.   amoxicillin-clavulanate 500-125 MG tablet Commonly known as: AUGMENTIN Take 1 tablet by mouth 2 (two) times  daily for 14 days.   aspirin EC 81 MG tablet Take 81 mg by mouth at bedtime.   atorvastatin 40 MG tablet Commonly known as: LIPITOR TAKE 1 TABLET BY MOUTH EVERY DAY AT 6 PM   cetirizine 10 MG tablet Commonly known as: ZYRTEC Take 10 mg by mouth daily.   donepezil 10 MG tablet Commonly known as: ARICEPT Take 1 tablet (10 mg total) by mouth at bedtime.   febuxostat 40 MG tablet Commonly known as: ULORIC Take 40 mg by mouth at bedtime.   hydrALAZINE 50 MG tablet Commonly known as: APRESOLINE TAKE 1 TABLET(50 MG) BY MOUTH THREE TIMES DAILY   isosorbide dinitrate 20 MG tablet Commonly known as: ISORDIL TAKE 1 TABLET BY MOUTH THREE TIMES DAILY   memantine 10 MG tablet Commonly known as: NAMENDA Take 1 tablet (10 mg total) by mouth 2 (two) times daily.   metoprolol succinate 50 MG 24 hr tablet Commonly known as: TOPROL-XL TAKE 1 TABLET(50 MG) BY MOUTH DAILY WITH OR IMMEDIATELY FOLLOWING A MEAL   multivitamin with minerals Tabs tablet Take 0.5 tablets by mouth at bedtime.   nitroGLYCERIN 0.4 MG SL tablet Commonly known as: NITROSTAT Place 1 tablet (0.4 mg total) under the tongue every 5 (five) minutes x 3 doses as needed for chest pain.   pantoprazole 40 MG tablet Commonly known as: PROTONIX Take 40 mg by mouth every morning.   QUEtiapine 25 MG tablet Commonly known as: SEROQUEL Take 25 mg by mouth at bedtime.   sodium bicarbonate 650 MG tablet Take 1 tablet (650 mg total) by mouth 2 (two) times daily.   Vitamin D 50 MCG (2000 UT) tablet Take 1,000 Units by mouth at bedtime.               Durable Medical Equipment  (From admission, onward)           Start     Ordered   06/06/23 0956  For home use only DME Bedside commode  Once       Question:  Patient needs a bedside commode to treat with the following condition  Answer:  Acute metabolic encephalopathy   06/06/23 0955            Follow-up Information     [facility]  Schedule an appointment as soon as possible for a visit in 2 week(s).   Specialty: General Surgery Why: for re-evaluation of abdomen and F/U imaging Contact information: [address] [place] [address] [phone]        [facility] Follow up.   Specialty: Home Health Services Why: [facility] will contact you within 48 hours of discharge to schedule 1st home visit. Contact information: [address] [place] [address] [phone]         [facility] Follow up.   Why: [employer] will provide a bedside commode Contact information: [address] [place] [address] [phone]                Allergies  Allergen Reactions   Sulfa Antibiotics Itching     Other Procedures/Studies: US Pelvis Complete  Result Date: 06/04/2023 CLINICAL DATA:  Pelvic mass seen on CT. EXAM: TRANSABDOMINAL ULTRASOUND OF PELVIS TECHNIQUE: Transabdominal ultrasound examination of the pelvis was performed including evaluation of the uterus, ovaries, adnexal regions, and pelvic cul-de-sac. COMPARISON:  06/04/2023. FINDINGS: Uterus Measurements: 6.5 x 3.6 x 4.8 cm = volume: 59.1 mL. Multiple fibroids are present in the uterus. Endometrium Thickness: Not seen due to fibroids. Right ovary Not visualized. A right adnexal hypoechoic mass with color flow is noted measuring 3.6 x 4.0 x 4.0 cm. Left ovary Not visualized. Other findings:  Trace amount of free fluid in the right adnexa IMPRESSION: 1. Hypoechoic mass with internal vascularity in the right adnexa with indeterminate imaging features. MRI is recommended for further evaluation. 2. Uterine fibroids. 3. The ovaries are not visualized on exam. Electronically Signed   By: Thornell Sartorius M.D.   On: 06/04/2023 03:03   CT ABDOMEN PELVIS WO CONTRAST  Result Date: 06/04/2023 CLINICAL DATA:  Right lower quadrant pain EXAM: CT ABDOMEN AND PELVIS WITHOUT CONTRAST TECHNIQUE: Multidetector  CT imaging of the abdomen and pelvis was performed following the standard protocol without IV contrast. RADIATION DOSE REDUCTION: This exam was performed according to the departmental dose-optimization program which includes automated exposure control, adjustment of the mA and/or kV according to patient size and/or use of iterative reconstruction technique. COMPARISON:  Chest CT 08/29/2020 FINDINGS: Lower chest: Lung bases demonstrate postsurgical changes on the right. Cardiomegaly with partially visualized cardiac pacing leads. Hepatobiliary: Gallstones. No focal hepatic abnormality or biliary dilatation. Hiatal hernia. Pancreas: Unremarkable. No pancreatic ductal dilatation or surrounding inflammatory changes. Spleen: Normal in size without focal abnormality. Adrenals/Urinary Tract: Adrenal glands are within normal limits. Simple and complex cysts within the bilateral kidneys, incompletely visualized without contrast. No convincing hydronephrosis. Slightly dense appearing right renal collecting system, series 3, image 17. Irregular exophytic lesion superior pole left kidney measuring 21 by 16 mm on series 3, image 17. Urinary bladder is unremarkable. Small right-sided kidney stones Stomach/Bowel: The stomach is nonenlarged. There is no dilated small bowel. 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 Multiple small diverticula at the terminal ileum, coronal series 6 image 50 through 54. Tubular structure measuring up to 11 mm on coronal images, series 6 image 41 through 49, and axial series 3 image 53-55. Surrounding fat stranding. This is inseparable from a low-density structure in the right pelvis measuring 4.3 x 3.1 cm on series 3, image 55. Vascular/Lymphatic: Advanced aortic atherosclerosis. No aneurysm. No suspicious lymph nodes. Reproductive: Calcified and noncalcified uterine masses consistent with fibroids. Right adnexal ovoid structure as described above. Possible small amount of  right adnexal fluid Other: Negative for free air. Musculoskeletal: No acute or suspicious osseous abnormality. IMPRESSION: 1. Tubular structure in the right lower quadrant with surrounding fat stranding, inseparable from an oval low-density structure in the right pelvis/adnexa measuring 4.3 x 3.1 cm. Findings could be secondary to appendicitis with possible abscess at the tip of the appendix and extending into the right adnexa. Other considerations could include appendiceal mass complicated by inflammation/infection versus ovarian neoplasm. Follow-up examination with enteral and IV contrast would be helpful. Pelvic ultrasound could also be considered for further assessment. 2. 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 3. Cholelithiasis. 4. Simple and complex cysts within the  bilateral kidneys with additional indeterminate irregular exophytic lesion off the upper pole left kidney, incompletely characterized without contrast. Slightly dense appearing right renal collecting system. Consider further assessment with dedicated renal protocol CT or MRI. 5. Uterine fibroids. 6. Aortic atherosclerosis. Aortic Atherosclerosis (ICD10-I70.0). Electronically Signed   By: Jasmine Pang M.D.   On: 06/04/2023 01:21   DG Chest Port 1 View  Result Date: 06/03/2023 CLINICAL DATA:  Questionable sepsis. EXAM: PORTABLE CHEST 1 VIEW COMPARISON:  Chest radiograph dated 03/30/2021. FINDINGS: No focal consolidation, pleural effusion, or pneumothorax. The cardiac silhouette is within normal limits. Left pectoral pacemaker device. No acute osseous pathology. IMPRESSION: No active disease. Electronically Signed   By: Elgie Collard M.D.   On: 06/03/2023 23:52     TODAY-DAY OF DISCHARGE:  Subjective:   Betty [name] today has no headache,no chest abdominal pain,no new weakness tingling or numbness, feels much better wants to go home today.   Objective:   Blood pressure (!) 148/74, pulse 80, temperature  97.6 F (36.4 C), temperature source Oral, resp. rate 18, height 5\' 4"  (1.626 m), weight 55.8 kg, SpO2 95%.  Intake/Output Summary (Last 24 hours) at 06/06/2023 1209 Last data filed at 06/06/2023 1021 Gross per 24 hour  Intake 100 ml  Output --  Net 100 ml   Filed Weights   06/03/23 2313  Weight: 55.8 kg    Exam: Awake Alert, Oriented *3, No new F.N deficits, Normal affect [place].AT,PERRAL Supple Neck,No JVD, No cervical lymphadenopathy appriciated.  Symmetrical Chest wall movement, Good air movement bilaterally, CTAB RRR,No Gallops,Rubs or new Murmurs, No Parasternal Heave +ve B.Sounds, Abd Soft, Non tender, No organomegaly appriciated, No rebound -guarding or rigidity. No Cyanosis, Clubbing or edema, No new Rash or bruise   PERTINENT RADIOLOGIC STUDIES: No results found.   PERTINENT LAB RESULTS: CBC: Recent Labs    06/05/23 0550 06/06/23 0927  WBC 14.1* 10.2  HGB 10.6* 10.3*  HCT 31.4* 31.4*  PLT 263 275   CMET CMP     Component Value Date/Time   NA 143 06/06/2023 0927   NA 143 06/02/2018 1505   NA 145 09/18/2016 1057   K 3.7 06/06/2023 0927   K 4.9 09/18/2016 1057   CL 107 06/06/2023 0927   CO2 24 06/06/2023 0927   CO2 22 09/18/2016 1057   GLUCOSE 80 06/06/2023 0927   GLUCOSE 93 09/18/2016 1057   BUN 23 06/06/2023 0927   BUN 28 (H) 06/02/2018 1505   BUN 39.6 (H) 09/18/2016 1057   CREATININE 2.79 (H) 06/06/2023 0927   CREATININE 2.66 (H) 08/12/2020 0948   CREATININE 2.8 (H) 09/18/2016 1057   CALCIUM 8.5 (L) 06/06/2023 0927   CALCIUM 9.8 09/18/2016 1057   PROT 5.7 (L) 06/04/2023 0439   PROT 7.6 09/18/2016 1057   ALBUMIN 2.6 (L) 06/04/2023 0439   ALBUMIN 3.6 09/18/2016 1057   AST 15 06/04/2023 0439   AST 21 08/12/2020 0948   AST 19 09/18/2016 1057   ALT 13 06/04/2023 0439   ALT 23 08/12/2020 0948   ALT 12 09/18/2016 1057   ALKPHOS 73 06/04/2023 0439   ALKPHOS 123 09/18/2016 1057   BILITOT 0.5 06/04/2023 0439   BILITOT 0.6 08/12/2020 0948   BILITOT  0.35 09/18/2016 1057   EGFR 19 (L) 09/18/2016 1057   GFRNONAA 17 (L) 06/06/2023 0927   GFRNONAA 17 (L) 08/12/2020 0948    GFR Estimated Creatinine Clearance: 14.4 mL/min (A) (by C-G formula based on SCr of 2.79 mg/dL (H)). No results for input(s): "  LIPASE", "AMYLASE" in the last 72 hours. No results for input(s): "CKTOTAL", "CKMB", "CKMBINDEX", "TROPONINI" in the last 72 hours. Invalid input(s): "POCBNP" No results for input(s): "DDIMER" in the last 72 hours. No results for input(s): "HGBA1C" in the last 72 hours. No results for input(s): "CHOL", "HDL", "LDLCALC", "TRIG", "CHOLHDL", "LDLDIRECT" in the last 72 hours. No results for input(s): "TSH", "T4TOTAL", "T3FREE", "THYROIDAB" in the last 72 hours.  Invalid input(s): "FREET3" No results for input(s): "VITAMINB12", "FOLATE", "FERRITIN", "TIBC", "IRON", "RETICCTPCT" in the last 72 hours. Coags: Recent Labs    06/03/23 2317 06/04/23 0439  INR 1.2 1.2   Microbiology: Recent Results (from the past 240 hour(s))  Blood Culture (routine x 2)     Status: None (Preliminary result)   Collection Time: 06/03/23 11:13 PM   Specimen: BLOOD  Result Value Ref Range Status   Specimen Description BLOOD SITE NOT SPECIFIED  Final   Special Requests   Final    BOTTLES DRAWN AEROBIC ONLY Blood Culture results may not be optimal due to an inadequate volume of blood received in culture bottles   Culture   Final    NO GROWTH 2 DAYS Performed at [facility]: [facility], [address] [place], [place] [address]    Report Status PENDING  Incomplete  SARS Coronavirus 2 by RT PCR (hospital order, performed in [facility] hospital lab) *cepheid single result test* Anterior Nasal Swab     Status: None   Collection Time: 06/04/23 12:00 AM   Specimen: Anterior Nasal Swab  Result Value Ref Range Status   SARS Coronavirus 2 by RT PCR NEGATIVE NEGATIVE Final    Comment: Performed at [facility], [address], [place], [place] [address]  Blood Culture  (routine x 2)     Status: None (Preliminary result)   Collection Time: 06/04/23 12:13 AM   Specimen: BLOOD  Result Value Ref Range Status   Specimen Description BLOOD SITE NOT SPECIFIED  Final   Special Requests   Final    BOTTLES DRAWN AEROBIC AND ANAEROBIC Blood Culture adequate volume   Culture   Final    NO GROWTH 2 DAYS Performed at [facility], [address], [place], [place] [address]    Report Status PENDING  Incomplete    FURTHER DISCHARGE INSTRUCTIONS:  Get Medicines reviewed and adjusted: Please take all your medications with you for your next visit with your Primary MD  Laboratory/radiological data: Please request your Primary MD to go over all hospital tests and procedure/radiological results at the follow up, please ask your Primary MD to get all Hospital records sent to his/her office.  In some cases, they will be blood work, cultures and biopsy results pending at the time of your discharge. Please request that your primary care M.D. goes through all the records of your hospital data and follows up on these results.  Also Note the following: If you experience worsening of your admission symptoms, develop shortness of breath, life threatening emergency, suicidal or homicidal thoughts you must seek medical attention immediately by calling 911 or calling your MD immediately  if symptoms less severe.  You must read complete instructions/literature along with all the possible adverse reactions/side effects for all the Medicines you take and that have been prescribed to you. Take any new Medicines after you have completely understood and accpet all the possible adverse reactions/side effects.   Do not drive when taking Pain medications or sleeping medications (Benzodaizepines)  Do not take more  than prescribed Pain, Sleep and Anxiety Medications. It is not advisable to combine anxiety,sleep and pain medications without talking with your primary care  practitioner  Special Instructions: If you have smoked or chewed Tobacco  in the last 2 yrs please stop smoking, stop any regular Alcohol  and or any Recreational drug use.  Wear Seat belts while driving.  Please note: You were cared for by a hospitalist during your hospital stay. Once you are discharged, your primary care physician will handle any further medical issues. Please note that NO REFILLS for any discharge medications will be authorized once you are discharged, as it is imperative that you return to your primary care physician (or establish a relationship with a primary care physician if you do not have one) for your post hospital discharge needs so that they can reassess your need for medications and monitor your lab values.  Total Time spent coordinating discharge including counseling, education and face to face time equals greater than 30 minutes.  SignedJeoffrey Massed 06/06/2023 12:09 PM

## 2023-06-06 NOTE — Progress Notes (Signed)
Patient ID: Betty [name], female   DOB: 1946-01-17, 77 y.o.   MRN: [id number]      Subjective: No abdominal pain. Tolerating diet. ROS negative except as listed above. Objective: Vital signs in last 24 hours: Temp:  [97.6 F (36.4 C)-98.9 F (37.2 C)] 97.6 F (36.4 C) (08/01 0814) Pulse Rate:  [80-100] 80 (08/01 0303) Resp:  [15-20] 18 (08/01 0814) BP: (97-154)/(54-74) 148/74 (08/01 0814) SpO2:  [92 %-98 %] 98 % (08/01 0303)    Intake/Output from previous day: 07/31 0701 - 08/01 0700 In: 1353 [I.V.:1253; IV Piggyback:100] Out: -  Intake/Output this shift: No intake/output data recorded.  General appearance: alert and cooperative Resp: clear to auscultation bilaterally GI: soft, NT, ND  Lab Results: CBC  Recent Labs    06/04/23 0439 06/05/23 0550  WBC 14.9* 14.1*  HGB 8.7* 10.6*  HCT 26.5* 31.4*  PLT 235 263   BMET Recent Labs    06/04/23 0439 06/05/23 0550  NA 140 143  K 3.9 3.8  CL 108 105  CO2 22 24  GLUCOSE 122* 65*  BUN 27* 22  CREATININE 3.08* 2.80*  CALCIUM 8.4* 9.0   PT/INR Recent Labs    06/03/23 2317 06/04/23 0439  LABPROT 15.1 15.4*  INR 1.2 1.2   ABG No results for input(s): "PHART", "HCO3" in the last 72 hours.  Invalid input(s): "PCO2", "PO2"  Studies/Results: No results found.  Anti-infectives: Anti-infectives (From admission, onward)    Start     Dose/Rate Route Frequency Ordered Stop   06/04/23 2200  cefTRIAXone (ROCEPHIN) 1 g in sodium chloride 0.9 % 100 mL IVPB  Status:  Discontinued        1 g 200 mL/hr over 30 Minutes Intravenous Every 24 hours 06/04/23 0414 06/04/23 0938   06/04/23 2200  cefTRIAXone (ROCEPHIN) 2 g in sodium chloride 0.9 % 100 mL IVPB        2 g 200 mL/hr over 30 Minutes Intravenous Every 24 hours 06/04/23 0938     06/04/23 1000  metroNIDAZOLE (FLAGYL) IVPB 500 mg        500 mg 100 mL/hr over 60 Minutes Intravenous Every 12 hours 06/04/23 0414     06/04/23 0015  ceFEPIme (MAXIPIME) 2 g in sodium  chloride 0.9 % 100 mL IVPB        2 g 200 mL/hr over 30 Minutes Intravenous  Once 06/04/23 0001 06/04/23 0101   06/04/23 0015  metroNIDAZOLE (FLAGYL) IVPB 500 mg        500 mg 100 mL/hr over 60 Minutes Intravenous  Once 06/04/23 0001 06/04/23 0203   06/04/23 0015  vancomycin (VANCOCIN) IVPB 1000 mg/200 mL premix        1,000 mg 200 mL/hr over 60 Minutes Intravenous  Once 06/04/23 0001 06/04/23 0303   06/04/23 0000  cefTRIAXone (ROCEPHIN) 2 g in sodium chloride 0.9 % 100 mL IVPB  Status:  Discontinued        2 g 200 mL/hr over 30 Minutes Intravenous Every 24 hours 06/03/23 2353 06/04/23 0000       Assessment/Plan: Appendicitis vs Right adnexal mass  - imaging unclear for appendicitis vs right adnexal process - recommending MRI, pt unable to get MRI because one of her leads is not MRI compatible - tumor markers negative - doing very well and I D/W Dr. Jerral Ralph. Plan D/C home today on Augmentin. We will F/U in the office in 2 weeks and plan F/U imaging at that time.   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

## 2023-06-06 NOTE — Plan of Care (Signed)

## 2023-06-06 NOTE — Progress Notes (Signed)
This patient refused for her lab works this morning. MD notified

## 2023-06-06 NOTE — Progress Notes (Signed)
BSC and TOC meds have been delivered to room. Pt son is on the way to p/u pt to transport home.

## 2023-06-10 ENCOUNTER — Other Ambulatory Visit: Payer: Self-pay

## 2023-06-10 MED ORDER — METOPROLOL SUCCINATE ER 50 MG PO TB24: 50.0000 mg | ORAL_TABLET | Freq: Every day | ORAL | 3 refills | Status: DC

## 2023-06-17 DIAGNOSIS — N185 Chronic kidney disease, stage 5: Secondary | ICD-10-CM | POA: Diagnosis not present

## 2023-06-17 DIAGNOSIS — E872 Acidosis, unspecified: Secondary | ICD-10-CM | POA: Diagnosis not present

## 2023-06-17 DIAGNOSIS — N2581 Secondary hyperparathyroidism of renal origin: Secondary | ICD-10-CM | POA: Diagnosis not present

## 2023-06-17 DIAGNOSIS — D631 Anemia in chronic kidney disease: Secondary | ICD-10-CM | POA: Diagnosis not present

## 2023-06-17 DIAGNOSIS — I120 Hypertensive chronic kidney disease with stage 5 chronic kidney disease or end stage renal disease: Secondary | ICD-10-CM | POA: Diagnosis not present

## 2023-06-18 DIAGNOSIS — I5022 Chronic systolic (congestive) heart failure: Secondary | ICD-10-CM | POA: Diagnosis not present

## 2023-06-18 DIAGNOSIS — N281 Cyst of kidney, acquired: Secondary | ICD-10-CM | POA: Diagnosis not present

## 2023-06-18 DIAGNOSIS — G9341 Metabolic encephalopathy: Secondary | ICD-10-CM | POA: Diagnosis not present

## 2023-06-18 DIAGNOSIS — F01518 Vascular dementia, unspecified severity, with other behavioral disturbance: Secondary | ICD-10-CM | POA: Diagnosis not present

## 2023-06-18 DIAGNOSIS — N184 Chronic kidney disease, stage 4 (severe): Secondary | ICD-10-CM | POA: Diagnosis not present

## 2023-06-18 DIAGNOSIS — I25118 Atherosclerotic heart disease of native coronary artery with other forms of angina pectoris: Secondary | ICD-10-CM | POA: Diagnosis not present

## 2023-06-24 ENCOUNTER — Other Ambulatory Visit: Payer: Self-pay

## 2023-06-24 ENCOUNTER — Encounter (HOSPITAL_COMMUNITY): Payer: Self-pay

## 2023-06-24 ENCOUNTER — Emergency Department (HOSPITAL_COMMUNITY): Payer: Medicare Other

## 2023-06-24 ENCOUNTER — Inpatient Hospital Stay (HOSPITAL_COMMUNITY)
Admission: EM | Admit: 2023-06-24 | Discharge: 2023-07-08 | DRG: 372 | Disposition: A | Discharge status: Skilled Nursing Facility | Payer: Medicare Other | Attending: Internal Medicine | Admitting: Internal Medicine

## 2023-06-24 DIAGNOSIS — N179 Acute kidney failure, unspecified: Secondary | ICD-10-CM | POA: Diagnosis not present

## 2023-06-24 DIAGNOSIS — R159 Full incontinence of feces: Secondary | ICD-10-CM | POA: Diagnosis present

## 2023-06-24 DIAGNOSIS — Z8249 Family history of ischemic heart disease and other diseases of the circulatory system: Secondary | ICD-10-CM

## 2023-06-24 DIAGNOSIS — G9389 Other specified disorders of brain: Secondary | ICD-10-CM | POA: Diagnosis not present

## 2023-06-24 DIAGNOSIS — Z79899 Other long term (current) drug therapy: Secondary | ICD-10-CM

## 2023-06-24 DIAGNOSIS — Z882 Allergy status to sulfonamides status: Secondary | ICD-10-CM | POA: Diagnosis not present

## 2023-06-24 DIAGNOSIS — Z85118 Personal history of other malignant neoplasm of bronchus and lung: Secondary | ICD-10-CM

## 2023-06-24 DIAGNOSIS — Z9221 Personal history of antineoplastic chemotherapy: Secondary | ICD-10-CM | POA: Diagnosis not present

## 2023-06-24 DIAGNOSIS — I2510 Atherosclerotic heart disease of native coronary artery without angina pectoris: Secondary | ICD-10-CM | POA: Diagnosis present

## 2023-06-24 DIAGNOSIS — Z7189 Other specified counseling: Secondary | ICD-10-CM | POA: Diagnosis not present

## 2023-06-24 DIAGNOSIS — R29898 Other symptoms and signs involving the musculoskeletal system: Secondary | ICD-10-CM | POA: Diagnosis not present

## 2023-06-24 DIAGNOSIS — C3490 Malignant neoplasm of unspecified part of unspecified bronchus or lung: Secondary | ICD-10-CM | POA: Diagnosis not present

## 2023-06-24 DIAGNOSIS — Z950 Presence of cardiac pacemaker: Secondary | ICD-10-CM | POA: Diagnosis not present

## 2023-06-24 DIAGNOSIS — F0390 Unspecified dementia without behavioral disturbance: Secondary | ICD-10-CM | POA: Diagnosis not present

## 2023-06-24 DIAGNOSIS — N185 Chronic kidney disease, stage 5: Secondary | ICD-10-CM | POA: Diagnosis present

## 2023-06-24 DIAGNOSIS — I132 Hypertensive heart and chronic kidney disease with heart failure and with stage 5 chronic kidney disease, or end stage renal disease: Secondary | ICD-10-CM | POA: Diagnosis not present

## 2023-06-24 DIAGNOSIS — I5042 Chronic combined systolic (congestive) and diastolic (congestive) heart failure: Secondary | ICD-10-CM | POA: Diagnosis present

## 2023-06-24 DIAGNOSIS — I252 Old myocardial infarction: Secondary | ICD-10-CM | POA: Diagnosis not present

## 2023-06-24 DIAGNOSIS — R918 Other nonspecific abnormal finding of lung field: Secondary | ICD-10-CM | POA: Diagnosis not present

## 2023-06-24 DIAGNOSIS — I671 Cerebral aneurysm, nonruptured: Secondary | ICD-10-CM | POA: Diagnosis not present

## 2023-06-24 DIAGNOSIS — R911 Solitary pulmonary nodule: Secondary | ICD-10-CM | POA: Diagnosis present

## 2023-06-24 DIAGNOSIS — E7800 Pure hypercholesterolemia, unspecified: Secondary | ICD-10-CM | POA: Diagnosis present

## 2023-06-24 DIAGNOSIS — N390 Urinary tract infection, site not specified: Secondary | ICD-10-CM | POA: Diagnosis not present

## 2023-06-24 DIAGNOSIS — Z7982 Long term (current) use of aspirin: Secondary | ICD-10-CM | POA: Diagnosis not present

## 2023-06-24 DIAGNOSIS — R531 Weakness: Secondary | ICD-10-CM | POA: Diagnosis not present

## 2023-06-24 DIAGNOSIS — Z515 Encounter for palliative care: Secondary | ICD-10-CM | POA: Diagnosis not present

## 2023-06-24 DIAGNOSIS — R0989 Other specified symptoms and signs involving the circulatory and respiratory systems: Secondary | ICD-10-CM | POA: Diagnosis not present

## 2023-06-24 DIAGNOSIS — Z66 Do not resuscitate: Secondary | ICD-10-CM | POA: Diagnosis not present

## 2023-06-24 DIAGNOSIS — K219 Gastro-esophageal reflux disease without esophagitis: Secondary | ICD-10-CM | POA: Diagnosis present

## 2023-06-24 DIAGNOSIS — M47814 Spondylosis without myelopathy or radiculopathy, thoracic region: Secondary | ICD-10-CM | POA: Diagnosis not present

## 2023-06-24 DIAGNOSIS — I700 Atherosclerosis of aorta: Secondary | ICD-10-CM | POA: Diagnosis not present

## 2023-06-24 DIAGNOSIS — Z902 Acquired absence of lung [part of]: Secondary | ICD-10-CM

## 2023-06-24 DIAGNOSIS — M47816 Spondylosis without myelopathy or radiculopathy, lumbar region: Secondary | ICD-10-CM | POA: Diagnosis not present

## 2023-06-24 DIAGNOSIS — J439 Emphysema, unspecified: Secondary | ICD-10-CM | POA: Diagnosis present

## 2023-06-24 DIAGNOSIS — Z86010 Personal history of colonic polyps: Secondary | ICD-10-CM

## 2023-06-24 DIAGNOSIS — Z87891 Personal history of nicotine dependence: Secondary | ICD-10-CM

## 2023-06-24 DIAGNOSIS — R32 Unspecified urinary incontinence: Secondary | ICD-10-CM | POA: Diagnosis present

## 2023-06-24 DIAGNOSIS — I672 Cerebral atherosclerosis: Secondary | ICD-10-CM | POA: Diagnosis not present

## 2023-06-24 DIAGNOSIS — K3532 Acute appendicitis with perforation and localized peritonitis, without abscess: Principal | ICD-10-CM | POA: Diagnosis present

## 2023-06-24 DIAGNOSIS — R413 Other amnesia: Secondary | ICD-10-CM | POA: Diagnosis not present

## 2023-06-24 DIAGNOSIS — K449 Diaphragmatic hernia without obstruction or gangrene: Secondary | ICD-10-CM | POA: Diagnosis not present

## 2023-06-24 DIAGNOSIS — I10 Essential (primary) hypertension: Secondary | ICD-10-CM | POA: Diagnosis not present

## 2023-06-24 DIAGNOSIS — R0602 Shortness of breath: Secondary | ICD-10-CM | POA: Diagnosis not present

## 2023-06-24 DIAGNOSIS — K8020 Calculus of gallbladder without cholecystitis without obstruction: Secondary | ICD-10-CM | POA: Diagnosis not present

## 2023-06-24 LAB — TROPONIN I (HIGH SENSITIVITY)
Troponin I (High Sensitivity): 13 ng/L (ref <18)
Troponin I (High Sensitivity): 13 ng/L (ref <18)

## 2023-06-24 LAB — BASIC METABOLIC PANEL
Anion gap: 13 (ref 5–15)
BUN: 26 mg/dL — ABNORMAL HIGH (ref 8–23)
CO2: 22 mmol/L (ref 22–32)
Calcium: 8.9 mg/dL (ref 8.9–10.3)
Chloride: 108 mmol/L (ref 98–111)
Creatinine, Ser: 3.14 mg/dL — ABNORMAL HIGH (ref 0.44–1.00)
GFR, Estimated: 15 mL/min — ABNORMAL LOW (ref >60)
Glucose, Bld: 110 mg/dL — ABNORMAL HIGH (ref 70–99)
Potassium: 4 mmol/L (ref 3.5–5.1)
Sodium: 143 mmol/L (ref 135–145)

## 2023-06-24 LAB — URINALYSIS, ROUTINE W REFLEX MICROSCOPIC
Bilirubin Urine: NEGATIVE
Glucose, UA: NEGATIVE mg/dL
Hgb urine dipstick: NEGATIVE
Ketones, ur: NEGATIVE mg/dL
Nitrite: NEGATIVE
Protein, ur: 30 mg/dL — AB
Specific Gravity, Urine: 1.011 (ref 1.005–1.030)
pH: 6 (ref 5.0–8.0)

## 2023-06-24 LAB — CBC
HCT: 32.3 % — ABNORMAL LOW (ref 36.0–46.0)
Hemoglobin: 10.7 g/dL — ABNORMAL LOW (ref 12.0–15.0)
MCH: 31 pg (ref 26.0–34.0)
MCHC: 33.1 g/dL (ref 30.0–36.0)
MCV: 93.6 fL (ref 80.0–100.0)
Platelets: 364 10*3/uL (ref 150–400)
RBC: 3.45 MIL/uL — ABNORMAL LOW (ref 3.87–5.11)
RDW: 12.9 % (ref 11.5–15.5)
WBC: 12.2 10*3/uL — ABNORMAL HIGH (ref 4.0–10.5)
nRBC: 0 % (ref 0.0–0.2)

## 2023-06-24 NOTE — ED Triage Notes (Signed)
Pt c/o SOB and right leg weakness started around noon yesterday. Pt is eupneic.

## 2023-06-24 NOTE — ED Notes (Signed)
Pt attempted urine collection. Could not obtain at this time.

## 2023-06-24 NOTE — ED Provider Triage Note (Signed)
Emergency Medicine Provider Triage Evaluation Note  Betty [name] , a 77 y.o. female  was evaluated in triage.  Pt complains of right leg weakness for past week.  Denies any dizziness, difficulty walking, falls, any other weakness.  She denies any chest pain or shortness of breath.  Denies any changes in vision.  Review of Systems  Positive: As above Negative: As above  Physical Exam  BP 111/60 (BP Location: Right Arm)   Pulse 75   Temp 98.1 F (36.7 C) (Oral)   Resp 20   Ht 5\' 4"  (1.626 m)   Wt 55.8 kg   SpO2 97%   BMI 21.12 kg/m  Gen:   Awake, no distress   Resp:  Normal effort  MSK:   Moves extremities without difficulty  Other:  Cranial nerves III through XII intact 5 out of 5 strength in the lower extremities bilaterally  Medical Decision Making  Medically screening exam initiated at 3:03 PM.  Appropriate orders placed.  [name] was informed that the remainder of the evaluation will be completed by another provider, this initial triage assessment does not replace that evaluation, and the importance of remaining in the ED until their evaluation is complete.     Arabella Merles, PA-C 06/24/23 [phone]

## 2023-06-24 NOTE — ED Provider Notes (Signed)
[place] EMERGENCY DEPARTMENT AT [facility] Provider Note   CSN: [id number] Arrival date & time: 06/24/23  1348     History {Add pertinent medical, surgical, social history, OB history to HPI:1} Chief Complaint  Patient presents with   Shortness of Breath   Extremity Weakness    Betty [name] is a 77 y.o. female.  The history is provided by the patient and medical records.  Shortness of Breath Extremity Weakness Associated symptoms include shortness of breath.   77 year old female with history of hypertension, dementia, chronic kidney disease, coronary artery disease, non-small cell carcinoma of lung, hypercholesterolemia, presenting to the ED for various complaints.  Patient states that she is "fine" and does not need anything.  Son reports he went to check on her today and found her sitting on the couch but was covered in urine and feces as if she had not been getting up to go to the bathroom.  He was able to get her off the couch and she was able to walk to the bathroom but seemed a little unsteady on her feet and seem to get winded with walking.  States she lives alone and is normally fairly self-sufficient.  She does eat, but usually once or twice a day, she does not do well drinking fluids.  She has not had any vomiting.  Son also reports they do have concern over recent mass that was found last month and her abdomen, questionably appendiceal versus ovarian.  It was recommended that she have MRI of her abdomen, however her pacemaker is not compatible so this was unable to be done.  She completed 2 weeks of Augmentin and is due to follow-up with surgery later this month.  Home Medications Prior to Admission medications   Medication Sig Start Date End Date Taking? Authorizing Provider  acetaminophen (TYLENOL) 325 MG tablet Take 650 mg by mouth every 6 (six) hours as needed for mild pain or moderate pain.    [provider]  aspirin EC 81 MG tablet Take 81 mg by  mouth at bedtime.    [provider]  atorvastatin (LIPITOR) 40 MG tablet TAKE 1 TABLET BY MOUTH EVERY DAY AT 6 PM 07/23/22   Lyn Records, MD  cetirizine (ZYRTEC) 10 MG tablet Take 10 mg by mouth daily.    [provider]  Cholecalciferol (VITAMIN D) 2000 units tablet Take 1,000 Units by mouth at bedtime.     [provider]  donepezil (ARICEPT) 10 MG tablet Take 1 tablet (10 mg total) by mouth at bedtime. 12/14/21   Glean Salvo, NP  febuxostat (ULORIC) 40 MG tablet Take 40 mg by mouth at bedtime. 03/27/23   [provider]  hydrALAZINE (APRESOLINE) 50 MG tablet TAKE 1 TABLET(50 MG) BY MOUTH THREE TIMES DAILY 07/13/22   Lyn Records, MD  isosorbide dinitrate (ISORDIL) 20 MG tablet TAKE 1 TABLET BY MOUTH THREE TIMES DAILY 08/08/22   Lyn Records, MD  memantine (NAMENDA) 10 MG tablet Take 1 tablet (10 mg total) by mouth 2 (two) times daily. 06/28/22   Glean Salvo, NP  metoprolol succinate (TOPROL-XL) 50 MG 24 hr tablet Take 1 tablet (50 mg total) by mouth daily. Take with or immediately following a meal. 06/10/23   Meriam Sprague, MD  Multiple Vitamin (MULTIVITAMIN WITH MINERALS) TABS tablet Take 0.5 tablets by mouth at bedtime.    [provider]  nitroGLYCERIN (NITROSTAT) 0.4 MG SL tablet Place 1 tablet (0.4 mg  total) under the tongue every 5 (five) minutes x 3 doses as needed for chest pain. 04/26/18   Berton Bon, NP  pantoprazole (PROTONIX) 40 MG tablet Take 40 mg by mouth every morning. 03/14/23   [provider]  QUEtiapine (SEROQUEL) 25 MG tablet Take 25 mg by mouth at bedtime. 05/28/23   [provider]  sodium bicarbonate 650 MG tablet Take 1 tablet (650 mg total) by mouth 2 (two) times daily. 07/02/19   Marguerita Merles Latif, DO      Allergies    Sulfa antibiotics    Review of Systems   Review of Systems  Respiratory:  Positive for shortness of breath.   Musculoskeletal:  Positive for extremity weakness.  All other  systems reviewed and are negative.   Physical Exam Updated Vital Signs BP 134/66   Pulse 64   Temp 97.8 F (36.6 C) (Oral)   Resp 18   Ht 5\' 4"  (1.626 m)   Wt 55.8 kg   SpO2 98%   BMI 21.12 kg/m  Physical Exam Vitals and nursing note reviewed.  Constitutional:      Appearance: She is well-developed.  HENT:     Head: Normocephalic and atraumatic.  Eyes:     Conjunctiva/sclera: Conjunctivae normal.     Pupils: Pupils are equal, round, and reactive to light.  Cardiovascular:     Rate and Rhythm: Normal rate and regular rhythm.     Heart sounds: Normal heart sounds.  Pulmonary:     Effort: Pulmonary effort is normal.     Breath sounds: Normal breath sounds. No wheezing or rhonchi.  Abdominal:     General: Bowel sounds are normal.     Palpations: Abdomen is soft.  Musculoskeletal:        General: Normal range of motion.     Cervical back: Normal range of motion.  Skin:    General: Skin is warm and dry.  Neurological:     Mental Status: She is alert and oriented to person, place, and time.     ED Results / Procedures / Treatments   Labs (all labs ordered are listed, but only abnormal results are displayed) Labs Reviewed  BASIC METABOLIC PANEL - Abnormal; Notable for the following components:      Result Value   Glucose, Bld 110 (*)    BUN 26 (*)    Creatinine, Ser 3.14 (*)    GFR, Estimated 15 (*)    All other components within normal limits  CBC - Abnormal; Notable for the following components:   WBC 12.2 (*)    RBC 3.45 (*)    Hemoglobin 10.7 (*)    HCT 32.3 (*)    All other components within normal limits  URINALYSIS, ROUTINE W REFLEX MICROSCOPIC - Abnormal; Notable for the following components:   APPearance HAZY (*)    Protein, ur 30 (*)    Leukocytes,Ua MODERATE (*)    Bacteria, UA FEW (*)    Non Squamous Epithelial 0-5 (*)    All other components within normal limits  TROPONIN I (HIGH SENSITIVITY)  TROPONIN I (HIGH SENSITIVITY)    EKG EKG  Interpretation Date/Time:  Monday June 24 2023 14:17:58 EDT Ventricular Rate:  72 PR Interval:  120 QRS Duration:  160 QT Interval:  498 QTC Calculation: 545 R Axis:   165  Text Interpretation: Atrial-sensed ventricular-paced rhythm Abnormal ECG Confirmed by Tilden Fossa [id number]) on 06/24/2023 11:05:31 PM  Radiology DG Chest 2 View  Result Date: 06/24/2023 CLINICAL  DATA:  Shortness of breath and extremity weakness EXAM: CHEST - 2 VIEW COMPARISON:  Chest radiograph dated 06/03/2023 FINDINGS: Lines/tubes: Left chest wall pacemaker leads project over the right atrium and ventricle and tributary of the coronary sinus. Lungs: Patient is rotated slightly to the right. Unchanged elevation of the right hemidiaphragm. Slightly low lung volumes. No focal consolidations. Patchy left basilar opacities. Pleura: No pneumothorax or pleural effusion. Heart/mediastinum: The heart size and mediastinal contours are within normal limits. Bones: No acute osseous abnormality. IMPRESSION: Slightly low lung volumes with patchy left basilar opacities, which may represent atelectasis, aspiration, or pneumonia. Electronically Signed   By: Agustin Cree M.D.   On: 06/24/2023 16:33    Procedures Procedures  {Document cardiac monitor, telemetry assessment procedure when appropriate:1}  Medications Ordered in ED Medications - No data to display  ED Course/ Medical Decision Making/ A&P   {   Click here for ABCD2, HEART and other calculatorsREFRESH Note before signing :1}                              Medical Decision Making Amount and/or Complexity of Data Reviewed Labs: ordered. Radiology: ordered and independent interpretation performed. ECG/medicine tests: ordered and independent interpretation performed.    Final Clinical Impression(s) / ED Diagnoses Final diagnoses:  None    Rx / DC Orders ED Discharge Orders     None

## 2023-06-25 ENCOUNTER — Emergency Department (HOSPITAL_COMMUNITY): Payer: Medicare Other

## 2023-06-25 DIAGNOSIS — M47814 Spondylosis without myelopathy or radiculopathy, thoracic region: Secondary | ICD-10-CM | POA: Diagnosis not present

## 2023-06-25 DIAGNOSIS — I671 Cerebral aneurysm, nonruptured: Secondary | ICD-10-CM | POA: Diagnosis present

## 2023-06-25 DIAGNOSIS — I700 Atherosclerosis of aorta: Secondary | ICD-10-CM | POA: Diagnosis not present

## 2023-06-25 DIAGNOSIS — R1900 Intra-abdominal and pelvic swelling, mass and lump, unspecified site: Secondary | ICD-10-CM | POA: Diagnosis not present

## 2023-06-25 DIAGNOSIS — N185 Chronic kidney disease, stage 5: Secondary | ICD-10-CM | POA: Diagnosis not present

## 2023-06-25 DIAGNOSIS — R29898 Other symptoms and signs involving the musculoskeletal system: Secondary | ICD-10-CM | POA: Diagnosis not present

## 2023-06-25 DIAGNOSIS — K3532 Acute appendicitis with perforation and localized peritonitis, without abscess: Secondary | ICD-10-CM | POA: Diagnosis not present

## 2023-06-25 DIAGNOSIS — I132 Hypertensive heart and chronic kidney disease with heart failure and with stage 5 chronic kidney disease, or end stage renal disease: Secondary | ICD-10-CM | POA: Diagnosis present

## 2023-06-25 DIAGNOSIS — E7800 Pure hypercholesterolemia, unspecified: Secondary | ICD-10-CM | POA: Diagnosis present

## 2023-06-25 DIAGNOSIS — N184 Chronic kidney disease, stage 4 (severe): Secondary | ICD-10-CM | POA: Diagnosis not present

## 2023-06-25 DIAGNOSIS — Z882 Allergy status to sulfonamides status: Secondary | ICD-10-CM | POA: Diagnosis not present

## 2023-06-25 DIAGNOSIS — C3490 Malignant neoplasm of unspecified part of unspecified bronchus or lung: Secondary | ICD-10-CM | POA: Diagnosis not present

## 2023-06-25 DIAGNOSIS — K8020 Calculus of gallbladder without cholecystitis without obstruction: Secondary | ICD-10-CM | POA: Diagnosis not present

## 2023-06-25 DIAGNOSIS — M109 Gout, unspecified: Secondary | ICD-10-CM | POA: Diagnosis not present

## 2023-06-25 DIAGNOSIS — E782 Mixed hyperlipidemia: Secondary | ICD-10-CM | POA: Diagnosis not present

## 2023-06-25 DIAGNOSIS — R1311 Dysphagia, oral phase: Secondary | ICD-10-CM | POA: Diagnosis not present

## 2023-06-25 DIAGNOSIS — Z902 Acquired absence of lung [part of]: Secondary | ICD-10-CM | POA: Diagnosis not present

## 2023-06-25 DIAGNOSIS — I672 Cerebral atherosclerosis: Secondary | ICD-10-CM | POA: Diagnosis not present

## 2023-06-25 DIAGNOSIS — J439 Emphysema, unspecified: Secondary | ICD-10-CM | POA: Diagnosis present

## 2023-06-25 DIAGNOSIS — Z8249 Family history of ischemic heart disease and other diseases of the circulatory system: Secondary | ICD-10-CM | POA: Diagnosis not present

## 2023-06-25 DIAGNOSIS — Z85118 Personal history of other malignant neoplasm of bronchus and lung: Secondary | ICD-10-CM | POA: Diagnosis not present

## 2023-06-25 DIAGNOSIS — M6281 Muscle weakness (generalized): Secondary | ICD-10-CM | POA: Diagnosis not present

## 2023-06-25 DIAGNOSIS — K449 Diaphragmatic hernia without obstruction or gangrene: Secondary | ICD-10-CM | POA: Diagnosis not present

## 2023-06-25 DIAGNOSIS — I10 Essential (primary) hypertension: Secondary | ICD-10-CM | POA: Diagnosis not present

## 2023-06-25 DIAGNOSIS — Z950 Presence of cardiac pacemaker: Secondary | ICD-10-CM | POA: Diagnosis not present

## 2023-06-25 DIAGNOSIS — K3533 Acute appendicitis with perforation and localized peritonitis, with abscess: Secondary | ICD-10-CM | POA: Diagnosis not present

## 2023-06-25 DIAGNOSIS — R918 Other nonspecific abnormal finding of lung field: Secondary | ICD-10-CM | POA: Diagnosis not present

## 2023-06-25 DIAGNOSIS — K219 Gastro-esophageal reflux disease without esophagitis: Secondary | ICD-10-CM | POA: Diagnosis not present

## 2023-06-25 DIAGNOSIS — F0390 Unspecified dementia without behavioral disturbance: Secondary | ICD-10-CM | POA: Diagnosis present

## 2023-06-25 DIAGNOSIS — R413 Other amnesia: Secondary | ICD-10-CM | POA: Diagnosis not present

## 2023-06-25 DIAGNOSIS — Z9221 Personal history of antineoplastic chemotherapy: Secondary | ICD-10-CM | POA: Diagnosis not present

## 2023-06-25 DIAGNOSIS — Z87891 Personal history of nicotine dependence: Secondary | ICD-10-CM | POA: Diagnosis not present

## 2023-06-25 DIAGNOSIS — I5022 Chronic systolic (congestive) heart failure: Secondary | ICD-10-CM | POA: Diagnosis not present

## 2023-06-25 DIAGNOSIS — I2510 Atherosclerotic heart disease of native coronary artery without angina pectoris: Secondary | ICD-10-CM | POA: Diagnosis not present

## 2023-06-25 DIAGNOSIS — I252 Old myocardial infarction: Secondary | ICD-10-CM | POA: Diagnosis not present

## 2023-06-25 DIAGNOSIS — Z79899 Other long term (current) drug therapy: Secondary | ICD-10-CM | POA: Diagnosis not present

## 2023-06-25 DIAGNOSIS — M47816 Spondylosis without myelopathy or radiculopathy, lumbar region: Secondary | ICD-10-CM | POA: Diagnosis not present

## 2023-06-25 DIAGNOSIS — Z66 Do not resuscitate: Secondary | ICD-10-CM | POA: Diagnosis not present

## 2023-06-25 DIAGNOSIS — R2689 Other abnormalities of gait and mobility: Secondary | ICD-10-CM | POA: Diagnosis not present

## 2023-06-25 DIAGNOSIS — Z7189 Other specified counseling: Secondary | ICD-10-CM | POA: Diagnosis not present

## 2023-06-25 DIAGNOSIS — N179 Acute kidney failure, unspecified: Secondary | ICD-10-CM | POA: Diagnosis not present

## 2023-06-25 DIAGNOSIS — R531 Weakness: Secondary | ICD-10-CM | POA: Diagnosis not present

## 2023-06-25 DIAGNOSIS — Z515 Encounter for palliative care: Secondary | ICD-10-CM | POA: Diagnosis not present

## 2023-06-25 DIAGNOSIS — I5042 Chronic combined systolic (congestive) and diastolic (congestive) heart failure: Secondary | ICD-10-CM | POA: Diagnosis present

## 2023-06-25 DIAGNOSIS — Z7982 Long term (current) use of aspirin: Secondary | ICD-10-CM | POA: Diagnosis not present

## 2023-06-25 DIAGNOSIS — G9389 Other specified disorders of brain: Secondary | ICD-10-CM | POA: Diagnosis not present

## 2023-06-25 DIAGNOSIS — I120 Hypertensive chronic kidney disease with stage 5 chronic kidney disease or end stage renal disease: Secondary | ICD-10-CM | POA: Diagnosis not present

## 2023-06-25 DIAGNOSIS — Z86010 Personal history of colonic polyps: Secondary | ICD-10-CM | POA: Diagnosis not present

## 2023-06-25 DIAGNOSIS — N390 Urinary tract infection, site not specified: Secondary | ICD-10-CM | POA: Diagnosis present

## 2023-06-25 MED ORDER — ENOXAPARIN SODIUM 30 MG/0.3ML IJ SOSY
30.0000 mg | PREFILLED_SYRINGE | INTRAMUSCULAR | Status: DC
  Administered 2023-06-25 – 2023-07-06 (×11): 30 mg via SUBCUTANEOUS
  Filled 2023-06-25 (×14): qty 0.3

## 2023-06-25 MED ORDER — QUETIAPINE FUMARATE 50 MG PO TABS
25.0000 mg | ORAL_TABLET | Freq: Every day | ORAL | Status: DC
  Administered 2023-06-25 – 2023-07-07 (×13): 25 mg via ORAL
  Filled 2023-06-25 (×13): qty 1

## 2023-06-25 MED ORDER — MEMANTINE HCL 10 MG PO TABS
10.0000 mg | ORAL_TABLET | Freq: Two times a day (BID) | ORAL | Status: DC
  Administered 2023-06-25 – 2023-07-08 (×27): 10 mg via ORAL
  Filled 2023-06-25 (×29): qty 1

## 2023-06-25 MED ORDER — HYDROCODONE-ACETAMINOPHEN 5-325 MG PO TABS: 1.0000 | ORAL_TABLET | ORAL | Status: DC | PRN

## 2023-06-25 MED ORDER — HYDRALAZINE HCL 50 MG PO TABS
50.0000 mg | ORAL_TABLET | Freq: Three times a day (TID) | ORAL | Status: DC
  Administered 2023-06-25 – 2023-06-30 (×14): 50 mg via ORAL
  Filled 2023-06-25 (×14): qty 1

## 2023-06-25 MED ORDER — LORATADINE 10 MG PO TABS
10.0000 mg | ORAL_TABLET | Freq: Every day | ORAL | Status: DC
  Administered 2023-06-25 – 2023-07-08 (×14): 10 mg via ORAL
  Filled 2023-06-25 (×14): qty 1

## 2023-06-25 MED ORDER — HYDRALAZINE HCL 20 MG/ML IJ SOLN: 5.0000 mg | Freq: Three times a day (TID) | INTRAMUSCULAR | Status: DC | PRN

## 2023-06-25 MED ORDER — VITAMIN D 25 MCG (1000 UNIT) PO TABS
1000.0000 [IU] | ORAL_TABLET | Freq: Every day | ORAL | Status: DC
  Administered 2023-06-25 – 2023-07-07 (×13): 1000 [IU] via ORAL
  Filled 2023-06-25 (×13): qty 1

## 2023-06-25 MED ORDER — SODIUM CHLORIDE 0.9% FLUSH
3.0000 mL | Freq: Two times a day (BID) | INTRAVENOUS | Status: DC
  Administered 2023-06-25 – 2023-07-07 (×18): 3 mL via INTRAVENOUS

## 2023-06-25 MED ORDER — ALBUTEROL SULFATE (2.5 MG/3ML) 0.083% IN NEBU: 2.5000 mg | INHALATION_SOLUTION | Freq: Four times a day (QID) | RESPIRATORY_TRACT | Status: DC | PRN

## 2023-06-25 MED ORDER — IPRATROPIUM BROMIDE 0.02 % IN SOLN: 0.5000 mg | Freq: Four times a day (QID) | RESPIRATORY_TRACT | Status: DC | PRN

## 2023-06-25 MED ORDER — PANTOPRAZOLE SODIUM 40 MG PO TBEC
40.0000 mg | DELAYED_RELEASE_TABLET | Freq: Every morning | ORAL | Status: DC
  Administered 2023-06-25 – 2023-07-08 (×14): 40 mg via ORAL
  Filled 2023-06-25 (×14): qty 1

## 2023-06-25 MED ORDER — ONDANSETRON HCL 4 MG/2ML IJ SOLN: 4.0000 mg | Freq: Four times a day (QID) | INTRAMUSCULAR | Status: DC | PRN

## 2023-06-25 MED ORDER — ACETAMINOPHEN 650 MG RE SUPP: 650.0000 mg | Freq: Four times a day (QID) | RECTAL | Status: DC | PRN

## 2023-06-25 MED ORDER — ISOSORBIDE DINITRATE 10 MG PO TABS
20.0000 mg | ORAL_TABLET | Freq: Three times a day (TID) | ORAL | Status: DC
  Administered 2023-06-25 – 2023-07-08 (×37): 20 mg via ORAL
  Filled 2023-06-25 (×39): qty 2

## 2023-06-25 MED ORDER — METOPROLOL SUCCINATE ER 25 MG PO TB24
50.0000 mg | ORAL_TABLET | Freq: Every day | ORAL | Status: DC
  Administered 2023-06-25 – 2023-07-08 (×14): 50 mg via ORAL
  Filled 2023-06-25 (×14): qty 2

## 2023-06-25 MED ORDER — ASPIRIN 81 MG PO TBEC
81.0000 mg | DELAYED_RELEASE_TABLET | Freq: Every day | ORAL | Status: DC
  Administered 2023-06-25 – 2023-07-07 (×13): 81 mg via ORAL
  Filled 2023-06-25 (×13): qty 1

## 2023-06-25 MED ORDER — SODIUM BICARBONATE 650 MG PO TABS
650.0000 mg | ORAL_TABLET | Freq: Two times a day (BID) | ORAL | Status: DC
  Administered 2023-06-25 – 2023-06-30 (×11): 650 mg via ORAL
  Filled 2023-06-25 (×11): qty 1

## 2023-06-25 MED ORDER — ATORVASTATIN CALCIUM 40 MG PO TABS
40.0000 mg | ORAL_TABLET | Freq: Every day | ORAL | Status: DC
  Administered 2023-06-25 – 2023-07-08 (×14): 40 mg via ORAL
  Filled 2023-06-25 (×14): qty 1

## 2023-06-25 MED ORDER — ADULT MULTIVITAMIN W/MINERALS CH
0.5000 | ORAL_TABLET | Freq: Every day | ORAL | Status: DC
  Administered 2023-06-25 – 2023-07-07 (×13): 0.5 via ORAL
  Filled 2023-06-25 (×13): qty 1

## 2023-06-25 MED ORDER — NITROGLYCERIN 0.4 MG SL SUBL: 0.4000 mg | SUBLINGUAL_TABLET | SUBLINGUAL | Status: DC | PRN

## 2023-06-25 MED ORDER — ACETAMINOPHEN 325 MG PO TABS
650.0000 mg | ORAL_TABLET | Freq: Four times a day (QID) | ORAL | Status: DC | PRN
  Filled 2023-06-25: qty 2

## 2023-06-25 MED ORDER — IPRATROPIUM-ALBUTEROL 0.5-2.5 (3) MG/3ML IN SOLN: 3.0000 mL | Freq: Four times a day (QID) | RESPIRATORY_TRACT | Status: DC | PRN

## 2023-06-25 MED ORDER — FEBUXOSTAT 40 MG PO TABS
40.0000 mg | ORAL_TABLET | Freq: Every day | ORAL | Status: DC
  Administered 2023-06-25 – 2023-07-07 (×13): 40 mg via ORAL
  Filled 2023-06-25 (×15): qty 1

## 2023-06-25 MED ORDER — ONDANSETRON HCL 4 MG PO TABS: 4.0000 mg | ORAL_TABLET | Freq: Four times a day (QID) | ORAL | Status: DC | PRN

## 2023-06-25 MED ORDER — LACTATED RINGERS IV SOLN: INTRAVENOUS | Status: DC

## 2023-06-25 MED ORDER — DONEPEZIL HCL 10 MG PO TABS
10.0000 mg | ORAL_TABLET | Freq: Every day | ORAL | Status: DC
  Administered 2023-06-25 – 2023-07-07 (×13): 10 mg via ORAL
  Filled 2023-06-25 (×12): qty 1

## 2023-06-25 MED ORDER — BISACODYL 5 MG PO TBEC: 5.0000 mg | DELAYED_RELEASE_TABLET | Freq: Every day | ORAL | Status: DC | PRN

## 2023-06-25 MED ORDER — PIPERACILLIN-TAZOBACTAM IN DEX 2-0.25 GM/50ML IV SOLN
2.2500 g | Freq: Three times a day (TID) | INTRAVENOUS | Status: DC
  Administered 2023-06-25 – 2023-06-27 (×7): 2.25 g via INTRAVENOUS
  Filled 2023-06-25 (×10): qty 50

## 2023-06-25 MED ORDER — TRAZODONE HCL 50 MG PO TABS: 25.0000 mg | ORAL_TABLET | Freq: Every evening | ORAL | Status: DC | PRN

## 2023-06-25 MED ORDER — SENNOSIDES-DOCUSATE SODIUM 8.6-50 MG PO TABS: 1.0000 | ORAL_TABLET | Freq: Every evening | ORAL | Status: DC | PRN

## 2023-06-25 MED ORDER — PIPERACILLIN-TAZOBACTAM 3.375 G IVPB 30 MIN
3.3750 g | Freq: Once | INTRAVENOUS | Status: AC
  Administered 2023-06-25: 3.375 g via INTRAVENOUS
  Filled 2023-06-25: qty 50

## 2023-06-25 MED ORDER — MORPHINE SULFATE (PF) 2 MG/ML IV SOLN: 1.0000 mg | Freq: Four times a day (QID) | INTRAVENOUS | Status: DC | PRN

## 2023-06-25 NOTE — Progress Notes (Signed)
IV team consulted for PIV access. Patient refusing. RN notified.

## 2023-06-25 NOTE — Progress Notes (Signed)
Pharmacy Antibiotic Note  Betty [name] is a 77 y.o. female admitted on 06/24/2023 with  IAI .  Pharmacy has been consulted for zosyn dosing.  Plan: Zosyn 2.25 grams iv q8h  Height: 5\' 4"  (162.6 cm) Weight: 55.8 kg (123 lb 0.3 oz) IBW/kg (Calculated) : 54.7  Temp (24hrs), Avg:97.9 F (36.6 C), Min:97.8 F (36.6 C), Max:98.1 F (36.7 C)  Recent Labs  Lab 06/24/23 1425  WBC 12.2*  CREATININE 3.14*    Estimated Creatinine Clearance: 12.8 mL/min (A) (by C-G formula based on SCr of 3.14 mg/dL (H)).    Allergies  Allergen Reactions   Sulfa Antibiotics Itching     Thank you for allowing pharmacy to be a part of this patient's care.  Greta Doom BS, PharmD, BCPS Clinical Pharmacist 06/25/2023 9:46 AM  Contact: [phone] after 3 PM  "Be curious, not judgmental..." -Debbora Dus

## 2023-06-25 NOTE — Consult Note (Signed)
Reason for Consult/Chief Complaint: perforated appendicitis Consultant: Allyne Gee, PA  Betty [name] is an 77 y.o. female.   HPI: 54F with history of dementia and no family at bedside. Per EDP, son brought her in to the ED for weakness. She had a CT scan 2w ago with a questionable ovarian/appendiceal mass. She was given a course of augmentin after this scan. 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 Unable to obtain MRI due to replaced PPM lead that is not MRI compatible. CT scan today suggests perforated appendicitis. Patient reports that she is fine and has no complaints. When prompted, she states that she has had abdominal pain for "one hour" and has taken ibuprofen. She reports that she lives alone.   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)    Arthritis    bursitis in right shoulder (05/21/2018)   Blood transfusion without reported diagnosis    Cerebral aneurysm, nonruptured 07/16/2018   Right anterior communicating artery   CKD (chronic kidney disease), stage IV (HCC)    Colon polyps    COPD (chronic obstructive pulmonary disease) (HCC)    emphysema   Dementia (HCC) 07/16/2018   GERD (gastroesophageal reflux disease)    Hypercholesterolemia    Hypertension    Non-small cell carcinoma of right lung, stage 1 (HCC) 05/17/2015   Stage IB, right upper lobectomy 04/29/15, chemo    NSTEMI (non-ST elevated myocardial infarction) (HCC) 03/24/2018   Presence of permanent cardiac pacemaker 05/21/2018   Pure hypercholesterolemia    Sciatica of right side    Seasonal allergic rhinitis    Tobacco dependence    Tubular adenoma of colon    Vitamin D deficiency     Past Surgical History:  Procedure Laterality Date   BIOPSY  06/26/2019   Procedure: BIOPSY;  Surgeon: Charlott Rakes, MD;  Location: [facility] ENDOSCOPY;  Service: Endoscopy;;   BIV PACEMAKER INSERTION CRT-P N/A 05/21/2018   Procedure:  BIV PACEMAKER INSERTION CRT-P;  Surgeon: Duke Salvia, MD;  Location: [facility] INVASIVE CV LAB;  Service: Cardiovascular;  Laterality: N/A;   BREAST SURGERY     small mass removed from left breast--benign   CRYO INTERCOSTAL NERVE BLOCK Right 04/29/2015   Procedure: CRYO INTERCOSTAL NERVE BLOCK;  Surgeon: Loreli Slot, MD;  Location: [facility] OR;  Service: Thoracic;  Laterality: Right;   ESOPHAGOGASTRODUODENOSCOPY (EGD) WITH PROPOFOL N/A 06/26/2019   Procedure: ESOPHAGOGASTRODUODENOSCOPY (EGD) WITH PROPOFOL;  Surgeon: Charlott Rakes, MD;  Location: [facility] ENDOSCOPY;  Service: Endoscopy;  Laterality: N/A;   INSERT / REPLACE / REMOVE PACEMAKER  05/21/2018   LOBECTOMY Right 04/29/2015   Procedure: RIGHT LOWER LUNG LOBECTOMY ;  Surgeon: Loreli Slot, MD;  Location: [facility] OR;  Service: Thoracic;  Laterality: Right;   LYMPH NODE DISSECTION Right 04/29/2015   Procedure: RIGHT LUNG LYMPH NODE DISSECTION;  Surgeon: Loreli Slot, MD;  Location: [facility] OR;  Service: Thoracic;  Laterality: Right;   RIGHT/LEFT HEART CATH AND CORONARY ANGIOGRAPHY N/A 04/24/2018   Procedure: RIGHT/LEFT HEART CATH AND CORONARY ANGIOGRAPHY;  Surgeon: Runell Gess, MD;  Location: [facility] INVASIVE CV LAB;  Service: Cardiovascular;  Laterality: N/A;   VAGINAL DELIVERY     52 yrs ago   VIDEO ASSISTED THORACOSCOPY (VATS)/ LOBECTOMY Right 04/29/2015   Procedure: RIGHT VIDEO ASSISTED THORACOSCOPY (VATS) WEDGE RESECTION/ RIGHT LOWER LOBECTOMY, CRYO-ANALGESIA OF INTERCOSTAL NERVES;  Surgeon: Loreli Slot, MD;  Location: [facility] OR;  Service: Thoracic;  Laterality: Right;    Family History  Problem Relation Age of Onset   Cancer Father    Cancer Sister    Heart attack Brother    Dementia Brother    Other Son        bicuspid aortic valve   Heart attack Brother    Cancer Sister     Social History:  reports that she quit smoking about 8 years ago. Her smoking use included cigarettes. She started smoking about 55 years ago. She  has a 35.3 pack-year smoking history. She has been exposed to tobacco smoke. She has never used smokeless tobacco. She reports current alcohol use. She reports that she does not use drugs.  Allergies:  Allergies  Allergen Reactions   Sulfa Antibiotics Itching    Medications: I have reviewed the patient's current medications.  Results for orders placed or performed during the hospital encounter of 06/24/23 (from the past 48 hour(s))  Basic metabolic panel     Status: Abnormal   Collection Time: 06/24/23  2:25 PM  Result Value Ref Range   Sodium 143 135 - 145 mmol/L   Potassium 4.0 3.5 - 5.1 mmol/L   Chloride 108 98 - 111 mmol/L   CO2 22 22 - 32 mmol/L   Glucose, Bld 110 (H) 70 - 99 mg/dL    Comment: Glucose reference range applies only to samples taken after fasting for at least 8 hours.   BUN 26 (H) 8 - 23 mg/dL   Creatinine, Ser 6.57 (H) 0.44 - 1.00 mg/dL   Calcium 8.9 8.9 - 84.6 mg/dL   GFR, Estimated 15 (L) >60 mL/min    Comment: (NOTE) Calculated using the CKD-EPI Creatinine Equation (2021)    Anion gap 13 5 - 15    Comment: Performed at [facility], [address], [place], [place] [address]  CBC     Status: Abnormal   Collection Time: 06/24/23  2:25 PM  Result Value Ref Range   WBC 12.2 (H) 4.0 - 10.5 K/uL   RBC 3.45 (L) 3.87 - 5.11 MIL/uL   Hemoglobin 10.7 (L) 12.0 - 15.0 g/dL   HCT 28.4 (L) 13.2 - 44.0 %   MCV 93.6 80.0 - 100.0 fL   MCH 31.0 26.0 - 34.0 pg   MCHC 33.1 30.0 - 36.0 g/dL   RDW 10.2 72.5 - 36.6 %   Platelets 364 150 - 400 K/uL   nRBC 0.0 0.0 - 0.2 %    Comment: Performed at [facility], [address] [place], [place] [address]  Troponin I (High Sensitivity)     Status: None   Collection Time: 06/24/23  2:25 PM  Result Value Ref Range   Troponin I (High Sensitivity) 13 <18 ng/L    Comment: (NOTE) Elevated high sensitivity troponin I (hsTnI) values and significant  changes across serial measurements may suggest ACS but many other   chronic and acute conditions are known to elevate hsTnI results.  Refer to the "Links" section for chest pain algorithms and additional  guidance. Performed at [facility], [address], [place], [place] [address]   Urinalysis, Routine w reflex microscopic -Urine, Clean Catch     Status: Abnormal   Collection Time: 06/24/23  3:13 PM  Result Value Ref Range   Color, Urine YELLOW YELLOW   APPearance HAZY (A) CLEAR   Specific Gravity, Urine 1.011 1.005 - 1.030   pH 6.0 5.0 - 8.0   Glucose,  UA NEGATIVE NEGATIVE mg/dL   Hgb urine dipstick NEGATIVE NEGATIVE   Bilirubin Urine NEGATIVE NEGATIVE   Ketones, ur NEGATIVE NEGATIVE mg/dL   Protein, ur 30 (A) NEGATIVE mg/dL   Nitrite NEGATIVE NEGATIVE   Leukocytes,Ua MODERATE (A) NEGATIVE   RBC / HPF 0-5 0 - 5 RBC/hpf   WBC, UA 11-20 0 - 5 WBC/hpf   Bacteria, UA FEW (A) NONE SEEN   Squamous Epithelial / HPF 6-10 0 - 5 /HPF   Mucus PRESENT    Non Squamous Epithelial 0-5 (A) NONE SEEN    Comment: Performed at [facility], [address], [place], [place] [address]  Troponin I (High Sensitivity)     Status: None   Collection Time: 06/24/23 10:47 PM  Result Value Ref Range   Troponin I (High Sensitivity) 13 <18 ng/L    Comment: (NOTE) Elevated high sensitivity troponin I (hsTnI) values and significant  changes across serial measurements may suggest ACS but many other  chronic and acute conditions are known to elevate hsTnI results.  Refer to the "Links" section for chest pain algorithms and additional  guidance. Performed at [facility], [address] [place], [place] [address]     CT CHEST ABDOMEN PELVIS WO CONTRAST  Addendum Date: 06/25/2023   ADDENDUM REPORT: 06/25/2023 01:51 ADDENDUM: These results were called by telephone at the time of interpretation on 06/25/2023 at 1:51 am to provider [facility] , who verbally acknowledged these results. Electronically Signed   By: Tish Frederickson M.D.   On: 06/25/2023  01:51   Result Date: 06/25/2023 CLINICAL DATA:  Non-small cell lung cancer (NSCLC), metastatic, assess treatment response. SOB and right leg weakness started around noon yesterday. Pt is eupnei EXAM: CT CHEST, ABDOMEN AND PELVIS WITHOUT CONTRAST TECHNIQUE: Multidetector CT imaging of the chest, abdomen and pelvis was performed following the standard protocol without IV contrast. RADIATION DOSE REDUCTION: This exam was performed according to the departmental dose-optimization program which includes automated exposure control, adjustment of the mA and/or kV according to patient size and/or use of iterative reconstruction technique. COMPARISON:  CT chest 08/29/2020, CT abdomen pelvis 06/04/2023 FINDINGS: CT CHEST FINDINGS Cardiovascular: Enlarged heart size. No significant pericardial effusion. The thoracic aorta is normal in caliber. Severe atherosclerotic plaque of the thoracic aorta. Four-vessel coronary artery calcifications. Mediastinum/Nodes: No gross hilar adenopathy, noting limited sensitivity for the detection of hilar adenopathy on this noncontrast study. no enlarged mediastinal or axillary lymph nodes. Thyroid gland, trachea, and esophagus demonstrate no significant findings. Small to moderate volume hiatal hernia. Lungs/Pleura: Centrilobular emphysematous changes. Slightly more conspicuous right upper lobe 11 x 7 mm pulmonary nodule with surrounding ground-glass airspace opacity (5:55). Interval development of a right upper lobe spiculated 11 x 7 mm pulmonary nodule (5:79). Slightly more conspicuous area scarring versus cluster of nodularity within the right upper lobe (5:50 5-57). Interval development of a subsolid left upper lobe 8 x 8 mm pulmonary nodule (5:51). Similar scarring at the left apex measuring 10 x 6 mm (5:34). Anteriorly along the left apex there is similar-appearing scarring. Interval development of a left lower lobe subpleural 10 x 10 mm ground-glass pulmonary nodule (5:98). Stable solid  5 mm right middle lobe pulmonary nodule (5:90). No pulmonary mass. No pleural effusion. No pneumothorax. Musculoskeletal: No chest wall abnormality. No suspicious lytic or blastic osseous lesions. No acute displaced fracture. Multilevel degenerative changes of the spine. CT ABDOMEN PELVIS FINDINGS Hepatobiliary: No focal liver abnormality. Calcified gallstones noted within the gallbladder lumen.  No gallbladder wall thickening or pericholecystic fluid. No biliary dilatation. Pancreas: No focal lesion. Normal pancreatic contour. No surrounding inflammatory changes. No main pancreatic ductal dilatation. Spleen: Normal in size without focal abnormality. Adrenals/Urinary Tract: No adrenal nodule bilaterally. No nephrolithiasis and no hydronephrosis. Hyperdense subcentimeter left renal lesion likely a proteinaceous versus hemorrhagic cyst-no further follow-up indicated. Fluid density lesions within bilateral kidneys likely represent simple renal cysts. Simple renal cysts, in the absence of clinically indicated signs/symptoms, require no independent follow-up. No ureterolithiasis or hydroureter. The urinary bladder is unremarkable. Stomach/Bowel: Stomach is within normal limits. No evidence of bowel wall thickening or dilatation. Colonic diverticulosis. The base of the appendix is enlarged in caliber measuring up to 1.1 cm with associated mild peri appendiceal fat stranding. The tip is not visualized with in its place a difficult to measure approximately 4 cm gas and fluid collection (versus less likely mass) along the expected region of the right ovary. The gas and fluid collection along the right adnexa is noted to be inseparable from the uterus as well as from a short segment of sigmoid colon. Vascular/Lymphatic: No abdominal aorta or iliac aneurysm. Mild atherosclerotic plaque of the aorta and its branches. No abdominal, pelvic, or inguinal lymphadenopathy. Reproductive: Right adnexa demonstrates findings noted in  stomach/bowel. Normal left adnexa. Coarsely calcified uterine fibroids are noted. Other: No intraperitoneal free fluid. No intraperitoneal free gas. No organized fluid collection. Musculoskeletal: No abdominal wall hernia or abnormality. No suspicious lytic or blastic osseous lesions. No acute displaced fracture. Multilevel degenerative changes of the spine. IMPRESSION: 1. Likely perforated acute appendicitis with likely developing approximately 4 cm abscess in the right adnexal region in the expected region of the appendiceal tip. Finding is inseparable from the uterus and a short portion of the sigmoid colon. Differential diagnosis includes tubo-ovarian abscess, malignancy, less likely complicated diverticulitis. Markedly limited evaluation on this noncontrast study. 2. New pulmonary nodules in a patient with history of lung cancer (comparison to 2021). Limited evaluation on this noncontrast study. Additional imaging evaluation or consultation with Pulmonology or Thoracic Surgery recommended. 3. Interval development of a subsolid left upper lobe 8 x 8 mm pulmonary nodule. 4. Interval development of a left lower lobe subpleural 10 x 10 mm ground-glass pulmonary nodule . 5. Interval development of a right upper lobe spiculated 11 x 7 mm pulmonary nodule. Slightly more conspicuous right upper lobe 11 x 7 mm pulmonary nodule with surrounding ground-glass airspace opacity. 6. Slightly more conspicuous area scarring versus cluster of nodularity within the right upper lobe. 7. Small to moderate volume hiatal hernia. 8. Cholelithiasis no CT finding of acute cholecystitis. 9. Diverticulosis. 10. Uterine fibroids. 11. Aortic Atherosclerosis (ICD10-I70.0) including four-vessel coronary calcification. Electronically Signed: By: Tish Frederickson M.D. On: 06/25/2023 01:34   CT T-SPINE NO CHARGE  Result Date: 06/25/2023 CLINICAL DATA:  SOB and right leg weakness started around noon yesterday. EXAM: CT THORACIC AND LUMBAR  SPINE WITHOUT CONTRAST TECHNIQUE: Multidetector CT imaging of the thoracic and lumbar spine was performed without contrast. Multiplanar CT image reconstructions were also generated. RADIATION DOSE REDUCTION: This exam was performed according to the departmental dose-optimization program which includes automated exposure control, adjustment of the mA and/or kV according to patient size and/or use of iterative reconstruction technique. COMPARISON:  CT abdomen pelvis 06/04/2023, CT chest 08/29/2020 FINDINGS: CT THORACIC SPINE FINDINGS Alignment: Normal. Vertebrae: No acute fracture or focal pathologic process. Paraspinal and other soft tissues: Negative. Disc levels: Couple level intervertebral disc space vacuum phenomenon. Otherwise maintained. CT LUMBAR  SPINE FINDINGS Segmentation: 5 lumbar type vertebrae. Alignment: Normal. Vertebrae: Multilevel mild to moderate facet arthropathy. No severe osseous neural foraminal or central canal stenosis. No acute fracture or focal pathologic process. Paraspinal and other soft tissues: Negative. Disc levels: Maintained. Other: Moderate severe osseous neural foraminal stenosis at the left C6-C7 level. IMPRESSION: CT THORACIC SPINE IMPRESSION No acute displaced fracture or traumatic listhesis of the thoracic spine. CT LUMBAR SPINE IMPRESSION No acute displaced fracture or traumatic listhesis of the lumbar spine. Electronically Signed   By: Tish Frederickson M.D.   On: 06/25/2023 01:07   CT L-SPINE NO CHARGE  Result Date: 06/25/2023 CLINICAL DATA:  SOB and right leg weakness started around noon yesterday. EXAM: CT THORACIC AND LUMBAR SPINE WITHOUT CONTRAST TECHNIQUE: Multidetector CT imaging of the thoracic and lumbar spine was performed without contrast. Multiplanar CT image reconstructions were also generated. RADIATION DOSE REDUCTION: This exam was performed according to the departmental dose-optimization program which includes automated exposure control, adjustment of the mA  and/or kV according to patient size and/or use of iterative reconstruction technique. COMPARISON:  CT abdomen pelvis 06/04/2023, CT chest 08/29/2020 FINDINGS: CT THORACIC SPINE FINDINGS Alignment: Normal. Vertebrae: No acute fracture or focal pathologic process. Paraspinal and other soft tissues: Negative. Disc levels: Couple level intervertebral disc space vacuum phenomenon. Otherwise maintained. CT LUMBAR SPINE FINDINGS Segmentation: 5 lumbar type vertebrae. Alignment: Normal. Vertebrae: Multilevel mild to moderate facet arthropathy. No severe osseous neural foraminal or central canal stenosis. No acute fracture or focal pathologic process. Paraspinal and other soft tissues: Negative. Disc levels: Maintained. Other: Moderate severe osseous neural foraminal stenosis at the left C6-C7 level. IMPRESSION: CT THORACIC SPINE IMPRESSION No acute displaced fracture or traumatic listhesis of the thoracic spine. CT LUMBAR SPINE IMPRESSION No acute displaced fracture or traumatic listhesis of the lumbar spine. Electronically Signed   By: Tish Frederickson M.D.   On: 06/25/2023 01:07   CT HEAD WO CONTRAST ( )  Result Date: 06/25/2023 CLINICAL DATA:  Memory loss dementia, generalized weakness EXAM: CT HEAD WITHOUT CONTRAST TECHNIQUE: Contiguous axial images were obtained from the base of the skull through the vertex without intravenous contrast. RADIATION DOSE REDUCTION: This exam was performed according to the departmental dose-optimization program which includes automated exposure control, adjustment of the mA and/or kV according to patient size and/or use of iterative reconstruction technique. COMPARISON:  CT head 03/30/2021 FINDINGS: Brain: Stable cerebral ventricle sizes are concordant with the degree of cerebral volume loss. Patchy and confluent areas of decreased attenuation are noted throughout the deep and periventricular white matter of the cerebral hemispheres bilaterally, compatible with chronic microvascular  ischemic disease. Right occipital lobe encephalomalacia. No evidence of large-territorial acute infarction. No parenchymal hemorrhage. No mass lesion. No extra-axial collection. No mass effect or midline shift. No hydrocephalus. Basilar cisterns are patent. Vascular: No hyperdense vessel. Atherosclerotic calcifications are present within the cavernous internal carotid and vertebral arteries. Skull: No acute fracture or focal lesion. Sinuses/Orbits: Paranasal sinuses and mastoid air cells are clear. Left lens replacement. Otherwise the orbits are unremarkable. Other: None. IMPRESSION: No acute intracranial abnormality. Electronically Signed   By: Tish Frederickson M.D.   On: 06/25/2023 00:54   DG Chest 2 View  Result Date: 06/24/2023 CLINICAL DATA:  Shortness of breath and extremity weakness EXAM: CHEST - 2 VIEW COMPARISON:  Chest radiograph dated 06/03/2023 FINDINGS: Lines/tubes: Left chest wall pacemaker leads project over the right atrium and ventricle and tributary of the coronary sinus. Lungs: Patient is rotated slightly to the right. Unchanged  elevation of the right hemidiaphragm. Slightly low lung volumes. No focal consolidations. Patchy left basilar opacities. Pleura: No pneumothorax or pleural effusion. Heart/mediastinum: The heart size and mediastinal contours are within normal limits. Bones: No acute osseous abnormality. IMPRESSION: Slightly low lung volumes with patchy left basilar opacities, which may represent atelectasis, aspiration, or pneumonia. Electronically Signed   By: Agustin Cree M.D.   On: 06/24/2023 16:33    ROS 10 point review of systems is negative except as listed above in HPI.   Physical Exam Blood pressure (!) 156/69, pulse 62, temperature 98 F (36.7 C), temperature source Axillary, resp. rate 15, height 5\' 4"  (1.626 m), weight 55.8 kg, SpO2 96%. Constitutional: well-developed, well-nourished HEENT: pupils equal, round, reactive to light, 2mm b/l, moist conjunctiva, external  inspection of ears and nose normal, hearing intact Oropharynx: normal oropharyngeal mucosa, normal dentition Neck: no thyromegaly, trachea midline, no midline cervical tenderness to palpation Chest: breath sounds equal bilaterally, normal respiratory effort, no midline or lateral chest wall tenderness to palpation/deformity Abdomen: soft, mild RLQ TTP but only to deep palpation, no bruising, no hepatosplenomegaly Extremities: 2+ radial and pedal pulses bilaterally, intact motor and sensation bilateral UE and LE, no peripheral edema MSK: unable to assess gait/station, no clubbing/cyanosis of fingers/toes, normal ROM of all four extremities Skin: warm, dry, no rashes Psych: unable to assess     Assessment/Plan: Perforated appendicitis  Perforated appendicitis - recommend broad spectrum abx, recommend repeat CT in 3-5d as abdominal exam is unreliable. Expect development of abscess that would require IR drain, but currently is just a phlegmon. FEN - regular diet okay from surgery standpoint DVT - SCDs, LMWH Dispo -  per primary     Diamantina Monks, MD General and Trauma Surgery [facility]

## 2023-06-25 NOTE — ED Notes (Addendum)
ED TO INPATIENT HANDOFF REPORT  ED Nurse Name and Phone #: Gustavo Lah, [place] [phone]  S Name/Age/Gender Betty [name] 77 y.o. female Room/Bed: 033C/033C  Code Status   Code Status: Prior  Home/SNF/Other Home Patient oriented to: self Is this baseline? No   Triage Complete: Triage complete  Chief Complaint Perforated appendicitis [K35.32]  Triage Note Pt c/o SOB and right leg weakness started around noon yesterday. Pt is eupneic.    Allergies Allergies  Allergen Reactions   Sulfa Antibiotics Itching    Level of Care/Admitting Diagnosis ED Disposition     ED Disposition  Admit   Condition  --   Comment  Hospital Area: [facility] [[id number]]  Level of Care: Telemetry Medical [104]  May admit patient to [facility] or [facility] if equivalent level of care is available:: No  Covid Evaluation: Asymptomatic - no recent exposure (last 10 days) testing not required  Diagnosis: Perforated appendicitis [259563]  Admitting Physician: Tonye Royalty [[id number]]  Attending Physician: Tonye Royalty [[id number]]  Certification:: I certify this patient will need inpatient services for at least 2 midnights  Expected Medical Readiness: 06/27/2023          B Medical/Surgery History 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)    Arthritis    bursitis in right shoulder (05/21/2018)   Blood transfusion without reported diagnosis    Cerebral aneurysm, nonruptured 07/16/2018   Right anterior communicating artery   CKD (chronic kidney disease), stage IV (HCC)    Colon polyps    COPD (chronic obstructive pulmonary disease) (HCC)    emphysema   Dementia (HCC) 07/16/2018   GERD (gastroesophageal reflux disease)    Hypercholesterolemia    Hypertension    Non-small cell carcinoma of right lung, stage 1 (HCC) 05/17/2015   Stage IB, right upper lobectomy 04/29/15, chemo    NSTEMI (non-ST elevated  myocardial infarction) (HCC) 03/24/2018   Presence of permanent cardiac pacemaker 05/21/2018   Pure hypercholesterolemia    Sciatica of right side    Seasonal allergic rhinitis    Tobacco dependence    Tubular adenoma of colon    Vitamin D deficiency    Past Surgical History:  Procedure Laterality Date   BIOPSY  06/26/2019   Procedure: BIOPSY;  Surgeon: Charlott Rakes, MD;  Location: [facility] ENDOSCOPY;  Service: Endoscopy;;   BIV PACEMAKER INSERTION CRT-P N/A 05/21/2018   Procedure: BIV PACEMAKER INSERTION CRT-P;  Surgeon: Duke Salvia, MD;  Location: [facility] INVASIVE CV LAB;  Service: Cardiovascular;  Laterality: N/A;   BREAST SURGERY     small mass removed from left breast--benign   CRYO INTERCOSTAL NERVE BLOCK Right 04/29/2015   Procedure: CRYO INTERCOSTAL NERVE BLOCK;  Surgeon: Loreli Slot, MD;  Location: [facility] OR;  Service: Thoracic;  Laterality: Right;   ESOPHAGOGASTRODUODENOSCOPY (EGD) WITH PROPOFOL N/A 06/26/2019   Procedure: ESOPHAGOGASTRODUODENOSCOPY (EGD) WITH PROPOFOL;  Surgeon: Charlott Rakes, MD;  Location: [facility] ENDOSCOPY;  Service: Endoscopy;  Laterality: N/A;   INSERT / REPLACE / REMOVE PACEMAKER  05/21/2018   LOBECTOMY Right 04/29/2015   Procedure: RIGHT LOWER LUNG LOBECTOMY ;  Surgeon: Loreli Slot, MD;  Location: [facility] OR;  Service: Thoracic;  Laterality: Right;   LYMPH NODE DISSECTION Right 04/29/2015   Procedure: RIGHT LUNG LYMPH NODE DISSECTION;  Surgeon: Loreli Slot, MD;  Location: [facility] OR;  Service: Thoracic;  Laterality: Right;   RIGHT/LEFT HEART CATH AND CORONARY ANGIOGRAPHY N/A 04/24/2018  Procedure: RIGHT/LEFT HEART CATH AND CORONARY ANGIOGRAPHY;  Surgeon: Runell Gess, MD;  Location: [facility] INVASIVE CV LAB;  Service: Cardiovascular;  Laterality: N/A;   VAGINAL DELIVERY     52 yrs ago   VIDEO ASSISTED THORACOSCOPY (VATS)/ LOBECTOMY Right 04/29/2015   Procedure: RIGHT VIDEO ASSISTED THORACOSCOPY (VATS) WEDGE RESECTION/ RIGHT LOWER LOBECTOMY,  CRYO-ANALGESIA OF INTERCOSTAL NERVES;  Surgeon: Loreli Slot, MD;  Location: [facility] OR;  Service: Thoracic;  Laterality: Right;     A IV Location/Drains/Wounds Patient Lines/Drains/Airways Status     Active Line/Drains/Airways     Name Placement date Placement time Site Days   Peripheral IV 06/25/23 20 G 1.88" Anterior;Right Forearm 06/25/23  0208  Forearm  less than 1            Intake/Output Last 24 hours  Intake/Output Summary (Last 24 hours) at 06/25/2023 0726 Last data filed at 06/25/2023 0251 Gross per 24 hour  Intake 42.57 ml  Output --  Net 42.57 ml    Labs/Imaging Results for orders placed or performed during the hospital encounter of 06/24/23 (from the past 48 hour(s))  Basic metabolic panel     Status: Abnormal   Collection Time: 06/24/23  2:25 PM  Result Value Ref Range   Sodium 143 135 - 145 mmol/L   Potassium 4.0 3.5 - 5.1 mmol/L   Chloride 108 98 - 111 mmol/L   CO2 22 22 - 32 mmol/L   Glucose, Bld 110 (H) 70 - 99 mg/dL    Comment: Glucose reference range applies only to samples taken after fasting for at least 8 hours.   BUN 26 (H) 8 - 23 mg/dL   Creatinine, Ser 3.76 (H) 0.44 - 1.00 mg/dL   Calcium 8.9 8.9 - 28.3 mg/dL   GFR, Estimated 15 (L) >60 mL/min    Comment: (NOTE) Calculated using the CKD-EPI Creatinine Equation (2021)    Anion gap 13 5 - 15    Comment: Performed at [facility], [address] [place], [place] [address]  CBC     Status: Abnormal   Collection Time: 06/24/23  2:25 PM  Result Value Ref Range   WBC 12.2 (H) 4.0 - 10.5 K/uL   RBC 3.45 (L) 3.87 - 5.11 MIL/uL   Hemoglobin 10.7 (L) 12.0 - 15.0 g/dL   HCT 16.0 (L) 73.7 - 10.6 %   MCV 93.6 80.0 - 100.0 fL   MCH 31.0 26.0 - 34.0 pg   MCHC 33.1 30.0 - 36.0 g/dL   RDW 26.9 48.5 - 46.2 %   Platelets 364 150 - 400 K/uL   nRBC 0.0 0.0 - 0.2 %    Comment: Performed at [facility], [address], [place], [place] [address]  Troponin I (High Sensitivity)     Status:  None   Collection Time: 06/24/23  2:25 PM  Result Value Ref Range   Troponin I (High Sensitivity) 13 <18 ng/L    Comment: (NOTE) Elevated high sensitivity troponin I (hsTnI) values and significant  changes across serial measurements may suggest ACS but many other  chronic and acute conditions are known to elevate hsTnI results.  Refer to the "Links" section for chest pain algorithms and additional  guidance. Performed at [facility], [address] [place], [place] [address]   Urinalysis, Routine w reflex microscopic -Urine, Clean Catch     Status: Abnormal   Collection Time: 06/24/23  3:13 PM  Result Value Ref Range   Color, Urine  YELLOW YELLOW   APPearance HAZY (A) CLEAR   Specific Gravity, Urine 1.011 1.005 - 1.030   pH 6.0 5.0 - 8.0   Glucose, UA NEGATIVE NEGATIVE mg/dL   Hgb urine dipstick NEGATIVE NEGATIVE   Bilirubin Urine NEGATIVE NEGATIVE   Ketones, ur NEGATIVE NEGATIVE mg/dL   Protein, ur 30 (A) NEGATIVE mg/dL   Nitrite NEGATIVE NEGATIVE   Leukocytes,Ua MODERATE (A) NEGATIVE   RBC / HPF 0-5 0 - 5 RBC/hpf   WBC, UA 11-20 0 - 5 WBC/hpf   Bacteria, UA FEW (A) NONE SEEN   Squamous Epithelial / HPF 6-10 0 - 5 /HPF   Mucus PRESENT    Non Squamous Epithelial 0-5 (A) NONE SEEN    Comment: Performed at [facility], [address], [place], [place] [address]  Troponin I (High Sensitivity)     Status: None   Collection Time: 06/24/23 10:47 PM  Result Value Ref Range   Troponin I (High Sensitivity) 13 <18 ng/L    Comment: (NOTE) Elevated high sensitivity troponin I (hsTnI) values and significant  changes across serial measurements may suggest ACS but many other  chronic and acute conditions are known to elevate hsTnI results.  Refer to the "Links" section for chest pain algorithms and additional  guidance. Performed at [facility], [address], [place], [place] [address]    CT CHEST ABDOMEN PELVIS WO CONTRAST  Addendum Date: 06/25/2023    ADDENDUM REPORT: 06/25/2023 01:51 ADDENDUM: These results were called by telephone at the time of interpretation on 06/25/2023 at 1:51 am to provider [facility] , who verbally acknowledged these results. Electronically Signed   By: Tish Frederickson M.D.   On: 06/25/2023 01:51   Result Date: 06/25/2023 CLINICAL DATA:  Non-small cell lung cancer (NSCLC), metastatic, assess treatment response. SOB and right leg weakness started around noon yesterday. Pt is eupnei EXAM: CT CHEST, ABDOMEN AND PELVIS WITHOUT CONTRAST TECHNIQUE: Multidetector CT imaging of the chest, abdomen and pelvis was performed following the standard protocol without IV contrast. RADIATION DOSE REDUCTION: This exam was performed according to the departmental dose-optimization program which includes automated exposure control, adjustment of the mA and/or kV according to patient size and/or use of iterative reconstruction technique. COMPARISON:  CT chest 08/29/2020, CT abdomen pelvis 06/04/2023 FINDINGS: CT CHEST FINDINGS Cardiovascular: Enlarged heart size. No significant pericardial effusion. The thoracic aorta is normal in caliber. Severe atherosclerotic plaque of the thoracic aorta. Four-vessel coronary artery calcifications. Mediastinum/Nodes: No gross hilar adenopathy, noting limited sensitivity for the detection of hilar adenopathy on this noncontrast study. no enlarged mediastinal or axillary lymph nodes. Thyroid gland, trachea, and esophagus demonstrate no significant findings. Small to moderate volume hiatal hernia. Lungs/Pleura: Centrilobular emphysematous changes. Slightly more conspicuous right upper lobe 11 x 7 mm pulmonary nodule with surrounding ground-glass airspace opacity (5:55). Interval development of a right upper lobe spiculated 11 x 7 mm pulmonary nodule (5:79). Slightly more conspicuous area scarring versus cluster of nodularity within the right upper lobe (5:50 5-57). Interval development of a subsolid left upper lobe 8 x 8  mm pulmonary nodule (5:51). Similar scarring at the left apex measuring 10 x 6 mm (5:34). Anteriorly along the left apex there is similar-appearing scarring. Interval development of a left lower lobe subpleural 10 x 10 mm ground-glass pulmonary nodule (5:98). Stable solid 5 mm right middle lobe pulmonary nodule (5:90). No pulmonary mass. No pleural effusion. No pneumothorax. Musculoskeletal: No chest wall abnormality. No suspicious lytic or blastic osseous lesions.  No acute displaced fracture. Multilevel degenerative changes of the spine. CT ABDOMEN PELVIS FINDINGS Hepatobiliary: No focal liver abnormality. Calcified gallstones noted within the gallbladder lumen. No gallbladder wall thickening or pericholecystic fluid. No biliary dilatation. Pancreas: No focal lesion. Normal pancreatic contour. No surrounding inflammatory changes. No main pancreatic ductal dilatation. Spleen: Normal in size without focal abnormality. Adrenals/Urinary Tract: No adrenal nodule bilaterally. No nephrolithiasis and no hydronephrosis. Hyperdense subcentimeter left renal lesion likely a proteinaceous versus hemorrhagic cyst-no further follow-up indicated. Fluid density lesions within bilateral kidneys likely represent simple renal cysts. Simple renal cysts, in the absence of clinically indicated signs/symptoms, require no independent follow-up. No ureterolithiasis or hydroureter. The urinary bladder is unremarkable. Stomach/Bowel: Stomach is within normal limits. No evidence of bowel wall thickening or dilatation. Colonic diverticulosis. The base of the appendix is enlarged in caliber measuring up to 1.1 cm with associated mild peri appendiceal fat stranding. The tip is not visualized with in its place a difficult to measure approximately 4 cm gas and fluid collection (versus less likely mass) along the expected region of the right ovary. The gas and fluid collection along the right adnexa is noted to be inseparable from the uterus as well  as from a short segment of sigmoid colon. Vascular/Lymphatic: No abdominal aorta or iliac aneurysm. Mild atherosclerotic plaque of the aorta and its branches. No abdominal, pelvic, or inguinal lymphadenopathy. Reproductive: Right adnexa demonstrates findings noted in stomach/bowel. Normal left adnexa. Coarsely calcified uterine fibroids are noted. Other: No intraperitoneal free fluid. No intraperitoneal free gas. No organized fluid collection. Musculoskeletal: No abdominal wall hernia or abnormality. No suspicious lytic or blastic osseous lesions. No acute displaced fracture. Multilevel degenerative changes of the spine. IMPRESSION: 1. Likely perforated acute appendicitis with likely developing approximately 4 cm abscess in the right adnexal region in the expected region of the appendiceal tip. Finding is inseparable from the uterus and a short portion of the sigmoid colon. Differential diagnosis includes tubo-ovarian abscess, malignancy, less likely complicated diverticulitis. Markedly limited evaluation on this noncontrast study. 2. New pulmonary nodules in a patient with history of lung cancer (comparison to 2021). Limited evaluation on this noncontrast study. Additional imaging evaluation or consultation with Pulmonology or Thoracic Surgery recommended. 3. Interval development of a subsolid left upper lobe 8 x 8 mm pulmonary nodule. 4. Interval development of a left lower lobe subpleural 10 x 10 mm ground-glass pulmonary nodule . 5. Interval development of a right upper lobe spiculated 11 x 7 mm pulmonary nodule. Slightly more conspicuous right upper lobe 11 x 7 mm pulmonary nodule with surrounding ground-glass airspace opacity. 6. Slightly more conspicuous area scarring versus cluster of nodularity within the right upper lobe. 7. Small to moderate volume hiatal hernia. 8. Cholelithiasis no CT finding of acute cholecystitis. 9. Diverticulosis. 10. Uterine fibroids. 11. Aortic Atherosclerosis (ICD10-I70.0)  including four-vessel coronary calcification. Electronically Signed: By: Tish Frederickson M.D. On: 06/25/2023 01:34   CT T-SPINE NO CHARGE  Result Date: 06/25/2023 CLINICAL DATA:  SOB and right leg weakness started around noon yesterday. EXAM: CT THORACIC AND LUMBAR SPINE WITHOUT CONTRAST TECHNIQUE: Multidetector CT imaging of the thoracic and lumbar spine was performed without contrast. Multiplanar CT image reconstructions were also generated. RADIATION DOSE REDUCTION: This exam was performed according to the departmental dose-optimization program which includes automated exposure control, adjustment of the mA and/or kV according to patient size and/or use of iterative reconstruction technique. COMPARISON:  CT abdomen pelvis 06/04/2023, CT chest 08/29/2020 FINDINGS: CT THORACIC SPINE FINDINGS Alignment: Normal. Vertebrae:  No acute fracture or focal pathologic process. Paraspinal and other soft tissues: Negative. Disc levels: Couple level intervertebral disc space vacuum phenomenon. Otherwise maintained. CT LUMBAR SPINE FINDINGS Segmentation: 5 lumbar type vertebrae. Alignment: Normal. Vertebrae: Multilevel mild to moderate facet arthropathy. No severe osseous neural foraminal or central canal stenosis. No acute fracture or focal pathologic process. Paraspinal and other soft tissues: Negative. Disc levels: Maintained. Other: Moderate severe osseous neural foraminal stenosis at the left C6-C7 level. IMPRESSION: CT THORACIC SPINE IMPRESSION No acute displaced fracture or traumatic listhesis of the thoracic spine. CT LUMBAR SPINE IMPRESSION No acute displaced fracture or traumatic listhesis of the lumbar spine. Electronically Signed   By: Tish Frederickson M.D.   On: 06/25/2023 01:07   CT L-SPINE NO CHARGE  Result Date: 06/25/2023 CLINICAL DATA:  SOB and right leg weakness started around noon yesterday. EXAM: CT THORACIC AND LUMBAR SPINE WITHOUT CONTRAST TECHNIQUE: Multidetector CT imaging of the thoracic and  lumbar spine was performed without contrast. Multiplanar CT image reconstructions were also generated. RADIATION DOSE REDUCTION: This exam was performed according to the departmental dose-optimization program which includes automated exposure control, adjustment of the mA and/or kV according to patient size and/or use of iterative reconstruction technique. COMPARISON:  CT abdomen pelvis 06/04/2023, CT chest 08/29/2020 FINDINGS: CT THORACIC SPINE FINDINGS Alignment: Normal. Vertebrae: No acute fracture or focal pathologic process. Paraspinal and other soft tissues: Negative. Disc levels: Couple level intervertebral disc space vacuum phenomenon. Otherwise maintained. CT LUMBAR SPINE FINDINGS Segmentation: 5 lumbar type vertebrae. Alignment: Normal. Vertebrae: Multilevel mild to moderate facet arthropathy. No severe osseous neural foraminal or central canal stenosis. No acute fracture or focal pathologic process. Paraspinal and other soft tissues: Negative. Disc levels: Maintained. Other: Moderate severe osseous neural foraminal stenosis at the left C6-C7 level. IMPRESSION: CT THORACIC SPINE IMPRESSION No acute displaced fracture or traumatic listhesis of the thoracic spine. CT LUMBAR SPINE IMPRESSION No acute displaced fracture or traumatic listhesis of the lumbar spine. Electronically Signed   By: Tish Frederickson M.D.   On: 06/25/2023 01:07   CT HEAD WO CONTRAST ( )  Result Date: 06/25/2023 CLINICAL DATA:  Memory loss dementia, generalized weakness EXAM: CT HEAD WITHOUT CONTRAST TECHNIQUE: Contiguous axial images were obtained from the base of the skull through the vertex without intravenous contrast. RADIATION DOSE REDUCTION: This exam was performed according to the departmental dose-optimization program which includes automated exposure control, adjustment of the mA and/or kV according to patient size and/or use of iterative reconstruction technique. COMPARISON:  CT head 03/30/2021 FINDINGS: Brain: Stable  cerebral ventricle sizes are concordant with the degree of cerebral volume loss. Patchy and confluent areas of decreased attenuation are noted throughout the deep and periventricular white matter of the cerebral hemispheres bilaterally, compatible with chronic microvascular ischemic disease. Right occipital lobe encephalomalacia. No evidence of large-territorial acute infarction. No parenchymal hemorrhage. No mass lesion. No extra-axial collection. No mass effect or midline shift. No hydrocephalus. Basilar cisterns are patent. Vascular: No hyperdense vessel. Atherosclerotic calcifications are present within the cavernous internal carotid and vertebral arteries. Skull: No acute fracture or focal lesion. Sinuses/Orbits: Paranasal sinuses and mastoid air cells are clear. Left lens replacement. Otherwise the orbits are unremarkable. Other: None. IMPRESSION: No acute intracranial abnormality. Electronically Signed   By: Tish Frederickson M.D.   On: 06/25/2023 00:54   DG Chest 2 View  Result Date: 06/24/2023 CLINICAL DATA:  Shortness of breath and extremity weakness EXAM: CHEST - 2 VIEW COMPARISON:  Chest radiograph dated 06/03/2023 FINDINGS: Lines/tubes: Left  chest wall pacemaker leads project over the right atrium and ventricle and tributary of the coronary sinus. Lungs: Patient is rotated slightly to the right. Unchanged elevation of the right hemidiaphragm. Slightly low lung volumes. No focal consolidations. Patchy left basilar opacities. Pleura: No pneumothorax or pleural effusion. Heart/mediastinum: The heart size and mediastinal contours are within normal limits. Bones: No acute osseous abnormality. IMPRESSION: Slightly low lung volumes with patchy left basilar opacities, which may represent atelectasis, aspiration, or pneumonia. Electronically Signed   By: Agustin Cree M.D.   On: 06/24/2023 16:33    Pending Labs Unresulted Labs (From admission, onward)     Start     Ordered   06/25/23 0207  Blood culture  (routine x 2)  BLOOD CULTURE X 2,   R (with STAT occurrences)      06/25/23 0206            Vitals/Pain Today's Vitals   06/25/23 0330 06/25/23 0400 06/25/23 0430 06/25/23 0721  BP: (!) 153/74 (!) 149/73 (!) 163/70 (!) 140/58  Pulse: 65 68 65   Resp: (!) 25 14 14 20   Temp:    98 F (36.7 C)  TempSrc:    Oral  SpO2: 96% 95% 96% 98%  Weight:      Height:      PainSc:        Isolation Precautions No active isolations  Medications Medications  piperacillin-tazobactam (ZOSYN) IVPB 3.375 g (0 g Intravenous Stopped 06/25/23 0247)    Mobility walks with device, with cane      Focused Assessments Renal Assessment Handoff:  Elevated creat, decreased gfr       R Recommendations: See Admitting Provider Note  Report given to:   Additional Notes:   Ct shows likely perf appendix, vss

## 2023-06-25 NOTE — H&P (Signed)
History and Physical   [facility] [place] @ [facility] Admission History and Physical [employer], D.O.    Patient Name: Betty [name] MR#: [id number] Date of Birth: 05/03/1946 Date of Admission: 06/24/2023  Referring MD/NP/PA: Sharilyn Sites Primary Care Physician: Collene Mares, [place]  Chief Complaint:  Chief Complaint  Patient presents with   Shortness of Breath   Extremity Weakness  Please note the entire history is obtained from the patient's emergency department chart, emergency department provider and the patient's family who is at the bedside. Patient's personal history is limited by dementia and altered mental status.   HPI: Betty [name] is a 77 y.o. female with a known history of acute combined systolic and diastolic heart failure, CKD stage IV, COPD, dementia, GERD, hypertension, hyperlipidemia, non-small cell carcinoma of the lung, CAD status post MI, pacemaker, presents to the emergency department for evaluation of shortness of breath.  Patient son found her today with unsteady gait, shortness of breath, dyspnea on exertion and confused. She was covered in urine and feces.  Patient denies any complaints, states that she is fine.   Of note patient recently had an abdominal mass found questionable ovarian versus appendix.  It was recommended that she have an MRI but since she has a pacemaker she was unable to have it completed therefore she completed 2 weeks of Augmentin and is supposed to follow-up with surgery.  Patient lives alone and is independent with ADLs.   Patient denies fevers/chills, weakness, dizziness, chest pain, shortness of breath, N/V/C/D, abdominal pain, dysuria/frequency, changes in mental status.    Otherwise there has been no change in status. Patient has been taking medication as prescribed and there has been no recent change in medication or diet.  There has been no recent illness, hospitalizations, travel or sick contacts.    EMS/ED Course:  Patient received Zosyn. Medical admission has been requested for further management of perforated appendicitis.  Review of Systems: Patient denies all, positives from son at bedside.   CONSTITUTIONAL: No fever/chills, fatigue, weakness, weight gain/loss, headache. EYES: No blurry or double vision. ENT: No tinnitus, postnasal drip, redness or soreness of the oropharynx. RESPIRATORY: Positive shortness of breath, dyspnea on exertion no hemoptysis.  CARDIOVASCULAR: No chest pain, palpitations, syncope, orthopnea. No lower extremity edema.  GASTROINTESTINAL: No nausea, vomiting, abdominal pain, diarrhea, constipation.  No hematemesis, melena or hematochezia. GENITOURINARY: No dysuria, frequency, hematuria. ENDOCRINE: No polyuria or nocturia. No heat or cold intolerance. HEMATOLOGY: No anemia, bruising, bleeding. INTEGUMENTARY: No rashes, ulcers, lesions. MUSCULOSKELETAL: No arthritis, gout. NEUROLOGIC: Positive ataxia.  No numbness, tingling,  seizure-type activity, weakness. PSYCHIATRIC: No anxiety, depression, insomnia.   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)    Arthritis    bursitis in right shoulder (05/21/2018)   Blood transfusion without reported diagnosis    Cerebral aneurysm, nonruptured 07/16/2018   Right anterior communicating artery   CKD (chronic kidney disease), stage IV (HCC)    Colon polyps    COPD (chronic obstructive pulmonary disease) (HCC)    emphysema   Dementia (HCC) 07/16/2018   GERD (gastroesophageal reflux disease)    Hypercholesterolemia    Hypertension    Non-small cell carcinoma of right lung, stage 1 (HCC) 05/17/2015   Stage IB, right upper lobectomy 04/29/15, chemo    NSTEMI (non-ST elevated myocardial infarction) (HCC) 03/24/2018   Presence of permanent cardiac pacemaker 05/21/2018   Pure hypercholesterolemia    Sciatica of right side  Seasonal allergic rhinitis    Tobacco  dependence    Tubular adenoma of colon    Vitamin D deficiency     Past Surgical History:  Procedure Laterality Date   BIOPSY  06/26/2019   Procedure: BIOPSY;  Surgeon: Charlott Rakes, MD;  Location: [facility] ENDOSCOPY;  Service: Endoscopy;;   BIV PACEMAKER INSERTION CRT-P N/A 05/21/2018   Procedure: BIV PACEMAKER INSERTION CRT-P;  Surgeon: Duke Salvia, MD;  Location: [facility] INVASIVE CV LAB;  Service: Cardiovascular;  Laterality: N/A;   BREAST SURGERY     small mass removed from left breast--benign   CRYO INTERCOSTAL NERVE BLOCK Right 04/29/2015   Procedure: CRYO INTERCOSTAL NERVE BLOCK;  Surgeon: Loreli Slot, MD;  Location: [facility] OR;  Service: Thoracic;  Laterality: Right;   ESOPHAGOGASTRODUODENOSCOPY (EGD) WITH PROPOFOL N/A 06/26/2019   Procedure: ESOPHAGOGASTRODUODENOSCOPY (EGD) WITH PROPOFOL;  Surgeon: Charlott Rakes, MD;  Location: [facility] ENDOSCOPY;  Service: Endoscopy;  Laterality: N/A;   INSERT / REPLACE / REMOVE PACEMAKER  05/21/2018   LOBECTOMY Right 04/29/2015   Procedure: RIGHT LOWER LUNG LOBECTOMY ;  Surgeon: Loreli Slot, MD;  Location: [facility] OR;  Service: Thoracic;  Laterality: Right;   LYMPH NODE DISSECTION Right 04/29/2015   Procedure: RIGHT LUNG LYMPH NODE DISSECTION;  Surgeon: Loreli Slot, MD;  Location: [facility] OR;  Service: Thoracic;  Laterality: Right;   RIGHT/LEFT HEART CATH AND CORONARY ANGIOGRAPHY N/A 04/24/2018   Procedure: RIGHT/LEFT HEART CATH AND CORONARY ANGIOGRAPHY;  Surgeon: Runell Gess, MD;  Location: [facility] INVASIVE CV LAB;  Service: Cardiovascular;  Laterality: N/A;   VAGINAL DELIVERY     52 yrs ago   VIDEO ASSISTED THORACOSCOPY (VATS)/ LOBECTOMY Right 04/29/2015   Procedure: RIGHT VIDEO ASSISTED THORACOSCOPY (VATS) WEDGE RESECTION/ RIGHT LOWER LOBECTOMY, CRYO-ANALGESIA OF INTERCOSTAL NERVES;  Surgeon: Loreli Slot, MD;  Location: [facility] OR;  Service: Thoracic;  Laterality: Right;     reports that she quit smoking about 8 years ago. Her smoking  use included cigarettes. She started smoking about 55 years ago. She has a 35.3 pack-year smoking history. She has been exposed to tobacco smoke. She has never used smokeless tobacco. She reports current alcohol use. She reports that she does not use drugs.  Allergies  Allergen Reactions   Sulfa Antibiotics Itching    Family History  Problem Relation Age of Onset   Cancer Father    Cancer Sister    Heart attack Brother    Dementia Brother    Other Son        bicuspid aortic valve   Heart attack Brother    Cancer Sister     Prior to Admission medications   Medication Sig Start Date End Date Taking? Authorizing Provider  acetaminophen (TYLENOL) 325 MG tablet Take 650 mg by mouth every 6 (six) hours as needed for mild pain or moderate pain.   Yes [provider]  aspirin EC 81 MG tablet Take 81 mg by mouth at bedtime.   Yes [provider]  atorvastatin (LIPITOR) 40 MG tablet TAKE 1 TABLET BY MOUTH EVERY DAY AT 6 PM 07/23/22  Yes Lyn Records, MD  cetirizine (ZYRTEC) 10 MG tablet Take 10 mg by mouth daily.   Yes [provider]  Cholecalciferol (VITAMIN D) 2000 units tablet Take 1,000 Units by mouth at bedtime.    Yes [provider]  donepezil (ARICEPT) 10 MG tablet Take 1 tablet (10 mg total) by mouth at bedtime. 12/14/21  Yes Glean Salvo, NP  febuxostat (ULORIC) 40 MG tablet Take 40 mg by mouth at bedtime. 03/27/23  Yes [provider]  hydrALAZINE (APRESOLINE) 50 MG tablet TAKE 1 TABLET(50 MG) BY MOUTH THREE TIMES DAILY 07/13/22  Yes Lyn Records, MD  isosorbide dinitrate (ISORDIL) 20 MG tablet TAKE 1 TABLET BY MOUTH THREE TIMES DAILY 08/08/22  Yes Lyn Records, MD  memantine (NAMENDA) 10 MG tablet Take 1 tablet (10 mg total) by mouth 2 (two) times daily. 06/28/22  Yes Glean Salvo, NP  metoprolol succinate (TOPROL-XL) 50 MG 24 hr tablet Take 1 tablet (50 mg total) by mouth daily. Take with or immediately following a meal. 06/10/23  Yes  Pemberton, Kathlynn Grate, MD  Multiple Vitamin (MULTIVITAMIN WITH MINERALS) TABS tablet Take 0.5 tablets by mouth at bedtime.   Yes [provider]  pantoprazole (PROTONIX) 40 MG tablet Take 40 mg by mouth every morning. 03/14/23  Yes [provider]  QUEtiapine (SEROQUEL) 25 MG tablet Take 25 mg by mouth at bedtime. 05/28/23  Yes [provider]  sodium bicarbonate 650 MG tablet Take 1 tablet (650 mg total) by mouth 2 (two) times daily. 07/02/19  Yes Sheikh, Omair Latif, DO  nitroGLYCERIN (NITROSTAT) 0.4 MG SL tablet Place 1 tablet (0.4 mg total) under the tongue every 5 (five) minutes x 3 doses as needed for chest pain. Patient not taking: Reported on 06/25/2023 04/26/18   Berton Bon, NP    Physical Exam: Vitals:   06/25/23 0400 06/25/23 0430 06/25/23 0721 06/25/23 0831  BP: (!) 149/73 (!) 163/70 (!) 140/58 (!) 158/77  Pulse: 68 65  70  Resp: 14 14 20 17   Temp:   98 F (36.7 C) 97.8 F (36.6 C)  TempSrc:   Oral Oral  SpO2: 95% 96% 98% 100%  Weight:      Height:        GENERAL: 77 y.o.-year-old black female patient, well-developed, well-nourished lying in the bed in no acute distress.  Pleasant and cooperative.   HEENT: Head atraumatic, normocephalic. Pupils equal. Mucus membranes moist. NECK: Supple. No JVD. CHEST: Normal breath sounds bilaterally. No wheezing, rales, rhonchi or crackles. No use of accessory muscles of respiration.  No reproducible chest wall tenderness.  CARDIOVASCULAR: S1, S2 normal. No murmurs, rubs, or gallops. Cap refill <2 seconds. Pulses intact distally.  ABDOMEN: Soft, nondistended, nontender. No rebound, guarding, rigidity. Normoactive bowel sounds present in all four quadrants.  EXTREMITIES: No pedal edema, cyanosis, or clubbing. No calf tenderness or Homan's sign.  NEUROLOGIC: The patient is alert and oriented x 1. Cranial nerves II through XII are grossly intact with no focal sensorimotor deficit. PSYCHIATRIC:  Normal affect, mood,  thought content. SKIN: Warm, dry, and intact without obvious rash, lesion, or ulcer.    Labs on Admission:  CBC: Recent Labs  Lab 06/24/23 1425  WBC 12.2*  HGB 10.7*  HCT 32.3*  MCV 93.6  PLT 364   Basic Metabolic Panel: Recent Labs  Lab 06/24/23 1425  NA 143  K 4.0  CL 108  CO2 22  GLUCOSE 110*  BUN 26*  CREATININE 3.14*  CALCIUM 8.9   GFR: Estimated Creatinine Clearance: 12.8 mL/min (A) (by C-G formula based on SCr of 3.14 mg/dL (H)). Liver Function Tests: No results for input(s): "AST", "ALT", "ALKPHOS", "BILITOT", "PROT", "ALBUMIN" in the last 168 hours. No results for input(s): "LIPASE", "AMYLASE" in the last 168 hours. No results for input(s): "AMMONIA" in the last 168 hours. Coagulation Profile: No results for input(s): "INR", "PROTIME"  in the last 168 hours. Cardiac Enzymes: No results for input(s): "CKTOTAL", "CKMB", "CKMBINDEX", "TROPONINI" in the last 168 hours. BNP (last 3 results) No results for input(s): "PROBNP" in the last 8760 hours. HbA1C: No results for input(s): "HGBA1C" in the last 72 hours. CBG: No results for input(s): "GLUCAP" in the last 168 hours. Lipid Profile: No results for input(s): "CHOL", "HDL", "LDLCALC", "TRIG", "CHOLHDL", "LDLDIRECT" in the last 72 hours. Thyroid Function Tests: No results for input(s): "TSH", "T4TOTAL", "FREET4", "T3FREE", "THYROIDAB" in the last 72 hours. Anemia Panel: No results for input(s): "VITAMINB12", "FOLATE", "FERRITIN", "TIBC", "IRON", "RETICCTPCT" in the last 72 hours. Urine analysis:    Component Value Date/Time   COLORURINE YELLOW 06/24/2023 1513   APPEARANCEUR HAZY (A) 06/24/2023 1513   LABSPEC 1.011 06/24/2023 1513   PHURINE 6.0 06/24/2023 1513   GLUCOSEU NEGATIVE 06/24/2023 1513   HGBUR NEGATIVE 06/24/2023 1513   BILIRUBINUR NEGATIVE 06/24/2023 1513   KETONESUR NEGATIVE 06/24/2023 1513   PROTEINUR 30 (A) 06/24/2023 1513   UROBILINOGEN 0.2 04/27/2015 0834   NITRITE NEGATIVE 06/24/2023  1513   LEUKOCYTESUR MODERATE (A) 06/24/2023 1513   Sepsis Labs: @LABRCNTIP (procalcitonin:4,lacticidven:4) )No results found for this or any previous visit (from the past 240 hour(s)).   Radiological Exams on Admission: CT CHEST ABDOMEN PELVIS WO CONTRAST  Addendum Date: 06/25/2023   ADDENDUM REPORT: 06/25/2023 01:51 ADDENDUM: These results were called by telephone at the time of interpretation on 06/25/2023 at 1:51 am to provider [facility] , who verbally acknowledged these results. Electronically Signed   By: Tish Frederickson M.D.   On: 06/25/2023 01:51   Result Date: 06/25/2023 CLINICAL DATA:  Non-small cell lung cancer (NSCLC), metastatic, assess treatment response. SOB and right leg weakness started around noon yesterday. Pt is eupnei EXAM: CT CHEST, ABDOMEN AND PELVIS WITHOUT CONTRAST TECHNIQUE: Multidetector CT imaging of the chest, abdomen and pelvis was performed following the standard protocol without IV contrast. RADIATION DOSE REDUCTION: This exam was performed according to the departmental dose-optimization program which includes automated exposure control, adjustment of the mA and/or kV according to patient size and/or use of iterative reconstruction technique. COMPARISON:  CT chest 08/29/2020, CT abdomen pelvis 06/04/2023 FINDINGS: CT CHEST FINDINGS Cardiovascular: Enlarged heart size. No significant pericardial effusion. The thoracic aorta is normal in caliber. Severe atherosclerotic plaque of the thoracic aorta. Four-vessel coronary artery calcifications. Mediastinum/Nodes: No gross hilar adenopathy, noting limited sensitivity for the detection of hilar adenopathy on this noncontrast study. no enlarged mediastinal or axillary lymph nodes. Thyroid gland, trachea, and esophagus demonstrate no significant findings. Small to moderate volume hiatal hernia. Lungs/Pleura: Centrilobular emphysematous changes. Slightly more conspicuous right upper lobe 11 x 7 mm pulmonary nodule with surrounding  ground-glass airspace opacity (5:55). Interval development of a right upper lobe spiculated 11 x 7 mm pulmonary nodule (5:79). Slightly more conspicuous area scarring versus cluster of nodularity within the right upper lobe (5:50 5-57). Interval development of a subsolid left upper lobe 8 x 8 mm pulmonary nodule (5:51). Similar scarring at the left apex measuring 10 x 6 mm (5:34). Anteriorly along the left apex there is similar-appearing scarring. Interval development of a left lower lobe subpleural 10 x 10 mm ground-glass pulmonary nodule (5:98). Stable solid 5 mm right middle lobe pulmonary nodule (5:90). No pulmonary mass. No pleural effusion. No pneumothorax. Musculoskeletal: No chest wall abnormality. No suspicious lytic or blastic osseous lesions. No acute displaced fracture. Multilevel degenerative changes of the spine. CT ABDOMEN PELVIS FINDINGS Hepatobiliary: No focal liver abnormality. Calcified gallstones noted  within the gallbladder lumen. No gallbladder wall thickening or pericholecystic fluid. No biliary dilatation. Pancreas: No focal lesion. Normal pancreatic contour. No surrounding inflammatory changes. No main pancreatic ductal dilatation. Spleen: Normal in size without focal abnormality. Adrenals/Urinary Tract: No adrenal nodule bilaterally. No nephrolithiasis and no hydronephrosis. Hyperdense subcentimeter left renal lesion likely a proteinaceous versus hemorrhagic cyst-no further follow-up indicated. Fluid density lesions within bilateral kidneys likely represent simple renal cysts. Simple renal cysts, in the absence of clinically indicated signs/symptoms, require no independent follow-up. No ureterolithiasis or hydroureter. The urinary bladder is unremarkable. Stomach/Bowel: Stomach is within normal limits. No evidence of bowel wall thickening or dilatation. Colonic diverticulosis. The base of the appendix is enlarged in caliber measuring up to 1.1 cm with associated mild peri appendiceal fat  stranding. The tip is not visualized with in its place a difficult to measure approximately 4 cm gas and fluid collection (versus less likely mass) along the expected region of the right ovary. The gas and fluid collection along the right adnexa is noted to be inseparable from the uterus as well as from a short segment of sigmoid colon. Vascular/Lymphatic: No abdominal aorta or iliac aneurysm. Mild atherosclerotic plaque of the aorta and its branches. No abdominal, pelvic, or inguinal lymphadenopathy. Reproductive: Right adnexa demonstrates findings noted in stomach/bowel. Normal left adnexa. Coarsely calcified uterine fibroids are noted. Other: No intraperitoneal free fluid. No intraperitoneal free gas. No organized fluid collection. Musculoskeletal: No abdominal wall hernia or abnormality. No suspicious lytic or blastic osseous lesions. No acute displaced fracture. Multilevel degenerative changes of the spine. IMPRESSION: 1. Likely perforated acute appendicitis with likely developing approximately 4 cm abscess in the right adnexal region in the expected region of the appendiceal tip. Finding is inseparable from the uterus and a short portion of the sigmoid colon. Differential diagnosis includes tubo-ovarian abscess, malignancy, less likely complicated diverticulitis. Markedly limited evaluation on this noncontrast study. 2. New pulmonary nodules in a patient with history of lung cancer (comparison to 2021). Limited evaluation on this noncontrast study. Additional imaging evaluation or consultation with Pulmonology or Thoracic Surgery recommended. 3. Interval development of a subsolid left upper lobe 8 x 8 mm pulmonary nodule. 4. Interval development of a left lower lobe subpleural 10 x 10 mm ground-glass pulmonary nodule . 5. Interval development of a right upper lobe spiculated 11 x 7 mm pulmonary nodule. Slightly more conspicuous right upper lobe 11 x 7 mm pulmonary nodule with surrounding ground-glass airspace  opacity. 6. Slightly more conspicuous area scarring versus cluster of nodularity within the right upper lobe. 7. Small to moderate volume hiatal hernia. 8. Cholelithiasis no CT finding of acute cholecystitis. 9. Diverticulosis. 10. Uterine fibroids. 11. Aortic Atherosclerosis (ICD10-I70.0) including four-vessel coronary calcification. Electronically Signed: By: Tish Frederickson M.D. On: 06/25/2023 01:34   CT T-SPINE NO CHARGE  Result Date: 06/25/2023 CLINICAL DATA:  SOB and right leg weakness started around noon yesterday. EXAM: CT THORACIC AND LUMBAR SPINE WITHOUT CONTRAST TECHNIQUE: Multidetector CT imaging of the thoracic and lumbar spine was performed without contrast. Multiplanar CT image reconstructions were also generated. RADIATION DOSE REDUCTION: This exam was performed according to the departmental dose-optimization program which includes automated exposure control, adjustment of the mA and/or kV according to patient size and/or use of iterative reconstruction technique. COMPARISON:  CT abdomen pelvis 06/04/2023, CT chest 08/29/2020 FINDINGS: CT THORACIC SPINE FINDINGS Alignment: Normal. Vertebrae: No acute fracture or focal pathologic process. Paraspinal and other soft tissues: Negative. Disc levels: Couple level intervertebral disc space vacuum phenomenon.  Otherwise maintained. CT LUMBAR SPINE FINDINGS Segmentation: 5 lumbar type vertebrae. Alignment: Normal. Vertebrae: Multilevel mild to moderate facet arthropathy. No severe osseous neural foraminal or central canal stenosis. No acute fracture or focal pathologic process. Paraspinal and other soft tissues: Negative. Disc levels: Maintained. Other: Moderate severe osseous neural foraminal stenosis at the left C6-C7 level. IMPRESSION: CT THORACIC SPINE IMPRESSION No acute displaced fracture or traumatic listhesis of the thoracic spine. CT LUMBAR SPINE IMPRESSION No acute displaced fracture or traumatic listhesis of the lumbar spine. Electronically  Signed   By: Tish Frederickson M.D.   On: 06/25/2023 01:07   CT L-SPINE NO CHARGE  Result Date: 06/25/2023 CLINICAL DATA:  SOB and right leg weakness started around noon yesterday. EXAM: CT THORACIC AND LUMBAR SPINE WITHOUT CONTRAST TECHNIQUE: Multidetector CT imaging of the thoracic and lumbar spine was performed without contrast. Multiplanar CT image reconstructions were also generated. RADIATION DOSE REDUCTION: This exam was performed according to the departmental dose-optimization program which includes automated exposure control, adjustment of the mA and/or kV according to patient size and/or use of iterative reconstruction technique. COMPARISON:  CT abdomen pelvis 06/04/2023, CT chest 08/29/2020 FINDINGS: CT THORACIC SPINE FINDINGS Alignment: Normal. Vertebrae: No acute fracture or focal pathologic process. Paraspinal and other soft tissues: Negative. Disc levels: Couple level intervertebral disc space vacuum phenomenon. Otherwise maintained. CT LUMBAR SPINE FINDINGS Segmentation: 5 lumbar type vertebrae. Alignment: Normal. Vertebrae: Multilevel mild to moderate facet arthropathy. No severe osseous neural foraminal or central canal stenosis. No acute fracture or focal pathologic process. Paraspinal and other soft tissues: Negative. Disc levels: Maintained. Other: Moderate severe osseous neural foraminal stenosis at the left C6-C7 level. IMPRESSION: CT THORACIC SPINE IMPRESSION No acute displaced fracture or traumatic listhesis of the thoracic spine. CT LUMBAR SPINE IMPRESSION No acute displaced fracture or traumatic listhesis of the lumbar spine. Electronically Signed   By: Tish Frederickson M.D.   On: 06/25/2023 01:07   CT HEAD WO CONTRAST ( )  Result Date: 06/25/2023 CLINICAL DATA:  Memory loss dementia, generalized weakness EXAM: CT HEAD WITHOUT CONTRAST TECHNIQUE: Contiguous axial images were obtained from the base of the skull through the vertex without intravenous contrast. RADIATION DOSE  REDUCTION: This exam was performed according to the departmental dose-optimization program which includes automated exposure control, adjustment of the mA and/or kV according to patient size and/or use of iterative reconstruction technique. COMPARISON:  CT head 03/30/2021 FINDINGS: Brain: Stable cerebral ventricle sizes are concordant with the degree of cerebral volume loss. Patchy and confluent areas of decreased attenuation are noted throughout the deep and periventricular white matter of the cerebral hemispheres bilaterally, compatible with chronic microvascular ischemic disease. Right occipital lobe encephalomalacia. No evidence of large-territorial acute infarction. No parenchymal hemorrhage. No mass lesion. No extra-axial collection. No mass effect or midline shift. No hydrocephalus. Basilar cisterns are patent. Vascular: No hyperdense vessel. Atherosclerotic calcifications are present within the cavernous internal carotid and vertebral arteries. Skull: No acute fracture or focal lesion. Sinuses/Orbits: Paranasal sinuses and mastoid air cells are clear. Left lens replacement. Otherwise the orbits are unremarkable. Other: None. IMPRESSION: No acute intracranial abnormality. Electronically Signed   By: Tish Frederickson M.D.   On: 06/25/2023 00:54   DG Chest 2 View  Result Date: 06/24/2023 CLINICAL DATA:  Shortness of breath and extremity weakness EXAM: CHEST - 2 VIEW COMPARISON:  Chest radiograph dated 06/03/2023 FINDINGS: Lines/tubes: Left chest wall pacemaker leads project over the right atrium and ventricle and tributary of the coronary sinus. Lungs: Patient is rotated slightly  to the right. Unchanged elevation of the right hemidiaphragm. Slightly low lung volumes. No focal consolidations. Patchy left basilar opacities. Pleura: No pneumothorax or pleural effusion. Heart/mediastinum: The heart size and mediastinal contours are within normal limits. Bones: No acute osseous abnormality. IMPRESSION: Slightly  low lung volumes with patchy left basilar opacities, which may represent atelectasis, aspiration, or pneumonia. Electronically Signed   By: Agustin Cree M.D.   On: 06/24/2023 16:33    EKG: Paced at 72 bpm  Assessment/Plan  This is a 77 y.o. female with a history of acute combined systolic and diastolic heart failure, CKD stage IV, COPD, dementia, GERD, hypertension, hyperlipidemia, non-small cell carcinoma of the lung, CAD status post MI, pacemaker, now being admitted with:  #.  Perforated appendicitis -Admit inpatient - Continue IV Zosyn and IV fluids - Management per surgery - Pain control -repeat CT in 3 to 5 days, cannot have MRI due to pacemaker -Follow-up stool for C. difficile  #. Acute kidney injury on CKD 4 - IV fluids and repeat BMP in AM.  -Continue sodium bicarbonate, vitamin D, multivitamin - Avoid nephrotoxic medications  #.  Possible urinary tract infection - Continue IV Zosyn - Follow-up urine culture  #.  New pulmonary nodules in the setting of non-small cell lung cancer -Will need follow-up with oncology for further workup if desired by family  #. History of acute combined systolic and diastolic heart failure - Continue metoprolol, atorvastatin, isosorbide dinitrate  #. History of CAD status post MI - Continue atorvastatin, nitroglycerin, aspirin  #. History of COPD - Continue O2 and nebs as needed, Claritin  #. History of GERD - Continue Protonix  #.  History of dementia -Continue donepezil and memantine   Admission status: Inpatient, telemetry IV Fluids: LR Diet/Nutrition: Heart healthy Consults called: General Surgery DVT Px: Lovenox, SCDs and early ambulation. Code Status: Full Code  Disposition Plan: To home in 1-2 days  All the records are reviewed and case discussed with ED provider. Management plans discussed with the patient and/or family who express understanding and agree with plan of care.  Tonye Royalty D.O. on 06/25/2023 at 9:28  AM CC: Primary care physician; Hyacinth Meeker, [place], [place]   06/25/2023, 9:28 AM

## 2023-06-26 DIAGNOSIS — K3532 Acute appendicitis with perforation and localized peritonitis, without abscess: Secondary | ICD-10-CM | POA: Diagnosis not present

## 2023-06-26 DIAGNOSIS — I10 Essential (primary) hypertension: Secondary | ICD-10-CM

## 2023-06-26 LAB — COMPREHENSIVE METABOLIC PANEL
ALT: 13 U/L (ref 0–44)
AST: 19 U/L (ref 15–41)
Albumin: 2.9 g/dL — ABNORMAL LOW (ref 3.5–5.0)
Alkaline Phosphatase: 73 U/L (ref 38–126)
Anion gap: 11 (ref 5–15)
BUN: 38 mg/dL — ABNORMAL HIGH (ref 8–23)
CO2: 22 mmol/L (ref 22–32)
Calcium: 8.4 mg/dL — ABNORMAL LOW (ref 8.9–10.3)
Chloride: 107 mmol/L (ref 98–111)
Creatinine, Ser: 3.84 mg/dL — ABNORMAL HIGH (ref 0.44–1.00)
GFR, Estimated: 11 mL/min — ABNORMAL LOW (ref >60)
Glucose, Bld: 114 mg/dL — ABNORMAL HIGH (ref 70–99)
Potassium: 4.4 mmol/L (ref 3.5–5.1)
Sodium: 140 mmol/L (ref 135–145)
Total Bilirubin: 0.5 mg/dL (ref 0.3–1.2)
Total Protein: 6.6 g/dL (ref 6.5–8.1)

## 2023-06-26 LAB — CBC
HCT: 30.9 % — ABNORMAL LOW (ref 36.0–46.0)
Hemoglobin: 10.2 g/dL — ABNORMAL LOW (ref 12.0–15.0)
MCH: 30.6 pg (ref 26.0–34.0)
MCHC: 33 g/dL (ref 30.0–36.0)
MCV: 92.8 fL (ref 80.0–100.0)
Platelets: 333 10*3/uL (ref 150–400)
RBC: 3.33 MIL/uL — ABNORMAL LOW (ref 3.87–5.11)
RDW: 12.8 % (ref 11.5–15.5)
WBC: 8.2 10*3/uL (ref 4.0–10.5)
nRBC: 0 % (ref 0.0–0.2)

## 2023-06-26 LAB — C DIFFICILE QUICK SCREEN W PCR REFLEX
C Diff antigen: NEGATIVE
C Diff interpretation: NOT DETECTED
C Diff toxin: NEGATIVE

## 2023-06-26 NOTE — Progress Notes (Signed)
Pt has refused blood draw several attempts. Keeps saying maybe later. Will keep attempting

## 2023-06-26 NOTE — Progress Notes (Signed)
Mobility Specialist: Progress Note   06/26/23 1239  Mobility  Activity Ambulated with assistance in hallway  Level of Assistance Contact guard assist, steadying assist  Assistive Device Front wheel walker  Distance Ambulated (ft) 100 ft  Activity Response Tolerated well  Mobility Referral Yes  $Mobility charge 1 Mobility  Mobility Specialist Start Time (ACUTE ONLY) 1210  Mobility Specialist Stop Time (ACUTE ONLY) 1235  Mobility Specialist Time Calculation (min) (ACUTE ONLY) 25 min    Pt was agreeable to mobility session - received in bed. SV for bed mobility, minA for STS and ambulation. Pt had a BM in bed - ambulated to the bathroom while mobility specialist changed bed mats. Required 1x seated break during session (~30 sec) - had c/o SOB; denies any fatigue or dizziness. Returned to room without fault. Left in bed with all needs met, call bell in reach.   Maurene Capes Mobility Specialist Please contact via SecureChat or Rehab office at [phone]

## 2023-06-26 NOTE — TOC Initial Note (Signed)
Transition of Care [facility]) - Initial/Assessment Note    Patient Details  Name: Betty [name] MRN: [id number] Date of Birth: 17-Dec-1945  Transition of Care [facility]) CM/SW Contact:    Kingsley Plan, RN Phone Number: 06/26/2023, 3:13 PM  Clinical Narrative:                  Spoke to patient's son Betty [name] in hallway. Patient from home alone. He is caregiver. Patient has walker and bedside commode at home.   Son reports he did not receive a call from [facility] from last discharge. Messaged Cory   Will await PT eval  Expected Discharge Plan:  (await PT eval) Barriers to Discharge: Continued Medical Work up   Patient Goals and CMS Choice Patient states their goals for this hospitalization and ongoing recovery are:: to return to home          Expected Discharge Plan and Services   Discharge Planning Services: CM Consult   Living arrangements for the past 2 months: Single Family Home                 DME Arranged:  (await PT eval)                    Prior Living Arrangements/Services Living arrangements for the past 2 months: Single Family Home Lives with:: Self Patient language and need for interpreter reviewed:: Yes            Current home services: DME Criminal Activity/Legal Involvement Pertinent to Current Situation/Hospitalization: No - Comment as needed  Activities of Daily Living Home Assistive Devices/Equipment: Cane (specify quad or straight) ADL Screening (condition at time of admission) Patient's cognitive ability adequate to safely complete daily activities?: No Is the patient deaf or have difficulty hearing?: No Does the patient have difficulty seeing, even when wearing glasses/contacts?: No Does the patient have difficulty concentrating, remembering, or making decisions?: Yes Patient able to express need for assistance with ADLs?: No Does the patient have difficulty dressing or bathing?: Yes Independently performs ADLs?: No Communication:  Independent Dressing (OT): Needs assistance Is this a change from baseline?: Pre-admission baseline Grooming: Needs assistance Is this a change from baseline?: Pre-admission baseline Feeding: Independent Bathing: Needs assistance Is this a change from baseline?: Pre-admission baseline Toileting: Needs assistance Is this a change from baseline?: Pre-admission baseline In/Out Bed: Needs assistance Is this a change from baseline?: Pre-admission baseline Walks in Home: Needs assistance Is this a change from baseline?: Pre-admission baseline Does the patient have difficulty walking or climbing stairs?: No Weakness of Legs: Both Weakness of Arms/Hands: None  Permission Sought/Granted                  Emotional Assessment              Admission diagnosis:  Perforated appendicitis [K35.32] Patient Active Problem List   Diagnosis Date Noted   Perforated appendicitis 06/25/2023   Acute metabolic encephalopathy 06/04/2023   Abdominal pain 06/04/2023   Abdominal mass 06/04/2023   Allergic rhinitis 12/14/2021   Bilateral pleural effusion 12/14/2021   Cardiac pacemaker in situ 12/14/2021   Coronary artery disease 12/14/2021   Esophagitis, unspecified with bleeding 12/14/2021   Gastro-esophageal reflux disease without esophagitis 12/14/2021   Gout 12/14/2021   Hardening of the aorta (main artery of the heart) (HCC) 12/14/2021   Left knee pain 12/14/2021   Malignant neoplasm of lower lobe, unspecified bronchus or lung (HCC) 12/14/2021   Mixed hyperlipidemia 12/14/2021  Multi-infarct dementia, uncomplicated (HCC) 12/14/2021   Personal history of colonic polyps 12/14/2021   Primary localized osteoarthritis of pelvic region and thigh 12/14/2021   Tobacco user 12/14/2021   Vitamin D deficiency 12/14/2021   CKD (chronic kidney disease) stage 5, GFR less than 15 ml/min (HCC) 11/14/2021   GI bleed 06/24/2019   Hypotension 06/24/2019   Electrolyte abnormality 06/24/2019    Pressure injury of skin 05/29/2019   Altered mental status    Acute respiratory failure with hypoxemia (HCC)    COVID-19 05/14/2019   Essential hypertension 05/14/2019   Dementia (HCC) 07/16/2018   Cerebral aneurysm, nonruptured 07/16/2018   Nonischemic cardiomyopathy (HCC) 05/21/2018   Congestive heart failure, NYHA class 3, chronic, systolic (HCC) 05/21/2018   Chronic systolic heart failure (HCC)    Chest pain 04/22/2018   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   NSTEMI (non-ST elevated myocardial infarction) (HCC) 03/24/2018   Pure hypercholesterolemia    CKD (chronic kidney disease), stage IV (HCC)    Non-small cell carcinoma of right lung, stage 1 (HCC) 05/17/2015   S/P lobectomy of lung 04/29/2015   PCP:  Collene Mares, PA Pharmacy:   [facility] [phone] [place], [place] - [address] AT [facility] [address] [facility] [address] [place] [address] Phone: [phone] Fax: [phone]     Social Determinants of Health (SDOH) Social History: SDOH Screenings   Food Insecurity: No Food Insecurity (06/25/2023)  Housing: Low Risk  (06/25/2023)  Transportation Needs: No Transportation Needs (06/25/2023)  Utilities: Not At Risk (06/25/2023)  Alcohol Screen: Low Risk  (11/28/2021)  Depression (PHQ2-9): Low Risk  (11/28/2021)  Financial Resource Strain: Low Risk  (11/28/2021)  Physical Activity: Inactive (11/28/2021)  Social Connections: Unknown (11/28/2021)  Stress: No Stress Concern Present (11/28/2021)  Tobacco Use: Medium Risk (06/24/2023)   SDOH Interventions:     Readmission Risk Interventions     No data to display

## 2023-06-26 NOTE — Progress Notes (Signed)
[facility]                                                                               Betty [name], is a 77 y.o. female, DOB - 11-26-1945, FAO:[id number] Admit date - 06/24/2023    Outpatient Primary MD for the patient is [place], [place], [place]  LOS - 1  days    Brief summary   Betty [name] is a 77 y.o. female with a known history of acute combined systolic and diastolic heart failure, CKD stage IV, COPD, dementia, GERD, hypertension, hyperlipidemia, non-small cell carcinoma of the lung, CAD status post MI, pacemaker, presents to the emergency department for evaluation of shortness of breath.  Patient son found her today with unsteady gait, shortness of breath, dyspnea on exertion and confused . She was found to have perforated appendicitis.    Assessment & Plan    Assessment and Plan:    Perforated appendicitis - admitted for IV antibiotics and IV fluids.  - further management as per general surgery.  - Pain control -repeat CT in 3 to 5 days, cannot have MRI due to pacemaker -Follow-up stool for C. Difficile. Abd exam is unreliable.    #. Acute kidney injury on CKD 4 Hydrate and repeat renal parameters in am.     Possible urinary tract infection - Continue IV Zosyn - cultures are pending.     New pulmonary nodules in the setting of non-small cell lung cancer -Will need follow-up with oncology for further workup if desired by family   History of acute combined systolic and diastolic heart failure - Continue metoprolol, atorvastatin, isosorbide dinitrate   History of CAD status post MI - Continue atorvastatin, nitroglycerin, aspirin    History of COPD  No wheezing heard.  - Continue O2 and nebs as needed, Claritin    History of GERD Stable.     History of dementia -Continue donepezil and memantine        Estimated body mass index is 21.12 kg/m as calculated from the following:   Height as of this encounter: 5\' 4"  (1.626 m).   Weight as of  this encounter: 55.8 kg.  Code Status: full code.  DVT Prophylaxis:  enoxaparin (LOVENOX) injection 30 mg Start: 06/25/23 2200 SCDs Start: 06/25/23 1124   Level of Care: Level of care: Telemetry Medical Family Communication: none at bedside.   Consultants:   Surgery.   Antimicrobials:   Anti-infectives (From admission, onward)    Start     Dose/Rate Route Frequency Ordered Stop   06/25/23 1030  piperacillin-tazobactam (ZOSYN) IVPB 2.25 g        2.25 g 100 mL/hr over 30 Minutes Intravenous Every 8 hours 06/25/23 0945     06/25/23 0200  piperacillin-tazobactam (ZOSYN) IVPB 3.375 g        3.375 g 100 mL/hr over 30 Minutes Intravenous  Once 06/25/23 0158 06/25/23 0247        Medications  Scheduled Meds:  aspirin EC  81 mg Oral QHS   atorvastatin  40 mg Oral Daily   cholecalciferol  1,000 Units Oral QHS   donepezil  10 mg Oral QHS   enoxaparin (LOVENOX) injection  30 mg Subcutaneous Q24H   febuxostat  40 mg Oral QHS   hydrALAZINE  50 mg Oral Q8H   isosorbide dinitrate  20 mg Oral TID   loratadine  10 mg Oral Daily   memantine  10 mg Oral BID   metoprolol succinate  50 mg Oral Daily   multivitamin with minerals  0.5 tablet Oral QHS   pantoprazole  40 mg Oral q morning   QUEtiapine  25 mg Oral QHS   sodium bicarbonate  650 mg Oral BID   sodium chloride flush  3 mL Intravenous Q12H   Continuous Infusions:  lactated ringers Stopped (06/25/23 1454)   piperacillin-tazobactam (ZOSYN)  IV 2.25 g (06/26/23 0645)   PRN Meds:.acetaminophen **OR** acetaminophen, bisacodyl, hydrALAZINE, HYDROcodone-acetaminophen, ipratropium-albuterol, morphine injection, nitroGLYCERIN, ondansetron **OR** ondansetron (ZOFRAN) IV, senna-docusate, traZODone    Subjective:   Betty [name] was seen and examined today.  Denies any abdominal pain.   Objective:   Vitals:   06/25/23 1604 06/25/23 1940 06/26/23 0538 06/26/23 0850  BP: 131/62 (!) 121/56 (!) 134/54 136/66  Pulse: 67 73 64 61  Resp:  17 18 18 18   Temp: 98.2 F (36.8 C) 98.5 F (36.9 C) 97.9 F (36.6 C) 98.3 F (36.8 C)  TempSrc: Oral Oral Oral Oral  SpO2: 100% 95% 99% 100%  Weight:      Height:        Intake/Output Summary (Last 24 hours) at 06/26/2023 1429 Last data filed at 06/26/2023 0900 Gross per 24 hour  Intake 660.18 ml  Output 2 ml  Net 658.18 ml   Filed Weights   06/24/23 1413  Weight: 55.8 kg     Exam General: Alert , comfortable,  Cardiovascular: S1 S2 auscultated, no murmurs, RRR Respiratory: Clear to auscultation bilaterally, no wheezing, rales or rhonchi Gastrointestinal: Soft, nontender, nondistended, + bowel sounds Ext: no pedal edema bilaterally Neuro: Aler and oriented to person only.   Skin: No rashes    Data Reviewed:  I have personally reviewed following labs and imaging studies   CBC Lab Results  Component Value Date   WBC 12.2 (H) 06/24/2023   RBC 3.45 (L) 06/24/2023   HGB 10.7 (L) 06/24/2023   HCT 32.3 (L) 06/24/2023   MCV 93.6 06/24/2023   MCH 31.0 06/24/2023   PLT 364 06/24/2023   MCHC 33.1 06/24/2023   RDW 12.9 06/24/2023   LYMPHSABS 1.2 06/04/2023   MONOABS 0.7 06/04/2023   EOSABS 0.0 06/04/2023   BASOSABS 0.0 06/04/2023     Last metabolic panel Lab Results  Component Value Date   NA 143 06/24/2023   K 4.0 06/24/2023   CL 108 06/24/2023   CO2 22 06/24/2023   BUN 26 (H) 06/24/2023   CREATININE 3.14 (H) 06/24/2023   GLUCOSE 110 (H) 06/24/2023   GFRNONAA 15 (L) 06/24/2023   GFRAA 23 (L) 08/11/2019   CALCIUM 8.9 06/24/2023   PHOS 3.0 07/02/2019   PROT 5.7 (L) 06/04/2023   ALBUMIN 2.6 (L) 06/04/2023   BILITOT 0.5 06/04/2023   ALKPHOS 73 06/04/2023   AST 15 06/04/2023   ALT 13 06/04/2023   ANIONGAP 13 06/24/2023    CBG (last 3)  No results for input(s): "GLUCAP" in the last 72 hours.    Coagulation Profile: No results for input(s): "INR", "PROTIME" in the last 168 hours.   Radiology Studies: CT CHEST ABDOMEN PELVIS WO  CONTRAST  Addendum Date: 06/25/2023   ADDENDUM REPORT: 06/25/2023 01:51 ADDENDUM: These results were called by telephone at the time  of interpretation on 06/25/2023 at 1:51 am to provider [facility] , who verbally acknowledged these results. Electronically Signed   By: Tish Frederickson M.D.   On: 06/25/2023 01:51   Result Date: 06/25/2023 CLINICAL DATA:  Non-small cell lung cancer (NSCLC), metastatic, assess treatment response. SOB and right leg weakness started around noon yesterday. Pt is eupnei EXAM: CT CHEST, ABDOMEN AND PELVIS WITHOUT CONTRAST TECHNIQUE: Multidetector CT imaging of the chest, abdomen and pelvis was performed following the standard protocol without IV contrast. RADIATION DOSE REDUCTION: This exam was performed according to the departmental dose-optimization program which includes automated exposure control, adjustment of the mA and/or kV according to patient size and/or use of iterative reconstruction technique. COMPARISON:  CT chest 08/29/2020, CT abdomen pelvis 06/04/2023 FINDINGS: CT CHEST FINDINGS Cardiovascular: Enlarged heart size. No significant pericardial effusion. The thoracic aorta is normal in caliber. Severe atherosclerotic plaque of the thoracic aorta. Four-vessel coronary artery calcifications. Mediastinum/Nodes: No gross hilar adenopathy, noting limited sensitivity for the detection of hilar adenopathy on this noncontrast study. no enlarged mediastinal or axillary lymph nodes. Thyroid gland, trachea, and esophagus demonstrate no significant findings. Small to moderate volume hiatal hernia. Lungs/Pleura: Centrilobular emphysematous changes. Slightly more conspicuous right upper lobe 11 x 7 mm pulmonary nodule with surrounding ground-glass airspace opacity (5:55). Interval development of a right upper lobe spiculated 11 x 7 mm pulmonary nodule (5:79). Slightly more conspicuous area scarring versus cluster of nodularity within the right upper lobe (5:50 5-57). Interval  development of a subsolid left upper lobe 8 x 8 mm pulmonary nodule (5:51). Similar scarring at the left apex measuring 10 x 6 mm (5:34). Anteriorly along the left apex there is similar-appearing scarring. Interval development of a left lower lobe subpleural 10 x 10 mm ground-glass pulmonary nodule (5:98). Stable solid 5 mm right middle lobe pulmonary nodule (5:90). No pulmonary mass. No pleural effusion. No pneumothorax. Musculoskeletal: No chest wall abnormality. No suspicious lytic or blastic osseous lesions. No acute displaced fracture. Multilevel degenerative changes of the spine. CT ABDOMEN PELVIS FINDINGS Hepatobiliary: No focal liver abnormality. Calcified gallstones noted within the gallbladder lumen. No gallbladder wall thickening or pericholecystic fluid. No biliary dilatation. Pancreas: No focal lesion. Normal pancreatic contour. No surrounding inflammatory changes. No main pancreatic ductal dilatation. Spleen: Normal in size without focal abnormality. Adrenals/Urinary Tract: No adrenal nodule bilaterally. No nephrolithiasis and no hydronephrosis. Hyperdense subcentimeter left renal lesion likely a proteinaceous versus hemorrhagic cyst-no further follow-up indicated. Fluid density lesions within bilateral kidneys likely represent simple renal cysts. Simple renal cysts, in the absence of clinically indicated signs/symptoms, require no independent follow-up. No ureterolithiasis or hydroureter. The urinary bladder is unremarkable. Stomach/Bowel: Stomach is within normal limits. No evidence of bowel wall thickening or dilatation. Colonic diverticulosis. The base of the appendix is enlarged in caliber measuring up to 1.1 cm with associated mild peri appendiceal fat stranding. The tip is not visualized with in its place a difficult to measure approximately 4 cm gas and fluid collection (versus less likely mass) along the expected region of the right ovary. The gas and fluid collection along the right adnexa is  noted to be inseparable from the uterus as well as from a short segment of sigmoid colon. Vascular/Lymphatic: No abdominal aorta or iliac aneurysm. Mild atherosclerotic plaque of the aorta and its branches. No abdominal, pelvic, or inguinal lymphadenopathy. Reproductive: Right adnexa demonstrates findings noted in stomach/bowel. Normal left adnexa. Coarsely calcified uterine fibroids are noted. Other: No intraperitoneal free fluid. No intraperitoneal free gas. No  organized fluid collection. Musculoskeletal: No abdominal wall hernia or abnormality. No suspicious lytic or blastic osseous lesions. No acute displaced fracture. Multilevel degenerative changes of the spine. IMPRESSION: 1. Likely perforated acute appendicitis with likely developing approximately 4 cm abscess in the right adnexal region in the expected region of the appendiceal tip. Finding is inseparable from the uterus and a short portion of the sigmoid colon. Differential diagnosis includes tubo-ovarian abscess, malignancy, less likely complicated diverticulitis. Markedly limited evaluation on this noncontrast study. 2. New pulmonary nodules in a patient with history of lung cancer (comparison to 2021). Limited evaluation on this noncontrast study. Additional imaging evaluation or consultation with Pulmonology or Thoracic Surgery recommended. 3. Interval development of a subsolid left upper lobe 8 x 8 mm pulmonary nodule. 4. Interval development of a left lower lobe subpleural 10 x 10 mm ground-glass pulmonary nodule . 5. Interval development of a right upper lobe spiculated 11 x 7 mm pulmonary nodule. Slightly more conspicuous right upper lobe 11 x 7 mm pulmonary nodule with surrounding ground-glass airspace opacity. 6. Slightly more conspicuous area scarring versus cluster of nodularity within the right upper lobe. 7. Small to moderate volume hiatal hernia. 8. Cholelithiasis no CT finding of acute cholecystitis. 9. Diverticulosis. 10. Uterine fibroids.  11. Aortic Atherosclerosis (ICD10-I70.0) including four-vessel coronary calcification. Electronically Signed: By: Tish Frederickson M.D. On: 06/25/2023 01:34   CT T-SPINE NO CHARGE  Result Date: 06/25/2023 CLINICAL DATA:  SOB and right leg weakness started around noon yesterday. EXAM: CT THORACIC AND LUMBAR SPINE WITHOUT CONTRAST TECHNIQUE: Multidetector CT imaging of the thoracic and lumbar spine was performed without contrast. Multiplanar CT image reconstructions were also generated. RADIATION DOSE REDUCTION: This exam was performed according to the departmental dose-optimization program which includes automated exposure control, adjustment of the mA and/or kV according to patient size and/or use of iterative reconstruction technique. COMPARISON:  CT abdomen pelvis 06/04/2023, CT chest 08/29/2020 FINDINGS: CT THORACIC SPINE FINDINGS Alignment: Normal. Vertebrae: No acute fracture or focal pathologic process. Paraspinal and other soft tissues: Negative. Disc levels: Couple level intervertebral disc space vacuum phenomenon. Otherwise maintained. CT LUMBAR SPINE FINDINGS Segmentation: 5 lumbar type vertebrae. Alignment: Normal. Vertebrae: Multilevel mild to moderate facet arthropathy. No severe osseous neural foraminal or central canal stenosis. No acute fracture or focal pathologic process. Paraspinal and other soft tissues: Negative. Disc levels: Maintained. Other: Moderate severe osseous neural foraminal stenosis at the left C6-C7 level. IMPRESSION: CT THORACIC SPINE IMPRESSION No acute displaced fracture or traumatic listhesis of the thoracic spine. CT LUMBAR SPINE IMPRESSION No acute displaced fracture or traumatic listhesis of the lumbar spine. Electronically Signed   By: Tish Frederickson M.D.   On: 06/25/2023 01:07   CT L-SPINE NO CHARGE  Result Date: 06/25/2023 CLINICAL DATA:  SOB and right leg weakness started around noon yesterday. EXAM: CT THORACIC AND LUMBAR SPINE WITHOUT CONTRAST TECHNIQUE:  Multidetector CT imaging of the thoracic and lumbar spine was performed without contrast. Multiplanar CT image reconstructions were also generated. RADIATION DOSE REDUCTION: This exam was performed according to the departmental dose-optimization program which includes automated exposure control, adjustment of the mA and/or kV according to patient size and/or use of iterative reconstruction technique. COMPARISON:  CT abdomen pelvis 06/04/2023, CT chest 08/29/2020 FINDINGS: CT THORACIC SPINE FINDINGS Alignment: Normal. Vertebrae: No acute fracture or focal pathologic process. Paraspinal and other soft tissues: Negative. Disc levels: Couple level intervertebral disc space vacuum phenomenon. Otherwise maintained. CT LUMBAR SPINE FINDINGS Segmentation: 5 lumbar type vertebrae. Alignment: Normal. Vertebrae: Multilevel  mild to moderate facet arthropathy. No severe osseous neural foraminal or central canal stenosis. No acute fracture or focal pathologic process. Paraspinal and other soft tissues: Negative. Disc levels: Maintained. Other: Moderate severe osseous neural foraminal stenosis at the left C6-C7 level. IMPRESSION: CT THORACIC SPINE IMPRESSION No acute displaced fracture or traumatic listhesis of the thoracic spine. CT LUMBAR SPINE IMPRESSION No acute displaced fracture or traumatic listhesis of the lumbar spine. Electronically Signed   By: Tish Frederickson M.D.   On: 06/25/2023 01:07   CT HEAD WO CONTRAST ( )  Result Date: 06/25/2023 CLINICAL DATA:  Memory loss dementia, generalized weakness EXAM: CT HEAD WITHOUT CONTRAST TECHNIQUE: Contiguous axial images were obtained from the base of the skull through the vertex without intravenous contrast. RADIATION DOSE REDUCTION: This exam was performed according to the departmental dose-optimization program which includes automated exposure control, adjustment of the mA and/or kV according to patient size and/or use of iterative reconstruction technique. COMPARISON:   CT head 03/30/2021 FINDINGS: Brain: Stable cerebral ventricle sizes are concordant with the degree of cerebral volume loss. Patchy and confluent areas of decreased attenuation are noted throughout the deep and periventricular white matter of the cerebral hemispheres bilaterally, compatible with chronic microvascular ischemic disease. Right occipital lobe encephalomalacia. No evidence of large-territorial acute infarction. No parenchymal hemorrhage. No mass lesion. No extra-axial collection. No mass effect or midline shift. No hydrocephalus. Basilar cisterns are patent. Vascular: No hyperdense vessel. Atherosclerotic calcifications are present within the cavernous internal carotid and vertebral arteries. Skull: No acute fracture or focal lesion. Sinuses/Orbits: Paranasal sinuses and mastoid air cells are clear. Left lens replacement. Otherwise the orbits are unremarkable. Other: None. IMPRESSION: No acute intracranial abnormality. Electronically Signed   By: Tish Frederickson M.D.   On: 06/25/2023 00:54   DG Chest 2 View  Result Date: 06/24/2023 CLINICAL DATA:  Shortness of breath and extremity weakness EXAM: CHEST - 2 VIEW COMPARISON:  Chest radiograph dated 06/03/2023 FINDINGS: Lines/tubes: Left chest wall pacemaker leads project over the right atrium and ventricle and tributary of the coronary sinus. Lungs: Patient is rotated slightly to the right. Unchanged elevation of the right hemidiaphragm. Slightly low lung volumes. No focal consolidations. Patchy left basilar opacities. Pleura: No pneumothorax or pleural effusion. Heart/mediastinum: The heart size and mediastinal contours are within normal limits. Bones: No acute osseous abnormality. IMPRESSION: Slightly low lung volumes with patchy left basilar opacities, which may represent atelectasis, aspiration, or pneumonia. Electronically Signed   By: Agustin Cree M.D.   On: 06/24/2023 16:33       Kathlen Mody M.D. [facility] 06/26/2023, 2:29  PM  Available via Epic secure chat 7am-7pm After 7 pm, please refer to night coverage provider listed on amion.

## 2023-06-26 NOTE — Progress Notes (Signed)
Subjective/Chief Complaint: Pt doing well Min pain Tol PO   Objective: Vital signs in last 24 hours: Temp:  [97.9 F (36.6 C)-98.5 F (36.9 C)] 97.9 F (36.6 C) (08/21 0538) Pulse Rate:  [63-73] 64 (08/21 0538) Resp:  [17-18] 18 (08/21 0538) BP: (121-138)/(54-66) 134/54 (08/21 0538) SpO2:  [95 %-100 %] 99 % (08/21 0538) Last BM Date : 06/25/23  Intake/Output from previous day: 08/20 0701 - 08/21 0700 In: 180.2 [I.V.:0; IV Piggyback:180.2] Out: -  Intake/Output this shift: No intake/output data recorded.  PE:  Constitutional: No acute distress, conversant, appears states age. Eyes: Anicteric sclerae, moist conjunctiva, no lid lag Lungs: Clear to auscultation bilaterally, normal respiratory effort CV: regular rate and rhythm, no murmurs, no peripheral edema, pedal pulses 2+ GI: Soft, no masses or hepatosplenomegaly, non-tender to palpation Skin: No rashes, palpation reveals normal turgor Psychiatric: appropriate judgment and insight, oriented to person, place, and time   Lab Results:  Recent Labs    06/24/23 1425  WBC 12.2*  HGB 10.7*  HCT 32.3*  PLT 364   BMET Recent Labs    06/24/23 1425  NA 143  K 4.0  CL 108  CO2 22  GLUCOSE 110*  BUN 26*  CREATININE 3.14*  CALCIUM 8.9   PT/INR No results for input(s): "LABPROT", "INR" in the last 72 hours. ABG No results for input(s): "PHART", "HCO3" in the last 72 hours.  Invalid input(s): "PCO2", "PO2"  Studies/Results: CT CHEST ABDOMEN PELVIS WO CONTRAST  Addendum Date: 06/25/2023   ADDENDUM REPORT: 06/25/2023 01:51 ADDENDUM: These results were called by telephone at the time of interpretation on 06/25/2023 at 1:51 am to provider [facility] , who verbally acknowledged these results. Electronically Signed   By: Tish Frederickson M.D.   On: 06/25/2023 01:51   Result Date: 06/25/2023 CLINICAL DATA:  Non-small cell lung cancer (NSCLC), metastatic, assess treatment response. SOB and right leg weakness  started around noon yesterday. Pt is eupnei EXAM: CT CHEST, ABDOMEN AND PELVIS WITHOUT CONTRAST TECHNIQUE: Multidetector CT imaging of the chest, abdomen and pelvis was performed following the standard protocol without IV contrast. RADIATION DOSE REDUCTION: This exam was performed according to the departmental dose-optimization program which includes automated exposure control, adjustment of the mA and/or kV according to patient size and/or use of iterative reconstruction technique. COMPARISON:  CT chest 08/29/2020, CT abdomen pelvis 06/04/2023 FINDINGS: CT CHEST FINDINGS Cardiovascular: Enlarged heart size. No significant pericardial effusion. The thoracic aorta is normal in caliber. Severe atherosclerotic plaque of the thoracic aorta. Four-vessel coronary artery calcifications. Mediastinum/Nodes: No gross hilar adenopathy, noting limited sensitivity for the detection of hilar adenopathy on this noncontrast study. no enlarged mediastinal or axillary lymph nodes. Thyroid gland, trachea, and esophagus demonstrate no significant findings. Small to moderate volume hiatal hernia. Lungs/Pleura: Centrilobular emphysematous changes. Slightly more conspicuous right upper lobe 11 x 7 mm pulmonary nodule with surrounding ground-glass airspace opacity (5:55). Interval development of a right upper lobe spiculated 11 x 7 mm pulmonary nodule (5:79). Slightly more conspicuous area scarring versus cluster of nodularity within the right upper lobe (5:50 5-57). Interval development of a subsolid left upper lobe 8 x 8 mm pulmonary nodule (5:51). Similar scarring at the left apex measuring 10 x 6 mm (5:34). Anteriorly along the left apex there is similar-appearing scarring. Interval development of a left lower lobe subpleural 10 x 10 mm ground-glass pulmonary nodule (5:98). Stable solid 5 mm right middle lobe pulmonary nodule (5:90). No pulmonary mass. No pleural effusion. No pneumothorax. Musculoskeletal:  No chest wall abnormality. No  suspicious lytic or blastic osseous lesions. No acute displaced fracture. Multilevel degenerative changes of the spine. CT ABDOMEN PELVIS FINDINGS Hepatobiliary: No focal liver abnormality. Calcified gallstones noted within the gallbladder lumen. No gallbladder wall thickening or pericholecystic fluid. No biliary dilatation. Pancreas: No focal lesion. Normal pancreatic contour. No surrounding inflammatory changes. No main pancreatic ductal dilatation. Spleen: Normal in size without focal abnormality. Adrenals/Urinary Tract: No adrenal nodule bilaterally. No nephrolithiasis and no hydronephrosis. Hyperdense subcentimeter left renal lesion likely a proteinaceous versus hemorrhagic cyst-no further follow-up indicated. Fluid density lesions within bilateral kidneys likely represent simple renal cysts. Simple renal cysts, in the absence of clinically indicated signs/symptoms, require no independent follow-up. No ureterolithiasis or hydroureter. The urinary bladder is unremarkable. Stomach/Bowel: Stomach is within normal limits. No evidence of bowel wall thickening or dilatation. Colonic diverticulosis. The base of the appendix is enlarged in caliber measuring up to 1.1 cm with associated mild peri appendiceal fat stranding. The tip is not visualized with in its place a difficult to measure approximately 4 cm gas and fluid collection (versus less likely mass) along the expected region of the right ovary. The gas and fluid collection along the right adnexa is noted to be inseparable from the uterus as well as from a short segment of sigmoid colon. Vascular/Lymphatic: No abdominal aorta or iliac aneurysm. Mild atherosclerotic plaque of the aorta and its branches. No abdominal, pelvic, or inguinal lymphadenopathy. Reproductive: Right adnexa demonstrates findings noted in stomach/bowel. Normal left adnexa. Coarsely calcified uterine fibroids are noted. Other: No intraperitoneal free fluid. No intraperitoneal free gas. No  organized fluid collection. Musculoskeletal: No abdominal wall hernia or abnormality. No suspicious lytic or blastic osseous lesions. No acute displaced fracture. Multilevel degenerative changes of the spine. IMPRESSION: 1. Likely perforated acute appendicitis with likely developing approximately 4 cm abscess in the right adnexal region in the expected region of the appendiceal tip. Finding is inseparable from the uterus and a short portion of the sigmoid colon. Differential diagnosis includes tubo-ovarian abscess, malignancy, less likely complicated diverticulitis. Markedly limited evaluation on this noncontrast study. 2. New pulmonary nodules in a patient with history of lung cancer (comparison to 2021). Limited evaluation on this noncontrast study. Additional imaging evaluation or consultation with Pulmonology or Thoracic Surgery recommended. 3. Interval development of a subsolid left upper lobe 8 x 8 mm pulmonary nodule. 4. Interval development of a left lower lobe subpleural 10 x 10 mm ground-glass pulmonary nodule . 5. Interval development of a right upper lobe spiculated 11 x 7 mm pulmonary nodule. Slightly more conspicuous right upper lobe 11 x 7 mm pulmonary nodule with surrounding ground-glass airspace opacity. 6. Slightly more conspicuous area scarring versus cluster of nodularity within the right upper lobe. 7. Small to moderate volume hiatal hernia. 8. Cholelithiasis no CT finding of acute cholecystitis. 9. Diverticulosis. 10. Uterine fibroids. 11. Aortic Atherosclerosis (ICD10-I70.0) including four-vessel coronary calcification. Electronically Signed: By: Tish Frederickson M.D. On: 06/25/2023 01:34   CT T-SPINE NO CHARGE  Result Date: 06/25/2023 CLINICAL DATA:  SOB and right leg weakness started around noon yesterday. EXAM: CT THORACIC AND LUMBAR SPINE WITHOUT CONTRAST TECHNIQUE: Multidetector CT imaging of the thoracic and lumbar spine was performed without contrast. Multiplanar CT image  reconstructions were also generated. RADIATION DOSE REDUCTION: This exam was performed according to the departmental dose-optimization program which includes automated exposure control, adjustment of the mA and/or kV according to patient size and/or use of iterative reconstruction technique. COMPARISON:  CT abdomen pelvis  06/04/2023, CT chest 08/29/2020 FINDINGS: CT THORACIC SPINE FINDINGS Alignment: Normal. Vertebrae: No acute fracture or focal pathologic process. Paraspinal and other soft tissues: Negative. Disc levels: Couple level intervertebral disc space vacuum phenomenon. Otherwise maintained. CT LUMBAR SPINE FINDINGS Segmentation: 5 lumbar type vertebrae. Alignment: Normal. Vertebrae: Multilevel mild to moderate facet arthropathy. No severe osseous neural foraminal or central canal stenosis. No acute fracture or focal pathologic process. Paraspinal and other soft tissues: Negative. Disc levels: Maintained. Other: Moderate severe osseous neural foraminal stenosis at the left C6-C7 level. IMPRESSION: CT THORACIC SPINE IMPRESSION No acute displaced fracture or traumatic listhesis of the thoracic spine. CT LUMBAR SPINE IMPRESSION No acute displaced fracture or traumatic listhesis of the lumbar spine. Electronically Signed   By: Tish Frederickson M.D.   On: 06/25/2023 01:07   CT L-SPINE NO CHARGE  Result Date: 06/25/2023 CLINICAL DATA:  SOB and right leg weakness started around noon yesterday. EXAM: CT THORACIC AND LUMBAR SPINE WITHOUT CONTRAST TECHNIQUE: Multidetector CT imaging of the thoracic and lumbar spine was performed without contrast. Multiplanar CT image reconstructions were also generated. RADIATION DOSE REDUCTION: This exam was performed according to the departmental dose-optimization program which includes automated exposure control, adjustment of the mA and/or kV according to patient size and/or use of iterative reconstruction technique. COMPARISON:  CT abdomen pelvis 06/04/2023, CT chest  08/29/2020 FINDINGS: CT THORACIC SPINE FINDINGS Alignment: Normal. Vertebrae: No acute fracture or focal pathologic process. Paraspinal and other soft tissues: Negative. Disc levels: Couple level intervertebral disc space vacuum phenomenon. Otherwise maintained. CT LUMBAR SPINE FINDINGS Segmentation: 5 lumbar type vertebrae. Alignment: Normal. Vertebrae: Multilevel mild to moderate facet arthropathy. No severe osseous neural foraminal or central canal stenosis. No acute fracture or focal pathologic process. Paraspinal and other soft tissues: Negative. Disc levels: Maintained. Other: Moderate severe osseous neural foraminal stenosis at the left C6-C7 level. IMPRESSION: CT THORACIC SPINE IMPRESSION No acute displaced fracture or traumatic listhesis of the thoracic spine. CT LUMBAR SPINE IMPRESSION No acute displaced fracture or traumatic listhesis of the lumbar spine. Electronically Signed   By: Tish Frederickson M.D.   On: 06/25/2023 01:07   CT HEAD WO CONTRAST ( )  Result Date: 06/25/2023 CLINICAL DATA:  Memory loss dementia, generalized weakness EXAM: CT HEAD WITHOUT CONTRAST TECHNIQUE: Contiguous axial images were obtained from the base of the skull through the vertex without intravenous contrast. RADIATION DOSE REDUCTION: This exam was performed according to the departmental dose-optimization program which includes automated exposure control, adjustment of the mA and/or kV according to patient size and/or use of iterative reconstruction technique. COMPARISON:  CT head 03/30/2021 FINDINGS: Brain: Stable cerebral ventricle sizes are concordant with the degree of cerebral volume loss. Patchy and confluent areas of decreased attenuation are noted throughout the deep and periventricular white matter of the cerebral hemispheres bilaterally, compatible with chronic microvascular ischemic disease. Right occipital lobe encephalomalacia. No evidence of large-territorial acute infarction. No parenchymal hemorrhage. No  mass lesion. No extra-axial collection. No mass effect or midline shift. No hydrocephalus. Basilar cisterns are patent. Vascular: No hyperdense vessel. Atherosclerotic calcifications are present within the cavernous internal carotid and vertebral arteries. Skull: No acute fracture or focal lesion. Sinuses/Orbits: Paranasal sinuses and mastoid air cells are clear. Left lens replacement. Otherwise the orbits are unremarkable. Other: None. IMPRESSION: No acute intracranial abnormality. Electronically Signed   By: Tish Frederickson M.D.   On: 06/25/2023 00:54   DG Chest 2 View  Result Date: 06/24/2023 CLINICAL DATA:  Shortness of breath and extremity weakness EXAM: CHEST -  2 VIEW COMPARISON:  Chest radiograph dated 06/03/2023 FINDINGS: Lines/tubes: Left chest wall pacemaker leads project over the right atrium and ventricle and tributary of the coronary sinus. Lungs: Patient is rotated slightly to the right. Unchanged elevation of the right hemidiaphragm. Slightly low lung volumes. No focal consolidations. Patchy left basilar opacities. Pleura: No pneumothorax or pleural effusion. Heart/mediastinum: The heart size and mediastinal contours are within normal limits. Bones: No acute osseous abnormality. IMPRESSION: Slightly low lung volumes with patchy left basilar opacities, which may represent atelectasis, aspiration, or pneumonia. Electronically Signed   By: Agustin Cree M.D.   On: 06/24/2023 16:33    Anti-infectives: Anti-infectives (From admission, onward)    Start     Dose/Rate Route Frequency Ordered Stop   06/25/23 1030  piperacillin-tazobactam (ZOSYN) IVPB 2.25 g        2.25 g 100 mL/hr over 30 Minutes Intravenous Every 8 hours 06/25/23 0945     06/25/23 0200  piperacillin-tazobactam (ZOSYN) IVPB 3.375 g        3.375 g 100 mL/hr over 30 Minutes Intravenous  Once 06/25/23 0158 06/25/23 0247       Assessment/Plan: 44F Perforated appendicitis   Perforated appendicitis  - con't broad spectrum abx,  recommend repeat CT in 3-5d as abdominal exam is unreliable. Expect development of abscess that would require IR drain, but currently is just a phlegmon. FEN - regular diet okay from surgery standpoint DVT - SCDs, LMWH Dispo -  per primary    LOS: 1 day    Axel Filler 06/26/2023

## 2023-06-26 NOTE — Consult Note (Cosign Needed Addendum)
Palliative Care Consult Note                                  Date: 06/26/2023   Patient Name: Betty [name]  DOB: 08/25/1946  MRN: [id number]  Age / Sex: 77 y.o., female  PCP: Collene Mares, [place] Referring Physician: Kathlen Mody, MD  Reason for Consultation: Establishing goals of care  HPI/Patient Profile: 77 y.o. female  with past medical history of HFrEF (EF 30 to 35% on echo January 2024), CKD stage IV, COPD, non-small cell lung cancer s/p right lobectomy and chemotherapy in 2016, dementia, CAD status post MI, pacemaker, HTN, and HLD. She presented to the ED on 06/24/2023 with weakness, shortness of breath, and altered mental status.  She was found to have perforated appendicitis, AKI on CKD 4, possible UTI, and new pulmonary nodules. Palliative Medicine has been consulted for goals of care.   Subjective:   I have reviewed medical records including EPIC notes, labs and imaging, and assessed the patient at bedside.  She is alert, and oriented to self only.  She is not able to tell me her birthdate or why she is in the hospital.  She has no acute complaints.  I met with her son [name] in the 6N conference room to discuss diagnosis, prognosis, GOC, EOL wishes, disposition, and options.  I introduced Palliative Medicine as specialized medical care for people living with serious illness. It focuses on providing relief from the symptoms and stress of a serious illness.   Created space and opportunity for son to express thoughts and feelings regarding current medical situation. Values and goals of care were attempted to be elicited.  Life Review: Patient is retired from the [employer].  She is not married.  [name] is her only child, and she raised him as a single mother.    Functional Status: Patient lives alone. [name] reports a steady and gradual decline his mother's functional status over the past few years, with a more  significant decline in the past few weeks.  Prior to her recent hospitalization 06/03/2023, patient was able to mobilize in her home and walk to the mailbox.  Over the past few weeks, she has been mostly laying on the couch.  She has also been incontinent of bowel and bladder. [name] reports needing to check on her 3 times daily as she is no longer able to manage her medications.   GOC Discussion: We discussed patient's current illness and what it means in the larger context of her ongoing co-morbidities.  We reviewed patient's multiple acute and chronic medical issues including perforated appendicitis, chronic heart failure, AKI on CKD stage 4, COPD, dementia, and lung cancer.   We reviewed that dementia is a progressive, non-curable disease underlying the patient's current acute medical conditions. We reviewed specific indicators that patient has likely reached advanced stages, including decreased mobility, decreased oral intake, and  incontinence of bowel/bladder.  I also informed [name] that imaging during this admission has revealed new pulmonary nodules, concerning for lung cancer recurrence.  We discussed different paths with regard to scope of treatment - full scope versus limited interventions (treat the treatable) versus comfort care.  We discussed concepts specific to code status, mechanical ventilation, IV antibiotics, IV fluids, artificial feeding, and rehospitalization.  [name] is clear that he would want comfort focused care for his mother, as this would be consistent with her previously expressed wishes.  He feels this is most appropriate in the setting of patient's advanced dementia and declining functional status.  He speaks to the importance of quality of life and dignity.    We discussed disposition and that PT evaluation is pending.  Son expresses much concern with regard to his mother being able to live at home in her current condition.  Patient cannot afford private duty care or  LTC in a facility, but also does not qualify for assistance. Son cannot stay with her as works full time (as band Interior and spatial designer at [employer]).  We discussed the philosophy and benefits of hospice care. Discussed that it offers a holistic approach to care in the setting of advanced illness, and is is line with the goal of comfort and dignity while allowing the natural course to occur.  [name] verbalizes understanding and will consider  We completed a MOST form today.  The following treatment preferences have been outlined:  Cardiopulmonary Resuscitation: Do Not Attempt Resuscitation (DNR/No CPR)  Medical Interventions: Comfort Measures: Keep clean, warm, and dry. Use medication by any route, positioning, wound care, and other measures to relieve pain and suffering. Use oxygen, suction and manual treatment of airway obstruction as needed for comfort. Do not transfer to the hospital unless comfort needs cannot be met in current location.  Antibiotics: Determine use of limitation of antibiotics when infection occurs  IV Fluids: IV fluids for a defined trial period  Feeding Tube: No feeding tube     Review of Systems  Unable to perform ROS   Objective:   Primary Diagnoses: Present on Admission:  Perforated appendicitis   Physical Exam Vitals reviewed.  Constitutional:      General: She is not in acute distress.    Comments: Chronically ill-appearing  Pulmonary:     Effort: Pulmonary effort is normal.  Neurological:     Mental Status: She is alert. She is confused.     Comments: Oriented to self only  Psychiatric:        Cognition and Memory: Cognition is impaired. Memory is impaired.     Vital Signs:  BP 136/66 (BP Location: Left Arm)   Pulse 61   Temp 98.3 F (36.8 C) (Oral)   Resp 18   Ht 5\' 4"  (1.626 m)   Wt 55.8 kg   SpO2 100%   BMI 21.12 kg/m   Palliative Assessment/Data: PPS 30-40%     Assessment & Plan:   SUMMARY OF RECOMMENDATIONS   Code status changed  to DNR  MOST form completed - copy will be scanned into the EMR Son is concerned about patient returning home - he has limited options for care PT evaluation pending PMT will continue to follow  Primary Decision Maker: NEXT OF KIN son Sharlonda Shellhouse  Prognosis:  Difficult to determine, but less than 6 months would not be surprising  Discharge Planning:  To Be Determined    Thank you for allowing [place] to participate in the care of DESIREA LOOSLI   Time Total: 90 minutes  Greater than 50%  of this time was spent counseling and coordinating care related to the above assessment and plan.   Signed by: Sherlean Foot, NP Palliative Medicine Team  Team Phone # [phone]  For individual providers, please see AMION

## 2023-06-26 NOTE — Plan of Care (Signed)

## 2023-06-27 DIAGNOSIS — N179 Acute kidney failure, unspecified: Secondary | ICD-10-CM

## 2023-06-27 DIAGNOSIS — Z7189 Other specified counseling: Secondary | ICD-10-CM

## 2023-06-27 DIAGNOSIS — Z85118 Personal history of other malignant neoplasm of bronchus and lung: Secondary | ICD-10-CM | POA: Diagnosis not present

## 2023-06-27 DIAGNOSIS — F0390 Unspecified dementia without behavioral disturbance: Secondary | ICD-10-CM

## 2023-06-27 DIAGNOSIS — K3532 Acute appendicitis with perforation and localized peritonitis, without abscess: Secondary | ICD-10-CM | POA: Diagnosis not present

## 2023-06-27 DIAGNOSIS — I10 Essential (primary) hypertension: Secondary | ICD-10-CM | POA: Diagnosis not present

## 2023-06-27 DIAGNOSIS — Z515 Encounter for palliative care: Secondary | ICD-10-CM

## 2023-06-27 MED ORDER — AMOXICILLIN-POT CLAVULANATE 875-125 MG PO TABS: 1.0000 | ORAL_TABLET | Freq: Two times a day (BID) | ORAL | 0 refills | Status: DC

## 2023-06-27 MED ORDER — AMOXICILLIN-POT CLAVULANATE 500-125 MG PO TABS
1.0000 | ORAL_TABLET | Freq: Every day | ORAL | Status: DC
  Administered 2023-06-27 – 2023-07-07 (×11): 1 via ORAL
  Filled 2023-06-27 (×12): qty 1

## 2023-06-27 MED ORDER — AMOXICILLIN-POT CLAVULANATE 500-125 MG PO TABS: 1.0000 | ORAL_TABLET | Freq: Every day | ORAL | 0 refills | Status: DC

## 2023-06-27 NOTE — Evaluation (Signed)
Physical Therapy Evaluation Patient Details Name: Betty [name] MRN: [id number] DOB: [birth year] Today's Date: 06/27/2023  History of Present Illness  Pt is 77 yo female who presents on 06/24/23 with shortness of breathe. Found to have perforated appendicitis and currently receiving non-operative management.  PMH: dementia, CKD4, CHF, COPD, NSC lung cancer s/p lobectomy, NSTEMI, HTN, HLD, GERD  Clinical Impression  Pt is presenting most likely close to baseline. Pt presented at this level of function and cognitive status during last hospitalization at the beginning of this month when recommendation from rehab was skilled nursing and pt was sent home alone. Per mobility specialist note from yesterday pt was found sitting in her feces and was unaware of her situation. Pt is at a very high risk for falls and re-hospitalization. Currently CGA for sit to stand and short distance gait with slow shuffling steps. Pt is Min A for ascending/descending stairs per home set up. Pt perseverating during session on son "[name]", [employer] and elevators asking multiple times if we are going to the elevators or if I have seen her son "[name]". Pt is at a very high risk for falls and due to current functional and cognitive status will require 24/7 supervision on discharge from acute care hospital setting with intermittent assist for functional mobility for safety to prevent falls especially at night. Due to pt current functional status recommending skilled physical therapy services with 24/7 supervision on discharge from acute care hospital setting in order to prevent injury, falls, re-hospitalization        If plan is discharge home, recommend the following: A little help with walking and/or transfers;Assistance with cooking/housework;Assist for transportation;Help with stairs or ramp for entrance   Can travel by private vehicle   Yes    Equipment Recommendations None recommended by PT  Recommendations for Other Services  OT  consult    Functional Status Assessment Patient has had a recent decline in their functional status and demonstrates the ability to make significant improvements in function in a reasonable and predictable amount of time.     Precautions / Restrictions Precautions Precautions: Fall Restrictions Weight Bearing Restrictions: No      Mobility  Bed Mobility     General bed mobility comments: pt sitting on sofa at beginning and end of session looking for son [name]    Transfers Overall transfer level: Needs assistance Equipment used: Rolling walker (2 wheels) Transfers: Sit to/from Stand Sit to Stand: Contact guard assist           General transfer comment: CGA for sit to stand for stability    Ambulation/Gait Ambulation/Gait assistance: Contact guard assist Gait Distance (Feet): 200 Feet Assistive device: Rolling walker (2 wheels) Gait Pattern/deviations: Step-through pattern, Step-to pattern, Narrow base of support Gait velocity: significant decrease in cadence. Gait velocity interpretation: <1.8 ft/sec, indicate of risk for recurrent falls   General Gait Details: Very low foot clearance, narrow BOS, partial step through to step to gait pattern. Almost a shuffling gait pattern. Pt unable to correct when cued.  Stairs Stairs: Yes Stairs assistance: Min assist Stair Management: One rail Right, Step to pattern Number of Stairs: 3 General stair comments: Min A for ascending descending stairs due to posterior lean      Balance Overall balance assessment: Needs assistance Sitting-balance support: Feet unsupported, No upper extremity supported Sitting balance-Leahy Scale: Good   Postural control: Posterior lean Standing balance support: Bilateral upper extremity supported Standing balance-Leahy Scale: Poor Standing balance comment: needs UE support to maintain  balance         Pertinent Vitals/Pain Pain Assessment Pain Assessment: No/denies pain    Home Living  Family/patient expects to be discharged to:: Private residence Living Arrangements: Alone Available Help at Discharge: Family;Available PRN/intermittently Type of Home: House Home Access: Stairs to enter Entrance Stairs-Rails:  (pt unable to report) Entrance Stairs-Number of Steps: 5   Home Layout: One level Home Equipment: Agricultural consultant (2 wheels);Cane - single point Additional Comments: Pt's son comes by daily but does not spend the night there. Family makes meals for her and she warms them up    Prior Function Prior Level of Function : Needs assist             Mobility Comments: Mod  I ADLs Comments: Independent with ADLs and family assists with IADL's     Extremity/Trunk Assessment   Upper Extremity Assessment Upper Extremity Assessment: Generalized weakness    Lower Extremity Assessment Lower Extremity Assessment: Generalized weakness    Cervical / Trunk Assessment Cervical / Trunk Assessment: Normal  Communication   Communication Communication: No apparent difficulties Cueing Techniques: Verbal cues;Tactile cues;Visual cues  Cognition Arousal: Alert Behavior During Therapy: WFL for tasks assessed/performed Overall Cognitive Status: History of cognitive impairments - at baseline         General Comments: Pt is oriented to self and birthday. Otherwise pt is not oriented.        General Comments General comments (skin integrity, edema, etc.): pt is perseverating on the elevator and her son "ken's car"        Assessment/Plan    PT Assessment Patient needs continued PT services  PT Problem List Decreased strength;Decreased activity tolerance;Decreased balance;Decreased mobility       PT Treatment Interventions DME instruction;Gait training;Stair training;Functional mobility training;Therapeutic activities;Therapeutic exercise;Balance training;Neuromuscular re-education;Patient/family education;Cognitive remediation    PT Goals (Current goals can be  found in the Care Plan section)  Acute Rehab PT Goals Patient Stated Goal: to go home with son [name] PT Goal Formulation: With patient Time For Goal Achievement: 07/11/23 Potential to Achieve Goals: Good    Frequency Min 1X/week        AM-PAC PT "6 Clicks" Mobility  Outcome Measure Help needed turning from your back to your side while in a flat bed without using bedrails?: A Little Help needed moving from lying on your back to sitting on the side of a flat bed without using bedrails?: A Little Help needed moving to and from a bed to a chair (including a wheelchair)?: A Little Help needed standing up from a chair using your arms (e.g., wheelchair or bedside chair)?: A Little Help needed to walk in hospital room?: A Little Help needed climbing 3-5 steps with a railing? : A Little 6 Click Score: 18    End of Session Equipment Utilized During Treatment: Gait belt Activity Tolerance: Patient tolerated treatment well Patient left: Other (comment) (sitting on sofa in room near window. Pt adament to watch for son [name] stating he is out in the parking lot. Son is not at the hospital. Nursing aware pt is sitting on sofa.) Nurse Communication: Mobility status PT Visit Diagnosis: Unsteadiness on feet (R26.81);Difficulty in walking, not elsewhere classified (R26.2);Pain    Time: 4132-4401 PT Time Calculation (min) (ACUTE ONLY): 24 min   Charges:   PT Evaluation $PT Eval Low Complexity: 1 Low PT Treatments $Therapeutic Activity: 8-22 mins PT General Charges $$ ACUTE PT VISIT: 1 Visit        Harrel Carina,  DPT, CLT  Acute Rehabilitation Services Office: [phone] (Secure chat preferred)   Claudia Desanctis 06/27/2023, 4:05 PM

## 2023-06-27 NOTE — Progress Notes (Signed)
Subjective: CC: Seen with RN.  Patient denies any abdominal pain. Reported to be tolerating regular diet without vomiting and had a bm yesterday.   Afebrile without tachycardia. Soft BP this am (92/66) that has resolved. Last BP 148/63. She is on medications for her BP including a BB.   Objective: Vital signs in last 24 hours: Temp:  [97.7 F (36.5 C)-98.4 F (36.9 C)] 97.7 F (36.5 C) (08/22 0546) Pulse Rate:  [61-71] 64 (08/22 0546) Resp:  [17-18] 18 (08/22 0546) BP: (92-148)/(56-66) 148/63 (08/22 0546) SpO2:  [97 %-100 %] 100 % (08/22 0546) Last BM Date : 06/26/23  Intake/Output from previous day: 08/21 0701 - 08/22 0700 In: 843 [P.O.:840; I.V.:3] Out: 3 [Urine:2; Stool:1] Intake/Output this shift: No intake/output data recorded.  PE: Gen:  Alert, NAD, pleasant Abd: Soft, ND, NT  Lab Results:  Recent Labs    06/24/23 1425 06/26/23 1605  WBC 12.2* 8.2  HGB 10.7* 10.2*  HCT 32.3* 30.9*  PLT 364 333   BMET Recent Labs    06/24/23 1425 06/26/23 1605  NA 143 140  K 4.0 4.4  CL 108 107  CO2 22 22  GLUCOSE 110* 114*  BUN 26* 38*  CREATININE 3.14* 3.84*  CALCIUM 8.9 8.4*   PT/INR No results for input(s): "LABPROT", "INR" in the last 72 hours. CMP     Component Value Date/Time   NA 140 06/26/2023 1605   NA 143 06/02/2018 1505   NA 145 09/18/2016 1057   K 4.4 06/26/2023 1605   K 4.9 09/18/2016 1057   CL 107 06/26/2023 1605   CO2 22 06/26/2023 1605   CO2 22 09/18/2016 1057   GLUCOSE 114 (H) 06/26/2023 1605   GLUCOSE 93 09/18/2016 1057   BUN 38 (H) 06/26/2023 1605   BUN 28 (H) 06/02/2018 1505   BUN 39.6 (H) 09/18/2016 1057   CREATININE 3.84 (H) 06/26/2023 1605   CREATININE 2.66 (H) 08/12/2020 0948   CREATININE 2.8 (H) 09/18/2016 1057   CALCIUM 8.4 (L) 06/26/2023 1605   CALCIUM 9.8 09/18/2016 1057   PROT 6.6 06/26/2023 1605   PROT 7.6 09/18/2016 1057   ALBUMIN 2.9 (L) 06/26/2023 1605   ALBUMIN 3.6 09/18/2016 1057   AST 19 06/26/2023  1605   AST 21 08/12/2020 0948   AST 19 09/18/2016 1057   ALT 13 06/26/2023 1605   ALT 23 08/12/2020 0948   ALT 12 09/18/2016 1057   ALKPHOS 73 06/26/2023 1605   ALKPHOS 123 09/18/2016 1057   BILITOT 0.5 06/26/2023 1605   BILITOT 0.6 08/12/2020 0948   BILITOT 0.35 09/18/2016 1057   GFRNONAA 11 (L) 06/26/2023 1605   GFRNONAA 17 (L) 08/12/2020 0948   GFRAA 23 (L) 08/11/2019 1108   Lipase     Component Value Date/Time   LIPASE 44 06/24/2019 1348    Studies/Results: No results found.  Anti-infectives: Anti-infectives (From admission, onward)    Start     Dose/Rate Route Frequency Ordered Stop   06/25/23 1030  piperacillin-tazobactam (ZOSYN) IVPB 2.25 g        2.25 g 100 mL/hr over 30 Minutes Intravenous Every 8 hours 06/25/23 0945     06/25/23 0200  piperacillin-tazobactam (ZOSYN) IVPB 3.375 g        3.375 g 100 mL/hr over 30 Minutes Intravenous  Once 06/25/23 0158 06/25/23 0247        Assessment/Plan 71F Perforated appendicitis - Afebrile. Soft BP resolved. No tachycardia. WBC wnl on last check. No  peritonitis on exam. Tolerating regular diet and having bowel function. No indication for emergency surgery - Recommend repeat CT 3-5d after initial CT scan. Expect development of abscess that would require IR drain, but on initial scan appears to be phlegmon.  - Cont abx - Given overall clinical picture, if patient improves with consverative management would recommend GI f/u for colonoscopy in 4-6 weeks.  - To note, pelvis US w/ hypoechoic mass with internal vascularity in the right adnexa with indeterminate imaging features - MRI was recommended by radiology but appears she cannot get this 2/2 pacemaker.   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 Arbutus Ped  Multiple pulmonary nodules including  RUL spiculated pulm nodule - agree with [facility] recs for Oncology f/u  I reviewed nursing notes, hospitalist notes, last 24 h vitals and pain scores, last 48 h intake and output, last 24 h labs and trends, and last 24 h imaging results.   LOS: 2 days    Jacinto Halim , [facility] Surgery 06/27/2023, 7:54 AM Please see Amion for pager number during day hours 7:00am-4:30pm

## 2023-06-27 NOTE — Progress Notes (Signed)
       Thank you for this consult. Chart reviewed. Patient assessed at bedside. She is oriented to self only and not able to participate in GOC discussion. I spoke with son/[name] by phone; he confirms that he is patient's next of kin and medical decision maker. He is unavailable today, but  we have planned to meet tomorrow - time TBD.     Sherlean Foot, NP-C Palliative Medicine   Please call Palliative Medicine team phone with any questions [phone]. For individual providers please see AMION.   No charge

## 2023-06-27 NOTE — Progress Notes (Signed)
Mobility Specialist: Progress Note   06/27/23 1640  Mobility  Activity Ambulated with assistance in hallway  Level of Assistance Standby assist, set-up cues, supervision of patient - no hands on  Assistive Device Other (Comment) (occasional use of handrails)  Distance Ambulated (ft) 120 ft  Activity Response Tolerated well  Mobility Referral Yes  $Mobility charge 1 Mobility  Mobility Specialist Start Time (ACUTE ONLY) 1605  Mobility Specialist Stop Time (ACUTE ONLY) 1630  Mobility Specialist Time Calculation (min) (ACUTE ONLY) 25 min    Pt was agreeable to mobility session - received on couch. Much more confused than she was during yesterday's session. Was adamant about not needing the RW, opted to use HHA with hallway handrails. Denies any pain, weakness, fatigue, or dizziness. Took 1x seated break (~3 min). Returned to room without fault. Stated she needed to use the bathroom; upon standing and ambulation pt became incontinent and peed on herself and floor. Also found out after pt sat on toilet that she had a BM in briefs. MS assisted with brief change and room clean. Pt returned to recliner. Left in recliner with all needs met, call bell in reach.    Maurene Capes Mobility Specialist Please contact via SecureChat or Rehab office at [phone]

## 2023-06-27 NOTE — Progress Notes (Signed)
[facility]                                                                               Betty [name], is a 77 y.o. female, DOB - Mar 21, 1946, WUJ:[id number] Admit date - 06/24/2023    Outpatient Primary MD for the patient is [place], [place], [place]  LOS - 2  days    Brief summary   Betty [name] is a 77 y.o. female with a known history of acute combined systolic and diastolic heart failure, CKD stage IV, COPD, dementia, GERD, hypertension, hyperlipidemia, non-small cell carcinoma of the lung, CAD status post MI, pacemaker, presents to the emergency department for evaluation of shortness of breath.  Patient son found her today with unsteady gait, shortness of breath, dyspnea on exertion and confused . She was found to have perforated appendicitis.    Assessment & Plan    Assessment and Plan:    Perforated appendicitis - admitted for IV antibiotics and IV fluids. Transitioned to oral augmentin.  - further management as per general surgery.  - Pain control - initial  plan was to repeat CT in 3 to 5 days, cannot have MRI due to pacemaker.  But she is asymptomatic, no nausea or vomiting or abd pain.  She is tolerating diet and   C diff pCR is negative.  Abd exam is unreliable.    #. Acute kidney injury on CKD 4 Hydrate and repeat renal parameters in am.     Possible urinary tract infection - Transitioned  to augmentin.     New pulmonary nodules in the setting of non-small cell lung cancer -Will need follow-up with oncology for further workup if desired by family   History of acute combined systolic and diastolic heart failure - Continue metoprolol, atorvastatin, isosorbide dinitrate   History of CAD status post MI - Continue atorvastatin, nitroglycerin, aspirin    History of COPD  No wheezing heard.  - Continue O2 and nebs as needed, Claritin    History of GERD Stable.     History of dementia -Continue donepezil and memantine. She will need 24 hour  supervision on discharge as per PT/         Estimated body mass index is 21.12 kg/m as calculated from the following:   Height as of this encounter: 5\' 4"  (1.626 m).   Weight as of this encounter: 55.8 kg.  Code Status: full code.  DVT Prophylaxis:  enoxaparin (LOVENOX) injection 30 mg Start: 06/25/23 2200 SCDs Start: 06/25/23 1124   Level of Care: Level of care: Telemetry Medical Family Communication: none at bedside.   Consultants:   Surgery.   Antimicrobials:   Anti-infectives (From admission, onward)    Start     Dose/Rate Route Frequency Ordered Stop   06/27/23 2200  amoxicillin-clavulanate (AUGMENTIN) 500-125 MG per tablet 1 tablet        1 tablet Oral Daily at bedtime 06/27/23 1455     06/27/23 0000  amoxicillin-clavulanate (AUGMENTIN) 875-125 MG tablet  Status:  Discontinued        1 tablet Oral 2 times daily 06/27/23 1454 06/27/23    06/27/23 0000  amoxicillin-clavulanate (AUGMENTIN) 500-125 MG tablet  1 tablet Oral Daily at bedtime 06/27/23 1528 07/10/23 2359   06/25/23 1030  piperacillin-tazobactam (ZOSYN) IVPB 2.25 g  Status:  Discontinued        2.25 g 100 mL/hr over 30 Minutes Intravenous Every 8 hours 06/25/23 0945 06/27/23 1455   06/25/23 0200  piperacillin-tazobactam (ZOSYN) IVPB 3.375 g        3.375 g 100 mL/hr over 30 Minutes Intravenous  Once 06/25/23 0158 06/25/23 0247        Medications  Scheduled Meds:  amoxicillin-clavulanate  1 tablet Oral QHS   aspirin EC  81 mg Oral QHS   atorvastatin  40 mg Oral Daily   cholecalciferol  1,000 Units Oral QHS   donepezil  10 mg Oral QHS   enoxaparin (LOVENOX) injection  30 mg Subcutaneous Q24H   febuxostat  40 mg Oral QHS   hydrALAZINE  50 mg Oral Q8H   isosorbide dinitrate  20 mg Oral TID   loratadine  10 mg Oral Daily   memantine  10 mg Oral BID   metoprolol succinate  50 mg Oral Daily   multivitamin with minerals  0.5 tablet Oral QHS   pantoprazole  40 mg Oral q morning   QUEtiapine  25  mg Oral QHS   sodium bicarbonate  650 mg Oral BID   sodium chloride flush  3 mL Intravenous Q12H   Continuous Infusions:  lactated ringers Stopped (06/25/23 1454)   PRN Meds:.acetaminophen **OR** acetaminophen, bisacodyl, hydrALAZINE, HYDROcodone-acetaminophen, ipratropium-albuterol, morphine injection, nitroGLYCERIN, ondansetron **OR** ondansetron (ZOFRAN) IV, senna-docusate, traZODone    Subjective:   Betty [name] was seen and examined today.  No pain, no nausea or vomiting.    Objective:   Vitals:   06/26/23 1618 06/26/23 2103 06/27/23 0546 06/27/23 1634  BP: (!) 113/56 92/66 (!) 148/63 116/61  Pulse: 69 71 64 65  Resp: 17 18 18 17   Temp: 98.1 F (36.7 C) 98.4 F (36.9 C) 97.7 F (36.5 C) 97.8 F (36.6 C)  TempSrc: Oral Oral Oral Oral  SpO2: 100% 97% 100% 99%  Weight:      Height:        Intake/Output Summary (Last 24 hours) at 06/27/2023 1722 Last data filed at 06/27/2023 8295 Gross per 24 hour  Intake 133 ml  Output --  Net 133 ml   Filed Weights   06/24/23 1413  Weight: 55.8 kg     Exam General exam: Appears calm and comfortable  Respiratory system: Clear to auscultation. Respiratory effort normal. Cardiovascular system: S1 & S2 heard, RRR.  Gastrointestinal system: Abdomen is nondistended, soft and nontender.  Central nervous system: Alert and oriented to person and place only.  Extremities: Symmetric 5 x 5 power. Skin: No rashes,  Psychiatry: mood is appropriate.     Data Reviewed:  I have personally reviewed following labs and imaging studies   CBC Lab Results  Component Value Date   WBC 8.2 06/26/2023   RBC 3.33 (L) 06/26/2023   HGB 10.2 (L) 06/26/2023   HCT 30.9 (L) 06/26/2023   MCV 92.8 06/26/2023   MCH 30.6 06/26/2023   PLT 333 06/26/2023   MCHC 33.0 06/26/2023   RDW 12.8 06/26/2023   LYMPHSABS 1.2 06/04/2023   MONOABS 0.7 06/04/2023   EOSABS 0.0 06/04/2023   BASOSABS 0.0 06/04/2023     Last metabolic panel Lab Results   Component Value Date   NA 140 06/26/2023   K 4.4 06/26/2023   CL 107 06/26/2023   CO2 22 06/26/2023  BUN 38 (H) 06/26/2023   CREATININE 3.84 (H) 06/26/2023   GLUCOSE 114 (H) 06/26/2023   GFRNONAA 11 (L) 06/26/2023   GFRAA 23 (L) 08/11/2019   CALCIUM 8.4 (L) 06/26/2023   PHOS 3.0 07/02/2019   PROT 6.6 06/26/2023   ALBUMIN 2.9 (L) 06/26/2023   BILITOT 0.5 06/26/2023   ALKPHOS 73 06/26/2023   AST 19 06/26/2023   ALT 13 06/26/2023   ANIONGAP 11 06/26/2023    CBG (last 3)  No results for input(s): "GLUCAP" in the last 72 hours.    Coagulation Profile: No results for input(s): "INR", "PROTIME" in the last 168 hours.   Radiology Studies: No results found.     Kathlen Mody M.D. [facility] 06/27/2023, 5:22 PM  Available via Epic secure chat 7am-7pm After 7 pm, please refer to night coverage provider listed on amion.

## 2023-06-27 NOTE — TOC Transition Note (Addendum)
Transition of Care [facility]) - CM/SW Discharge Note   Patient Details  Name: Betty [name] MRN: [id number] Date of Birth: 27-Dec-1945  [facility]) CM/SW Contact:  Lockie Pares, RN Phone Number: 06/27/2023, 1:37 PM   Clinical Narrative:    Patient may go home later today. Messaged with HH, [facility], and asked for new face to face from MD. She will need to F/U with surgery for another scan within the week. Has DME at home Son [name] will transport home when DC 1600 Discussion with team regarding post discharge Son [name] requested a call. Dr Blake Divine spoke with him, this RNCM left a message to discuss DC planning. PT had just evaluated her and is recommending 24/7 supervision.  Most likely the patient will not qualify for SNF, she may need LTC/ memory care. [name] is concerned about her coming home tonight.     Barriers to Discharge: Continued Medical Work up   Patient Goals and CMS Choice      Discharge Placement                         Discharge Plan and Services Additional resources added to the After Visit Summary for     Discharge Planning Services: CM Consult            DME Arranged:  (await PT eval)         HH Arranged: PT, OT HH Agency: [facility] Date [place] Agency Contacted: 06/27/23 Time HH Agency Contacted: 1336 Representative spoke with at [facility] Agency: Kandee Keen  Social Determinants of Health (SDOH) Interventions SDOH Screenings   Food Insecurity: No Food Insecurity (06/25/2023)  Housing: Low Risk  (06/25/2023)  Transportation Needs: No Transportation Needs (06/25/2023)  Utilities: Not At Risk (06/25/2023)  Alcohol Screen: Low Risk  (11/28/2021)  Depression (PHQ2-9): Low Risk  (11/28/2021)  Financial Resource Strain: Low Risk  (11/28/2021)  Physical Activity: Inactive (11/28/2021)  Social Connections: Unknown (11/28/2021)  Stress: No Stress Concern Present (11/28/2021)  Tobacco Use: Medium Risk (06/24/2023)     Readmission Risk  Interventions     No data to display

## 2023-06-28 ENCOUNTER — Encounter (HOSPITAL_COMMUNITY): Payer: Self-pay | Admitting: Family Medicine

## 2023-06-28 ENCOUNTER — Other Ambulatory Visit: Payer: Self-pay

## 2023-06-28 DIAGNOSIS — R918 Other nonspecific abnormal finding of lung field: Secondary | ICD-10-CM | POA: Diagnosis not present

## 2023-06-28 DIAGNOSIS — K3532 Acute appendicitis with perforation and localized peritonitis, without abscess: Secondary | ICD-10-CM | POA: Diagnosis not present

## 2023-06-28 DIAGNOSIS — I10 Essential (primary) hypertension: Secondary | ICD-10-CM | POA: Diagnosis not present

## 2023-06-28 DIAGNOSIS — F0390 Unspecified dementia without behavioral disturbance: Secondary | ICD-10-CM | POA: Diagnosis not present

## 2023-06-28 DIAGNOSIS — Z515 Encounter for palliative care: Secondary | ICD-10-CM | POA: Diagnosis not present

## 2023-06-28 MED ORDER — ORAL CARE MOUTH RINSE: 15.0000 mL | OROMUCOSAL | Status: DC | PRN

## 2023-06-28 MED ORDER — HYDRALAZINE HCL 50 MG PO TABS: 50.0000 mg | ORAL_TABLET | Freq: Three times a day (TID) | ORAL | 3 refills | Status: DC

## 2023-06-28 NOTE — Progress Notes (Addendum)
Subjective: CC: Patient denies any abdominal pain. Reports she is tolerating a diet without n/v and had a bm yesterday.   Afebrile without tachycardia or hypotension. Last WBC wnl.  Objective: Vital signs in last 24 hours: Temp:  [97.8 F (36.6 C)-98.2 F (36.8 C)] 98.2 F (36.8 C) (08/23 0758) Pulse Rate:  [63-72] 66 (08/23 0758) Resp:  [16-17] 16 (08/23 0758) BP: (116-152)/(46-72) 145/62 (08/23 0758) SpO2:  [99 %-100 %] 100 % (08/23 0758) Last BM Date : 06/26/23  Intake/Output from previous day: 08/22 0701 - 08/23 0700 In: 10 [I.V.:10] Out: -  Intake/Output this shift: No intake/output data recorded.  PE: Gen:  Alert, NAD, pleasant Abd: Soft, ND, NT  Lab Results:  Recent Labs    06/26/23 1605  WBC 8.2  HGB 10.2*  HCT 30.9*  PLT 333   BMET Recent Labs    06/26/23 1605  NA 140  K 4.4  CL 107  CO2 22  GLUCOSE 114*  BUN 38*  CREATININE 3.84*  CALCIUM 8.4*   PT/INR No results for input(s): "LABPROT", "INR" in the last 72 hours. CMP     Component Value Date/Time   NA 140 06/26/2023 1605   NA 143 06/02/2018 1505   NA 145 09/18/2016 1057   K 4.4 06/26/2023 1605   K 4.9 09/18/2016 1057   CL 107 06/26/2023 1605   CO2 22 06/26/2023 1605   CO2 22 09/18/2016 1057   GLUCOSE 114 (H) 06/26/2023 1605   GLUCOSE 93 09/18/2016 1057   BUN 38 (H) 06/26/2023 1605   BUN 28 (H) 06/02/2018 1505   BUN 39.6 (H) 09/18/2016 1057   CREATININE 3.84 (H) 06/26/2023 1605   CREATININE 2.66 (H) 08/12/2020 0948   CREATININE 2.8 (H) 09/18/2016 1057   CALCIUM 8.4 (L) 06/26/2023 1605   CALCIUM 9.8 09/18/2016 1057   PROT 6.6 06/26/2023 1605   PROT 7.6 09/18/2016 1057   ALBUMIN 2.9 (L) 06/26/2023 1605   ALBUMIN 3.6 09/18/2016 1057   AST 19 06/26/2023 1605   AST 21 08/12/2020 0948   AST 19 09/18/2016 1057   ALT 13 06/26/2023 1605   ALT 23 08/12/2020 0948   ALT 12 09/18/2016 1057   ALKPHOS 73 06/26/2023 1605   ALKPHOS 123 09/18/2016 1057   BILITOT 0.5 06/26/2023  1605   BILITOT 0.6 08/12/2020 0948   BILITOT 0.35 09/18/2016 1057   GFRNONAA 11 (L) 06/26/2023 1605   GFRNONAA 17 (L) 08/12/2020 0948   GFRAA 23 (L) 08/11/2019 1108   Lipase     Component Value Date/Time   LIPASE 44 06/24/2019 1348    Studies/Results: No results found.  Anti-infectives: Anti-infectives (From admission, onward)    Start     Dose/Rate Route Frequency Ordered Stop   06/27/23 2200  amoxicillin-clavulanate (AUGMENTIN) 500-125 MG per tablet 1 tablet        1 tablet Oral Daily at bedtime 06/27/23 1455     06/27/23 0000  amoxicillin-clavulanate (AUGMENTIN) 875-125 MG tablet  Status:  Discontinued        1 tablet Oral 2 times daily 06/27/23 1454 06/27/23    06/27/23 0000  amoxicillin-clavulanate (AUGMENTIN) 500-125 MG tablet        1 tablet Oral Daily at bedtime 06/27/23 1528 07/10/23 2359   06/25/23 1030  piperacillin-tazobactam (ZOSYN) IVPB 2.25 g  Status:  Discontinued        2.25 g 100 mL/hr over 30 Minutes Intravenous Every 8 hours 06/25/23 0945 06/27/23 1455  06/25/23 0200  piperacillin-tazobactam (ZOSYN) IVPB 3.375 g        3.375 g 100 mL/hr over 30 Minutes Intravenous  Once 06/25/23 0158 06/25/23 0247        Assessment/Plan 13F Perforated appendicitis - Afebrile without tachycardia or hypotension. WBC wnl on last check. No peritonitis on exam. Tolerating regular diet and having bowel function. No indication for emergency surgery - Okay for discharge from our standpoint. Would recommend 14d total of abx. We have arranged f/u in our office.  - Given overall clinical picture, if patient improves with consverative management would recommend GI f/u for colonoscopy in 4-6 weeks.  - To note, pelvis US w/ hypoechoic mass with internal vascularity in the right adnexa with indeterminate imaging features - MRI was recommended by radiology but appears she cannot get this 2/2 pacemaker. CA 125, CEA and AFP wnl.  - Will let [facility] know patient is okay for discharge from our  standpoint.  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 Arbutus Ped  Multiple pulmonary nodules including RUL spiculated pulm nodule - agree with [facility] recs for Oncology f/u  I reviewed nursing notes, hospitalist notes, last 24 h vitals and pain scores, last 48 h intake and output, last 24 h labs and trends, and last 24 h imaging results.   LOS: 3 days    Jacinto Halim , [facility] 06/28/2023, 9:34 AM Please see Amion for pager number during day hours 7:00am-4:30pm

## 2023-06-28 NOTE — Plan of Care (Signed)

## 2023-06-28 NOTE — TOC Progression Note (Addendum)
Transition of Care [facility]) - Progression Note    Patient Details  Name: Betty [name] MRN: [id number] Date of Birth: [birth year]  Transition of Care [facility] CM/SW Contact  Lockie Pares, RN Phone Number: 06/28/2023, 12:21 PM  Clinical Narrative:     Spoke with patient son via phone. He is upset about discharge plan. He cannot be with her 24/7, he has been in the process of  trying to hire aides and have several companies he is calling "but it is not happening soon enough for discharge".   She is still incontinent, and "weaker this morning". He feels that she should be placed in SNF .Messaged team  (RN, MD, CSW) regarding update to discharge plan. May need PT re-evaluation today to see how she manages, she is a high fall risk if she were to be by herself.  TOC will continue to follow 1500 called son gave him the acceptances for SNF so far to look over, and added that he can go on Medicare.gov for star ratings. He would prefer text updates. Texted the list to him. 1600 Mr Klepper stated that he would prefer [facility] as she has been there before, but if not then [facility] would be next choice. Message sent to CSW. Expected Discharge Plan:  (await PT eval) Barriers to Discharge: Continued Medical Work up  Expected Discharge Plan and Services   Discharge Planning Services: CM Consult   Living arrangements for the past 2 months: Single Family Home Expected Discharge Date: 06/27/23               DME Arranged:  (await PT eval)         HH Arranged: PT, OT HH Agency: [facility] Date [facility] Agency Contacted: 06/27/23 Time HH Agency Contacted: [phone] Representative spoke with at [facility] Agency: Kandee Keen   Social Determinants of Health (SDOH) Interventions SDOH Screenings   Food Insecurity: No Food Insecurity (06/25/2023)  Housing: Low Risk  (06/25/2023)  Transportation Needs: No Transportation Needs (06/25/2023)  Utilities: Not At Risk (06/25/2023)  Alcohol Screen: Low Risk  (11/28/2021)   Depression (PHQ2-9): Low Risk  (11/28/2021)  Financial Resource Strain: Low Risk  (11/28/2021)  Physical Activity: Inactive (11/28/2021)  Social Connections: Unknown (11/28/2021)  Stress: No Stress Concern Present (11/28/2021)  Tobacco Use: Medium Risk (06/28/2023)    Readmission Risk Interventions     No data to display

## 2023-06-28 NOTE — Progress Notes (Signed)
[facility]                                                                               Betty [name], is a 77 y.o. female, DOB - February 26, 1946, [id number] Admit date - 06/24/2023    Outpatient Primary MD for the patient is [place], [place], [place]  LOS - 3  days    Brief summary   Betty [name] is a 77 y.o. female with a known history of acute combined systolic and diastolic heart failure, CKD stage IV, COPD, dementia, GERD, hypertension, hyperlipidemia, non-small cell carcinoma of the lung, CAD status post MI, pacemaker, presents to the emergency department for evaluation of shortness of breath.  Patient son found her today with unsteady gait, shortness of breath, dyspnea on exertion and confused . She was found to have perforated appendicitis.    Assessment & Plan    Assessment and Plan:    Perforated appendicitis - admitted for IV antibiotics and IV fluids. Transitioned to oral augmentin to complete the course.  - further management as per general surgery.  - Pain control - initial  plan was to repeat CT in 3 to 5 days, cannot have MRI due to pacemaker.  But she is asymptomatic, no nausea or vomiting or abd pain.  She is tolerating diet .   C diff pCR is negative.     #. Acute kidney injury on CKD 4 Hydrate and repeat renal parameters  show creatinine of 3.84.     Possible urinary tract infection - Transitioned  to augmentin.     New pulmonary nodules in the setting of non-small cell lung cancer -Will need follow-up with oncology for further workup if desired by family   History of acute combined systolic and diastolic heart failure - Continue metoprolol, atorvastatin, isosorbide dinitrate   History of CAD status post MI - Continue atorvastatin, nitroglycerin, aspirin    History of COPD  No wheezing heard.  - Continue O2 and nebs as needed, Claritin    History of GERD Stable.     History of dementia -Continue donepezil and memantine. She will need  24 hour supervision on discharge as per PT.  Toc on board.  - in view of declining cognition and generalized weakness, poor progression. Palliative care consulted and recommendations given.  She will benefit from outpatient palliative services on discharge.           Estimated body mass index is 21.12 kg/m as calculated from the following:   Height as of this encounter: 5\' 4"  (1.626 m).   Weight as of this encounter: 55.8 kg.  Code Status: full code.  DVT Prophylaxis:  enoxaparin (LOVENOX) injection 30 mg Start: 06/25/23 2200 SCDs Start: 06/25/23 1124   Level of Care: Level of care: Telemetry Medical Family Communication: none at bedside. Discussed with son over the phone.   Consultants:   Surgery.  Palliative care services.   Antimicrobials:   Anti-infectives (From admission, onward)    Start     Dose/Rate Route Frequency Ordered Stop   06/27/23 2200  amoxicillin-clavulanate (AUGMENTIN) 500-125 MG per tablet 1 tablet        1 tablet Oral Daily  at bedtime 06/27/23 1455     06/27/23 0000  amoxicillin-clavulanate (AUGMENTIN) 875-125 MG tablet  Status:  Discontinued        1 tablet Oral 2 times daily 06/27/23 1454 06/27/23    06/27/23 0000  amoxicillin-clavulanate (AUGMENTIN) 500-125 MG tablet        1 tablet Oral Daily at bedtime 06/27/23 1528 07/10/23 2359   06/25/23 1030  piperacillin-tazobactam (ZOSYN) IVPB 2.25 g  Status:  Discontinued        2.25 g 100 mL/hr over 30 Minutes Intravenous Every 8 hours 06/25/23 0945 06/27/23 1455   06/25/23 0200  piperacillin-tazobactam (ZOSYN) IVPB 3.375 g        3.375 g 100 mL/hr over 30 Minutes Intravenous  Once 06/25/23 0158 06/25/23 0247        Medications  Scheduled Meds:  amoxicillin-clavulanate  1 tablet Oral QHS   aspirin EC  81 mg Oral QHS   atorvastatin  40 mg Oral Daily   cholecalciferol  1,000 Units Oral QHS   donepezil  10 mg Oral QHS   enoxaparin (LOVENOX) injection  30 mg Subcutaneous Q24H   febuxostat  40 mg  Oral QHS   hydrALAZINE  50 mg Oral Q8H   isosorbide dinitrate  20 mg Oral TID   loratadine  10 mg Oral Daily   memantine  10 mg Oral BID   metoprolol succinate  50 mg Oral Daily   multivitamin with minerals  0.5 tablet Oral QHS   pantoprazole  40 mg Oral q morning   QUEtiapine  25 mg Oral QHS   sodium bicarbonate  650 mg Oral BID   sodium chloride flush  3 mL Intravenous Q12H   Continuous Infusions:  lactated ringers Stopped (06/25/23 1454)   PRN Meds:.acetaminophen **OR** acetaminophen, bisacodyl, hydrALAZINE, HYDROcodone-acetaminophen, ipratropium-albuterol, morphine injection, nitroGLYCERIN, ondansetron **OR** ondansetron (ZOFRAN) IV, mouth rinse, senna-docusate, traZODone    Subjective:   Betty [name] was seen and examined today.   She had BM in the bed , in continent.  No nausea, vomiting or abd pain.    Objective:   Vitals:   06/28/23 0343 06/28/23 0758 06/28/23 1417 06/28/23 1601  BP: (!) 152/72 (!) 145/62 121/68 103/62  Pulse: 63 66  62  Resp: 17 16  16   Temp: 98 F (36.7 C) 98.2 F (36.8 C)  98.7 F (37.1 C)  TempSrc: Oral Oral  Oral  SpO2: 100% 100%  94%  Weight:      Height:       No intake or output data in the 24 hours ending 06/28/23 1759  Filed Weights   06/24/23 1413  Weight: 55.8 kg     Exam General exam: Appears calm and comfortable  Respiratory system: Clear to auscultation. Respiratory effort normal. Cardiovascular system: S1 & S2 heard, RRR.  Gastrointestinal system: Abdomen is nondistended, soft and nontender. Central nervous system: Alert and oriented to person only.  Extremities: Symmetric 5 x 5 power. Skin: No rashes,  Psychiatry: Mood & affect appropriate.      Data Reviewed:  I have personally reviewed following labs and imaging studies   CBC Lab Results  Component Value Date   WBC 8.2 06/26/2023   RBC 3.33 (L) 06/26/2023   HGB 10.2 (L) 06/26/2023   HCT 30.9 (L) 06/26/2023   MCV 92.8 06/26/2023   MCH 30.6 06/26/2023    PLT 333 06/26/2023   MCHC 33.0 06/26/2023   RDW 12.8 06/26/2023   LYMPHSABS 1.2 06/04/2023   MONOABS 0.7 06/04/2023  EOSABS 0.0 06/04/2023   BASOSABS 0.0 06/04/2023     Last metabolic panel Lab Results  Component Value Date   NA 140 06/26/2023   K 4.4 06/26/2023   CL 107 06/26/2023   CO2 22 06/26/2023   BUN 38 (H) 06/26/2023   CREATININE 3.84 (H) 06/26/2023   GLUCOSE 114 (H) 06/26/2023   GFRNONAA 11 (L) 06/26/2023   GFRAA 23 (L) 08/11/2019   CALCIUM 8.4 (L) 06/26/2023   PHOS 3.0 07/02/2019   PROT 6.6 06/26/2023   ALBUMIN 2.9 (L) 06/26/2023   BILITOT 0.5 06/26/2023   ALKPHOS 73 06/26/2023   AST 19 06/26/2023   ALT 13 06/26/2023   ANIONGAP 11 06/26/2023    CBG (last 3)  No results for input(s): "GLUCAP" in the last 72 hours.    Coagulation Profile: No results for input(s): "INR", "PROTIME" in the last 168 hours.   Radiology Studies: No results found.     Kathlen Mody M.D. [facility] 06/28/2023, 5:59 PM  Available via Epic secure chat 7am-7pm After 7 pm, please refer to night coverage provider listed on amion.

## 2023-06-28 NOTE — Evaluation (Addendum)
Speech Language Pathology Evaluation Patient Details Name: Betty [name] MRN: [id number] DOB: 01-07-1946 Today's Date: 06/28/2023 Time: 0272-5366 SLP Time Calculation (min) (ACUTE ONLY): 13 min  Problem List:  Patient Active Problem List   Diagnosis Date Noted   Perforated appendicitis 06/25/2023   Acute metabolic encephalopathy 06/04/2023   Abdominal pain 06/04/2023   Abdominal mass 06/04/2023   Allergic rhinitis 12/14/2021   Bilateral pleural effusion 12/14/2021   Cardiac pacemaker in situ 12/14/2021   Coronary artery disease 12/14/2021   Esophagitis, unspecified with bleeding 12/14/2021   Gastro-esophageal reflux disease without esophagitis 12/14/2021   Gout 12/14/2021   Hardening of the aorta (main artery of the heart) (HCC) 12/14/2021   Left knee pain 12/14/2021   Malignant neoplasm of lower lobe, unspecified bronchus or lung (HCC) 12/14/2021   Mixed hyperlipidemia 12/14/2021   Multi-infarct dementia, uncomplicated (HCC) 12/14/2021   Personal history of colonic polyps 12/14/2021   Primary localized osteoarthritis of pelvic region and thigh 12/14/2021   Tobacco user 12/14/2021   Vitamin D deficiency 12/14/2021   CKD (chronic kidney disease) stage 5, GFR less than 15 ml/min (HCC) 11/14/2021   GI bleed 06/24/2019   Hypotension 06/24/2019   Electrolyte abnormality 06/24/2019   Pressure injury of skin 05/29/2019   Altered mental status    Acute respiratory failure with hypoxemia (HCC)    COVID-19 05/14/2019   Essential hypertension 05/14/2019   Dementia (HCC) 07/16/2018   Cerebral aneurysm, nonruptured 07/16/2018   Nonischemic cardiomyopathy (HCC) 05/21/2018   Congestive heart failure, NYHA class 3, chronic, systolic (HCC) 05/21/2018   Chronic systolic heart failure (HCC)    Chest pain 04/22/2018   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   NSTEMI (non-ST elevated myocardial infarction) (HCC) 03/24/2018   Pure hypercholesterolemia    CKD (chronic kidney  disease), stage IV (HCC)    Non-small cell carcinoma of right lung, stage 1 (HCC) 05/17/2015   S/P lobectomy of lung 04/29/2015   Past Medical History:  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)    Arthritis    bursitis in right shoulder (05/21/2018)   Blood transfusion without reported diagnosis    Cerebral aneurysm, nonruptured 07/16/2018   Right anterior communicating artery   CKD (chronic kidney disease), stage IV (HCC)    Colon polyps    COPD (chronic obstructive pulmonary disease) (HCC)    emphysema   Dementia (HCC) 07/16/2018   GERD (gastroesophageal reflux disease)    Hypercholesterolemia    Hypertension    Non-small cell carcinoma of right lung, stage 1 (HCC) 05/17/2015   Stage IB, right upper lobectomy 04/29/15, chemo    NSTEMI (non-ST elevated myocardial infarction) (HCC) 03/24/2018   Presence of permanent cardiac pacemaker 05/21/2018   Pure hypercholesterolemia    Sciatica of right side    Seasonal allergic rhinitis    Tobacco dependence    Tubular adenoma of colon    Vitamin D deficiency    Past Surgical History:  Past Surgical History:  Procedure Laterality Date   BIOPSY  06/26/2019   Procedure: BIOPSY;  Surgeon: Charlott Rakes, MD;  Location: [facility] ENDOSCOPY;  Service: Endoscopy;;   BIV PACEMAKER INSERTION CRT-P N/A 05/21/2018   Procedure: BIV PACEMAKER INSERTION CRT-P;  Surgeon: Duke Salvia, MD;  Location[facility] INVASIVE CV LAB;  Service: Cardiovascular;  Laterality: N/A;   BREAST SURGERY     small mass removed from left breast--benign   CRYO INTERCOSTAL NERVE BLOCK Right 04/29/2015   Procedure:  CRYO INTERCOSTAL NERVE BLOCK;  Surgeon: Loreli Slot, MD;  Location: [facility] OR;  Service: Thoracic;  Laterality: Right;   ESOPHAGOGASTRODUODENOSCOPY (EGD) WITH PROPOFOL N/A 06/26/2019   Procedure: ESOPHAGOGASTRODUODENOSCOPY (EGD) WITH PROPOFOL;  Surgeon: Charlott Rakes, MD;  Location: [facility] ENDOSCOPY;   Service: Endoscopy;  Laterality: N/A;   INSERT / REPLACE / REMOVE PACEMAKER  05/21/2018   LOBECTOMY Right 04/29/2015   Procedure: RIGHT LOWER LUNG LOBECTOMY ;  Surgeon: Loreli Slot, MD;  Location: [facility] OR;  Service: Thoracic;  Laterality: Right;   LYMPH NODE DISSECTION Right 04/29/2015   Procedure: RIGHT LUNG LYMPH NODE DISSECTION;  Surgeon: Loreli Slot, MD;  Location: [facility] OR;  Service: Thoracic;  Laterality: Right;   RIGHT/LEFT HEART CATH AND CORONARY ANGIOGRAPHY N/A 04/24/2018   Procedure: RIGHT/LEFT HEART CATH AND CORONARY ANGIOGRAPHY;  Surgeon: Runell Gess, MD;  Location: [facility] INVASIVE CV LAB;  Service: Cardiovascular;  Laterality: N/A;   VAGINAL DELIVERY     51 yrs ago   VIDEO ASSISTED THORACOSCOPY (VATS)/ LOBECTOMY Right 04/29/2015   Procedure: RIGHT VIDEO ASSISTED THORACOSCOPY (VATS) WEDGE RESECTION/ RIGHT LOWER LOBECTOMY, CRYO-ANALGESIA OF INTERCOSTAL NERVES;  Surgeon: Loreli Slot, MD;  Location: [facility] OR;  Service: Thoracic;  Laterality: Right;   HPI:  Pt is 77 yo female who presents on 06/24/23 with shortness of breath. Found to have perforated appendicitis and currently receiving non-operative management.  PMH: dementia (most recent MMSE 21/30 in 2023, before that 17/30 in 2022), CKD4, CHF, COPD, NSC lung cancer s/p lobectomy, NSTEMI, HTN, HLD, GERD   Assessment / Plan / Recommendation Clinical Impression  Pt scored 13/30 on the MMSE, which is lower than her two most recent scores (see HPI). This is indicative of a decline in cognition, and increased supervision is recommended upon discharge. Impairments are noted most significantly with orientation, sustained attention, and recall. She did also have some errors within the language section, although her spontaneous verbal output was appropriate. Could consider SLP f/u at next level of care as well.    SLP Assessment  SLP Recommendation/Assessment: All further Speech Lanaguage Pathology  needs can be addressed in  the next venue of care SLP Visit Diagnosis: Cognitive communication deficit (R41.841)    Recommendations for follow up therapy are one component of a multi-disciplinary discharge planning process, led by the attending physician.  Recommendations may be updated based on patient status, additional functional criteria and insurance authorization.    Follow Up Recommendations  Skilled nursing-short term rehab (<3 hours/day)    Assistance Recommended at Discharge  Frequent or constant Supervision/Assistance  Functional Status Assessment Patient has had a recent decline in their functional status and/or demonstrates limited ability to make significant improvements in function in a reasonable and predictable amount of time  Frequency and Duration           SLP Evaluation Cognition  Overall Cognitive Status: Impaired/Different from baseline Arousal/Alertness: Awake/alert Orientation Level: Oriented to person;Disoriented to place;Disoriented to time Year: Other (Comment) [phone]) Month: August Day of Week: Incorrect Attention: Sustained Sustained Attention: Impaired Sustained Attention Impairment: Verbal basic Memory: Impaired Memory Impairment: Retrieval deficit Awareness: Impaired Awareness Impairment: Emergent impairment;Intellectual impairment Problem Solving: Impaired Problem Solving Impairment: Verbal basic Safety/Judgment: Impaired       Comprehension  Auditory Comprehension Overall Auditory Comprehension: Impaired Commands: Within Functional Limits (simple) Conversation: Simple Interfering Components: Working memory    Expression Expression Primary Mode of Expression: Verbal Verbal Expression Overall Verbal Expression: Appears within functional limits for tasks assessed   Oral /  Motor  Motor Speech Overall Motor Speech: Appears within functional limits for tasks assessed             Mahala Menghini., M.A. CCC-SLP Acute Rehabilitation Services Office [phone]  Secure  chat preferred  06/28/2023, 12:41 PM

## 2023-06-28 NOTE — [place] FL2 (Signed)
[place] MEDICAID FL2 LEVEL OF CARE FORM     IDENTIFICATION  Patient Name: Betty [name] Birthdate: [birth year] Sex: female Admission Date (Current Location): 06/24/2023  [facility] [place] and [place] Number:  Producer, television/film/video and Address:         Provider Number: [id number]  Attending Physician Name and Address:  Kathlen Mody, MD  Relative Name and Phone Number:  Verdine Dring [phone]    Current Level of Care: Hospital Recommended Level of Care: Skilled Nursing Facility Prior Approval Number:    Date Approved/Denied:   PASRR Number: [id number]  Discharge Plan: SNF    Current Diagnoses: Patient Active Problem List   Diagnosis Date Noted   Perforated appendicitis 06/25/2023   Acute metabolic encephalopathy 06/04/2023   Abdominal pain 06/04/2023   Abdominal mass 06/04/2023   Allergic rhinitis 12/14/2021   Bilateral pleural effusion 12/14/2021   Cardiac pacemaker in situ 12/14/2021   Coronary artery disease 12/14/2021   Esophagitis, unspecified with bleeding 12/14/2021   Gastro-esophageal reflux disease without esophagitis 12/14/2021   Gout 12/14/2021   Hardening of the aorta (main artery of the heart) (HCC) 12/14/2021   Left knee pain 12/14/2021   Malignant neoplasm of lower lobe, unspecified bronchus or lung (HCC) 12/14/2021   Mixed hyperlipidemia 12/14/2021   Multi-infarct dementia, uncomplicated (HCC) 12/14/2021   Personal history of colonic polyps 12/14/2021   Primary localized osteoarthritis of pelvic region and thigh 12/14/2021   Tobacco user 12/14/2021   Vitamin D deficiency 12/14/2021   CKD (chronic kidney disease) stage 5, GFR less than 15 ml/min (HCC) 11/14/2021   GI bleed 06/24/2019   Hypotension 06/24/2019   Electrolyte abnormality 06/24/2019   Pressure injury of skin 05/29/2019   Altered mental status    Acute respiratory failure with hypoxemia (HCC)    COVID-19 05/14/2019   Essential hypertension 05/14/2019   Dementia (HCC) 07/16/2018    Cerebral aneurysm, nonruptured 07/16/2018   Nonischemic cardiomyopathy (HCC) 05/21/2018   Congestive heart failure, NYHA class 3, chronic, systolic (HCC) 05/21/2018   Chronic systolic heart failure (HCC)    Chest pain 04/22/2018   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   NSTEMI (non-ST elevated myocardial infarction) (HCC) 03/24/2018   Pure hypercholesterolemia    CKD (chronic kidney disease), stage IV (HCC)    Non-small cell carcinoma of right lung, stage 1 (HCC) 05/17/2015   S/P lobectomy of lung 04/29/2015    Orientation RESPIRATION BLADDER Height & Weight     Self, Situation  Normal Incontinent Weight: 55.8 kg Height:  5\' 4"  (162.6 cm)  BEHAVIORAL SYMPTOMS/MOOD NEUROLOGICAL BOWEL NUTRITION STATUS      Incontinent Diet (See discharge Summary)  AMBULATORY STATUS COMMUNICATION OF NEEDS Skin   Limited Assist Verbally Normal                       Personal Care Assistance Level of Assistance  Total care       Total Care Assistance: Limited assistance   Functional Limitations Info             SPECIAL CARE FACTORS FREQUENCY  OT (By licensed OT), PT (By licensed PT), Bowel and bladder program     PT Frequency: 5 x a week OT Frequency: 5 x a week Bowel and Bladder Program Frequency: per facility protocol          Contractures Contractures Info: Not present    Additional Factors Info  Code Status Code Status Info: DNR  Current Medications (06/28/2023):  This is the current hospital active medication list Current Facility-Administered Medications  Medication Dose Route Frequency Provider Last Rate Last Admin   acetaminophen (TYLENOL) tablet 650 mg  650 mg Oral Q6H PRN Hugelmeyer, Alexis, DO       Or   acetaminophen (TYLENOL) suppository 650 mg  650 mg Rectal Q6H PRN Hugelmeyer, Alexis, DO       amoxicillin-clavulanate (AUGMENTIN) 500-125 MG per tablet 1 tablet  1 tablet Oral QHS Kathlen Mody, MD   1 tablet at 06/27/23 2122    aspirin EC tablet 81 mg  81 mg Oral QHS Hugelmeyer, Alexis, DO   81 mg at 06/27/23 2122   atorvastatin (LIPITOR) tablet 40 mg  40 mg Oral Daily Hugelmeyer, Alexis, DO   40 mg at 06/28/23 1034   bisacodyl (DULCOLAX) EC tablet 5 mg  5 mg Oral Daily PRN Hugelmeyer, Alexis, DO       cholecalciferol (VITAMIN D3) 25 MCG (1000 UNIT) tablet 1,000 Units  1,000 Units Oral QHS Hugelmeyer, Alexis, DO   1,000 Units at 06/27/23 2122   donepezil (ARICEPT) tablet 10 mg  10 mg Oral QHS Hugelmeyer, Alexis, DO   10 mg at 06/27/23 2122   enoxaparin (LOVENOX) injection 30 mg  30 mg Subcutaneous Q24H Hugelmeyer, Alexis, DO   30 mg at 06/27/23 2122   febuxostat (ULORIC) tablet 40 mg  40 mg Oral QHS Hugelmeyer, Alexis, DO   40 mg at 06/27/23 2122   hydrALAZINE (APRESOLINE) injection 5 mg  5 mg Intravenous Q8H PRN Hugelmeyer, Alexis, DO       hydrALAZINE (APRESOLINE) tablet 50 mg  50 mg Oral Q8H Hugelmeyer, Alexis, DO   50 mg at 06/28/23 0532   HYDROcodone-acetaminophen (NORCO/VICODIN) 5-325 MG per tablet 1-2 tablet  1-2 tablet Oral Q4H PRN Hugelmeyer, Alexis, DO       ipratropium-albuterol (DUONEB) 0.5-2.5 (3) MG/3ML nebulizer solution 3 mL  3 mL Nebulization Q6H PRN Hugelmeyer, Alexis, DO       isosorbide dinitrate (ISORDIL) tablet 20 mg  20 mg Oral TID Hugelmeyer, Alexis, DO   20 mg at 06/28/23 1033   lactated ringers infusion   Intravenous Continuous Hugelmeyer, Alexis, DO   Stopped at 06/25/23 1454   loratadine (CLARITIN) tablet 10 mg  10 mg Oral Daily Hugelmeyer, Alexis, DO   10 mg at 06/28/23 1034   memantine (NAMENDA) tablet 10 mg  10 mg Oral BID Hugelmeyer, Alexis, DO   10 mg at 06/28/23 1034   metoprolol succinate (TOPROL-XL) 24 hr tablet 50 mg  50 mg Oral Daily Hugelmeyer, Alexis, DO   50 mg at 06/28/23 1033   morphine (PF) 2 MG/ML injection 1 mg  1 mg Intravenous Q6H PRN Hugelmeyer, Alexis, DO       multivitamin with minerals tablet 0.5 tablet  0.5 tablet Oral QHS Hugelmeyer, Alexis, DO   0.5 tablet at 06/27/23  2122   nitroGLYCERIN (NITROSTAT) SL tablet 0.4 mg  0.4 mg Sublingual Q5 Min x 3 PRN Hugelmeyer, Alexis, DO       ondansetron (ZOFRAN) tablet 4 mg  4 mg Oral Q6H PRN Hugelmeyer, Alexis, DO       Or   ondansetron (ZOFRAN) injection 4 mg  4 mg Intravenous Q6H PRN Hugelmeyer, Alexis, DO       Oral care mouth rinse  15 mL Mouth Rinse PRN Kathlen Mody, MD       pantoprazole (PROTONIX) EC tablet 40 mg  40 mg Oral q morning Hugelmeyer, Alexis, DO  40 mg at 06/28/23 1034   QUEtiapine (SEROQUEL) tablet 25 mg  25 mg Oral QHS Hugelmeyer, Alexis, DO   25 mg at 06/27/23 2122   senna-docusate (Senokot-S) tablet 1 tablet  1 tablet Oral QHS PRN Hugelmeyer, Alexis, DO       sodium bicarbonate tablet 650 mg  650 mg Oral BID Hugelmeyer, Alexis, DO   650 mg at 06/28/23 1034   sodium chloride flush (NS) 0.9 % injection 3 mL  3 mL Intravenous Q12H Hugelmeyer, Alexis, DO   3 mL at 06/28/23 1034   traZODone (DESYREL) tablet 25 mg  25 mg Oral QHS PRN Hugelmeyer, Alexis, DO         Discharge Medications: Please see discharge summary for a list of discharge medications.  Relevant Imaging Results:  Relevant Lab Results:   Additional Information SSN [id number]  Lockie Pares, RN

## 2023-06-28 NOTE — Progress Notes (Addendum)
Mobility Specialist: Progress Note   06/28/23 1124  Mobility  Activity Ambulated with assistance to bathroom  Level of Assistance Standby assist, set-up cues, supervision of patient - no hands on  Assistive Device Other (Comment) (HHA)  Distance Ambulated (ft) 20 ft  Activity Response Tolerated well  Mobility Referral Yes  $Mobility charge 1 Mobility  Mobility Specialist Start Time (ACUTE ONLY) 1051  Mobility Specialist Stop Time (ACUTE ONLY) 1122  Mobility Specialist Time Calculation (min) (ACUTE ONLY) 31 min    Pt was agreeable to mobility session - received in bed. Deferred further ambulation d/t incontinence. Pt had a BM and void in bed - ambulated to the bathroom. Pt was able to complete peri care independently while MS changed brief with pad and replaced gown. Pt then ambulated to chair while MS assisted with changing bed sheets, mat, and blankets. Pt then returned to bed. Left in bed with all needs met, call bell in reach.   Maurene Capes Mobility Specialist Please contact via SecureChat or Rehab office at [phone]

## 2023-06-28 NOTE — Progress Notes (Addendum)
Palliative Medicine Progress Note   Patient Name: Betty [name]       Date: 06/28/2023 DOB: [birth year]  Age: 77 y.o. MRN#: [id number] Attending Physician: Kathlen Mody, MD Primary Care Physician: Collene Mares, [place] Admit Date: 06/24/2023    HPI/Patient Profile: 77 y.o. female  with past medical history of HFrEF (EF 30 to 35% on echo January 2024), CKD stage IV, COPD, non-small cell lung cancer s/p right lobectomy and chemotherapy in 2016, dementia, CAD status post MI, pacemaker, HTN, and HLD. She presented to the ED on 06/24/2023 with weakness, shortness of breath, and altered mental status.  She was found to have perforated appendicitis, AKI on CKD 4, possible UTI, and new pulmonary nodules. Palliative Medicine has been consulted for goals of care.  Subjective: Chart reviewed and patient assessed at bedside. She is sleeping on the couch in her room; I did not attempt to wake her.   Patient was evaluated by PT yesterday 8/22. Per their note, patient needs 24/7 supervision at discharge as she is at very high risk for falls and re-hospitalization due to current functional and cognitive status. Recommendation is for SNF/rehab.   I called son/[name]; he did not answer; secure voicemail left.    Objective:  Physical Exam Vitals reviewed.  Constitutional:      General: She is sleeping. She is not in acute distress.    Appearance: She is ill-appearing.  Pulmonary:     Effort: Pulmonary effort is normal.             Palliative Medicine Assessment & Plan   Assessment: Principal Problem:   Perforated appendicitis    Recommendations/Plan: Continue current supportive interventions  MOST form completed on 8/21 (DNR, comfort measures, determine use of antibiotics when infection occurs, IV  fluids for a defined trial period, no feeding tube) Disposition plan is for SNF/rehab Outpatient palliative at discharge   Code Status: DNR   Prognosis:  Difficult to determine, but less than 6 months would not be surprising     Thank you for allowing the Palliative Medicine Team to assist in the care of this patient.   MDM - low   Merry Proud, NP   Please contact Palliative Medicine Team phone at [phone] for questions and concerns.  For individual providers, please see AMION.

## 2023-06-28 NOTE — Care Management Important Message (Signed)
Important Message  Patient Details  Name: Betty [name] MRN: [id number] Date of Birth: [birth year]   Medicare Important Message Given:  Yes     Renie Ora 06/28/2023, 9:13 AM

## 2023-06-29 DIAGNOSIS — I10 Essential (primary) hypertension: Secondary | ICD-10-CM | POA: Diagnosis not present

## 2023-06-29 DIAGNOSIS — K3532 Acute appendicitis with perforation and localized peritonitis, without abscess: Secondary | ICD-10-CM | POA: Diagnosis not present

## 2023-06-29 LAB — BASIC METABOLIC PANEL
Anion gap: 8 (ref 5–15)
BUN: 35 mg/dL — ABNORMAL HIGH (ref 8–23)
CO2: 25 mmol/L (ref 22–32)
Calcium: 9.1 mg/dL (ref 8.9–10.3)
Chloride: 109 mmol/L (ref 98–111)
Creatinine, Ser: 3.81 mg/dL — ABNORMAL HIGH (ref 0.44–1.00)
GFR, Estimated: 12 mL/min — ABNORMAL LOW (ref >60)
Glucose, Bld: 103 mg/dL — ABNORMAL HIGH (ref 70–99)
Potassium: 3.8 mmol/L (ref 3.5–5.1)
Sodium: 142 mmol/L (ref 135–145)

## 2023-06-29 NOTE — Progress Notes (Signed)
[facility]                                                                               Betty [name], is a 77 y.o. female, DOB - 04/05/1946, JXB:[id number] Admit date - 06/24/2023    Outpatient Primary MD for the patient is [facility], [place], [place]  LOS - 4  days    Brief summary   Betty [name] is a 77 y.o. female with a known history of acute combined systolic and diastolic heart failure, CKD stage IV, COPD, dementia, GERD, hypertension, hyperlipidemia, non-small cell carcinoma of the lung, CAD status post MI, pacemaker, presents to the emergency department for evaluation of shortness of breath.  Patient son found her today with unsteady gait, shortness of breath, dyspnea on exertion and confused . She was found to have perforated appendicitis.    Assessment & Plan    Assessment and Plan:    Perforated appendicitis - admitted for IV antibiotics and IV fluids. Transitioned to oral augmentin to complete the course.  - further management as per general surgery.  - Pain control - initial  plan was to repeat CT in 3 to 5 days, but her abd pain has improved. Unfortunately she  cannot have MRI due to pacemaker.  But she is asymptomatic, no nausea or vomiting or abd pain.  She is tolerating diet .   C diff pCR is negative.     #. Acute kidney injury on CKD 4 Hydrate and repeat renal parameters show stable creatinine at 3.8.    Possible urinary tract infection - Transitioned  to augmentin.     New pulmonary nodules in the setting of non-small cell lung cancer -Will need follow-up with oncology for further workup if desired by family   History of acute combined systolic and diastolic heart failure - Continue metoprolol, atorvastatin, isosorbide dinitrate   History of CAD status post MI - Continue atorvastatin, nitroglycerin, aspirin    History of COPD  No wheezing heard.  - Continue O2 and nebs as needed, Claritin    History of GERD Stable.     History of  dementia -Continue donepezil and memantine. She will need 24 hour supervision on discharge as per PT.  Toc on board.  - in view of declining cognition and generalized weakness, poor progression. Palliative care consulted and recommendations given.  She will benefit from outpatient palliative services on discharge.           Estimated body mass index is 21.12 kg/m as calculated from the following:   Height as of this encounter: 5\' 4"  (1.626 m).   Weight as of this encounter: 55.8 kg.  Code Status: full code.  DVT Prophylaxis:  enoxaparin (LOVENOX) injection 30 mg Start: 06/25/23 2200 SCDs Start: 06/25/23 1124   Level of Care: Level of care: Telemetry Medical Family Communication: none at bedside. Discussed with son over the phone on 06/28/23.   Consultants:   Surgery.  Palliative care services.   Antimicrobials:   Anti-infectives (From admission, onward)    Start     Dose/Rate Route Frequency Ordered Stop   06/27/23 2200  amoxicillin-clavulanate (AUGMENTIN) 500-125 MG per tablet 1 tablet  1 tablet Oral Daily at bedtime 06/27/23 1455     06/27/23 0000  amoxicillin-clavulanate (AUGMENTIN) 875-125 MG tablet  Status:  Discontinued        1 tablet Oral 2 times daily 06/27/23 1454 06/27/23    06/27/23 0000  amoxicillin-clavulanate (AUGMENTIN) 500-125 MG tablet        1 tablet Oral Daily at bedtime 06/27/23 1528 07/10/23 2359   06/25/23 1030  piperacillin-tazobactam (ZOSYN) IVPB 2.25 g  Status:  Discontinued        2.25 g 100 mL/hr over 30 Minutes Intravenous Every 8 hours 06/25/23 0945 06/27/23 1455   06/25/23 0200  piperacillin-tazobactam (ZOSYN) IVPB 3.375 g        3.375 g 100 mL/hr over 30 Minutes Intravenous  Once 06/25/23 0158 06/25/23 0247        Medications  Scheduled Meds:  amoxicillin-clavulanate  1 tablet Oral QHS   aspirin EC  81 mg Oral QHS   atorvastatin  40 mg Oral Daily   cholecalciferol  1,000 Units Oral QHS   donepezil  10 mg Oral QHS    enoxaparin (LOVENOX) injection  30 mg Subcutaneous Q24H   febuxostat  40 mg Oral QHS   hydrALAZINE  50 mg Oral Q8H   isosorbide dinitrate  20 mg Oral TID   loratadine  10 mg Oral Daily   memantine  10 mg Oral BID   metoprolol succinate  50 mg Oral Daily   multivitamin with minerals  0.5 tablet Oral QHS   pantoprazole  40 mg Oral q morning   QUEtiapine  25 mg Oral QHS   sodium bicarbonate  650 mg Oral BID   sodium chloride flush  3 mL Intravenous Q12H   Continuous Infusions:  lactated ringers Stopped (06/25/23 1454)   PRN Meds:.acetaminophen **OR** acetaminophen, bisacodyl, hydrALAZINE, HYDROcodone-acetaminophen, ipratropium-albuterol, morphine injection, nitroGLYCERIN, ondansetron **OR** ondansetron (ZOFRAN) IV, mouth rinse, senna-docusate, traZODone    Subjective:   Betty [name] was seen and examined today.   No new complaints today.    Objective:   Vitals:   06/28/23 1601 06/28/23 2056 06/29/23 0720 06/29/23 1537  BP: 103/62 133/64 (!) 115/58 (!) 102/49  Pulse: 62 61 60 67  Resp: 16 19 16 16   Temp: 98.7 F (37.1 C) 99 F (37.2 C) 97.6 F (36.4 C) 98.1 F (36.7 C)  TempSrc: Oral Oral Oral Oral  SpO2: 94% 100% 100% 99%  Weight:      Height:        Intake/Output Summary (Last 24 hours) at 06/29/2023 1650 Last data filed at 06/28/2023 2324 Gross per 24 hour  Intake 225 ml  Output --  Net 225 ml    Filed Weights   06/24/23 1413  Weight: 55.8 kg     Exam General exam: Appears calm and comfortable  Respiratory system: Clear to auscultation. Respiratory effort normal. Cardiovascular system: S1 & S2 heard, RRR. No JVD,  Gastrointestinal system: Abdomen is nondistended, soft and nontender.  Central nervous system: Alert and oriented to person only.  Extremities: Symmetric 5 x 5 power. Skin: No rashes,  Psychiatry: mood is appropriate.        Data Reviewed:  I have personally reviewed following labs and imaging studies   CBC Lab Results  Component Value  Date   WBC 8.2 06/26/2023   RBC 3.33 (L) 06/26/2023   HGB 10.2 (L) 06/26/2023   HCT 30.9 (L) 06/26/2023   MCV 92.8 06/26/2023   MCH 30.6 06/26/2023   PLT 333 06/26/2023  MCHC 33.0 06/26/2023   RDW 12.8 06/26/2023   LYMPHSABS 1.2 06/04/2023   MONOABS 0.7 06/04/2023   EOSABS 0.0 06/04/2023   BASOSABS 0.0 06/04/2023     Last metabolic panel Lab Results  Component Value Date   NA 142 06/29/2023   K 3.8 06/29/2023   CL 109 06/29/2023   CO2 25 06/29/2023   BUN 35 (H) 06/29/2023   CREATININE 3.81 (H) 06/29/2023   GLUCOSE 103 (H) 06/29/2023   GFRNONAA 12 (L) 06/29/2023   GFRAA 23 (L) 08/11/2019   CALCIUM 9.1 06/29/2023   PHOS 3.0 07/02/2019   PROT 6.6 06/26/2023   ALBUMIN 2.9 (L) 06/26/2023   BILITOT 0.5 06/26/2023   ALKPHOS 73 06/26/2023   AST 19 06/26/2023   ALT 13 06/26/2023   ANIONGAP 8 06/29/2023    CBG (last 3)  No results for input(s): "GLUCAP" in the last 72 hours.    Coagulation Profile: No results for input(s): "INR", "PROTIME" in the last 168 hours.   Radiology Studies: No results found.     Kathlen Mody M.D. [facility] 06/29/2023, 4:50 PM  Available via Epic secure chat 7am-7pm After 7 pm, please refer to night coverage provider listed on amion.

## 2023-06-29 NOTE — Progress Notes (Signed)
Pt is refusing labs, vitals, and medications at this time. She is at her baseline A/OX1, but does not want to take any medications nor does she want anyone in the room. Phlebotomy tried to get labs twice this morning and pt refused each time. Offered snacks, support, and encouragement and pt still refused.

## 2023-06-29 NOTE — Plan of Care (Signed)
  Problem: Nutrition: Goal: Adequate nutrition will be maintained Outcome: Progressing   Problem: Coping: Goal: Level of anxiety will decrease Outcome: Progressing   Problem: Pain Managment: Goal: General experience of comfort will improve Outcome: Progressing   Problem: Safety: Goal: Ability to remain free from injury will improve Outcome: Progressing   

## 2023-06-30 DIAGNOSIS — K3532 Acute appendicitis with perforation and localized peritonitis, without abscess: Secondary | ICD-10-CM | POA: Diagnosis not present

## 2023-06-30 DIAGNOSIS — I10 Essential (primary) hypertension: Secondary | ICD-10-CM | POA: Diagnosis not present

## 2023-06-30 LAB — CULTURE, BLOOD (ROUTINE X 2)
Culture: NO GROWTH
Culture: NO GROWTH
Special Requests: ADEQUATE
Special Requests: ADEQUATE

## 2023-06-30 NOTE — Progress Notes (Signed)
Physical Therapy Treatment Patient Details Name: Betty [name] MRN: [id number] DOB: 09-26-1946 Today's Date: 06/30/2023   History of Present Illness Pt is 77 yo female who presents on 06/24/23 with shortness of breathe. Found to have perforated appendicitis and currently receiving non-operative management.  PMH: dementia, CKD4, CHF, COPD, NSC lung cancer s/p lobectomy, NSTEMI, HTN, HLD, GERD    PT Comments  Pt tolerated treatment well today. Pt able to ambulate in hallway with CGA 1 person HHA. No change in DC/DME recs at this time.  PT will continue to follow.    If plan is discharge home, recommend the following: A little help with walking and/or transfers;Assistance with cooking/housework;Assist for transportation;Help with stairs or ramp for entrance   Can travel by private vehicle     Yes  Equipment Recommendations  None recommended by PT    Recommendations for Other Services       Precautions / Restrictions Precautions Precautions: Fall Restrictions Weight Bearing Restrictions: No     Mobility  Bed Mobility Overal bed mobility: Modified Independent Bed Mobility: Supine to Sit, Sit to Supine     Supine to sit: Modified independent (Device/Increase time) Sit to supine: Modified independent (Device/Increase time)        Transfers Overall transfer level: Needs assistance Equipment used: 1 person hand held assist Transfers: Sit to/from Stand Sit to Stand: Contact guard assist           General transfer comment: CGA for sit to stand for stability    Ambulation/Gait Ambulation/Gait assistance: Contact guard assist Gait Distance (Feet): 100 Feet Assistive device: 1 person hand held assist Gait Pattern/deviations: Step-through pattern, Step-to pattern, Narrow base of support Gait velocity: significant decrease in cadence.     General Gait Details: Very low foot clearance, narrow BOS, partial step through to step to gait pattern. Almost a shuffling gait pattern.  Pt unable to correct when cued.   Stairs             Wheelchair Mobility     Tilt Bed    Modified Rankin (Stroke Patients Only)       Balance Overall balance assessment: Needs assistance Sitting-balance support: Feet unsupported, No upper extremity supported Sitting balance-Leahy Scale: Good     Standing balance support: Bilateral upper extremity supported Standing balance-Leahy Scale: Poor Standing balance comment: needs UE support to maintain balance                            Cognition Arousal: Alert Behavior During Therapy: WFL for tasks assessed/performed Overall Cognitive Status: No family/caregiver present to determine baseline cognitive functioning                                 General Comments: Pt is oriented to self and birthday. Otherwise pt is not oriented. Pt states that this is first time she has got up and walked since she been here despite notes suggesting otherwise.        Exercises      General Comments General comments (skin integrity, edema, etc.): VSS      Pertinent Vitals/Pain Pain Assessment Pain Assessment: No/denies pain    Home Living                          Prior Function            PT  Goals (current goals can now be found in the care plan section) Progress towards PT goals: Progressing toward goals    Frequency    Min 1X/week      PT Plan      Co-evaluation              AM-PAC PT "6 Clicks" Mobility   Outcome Measure  Help needed turning from your back to your side while in a flat bed without using bedrails?: A Little Help needed moving from lying on your back to sitting on the side of a flat bed without using bedrails?: A Little Help needed moving to and from a bed to a chair (including a wheelchair)?: A Little Help needed standing up from a chair using your arms (e.g., wheelchair or bedside chair)?: A Little Help needed to walk in hospital room?: A Little Help  needed climbing 3-5 steps with a railing? : A Little 6 Click Score: 18    End of Session Equipment Utilized During Treatment: Gait belt Activity Tolerance: Patient tolerated treatment well Patient left: in bed;with call bell/phone within reach Nurse Communication: Mobility status PT Visit Diagnosis: Unsteadiness on feet (R26.81);Difficulty in walking, not elsewhere classified (R26.2);Pain     Time: 1829-9371 PT Time Calculation (min) (ACUTE ONLY): 8 min  Charges:    $Gait Training: 8-22 mins PT General Charges $$ ACUTE PT VISIT: 1 Visit                     Shela Nevin, PT, DPT Acute Rehab Services [phone]    Betty [name] 06/30/2023, 4:28 PM

## 2023-06-30 NOTE — Progress Notes (Signed)
[facility]                                                                               Norman Koler, is a 77 y.o. female, DOB - 04/05/1946, UJW:[id number] Admit date - 06/24/2023    Outpatient Primary MD for the patient is [place], [place], [place]  LOS - 5  days    Brief summary   Betty [name] is a 77 y.o. female with a known history of acute combined systolic and diastolic heart failure, CKD stage IV, COPD, dementia, GERD, hypertension, hyperlipidemia, non-small cell carcinoma of the lung, CAD status post MI, pacemaker, presents to the emergency department for evaluation of shortness of breath.  Patient son found her today with unsteady gait, shortness of breath, dyspnea on exertion and confused . She was found to have perforated appendicitis.    Assessment & Plan    Assessment and Plan:    Perforated appendicitis - admitted for IV antibiotics and IV fluids. Transitioned to oral augmentin to complete the course.  - further management as per general surgery.  - Pain control - initial  plan was to repeat CT in 3 to 5 days, but her abd pain has improved. Unfortunately she  cannot have MRI due to pacemaker.  But she is asymptomatic, no nausea or vomiting or abd pain.  She is tolerating diet .   C diff pCR is negative.     #. Acute kidney injury on CKD 4 Hydrate and repeat renal parameters show stable creatinine at 3.8. Check Creatinine in am.      Possible urinary tract infection - Transitioned  to augmentin.     New pulmonary nodules in the setting of non-small cell lung cancer -Will need follow-up with oncology for further workup if desired by family   Chronic combined systolic and diastolic heart failure - Continue metoprolol, atorvastatin, isosorbide dinitrate, holding hydralazine for softer BP parameters.    History of CAD status post MI - Continue atorvastatin, nitroglycerin, aspirin    History of COPD  No wheezing heard.  - Continue O2 and nebs as  needed, Claritin    History of GERD Stable.     History of dementia -Continue donepezil and memantine. She will need 24 hour supervision on discharge as per PT.  Toc on board.  - in view of declining cognition and generalized weakness, poor progression. Palliative care consulted and recommendations given.  She will benefit from outpatient palliative services on discharge.           Estimated body mass index is 21.12 kg/m as calculated from the following:   Height as of this encounter: 5\' 4"  (1.626 m).   Weight as of this encounter: 55.8 kg.  Code Status: full code.  DVT Prophylaxis:  enoxaparin (LOVENOX) injection 30 mg Start: 06/25/23 2200 SCDs Start: 06/25/23 1124   Level of Care: Level of care: Telemetry Medical Family Communication: none at bedside today.   Consultants:   Surgery.  Palliative care services.   Antimicrobials:   Anti-infectives (From admission, onward)    Start     Dose/Rate Route Frequency Ordered Stop   06/27/23 2200  amoxicillin-clavulanate (AUGMENTIN) 500-125 MG per  tablet 1 tablet        1 tablet Oral Daily at bedtime 06/27/23 1455     06/27/23 0000  amoxicillin-clavulanate (AUGMENTIN) 875-125 MG tablet  Status:  Discontinued        1 tablet Oral 2 times daily 06/27/23 1454 06/27/23    06/27/23 0000  amoxicillin-clavulanate (AUGMENTIN) 500-125 MG tablet        1 tablet Oral Daily at bedtime 06/27/23 1528 07/10/23 2359   06/25/23 1030  piperacillin-tazobactam (ZOSYN) IVPB 2.25 g  Status:  Discontinued        2.25 g 100 mL/hr over 30 Minutes Intravenous Every 8 hours 06/25/23 0945 06/27/23 1455   06/25/23 0200  piperacillin-tazobactam (ZOSYN) IVPB 3.375 g        3.375 g 100 mL/hr over 30 Minutes Intravenous  Once 06/25/23 0158 06/25/23 0247        Medications  Scheduled Meds:  amoxicillin-clavulanate  1 tablet Oral QHS   aspirin EC  81 mg Oral QHS   atorvastatin  40 mg Oral Daily   cholecalciferol  1,000 Units Oral QHS   donepezil   10 mg Oral QHS   enoxaparin (LOVENOX) injection  30 mg Subcutaneous Q24H   febuxostat  40 mg Oral QHS   hydrALAZINE  50 mg Oral Q8H   isosorbide dinitrate  20 mg Oral TID   loratadine  10 mg Oral Daily   memantine  10 mg Oral BID   metoprolol succinate  50 mg Oral Daily   multivitamin with minerals  0.5 tablet Oral QHS   pantoprazole  40 mg Oral q morning   QUEtiapine  25 mg Oral QHS   sodium bicarbonate  650 mg Oral BID   sodium chloride flush  3 mL Intravenous Q12H   Continuous Infusions:  lactated ringers Stopped (06/25/23 1454)   PRN Meds:.acetaminophen **OR** acetaminophen, bisacodyl, hydrALAZINE, HYDROcodone-acetaminophen, ipratropium-albuterol, morphine injection, nitroGLYCERIN, ondansetron **OR** ondansetron (ZOFRAN) IV, mouth rinse, senna-docusate, traZODone    Subjective:   Betty [name] was seen and examined today.   Comfortable, no new complaints today.    Objective:   Vitals:   06/29/23 1947 06/29/23 2128 06/30/23 0421 06/30/23 0740  BP: (!) 89/60 117/68 126/65 (!) 108/59  Pulse: 72 77 62 66  Resp: 17 17 18 18   Temp: 98.8 F (37.1 C) 98.9 F (37.2 C) 98.1 F (36.7 C) 98 F (36.7 C)  TempSrc: Oral Oral Oral Oral  SpO2: 100% 99% 100% 100%  Weight:      Height:        Intake/Output Summary (Last 24 hours) at 06/30/2023 1129 Last data filed at 06/29/2023 2012 Gross per 24 hour  Intake 225 ml  Output --  Net 225 ml    Filed Weights   06/24/23 1413  Weight: 55.8 kg     Exam General exam: Elderly lady not in any kind of distress Respiratory system: Clear to auscultation. Respiratory effort normal. Cardiovascular system: S1 & S2 heard, RRR. No JVD,  Gastrointestinal system: Abdomen is nondistended, soft and nontender.  Central nervous system: Alert and oriented to person only Extremities: no pedal edema.  Skin: No rashes,  Psychiatry: mood is appropriate.        Data Reviewed:  I have personally reviewed following labs and imaging  studies   CBC Lab Results  Component Value Date   WBC 8.2 06/26/2023   RBC 3.33 (L) 06/26/2023   HGB 10.2 (L) 06/26/2023   HCT 30.9 (L) 06/26/2023   MCV 92.8 06/26/2023  MCH 30.6 06/26/2023   PLT 333 06/26/2023   MCHC 33.0 06/26/2023   RDW 12.8 06/26/2023   LYMPHSABS 1.2 06/04/2023   MONOABS 0.7 06/04/2023   EOSABS 0.0 06/04/2023   BASOSABS 0.0 06/04/2023     Last metabolic panel Lab Results  Component Value Date   NA 142 06/29/2023   K 3.8 06/29/2023   CL 109 06/29/2023   CO2 25 06/29/2023   BUN 35 (H) 06/29/2023   CREATININE 3.81 (H) 06/29/2023   GLUCOSE 103 (H) 06/29/2023   GFRNONAA 12 (L) 06/29/2023   GFRAA 23 (L) 08/11/2019   CALCIUM 9.1 06/29/2023   PHOS 3.0 07/02/2019   PROT 6.6 06/26/2023   ALBUMIN 2.9 (L) 06/26/2023   BILITOT 0.5 06/26/2023   ALKPHOS 73 06/26/2023   AST 19 06/26/2023   ALT 13 06/26/2023   ANIONGAP 8 06/29/2023    CBG (last 3)  No results for input(s): "GLUCAP" in the last 72 hours.    Coagulation Profile: No results for input(s): "INR", "PROTIME" in the last 168 hours.   Radiology Studies: No results found.     Kathlen Mody M.D. [facility] 06/30/2023, 11:29 AM  Available via Epic secure chat 7am-7pm After 7 pm, please refer to night coverage provider listed on amion.

## 2023-07-01 DIAGNOSIS — I10 Essential (primary) hypertension: Secondary | ICD-10-CM | POA: Diagnosis not present

## 2023-07-01 DIAGNOSIS — K3532 Acute appendicitis with perforation and localized peritonitis, without abscess: Secondary | ICD-10-CM | POA: Diagnosis not present

## 2023-07-01 NOTE — Progress Notes (Signed)
Patient ID: Betty [name], female   DOB: 09/25/1946, 77 y.o.   MRN: [id number] [facility] Surgery Progress Note     Subjective: CC-  No complaints. Denies abdominal pain, n/v.  Afebrile. Last WBC WNL. Awaiting SNF.  Objective: Vital signs in last 24 hours: Temp:  [97.6 F (36.4 C)-99.1 F (37.3 C)] 97.6 F (36.4 C) (08/26 0527) Pulse Rate:  [58-61] 61 (08/26 0936) Resp:  [17-19] 17 (08/26 0527) BP: (97-118)/(64-84) 118/82 (08/26 0936) SpO2:  [98 %-100 %] 100 % (08/26 0527) Last BM Date : 06/28/23  Intake/Output from previous day: 08/25 0701 - 08/26 0700 In: 225 [P.O.:225] Out: -  Intake/Output this shift: No intake/output data recorded.  PE: Gen:  Alert, NAD, pleasant Abd: soft, ND, NT  Lab Results:  No results for input(s): "WBC", "HGB", "HCT", "PLT" in the last 72 hours. BMET Recent Labs    06/29/23 1058  NA 142  K 3.8  CL 109  CO2 25  GLUCOSE 103*  BUN 35*  CREATININE 3.81*  CALCIUM 9.1   PT/INR No results for input(s): "LABPROT", "INR" in the last 72 hours. CMP     Component Value Date/Time   NA 142 06/29/2023 1058   NA 143 06/02/2018 1505   NA 145 09/18/2016 1057   K 3.8 06/29/2023 1058   K 4.9 09/18/2016 1057   CL 109 06/29/2023 1058   CO2 25 06/29/2023 1058   CO2 22 09/18/2016 1057   GLUCOSE 103 (H) 06/29/2023 1058   GLUCOSE 93 09/18/2016 1057   BUN 35 (H) 06/29/2023 1058   BUN 28 (H) 06/02/2018 1505   BUN 39.6 (H) 09/18/2016 1057   CREATININE 3.81 (H) 06/29/2023 1058   CREATININE 2.66 (H) 08/12/2020 0948   CREATININE 2.8 (H) 09/18/2016 1057   CALCIUM 9.1 06/29/2023 1058   CALCIUM 9.8 09/18/2016 1057   PROT 6.6 06/26/2023 1605   PROT 7.6 09/18/2016 1057   ALBUMIN 2.9 (L) 06/26/2023 1605   ALBUMIN 3.6 09/18/2016 1057   AST 19 06/26/2023 1605   AST 21 08/12/2020 0948   AST 19 09/18/2016 1057   ALT 13 06/26/2023 1605   ALT 23 08/12/2020 0948   ALT 12 09/18/2016 1057   ALKPHOS 73 06/26/2023 1605   ALKPHOS 123 09/18/2016 1057    BILITOT 0.5 06/26/2023 1605   BILITOT 0.6 08/12/2020 0948   BILITOT 0.35 09/18/2016 1057   GFRNONAA 12 (L) 06/29/2023 1058   GFRNONAA 17 (L) 08/12/2020 0948   GFRAA 23 (L) 08/11/2019 1108   Lipase     Component Value Date/Time   LIPASE 44 06/24/2019 1348       Studies/Results: No results found.  Anti-infectives: Anti-infectives (From admission, onward)    Start     Dose/Rate Route Frequency Ordered Stop   06/27/23 2200  amoxicillin-clavulanate (AUGMENTIN) 500-125 MG per tablet 1 tablet        1 tablet Oral Daily at bedtime 06/27/23 1455     06/27/23 0000  amoxicillin-clavulanate (AUGMENTIN) 875-125 MG tablet  Status:  Discontinued        1 tablet Oral 2 times daily 06/27/23 1454 06/27/23    06/27/23 0000  amoxicillin-clavulanate (AUGMENTIN) 500-125 MG tablet        1 tablet Oral Daily at bedtime 06/27/23 1528 07/10/23 2359   06/25/23 1030  piperacillin-tazobactam (ZOSYN) IVPB 2.25 g  Status:  Discontinued        2.25 g 100 mL/hr over 30 Minutes Intravenous Every 8 hours 06/25/23 0945 06/27/23 1455  06/25/23 0200  piperacillin-tazobactam (ZOSYN) IVPB 3.375 g        3.375 g 100 mL/hr over 30 Minutes Intravenous  Once 06/25/23 0158 06/25/23 0247        Assessment/Plan 87F Perforated appendicitis - Afebrile without tachycardia or hypotension. WBC wnl on last check. No peritonitis on exam. Tolerating regular diet and having bowel function. No indication for emergency surgery - Okay for discharge from our standpoint. Would recommend 14d total of abx. We have arranged f/u in our office.  - Given overall clinical picture, if patient improves with consverative management would recommend GI f/u for colonoscopy in 4-6 weeks.  - To note, pelvis US w/ hypoechoic mass with internal vascularity in the right adnexa with indeterminate imaging features - MRI was recommended by radiology but appears she cannot get this 2/2 pacemaker. CA 125, CEA and AFP wnl.    FEN - Reg diet DVT -  SCDs, LMWH ID - Zosyn 8/20>>8/22, augmentin 8/22>> Dispo -  per primary     - Per [facility] -  Hx CHF Hx COPD Hx Dementia Hx CKD4 Hx of Non-small cell carcinoma of R lung s/p right upper lobectomy in 2016 and chemo - followed by Dr. Arbutus Ped  Multiple pulmonary nodules including RUL spiculated pulm nodule - agree with [facility] recs for Oncology f/u  I reviewed hospitalist notes, last 24 h vitals and pain scores, and last 48 h intake and output.    LOS: 6 days    Franne Forts, [facility] Surgery 07/01/2023, 11:27 AM Please see Amion for pager number during day hours 7:00am-4:30pm

## 2023-07-01 NOTE — Progress Notes (Signed)
[facility]                                                                               Betty [name], is a 77 y.o. female, DOB - 06-23-1946, ZOX:[id number] Admit date - 06/24/2023    Outpatient Primary MD for the patient is [place], [place], [place]  LOS - 6  days    Brief summary   Betty [name] is a 77 y.o. female with a known history of acute combined systolic and diastolic heart failure, CKD stage IV, COPD, dementia, GERD, hypertension, hyperlipidemia, non-small cell carcinoma of the lung, CAD status post MI, pacemaker, presents to the emergency department for evaluation of shortness of breath.  Patient son found her today with unsteady gait, shortness of breath, dyspnea on exertion and confused . She was found to have perforated appendicitis.    Assessment & Plan    Assessment and Plan:    Perforated appendicitis - admitted for IV antibiotics and IV fluids. Transitioned to oral augmentin to complete the course.  - further management as per general surgery.  - Pain control - initial  plan was to repeat CT in 3 to 5 days, but her abd pain has improved. Unfortunately she  cannot have MRI due to pacemaker.  She remains asymptomatic, no nausea or vomiting or abd pain.  She is tolerating diet .   C diff pCR is negative.  General surgery recommends a total of 14 days of antibiotics.  She will need follow up with GI for a colonoscopy in 4 to 6 weeks.     #. Acute kidney injury on CKD 4 Hydrated and repeat renal parameters show stable creatinine at 3.8.      Possible urinary tract infection - Transitioned  to augmentin.     New pulmonary nodules in the setting of non-small cell lung cancer -Will need follow-up with oncology for further workup if desired by family   Chronic combined systolic and diastolic heart failure - Continue metoprolol, atorvastatin, isosorbide dinitrate, holding hydralazine for softer BP parameters.    History of CAD status post MI - Continue  atorvastatin, nitroglycerin, aspirin    History of COPD  No wheezing heard.  - Continue O2 and nebs as needed, Claritin    History of GERD Stable.     History of dementia -Continue donepezil and memantine. She will need 24 hour supervision on discharge as per PT.  Toc on board.  - in view of declining cognition and generalized weakness, poor progression. Palliative care consulted and recommendations given.  She will benefit from outpatient palliative services on discharge.           Estimated body mass index is 21.12 kg/m as calculated from the following:   Height as of this encounter: 5\' 4"  (1.626 m).   Weight as of this encounter: 55.8 kg.  Code Status: full code.  DVT Prophylaxis:  enoxaparin (LOVENOX) injection 30 mg Start: 06/25/23 2200 SCDs Start: 06/25/23 1124   Level of Care: Level of care: Telemetry Medical Family Communication: none at bedside today.   Consultants:   Surgery.  Palliative care services.   Antimicrobials:   Anti-infectives (From admission, onward)  Start     Dose/Rate Route Frequency Ordered Stop   06/27/23 2200  amoxicillin-clavulanate (AUGMENTIN) 500-125 MG per tablet 1 tablet        1 tablet Oral Daily at bedtime 06/27/23 1455     06/27/23 0000  amoxicillin-clavulanate (AUGMENTIN) 875-125 MG tablet  Status:  Discontinued        1 tablet Oral 2 times daily 06/27/23 1454 06/27/23    06/27/23 0000  amoxicillin-clavulanate (AUGMENTIN) 500-125 MG tablet        1 tablet Oral Daily at bedtime 06/27/23 1528 07/10/23 2359   06/25/23 1030  piperacillin-tazobactam (ZOSYN) IVPB 2.25 g  Status:  Discontinued        2.25 g 100 mL/hr over 30 Minutes Intravenous Every 8 hours 06/25/23 0945 06/27/23 1455   06/25/23 0200  piperacillin-tazobactam (ZOSYN) IVPB 3.375 g        3.375 g 100 mL/hr over 30 Minutes Intravenous  Once 06/25/23 0158 06/25/23 0247        Medications  Scheduled Meds:  amoxicillin-clavulanate  1 tablet Oral QHS   aspirin  EC  81 mg Oral QHS   atorvastatin  40 mg Oral Daily   cholecalciferol  1,000 Units Oral QHS   donepezil  10 mg Oral QHS   enoxaparin (LOVENOX) injection  30 mg Subcutaneous Q24H   febuxostat  40 mg Oral QHS   isosorbide dinitrate  20 mg Oral TID   loratadine  10 mg Oral Daily   memantine  10 mg Oral BID   metoprolol succinate  50 mg Oral Daily   multivitamin with minerals  0.5 tablet Oral QHS   pantoprazole  40 mg Oral q morning   QUEtiapine  25 mg Oral QHS   sodium chloride flush  3 mL Intravenous Q12H   Continuous Infusions:  lactated ringers Stopped (06/25/23 1454)   PRN Meds:.acetaminophen **OR** acetaminophen, bisacodyl, hydrALAZINE, HYDROcodone-acetaminophen, ipratropium-albuterol, morphine injection, nitroGLYCERIN, ondansetron **OR** ondansetron (ZOFRAN) IV, mouth rinse, senna-docusate, traZODone    Subjective:   Betty [name] was seen and examined today.  No new complaints.   Objective:   Vitals:   06/30/23 1915 07/01/23 0527 07/01/23 0936 07/01/23 1300  BP: 117/65 97/84 118/82 (!) 110/56  Pulse: 60 60 61 (!) 59  Resp: 19 17  18   Temp: 99.1 F (37.3 C) 97.6 F (36.4 C)  98.3 F (36.8 C)  TempSrc: Oral Oral  Oral  SpO2: 100% 100%  100%  Weight:      Height:        Intake/Output Summary (Last 24 hours) at 07/01/2023 1724 Last data filed at 07/01/2023 1305 Gross per 24 hour  Intake 285 ml  Output --  Net 285 ml    Filed Weights   06/24/23 1413  Weight: 55.8 kg     Exam General exam: Appears calm and comfortable  Respiratory system: Clear to auscultation. Respiratory effort normal. Cardiovascular system: S1 & S2 heard, RRR.  Gastrointestinal system: Abdomen is nondistended, soft and nontender. Central nervous system: Alert and oriented. No focal neurological deficits. Extremities: Symmetric 5 x 5 power. Skin: No rashes,  Psychiatry: Mood & affect appropriate.         Data Reviewed:  I have personally reviewed following labs and imaging  studies   CBC Lab Results  Component Value Date   WBC 8.2 06/26/2023   RBC 3.33 (L) 06/26/2023   HGB 10.2 (L) 06/26/2023   HCT 30.9 (L) 06/26/2023   MCV 92.8 06/26/2023   MCH 30.6 06/26/2023  PLT 333 06/26/2023   MCHC 33.0 06/26/2023   RDW 12.8 06/26/2023   LYMPHSABS 1.2 06/04/2023   MONOABS 0.7 06/04/2023   EOSABS 0.0 06/04/2023   BASOSABS 0.0 06/04/2023     Last metabolic panel Lab Results  Component Value Date   NA 142 06/29/2023   K 3.8 06/29/2023   CL 109 06/29/2023   CO2 25 06/29/2023   BUN 35 (H) 06/29/2023   CREATININE 3.81 (H) 06/29/2023   GLUCOSE 103 (H) 06/29/2023   GFRNONAA 12 (L) 06/29/2023   GFRAA 23 (L) 08/11/2019   CALCIUM 9.1 06/29/2023   PHOS 3.0 07/02/2019   PROT 6.6 06/26/2023   ALBUMIN 2.9 (L) 06/26/2023   BILITOT 0.5 06/26/2023   ALKPHOS 73 06/26/2023   AST 19 06/26/2023   ALT 13 06/26/2023   ANIONGAP 8 06/29/2023    CBG (last 3)  No results for input(s): "GLUCAP" in the last 72 hours.    Coagulation Profile: No results for input(s): "INR", "PROTIME" in the last 168 hours.   Radiology Studies: No results found.     Kathlen Mody M.D. [facility] 07/01/2023, 5:24 PM  Available via Epic secure chat 7am-7pm After 7 pm, please refer to night coverage provider listed on amion.

## 2023-07-01 NOTE — TOC Progression Note (Signed)
Transition of Care [facility]) - Progression Note    Patient Details  Name: CHELSAY SALCIDO MRN: [id number] Date of Birth: January 27, 1946  Transition [facility] Care [facility]) CM/SW Contact  Carley Hammed, LCSW Phone Number: 07/01/2023, 12:20 PM  Clinical Narrative:    CSW spoke with [facility] who noted they will have a bed for pt tomorrow. Authorization is pending. Medical team updated and VM left for son [name]. TOC will continue to follow for DC needs.    Expected Discharge Plan:  (await PT eval) Barriers to Discharge: Continued Medical Work up  Expected Discharge Plan and Services   Discharge Planning Services: CM Consult   Living arrangements for the past 2 months: Single Family Home Expected Discharge Date: 06/27/23               DME Arranged:  (await PT eval)         HH Arranged: PT, OT HH Agency: [facility] Date [facility] Agency Contacted: 06/27/23 Time HH Agency Contacted: [phone] Representative spoke with at [facility] Agency: Kandee Keen   Social Determinants of Health (SDOH) Interventions SDOH Screenings   Food Insecurity: No Food Insecurity (06/25/2023)  Housing: Low Risk  (06/25/2023)  Transportation Needs: No Transportation Needs (06/25/2023)  Utilities: Not At Risk (06/25/2023)  Alcohol Screen: Low Risk  (11/28/2021)  Depression (PHQ2-9): Low Risk  (11/28/2021)  Financial Resource Strain: Low Risk  (11/28/2021)  Physical Activity: Inactive (11/28/2021)  Social Connections: Unknown (11/28/2021)  Stress: No Stress Concern Present (11/28/2021)  Tobacco Use: Medium Risk (06/28/2023)    Readmission Risk Interventions     No data to display

## 2023-07-01 NOTE — Progress Notes (Signed)
Mobility Specialist: Progress Note   07/01/23 1234  Mobility  Activity Ambulated with assistance in hallway  Level of Assistance Standby assist, set-up cues, supervision of patient - no hands on  Assistive Device Other (Comment) (HHA)  Distance Ambulated (ft) 120 ft  Activity Response Tolerated well  Mobility Referral Yes  $Mobility charge 1 Mobility  Mobility Specialist Start Time (ACUTE ONLY) 1210  Mobility Specialist Stop Time (ACUTE ONLY) 1232  Mobility Specialist Time Calculation (min) (ACUTE ONLY) 22 min    Pt was agreeable to mobility session - received in bed. Had a BM and void in bed; MS assisted with bed and brief change. Pt was able to complete peri care independently. HHA for STS and ambulation; observed unsteadiness with gait but no LOB. Had complaints of feeling weak and stiff today; denies any pain, dizziness, or SOB. Took 1x seated break (~5 min). Returned to bed without fault. Left in bed with all needs met, call bell in reach. Bed alarm on.  Maurene Capes Mobility Specialist Please contact via SecureChat or Rehab office at [phone]

## 2023-07-02 DIAGNOSIS — K3532 Acute appendicitis with perforation and localized peritonitis, without abscess: Secondary | ICD-10-CM | POA: Diagnosis not present

## 2023-07-02 DIAGNOSIS — I10 Essential (primary) hypertension: Secondary | ICD-10-CM | POA: Diagnosis not present

## 2023-07-02 NOTE — TOC Progression Note (Signed)
Transition of Care [facility]) - Progression Note    Patient Details  Name: Betty [name] MRN: [id number] Date of Birth: Dec 23, 1945  Transition of Care [facility]) CM/SW Contact  Carley Hammed, LCSW Phone Number: 07/02/2023, 2:17 PM  Clinical Narrative:     Authorization continues to be pending. CSW will continue to follow for any additional authorization needs. Facility, family, and medical team notified of barriers.   Expected Discharge Plan:  (await PT eval) Barriers to Discharge: Continued Medical Work up  Expected Discharge Plan and Services   Discharge Planning Services: CM Consult   Living arrangements for the past 2 months: Single Family Home Expected Discharge Date: 06/27/23               DME Arranged:  (await PT eval)         HH Arranged: PT, OT HH Agency: [facility] Date [facility] Agency Contacted: 06/27/23 Time HH Agency Contacted: [phone] Representative spoke with at [facility] Agency: Kandee Keen   Social Determinants of Health (SDOH) Interventions SDOH Screenings   Food Insecurity: No Food Insecurity (06/25/2023)  Housing: Low Risk  (06/25/2023)  Transportation Needs: No Transportation Needs (06/25/2023)  Utilities: Not At Risk (06/25/2023)  Alcohol Screen: Low Risk  (11/28/2021)  Depression (PHQ2-9): Low Risk  (11/28/2021)  Financial Resource Strain: Low Risk  (11/28/2021)  Physical Activity: Inactive (11/28/2021)  Social Connections: Unknown (11/28/2021)  Stress: No Stress Concern Present (11/28/2021)  Tobacco Use: Medium Risk (06/28/2023)    Readmission Risk Interventions     No data to display

## 2023-07-02 NOTE — Progress Notes (Signed)
[facility]                                                                               Betty [name], is a 77 y.o. female, DOB - Dec 07, 1945, WUJ:[id number] Admit date - 06/24/2023    Outpatient Primary MD for the patient is [place], [place], [place]  LOS - 7  days    Brief summary   Betty [name] is a 77 y.o. female with a known history of acute combined systolic and diastolic heart failure, CKD stage IV, COPD, dementia, GERD, hypertension, hyperlipidemia, non-small cell carcinoma of the lung, CAD status post MI, pacemaker, presents to the emergency department for evaluation of shortness of breath.  Patient son found her today with unsteady gait, shortness of breath, dyspnea on exertion and confused . She was found to have perforated appendicitis.    Assessment & Plan    Assessment and Plan:    Perforated appendicitis - admitted for IV antibiotics and IV fluids. Transitioned to oral augmentin to complete the course.  - further management as per general surgery.  - Pain control - initial  plan was to repeat CT in 3 to 5 days, but her abd pain has improved. Unfortunately she  cannot have MRI due to pacemaker.  She remains asymptomatic, no nausea or vomiting or abd pain.  She is tolerating diet .   C diff pCR is negative.  General surgery recommends a total of 14 days of antibiotics. Last day day antibiotics are 07/09/2023 She will need follow up with GI for a colonoscopy in 4 to 6 weeks.     #. Acute kidney injury on CKD 4 Hydrated and repeat renal parameters show stable creatinine at 3.8.  Further lab work ordered , but patient is refusing further lab work.       Possible urinary tract infection - Transitioned  to augmentin.     New pulmonary nodules in the setting of non-small cell lung cancer -Will need follow-up with oncology for further workup if desired by family   Chronic combined systolic and diastolic heart failure - Continue metoprolol, atorvastatin,  isosorbide dinitrate, holding hydralazine for softer BP parameters.    History of CAD status post MI - Continue atorvastatin, nitroglycerin, aspirin    History of COPD  No wheezing heard.  She remains on RA and denies any sob .     History of GERD Stable.     History of dementia -Continue donepezil and memantine. She will need 24 hour supervision on discharge as per PT.  Toc on board.  - in view of declining cognition and generalized weakness, poor progression. Palliative care consulted and recommendations given.  She will benefit from outpatient palliative services on discharge.   - currently waiting for insurance authorization.      Estimated body mass index is 21.12 kg/m as calculated from the following:   Height as of this encounter: 5\' 4"  (1.626 m).   Weight as of this encounter: 55.8 kg.  Code Status: full code.  DVT Prophylaxis:  enoxaparin (LOVENOX) injection 30 mg Start: 06/25/23 2200 SCDs Start: 06/25/23 1124   Level of Care: Level of care: Telemetry Medical Family Communication:  none at bedside today.   Consultants:   Surgery.  Palliative care services.   Antimicrobials:   Anti-infectives (From admission, onward)    Start     Dose/Rate Route Frequency Ordered Stop   06/27/23 2200  amoxicillin-clavulanate (AUGMENTIN) 500-125 MG per tablet 1 tablet        1 tablet Oral Daily at bedtime 06/27/23 1455     06/27/23 0000  amoxicillin-clavulanate (AUGMENTIN) 875-125 MG tablet  Status:  Discontinued        1 tablet Oral 2 times daily 06/27/23 1454 06/27/23    06/27/23 0000  amoxicillin-clavulanate (AUGMENTIN) 500-125 MG tablet        1 tablet Oral Daily at bedtime 06/27/23 1528 07/10/23 2359   06/25/23 1030  piperacillin-tazobactam (ZOSYN) IVPB 2.25 g  Status:  Discontinued        2.25 g 100 mL/hr over 30 Minutes Intravenous Every 8 hours 06/25/23 0945 06/27/23 1455   06/25/23 0200  piperacillin-tazobactam (ZOSYN) IVPB 3.375 g        3.375 g 100 mL/hr over 30  Minutes Intravenous  Once 06/25/23 0158 06/25/23 0247        Medications  Scheduled Meds:  amoxicillin-clavulanate  1 tablet Oral QHS   aspirin EC  81 mg Oral QHS   atorvastatin  40 mg Oral Daily   cholecalciferol  1,000 Units Oral QHS   donepezil  10 mg Oral QHS   enoxaparin (LOVENOX) injection  30 mg Subcutaneous Q24H   febuxostat  40 mg Oral QHS   isosorbide dinitrate  20 mg Oral TID   loratadine  10 mg Oral Daily   memantine  10 mg Oral BID   metoprolol succinate  50 mg Oral Daily   multivitamin with minerals  0.5 tablet Oral QHS   pantoprazole  40 mg Oral q morning   QUEtiapine  25 mg Oral QHS   sodium chloride flush  3 mL Intravenous Q12H   Continuous Infusions:  lactated ringers Stopped (06/25/23 1454)   PRN Meds:.acetaminophen **OR** acetaminophen, bisacodyl, hydrALAZINE, HYDROcodone-acetaminophen, ipratropium-albuterol, morphine injection, nitroGLYCERIN, ondansetron **OR** ondansetron (ZOFRAN) IV, mouth rinse, senna-docusate, traZODone    Subjective:   Darcel Smalling was seen and examined today.  No new complaints.   Objective:   Vitals:   07/01/23 1300 07/02/23 0606 07/02/23 0929 07/02/23 0935  BP: (!) 110/56 131/69 (!) 89/43 (!) 113/57  Pulse: (!) 59 60 60   Resp: 18  17   Temp: 98.3 F (36.8 C) 97.6 F (36.4 C) 98.1 F (36.7 C)   TempSrc: Oral  Oral   SpO2: 100% 100% 98%   Weight:      Height:        Intake/Output Summary (Last 24 hours) at 07/02/2023 1240 Last data filed at 07/01/2023 1305 Gross per 24 hour  Intake 0 ml  Output --  Net 0 ml    Filed Weights   06/24/23 1413  Weight: 55.8 kg     Exam General exam: Appears calm and comfortable  Respiratory system: Clear to auscultation. Respiratory effort normal. Cardiovascular system: S1 & S2 heard, RRR.  Gastrointestinal system: Abdomen is nondistended, soft and nontender. Central nervous system: Alert and oriented. No focal neurological deficits. Extremities: Symmetric 5 x 5  power. Skin: No rashes,  Psychiatry: Mood & affect appropriate.         Data Reviewed:  I have personally reviewed following labs and imaging studies   CBC Lab Results  Component Value Date   WBC 8.2 06/26/2023  RBC 3.33 (L) 06/26/2023   HGB 10.2 (L) 06/26/2023   HCT 30.9 (L) 06/26/2023   MCV 92.8 06/26/2023   MCH 30.6 06/26/2023   PLT 333 06/26/2023   MCHC 33.0 06/26/2023   RDW 12.8 06/26/2023   LYMPHSABS 1.2 06/04/2023   MONOABS 0.7 06/04/2023   EOSABS 0.0 06/04/2023   BASOSABS 0.0 06/04/2023     Last metabolic panel Lab Results  Component Value Date   NA 142 06/29/2023   K 3.8 06/29/2023   CL 109 06/29/2023   CO2 25 06/29/2023   BUN 35 (H) 06/29/2023   CREATININE 3.81 (H) 06/29/2023   GLUCOSE 103 (H) 06/29/2023   GFRNONAA 12 (L) 06/29/2023   GFRAA 23 (L) 08/11/2019   CALCIUM 9.1 06/29/2023   PHOS 3.0 07/02/2019   PROT 6.6 06/26/2023   ALBUMIN 2.9 (L) 06/26/2023   BILITOT 0.5 06/26/2023   ALKPHOS 73 06/26/2023   AST 19 06/26/2023   ALT 13 06/26/2023   ANIONGAP 8 06/29/2023    CBG (last 3)  No results for input(s): "GLUCAP" in the last 72 hours.    Coagulation Profile: No results for input(s): "INR", "PROTIME" in the last 168 hours.   Radiology Studies: No results found.     Kathlen Mody M.D. [facility] 07/02/2023, 12:40 PM  Available via Epic secure chat 7am-7pm After 7 pm, please refer to night coverage provider listed on amion.

## 2023-07-02 NOTE — Progress Notes (Signed)
Pt refused morning labs. Akula,MD made aware.

## 2023-07-03 DIAGNOSIS — K3532 Acute appendicitis with perforation and localized peritonitis, without abscess: Secondary | ICD-10-CM | POA: Diagnosis not present

## 2023-07-03 NOTE — Progress Notes (Signed)
Physical Therapy Treatment Patient Details Name: Betty [name] MRN: [id number] DOB: 1946/03/17 Today's Date: 07/03/2023   History of Present Illness Pt is 77 yo female who presents on 06/24/23 with shortness of breathe. Found to have perforated appendicitis and currently receiving non-operative management.  PMH: dementia, CKD4, CHF, COPD, NSC lung cancer s/p lobectomy, NSTEMI, HTN, HLD, GERD    PT Comments  Pt seen for PT tx with pt agreeable. Pt confused throughout session, oriented to name but providing 3 different birth years, unable to report her age, only oriented to hospital when given choice of 2. Pt also incontinent of urine with bed soaked & pt unaware but then stating "I am a little damp". Pt is able to ambulate in room, hallway with HHA fade to no UE support with min assist throughout, 1 episode of LOB with assistance to correct. Pt presents with impaired gait pattern as noted below that puts her at increased risk of falling.  Pt is unsafe to d/c home alone 2/2 cognitive & physical deficits & would benefit from ongoing acute PT services to address balance, strength, & gait with LRAD.   If plan is discharge home, recommend the following: A little help with walking and/or transfers;Assistance with cooking/housework;Assist for transportation;Help with stairs or ramp for entrance;A little help with bathing/dressing/bathroom   Can travel by private vehicle     Yes  Equipment Recommendations  None recommended by PT (defer to next venue)    Recommendations for Other Services       Precautions / Restrictions Precautions Precautions: Fall Restrictions Weight Bearing Restrictions: No     Mobility  Bed Mobility   Bed Mobility: Supine to Sit     Supine to sit: Modified independent (Device/Increase time) (extra time)          Transfers Overall transfer level: Needs assistance Equipment used: 1 person hand held assist Transfers: Sit to/from Stand Sit to Stand: Min assist            General transfer comment: STS from EOB, low bench in hallway, couch in room with 1UE HHA min assist    Ambulation/Gait Ambulation/Gait assistance: Min assist Gait Distance (Feet): 70 Feet (+ 70 ft + 180 ft) Assistive device: 1 person hand held assist, None Gait Pattern/deviations: Decreased step length - right, Decreased step length - left, Decreased dorsiflexion - right, Decreased dorsiflexion - left, Decreased stride length, Shuffle Gait velocity: significantly decreased     General Gait Details: Pt ambulates initially with HHA as pt wanting to hold to something for support. Pt is able to progress to gait without UE support but requires min assist overall. Pt demonstrates decreased step length, decreased stride length, absent heel strike, decreased dorsiflexion. PT provides ongoing cuing re: increased step length with pt eventually stating "you're getting what I can give".   Stairs             Wheelchair Mobility     Tilt Bed    Modified Rankin (Stroke Patients Only)       Balance Overall balance assessment: Needs assistance Sitting-balance support: Feet unsupported, No upper extremity supported Sitting balance-Leahy Scale: Good Sitting balance - Comments: supervision static sitting EOB   Standing balance support: No upper extremity supported, During functional activity Standing balance-Leahy Scale: Poor                              Cognition Arousal: Alert Behavior During Therapy: WFL for tasks assessed/performed  Overall Cognitive Status: No family/caregiver present to determine baseline cognitive functioning Area of Impairment: Orientation, Memory, Following commands, Safety/judgement, Attention, Awareness, Problem solving                 Orientation Level: Disoriented to, Person, Place, Time, Situation (Pt oriented to name but is unable to recall her age & gives 3 different birth years when telling PT when she was born. Pt is oriented to  hospital when given choice of 2 but unable to state which one.)   Memory: Decreased short-term memory Following Commands: Follows one step commands inconsistently, Follows one step commands with increased time Safety/Judgement: Decreased awareness of safety, Decreased awareness of deficits Awareness: Intellectual Problem Solving: Slow processing, Decreased initiation, Requires verbal cues, Requires tactile cues, Difficulty sequencing          Exercises      General Comments General comments (skin integrity, edema, etc.): Pt received soiled with urine with pt appearing unaware & when PT points this out, pt states "I am a little damp". PT provides assistance with changing into a clean gown. Pt requires heavy cuing to wash hands standing at sink.      Pertinent Vitals/Pain Pain Assessment Pain Assessment: No/denies pain    Home Living                          Prior Function            PT Goals (current goals can now be found in the care plan section) Acute Rehab PT Goals Patient Stated Goal: to go home with son Betty [name] PT Goal Formulation: With patient Time For Goal Achievement: 07/11/23 Potential to Achieve Goals: Fair Progress towards PT goals: Progressing toward goals    Frequency    Min 1X/week      PT Plan      Co-evaluation              AM-PAC PT "6 Clicks" Mobility   Outcome Measure  Help needed turning from your back to your side while in a flat bed without using bedrails?: None Help needed moving from lying on your back to sitting on the side of a flat bed without using bedrails?: None Help needed moving to and from a bed to a chair (including a wheelchair)?: A Little Help needed standing up from a chair using your arms (e.g., wheelchair or bedside chair)?: A Little Help needed to walk in hospital room?: A Little Help needed climbing 3-5 steps with a railing? : A Little 6 Click Score: 20    End of Session   Activity Tolerance: Patient  tolerated treatment well Patient left: in chair;with chair alarm set;with call bell/phone within reach Nurse Communication: Mobility status PT Visit Diagnosis: Unsteadiness on feet (R26.81);Difficulty in walking, not elsewhere classified (R26.2);Muscle weakness (generalized) (M62.81)     Time: 1610-9604 PT Time Calculation (min) (ACUTE ONLY): 23 min  Charges:    $Gait Training: 8-22 mins $Therapeutic Activity: 8-22 mins PT General Charges $$ ACUTE PT VISIT: 1 Visit                     Aleda Grana, PT, DPT 07/03/23, 1:58 PM   Sandi Mariscal 07/03/2023, 1:56 PM

## 2023-07-03 NOTE — TOC Progression Note (Signed)
Transition of Care [facility]) - Progression Note    Patient Details  Name: Betty [name] MRN: [id number] Date of Birth: 1946/10/30  Transition of Care [facility]) CM/SW Contact  Carley Hammed, LCSW Phone Number: 07/03/2023, 11:26 AM  Clinical Narrative:     CSW was advised by insurance that a peer to peer was requested. MD completed and authorization was denied. Insurance provided fast appeal # [phone]. CSW spoke with pt's son who stated he would appeal since pt had no one at home. CSW advised of medical appropriateness and memory care, son plans to appeal. Medical team and facility updated. TOC will continue to follow for DC needs.   Expected Discharge Plan:  (await PT eval) Barriers to Discharge: Continued Medical Work up  Expected Discharge Plan and Services   Discharge Planning Services: CM Consult   Living arrangements for the past 2 months: Single Family Home Expected Discharge Date: 06/27/23               DME Arranged:  (await PT eval)         HH Arranged: PT, OT HH [facility]: [facility] Date [facility] Agency Contacted: 06/27/23 Time HH Agency Contacted: [id number] Representative spoke with at [facility] Agency: Kandee Keen   Social Determinants of Health (SDOH) Interventions SDOH Screenings   Food Insecurity: No Food Insecurity (06/25/2023)  Housing: Low Risk  (06/25/2023)  Transportation Needs: No Transportation Needs (06/25/2023)  Utilities: Not At Risk (06/25/2023)  Alcohol Screen: Low Risk  (11/28/2021)  Depression (PHQ2-9): Low Risk  (11/28/2021)  Financial Resource Strain: Low Risk  (11/28/2021)  Physical Activity: Inactive (11/28/2021)  Social Connections: Unknown (11/28/2021)  Stress: No Stress Concern Present (11/28/2021)  Tobacco Use: Medium Risk (06/28/2023)    Readmission Risk Interventions     No data to display

## 2023-07-03 NOTE — Progress Notes (Signed)
PROGRESS NOTE  Betty [name]:[id number] DOB: May 09, 1946 DOA: 06/24/2023 PCP: Hyacinth Meeker, [place], [place]   LOS: 8 days   Brief Narrative / Interim history: 77 y.o. female with a known history of acute combined systolic and diastolic heart failure, CKD stage IV, COPD, dementia, GERD, hypertension, hyperlipidemia, non-small cell carcinoma of the lung, CAD status post MI, pacemaker, presents to the emergency department for evaluation of shortness of breath. Patient son found her today with unsteady gait, shortness of breath, dyspnea on exertion and confused . She was found to have perforated appendicitis.   Subjective / 24h Interval events: Doing well, she has no complaints.  Specifically, denies any abdominal pain, no nausea or vomiting.  Eating well  Assesement and Plan: Principal problem Perforated appendicitis - admitted for IV antibiotics and IV fluids. Transitioned to oral augmentin to complete a 14-day course, currently at day #8, last day 9/3 - further management as per general surgery.  She remains asymptomatic, tolerating a regular diet.  C. difficile was checked and it was negative -She will need follow up with GI for a colonoscopy in 4 to 6 weeks.    Active problems Acute kidney injury on CKD 4 - Hydrated and repeat renal parameters show stable creatinine at 3.8. Further lab work ordered , but patient is refusing further lab work.    Possible urinary tract infection - Transitioned  to augmentin.    New pulmonary nodules in the setting of non-small cell lung cancer - Will need follow-up with oncology for further workup if desired by family   Chronic combined systolic and diastolic heart failure - Continue metoprolol, atorvastatin, isosorbide dinitrate, holding hydralazine for softer BP parameters.    History of CAD with prior MI - Continue atorvastatin, nitroglycerin, aspirin   History of COPD -respiratory status stable, on room air, no wheezing  History of GERD - Stable    History of dementia - Continue donepezil and memantine.  Needs SNF however insurance refused, family to appeal  Scheduled Meds:  amoxicillin-clavulanate  1 tablet Oral QHS   aspirin EC  81 mg Oral QHS   atorvastatin  40 mg Oral Daily   cholecalciferol  1,000 Units Oral QHS   donepezil  10 mg Oral QHS   enoxaparin (LOVENOX) injection  30 mg Subcutaneous Q24H   febuxostat  40 mg Oral QHS   isosorbide dinitrate  20 mg Oral TID   loratadine  10 mg Oral Daily   memantine  10 mg Oral BID   metoprolol succinate  50 mg Oral Daily   multivitamin with minerals  0.5 tablet Oral QHS   pantoprazole  40 mg Oral q morning   QUEtiapine  25 mg Oral QHS   sodium chloride flush  3 mL Intravenous Q12H   Continuous Infusions:  lactated ringers Stopped (06/25/23 1454)   PRN Meds:.acetaminophen **OR** acetaminophen, bisacodyl, hydrALAZINE, HYDROcodone-acetaminophen, ipratropium-albuterol, morphine injection, nitroGLYCERIN, ondansetron **OR** ondansetron (ZOFRAN) IV, mouth rinse, senna-docusate, traZODone  Current Outpatient Medications  Medication Instructions   acetaminophen (TYLENOL) 650 mg, Oral, Every 6 hours PRN   amoxicillin-clavulanate (AUGMENTIN) 500-125 MG tablet 1 tablet, Oral, Daily at bedtime   aspirin EC 81 mg, Oral, Daily at bedtime   atorvastatin (LIPITOR) 40 MG tablet TAKE 1 TABLET BY MOUTH EVERY DAY AT 6 PM   cetirizine (ZYRTEC) 10 mg, Oral, Daily   donepezil (ARICEPT) 10 mg, Oral, Daily at bedtime   febuxostat (ULORIC) 40 mg, Oral, Daily at bedtime   hydrALAZINE (APRESOLINE) 50 mg, Oral, 3 times  daily   isosorbide dinitrate (ISORDIL) 20 mg, Oral, 3 times daily   memantine (NAMENDA) 10 mg, Oral, 2 times daily   metoprolol succinate (TOPROL-XL) 50 mg, Oral, Daily, Take with or immediately following a meal.   Multiple Vitamin (MULTIVITAMIN WITH MINERALS) TABS tablet 0.5 tablets, Oral, Daily at bedtime   nitroGLYCERIN (NITROSTAT) 0.4 mg, Sublingual, Every 5 min x3 PRN   pantoprazole  (PROTONIX) 40 mg, Oral, Every morning   QUEtiapine (SEROQUEL) 25 mg, Oral, Daily at bedtime   sodium bicarbonate 650 mg, Oral, 2 times daily   Vitamin D 1,000 Units, Oral, Daily at bedtime    Diet Orders (From admission, onward)     Start     Ordered   06/27/23 0000  Diet - low sodium heart healthy        06/27/23 1454   06/27/23 0000  Diet - low sodium heart healthy        06/27/23 1528   06/25/23 1124  Diet Heart Room service appropriate? Yes; Fluid consistency: Thin  Diet effective now       Question Answer Comment  Room service appropriate? Yes   Fluid consistency: Thin      06/25/23 1123            DVT prophylaxis: enoxaparin (LOVENOX) injection 30 mg Start: 06/25/23 2200 SCDs Start: 06/25/23 1124   Lab Results  Component Value Date   PLT 333 06/26/2023      Code Status: DNR  Family Communication: no family at bedside  Status is: Inpatient  Remains inpatient appropriate because: awaiting placement  Level of care: Telemetry Medical  Consultants:  Surgery   Objective: Vitals:   07/02/23 1605 07/02/23 2047 07/03/23 0547 07/03/23 0740  BP: (!) 99/55 (!) 117/55 118/67 138/68  Pulse: (!) 59 (!) 59 60 61  Resp: 16   16  Temp: 98 F (36.7 C) 97.7 F (36.5 C) 97.6 F (36.4 C) 97.7 F (36.5 C)  TempSrc: Oral Oral  Oral  SpO2: 100% 100% 100% 100%  Weight:      Height:        Intake/Output Summary (Last 24 hours) at 07/03/2023 1156 Last data filed at 07/02/2023 1359 Gross per 24 hour  Intake 240 ml  Output --  Net 240 ml   Wt Readings from Last 3 Encounters:  06/24/23 55.8 kg  06/03/23 55.8 kg  05/06/23 55.8 kg    Examination:  Constitutional: NAD Eyes: no scleral icterus ENMT: Mucous membranes are moist.  Neck: normal, supple Respiratory: clear to auscultation bilaterally, no wheezing, no crackles. Normal respiratory effort. No accessory muscle use.  Cardiovascular: Regular rate and rhythm, no murmurs / rubs / gallops. No LE edema.   Abdomen: non distended, no tenderness. Bowel sounds positive.  Musculoskeletal: no clubbing / cyanosis.   Data Reviewed: I have independently reviewed following labs and imaging studies   CBC Recent Labs  Lab 06/26/23 1605  WBC 8.2  HGB 10.2*  HCT 30.9*  PLT 333  MCV 92.8  MCH 30.6  MCHC 33.0  RDW 12.8    Recent Labs  Lab 06/26/23 1605 06/29/23 1058  NA 140 142  K 4.4 3.8  CL 107 109  CO2 22 25  GLUCOSE 114* 103*  BUN 38* 35*  CREATININE 3.84* 3.81*  CALCIUM 8.4* 9.1  AST 19  --   ALT 13  --   ALKPHOS 73  --   BILITOT 0.5  --   ALBUMIN 2.9*  --     ------------------------------------------------------------------------------------------------------------------  No results for input(s): "CHOL", "HDL", "LDLCALC", "TRIG", "CHOLHDL", "LDLDIRECT" in the last 72 hours.  Lab Results  Component Value Date   HGBA1C 6.9 (H) 05/16/2019   ------------------------------------------------------------------------------------------------------------------ No results for input(s): "TSH", "T4TOTAL", "T3FREE", "THYROIDAB" in the last 72 hours.  Invalid input(s): "FREET3"  Cardiac Enzymes No results for input(s): "CKMB", "TROPONINI", "MYOGLOBIN" in the last 168 hours.  Invalid input(s): "CK" ------------------------------------------------------------------------------------------------------------------    Component Value Date/Time   BNP 80.7 06/09/2019 0355    CBG: No results for input(s): "GLUCAP" in the last 168 hours.  Recent Results (from the past 240 hour(s))  Blood culture (routine x 2)     Status: None   Collection Time: 06/25/23  2:12 AM   Specimen: BLOOD RIGHT ARM  Result Value Ref Range Status   Specimen Description BLOOD RIGHT ARM  Final   Special Requests   Final    BOTTLES DRAWN AEROBIC AND ANAEROBIC Blood Culture adequate volume   Culture   Final    NO GROWTH 5 DAYS Performed at [facility], [address] [place], [place] [address]     Report Status 06/30/2023 FINAL  Final  Blood culture (routine x 2)     Status: None   Collection Time: 06/25/23  2:14 AM   Specimen: BLOOD RIGHT HAND  Result Value Ref Range Status   Specimen Description BLOOD RIGHT HAND  Final   Special Requests   Final    BOTTLES DRAWN AEROBIC AND ANAEROBIC Blood Culture adequate volume   Culture   Final    NO GROWTH 5 DAYS Performed at [facility], [address], [place], [place] [address]    Report Status 06/30/2023 FINAL  Final  C Difficile Quick Screen w PCR reflex     Status: None   Collection Time: 06/25/23 10:07 AM   Specimen: STOOL  Result Value Ref Range Status   C Diff antigen NEGATIVE NEGATIVE Final   C Diff toxin NEGATIVE NEGATIVE Final   C Diff interpretation No C. difficile detected.  Final    Comment: Performed at [facility], [address] [place], [place] [address]     Radiology Studies: No results found.   Pamella Pert, MD, PhD [facility]  Between 7 am - 7 pm I am available, please contact me via Amion (for emergencies) or Securechat (non urgent messages)  Between 7 pm - 7 am I am not available, please contact night coverage MD/APP via Amion

## 2023-07-03 NOTE — Plan of Care (Signed)

## 2023-07-04 DIAGNOSIS — K3532 Acute appendicitis with perforation and localized peritonitis, without abscess: Secondary | ICD-10-CM | POA: Diagnosis not present

## 2023-07-04 LAB — COMPREHENSIVE METABOLIC PANEL
ALT: 12 U/L (ref 0–44)
AST: 17 U/L (ref 15–41)
Albumin: 3.2 g/dL — ABNORMAL LOW (ref 3.5–5.0)
Alkaline Phosphatase: 70 U/L (ref 38–126)
Anion gap: 12 (ref 5–15)
BUN: 58 mg/dL — ABNORMAL HIGH (ref 8–23)
CO2: 21 mmol/L — ABNORMAL LOW (ref 22–32)
Calcium: 9.5 mg/dL (ref 8.9–10.3)
Chloride: 106 mmol/L (ref 98–111)
Creatinine, Ser: 4.26 mg/dL — ABNORMAL HIGH (ref 0.44–1.00)
GFR, Estimated: 10 mL/min — ABNORMAL LOW (ref >60)
Glucose, Bld: 73 mg/dL (ref 70–99)
Potassium: 4.4 mmol/L (ref 3.5–5.1)
Sodium: 139 mmol/L (ref 135–145)
Total Bilirubin: 0.7 mg/dL (ref 0.3–1.2)
Total Protein: 6.7 g/dL (ref 6.5–8.1)

## 2023-07-04 LAB — MAGNESIUM: Magnesium: 2.3 mg/dL (ref 1.7–2.4)

## 2023-07-04 LAB — CBC
HCT: 31 % — ABNORMAL LOW (ref 36.0–46.0)
Hemoglobin: 10.3 g/dL — ABNORMAL LOW (ref 12.0–15.0)
MCH: 30 pg (ref 26.0–34.0)
MCHC: 33.2 g/dL (ref 30.0–36.0)
MCV: 90.4 fL (ref 80.0–100.0)
Platelets: 303 10*3/uL (ref 150–400)
RBC: 3.43 MIL/uL — ABNORMAL LOW (ref 3.87–5.11)
RDW: 13 % (ref 11.5–15.5)
WBC: 8.4 10*3/uL (ref 4.0–10.5)
nRBC: 0 % (ref 0.0–0.2)

## 2023-07-04 LAB — PHOSPHORUS: Phosphorus: 4.9 mg/dL — ABNORMAL HIGH (ref 2.5–4.6)

## 2023-07-04 MED ORDER — SODIUM CHLORIDE 0.9 % IV BOLUS
1000.0000 mL | Freq: Once | INTRAVENOUS | Status: AC
  Administered 2023-07-04: 1000 mL via INTRAVENOUS

## 2023-07-04 NOTE — Plan of Care (Signed)
Care plan ongoing Annalee Genta, RN  07/04/2023 3:32 PM

## 2023-07-04 NOTE — Care Management Important Message (Signed)
Important Message  Patient Details  Name: VELOURIA GUPPY MRN: [id number] Date of Birth: 11-Sep-1946   Medicare Important Message Given:  Yes     Sherilyn Banker 07/04/2023, 1:39 PM

## 2023-07-04 NOTE — Progress Notes (Signed)
Mobility Specialist: Progress Note   07/04/23 1532  Mobility  Activity Ambulated with assistance in hallway  Level of Assistance Contact guard assist, steadying assist  Assistive Device Front wheel walker  Distance Ambulated (ft) 90 ft  Activity Response Tolerated well  Mobility Referral Yes  $Mobility charge 1 Mobility  Mobility Specialist Start Time (ACUTE ONLY) 1412  Mobility Specialist Stop Time (ACUTE ONLY) 1437  Mobility Specialist Time Calculation (min) (ACUTE ONLY) 25 min    Pt was agreeable to mobility session - received in bed. Had void in bed; MS assisted with bed change. Pt was able to complete peri care independently. MinA for STS, CG for ambulation; observed unsteadiness with gait but no LOB. Returned to bed without fault. Left in bed with all needs met, call bell in reach. Bed alarm on.   Maurene Capes Mobility Specialist Please contact via SecureChat or Rehab office at [phone]

## 2023-07-04 NOTE — Plan of Care (Signed)
  Problem: Clinical Measurements: Goal: Ability to maintain clinical measurements within normal limits will improve Outcome: Progressing Goal: Will remain free from infection Outcome: Progressing Goal: Diagnostic test results will improve Outcome: Progressing Goal: Respiratory complications will improve Outcome: Progressing Goal: Cardiovascular complication will be avoided Outcome: Progressing   Problem: Activity: Goal: Risk for activity intolerance will decrease Outcome: Progressing   Problem: Nutrition: Goal: Adequate nutrition will be maintained Outcome: Progressing   Problem: Coping: Goal: Level of anxiety will decrease Outcome: Progressing   Problem: Elimination: Goal: Will not experience complications related to bowel motility Outcome: Progressing   Problem: Pain Managment: Goal: General experience of comfort will improve Outcome: Progressing   Problem: Safety: Goal: Ability to remain free from injury will improve Outcome: Progressing   Problem: Skin Integrity: Goal: Risk for impaired skin integrity will decrease Outcome: Progressing   

## 2023-07-04 NOTE — Progress Notes (Signed)
PROGRESS NOTE  Betty [name] ZOX:[id number] DOB: [birth year] DOA: 06/24/2023 PCP: Hyacinth Meeker, [place], [place]   LOS: 9 days   Brief Narrative / Interim history: 77 y.o. female with a known history of acute combined systolic and diastolic heart failure, CKD stage IV, COPD, dementia, GERD, hypertension, hyperlipidemia, non-small cell carcinoma of the lung, CAD status post MI, pacemaker, presents to the emergency department for evaluation of shortness of breath. Patient son found her today with unsteady gait, shortness of breath, dyspnea on exertion and confused . She was found to have perforated appendicitis.   Subjective / 24h Interval events: No complaint.  Eating breakfast.  Assesement and Plan: Principal problem Perforated appendicitis - admitted for IV antibiotics and IV fluids. Transitioned to oral augmentin to complete a 14-day course, currently at day #8, last day 9/3 - further management as per general surgery.  She remains asymptomatic, tolerating a regular diet.  C. difficile was checked and it was negative -She will need follow up with GI for a colonoscopy in 4 to 6 weeks.    Active problems Acute kidney injury on CKD 4 -creatinine slight higher today in the 4 range.  Suspect a degree of poor p.o. intake.  Provide IV fluids today, repeat in the morning   Possible urinary tract infection - Transitioned  to augmentin.    New pulmonary nodules in the setting of non-small cell lung cancer - Will need follow-up with oncology for further workup if desired by family   Chronic combined systolic and diastolic heart failure - Continue metoprolol, atorvastatin, isosorbide dinitrate, holding hydralazine for softer BP parameters.    History of CAD with prior MI - Continue atorvastatin, nitroglycerin, aspirin   History of COPD -respiratory status stable, on room air, no wheezing  History of GERD - Stable   History of dementia - Continue donepezil and memantine.  Needs SNF however insurance  refused, family to appeal  Scheduled Meds:  amoxicillin-clavulanate  1 tablet Oral QHS   aspirin EC  81 mg Oral QHS   atorvastatin  40 mg Oral Daily   cholecalciferol  1,000 Units Oral QHS   donepezil  10 mg Oral QHS   enoxaparin (LOVENOX) injection  30 mg Subcutaneous Q24H   febuxostat  40 mg Oral QHS   isosorbide dinitrate  20 mg Oral TID   loratadine  10 mg Oral Daily   memantine  10 mg Oral BID   metoprolol succinate  50 mg Oral Daily   multivitamin with minerals  0.5 tablet Oral QHS   pantoprazole  40 mg Oral q morning   QUEtiapine  25 mg Oral QHS   sodium chloride flush  3 mL Intravenous Q12H   Continuous Infusions:  lactated ringers Stopped (06/25/23 1454)   sodium chloride     PRN Meds:.acetaminophen **OR** acetaminophen, bisacodyl, hydrALAZINE, HYDROcodone-acetaminophen, ipratropium-albuterol, morphine injection, nitroGLYCERIN, ondansetron **OR** ondansetron (ZOFRAN) IV, mouth rinse, senna-docusate, traZODone  Current Outpatient Medications  Medication Instructions   acetaminophen (TYLENOL) 650 mg, Oral, Every 6 hours PRN   amoxicillin-clavulanate (AUGMENTIN) 500-125 MG tablet 1 tablet, Oral, Daily at bedtime   aspirin EC 81 mg, Oral, Daily at bedtime   atorvastatin (LIPITOR) 40 MG tablet TAKE 1 TABLET BY MOUTH EVERY DAY AT 6 PM   cetirizine (ZYRTEC) 10 mg, Oral, Daily   donepezil (ARICEPT) 10 mg, Oral, Daily at bedtime   febuxostat (ULORIC) 40 mg, Oral, Daily at bedtime   hydrALAZINE (APRESOLINE) 50 mg, Oral, 3 times daily   isosorbide dinitrate (ISORDIL) 20  mg, Oral, 3 times daily   memantine (NAMENDA) 10 mg, Oral, 2 times daily   metoprolol succinate (TOPROL-XL) 50 mg, Oral, Daily, Take with or immediately following a meal.   Multiple Vitamin (MULTIVITAMIN WITH MINERALS) TABS tablet 0.5 tablets, Oral, Daily at bedtime   nitroGLYCERIN (NITROSTAT) 0.4 mg, Sublingual, Every 5 min x3 PRN   pantoprazole (PROTONIX) 40 mg, Oral, Every morning   QUEtiapine (SEROQUEL) 25  mg, Oral, Daily at bedtime   sodium bicarbonate 650 mg, Oral, 2 times daily   Vitamin D 1,000 Units, Oral, Daily at bedtime    Diet Orders (From admission, onward)     Start     Ordered   06/27/23 0000  Diet - low sodium heart healthy        06/27/23 1454   06/27/23 0000  Diet - low sodium heart healthy        06/27/23 1528   06/25/23 1124  Diet Heart Room service appropriate? Yes; Fluid consistency: Thin  Diet effective now       Question Answer Comment  Room service appropriate? Yes   Fluid consistency: Thin      06/25/23 1123            DVT prophylaxis: enoxaparin (LOVENOX) injection 30 mg Start: 06/25/23 2200 SCDs Start: 06/25/23 1124   Lab Results  Component Value Date   PLT 303 07/04/2023      Code Status: DNR  Family Communication: no family at bedside  Status is: Inpatient  Remains inpatient appropriate because: awaiting placement  Level of care: Telemetry Medical  Consultants:  Surgery   Objective: Vitals:   07/03/23 1607 07/03/23 2000 07/04/23 0517 07/04/23 0733  BP: (!) 112/58 (!) 124/58 110/61 119/65  Pulse: 60 62 60 (!) 59  Resp: 16 18 18 17   Temp: 99 F (37.2 C) 98.2 F (36.8 C) 98.4 F (36.9 C) 98.7 F (37.1 C)  TempSrc: Oral Oral Oral Oral  SpO2: 100% 100% 99% 100%  Weight:      Height:        Intake/Output Summary (Last 24 hours) at 07/04/2023 1123 Last data filed at 07/04/2023 1610 Gross per 24 hour  Intake 240 ml  Output --  Net 240 ml   Wt Readings from Last 3 Encounters:  06/24/23 55.8 kg  06/03/23 55.8 kg  05/06/23 55.8 kg    Examination:  Constitutional: NAD Eyes: lids and conjunctivae normal, no scleral icterus ENMT: mmm Neck: normal, supple Respiratory: clear to auscultation bilaterally, no wheezing, no crackles.  Cardiovascular: Regular rate and rhythm, no murmurs / rubs / gallops. No LE edema. Abdomen: soft, no distention, no tenderness. Bowel sounds positive.   Data Reviewed: I have independently reviewed  following labs and imaging studies   CBC Recent Labs  Lab 07/04/23 0543  WBC 8.4  HGB 10.3*  HCT 31.0*  PLT 303  MCV 90.4  MCH 30.0  MCHC 33.2  RDW 13.0    Recent Labs  Lab 06/29/23 1058 07/04/23 0543  NA 142 139  K 3.8 4.4  CL 109 106  CO2 25 21*  GLUCOSE 103* 73  BUN 35* 58*  CREATININE 3.81* 4.26*  CALCIUM 9.1 9.5  AST  --  17  ALT  --  12  ALKPHOS  --  70  BILITOT  --  0.7  ALBUMIN  --  3.2*  MG  --  2.3    ------------------------------------------------------------------------------------------------------------------ No results for input(s): "CHOL", "HDL", "LDLCALC", "TRIG", "CHOLHDL", "LDLDIRECT" in the last 72  hours.  Lab Results  Component Value Date   HGBA1C 6.9 (H) 05/16/2019   ------------------------------------------------------------------------------------------------------------------ No results for input(s): "TSH", "T4TOTAL", "T3FREE", "THYROIDAB" in the last 72 hours.  Invalid input(s): "FREET3"  Cardiac Enzymes No results for input(s): "CKMB", "TROPONINI", "MYOGLOBIN" in the last 168 hours.  Invalid input(s): "CK" ------------------------------------------------------------------------------------------------------------------    Component Value Date/Time   BNP 80.7 06/09/2019 0355    CBG: No results for input(s): "GLUCAP" in the last 168 hours.  Recent Results (from the past 240 hour(s))  Blood culture (routine x 2)     Status: None   Collection Time: 06/25/23  2:12 AM   Specimen: BLOOD RIGHT ARM  Result Value Ref Range Status   Specimen Description BLOOD RIGHT ARM  Final   Special Requests   Final    BOTTLES DRAWN AEROBIC AND ANAEROBIC Blood Culture adequate volume   Culture   Final    NO GROWTH 5 DAYS Performed at [facility], [address] [place], [place] [address]    Report Status 06/30/2023 FINAL  Final  Blood culture (routine x 2)     Status: None   Collection Time: 06/25/23  2:14 AM   Specimen: BLOOD  RIGHT HAND  Result Value Ref Range Status   Specimen Description BLOOD RIGHT HAND  Final   Special Requests   Final    BOTTLES DRAWN AEROBIC AND ANAEROBIC Blood Culture adequate volume   Culture   Final    NO GROWTH 5 DAYS Performed at [facility], [address], [place], [place] [address]    Report Status 06/30/2023 FINAL  Final  C Difficile Quick Screen w PCR reflex     Status: None   Collection Time: 06/25/23 10:07 AM   Specimen: STOOL  Result Value Ref Range Status   C Diff antigen NEGATIVE NEGATIVE Final   C Diff toxin NEGATIVE NEGATIVE Final   C Diff interpretation No C. difficile detected.  Final    Comment: Performed at [facility], [address], [place], [place] [address]     Radiology Studies: No results found.   Pamella Pert, MD, PhD [facility]  Between 7 am - 7 pm I am available, please contact me via Amion (for emergencies) or Securechat (non urgent messages)  Between 7 pm - 7 am I am not available, please contact night coverage MD/APP via Amion

## 2023-07-04 NOTE — Plan of Care (Signed)
Care plan ongoing Annalee Genta, RN  07/04/2023  3:36 PM

## 2023-07-05 DIAGNOSIS — K3532 Acute appendicitis with perforation and localized peritonitis, without abscess: Secondary | ICD-10-CM | POA: Diagnosis not present

## 2023-07-05 LAB — BASIC METABOLIC PANEL
Anion gap: 9 (ref 5–15)
BUN: 56 mg/dL — ABNORMAL HIGH (ref 8–23)
CO2: 21 mmol/L — ABNORMAL LOW (ref 22–32)
Calcium: 9 mg/dL (ref 8.9–10.3)
Chloride: 110 mmol/L (ref 98–111)
Creatinine, Ser: 4.09 mg/dL — ABNORMAL HIGH (ref 0.44–1.00)
GFR, Estimated: 11 mL/min — ABNORMAL LOW (ref >60)
Glucose, Bld: 84 mg/dL (ref 70–99)
Potassium: 4.4 mmol/L (ref 3.5–5.1)
Sodium: 140 mmol/L (ref 135–145)

## 2023-07-05 MED ORDER — SODIUM CHLORIDE 0.9 % IV BOLUS
500.0000 mL | Freq: Once | INTRAVENOUS | Status: AC
  Administered 2023-07-05: 500 mL via INTRAVENOUS

## 2023-07-05 NOTE — Progress Notes (Signed)
Physical Therapy Treatment Patient Details Name: Betty [name] MRN: [id number] DOB: Nov 12, 1945 Today's Date: 07/05/2023   History of Present Illness Pt is 77 yo female who presents on 06/24/23 with shortness of breath. Found to have perforated appendicitis and currently receiving non-operative management. PMH: dementia, CKD4, CHF, COPD, NSC lung cancer s/p lobectomy, NSTEMI, HTN, HLD, and GERD.    PT Comments  The pt was agreeable to session, remains disoriented at this time but able to follow simple commands and instructions. The pt utilized RW this session and was able to progress to CGA at times with walking. Needs increased cues for positioning in RW, speed, and stride length and clearance to maintain safe walking, especially with turning. The pt was able to make good progress on walking distance, but limited by fatigue in progressing further or completing LE strengthening exercises in addition to gait at this time. Will continue to benefit from skilled PT acutely to improve strength, power, and stability to reduce risk of falls and fall-related injury after d/c.     If plan is discharge home, recommend the following: A little help with walking and/or transfers;Assistance with cooking/housework;Assist for transportation;Help with stairs or ramp for entrance;A little help with bathing/dressing/bathroom   Can travel by private vehicle     Yes  Equipment Recommendations  None recommended by PT (defer to post acute)    Recommendations for Other Services       Precautions / Restrictions Precautions Precautions: Fall Precaution Comments: pt with dementia Restrictions Weight Bearing Restrictions: No     Mobility  Bed Mobility               General bed mobility comments: pt OOB in recliner at start and end of session    Transfers Overall transfer level: Needs assistance Equipment used: Rolling walker (2 wheels) Transfers: Sit to/from Stand Sit to Stand: Min assist            General transfer comment: minA to rise and steady, dependent on BUE support and RW. minA as pt impulsive with poor use of RW when mobilizing    Ambulation/Gait Ambulation/Gait assistance: Min assist, Contact guard assist Gait Distance (Feet): 125 Feet Assistive device: Rolling walker (2 wheels) Gait Pattern/deviations: Decreased step length - right, Decreased step length - left, Decreased dorsiflexion - right, Decreased dorsiflexion - left, Decreased stride length, Shuffle Gait velocity: significantly decreased Gait velocity interpretation: <1.31 ft/sec, indicative of household ambulator   General Gait Details: small shuffling steps, minimal DF and heel strike despite cues for longer stride. no overt LOB but limited endurance      Balance Overall balance assessment: Needs assistance Sitting-balance support: Feet unsupported, No upper extremity supported Sitting balance-Leahy Scale: Good Sitting balance - Comments: supervision static sitting EOB Postural control: Posterior lean Standing balance support: No upper extremity supported, During functional activity Standing balance-Leahy Scale: Poor Standing balance comment: needs UE support to maintain balance                            Cognition Arousal: Alert Behavior During Therapy: WFL for tasks assessed/performed Overall Cognitive Status: No family/caregiver present to determine baseline cognitive functioning Area of Impairment: Orientation, Memory, Following commands, Safety/judgement, Attention, Awareness, Problem solving                 Orientation Level: Disoriented to, Place, Time, Situation (pt oriented to name only) Current Attention Level: Focused Memory: Decreased short-term memory Following Commands: Follows one step  commands inconsistently, Follows one step commands with increased time Safety/Judgement: Decreased awareness of safety, Decreased awareness of deficits Awareness: Intellectual Problem  Solving: Slow processing, Decreased initiation, Requires verbal cues, Requires tactile cues, Difficulty sequencing General Comments: possibly at baseline, pleasant. able to follow simple commands        Exercises      General Comments General comments (skin integrity, edema, etc.): VSS on RA      Pertinent Vitals/Pain Pain Assessment Pain Assessment: Faces Faces Pain Scale: Hurts a little bit Pain Location: RLE Pain Descriptors / Indicators: Aching Pain Intervention(s): Limited activity within patient's tolerance, Monitored during session, Repositioned     PT Goals (current goals can now be found in the care plan section) Acute Rehab PT Goals Patient Stated Goal: to go home with son Betty [name] PT Goal Formulation: With patient Time For Goal Achievement: 07/11/23 Potential to Achieve Goals: Fair Progress towards PT goals: Progressing toward goals    Frequency    Min 1X/week       AM-PAC PT "6 Clicks" Mobility   Outcome Measure  Help needed turning from your back to your side while in a flat bed without using bedrails?: None Help needed moving from lying on your back to sitting on the side of a flat bed without using bedrails?: None Help needed moving to and from a bed to a chair (including a wheelchair)?: A Little Help needed standing up from a chair using your arms (e.g., wheelchair or bedside chair)?: A Little Help needed to walk in hospital room?: A Little Help needed climbing 3-5 steps with a railing? : A Little 6 Click Score: 20    End of Session Equipment Utilized During Treatment: Gait belt Activity Tolerance: Patient tolerated treatment well Patient left: in chair;with chair alarm set;with call bell/phone within reach Nurse Communication: Mobility status PT Visit Diagnosis: Unsteadiness on feet (R26.81);Difficulty in walking, not elsewhere classified (R26.2);Muscle weakness (generalized) (M62.81) Pain - Right/Left: Right Pain - part of body: Hip     Time:  1412-1430 PT Time Calculation (min) (ACUTE ONLY): 18 min  Charges:    $Therapeutic Exercise: 8-22 mins PT General Charges $$ ACUTE PT VISIT: 1 Visit                     Vickki Muff, PT, DPT   Acute Rehabilitation Department Office [phone] Secure Chat Communication Preferred   Ronnie Derby 07/05/2023, 2:43 PM

## 2023-07-05 NOTE — TOC Progression Note (Addendum)
Transition of Care [facility]) - Progression Note    Patient Details  Name: Betty [name] MRN: [id number] Date of Birth: 1946-04-03  [facility] CM/SW Contact  Carley Hammed, LCSW Phone Number: 07/05/2023, 9:57 AM  Clinical Narrative:     4:20 CSW spoke with Navi who stated that denial was overturned and pt has auth to go to [facility]. CSW left a VM for facility. CSW updated medical team to approval and continued barriers. CSW spoke with pt's son who stated that Navi updated him as well. Son states he will not be available Sunday, but is good every other day. TOC will continue to follow for DC needs.   CSW spoke with insurance provider who advised that the appeal is currently still under review. CSW will continue to follow for DC planning needs.    Expected Discharge Plan:  (await PT eval) Barriers to Discharge: Continued Medical Work up  Expected Discharge Plan and Services   Discharge Planning Services: CM Consult   Living arrangements for the past 2 months: Single Family Home Expected Discharge Date: 06/27/23               DME Arranged:  (await PT eval)         HH Arranged: PT, OT HH Agency: [facility] Date [facility] Agency Contacted: 06/27/23 Time HH Agency Contacted: [phone] Representative spoke with at [facility] Agency: Kandee Keen   Social Determinants of Health (SDOH) Interventions SDOH Screenings   Food Insecurity: No Food Insecurity (06/25/2023)  Housing: Low Risk  (06/25/2023)  Transportation Needs: No Transportation Needs (06/25/2023)  Utilities: Not At Risk (06/25/2023)  Alcohol Screen: Low Risk  (11/28/2021)  Depression (PHQ2-9): Low Risk  (11/28/2021)  Financial Resource Strain: Low Risk  (11/28/2021)  Physical Activity: Inactive (11/28/2021)  Social Connections: Unknown (11/28/2021)  Stress: No Stress Concern Present (11/28/2021)  Tobacco Use: Medium Risk (06/28/2023)    Readmission Risk Interventions     No data to display

## 2023-07-05 NOTE — Plan of Care (Signed)

## 2023-07-05 NOTE — Progress Notes (Signed)
PROGRESS NOTE  Betty [name]:[id number] DOB: 22-Apr-1946 DOA: 06/24/2023 PCP: Hyacinth Meeker, [place] E, [place]   LOS: 10 days   Brief Narrative / Interim history: 77 y.o. female with a known history of acute combined systolic and diastolic heart failure, CKD stage IV, COPD, dementia, GERD, hypertension, hyperlipidemia, non-small cell carcinoma of the lung, CAD status post MI, pacemaker, presents to the emergency department for evaluation of shortness of breath. Patient son found her today with unsteady gait, shortness of breath, dyspnea on exertion and confused . She was found to have perforated appendicitis.   Subjective / 24h Interval events: No complaint.  Eating breakfast  Assesement and Plan: Principal problem Perforated appendicitis - admitted for IV antibiotics and IV fluids. Transitioned to oral augmentin to complete a 14-day course, currently at day #8, last day 9/3 - further management as per general surgery.  She remains asymptomatic, tolerating a regular diet.  C. difficile was checked and it was negative -She will need follow up with GI for a colonoscopy in 4 to 6 weeks.    Active problems Acute kidney injury on CKD 4 -creatinine still over 4, repeat fluids today   Possible urinary tract infection - Transitioned  to augmentin.    New pulmonary nodules in the setting of non-small cell lung cancer - Will need follow-up with oncology for further workup if desired by family   Chronic combined systolic and diastolic heart failure - Continue metoprolol, atorvastatin, isosorbide dinitrate, holding hydralazine for softer BP parameters.    History of CAD with prior MI - Continue atorvastatin, nitroglycerin, aspirin   History of COPD -respiratory status stable, on room air, no wheezing  History of GERD - Stable   History of dementia - Continue donepezil and memantine.  Needs SNF however insurance refused, family to appeal  Scheduled Meds:  amoxicillin-clavulanate  1 tablet Oral QHS    aspirin EC  81 mg Oral QHS   atorvastatin  40 mg Oral Daily   cholecalciferol  1,000 Units Oral QHS   donepezil  10 mg Oral QHS   enoxaparin (LOVENOX) injection  30 mg Subcutaneous Q24H   febuxostat  40 mg Oral QHS   isosorbide dinitrate  20 mg Oral TID   loratadine  10 mg Oral Daily   memantine  10 mg Oral BID   metoprolol succinate  50 mg Oral Daily   multivitamin with minerals  0.5 tablet Oral QHS   pantoprazole  40 mg Oral q morning   QUEtiapine  25 mg Oral QHS   sodium chloride flush  3 mL Intravenous Q12H   Continuous Infusions:  lactated ringers 10 mL/hr at 07/05/23 0708   PRN Meds:.acetaminophen **OR** acetaminophen, bisacodyl, hydrALAZINE, HYDROcodone-acetaminophen, ipratropium-albuterol, morphine injection, nitroGLYCERIN, ondansetron **OR** ondansetron (ZOFRAN) IV, mouth rinse, senna-docusate, traZODone  Current Outpatient Medications  Medication Instructions   acetaminophen (TYLENOL) 650 mg, Oral, Every 6 hours PRN   amoxicillin-clavulanate (AUGMENTIN) 500-125 MG tablet 1 tablet, Oral, Daily at bedtime   aspirin EC 81 mg, Oral, Daily at bedtime   atorvastatin (LIPITOR) 40 MG tablet TAKE 1 TABLET BY MOUTH EVERY DAY AT 6 PM   cetirizine (ZYRTEC) 10 mg, Oral, Daily   donepezil (ARICEPT) 10 mg, Oral, Daily at bedtime   febuxostat (ULORIC) 40 mg, Oral, Daily at bedtime   hydrALAZINE (APRESOLINE) 50 mg, Oral, 3 times daily   isosorbide dinitrate (ISORDIL) 20 mg, Oral, 3 times daily   memantine (NAMENDA) 10 mg, Oral, 2 times daily   metoprolol succinate (TOPROL-XL) 50 mg,  Oral, Daily, Take with or immediately following a meal.   Multiple Vitamin (MULTIVITAMIN WITH MINERALS) TABS tablet 0.5 tablets, Oral, Daily at bedtime   nitroGLYCERIN (NITROSTAT) 0.4 mg, Sublingual, Every 5 min x3 PRN   pantoprazole (PROTONIX) 40 mg, Oral, Every morning   QUEtiapine (SEROQUEL) 25 mg, Oral, Daily at bedtime   sodium bicarbonate 650 mg, Oral, 2 times daily   Vitamin D 1,000 Units, Oral,  Daily at bedtime    Diet Orders (From admission, onward)     Start     Ordered   06/27/23 0000  Diet - low sodium heart healthy        06/27/23 1454   06/27/23 0000  Diet - low sodium heart healthy        06/27/23 1528   06/25/23 1124  Diet Heart Room service appropriate? Yes; Fluid consistency: Thin  Diet effective now       Question Answer Comment  Room service appropriate? Yes   Fluid consistency: Thin      06/25/23 1123            DVT prophylaxis: enoxaparin (LOVENOX) injection 30 mg Start: 06/25/23 2200 SCDs Start: 06/25/23 1124   Lab Results  Component Value Date   PLT 303 07/04/2023      Code Status: DNR  Family Communication: no family at bedside  Status is: Inpatient  Remains inpatient appropriate because: awaiting placement  Level of care: Telemetry Medical  Consultants:  Surgery   Objective: Vitals:   07/04/23 2029 07/04/23 2056 07/05/23 0526 07/05/23 0718  BP: (!) 130/58 109/62 (!) 131/58 120/64  Pulse: 60 60  (!) 59  Resp: 17 16 16 16   Temp: 98.6 F (37 C) 98.6 F (37 C) 98.6 F (37 C) 97.8 F (36.6 C)  TempSrc: Oral Oral Oral Oral  SpO2: 100% 94% 100% 100%  Weight:      Height:        Intake/Output Summary (Last 24 hours) at 07/05/2023 1052 Last data filed at 07/05/2023 0708 Gross per 24 hour  Intake 571.17 ml  Output --  Net 571.17 ml   Wt Readings from Last 3 Encounters:  06/24/23 55.8 kg  06/03/23 55.8 kg  05/06/23 55.8 kg    Examination: Constitutional: NAD Eyes: lids and conjunctivae normal, no scleral icterus ENMT: mmm Neck: normal, supple Respiratory: clear to auscultation bilaterally, no wheezing, no crackles. Normal respiratory effort.  Cardiovascular: Regular rate and rhythm, no murmurs / rubs / gallops. No LE edema. Abdomen: soft, no distention, no tenderness. Bowel sounds positive.   Data Reviewed: I have independently reviewed following labs and imaging studies   CBC Recent Labs  Lab 07/04/23 0543  WBC  8.4  HGB 10.3*  HCT 31.0*  PLT 303  MCV 90.4  MCH 30.0  MCHC 33.2  RDW 13.0    Recent Labs  Lab 06/29/23 1058 07/04/23 0543 07/05/23 0609  NA 142 139 140  K 3.8 4.4 4.4  CL 109 106 110  CO2 25 21* 21*  GLUCOSE 103* 73 84  BUN 35* 58* 56*  CREATININE 3.81* 4.26* 4.09*  CALCIUM 9.1 9.5 9.0  AST  --  17  --   ALT  --  12  --   ALKPHOS  --  70  --   BILITOT  --  0.7  --   ALBUMIN  --  3.2*  --   MG  --  2.3  --     ------------------------------------------------------------------------------------------------------------------ No results for input(s): "CHOL", "HDL", "  LDLCALC", "TRIG", "CHOLHDL", "LDLDIRECT" in the last 72 hours.  Lab Results  Component Value Date   HGBA1C 6.9 (H) 05/16/2019   ------------------------------------------------------------------------------------------------------------------ No results for input(s): "TSH", "T4TOTAL", "T3FREE", "THYROIDAB" in the last 72 hours.  Invalid input(s): "FREET3"  Cardiac Enzymes No results for input(s): "CKMB", "TROPONINI", "MYOGLOBIN" in the last 168 hours.  Invalid input(s): "CK" ------------------------------------------------------------------------------------------------------------------    Component Value Date/Time   BNP 80.7 06/09/2019 0355    CBG: No results for input(s): "GLUCAP" in the last 168 hours.  No results found for this or any previous visit (from the past 240 hour(s)).    Radiology Studies: No results found.   Pamella Pert, MD, PhD [facility]  Between 7 am - 7 pm I am available, please contact me via Amion (for emergencies) or Securechat (non urgent messages)  Between 7 pm - 7 am I am not available, please contact night coverage MD/APP via Amion

## 2023-07-06 DIAGNOSIS — K3532 Acute appendicitis with perforation and localized peritonitis, without abscess: Secondary | ICD-10-CM | POA: Diagnosis not present

## 2023-07-06 LAB — BASIC METABOLIC PANEL
Anion gap: 10 (ref 5–15)
BUN: 49 mg/dL — ABNORMAL HIGH (ref 8–23)
CO2: 20 mmol/L — ABNORMAL LOW (ref 22–32)
Calcium: 9.1 mg/dL (ref 8.9–10.3)
Chloride: 112 mmol/L — ABNORMAL HIGH (ref 98–111)
Creatinine, Ser: 3.78 mg/dL — ABNORMAL HIGH (ref 0.44–1.00)
GFR, Estimated: 12 mL/min — ABNORMAL LOW (ref >60)
Glucose, Bld: 80 mg/dL (ref 70–99)
Potassium: 4.8 mmol/L (ref 3.5–5.1)
Sodium: 142 mmol/L (ref 135–145)

## 2023-07-06 NOTE — Plan of Care (Signed)

## 2023-07-06 NOTE — TOC Progression Note (Signed)
Transition of Care [facility]) - Progression Note    Patient Details  Name: Betty [name] MRN: [id number] Date of Birth: 04/16/1946  Transition of Care [facility]) CM/SW Contact  Mearl Latin, LCSW Phone Number: 07/06/2023, 8:31 AM  Clinical Narrative:    CSW reached out to [facility] to ascertain bed availability for today. Per Admissions, they do not have any beds available until Monday or Tuesday. Since patient's insurance went to appeal status and was overturned, we are unable to switch facilities without starting the whole process over.   CSW checked insurance details and it has been approved for [facility] with Ref# [id number], Auth ID# [id number], effective 07/05/2023-07/09/2023.    Expected Discharge Plan:  (await PT eval) Barriers to Discharge: Continued Medical Work up  Expected Discharge Plan and Services   Discharge Planning Services: CM Consult   Living arrangements for the past 2 months: Single Family Home Expected Discharge Date: 06/27/23               DME Arranged:  (await PT eval)         HH Arranged: PT, OT HH Agency: [facility] Date [facility] Agency Contacted: 06/27/23 Time HH Agency Contacted: [phone] Representative spoke with at [facility] Agency: Kandee Keen   Social Determinants of Health (SDOH) Interventions SDOH Screenings   Food Insecurity: No Food Insecurity (06/25/2023)  Housing: Low Risk  (06/25/2023)  Transportation Needs: No Transportation Needs (06/25/2023)  Utilities: Not At Risk (06/25/2023)  Alcohol Screen: Low Risk  (11/28/2021)  Depression (PHQ2-9): Low Risk  (11/28/2021)  Financial Resource Strain: Low Risk  (11/28/2021)  Physical Activity: Inactive (11/28/2021)  Social Connections: Unknown (11/28/2021)  Stress: No Stress Concern Present (11/28/2021)  Tobacco Use: Medium Risk (06/28/2023)    Readmission Risk Interventions     No data to display

## 2023-07-06 NOTE — Progress Notes (Addendum)
PROGRESS NOTE  MARGO BROOKHOUSE UVO:[id number] DOB: [birth year] DOA: 06/24/2023 PCP: Hyacinth Meeker, [place] E, PA   LOS: 11 days   Brief Narrative / Interim history: 77 y.o. female with a known history of acute combined systolic and diastolic heart failure, CKD stage IV, COPD, dementia, GERD, hypertension, hyperlipidemia, non-small cell carcinoma of the lung, CAD status post MI, pacemaker, presents to the emergency department for evaluation of shortness of breath. Patient son found her today with unsteady gait, shortness of breath, dyspnea on exertion and confused . She was found to have perforated appendicitis.   Subjective / 24h Interval events: No complaints  Assesement and Plan: Principal problem Perforated appendicitis - admitted for IV antibiotics and IV fluids. Transitioned to oral augmentin to complete a 14-day course, currently at day #8, last day 9/3 - further management as per general surgery.  She remains asymptomatic, tolerating a regular diet.  C. difficile was checked and it was negative -She will need follow up with GI for a colonoscopy in 4 to 6 weeks.    Active problems Acute kidney injury on CKD 4 -creatinine still over 4, repeat fluids today   Possible urinary tract infection - Transitioned  to augmentin.    New pulmonary nodules in the setting of non-small cell lung cancer - Will need follow-up with oncology for further workup if desired by family   Chronic combined systolic and diastolic heart failure - Continue metoprolol, atorvastatin, isosorbide dinitrate, holding hydralazine for softer BP parameters.    History of CAD with prior MI - Continue atorvastatin, nitroglycerin, aspirin   History of COPD -respiratory status stable, on room air, no wheezing  History of GERD - Stable   History of dementia - Continue donepezil and memantine.  Needs SNF however insurance refused, family to appeal  Scheduled Meds:  amoxicillin-clavulanate  1 tablet Oral QHS   aspirin EC  81 mg  Oral QHS   atorvastatin  40 mg Oral Daily   cholecalciferol  1,000 Units Oral QHS   donepezil  10 mg Oral QHS   enoxaparin (LOVENOX) injection  30 mg Subcutaneous Q24H   febuxostat  40 mg Oral QHS   isosorbide dinitrate  20 mg Oral TID   loratadine  10 mg Oral Daily   memantine  10 mg Oral BID   metoprolol succinate  50 mg Oral Daily   multivitamin with minerals  0.5 tablet Oral QHS   pantoprazole  40 mg Oral q morning   QUEtiapine  25 mg Oral QHS   sodium chloride flush  3 mL Intravenous Q12H   Continuous Infusions:  lactated ringers Stopped (07/05/23 1514)   PRN Meds:.acetaminophen **OR** acetaminophen, bisacodyl, hydrALAZINE, HYDROcodone-acetaminophen, ipratropium-albuterol, morphine injection, nitroGLYCERIN, ondansetron **OR** ondansetron (ZOFRAN) IV, mouth rinse, senna-docusate, traZODone  Current Outpatient Medications  Medication Instructions   acetaminophen (TYLENOL) 650 mg, Oral, Every 6 hours PRN   amoxicillin-clavulanate (AUGMENTIN) 500-125 MG tablet 1 tablet, Oral, Daily at bedtime   aspirin EC 81 mg, Oral, Daily at bedtime   atorvastatin (LIPITOR) 40 MG tablet TAKE 1 TABLET BY MOUTH EVERY DAY AT 6 PM   cetirizine (ZYRTEC) 10 mg, Oral, Daily   donepezil (ARICEPT) 10 mg, Oral, Daily at bedtime   febuxostat (ULORIC) 40 mg, Oral, Daily at bedtime   hydrALAZINE (APRESOLINE) 50 mg, Oral, 3 times daily   isosorbide dinitrate (ISORDIL) 20 mg, Oral, 3 times daily   memantine (NAMENDA) 10 mg, Oral, 2 times daily   metoprolol succinate (TOPROL-XL) 50 mg, Oral, Daily, Take with or  immediately following a meal.   Multiple Vitamin (MULTIVITAMIN WITH MINERALS) TABS tablet 0.5 tablets, Oral, Daily at bedtime   nitroGLYCERIN (NITROSTAT) 0.4 mg, Sublingual, Every 5 min x3 PRN   pantoprazole (PROTONIX) 40 mg, Oral, Every morning   QUEtiapine (SEROQUEL) 25 mg, Oral, Daily at bedtime   sodium bicarbonate 650 mg, Oral, 2 times daily   Vitamin D 1,000 Units, Oral, Daily at bedtime     Diet Orders (From admission, onward)     Start     Ordered   06/27/23 0000  Diet - low sodium heart healthy        06/27/23 1454   06/27/23 0000  Diet - low sodium heart healthy        06/27/23 1528   06/25/23 1124  Diet Heart Room service appropriate? Yes; Fluid consistency: Thin  Diet effective now       Question Answer Comment  Room service appropriate? Yes   Fluid consistency: Thin      06/25/23 1123            DVT prophylaxis: enoxaparin (LOVENOX) injection 30 mg Start: 06/25/23 2200 SCDs Start: 06/25/23 1124   Lab Results  Component Value Date   PLT 303 07/04/2023      Code Status: DNR  Family Communication: no family at bedside  Status is: Inpatient  Remains inpatient appropriate because: awaiting placement  Level of care: Telemetry Medical  Consultants:  Surgery   Objective: Vitals:   07/05/23 1528 07/05/23 2100 07/06/23 0348 07/06/23 0753  BP: (!) 104/46 (!) 124/54 (!) 137/54 139/64  Pulse: (!) 58 61 (!) 59 (!) 59  Resp: 16 19 16    Temp: (!) 97.5 F (36.4 C) 98.2 F (36.8 C) 97.9 F (36.6 C) 98 F (36.7 C)  TempSrc: Oral Oral Oral   SpO2: 100% 100% 100% 100%  Weight:      Height:        Intake/Output Summary (Last 24 hours) at 07/06/2023 1346 Last data filed at 07/06/2023 1127 Gross per 24 hour  Intake 465 ml  Output --  Net 465 ml   Wt Readings from Last 3 Encounters:  06/24/23 55.8 kg  06/03/23 55.8 kg  05/06/23 55.8 kg    Examination: Constitutional: NAD  Data Reviewed: I have independently reviewed following labs and imaging studies   CBC Recent Labs  Lab 07/04/23 0543  WBC 8.4  HGB 10.3*  HCT 31.0*  PLT 303  MCV 90.4  MCH 30.0  MCHC 33.2  RDW 13.0    Recent Labs  Lab 07/04/23 0543 07/05/23 0609 07/06/23 0603  NA 139 140 142  K 4.4 4.4 4.8  CL 106 110 112*  CO2 21* 21* 20*  GLUCOSE 73 84 80  BUN 58* 56* 49*  CREATININE 4.26* 4.09* 3.78*  CALCIUM 9.5 9.0 9.1  AST 17  --   --   ALT 12  --   --    ALKPHOS 70  --   --   BILITOT 0.7  --   --   ALBUMIN 3.2*  --   --   MG 2.3  --   --     ------------------------------------------------------------------------------------------------------------------ No results for input(s): "CHOL", "HDL", "LDLCALC", "TRIG", "CHOLHDL", "LDLDIRECT" in the last 72 hours.  Lab Results  Component Value Date   HGBA1C 6.9 (H) 05/16/2019   ------------------------------------------------------------------------------------------------------------------ No results for input(s): "TSH", "T4TOTAL", "T3FREE", "THYROIDAB" in the last 72 hours.  Invalid input(s): "FREET3"  Cardiac Enzymes No results for input(s): "CKMB", "TROPONINI", "  MYOGLOBIN" in the last 168 hours.  Invalid input(s): "CK" ------------------------------------------------------------------------------------------------------------------    Component Value Date/Time   BNP 80.7 06/09/2019 0355    CBG: No results for input(s): "GLUCAP" in the last 168 hours.  No results found for this or any previous visit (from the past 240 hour(s)).    Radiology Studies: No results found.   Pamella Pert, MD, PhD [facility]  Between 7 am - 7 pm I am available, please contact me via Amion (for emergencies) or Securechat (non urgent messages)  Between 7 pm - 7 am I am not available, please contact night coverage MD/APP via Amion

## 2023-07-07 DIAGNOSIS — K3532 Acute appendicitis with perforation and localized peritonitis, without abscess: Secondary | ICD-10-CM | POA: Diagnosis not present

## 2023-07-07 NOTE — Plan of Care (Signed)
Patient without distress. All needs attended to. Sleeping well. Will continue to monitor.   Problem: Clinical Measurements: Goal: Ability to maintain clinical measurements within normal limits will improve Outcome: Progressing   Problem: Activity: Goal: Risk for activity intolerance will decrease Outcome: Progressing   Problem: Nutrition: Goal: Adequate nutrition will be maintained Outcome: Progressing   Problem: Pain Managment: Goal: General experience of comfort will improve Outcome: Progressing   Problem: Safety: Goal: Ability to remain free from injury will improve Outcome: Progressing

## 2023-07-07 NOTE — Progress Notes (Signed)
PROGRESS NOTE  Betty [name] UEA:[id number] DOB: 01-01-1946 DOA: 06/24/2023 PCP: Hyacinth Meeker, [place] E, PA   LOS: 12 days   Brief Narrative / Interim history: 77 y.o. female with a known history of acute combined systolic and diastolic heart failure, CKD stage IV, COPD, dementia, GERD, hypertension, hyperlipidemia, non-small cell carcinoma of the lung, CAD status post MI, pacemaker, presents to the emergency department for evaluation of shortness of breath. Patient son found her today with unsteady gait, shortness of breath, dyspnea on exertion and confused . She was found to have perforated appendicitis.   Subjective / 24h Interval events: No complaints  Assesement and Plan: Principal problem Perforated appendicitis - admitted for IV antibiotics and IV fluids. Transitioned to oral augmentin to complete a 14-day course, currently at day #8, last day 9/3 - further management as per general surgery.  She remains asymptomatic, tolerating a regular diet.  C. difficile was checked and it was negative -She will need follow up with GI for a colonoscopy in 4 to 6 weeks.    Active problems Acute kidney injury on CKD 4 -creatinine still over 4, repeat fluids today   Possible urinary tract infection - Transitioned  to augmentin.    New pulmonary nodules in the setting of non-small cell lung cancer - Will need follow-up with oncology for further workup if desired by family   Chronic combined systolic and diastolic heart failure - Continue metoprolol, atorvastatin, isosorbide dinitrate, holding hydralazine for softer BP parameters.    History of CAD with prior MI - Continue atorvastatin, nitroglycerin, aspirin   History of COPD -respiratory status stable, on room air, no wheezing  History of GERD - Stable   History of dementia - Continue donepezil and memantine.  Needs SNF however insurance refused, family to appeal  Scheduled Meds:  amoxicillin-clavulanate  1 tablet Oral QHS   aspirin EC  81 mg  Oral QHS   atorvastatin  40 mg Oral Daily   cholecalciferol  1,000 Units Oral QHS   donepezil  10 mg Oral QHS   enoxaparin (LOVENOX) injection  30 mg Subcutaneous Q24H   febuxostat  40 mg Oral QHS   isosorbide dinitrate  20 mg Oral TID   loratadine  10 mg Oral Daily   memantine  10 mg Oral BID   metoprolol succinate  50 mg Oral Daily   multivitamin with minerals  0.5 tablet Oral QHS   pantoprazole  40 mg Oral q morning   QUEtiapine  25 mg Oral QHS   sodium chloride flush  3 mL Intravenous Q12H   Continuous Infusions:  lactated ringers Stopped (07/05/23 1514)   PRN Meds:.acetaminophen **OR** acetaminophen, bisacodyl, hydrALAZINE, HYDROcodone-acetaminophen, ipratropium-albuterol, morphine injection, nitroGLYCERIN, ondansetron **OR** ondansetron (ZOFRAN) IV, mouth rinse, senna-docusate, traZODone  Current Outpatient Medications  Medication Instructions   acetaminophen (TYLENOL) 650 mg, Oral, Every 6 hours PRN   amoxicillin-clavulanate (AUGMENTIN) 500-125 MG tablet 1 tablet, Oral, Daily at bedtime   aspirin EC 81 mg, Oral, Daily at bedtime   atorvastatin (LIPITOR) 40 MG tablet TAKE 1 TABLET BY MOUTH EVERY DAY AT 6 PM   cetirizine (ZYRTEC) 10 mg, Oral, Daily   donepezil (ARICEPT) 10 mg, Oral, Daily at bedtime   febuxostat (ULORIC) 40 mg, Oral, Daily at bedtime   hydrALAZINE (APRESOLINE) 50 mg, Oral, 3 times daily   isosorbide dinitrate (ISORDIL) 20 mg, Oral, 3 times daily   memantine (NAMENDA) 10 mg, Oral, 2 times daily   metoprolol succinate (TOPROL-XL) 50 mg, Oral, Daily, Take with or  immediately following a meal.   Multiple Vitamin (MULTIVITAMIN WITH MINERALS) TABS tablet 0.5 tablets, Oral, Daily at bedtime   nitroGLYCERIN (NITROSTAT) 0.4 mg, Sublingual, Every 5 min x3 PRN   pantoprazole (PROTONIX) 40 mg, Oral, Every morning   QUEtiapine (SEROQUEL) 25 mg, Oral, Daily at bedtime   sodium bicarbonate 650 mg, Oral, 2 times daily   Vitamin D 1,000 Units, Oral, Daily at bedtime     Diet Orders (From admission, onward)     Start     Ordered   06/27/23 0000  Diet - low sodium heart healthy        06/27/23 1454   06/27/23 0000  Diet - low sodium heart healthy        06/27/23 1528   06/25/23 1124  Diet Heart Room service appropriate? Yes; Fluid consistency: Thin  Diet effective now       Question Answer Comment  Room service appropriate? Yes   Fluid consistency: Thin      06/25/23 1123            DVT prophylaxis: enoxaparin (LOVENOX) injection 30 mg Start: 06/25/23 2200 SCDs Start: 06/25/23 1124   Lab Results  Component Value Date   PLT 303 07/04/2023      Code Status: DNR  Family Communication: no family at bedside  Status is: Inpatient  Remains inpatient appropriate because: awaiting placement  Level of care: Telemetry Medical  Consultants:  Surgery   Objective: Vitals:   07/06/23 2132 07/07/23 0527 07/07/23 0527 07/07/23 0739  BP: (!) 115/57 (!) 128/57 (!) 128/57 113/64  Pulse: 60 60 60 60  Resp: 18 18 18 15   Temp: 98.3 F (36.8 C) (!) 97.5 F (36.4 C) (!) 97.5 F (36.4 C) (!) 97.5 F (36.4 C)  TempSrc: Oral Oral Oral Oral  SpO2: 100% 100% 100% 100%  Weight:      Height:        Intake/Output Summary (Last 24 hours) at 07/07/2023 1027 Last data filed at 07/06/2023 1524 Gross per 24 hour  Intake 480 ml  Output --  Net 480 ml   Wt Readings from Last 3 Encounters:  06/24/23 55.8 kg  06/03/23 55.8 kg  05/06/23 55.8 kg    Examination: Constitutional: NAD  Data Reviewed: I have independently reviewed following labs and imaging studies   CBC Recent Labs  Lab 07/04/23 0543  WBC 8.4  HGB 10.3*  HCT 31.0*  PLT 303  MCV 90.4  MCH 30.0  MCHC 33.2  RDW 13.0    Recent Labs  Lab 07/04/23 0543 07/05/23 0609 07/06/23 0603  NA 139 140 142  K 4.4 4.4 4.8  CL 106 110 112*  CO2 21* 21* 20*  GLUCOSE 73 84 80  BUN 58* 56* 49*  CREATININE 4.26* 4.09* 3.78*  CALCIUM 9.5 9.0 9.1  AST 17  --   --   ALT 12  --   --    ALKPHOS 70  --   --   BILITOT 0.7  --   --   ALBUMIN 3.2*  --   --   MG 2.3  --   --     ------------------------------------------------------------------------------------------------------------------ No results for input(s): "CHOL", "HDL", "LDLCALC", "TRIG", "CHOLHDL", "LDLDIRECT" in the last 72 hours.  Lab Results  Component Value Date   HGBA1C 6.9 (H) 05/16/2019   ------------------------------------------------------------------------------------------------------------------ No results for input(s): "TSH", "T4TOTAL", "T3FREE", "THYROIDAB" in the last 72 hours.  Invalid input(s): "FREET3"  Cardiac Enzymes No results for input(s): "CKMB", "TROPONINI", "MYOGLOBIN"  in the last 168 hours.  Invalid input(s): "CK" ------------------------------------------------------------------------------------------------------------------    Component Value Date/Time   BNP 80.7 06/09/2019 0355    CBG: No results for input(s): "GLUCAP" in the last 168 hours.  No results found for this or any previous visit (from the past 240 hour(s)).    Radiology Studies: No results found.   Pamella Pert, MD, PhD [facility]  Between 7 am - 7 pm I am available, please contact me via Amion (for emergencies) or Securechat (non urgent messages)  Between 7 pm - 7 am I am not available, please contact night coverage MD/APP via Amion

## 2023-07-07 NOTE — Plan of Care (Signed)

## 2023-07-08 DIAGNOSIS — I700 Atherosclerosis of aorta: Secondary | ICD-10-CM | POA: Diagnosis not present

## 2023-07-08 DIAGNOSIS — Z79899 Other long term (current) drug therapy: Secondary | ICD-10-CM | POA: Diagnosis not present

## 2023-07-08 DIAGNOSIS — M109 Gout, unspecified: Secondary | ICD-10-CM | POA: Diagnosis not present

## 2023-07-08 DIAGNOSIS — Z7401 Bed confinement status: Secondary | ICD-10-CM | POA: Diagnosis not present

## 2023-07-08 DIAGNOSIS — E440 Moderate protein-calorie malnutrition: Secondary | ICD-10-CM | POA: Diagnosis not present

## 2023-07-08 DIAGNOSIS — N184 Chronic kidney disease, stage 4 (severe): Secondary | ICD-10-CM | POA: Diagnosis not present

## 2023-07-08 DIAGNOSIS — Z87891 Personal history of nicotine dependence: Secondary | ICD-10-CM | POA: Diagnosis not present

## 2023-07-08 DIAGNOSIS — I2510 Atherosclerotic heart disease of native coronary artery without angina pectoris: Secondary | ICD-10-CM | POA: Diagnosis not present

## 2023-07-08 DIAGNOSIS — I6523 Occlusion and stenosis of bilateral carotid arteries: Secondary | ICD-10-CM | POA: Diagnosis not present

## 2023-07-08 DIAGNOSIS — S0990XA Unspecified injury of head, initial encounter: Secondary | ICD-10-CM | POA: Diagnosis not present

## 2023-07-08 DIAGNOSIS — K219 Gastro-esophageal reflux disease without esophagitis: Secondary | ICD-10-CM | POA: Diagnosis not present

## 2023-07-08 DIAGNOSIS — R2689 Other abnormalities of gait and mobility: Secondary | ICD-10-CM | POA: Diagnosis not present

## 2023-07-08 DIAGNOSIS — J449 Chronic obstructive pulmonary disease, unspecified: Secondary | ICD-10-CM | POA: Diagnosis not present

## 2023-07-08 DIAGNOSIS — S199XXA Unspecified injury of neck, initial encounter: Secondary | ICD-10-CM | POA: Diagnosis not present

## 2023-07-08 DIAGNOSIS — Z7982 Long term (current) use of aspirin: Secondary | ICD-10-CM | POA: Diagnosis not present

## 2023-07-08 DIAGNOSIS — E785 Hyperlipidemia, unspecified: Secondary | ICD-10-CM | POA: Diagnosis not present

## 2023-07-08 DIAGNOSIS — C3491 Malignant neoplasm of unspecified part of right bronchus or lung: Secondary | ICD-10-CM | POA: Diagnosis not present

## 2023-07-08 DIAGNOSIS — M6281 Muscle weakness (generalized): Secondary | ICD-10-CM | POA: Diagnosis not present

## 2023-07-08 DIAGNOSIS — R918 Other nonspecific abnormal finding of lung field: Secondary | ICD-10-CM | POA: Diagnosis not present

## 2023-07-08 DIAGNOSIS — I5022 Chronic systolic (congestive) heart failure: Secondary | ICD-10-CM | POA: Diagnosis not present

## 2023-07-08 DIAGNOSIS — I517 Cardiomegaly: Secondary | ICD-10-CM | POA: Diagnosis not present

## 2023-07-08 DIAGNOSIS — R1900 Intra-abdominal and pelvic swelling, mass and lump, unspecified site: Secondary | ICD-10-CM | POA: Diagnosis not present

## 2023-07-08 DIAGNOSIS — F01B0 Vascular dementia, moderate, without behavioral disturbance, psychotic disturbance, mood disturbance, and anxiety: Secondary | ICD-10-CM | POA: Diagnosis not present

## 2023-07-08 DIAGNOSIS — I130 Hypertensive heart and chronic kidney disease with heart failure and stage 1 through stage 4 chronic kidney disease, or unspecified chronic kidney disease: Secondary | ICD-10-CM | POA: Diagnosis not present

## 2023-07-08 DIAGNOSIS — K3533 Acute appendicitis with perforation and localized peritonitis, with abscess: Secondary | ICD-10-CM | POA: Diagnosis not present

## 2023-07-08 DIAGNOSIS — I10 Essential (primary) hypertension: Secondary | ICD-10-CM | POA: Diagnosis not present

## 2023-07-08 DIAGNOSIS — I428 Other cardiomyopathies: Secondary | ICD-10-CM | POA: Diagnosis not present

## 2023-07-08 DIAGNOSIS — I5042 Chronic combined systolic (congestive) and diastolic (congestive) heart failure: Secondary | ICD-10-CM | POA: Diagnosis not present

## 2023-07-08 DIAGNOSIS — E559 Vitamin D deficiency, unspecified: Secondary | ICD-10-CM | POA: Diagnosis not present

## 2023-07-08 DIAGNOSIS — M549 Dorsalgia, unspecified: Secondary | ICD-10-CM | POA: Diagnosis not present

## 2023-07-08 DIAGNOSIS — Z950 Presence of cardiac pacemaker: Secondary | ICD-10-CM | POA: Diagnosis not present

## 2023-07-08 DIAGNOSIS — Z743 Need for continuous supervision: Secondary | ICD-10-CM | POA: Diagnosis not present

## 2023-07-08 DIAGNOSIS — K3532 Acute appendicitis with perforation and localized peritonitis, without abscess: Secondary | ICD-10-CM | POA: Diagnosis not present

## 2023-07-08 DIAGNOSIS — R0602 Shortness of breath: Secondary | ICD-10-CM | POA: Diagnosis not present

## 2023-07-08 DIAGNOSIS — R9089 Other abnormal findings on diagnostic imaging of central nervous system: Secondary | ICD-10-CM | POA: Diagnosis not present

## 2023-07-08 DIAGNOSIS — R0682 Tachypnea, not elsewhere classified: Secondary | ICD-10-CM | POA: Diagnosis not present

## 2023-07-08 DIAGNOSIS — J189 Pneumonia, unspecified organism: Secondary | ICD-10-CM | POA: Diagnosis not present

## 2023-07-08 DIAGNOSIS — E782 Mixed hyperlipidemia: Secondary | ICD-10-CM | POA: Diagnosis not present

## 2023-07-08 DIAGNOSIS — I120 Hypertensive chronic kidney disease with stage 5 chronic kidney disease or end stage renal disease: Secondary | ICD-10-CM | POA: Diagnosis not present

## 2023-07-08 DIAGNOSIS — N185 Chronic kidney disease, stage 5: Secondary | ICD-10-CM | POA: Diagnosis not present

## 2023-07-08 DIAGNOSIS — M542 Cervicalgia: Secondary | ICD-10-CM | POA: Diagnosis present

## 2023-07-08 DIAGNOSIS — R911 Solitary pulmonary nodule: Secondary | ICD-10-CM | POA: Diagnosis not present

## 2023-07-08 DIAGNOSIS — R1311 Dysphagia, oral phase: Secondary | ICD-10-CM | POA: Diagnosis not present

## 2023-07-08 DIAGNOSIS — Y92009 Unspecified place in unspecified non-institutional (private) residence as the place of occurrence of the external cause: Secondary | ICD-10-CM | POA: Diagnosis not present

## 2023-07-08 DIAGNOSIS — W19XXXA Unspecified fall, initial encounter: Secondary | ICD-10-CM | POA: Diagnosis not present

## 2023-07-08 MED ORDER — AMOXICILLIN-POT CLAVULANATE 500-125 MG PO TABS: 1.0000 | ORAL_TABLET | Freq: Every day | ORAL | Status: AC

## 2023-07-08 NOTE — Plan of Care (Signed)
  Problem: Pain Managment: Goal: General experience of comfort will improve Outcome: Progressing   Problem: Safety: Goal: Ability to remain free from injury will improve Outcome: Progressing   

## 2023-07-08 NOTE — TOC Progression Note (Signed)
Transition of Care [facility]) - Progression Note    Patient Details  Name: Betty [name] MRN: [id number] Date of Birth: Nov 08, 1945  Transition of Care [facility]) CM/SW Contact  Lorri Frederick, LCSW Phone Number: 07/08/2023, 9:49 AM  Clinical Narrative:   CSW spoke with [facility]/Nikki/  Bed available, 2 person room, but pt will have the room to herself.  Pt oriented x1.  CSW spoke with pt son [name], who is in agreement with this room.  Discussed transportation, [name] is able to transport, with assistance.  MD informed.    Expected Discharge Plan:  (await PT eval) Barriers to Discharge: Continued Medical Work up  Expected Discharge Plan and Services   Discharge Planning Services: CM Consult   Living arrangements for the past 2 months: Single Family Home Expected Discharge Date: 07/08/23               DME Arranged:  (await PT eval)         HH Arranged: PT, OT HH Agency: [facility] Date [facility] Agency Contacted: 06/27/23 Time HH Agency Contacted: [phone] Representative spoke with at [facility] Agency: Kandee Keen   Social Determinants of Health (SDOH) Interventions SDOH Screenings   Food Insecurity: No Food Insecurity (06/25/2023)  Housing: Low Risk  (06/25/2023)  Transportation Needs: No Transportation Needs (06/25/2023)  Utilities: Not At Risk (06/25/2023)  Alcohol Screen: Low Risk  (11/28/2021)  Depression (PHQ2-9): Low Risk  (11/28/2021)  Financial Resource Strain: Low Risk  (11/28/2021)  Physical Activity: Inactive (11/28/2021)  Social Connections: Unknown (11/28/2021)  Stress: No Stress Concern Present (11/28/2021)  Tobacco Use: Medium Risk (06/28/2023)    Readmission Risk Interventions     No data to display

## 2023-07-08 NOTE — Progress Notes (Signed)
Call placed to [facility] at [phone].  Report was given to shift nurse Erie Noe. AVS printed to be given to son [name].

## 2023-07-08 NOTE — TOC Transition Note (Signed)
Transition of Care [facility]) - CM/SW Discharge Note   Patient Details  Name: Betty [name] MRN: [id number] Date of Birth: 01-23-1946  Transition of Care [facility]) CM/SW Contact:  Lorri Frederick, LCSW Phone Number: 07/08/2023, 9:53 AM   Clinical Narrative:   Pt discharging to [facility].  RN call report to [phone].    Pt son [name] will provide transportation and will need pt brought down to main entrance with assistance getting into the vehicle.      Final next level of care: Skilled Nursing Facility Barriers to Discharge: Barriers Resolved   Patient Goals and CMS Choice      Discharge Placement                Patient chooses bed at: [facility] Patient to be transferred to facility by: son [name] Name of family member notified: son [name] Patient and family notified of of transfer: 07/08/23  Discharge Plan and Services Additional resources added to the After Visit Summary for     Discharge Planning Services: CM Consult            DME Arranged:  (await PT eval)         HH Arranged: PT, OT HH Agency: [facility] Date [facility] Agency Contacted: 06/27/23 Time HH Agency Contacted: [phone] Representative spoke with at [facility] Agency: Kandee Keen  Social Determinants of Health (SDOH) Interventions SDOH Screenings   Food Insecurity: No Food Insecurity (06/25/2023)  Housing: Low Risk  (06/25/2023)  Transportation Needs: No Transportation Needs (06/25/2023)  Utilities: Not At Risk (06/25/2023)  Alcohol Screen: Low Risk  (11/28/2021)  Depression (PHQ2-9): Low Risk  (11/28/2021)  Financial Resource Strain: Low Risk  (11/28/2021)  Physical Activity: Inactive (11/28/2021)  Social Connections: Unknown (11/28/2021)  Stress: No Stress Concern Present (11/28/2021)  Tobacco Use: Medium Risk (06/28/2023)     Readmission Risk Interventions     No data to display

## 2023-07-08 NOTE — Discharge Summary (Signed)
Physician Discharge Summary  Betty [name]:[id number] DOB: Dec 04, 1945 DOA: 06/24/2023  PCP: Collene Mares, PA  Admit date: 06/24/2023 Discharge date: 07/08/2023  Admitted From: home Disposition:  SNF  Recommendations for Outpatient Follow-up:  Follow up with PCP in 1-2 weeks Please obtain BMP/CBC in one week  Home Health: none Equipment/Devices: none  Discharge Condition: stable CODE STATUS: DNR Diet Orders (From admission, onward)     Start     Ordered   06/27/23 0000  Diet - low sodium heart healthy        06/27/23 1454   06/27/23 0000  Diet - low sodium heart healthy        06/27/23 1528   06/25/23 1124  Diet Heart Room service appropriate? Yes; Fluid consistency: Thin  Diet effective now       Question Answer Comment  Room service appropriate? Yes   Fluid consistency: Thin      06/25/23 1123            HPI: Per admitting MD, Betty [name] is a 77 y.o. female with a known history of acute combined systolic and diastolic heart failure, CKD stage IV, COPD, dementia, GERD, hypertension, hyperlipidemia, non-small cell carcinoma of the lung, CAD status post MI, pacemaker, presents to the emergency department for evaluation of shortness of breath.  Patient son found her today with unsteady gait, shortness of breath, dyspnea on exertion and confused. She was covered in urine and feces.  Patient denies any complaints, states that she is fine.  Of note patient recently had an abdominal mass found questionable ovarian versus appendix.  It was recommended that she have an MRI but since she has a pacemaker she was unable to have it completed therefore she completed 2 weeks of Augmentin and is supposed to follow-up with surgery. Patient lives alone and is independent with ADLs.  Patient denies fevers/chills, weakness, dizziness, chest pain, shortness of breath, N/V/C/D, abdominal pain, dysuria/frequency, changes in mental status. Otherwise there has been no change in status. Patient  has been taking medication as prescribed and there has been no recent change in medication or diet.  There has been no recent illness, hospitalizations, travel or sick contacts.   Hospital Course / Discharge diagnoses: Principal problem Perforated appendicitis - admitted for IV antibiotics and IV fluids. Transitioned to oral augmentin to complete a 14-day course, last treatment day 9/4. Further management as per general surgery as an outpatient.  She remains asymptomatic, tolerating a regular diet.  C. difficile was checked and it was negative. -She will need follow up with GI for a colonoscopy in 4 to 6 weeks.    Active problems Acute kidney injury on CKD 4 -creatinine overall stable Possible urinary tract infection - Transitioned  to augmentin.  New pulmonary nodules in the setting of non-small cell lung cancer - Will need follow-up with oncology for further workup if desired by family Chronic combined systolic and diastolic heart failure - Continue metoprolol, atorvastatin, isosorbide dinitrate, holding hydralazine for softer BP parameters.  History of CAD with prior MI - Continue atorvastatin, nitroglycerin, aspirin History of COPD -respiratory status stable, on room air, no wheezing History of GERD - Stable History of dementia - Continue donepezil and memantine.   Sepsis ruled out   Discharge Instructions  Discharge Instructions     Diet - low sodium heart healthy   Complete by: As directed    Diet - low sodium heart healthy   Complete by: As directed  Discharge instructions   Complete by: As directed    Please follow up with General surgery as recommended.      Allergies as of 07/08/2023       Reactions   Sulfa Antibiotics Itching        Medication List     STOP taking these medications    hydrALAZINE 50 MG tablet Commonly known as: APRESOLINE       TAKE these medications    acetaminophen 325 MG tablet Commonly known as: TYLENOL Take 650 mg by mouth every  6 (six) hours as needed for mild pain or moderate pain.   amoxicillin-clavulanate 500-125 MG tablet Commonly known as: AUGMENTIN Take 1 tablet by mouth at bedtime for 3 days.   aspirin EC 81 MG tablet Take 81 mg by mouth at bedtime.   atorvastatin 40 MG tablet Commonly known as: LIPITOR TAKE 1 TABLET BY MOUTH EVERY DAY AT 6 PM   cetirizine 10 MG tablet Commonly known as: ZYRTEC Take 10 mg by mouth daily.   donepezil 10 MG tablet Commonly known as: ARICEPT Take 1 tablet (10 mg total) by mouth at bedtime.   febuxostat 40 MG tablet Commonly known as: ULORIC Take 40 mg by mouth at bedtime.   isosorbide dinitrate 20 MG tablet Commonly known as: ISORDIL TAKE 1 TABLET BY MOUTH THREE TIMES DAILY   memantine 10 MG tablet Commonly known as: NAMENDA Take 1 tablet (10 mg total) by mouth 2 (two) times daily.   metoprolol succinate 50 MG 24 hr tablet Commonly known as: TOPROL-XL Take 1 tablet (50 mg total) by mouth daily. Take with or immediately following a meal.   multivitamin with minerals Tabs tablet Take 0.5 tablets by mouth at bedtime.   nitroGLYCERIN 0.4 MG SL tablet Commonly known as: NITROSTAT Place 1 tablet (0.4 mg total) under the tongue every 5 (five) minutes x 3 doses as needed for chest pain.   pantoprazole 40 MG tablet Commonly known as: PROTONIX Take 40 mg by mouth every morning.   QUEtiapine 25 MG tablet Commonly known as: SEROQUEL Take 25 mg by mouth at bedtime.   sodium bicarbonate 650 MG tablet Take 1 tablet (650 mg total) by mouth 2 (two) times daily.   Vitamin D 50 MCG (2000 UT) tablet Take 1,000 Units by mouth at bedtime.        Contact information for follow-up providers     Violeta Gelinas, MD Follow up on 07/03/2023.   Specialty: General Surgery Why: 1140. Please arrive 30 minutes prior to your appointment for paperwork. Please bring a copy of your photo ID and insurance card. Contact information: [address] [place]  [address] [phone]         Gastroenterology Follow up.   Why: We have made a referral to Gastroenterology for a colonoscopy. We would recommended obtaining a colonoscopy in 4-6 weeks.        Hyacinth Meeker, [place], [place] Follow up.   Specialty: Internal Medicine Contact information: [address] [place] [address] [phone]         Si Gaul, MD Follow up.   Specialty: Oncology Contact information: [address] [place] [address] [phone]         Care, [facility] Follow up.   Specialty: Home Health Services Why: [facility] THEY WILL CALL YOU TO SCHEDULE Contact information: [address] [place] [address] [phone]  Contact information for after-discharge care     Destination     [facility] Preferred SNF .   Service: Skilled Nursing Contact information: [address] [place] [address] [phone]                     Consultations: General surgery   Procedures/Studies:  CT CHEST ABDOMEN PELVIS WO CONTRAST  Addendum Date: 06/25/2023   ADDENDUM REPORT: 06/25/2023 01:51 ADDENDUM: These results were called by telephone at the time of interpretation on 06/25/2023 at 1:51 am to provider [facility] , who verbally acknowledged these results. Electronically Signed   By: Tish Frederickson M.D.   On: 06/25/2023 01:51   Result Date: 06/25/2023 CLINICAL DATA:  Non-small cell lung cancer (NSCLC), metastatic, assess treatment response. SOB and right leg weakness started around noon yesterday. Pt is eupnei EXAM: CT CHEST, ABDOMEN AND PELVIS WITHOUT CONTRAST TECHNIQUE: Multidetector CT imaging of the chest, abdomen and pelvis was performed following the standard protocol without IV contrast. RADIATION DOSE REDUCTION: This exam was performed according to the departmental dose-optimization program which includes automated exposure  control, adjustment of the mA and/or kV according to patient size and/or use of iterative reconstruction technique. COMPARISON:  CT chest 08/29/2020, CT abdomen pelvis 06/04/2023 FINDINGS: CT CHEST FINDINGS Cardiovascular: Enlarged heart size. No significant pericardial effusion. The thoracic aorta is normal in caliber. Severe atherosclerotic plaque of the thoracic aorta. Four-vessel coronary artery calcifications. Mediastinum/Nodes: No gross hilar adenopathy, noting limited sensitivity for the detection of hilar adenopathy on this noncontrast study. no enlarged mediastinal or axillary lymph nodes. Thyroid gland, trachea, and esophagus demonstrate no significant findings. Small to moderate volume hiatal hernia. Lungs/Pleura: Centrilobular emphysematous changes. Slightly more conspicuous right upper lobe 11 x 7 mm pulmonary nodule with surrounding ground-glass airspace opacity (5:55). Interval development of a right upper lobe spiculated 11 x 7 mm pulmonary nodule (5:79). Slightly more conspicuous area scarring versus cluster of nodularity within the right upper lobe (5:50 5-57). Interval development of a subsolid left upper lobe 8 x 8 mm pulmonary nodule (5:51). Similar scarring at the left apex measuring 10 x 6 mm (5:34). Anteriorly along the left apex there is similar-appearing scarring. Interval development of a left lower lobe subpleural 10 x 10 mm ground-glass pulmonary nodule (5:98). Stable solid 5 mm right middle lobe pulmonary nodule (5:90). No pulmonary mass. No pleural effusion. No pneumothorax. Musculoskeletal: No chest wall abnormality. No suspicious lytic or blastic osseous lesions. No acute displaced fracture. Multilevel degenerative changes of the spine. CT ABDOMEN PELVIS FINDINGS Hepatobiliary: No focal liver abnormality. Calcified gallstones noted within the gallbladder lumen. No gallbladder wall thickening or pericholecystic fluid. No biliary dilatation. Pancreas: No focal lesion. Normal pancreatic  contour. No surrounding inflammatory changes. No main pancreatic ductal dilatation. Spleen: Normal in size without focal abnormality. Adrenals/Urinary Tract: No adrenal nodule bilaterally. No nephrolithiasis and no hydronephrosis. Hyperdense subcentimeter left renal lesion likely a proteinaceous versus hemorrhagic cyst-no further follow-up indicated. Fluid density lesions within bilateral kidneys likely represent simple renal cysts. Simple renal cysts, in the absence of clinically indicated signs/symptoms, require no independent follow-up. No ureterolithiasis or hydroureter. The urinary bladder is unremarkable. Stomach/Bowel: Stomach is within normal limits. No evidence of bowel wall thickening or dilatation. Colonic diverticulosis. The base of the appendix is enlarged in caliber measuring up to 1.1 cm with associated mild peri appendiceal fat stranding. The tip is not visualized with in its place a difficult to measure approximately 4 cm gas and fluid  collection (versus less likely mass) along the expected region of the right ovary. The gas and fluid collection along the right adnexa is noted to be inseparable from the uterus as well as from a short segment of sigmoid colon. Vascular/Lymphatic: No abdominal aorta or iliac aneurysm. Mild atherosclerotic plaque of the aorta and its branches. No abdominal, pelvic, or inguinal lymphadenopathy. Reproductive: Right adnexa demonstrates findings noted in stomach/bowel. Normal left adnexa. Coarsely calcified uterine fibroids are noted. Other: No intraperitoneal free fluid. No intraperitoneal free gas. No organized fluid collection. Musculoskeletal: No abdominal wall hernia or abnormality. No suspicious lytic or blastic osseous lesions. No acute displaced fracture. Multilevel degenerative changes of the spine. IMPRESSION: 1. Likely perforated acute appendicitis with likely developing approximately 4 cm abscess in the right adnexal region in the expected region of the  appendiceal tip. Finding is inseparable from the uterus and a short portion of the sigmoid colon. Differential diagnosis includes tubo-ovarian abscess, malignancy, less likely complicated diverticulitis. Markedly limited evaluation on this noncontrast study. 2. New pulmonary nodules in a patient with history of lung cancer (comparison to 2021). Limited evaluation on this noncontrast study. Additional imaging evaluation or consultation with Pulmonology or Thoracic Surgery recommended. 3. Interval development of a subsolid left upper lobe 8 x 8 mm pulmonary nodule. 4. Interval development of a left lower lobe subpleural 10 x 10 mm ground-glass pulmonary nodule . 5. Interval development of a right upper lobe spiculated 11 x 7 mm pulmonary nodule. Slightly more conspicuous right upper lobe 11 x 7 mm pulmonary nodule with surrounding ground-glass airspace opacity. 6. Slightly more conspicuous area scarring versus cluster of nodularity within the right upper lobe. 7. Small to moderate volume hiatal hernia. 8. Cholelithiasis no CT finding of acute cholecystitis. 9. Diverticulosis. 10. Uterine fibroids. 11. Aortic Atherosclerosis (ICD10-I70.0) including four-vessel coronary calcification. Electronically Signed: By: Tish Frederickson M.D. On: 06/25/2023 01:34   CT T-SPINE NO CHARGE  Result Date: 06/25/2023 CLINICAL DATA:  SOB and right leg weakness started around noon yesterday. EXAM: CT THORACIC AND LUMBAR SPINE WITHOUT CONTRAST TECHNIQUE: Multidetector CT imaging of the thoracic and lumbar spine was performed without contrast. Multiplanar CT image reconstructions were also generated. RADIATION DOSE REDUCTION: This exam was performed according to the departmental dose-optimization program which includes automated exposure control, adjustment of the mA and/or kV according to patient size and/or use of iterative reconstruction technique. COMPARISON:  CT abdomen pelvis 06/04/2023, CT chest 08/29/2020 FINDINGS: CT THORACIC  SPINE FINDINGS Alignment: Normal. Vertebrae: No acute fracture or focal pathologic process. Paraspinal and other soft tissues: Negative. Disc levels: Couple level intervertebral disc space vacuum phenomenon. Otherwise maintained. CT LUMBAR SPINE FINDINGS Segmentation: 5 lumbar type vertebrae. Alignment: Normal. Vertebrae: Multilevel mild to moderate facet arthropathy. No severe osseous neural foraminal or central canal stenosis. No acute fracture or focal pathologic process. Paraspinal and other soft tissues: Negative. Disc levels: Maintained. Other: Moderate severe osseous neural foraminal stenosis at the left C6-C7 level. IMPRESSION: CT THORACIC SPINE IMPRESSION No acute displaced fracture or traumatic listhesis of the thoracic spine. CT LUMBAR SPINE IMPRESSION No acute displaced fracture or traumatic listhesis of the lumbar spine. Electronically Signed   By: Tish Frederickson M.D.   On: 06/25/2023 01:07   CT L-SPINE NO CHARGE  Result Date: 06/25/2023 CLINICAL DATA:  SOB and right leg weakness started around noon yesterday. EXAM: CT THORACIC AND LUMBAR SPINE WITHOUT CONTRAST TECHNIQUE: Multidetector CT imaging of the thoracic and lumbar spine was performed without contrast. Multiplanar CT image reconstructions were also  generated. RADIATION DOSE REDUCTION: This exam was performed according to the departmental dose-optimization program which includes automated exposure control, adjustment of the mA and/or kV according to patient size and/or use of iterative reconstruction technique. COMPARISON:  CT abdomen pelvis 06/04/2023, CT chest 08/29/2020 FINDINGS: CT THORACIC SPINE FINDINGS Alignment: Normal. Vertebrae: No acute fracture or focal pathologic process. Paraspinal and other soft tissues: Negative. Disc levels: Couple level intervertebral disc space vacuum phenomenon. Otherwise maintained. CT LUMBAR SPINE FINDINGS Segmentation: 5 lumbar type vertebrae. Alignment: Normal. Vertebrae: Multilevel mild to moderate  facet arthropathy. No severe osseous neural foraminal or central canal stenosis. No acute fracture or focal pathologic process. Paraspinal and other soft tissues: Negative. Disc levels: Maintained. Other: Moderate severe osseous neural foraminal stenosis at the left C6-C7 level. IMPRESSION: CT THORACIC SPINE IMPRESSION No acute displaced fracture or traumatic listhesis of the thoracic spine. CT LUMBAR SPINE IMPRESSION No acute displaced fracture or traumatic listhesis of the lumbar spine. Electronically Signed   By: Tish Frederickson M.D.   On: 06/25/2023 01:07   CT HEAD WO CONTRAST ( )  Result Date: 06/25/2023 CLINICAL DATA:  Memory loss dementia, generalized weakness EXAM: CT HEAD WITHOUT CONTRAST TECHNIQUE: Contiguous axial images were obtained from the base of the skull through the vertex without intravenous contrast. RADIATION DOSE REDUCTION: This exam was performed according to the departmental dose-optimization program which includes automated exposure control, adjustment of the mA and/or kV according to patient size and/or use of iterative reconstruction technique. COMPARISON:  CT head 03/30/2021 FINDINGS: Brain: Stable cerebral ventricle sizes are concordant with the degree of cerebral volume loss. Patchy and confluent areas of decreased attenuation are noted throughout the deep and periventricular white matter of the cerebral hemispheres bilaterally, compatible with chronic microvascular ischemic disease. Right occipital lobe encephalomalacia. No evidence of large-territorial acute infarction. No parenchymal hemorrhage. No mass lesion. No extra-axial collection. No mass effect or midline shift. No hydrocephalus. Basilar cisterns are patent. Vascular: No hyperdense vessel. Atherosclerotic calcifications are present within the cavernous internal carotid and vertebral arteries. Skull: No acute fracture or focal lesion. Sinuses/Orbits: Paranasal sinuses and mastoid air cells are clear. Left lens  replacement. Otherwise the orbits are unremarkable. Other: None. IMPRESSION: No acute intracranial abnormality. Electronically Signed   By: Tish Frederickson M.D.   On: 06/25/2023 00:54   DG Chest 2 View  Result Date: 06/24/2023 CLINICAL DATA:  Shortness of breath and extremity weakness EXAM: CHEST - 2 VIEW COMPARISON:  Chest radiograph dated 06/03/2023 FINDINGS: Lines/tubes: Left chest wall pacemaker leads project over the right atrium and ventricle and tributary of the coronary sinus. Lungs: Patient is rotated slightly to the right. Unchanged elevation of the right hemidiaphragm. Slightly low lung volumes. No focal consolidations. Patchy left basilar opacities. Pleura: No pneumothorax or pleural effusion. Heart/mediastinum: The heart size and mediastinal contours are within normal limits. Bones: No acute osseous abnormality. IMPRESSION: Slightly low lung volumes with patchy left basilar opacities, which may represent atelectasis, aspiration, or pneumonia. Electronically Signed   By: Agustin Cree M.D.   On: 06/24/2023 16:33     Subjective: - no chest pain, shortness of breath, no abdominal pain, nausea or vomiting.   Discharge Exam: BP 125/64 (BP Location: Left Arm)   Pulse (!) 59   Temp 98.6 F (37 C)   Resp 17   Ht 5\' 4"  (1.626 m)   Wt 55.8 kg   SpO2 96%   BMI 21.12 kg/m   General: Pt is alert, awake, not in acute distress Cardiovascular: RRR, S1/S2 +,  no rubs, no gallops Respiratory: CTA bilaterally, no wheezing, no rhonchi Abdominal: Soft, NT, ND, bowel sounds + Extremities: no edema, no cyanosis  The results of significant diagnostics from this hospitalization (including imaging, microbiology, ancillary and laboratory) are listed below for reference.     Microbiology: No results found for this or any previous visit (from the past 240 hour(s)).   Labs: Basic Metabolic Panel: Recent Labs  Lab 07/04/23 0543 07/05/23 0609 07/06/23 0603  NA 139 140 142  K 4.4 4.4 4.8  CL 106  110 112*  CO2 21* 21* 20*  GLUCOSE 73 84 80  BUN 58* 56* 49*  CREATININE 4.26* 4.09* 3.78*  CALCIUM 9.5 9.0 9.1  MG 2.3  --   --   PHOS 4.9*  --   --    Liver Function Tests: Recent Labs  Lab 07/04/23 0543  AST 17  ALT 12  ALKPHOS 70  BILITOT 0.7  PROT 6.7  ALBUMIN 3.2*   CBC: Recent Labs  Lab 07/04/23 0543  WBC 8.4  HGB 10.3*  HCT 31.0*  MCV 90.4  PLT 303   CBG: No results for input(s): "GLUCAP" in the last 168 hours. Hgb A1c No results for input(s): "HGBA1C" in the last 72 hours. Lipid Profile No results for input(s): "CHOL", "HDL", "LDLCALC", "TRIG", "CHOLHDL", "LDLDIRECT" in the last 72 hours. Thyroid function studies No results for input(s): "TSH", "T4TOTAL", "T3FREE", "THYROIDAB" in the last 72 hours.  Invalid input(s): "FREET3" Urinalysis    Component Value Date/Time   COLORURINE YELLOW 06/24/2023 1513   APPEARANCEUR HAZY (A) 06/24/2023 1513   LABSPEC 1.011 06/24/2023 1513   PHURINE 6.0 06/24/2023 1513   GLUCOSEU NEGATIVE 06/24/2023 1513   HGBUR NEGATIVE 06/24/2023 1513   BILIRUBINUR NEGATIVE 06/24/2023 1513   KETONESUR NEGATIVE 06/24/2023 1513   PROTEINUR 30 (A) 06/24/2023 1513   UROBILINOGEN 0.2 04/27/2015 0834   NITRITE NEGATIVE 06/24/2023 1513   LEUKOCYTESUR MODERATE (A) 06/24/2023 1513    FURTHER DISCHARGE INSTRUCTIONS:   Get Medicines reviewed and adjusted: Please take all your medications with you for your next visit with your Primary MD   Laboratory/radiological data: Please request your Primary MD to go over all hospital tests and procedure/radiological results at the follow up, please ask your Primary MD to get all Hospital records sent to his/her office.   In some cases, they will be blood work, cultures and biopsy results pending at the time of your discharge. Please request that your primary care M.D. goes through all the records of your hospital data and follows up on these results.   Also Note the following: If you experience  worsening of your admission symptoms, develop shortness of breath, life threatening emergency, suicidal or homicidal thoughts you must seek medical attention immediately by calling 911 or calling your MD immediately  if symptoms less severe.   You must read complete instructions/literature along with all the possible adverse reactions/side effects for all the Medicines you take and that have been prescribed to you. Take any new Medicines after you have completely understood and accpet all the possible adverse reactions/side effects.    Do not drive when taking Pain medications or sleeping medications (Benzodaizepines)   Do not take more than prescribed Pain, Sleep and Anxiety Medications. It is not advisable to combine anxiety,sleep and pain medications without talking with your primary care practitioner   Special Instructions: If you have smoked or chewed Tobacco  in the last 2 yrs please stop smoking, stop any regular Alcohol  and or any Recreational drug use.   Wear Seat belts while driving.   Please note: You were cared for by a hospitalist during your hospital stay. Once you are discharged, your primary care physician will handle any further medical issues. Please note that NO REFILLS for any discharge medications will be authorized once you are discharged, as it is imperative that you return to your primary care physician (or establish a relationship with a primary care physician if you do not have one) for your post hospital discharge needs so that they can reassess your need for medications and monitor your lab values.  Time coordinating discharge: 35 minutes  SIGNED:  Pamella Pert, MD, PhD 07/08/2023, 9:07 AM

## 2023-07-08 NOTE — Plan of Care (Signed)
Adequate for discharge to SNF 

## 2023-07-09 NOTE — Consult Note (Signed)
   [facility]  [facility] Inpatient Consult   07/09/2023  Betty [name] 03/01/1946 [id number]  [facility] [[facility]]  Accountable Care Organization [ACO] Patient: Advertising copywriter Medicare  Primary Care Provider:  Hyacinth Meeker, Mila Merry with [place] at [facility]   Patient was reviewed for less than 30 days readmission extreme high risk score with a 13 day length of stay for barriers to post hospital care.  Patient was screened for hospitalization and on behalf of [facility] /[facility] Care Coordination to assess for post hospital community care needs.  Patient is being considered for a skilled nursing facility level of care for post hospital transition. Patient transitioned to [facility].  If the patient goes to a [facility] affiliated facility then, patient can be followed by [facility] RN with traditional Medicare and approved Medicare Advantage plans.    Plan:   Will notify the [facility] RN can follow for any known or needs for transitional care needs for returning to post facility care coordination needs to return to community.  For questions or referrals, please contact:   Charlesetta Shanks, RN BSN CCM [facility]  [phone] business mobile phone Toll free office [phone]  Fax number: [phone] [email or web address]

## 2023-07-10 ENCOUNTER — Other Ambulatory Visit: Payer: Self-pay | Admitting: *Deleted

## 2023-07-10 DIAGNOSIS — K219 Gastro-esophageal reflux disease without esophagitis: Secondary | ICD-10-CM | POA: Diagnosis not present

## 2023-07-10 DIAGNOSIS — M6281 Muscle weakness (generalized): Secondary | ICD-10-CM | POA: Diagnosis not present

## 2023-07-10 NOTE — Patient Outreach (Signed)
Ms. Llera resides in [facility] skilled nursing facility. Screening for potential care coordination services as benefit of health plan and Primary Care Provider.   Collaboration with [facility] social worker to make aware Clinical research associate is following for potential care coordination needs and transition plans.   [name], MSN, RN,BSN Post Acute Care Coordinator [phone] (Direct dial)

## 2023-07-11 DIAGNOSIS — I5042 Chronic combined systolic (congestive) and diastolic (congestive) heart failure: Secondary | ICD-10-CM | POA: Diagnosis not present

## 2023-07-11 DIAGNOSIS — K3532 Acute appendicitis with perforation and localized peritonitis, without abscess: Secondary | ICD-10-CM | POA: Diagnosis not present

## 2023-07-11 DIAGNOSIS — R2689 Other abnormalities of gait and mobility: Secondary | ICD-10-CM | POA: Diagnosis not present

## 2023-07-11 DIAGNOSIS — M6281 Muscle weakness (generalized): Secondary | ICD-10-CM | POA: Diagnosis not present

## 2023-07-12 ENCOUNTER — Other Ambulatory Visit: Payer: Self-pay

## 2023-07-12 MED ORDER — ATORVASTATIN CALCIUM 40 MG PO TABS: ORAL_TABLET | ORAL | 3 refills | Status: DC

## 2023-07-15 DIAGNOSIS — K3532 Acute appendicitis with perforation and localized peritonitis, without abscess: Secondary | ICD-10-CM | POA: Diagnosis not present

## 2023-07-15 DIAGNOSIS — R2689 Other abnormalities of gait and mobility: Secondary | ICD-10-CM | POA: Diagnosis not present

## 2023-07-15 DIAGNOSIS — I5042 Chronic combined systolic (congestive) and diastolic (congestive) heart failure: Secondary | ICD-10-CM | POA: Diagnosis not present

## 2023-07-15 DIAGNOSIS — R911 Solitary pulmonary nodule: Secondary | ICD-10-CM | POA: Diagnosis not present

## 2023-07-15 DIAGNOSIS — M6281 Muscle weakness (generalized): Secondary | ICD-10-CM | POA: Diagnosis not present

## 2023-07-16 DIAGNOSIS — I10 Essential (primary) hypertension: Secondary | ICD-10-CM | POA: Diagnosis not present

## 2023-07-18 ENCOUNTER — Ambulatory Visit: Payer: Medicare Other | Admitting: Podiatry

## 2023-07-18 DIAGNOSIS — I5042 Chronic combined systolic (congestive) and diastolic (congestive) heart failure: Secondary | ICD-10-CM | POA: Diagnosis not present

## 2023-07-18 DIAGNOSIS — K3532 Acute appendicitis with perforation and localized peritonitis, without abscess: Secondary | ICD-10-CM | POA: Diagnosis not present

## 2023-07-18 DIAGNOSIS — M6281 Muscle weakness (generalized): Secondary | ICD-10-CM | POA: Diagnosis not present

## 2023-07-18 DIAGNOSIS — R2689 Other abnormalities of gait and mobility: Secondary | ICD-10-CM | POA: Diagnosis not present

## 2023-07-22 DIAGNOSIS — I5042 Chronic combined systolic (congestive) and diastolic (congestive) heart failure: Secondary | ICD-10-CM | POA: Diagnosis not present

## 2023-07-22 DIAGNOSIS — E785 Hyperlipidemia, unspecified: Secondary | ICD-10-CM | POA: Diagnosis not present

## 2023-07-22 DIAGNOSIS — M6281 Muscle weakness (generalized): Secondary | ICD-10-CM | POA: Diagnosis not present

## 2023-07-22 DIAGNOSIS — E559 Vitamin D deficiency, unspecified: Secondary | ICD-10-CM | POA: Diagnosis not present

## 2023-07-22 DIAGNOSIS — K3532 Acute appendicitis with perforation and localized peritonitis, without abscess: Secondary | ICD-10-CM | POA: Diagnosis not present

## 2023-07-22 DIAGNOSIS — R2689 Other abnormalities of gait and mobility: Secondary | ICD-10-CM | POA: Diagnosis not present

## 2023-07-24 ENCOUNTER — Other Ambulatory Visit: Payer: Self-pay | Admitting: *Deleted

## 2023-07-24 DIAGNOSIS — E440 Moderate protein-calorie malnutrition: Secondary | ICD-10-CM | POA: Diagnosis not present

## 2023-07-24 DIAGNOSIS — N184 Chronic kidney disease, stage 4 (severe): Secondary | ICD-10-CM | POA: Diagnosis not present

## 2023-07-24 NOTE — Patient Outreach (Signed)
Post- Acute Care Coordinator follow up. Betty [name] resides in  [facility] skilled nursing facility.  Screening for potential care coordination/ chronic care management services as a benefit of health plan and primary care provider.  Collaboration with [name] psychotherapist. Betty [name] is in second level appeal currently. Not safe to return home alone currently.   PCP office [place] [facility] has care management services.  [name], MSN, RN, BSN [place]  [facility] RN Post- Acute Care Coordinator Direct Dial: [phone]

## 2023-07-25 DIAGNOSIS — K3532 Acute appendicitis with perforation and localized peritonitis, without abscess: Secondary | ICD-10-CM | POA: Diagnosis not present

## 2023-07-25 DIAGNOSIS — I5042 Chronic combined systolic (congestive) and diastolic (congestive) heart failure: Secondary | ICD-10-CM | POA: Diagnosis not present

## 2023-07-25 DIAGNOSIS — R2689 Other abnormalities of gait and mobility: Secondary | ICD-10-CM | POA: Diagnosis not present

## 2023-07-25 DIAGNOSIS — M6281 Muscle weakness (generalized): Secondary | ICD-10-CM | POA: Diagnosis not present

## 2023-07-27 DIAGNOSIS — K3532 Acute appendicitis with perforation and localized peritonitis, without abscess: Secondary | ICD-10-CM | POA: Diagnosis not present

## 2023-07-27 DIAGNOSIS — N184 Chronic kidney disease, stage 4 (severe): Secondary | ICD-10-CM | POA: Diagnosis not present

## 2023-07-27 DIAGNOSIS — E440 Moderate protein-calorie malnutrition: Secondary | ICD-10-CM | POA: Diagnosis not present

## 2023-07-27 DIAGNOSIS — E785 Hyperlipidemia, unspecified: Secondary | ICD-10-CM | POA: Diagnosis not present

## 2023-07-28 ENCOUNTER — Observation Stay (HOSPITAL_COMMUNITY)
Admission: EM | Admit: 2023-07-28 | Discharge: 2023-07-29 | Disposition: A | Discharge status: Skilled Nursing Facility | Payer: Medicare Other | Attending: Student in an Organized Health Care Education/Training Program | Admitting: Student in an Organized Health Care Education/Training Program

## 2023-07-28 ENCOUNTER — Emergency Department (HOSPITAL_COMMUNITY): Payer: Medicare Other

## 2023-07-28 ENCOUNTER — Encounter (HOSPITAL_COMMUNITY): Payer: Self-pay | Admitting: Family Medicine

## 2023-07-28 DIAGNOSIS — W19XXXA Unspecified fall, initial encounter: Secondary | ICD-10-CM | POA: Diagnosis present

## 2023-07-28 DIAGNOSIS — I130 Hypertensive heart and chronic kidney disease with heart failure and stage 1 through stage 4 chronic kidney disease, or unspecified chronic kidney disease: Secondary | ICD-10-CM | POA: Insufficient documentation

## 2023-07-28 DIAGNOSIS — I428 Other cardiomyopathies: Secondary | ICD-10-CM

## 2023-07-28 DIAGNOSIS — N184 Chronic kidney disease, stage 4 (severe): Secondary | ICD-10-CM | POA: Insufficient documentation

## 2023-07-28 DIAGNOSIS — S199XXA Unspecified injury of neck, initial encounter: Secondary | ICD-10-CM | POA: Diagnosis not present

## 2023-07-28 DIAGNOSIS — M549 Dorsalgia, unspecified: Secondary | ICD-10-CM | POA: Diagnosis not present

## 2023-07-28 DIAGNOSIS — I5042 Chronic combined systolic (congestive) and diastolic (congestive) heart failure: Secondary | ICD-10-CM | POA: Insufficient documentation

## 2023-07-28 DIAGNOSIS — Z79899 Other long term (current) drug therapy: Secondary | ICD-10-CM | POA: Diagnosis not present

## 2023-07-28 DIAGNOSIS — F0390 Unspecified dementia without behavioral disturbance: Secondary | ICD-10-CM | POA: Diagnosis present

## 2023-07-28 DIAGNOSIS — J189 Pneumonia, unspecified organism: Secondary | ICD-10-CM | POA: Diagnosis not present

## 2023-07-28 DIAGNOSIS — R918 Other nonspecific abnormal finding of lung field: Secondary | ICD-10-CM | POA: Diagnosis not present

## 2023-07-28 DIAGNOSIS — S0990XA Unspecified injury of head, initial encounter: Secondary | ICD-10-CM | POA: Diagnosis not present

## 2023-07-28 DIAGNOSIS — I517 Cardiomegaly: Secondary | ICD-10-CM | POA: Diagnosis not present

## 2023-07-28 DIAGNOSIS — Z950 Presence of cardiac pacemaker: Secondary | ICD-10-CM | POA: Diagnosis not present

## 2023-07-28 DIAGNOSIS — F01B0 Vascular dementia, moderate, without behavioral disturbance, psychotic disturbance, mood disturbance, and anxiety: Secondary | ICD-10-CM | POA: Diagnosis not present

## 2023-07-28 DIAGNOSIS — Y92009 Unspecified place in unspecified non-institutional (private) residence as the place of occurrence of the external cause: Secondary | ICD-10-CM

## 2023-07-28 DIAGNOSIS — J449 Chronic obstructive pulmonary disease, unspecified: Secondary | ICD-10-CM | POA: Diagnosis not present

## 2023-07-28 DIAGNOSIS — I700 Atherosclerosis of aorta: Secondary | ICD-10-CM | POA: Diagnosis not present

## 2023-07-28 DIAGNOSIS — I2510 Atherosclerotic heart disease of native coronary artery without angina pectoris: Secondary | ICD-10-CM | POA: Diagnosis present

## 2023-07-28 DIAGNOSIS — C3491 Malignant neoplasm of unspecified part of right bronchus or lung: Secondary | ICD-10-CM | POA: Diagnosis not present

## 2023-07-28 DIAGNOSIS — R0682 Tachypnea, not elsewhere classified: Secondary | ICD-10-CM | POA: Diagnosis not present

## 2023-07-28 DIAGNOSIS — Z7982 Long term (current) use of aspirin: Secondary | ICD-10-CM | POA: Insufficient documentation

## 2023-07-28 DIAGNOSIS — R9089 Other abnormal findings on diagnostic imaging of central nervous system: Secondary | ICD-10-CM | POA: Diagnosis not present

## 2023-07-28 DIAGNOSIS — M542 Cervicalgia: Secondary | ICD-10-CM | POA: Diagnosis present

## 2023-07-28 DIAGNOSIS — Z743 Need for continuous supervision: Secondary | ICD-10-CM | POA: Diagnosis not present

## 2023-07-28 DIAGNOSIS — Z87891 Personal history of nicotine dependence: Secondary | ICD-10-CM | POA: Diagnosis not present

## 2023-07-28 DIAGNOSIS — I6523 Occlusion and stenosis of bilateral carotid arteries: Secondary | ICD-10-CM | POA: Diagnosis not present

## 2023-07-28 LAB — COMPREHENSIVE METABOLIC PANEL
ALT: 26 U/L (ref 0–44)
AST: 37 U/L (ref 15–41)
Albumin: 2.4 g/dL — ABNORMAL LOW (ref 3.5–5.0)
Alkaline Phosphatase: 71 U/L (ref 38–126)
Anion gap: 12 (ref 5–15)
BUN: 56 mg/dL — ABNORMAL HIGH (ref 8–23)
CO2: 17 mmol/L — ABNORMAL LOW (ref 22–32)
Calcium: 8.4 mg/dL — ABNORMAL LOW (ref 8.9–10.3)
Chloride: 114 mmol/L — ABNORMAL HIGH (ref 98–111)
Creatinine, Ser: 3.07 mg/dL — ABNORMAL HIGH (ref 0.44–1.00)
GFR, Estimated: 15 mL/min — ABNORMAL LOW (ref >60)
Glucose, Bld: 86 mg/dL (ref 70–99)
Potassium: 4.2 mmol/L (ref 3.5–5.1)
Sodium: 143 mmol/L (ref 135–145)
Total Bilirubin: 0.3 mg/dL (ref 0.3–1.2)
Total Protein: 6.4 g/dL — ABNORMAL LOW (ref 6.5–8.1)

## 2023-07-28 LAB — CBC WITH DIFFERENTIAL/PLATELET
Abs Immature Granulocytes: 0.08 10*3/uL — ABNORMAL HIGH (ref 0.00–0.07)
Basophils Absolute: 0.1 10*3/uL (ref 0.0–0.1)
Basophils Relative: 0 %
Eosinophils Absolute: 0.1 10*3/uL (ref 0.0–0.5)
Eosinophils Relative: 1 %
HCT: 29.5 % — ABNORMAL LOW (ref 36.0–46.0)
Hemoglobin: 9.4 g/dL — ABNORMAL LOW (ref 12.0–15.0)
Immature Granulocytes: 1 %
Lymphocytes Relative: 20 %
Lymphs Abs: 2.3 10*3/uL (ref 0.7–4.0)
MCH: 28.9 pg (ref 26.0–34.0)
MCHC: 31.9 g/dL (ref 30.0–36.0)
MCV: 90.8 fL (ref 80.0–100.0)
Monocytes Absolute: 0.7 10*3/uL (ref 0.1–1.0)
Monocytes Relative: 7 %
Neutro Abs: 8 10*3/uL — ABNORMAL HIGH (ref 1.7–7.7)
Neutrophils Relative %: 71 %
Platelets: 405 10*3/uL — ABNORMAL HIGH (ref 150–400)
RBC: 3.25 MIL/uL — ABNORMAL LOW (ref 3.87–5.11)
RDW: 13.5 % (ref 11.5–15.5)
WBC: 11.2 10*3/uL — ABNORMAL HIGH (ref 4.0–10.5)
nRBC: 0 % (ref 0.0–0.2)

## 2023-07-28 LAB — I-STAT VENOUS BLOOD GAS, ED
Acid-base deficit: 2 mmol/L (ref 0.0–2.0)
Bicarbonate: 22.4 mmol/L (ref 20.0–28.0)
Calcium, Ion: 1.13 mmol/L — ABNORMAL LOW (ref 1.15–1.40)
HCT: 31 % — ABNORMAL LOW (ref 36.0–46.0)
Hemoglobin: 10.5 g/dL — ABNORMAL LOW (ref 12.0–15.0)
O2 Saturation: 72 %
Potassium: 4.6 mmol/L (ref 3.5–5.1)
Sodium: 147 mmol/L — ABNORMAL HIGH (ref 135–145)
TCO2: 23 mmol/L (ref 22–32)
pCO2, Ven: 36.4 mmHg — ABNORMAL LOW (ref 44–60)
pH, Ven: 7.397 (ref 7.25–7.43)
pO2, Ven: 38 mmHg (ref 32–45)

## 2023-07-28 LAB — I-STAT CG4 LACTIC ACID, ED: Lactic Acid, Venous: 2.3 mmol/L (ref 0.5–1.9)

## 2023-07-28 MED ORDER — SODIUM CHLORIDE 0.9 % IV SOLN
1.0000 g | Freq: Once | INTRAVENOUS | Status: AC
  Administered 2023-07-28: 1 g via INTRAVENOUS
  Filled 2023-07-28: qty 10

## 2023-07-28 MED ORDER — IPRATROPIUM-ALBUTEROL 0.5-2.5 (3) MG/3ML IN SOLN: 3.0000 mL | RESPIRATORY_TRACT | Status: DC | PRN

## 2023-07-28 MED ORDER — MEMANTINE HCL 10 MG PO TABS
10.0000 mg | ORAL_TABLET | Freq: Two times a day (BID) | ORAL | Status: DC
  Administered 2023-07-29: 10 mg via ORAL
  Filled 2023-07-28: qty 1

## 2023-07-28 MED ORDER — HEPARIN SODIUM (PORCINE) 5000 UNIT/ML IJ SOLN
5000.0000 [IU] | Freq: Three times a day (TID) | INTRAMUSCULAR | Status: DC
  Filled 2023-07-28: qty 1

## 2023-07-28 MED ORDER — DONEPEZIL HCL 10 MG PO TABS: 10.0000 mg | ORAL_TABLET | Freq: Every day | ORAL | Status: DC

## 2023-07-28 MED ORDER — SODIUM BICARBONATE 650 MG PO TABS
650.0000 mg | ORAL_TABLET | Freq: Two times a day (BID) | ORAL | Status: DC
  Administered 2023-07-29: 650 mg via ORAL
  Filled 2023-07-28: qty 1

## 2023-07-28 MED ORDER — SODIUM CHLORIDE 0.9 % IV SOLN: 2.0000 g | INTRAVENOUS | Status: DC

## 2023-07-28 MED ORDER — PANTOPRAZOLE SODIUM 40 MG PO TBEC
40.0000 mg | DELAYED_RELEASE_TABLET | Freq: Every morning | ORAL | Status: DC
  Administered 2023-07-29: 40 mg via ORAL
  Filled 2023-07-28: qty 1

## 2023-07-28 MED ORDER — ASPIRIN 81 MG PO TBEC: 81.0000 mg | DELAYED_RELEASE_TABLET | Freq: Every day | ORAL | Status: DC

## 2023-07-28 MED ORDER — AZITHROMYCIN 500 MG PO TABS: 500.0000 mg | ORAL_TABLET | Freq: Every day | ORAL | Status: DC

## 2023-07-28 MED ORDER — ACETAMINOPHEN 650 MG RE SUPP: 650.0000 mg | Freq: Four times a day (QID) | RECTAL | Status: DC | PRN

## 2023-07-28 MED ORDER — SENNOSIDES-DOCUSATE SODIUM 8.6-50 MG PO TABS: 1.0000 | ORAL_TABLET | Freq: Every evening | ORAL | Status: DC | PRN

## 2023-07-28 MED ORDER — LACTATED RINGERS IV BOLUS
500.0000 mL | Freq: Once | INTRAVENOUS | Status: AC
  Administered 2023-07-28: 500 mL via INTRAVENOUS

## 2023-07-28 MED ORDER — ISOSORBIDE DINITRATE 20 MG PO TABS
20.0000 mg | ORAL_TABLET | Freq: Three times a day (TID) | ORAL | Status: DC
  Administered 2023-07-29: 20 mg via ORAL
  Filled 2023-07-28 (×3): qty 1

## 2023-07-28 MED ORDER — METOPROLOL SUCCINATE ER 50 MG PO TB24
50.0000 mg | ORAL_TABLET | Freq: Every day | ORAL | Status: DC
  Administered 2023-07-29: 50 mg via ORAL
  Filled 2023-07-28: qty 1

## 2023-07-28 MED ORDER — GUAIFENESIN 100 MG/5ML PO LIQD: 5.0000 mL | ORAL | Status: DC | PRN

## 2023-07-28 MED ORDER — ATORVASTATIN CALCIUM 40 MG PO TABS: 40.0000 mg | ORAL_TABLET | Freq: Every day | ORAL | Status: DC

## 2023-07-28 MED ORDER — QUETIAPINE FUMARATE 25 MG PO TABS: 25.0000 mg | ORAL_TABLET | Freq: Every day | ORAL | Status: DC

## 2023-07-28 MED ORDER — ACETAMINOPHEN 325 MG PO TABS: 650.0000 mg | ORAL_TABLET | Freq: Four times a day (QID) | ORAL | Status: DC | PRN

## 2023-07-28 MED ORDER — SODIUM CHLORIDE 0.9 % IV SOLN
500.0000 mg | Freq: Once | INTRAVENOUS | Status: AC
  Administered 2023-07-28: 500 mg via INTRAVENOUS
  Filled 2023-07-28: qty 5

## 2023-07-28 NOTE — H&P (Signed)
History and Physical    Betty [name]:[id number] DOB: [birth year] DOA: 07/28/2023  PCP: Collene Mares, PA   Patient coming from: SNF   Chief Complaint: Fall   HPI: Betty [name] is a 77 y.o. female with medical history significant for COPD, dementia, CKD stage IV, chronic combined systolic and diastolic CHF, lung cancer, and recent admission for perforated appendicitis that was managed conservatively who now presents from her SNF after an unwitnessed fall.  Patient does not remember the fall and has no acute complaints.  She is oriented to person, knows that she is in a hospital, but unable to provide much history at all.  She is at her neurologic baseline per report of SNF personnel.  ED Course: Upon arrival to the ED, patient is found to be afebrile and saturating well on room air with tachypnea, normal heart rate, and stable blood pressure.  Labs are most notable for WBC 11,600, platelets 405,000, lactic acid 2.3, serum bicarbonate 17, and creatinine 3.07.  No acute findings are noted on CT of the head or cervical spine.  There is an opacity in the left lung on chest x-ray.  Patient was given 500 mL of LR, Rocephin, and azithromycin in the ED.  Review of Systems:  ROS limited by patient's clinical condition.  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)    Arthritis    bursitis in right shoulder (05/21/2018)   Blood transfusion without reported diagnosis    Cerebral aneurysm, nonruptured 07/16/2018   Right anterior communicating artery   CKD (chronic kidney disease), stage IV (HCC)    Colon polyps    COPD (chronic obstructive pulmonary disease) (HCC)    emphysema   Dementia (HCC) 07/16/2018   GERD (gastroesophageal reflux disease)    Hypercholesterolemia    Hypertension    Non-small cell carcinoma of right lung, stage 1 (HCC) 05/17/2015   Stage IB, right upper lobectomy 04/29/15, chemo    NSTEMI (non-ST elevated  myocardial infarction) (HCC) 03/24/2018   Presence of permanent cardiac pacemaker 05/21/2018   Pure hypercholesterolemia    Sciatica of right side    Seasonal allergic rhinitis    Tobacco dependence    Tubular adenoma of colon    Vitamin D deficiency     Past Surgical History:  Procedure Laterality Date   BIOPSY  06/26/2019   Procedure: BIOPSY;  Surgeon: Charlott Rakes, MD;  Location: [facility] ENDOSCOPY;  Service: Endoscopy;;   BIV PACEMAKER INSERTION CRT-P N/A 05/21/2018   Procedure: BIV PACEMAKER INSERTION CRT-P;  Surgeon: Duke Salvia, MD;  Location: [facility] INVASIVE CV LAB;  Service: Cardiovascular;  Laterality: N/A;   BREAST SURGERY     small mass removed from left breast--benign   CRYO INTERCOSTAL NERVE BLOCK Right 04/29/2015   Procedure: CRYO INTERCOSTAL NERVE BLOCK;  Surgeon: Loreli Slot, MD;  Location: [facility] OR;  Service: Thoracic;  Laterality: Right;   ESOPHAGOGASTRODUODENOSCOPY (EGD) WITH PROPOFOL N/A 06/26/2019   Procedure: ESOPHAGOGASTRODUODENOSCOPY (EGD) WITH PROPOFOL;  Surgeon: Charlott Rakes, MD;  Location: [facility] ENDOSCOPY;  Service: Endoscopy;  Laterality: N/A;   INSERT / REPLACE / REMOVE PACEMAKER  05/21/2018   LOBECTOMY Right 04/29/2015   Procedure: RIGHT LOWER LUNG LOBECTOMY ;  Surgeon: Loreli Slot, MD;  Location: [facility] OR;  Service: Thoracic;  Laterality: Right;   LYMPH NODE DISSECTION Right 04/29/2015   Procedure: RIGHT LUNG LYMPH NODE DISSECTION;  Surgeon: Loreli Slot, MD;  Location: [facility] OR;  Service: Thoracic;  Laterality: Right;   RIGHT/LEFT HEART CATH AND CORONARY ANGIOGRAPHY N/A 04/24/2018   Procedure: RIGHT/LEFT HEART CATH AND CORONARY ANGIOGRAPHY;  Surgeon: Runell Gess, MD;  Location: [facility] INVASIVE CV LAB;  Service: Cardiovascular;  Laterality: N/A;   VAGINAL DELIVERY     51 yrs ago   VIDEO ASSISTED THORACOSCOPY (VATS)/ LOBECTOMY Right 04/29/2015   Procedure: RIGHT VIDEO ASSISTED THORACOSCOPY (VATS) WEDGE RESECTION/ RIGHT LOWER LOBECTOMY,  CRYO-ANALGESIA OF INTERCOSTAL NERVES;  Surgeon: Loreli Slot, MD;  Location: [facility] OR;  Service: Thoracic;  Laterality: Right;    Social History:   reports that she quit smoking about 8 years ago. Her smoking use included cigarettes. She started smoking about 55 years ago. She has a 35.3 pack-year smoking history. She has been exposed to tobacco smoke. She has never used smokeless tobacco. She reports current alcohol use. She reports that she does not use drugs.  Allergies  Allergen Reactions   Sulfa Antibiotics Itching    Family History  Problem Relation Age of Onset   Cancer Father    Cancer Sister    Heart attack Brother    Dementia Brother    Other Son        bicuspid aortic valve   Heart attack Brother    Cancer Sister      Prior to Admission medications   Medication Sig Start Date End Date Taking? Authorizing Provider  acetaminophen (TYLENOL) 325 MG tablet Take 650 mg by mouth every 6 (six) hours as needed for mild pain or moderate pain.    [provider]  aspirin EC 81 MG tablet Take 81 mg by mouth at bedtime.    [provider]  atorvastatin (LIPITOR) 40 MG tablet TAKE 1 TABLET BY MOUTH EVERY DAY AT 6 PM 07/12/23   Duke Salvia, MD  cetirizine (ZYRTEC) 10 MG tablet Take 10 mg by mouth daily.    [provider]  Cholecalciferol (VITAMIN D) 2000 units tablet Take 1,000 Units by mouth at bedtime.     [provider]  donepezil (ARICEPT) 10 MG tablet Take 1 tablet (10 mg total) by mouth at bedtime. 12/14/21   Glean Salvo, NP  febuxostat (ULORIC) 40 MG tablet Take 40 mg by mouth at bedtime. 03/27/23   [provider]  isosorbide dinitrate (ISORDIL) 20 MG tablet TAKE 1 TABLET BY MOUTH THREE TIMES DAILY 08/08/22   Lyn Records, MD  memantine (NAMENDA) 10 MG tablet Take 1 tablet (10 mg total) by mouth 2 (two) times daily. 06/28/22   Glean Salvo, NP  metoprolol succinate (TOPROL-XL) 50 MG 24 hr tablet Take 1 tablet (50 mg total)  by mouth daily. Take with or immediately following a meal. 06/10/23   Meriam Sprague, MD  Multiple Vitamin (MULTIVITAMIN WITH MINERALS) TABS tablet Take 0.5 tablets by mouth at bedtime.    [provider]  nitroGLYCERIN (NITROSTAT) 0.4 MG SL tablet Place 1 tablet (0.4 mg total) under the tongue every 5 (five) minutes x 3 doses as needed for chest pain. Patient not taking: Reported on 06/25/2023 04/26/18   Berton Bon, NP  pantoprazole (PROTONIX) 40 MG tablet Take 40 mg by mouth every morning. 03/14/23   [provider]  QUEtiapine (SEROQUEL) 25 MG tablet Take 25 mg by mouth at bedtime. 05/28/23   [provider]  sodium bicarbonate 650 MG tablet Take 1 tablet (650 mg total) by mouth 2 (two) times daily. 07/02/19   Merlene Laughter, DO  Physical Exam: Vitals:   07/28/23 1930 07/28/23 1945 07/28/23 2000 07/28/23 2015  BP: 133/67 115/69 123/66 122/67  Pulse:  73    Resp: 16 (!) 21 17 15   Temp:      TempSrc:      SpO2:  97%      Constitutional: NAD, no pallor or cyanosis   Eyes: PERTLA, lids and conjunctivae normal ENMT: Mucous membranes are moist. Posterior pharynx clear of any exudate or lesions.   Neck: supple, no masses  Respiratory: Rhonchi on left. No wheezing. Mild tachypnea. Frequent cough. No accessory muscle use.  Cardiovascular: S1 & S2 heard, regular rate and rhythm. No extremity edema.   Abdomen: No distension, no tenderness, soft. Bowel sounds active.  Musculoskeletal: no clubbing / cyanosis. No joint deformity upper and lower extremities.   Skin: no significant rashes, lesions, ulcers. Warm, dry, well-perfused. Neurologic: CN 2-12 grossly intact. Moving all extremities. Alert and oriented x2.  Psychiatric: Calm. Cooperative.    Labs and Imaging on Admission: I have personally reviewed following labs and imaging studies  CBC: Recent Labs  Lab 07/28/23 1743 07/28/23 1940  WBC 11.2*  --   NEUTROABS 8.0*  --   HGB 9.4* 10.5*  HCT  29.5* 31.0*  MCV 90.8  --   PLT 405*  --    Basic Metabolic Panel: Recent Labs  Lab 07/28/23 1743 07/28/23 1940  NA 143 147*  K 4.2 4.6  CL 114*  --   CO2 17*  --   GLUCOSE 86  --   BUN 56*  --   CREATININE 3.07*  --   CALCIUM 8.4*  --    GFR: CrCl cannot be calculated (Unknown ideal weight.). Liver Function Tests: Recent Labs  Lab 07/28/23 1743  AST 37  ALT 26  ALKPHOS 71  BILITOT 0.3  PROT 6.4*  ALBUMIN 2.4*   No results for input(s): "LIPASE", "AMYLASE" in the last 168 hours. No results for input(s): "AMMONIA" in the last 168 hours. Coagulation Profile: No results for input(s): "INR", "PROTIME" in the last 168 hours. Cardiac Enzymes: No results for input(s): "CKTOTAL", "CKMB", "CKMBINDEX", "TROPONINI" in the last 168 hours. BNP (last 3 results) No results for input(s): "PROBNP" in the last 8760 hours. HbA1C: No results for input(s): "HGBA1C" in the last 72 hours. CBG: No results for input(s): "GLUCAP" in the last 168 hours. Lipid Profile: No results for input(s): "CHOL", "HDL", "LDLCALC", "TRIG", "CHOLHDL", "LDLDIRECT" in the last 72 hours. Thyroid Function Tests: No results for input(s): "TSH", "T4TOTAL", "FREET4", "T3FREE", "THYROIDAB" in the last 72 hours. Anemia Panel: No results for input(s): "VITAMINB12", "FOLATE", "FERRITIN", "TIBC", "IRON", "RETICCTPCT" in the last 72 hours. Urine analysis:    Component Value Date/Time   COLORURINE YELLOW 06/24/2023 1513   APPEARANCEUR HAZY (A) 06/24/2023 1513   LABSPEC 1.011 06/24/2023 1513   PHURINE 6.0 06/24/2023 1513   GLUCOSEU NEGATIVE 06/24/2023 1513   HGBUR NEGATIVE 06/24/2023 1513   BILIRUBINUR NEGATIVE 06/24/2023 1513   KETONESUR NEGATIVE 06/24/2023 1513   PROTEINUR 30 (A) 06/24/2023 1513   UROBILINOGEN 0.2 04/27/2015 0834   NITRITE NEGATIVE 06/24/2023 1513   LEUKOCYTESUR MODERATE (A) 06/24/2023 1513   Sepsis Labs: @LABRCNTIP (procalcitonin:4,lacticidven:4) )No results found for this or any  previous visit (from the past 240 hour(s)).   Radiological Exams on Admission: CT Head Wo Contrast  Result Date: 07/28/2023 CLINICAL DATA:  Head trauma, minor (Age >= 65y); Neck trauma (Age >= 65y) EXAM: CT HEAD WITHOUT CONTRAST CT CERVICAL SPINE WITHOUT CONTRAST TECHNIQUE: Multidetector  CT imaging of the head and cervical spine was performed following the standard protocol without intravenous contrast. Multiplanar CT image reconstructions of the cervical spine were also generated. RADIATION DOSE REDUCTION: This exam was performed according to the departmental dose-optimization program which includes automated exposure control, adjustment of the mA and/or kV according to patient size and/or use of iterative reconstruction technique. COMPARISON:  CT head 01/28/2021, CT head 06/25/2023 FINDINGS: CT HEAD FINDINGS Brain: Cerebral ventricle sizes are concordant with the degree of cerebral volume loss. Patchy and confluent areas of decreased attenuation are noted throughout the deep and periventricular white matter of the cerebral hemispheres bilaterally, compatible with chronic microvascular ischemic disease. No evidence of large-territorial acute infarction. No parenchymal hemorrhage. No mass lesion. No extra-axial collection. No mass effect or midline shift. No hydrocephalus. Basilar cisterns are patent. Vascular: No hyperdense vessel. Skull: No acute fracture or focal lesion. Sinuses/Orbits: Paranasal sinuses and mastoid air cells are clear. The orbits are unremarkable. Other: None. CT CERVICAL SPINE FINDINGS Alignment: Grade 1 anterolisthesis of C2 on C3. Grade 1 anterolisthesis of C4 on C5. Reversal of the normal cervical lordosis centered at the C4-C5 level likely due to positioning and degenerative changes. Skull base and vertebrae: Multilevel moderate degenerative changes spine. Posterior disc osteophyte complex formation at the C3-C4 and C4-C5 levels. No associated severe osseous neural foraminal or central  canal stenosis. No acute fracture. No aggressive appearing focal osseous lesion or focal pathologic process. Soft tissues and spinal canal: No prevertebral fluid or swelling. No visible canal hematoma. Upper chest: Biapical pleural/pulmonary scarring. Other: Atherosclerotic plaque of the carotid arteries within the neck. IMPRESSION: 1. No acute intracranial abnormality. 2. No acute displaced fracture or traumatic listhesis of the cervical spine. Electronically Signed   By: Tish Frederickson M.D.   On: 07/28/2023 19:17   CT Cervical Spine Wo Contrast  Result Date: 07/28/2023 CLINICAL DATA:  Head trauma, minor (Age >= 65y); Neck trauma (Age >= 65y) EXAM: CT HEAD WITHOUT CONTRAST CT CERVICAL SPINE WITHOUT CONTRAST TECHNIQUE: Multidetector CT imaging of the head and cervical spine was performed following the standard protocol without intravenous contrast. Multiplanar CT image reconstructions of the cervical spine were also generated. RADIATION DOSE REDUCTION: This exam was performed according to the departmental dose-optimization program which includes automated exposure control, adjustment of the mA and/or kV according to patient size and/or use of iterative reconstruction technique. COMPARISON:  CT head 01/28/2021, CT head 06/25/2023 FINDINGS: CT HEAD FINDINGS Brain: Cerebral ventricle sizes are concordant with the degree of cerebral volume loss. Patchy and confluent areas of decreased attenuation are noted throughout the deep and periventricular white matter of the cerebral hemispheres bilaterally, compatible with chronic microvascular ischemic disease. No evidence of large-territorial acute infarction. No parenchymal hemorrhage. No mass lesion. No extra-axial collection. No mass effect or midline shift. No hydrocephalus. Basilar cisterns are patent. Vascular: No hyperdense vessel. Skull: No acute fracture or focal lesion. Sinuses/Orbits: Paranasal sinuses and mastoid air cells are clear. The orbits are  unremarkable. Other: None. CT CERVICAL SPINE FINDINGS Alignment: Grade 1 anterolisthesis of C2 on C3. Grade 1 anterolisthesis of C4 on C5. Reversal of the normal cervical lordosis centered at the C4-C5 level likely due to positioning and degenerative changes. Skull base and vertebrae: Multilevel moderate degenerative changes spine. Posterior disc osteophyte complex formation at the C3-C4 and C4-C5 levels. No associated severe osseous neural foraminal or central canal stenosis. No acute fracture. No aggressive appearing focal osseous lesion or focal pathologic process. Soft tissues and spinal canal: No  prevertebral fluid or swelling. No visible canal hematoma. Upper chest: Biapical pleural/pulmonary scarring. Other: Atherosclerotic plaque of the carotid arteries within the neck. IMPRESSION: 1. No acute intracranial abnormality. 2. No acute displaced fracture or traumatic listhesis of the cervical spine. Electronically Signed   By: Tish Frederickson M.D.   On: 07/28/2023 19:17   DG Chest Port 1 View  Result Date: 07/28/2023 CLINICAL DATA:  Tachypnea EXAM: PORTABLE CHEST 1 VIEW COMPARISON:  06/24/2023, 06/03/2023, 03/30/2021 FINDINGS: Left-sided multi lead pacing device as before. Cardiomegaly without acute airspace disease, pleural effusion or pneumothorax. Interstitial opacities in the left mid to lower lung. Aortic atherosclerosis. IMPRESSION: Cardiomegaly. Mild reticular interstitial opacities in the left mid to lower lung, question atypical infection versus chronic interstitial change. Electronically Signed   By: Jasmine Pang M.D.   On: 07/28/2023 18:24     Assessment/Plan   1. Pneumonia  - Presents after unwitnessed fall, has frequent cough and rhonchi on exam, mild leukocytosis, and new opacity on CXR concerning for pneumonia  - Rocephin and azithromycin started in ED  - Check strep pneumo and legionella antigens, check/trend procalcitonin, continue Rocephin and azithromycin    2. Chronic combined  systolic & diastolic CHF - Appears compensated  - Continue beta-blocker, monitor weight and I/Os    3. CKD IV  - Appears close to baseline  - Renally-dose medications, continue bicarbonate, monitor    4. CAD  - Continue ASA, Lipitor, Isordil, and metoprolol    5. Lung cancer  - S/p right upper lobectomy and chemo in 2016  - New lung nodules noted during recent admission and follow-up with oncology was advised should family choose to purse that    6. COPD  - Not in exacerbation  - DuoNeb as needed   7. Dementia  - Continue Namenda, Aricept, and Seroquel, use delirium precautions    DVT prophylaxis: sq heparin  Code Status: DNR/DNI Level of Care: Level of care: Med-Surg Family Communication: None present   Disposition Plan:  Patient is from: SNF  Anticipated d/c is to: SNF  Anticipated d/c date is: 07/31/23  Patient currently: Pending treatment of pneumonia  Consults called: None  Admission status: Inpatient     Briscoe Deutscher, MD [facility]  07/28/2023, 8:48 PM

## 2023-07-28 NOTE — ED Notes (Signed)
Asked patient if she needed to use bathroom or would like assistance to use bedpan.  Patient continued to look at RN and would not respond.  Attempted to ask patient if she was wet or needed any assistance and patient stated "I knew what you said when you first asked it."  Patient refusing to respond or let RN check to see if she is currently soiled.

## 2023-07-28 NOTE — ED Triage Notes (Signed)
Patient arrives by EMS from [facility] for unwitnessed fall earlier today.   Patient currently has no complaints, pt with history of dementia and does not remember falling.   A&Ox2 at baseline.   No blood thinners.   C-collar in place.  EMS VITALS

## 2023-07-28 NOTE — ED Notes (Signed)
Attempted to call floor to give report.  No answer.  SBAR has been charted.

## 2023-07-28 NOTE — ED Notes (Signed)
ED TO INPATIENT HANDOFF REPORT  ED Nurse Name and Phone #: Lynett Fish [phone]  S Name/Age/Gender Betty [name] 77 y.o. female Room/Bed: 001C/001C  Code Status   Code Status: Limited: Do not attempt resuscitation (DNR) -DNR-LIMITED -Do Not Intubate/DNI   Home/SNF/Other Skilled nursing facility Patient oriented to: self Is this baseline? Yes   Triage Complete: Triage complete  Chief Complaint Pneumonia [J18.9]  Triage Note Patient arrives by EMS from [facility] for unwitnessed fall earlier today.   Patient currently has no complaints, pt with history of dementia and does not remember falling.   A&Ox2 at baseline.   No blood thinners.   C-collar in place.  EMS VITALS    Allergies Allergies  Allergen Reactions   Sulfa Antibiotics Itching    Level of Care/Admitting Diagnosis ED Disposition     ED Disposition  Admit   Condition  --   Comment  Hospital Area: [facility] [[id number]]  Level of Care: Med-Surg [16]  May admit patient to [facility] or [facility] if equivalent level of care is available:: No  Covid Evaluation: Asymptomatic - no recent exposure (last 10 days) testing not required  Diagnosis: Pneumonia [227785]  Admitting Physician: Briscoe Deutscher [[id number]]  Attending Physician: Briscoe Deutscher [[id number]]  Certification:: I certify this patient will need inpatient services for at least 2 midnights  Expected Medical Readiness: 07/30/2023          B Medical/Surgery History 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)    Arthritis    bursitis in right shoulder (05/21/2018)   Blood transfusion without reported diagnosis    Cerebral aneurysm, nonruptured 07/16/2018   Right anterior communicating artery   CKD (chronic kidney disease), stage IV (HCC)    Colon polyps    COPD (chronic obstructive pulmonary disease) (HCC)    emphysema   Dementia (HCC) 07/16/2018   GERD  (gastroesophageal reflux disease)    Hypercholesterolemia    Hypertension    Non-small cell carcinoma of right lung, stage 1 (HCC) 05/17/2015   Stage IB, right upper lobectomy 04/29/15, chemo    NSTEMI (non-ST elevated myocardial infarction) (HCC) 03/24/2018   Presence of permanent cardiac pacemaker 05/21/2018   Pure hypercholesterolemia    Sciatica of right side    Seasonal allergic rhinitis    Tobacco dependence    Tubular adenoma of colon    Vitamin D deficiency    Past Surgical History:  Procedure Laterality Date   BIOPSY  06/26/2019   Procedure: BIOPSY;  Surgeon: Charlott Rakes, MD;  Location: [facility] ENDOSCOPY;  Service: Endoscopy;;   BIV PACEMAKER INSERTION CRT-P N/A 05/21/2018   Procedure: BIV PACEMAKER INSERTION CRT-P;  Surgeon: Duke Salvia, MD;  Location: [facility] INVASIVE CV LAB;  Service: Cardiovascular;  Laterality: N/A;   BREAST SURGERY     small mass removed from left breast--benign   CRYO INTERCOSTAL NERVE BLOCK Right 04/29/2015   Procedure: CRYO INTERCOSTAL NERVE BLOCK;  Surgeon: Loreli Slot, MD;  Location: [facility] OR;  Service: Thoracic;  Laterality: Right;   ESOPHAGOGASTRODUODENOSCOPY (EGD) WITH PROPOFOL N/A 06/26/2019   Procedure: ESOPHAGOGASTRODUODENOSCOPY (EGD) WITH PROPOFOL;  Surgeon: Charlott Rakes, MD;  Location: [facility] ENDOSCOPY;  Service: Endoscopy;  Laterality: N/A;   INSERT / REPLACE / REMOVE PACEMAKER  05/21/2018   LOBECTOMY Right 04/29/2015   Procedure: RIGHT LOWER LUNG LOBECTOMY ;  Surgeon: Loreli Slot, MD;  Location: [facility] OR;  Service: Thoracic;  Laterality:  Right;   LYMPH NODE DISSECTION Right 04/29/2015   Procedure: RIGHT LUNG LYMPH NODE DISSECTION;  Surgeon: Loreli Slot, MD;  Location: [facility] OR;  Service: Thoracic;  Laterality: Right;   RIGHT/LEFT HEART CATH AND CORONARY ANGIOGRAPHY N/A 04/24/2018   Procedure: RIGHT/LEFT HEART CATH AND CORONARY ANGIOGRAPHY;  Surgeon: Runell Gess, MD;  Location: [facility] INVASIVE CV LAB;  Service: Cardiovascular;   Laterality: N/A;   VAGINAL DELIVERY     51 yrs ago   VIDEO ASSISTED THORACOSCOPY (VATS)/ LOBECTOMY Right 04/29/2015   Procedure: RIGHT VIDEO ASSISTED THORACOSCOPY (VATS) WEDGE RESECTION/ RIGHT LOWER LOBECTOMY, CRYO-ANALGESIA OF INTERCOSTAL NERVES;  Surgeon: Loreli Slot, MD;  Location: [facility] OR;  Service: Thoracic;  Laterality: Right;     A IV Location/Drains/Wounds Patient Lines/Drains/Airways Status     Active Line/Drains/Airways     Name Placement date Placement time Site Days   Peripheral IV 07/28/23 22 G Posterior;Right Wrist 07/28/23  1943  Wrist  less than 1            Intake/Output Last 24 hours  Intake/Output Summary (Last 24 hours) at 07/28/2023 2130 Last data filed at 07/28/2023 2044 Gross per 24 hour  Intake 500 ml  Output --  Net 500 ml    Labs/Imaging Results for orders placed or performed during the hospital encounter of 07/28/23 (from the past 48 hour(s))  CBC with Differential     Status: Abnormal   Collection Time: 07/28/23  5:43 PM  Result Value Ref Range   WBC 11.2 (H) 4.0 - 10.5 K/uL   RBC 3.25 (L) 3.87 - 5.11 MIL/uL   Hemoglobin 9.4 (L) 12.0 - 15.0 g/dL   HCT 95.6 (L) 21.3 - 08.6 %   MCV 90.8 80.0 - 100.0 fL   MCH 28.9 26.0 - 34.0 pg   MCHC 31.9 30.0 - 36.0 g/dL   RDW 57.8 46.9 - 62.9 %   Platelets 405 (H) 150 - 400 K/uL   nRBC 0.0 0.0 - 0.2 %   Neutrophils Relative % 71 %   Neutro Abs 8.0 (H) 1.7 - 7.7 K/uL   Lymphocytes Relative 20 %   Lymphs Abs 2.3 0.7 - 4.0 K/uL   Monocytes Relative 7 %   Monocytes Absolute 0.7 0.1 - 1.0 K/uL   Eosinophils Relative 1 %   Eosinophils Absolute 0.1 0.0 - 0.5 K/uL   Basophils Relative 0 %   Basophils Absolute 0.1 0.0 - 0.1 K/uL   Immature Granulocytes 1 %   Abs Immature Granulocytes 0.08 (H) 0.00 - 0.07 K/uL    Comment: Performed at [facility], [address], [place], [place] [address]  Comprehensive metabolic panel     Status: Abnormal   Collection Time: 07/28/23  5:43 PM  Result Value  Ref Range   Sodium 143 135 - 145 mmol/L   Potassium 4.2 3.5 - 5.1 mmol/L   Chloride 114 (H) 98 - 111 mmol/L   CO2 17 (L) 22 - 32 mmol/L   Glucose, Bld 86 70 - 99 mg/dL    Comment: Glucose reference range applies only to samples taken after fasting for at least 8 hours.   BUN 56 (H) 8 - 23 mg/dL   Creatinine, Ser 3.24 (H) 0.44 - 1.00 mg/dL   Calcium 8.4 (L) 8.9 - 10.3 mg/dL   Total Protein 6.4 (L) 6.5 - 8.1 g/dL   Albumin 2.4 (L) 3.5 - 5.0 g/dL   AST 37 15 - 41 U/L   ALT 26 0 -  44 U/L   Alkaline Phosphatase 71 38 - 126 U/L   Total Bilirubin 0.3 0.3 - 1.2 mg/dL   GFR, Estimated 15 (L) >60 mL/min    Comment: (NOTE) Calculated using the CKD-EPI Creatinine Equation (2021)    Anion gap 12 5 - 15    Comment: Performed at [facility], [address] [place], [place] [address]  I-Stat Lactic Acid     Status: Abnormal   Collection Time: 07/28/23  7:40 PM  Result Value Ref Range   Lactic Acid, Venous 2.3 (HH) 0.5 - 1.9 mmol/L   Comment NOTIFIED PHYSICIAN   I-Stat venous blood gas, ED     Status: Abnormal   Collection Time: 07/28/23  7:40 PM  Result Value Ref Range   pH, Ven 7.397 7.25 - 7.43   pCO2, Ven 36.4 (L) 44 - 60 mmHg   pO2, Ven 38 32 - 45 mmHg   Bicarbonate 22.4 20.0 - 28.0 mmol/L   TCO2 23 22 - 32 mmol/L   O2 Saturation 72 %   Acid-base deficit 2.0 0.0 - 2.0 mmol/L   Sodium 147 (H) 135 - 145 mmol/L   Potassium 4.6 3.5 - 5.1 mmol/L   Calcium, Ion 1.13 (L) 1.15 - 1.40 mmol/L   HCT 31.0 (L) 36.0 - 46.0 %   Hemoglobin 10.5 (L) 12.0 - 15.0 g/dL   Sample type VENOUS    Comment NOTIFIED PHYSICIAN    CT Head Wo Contrast  Result Date: 07/28/2023 CLINICAL DATA:  Head trauma, minor (Age >= 65y); Neck trauma (Age >= 65y) EXAM: CT HEAD WITHOUT CONTRAST CT CERVICAL SPINE WITHOUT CONTRAST TECHNIQUE: Multidetector CT imaging of the head and cervical spine was performed following the standard protocol without intravenous contrast. Multiplanar CT image reconstructions of the  cervical spine were also generated. RADIATION DOSE REDUCTION: This exam was performed according to the departmental dose-optimization program which includes automated exposure control, adjustment of the mA and/or kV according to patient size and/or use of iterative reconstruction technique. COMPARISON:  CT head 01/28/2021, CT head 06/25/2023 FINDINGS: CT HEAD FINDINGS Brain: Cerebral ventricle sizes are concordant with the degree of cerebral volume loss. Patchy and confluent areas of decreased attenuation are noted throughout the deep and periventricular white matter of the cerebral hemispheres bilaterally, compatible with chronic microvascular ischemic disease. No evidence of large-territorial acute infarction. No parenchymal hemorrhage. No mass lesion. No extra-axial collection. No mass effect or midline shift. No hydrocephalus. Basilar cisterns are patent. Vascular: No hyperdense vessel. Skull: No acute fracture or focal lesion. Sinuses/Orbits: Paranasal sinuses and mastoid air cells are clear. The orbits are unremarkable. Other: None. CT CERVICAL SPINE FINDINGS Alignment: Grade 1 anterolisthesis of C2 on C3. Grade 1 anterolisthesis of C4 on C5. Reversal of the normal cervical lordosis centered at the C4-C5 level likely due to positioning and degenerative changes. Skull base and vertebrae: Multilevel moderate degenerative changes spine. Posterior disc osteophyte complex formation at the C3-C4 and C4-C5 levels. No associated severe osseous neural foraminal or central canal stenosis. No acute fracture. No aggressive appearing focal osseous lesion or focal pathologic process. Soft tissues and spinal canal: No prevertebral fluid or swelling. No visible canal hematoma. Upper chest: Biapical pleural/pulmonary scarring. Other: Atherosclerotic plaque of the carotid arteries within the neck. IMPRESSION: 1. No acute intracranial abnormality. 2. No acute displaced fracture or traumatic listhesis of the cervical spine.  Electronically Signed   By: Tish Frederickson M.D.   On: 07/28/2023 19:17   CT Cervical Spine Wo Contrast  Result Date: 07/28/2023 CLINICAL DATA:  Head trauma, minor (Age >= 65y); Neck trauma (Age >= 65y) EXAM: CT HEAD WITHOUT CONTRAST CT CERVICAL SPINE WITHOUT CONTRAST TECHNIQUE: Multidetector CT imaging of the head and cervical spine was performed following the standard protocol without intravenous contrast. Multiplanar CT image reconstructions of the cervical spine were also generated. RADIATION DOSE REDUCTION: This exam was performed according to the departmental dose-optimization program which includes automated exposure control, adjustment of the mA and/or kV according to patient size and/or use of iterative reconstruction technique. COMPARISON:  CT head 01/28/2021, CT head 06/25/2023 FINDINGS: CT HEAD FINDINGS Brain: Cerebral ventricle sizes are concordant with the degree of cerebral volume loss. Patchy and confluent areas of decreased attenuation are noted throughout the deep and periventricular white matter of the cerebral hemispheres bilaterally, compatible with chronic microvascular ischemic disease. No evidence of large-territorial acute infarction. No parenchymal hemorrhage. No mass lesion. No extra-axial collection. No mass effect or midline shift. No hydrocephalus. Basilar cisterns are patent. Vascular: No hyperdense vessel. Skull: No acute fracture or focal lesion. Sinuses/Orbits: Paranasal sinuses and mastoid air cells are clear. The orbits are unremarkable. Other: None. CT CERVICAL SPINE FINDINGS Alignment: Grade 1 anterolisthesis of C2 on C3. Grade 1 anterolisthesis of C4 on C5. Reversal of the normal cervical lordosis centered at the C4-C5 level likely due to positioning and degenerative changes. Skull base and vertebrae: Multilevel moderate degenerative changes spine. Posterior disc osteophyte complex formation at the C3-C4 and C4-C5 levels. No associated severe osseous neural foraminal or  central canal stenosis. No acute fracture. No aggressive appearing focal osseous lesion or focal pathologic process. Soft tissues and spinal canal: No prevertebral fluid or swelling. No visible canal hematoma. Upper chest: Biapical pleural/pulmonary scarring. Other: Atherosclerotic plaque of the carotid arteries within the neck. IMPRESSION: 1. No acute intracranial abnormality. 2. No acute displaced fracture or traumatic listhesis of the cervical spine. Electronically Signed   By: Tish Frederickson M.D.   On: 07/28/2023 19:17   DG Chest Port 1 View  Result Date: 07/28/2023 CLINICAL DATA:  Tachypnea EXAM: PORTABLE CHEST 1 VIEW COMPARISON:  06/24/2023, 06/03/2023, 03/30/2021 FINDINGS: Left-sided multi lead pacing device as before. Cardiomegaly without acute airspace disease, pleural effusion or pneumothorax. Interstitial opacities in the left mid to lower lung. Aortic atherosclerosis. IMPRESSION: Cardiomegaly. Mild reticular interstitial opacities in the left mid to lower lung, question atypical infection versus chronic interstitial change. Electronically Signed   By: Jasmine Pang M.D.   On: 07/28/2023 18:24    Pending Labs Unresulted Labs (From admission, onward)     Start     Ordered   07/29/23 0500  Basic metabolic panel  Daily,   R      07/28/23 2048   07/29/23 0500  CBC  Daily,   R      07/28/23 2048   07/28/23 2049  Procalcitonin  Daily,   R     References:    Procalcitonin Lower Respiratory Tract Infection AND Sepsis Procalcitonin Algorithm   07/28/23 2048   07/28/23 2047  Legionella Pneumophila Serogp 1 Ur Ag  (COPD / Pneumonia / Cellulitis / Lower Extremity Wound)  Once,   R        07/28/23 2048   07/28/23 2047  Strep pneumoniae urinary antigen  (COPD / Pneumonia / Cellulitis / Lower Extremity Wound)  Once,   R        07/28/23 2048   07/28/23 1625  Urinalysis, Routine w reflex microscopic -Urine, Clean Catch  Once,  URGENT       Question:  Specimen Source  Answer:  Urine, Clean Catch    07/28/23 1625            Vitals/Pain Today's Vitals   07/28/23 2030 07/28/23 2045 07/28/23 2100 07/28/23 2115  BP: 132/68 124/69 124/70 111/79  Pulse:      Resp: 19 19 16  (!) 22  Temp:      TempSrc:      SpO2:        Isolation Precautions No active isolations  Medications Medications  cefTRIAXone (ROCEPHIN) 1 g in sodium chloride 0.9 % 100 mL IVPB (1 g Intravenous New Bag/Given 07/28/23 2129)  azithromycin (ZITHROMAX) 500 mg in sodium chloride 0.9 % 250 mL IVPB (has no administration in time range)  aspirin EC tablet 81 mg (has no administration in time range)  atorvastatin (LIPITOR) tablet 40 mg (has no administration in time range)  metoprolol succinate (TOPROL-XL) 24 hr tablet 50 mg (has no administration in time range)  isosorbide dinitrate (ISORDIL) tablet 20 mg (has no administration in time range)  donepezil (ARICEPT) tablet 10 mg (has no administration in time range)  memantine (NAMENDA) tablet 10 mg (has no administration in time range)  QUEtiapine (SEROQUEL) tablet 25 mg (has no administration in time range)  pantoprazole (PROTONIX) EC tablet 40 mg (has no administration in time range)  sodium bicarbonate tablet 650 mg (has no administration in time range)  cefTRIAXone (ROCEPHIN) 2 g in sodium chloride 0.9 % 100 mL IVPB (has no administration in time range)  azithromycin (ZITHROMAX) tablet 500 mg (has no administration in time range)  heparin injection 5,000 Units (has no administration in time range)  acetaminophen (TYLENOL) tablet 650 mg (has no administration in time range)    Or  acetaminophen (TYLENOL) suppository 650 mg (has no administration in time range)  senna-docusate (Senokot-S) tablet 1 tablet (has no administration in time range)  ipratropium-albuterol (DUONEB) 0.5-2.5 (3) MG/3ML nebulizer solution 3 mL (has no administration in time range)  guaiFENesin (ROBITUSSIN) 100 MG/5ML liquid 5 mL (has no administration in time range)  lactated ringers bolus  500 mL (0 mLs Intravenous Stopped 07/28/23 2044)    Mobility walks with device     Focused Assessments Patient alert to self.  Baseline mental status for patient.     R Recommendations: See Admitting Provider Note  Report given to:   Additional Notes: Have attempted to obtain urine sample without success.

## 2023-07-28 NOTE — ED Notes (Signed)
Patient transported to floor at this time.

## 2023-07-28 NOTE — ED Provider Notes (Signed)
[facility] EMERGENCY DEPARTMENT AT [facility] Provider Note   CSN: [id number] Arrival date & time: 07/28/23  1610     History  Chief Complaint  Patient presents with   Marletta Lor    RIMA DESCHNER is a 77 y.o. female.  HPI 77 year old female history of heart failure, CKD, COPD, GERD, hypertension, hyperlipidemia presenting for fall.  Patient has history of dementia and reportedly had unwitnessed fall at her facility.  She is reportedly at her neurologic baseline.  She complained of neck pain earlier so presents in a collar.  She is hemodynamically stable with EMS.  Here patient does not remember falling.  She feels well, denies headache or neck pain.  Denies any chest pain, shortness of breath or abdominal pain.  She has no concerns but feels like she needs to pee.  Denies dysuria or hematuria.     Home Medications Prior to Admission medications   Medication Sig Start Date End Date Taking? Authorizing Provider  cetirizine (ZYRTEC) 10 MG tablet Take 10 mg by mouth daily.   Yes [provider]  pantoprazole (PROTONIX) 40 MG tablet Take 40 mg by mouth every morning. 03/14/23  Yes [provider]  acetaminophen (TYLENOL) 325 MG tablet Take 650 mg by mouth every 6 (six) hours as needed for mild pain or moderate pain.    [provider]  aspirin EC 81 MG tablet Take 81 mg by mouth at bedtime.    [provider]  atorvastatin (LIPITOR) 40 MG tablet TAKE 1 TABLET BY MOUTH EVERY DAY AT 6 PM 07/12/23   Duke Salvia, MD  Cholecalciferol (VITAMIN D) 2000 units tablet Take 1,000 Units by mouth at bedtime.     [provider]  donepezil (ARICEPT) 10 MG tablet Take 1 tablet (10 mg total) by mouth at bedtime. 12/14/21   Glean Salvo, NP  febuxostat (ULORIC) 40 MG tablet Take 40 mg by mouth at bedtime. 03/27/23   [provider]  isosorbide dinitrate (ISORDIL) 20 MG tablet TAKE 1 TABLET BY MOUTH THREE TIMES DAILY 08/08/22   Lyn Records, MD   memantine (NAMENDA) 10 MG tablet Take 1 tablet (10 mg total) by mouth 2 (two) times daily. 06/28/22   Glean Salvo, NP  metoprolol succinate (TOPROL-XL) 50 MG 24 hr tablet Take 1 tablet (50 mg total) by mouth daily. Take with or immediately following a meal. 06/10/23   Meriam Sprague, MD  Multiple Vitamin (MULTIVITAMIN WITH MINERALS) TABS tablet Take 0.5 tablets by mouth at bedtime.    [provider]  nitroGLYCERIN (NITROSTAT) 0.4 MG SL tablet Place 1 tablet (0.4 mg total) under the tongue every 5 (five) minutes x 3 doses as needed for chest pain. Patient not taking: Reported on 06/25/2023 04/26/18   Berton Bon, NP  QUEtiapine (SEROQUEL) 25 MG tablet Take 25 mg by mouth at bedtime. 05/28/23   [provider]  sodium bicarbonate 650 MG tablet Take 1 tablet (650 mg total) by mouth 2 (two) times daily. 07/02/19   Marguerita Merles Latif, DO      Allergies    Sulfa antibiotics    Review of Systems   Review of Systems Review of systems completed and notable as per HPI.  ROS otherwise negative.   Physical Exam Updated Vital Signs BP 111/79 (BP Location: Right Arm)   Pulse 75   Temp 98.2 F (36.8 C) (Oral)   Resp (!) 22   SpO2 97%  Physical Exam Vitals and nursing  note reviewed.  Constitutional:      General: She is not in acute distress.    Appearance: She is well-developed.  HENT:     Head: Normocephalic and atraumatic.     Mouth/Throat:     Mouth: Mucous membranes are moist.     Pharynx: Oropharynx is clear.  Eyes:     Extraocular Movements: Extraocular movements intact.     Conjunctiva/sclera: Conjunctivae normal.     Pupils: Pupils are equal, round, and reactive to light.  Neck:     Comments: In cervical collar Cardiovascular:     Rate and Rhythm: Normal rate and regular rhythm.     Heart sounds: No murmur heard. Pulmonary:     Effort: No respiratory distress.     Breath sounds: Normal breath sounds.     Comments: Clear lungs, mild  tachypnea. Abdominal:     Palpations: Abdomen is soft.     Tenderness: There is no abdominal tenderness.  Musculoskeletal:        General: No swelling.     Cervical back: No tenderness.     Right lower leg: No edema.     Left lower leg: No edema.     Comments: No spinal tenderness  Skin:    General: Skin is warm and dry.     Capillary Refill: Capillary refill takes less than 2 seconds.  Neurological:     Mental Status: She is alert.  Psychiatric:        Mood and Affect: Mood normal.     ED Results / Procedures / Treatments   Labs (all labs ordered are listed, but only abnormal results are displayed) Labs Reviewed  CBC WITH DIFFERENTIAL/PLATELET - Abnormal; Notable for the following components:      Result Value   WBC 11.2 (*)    RBC 3.25 (*)    Hemoglobin 9.4 (*)    HCT 29.5 (*)    Platelets 405 (*)    Neutro Abs 8.0 (*)    Abs Immature Granulocytes 0.08 (*)    All other components within normal limits  COMPREHENSIVE METABOLIC PANEL - Abnormal; Notable for the following components:   Chloride 114 (*)    CO2 17 (*)    BUN 56 (*)    Creatinine, Ser 3.07 (*)    Calcium 8.4 (*)    Total Protein 6.4 (*)    Albumin 2.4 (*)    GFR, Estimated 15 (*)    All other components within normal limits  I-STAT CG4 LACTIC ACID, ED - Abnormal; Notable for the following components:   Lactic Acid, Venous 2.3 (*)    All other components within normal limits  I-STAT VENOUS BLOOD GAS, ED - Abnormal; Notable for the following components:   pCO2, Ven 36.4 (*)    Sodium 147 (*)    Calcium, Ion 1.13 (*)    HCT 31.0 (*)    Hemoglobin 10.5 (*)    All other components within normal limits  URINALYSIS, ROUTINE W REFLEX MICROSCOPIC  LEGIONELLA PNEUMOPHILA SEROGP 1 UR AG  STREP PNEUMONIAE URINARY ANTIGEN  BASIC METABOLIC PANEL  CBC  PROCALCITONIN  PROCALCITONIN  I-STAT CG4 LACTIC ACID, ED    EKG None  Radiology CT Head Wo Contrast  Result Date: 07/28/2023 CLINICAL DATA:  Head  trauma, minor (Age >= 65y); Neck trauma (Age >= 65y) EXAM: CT HEAD WITHOUT CONTRAST CT CERVICAL SPINE WITHOUT CONTRAST TECHNIQUE: Multidetector CT imaging of the head and cervical spine was performed following the standard protocol without  intravenous contrast. Multiplanar CT image reconstructions of the cervical spine were also generated. RADIATION DOSE REDUCTION: This exam was performed according to the departmental dose-optimization program which includes automated exposure control, adjustment of the mA and/or kV according to patient size and/or use of iterative reconstruction technique. COMPARISON:  CT head 01/28/2021, CT head 06/25/2023 FINDINGS: CT HEAD FINDINGS Brain: Cerebral ventricle sizes are concordant with the degree of cerebral volume loss. Patchy and confluent areas of decreased attenuation are noted throughout the deep and periventricular white matter of the cerebral hemispheres bilaterally, compatible with chronic microvascular ischemic disease. No evidence of large-territorial acute infarction. No parenchymal hemorrhage. No mass lesion. No extra-axial collection. No mass effect or midline shift. No hydrocephalus. Basilar cisterns are patent. Vascular: No hyperdense vessel. Skull: No acute fracture or focal lesion. Sinuses/Orbits: Paranasal sinuses and mastoid air cells are clear. The orbits are unremarkable. Other: None. CT CERVICAL SPINE FINDINGS Alignment: Grade 1 anterolisthesis of C2 on C3. Grade 1 anterolisthesis of C4 on C5. Reversal of the normal cervical lordosis centered at the C4-C5 level likely due to positioning and degenerative changes. Skull base and vertebrae: Multilevel moderate degenerative changes spine. Posterior disc osteophyte complex formation at the C3-C4 and C4-C5 levels. No associated severe osseous neural foraminal or central canal stenosis. No acute fracture. No aggressive appearing focal osseous lesion or focal pathologic process. Soft tissues and spinal canal: No  prevertebral fluid or swelling. No visible canal hematoma. Upper chest: Biapical pleural/pulmonary scarring. Other: Atherosclerotic plaque of the carotid arteries within the neck. IMPRESSION: 1. No acute intracranial abnormality. 2. No acute displaced fracture or traumatic listhesis of the cervical spine. Electronically Signed   By: Tish Frederickson M.D.   On: 07/28/2023 19:17   CT Cervical Spine Wo Contrast  Result Date: 07/28/2023 CLINICAL DATA:  Head trauma, minor (Age >= 65y); Neck trauma (Age >= 65y) EXAM: CT HEAD WITHOUT CONTRAST CT CERVICAL SPINE WITHOUT CONTRAST TECHNIQUE: Multidetector CT imaging of the head and cervical spine was performed following the standard protocol without intravenous contrast. Multiplanar CT image reconstructions of the cervical spine were also generated. RADIATION DOSE REDUCTION: This exam was performed according to the departmental dose-optimization program which includes automated exposure control, adjustment of the mA and/or kV according to patient size and/or use of iterative reconstruction technique. COMPARISON:  CT head 01/28/2021, CT head 06/25/2023 FINDINGS: CT HEAD FINDINGS Brain: Cerebral ventricle sizes are concordant with the degree of cerebral volume loss. Patchy and confluent areas of decreased attenuation are noted throughout the deep and periventricular white matter of the cerebral hemispheres bilaterally, compatible with chronic microvascular ischemic disease. No evidence of large-territorial acute infarction. No parenchymal hemorrhage. No mass lesion. No extra-axial collection. No mass effect or midline shift. No hydrocephalus. Basilar cisterns are patent. Vascular: No hyperdense vessel. Skull: No acute fracture or focal lesion. Sinuses/Orbits: Paranasal sinuses and mastoid air cells are clear. The orbits are unremarkable. Other: None. CT CERVICAL SPINE FINDINGS Alignment: Grade 1 anterolisthesis of C2 on C3. Grade 1 anterolisthesis of C4 on C5. Reversal of the  normal cervical lordosis centered at the C4-C5 level likely due to positioning and degenerative changes. Skull base and vertebrae: Multilevel moderate degenerative changes spine. Posterior disc osteophyte complex formation at the C3-C4 and C4-C5 levels. No associated severe osseous neural foraminal or central canal stenosis. No acute fracture. No aggressive appearing focal osseous lesion or focal pathologic process. Soft tissues and spinal canal: No prevertebral fluid or swelling. No visible canal hematoma. Upper chest: Biapical pleural/pulmonary scarring. Other: Atherosclerotic  plaque of the carotid arteries within the neck. IMPRESSION: 1. No acute intracranial abnormality. 2. No acute displaced fracture or traumatic listhesis of the cervical spine. Electronically Signed   By: Tish Frederickson M.D.   On: 07/28/2023 19:17   DG Chest Port 1 View  Result Date: 07/28/2023 CLINICAL DATA:  Tachypnea EXAM: PORTABLE CHEST 1 VIEW COMPARISON:  06/24/2023, 06/03/2023, 03/30/2021 FINDINGS: Left-sided multi lead pacing device as before. Cardiomegaly without acute airspace disease, pleural effusion or pneumothorax. Interstitial opacities in the left mid to lower lung. Aortic atherosclerosis. IMPRESSION: Cardiomegaly. Mild reticular interstitial opacities in the left mid to lower lung, question atypical infection versus chronic interstitial change. Electronically Signed   By: Jasmine Pang M.D.   On: 07/28/2023 18:24    Procedures Procedures    Medications Ordered in ED Medications  azithromycin (ZITHROMAX) 500 mg in sodium chloride 0.9 % 250 mL IVPB (has no administration in time range)  aspirin EC tablet 81 mg (has no administration in time range)  atorvastatin (LIPITOR) tablet 40 mg (has no administration in time range)  metoprolol succinate (TOPROL-XL) 24 hr tablet 50 mg (has no administration in time range)  isosorbide dinitrate (ISORDIL) tablet 20 mg (has no administration in time range)  donepezil (ARICEPT)  tablet 10 mg (has no administration in time range)  memantine (NAMENDA) tablet 10 mg (has no administration in time range)  QUEtiapine (SEROQUEL) tablet 25 mg (has no administration in time range)  pantoprazole (PROTONIX) EC tablet 40 mg (has no administration in time range)  sodium bicarbonate tablet 650 mg (has no administration in time range)  cefTRIAXone (ROCEPHIN) 2 g in sodium chloride 0.9 % 100 mL IVPB (has no administration in time range)  azithromycin (ZITHROMAX) tablet 500 mg (has no administration in time range)  heparin injection 5,000 Units (has no administration in time range)  acetaminophen (TYLENOL) tablet 650 mg (has no administration in time range)    Or  acetaminophen (TYLENOL) suppository 650 mg (has no administration in time range)  senna-docusate (Senokot-S) tablet 1 tablet (has no administration in time range)  ipratropium-albuterol (DUONEB) 0.5-2.5 (3) MG/3ML nebulizer solution 3 mL (has no administration in time range)  guaiFENesin (ROBITUSSIN) 100 MG/5ML liquid 5 mL (has no administration in time range)  lactated ringers bolus 500 mL (0 mLs Intravenous Stopped 07/28/23 2044)  cefTRIAXone (ROCEPHIN) 1 g in sodium chloride 0.9 % 100 mL IVPB (1 g Intravenous New Bag/Given 07/28/23 2129)    ED Course/ Medical Decision Making/ A&P                                 Medical Decision Making Amount and/or Complexity of Data Reviewed Labs: ordered. Radiology: ordered.  Risk Decision regarding hospitalization.   Medical Decision Making:   MARCIEL BRASHEARS is a 77 y.o. female who presented to the ED today with unwitnessed fall.  Patient has history of dementia so history is somewhat limited.  She feels well and denies any symptoms.  She is well-appearing, no external signs of trauma.  Will obtain CT head, cervical spine.  Also obtain chest x-ray given she has some slight tachypnea on exam although denies shortness of breath.  Otherwise has clear lungs and normal work of  breathing.  Obtain chest x-ray.   Patient placed on continuous vitals and telemetry monitoring while in ED which was reviewed periodically.  Reviewed and confirmed nursing documentation for past medical history, family history, social history.  Reassessment and Plan:   Lab work is notable for elevated lactic acid as well as non-anion gap metabolic acidosis.  Son reports she has not been eating well, could be component of dehydration.  Renal function appears to be at baseline.  Her chest x-ray is concerning for early pneumonia as well as cardiomegaly.  I did a bedside ultrasound showed some diminished IVC, no pericardial effusion.  CT head and cervical spine are unremarkable.  Given leukocytosis, lactic acidosis with chest x-ray findings concerning for pneumonia discussed with Dr. Antionette Char and admitted for further care.   Patient's presentation is most consistent with acute complicated illness / injury requiring diagnostic workup.           Final Clinical Impression(s) / ED Diagnoses Final diagnoses:  Community acquired pneumonia of left lower lobe of lung    Rx / DC Orders ED Discharge Orders     None         Laurence Spates, MD 07/28/23 2201

## 2023-07-29 DIAGNOSIS — Y92009 Unspecified place in unspecified non-institutional (private) residence as the place of occurrence of the external cause: Secondary | ICD-10-CM

## 2023-07-29 DIAGNOSIS — W19XXXA Unspecified fall, initial encounter: Secondary | ICD-10-CM | POA: Diagnosis not present

## 2023-07-29 DIAGNOSIS — J189 Pneumonia, unspecified organism: Secondary | ICD-10-CM | POA: Diagnosis not present

## 2023-07-29 LAB — CBC
HCT: 32.8 % — ABNORMAL LOW (ref 36.0–46.0)
Hemoglobin: 10.3 g/dL — ABNORMAL LOW (ref 12.0–15.0)
MCH: 28.9 pg (ref 26.0–34.0)
MCHC: 31.4 g/dL (ref 30.0–36.0)
MCV: 91.9 fL (ref 80.0–100.0)
Platelets: 429 10*3/uL — ABNORMAL HIGH (ref 150–400)
RBC: 3.57 MIL/uL — ABNORMAL LOW (ref 3.87–5.11)
RDW: 13.5 % (ref 11.5–15.5)
WBC: 8.1 10*3/uL (ref 4.0–10.5)
nRBC: 0 % (ref 0.0–0.2)

## 2023-07-29 LAB — BASIC METABOLIC PANEL
Anion gap: 11 (ref 5–15)
BUN: 47 mg/dL — ABNORMAL HIGH (ref 8–23)
CO2: 22 mmol/L (ref 22–32)
Calcium: 9 mg/dL (ref 8.9–10.3)
Chloride: 114 mmol/L — ABNORMAL HIGH (ref 98–111)
Creatinine, Ser: 3.15 mg/dL — ABNORMAL HIGH (ref 0.44–1.00)
GFR, Estimated: 15 mL/min — ABNORMAL LOW (ref >60)
Glucose, Bld: 83 mg/dL (ref 70–99)
Potassium: 4.2 mmol/L (ref 3.5–5.1)
Sodium: 147 mmol/L — ABNORMAL HIGH (ref 135–145)

## 2023-07-29 LAB — PROCALCITONIN: Procalcitonin: 0.16 ng/mL

## 2023-07-29 LAB — LACTIC ACID, PLASMA: Lactic Acid, Venous: 1.2 mmol/L (ref 0.5–1.9)

## 2023-07-29 MED ORDER — AZITHROMYCIN 500 MG PO TABS: 500.0000 mg | ORAL_TABLET | Freq: Every day | ORAL | Status: AC

## 2023-07-29 NOTE — Care Management Obs Status (Cosign Needed)
MEDICARE OBSERVATION STATUS NOTIFICATION   Patient Details  Name: Betty [name] MRN: [id number] Date of Birth: 1946/07/11   Medicare Observation Status Notification Given:  Yes    Janae Bridgeman, RN 07/29/2023, 3:24 PM

## 2023-07-29 NOTE — Plan of Care (Signed)

## 2023-07-29 NOTE — Discharge Summary (Signed)
Physician Discharge Summary  Patient: Betty [name] ZOX:[id number] DOB: September 06, 1946   Code Status: Limited: Do not attempt resuscitation (DNR) -DNR-LIMITED -Do Not Intubate/DNI  Admit date: 07/28/2023 Discharge date: 07/29/2023 Disposition: Skilled nursing facility, PT, OT, nurse aid, and RN PCP: Hyacinth Meeker, [place], [place]  Recommendations for Outpatient Follow-up:  Follow up with PCP within 1-2 weeks Regarding general hospital follow up and preventative care  Discharge Diagnoses:  Principal Problem:   Pneumonia Active Problems:   Non-small cell carcinoma of right lung, stage 1 (HCC)   CKD (chronic kidney disease), stage IV (HCC)   Nonischemic cardiomyopathy (HCC)   Dementia (HCC)   Coronary artery disease  Brief Hospital Course Summary: Betty [name] is a 77 y.o. female with medical history significant for COPD, dementia, CKD stage IV, chronic combined systolic and diastolic CHF, lung cancer, and recent admission for perforated appendicitis that was managed conservatively who now presents from her SNF after an unwitnessed fall.   Patient does not remember the fall and has no acute complaints.  She is oriented to person, knows that she is in a hospital, but unable to provide much history at all.  She is at her neurologic baseline per report of SNF personnel.   ED Course: Upon arrival to the ED, patient is found to be afebrile and saturating well on room air with tachypnea, normal heart rate, and stable blood pressure.  Labs are most notable for WBC 11,600, platelets 405,000, lactic acid 2.3>1.2, serum bicarbonate 17, and creatinine 3.07.   No acute findings are noted on CT of the head or cervical spine.   There is a mild interstitial opacity in the left lung on chest x-ray representing atypical infection vs chronic interstitial change. Patient was given 500 mL of LR, Rocephin, and azithromycin in the ED.  She remained stable ORA without respiratory symptoms but did receive IV Abx to cover  possible infectious process. They were continued as oral on discharge. I would recommend follow up lung imaging if in line with goals of care.   07/29/23 -Stable for discharge home without significant injuries from her fall. She is at her baseline and ready to return to her facility.   Discharge Condition: Good, improved Recommended discharge diet: Regular healthy diet  Consultations: None   Procedures/Studies: None   Discharge Instructions     Discharge patient   Complete by: As directed    Discharge disposition: 03-Skilled Nursing Facility   Discharge patient date: 07/29/2023      Allergies as of 07/29/2023       Reactions   Sulfa Antibiotics Itching        Medication List     TAKE these medications    acetaminophen 325 MG tablet Commonly known as: TYLENOL Take 650 mg by mouth every 6 (six) hours as needed for mild pain or moderate pain.   aspirin EC 81 MG tablet Take 81 mg by mouth at bedtime.   atorvastatin 40 MG tablet Commonly known as: LIPITOR TAKE 1 TABLET BY MOUTH EVERY DAY AT 6 PM   azithromycin 500 MG tablet Commonly known as: ZITHROMAX Take 1 tablet (500 mg total) by mouth daily for 2 days.   cetirizine 10 MG tablet Commonly known as: ZYRTEC Take 10 mg by mouth daily.   donepezil 10 MG tablet Commonly known as: ARICEPT Take 1 tablet (10 mg total) by mouth at bedtime.   febuxostat 40 MG tablet Commonly known as: ULORIC Take 40 mg by mouth at bedtime.  isosorbide dinitrate 20 MG tablet Commonly known as: ISORDIL TAKE 1 TABLET BY MOUTH THREE TIMES DAILY   memantine 10 MG tablet Commonly known as: NAMENDA Take 1 tablet (10 mg total) by mouth 2 (two) times daily.   metoprolol succinate 50 MG 24 hr tablet Commonly known as: TOPROL-XL Take 1 tablet (50 mg total) by mouth daily. Take with or immediately following a meal.   mirtazapine 7.5 MG tablet Commonly known as: REMERON Take 7.5 mg by mouth at bedtime.   multivitamin with minerals  Tabs tablet Take 0.5 tablets by mouth at bedtime.   nitroGLYCERIN 0.4 MG SL tablet Commonly known as: NITROSTAT Place 1 tablet (0.4 mg total) under the tongue every 5 (five) minutes x 3 doses as needed for chest pain.   NUTRITIONAL SUPPLEMENTS PO Take 120 mLs by mouth daily.   pantoprazole 40 MG tablet Commonly known as: PROTONIX Take 40 mg by mouth every morning.   QUEtiapine 25 MG tablet Commonly known as: SEROQUEL Take 25 mg by mouth at bedtime.   sodium bicarbonate 650 MG tablet Take 1 tablet (650 mg total) by mouth 2 (two) times daily.   Vitamin D 50 MCG (2000 UT) tablet Take 1,000 Units by mouth at bedtime.         Subjective   Pt reports no complaints. She Denies respiratory complaints. No CP, SOB, cough. She denies pain or discomfort.   All questions and concerns were addressed at time of discharge.  Objective  Blood pressure 136/67, pulse 79, temperature (!) 97.5 F (36.4 C), temperature source Oral, resp. rate 20, weight 50.3 kg, SpO2 100%.   General: Pt is alert, awake, not in acute distress. frail Cardiovascular: RRR, S1/S2 +, no rubs, no gallops Respiratory: CTA bilaterally, no wheezing, no rhonchi Abdominal: Soft, NT, ND, bowel sounds + Extremities: no edema, no cyanosis  The results of significant diagnostics from this hospitalization (including imaging, microbiology, ancillary and laboratory) are listed below for reference.   Imaging studies: CT Head Wo Contrast  Result Date: 07/28/2023 CLINICAL DATA:  Head trauma, minor (Age >= 65y); Neck trauma (Age >= 65y) EXAM: CT HEAD WITHOUT CONTRAST CT CERVICAL SPINE WITHOUT CONTRAST TECHNIQUE: Multidetector CT imaging of the head and cervical spine was performed following the standard protocol without intravenous contrast. Multiplanar CT image reconstructions of the cervical spine were also generated. RADIATION DOSE REDUCTION: This exam was performed according to the departmental dose-optimization program which  includes automated exposure control, adjustment of the mA and/or kV according to patient size and/or use of iterative reconstruction technique. COMPARISON:  CT head 01/28/2021, CT head 06/25/2023 FINDINGS: CT HEAD FINDINGS Brain: Cerebral ventricle sizes are concordant with the degree of cerebral volume loss. Patchy and confluent areas of decreased attenuation are noted throughout the deep and periventricular white matter of the cerebral hemispheres bilaterally, compatible with chronic microvascular ischemic disease. No evidence of large-territorial acute infarction. No parenchymal hemorrhage. No mass lesion. No extra-axial collection. No mass effect or midline shift. No hydrocephalus. Basilar cisterns are patent. Vascular: No hyperdense vessel. Skull: No acute fracture or focal lesion. Sinuses/Orbits: Paranasal sinuses and mastoid air cells are clear. The orbits are unremarkable. Other: None. CT CERVICAL SPINE FINDINGS Alignment: Grade 1 anterolisthesis of C2 on C3. Grade 1 anterolisthesis of C4 on C5. Reversal of the normal cervical lordosis centered at the C4-C5 level likely due to positioning and degenerative changes. Skull base and vertebrae: Multilevel moderate degenerative changes spine. Posterior disc osteophyte complex formation at the C3-C4 and C4-C5 levels. No  associated severe osseous neural foraminal or central canal stenosis. No acute fracture. No aggressive appearing focal osseous lesion or focal pathologic process. Soft tissues and spinal canal: No prevertebral fluid or swelling. No visible canal hematoma. Upper chest: Biapical pleural/pulmonary scarring. Other: Atherosclerotic plaque of the carotid arteries within the neck. IMPRESSION: 1. No acute intracranial abnormality. 2. No acute displaced fracture or traumatic listhesis of the cervical spine. Electronically Signed   By: Tish Frederickson M.D.   On: 07/28/2023 19:17   CT Cervical Spine Wo Contrast  Result Date: 07/28/2023 CLINICAL DATA:  Head  trauma, minor (Age >= 65y); Neck trauma (Age >= 65y) EXAM: CT HEAD WITHOUT CONTRAST CT CERVICAL SPINE WITHOUT CONTRAST TECHNIQUE: Multidetector CT imaging of the head and cervical spine was performed following the standard protocol without intravenous contrast. Multiplanar CT image reconstructions of the cervical spine were also generated. RADIATION DOSE REDUCTION: This exam was performed according to the departmental dose-optimization program which includes automated exposure control, adjustment of the mA and/or kV according to patient size and/or use of iterative reconstruction technique. COMPARISON:  CT head 01/28/2021, CT head 06/25/2023 FINDINGS: CT HEAD FINDINGS Brain: Cerebral ventricle sizes are concordant with the degree of cerebral volume loss. Patchy and confluent areas of decreased attenuation are noted throughout the deep and periventricular white matter of the cerebral hemispheres bilaterally, compatible with chronic microvascular ischemic disease. No evidence of large-territorial acute infarction. No parenchymal hemorrhage. No mass lesion. No extra-axial collection. No mass effect or midline shift. No hydrocephalus. Basilar cisterns are patent. Vascular: No hyperdense vessel. Skull: No acute fracture or focal lesion. Sinuses/Orbits: Paranasal sinuses and mastoid air cells are clear. The orbits are unremarkable. Other: None. CT CERVICAL SPINE FINDINGS Alignment: Grade 1 anterolisthesis of C2 on C3. Grade 1 anterolisthesis of C4 on C5. Reversal of the normal cervical lordosis centered at the C4-C5 level likely due to positioning and degenerative changes. Skull base and vertebrae: Multilevel moderate degenerative changes spine. Posterior disc osteophyte complex formation at the C3-C4 and C4-C5 levels. No associated severe osseous neural foraminal or central canal stenosis. No acute fracture. No aggressive appearing focal osseous lesion or focal pathologic process. Soft tissues and spinal canal: No  prevertebral fluid or swelling. No visible canal hematoma. Upper chest: Biapical pleural/pulmonary scarring. Other: Atherosclerotic plaque of the carotid arteries within the neck. IMPRESSION: 1. No acute intracranial abnormality. 2. No acute displaced fracture or traumatic listhesis of the cervical spine. Electronically Signed   By: Tish Frederickson M.D.   On: 07/28/2023 19:17   DG Chest Port 1 View  Result Date: 07/28/2023 CLINICAL DATA:  Tachypnea EXAM: PORTABLE CHEST 1 VIEW COMPARISON:  06/24/2023, 06/03/2023, 03/30/2021 FINDINGS: Left-sided multi lead pacing device as before. Cardiomegaly without acute airspace disease, pleural effusion or pneumothorax. Interstitial opacities in the left mid to lower lung. Aortic atherosclerosis. IMPRESSION: Cardiomegaly. Mild reticular interstitial opacities in the left mid to lower lung, question atypical infection versus chronic interstitial change. Electronically Signed   By: Jasmine Pang M.D.   On: 07/28/2023 18:24    Labs: Basic Metabolic Panel: Recent Labs  Lab 07/28/23 1743 07/28/23 1940  NA 143 147*  K 4.2 4.6  CL 114*  --   CO2 17*  --   GLUCOSE 86  --   BUN 56*  --   CREATININE 3.07*  --   CALCIUM 8.4*  --    CBC: Recent Labs  Lab 07/28/23 1743 07/28/23 1940 07/29/23 1108  WBC 11.2*  --  8.1  NEUTROABS 8.0*  --   --  HGB 9.4* 10.5* 10.3*  HCT 29.5* 31.0* 32.8*  MCV 90.8  --  91.9  PLT 405*  --  429*   Microbiology: Results for orders placed or performed during the hospital encounter of 06/24/23  Blood culture (routine x 2)     Status: None   Collection Time: 06/25/23  2:12 AM   Specimen: BLOOD RIGHT ARM  Result Value Ref Range Status   Specimen Description BLOOD RIGHT ARM  Final   Special Requests   Final    BOTTLES DRAWN AEROBIC AND ANAEROBIC Blood Culture adequate volume   Culture   Final    NO GROWTH 5 DAYS Performed at [facility], [address] [place], [place] [address]    Report Status 06/30/2023 FINAL   Final  Blood culture (routine x 2)     Status: None   Collection Time: 06/25/23  2:14 AM   Specimen: BLOOD RIGHT HAND  Result Value Ref Range Status   Specimen Description BLOOD RIGHT HAND  Final   Special Requests   Final    BOTTLES DRAWN AEROBIC AND ANAEROBIC Blood Culture adequate volume   Culture   Final    NO GROWTH 5 DAYS Performed at [facility], [address], [place], [place] [address]    Report Status 06/30/2023 FINAL  Final  C Difficile Quick Screen w PCR reflex     Status: None   Collection Time: 06/25/23 10:07 AM   Specimen: STOOL  Result Value Ref Range Status   C Diff antigen NEGATIVE NEGATIVE Final   C Diff toxin NEGATIVE NEGATIVE Final   C Diff interpretation No C. difficile detected.  Final    Comment: Performed at [facility], [address], [place], [place] [address]   Time coordinating discharge: Over 30 minutes  Leeroy Bock, MD  [facility] 07/29/2023, 12:39 PM

## 2023-07-29 NOTE — Progress Notes (Signed)
Pts son [name] updated on plan of care. Pt being discharged back to [facility]. Son agreeable to plan.

## 2023-07-29 NOTE — Care Management CC44 (Signed)
Condition Code 44 Documentation Completed  Patient Details  Name: Betty [name] MRN: [id number] Date of Birth: Oct 31, 1946   Condition Code 44 given:  Yes Patient signature on Condition Code 44 notice:  Yes Documentation of 2 MD's agreement:  Yes Code 44 added to claim:  Yes    Janae Bridgeman, RN 07/29/2023, 3:24 PM

## 2023-07-29 NOTE — TOC Transition Note (Signed)
Transition of Care [facility]) - CM/SW Discharge Note   Patient Details  Name: Betty [name] MRN: [id number] Date of Birth: [birth year]  Transition of Care [facility]) CM/SW Contact:  Chaunce Winkels A [place], Theresia Majors Phone Number: 07/29/2023, 2:12 PM   Clinical Narrative:     Patient will DC to: [facility]  Anticipated DC date: 07/29/23  Family notified: Woodroe Chen  Transport by: Sharin Mons      Per MD patient ready for DC to [facility] . RN, patient, patient's family, and facility notified of DC. Discharge Summary and FL2 sent to facility. RN to call report prior to discharge ([phone], report [phone]. ). DC packet on chart. Ambulance transport requested for patient.     CSW will sign off for now as social work intervention is no longer needed. Please consult [place] again if new needs arise.   Final next level of care: Skilled Nursing Facility Barriers to Discharge: Barriers Resolved   Patient Goals and CMS Choice      Discharge Placement                Patient chooses bed at: [facility] Patient to be transferred to facility by: [facility] Name of family member notified: Zmya Lyng Patient and family notified of of transfer: 07/29/23  Discharge Plan and Services Additional resources added to the After Visit Summary for                                       Social Determinants of Health (SDOH) Interventions SDOH Screenings   Food Insecurity: No Food Insecurity (06/25/2023)  Housing: Low Risk  (06/25/2023)  Transportation Needs: No Transportation Needs (06/25/2023)  Utilities: Not At Risk (06/25/2023)  Alcohol Screen: Low Risk  (11/28/2021)  Depression (PHQ2-9): Low Risk  (11/28/2021)  Financial Resource Strain: Low Risk  (11/28/2021)  Physical Activity: Inactive (11/28/2021)  Social Connections: Unknown (11/28/2021)  Stress: No Stress Concern Present (11/28/2021)  Tobacco Use: Medium Risk (07/28/2023)     Readmission Risk Interventions     No  data to display

## 2023-08-01 DIAGNOSIS — M6281 Muscle weakness (generalized): Secondary | ICD-10-CM | POA: Diagnosis not present

## 2023-08-01 DIAGNOSIS — K3532 Acute appendicitis with perforation and localized peritonitis, without abscess: Secondary | ICD-10-CM | POA: Diagnosis not present

## 2023-08-01 DIAGNOSIS — I5042 Chronic combined systolic (congestive) and diastolic (congestive) heart failure: Secondary | ICD-10-CM | POA: Diagnosis not present

## 2023-08-01 DIAGNOSIS — R2689 Other abnormalities of gait and mobility: Secondary | ICD-10-CM | POA: Diagnosis not present

## 2023-08-04 DIAGNOSIS — R2689 Other abnormalities of gait and mobility: Secondary | ICD-10-CM | POA: Diagnosis not present

## 2023-08-04 DIAGNOSIS — M6281 Muscle weakness (generalized): Secondary | ICD-10-CM | POA: Diagnosis not present

## 2023-08-04 DIAGNOSIS — K3533 Acute appendicitis with perforation and localized peritonitis, with abscess: Secondary | ICD-10-CM | POA: Diagnosis not present

## 2023-08-04 DIAGNOSIS — R1311 Dysphagia, oral phase: Secondary | ICD-10-CM | POA: Diagnosis not present

## 2023-08-05 DIAGNOSIS — M6281 Muscle weakness (generalized): Secondary | ICD-10-CM | POA: Diagnosis not present

## 2023-08-05 DIAGNOSIS — R2689 Other abnormalities of gait and mobility: Secondary | ICD-10-CM | POA: Diagnosis not present

## 2023-08-05 DIAGNOSIS — R1311 Dysphagia, oral phase: Secondary | ICD-10-CM | POA: Diagnosis not present

## 2023-08-05 DIAGNOSIS — K3533 Acute appendicitis with perforation and localized peritonitis, with abscess: Secondary | ICD-10-CM | POA: Diagnosis not present

## 2023-08-05 LAB — CUP PACEART REMOTE DEVICE CHECK
Battery Remaining Longevity: 10 mo
Battery Voltage: 2.83 V
Brady Statistic AP VP Percent: 14.73 %
Brady Statistic AP VS Percent: 0.4 %
Brady Statistic AS VP Percent: 82.98 %
Brady Statistic AS VS Percent: 1.9 %
Brady Statistic RA Percent Paced: 15.51 %
Brady Statistic RV Percent Paced: 3.52 %
Date Time Interrogation Session: 20240929235421
Implantable Lead Connection Status: 753985
Implantable Lead Connection Status: 753985
Implantable Lead Connection Status: 753985
Implantable Lead Implant Date: 20190717
Implantable Lead Implant Date: 20190717
Implantable Lead Implant Date: 20190717
Implantable Lead Location: 753858
Implantable Lead Location: 753859
Implantable Lead Location: 753860
Implantable Lead Model: 5076
Implantable Lead Model: 5076
Implantable Pulse Generator Implant Date: 20190717
Lead Channel Impedance Value: 1026 Ohm
Lead Channel Impedance Value: 1064 Ohm
Lead Channel Impedance Value: 418 Ohm
Lead Channel Impedance Value: 437 Ohm
Lead Channel Impedance Value: 475 Ohm
Lead Channel Impedance Value: 532 Ohm
Lead Channel Impedance Value: 570 Ohm
Lead Channel Impedance Value: 570 Ohm
Lead Channel Impedance Value: 570 Ohm
Lead Channel Impedance Value: 646 Ohm
Lead Channel Impedance Value: 969 Ohm
Lead Channel Impedance Value: 988 Ohm
Lead Channel Impedance Value: 988 Ohm
Lead Channel Impedance Value: 988 Ohm
Lead Channel Pacing Threshold Amplitude: 0.5 V
Lead Channel Pacing Threshold Amplitude: 0.5 V
Lead Channel Pacing Threshold Amplitude: 3.5 V
Lead Channel Pacing Threshold Pulse Width: 0.4 ms
Lead Channel Pacing Threshold Pulse Width: 0.4 ms
Lead Channel Pacing Threshold Pulse Width: 0.6 ms
Lead Channel Sensing Intrinsic Amplitude: 2.375 mV
Lead Channel Sensing Intrinsic Amplitude: 2.375 mV
Lead Channel Sensing Intrinsic Amplitude: 22.5 mV
Lead Channel Sensing Intrinsic Amplitude: 22.5 mV
Lead Channel Setting Pacing Amplitude: 1.5 V
Lead Channel Setting Pacing Amplitude: 2 V
Lead Channel Setting Pacing Amplitude: 5 V
Lead Channel Setting Pacing Pulse Width: 0.4 ms
Lead Channel Setting Pacing Pulse Width: 0.6 ms
Lead Channel Setting Sensing Sensitivity: 1.2 mV
Zone Setting Status: 755011
Zone Setting Status: 755011

## 2023-08-06 DIAGNOSIS — R1311 Dysphagia, oral phase: Secondary | ICD-10-CM | POA: Diagnosis not present

## 2023-08-06 DIAGNOSIS — K3533 Acute appendicitis with perforation and localized peritonitis, with abscess: Secondary | ICD-10-CM | POA: Diagnosis not present

## 2023-08-06 DIAGNOSIS — M6281 Muscle weakness (generalized): Secondary | ICD-10-CM | POA: Diagnosis not present

## 2023-08-06 DIAGNOSIS — R2689 Other abnormalities of gait and mobility: Secondary | ICD-10-CM | POA: Diagnosis not present

## 2023-08-07 DIAGNOSIS — R1311 Dysphagia, oral phase: Secondary | ICD-10-CM | POA: Diagnosis not present

## 2023-08-07 DIAGNOSIS — M6281 Muscle weakness (generalized): Secondary | ICD-10-CM | POA: Diagnosis not present

## 2023-08-07 DIAGNOSIS — R2689 Other abnormalities of gait and mobility: Secondary | ICD-10-CM | POA: Diagnosis not present

## 2023-08-07 DIAGNOSIS — K3533 Acute appendicitis with perforation and localized peritonitis, with abscess: Secondary | ICD-10-CM | POA: Diagnosis not present

## 2023-08-08 ENCOUNTER — Other Ambulatory Visit: Payer: Self-pay | Admitting: *Deleted

## 2023-08-08 DIAGNOSIS — R2689 Other abnormalities of gait and mobility: Secondary | ICD-10-CM | POA: Diagnosis not present

## 2023-08-08 DIAGNOSIS — K3533 Acute appendicitis with perforation and localized peritonitis, with abscess: Secondary | ICD-10-CM | POA: Diagnosis not present

## 2023-08-08 DIAGNOSIS — R1311 Dysphagia, oral phase: Secondary | ICD-10-CM | POA: Diagnosis not present

## 2023-08-08 DIAGNOSIS — M6281 Muscle weakness (generalized): Secondary | ICD-10-CM | POA: Diagnosis not present

## 2023-08-08 NOTE — Patient Outreach (Signed)
Late entry for 08/07/23. Ms. Lasure resides in [facility] skilled nursing facility.  Screening for potential care coordination/ chronic care management services as a benefit of health plan and primary care provider.  Collaboration with [facility] psychotherapist. Ms. Shadduck will remain in LTC. Reports Ms. Pizzi is not eating much. Discussed Wilkie Aye will plan to request palliative care order.   No identifiable care coordination needs at this time.   [name], MSN, RN, BSN [place]  [facility] RN Post- Acute Care Coordinator Direct Dial: [phone]

## 2023-08-09 DIAGNOSIS — K3533 Acute appendicitis with perforation and localized peritonitis, with abscess: Secondary | ICD-10-CM | POA: Diagnosis not present

## 2023-08-09 DIAGNOSIS — R1311 Dysphagia, oral phase: Secondary | ICD-10-CM | POA: Diagnosis not present

## 2023-08-09 DIAGNOSIS — R2689 Other abnormalities of gait and mobility: Secondary | ICD-10-CM | POA: Diagnosis not present

## 2023-08-09 DIAGNOSIS — M6281 Muscle weakness (generalized): Secondary | ICD-10-CM | POA: Diagnosis not present

## 2023-08-10 DIAGNOSIS — K3533 Acute appendicitis with perforation and localized peritonitis, with abscess: Secondary | ICD-10-CM | POA: Diagnosis not present

## 2023-08-10 DIAGNOSIS — R1311 Dysphagia, oral phase: Secondary | ICD-10-CM | POA: Diagnosis not present

## 2023-08-10 DIAGNOSIS — M6281 Muscle weakness (generalized): Secondary | ICD-10-CM | POA: Diagnosis not present

## 2023-08-10 DIAGNOSIS — R2689 Other abnormalities of gait and mobility: Secondary | ICD-10-CM | POA: Diagnosis not present

## 2023-08-12 DIAGNOSIS — K3533 Acute appendicitis with perforation and localized peritonitis, with abscess: Secondary | ICD-10-CM | POA: Diagnosis not present

## 2023-08-12 DIAGNOSIS — R1311 Dysphagia, oral phase: Secondary | ICD-10-CM | POA: Diagnosis not present

## 2023-08-12 DIAGNOSIS — R2689 Other abnormalities of gait and mobility: Secondary | ICD-10-CM | POA: Diagnosis not present

## 2023-08-12 DIAGNOSIS — M6281 Muscle weakness (generalized): Secondary | ICD-10-CM | POA: Diagnosis not present

## 2023-08-13 DIAGNOSIS — R2689 Other abnormalities of gait and mobility: Secondary | ICD-10-CM | POA: Diagnosis not present

## 2023-08-13 DIAGNOSIS — K3533 Acute appendicitis with perforation and localized peritonitis, with abscess: Secondary | ICD-10-CM | POA: Diagnosis not present

## 2023-08-13 DIAGNOSIS — M6281 Muscle weakness (generalized): Secondary | ICD-10-CM | POA: Diagnosis not present

## 2023-08-13 DIAGNOSIS — R1311 Dysphagia, oral phase: Secondary | ICD-10-CM | POA: Diagnosis not present

## 2023-08-14 DIAGNOSIS — M6281 Muscle weakness (generalized): Secondary | ICD-10-CM | POA: Diagnosis not present

## 2023-08-14 DIAGNOSIS — R2689 Other abnormalities of gait and mobility: Secondary | ICD-10-CM | POA: Diagnosis not present

## 2023-08-14 DIAGNOSIS — K3533 Acute appendicitis with perforation and localized peritonitis, with abscess: Secondary | ICD-10-CM | POA: Diagnosis not present

## 2023-08-14 DIAGNOSIS — R1311 Dysphagia, oral phase: Secondary | ICD-10-CM | POA: Diagnosis not present

## 2023-08-15 DIAGNOSIS — K3533 Acute appendicitis with perforation and localized peritonitis, with abscess: Secondary | ICD-10-CM | POA: Diagnosis not present

## 2023-08-15 DIAGNOSIS — R2689 Other abnormalities of gait and mobility: Secondary | ICD-10-CM | POA: Diagnosis not present

## 2023-08-15 DIAGNOSIS — R1311 Dysphagia, oral phase: Secondary | ICD-10-CM | POA: Diagnosis not present

## 2023-08-15 DIAGNOSIS — M6281 Muscle weakness (generalized): Secondary | ICD-10-CM | POA: Diagnosis not present

## 2023-08-16 DIAGNOSIS — K3533 Acute appendicitis with perforation and localized peritonitis, with abscess: Secondary | ICD-10-CM | POA: Diagnosis not present

## 2023-08-16 DIAGNOSIS — M6281 Muscle weakness (generalized): Secondary | ICD-10-CM | POA: Diagnosis not present

## 2023-08-16 DIAGNOSIS — R1311 Dysphagia, oral phase: Secondary | ICD-10-CM | POA: Diagnosis not present

## 2023-08-16 DIAGNOSIS — R2689 Other abnormalities of gait and mobility: Secondary | ICD-10-CM | POA: Diagnosis not present

## 2023-08-18 DIAGNOSIS — M6281 Muscle weakness (generalized): Secondary | ICD-10-CM | POA: Diagnosis not present

## 2023-08-18 DIAGNOSIS — K3533 Acute appendicitis with perforation and localized peritonitis, with abscess: Secondary | ICD-10-CM | POA: Diagnosis not present

## 2023-08-18 DIAGNOSIS — R2689 Other abnormalities of gait and mobility: Secondary | ICD-10-CM | POA: Diagnosis not present

## 2023-08-18 DIAGNOSIS — R1311 Dysphagia, oral phase: Secondary | ICD-10-CM | POA: Diagnosis not present

## 2023-08-19 DIAGNOSIS — R2689 Other abnormalities of gait and mobility: Secondary | ICD-10-CM | POA: Diagnosis not present

## 2023-08-19 DIAGNOSIS — R1311 Dysphagia, oral phase: Secondary | ICD-10-CM | POA: Diagnosis not present

## 2023-08-19 DIAGNOSIS — K3533 Acute appendicitis with perforation and localized peritonitis, with abscess: Secondary | ICD-10-CM | POA: Diagnosis not present

## 2023-08-19 DIAGNOSIS — M6281 Muscle weakness (generalized): Secondary | ICD-10-CM | POA: Diagnosis not present

## 2023-08-20 ENCOUNTER — Ambulatory Visit: Payer: Medicare Other | Admitting: Podiatry

## 2023-08-20 DIAGNOSIS — R1311 Dysphagia, oral phase: Secondary | ICD-10-CM | POA: Diagnosis not present

## 2023-08-20 DIAGNOSIS — K3533 Acute appendicitis with perforation and localized peritonitis, with abscess: Secondary | ICD-10-CM | POA: Diagnosis not present

## 2023-08-20 DIAGNOSIS — M6281 Muscle weakness (generalized): Secondary | ICD-10-CM | POA: Diagnosis not present

## 2023-08-20 DIAGNOSIS — R2689 Other abnormalities of gait and mobility: Secondary | ICD-10-CM | POA: Diagnosis not present

## 2023-08-21 DIAGNOSIS — K3533 Acute appendicitis with perforation and localized peritonitis, with abscess: Secondary | ICD-10-CM | POA: Diagnosis not present

## 2023-08-21 DIAGNOSIS — R1311 Dysphagia, oral phase: Secondary | ICD-10-CM | POA: Diagnosis not present

## 2023-08-21 DIAGNOSIS — M6281 Muscle weakness (generalized): Secondary | ICD-10-CM | POA: Diagnosis not present

## 2023-08-21 DIAGNOSIS — R2689 Other abnormalities of gait and mobility: Secondary | ICD-10-CM | POA: Diagnosis not present

## 2023-08-22 DIAGNOSIS — R1311 Dysphagia, oral phase: Secondary | ICD-10-CM | POA: Diagnosis not present

## 2023-08-22 DIAGNOSIS — R2689 Other abnormalities of gait and mobility: Secondary | ICD-10-CM | POA: Diagnosis not present

## 2023-08-22 DIAGNOSIS — M6281 Muscle weakness (generalized): Secondary | ICD-10-CM | POA: Diagnosis not present

## 2023-08-22 DIAGNOSIS — K3533 Acute appendicitis with perforation and localized peritonitis, with abscess: Secondary | ICD-10-CM | POA: Diagnosis not present

## 2023-08-23 DIAGNOSIS — R2689 Other abnormalities of gait and mobility: Secondary | ICD-10-CM | POA: Diagnosis not present

## 2023-08-23 DIAGNOSIS — R1311 Dysphagia, oral phase: Secondary | ICD-10-CM | POA: Diagnosis not present

## 2023-08-23 DIAGNOSIS — K3533 Acute appendicitis with perforation and localized peritonitis, with abscess: Secondary | ICD-10-CM | POA: Diagnosis not present

## 2023-08-23 DIAGNOSIS — M6281 Muscle weakness (generalized): Secondary | ICD-10-CM | POA: Diagnosis not present

## 2023-08-26 DIAGNOSIS — K3533 Acute appendicitis with perforation and localized peritonitis, with abscess: Secondary | ICD-10-CM | POA: Diagnosis not present

## 2023-08-26 DIAGNOSIS — R2689 Other abnormalities of gait and mobility: Secondary | ICD-10-CM | POA: Diagnosis not present

## 2023-08-26 DIAGNOSIS — M6281 Muscle weakness (generalized): Secondary | ICD-10-CM | POA: Diagnosis not present

## 2023-08-26 DIAGNOSIS — R1311 Dysphagia, oral phase: Secondary | ICD-10-CM | POA: Diagnosis not present

## 2023-08-27 DIAGNOSIS — R1311 Dysphagia, oral phase: Secondary | ICD-10-CM | POA: Diagnosis not present

## 2023-08-27 DIAGNOSIS — M6281 Muscle weakness (generalized): Secondary | ICD-10-CM | POA: Diagnosis not present

## 2023-08-27 DIAGNOSIS — R2689 Other abnormalities of gait and mobility: Secondary | ICD-10-CM | POA: Diagnosis not present

## 2023-08-27 DIAGNOSIS — K3533 Acute appendicitis with perforation and localized peritonitis, with abscess: Secondary | ICD-10-CM | POA: Diagnosis not present

## 2023-08-28 DIAGNOSIS — K3533 Acute appendicitis with perforation and localized peritonitis, with abscess: Secondary | ICD-10-CM | POA: Diagnosis not present

## 2023-08-28 DIAGNOSIS — R1311 Dysphagia, oral phase: Secondary | ICD-10-CM | POA: Diagnosis not present

## 2023-08-28 DIAGNOSIS — M6281 Muscle weakness (generalized): Secondary | ICD-10-CM | POA: Diagnosis not present

## 2023-08-28 DIAGNOSIS — R2689 Other abnormalities of gait and mobility: Secondary | ICD-10-CM | POA: Diagnosis not present

## 2023-09-03 ENCOUNTER — Ambulatory Visit: Payer: Medicare Other | Admitting: Podiatry

## 2023-09-18 DIAGNOSIS — I5022 Chronic systolic (congestive) heart failure: Secondary | ICD-10-CM | POA: Diagnosis not present

## 2023-09-18 DIAGNOSIS — N185 Chronic kidney disease, stage 5: Secondary | ICD-10-CM | POA: Diagnosis not present

## 2023-09-18 DIAGNOSIS — Z993 Dependence on wheelchair: Secondary | ICD-10-CM | POA: Diagnosis not present

## 2023-09-18 DIAGNOSIS — M6281 Muscle weakness (generalized): Secondary | ICD-10-CM | POA: Diagnosis not present

## 2023-09-23 DIAGNOSIS — I5022 Chronic systolic (congestive) heart failure: Secondary | ICD-10-CM | POA: Diagnosis not present

## 2023-09-23 DIAGNOSIS — Z993 Dependence on wheelchair: Secondary | ICD-10-CM | POA: Diagnosis not present

## 2023-09-23 DIAGNOSIS — M6281 Muscle weakness (generalized): Secondary | ICD-10-CM | POA: Diagnosis not present

## 2023-09-23 DIAGNOSIS — N185 Chronic kidney disease, stage 5: Secondary | ICD-10-CM | POA: Diagnosis not present

## 2023-09-24 ENCOUNTER — Ambulatory Visit: Payer: Medicare Other | Admitting: Podiatry

## 2023-09-25 DIAGNOSIS — Z993 Dependence on wheelchair: Secondary | ICD-10-CM | POA: Diagnosis not present

## 2023-09-25 DIAGNOSIS — I5022 Chronic systolic (congestive) heart failure: Secondary | ICD-10-CM | POA: Diagnosis not present

## 2023-09-25 DIAGNOSIS — N185 Chronic kidney disease, stage 5: Secondary | ICD-10-CM | POA: Diagnosis not present

## 2023-09-25 DIAGNOSIS — M6281 Muscle weakness (generalized): Secondary | ICD-10-CM | POA: Diagnosis not present

## 2023-09-30 DIAGNOSIS — M6281 Muscle weakness (generalized): Secondary | ICD-10-CM | POA: Diagnosis not present

## 2023-09-30 DIAGNOSIS — I5022 Chronic systolic (congestive) heart failure: Secondary | ICD-10-CM | POA: Diagnosis not present

## 2023-09-30 DIAGNOSIS — Z993 Dependence on wheelchair: Secondary | ICD-10-CM | POA: Diagnosis not present

## 2023-09-30 DIAGNOSIS — N185 Chronic kidney disease, stage 5: Secondary | ICD-10-CM | POA: Diagnosis not present

## 2023-10-01 ENCOUNTER — Other Ambulatory Visit: Payer: Self-pay

## 2023-10-01 ENCOUNTER — Inpatient Hospital Stay (HOSPITAL_COMMUNITY)
Admission: EM | Admit: 2023-10-01 | Discharge: 2023-10-06 | DRG: 871 | Disposition: A | Discharge status: Hospice | Payer: Medicare Other | Source: Skilled Nursing Facility | Attending: Student | Admitting: Student

## 2023-10-01 ENCOUNTER — Emergency Department (HOSPITAL_COMMUNITY): Payer: Medicare Other

## 2023-10-01 DIAGNOSIS — I10 Essential (primary) hypertension: Secondary | ICD-10-CM | POA: Diagnosis not present

## 2023-10-01 DIAGNOSIS — A419 Sepsis, unspecified organism: Principal | ICD-10-CM | POA: Diagnosis present

## 2023-10-01 DIAGNOSIS — R7881 Bacteremia: Secondary | ICD-10-CM | POA: Diagnosis not present

## 2023-10-01 DIAGNOSIS — J449 Chronic obstructive pulmonary disease, unspecified: Secondary | ICD-10-CM | POA: Diagnosis present

## 2023-10-01 DIAGNOSIS — Z902 Acquired absence of lung [part of]: Secondary | ICD-10-CM | POA: Diagnosis not present

## 2023-10-01 DIAGNOSIS — I2510 Atherosclerotic heart disease of native coronary artery without angina pectoris: Secondary | ICD-10-CM | POA: Diagnosis present

## 2023-10-01 DIAGNOSIS — I130 Hypertensive heart and chronic kidney disease with heart failure and stage 1 through stage 4 chronic kidney disease, or unspecified chronic kidney disease: Secondary | ICD-10-CM | POA: Diagnosis present

## 2023-10-01 DIAGNOSIS — Z950 Presence of cardiac pacemaker: Secondary | ICD-10-CM

## 2023-10-01 DIAGNOSIS — K8020 Calculus of gallbladder without cholecystitis without obstruction: Secondary | ICD-10-CM | POA: Diagnosis not present

## 2023-10-01 DIAGNOSIS — N3000 Acute cystitis without hematuria: Secondary | ICD-10-CM | POA: Diagnosis not present

## 2023-10-01 DIAGNOSIS — I5022 Chronic systolic (congestive) heart failure: Secondary | ICD-10-CM | POA: Diagnosis present

## 2023-10-01 DIAGNOSIS — I959 Hypotension, unspecified: Secondary | ICD-10-CM | POA: Diagnosis not present

## 2023-10-01 DIAGNOSIS — G9341 Metabolic encephalopathy: Secondary | ICD-10-CM | POA: Diagnosis present

## 2023-10-01 DIAGNOSIS — F03C0 Unspecified dementia, severe, without behavioral disturbance, psychotic disturbance, mood disturbance, and anxiety: Secondary | ICD-10-CM | POA: Diagnosis not present

## 2023-10-01 DIAGNOSIS — J439 Emphysema, unspecified: Secondary | ICD-10-CM | POA: Diagnosis present

## 2023-10-01 DIAGNOSIS — Z681 Body mass index (BMI) 19 or less, adult: Secondary | ICD-10-CM | POA: Diagnosis not present

## 2023-10-01 DIAGNOSIS — E43 Unspecified severe protein-calorie malnutrition: Secondary | ICD-10-CM | POA: Diagnosis not present

## 2023-10-01 DIAGNOSIS — I252 Old myocardial infarction: Secondary | ICD-10-CM

## 2023-10-01 DIAGNOSIS — E860 Dehydration: Secondary | ICD-10-CM | POA: Diagnosis not present

## 2023-10-01 DIAGNOSIS — E870 Hyperosmolality and hypernatremia: Secondary | ICD-10-CM | POA: Diagnosis not present

## 2023-10-01 DIAGNOSIS — R531 Weakness: Principal | ICD-10-CM

## 2023-10-01 DIAGNOSIS — M542 Cervicalgia: Secondary | ICD-10-CM | POA: Diagnosis not present

## 2023-10-01 DIAGNOSIS — Z85118 Personal history of other malignant neoplasm of bronchus and lung: Secondary | ICD-10-CM

## 2023-10-01 DIAGNOSIS — Z7982 Long term (current) use of aspirin: Secondary | ICD-10-CM

## 2023-10-01 DIAGNOSIS — E876 Hypokalemia: Secondary | ICD-10-CM | POA: Diagnosis not present

## 2023-10-01 DIAGNOSIS — R918 Other nonspecific abnormal finding of lung field: Secondary | ICD-10-CM | POA: Diagnosis not present

## 2023-10-01 DIAGNOSIS — Z66 Do not resuscitate: Secondary | ICD-10-CM | POA: Diagnosis not present

## 2023-10-01 DIAGNOSIS — R627 Adult failure to thrive: Secondary | ICD-10-CM | POA: Diagnosis not present

## 2023-10-01 DIAGNOSIS — M6281 Muscle weakness (generalized): Secondary | ICD-10-CM | POA: Diagnosis not present

## 2023-10-01 DIAGNOSIS — I700 Atherosclerosis of aorta: Secondary | ICD-10-CM | POA: Diagnosis not present

## 2023-10-01 DIAGNOSIS — Z515 Encounter for palliative care: Secondary | ICD-10-CM

## 2023-10-01 DIAGNOSIS — Z87891 Personal history of nicotine dependence: Secondary | ICD-10-CM

## 2023-10-01 DIAGNOSIS — F01C0 Vascular dementia, severe, without behavioral disturbance, psychotic disturbance, mood disturbance, and anxiety: Secondary | ICD-10-CM | POA: Diagnosis present

## 2023-10-01 DIAGNOSIS — N179 Acute kidney failure, unspecified: Secondary | ICD-10-CM | POA: Diagnosis not present

## 2023-10-01 DIAGNOSIS — Z1612 Extended spectrum beta lactamase (ESBL) resistance: Secondary | ICD-10-CM | POA: Diagnosis not present

## 2023-10-01 DIAGNOSIS — W19XXXA Unspecified fall, initial encounter: Secondary | ICD-10-CM | POA: Diagnosis not present

## 2023-10-01 DIAGNOSIS — I129 Hypertensive chronic kidney disease with stage 1 through stage 4 chronic kidney disease, or unspecified chronic kidney disease: Secondary | ICD-10-CM | POA: Diagnosis not present

## 2023-10-01 DIAGNOSIS — E7800 Pure hypercholesterolemia, unspecified: Secondary | ICD-10-CM | POA: Diagnosis present

## 2023-10-01 DIAGNOSIS — N184 Chronic kidney disease, stage 4 (severe): Secondary | ICD-10-CM | POA: Diagnosis not present

## 2023-10-01 DIAGNOSIS — R4182 Altered mental status, unspecified: Secondary | ICD-10-CM | POA: Diagnosis not present

## 2023-10-01 DIAGNOSIS — E861 Hypovolemia: Secondary | ICD-10-CM | POA: Diagnosis present

## 2023-10-01 DIAGNOSIS — Z882 Allergy status to sulfonamides status: Secondary | ICD-10-CM

## 2023-10-01 DIAGNOSIS — B9620 Unspecified Escherichia coli [E. coli] as the cause of diseases classified elsewhere: Secondary | ICD-10-CM | POA: Diagnosis present

## 2023-10-01 DIAGNOSIS — Z993 Dependence on wheelchair: Secondary | ICD-10-CM

## 2023-10-01 DIAGNOSIS — Z7189 Other specified counseling: Secondary | ICD-10-CM | POA: Diagnosis not present

## 2023-10-01 DIAGNOSIS — G9340 Encephalopathy, unspecified: Secondary | ICD-10-CM | POA: Diagnosis present

## 2023-10-01 DIAGNOSIS — C3491 Malignant neoplasm of unspecified part of right bronchus or lung: Secondary | ICD-10-CM | POA: Diagnosis present

## 2023-10-01 DIAGNOSIS — K828 Other specified diseases of gallbladder: Secondary | ICD-10-CM | POA: Diagnosis not present

## 2023-10-01 DIAGNOSIS — M109 Gout, unspecified: Secondary | ICD-10-CM | POA: Diagnosis present

## 2023-10-01 DIAGNOSIS — Z9221 Personal history of antineoplastic chemotherapy: Secondary | ICD-10-CM

## 2023-10-01 DIAGNOSIS — E871 Hypo-osmolality and hyponatremia: Secondary | ICD-10-CM | POA: Diagnosis not present

## 2023-10-01 DIAGNOSIS — F0150 Vascular dementia without behavioral disturbance: Secondary | ICD-10-CM | POA: Diagnosis not present

## 2023-10-01 DIAGNOSIS — K5730 Diverticulosis of large intestine without perforation or abscess without bleeding: Secondary | ICD-10-CM | POA: Diagnosis not present

## 2023-10-01 DIAGNOSIS — R000 Tachycardia, unspecified: Secondary | ICD-10-CM | POA: Diagnosis not present

## 2023-10-01 DIAGNOSIS — Z79899 Other long term (current) drug therapy: Secondary | ICD-10-CM

## 2023-10-01 DIAGNOSIS — N390 Urinary tract infection, site not specified: Secondary | ICD-10-CM | POA: Diagnosis present

## 2023-10-01 DIAGNOSIS — N185 Chronic kidney disease, stage 5: Secondary | ICD-10-CM | POA: Diagnosis not present

## 2023-10-01 DIAGNOSIS — Z8249 Family history of ischemic heart disease and other diseases of the circulatory system: Secondary | ICD-10-CM

## 2023-10-01 DIAGNOSIS — R0689 Other abnormalities of breathing: Secondary | ICD-10-CM | POA: Diagnosis not present

## 2023-10-01 DIAGNOSIS — Z860100 Personal history of colon polyps, unspecified: Secondary | ICD-10-CM

## 2023-10-01 DIAGNOSIS — R110 Nausea: Secondary | ICD-10-CM | POA: Diagnosis not present

## 2023-10-01 DIAGNOSIS — K219 Gastro-esophageal reflux disease without esophagitis: Secondary | ICD-10-CM | POA: Diagnosis present

## 2023-10-01 DIAGNOSIS — R404 Transient alteration of awareness: Secondary | ICD-10-CM | POA: Diagnosis not present

## 2023-10-01 DIAGNOSIS — N19 Unspecified kidney failure: Secondary | ICD-10-CM

## 2023-10-01 DIAGNOSIS — Z860101 Personal history of adenomatous and serrated colon polyps: Secondary | ICD-10-CM

## 2023-10-01 DIAGNOSIS — E559 Vitamin D deficiency, unspecified: Secondary | ICD-10-CM | POA: Diagnosis present

## 2023-10-01 LAB — URINALYSIS, W/ REFLEX TO CULTURE (INFECTION SUSPECTED)
Bilirubin Urine: NEGATIVE
Glucose, UA: NEGATIVE mg/dL
Ketones, ur: NEGATIVE mg/dL
Nitrite: NEGATIVE
Protein, ur: 100 mg/dL — AB
Specific Gravity, Urine: 1.013 (ref 1.005–1.030)
WBC, UA: >50 WBC/hpf (ref 0–5)
pH: 5 (ref 5.0–8.0)

## 2023-10-01 LAB — CBC
HCT: 47.4 % — ABNORMAL HIGH (ref 36.0–46.0)
Hemoglobin: 13.2 g/dL (ref 12.0–15.0)
MCH: 28.1 pg (ref 26.0–34.0)
MCHC: 27.8 g/dL — ABNORMAL LOW (ref 30.0–36.0)
MCV: 100.9 fL — ABNORMAL HIGH (ref 80.0–100.0)
Platelets: 291 10*3/uL (ref 150–400)
RBC: 4.7 MIL/uL (ref 3.87–5.11)
RDW: 16.7 % — ABNORMAL HIGH (ref 11.5–15.5)
WBC: 14.5 10*3/uL — ABNORMAL HIGH (ref 4.0–10.5)
nRBC: 0.2 % (ref 0.0–0.2)

## 2023-10-01 LAB — BASIC METABOLIC PANEL
Anion gap: 21 — ABNORMAL HIGH (ref 5–15)
BUN: 152 mg/dL — ABNORMAL HIGH (ref 8–23)
CO2: 17 mmol/L — ABNORMAL LOW (ref 22–32)
Calcium: 9.2 mg/dL (ref 8.9–10.3)
Chloride: 127 mmol/L — ABNORMAL HIGH (ref 98–111)
Creatinine, Ser: 8.03 mg/dL — ABNORMAL HIGH (ref 0.44–1.00)
GFR, Estimated: 5 mL/min — ABNORMAL LOW (ref >60)
Glucose, Bld: 132 mg/dL — ABNORMAL HIGH (ref 70–99)
Potassium: 5 mmol/L (ref 3.5–5.1)
Sodium: 165 mmol/L (ref 135–145)

## 2023-10-01 MED ORDER — LACTATED RINGERS IV BOLUS
1000.0000 mL | Freq: Once | INTRAVENOUS | Status: AC
  Administered 2023-10-01: 1000 mL via INTRAVENOUS

## 2023-10-01 MED ORDER — SODIUM CHLORIDE 0.9 % IV SOLN
1.0000 g | Freq: Once | INTRAVENOUS | Status: AC
  Administered 2023-10-02: 1 g via INTRAVENOUS
  Filled 2023-10-01: qty 10

## 2023-10-01 NOTE — ED Triage Notes (Signed)
Pt to ED via [facility] from [facility] nursing home. Facility called EMS for AMS. Pt has dementia baseline but has been more altered than normal. Staff reports pt is usually able to mobilize with wheelchair and is verbal at baseline. Staff reports pt being more lethargic and less talkative today. Unsure of exact LKW. Pt also sent over for abnormal labs (elevated WBC and elevated sodium and potential UTI). Pt has pacemaker.   EMS: 118/100 120 HR 20 cap 30 RR 98% RA  22 L Hand

## 2023-10-01 NOTE — ED Provider Notes (Signed)
Care assumed at 2330.  Pt with hx/o dementia here from facility with AMS, abnormal labs.  UA concerning for UTI and pt started on abx.  Care assumed pending additional labs.   Labs with acute renal failure with hypernatremia.  She is already being treated with an IV fluid bolus, will provide additional IV fluids.  Given her acute renal failure CT stone study was obtained to rule out obstructing stone contributing to renal failure.  Discussed with patient's son over the phone.  He states that she would not want advanced intervention such as dialysis.  He does report that she was found on the ground and has been complaining of neck pain.  Plan to obtain CT head and C-spine given possible trauma.  Medicine consulted for admission for ongoing treatment.   Tilden Fossa, MD 10/02/23 [phone]

## 2023-10-01 NOTE — ED Provider Notes (Signed)
[place] EMERGENCY DEPARTMENT AT [facility] Provider Note   CSN: [id number] Arrival date & time: 10/01/23  2115     History  Chief Complaint  Patient presents with   Altered Mental Status    Betty [name] is a 77 y.o. female.  Pt with hx lung cancer, dementia, with increased generalized weakness, decreased po intake, decreased responsiveness, and concern for abnormal labs from PCP. Pt limited historian, dementia, level 5 caveat. No report of fevers. No report of trauma. Labs with patient from today show cr 8 (baseline 3), wbc elevated, UA w > 50 wbc.   The history is provided by the patient, medical records, a relative and the EMS personnel. The history is limited by the condition of the patient.  Altered Mental Status      Home Medications Prior to Admission medications   Medication Sig Start Date End Date Taking? Authorizing Provider  acetaminophen (TYLENOL) 325 MG tablet Take 650 mg by mouth every 6 (six) hours as needed for mild pain or moderate pain.   Yes [provider]  aspirin EC 81 MG tablet Take 81 mg by mouth at bedtime.   Yes [provider]  atorvastatin (LIPITOR) 40 MG tablet TAKE 1 TABLET BY MOUTH EVERY DAY AT 6 PM 07/12/23  Yes Duke Salvia, MD  cetirizine (ZYRTEC) 10 MG tablet Take 10 mg by mouth daily.   Yes [provider]  cholecalciferol (VITAMIN D3) 25 MCG (1000 UNIT) tablet Take 1,000 Units by mouth at bedtime.   Yes [provider]  donepezil (ARICEPT) 10 MG tablet Take 1 tablet (10 mg total) by mouth at bedtime. 12/14/21  Yes Glean Salvo, NP  febuxostat (ULORIC) 40 MG tablet Take 40 mg by mouth at bedtime. 03/27/23  Yes [provider]  isosorbide dinitrate (ISORDIL) 20 MG tablet TAKE 1 TABLET BY MOUTH THREE TIMES DAILY 08/08/22  Yes Lyn Records, MD  memantine (NAMENDA) 10 MG tablet Take 1 tablet (10 mg total) by mouth 2 (two) times daily. 06/28/22  Yes Glean Salvo, NP  metoprolol succinate  (TOPROL-XL) 50 MG 24 hr tablet Take 1 tablet (50 mg total) by mouth daily. Take with or immediately following a meal. 06/10/23  Yes Pemberton, Kathlynn Grate, MD  mirtazapine (REMERON) 7.5 MG tablet Take 7.5 mg by mouth at bedtime.   Yes [provider]  Multiple Vitamin (MULTIVITAMIN WITH MINERALS) TABS tablet Take 0.5 tablets by mouth at bedtime.   Yes [provider]  nitroGLYCERIN (NITROSTAT) 0.4 MG SL tablet Place 1 tablet (0.4 mg total) under the tongue every 5 (five) minutes x 3 doses as needed for chest pain. 04/26/18  Yes Berton Bon, NP  NUTRITIONAL SUPPLEMENTS PO Take 120 mLs by mouth daily.   Yes [provider]  pantoprazole (PROTONIX) 40 MG tablet Take 40 mg by mouth every morning. 03/14/23  Yes [provider]  QUEtiapine (SEROQUEL) 25 MG tablet Take 25 mg by mouth at bedtime. 05/28/23  Yes [provider]  sodium bicarbonate 650 MG tablet Take 1 tablet (650 mg total) by mouth 2 (two) times daily. 07/02/19  Yes Sheikh, Omair Latif, DO      Allergies    Sulfa antibiotics    Review of Systems   Review of Systems  Unable to perform ROS: Dementia    Physical Exam Updated Vital Signs BP 135/77 (BP Location: Right Arm)   Pulse 98   Temp 97.6 F (36.4 C) (Oral)   Resp  17   Ht 1.626 m (5\' 4" )   Wt 45.1 kg   SpO2 100%   BMI 17.07 kg/m  Physical Exam Vitals and nursing note reviewed.  Constitutional:      Appearance: Normal appearance. She is well-developed.  HENT:     Head: Atraumatic.     Nose: Nose normal.     Mouth/Throat:     Mouth: Mucous membranes are dry.     Comments: Dry mucous membranes Eyes:     General: No scleral icterus.    Conjunctiva/sclera: Conjunctivae normal.     Pupils: Pupils are equal, round, and reactive to light.  Neck:     Trachea: No tracheal deviation.     Comments: No stiffness or rigidity.  Cardiovascular:     Rate and Rhythm: Regular rhythm. Tachycardia present.     Pulses: Normal pulses.      Heart sounds: Normal heart sounds. No murmur heard.    No friction rub. No gallop.  Pulmonary:     Effort: Pulmonary effort is normal. No respiratory distress.     Breath sounds: Normal breath sounds.     Comments: Pacemaker left chest without sign of infection to site.  Abdominal:     General: Bowel sounds are normal. There is no distension.     Palpations: Abdomen is soft.     Tenderness: There is no abdominal tenderness. There is no guarding.  Genitourinary:    Comments: No cva tenderness.  Musculoskeletal:        General: No swelling or tenderness.     Cervical back: Normal range of motion and neck supple. No rigidity. No muscular tenderness.  Lymphadenopathy:     Cervical: No cervical adenopathy.  Skin:    General: Skin is warm and dry.     Findings: No rash.  Neurological:     Mental Status: She is alert.     Comments: Alert, responds yes/no to some questions. Moves bilateral extremities purposefully. (Reportedly transfers and in wheelchair at baseline, not ambulatory).      ED Results / Procedures / Treatments   Labs (all labs ordered are listed, but only abnormal results are displayed) Results for orders placed or performed during the hospital encounter of 10/01/23  Blood culture (routine x 2)   Specimen: BLOOD RIGHT HAND  Result Value Ref Range   Specimen Description BLOOD RIGHT HAND    Special Requests      BOTTLES DRAWN AEROBIC AND ANAEROBIC Blood Culture results may not be optimal due to an inadequate volume of blood received in culture bottles   Culture      NO GROWTH < 12 HOURS Performed at [facility], [address] [place], [place] [address]    Report Status PENDING   Blood culture (routine x 2)   Specimen: BLOOD RIGHT HAND  Result Value Ref Range   Specimen Description BLOOD RIGHT HAND    Special Requests      BOTTLES DRAWN AEROBIC AND ANAEROBIC Blood Culture adequate volume   Culture      NO GROWTH < 12 HOURS Performed at [facility]  [facility], [address], [place], [place] [address]    Report Status PENDING   CBC  Result Value Ref Range   WBC 14.5 (H) 4.0 - 10.5 K/uL   RBC 4.70 3.87 - 5.11 MIL/uL   Hemoglobin 13.2 12.0 - 15.0 g/dL   HCT 86.5 (H) 78.4 - 69.6 %   MCV 100.9 (H) 80.0 - 100.0 fL  MCH 28.1 26.0 - 34.0 pg   MCHC 27.8 (L) 30.0 - 36.0 g/dL   RDW 40.9 (H) 81.1 - 91.4 %   Platelets 291 150 - 400 K/uL   nRBC 0.2 0.0 - 0.2 %  Basic metabolic panel  Result Value Ref Range   Sodium 165 (HH) 135 - 145 mmol/L   Potassium 5.0 3.5 - 5.1 mmol/L   Chloride 127 (H) 98 - 111 mmol/L   CO2 17 (L) 22 - 32 mmol/L   Glucose, Bld 132 (H) 70 - 99 mg/dL   BUN 782 (H) 8 - 23 mg/dL   Creatinine, Ser 9.56 (H) 0.44 - 1.00 mg/dL   Calcium 9.2 8.9 - 21.3 mg/dL   GFR, Estimated 5 (L) >60 mL/min   Anion gap 21 (H) 5 - 15  Urinalysis, w/ Reflex to Culture (Infection Suspected) -Urine, Catheterized  Result Value Ref Range   Specimen Source URINE, CLEAN CATCH    Color, Urine YELLOW YELLOW   APPearance TURBID (A) CLEAR   Specific Gravity, Urine 1.013 1.005 - 1.030   pH 5.0 5.0 - 8.0   Glucose, UA NEGATIVE NEGATIVE mg/dL   Hgb urine dipstick MODERATE (A) NEGATIVE   Bilirubin Urine NEGATIVE NEGATIVE   Ketones, ur NEGATIVE NEGATIVE mg/dL   Protein, ur 086 (A) NEGATIVE mg/dL   Nitrite NEGATIVE NEGATIVE   Leukocytes,Ua MODERATE (A) NEGATIVE   RBC / HPF 21-50 0 - 5 RBC/hpf   WBC, UA >50 0 - 5 WBC/hpf   Bacteria, UA MANY (A) NONE SEEN   Squamous Epithelial / HPF 0-5 0 - 5 /HPF   Mucus PRESENT   Hepatic function panel  Result Value Ref Range   Total Protein 8.6 (H) 6.5 - 8.1 g/dL   Albumin 3.0 (L) 3.5 - 5.0 g/dL   AST 19 15 - 41 U/L   ALT 11 0 - 44 U/L   Alkaline Phosphatase 89 38 - 126 U/L   Total Bilirubin 0.8 <1.2 mg/dL   Bilirubin, Direct 0.1 0.0 - 0.2 mg/dL   Indirect Bilirubin 0.7 0.3 - 0.9 mg/dL  Basic metabolic panel  Result Value Ref Range   Sodium 165 (HH) 135 - 145 mmol/L   Potassium 5.0 3.5 - 5.1 mmol/L   Chloride  124 (H) 98 - 111 mmol/L   CO2 22 22 - 32 mmol/L   Glucose, Bld 116 (H) 70 - 99 mg/dL   BUN 578 (H) 8 - 23 mg/dL   Creatinine, Ser 4.69 (H) 0.44 - 1.00 mg/dL   Calcium 9.0 8.9 - 62.9 mg/dL   GFR, Estimated 5 (L) >60 mL/min   Anion gap 19 (H) 5 - 15  Basic metabolic panel  Result Value Ref Range   Sodium 164 (HH) 135 - 145 mmol/L   Potassium 4.3 3.5 - 5.1 mmol/L   Chloride 125 (H) 98 - 111 mmol/L   CO2 20 (L) 22 - 32 mmol/L   Glucose, Bld 160 (H) 70 - 99 mg/dL   BUN 528 (H) 8 - 23 mg/dL   Creatinine, Ser 4.13 (H) 0.44 - 1.00 mg/dL   Calcium 8.8 (L) 8.9 - 10.3 mg/dL   GFR, Estimated 5 (L) >60 mL/min   Anion gap 19 (H) 5 - 15  CBC  Result Value Ref Range   WBC 19.7 (H) 4.0 - 10.5 K/uL   RBC 3.89 3.87 - 5.11 MIL/uL   Hemoglobin 11.3 (L) 12.0 - 15.0 g/dL   HCT 24.4 (L) 01.0 - 27.2 %   MCV 90.5  80.0 - 100.0 fL   MCH 29.0 26.0 - 34.0 pg   MCHC 32.1 30.0 - 36.0 g/dL   RDW 09.8 (H) 11.9 - 14.7 %   Platelets 263 150 - 400 K/uL   nRBC 0.0 0.0 - 0.2 %  I-Stat Lactic Acid  Result Value Ref Range   Lactic Acid, Venous 1.9 0.5 - 1.9 mmol/L     EKG EKG Interpretation Date/Time:  Tuesday October 01 2023 21:32:53 EST Ventricular Rate:  115 PR Interval:  119 QRS Duration:  135 QT Interval:  407 QTC Calculation: 563 R Axis:   0  Text Interpretation: Electronic ventricular pacemaker Confirmed by Cathren Laine ([id number]) on 10/01/2023 9:49:59 PM  Radiology CT Head Wo Contrast  Result Date: 10/02/2023 CLINICAL DATA:  Mental status changes, unknown cause, neck trauma. EXAM: CT HEAD WITHOUT CONTRAST CT CERVICAL SPINE WITHOUT CONTRAST TECHNIQUE: Multidetector CT imaging of the head and cervical spine was performed following the standard protocol without intravenous contrast. Multiplanar CT image reconstructions of the cervical spine were also generated. RADIATION DOSE REDUCTION: This exam was performed according to the departmental dose-optimization program which includes automated exposure  control, adjustment of the mA and/or kV according to patient size and/or use of iterative reconstruction technique. COMPARISON:  CT scan head and cervical spine both 07/28/2023. FINDINGS: CT HEAD FINDINGS Brain: There is moderately advanced cerebral and mild cerebellar atrophy, moderate atrophic ventriculomegaly and advanced small vessel disease of the cerebral white matter. There are bilateral small chronic gangliocapsular lacunar infarcts. No new infarct is suspected, no hemorrhage, mass or mass effect and no midline shift. Small rounded partially calcified mass again noted in the inferior aspect of the right sylvian fissure measuring 9.2 x 8.1 mm on 7:23, also visible on coronal reconstruction image 46. 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 There is no positive mass effect. Vascular: There are calcific plaques in the carotid siphons and both distal vertebral arteries. No hyperdense central vessel is seen. Skull: Negative for fractures or focal lesions. Sinuses/Orbits: No acute findings. Old unilateral left lens replacement. 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 Other: None. CT CERVICAL SPINE FINDINGS Alignment: The patient's head is turned to the right. Again seen is a minimal grade 1 anterolisthesis at C2-3 and C4-5, mild reversal of the cervical lordosis, and chronic bone-on-bone anterior atlantodental joint space loss with osteophytes. C1 appears normally positioned on C2 considering the patient has had position. No new or traumatic alignment abnormality is suspected. Skull base and vertebrae: Osteopenia. No acute fracture is evident. No primary bone lesion or focal pathologic process. Soft tissues and spinal canal: No prevertebral fluid or swelling. No visible canal hematoma. The carotid bifurcations are heavily calcified. No laryngeal or thyroid mass. Disc levels: The discs are normal in  heights at C2-3, C7-T1, referable collapsed at the former levels in between There are bidirectional endplate spurs most levels, with posterior disc osteophyte complexes deforming the ventral cord surface from C3-4 through C6-7 but without evidence of frank cord compression herniated discs. Facet joint and uncinate osteophytes are noted most levels. There is multilevel acquired foraminal stenosis which is greatest at C6-7 on the left-greater-than-right, and at C5-6 on the right. Upper chest: Lung apices are emphysematous. There is asymmetric coarse scar-like opacity in left apex. Bipolar pacemaker wiring partially visible. Aortic atherosclerosis. Other: None. IMPRESSION: 1. No acute intracranial CT findings or depressed skull fractures. 2. Atrophy, small-vessel disease and  chronic lacunar infarcts. 3. 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 Further workup if clinically warranted could include CTA or MRI/MRA. 4. Osteopenia and degenerative change of the cervical spine without evidence of fractures. 5. Reversal of the cervical lordosis and minimal chronic grade 1 anterolisthesis at C2-3 and C4-5. 6. Aortic and carotid atherosclerosis. 7. Emphysema.  Asymmetric coarse scar-like opacity in the left apex. Aortic Atherosclerosis (ICD10-I70.0) and Emphysema (ICD10-J43.9). Electronically Signed   By: Almira Bar M.D.   On: 10/02/2023 03:19   CT Cervical Spine Wo Contrast  Result Date: 10/02/2023 CLINICAL DATA:  Mental status changes, unknown cause, neck trauma. EXAM: CT HEAD WITHOUT CONTRAST CT CERVICAL SPINE WITHOUT CONTRAST TECHNIQUE: Multidetector CT imaging of the head and cervical spine was performed following the standard protocol without intravenous contrast. Multiplanar CT image reconstructions of the cervical spine were also  generated. RADIATION DOSE REDUCTION: This exam was performed according to the departmental dose-optimization program which includes automated exposure control, adjustment of the mA and/or kV according to patient size and/or use of iterative reconstruction technique. COMPARISON:  CT scan head and cervical spine both 07/28/2023. FINDINGS: CT HEAD FINDINGS Brain: There is moderately advanced cerebral and mild cerebellar atrophy, moderate atrophic ventriculomegaly and advanced small vessel disease of the cerebral white matter. There are bilateral small chronic gangliocapsular lacunar infarcts. No new infarct is suspected, no hemorrhage, mass or mass effect and no midline shift. Small rounded partially calcified mass again noted in the inferior aspect of the right sylvian fissure measuring 9.2 x 8.1 mm on 7:23, also visible on coronal reconstruction image 46. 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 There is no positive mass effect. Vascular: There are calcific plaques in the carotid siphons and both distal vertebral arteries. No hyperdense central vessel is seen. Skull: Negative for fractures or focal lesions. Sinuses/Orbits: No acute findings. Old unilateral left lens replacement. 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 Other: None. CT CERVICAL SPINE FINDINGS Alignment: The patient's head is turned to the right. Again seen is a minimal grade 1 anterolisthesis at C2-3 and C4-5, mild reversal of the cervical lordosis, and chronic bone-on-bone anterior atlantodental joint space loss with osteophytes. C1 appears normally positioned on C2 considering the patient has had position. No new or traumatic alignment abnormality is suspected. Skull base and vertebrae: Osteopenia. No acute fracture is evident. No primary bone lesion or focal pathologic process. Soft tissues and spinal canal: No prevertebral fluid or  swelling. No visible canal hematoma. The carotid bifurcations are heavily calcified. No laryngeal or thyroid mass. Disc levels: The discs are normal in heights at C2-3, C7-T1, referable collapsed at the former levels in between There are bidirectional endplate spurs most levels, with posterior disc osteophyte complexes deforming the ventral cord surface from C3-4 through C6-7 but without evidence of frank cord compression herniated discs. Facet joint and uncinate osteophytes are noted most levels. There is multilevel acquired foraminal stenosis which is greatest at C6-7 on the left-greater-than-right, and at C5-6 on the right. Upper chest: Lung apices are emphysematous. There is asymmetric coarse scar-like opacity in left apex. Bipolar pacemaker wiring partially visible. Aortic atherosclerosis. Other: None. IMPRESSION: 1. No acute intracranial CT findings or depressed skull fractures. 2. Atrophy, small-vessel disease and chronic lacunar  infarcts. 3. 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 Further workup if clinically warranted could include CTA or MRI/MRA. 4. Osteopenia and degenerative change of the cervical spine without evidence of fractures. 5. Reversal of the cervical lordosis and minimal chronic grade 1 anterolisthesis at C2-3 and C4-5. 6. Aortic and carotid atherosclerosis. 7. Emphysema.  Asymmetric coarse scar-like opacity in the left apex. Aortic Atherosclerosis (ICD10-I70.0) and Emphysema (ICD10-J43.9). Electronically Signed   By: Almira Bar M.D.   On: 10/02/2023 03:19   CT Renal Stone Study  Result Date: 10/02/2023 CLINICAL DATA:  Abdominal pain. EXAM: CT ABDOMEN AND PELVIS WITHOUT CONTRAST TECHNIQUE: Multidetector CT imaging of the abdomen and pelvis was performed following the standard protocol without IV contrast.  RADIATION DOSE REDUCTION: This exam was performed according to the departmental dose-optimization program which includes automated exposure control, adjustment of the mA and/or kV according to patient size and/or use of iterative reconstruction technique. COMPARISON:  CT chest abdomen and pelvis 06/25/2023 FINDINGS: Lower chest: No acute abnormality. Hepatobiliary: The gallbladder is distended. Gallstones are present. There is no biliary ductal dilatation. No focal liver lesions are seen. Pancreas: Unremarkable. No pancreatic ductal dilatation or surrounding inflammatory changes. Spleen: Normal in size without focal abnormality. Adrenals/Urinary Tract: Bilateral renal cysts are again seen. The largest is in the right kidney measuring up to 2.1 cm. There are no urinary tract calculi. There is no hydronephrosis. The adrenal glands and bladder are within normal limits. Stomach/Bowel: The proximal appendix appears within normal limits. The distal appendix is not well delineated in the pelvis. No focal inflammation, bowel obstruction or free air identified. There is sigmoid colon diverticulosis. Vascular/Lymphatic: Aortic atherosclerosis. No enlarged abdominal or pelvic lymph nodes. Reproductive: Coarse rounded calcifications are seen in the pelvis which likely represent fibroids measuring up to 2.7 cm. The ovaries are not well delineated. Other: No significant free fluid or free air. No abdominal wall hernia. Small amount of subcutaneous air in the anterior lower right abdominal wall likely represents medication injection site. There some mild subcutaneous stranding in the right inguinal region and along the right lateral body wall. Musculoskeletal: Degenerative changes affect the hips and spine. IMPRESSION: 1. Distended gallbladder with gallstones. Correlate clinically for cholecystitis. 2. Colonic diverticulosis. 3. Uterine fibroids. 4. Mild subcutaneous stranding in the right inguinal region and along the right  lateral body wall. Correlate clinically for cellulitis. 5. Right Bosniak I benign renal cyst measuring 2.1 cm. No follow-up imaging is recommended. JACR 2018 Feb; 264-273, Management of the Incidental Renal Mass on CT, [employer] 2021; [id number], Bosniak Classification of Cystic Renal Masses, Version 2019. Aortic Atherosclerosis (ICD10-I70.0). Electronically Signed   By: Darliss Cheney M.D.   On: 10/02/2023 01:58   DG Chest Port 1 View  Result Date: 10/01/2023 CLINICAL DATA:  Weakness EXAM: PORTABLE CHEST 1 VIEW COMPARISON:  07/28/2023 FINDINGS: Stable cardiomediastinal silhouette. Aortic atherosclerotic calcification. Left chest wall CRT-P. Similar interstitial opacities in the left mid and lower lung. No pleural effusion or pneumothorax. IMPRESSION: Similar interstitial opacities in the left mid and lower lung which may be chronic or due to atypical infection or aspiration. Electronically Signed   By: Minerva Fester M.D.   On: 10/01/2023 23:56    Procedures Procedures    Medications Ordered in ED Medications  heparin injection 5,000 Units (5,000 Units Subcutaneous Given 10/02/23 0536)  sodium chloride flush (NS) 0.9 % injection 3 mL (3 mLs Intravenous Given 10/02/23 1217)  acetaminophen (TYLENOL) tablet 650 mg (has no administration in time range)    Or  acetaminophen (TYLENOL) suppository 650 mg (has no administration in time range)  prochlorperazine (COMPAZINE) injection 5 mg (has no administration in time range)  cefTRIAXone (ROCEPHIN) 1 g in sodium chloride 0.9 % 100 mL IVPB (has no administration in time range)  ipratropium-albuterol (DUONEB) 0.5-2.5 (3) MG/3ML nebulizer solution 3 mL (has no administration in time range)  dextrose 5 % solution ( Intravenous Rate/Dose Change 10/02/23 1217)  lactated ringers bolus 1,000 mL (0 mLs Intravenous Stopped 10/02/23 0155)  cefTRIAXone (ROCEPHIN) 1 g in sodium chloride 0.9 % 100 mL IVPB (0 g Intravenous Stopped 10/02/23 0213)    ED Course/  Medical Decision Making/ A&P                                 Medical Decision Making Problems Addressed: Acute UTI: acute illness or injury with systemic symptoms that poses a threat to life or bodily functions AKI (acute kidney injury) [facility]): acute illness or injury with systemic symptoms that poses a threat to life or bodily functions Dehydration: acute illness or injury with systemic symptoms that poses a threat to life or bodily functions Generalized weakness: acute illness or injury with systemic symptoms that poses a threat to life or bodily functions Hypernatremia: acute illness or injury with systemic symptoms that poses a threat to life or bodily functions Stage 4 chronic kidney disease (HCC): chronic illness or injury with exacerbation, progression, or side effects of treatment that poses a threat to life or bodily functions Uremia: acute illness or injury with systemic symptoms that poses a threat to life or bodily functions  Amount and/or Complexity of Data Reviewed Independent Historian: EMS    Details: Ems ,family hx External Data Reviewed: notes. Labs: ordered. Decision-making details documented in ED Course. Radiology: ordered and independent interpretation performed. Decision-making details documented in ED Course. ECG/medicine tests: ordered and independent interpretation performed. Decision-making details documented in ED Course. Discussion of management or test interpretation with external provider(s): Hospitalists  Risk Prescription drug management. Decision regarding hospitalization.   Iv ns. Continuous pulse ox and cardiac monitoring. Labs ordered/sent. Imaging ordered.   Differential diagnosis includes uti, dehydration, sepsis, aki, etc. Dispo decision including potential need for admission considered - will get labs and imaging and reassess.   Reviewed nursing notes and prior charts for additional history. External reports reviewed. Additional history from:  ems, family.   Cardiac monitor: paced rhythm. Rate 110.  Cultures sent. Iv abx.   Labs reviewed/interpreted by me (with patient, from outside facility) - wbc v high. Ua w > 50 wbc. Cr 8 c/w aki.   Xrays reviewed/interpreted by me - no pna.   LR bolus.   2310, labs pending. Signed out to DR Madilyn Hook to check pending labs, and call hospitalists for admission.   Subsequent labs include na very high, iv hydration, bun/cr very high c/w uremia and aki - will get bladder scan and imaging to r/o obstructive process, iv hydration.   CRITICAL CARE RE: acute renal failure with uremia, dehydration, severe hypernatremia, acute uti, encephalopathy Performed by: Suzi Roots Total critical care time: 45 minutes Critical care time was exclusive of separately billable procedures and treating other patients. Critical care was necessary to treat or prevent imminent or life-threatening deterioration. Critical care was  time spent personally by me on the following activities: development of treatment plan with patient and/or surrogate as well as nursing, discussions with consultants, evaluation of patient's response to treatment, examination of patient, obtaining history from patient or surrogate, ordering and performing treatments and interventions, ordering and review of laboratory studies, ordering and review of radiographic studies, pulse oximetry and re-evaluation of patient's condition.         Final Clinical Impression(s) / ED Diagnoses Final diagnoses:  Generalized weakness  Dehydration  AKI (acute kidney injury) (HCC)  Stage 4 chronic kidney disease (HCC)  Acute UTI    Rx / DC Orders ED Discharge Orders     None            Cathren Laine, MD 10/02/23 1450

## 2023-10-02 ENCOUNTER — Other Ambulatory Visit: Payer: Self-pay

## 2023-10-02 ENCOUNTER — Encounter (HOSPITAL_COMMUNITY): Payer: Self-pay | Admitting: Family Medicine

## 2023-10-02 ENCOUNTER — Emergency Department (HOSPITAL_COMMUNITY): Payer: Medicare Other

## 2023-10-02 DIAGNOSIS — E860 Dehydration: Secondary | ICD-10-CM

## 2023-10-02 DIAGNOSIS — I10 Essential (primary) hypertension: Secondary | ICD-10-CM | POA: Diagnosis not present

## 2023-10-02 DIAGNOSIS — N3000 Acute cystitis without hematuria: Secondary | ICD-10-CM

## 2023-10-02 DIAGNOSIS — N184 Chronic kidney disease, stage 4 (severe): Secondary | ICD-10-CM

## 2023-10-02 DIAGNOSIS — N179 Acute kidney failure, unspecified: Secondary | ICD-10-CM

## 2023-10-02 DIAGNOSIS — N390 Urinary tract infection, site not specified: Secondary | ICD-10-CM | POA: Diagnosis present

## 2023-10-02 DIAGNOSIS — C3491 Malignant neoplasm of unspecified part of right bronchus or lung: Secondary | ICD-10-CM | POA: Diagnosis not present

## 2023-10-02 DIAGNOSIS — S0990XA Unspecified injury of head, initial encounter: Secondary | ICD-10-CM | POA: Diagnosis not present

## 2023-10-02 DIAGNOSIS — E7800 Pure hypercholesterolemia, unspecified: Secondary | ICD-10-CM | POA: Diagnosis present

## 2023-10-02 DIAGNOSIS — G9341 Metabolic encephalopathy: Secondary | ICD-10-CM | POA: Diagnosis present

## 2023-10-02 DIAGNOSIS — R627 Adult failure to thrive: Secondary | ICD-10-CM | POA: Diagnosis present

## 2023-10-02 DIAGNOSIS — G9340 Encephalopathy, unspecified: Secondary | ICD-10-CM | POA: Diagnosis not present

## 2023-10-02 DIAGNOSIS — E870 Hyperosmolality and hypernatremia: Secondary | ICD-10-CM | POA: Diagnosis not present

## 2023-10-02 DIAGNOSIS — F0150 Vascular dementia without behavioral disturbance: Secondary | ICD-10-CM | POA: Diagnosis not present

## 2023-10-02 DIAGNOSIS — I5022 Chronic systolic (congestive) heart failure: Secondary | ICD-10-CM

## 2023-10-02 DIAGNOSIS — Z7189 Other specified counseling: Secondary | ICD-10-CM | POA: Diagnosis not present

## 2023-10-02 DIAGNOSIS — G319 Degenerative disease of nervous system, unspecified: Secondary | ICD-10-CM | POA: Diagnosis not present

## 2023-10-02 DIAGNOSIS — R7881 Bacteremia: Secondary | ICD-10-CM | POA: Diagnosis present

## 2023-10-02 DIAGNOSIS — Z681 Body mass index (BMI) 19 or less, adult: Secondary | ICD-10-CM | POA: Diagnosis not present

## 2023-10-02 DIAGNOSIS — E43 Unspecified severe protein-calorie malnutrition: Secondary | ICD-10-CM | POA: Diagnosis present

## 2023-10-02 DIAGNOSIS — I2510 Atherosclerotic heart disease of native coronary artery without angina pectoris: Secondary | ICD-10-CM | POA: Diagnosis not present

## 2023-10-02 DIAGNOSIS — E861 Hypovolemia: Secondary | ICD-10-CM | POA: Diagnosis present

## 2023-10-02 DIAGNOSIS — J449 Chronic obstructive pulmonary disease, unspecified: Secondary | ICD-10-CM | POA: Diagnosis not present

## 2023-10-02 DIAGNOSIS — F03C0 Unspecified dementia, severe, without behavioral disturbance, psychotic disturbance, mood disturbance, and anxiety: Secondary | ICD-10-CM

## 2023-10-02 DIAGNOSIS — S199XXA Unspecified injury of neck, initial encounter: Secondary | ICD-10-CM | POA: Diagnosis not present

## 2023-10-02 DIAGNOSIS — K828 Other specified diseases of gallbladder: Secondary | ICD-10-CM | POA: Diagnosis not present

## 2023-10-02 DIAGNOSIS — Z902 Acquired absence of lung [part of]: Secondary | ICD-10-CM | POA: Diagnosis not present

## 2023-10-02 DIAGNOSIS — K5730 Diverticulosis of large intestine without perforation or abscess without bleeding: Secondary | ICD-10-CM | POA: Diagnosis not present

## 2023-10-02 DIAGNOSIS — Z515 Encounter for palliative care: Secondary | ICD-10-CM | POA: Diagnosis not present

## 2023-10-02 DIAGNOSIS — J439 Emphysema, unspecified: Secondary | ICD-10-CM | POA: Diagnosis present

## 2023-10-02 DIAGNOSIS — I130 Hypertensive heart and chronic kidney disease with heart failure and stage 1 through stage 4 chronic kidney disease, or unspecified chronic kidney disease: Secondary | ICD-10-CM | POA: Diagnosis present

## 2023-10-02 DIAGNOSIS — Z1612 Extended spectrum beta lactamase (ESBL) resistance: Secondary | ICD-10-CM | POA: Diagnosis present

## 2023-10-02 DIAGNOSIS — Z66 Do not resuscitate: Secondary | ICD-10-CM | POA: Diagnosis present

## 2023-10-02 DIAGNOSIS — A419 Sepsis, unspecified organism: Secondary | ICD-10-CM | POA: Diagnosis present

## 2023-10-02 DIAGNOSIS — K8020 Calculus of gallbladder without cholecystitis without obstruction: Secondary | ICD-10-CM | POA: Diagnosis not present

## 2023-10-02 DIAGNOSIS — R4182 Altered mental status, unspecified: Secondary | ICD-10-CM | POA: Diagnosis present

## 2023-10-02 DIAGNOSIS — F01C0 Vascular dementia, severe, without behavioral disturbance, psychotic disturbance, mood disturbance, and anxiety: Secondary | ICD-10-CM | POA: Diagnosis present

## 2023-10-02 DIAGNOSIS — M542 Cervicalgia: Secondary | ICD-10-CM | POA: Diagnosis present

## 2023-10-02 DIAGNOSIS — E876 Hypokalemia: Secondary | ICD-10-CM | POA: Diagnosis not present

## 2023-10-02 DIAGNOSIS — E871 Hypo-osmolality and hyponatremia: Secondary | ICD-10-CM | POA: Diagnosis present

## 2023-10-02 LAB — BASIC METABOLIC PANEL
Anion gap: 19 — ABNORMAL HIGH (ref 5–15)
Anion gap: 19 — ABNORMAL HIGH (ref 5–15)
Anion gap: 17 — ABNORMAL HIGH (ref 5–15)
Anion gap: 15 (ref 5–15)
BUN: 145 mg/dL — ABNORMAL HIGH (ref 8–23)
BUN: 138 mg/dL — ABNORMAL HIGH (ref 8–23)
BUN: 132 mg/dL — ABNORMAL HIGH (ref 8–23)
BUN: 122 mg/dL — ABNORMAL HIGH (ref 8–23)
CO2: 22 mmol/L (ref 22–32)
CO2: 20 mmol/L — ABNORMAL LOW (ref 22–32)
CO2: 19 mmol/L — ABNORMAL LOW (ref 22–32)
CO2: 21 mmol/L — ABNORMAL LOW (ref 22–32)
Calcium: 9 mg/dL (ref 8.9–10.3)
Calcium: 8.8 mg/dL — ABNORMAL LOW (ref 8.9–10.3)
Calcium: 8.7 mg/dL — ABNORMAL LOW (ref 8.9–10.3)
Calcium: 8.9 mg/dL (ref 8.9–10.3)
Chloride: 124 mmol/L — ABNORMAL HIGH (ref 98–111)
Chloride: 125 mmol/L — ABNORMAL HIGH (ref 98–111)
Chloride: 125 mmol/L — ABNORMAL HIGH (ref 98–111)
Chloride: 121 mmol/L — ABNORMAL HIGH (ref 98–111)
Creatinine, Ser: 7.55 mg/dL — ABNORMAL HIGH (ref 0.44–1.00)
Creatinine, Ser: 7.35 mg/dL — ABNORMAL HIGH (ref 0.44–1.00)
Creatinine, Ser: 6.9 mg/dL — ABNORMAL HIGH (ref 0.44–1.00)
Creatinine, Ser: 6.36 mg/dL — ABNORMAL HIGH (ref 0.44–1.00)
GFR, Estimated: 5 mL/min — ABNORMAL LOW (ref >60)
GFR, Estimated: 5 mL/min — ABNORMAL LOW (ref >60)
GFR, Estimated: 6 mL/min — ABNORMAL LOW (ref >60)
GFR, Estimated: 6 mL/min — ABNORMAL LOW (ref >60)
Glucose, Bld: 116 mg/dL — ABNORMAL HIGH (ref 70–99)
Glucose, Bld: 160 mg/dL — ABNORMAL HIGH (ref 70–99)
Glucose, Bld: 159 mg/dL — ABNORMAL HIGH (ref 70–99)
Glucose, Bld: 182 mg/dL — ABNORMAL HIGH (ref 70–99)
Potassium: 5 mmol/L (ref 3.5–5.1)
Potassium: 4.3 mmol/L (ref 3.5–5.1)
Potassium: 4.8 mmol/L (ref 3.5–5.1)
Potassium: 3.8 mmol/L (ref 3.5–5.1)
Sodium: 165 mmol/L (ref 135–145)
Sodium: 164 mmol/L (ref 135–145)
Sodium: 161 mmol/L (ref 135–145)
Sodium: 157 mmol/L — ABNORMAL HIGH (ref 135–145)

## 2023-10-02 LAB — CBC
HCT: 35.2 % — ABNORMAL LOW (ref 36.0–46.0)
Hemoglobin: 11.3 g/dL — ABNORMAL LOW (ref 12.0–15.0)
MCH: 29 pg (ref 26.0–34.0)
MCHC: 32.1 g/dL (ref 30.0–36.0)
MCV: 90.5 fL (ref 80.0–100.0)
Platelets: 263 10*3/uL (ref 150–400)
RBC: 3.89 MIL/uL (ref 3.87–5.11)
RDW: 15.8 % — ABNORMAL HIGH (ref 11.5–15.5)
WBC: 19.7 10*3/uL — ABNORMAL HIGH (ref 4.0–10.5)
nRBC: 0 % (ref 0.0–0.2)

## 2023-10-02 LAB — URINE CULTURE

## 2023-10-02 LAB — I-STAT CG4 LACTIC ACID, ED: Lactic Acid, Venous: 1.9 mmol/L (ref 0.5–1.9)

## 2023-10-02 LAB — HEPATIC FUNCTION PANEL
ALT: 11 U/L (ref 0–44)
AST: 19 U/L (ref 15–41)
Albumin: 3 g/dL — ABNORMAL LOW (ref 3.5–5.0)
Alkaline Phosphatase: 89 U/L (ref 38–126)
Bilirubin, Direct: 0.1 mg/dL (ref 0.0–0.2)
Indirect Bilirubin: 0.7 mg/dL (ref 0.3–0.9)
Total Bilirubin: 0.8 mg/dL (ref <1.2)
Total Protein: 8.6 g/dL — ABNORMAL HIGH (ref 6.5–8.1)

## 2023-10-02 MED ORDER — DEXTROSE 5 % IV SOLN: INTRAVENOUS | Status: DC

## 2023-10-02 MED ORDER — SODIUM CHLORIDE 0.9% FLUSH
3.0000 mL | Freq: Two times a day (BID) | INTRAVENOUS | Status: DC
Start: 2023-10-02 — End: 2023-10-04
  Administered 2023-10-02 – 2023-10-04 (×5): 3 mL via INTRAVENOUS

## 2023-10-02 MED ORDER — IPRATROPIUM-ALBUTEROL 0.5-2.5 (3) MG/3ML IN SOLN: 3.0000 mL | RESPIRATORY_TRACT | Status: DC | PRN

## 2023-10-02 MED ORDER — SODIUM CHLORIDE 0.9 % IV SOLN
1.0000 g | INTRAVENOUS | Status: DC
  Administered 2023-10-02: 1 g via INTRAVENOUS
  Filled 2023-10-02: qty 10

## 2023-10-02 MED ORDER — PROCHLORPERAZINE EDISYLATE 10 MG/2ML IJ SOLN: 5.0000 mg | Freq: Four times a day (QID) | INTRAMUSCULAR | Status: DC | PRN

## 2023-10-02 MED ORDER — SODIUM CHLORIDE 0.9 % IV SOLN: INTRAVENOUS | Status: DC

## 2023-10-02 MED ORDER — ACETAMINOPHEN 650 MG RE SUPP: 650.0000 mg | Freq: Four times a day (QID) | RECTAL | Status: DC | PRN

## 2023-10-02 MED ORDER — ALBUTEROL SULFATE (2.5 MG/3ML) 0.083% IN NEBU: 2.5000 mg | INHALATION_SOLUTION | RESPIRATORY_TRACT | Status: DC | PRN

## 2023-10-02 MED ORDER — ACETAMINOPHEN 325 MG PO TABS: 650.0000 mg | ORAL_TABLET | Freq: Four times a day (QID) | ORAL | Status: DC | PRN

## 2023-10-02 MED ORDER — HEPARIN SODIUM (PORCINE) 5000 UNIT/ML IJ SOLN
5000.0000 [IU] | Freq: Three times a day (TID) | INTRAMUSCULAR | Status: DC
Start: 2023-10-02 — End: 2023-10-04
  Administered 2023-10-02 – 2023-10-04 (×6): 5000 [IU] via SUBCUTANEOUS
  Filled 2023-10-02 (×7): qty 1

## 2023-10-02 NOTE — H&P (Addendum)
History and Physical    Betty [name] ZOX:[id number] DOB: 04/29/1946 DOA: 10/01/2023  PCP: Collene Mares, PA   Patient coming from: SNF   Chief Complaint: Decreased responsiveness, abnormal labs   HPI: Betty [name] is a 77 y.o. female with medical history significant for dementia, COPD, CKD stage IV, CAD, chronic HFrEF, and lung cancer, now presenting with altered mental status and abnormal labs.   Patient is typically conversant and able to use a wheelchair but has not been eating or drinking in several days and has been increasingly somnolent with decreased responsiveness.  She was found on the floor of her facility per report of her son.  Labs were collected at her SNF and concerning for hypernatremia and renal failure.   She would not dialysis or any aggressive or invasive investigations or treatments per report of her son.   ED Course: Upon arrival to the ED, patient is found to be afebrile and saturating well on room air with elevated heart rate and stable blood pressure.  Labs are most notable for sodium 165, BUN 152, creatinine 8.03, WBC 14,500, and normal lactic acid.  No acute findings are noted on head CT or cervical spine CT.  No hydronephrosis is seen on CT of the abdomen and pelvis.  Patient was given a liter of LR and 1 g IV Rocephin in the ED.  Review of Systems:  ROS limited by patient's clinical condition.  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)    Arthritis    bursitis in right shoulder (05/21/2018)   Blood transfusion without reported diagnosis    Cerebral aneurysm, nonruptured 07/16/2018   Right anterior communicating artery   CKD (chronic kidney disease), stage IV (HCC)    Colon polyps    COPD (chronic obstructive pulmonary disease) (HCC)    emphysema   Dementia (HCC) 07/16/2018   GERD (gastroesophageal reflux disease)    Hypercholesterolemia    Hypertension    Non-small cell carcinoma  of right lung, stage 1 (HCC) 05/17/2015   Stage IB, right upper lobectomy 04/29/15, chemo    NSTEMI (non-ST elevated myocardial infarction) (HCC) 03/24/2018   Presence of permanent cardiac pacemaker 05/21/2018   Pure hypercholesterolemia    Sciatica of right side    Seasonal allergic rhinitis    Tobacco dependence    Tubular adenoma of colon    Vitamin D deficiency     Past Surgical History:  Procedure Laterality Date   BIOPSY  06/26/2019   Procedure: BIOPSY;  Surgeon: Charlott Rakes, MD;  Location: [facility] ENDOSCOPY;  Service: Endoscopy;;   BIV PACEMAKER INSERTION CRT-P N/A 05/21/2018   Procedure: BIV PACEMAKER INSERTION CRT-P;  Surgeon: Duke Salvia, MD;  Location: [facility] INVASIVE CV LAB;  Service: Cardiovascular;  Laterality: N/A;   BREAST SURGERY     small mass removed from left breast--benign   CRYO INTERCOSTAL NERVE BLOCK Right 04/29/2015   Procedure: CRYO INTERCOSTAL NERVE BLOCK;  Surgeon: Loreli Slot, MD;  Location: [facility] OR;  Service: Thoracic;  Laterality: Right;   ESOPHAGOGASTRODUODENOSCOPY (EGD) WITH PROPOFOL N/A 06/26/2019   Procedure: ESOPHAGOGASTRODUODENOSCOPY (EGD) WITH PROPOFOL;  Surgeon: Charlott Rakes, MD;  Location: [facility] ENDOSCOPY;  Service: Endoscopy;  Laterality: N/A;   INSERT / REPLACE / REMOVE PACEMAKER  05/21/2018   LOBECTOMY Right 04/29/2015   Procedure: RIGHT LOWER LUNG LOBECTOMY ;  Surgeon: Loreli Slot, MD;  Location: [facility] OR;  Service: Thoracic;  Laterality: Right;  LYMPH NODE DISSECTION Right 04/29/2015   Procedure: RIGHT LUNG LYMPH NODE DISSECTION;  Surgeon: Loreli Slot, MD;  Location: [facility] OR;  Service: Thoracic;  Laterality: Right;   RIGHT/LEFT HEART CATH AND CORONARY ANGIOGRAPHY N/A 04/24/2018   Procedure: RIGHT/LEFT HEART CATH AND CORONARY ANGIOGRAPHY;  Surgeon: Runell Gess, MD;  Location: [facility] INVASIVE CV LAB;  Service: Cardiovascular;  Laterality: N/A;   VAGINAL DELIVERY     52 yrs ago   VIDEO ASSISTED THORACOSCOPY (VATS)/ LOBECTOMY  Right 04/29/2015   Procedure: RIGHT VIDEO ASSISTED THORACOSCOPY (VATS) WEDGE RESECTION/ RIGHT LOWER LOBECTOMY, CRYO-ANALGESIA OF INTERCOSTAL NERVES;  Surgeon: Loreli Slot, MD;  Location: [facility] OR;  Service: Thoracic;  Laterality: Right;    Social History:   reports that she quit smoking about 8 years ago. Her smoking use included cigarettes. She started smoking about 55 years ago. She has a 35.3 pack-year smoking history. She has been exposed to tobacco smoke. She has never used smokeless tobacco. She reports current alcohol use. She reports that she does not use drugs.  Allergies  Allergen Reactions   Sulfa Antibiotics Itching    Family History  Problem Relation Age of Onset   Cancer Father    Cancer Sister    Heart attack Brother    Dementia Brother    Other Son        bicuspid aortic valve   Heart attack Brother    Cancer Sister      Prior to Admission medications   Medication Sig Start Date End Date Taking? Authorizing Provider  acetaminophen (TYLENOL) 325 MG tablet Take 650 mg by mouth every 6 (six) hours as needed for mild pain or moderate pain.   Yes [provider]  aspirin EC 81 MG tablet Take 81 mg by mouth at bedtime.   Yes [provider]  atorvastatin (LIPITOR) 40 MG tablet TAKE 1 TABLET BY MOUTH EVERY DAY AT 6 PM 07/12/23  Yes Duke Salvia, MD  cetirizine (ZYRTEC) 10 MG tablet Take 10 mg by mouth daily.   Yes [provider]  cholecalciferol (VITAMIN D3) 25 MCG (1000 UNIT) tablet Take 1,000 Units by mouth at bedtime.   Yes [provider]  donepezil (ARICEPT) 10 MG tablet Take 1 tablet (10 mg total) by mouth at bedtime. 12/14/21  Yes Glean Salvo, NP  febuxostat (ULORIC) 40 MG tablet Take 40 mg by mouth at bedtime. 03/27/23  Yes [provider]  isosorbide dinitrate (ISORDIL) 20 MG tablet TAKE 1 TABLET BY MOUTH THREE TIMES DAILY 08/08/22  Yes Lyn Records, MD  memantine (NAMENDA) 10 MG tablet Take 1 tablet (10 mg  total) by mouth 2 (two) times daily. 06/28/22  Yes Glean Salvo, NP  metoprolol succinate (TOPROL-XL) 50 MG 24 hr tablet Take 1 tablet (50 mg total) by mouth daily. Take with or immediately following a meal. 06/10/23  Yes Pemberton, Kathlynn Grate, MD  mirtazapine (REMERON) 7.5 MG tablet Take 7.5 mg by mouth at bedtime.   Yes [provider]  Multiple Vitamin (MULTIVITAMIN WITH MINERALS) TABS tablet Take 0.5 tablets by mouth at bedtime.   Yes [provider]  nitroGLYCERIN (NITROSTAT) 0.4 MG SL tablet Place 1 tablet (0.4 mg total) under the tongue every 5 (five) minutes x 3 doses as needed for chest pain. 04/26/18  Yes Berton Bon, NP  NUTRITIONAL SUPPLEMENTS PO Take 120 mLs by mouth daily.   Yes [provider]  pantoprazole (PROTONIX) 40 MG tablet Take 40 mg by  mouth every morning. 03/14/23  Yes [provider]  QUEtiapine (SEROQUEL) 25 MG tablet Take 25 mg by mouth at bedtime. 05/28/23  Yes [provider]  sodium bicarbonate 650 MG tablet Take 1 tablet (650 mg total) by mouth 2 (two) times daily. 07/02/19  Yes Marguerita Merles [place], [place]    Physical Exam: Vitals:   10/01/23 2152 10/01/23 2152 10/02/23 0215 10/02/23 0318  BP:    136/88  Pulse:      Resp:   20 19  Temp:    98.3 F (36.8 C)  TempSrc:      SpO2:  97%  98%  Weight: 50.3 kg     Height: 5\' 4"  (1.626 m)       Constitutional: NAD, no pallor or diaphoresis  Eyes: PERTLA, lids and conjunctivae normal ENMT: Mucous membranes are dry. Posterior pharynx clear of any exudate or lesions.   Neck: supple, no masses  Respiratory: no wheezing, no crackles. No accessory muscle use.  Cardiovascular: S1 & S2 heard, regular rate and rhythm. No extremity edema.   Abdomen: Soft, no tenderness. Bowel sounds active.  Musculoskeletal: no clubbing / cyanosis. No joint deformity upper and lower extremities.   Skin: no significant rashes, lesions, ulcers. Poor turgor. Neurologic: Sleeping. Opens eyes briefly to  loud voice, is moving extremities spontaneously, but not answering questions or following commands.   Labs and Imaging on Admission: I have personally reviewed following labs and imaging studies  CBC: Recent Labs  Lab 10/01/23 2247  WBC 14.5*  HGB 13.2  HCT 47.4*  MCV 100.9*  PLT 291   Basic Metabolic Panel: Recent Labs  Lab 10/01/23 2247  NA 165*  K 5.0  CL 127*  CO2 17*  GLUCOSE 132*  BUN 152*  CREATININE 8.03*  CALCIUM 9.2   GFR: Estimated Creatinine Clearance: 4.6 mL/min (A) (by C-G formula based on SCr of 8.03 mg/dL (H)). Liver Function Tests: Recent Labs  Lab 10/02/23 0004  AST 19  ALT 11  ALKPHOS 89  BILITOT 0.8  PROT 8.6*  ALBUMIN 3.0*   No results for input(s): "LIPASE", "AMYLASE" in the last 168 hours. No results for input(s): "AMMONIA" in the last 168 hours. Coagulation Profile: No results for input(s): "INR", "PROTIME" in the last 168 hours. Cardiac Enzymes: No results for input(s): "CKTOTAL", "CKMB", "CKMBINDEX", "TROPONINI" in the last 168 hours. BNP (last 3 results) No results for input(s): "PROBNP" in the last 8760 hours. HbA1C: No results for input(s): "HGBA1C" in the last 72 hours. CBG: No results for input(s): "GLUCAP" in the last 168 hours. Lipid Profile: No results for input(s): "CHOL", "HDL", "LDLCALC", "TRIG", "CHOLHDL", "LDLDIRECT" in the last 72 hours. Thyroid Function Tests: No results for input(s): "TSH", "T4TOTAL", "FREET4", "T3FREE", "THYROIDAB" in the last 72 hours. Anemia Panel: No results for input(s): "VITAMINB12", "FOLATE", "FERRITIN", "TIBC", "IRON", "RETICCTPCT" in the last 72 hours. Urine analysis:    Component Value Date/Time   COLORURINE YELLOW 10/01/2023 2231   APPEARANCEUR TURBID (A) 10/01/2023 2231   LABSPEC 1.013 10/01/2023 2231   PHURINE 5.0 10/01/2023 2231   GLUCOSEU NEGATIVE 10/01/2023 2231   HGBUR MODERATE (A) 10/01/2023 2231   BILIRUBINUR NEGATIVE 10/01/2023 2231   KETONESUR NEGATIVE 10/01/2023 2231    PROTEINUR 100 (A) 10/01/2023 2231   UROBILINOGEN 0.2 04/27/2015 0834   NITRITE NEGATIVE 10/01/2023 2231   LEUKOCYTESUR MODERATE (A) 10/01/2023 2231   Sepsis Labs: @LABRCNTIP (procalcitonin:4,lacticidven:4) )No results found for this or any previous visit (from the past 240 hour(s)).   Radiological Exams  on Admission: CT Head Wo Contrast  Result Date: 10/02/2023 CLINICAL DATA:  Mental status changes, unknown cause, neck trauma. EXAM: CT HEAD WITHOUT CONTRAST CT CERVICAL SPINE WITHOUT CONTRAST TECHNIQUE: Multidetector CT imaging of the head and cervical spine was performed following the standard protocol without intravenous contrast. Multiplanar CT image reconstructions of the cervical spine were also generated. RADIATION DOSE REDUCTION: This exam was performed according to the departmental dose-optimization program which includes automated exposure control, adjustment of the mA and/or kV according to patient size and/or use of iterative reconstruction technique. COMPARISON:  CT scan head and cervical spine both 07/28/2023. FINDINGS: CT HEAD FINDINGS Brain: There is moderately advanced cerebral and mild cerebellar atrophy, moderate atrophic ventriculomegaly and advanced small vessel disease of the cerebral white matter. There are bilateral small chronic gangliocapsular lacunar infarcts. No new infarct is suspected, no hemorrhage, mass or mass effect and no midline shift. Small rounded partially calcified mass again noted in the inferior aspect of the right sylvian fissure measuring 9.2 x 8.1 mm on 7:23, also visible on coronal reconstruction image 46. 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 There is no positive mass effect. Vascular: There are calcific plaques in the carotid siphons and both distal vertebral arteries. No hyperdense central vessel is seen. Skull: Negative for fractures or focal lesions.  Sinuses/Orbits: No acute findings. Old unilateral left lens replacement. 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 Other: None. CT CERVICAL SPINE FINDINGS Alignment: The patient's head is turned to the right. Again seen is a minimal grade 1 anterolisthesis at C2-3 and C4-5, mild reversal of the cervical lordosis, and chronic bone-on-bone anterior atlantodental joint space loss with osteophytes. C1 appears normally positioned on C2 considering the patient has had position. No new or traumatic alignment abnormality is suspected. Skull base and vertebrae: Osteopenia. No acute fracture is evident. No primary bone lesion or focal pathologic process. Soft tissues and spinal canal: No prevertebral fluid or swelling. No visible canal hematoma. The carotid bifurcations are heavily calcified. No laryngeal or thyroid mass. Disc levels: The discs are normal in heights at C2-3, C7-T1, referable collapsed at the former levels in between There are bidirectional endplate spurs most levels, with posterior disc osteophyte complexes deforming the ventral cord surface from C3-4 through C6-7 but without evidence of frank cord compression herniated discs. Facet joint and uncinate osteophytes are noted most levels. There is multilevel acquired foraminal stenosis which is greatest at C6-7 on the left-greater-than-right, and at C5-6 on the right. Upper chest: Lung apices are emphysematous. There is asymmetric coarse scar-like opacity in left apex. Bipolar pacemaker wiring partially visible. Aortic atherosclerosis. Other: None. IMPRESSION: 1. No acute intracranial CT findings or depressed skull fractures. 2. Atrophy, small-vessel disease and chronic lacunar infarcts. 3. 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 Further workup if clinically warranted could  include CTA or MRI/MRA. 4. Osteopenia and degenerative change of the cervical spine without evidence of fractures. 5. Reversal of the cervical lordosis and minimal chronic grade 1 anterolisthesis at C2-3 and C4-5. 6. Aortic and carotid atherosclerosis. 7. Emphysema.  Asymmetric coarse scar-like opacity in the left apex. Aortic Atherosclerosis (ICD10-I70.0) and Emphysema (ICD10-J43.9). Electronically Signed   By: Almira Bar M.D.   On: 10/02/2023 03:19   CT  Cervical Spine Wo Contrast  Result Date: 10/02/2023 CLINICAL DATA:  Mental status changes, unknown cause, neck trauma. EXAM: CT HEAD WITHOUT CONTRAST CT CERVICAL SPINE WITHOUT CONTRAST TECHNIQUE: Multidetector CT imaging of the head and cervical spine was performed following the standard protocol without intravenous contrast. Multiplanar CT image reconstructions of the cervical spine were also generated. RADIATION DOSE REDUCTION: This exam was performed according to the departmental dose-optimization program which includes automated exposure control, adjustment of the mA and/or kV according to patient size and/or use of iterative reconstruction technique. COMPARISON:  CT scan head and cervical spine both 07/28/2023. FINDINGS: CT HEAD FINDINGS Brain: There is moderately advanced cerebral and mild cerebellar atrophy, moderate atrophic ventriculomegaly and advanced small vessel disease of the cerebral white matter. There are bilateral small chronic gangliocapsular lacunar infarcts. No new infarct is suspected, no hemorrhage, mass or mass effect and no midline shift. Small rounded partially calcified mass again noted in the inferior aspect of the right sylvian fissure measuring 9.2 x 8.1 mm on 7:23, also visible on coronal reconstruction image 46. 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 There is no positive mass effect. Vascular: There are calcific plaques  in the carotid siphons and both distal vertebral arteries. No hyperdense central vessel is seen. Skull: Negative for fractures or focal lesions. Sinuses/Orbits: No acute findings. Old unilateral left lens replacement. 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 Other: None. CT CERVICAL SPINE FINDINGS Alignment: The patient's head is turned to the right. Again seen is a minimal grade 1 anterolisthesis at C2-3 and C4-5, mild reversal of the cervical lordosis, and chronic bone-on-bone anterior atlantodental joint space loss with osteophytes. C1 appears normally positioned on C2 considering the patient has had position. No new or traumatic alignment abnormality is suspected. Skull base and vertebrae: Osteopenia. No acute fracture is evident. No primary bone lesion or focal pathologic process. Soft tissues and spinal canal: No prevertebral fluid or swelling. No visible canal hematoma. The carotid bifurcations are heavily calcified. No laryngeal or thyroid mass. Disc levels: The discs are normal in heights at C2-3, C7-T1, referable collapsed at the former levels in between There are bidirectional endplate spurs most levels, with posterior disc osteophyte complexes deforming the ventral cord surface from C3-4 through C6-7 but without evidence of frank cord compression herniated discs. Facet joint and uncinate osteophytes are noted most levels. There is multilevel acquired foraminal stenosis which is greatest at C6-7 on the left-greater-than-right, and at C5-6 on the right. Upper chest: Lung apices are emphysematous. There is asymmetric coarse scar-like opacity in left apex. Bipolar pacemaker wiring partially visible. Aortic atherosclerosis. Other: None. IMPRESSION: 1. No acute intracranial CT findings or depressed skull fractures. 2. Atrophy, small-vessel disease and chronic lacunar infarcts. 3. 9.2 x 8.1 mm partially calcified mass in the inferior aspect of the right Sylvian fissure, unchanged back to head CT of  03/30/2021 and most likely a right MCA bifurcation aneurysm or a partially calcified meningioma. Further workup if clinically warranted could include CTA or MRI/MRA. 4. Osteopenia and degenerative change of the cervical spine without evidence of fractures. 5. Reversal of the cervical lordosis and minimal chronic grade 1 anterolisthesis at C2-3 and C4-5. 6. Aortic and carotid atherosclerosis. 7. Emphysema.  Asymmetric coarse scar-like opacity in the left apex. Aortic Atherosclerosis (ICD10-I70.0) and Emphysema (ICD10-J43.9). Electronically Signed   By: Almira Bar M.D.   On: 10/02/2023 03:19   CT Renal Stone  Study  Result Date: 10/02/2023 CLINICAL DATA:  Abdominal pain. EXAM: CT ABDOMEN AND PELVIS WITHOUT CONTRAST TECHNIQUE: Multidetector CT imaging of the abdomen and pelvis was performed following the standard protocol without IV contrast. RADIATION DOSE REDUCTION: This exam was performed according to the departmental dose-optimization program which includes automated exposure control, adjustment of the mA and/or kV according to patient size and/or use of iterative reconstruction technique. COMPARISON:  CT chest abdomen and pelvis 06/25/2023 FINDINGS: Lower chest: No acute abnormality. Hepatobiliary: The gallbladder is distended. Gallstones are present. There is no biliary ductal dilatation. No focal liver lesions are seen. Pancreas: Unremarkable. No pancreatic ductal dilatation or surrounding inflammatory changes. Spleen: Normal in size without focal abnormality. Adrenals/Urinary Tract: Bilateral renal cysts are again seen. The largest is in the right kidney measuring up to 2.1 cm. There are no urinary tract calculi. There is no hydronephrosis. The adrenal glands and bladder are within normal limits. Stomach/Bowel: The proximal appendix appears within normal limits. The distal appendix is not well delineated in the pelvis. No focal inflammation, bowel obstruction or free air identified. There is sigmoid  colon diverticulosis. Vascular/Lymphatic: Aortic atherosclerosis. No enlarged abdominal or pelvic lymph nodes. Reproductive: Coarse rounded calcifications are seen in the pelvis which likely represent fibroids measuring up to 2.7 cm. The ovaries are not well delineated. Other: No significant free fluid or free air. No abdominal wall hernia. Small amount of subcutaneous air in the anterior lower right abdominal wall likely represents medication injection site. There some mild subcutaneous stranding in the right inguinal region and along the right lateral body wall. Musculoskeletal: Degenerative changes affect the hips and spine. IMPRESSION: 1. Distended gallbladder with gallstones. Correlate clinically for cholecystitis. 2. Colonic diverticulosis. 3. Uterine fibroids. 4. Mild subcutaneous stranding in the right inguinal region and along the right lateral body wall. Correlate clinically for cellulitis. 5. Right Bosniak I benign renal cyst measuring 2.1 cm. No follow-up imaging is recommended. JACR 2018 Feb; 264-273, Management of the Incidental Renal Mass on CT, [employer] 2021; [id number], Bosniak Classification of Cystic Renal Masses, Version 2019. Aortic Atherosclerosis (ICD10-I70.0). Electronically Signed   By: Darliss Cheney M.D.   On: 10/02/2023 01:58   DG Chest Port 1 View  Result Date: 10/01/2023 CLINICAL DATA:  Weakness EXAM: PORTABLE CHEST 1 VIEW COMPARISON:  07/28/2023 FINDINGS: Stable cardiomediastinal silhouette. Aortic atherosclerotic calcification. Left chest wall CRT-P. Similar interstitial opacities in the left mid and lower lung. No pleural effusion or pneumothorax. IMPRESSION: Similar interstitial opacities in the left mid and lower lung which may be chronic or due to atypical infection or aspiration. Electronically Signed   By: Minerva Fester M.D.   On: 10/01/2023 23:56    EKG: Independently reviewed. Paced.   Assessment/Plan   1. Acute renal failure superimposed on CKD IV; hypernatremia   - Serum sodium is 165, BUN 152, and SCr 8.03 (BUN 47 & SCr 3.15 in September '24) in the setting of not eating or drinking for several days  - No hydronephrosis on CT in ED  - She was fluid-resuscitated with isotonic IVF in ED  - Start D5W infusion, renally-dose medications, follow serial chem panels    2. Acute encephalopathy  - Decreased responsiveness with no acute findings on head CT  - Likely d/t metabolic derangements and UTI with underlying dementia  - Treat UTI, correct metabolic derangements, use delirium precautions    3. UTI  - Continue Rocephin, follow cultures    4. Chronic HFrEF  - EF was 30-35% in January 2024  -  Appears hypovolemic on admission  - Monitor weight and I/Os    5. COPD  - Not in exacerbation  - DuoNeb as needed    6. Lung cancer  - S/p right upper lobectomy and chemo in 2016  - New lung nodules noted during recent admission and follow-up with oncology was advised should family choose to purse that    DVT prophylaxis: sq heparin  Code Status: DNR Level of Care: Level of care: Telemetry Medical Family Communication: Son updated by phone  Disposition Plan:  Patient is from: SNF  Anticipated d/c is to: TBD Anticipated d/c date is: 10/06/23  Patient currently: Pending improved electrolytes, renal function, and mental status Consults called: None  Admission status: Inpatient     Briscoe Deutscher, MD [facility]  10/02/2023, 4:14 AM

## 2023-10-02 NOTE — Consult Note (Signed)
Palliative Care Consult Note                                  Date: 10/02/2023   Patient Name: Betty [name]  DOB: 13-Jan-1946  MRN: [id number]  Age / Sex: 77 y.o., female  PCP: Collene Mares, [place] Referring Physician: Almon Hercules, MD  Reason for Consultation: Establishing goals of care  HPI/Patient Profile: 77 y.o. female  with past medical history of dementia, COPD, HFrEF, CKD stage IV, CAD, and lung cancer who [facility] the ED from [facility] on [facility]/2023 with altered mental status and abnormal labs including severe and hyponatremia and AKI.  No acute findings noted on head CT or cervical spine CT.  No hydronephrosis is seen on CT of the abdomen and pelvis.  Patient is admitted with acute renal failure superimposed on CKD 4, hypernatremia likely due to dehydration and poor p.o. intake, and probable UTI.  Palliative Medicine is consulted for goals of care.   Subjective:   I have reviewed medical records including EPIC notes, labs and imaging, received an update from the RN, and assessed the patient at bedside.  She is somnolent, nonverbal, and does not follow commands.  I spoke with patient's son by phone to discuss diagnosis, prognosis, GOC, disposition, and options.  Patient is well-known to this NP from her previous hospitalization in August.  I re-introduced Palliative Medicine as specialized medical care for people living with serious illness. It focuses on providing relief from the symptoms and stress of a serious illness.   Created space and opportunity for son to express thoughts and feelings regarding current medical situation.Values and goals of care were attempted to be elicited.  Today's Discussion: We discussed patient's current illness and what it means in the larger context of her ongoing co-morbidities.  We reviewed patient's multiple chronic issues including heart failure with reduced EF, CKD stage IV, COPD, and advanced  dementia.    Reviewed current clinical status of hyponatremia and AKI, likely due to dehydration and poor oral intake.  Reviewed patient's rapidly declining functional status over the past few months.    I shared my concern that patient is failing to thrive and seems to be approaching EOL. Natural trajectory at EOL was discussed.   Reviewed the concept of comfort care, as de-escalating full scope medical interventions and allowing a natural course to occur. Discussed that the goal is comfort and dignity rather than prolonging life. Discussed transitioning to comfort care and what that would look like--keeping her clean and dry, no labs, no artificial hydration or feeding, no antibiotics, minimizing of medications, comfort feeds,  and medication for pain and other symptoms as needed.   [name] agrees with comfort care, as this would be consistent with patient's previously expressed wishes. He speaks to the importance of quality of life and dignity.    However, he does not want to make that transition today (the day before Thanksgiving). He shares that the family is grieving the loss of patient's brother who passed away very recently (funeral was a few days ago).  He wants to wait until after the holiday to transition to comfort care.   Review of Systems  Unable to perform ROS   Objective:   Primary Diagnoses: Present on Admission:  Acute renal failure superimposed on stage 4 chronic kidney disease (HCC)  Hypernatremia  UTI (urinary tract infection)  Non-small cell carcinoma of right lung,  stage 1 (HCC)  Multi-infarct dementia, uncomplicated (HCC)  Essential hypertension  Chronic systolic heart failure (HCC)  Acute encephalopathy  Coronary artery disease  COPD (chronic obstructive pulmonary disease) (HCC)   Physical Exam Vitals reviewed.  Constitutional:      General: She is not in acute distress.    Appearance: She is ill-appearing.  Pulmonary:     Effort: No respiratory  distress.  Neurological:     Mental Status: She is lethargic.     Motor: Weakness present.     Comments: Does not follow commands  Psychiatric:        Speech: She is noncommunicative.        Cognition and Memory: Cognition is impaired.     Palliative Assessment/Data: PPS 10-20%     Assessment & Plan:   SUMMARY OF RECOMMENDATIONS   Continue current supportive care No escalation of care Son is agreeable to comfort care but wants to delay this transition until after the holiday PMT will continue to follow  Primary Decision Maker: NEXT OF KIN - son [name]  Code Status/Advance Care Planning: DNR/DNI  Prognosis:  < 2 weeks  Discharge Planning:  To Be Determined   Discussed with: Dr. Alanda Slim   Thank you for allowing [place] to participate in the care of Betty [name]   Time Total: 80 minutes  Detailed review of medical records (labs, imaging, vital signs), medically appropriate exam, discussed with treatment team, counseling and education to patient, family, & staff, documenting clinical information, medication management, coordination of care.   Signed by: Sherlean Foot, NP Palliative Medicine Team  Team Phone # [phone]  For individual providers, please see [facility]

## 2023-10-02 NOTE — Progress Notes (Signed)
PROGRESS NOTE  Betty [name] WUJ:[id number] DOB: [birth year]   PCP: Collene Mares, PA  Patient is from: SNF  DOA: 10/01/2023 LOS: 0  Chief complaints Chief Complaint  Patient presents with   Altered Mental Status     Brief Narrative / Interim history: 77 year old F with PMH of dementia, COPD, CKD-4, CAD, HFrEF and lung cancer sent to ED from SNF with altered mental status and abnormal labs including severe hyponatremia and AKI.  Reportedly has not been eating or drinking in several days, and found on the floor of a facility per report of his son.  In ED, slightly tachycardic but normotensive.  Afebrile.  Labs are most notable for sodium 165, BUN 152, creatinine 8.03, WBC 14,500, and normal lactic acid.  No acute findings are noted on head CT or cervical spine CT.  No hydronephrosis is seen on CT of the abdomen and pelvis.  Received 1 L of LR bolus and started on IV ceftriaxone, and admitted.  Subjective: Seen and examined earlier this morning.  No major events overnight of this morning.  She is somewhat somnolent.  Barely follows command.  No apparent distress.  No focal neurodeficit but limited exam.  Resisting eye exam.  Sodium remains elevated.  Objective: Vitals:   10/02/23 0215 10/02/23 0318 10/02/23 0500 10/02/23 0759  BP:  136/88  135/77  Pulse:    98  Resp: 20 19  17   Temp:  98.3 F (36.8 C)  97.6 F (36.4 C)  TempSrc:    Oral  SpO2:  98%  100%  Weight:   45.1 kg   Height:        Examination:  GENERAL: Appears frail.  No apparent distress.  Nontoxic. HEENT: MMM.  Vision and hearing grossly intact.  NECK: Supple.  No apparent JVD.  RESP:  No IWOB.  Fair aeration bilaterally. CVS:  RRR. Heart sounds normal.  ABD/GI/GU: BS+. Abd soft, NTND.  MSK/EXT:  Moves extremities. No apparent deformity. No edema.  SKIN: no apparent skin lesion or wound NEURO: Sleepy.  Wakes to voice but does not hold conversation.  Barely follows commands.  No apparent focal neuro deficit  but limited exam. PSYCH: Calm. Normal affect.   Procedures:  None  Microbiology summarized: Blood cultures NGTD Urine culture pending  Assessment and plan: Acute renal failure superimposed on CKD IV/uremia: Baseline Cr 3.0-3.1.  Likely prerenal from poor p.o. intake in the setting of severe dementia.  No hydronephrosis or obstructive etiology on CT.  Received LR bolus in ED.  AKI improving with IV fluid.  Per discussion between admitting provider and son, no dialysis, aggressive for invasive investigation.  Recent Labs    06/26/23 1605 06/29/23 1058 07/04/23 0543 07/05/23 0609 07/06/23 0603 07/28/23 1743 07/29/23 1108 10/01/23 2247 10/02/23 0445 10/02/23 1006  BUN 38* 35* 58* 56* 49* 56* 47* 152* 145* 138*  CREATININE 3.84* 3.81* 4.26* 4.09* 3.78* 3.07* 3.15* 8.03* 7.55* 7.35*  -Increase D5-1 125 cc an hour -Continue monitoring  Hypernatremia: Likely due to dehydration/poor p.o. intake. Recent Labs  Lab 10/01/23 2247 10/02/23 0445 10/02/23 1006  NA 165* 165* 164*  -Increase D5 infusion from 100 to 125 cc an hour -Monitor BMP every 6 hours  Urinary tract infection: Based on urinalysis, leukocytosis and encephalopathy.  No fever.  Blood cultures NGTD -Continue ceftriaxone pending urine culture   Acute metabolic encephalopathy-multifactorial including uremia, hypernatremia, UTI and dehydration.  Still somewhat somnolent.  No focal neurodeficit.  CT head and cervical spine without acute  finding. -Treat treatable causes -Reorientation and delirium precaution -Minimize or avoid sedating medications  Chronic HFrEF: TTE in 11/2022 with LVEF of 30 to 35%.  Dehydrated -IV fluid as above -Monitor fluid and respiratory status while on IV fluid  Chronic COPD: Stable -Nebulizers as needed   History of lung cancer: S/p right upper lobectomy and chemo in 2016.  New lung nodules noted during recent admission and follow-up with oncology was advised should family choose to purse  that   Failure to thrive: Patient with severe dementia complicated by advanced disease process as above.  Poor long-term prognosis.  Appropriately DNR/DNI. -Consult palliative care for further goals of care discussion Body mass index is 17.07 kg/m.           DVT prophylaxis:  heparin injection 5,000 Units Start: 10/02/23 0600  Code Status: DNR/DNI Family Communication: None at bedside Level of care: Telemetry Medical Status is: Inpatient Remains inpatient appropriate because: AKI/uremia, hypernatremia, UTI, dehydration and failure to thrive   Final disposition: Likely back to SNF Consultants:  Palliative medicine  55 minutes with more than 50% spent in reviewing records, counseling patient/family and coordinating care.   Sch Meds:  Scheduled Meds:  heparin  5,000 Units Subcutaneous Q8H   sodium chloride flush  3 mL Intravenous Q12H   Continuous Infusions:  cefTRIAXone (ROCEPHIN)  IV     dextrose 125 mL/hr at 10/02/23 1217   PRN Meds:.acetaminophen **OR** acetaminophen, ipratropium-albuterol, prochlorperazine  Antimicrobials: Anti-infectives (From admission, onward)    Start     Dose/Rate Route Frequency Ordered Stop   10/02/23 2300  cefTRIAXone (ROCEPHIN) 1 g in sodium chloride 0.9 % 100 mL IVPB        1 g 200 mL/hr over 30 Minutes Intravenous Every 24 hours 10/02/23 0412     10/01/23 2230  cefTRIAXone (ROCEPHIN) 1 g in sodium chloride 0.9 % 100 mL IVPB        1 g 200 mL/hr over 30 Minutes Intravenous  Once 10/01/23 2222 10/02/23 0213        I have personally reviewed the following labs and images: CBC: Recent Labs  Lab 10/01/23 2247 10/02/23 0731  WBC 14.5* 19.7*  HGB 13.2 11.3*  HCT 47.4* 35.2*  MCV 100.9* 90.5  PLT 291 263   BMP &GFR Recent Labs  Lab 10/01/23 2247 10/02/23 0445 10/02/23 1006  NA 165* 165* 164*  K 5.0 5.0 4.3  CL 127* 124* 125*  CO2 17* 22 20*  GLUCOSE 132* 116* 160*  BUN 152* 145* 138*  CREATININE 8.03* 7.55* 7.35*   CALCIUM 9.2 9.0 8.8*   Estimated Creatinine Clearance: 4.5 mL/min (A) (by C-G formula based on SCr of 7.35 mg/dL (H)). Liver & Pancreas: Recent Labs  Lab 10/02/23 0004  AST 19  ALT 11  ALKPHOS 89  BILITOT 0.8  PROT 8.6*  ALBUMIN 3.0*   No results for input(s): "LIPASE", "AMYLASE" in the last 168 hours. No results for input(s): "AMMONIA" in the last 168 hours. Diabetic: No results for input(s): "HGBA1C" in the last 72 hours. No results for input(s): "GLUCAP" in the last 168 hours. Cardiac Enzymes: No results for input(s): "CKTOTAL", "CKMB", "CKMBINDEX", "TROPONINI" in the last 168 hours. No results for input(s): "PROBNP" in the last 8760 hours. Coagulation Profile: No results for input(s): "INR", "PROTIME" in the last 168 hours. Thyroid Function Tests: No results for input(s): "TSH", "T4TOTAL", "FREET4", "T3FREE", "THYROIDAB" in the last 72 hours. Lipid Profile: No results for input(s): "CHOL", "HDL", "LDLCALC", "TRIG", "CHOLHDL", "LDLDIRECT"  in the last 72 hours. Anemia Panel: No results for input(s): "VITAMINB12", "FOLATE", "FERRITIN", "TIBC", "IRON", "RETICCTPCT" in the last 72 hours. Urine analysis:    Component Value Date/Time   COLORURINE YELLOW 10/01/2023 2231   APPEARANCEUR TURBID (A) 10/01/2023 2231   LABSPEC 1.013 10/01/2023 2231   PHURINE 5.0 10/01/2023 2231   GLUCOSEU NEGATIVE 10/01/2023 2231   HGBUR MODERATE (A) 10/01/2023 2231   BILIRUBINUR NEGATIVE 10/01/2023 2231   KETONESUR NEGATIVE 10/01/2023 2231   PROTEINUR 100 (A) 10/01/2023 2231   UROBILINOGEN 0.2 04/27/2015 0834   NITRITE NEGATIVE 10/01/2023 2231   LEUKOCYTESUR MODERATE (A) 10/01/2023 2231   Sepsis Labs: Invalid input(s): "PROCALCITONIN", "LACTICIDVEN"  Microbiology: Recent Results (from the past 240 hour(s))  Blood culture (routine x 2)     Status: None (Preliminary result)   Collection Time: 10/01/23 11:21 PM   Specimen: BLOOD RIGHT HAND  Result Value Ref Range Status   Specimen  Description BLOOD RIGHT HAND  Final   Special Requests   Final    BOTTLES DRAWN AEROBIC AND ANAEROBIC Blood Culture results may not be optimal due to an inadequate volume of blood received in culture bottles   Culture   Final    NO GROWTH < 12 HOURS Performed at [facility], [address], [place], [place] [address]    Report Status PENDING  Incomplete  Blood culture (routine x 2)     Status: None (Preliminary result)   Collection Time: 10/02/23 12:02 AM   Specimen: BLOOD RIGHT HAND  Result Value Ref Range Status   Specimen Description BLOOD RIGHT HAND  Final   Special Requests   Final    BOTTLES DRAWN AEROBIC AND ANAEROBIC Blood Culture adequate volume   Culture   Final    NO GROWTH < 12 HOURS Performed at [facility], [address], [place], [place] [address]    Report Status PENDING  Incomplete    Radiology Studies: CT Head Wo Contrast  Result Date: 10/02/2023 CLINICAL DATA:  Mental status changes, unknown cause, neck trauma. EXAM: CT HEAD WITHOUT CONTRAST CT CERVICAL SPINE WITHOUT CONTRAST TECHNIQUE: Multidetector CT imaging of the head and cervical spine was performed following the standard protocol without intravenous contrast. Multiplanar CT image reconstructions of the cervical spine were also generated. RADIATION DOSE REDUCTION: This exam was performed according to the departmental dose-optimization program which includes automated exposure control, adjustment of the mA and/or kV according to patient size and/or use of iterative reconstruction technique. COMPARISON:  CT scan head and cervical spine both 07/28/2023. FINDINGS: CT HEAD FINDINGS Brain: There is moderately advanced cerebral and mild cerebellar atrophy, moderate atrophic ventriculomegaly and advanced small vessel disease of the cerebral white matter. There are bilateral small chronic gangliocapsular lacunar infarcts. No new infarct is suspected, no hemorrhage, mass or mass effect and no midline shift.  Small rounded partially calcified mass again noted in the inferior aspect of the right sylvian fissure measuring 9.2 x 8.1 mm on 7:23, also visible on coronal reconstruction image 46. 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 There is no positive mass effect. Vascular: There are calcific plaques in the carotid siphons and both distal vertebral arteries. No hyperdense central vessel is seen. Skull: Negative for fractures or focal lesions. Sinuses/Orbits: No acute findings. Old unilateral left lens replacement. 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 Other: None. CT CERVICAL SPINE FINDINGS Alignment: The patient's head  is turned to the right. Again seen is a minimal grade 1 anterolisthesis at C2-3 and C4-5, mild reversal of the cervical lordosis, and chronic bone-on-bone anterior atlantodental joint space loss with osteophytes. C1 appears normally positioned on C2 considering the patient has had position. No new or traumatic alignment abnormality is suspected. Skull base and vertebrae: Osteopenia. No acute fracture is evident. No primary bone lesion or focal pathologic process. Soft tissues and spinal canal: No prevertebral fluid or swelling. No visible canal hematoma. The carotid bifurcations are heavily calcified. No laryngeal or thyroid mass. Disc levels: The discs are normal in heights at C2-3, C7-T1, referable collapsed at the former levels in between There are bidirectional endplate spurs most levels, with posterior disc osteophyte complexes deforming the ventral cord surface from C3-4 through C6-7 but without evidence of frank cord compression herniated discs. Facet joint and uncinate osteophytes are noted most levels. There is multilevel acquired foraminal stenosis which is greatest at C6-7 on the left-greater-than-right, and at C5-6 on the right. Upper chest: Lung apices are emphysematous. There  is asymmetric coarse scar-like opacity in left apex. Bipolar pacemaker wiring partially visible. Aortic atherosclerosis. Other: None. IMPRESSION: 1. No acute intracranial CT findings or depressed skull fractures. 2. Atrophy, small-vessel disease and chronic lacunar infarcts. 3. 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 Further workup if clinically warranted could include CTA or MRI/MRA. 4. Osteopenia and degenerative change of the cervical spine without evidence of fractures. 5. Reversal of the cervical lordosis and minimal chronic grade 1 anterolisthesis at C2-3 and C4-5. 6. Aortic and carotid atherosclerosis. 7. Emphysema.  Asymmetric coarse scar-like opacity in the left apex. Aortic Atherosclerosis (ICD10-I70.0) and Emphysema (ICD10-J43.9). Electronically Signed   By: Almira Bar M.D.   On: 10/02/2023 03:19   CT Cervical Spine Wo Contrast  Result Date: 10/02/2023 CLINICAL DATA:  Mental status changes, unknown cause, neck trauma. EXAM: CT HEAD WITHOUT CONTRAST CT CERVICAL SPINE WITHOUT CONTRAST TECHNIQUE: Multidetector CT imaging of the head and cervical spine was performed following the standard protocol without intravenous contrast. Multiplanar CT image reconstructions of the cervical spine were also generated. RADIATION DOSE REDUCTION: This exam was performed according to the departmental dose-optimization program which includes automated exposure control, adjustment of the mA and/or kV according to patient size and/or use of iterative reconstruction technique. COMPARISON:  CT scan head and cervical spine both 07/28/2023. FINDINGS: CT HEAD FINDINGS Brain: There is moderately advanced cerebral and mild cerebellar atrophy, moderate atrophic ventriculomegaly and advanced small vessel disease of the cerebral white matter. There are  bilateral small chronic gangliocapsular lacunar infarcts. No new infarct is suspected, no hemorrhage, mass or mass effect and no midline shift. Small rounded partially calcified mass again noted in the inferior aspect of the right sylvian fissure measuring 9.2 x 8.1 mm on 7:23, also visible on coronal reconstruction image 46. 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 There is no positive mass effect. Vascular: There are calcific plaques in the carotid siphons and both distal vertebral arteries. No hyperdense central vessel is seen. Skull: Negative for fractures or focal lesions. Sinuses/Orbits: No acute findings. Old unilateral left lens replacement. 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 Other: None. CT CERVICAL SPINE FINDINGS Alignment: The patient's head is turned  to the right. Again seen is a minimal grade 1 anterolisthesis at C2-3 and C4-5, mild reversal of the cervical lordosis, and chronic bone-on-bone anterior atlantodental joint space loss with osteophytes. C1 appears normally positioned on C2 considering the patient has had position. No new or traumatic alignment abnormality is suspected. Skull base and vertebrae: Osteopenia. No acute fracture is evident. No primary bone lesion or focal pathologic process. Soft tissues and spinal canal: No prevertebral fluid or swelling. No visible canal hematoma. The carotid bifurcations are heavily calcified. No laryngeal or thyroid mass. Disc levels: The discs are normal in heights at C2-3, C7-T1, referable collapsed at the former levels in between There are bidirectional endplate spurs most levels, with posterior disc osteophyte complexes deforming the ventral cord surface from C3-4 through C6-7 but without evidence of frank cord compression herniated discs. Facet joint and uncinate osteophytes are noted most levels. There is multilevel acquired foraminal  stenosis which is greatest at C6-7 on the left-greater-than-right, and at C5-6 on the right. Upper chest: Lung apices are emphysematous. There is asymmetric coarse scar-like opacity in left apex. Bipolar pacemaker wiring partially visible. Aortic atherosclerosis. Other: None. IMPRESSION: 1. No acute intracranial CT findings or depressed skull fractures. 2. Atrophy, small-vessel disease and chronic lacunar infarcts. 3. 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 Further workup if clinically warranted could include CTA or MRI/MRA. 4. Osteopenia and degenerative change of the cervical spine without evidence of fractures. 5. Reversal of the cervical lordosis and minimal chronic grade 1 anterolisthesis at C2-3 and C4-5. 6. Aortic and carotid atherosclerosis. 7. Emphysema.  Asymmetric coarse scar-like opacity in the left apex. Aortic Atherosclerosis (ICD10-I70.0) and Emphysema (ICD10-J43.9). Electronically Signed   By: Almira Bar M.D.   On: 10/02/2023 03:19   CT Renal Stone Study  Result Date: 10/02/2023 CLINICAL DATA:  Abdominal pain. EXAM: CT ABDOMEN AND PELVIS WITHOUT CONTRAST TECHNIQUE: Multidetector CT imaging of the abdomen and pelvis was performed following the standard protocol without IV contrast. RADIATION DOSE REDUCTION: This exam was performed according to the departmental dose-optimization program which includes automated exposure control, adjustment of the mA and/or kV according to patient size and/or use of iterative reconstruction technique. COMPARISON:  CT chest abdomen and pelvis 06/25/2023 FINDINGS: Lower chest: No acute abnormality. Hepatobiliary: The gallbladder is distended. Gallstones are present. There is no biliary ductal dilatation. No focal liver lesions are seen. Pancreas: Unremarkable. No pancreatic ductal  dilatation or surrounding inflammatory changes. Spleen: Normal in size without focal abnormality. Adrenals/Urinary Tract: Bilateral renal cysts are again seen. The largest is in the right kidney measuring up to 2.1 cm. There are no urinary tract calculi. There is no hydronephrosis. The adrenal glands and bladder are within normal limits. Stomach/Bowel: The proximal appendix appears within normal limits. The distal appendix is not well delineated in the pelvis. No focal inflammation, bowel obstruction or free air identified. There is sigmoid colon diverticulosis. Vascular/Lymphatic: Aortic atherosclerosis. No enlarged abdominal or pelvic lymph nodes. Reproductive: Coarse rounded calcifications are seen in the pelvis which likely represent fibroids measuring up to 2.7 cm. The ovaries are not well delineated. Other: No significant free fluid or free air. No abdominal wall hernia. Small amount of subcutaneous air in the anterior lower right abdominal wall likely represents medication injection site. There some mild subcutaneous stranding in the right inguinal region and along  the right lateral body wall. Musculoskeletal: Degenerative changes affect the hips and spine. IMPRESSION: 1. Distended gallbladder with gallstones. Correlate clinically for cholecystitis. 2. Colonic diverticulosis. 3. Uterine fibroids. 4. Mild subcutaneous stranding in the right inguinal region and along the right lateral body wall. Correlate clinically for cellulitis. 5. Right Bosniak I benign renal cyst measuring 2.1 cm. No follow-up imaging is recommended. JACR 2018 Feb; [id number], Management of the Incidental Renal Mass on CT, [employer] 2021; [id number], Bosniak Classification of Cystic Renal Masses, Version 2019. Aortic Atherosclerosis (ICD10-I70.0). Electronically Signed   By: Darliss Cheney M.D.   On: 10/02/2023 01:58   DG Chest Port 1 View  Result Date: 10/01/2023 CLINICAL DATA:  Weakness EXAM: PORTABLE CHEST 1 VIEW COMPARISON:   07/28/2023 FINDINGS: Stable cardiomediastinal silhouette. Aortic atherosclerotic calcification. Left chest wall CRT-P. Similar interstitial opacities in the left mid and lower lung. No pleural effusion or pneumothorax. IMPRESSION: Similar interstitial opacities in the left mid and lower lung which may be chronic or due to atypical infection or aspiration. Electronically Signed   By: Minerva Fester M.D.   On: 10/01/2023 23:56      Josep Luviano T. Elka Satterfield [facility]  If 7PM-7AM, please contact night-coverage [email or web address] 10/02/2023, 1:49 PM

## 2023-10-02 NOTE — ED Notes (Signed)
ED TO INPATIENT HANDOFF REPORT  ED Nurse Name and Phone #: Norlene Duel [phone]  S Name/Age/Gender Betty [name] 77 y.o. female Room/Bed: 034C/034C  Code Status   Code Status: Prior  Home/SNF/Other Skilled nursing facility - [facility] Patient oriented to: self Is this baseline? Yes  Patient responds to pain - says some words. Family not helpful with orientation baseline.   Triage Complete: Triage complete  Chief Complaint Acute renal failure superimposed on stage 4 chronic kidney disease (HCC) [N17.9, N18.4]  Triage Note Pt to ED via [facility] from [facility] nursing home. Facility called EMS for AMS. Pt has dementia baseline but has been more altered than normal. Staff reports pt is usually able to mobilize with wheelchair and is verbal at baseline. Staff reports pt being more lethargic and less talkative today. Unsure of exact LKW. Pt also sent over for abnormal labs (elevated WBC and elevated sodium and potential UTI). Pt has pacemaker.   EMS: 118/100 120 HR 20 cap 30 RR 98% RA  22 L Hand   Allergies Allergies  Allergen Reactions   Sulfa Antibiotics Itching    Level of Care/Admitting Diagnosis ED Disposition     ED Disposition  Admit   Condition  --   Comment  Hospital Area: [facility] [[id number]]  Level of Care: Telemetry Medical [[id number]]  May admit patient to [facility] or [facility] if equivalent level of care is available:: Yes  Covid Evaluation: Asymptomatic - no recent exposure (last 10 days) testing not required  Diagnosis: Acute renal failure superimposed on stage 4 chronic kidney disease [facility]) [[id number]]  Admitting Physician: Briscoe Deutscher [[id number]]  Attending Physician: Briscoe Deutscher [[id number]]  Certification:: I certify this patient will need inpatient services for at least 2 midnights  Expected Medical Readiness: 10/05/2023          B Medical/Surgery History Past Medical History:  Diagnosis Date   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)    Arthritis    bursitis in right shoulder (05/21/2018)   Blood transfusion without reported diagnosis    Cerebral aneurysm, nonruptured 07/16/2018   Right anterior communicating artery   CKD (chronic kidney disease), stage IV (HCC)    Colon polyps    COPD (chronic obstructive pulmonary disease) (HCC)    emphysema   Dementia (HCC) 07/16/2018   GERD (gastroesophageal reflux disease)    Hypercholesterolemia    Hypertension    Non-small cell carcinoma of right lung, stage 1 (HCC) 05/17/2015   Stage IB, right upper lobectomy 04/29/15, chemo    NSTEMI (non-ST elevated myocardial infarction) (HCC) 03/24/2018   Presence of permanent cardiac pacemaker 05/21/2018   Pure hypercholesterolemia    Sciatica of right side    Seasonal allergic rhinitis    Tobacco dependence    Tubular adenoma of colon    Vitamin D deficiency    Past Surgical History:  Procedure Laterality Date   BIOPSY  06/26/2019   Procedure: BIOPSY;  Surgeon: Charlott Rakes, MD;  Location: [facility] ENDOSCOPY;  Service: Endoscopy;;   BIV PACEMAKER INSERTION CRT-P N/A 05/21/2018   Procedure: BIV PACEMAKER INSERTION CRT-P;  Surgeon: Duke Salvia, MD;  Location: [facility] INVASIVE CV LAB;  Service: Cardiovascular;  Laterality: N/A;   BREAST SURGERY     small mass removed from left breast--benign   CRYO INTERCOSTAL NERVE BLOCK Right 04/29/2015   Procedure: CRYO INTERCOSTAL NERVE BLOCK;  Surgeon: Loreli Slot, MD;  Location: [facility] OR;  Service:  Thoracic;  Laterality: Right;   ESOPHAGOGASTRODUODENOSCOPY (EGD) WITH PROPOFOL N/A 06/26/2019   Procedure: ESOPHAGOGASTRODUODENOSCOPY (EGD) WITH PROPOFOL;  Surgeon: Charlott Rakes, MD;  Location: [facility] ENDOSCOPY;  Service: Endoscopy;  Laterality: N/A;   INSERT / REPLACE / REMOVE PACEMAKER  05/21/2018   LOBECTOMY Right 04/29/2015   Procedure: RIGHT LOWER LUNG LOBECTOMY ;  Surgeon: Loreli Slot, MD;  Location: [facility] OR;  Service: Thoracic;   Laterality: Right;   LYMPH NODE DISSECTION Right 04/29/2015   Procedure: RIGHT LUNG LYMPH NODE DISSECTION;  Surgeon: Loreli Slot, MD;  Location: [facility] OR;  Service: Thoracic;  Laterality: Right;   RIGHT/LEFT HEART CATH AND CORONARY ANGIOGRAPHY N/A 04/24/2018   Procedure: RIGHT/LEFT HEART CATH AND CORONARY ANGIOGRAPHY;  Surgeon: Runell Gess, MD;  Location: [facility] INVASIVE CV LAB;  Service: Cardiovascular;  Laterality: N/A;   VAGINAL DELIVERY     51 yrs ago   VIDEO ASSISTED THORACOSCOPY (VATS)/ LOBECTOMY Right 04/29/2015   Procedure: RIGHT VIDEO ASSISTED THORACOSCOPY (VATS) WEDGE RESECTION/ RIGHT LOWER LOBECTOMY, CRYO-ANALGESIA OF INTERCOSTAL NERVES;  Surgeon: Loreli Slot, MD;  Location: [facility] OR;  Service: Thoracic;  Laterality: Right;     A IV Location/Drains/Wounds Patient Lines/Drains/Airways Status     Active Line/Drains/Airways     Name Placement date Placement time Site Days   Peripheral IV 10/01/23 22 G Left;Posterior Hand 10/01/23  2116  Hand  1   Peripheral IV 10/02/23 20 G 1.88" Left Forearm 10/02/23  0107  Forearm  less than 1            Intake/Output Last 24 hours No intake or output data in the 24 hours ending 10/02/23 0220  Labs/Imaging Results for orders placed or performed during the hospital encounter of 10/01/23 (from the past 48 hour(s))  Urinalysis, w/ Reflex to Culture (Infection Suspected) -Urine, Catheterized     Status: Abnormal   Collection Time: 10/01/23 10:31 PM  Result Value Ref Range   Specimen Source URINE, CLEAN CATCH    Color, Urine YELLOW YELLOW   APPearance TURBID (A) CLEAR   Specific Gravity, Urine 1.013 1.005 - 1.030   pH 5.0 5.0 - 8.0   Glucose, UA NEGATIVE NEGATIVE mg/dL   Hgb urine dipstick MODERATE (A) NEGATIVE   Bilirubin Urine NEGATIVE NEGATIVE   Ketones, ur NEGATIVE NEGATIVE mg/dL   Protein, ur 161 (A) NEGATIVE mg/dL   Nitrite NEGATIVE NEGATIVE   Leukocytes,Ua MODERATE (A) NEGATIVE   RBC / HPF 21-50 0 - 5 RBC/hpf    WBC, UA >50 0 - 5 WBC/hpf    Comment:        Reflex urine culture not performed if WBC <=10, OR if Squamous epithelial cells >5. If Squamous epithelial cells >5 suggest recollection.    Bacteria, UA MANY (A) NONE SEEN   Squamous Epithelial / HPF 0-5 0 - 5 /HPF   Mucus PRESENT     Comment: Performed at [facility] Lab, [address] [place], [place] [address]  CBC     Status: Abnormal   Collection Time: 10/01/23 10:47 PM  Result Value Ref Range   WBC 14.5 (H) 4.0 - 10.5 K/uL   RBC 4.70 3.87 - 5.11 MIL/uL   Hemoglobin 13.2 12.0 - 15.0 g/dL   HCT 54.0 (H) 98.1 - 19.1 %   MCV 100.9 (H) 80.0 - 100.0 fL   MCH 28.1 26.0 - 34.0 pg   MCHC 27.8 (L) 30.0 - 36.0 g/dL   RDW 47.8 (H) 29.5 - 62.1 %   Platelets  291 150 - 400 K/uL   nRBC 0.2 0.0 - 0.2 %    Comment: Performed at [facility] Lab, [address] [place], [place] [address]  Basic metabolic panel     Status: Abnormal   Collection Time: 10/01/23 10:47 PM  Result Value Ref Range   Sodium 165 (HH) 135 - 145 mmol/L    Comment: CRITICAL RESULT CALLED TO, READ BACK BY AND VERIFIED WITH Kathalene Frames. RN @ [phone] 10/01/23 JBUTLER FIRST ATTEMPT TO CALL CRITICAL MADE AT 2340 10/01/23 JBUTLER    Potassium 5.0 3.5 - 5.1 mmol/L   Chloride 127 (H) 98 - 111 mmol/L   CO2 17 (L) 22 - 32 mmol/L   Glucose, Bld 132 (H) 70 - 99 mg/dL    Comment: Glucose reference range applies only to samples taken after fasting for at least 8 hours.   BUN 152 (H) 8 - 23 mg/dL   Creatinine, Ser 5.62 (H) 0.44 - 1.00 mg/dL   Calcium 9.2 8.9 - 13.0 mg/dL   GFR, Estimated 5 (L) >60 mL/min    Comment: (NOTE) Calculated using the CKD-EPI Creatinine Equation (2021)    Anion gap 21 (H) 5 - 15    Comment: ELECTROLYTES REPEATED TO VERIFY Performed at [facility], [address] [place], [place] [address]   Hepatic function panel     Status: Abnormal   Collection Time: 10/02/23 12:04 AM  Result Value Ref Range   Total Protein 8.6 (H) 6.5 - 8.1 g/dL    Albumin 3.0 (L) 3.5 - 5.0 g/dL   AST 19 15 - 41 U/L   ALT 11 0 - 44 U/L   Alkaline Phosphatase 89 38 - 126 U/L   Total Bilirubin 0.8 <1.2 mg/dL   Bilirubin, Direct 0.1 0.0 - 0.2 mg/dL   Indirect Bilirubin 0.7 0.3 - 0.9 mg/dL    Comment: Performed at [facility], [address], [place], [place] [address]  I-Stat Lactic Acid     Status: None   Collection Time: 10/02/23 12:18 AM  Result Value Ref Range   Lactic Acid, Venous 1.9 0.5 - 1.9 mmol/L   CT Renal Stone Study  Result Date: 10/02/2023 CLINICAL DATA:  Abdominal pain. EXAM: CT ABDOMEN AND PELVIS WITHOUT CONTRAST TECHNIQUE: Multidetector CT imaging of the abdomen and pelvis was performed following the standard protocol without IV contrast. RADIATION DOSE REDUCTION: This exam was performed according to the departmental dose-optimization program which includes automated exposure control, adjustment of the mA and/or kV according to patient size and/or use of iterative reconstruction technique. COMPARISON:  CT chest abdomen and pelvis 06/25/2023 FINDINGS: Lower chest: No acute abnormality. Hepatobiliary: The gallbladder is distended. Gallstones are present. There is no biliary ductal dilatation. No focal liver lesions are seen. Pancreas: Unremarkable. No pancreatic ductal dilatation or surrounding inflammatory changes. Spleen: Normal in size without focal abnormality. Adrenals/Urinary Tract: Bilateral renal cysts are again seen. The largest is in the right kidney measuring up to 2.1 cm. There are no urinary tract calculi. There is no hydronephrosis. The adrenal glands and bladder are within normal limits. Stomach/Bowel: The proximal appendix appears within normal limits. The distal appendix is not well delineated in the pelvis. No focal inflammation, bowel obstruction or free air identified. There is sigmoid colon diverticulosis. Vascular/Lymphatic: Aortic atherosclerosis. No enlarged abdominal or pelvic lymph nodes. Reproductive: Coarse rounded  calcifications are seen in the pelvis which likely represent fibroids measuring up to 2.7 cm. The ovaries are not well delineated. Other:  No significant free fluid or free air. No abdominal wall hernia. Small amount of subcutaneous air in the anterior lower right abdominal wall likely represents medication injection site. There some mild subcutaneous stranding in the right inguinal region and along the right lateral body wall. Musculoskeletal: Degenerative changes affect the hips and spine. IMPRESSION: 1. Distended gallbladder with gallstones. Correlate clinically for cholecystitis. 2. Colonic diverticulosis. 3. Uterine fibroids. 4. Mild subcutaneous stranding in the right inguinal region and along the right lateral body wall. Correlate clinically for cellulitis. 5. Right Bosniak I benign renal cyst measuring 2.1 cm. No follow-up imaging is recommended. JACR 2018 Feb; 264-273, Management of the Incidental Renal Mass on CT, [employer] 2021; [id number], Bosniak Classification of Cystic Renal Masses, Version 2019. Aortic Atherosclerosis (ICD10-I70.0). Electronically Signed   By: Darliss Cheney M.D.   On: 10/02/2023 01:58   DG Chest Port 1 View  Result Date: 10/01/2023 CLINICAL DATA:  Weakness EXAM: PORTABLE CHEST 1 VIEW COMPARISON:  07/28/2023 FINDINGS: Stable cardiomediastinal silhouette. Aortic atherosclerotic calcification. Left chest wall CRT-P. Similar interstitial opacities in the left mid and lower lung. No pleural effusion or pneumothorax. IMPRESSION: Similar interstitial opacities in the left mid and lower lung which may be chronic or due to atypical infection or aspiration. Electronically Signed   By: Minerva Fester M.D.   On: 10/01/2023 23:56    Pending Labs Unresulted Labs (From admission, onward)     Start     Ordered   10/01/23 2231  Urine Culture  Once,   R        10/01/23 2231   10/01/23 2222  Blood culture (routine x 2)  BLOOD CULTURE X 2,   R (with STAT occurrences)      10/01/23 2221             Vitals/Pain Today's Vitals   10/01/23 2145 10/01/23 2152 10/01/23 2152 10/02/23 0215  BP: 130/87     Pulse: (!) 115     Resp: (!) 25   20  Temp:      TempSrc:      SpO2: 98%  97%   Weight:  50.3 kg    Height:  5\' 4"  (1.626 m)      Isolation Precautions No active isolations  Medications Medications  0.9 %  sodium chloride infusion (has no administration in time range)  lactated ringers bolus 1,000 mL (0 mLs Intravenous Stopped 10/02/23 0155)  cefTRIAXone (ROCEPHIN) 1 g in sodium chloride 0.9 % 100 mL IVPB (0 g Intravenous Stopped 10/02/23 0213)    Mobility power wheelchair  - Has not been able to work wheel chair lately due to weakness.   Focused Assessments Neuro Assessment Handoff:  Swallow screen pass? No          Neuro Assessment: Exceptions to WDL Neuro Checks:      Has TPA been given? No If patient is a Neuro Trauma and patient is going to OR before floor call report to 4N Charge nurse: [phone] or [phone]   R Recommendations: See Admitting Provider Note  Report given to:   Additional Notes:   Patient responds to pain and voice; unknown if this is baseline. Appears to be declining in health overall. Patient had very little urine in bladder when straight-cath; urine was thick and cloudy.

## 2023-10-03 DIAGNOSIS — F0150 Vascular dementia without behavioral disturbance: Secondary | ICD-10-CM | POA: Diagnosis not present

## 2023-10-03 DIAGNOSIS — E870 Hyperosmolality and hypernatremia: Secondary | ICD-10-CM | POA: Diagnosis not present

## 2023-10-03 DIAGNOSIS — N179 Acute kidney failure, unspecified: Secondary | ICD-10-CM | POA: Diagnosis not present

## 2023-10-03 DIAGNOSIS — I10 Essential (primary) hypertension: Secondary | ICD-10-CM | POA: Diagnosis not present

## 2023-10-03 LAB — BLOOD CULTURE ID PANEL (REFLEXED) - BCID2

## 2023-10-03 LAB — CBC
HCT: 37.4 % (ref 36.0–46.0)
Hemoglobin: 11.6 g/dL — ABNORMAL LOW (ref 12.0–15.0)
MCH: 28 pg (ref 26.0–34.0)
MCHC: 31 g/dL (ref 30.0–36.0)
MCV: 90.3 fL (ref 80.0–100.0)
Platelets: 260 10*3/uL (ref 150–400)
RBC: 4.14 MIL/uL (ref 3.87–5.11)
RDW: 15.4 % (ref 11.5–15.5)
WBC: 19 10*3/uL — ABNORMAL HIGH (ref 4.0–10.5)
nRBC: 0 % (ref 0.0–0.2)

## 2023-10-03 LAB — BASIC METABOLIC PANEL
Anion gap: 12 (ref 5–15)
BUN: 108 mg/dL — ABNORMAL HIGH (ref 8–23)
CO2: 20 mmol/L — ABNORMAL LOW (ref 22–32)
Calcium: 8.4 mg/dL — ABNORMAL LOW (ref 8.9–10.3)
Chloride: 119 mmol/L — ABNORMAL HIGH (ref 98–111)
Creatinine, Ser: 6.01 mg/dL — ABNORMAL HIGH (ref 0.44–1.00)
GFR, Estimated: 7 mL/min — ABNORMAL LOW (ref >60)
Glucose, Bld: 180 mg/dL — ABNORMAL HIGH (ref 70–99)
Potassium: 3.4 mmol/L — ABNORMAL LOW (ref 3.5–5.1)
Sodium: 151 mmol/L — ABNORMAL HIGH (ref 135–145)

## 2023-10-03 MED ORDER — SODIUM CHLORIDE 0.9 % IV SOLN
500.0000 mg | Freq: Every day | INTRAVENOUS | Status: DC
  Administered 2023-10-04: 500 mg via INTRAVENOUS
  Filled 2023-10-03: qty 10

## 2023-10-03 MED ORDER — MEROPENEM 1 G IV SOLR
1.0000 g | Freq: Once | INTRAVENOUS | Status: AC
  Administered 2023-10-03: 1 g via INTRAVENOUS
  Filled 2023-10-03: qty 20

## 2023-10-03 MED ORDER — POTASSIUM CHLORIDE 10 MEQ/100ML IV SOLN
10.0000 meq | INTRAVENOUS | Status: AC
  Administered 2023-10-03 (×3): 10 meq via INTRAVENOUS
  Filled 2023-10-03 (×3): qty 100

## 2023-10-03 NOTE — Progress Notes (Signed)
PHARMACY - PHYSICIAN COMMUNICATION CRITICAL VALUE ALERT - BLOOD CULTURE IDENTIFICATION (BCID)  Betty [name] is an 77 y.o. female who presented to [facility] on 10/01/2023 with a chief complaint of AMS/UTI  Assessment:   Blood culture growing ESBL producing E. coli  Name of physician (or Provider) Contacted:  Dr. Arlean Hopping  Current antibiotics:  Rocephin  Changes to prescribed antibiotics recommended:  Change to Meropenem 500 mg IV q24h   Results for orders placed or performed during the hospital encounter of 10/01/23  Blood Culture ID Panel (Reflexed) (Collected: 10/02/2023 12:02 AM)  Result Value Ref Range   Enterococcus faecalis NOT DETECTED NOT DETECTED   Enterococcus Faecium NOT DETECTED NOT DETECTED   Listeria monocytogenes NOT DETECTED NOT DETECTED   Staphylococcus species NOT DETECTED NOT DETECTED   Staphylococcus aureus (BCID) NOT DETECTED NOT DETECTED   Staphylococcus epidermidis NOT DETECTED NOT DETECTED   Staphylococcus lugdunensis NOT DETECTED NOT DETECTED   Streptococcus species NOT DETECTED NOT DETECTED   Streptococcus agalactiae NOT DETECTED NOT DETECTED   Streptococcus pneumoniae NOT DETECTED NOT DETECTED   Streptococcus pyogenes NOT DETECTED NOT DETECTED   A.calcoaceticus-baumannii NOT DETECTED NOT DETECTED   Bacteroides fragilis NOT DETECTED NOT DETECTED   Enterobacterales DETECTED (A) NOT DETECTED   Enterobacter cloacae complex NOT DETECTED NOT DETECTED   Escherichia coli DETECTED (A) NOT DETECTED   Klebsiella aerogenes NOT DETECTED NOT DETECTED   Klebsiella oxytoca NOT DETECTED NOT DETECTED   Klebsiella pneumoniae NOT DETECTED NOT DETECTED   Proteus species NOT DETECTED NOT DETECTED   Salmonella species NOT DETECTED NOT DETECTED   Serratia marcescens NOT DETECTED NOT DETECTED   Haemophilus influenzae NOT DETECTED NOT DETECTED   Neisseria meningitidis NOT DETECTED NOT DETECTED   Pseudomonas aeruginosa NOT DETECTED NOT DETECTED   Stenotrophomonas  maltophilia NOT DETECTED NOT DETECTED   Candida albicans NOT DETECTED NOT DETECTED   Candida auris NOT DETECTED NOT DETECTED   Candida glabrata NOT DETECTED NOT DETECTED   Candida krusei NOT DETECTED NOT DETECTED   Candida parapsilosis NOT DETECTED NOT DETECTED   Candida tropicalis NOT DETECTED NOT DETECTED   Cryptococcus neoformans/gattii NOT DETECTED NOT DETECTED   CTX-M ESBL DETECTED (A) NOT DETECTED   Carbapenem resistance IMP NOT DETECTED NOT DETECTED   Carbapenem resistance KPC NOT DETECTED NOT DETECTED   Carbapenem resistance NDM NOT DETECTED NOT DETECTED   Carbapenem resist OXA 48 LIKE NOT DETECTED NOT DETECTED   Carbapenem resistance VIM NOT DETECTED NOT DETECTED    Eddie Candle 10/03/2023  4:59 AM

## 2023-10-03 NOTE — TOC Progression Note (Signed)
Transition of Care [facility]) - Progression Note    Patient Details  Name: Betty [name] MRN: [id number] Date of Birth: [birth year]  [facility] CM/SW Contact  Inis Sizer, LCSW Phone Number: 10/03/2023, 2:06 PM  Clinical Narrative:    CSW spoke with Lowella Bandy at [facility] who states patient is LTC at the facility and a new authorization is not required for her to return when medically stable for discharge.       Expected Discharge Plan and Services                                               Social Determinants of Health (SDOH) Interventions SDOH Screenings   Food Insecurity: No Food Insecurity (10/02/2023)  Housing: Low Risk  (10/02/2023)  Transportation Needs: No Transportation Needs (10/02/2023)  Utilities: Not At Risk (10/02/2023)  Alcohol Screen: Low Risk  (11/28/2021)  Depression (PHQ2-9): Low Risk  (11/28/2021)  Financial Resource Strain: Low Risk  (11/28/2021)  Physical Activity: Inactive (11/28/2021)  Social Connections: Unknown (11/28/2021)  Stress: No Stress Concern Present (11/28/2021)  Tobacco Use: Medium Risk (10/02/2023)    Readmission Risk Interventions     No data to display

## 2023-10-03 NOTE — Progress Notes (Signed)
PROGRESS NOTE  Betty [name] ZOX:[id number] DOB: [birth year]   PCP: Collene Mares, PA  Patient is from: SNF  DOA: 10/01/2023 LOS: 1  Chief complaints Chief Complaint  Patient presents with   Altered Mental Status     Brief Narrative / Interim history: 77 year old F with PMH of dementia, COPD, CKD-4, CAD, HFrEF and lung cancer sent to ED from SNF with altered mental status and abnormal labs including severe hyponatremia and AKI.  Reportedly has not been eating or drinking in several days, and found on the floor of a facility per report of his son.  In ED, slightly tachycardic but normotensive.  Afebrile.  Labs are most notable for sodium 165, BUN 152, creatinine 8.03, WBC 14,500, and normal lactic acid.  No acute findings are noted on head CT or cervical spine CT.  No hydronephrosis is seen on CT of the abdomen and pelvis.  Received 1 L of LR bolus and started on IV dextrose, IV ceftriaxone, and admitted.  Blood culture with ESBL E. coli.  Antibiotic escalated to IV meropenem.  Hypernatremia and AKI improved with IV dextrose, slightly at rapid rate.  Dextrose discontinued.  Persistent encephalopathy.  Palliative medicine following.  Subjective: Seen and examined earlier this morning.  No major events overnight of this morning.  She is sleepy but wakes to voice.  Not able to hold conversation.  Able to squeeze my fingers with both hands but did not follow other commands.  Does not appear to be in distress.  Objective: Vitals:   10/02/23 2344 10/03/23 0329 10/03/23 0330 10/03/23 0747  BP: 131/77 139/75  129/72  Pulse: (!) 103 (!) 108  (!) 106  Resp: 18 18  18   Temp: 98.6 F (37 C) 98.2 F (36.8 C)  98 F (36.7 C)  TempSrc: Oral Oral  Oral  SpO2: 100% 98%  98%  Weight:   46.8 kg   Height:        Examination:  GENERAL: Appears frail.  No apparent distress.  Nontoxic. HEENT: MMM.  Vision and hearing grossly intact.  NECK: Supple.  No apparent JVD.  RESP:  No IWOB.  Fair  aeration bilaterally. CVS:  RRR. Heart sounds normal.  ABD/GI/GU: BS+. Abd soft, NTND.  MSK/EXT:  Moves extremities. No apparent deformity. No edema.  SKIN: no apparent skin lesion or wound NEURO: Sleepy.  Wakes to voice but does not hold conversation.  Barely follows commands.  PERRL.  No apparent focal neuro deficit but limited exam. PSYCH: Calm. Normal affect.   Procedures:  None  Microbiology summarized: Blood cultures with ESBL E. coli. Urine culture with multiple species.  Assessment and plan: Acute renal failure superimposed on CKD IV/uremia: Baseline Cr 3.0-3.1.  Likely prerenal from poor p.o. intake in the setting of severe dementia.  No hydronephrosis or obstructive etiology on CT.  Received LR bolus in ED.  AKI improving with IV fluid.  Per discussion between admitting provider and son, no dialysis, aggressive for invasive investigation.  Recent Labs    07/05/23 0609 07/06/23 0603 07/28/23 1743 07/29/23 1108 10/01/23 2247 10/02/23 0445 10/02/23 1006 10/02/23 1552 10/02/23 2114 10/03/23 0423  BUN 56* 49* 56* 47* 152* 145* 138* 132* 122* 108*  CREATININE 4.09* 3.78* 3.07* 3.15* 8.03* 7.55* 7.35* 6.90* 6.36* 6.01*  -Hold dextrose given rapid correction of sodium -Continue monitoring  Hypernatremia: Likely due to dehydration/poor p.o. intake. Recent Labs  Lab 10/01/23 2247 10/02/23 0445 10/02/23 1006 10/02/23 1552 10/02/23 2114 10/03/23 0423  NA 165* 165*  164* 161* 157* 151*  -Dextrose discontinued due to rapid correction of sodium -Monitor BMP  ESBL E. coli bacteremia with possible urinary tract infection: UA concerning for UTI.  Blood culture with ESBL E. coli.  Urine culture with multiple species.  -Antibiotics changed to IV meropenem   Acute metabolic encephalopathy-multifactorial including uremia, hypernatremia, UTI and dehydration.  Still somewhat somnolent.  No focal neurodeficit.  CT head and cervical spine without acute finding. -Treat treatable  causes -Reorientation and delirium precaution -Minimize or avoid sedating medications  Chronic HFrEF: TTE in 11/2022 with LVEF of 30 to 35%.  Dehydrated -Monitor fluid and respiratory status while on IV fluid  Chronic COPD: Stable -Nebulizers as needed   History of lung cancer: S/p right upper lobectomy and chemo in 2016.  New lung nodules noted during recent admission and follow-up with oncology was advised should family choose to purse that   Failure to thrive: Patient with severe dementia complicated by advanced disease process as above.  Poor long-term prognosis.  Appropriately DNR/DNI. -Palliative medicine following.  Hypokalemia -Monitor replenish as appropriate  Leukocytosis/bandemia: Likely due to infection -Antibiotics as above -Continue monitoring  Severe malnutrition/failure to thrive Body mass index is 17.71 kg/m. -Consult dietitian          DVT prophylaxis:  heparin injection 5,000 Units Start: 10/02/23 0600  Code Status: DNR/DNI Family Communication: None at bedside Level of care: Telemetry Medical Status is: Inpatient Remains inpatient appropriate because: AKI/uremia, hypernatremia, UTI, dehydration and failure to thrive   Final disposition: Likely back to SNF Consultants:  Palliative medicine  55 minutes with more than 50% spent in reviewing records, counseling patient/family and coordinating care.   Sch Meds:  Scheduled Meds:  heparin  5,000 Units Subcutaneous Q8H   sodium chloride flush  3 mL Intravenous Q12H   Continuous Infusions:  [START ON 10/04/2023] meropenem (MERREM) IV     potassium chloride 10 mEq (10/03/23 1033)   PRN Meds:.acetaminophen **OR** acetaminophen, ipratropium-albuterol, prochlorperazine  Antimicrobials: Anti-infectives (From admission, onward)    Start     Dose/Rate Route Frequency Ordered Stop   10/04/23 1000  meropenem (MERREM) 500 mg in sodium chloride 0.9 % 100 mL IVPB        500 mg 200 mL/hr over 30 Minutes  Intravenous Daily 10/03/23 0503     10/03/23 0600  meropenem (MERREM) 1 g in sodium chloride 0.9 % 100 mL IVPB        1 g 200 mL/hr over 30 Minutes Intravenous  Once 10/03/23 0503 10/03/23 0632   10/02/23 2300  cefTRIAXone (ROCEPHIN) 1 g in sodium chloride 0.9 % 100 mL IVPB  Status:  Discontinued        1 g 200 mL/hr over 30 Minutes Intravenous Every 24 hours 10/02/23 0412 10/03/23 0503   10/01/23 2230  cefTRIAXone (ROCEPHIN) 1 g in sodium chloride 0.9 % 100 mL IVPB        1 g 200 mL/hr over 30 Minutes Intravenous  Once 10/01/23 2222 10/02/23 0213        I have personally reviewed the following labs and images: CBC: Recent Labs  Lab 10/01/23 2247 10/02/23 0731 10/03/23 0423  WBC 14.5* 19.7* 19.0*  HGB 13.2 11.3* 11.6*  HCT 47.4* 35.2* 37.4  MCV 100.9* 90.5 90.3  PLT 291 263 260   BMP &GFR Recent Labs  Lab 10/02/23 0445 10/02/23 1006 10/02/23 1552 10/02/23 2114 10/03/23 0423  NA 165* 164* 161* 157* 151*  K 5.0 4.3 4.8 3.8 3.4*  CL  124* 125* 125* 121* 119*  CO2 22 20* 19* 21* 20*  GLUCOSE 116* 160* 159* 182* 180*  BUN 145* 138* 132* 122* 108*  CREATININE 7.55* 7.35* 6.90* 6.36* 6.01*  CALCIUM 9.0 8.8* 8.7* 8.9 8.4*   Estimated Creatinine Clearance: 5.7 mL/min (A) (by C-G formula based on SCr of 6.01 mg/dL (H)). Liver & Pancreas: Recent Labs  Lab 10/02/23 0004  AST 19  ALT 11  ALKPHOS 89  BILITOT 0.8  PROT 8.6*  ALBUMIN 3.0*   No results for input(s): "LIPASE", "AMYLASE" in the last 168 hours. No results for input(s): "AMMONIA" in the last 168 hours. Diabetic: No results for input(s): "HGBA1C" in the last 72 hours. No results for input(s): "GLUCAP" in the last 168 hours. Cardiac Enzymes: No results for input(s): "CKTOTAL", "CKMB", "CKMBINDEX", "TROPONINI" in the last 168 hours. No results for input(s): "PROBNP" in the last 8760 hours. Coagulation Profile: No results for input(s): "INR", "PROTIME" in the last 168 hours. Thyroid Function Tests: No  results for input(s): "TSH", "T4TOTAL", "FREET4", "T3FREE", "THYROIDAB" in the last 72 hours. Lipid Profile: No results for input(s): "CHOL", "HDL", "LDLCALC", "TRIG", "CHOLHDL", "LDLDIRECT" in the last 72 hours. Anemia Panel: No results for input(s): "VITAMINB12", "FOLATE", "FERRITIN", "TIBC", "IRON", "RETICCTPCT" in the last 72 hours. Urine analysis:    Component Value Date/Time   COLORURINE YELLOW 10/01/2023 2231   APPEARANCEUR TURBID (A) 10/01/2023 2231   LABSPEC 1.013 10/01/2023 2231   PHURINE 5.0 10/01/2023 2231   GLUCOSEU NEGATIVE 10/01/2023 2231   HGBUR MODERATE (A) 10/01/2023 2231   BILIRUBINUR NEGATIVE 10/01/2023 2231   KETONESUR NEGATIVE 10/01/2023 2231   PROTEINUR 100 (A) 10/01/2023 2231   UROBILINOGEN 0.2 04/27/2015 0834   NITRITE NEGATIVE 10/01/2023 2231   LEUKOCYTESUR MODERATE (A) 10/01/2023 2231   Sepsis Labs: Invalid input(s): "PROCALCITONIN", "LACTICIDVEN"  Microbiology: Recent Results (from the past 240 hour(s))  Urine Culture     Status: Abnormal   Collection Time: 10/01/23 10:31 PM   Specimen: Urine, Random  Result Value Ref Range Status   Specimen Description URINE, RANDOM  Final   Special Requests   Final    NONE Reflexed from [id number] Performed at [facility], [address] [place], [place] [address]    Culture MULTIPLE SPECIES PRESENT, SUGGEST RECOLLECTION (A)  Final   Report Status 10/02/2023 FINAL  Final  Blood culture (routine x 2)     Status: None (Preliminary result)   Collection Time: 10/01/23 11:21 PM   Specimen: BLOOD RIGHT HAND  Result Value Ref Range Status   Specimen Description BLOOD RIGHT HAND  Final   Special Requests   Final    BOTTLES DRAWN AEROBIC AND ANAEROBIC Blood Culture results may not be optimal due to an inadequate volume of blood received in culture bottles   Culture   Final    NO GROWTH 2 DAYS Performed at [facility], [address], [place], [place] [address]    Report Status PENDING  Incomplete  Blood  culture (routine x 2)     Status: None (Preliminary result)   Collection Time: 10/02/23 12:02 AM   Specimen: BLOOD RIGHT HAND  Result Value Ref Range Status   Specimen Description BLOOD RIGHT HAND  Final   Special Requests   Final    BOTTLES DRAWN AEROBIC AND ANAEROBIC Blood Culture adequate volume   Culture  Setup Time   Final    GRAM NEGATIVE RODS AEROBIC BOTTLE ONLY CRITICAL RESULT CALLED TO, READ BACK BY AND VERIFIED  WITH: PHARMD G ABBOTT 10/03/23 @ 0423 BY AB Performed at [facility], [address], [place], [place] [address]    Culture GRAM NEGATIVE RODS  Final   Report Status PENDING  Incomplete  Blood Culture ID Panel (Reflexed)     Status: Abnormal   Collection Time: 10/02/23 12:02 AM  Result Value Ref Range Status   Enterococcus faecalis NOT DETECTED NOT DETECTED Final   Enterococcus Faecium NOT DETECTED NOT DETECTED Final   Listeria monocytogenes NOT DETECTED NOT DETECTED Final   Staphylococcus species NOT DETECTED NOT DETECTED Final   Staphylococcus aureus (BCID) NOT DETECTED NOT DETECTED Final   Staphylococcus epidermidis NOT DETECTED NOT DETECTED Final   Staphylococcus lugdunensis NOT DETECTED NOT DETECTED Final   Streptococcus species NOT DETECTED NOT DETECTED Final   Streptococcus agalactiae NOT DETECTED NOT DETECTED Final   Streptococcus pneumoniae NOT DETECTED NOT DETECTED Final   Streptococcus pyogenes NOT DETECTED NOT DETECTED Final   A.calcoaceticus-baumannii NOT DETECTED NOT DETECTED Final   Bacteroides fragilis NOT DETECTED NOT DETECTED Final   Enterobacterales DETECTED (A) NOT DETECTED Final    Comment: Enterobacterales represent a large order of gram negative bacteria, not a single organism. CRITICAL RESULT CALLED TO, READ BACK BY AND VERIFIED WITH: PHARMD G ABBOTT 10/03/23 @ 0423 BY AB    Enterobacter cloacae complex NOT DETECTED NOT DETECTED Final   Escherichia coli DETECTED (A) NOT DETECTED Final    Comment: CRITICAL RESULT CALLED TO, READ  BACK BY AND VERIFIED WITH: PHARMD G ABBOTT 10/03/23 @ 0423 BY AB    Klebsiella aerogenes NOT DETECTED NOT DETECTED Final   Klebsiella oxytoca NOT DETECTED NOT DETECTED Final   Klebsiella pneumoniae NOT DETECTED NOT DETECTED Final   Proteus species NOT DETECTED NOT DETECTED Final   Salmonella species NOT DETECTED NOT DETECTED Final   Serratia marcescens NOT DETECTED NOT DETECTED Final   Haemophilus influenzae NOT DETECTED NOT DETECTED Final   Neisseria meningitidis NOT DETECTED NOT DETECTED Final   Pseudomonas aeruginosa NOT DETECTED NOT DETECTED Final   Stenotrophomonas maltophilia NOT DETECTED NOT DETECTED Final   Candida albicans NOT DETECTED NOT DETECTED Final   Candida auris NOT DETECTED NOT DETECTED Final   Candida glabrata NOT DETECTED NOT DETECTED Final   Candida krusei NOT DETECTED NOT DETECTED Final   Candida parapsilosis NOT DETECTED NOT DETECTED Final   Candida tropicalis NOT DETECTED NOT DETECTED Final   Cryptococcus neoformans/gattii NOT DETECTED NOT DETECTED Final   CTX-M ESBL DETECTED (A) NOT DETECTED Final    Comment: CRITICAL RESULT CALLED TO, READ BACK BY AND VERIFIED WITH: PHARMD G ABBOTT 10/03/23 @ 0423 BY AB (NOTE) Extended spectrum beta-lactamase detected. Recommend a carbapenem as initial therapy.      Carbapenem resistance IMP NOT DETECTED NOT DETECTED Final   Carbapenem resistance KPC NOT DETECTED NOT DETECTED Final   Carbapenem resistance NDM NOT DETECTED NOT DETECTED Final   Carbapenem resist OXA 48 LIKE NOT DETECTED NOT DETECTED Final   Carbapenem resistance VIM NOT DETECTED NOT DETECTED Final    Comment: Performed at [facility], [address] [place], [place] [address]    Radiology Studies: No results found.    Najib Colmenares T. Chequita Mofield [facility]  If 7PM-7AM, please contact night-coverage [email or web address] 10/03/2023, 11:30 AM

## 2023-10-04 DIAGNOSIS — N179 Acute kidney failure, unspecified: Secondary | ICD-10-CM | POA: Diagnosis not present

## 2023-10-04 DIAGNOSIS — E870 Hyperosmolality and hypernatremia: Secondary | ICD-10-CM | POA: Diagnosis not present

## 2023-10-04 DIAGNOSIS — F0150 Vascular dementia without behavioral disturbance: Secondary | ICD-10-CM | POA: Diagnosis not present

## 2023-10-04 DIAGNOSIS — I10 Essential (primary) hypertension: Secondary | ICD-10-CM | POA: Diagnosis not present

## 2023-10-04 DIAGNOSIS — Z515 Encounter for palliative care: Secondary | ICD-10-CM

## 2023-10-04 LAB — COMPREHENSIVE METABOLIC PANEL
ALT: 11 U/L (ref 0–44)
AST: 19 U/L (ref 15–41)
Albumin: 2.5 g/dL — ABNORMAL LOW (ref 3.5–5.0)
Alkaline Phosphatase: 76 U/L (ref 38–126)
Anion gap: 14 (ref 5–15)
BUN: 99 mg/dL — ABNORMAL HIGH (ref 8–23)
CO2: 19 mmol/L — ABNORMAL LOW (ref 22–32)
Calcium: 9.3 mg/dL (ref 8.9–10.3)
Chloride: 124 mmol/L — ABNORMAL HIGH (ref 98–111)
Creatinine, Ser: 5.45 mg/dL — ABNORMAL HIGH (ref 0.44–1.00)
GFR, Estimated: 8 mL/min — ABNORMAL LOW (ref >60)
Glucose, Bld: 101 mg/dL — ABNORMAL HIGH (ref 70–99)
Potassium: 4.3 mmol/L (ref 3.5–5.1)
Sodium: 157 mmol/L — ABNORMAL HIGH (ref 135–145)
Total Bilirubin: 0.5 mg/dL (ref <1.2)
Total Protein: 7.6 g/dL (ref 6.5–8.1)

## 2023-10-04 LAB — CBC
HCT: 37.8 % (ref 36.0–46.0)
Hemoglobin: 11.9 g/dL — ABNORMAL LOW (ref 12.0–15.0)
MCH: 28.4 pg (ref 26.0–34.0)
MCHC: 31.5 g/dL (ref 30.0–36.0)
MCV: 90.2 fL (ref 80.0–100.0)
Platelets: 276 10*3/uL (ref 150–400)
RBC: 4.19 MIL/uL (ref 3.87–5.11)
RDW: 15.3 % (ref 11.5–15.5)
WBC: 14.6 10*3/uL — ABNORMAL HIGH (ref 4.0–10.5)
nRBC: 0 % (ref 0.0–0.2)

## 2023-10-04 LAB — MAGNESIUM: Magnesium: 2.6 mg/dL — ABNORMAL HIGH (ref 1.7–2.4)

## 2023-10-04 MED ORDER — ONDANSETRON 4 MG PO TBDP: 4.0000 mg | ORAL_TABLET | Freq: Four times a day (QID) | ORAL | Status: DC | PRN

## 2023-10-04 MED ORDER — HALOPERIDOL 1 MG PO TABS: 0.5000 mg | ORAL_TABLET | ORAL | Status: DC | PRN

## 2023-10-04 MED ORDER — GLYCOPYRROLATE 0.2 MG/ML IJ SOLN
0.2000 mg | INTRAMUSCULAR | Status: DC | PRN
  Administered 2023-10-06: 0.2 mg via INTRAVENOUS
  Filled 2023-10-04: qty 1

## 2023-10-04 MED ORDER — HALOPERIDOL LACTATE 2 MG/ML PO CONC: 0.5000 mg | ORAL | Status: DC | PRN

## 2023-10-04 MED ORDER — LORAZEPAM 1 MG PO TABS: 1.0000 mg | ORAL_TABLET | ORAL | Status: DC | PRN

## 2023-10-04 MED ORDER — ACETAMINOPHEN 325 MG PO TABS: 650.0000 mg | ORAL_TABLET | Freq: Four times a day (QID) | ORAL | Status: DC | PRN

## 2023-10-04 MED ORDER — POLYVINYL ALCOHOL 1.4 % OP SOLN: 1.0000 [drp] | Freq: Four times a day (QID) | OPHTHALMIC | Status: DC | PRN

## 2023-10-04 MED ORDER — HYDROMORPHONE HCL 1 MG/ML IJ SOLN
0.5000 mg | INTRAMUSCULAR | Status: DC | PRN
  Administered 2023-10-06: 2 mg via INTRAVENOUS
  Filled 2023-10-04: qty 2

## 2023-10-04 MED ORDER — LORAZEPAM 2 MG/ML PO CONC: 1.0000 mg | ORAL | Status: DC | PRN

## 2023-10-04 MED ORDER — ACETAMINOPHEN 650 MG RE SUPP: 650.0000 mg | Freq: Four times a day (QID) | RECTAL | Status: DC | PRN

## 2023-10-04 MED ORDER — GLYCOPYRROLATE 1 MG PO TABS: 1.0000 mg | ORAL_TABLET | ORAL | Status: DC | PRN

## 2023-10-04 MED ORDER — GLYCOPYRROLATE 0.2 MG/ML IJ SOLN: 0.2000 mg | INTRAMUSCULAR | Status: DC | PRN

## 2023-10-04 MED ORDER — BIOTENE DRY MOUTH MT LIQD: 15.0000 mL | OROMUCOSAL | Status: DC | PRN

## 2023-10-04 MED ORDER — ONDANSETRON HCL 4 MG/2ML IJ SOLN: 4.0000 mg | Freq: Four times a day (QID) | INTRAMUSCULAR | Status: DC | PRN

## 2023-10-04 MED ORDER — LORAZEPAM 2 MG/ML IJ SOLN: 1.0000 mg | INTRAMUSCULAR | Status: DC | PRN

## 2023-10-04 MED ORDER — HALOPERIDOL LACTATE 5 MG/ML IJ SOLN: 0.5000 mg | INTRAMUSCULAR | Status: DC | PRN

## 2023-10-04 MED ORDER — DEXTROSE 5 % IV SOLN: INTRAVENOUS | Status: DC

## 2023-10-04 NOTE — Progress Notes (Signed)
PROGRESS NOTE  Betty [name]:[id number] DOB: 24-Aug-1946   PCP: Collene Mares, PA  Patient is from: SNF  DOA: 10/01/2023 LOS: 2  Chief complaints Chief Complaint  Patient presents with   Altered Mental Status     Brief Narrative / Interim history: 77 year old F with PMH of dementia, COPD, CKD-4, CAD, HFrEF and lung cancer sent to ED from SNF with altered mental status and abnormal labs including severe hyponatremia and AKI.  Reportedly has not been eating or drinking in several days, and found on the floor of a facility per report of his son.  In ED, slightly tachycardic but normotensive.  Afebrile.  Labs are most notable for sodium 165, BUN 152, creatinine 8.03, WBC 14,500, and normal lactic acid.  No acute findings are noted on head CT or cervical spine CT.  No hydronephrosis is seen on CT of the abdomen and pelvis.  Received 1 L of LR bolus and started on IV dextrose, IV ceftriaxone, and admitted.  Blood culture with ESBL E. coli.  Antibiotic escalated to IV meropenem.  Hypernatremia and AKI improved with IV dextrose. Dextrose paused due to faster correction.  Persistent encephalopathy with overall decline despite improvement in numbers and appropriate antibiotics.  Palliative medicine consulted and following.  Patient has been transitioned to full comfort care on 10/04/2023 after discussion with patient's son, [name] over the phone.  Subjective: Seen and examined earlier this morning.  No major events overnight of this morning.  Remains somnolent.  Objective: Vitals:   10/03/23 1602 10/03/23 1940 10/04/23 0411 10/04/23 0821  BP: 128/78 126/82 124/79 125/75  Pulse: 100 (!) 102 (!) 106 (!) 105  Resp: 18 18 18 16   Temp: (!) 97.5 F (36.4 C)   97.7 F (36.5 C)  TempSrc: Oral     SpO2: 100% 98% 100% 100%  Weight:      Height:        Examination:  GENERAL: Appears frail.  Somnolent. HEENT: Vision and hearing grossly intact.  RESP:  No IWOB.  Fair aeration  bilaterally. CVS:  RRR. Heart sounds normal.  ABD/GI/GU: BS+. Abd soft, NTND.  MSK/EXT:  Moves extremities. No apparent deformity. No edema.  NEURO: Sleeping and somnolent.  Barely follows commands.  Procedures:  None  Microbiology summarized: Blood cultures with ESBL E. coli. Urine culture with multiple species.  Assessment and plan: End-of-life care/full comfort care-patient declined despite aggressive intervention for underlying illness.  Transition to full comfort care after discussion with patient's son, [name] over the phone -Comfort pathway with as needed meds-discussed this with patient's son -TOC consulted for residential hospice referral  Acute renal failure superimposed on CKD IV/uremia: Baseline Cr 3.0-3.1.  Likely prerenal from poor p.o. intake in the setting of severe dementia.  No hydronephrosis or obstructive etiology on CT.   Hypernatremia: Likely due to dehydration/poor p.o. intake.  ESBL E. coli bacteremia with possible urinary tract infection: UA concerning for UTI.  Blood culture with ESBL E. coli.  Urine culture with multiple species.  Initially on IV ceftriaxone and transitioned to IV meropenem   Acute metabolic encephalopathy-multifactorial including uremia, hypernatremia, bacteremia, dehydration and underlying dementia.  CT head without acute finding.  Remains encephalopathic despite appropriate treatment.  Chronic HFrEF: TTE in 11/2022 with LVEF of 30 to 35%.  Dehydrated  Chronic COPD: Stable   History of lung cancer: S/p right upper lobectomy and chemo in 2016.   Failure to thrive: Patient with severe dementia complicated by advanced disease process as above.  Poor prognosis.  Now full comfort care.  Hypokalemia  Leukocytosis/bandemia: Likely due to infection  Severe malnutrition/failure to thrive Body mass index is 17.71 kg/m.          DVT prophylaxis:  Patient is full comfort care.  Code Status: DNR/DNI Family Communication: Patient's  son, [name] over the phone Level of care: Palliative Care Status is: Inpatient Remains inpatient appropriate because: Residential hospice bed   Final disposition: Residential hospice Consultants:  Palliative medicine  55 minutes with more than 50% spent in reviewing records, counseling patient/family and coordinating care.   Sch Meds:  Scheduled Meds:   Continuous Infusions:   PRN Meds:.acetaminophen **OR** acetaminophen, antiseptic oral rinse, glycopyrrolate **OR** glycopyrrolate **OR** glycopyrrolate, haloperidol **OR** haloperidol **OR** haloperidol lactate, HYDROmorphone (DILAUDID) injection, LORazepam **OR** LORazepam **OR** LORazepam, ondansetron **OR** ondansetron (ZOFRAN) IV, polyvinyl alcohol  Antimicrobials: Anti-infectives (From admission, onward)    Start     Dose/Rate Route Frequency Ordered Stop   10/04/23 1000  meropenem (MERREM) 500 mg in sodium chloride 0.9 % 100 mL IVPB  Status:  Discontinued        500 mg 200 mL/hr over 30 Minutes Intravenous Daily 10/03/23 0503 10/04/23 1300   10/03/23 0600  meropenem (MERREM) 1 g in sodium chloride 0.9 % 100 mL IVPB        1 g 200 mL/hr over 30 Minutes Intravenous  Once 10/03/23 0503 10/03/23 0632   10/02/23 2300  cefTRIAXone (ROCEPHIN) 1 g in sodium chloride 0.9 % 100 mL IVPB  Status:  Discontinued        1 g 200 mL/hr over 30 Minutes Intravenous Every 24 hours 10/02/23 0412 10/03/23 0503   10/01/23 2230  cefTRIAXone (ROCEPHIN) 1 g in sodium chloride 0.9 % 100 mL IVPB        1 g 200 mL/hr over 30 Minutes Intravenous  Once 10/01/23 2222 10/02/23 0213        I have personally reviewed the following labs and images: CBC: Recent Labs  Lab 10/01/23 2247 10/02/23 0731 10/03/23 0423 10/04/23 0551  WBC 14.5* 19.7* 19.0* 14.6*  HGB 13.2 11.3* 11.6* 11.9*  HCT 47.4* 35.2* 37.4 37.8  MCV 100.9* 90.5 90.3 90.2  PLT 291 263 260 276   BMP &GFR Recent Labs  Lab 10/02/23 1006 10/02/23 1552 10/02/23 2114  10/03/23 0423 10/04/23 0551  NA 164* 161* 157* 151* 157*  K 4.3 4.8 3.8 3.4* 4.3  CL 125* 125* 121* 119* 124*  CO2 20* 19* 21* 20* 19*  GLUCOSE 160* 159* 182* 180* 101*  BUN 138* 132* 122* 108* 99*  CREATININE 7.35* 6.90* 6.36* 6.01* 5.45*  CALCIUM 8.8* 8.7* 8.9 8.4* 9.3  MG  --   --   --   --  2.6*   Estimated Creatinine Clearance: 6.3 mL/min (A) (by C-G formula based on SCr of 5.45 mg/dL (H)). Liver & Pancreas: Recent Labs  Lab 10/02/23 0004 10/04/23 0551  AST 19 19  ALT 11 11  ALKPHOS 89 76  BILITOT 0.8 0.5  PROT 8.6* 7.6  ALBUMIN 3.0* 2.5*   No results for input(s): "LIPASE", "AMYLASE" in the last 168 hours. No results for input(s): "AMMONIA" in the last 168 hours. Diabetic: No results for input(s): "HGBA1C" in the last 72 hours. No results for input(s): "GLUCAP" in the last 168 hours. Cardiac Enzymes: No results for input(s): "CKTOTAL", "CKMB", "CKMBINDEX", "TROPONINI" in the last 168 hours. No results for input(s): "PROBNP" in the last 8760 hours. Coagulation Profile: No results for input(s): "INR", "  PROTIME" in the last 168 hours. Thyroid Function Tests: No results for input(s): "TSH", "T4TOTAL", "FREET4", "T3FREE", "THYROIDAB" in the last 72 hours. Lipid Profile: No results for input(s): "CHOL", "HDL", "LDLCALC", "TRIG", "CHOLHDL", "LDLDIRECT" in the last 72 hours. Anemia Panel: No results for input(s): "VITAMINB12", "FOLATE", "FERRITIN", "TIBC", "IRON", "RETICCTPCT" in the last 72 hours. Urine analysis:    Component Value Date/Time   COLORURINE YELLOW 10/01/2023 2231   APPEARANCEUR TURBID (A) 10/01/2023 2231   LABSPEC 1.013 10/01/2023 2231   PHURINE 5.0 10/01/2023 2231   GLUCOSEU NEGATIVE 10/01/2023 2231   HGBUR MODERATE (A) 10/01/2023 2231   BILIRUBINUR NEGATIVE 10/01/2023 2231   KETONESUR NEGATIVE 10/01/2023 2231   PROTEINUR 100 (A) 10/01/2023 2231   UROBILINOGEN 0.2 04/27/2015 0834   NITRITE NEGATIVE 10/01/2023 2231   LEUKOCYTESUR MODERATE (A)  10/01/2023 2231   Sepsis Labs: Invalid input(s): "PROCALCITONIN", "LACTICIDVEN"  Microbiology: Recent Results (from the past 240 hour(s))  Urine Culture     Status: Abnormal   Collection Time: 10/01/23 10:31 PM   Specimen: Urine, Random  Result Value Ref Range Status   Specimen Description URINE, RANDOM  Final   Special Requests   Final    NONE Reflexed from [id number] Performed at [facility], [address], [place], [place] [address]    Culture MULTIPLE SPECIES PRESENT, SUGGEST RECOLLECTION (A)  Final   Report Status 10/02/2023 FINAL  Final  Blood culture (routine x 2)     Status: None (Preliminary result)   Collection Time: 10/01/23 11:21 PM   Specimen: BLOOD RIGHT HAND  Result Value Ref Range Status   Specimen Description BLOOD RIGHT HAND  Final   Special Requests   Final    BOTTLES DRAWN AEROBIC AND ANAEROBIC Blood Culture results may not be optimal due to an inadequate volume of blood received in culture bottles   Culture   Final    NO GROWTH 3 DAYS Performed at [facility], [address], [place], [place] [address]    Report Status PENDING  Incomplete  Blood culture (routine x 2)     Status: Abnormal (Preliminary result)   Collection Time: 10/02/23 12:02 AM   Specimen: BLOOD RIGHT HAND  Result Value Ref Range Status   Specimen Description BLOOD RIGHT HAND  Final   Special Requests   Final    BOTTLES DRAWN AEROBIC AND ANAEROBIC Blood Culture adequate volume   Culture  Setup Time   Final    GRAM NEGATIVE RODS AEROBIC BOTTLE ONLY CRITICAL RESULT CALLED TO, READ BACK BY AND VERIFIED WITH: PHARMD G ABBOTT 10/03/23 @ 0423 BY AB    Culture (A)  Final    ESCHERICHIA COLI SUSCEPTIBILITIES TO FOLLOW Performed at [facility], [address] [place], [place] [address]    Report Status PENDING  Incomplete  Blood Culture ID Panel (Reflexed)     Status: Abnormal   Collection Time: 10/02/23 12:02 AM  Result Value Ref Range Status   Enterococcus faecalis  NOT DETECTED NOT DETECTED Final   Enterococcus Faecium NOT DETECTED NOT DETECTED Final   Listeria monocytogenes NOT DETECTED NOT DETECTED Final   Staphylococcus species NOT DETECTED NOT DETECTED Final   Staphylococcus aureus (BCID) NOT DETECTED NOT DETECTED Final   Staphylococcus epidermidis NOT DETECTED NOT DETECTED Final   Staphylococcus lugdunensis NOT DETECTED NOT DETECTED Final   Streptococcus species NOT DETECTED NOT DETECTED Final   Streptococcus agalactiae NOT DETECTED NOT DETECTED Final   Streptococcus pneumoniae NOT DETECTED NOT DETECTED Final  Streptococcus pyogenes NOT DETECTED NOT DETECTED Final   A.calcoaceticus-baumannii NOT DETECTED NOT DETECTED Final   Bacteroides fragilis NOT DETECTED NOT DETECTED Final   Enterobacterales DETECTED (A) NOT DETECTED Final    Comment: Enterobacterales represent a large order of gram negative bacteria, not a single organism. CRITICAL RESULT CALLED TO, READ BACK BY AND VERIFIED WITH: PHARMD G ABBOTT 10/03/23 @ 0423 BY AB    Enterobacter cloacae complex NOT DETECTED NOT DETECTED Final   Escherichia coli DETECTED (A) NOT DETECTED Final    Comment: CRITICAL RESULT CALLED TO, READ BACK BY AND VERIFIED WITH: PHARMD G ABBOTT 10/03/23 @ 0423 BY AB    Klebsiella aerogenes NOT DETECTED NOT DETECTED Final   Klebsiella oxytoca NOT DETECTED NOT DETECTED Final   Klebsiella pneumoniae NOT DETECTED NOT DETECTED Final   Proteus species NOT DETECTED NOT DETECTED Final   Salmonella species NOT DETECTED NOT DETECTED Final   Serratia marcescens NOT DETECTED NOT DETECTED Final   Haemophilus influenzae NOT DETECTED NOT DETECTED Final   Neisseria meningitidis NOT DETECTED NOT DETECTED Final   Pseudomonas aeruginosa NOT DETECTED NOT DETECTED Final   Stenotrophomonas maltophilia NOT DETECTED NOT DETECTED Final   Candida albicans NOT DETECTED NOT DETECTED Final   Candida auris NOT DETECTED NOT DETECTED Final   Candida glabrata NOT DETECTED NOT DETECTED Final    Candida krusei NOT DETECTED NOT DETECTED Final   Candida parapsilosis NOT DETECTED NOT DETECTED Final   Candida tropicalis NOT DETECTED NOT DETECTED Final   Cryptococcus neoformans/gattii NOT DETECTED NOT DETECTED Final   CTX-M ESBL DETECTED (A) NOT DETECTED Final    Comment: CRITICAL RESULT CALLED TO, READ BACK BY AND VERIFIED WITH: PHARMD G ABBOTT 10/03/23 @ 0423 BY AB (NOTE) Extended spectrum beta-lactamase detected. Recommend a carbapenem as initial therapy.      Carbapenem resistance IMP NOT DETECTED NOT DETECTED Final   Carbapenem resistance KPC NOT DETECTED NOT DETECTED Final   Carbapenem resistance NDM NOT DETECTED NOT DETECTED Final   Carbapenem resist OXA 48 LIKE NOT DETECTED NOT DETECTED Final   Carbapenem resistance VIM NOT DETECTED NOT DETECTED Final    Comment: Performed at [facility], [address] [place], [place] [address]    Radiology Studies: No results found.    Margaree Sandhu T. Djeneba Barsch [facility]  If 7PM-7AM, please contact night-coverage [email or web address] 10/04/2023, 1:36 PM

## 2023-10-04 NOTE — Plan of Care (Signed)
  Problem: Safety: Goal: Ability to remain free from injury will improve Outcome: Progressing   Problem: Skin Integrity: Goal: Risk for impaired skin integrity will decrease Outcome: Progressing   

## 2023-10-04 NOTE — Progress Notes (Signed)
Transition of Care [facility]) - Inpatient Brief Assessment   Patient Details  Name: Betty [name] MRN: [id number] Date of Birth: 12-15-1945  Transition of Care [facility]) CM/SW Contact:    Janae Bridgeman, RN Phone Number: 10/04/2023, 2:37 PM   Clinical Narrative: CM called and spoke with the patient's son, Julicia Seo, by phone and offered Medicare choice regarding Inpatient Hospice Placement and the son chose [facility] facility.  I called and spoke with Efraim Kaufmann, CM with [facility] and placed referral for Inpatient Hospice placement.  Attending MD is aware.   Transition of Care Asessment: Insurance and Status: (P) Insurance coverage has been reviewed Patient has primary care physician: (P) Yes Home environment has been reviewed: (P) From [facility] Prior level of function:: (P) Nursing home care at [facility]: (P) Current home services Social Determinants of Health Reivew: (P) SDOH reviewed interventions complete Readmission risk has been reviewed: (P) Yes Transition of care needs: (P) transition of care needs identified, TOC will continue to follow

## 2023-10-05 DIAGNOSIS — E870 Hyperosmolality and hypernatremia: Secondary | ICD-10-CM | POA: Diagnosis not present

## 2023-10-05 DIAGNOSIS — N179 Acute kidney failure, unspecified: Secondary | ICD-10-CM | POA: Diagnosis not present

## 2023-10-05 DIAGNOSIS — Z515 Encounter for palliative care: Secondary | ICD-10-CM | POA: Diagnosis not present

## 2023-10-05 DIAGNOSIS — I10 Essential (primary) hypertension: Secondary | ICD-10-CM | POA: Diagnosis not present

## 2023-10-05 DIAGNOSIS — F0150 Vascular dementia without behavioral disturbance: Secondary | ICD-10-CM | POA: Diagnosis not present

## 2023-10-05 LAB — CULTURE, BLOOD (ROUTINE X 2): Special Requests: ADEQUATE

## 2023-10-05 NOTE — Plan of Care (Signed)
  Problem: Education: Goal: Knowledge of General Education information will improve Description: Including pain rating scale, medication(s)/side effects and non-pharmacologic comfort measures Outcome: Progressing   Problem: Health Behavior/Discharge Planning: Goal: Ability to manage health-related needs will improve Outcome: Progressing   Problem: Clinical Measurements: Goal: Ability to maintain clinical measurements within normal limits will improve Outcome: Progressing Goal: Will remain free from infection Outcome: Progressing Goal: Diagnostic test results will improve Outcome: Progressing Goal: Respiratory complications will improve Outcome: Progressing Goal: Cardiovascular complication will be avoided Outcome: Progressing   Problem: Activity: Goal: Risk for activity intolerance will decrease Outcome: Progressing   Problem: Nutrition: Goal: Adequate nutrition will be maintained Outcome: Progressing   Problem: Coping: Goal: Level of anxiety will decrease Outcome: Progressing   Problem: Elimination: Goal: Will not experience complications related to urinary retention Outcome: Progressing   Problem: Pain Management: Goal: General experience of comfort will improve Outcome: Progressing   Problem: Safety: Goal: Ability to remain free from injury will improve Outcome: Progressing   Problem: Skin Integrity: Goal: Risk for impaired skin integrity will decrease Outcome: Progressing   Problem: Education: Goal: Knowledge of the prescribed therapeutic regimen will improve Outcome: Progressing   Problem: Coping: Goal: Ability to identify and develop effective coping behavior will improve Outcome: Progressing   Problem: Clinical Measurements: Goal: Quality of life will improve Outcome: Progressing   Problem: Respiratory: Goal: Verbalizations of increased ease of respirations will increase Outcome: Progressing   Problem: Role Relationship: Goal: Family's ability  to cope with current situation will improve Outcome: Progressing Goal: Ability to verbalize concerns, feelings, and thoughts to partner or family member will improve Outcome: Progressing   Problem: Pain Management: Goal: Satisfaction with pain management regimen will improve Outcome: Progressing

## 2023-10-05 NOTE — Progress Notes (Signed)
PROGRESS NOTE  Betty [name] WUJ:[id number] DOB: January 07, 1946   PCP: Collene Mares, PA  Patient is from: SNF  DOA: 10/01/2023 LOS: 3  Chief complaints Chief Complaint  Patient presents with   Altered Mental Status     Brief Narrative / Interim history: 77 year old F with PMH of dementia, COPD, CKD-4, CAD, HFrEF and lung cancer sent to ED from SNF with altered mental status and abnormal labs including severe hyponatremia and AKI.  Reportedly has not been eating or drinking in several days, and found on the floor of a facility per report of his son.  In ED, slightly tachycardic but normotensive.  Afebrile.  Labs are most notable for sodium 165, BUN 152, creatinine 8.03, WBC 14,500, and normal lactic acid.  No acute findings are noted on head CT or cervical spine CT.  No hydronephrosis is seen on CT of the abdomen and pelvis.  Received 1 L of LR bolus and started on IV dextrose, IV ceftriaxone, and admitted.  Blood culture with ESBL E. coli.  Antibiotic escalated to IV meropenem.  Hypernatremia and AKI improved with IV dextrose. Dextrose paused due to faster correction.  Persistent encephalopathy with overall decline despite improvement in numbers and appropriate antibiotics.  Palliative medicine consulted and following.  Patient has been transitioned to full comfort care on 10/04/2023 after discussion with patient's son, [name] over the phone.  Waiting on residential hospice bed at [facility].  Subjective: Seen and examined earlier this morning.  No major events overnight of this morning.  Sleeping comfortably.  Objective: Vitals:   10/04/23 0411 10/04/23 0821 10/04/23 2005 10/05/23 1026  BP: 124/79 125/75 127/83 137/83  Pulse: (!) 106 (!) 105 99 (!) 106  Resp: 18 16 17 17   Temp:  97.7 F (36.5 C) (!) 97.5 F (36.4 C)   TempSrc:   Oral   SpO2: 100% 100% 98% 100%  Weight:      Height:        Examination:  GENERAL: Appears frail.  No apparent distress. RESP:  No IWOB.  On  room air. MSK/EXT:  No apparent deformity. No edema.  NEURO: Sleeping.  No apparent focal neurodeficit.  Procedures:  None  Microbiology summarized: Blood cultures with ESBL E. coli. Urine culture with multiple species.  Assessment and plan: End-of-life care/full comfort care-patient declined despite aggressive intervention for underlying illness.  Transition to full comfort care after discussion with patient's son, [name] over the phone -Comfort pathway with as needed meds-discussed this with patient's son -Approved for [facility] pending bed availability.  Acute renal failure superimposed on CKD IV/uremia: Baseline Cr 3.0-3.1.  Likely prerenal from poor p.o. intake in the setting of severe dementia.  No hydronephrosis or obstructive etiology on CT.   Hypernatremia: Likely due to dehydration/poor p.o. intake.  ESBL E. coli bacteremia with possible urinary tract infection: UA concerning for UTI.  Blood culture with ESBL E. coli.  Urine culture with multiple species.  Initially on IV ceftriaxone and transitioned to IV meropenem   Acute metabolic encephalopathy-multifactorial including uremia, hypernatremia, bacteremia, dehydration and underlying dementia.  CT head without acute finding.  Remains encephalopathic despite appropriate treatment.  Chronic HFrEF: TTE in 11/2022 with LVEF of 30 to 35%.  Dehydrated  Chronic COPD: Stable   History of lung cancer: S/p right upper lobectomy and chemo in 2016.   Failure to thrive: Patient with severe dementia complicated by advanced disease process as above.  Poor prognosis.  Now full comfort care.  Hypokalemia  Leukocytosis/bandemia:  Likely due to infection  Severe malnutrition/failure to thrive Body mass index is 17.71 kg/m.          DVT prophylaxis:  Patient is full comfort care.  Code Status: DNR/DNI Family Communication: None at bedside. Level of care: Palliative Care Status is: Inpatient Remains inpatient appropriate  because: Residential hospice bed   Final disposition: Residential hospice Consultants:  Palliative medicine  35 minutes with more than 50% spent in reviewing records, counseling patient/family and coordinating care.   Sch Meds:  Scheduled Meds:   Continuous Infusions:   PRN Meds:.acetaminophen **OR** acetaminophen, antiseptic oral rinse, glycopyrrolate **OR** glycopyrrolate **OR** glycopyrrolate, haloperidol **OR** haloperidol **OR** haloperidol lactate, HYDROmorphone (DILAUDID) injection, LORazepam **OR** LORazepam **OR** LORazepam, ondansetron **OR** ondansetron (ZOFRAN) IV, polyvinyl alcohol  Antimicrobials: Anti-infectives (From admission, onward)    Start     Dose/Rate Route Frequency Ordered Stop   10/04/23 1000  meropenem (MERREM) 500 mg in sodium chloride 0.9 % 100 mL IVPB  Status:  Discontinued        500 mg 200 mL/hr over 30 Minutes Intravenous Daily 10/03/23 0503 10/04/23 1300   10/03/23 0600  meropenem (MERREM) 1 g in sodium chloride 0.9 % 100 mL IVPB        1 g 200 mL/hr over 30 Minutes Intravenous  Once 10/03/23 0503 10/03/23 0632   10/02/23 2300  cefTRIAXone (ROCEPHIN) 1 g in sodium chloride 0.9 % 100 mL IVPB  Status:  Discontinued        1 g 200 mL/hr over 30 Minutes Intravenous Every 24 hours 10/02/23 0412 10/03/23 0503   10/01/23 2230  cefTRIAXone (ROCEPHIN) 1 g in sodium chloride 0.9 % 100 mL IVPB        1 g 200 mL/hr over 30 Minutes Intravenous  Once 10/01/23 2222 10/02/23 0213        I have personally reviewed the following labs and images: CBC: Recent Labs  Lab 10/01/23 2247 10/02/23 0731 10/03/23 0423 10/04/23 0551  WBC 14.5* 19.7* 19.0* 14.6*  HGB 13.2 11.3* 11.6* 11.9*  HCT 47.4* 35.2* 37.4 37.8  MCV 100.9* 90.5 90.3 90.2  PLT 291 263 260 276   BMP &GFR Recent Labs  Lab 10/02/23 1006 10/02/23 1552 10/02/23 2114 10/03/23 0423 10/04/23 0551  NA 164* 161* 157* 151* 157*  K 4.3 4.8 3.8 3.4* 4.3  CL 125* 125* 121* 119* 124*  CO2 20*  19* 21* 20* 19*  GLUCOSE 160* 159* 182* 180* 101*  BUN 138* 132* 122* 108* 99*  CREATININE 7.35* 6.90* 6.36* 6.01* 5.45*  CALCIUM 8.8* 8.7* 8.9 8.4* 9.3  MG  --   --   --   --  2.6*   Estimated Creatinine Clearance: 6.3 mL/min (A) (by C-G formula based on SCr of 5.45 mg/dL (H)). Liver & Pancreas: Recent Labs  Lab 10/02/23 0004 10/04/23 0551  AST 19 19  ALT 11 11  ALKPHOS 89 76  BILITOT 0.8 0.5  PROT 8.6* 7.6  ALBUMIN 3.0* 2.5*   No results for input(s): "LIPASE", "AMYLASE" in the last 168 hours. No results for input(s): "AMMONIA" in the last 168 hours. Diabetic: No results for input(s): "HGBA1C" in the last 72 hours. No results for input(s): "GLUCAP" in the last 168 hours. Cardiac Enzymes: No results for input(s): "CKTOTAL", "CKMB", "CKMBINDEX", "TROPONINI" in the last 168 hours. No results for input(s): "PROBNP" in the last 8760 hours. Coagulation Profile: No results for input(s): "INR", "PROTIME" in the last 168 hours. Thyroid Function Tests: No results for input(s): "TSH", "  T4TOTAL", "FREET4", "T3FREE", "THYROIDAB" in the last 72 hours. Lipid Profile: No results for input(s): "CHOL", "HDL", "LDLCALC", "TRIG", "CHOLHDL", "LDLDIRECT" in the last 72 hours. Anemia Panel: No results for input(s): "VITAMINB12", "FOLATE", "FERRITIN", "TIBC", "IRON", "RETICCTPCT" in the last 72 hours. Urine analysis:    Component Value Date/Time   COLORURINE YELLOW 10/01/2023 2231   APPEARANCEUR TURBID (A) 10/01/2023 2231   LABSPEC 1.013 10/01/2023 2231   PHURINE 5.0 10/01/2023 2231   GLUCOSEU NEGATIVE 10/01/2023 2231   HGBUR MODERATE (A) 10/01/2023 2231   BILIRUBINUR NEGATIVE 10/01/2023 2231   KETONESUR NEGATIVE 10/01/2023 2231   PROTEINUR 100 (A) 10/01/2023 2231   UROBILINOGEN 0.2 04/27/2015 0834   NITRITE NEGATIVE 10/01/2023 2231   LEUKOCYTESUR MODERATE (A) 10/01/2023 2231   Sepsis Labs: Invalid input(s): "PROCALCITONIN", "LACTICIDVEN"  Microbiology: Recent Results (from the past  240 hour(s))  Urine Culture     Status: Abnormal   Collection Time: 10/01/23 10:31 PM   Specimen: Urine, Random  Result Value Ref Range Status   Specimen Description URINE, RANDOM  Final   Special Requests   Final    NONE Reflexed from [id number] Performed at [facility], [address] [place], [place] [address]    Culture MULTIPLE SPECIES PRESENT, SUGGEST RECOLLECTION (A)  Final   Report Status 10/02/2023 FINAL  Final  Blood culture (routine x 2)     Status: None (Preliminary result)   Collection Time: 10/01/23 11:21 PM   Specimen: BLOOD RIGHT HAND  Result Value Ref Range Status   Specimen Description BLOOD RIGHT HAND  Final   Special Requests   Final    BOTTLES DRAWN AEROBIC AND ANAEROBIC Blood Culture results may not be optimal due to an inadequate volume of blood received in culture bottles   Culture   Final    NO GROWTH 4 DAYS Performed at [facility], [address], [place], [place] [address]    Report Status PENDING  Incomplete  Blood culture (routine x 2)     Status: Abnormal   Collection Time: 10/02/23 12:02 AM   Specimen: BLOOD RIGHT HAND  Result Value Ref Range Status   Specimen Description BLOOD RIGHT HAND  Final   Special Requests   Final    BOTTLES DRAWN AEROBIC AND ANAEROBIC Blood Culture adequate volume   Culture  Setup Time   Final    GRAM NEGATIVE RODS AEROBIC BOTTLE ONLY CRITICAL RESULT CALLED TO, READ BACK BY AND VERIFIED WITH: PHARMD G ABBOTT 10/03/23 @ 0423 BY AB Performed at [facility], [address] [place], [place] [address]    Culture (A)  Final    ESCHERICHIA COLI Confirmed Extended Spectrum Beta-Lactamase Producer (ESBL).  In bloodstream infections from ESBL organisms, carbapenems are preferred over piperacillin/tazobactam. They are shown to have a lower risk of mortality.    Report Status 10/05/2023 FINAL  Final   Organism ID, Bacteria ESCHERICHIA COLI  Final   Organism ID, Bacteria ESCHERICHIA COLI  Final       Susceptibility   Escherichia coli - KIRBY BAUER*    CEFAZOLIN RESISTANT Resistant    Escherichia coli - MIC*    AMPICILLIN >=32 RESISTANT Resistant     CEFEPIME >=32 RESISTANT Resistant     CEFTAZIDIME >=64 RESISTANT Resistant     CEFTRIAXONE >=64 RESISTANT Resistant     CIPROFLOXACIN >=4 RESISTANT Resistant     GENTAMICIN <=1 SENSITIVE Sensitive     IMIPENEM <=0.25 SENSITIVE Sensitive     TRIMETH/SULFA >=320 RESISTANT  Resistant     AMPICILLIN/SULBACTAM >=32 RESISTANT Resistant     PIP/TAZO 16 SENSITIVE Sensitive ug/mL    * ESCHERICHIA COLI    ESCHERICHIA COLI  Blood Culture ID Panel (Reflexed)     Status: Abnormal   Collection Time: 10/02/23 12:02 AM  Result Value Ref Range Status   Enterococcus faecalis NOT DETECTED NOT DETECTED Final   Enterococcus Faecium NOT DETECTED NOT DETECTED Final   Listeria monocytogenes NOT DETECTED NOT DETECTED Final   Staphylococcus species NOT DETECTED NOT DETECTED Final   Staphylococcus aureus (BCID) NOT DETECTED NOT DETECTED Final   Staphylococcus epidermidis NOT DETECTED NOT DETECTED Final   Staphylococcus lugdunensis NOT DETECTED NOT DETECTED Final   Streptococcus species NOT DETECTED NOT DETECTED Final   Streptococcus agalactiae NOT DETECTED NOT DETECTED Final   Streptococcus pneumoniae NOT DETECTED NOT DETECTED Final   Streptococcus pyogenes NOT DETECTED NOT DETECTED Final   A.calcoaceticus-baumannii NOT DETECTED NOT DETECTED Final   Bacteroides fragilis NOT DETECTED NOT DETECTED Final   Enterobacterales DETECTED (A) NOT DETECTED Final    Comment: Enterobacterales represent a large order of gram negative bacteria, not a single organism. CRITICAL RESULT CALLED TO, READ BACK BY AND VERIFIED WITH: PHARMD G ABBOTT 10/03/23 @ 0423 BY AB    Enterobacter cloacae complex NOT DETECTED NOT DETECTED Final   Escherichia coli DETECTED (A) NOT DETECTED Final    Comment: CRITICAL RESULT CALLED TO, READ BACK BY AND VERIFIED WITH: PHARMD G ABBOTT 10/03/23  @ 0423 BY AB    Klebsiella aerogenes NOT DETECTED NOT DETECTED Final   Klebsiella oxytoca NOT DETECTED NOT DETECTED Final   Klebsiella pneumoniae NOT DETECTED NOT DETECTED Final   Proteus species NOT DETECTED NOT DETECTED Final   Salmonella species NOT DETECTED NOT DETECTED Final   Serratia marcescens NOT DETECTED NOT DETECTED Final   Haemophilus influenzae NOT DETECTED NOT DETECTED Final   Neisseria meningitidis NOT DETECTED NOT DETECTED Final   Pseudomonas aeruginosa NOT DETECTED NOT DETECTED Final   Stenotrophomonas maltophilia NOT DETECTED NOT DETECTED Final   Candida albicans NOT DETECTED NOT DETECTED Final   Candida auris NOT DETECTED NOT DETECTED Final   Candida glabrata NOT DETECTED NOT DETECTED Final   Candida krusei NOT DETECTED NOT DETECTED Final   Candida parapsilosis NOT DETECTED NOT DETECTED Final   Candida tropicalis NOT DETECTED NOT DETECTED Final   Cryptococcus neoformans/gattii NOT DETECTED NOT DETECTED Final   CTX-M ESBL DETECTED (A) NOT DETECTED Final    Comment: CRITICAL RESULT CALLED TO, READ BACK BY AND VERIFIED WITH: PHARMD G ABBOTT 10/03/23 @ 0423 BY AB (NOTE) Extended spectrum beta-lactamase detected. Recommend a carbapenem as initial therapy.      Carbapenem resistance IMP NOT DETECTED NOT DETECTED Final   Carbapenem resistance KPC NOT DETECTED NOT DETECTED Final   Carbapenem resistance NDM NOT DETECTED NOT DETECTED Final   Carbapenem resist OXA 48 LIKE NOT DETECTED NOT DETECTED Final   Carbapenem resistance VIM NOT DETECTED NOT DETECTED Final    Comment: Performed at [facility], [address], [place], [place] [address]    Radiology Studies: No results found.    Monika Chestang T. Bralon Antkowiak [facility]  If 7PM-7AM, please contact night-coverage [email or web address] 10/05/2023, 1:45 PM

## 2023-10-05 NOTE — Progress Notes (Signed)
Daily Progress Note   Patient Name: Betty [name]       Date: 10/05/2023 DOB: Dec 06, 1945  Age: 77 y.o. MRN#: [id number] Attending Physician: Almon Hercules, MD Primary Care Physician: Collene Mares, [place] Admit Date: 10/01/2023  Reason for Consultation/Follow-up: Establishing goals of care  Length of Stay: 3  Current Medications: Scheduled Meds:    Continuous Infusions:   PRN Meds: acetaminophen **OR** acetaminophen, antiseptic oral rinse, glycopyrrolate **OR** glycopyrrolate **OR** glycopyrrolate, haloperidol **OR** haloperidol **OR** haloperidol lactate, HYDROmorphone (DILAUDID) injection, LORazepam **OR** LORazepam **OR** LORazepam, ondansetron **OR** ondansetron (ZOFRAN) IV, polyvinyl alcohol  Physical Exam Constitutional:      General: She is sleeping.     Appearance: She is ill-appearing.  Neurological:     Mental Status: She is easily aroused.             Vital Signs: BP 127/83 (BP Location: Left Arm)   Pulse 99   Temp (!) 97.5 F (36.4 C) (Oral)   Resp 17   Ht 5\' 4"  (1.626 m)   Wt 46.8 kg   SpO2 98%   BMI 17.71 kg/m  SpO2: SpO2: 98 % O2 Device: O2 Device: Room Air O2 Flow Rate:      Palliative Assessment/Data: 10 - 20%      Patient Active Problem List   Diagnosis Date Noted   End of life care 10/04/2023   Acute renal failure superimposed on stage 4 chronic kidney disease (HCC) 10/02/2023   Hypernatremia 10/02/2023   UTI (urinary tract infection) 10/02/2023   Acute encephalopathy 10/02/2023   COPD (chronic obstructive pulmonary disease) (HCC)    Fall at home, initial encounter 07/29/2023   Community acquired pneumonia of left lower lobe of lung 07/29/2023   Fall 07/29/2023   Pneumonia 07/28/2023   Perforated appendicitis 06/25/2023   Acute  metabolic encephalopathy 06/04/2023   Abdominal pain 06/04/2023   Abdominal mass 06/04/2023   Allergic rhinitis 12/14/2021   Bilateral pleural effusion 12/14/2021   Cardiac pacemaker in situ 12/14/2021   Coronary artery disease 12/14/2021   Esophagitis, unspecified with bleeding 12/14/2021   Gastro-esophageal reflux disease without esophagitis 12/14/2021   Gout 12/14/2021   Hardening of the aorta (main artery of the heart) (HCC) 12/14/2021   Left knee pain 12/14/2021   Malignant neoplasm of lower lobe, unspecified bronchus or lung (  HCC) 12/14/2021   Mixed hyperlipidemia 12/14/2021   Multi-infarct dementia, uncomplicated (HCC) 12/14/2021   History of colonic polyps 12/14/2021   Primary localized osteoarthritis of pelvic region and thigh 12/14/2021   Tobacco user 12/14/2021   Vitamin D deficiency 12/14/2021   CKD (chronic kidney disease) stage 5, GFR less than 15 ml/min (HCC) 11/14/2021   GI bleed 06/24/2019   Hypotension 06/24/2019   Electrolyte abnormality 06/24/2019   Pressure injury of skin 05/29/2019   Altered mental status    Acute respiratory failure with hypoxemia (HCC)    Essential hypertension 05/14/2019   Dementia (HCC) 07/16/2018   Cerebral aneurysm, nonruptured 07/16/2018   Nonischemic cardiomyopathy (HCC) 05/21/2018   Congestive heart failure, NYHA class 3, chronic, systolic (HCC) 05/21/2018   Chronic systolic heart failure (HCC)    Chest pain 04/22/2018   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   NSTEMI (non-ST elevated myocardial infarction) (HCC) 03/24/2018   Pure hypercholesterolemia    CKD (chronic kidney disease), stage IV (HCC)    Non-small cell carcinoma of right lung, stage 1 (HCC) 05/17/2015   S/P lobectomy of lung 04/29/2015    Palliative Care Assessment & Plan   Patient Profile: 77 y.o. female  with past medical history of dementia, COPD, HFrEF, CKD stage IV, CAD, and lung cancer who presented to the ED from SNF on 10/01/2023 with  altered mental status and abnormal labs including severe and hyponatremia and AKI.  No acute findings noted on head CT or cervical spine CT.  No hydronephrosis is seen on CT of the abdomen and pelvis.  Patient is admitted with acute renal failure superimposed on CKD 4, hypernatremia likely due to dehydration and poor p.o. intake, and probable UTI.  Today's Discussion Called patient's son [name] to give him an update. Patient resting in bed appears comfortable. She did not require PRN medication overnight. We discussed the plan for evaluation for [facility]. Discussed the philosophy of hospice. Reviewed available comfort medications. Emotional support provided. Encouraged [name] to call PMT with questions or concerns.  Recommendations/Plan: Full comfort care Referral to [facility] by [facility] Continued PMT support  Goals of Care and Additional Recommendations: Limitations on Scope of Treatment: Full Comfort Care  Code Status:    Code Status Orders  (From admission, onward)           Start     Ordered   10/04/23 1301  Do not attempt resuscitation (DNR) - Comfort care  Continuous       Question Answer Comment  If patient has no pulse and is not breathing Do Not Attempt Resuscitation   In Pre-Arrest Conditions (Patient Is Breathing and Has a Pulse) Provide comfort measures. Relieve any mechanical airway obstruction. Avoid transfer unless required for comfort.   Consent: Discussion documented in EHR or advanced directives reviewed      10/04/23 1301         Extensive chart review has been completed prior to seeing the patient including vital signs, progress/consult notes, orders, medications, and available advance directive documents.  Prognosis:  < 2 weeks  Discharge Planning: To Be Determined  Time spent: 25 minutes  Thank you for allowing the Palliative Medicine Team to assist in the care of this patient.     Sherryll Burger, NP  Please contact Palliative Medicine  Team phone at [phone] for questions and concerns.

## 2023-10-05 NOTE — Progress Notes (Signed)
[facility] Liaison Note  Referral received for patient/family interest in [facility]. Chart under review by [facility] physician.   Patient has been approved for [facility] at routine level of care.   Unfortunately, [facility] is unable to offer a bed today. Hospital liaison will continue to assess and offer a bed once one becomes available.   Please call with any questions or concerns. Thank you  Dionicio Stall, LCSW [facility] liaison [phone]

## 2023-10-06 DIAGNOSIS — J449 Chronic obstructive pulmonary disease, unspecified: Secondary | ICD-10-CM | POA: Diagnosis not present

## 2023-10-06 DIAGNOSIS — N179 Acute kidney failure, unspecified: Secondary | ICD-10-CM | POA: Diagnosis not present

## 2023-10-06 DIAGNOSIS — Z515 Encounter for palliative care: Secondary | ICD-10-CM | POA: Diagnosis not present

## 2023-10-06 DIAGNOSIS — I2510 Atherosclerotic heart disease of native coronary artery without angina pectoris: Secondary | ICD-10-CM | POA: Diagnosis not present

## 2023-10-06 DIAGNOSIS — I5022 Chronic systolic (congestive) heart failure: Secondary | ICD-10-CM | POA: Diagnosis not present

## 2023-10-06 LAB — CULTURE, BLOOD (ROUTINE X 2): Culture: NO GROWTH

## 2023-10-06 MED ORDER — GLYCOPYRROLATE 0.2 MG/ML IJ SOLN: 0.2000 mg | INTRAMUSCULAR | Status: DC | PRN

## 2023-10-06 MED ORDER — LORAZEPAM 2 MG/ML PO CONC: 1.0000 mg | ORAL | Status: DC | PRN

## 2023-10-06 MED ORDER — HALOPERIDOL LACTATE 2 MG/ML PO CONC: 0.5000 mg | ORAL | Status: DC | PRN

## 2023-10-06 MED ORDER — ONDANSETRON 4 MG PO TBDP: 4.0000 mg | ORAL_TABLET | Freq: Four times a day (QID) | ORAL | Status: DC | PRN

## 2023-10-06 MED ORDER — ACETAMINOPHEN 650 MG RE SUPP: 650.0000 mg | Freq: Four times a day (QID) | RECTAL | Status: DC | PRN

## 2023-10-06 MED ORDER — HYDROMORPHONE HCL 1 MG/ML IJ SOLN: 0.5000 mg | INTRAMUSCULAR | Status: DC | PRN

## 2023-10-06 NOTE — Discharge Summary (Signed)
Physician Discharge Summary  Betty [name]:[id number] DOB: July 03, 1946 DOA: 10/01/2023  PCP: Collene Mares, PA  Admit date: 10/01/2023 Discharge date: 10/06/2023 Admitted From: SNF Disposition: Residential hospice, [facility]   Discharge Condition: Stable for transfer CODE STATUS: DNR/DNI  Contact information for after-discharge care     Destination     [facility] Preferred SNF .   Service: Skilled Nursing Contact information: [address] [place] [address] [phone]                     Hospital course 77 year old F with PMH of dementia, COPD, CKD-4, CAD, HFrEF and lung cancer sent to ED from SNF with altered mental status and abnormal labs including severe hyponatremia and AKI.  Reportedly has not been eating or drinking in several days, and found on the floor of a facility per report of his son.   In ED, slightly tachycardic but normotensive.  Afebrile.  Labs are most notable for sodium 165, BUN 152, creatinine 8.03, WBC 14,500, and normal lactic acid.  No acute findings are noted on head CT or cervical spine CT.  No hydronephrosis is seen on CT of the abdomen and pelvis.  Received 1 L of LR bolus and started on IV dextrose, IV ceftriaxone, and admitted.   Blood culture with ESBL E. coli.  Antibiotic escalated to IV meropenem.  Hypernatremia and AKI improved with IV dextrose. Dextrose paused due to faster correction.  Persistent encephalopathy with overall decline despite improvement in numbers and appropriate antibiotics.  Palliative medicine consulted and following.  Patient has been transitioned to full comfort care on 10/04/2023 after discussion with patient's son, [name] over the phone.  Patient is discharged to [facility] for end-of-life care. See individual problem list below for more.   Problems addressed during this hospitalization End-of-life care/full comfort care-patient declined despite aggressive intervention  for underlying illness.  Transition to full comfort care after discussion with patient's son, [name] over the phone -Comfort pathway with as needed meds- -Transfer to [facility] for end-of-life care    Acute renal failure superimposed on CKD IV/uremia: Baseline Cr 3.0-3.1.  Likely prerenal from poor p.o. intake in the setting of severe dementia.  No hydronephrosis or obstructive etiology on CT.    Hypernatremia: Likely due to dehydration/poor p.o. intake.   ESBL E. coli bacteremia with possible urinary tract infection: UA concerning for UTI.  Blood culture with ESBL E. coli.  Urine culture with multiple species.  Initially on IV ceftriaxone and transitioned to IV meropenem   Acute metabolic encephalopathy-multifactorial including uremia, hypernatremia, bacteremia, dehydration and underlying dementia.  CT head without acute finding.  Remains encephalopathic despite appropriate treatment.   Chronic HFrEF: TTE in 11/2022 with LVEF of 30 to 35%.  Dehydrated   Chronic COPD: Stable   History of lung cancer: S/p right upper lobectomy and chemo in 2016.    Failure to thrive: Patient with severe dementia complicated by advanced disease process as above.  Poor prognosis.  Now full comfort care.   Hypokalemia   Leukocytosis/bandemia: Likely due to infection   Severe malnutrition/failure to thrive            Time spent 35 minutes  Vital signs Vitals:   10/04/23 2005 10/05/23 1026 10/05/23 1956 10/06/23 1336  BP: 127/83 137/83 (!) 147/96 (!) 82/48  Pulse: 99 (!) 106 (!) 128 (!) 123  Temp: (!) 97.5 F (36.4 C)  97.7 F (36.5 C)   Resp:  17 17 16 17   Height:      Weight:      SpO2: 98% 100% 100% 97%  TempSrc: Oral  Oral   BMI (Calculated):         Discharge exam  GENERAL: Appears frail.  No apparent distress. RESP:  No IWOB.  On room air. MSK/EXT:  No apparent deformity. No edema.  NEURO: Sleeping.  No apparent focal neurodeficit.  Discharge Instructions  Allergies as of  10/06/2023       Reactions   Sulfa Antibiotics Itching        Medication List     STOP taking these medications    acetaminophen 325 MG tablet Commonly known as: TYLENOL Replaced by: acetaminophen 650 MG suppository   aspirin EC 81 MG tablet   atorvastatin 40 MG tablet Commonly known as: LIPITOR   cetirizine 10 MG tablet Commonly known as: ZYRTEC   cholecalciferol 25 MCG (1000 UNIT) tablet Commonly known as: VITAMIN D3   donepezil 10 MG tablet Commonly known as: ARICEPT   febuxostat 40 MG tablet Commonly known as: ULORIC   isosorbide dinitrate 20 MG tablet Commonly known as: ISORDIL   memantine 10 MG tablet Commonly known as: NAMENDA   metoprolol succinate 50 MG 24 hr tablet Commonly known as: TOPROL-XL   mirtazapine 7.5 MG tablet Commonly known as: REMERON   multivitamin with minerals Tabs tablet   nitroGLYCERIN 0.4 MG SL tablet Commonly known as: NITROSTAT   NUTRITIONAL SUPPLEMENTS PO   pantoprazole 40 MG tablet Commonly known as: PROTONIX   QUEtiapine 25 MG tablet Commonly known as: SEROQUEL   sodium bicarbonate 650 MG tablet       TAKE these medications    acetaminophen 650 MG suppository Commonly known as: TYLENOL Place 1 suppository (650 mg total) rectally every 6 (six) hours as needed for mild pain (pain score 1-3) (or Fever >/= 101). Replaces: acetaminophen 325 MG tablet   glycopyrrolate 0.2 MG/ML injection Commonly known as: ROBINUL Inject 1 mL (0.2 mg total) into the skin every 4 (four) hours as needed (excessive secretions).   haloperidol 2 MG/ML solution Commonly known as: HALDOL Place 0.3 mLs (0.6 mg total) under the tongue every 4 (four) hours as needed for agitation (or delirium).   HYDROmorphone 1 MG/ML injection Commonly known as: DILAUDID Inject 0.5-2 mLs (0.5-2 mg total) into the vein every 30 (thirty) minutes as needed for severe pain (pain score 7-10) (To alleviate signs and symptoms of distress).   LORazepam 2  MG/ML concentrated solution Commonly known as: ATIVAN Place 0.5 mLs (1 mg total) under the tongue every 4 (four) hours as needed for anxiety.   ondansetron 4 MG disintegrating tablet Commonly known as: ZOFRAN-ODT Take 1 tablet (4 mg total) by mouth every 6 (six) hours as needed for nausea.        Consultations: Palliative medicine  Procedures/Studies:   CT Head Wo Contrast  Result Date: 10/02/2023 CLINICAL DATA:  Mental status changes, unknown cause, neck trauma. EXAM: CT HEAD WITHOUT CONTRAST CT CERVICAL SPINE WITHOUT CONTRAST TECHNIQUE: Multidetector CT imaging of the head and cervical spine was performed following the standard protocol without intravenous contrast. Multiplanar CT image reconstructions of the cervical spine were also generated. RADIATION DOSE REDUCTION: This exam was performed according to the departmental dose-optimization program which includes automated exposure control, adjustment of the mA and/or kV according to patient size and/or use of iterative reconstruction technique. COMPARISON:  CT scan head and cervical spine both 07/28/2023. FINDINGS: CT HEAD FINDINGS Brain:  There is moderately advanced cerebral and mild cerebellar atrophy, moderate atrophic ventriculomegaly and advanced small vessel disease of the cerebral white matter. There are bilateral small chronic gangliocapsular lacunar infarcts. No new infarct is suspected, no hemorrhage, mass or mass effect and no midline shift. Small rounded partially calcified mass again noted in the inferior aspect of the right sylvian fissure measuring 9.2 x 8.1 mm on 7:23, also visible on coronal reconstruction image 46. 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 There is no positive mass effect. Vascular: There are calcific plaques in the carotid siphons and both distal vertebral arteries. No hyperdense central vessel is seen.  Skull: Negative for fractures or focal lesions. Sinuses/Orbits: No acute findings. Old unilateral left lens replacement. 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 Other: None. CT CERVICAL SPINE FINDINGS Alignment: The patient's head is turned to the right. Again seen is a minimal grade 1 anterolisthesis at C2-3 and C4-5, mild reversal of the cervical lordosis, and chronic bone-on-bone anterior atlantodental joint space loss with osteophytes. C1 appears normally positioned on C2 considering the patient has had position. No new or traumatic alignment abnormality is suspected. Skull base and vertebrae: Osteopenia. No acute fracture is evident. No primary bone lesion or focal pathologic process. Soft tissues and spinal canal: No prevertebral fluid or swelling. No visible canal hematoma. The carotid bifurcations are heavily calcified. No laryngeal or thyroid mass. Disc levels: The discs are normal in heights at C2-3, C7-T1, referable collapsed at the former levels in between There are bidirectional endplate spurs most levels, with posterior disc osteophyte complexes deforming the ventral cord surface from C3-4 through C6-7 but without evidence of frank cord compression herniated discs. Facet joint and uncinate osteophytes are noted most levels. There is multilevel acquired foraminal stenosis which is greatest at C6-7 on the left-greater-than-right, and at C5-6 on the right. Upper chest: Lung apices are emphysematous. There is asymmetric coarse scar-like opacity in left apex. Bipolar pacemaker wiring partially visible. Aortic atherosclerosis. Other: None. IMPRESSION: 1. No acute intracranial CT findings or depressed skull fractures. 2. Atrophy, small-vessel disease and chronic lacunar infarcts. 3. 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  Further workup if clinically warranted could include CTA or MRI/MRA. 4. Osteopenia and degenerative change of the cervical spine without evidence of fractures. 5. Reversal of the cervical lordosis and minimal chronic grade 1 anterolisthesis at C2-3 and C4-5. 6. Aortic and carotid atherosclerosis. 7. Emphysema.  Asymmetric coarse scar-like opacity in the left apex. Aortic Atherosclerosis (ICD10-I70.0) and Emphysema (ICD10-J43.9). Electronically Signed   By: Almira Bar M.D.   On: 10/02/2023 03:19   CT Cervical Spine Wo Contrast  Result Date: 10/02/2023 CLINICAL DATA:  Mental status changes, unknown cause, neck trauma. EXAM: CT HEAD WITHOUT CONTRAST CT CERVICAL SPINE WITHOUT CONTRAST TECHNIQUE: Multidetector CT imaging of the head and cervical spine was performed following the standard protocol without intravenous contrast. Multiplanar CT image reconstructions of the cervical spine were also generated. RADIATION DOSE REDUCTION: This exam was performed according to the departmental dose-optimization program which includes automated exposure control, adjustment of the mA and/or kV according to patient size and/or use of iterative reconstruction technique. COMPARISON:  CT scan head and cervical spine both 07/28/2023. FINDINGS: CT HEAD FINDINGS Brain: There is  moderately advanced cerebral and mild cerebellar atrophy, moderate atrophic ventriculomegaly and advanced small vessel disease of the cerebral white matter. There are bilateral small chronic gangliocapsular lacunar infarcts. No new infarct is suspected, no hemorrhage, mass or mass effect and no midline shift. Small rounded partially calcified mass again noted in the inferior aspect of the right sylvian fissure measuring 9.2 x 8.1 mm on 7:23, also visible on coronal reconstruction image 46. 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 There is no positive mass  effect. Vascular: There are calcific plaques in the carotid siphons and both distal vertebral arteries. No hyperdense central vessel is seen. Skull: Negative for fractures or focal lesions. Sinuses/Orbits: No acute findings. Old unilateral left lens replacement. 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 Other: None. CT CERVICAL SPINE FINDINGS Alignment: The patient's head is turned to the right. Again seen is a minimal grade 1 anterolisthesis at C2-3 and C4-5, mild reversal of the cervical lordosis, and chronic bone-on-bone anterior atlantodental joint space loss with osteophytes. C1 appears normally positioned on C2 considering the patient has had position. No new or traumatic alignment abnormality is suspected. Skull base and vertebrae: Osteopenia. No acute fracture is evident. No primary bone lesion or focal pathologic process. Soft tissues and spinal canal: No prevertebral fluid or swelling. No visible canal hematoma. The carotid bifurcations are heavily calcified. No laryngeal or thyroid mass. Disc levels: The discs are normal in heights at C2-3, C7-T1, referable collapsed at the former levels in between There are bidirectional endplate spurs most levels, with posterior disc osteophyte complexes deforming the ventral cord surface from C3-4 through C6-7 but without evidence of frank cord compression herniated discs. Facet joint and uncinate osteophytes are noted most levels. There is multilevel acquired foraminal stenosis which is greatest at C6-7 on the left-greater-than-right, and at C5-6 on the right. Upper chest: Lung apices are emphysematous. There is asymmetric coarse scar-like opacity in left apex. Bipolar pacemaker wiring partially visible. Aortic atherosclerosis. Other: None. IMPRESSION: 1. No acute intracranial CT findings or depressed skull fractures. 2. Atrophy, small-vessel disease and chronic lacunar infarcts. 3. 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 Further workup if clinically warranted could include CTA or MRI/MRA. 4. Osteopenia and degenerative change of the cervical spine without evidence of fractures. 5. Reversal of the cervical lordosis and minimal chronic grade 1 anterolisthesis at C2-3 and C4-5. 6. Aortic and carotid atherosclerosis. 7. Emphysema.  Asymmetric coarse scar-like opacity in the left apex. Aortic Atherosclerosis (ICD10-I70.0) and Emphysema (ICD10-J43.9). Electronically Signed   By: Almira Bar M.D.   On: 10/02/2023 03:19   CT Renal Stone Study  Result Date: 10/02/2023 CLINICAL DATA:  Abdominal pain. EXAM: CT ABDOMEN AND PELVIS WITHOUT CONTRAST TECHNIQUE: Multidetector CT imaging of the abdomen and pelvis was performed following the standard protocol without IV contrast. RADIATION DOSE REDUCTION: This exam was performed according to the departmental dose-optimization program which includes automated exposure control, adjustment of the mA and/or kV according to patient size and/or use of iterative reconstruction technique. COMPARISON:  CT chest abdomen and pelvis 06/25/2023 FINDINGS: Lower chest: No acute abnormality. Hepatobiliary: The gallbladder is distended. Gallstones are present. There is no biliary ductal dilatation. No focal liver lesions are seen. Pancreas: Unremarkable. No pancreatic ductal dilatation  or surrounding inflammatory changes. Spleen: Normal in size without focal abnormality. Adrenals/Urinary Tract: Bilateral renal cysts are again seen. The largest is in the right kidney measuring up to 2.1 cm. There are no urinary tract calculi. There is no hydronephrosis. The adrenal glands and bladder are within normal limits. Stomach/Bowel: The proximal appendix appears within normal limits. The distal appendix is not well delineated in the pelvis. No focal inflammation, bowel  obstruction or free air identified. There is sigmoid colon diverticulosis. Vascular/Lymphatic: Aortic atherosclerosis. No enlarged abdominal or pelvic lymph nodes. Reproductive: Coarse rounded calcifications are seen in the pelvis which likely represent fibroids measuring up to 2.7 cm. The ovaries are not well delineated. Other: No significant free fluid or free air. No abdominal wall hernia. Small amount of subcutaneous air in the anterior lower right abdominal wall likely represents medication injection site. There some mild subcutaneous stranding in the right inguinal region and along the right lateral body wall. Musculoskeletal: Degenerative changes affect the hips and spine. IMPRESSION: 1. Distended gallbladder with gallstones. Correlate clinically for cholecystitis. 2. Colonic diverticulosis. 3. Uterine fibroids. 4. Mild subcutaneous stranding in the right inguinal region and along the right lateral body wall. Correlate clinically for cellulitis. 5. Right Bosniak I benign renal cyst measuring 2.1 cm. No follow-up imaging is recommended. JACR 2018 Feb; 264-273, Management of the Incidental Renal Mass on CT, [employer] 2021; [id number], Bosniak Classification of Cystic Renal Masses, Version 2019. Aortic Atherosclerosis (ICD10-I70.0). Electronically Signed   By: Darliss Cheney M.D.   On: 10/02/2023 01:58   DG Chest Port 1 View  Result Date: 10/01/2023 CLINICAL DATA:  Weakness EXAM: PORTABLE CHEST 1 VIEW COMPARISON:  07/28/2023 FINDINGS: Stable cardiomediastinal silhouette. Aortic atherosclerotic calcification. Left chest wall CRT-P. Similar interstitial opacities in the left mid and lower lung. No pleural effusion or pneumothorax. IMPRESSION: Similar interstitial opacities in the left mid and lower lung which may be chronic or due to atypical infection or aspiration. Electronically Signed   By: Minerva Fester M.D.   On: 10/01/2023 23:56       The results of significant diagnostics from this  hospitalization (including imaging, microbiology, ancillary and laboratory) are listed below for reference.     Microbiology: Recent Results (from the past 240 hour(s))  Urine Culture     Status: Abnormal   Collection Time: 10/01/23 10:31 PM   Specimen: Urine, Random  Result Value Ref Range Status   Specimen Description URINE, RANDOM  Final   Special Requests   Final    NONE Reflexed from [id number] [id number] Performed at [facility], [address] [place], [place] [address]    Culture MULTIPLE SPECIES PRESENT, SUGGEST RECOLLECTION (A)  Final   Report Status 10/02/2023 FINAL  Final  Blood culture (routine x 2)     Status: None   Collection Time: 10/01/23 11:21 PM   Specimen: BLOOD RIGHT HAND  Result Value Ref Range Status   Specimen Description BLOOD RIGHT HAND  Final   Special Requests   Final    BOTTLES DRAWN AEROBIC AND ANAEROBIC Blood Culture results may not be optimal due to an inadequate volume of blood received in culture bottles   Culture   Final    NO GROWTH 5 DAYS Performed at [facility], [address], [place], [place] [address]    Report Status 10/06/2023 FINAL  Final  Blood culture (routine x 2)     Status: Abnormal   Collection Time: 10/02/23 12:02 AM   Specimen: BLOOD RIGHT HAND  Result Value Ref Range Status   Specimen Description BLOOD RIGHT HAND  Final   Special Requests   Final    BOTTLES DRAWN AEROBIC AND ANAEROBIC Blood Culture adequate volume   Culture  Setup Time   Final    GRAM NEGATIVE RODS AEROBIC BOTTLE ONLY CRITICAL RESULT CALLED TO, READ BACK BY AND VERIFIED WITH: PHARMD G ABBOTT 10/03/23 @ 0423 BY AB Performed at [facility], [address] [place], [place] [address]    Culture (A)  Final    ESCHERICHIA COLI Confirmed Extended Spectrum Beta-Lactamase Producer (ESBL).  In bloodstream infections from ESBL organisms, carbapenems are preferred over piperacillin/tazobactam. They are shown to have a lower risk of mortality.    Report  Status 10/05/2023 FINAL  Final   Organism ID, Bacteria ESCHERICHIA COLI  Final   Organism ID, Bacteria ESCHERICHIA COLI  Final      Susceptibility   Escherichia coli - KIRBY BAUER*    CEFAZOLIN RESISTANT Resistant    Escherichia coli - MIC*    AMPICILLIN >=32 RESISTANT Resistant     CEFEPIME >=32 RESISTANT Resistant     CEFTAZIDIME >=64 RESISTANT Resistant     CEFTRIAXONE >=64 RESISTANT Resistant     CIPROFLOXACIN >=4 RESISTANT Resistant     GENTAMICIN <=1 SENSITIVE Sensitive     IMIPENEM <=0.25 SENSITIVE Sensitive     TRIMETH/SULFA >=320 RESISTANT Resistant     AMPICILLIN/SULBACTAM >=32 RESISTANT Resistant     PIP/TAZO 16 SENSITIVE Sensitive ug/mL    * ESCHERICHIA COLI    ESCHERICHIA COLI  Blood Culture ID Panel (Reflexed)     Status: Abnormal   Collection Time: 10/02/23 12:02 AM  Result Value Ref Range Status   Enterococcus faecalis NOT DETECTED NOT DETECTED Final   Enterococcus Faecium NOT DETECTED NOT DETECTED Final   Listeria monocytogenes NOT DETECTED NOT DETECTED Final   Staphylococcus species NOT DETECTED NOT DETECTED Final   Staphylococcus aureus (BCID) NOT DETECTED NOT DETECTED Final   Staphylococcus epidermidis NOT DETECTED NOT DETECTED Final   Staphylococcus lugdunensis NOT DETECTED NOT DETECTED Final   Streptococcus species NOT DETECTED NOT DETECTED Final   Streptococcus agalactiae NOT DETECTED NOT DETECTED Final   Streptococcus pneumoniae NOT DETECTED NOT DETECTED Final   Streptococcus pyogenes NOT DETECTED NOT DETECTED Final   A.calcoaceticus-baumannii NOT DETECTED NOT DETECTED Final   Bacteroides fragilis NOT DETECTED NOT DETECTED Final   Enterobacterales DETECTED (A) NOT DETECTED Final    Comment: Enterobacterales represent a large order of gram negative bacteria, not a single organism. CRITICAL RESULT CALLED TO, READ BACK BY AND VERIFIED WITH: PHARMD G ABBOTT 10/03/23 @ 0423 BY AB    Enterobacter cloacae complex NOT DETECTED NOT DETECTED Final   Escherichia  coli DETECTED (A) NOT DETECTED Final    Comment: CRITICAL RESULT CALLED TO, READ BACK BY AND VERIFIED WITH: PHARMD G ABBOTT 10/03/23 @ 0423 BY AB    Klebsiella aerogenes NOT DETECTED NOT DETECTED Final   Klebsiella oxytoca NOT DETECTED NOT DETECTED Final   Klebsiella pneumoniae NOT DETECTED NOT DETECTED Final   Proteus species NOT DETECTED NOT DETECTED Final   Salmonella species NOT DETECTED NOT DETECTED Final   Serratia marcescens NOT DETECTED NOT DETECTED Final   Haemophilus influenzae NOT DETECTED NOT DETECTED Final   Neisseria meningitidis NOT DETECTED NOT DETECTED Final   Pseudomonas aeruginosa NOT DETECTED NOT DETECTED Final   Stenotrophomonas maltophilia NOT DETECTED NOT DETECTED Final   Candida albicans NOT DETECTED NOT DETECTED Final   Candida auris NOT  DETECTED NOT DETECTED Final   Candida glabrata NOT DETECTED NOT DETECTED Final   Candida krusei NOT DETECTED NOT DETECTED Final   Candida parapsilosis NOT DETECTED NOT DETECTED Final   Candida tropicalis NOT DETECTED NOT DETECTED Final   Cryptococcus neoformans/gattii NOT DETECTED NOT DETECTED Final   CTX-M ESBL DETECTED (A) NOT DETECTED Final    Comment: CRITICAL RESULT CALLED TO, READ BACK BY AND VERIFIED WITH: PHARMD G ABBOTT 10/03/23 @ 0423 BY AB (NOTE) Extended spectrum beta-lactamase detected. Recommend a carbapenem as initial therapy.      Carbapenem resistance IMP NOT DETECTED NOT DETECTED Final   Carbapenem resistance KPC NOT DETECTED NOT DETECTED Final   Carbapenem resistance NDM NOT DETECTED NOT DETECTED Final   Carbapenem resist OXA 48 LIKE NOT DETECTED NOT DETECTED Final   Carbapenem resistance VIM NOT DETECTED NOT DETECTED Final    Comment: Performed at [facility], [address] [place], [place] [address]     Labs:  CBC: Recent Labs  Lab 10/01/23 2247 10/02/23 0731 10/03/23 0423 10/04/23 0551  WBC 14.5* 19.7* 19.0* 14.6*  HGB 13.2 11.3* 11.6* 11.9*  HCT 47.4* 35.2* 37.4 37.8  MCV  100.9* 90.5 90.3 90.2  PLT 291 263 260 276   BMP &GFR Recent Labs  Lab 10/02/23 1006 10/02/23 1552 10/02/23 2114 10/03/23 0423 10/04/23 0551  NA 164* 161* 157* 151* 157*  K 4.3 4.8 3.8 3.4* 4.3  CL 125* 125* 121* 119* 124*  CO2 20* 19* 21* 20* 19*  GLUCOSE 160* 159* 182* 180* 101*  BUN 138* 132* 122* 108* 99*  CREATININE 7.35* 6.90* 6.36* 6.01* 5.45*  CALCIUM 8.8* 8.7* 8.9 8.4* 9.3  MG  --   --   --   --  2.6*   Estimated Creatinine Clearance: 6.3 mL/min (A) (by C-G formula based on SCr of 5.45 mg/dL (H)). Liver & Pancreas: Recent Labs  Lab 10/02/23 0004 10/04/23 0551  AST 19 19  ALT 11 11  ALKPHOS 89 76  BILITOT 0.8 0.5  PROT 8.6* 7.6  ALBUMIN 3.0* 2.5*   No results for input(s): "LIPASE", "AMYLASE" in the last 168 hours. No results for input(s): "AMMONIA" in the last 168 hours. Diabetic: No results for input(s): "HGBA1C" in the last 72 hours. No results for input(s): "GLUCAP" in the last 168 hours. Cardiac Enzymes: No results for input(s): "CKTOTAL", "CKMB", "CKMBINDEX", "TROPONINI" in the last 168 hours. No results for input(s): "PROBNP" in the last 8760 hours. Coagulation Profile: No results for input(s): "INR", "PROTIME" in the last 168 hours. Thyroid Function Tests: No results for input(s): "TSH", "T4TOTAL", "FREET4", "T3FREE", "THYROIDAB" in the last 72 hours. Lipid Profile: No results for input(s): "CHOL", "HDL", "LDLCALC", "TRIG", "CHOLHDL", "LDLDIRECT" in the last 72 hours. Anemia Panel: No results for input(s): "VITAMINB12", "FOLATE", "FERRITIN", "TIBC", "IRON", "RETICCTPCT" in the last 72 hours. Urine analysis:    Component Value Date/Time   COLORURINE YELLOW 10/01/2023 2231   APPEARANCEUR TURBID (A) 10/01/2023 2231   LABSPEC 1.013 10/01/2023 2231   PHURINE 5.0 10/01/2023 2231   GLUCOSEU NEGATIVE 10/01/2023 2231   HGBUR MODERATE (A) 10/01/2023 2231   BILIRUBINUR NEGATIVE 10/01/2023 2231   KETONESUR NEGATIVE 10/01/2023 2231   PROTEINUR 100 (A)  10/01/2023 2231   UROBILINOGEN 0.2 04/27/2015 0834   NITRITE NEGATIVE 10/01/2023 2231   LEUKOCYTESUR MODERATE (A) 10/01/2023 2231   Sepsis Labs: Invalid input(s): "PROCALCITONIN", "LACTICIDVEN"   SIGNED:  Almon Hercules, MD  [facility] 10/06/2023, 2:00 PM

## 2023-10-06 NOTE — Progress Notes (Addendum)
Daily Progress Note   Patient Name: Betty [name]       Date: 10/06/2023 DOB: 1946/01/25  Age: 77 y.o. MRN#: [id number] Attending Physician: Almon Hercules, MD Primary Care Physician: Collene Mares, [place] Admit Date: 10/01/2023  Reason for Consultation/Follow-up: Establishing goals of care  Length of Stay: 4  Current Medications: Scheduled Meds:    Continuous Infusions:   PRN Meds: acetaminophen **OR** acetaminophen, antiseptic oral rinse, glycopyrrolate **OR** glycopyrrolate **OR** glycopyrrolate, haloperidol **OR** haloperidol **OR** haloperidol lactate, HYDROmorphone (DILAUDID) injection, LORazepam **OR** LORazepam **OR** LORazepam, ondansetron **OR** ondansetron (ZOFRAN) IV, polyvinyl alcohol  Physical Exam Constitutional:      General: She is sleeping.     Appearance: She is ill-appearing.             Vital Signs: BP (!) 147/96 (BP Location: Left Arm)   Pulse (!) 128   Temp 97.7 F (36.5 C) (Oral)   Resp 16   Ht 5\' 4"  (1.626 m)   Wt 46.8 kg   SpO2 100%   BMI 17.71 kg/m  SpO2: SpO2: 100 % O2 Device: O2 Device: Room Air O2 Flow Rate:      Palliative Assessment/Data: 10 - 20%      Patient Active Problem List   Diagnosis Date Noted   End of life care 10/04/2023   Acute renal failure superimposed on stage 4 chronic kidney disease (HCC) 10/02/2023   Hypernatremia 10/02/2023   UTI (urinary tract infection) 10/02/2023   Acute encephalopathy 10/02/2023   COPD (chronic obstructive pulmonary disease) (HCC)    Fall at home, initial encounter 07/29/2023   Community acquired pneumonia of left lower lobe of lung 07/29/2023   Fall 07/29/2023   Pneumonia 07/28/2023   Perforated appendicitis 06/25/2023   Acute metabolic encephalopathy 06/04/2023   Abdominal pain  06/04/2023   Abdominal mass 06/04/2023   Allergic rhinitis 12/14/2021   Bilateral pleural effusion 12/14/2021   Cardiac pacemaker in situ 12/14/2021   Coronary artery disease 12/14/2021   Esophagitis, unspecified with bleeding 12/14/2021   Gastro-esophageal reflux disease without esophagitis 12/14/2021   Gout 12/14/2021   Hardening of the aorta (main artery of the heart) (HCC) 12/14/2021   Left knee pain 12/14/2021   Malignant neoplasm of lower lobe, unspecified bronchus or lung (HCC) 12/14/2021   Mixed hyperlipidemia 12/14/2021   Multi-infarct dementia,  uncomplicated (HCC) 12/14/2021   History of colonic polyps 12/14/2021   Primary localized osteoarthritis of pelvic region and thigh 12/14/2021   Tobacco user 12/14/2021   Vitamin D deficiency 12/14/2021   CKD (chronic kidney disease) stage 5, GFR less than 15 ml/min (HCC) 11/14/2021   GI bleed 06/24/2019   Hypotension 06/24/2019   Electrolyte abnormality 06/24/2019   Pressure injury of skin 05/29/2019   Altered mental status    Acute respiratory failure with hypoxemia (HCC)    Essential hypertension 05/14/2019   Dementia (HCC) 07/16/2018   Cerebral aneurysm, nonruptured 07/16/2018   Nonischemic cardiomyopathy (HCC) 05/21/2018   Congestive heart failure, NYHA class 3, chronic, systolic (HCC) 05/21/2018   Chronic systolic heart failure (HCC)    Chest pain 04/22/2018   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   NSTEMI (non-ST elevated myocardial infarction) (HCC) 03/24/2018   Pure hypercholesterolemia    CKD (chronic kidney disease), stage IV (HCC)    Non-small cell carcinoma of right lung, stage 1 (HCC) 05/17/2015   S/P lobectomy of lung 04/29/2015    Palliative Care Assessment & Plan   Patient Profile: 77 y.o. female  with past medical history of dementia, COPD, HFrEF, CKD stage IV, CAD, and lung cancer who presented to the ED from SNF on 10/01/2023 with altered mental status and abnormal labs including severe  and hyponatremia and AKI.  No acute findings noted on head CT or cervical spine CT.  No hydronephrosis is seen on CT of the abdomen and pelvis.  Patient is admitted with acute renal failure superimposed on CKD 4, hypernatremia likely due to dehydration and poor p.o. intake, and probable UTI.  Today's Discussion Visited patient this morning she looked uncomfortable and was moaning. Notified nursing staff and dilaudid administered. She did not require PRN medication overnight. She was evaluated for [facility] yesterday and accepted but they are waiting for a bed.  1045 called son [name] to give him an update. Shared with him his mother is requiring prn medications today. Answered questions about the pain/ symptom evaluation process for end of life.   Encouraged [name] to call PMT with questions or concerns.  Recommendations/Plan: Full comfort care Accepted to [facility]---  waiting for bed availability. Continued PMT support  Goals of Care and Additional Recommendations: Limitations on Scope of Treatment: Full Comfort Care  Code Status:    Code Status Orders  (From admission, onward)           Start     Ordered   10/04/23 1301  Do not attempt resuscitation (DNR) - Comfort care  Continuous       Question Answer Comment  If patient has no pulse and is not breathing Do Not Attempt Resuscitation   In Pre-Arrest Conditions (Patient Is Breathing and Has a Pulse) Provide comfort measures. Relieve any mechanical airway obstruction. Avoid transfer unless required for comfort.   Consent: Discussion documented in EHR or advanced directives reviewed      10/04/23 1301         Extensive chart review has been completed prior to seeing the patient including vital signs, progress/consult notes, orders, medications, and available advance directive documents.  Prognosis:  < 2 weeks  Discharge Planning: To Be Determined  Discussed with [facility] liaison   Time spent: 25 minutes  Thank  you for allowing the Palliative Medicine Team to assist in the care of this patient.     Sherryll Burger, NP  Please contact Palliative Medicine Team phone at [phone] for  questions and concerns.

## 2023-10-06 NOTE — Plan of Care (Signed)
  Problem: Education: Goal: Knowledge of General Education information will improve Description: Including pain rating scale, medication(s)/side effects and non-pharmacologic comfort measures Outcome: Not Progressing   Problem: Health Behavior/Discharge Planning: Goal: Ability to manage health-related needs will improve Outcome: Not Progressing   Problem: Clinical Measurements: Goal: Ability to maintain clinical measurements within normal limits will improve Outcome: Not Progressing Goal: Will remain free from infection Outcome: Not Progressing Goal: Diagnostic test results will improve Outcome: Not Progressing Goal: Respiratory complications will improve Outcome: Not Progressing Goal: Cardiovascular complication will be avoided Outcome: Not Progressing   Problem: Activity: Goal: Risk for activity intolerance will decrease Outcome: Not Progressing   Problem: Nutrition: Goal: Adequate nutrition will be maintained Outcome: Not Progressing   Problem: Coping: Goal: Level of anxiety will decrease Outcome: Not Progressing   Problem: Elimination: Goal: Will not experience complications related to bowel motility Outcome: Not Progressing Goal: Will not experience complications related to urinary retention Outcome: Not Progressing   Problem: Pain Management: Goal: General experience of comfort will improve Outcome: Not Progressing   Problem: Safety: Goal: Ability to remain free from injury will improve Outcome: Not Progressing   Problem: Skin Integrity: Goal: Risk for impaired skin integrity will decrease Outcome: Not Progressing   Problem: Education: Goal: Knowledge of the prescribed therapeutic regimen will improve Outcome: Not Progressing   Problem: Coping: Goal: Ability to identify and develop effective coping behavior will improve Outcome: Not Progressing   Problem: Clinical Measurements: Goal: Quality of life will improve Outcome: Not Progressing   Problem:  Respiratory: Goal: Verbalizations of increased ease of respirations will increase Outcome: Not Progressing   Problem: Role Relationship: Goal: Family's ability to cope with current situation will improve Outcome: Not Progressing Goal: Ability to verbalize concerns, feelings, and thoughts to partner or family member will improve Outcome: Not Progressing   Problem: Pain Management: Goal: Satisfaction with pain management regimen will improve Outcome: Not Progressing

## 2023-10-06 NOTE — TOC Transition Note (Signed)
Transition of Care [facility]) - CM/SW Discharge Note   Patient Details  Name: Betty [name] MRN: [id number] Date of Birth: 08-Jun-1946  Transition of Care [facility]) CM/SW Contact:  Deatra Robinson, [place] Phone Number: 10/06/2023, 2:33 PM   Clinical Narrative: pt has been accepted to [facility] and they are prepared to admit today. Pt's family agreeable to dc and have completed admission consents with [facility]. RN provided with number for report and PTAR arranged for transport. SW signing off at dc.   Dellie Burns, MSW, LCSW [phone] (coverage)        Final next level of care: Hospice Medical Facility Barriers to Discharge: Barriers Resolved   Patient Goals and CMS Choice      Discharge Placement                  Patient to be transferred to facility by: PTAR Name of family member notified: [name]/son Patient and family notified of of transfer: 10/06/23  Discharge Plan and Services Additional resources added to the After Visit Summary for                                       Social Determinants of Health (SDOH) Interventions SDOH Screenings   Food Insecurity: No Food Insecurity (10/02/2023)  Housing: Low Risk  (10/02/2023)  Transportation Needs: No Transportation Needs (10/02/2023)  Utilities: Not At Risk (10/02/2023)  Alcohol Screen: Low Risk  (11/28/2021)  Depression (PHQ2-9): Low Risk  (11/28/2021)  Financial Resource Strain: Low Risk  (11/28/2021)  Physical Activity: Inactive (11/28/2021)  Social Connections: Unknown (11/28/2021)  Stress: No Stress Concern Present (11/28/2021)  Tobacco Use: Medium Risk (10/02/2023)     Readmission Risk Interventions    10/04/2023    2:36 PM  Readmission Risk Prevention Plan  Transportation Screening Complete  Medication Review (RN Care Manager) Complete  PCP or Specialist appointment within 3-5 days of discharge Complete  HRI or Home Care Consult Complete  SW Recovery Care/Counseling Consult Complete   Palliative Care Screening Complete  Skilled Nursing Facility Complete

## 2023-10-06 NOTE — Progress Notes (Signed)
[facility] Liaison Note  Patient has been approved for bed at [facility] and bed is confirmed available today.  Patient's son, [name], made aware and would like to proceed with transport as soon as consents are completed.  TOC and MD aware of updates.  RN, please send signed and completed DNR with patient at discharge.  Please leave IV's in for ongoing symptom management needs.  Please call report to [facility] at [phone] prior to patient leaving unit.  Please call with any hospice related questions or concerns.  Doreatha Martin, RN, [facility] [phone]

## 2023-10-08 ENCOUNTER — Ambulatory Visit: Payer: Medicare Other | Admitting: Podiatry

## 2023-11-06 DEATH — deceased
